# Patient Record
Sex: Male | Born: 1945
Health system: Southern US, Community
[De-identification: ages and names within clinical notes are randomized; demographics above are authoritative.]

## PROBLEM LIST (undated history)

## (undated) DIAGNOSIS — M1990 Unspecified osteoarthritis, unspecified site: Secondary | ICD-10-CM

## (undated) DIAGNOSIS — E785 Hyperlipidemia, unspecified: Secondary | ICD-10-CM

## (undated) DIAGNOSIS — C7800 Secondary malignant neoplasm of unspecified lung: Secondary | ICD-10-CM

## (undated) DIAGNOSIS — C8590 Non-Hodgkin lymphoma, unspecified, unspecified site: Principal | ICD-10-CM

## (undated) DIAGNOSIS — N400 Enlarged prostate without lower urinary tract symptoms: Secondary | ICD-10-CM

## (undated) DIAGNOSIS — H409 Unspecified glaucoma: Secondary | ICD-10-CM

## (undated) DIAGNOSIS — I309 Acute pericarditis, unspecified: Secondary | ICD-10-CM

## (undated) DIAGNOSIS — I471 Supraventricular tachycardia, unspecified: Secondary | ICD-10-CM

## (undated) DIAGNOSIS — K5792 Diverticulitis of intestine, part unspecified, without perforation or abscess without bleeding: Secondary | ICD-10-CM

## (undated) DIAGNOSIS — K219 Gastro-esophageal reflux disease without esophagitis: Secondary | ICD-10-CM

## (undated) DIAGNOSIS — C4492 Squamous cell carcinoma of skin, unspecified: Secondary | ICD-10-CM

## (undated) DIAGNOSIS — H269 Unspecified cataract: Secondary | ICD-10-CM

## (undated) DIAGNOSIS — C779 Secondary and unspecified malignant neoplasm of lymph node, unspecified: Secondary | ICD-10-CM

## (undated) DIAGNOSIS — J439 Emphysema, unspecified: Secondary | ICD-10-CM

## (undated) DIAGNOSIS — I499 Cardiac arrhythmia, unspecified: Secondary | ICD-10-CM

## (undated) DIAGNOSIS — C8510 Unspecified B-cell lymphoma, unspecified site: Secondary | ICD-10-CM

## (undated) DIAGNOSIS — M25559 Pain in unspecified hip: Secondary | ICD-10-CM

## (undated) DIAGNOSIS — I4891 Unspecified atrial fibrillation: Secondary | ICD-10-CM

## (undated) HISTORY — PX: CHOLECYSTECTOMY: SHX55

## (undated) HISTORY — DX: Squamous cell carcinoma of skin, unspecified: C44.92

## (undated) HISTORY — DX: Hyperlipidemia, unspecified: E78.5

## (undated) HISTORY — DX: Gastro-esophageal reflux disease without esophagitis: K21.9

## (undated) HISTORY — DX: Pain in unspecified hip: M25.559

## (undated) HISTORY — PX: EXPLORATORY LAPAROTOMY: SUR591

## (undated) HISTORY — DX: Emphysema, unspecified: J43.9

## (undated) HISTORY — PX: VEIN LIGATION AND STRIPPING: SHX2653

## (undated) HISTORY — PX: TONSILLECTOMY: SUR1361

## (undated) HISTORY — PX: LYMPH NODE BIOPSY: SHX201

## (undated) HISTORY — DX: Unspecified cataract: H26.9

## (undated) HISTORY — PX: LUNG LOBECTOMY: SHX167

## (undated) HISTORY — DX: Unspecified glaucoma: H40.9

## (undated) HISTORY — PX: PROSTATE SURGERY: SHX751

## (undated) HISTORY — DX: Unspecified B-cell lymphoma, unspecified site: C85.10

## (undated) HISTORY — PX: MENISCUS REPAIR: SHX5179

## (undated) HISTORY — DX: Benign prostatic hyperplasia without lower urinary tract symptoms: N40.0

## (undated) HISTORY — PX: REFRACTIVE SURGERY: SHX103

## (undated) HISTORY — DX: Diverticulitis of intestine, part unspecified, without perforation or abscess without bleeding: K57.92

## (undated) HISTORY — DX: Non-Hodgkin lymphoma, unspecified, unspecified site: C85.90

---

## 2003-11-22 ENCOUNTER — Ambulatory Visit (HOSPITAL_COMMUNITY): Admission: RE | Admit: 2003-11-22 | Discharge: 2003-11-22 | Payer: Self-pay | Admitting: Oncology

## 2003-11-22 IMAGING — PT NM PET TUM IMG SKULL BASE T - THIGH
1 of 3 series · 1 of 25 positions shown · non-contrast
Comparison: none

CLINICAL DATA: Lymphoma.  Please reevaluate.
FDG PET-CT TUMOR IMAGING (SKULL BASE TO THIGHS)
TECHNIQUE: 16 mCi F-18 FDG were administered via right antecubital fossa.  Full-ring PET imaging was performed from the skull base through the mid-thighs 55 minutes after injection.  CT data was obtained and used for attenuation correction and anatomic localization only.  (This was not acquired as a diagnostic CT examination.)

[Series 2: ct images · axial · 3.8mm · 0.98mm/px · 1 of 267 slices shown]
[im 267/267  brain]
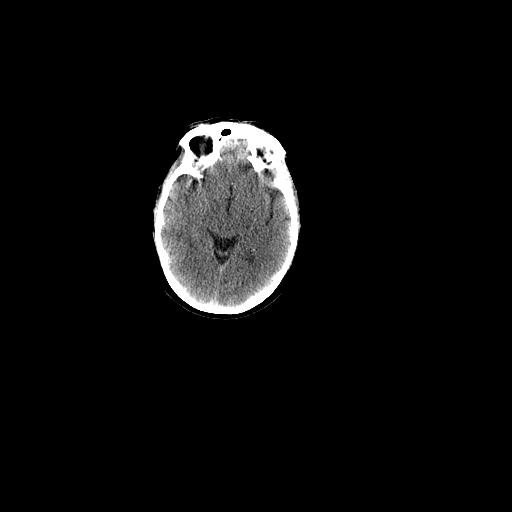

[1 of 25 positions shown; findings below may reference images not displayed]

FINDINGS: There is a focal area of increased FDG uptake seen associated with the left parotid gland within its medial posterior portion (axial image #20).  This is associated with a maximum SUV of 9.3 g/mL.  This may be secondary to an intraparotid lymph node with lymphomatous involvement or could be secondary to a primary parotid tumor.  Intraparotid lymph nodes are not uncommon, and given the patient?s history I would favor a lymphoma associated with an intraparotid lymph node.  There is increased FDG uptake within enlarged left axillary lymph nodes and also a focal area of increased FDG uptake within the posterolateral right chest (image #126).  This is in the area of the posterolateral right ninth rib and most likely represents  involvement of the rib (if the patient has no history of recent trauma in this region).  There is a hiatal hernia noted.   There is also increased FDG uptake within preaortic, retroperitoneal, bilateral common iliac, left external iliac, and left inguinal lymph nodes.  These are all consistent with lymphoma.  The largest lymph nodes are within the left inguinal region with a maximum SUV of 20 g/mL.  The retroperitoneal lymph nodes are associated with an SUV of approximately 7 g/mL.  The iliac chain lymph nodes are associated with an SUV of 5 g/mL.
IMPRESSION: 1.  Areas of increased FDG uptake associated with the left axillary, retroperitoneal, bilateral iliac chain, and left inguinal lymph nodes consistent with lymphoma.  
2.  Focal area of increased uptake associated with the left parotid gland which may be secondary to lymphoma focally involving an intraparotid lymph node or a primary parotid tumor.
3.  Focally increased FDG uptake associated with right posterolateral chest in the area of the right ninth rib.  If the patient has no history of trauma in this region, most likely this represents lymphomatous involvement of the rib.

## 2003-11-22 IMAGING — CT CT ABDOMEN W/ CM
1 of 4 series · 12 of 32 positions shown, 17 images · IV contrast (omnipaque)
Comparison: none

CLINICAL DATA: Lymphoma.
CT CHEST WITH IV CONTRAST:
Multidetector helical study performed during IV contrast enhancement with 150 cc of Omnipaque 300.  
There are multiple mildly enlarged left axillary lymph nodes present.  The largest lymph node measures 21 x 12 mm in size on image #15 and corresponds to the area of increased FDG uptake noted on the recent PET scan.  There is a slightly prominent aorticopulmonary window mediastinal lymph node which measures 15 x 10 mm in size on image #27.  This is not associated with abnormal FDG uptake on the recent PET scan.  There are no parenchymal pulmonary nodules or infiltrates, and there are no pleural effusions.  The abnormal FDG uptake associated with the posterolateral right chest is not associated with a CT scan abnormality in this region.  There is a moderate-sized hiatal hernia noted.  There is a 21 mm in size low density structure seen projecting to the right of the lower thoracic spine at the T10-11 level.  This measures 15 + / -8 Hounsfield units in density (water range).  This is unchanged from the previous CT performed at [HOSPITAL] [HOSPITAL] [DATE] and would be most consistent with an incidental lateral meningocele.  This is not associated with abnormal FDG uptake.

[Series 2: cap w/iv 5.0 b30f · axial · 0.74mm/px · z∈[+1271,+1876]mm · 12 of 139 slices shown, 17 images]
[im 9/139  soft-tissue]
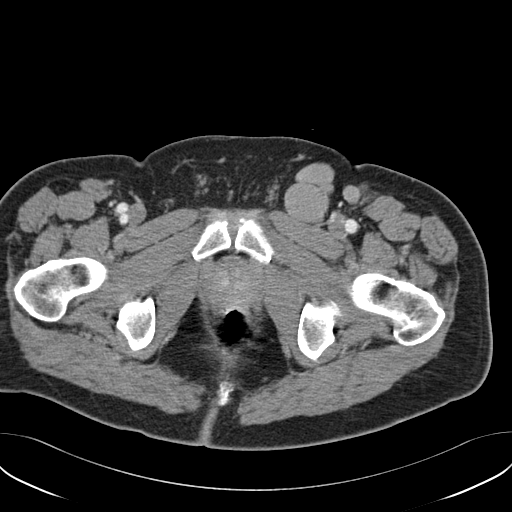
[im 9/139  bone]
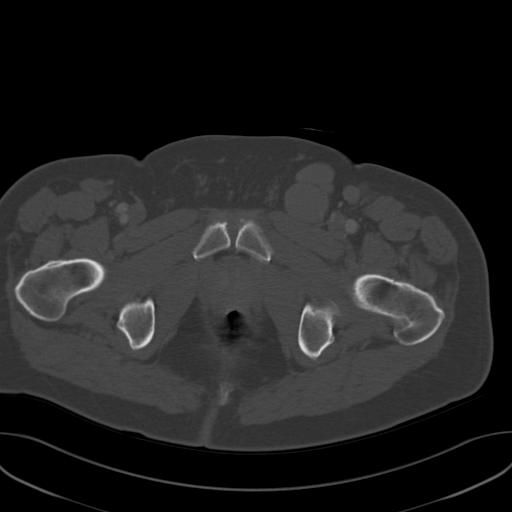
[im 26/139  soft-tissue]
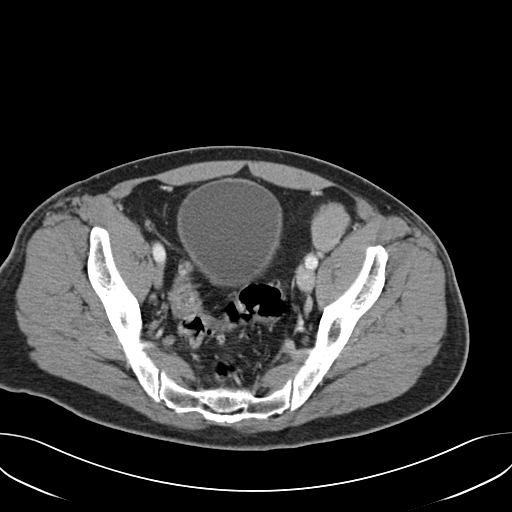
[im 35/139  soft-tissue]
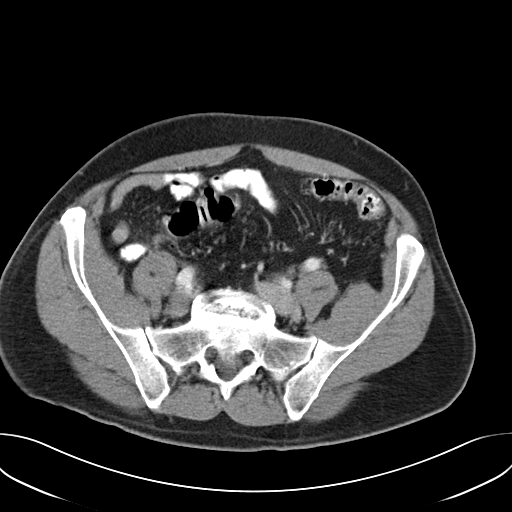
[im 44/139  soft-tissue]
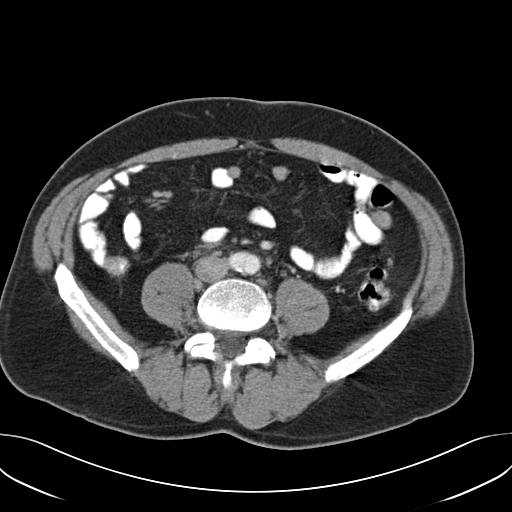
[im 61/139  soft-tissue]
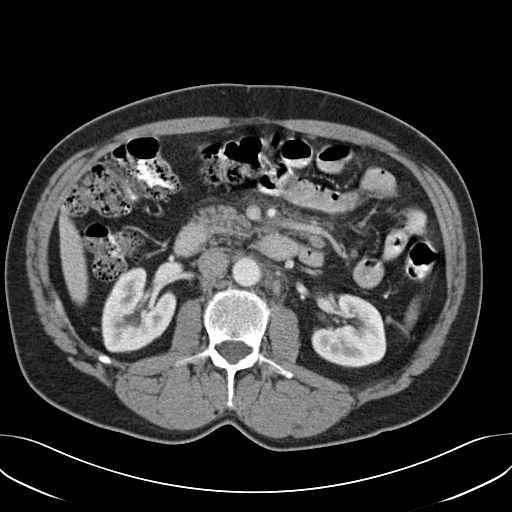
[im 70/139  soft-tissue]
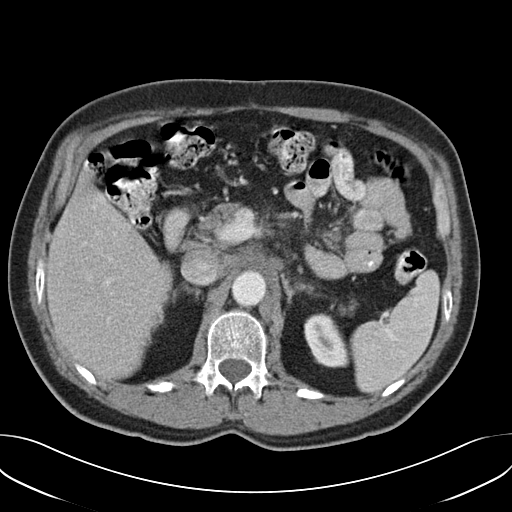
[im 78/139  soft-tissue]
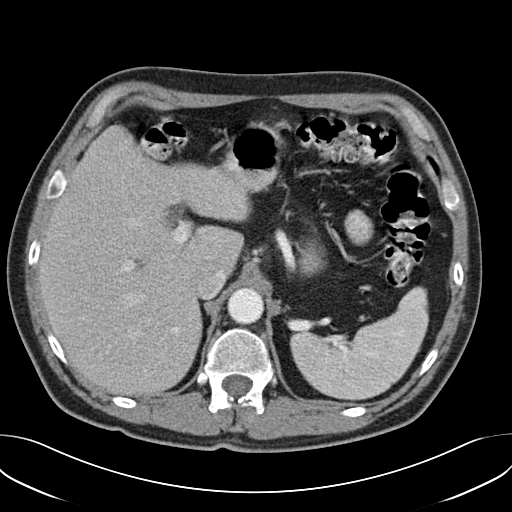
[im 95/139  soft-tissue]
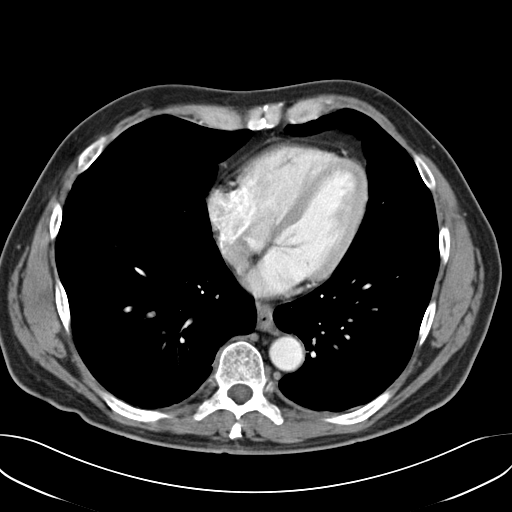
[im 104/139  soft-tissue]
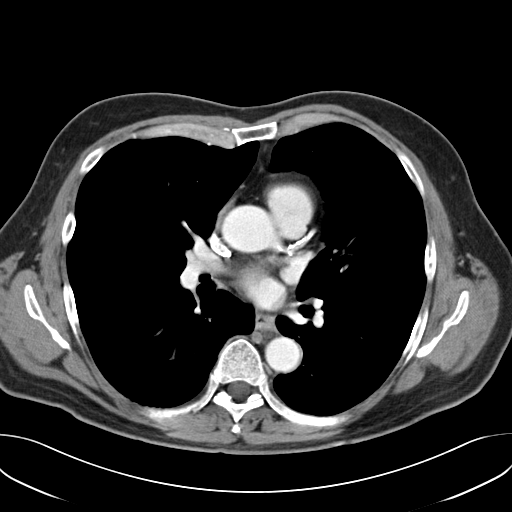
[im 104/139  lung]
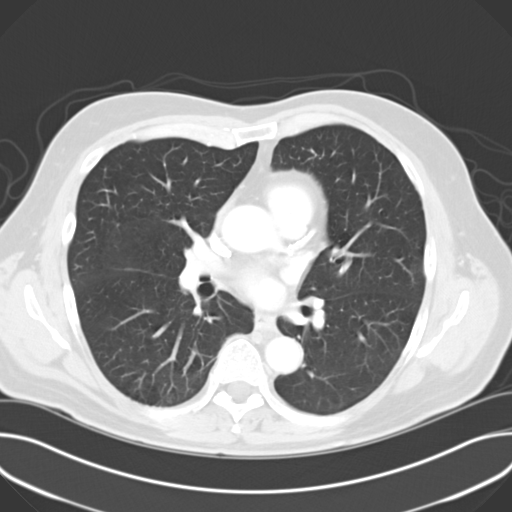
[im 104/139  bone]
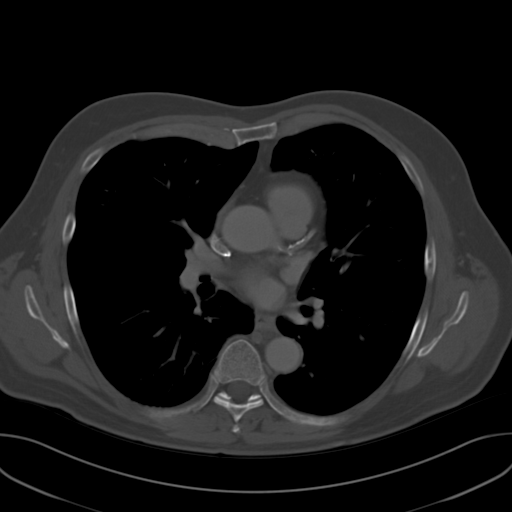
[im 113/139  soft-tissue]
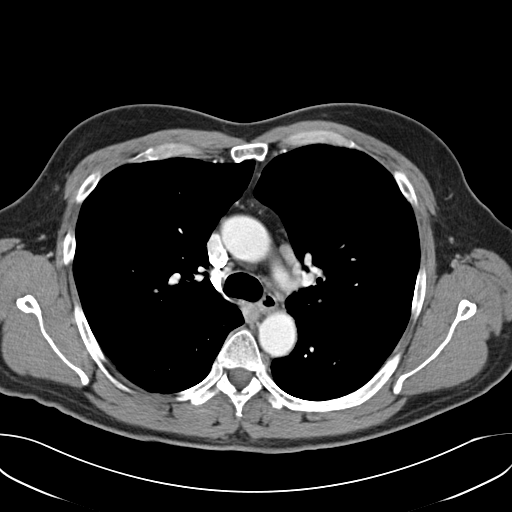
[im 113/139  lung]
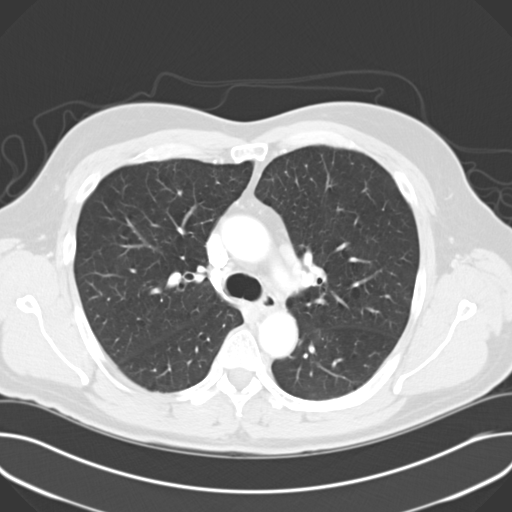
[im 121/139  lung]
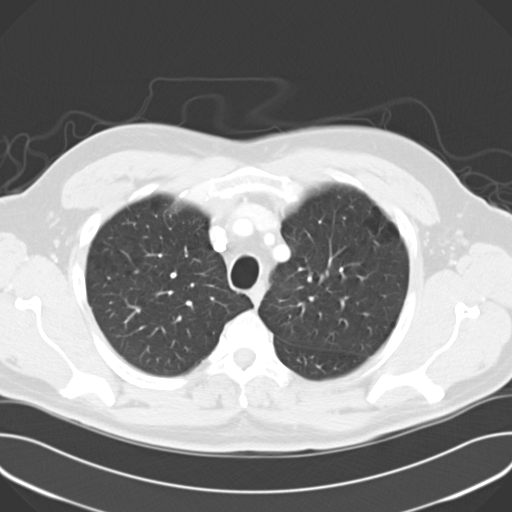
[im 130/139  soft-tissue]
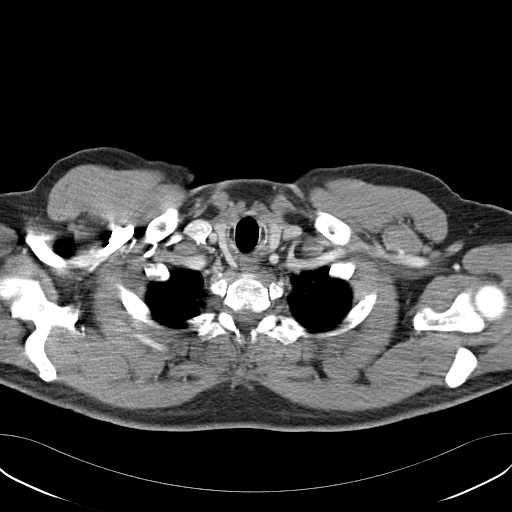
[im 130/139  lung]
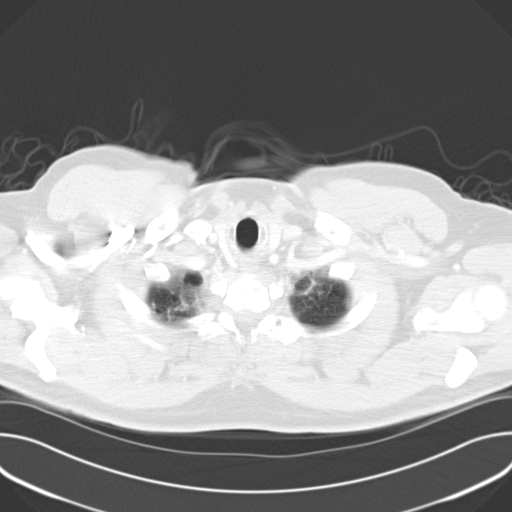

[12 of 32 positions shown; findings below may reference images not displayed]

IMPRESSION: 1.  Enlarged left axillary lymph nodes with FDG activity noted on the recent PET scan consistent with lymphoma.  Mildly prominent aorticopulmonary window mediastinal lymph node is not associated with abnormal FDG uptake.  
2.  Hiatal hernia. 
3.  21 mm in size water-density structure projecting to the right of the lower thoracic spine most consistent with an incidental lateral meningocele.  
CT ABDOMEN WITH IV CONTRAST:
There is a 2.6 x 3.5 cm in size soft tissue mass seen anterior to the aorta and located inferior to the celiac artery.  In correlating this with the PET scan, this area does not avidly take up the FDG, and this has a stable appearance when compared with the [DATE] CT from [HOSPITAL].  There is FDG activity present associated with a lymph node interposed between the inferior vena cava and portal vein (periportal lymph node).  This blends with the medial border of the inferior vena cava, but measures approximately 16 mm in AP dimension.  There are several small cysts seen associated with the left lobe of the liver, and the liver otherwise has a normal appearance.  The spleen, pancreas, kidneys, and adrenal glands have a normal appearance.  There are prominent retroperitoneal lymph nodes present with ill-definition of the retroperitoneal fat.  However, these areas are relatively stable in appearance when compared with the prior study from [HOSPITAL].
IMPRESSION: 1.  Soft tissue density interposed between the portal vein and inferior vena cava and slightly medial inferior vena cava consistent with recurrent adenopathy in this region (when correlated with the recent PET scan).  Soft tissue density seen anterior to the aorta and inferior to the celiac artery appears relatively stable when compared with the prior CT scan.  Also, the retroperitoneal adenopathy and ill-definition of the retroperitoneal fat is of similar appearance.  
2.  Hiatal hernia.
CT PELVIS WITH IV CONTRAST:
There are multiple markedly enlarged left inguinal lymph nodes.  These correspond to the areas of intense FDG uptake noted on the recent PET scan.  This is associated as well with left external iliac adenopathy, and there are mildly enlarged lymph nodes seen adjacent to the common iliac arteries bilaterally.  Two enlarged lymph nodes blend with one another within the left inguinal region anteriorly, and on image #126 this area of adenopathy measures 5 cm in transverse dimension and 3.1 cm in AP dimension.  This does represent a significant interval worsening when compared with the prior study from [HOSPITAL].  A 1.4 cm in diameter left common iliac chain lymph node is noted which is associated with increased FDG uptake on the PET scan.
IMPRESSION: Bilateral common iliac, left external iliac, and left inguinal adenopathy with progression when compared with the previous CT scan from [HOSPITAL].

## 2003-12-10 ENCOUNTER — Encounter (INDEPENDENT_AMBULATORY_CARE_PROVIDER_SITE_OTHER): Payer: Self-pay | Admitting: General Surgery

## 2003-12-10 ENCOUNTER — Ambulatory Visit (HOSPITAL_COMMUNITY): Admission: RE | Admit: 2003-12-10 | Discharge: 2003-12-10 | Payer: Self-pay | Admitting: General Surgery

## 2003-12-10 ENCOUNTER — Encounter (INDEPENDENT_AMBULATORY_CARE_PROVIDER_SITE_OTHER): Payer: Self-pay | Admitting: Specialist

## 2003-12-19 ENCOUNTER — Ambulatory Visit (HOSPITAL_COMMUNITY): Admission: RE | Admit: 2003-12-19 | Discharge: 2003-12-19 | Payer: Self-pay | Admitting: Oncology

## 2003-12-31 ENCOUNTER — Ambulatory Visit (HOSPITAL_COMMUNITY): Admission: RE | Admit: 2003-12-31 | Discharge: 2003-12-31 | Payer: Self-pay | Admitting: Oncology

## 2003-12-31 IMAGING — XA IR CV CATH FLUORO GUIDE
1 series · 2 of 2 positions shown · non-contrast
Comparison: none

CLINICAL DATA: 58 year-old male with lymphoma
ULTRASOUND AND FLUOROSCOPICALLY GUIDED RIGHT IJ SINGLE LUMEN PORT-A-CATHETER:
Radiologist:  HANNON, M.D.
Guidance:  ultrasound and fluoroscopic
Complications:  no immediate complications
Medications:  1g Ancef intravenous, Versed and fentanyl for IV sedation
Procedure/Findings:
Informed consent was obtained from the patient.  
Under sterile conditions and local anesthesia, the right internal jugular vein was visualized with ultrasound.  Micropuncture needle access was performed.  An 018 guidewire was advanced centrally.  A 4 French dilator was advanced.  This allowed up size of the guidewire to a Bentson guidewire advanced into the IVC utilizing a 6 French MPA  Catheter.  Through the catheter, measurements were obtained from the SVC/RA junction back to the venotomy site with the guidewire.  The catheter was flushed with saline.  The ipsilateral infraclavicular chest, a subcutaneous pocket was created over the right second anterior rib.  To perform this, a 2.5cm incision was performed with a #15 scalpel blade.  Blunt dissection was performed to create the subcutaneous pocket overlying the pectoralis major muscle.  This was performed under local anesthesia with 1% lidocaine with Epinephrine.  The pocket was sterilely flushed vigorously with normal saline.  There was adequate hemostasis.  The Port-A-Catheter was assembled and checked for leakage.  The catheter was pulled from the subcutaneous pocket back to the venotomy site and inserted into the SVC/RA junction through a Peel-Away sheath.  Position was confirmed fluoroscopically.  The catheter flushed and aspirated well and was finally flushed with 500 units of heparin.  The Port-A-Catheter pocket was closed in a two-layer fashion with 4-0 Vicryl sutures and superficial Dermabond was applied.  The venotomy site was closed in a similar fashion.  A dry sterile dressing was applied.

[Series 1000: run · 0.14mm/px · 2 of 2 slices shown]
[im 1/2]
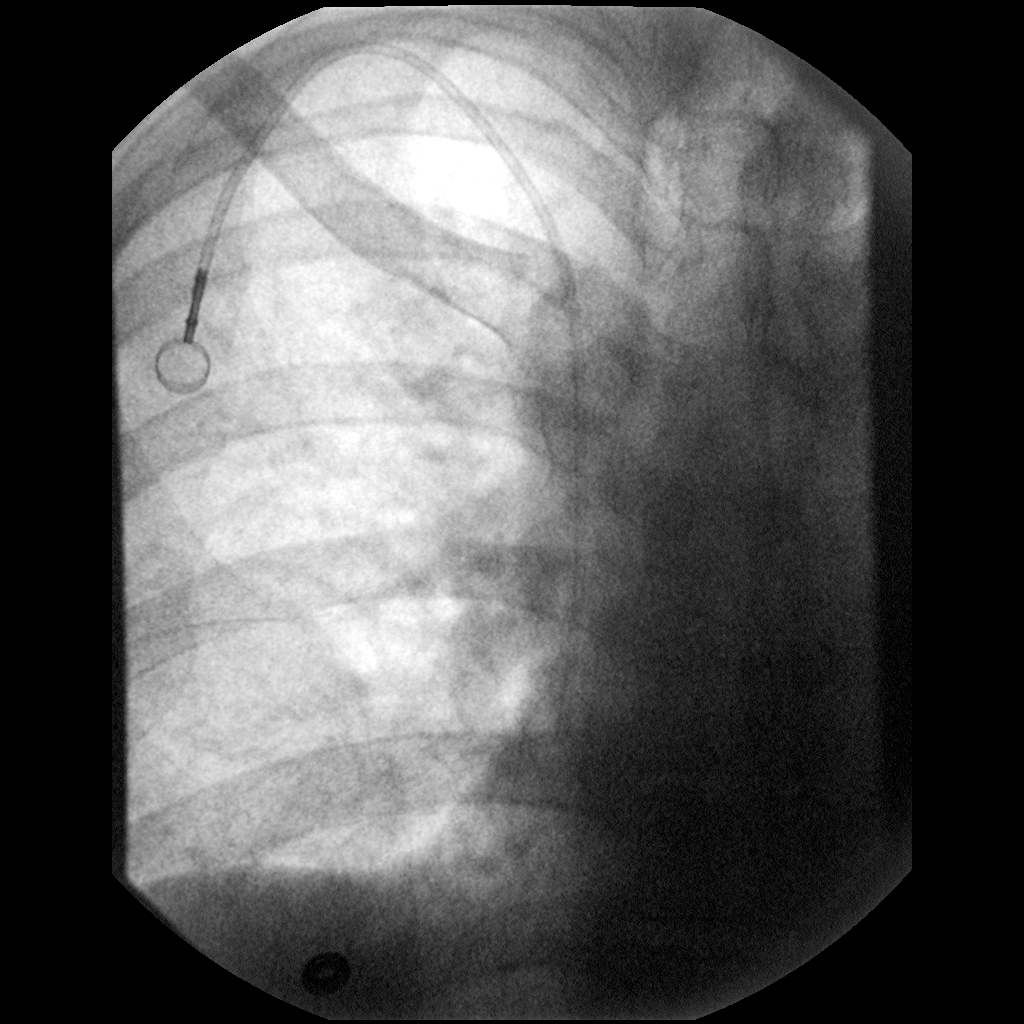
[im 2/2]
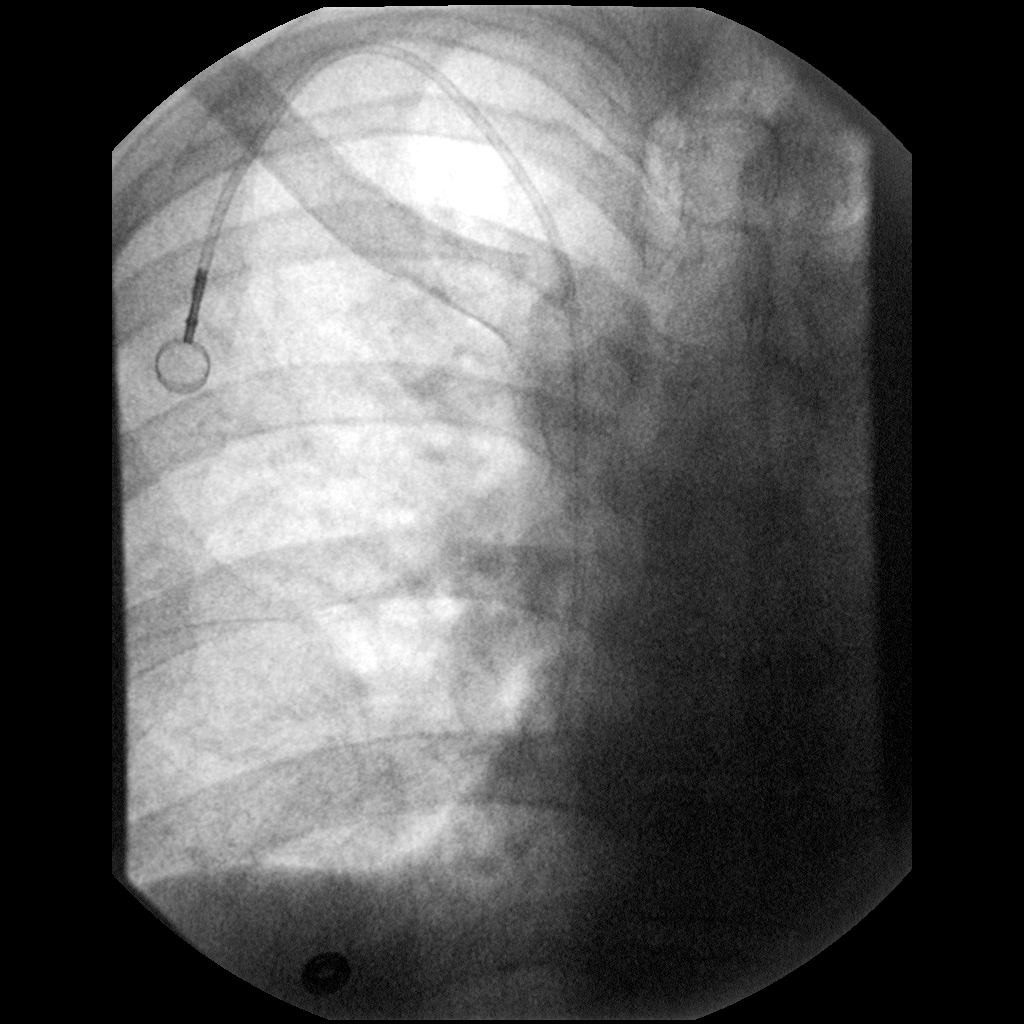

[2 of 2 positions shown; findings below may reference images not displayed]

IMPRESSION: 1  Ultrasound and fluoroscopically guided right IJ single lumen Port-A-Catheter with the tip in the SVC/RA junction ready for use.

## 2003-12-31 IMAGING — NM NM CARDIA MUGA REST
3 series · 18 of 18 positions shown · non-contrast
Comparison: None

CLINICAL DATA: Lymphoma

NM CARDIAC BLOOD POOL IMAGING (MUGA)
TECHNIQUE: Cardiac multi-gated acquisition was performed at rest following
intravenous injection of radiopharmaceutical. 
Radiopharmaceutical:  25.6 mCi [6N] in-vitro labeled red cells

[Series 1: mu muga · 4.34mm/px · 6 of 16 frames shown (1 of 3)]
[frame 2/16]
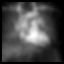
[frame 4/16]
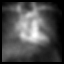
[frame 7/16]
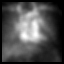
[frame 10/16]
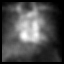
[frame 12/16]
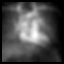
[frame 15/16]
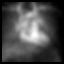

[Series 1: mu muga · 4.34mm/px · 6 of 16 frames shown (2 of 3)]
[frame 2/16]
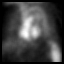
[frame 4/16]
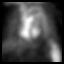
[frame 7/16]
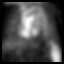
[frame 10/16]
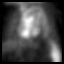
[frame 12/16]
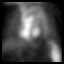
[frame 15/16]
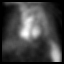

[Series 1: mu muga · 4.34mm/px · 6 of 16 frames shown (3 of 3)]
[frame 2/16]
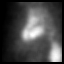
[frame 4/16]
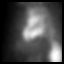
[frame 7/16]
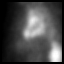
[frame 10/16]
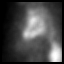
[frame 12/16]
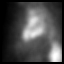
[frame 15/16]
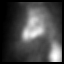

[18 of 18 positions shown; findings below may reference images not displayed]

FINDINGS: The left ventricular ejection fraction is estimated by one
technologist at 61% and a second technologist at 62%. Wall motion appears normal
on the cine loop. 

IMPRESSION

Left ventricular ejection fraction approximately 62%

## 2003-12-31 IMAGING — US IR CV CATH FLUORO GUIDE
1 series · 2 of 2 positions shown · non-contrast
Comparison: none

CLINICAL DATA: 58 year-old male with lymphoma
ULTRASOUND AND FLUOROSCOPICALLY GUIDED RIGHT IJ SINGLE LUMEN PORT-A-CATHETER:
Radiologist:  HANNON, M.D.
Guidance:  ultrasound and fluoroscopic
Complications:  no immediate complications
Medications:  1g Ancef intravenous, Versed and fentanyl for IV sedation
Procedure/Findings:
Informed consent was obtained from the patient.  
Under sterile conditions and local anesthesia, the right internal jugular vein was visualized with ultrasound.  Micropuncture needle access was performed.  An 018 guidewire was advanced centrally.  A 4 French dilator was advanced.  This allowed up size of the guidewire to a Bentson guidewire advanced into the IVC utilizing a 6 French MPA  Catheter.  Through the catheter, measurements were obtained from the SVC/RA junction back to the venotomy site with the guidewire.  The catheter was flushed with saline.  The ipsilateral infraclavicular chest, a subcutaneous pocket was created over the right second anterior rib.  To perform this, a 2.5cm incision was performed with a #15 scalpel blade.  Blunt dissection was performed to create the subcutaneous pocket overlying the pectoralis major muscle.  This was performed under local anesthesia with 1% lidocaine with Epinephrine.  The pocket was sterilely flushed vigorously with normal saline.  There was adequate hemostasis.  The Port-A-Catheter was assembled and checked for leakage.  The catheter was pulled from the subcutaneous pocket back to the venotomy site and inserted into the SVC/RA junction through a Peel-Away sheath.  Position was confirmed fluoroscopically.  The catheter flushed and aspirated well and was finally flushed with 500 units of heparin.  The Port-A-Catheter pocket was closed in a two-layer fashion with 4-0 Vicryl sutures and superficial Dermabond was applied.  The venotomy site was closed in a similar fashion.  A dry sterile dressing was applied.

[Series 1: sp us guide vasc access*right* · 2 of 2 slices shown]
[im 1/2]
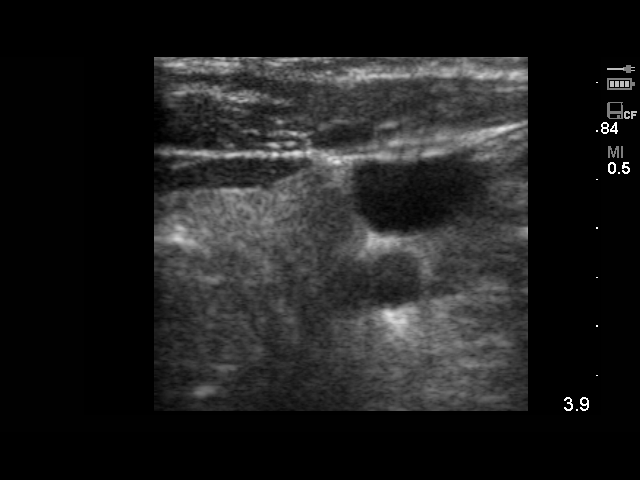
[im 2/2]
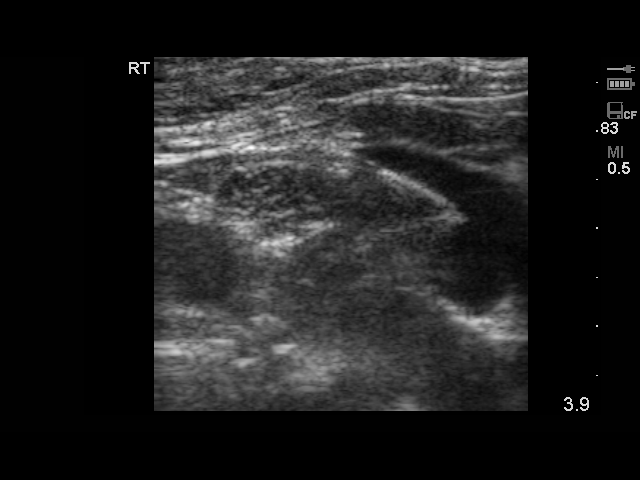

[2 of 2 positions shown; findings below may reference images not displayed]

IMPRESSION: 1  Ultrasound and fluoroscopically guided right IJ single lumen Port-A-Catheter with the tip in the SVC/RA junction ready for use.

## 2004-02-20 ENCOUNTER — Ambulatory Visit (HOSPITAL_COMMUNITY): Admission: RE | Admit: 2004-02-20 | Discharge: 2004-02-20 | Payer: Self-pay | Admitting: Oncology

## 2004-02-20 IMAGING — CT NM PET TUM IMG SKULL BASE T - THIGH
3 series · 25 of 25 positions shown · IV contrast ([ID])
Comparison: Previous PET scan from [DATE].
 FDG PET-CT TUMOR IMAGING (SKULL BASE TO THIGHS):

 Fasting Blood Glucose:  122.

CLINICAL DATA: Patient has a history of lymphoma.
TECHNIQUE: 16.0 mCi F-18 FDG were administered via the right antecubital fossa.  Full ring PET imaging was performed from the skull base through the mid-thighs 77 minutes after injection.  CT data was obtained and used for attenuation correction and anatomic localization only.  (This was not acquired as a diagnostic CT examination.)

[Series 1: pet ac · axial · 3.3mm · 4.69mm/px · z∈[-874,-4]mm · 9 of 267 slices shown]
[im 1/267]
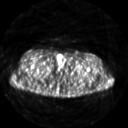
[im 34/267]
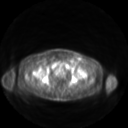
[im 67/267]
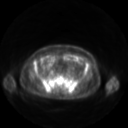
[im 100/267]
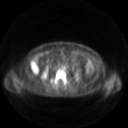
[im 134/267]
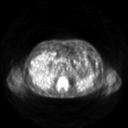
[im 167/267]
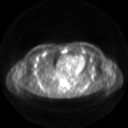
[im 200/267]
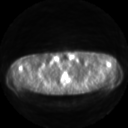
[im 233/267]
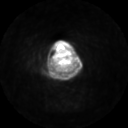
[im 267/267]
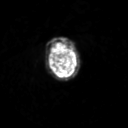

[Series 2: ct images · axial · 3.8mm · 0.98mm/px · z∈[-874,-4]mm · 8 of 267 slices shown]
[im 1/267]
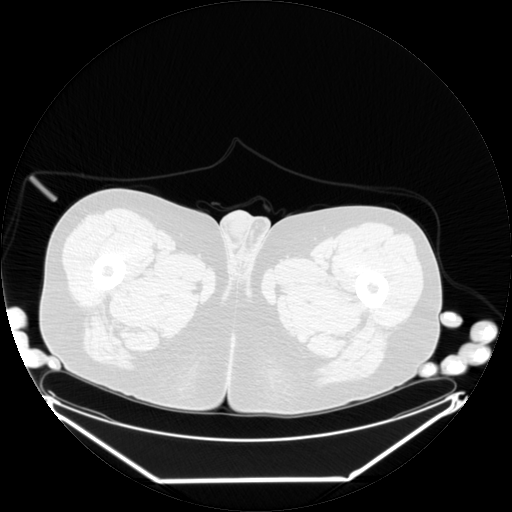
[im 39/267]
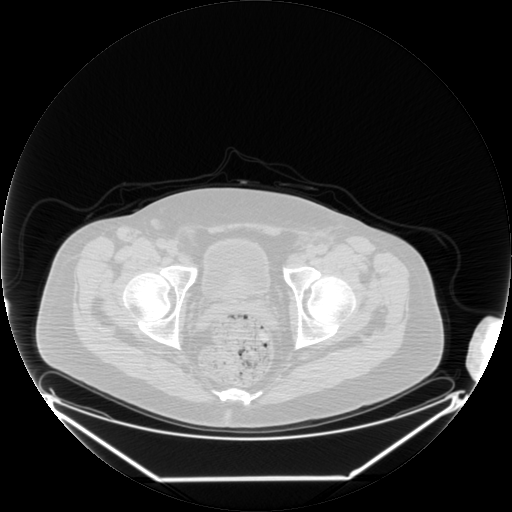
[im 77/267]
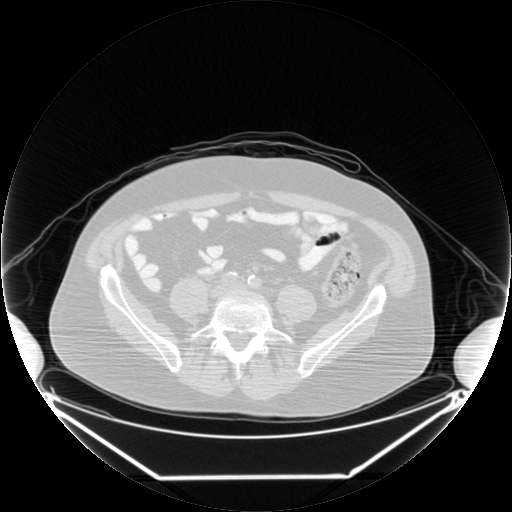
[im 115/267]
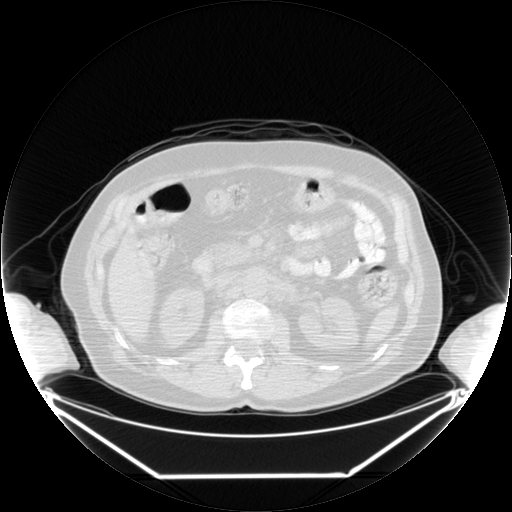
[im 153/267]
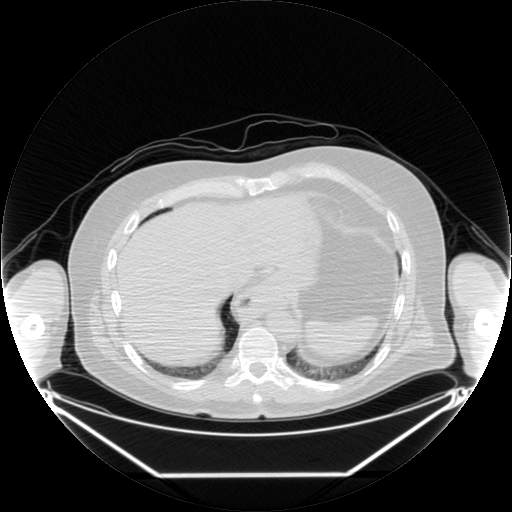
[im 191/267]
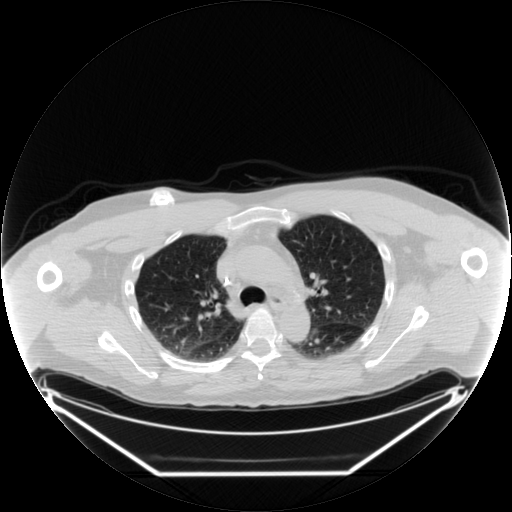
[im 229/267  full-range]
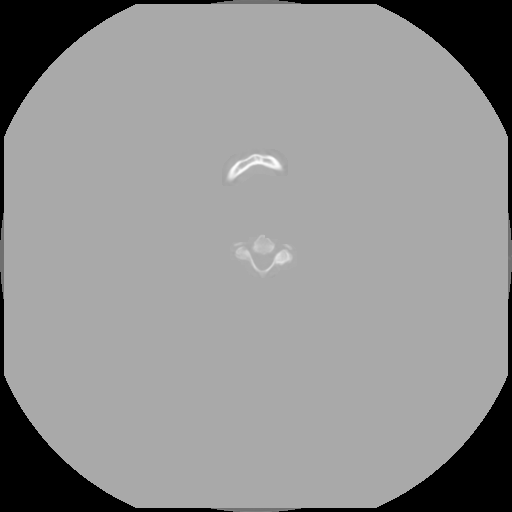
[im 267/267  brain]
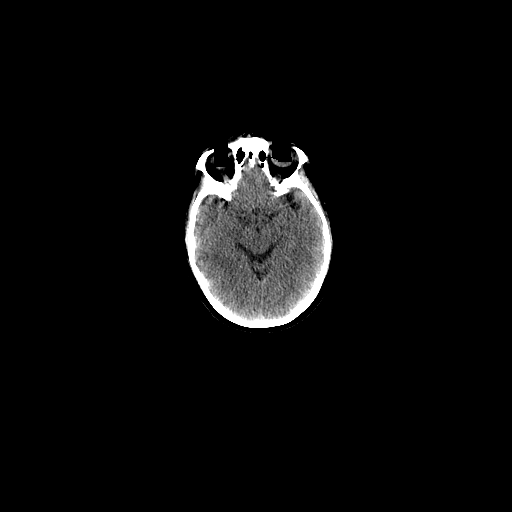

[Series 2: pet nac · axial · 3.3mm · 4.69mm/px · z∈[-874,-4]mm · 8 of 267 slices shown]
[im 1/267]
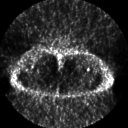
[im 39/267]
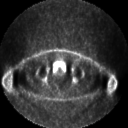
[im 77/267]
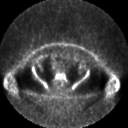
[im 115/267]
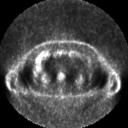
[im 153/267]
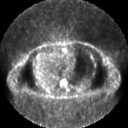
[im 191/267]
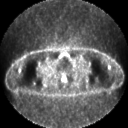
[im 229/267]
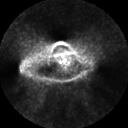
[im 267/267]
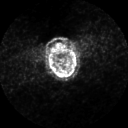

[25 of 25 positions shown; findings below may reference images not displayed]

FINDINGS: The previously questioned area of abnormality in the left parotid gland now reveals no definite abnormality.  There has been interval resolution of the previously noted abnormal activity in the left axilla.  No chest abnormality.  
 In the abdomen, there has also been resolution of the previously noted abnormal activity in the retroperitoneal area and periaortic region.  
 In the pelvis, there is minimal persistent increase in activity corresponding to the previous areas of marked abnormality in the left iliac chain.  SUV in this area now is 4.1.  There has also been marked improvement in the abnormality in the left inguinal area with mild increase in activity with an SUV of 3.5.  There has been interval decrease in size of the nodes.  

There is noted to be diffuse increase in activity throughout the bone marrow compatible with recent chemotherapy.
IMPRESSION: Marked interval improvement in the previously noted areas of abnormal nodal activity with resolution of the abnormality in the left axilla and upper abdomen.  Mild persistent activity remains in the left iliac chain nodes as well as the left inguinal nodes.  There is also noted to be interval resolution of the previously noted focal abnormality in the region of the right posterior rib.  
 Diffuse bone marrow activity compatible with recent chemotherapy.

## 2004-02-20 IMAGING — CT CT CHEST W/ CM
1 of 4 series · 14 of 32 positions shown, 19 images · IV contrast (omnipaque)
Comparison: [DATE].

CLINICAL DATA: Lymphoma--restaging.
TECHNIQUE: Routine multidetector helical imaging using oral and IV [1Z] cc Omnipaque 300.

[Series 2: cap w/iv 5.0 b30f · axial · 0.74mm/px · z∈[-661,-61]mm · 14 of 138 slices shown, 19 images]
[im 9/138  soft-tissue]
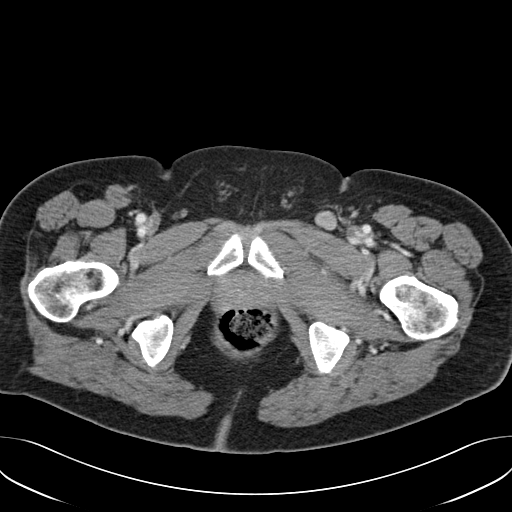
[im 9/138  bone]
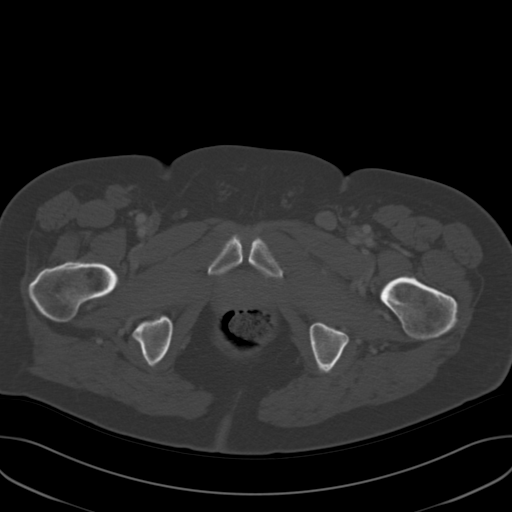
[im 18/138  soft-tissue]
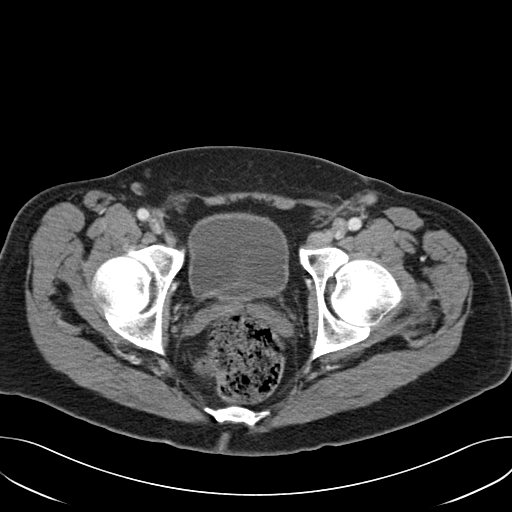
[im 26/138  soft-tissue]
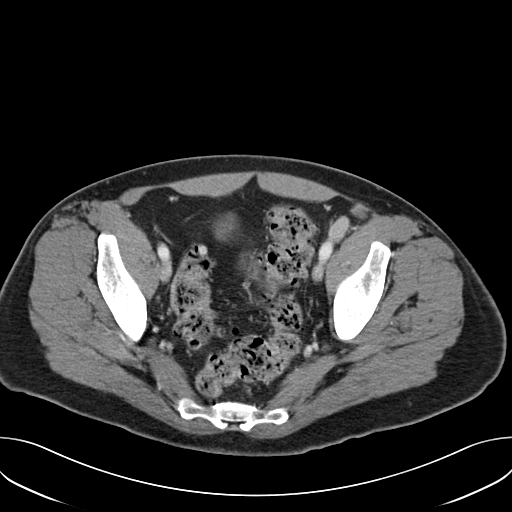
[im 43/138  soft-tissue]
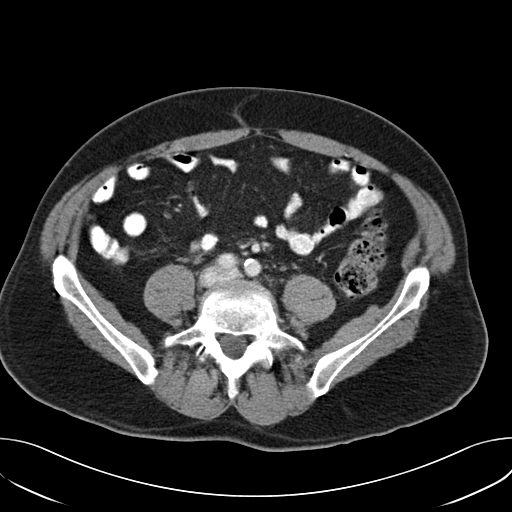
[im 52/138  soft-tissue]
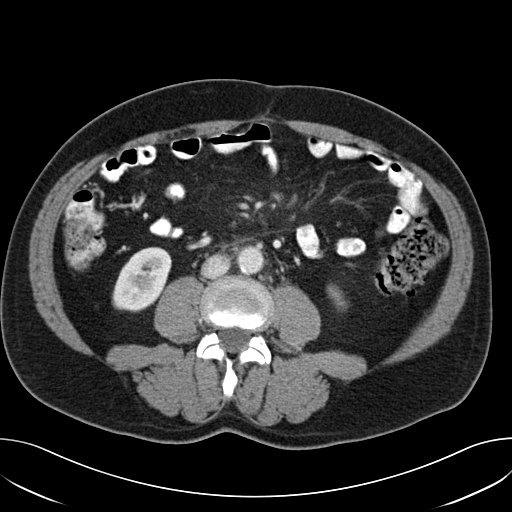
[im 60/138  soft-tissue]
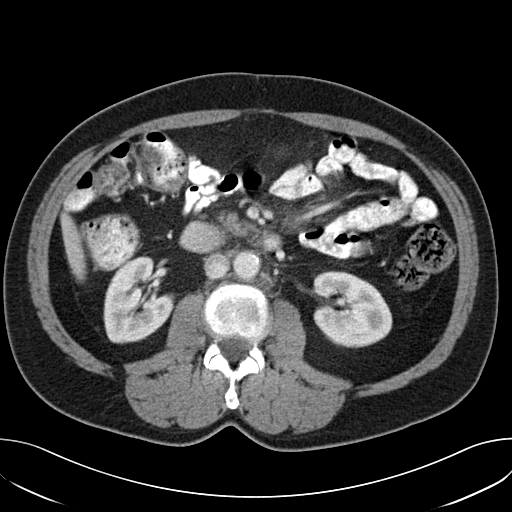
[im 69/138  soft-tissue]
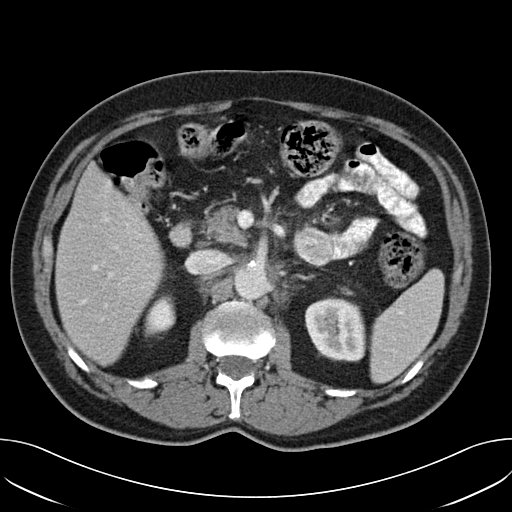
[im 78/138  soft-tissue]
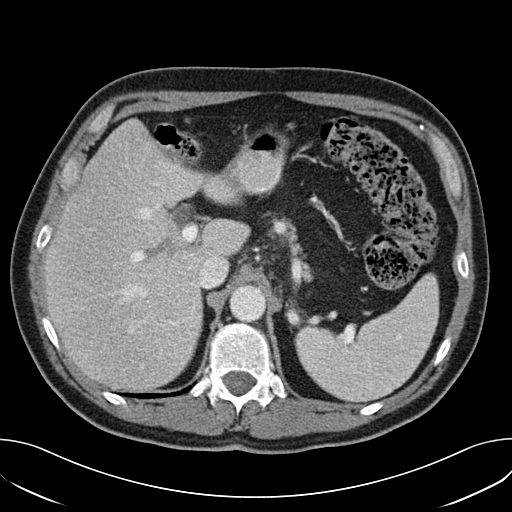
[im 86/138  soft-tissue]
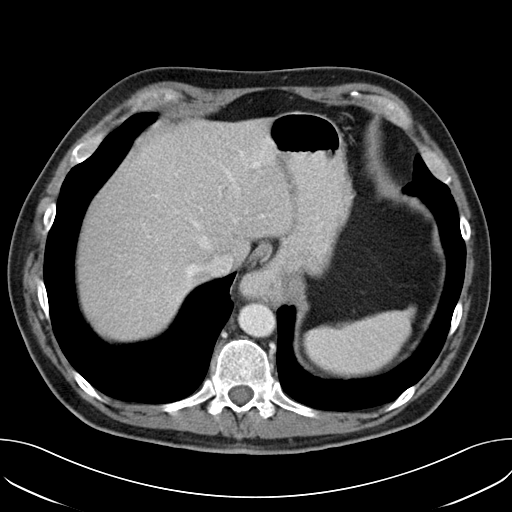
[im 86/138  bone]
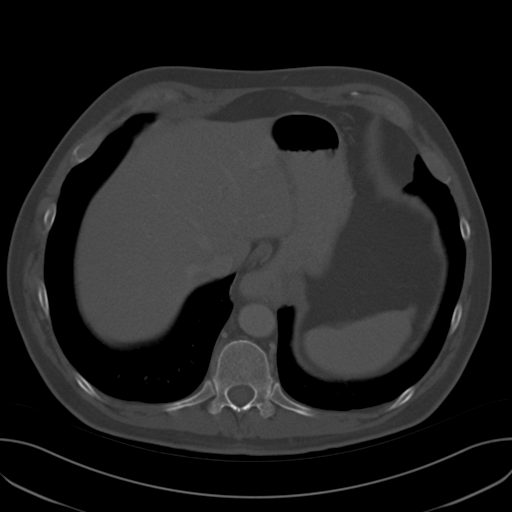
[im 95/138  soft-tissue]
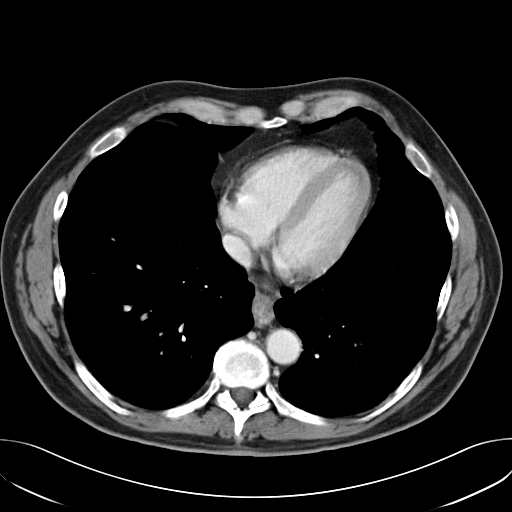
[im 103/138  lung]
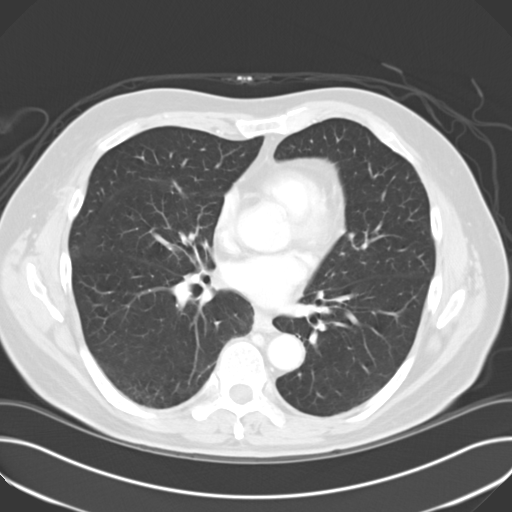
[im 112/138  soft-tissue]
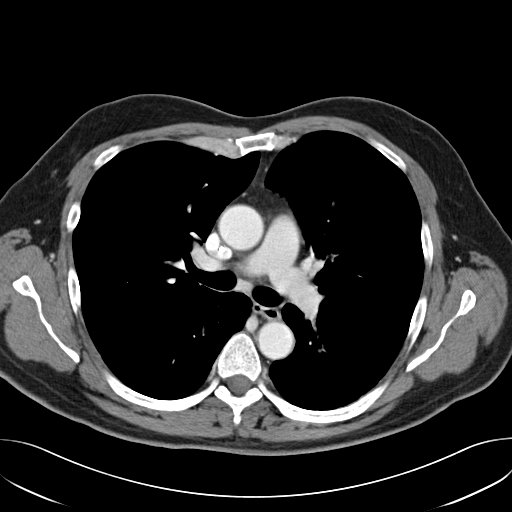
[im 112/138  lung]
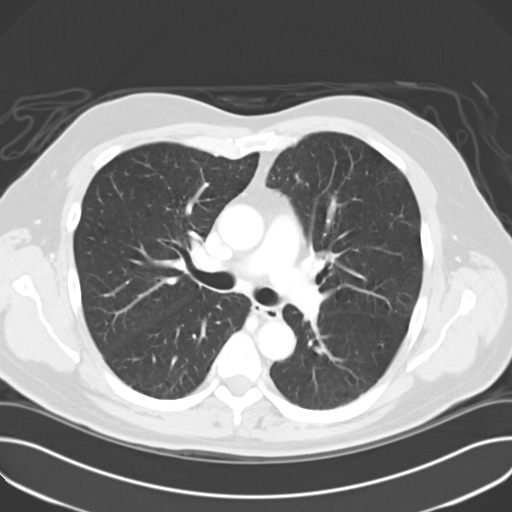
[im 120/138  soft-tissue]
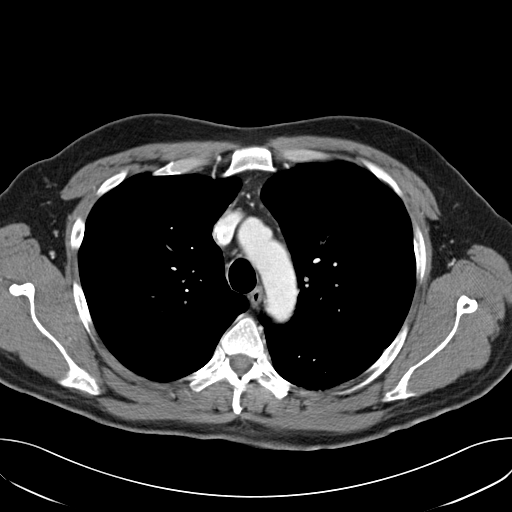
[im 120/138  lung]
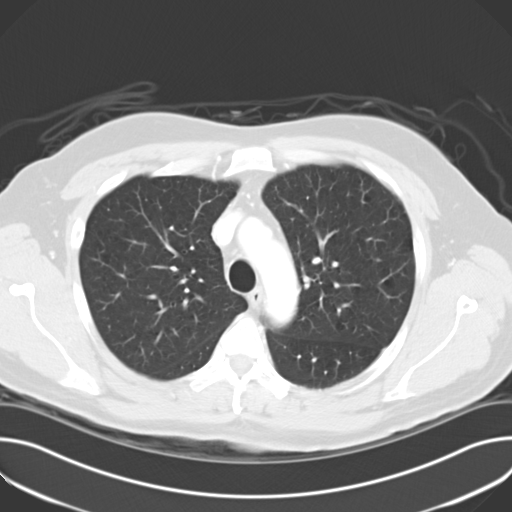
[im 129/138  soft-tissue]
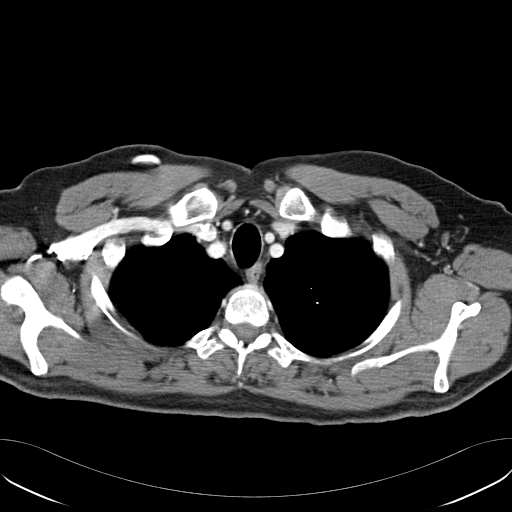
[im 129/138  lung]
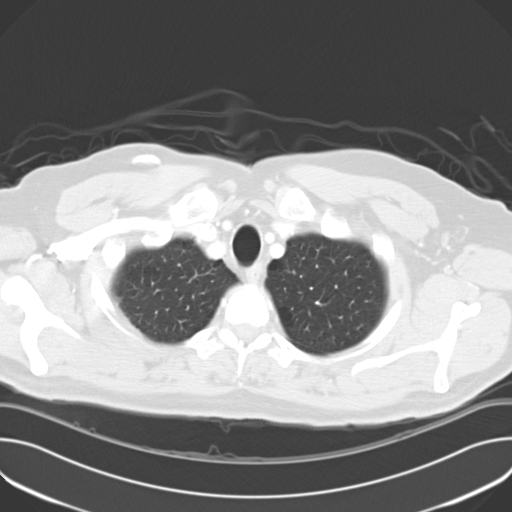

[14 of 32 positions shown; findings below may reference images not displayed]

CT CHEST WITH CONTRAST:
 No pulmonary nodules or active appearing infiltrates.  There are some changes of COPD with some blebs at the left posterior lung base.  Enlarged left axillary lymph nodes have resolved.  The mediastinal node in the AP window currently measures 14 x 6 mm.  It is slightly smaller.  No other mediastinal nodes.  No pleural or pericardial fluid.  The incidental meningocele in the right paraspinous region is unchanged.  Small hiatal hernia again noted.
IMPRESSION: 1.  Resolution of left axillary adenopathy.
 2.  AP window node smaller.
 3.  No new findings.
 CT ABDOMEN WITH CONTRAST:
 The periportal lymph node previously noted has resolved.  Adenopathy in the preaortic region, just inferior to the celiac trunk, is about the same to perhaps minimally smaller.  Apparently, this area was not FDG avid on a recent PET scan.  Liver and spleen unremarkable.  Adrenals normal.  No renal pathology of significance.
IMPRESSION: 1.  Periportal adenopathy, resolved.
 2.  Preaortic adenopathy inferior to the celiac axis, stable to slightly smaller--see report.
 3.  No new findings.
 CT PELVIS WITH CONTRAST:
 Significant interval decrease in left iliac and left inguinal adenopathy.  The largest left iliac node is seen on image #12, measuring 23 x 14 mm.  The largest inguinal node, seen on image #126, is 21 x 17 mm.  No new adenopathy.  No other findings of significance.
IMPRESSION: Marked interval improvement in left iliac and inguinal adenopathy.

## 2004-04-27 ENCOUNTER — Ambulatory Visit (HOSPITAL_COMMUNITY): Admission: RE | Admit: 2004-04-27 | Discharge: 2004-04-27 | Payer: Self-pay | Admitting: Oncology

## 2004-04-27 IMAGING — CT CT ABDOMEN W/ CM
1 of 3 series · 13 of 32 positions shown, 18 images · IV contrast (agent unspecified)
Comparison: [DATE].

CLINICAL DATA: Lymphoma. 
CT CHEST, ABDOMEN AND PELVIS WITH CONTRAST MATERIAL:
TECHNIQUE: Contiguous 5 mm axial images of the chest, abdomen and pelvis were obtained after oral and [02] of nonionic intravenous contrast.  
CT CHEST WITH CONTRAST:
Right-sided port-a-cath remains in place.  No lymphadenopathy.  Heart size within normal limits.  No pericardial or pleural effusion.  
Fluid density mass in the right neural foramen at T10-11 and in the bilateral foramina at T11-12 and left neural foramen at T12-L1 are stable.

[Series 2: cap_xxl 5.0 b40s st · axial · 0.88mm/px · z∈[-606,+4]mm · 13 of 138 slices shown, 18 images]
[im 8/138  soft-tissue]
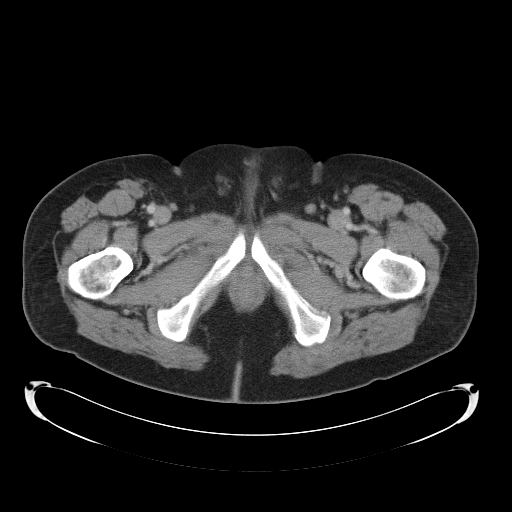
[im 8/138  bone]
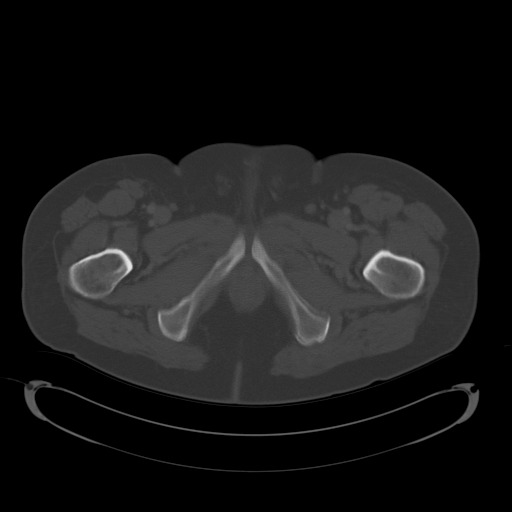
[im 22/138  soft-tissue]
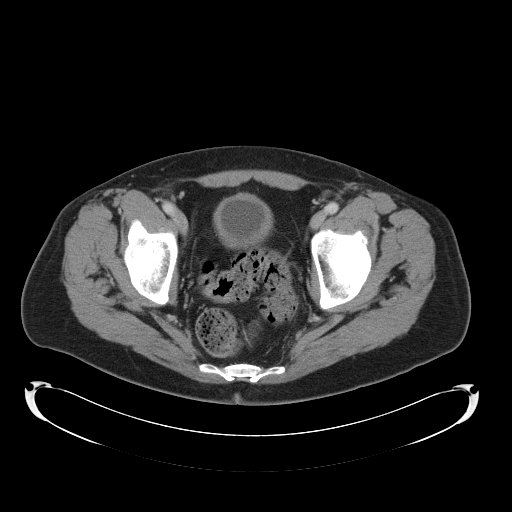
[im 29/138  soft-tissue]
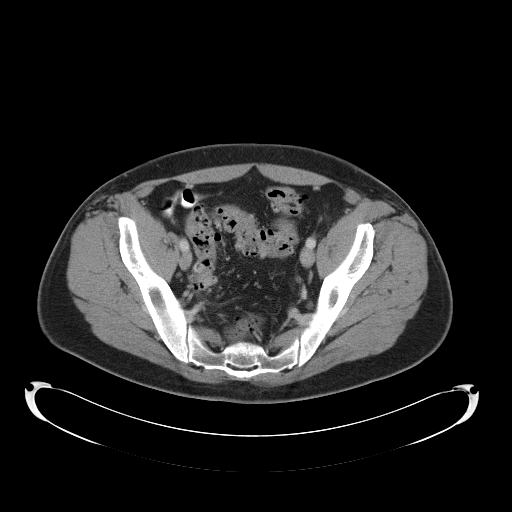
[im 44/138  soft-tissue]
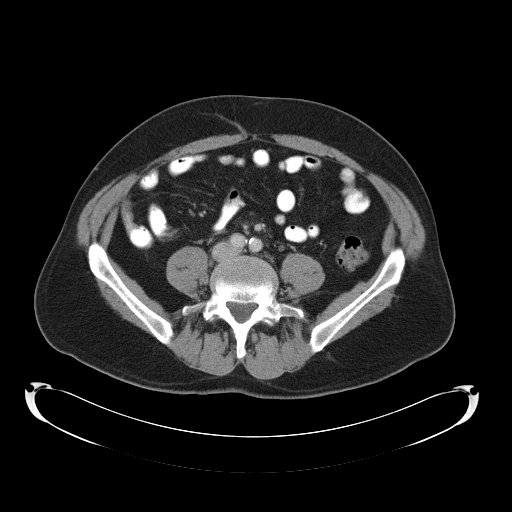
[im 51/138  soft-tissue]
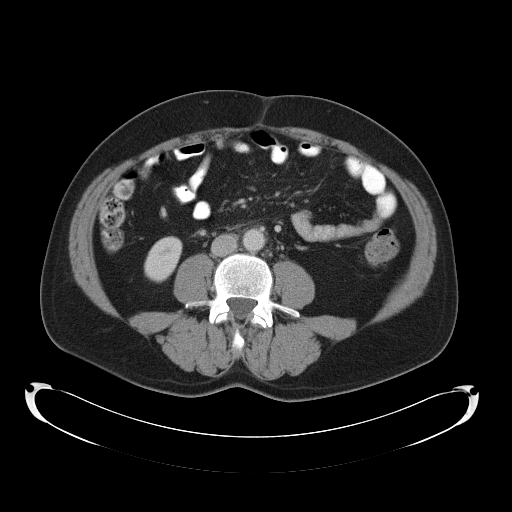
[im 65/138  soft-tissue]
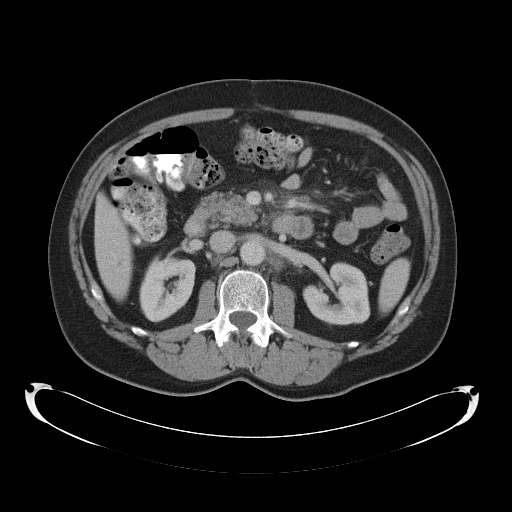
[im 73/138  soft-tissue]
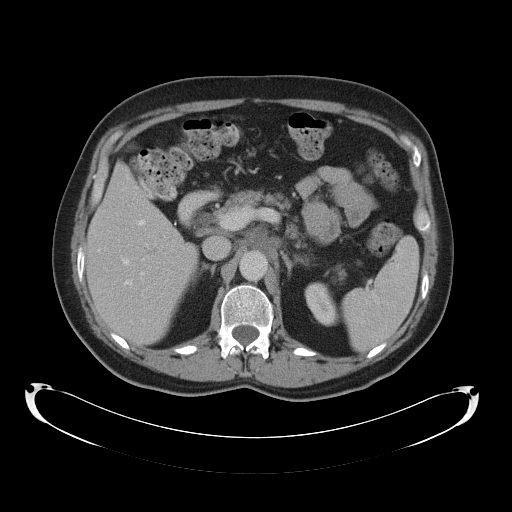
[im 87/138  soft-tissue]
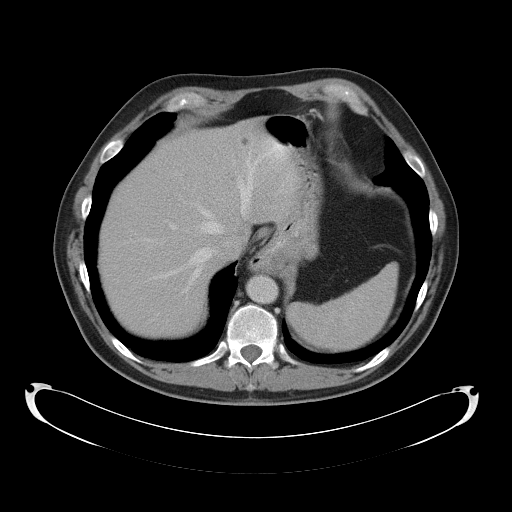
[im 94/138  soft-tissue]
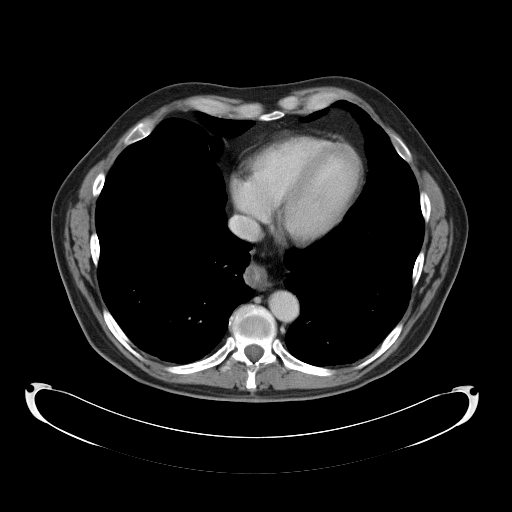
[im 94/138  bone]
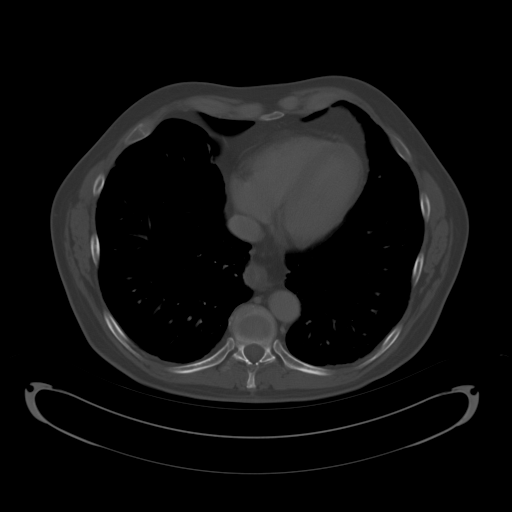
[im 109/138  soft-tissue]
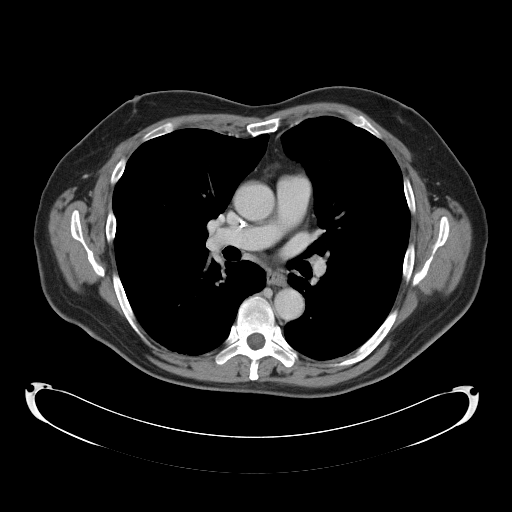
[im 109/138  lung]
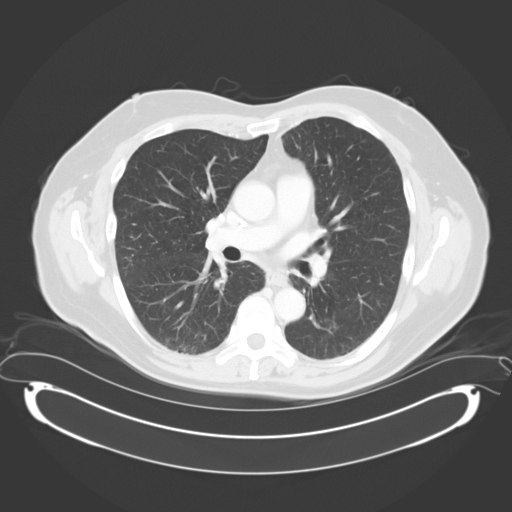
[im 116/138  soft-tissue]
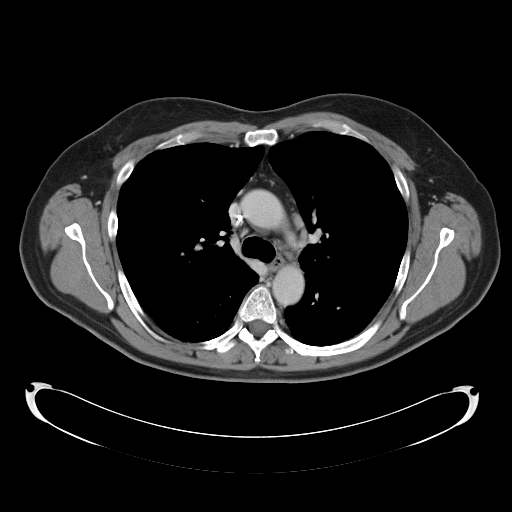
[im 116/138  lung]
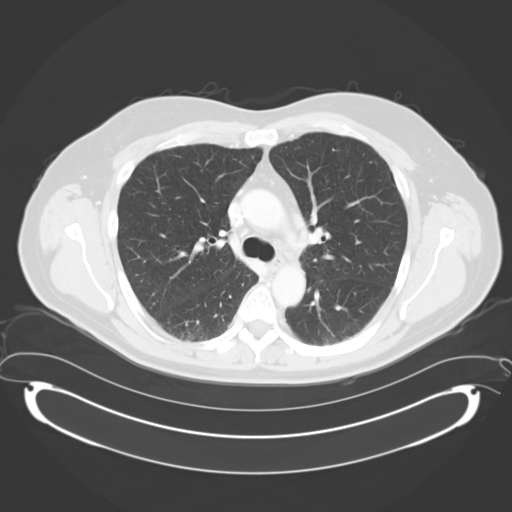
[im 123/138  lung]
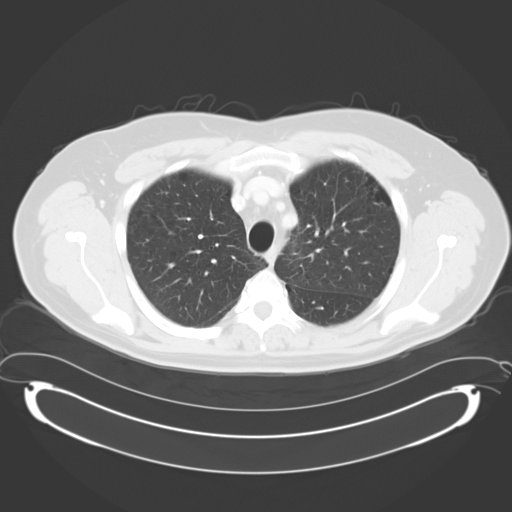
[im 130/138  soft-tissue]
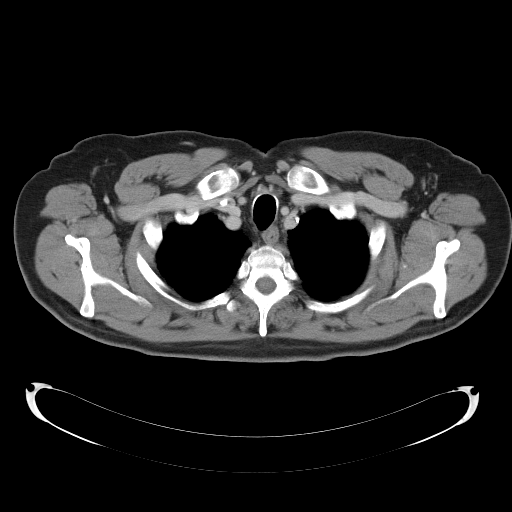
[im 130/138  lung]
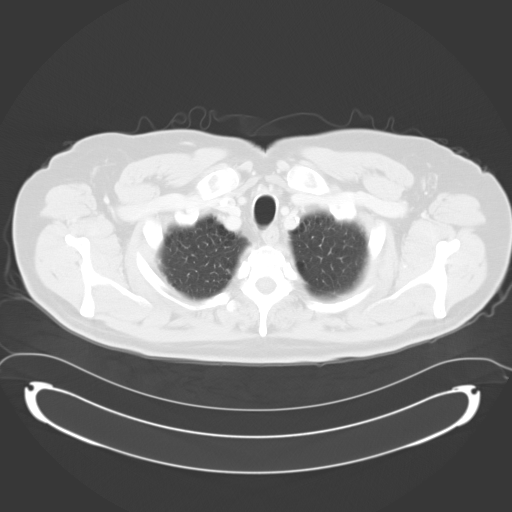

[13 of 32 positions shown; findings below may reference images not displayed]

IMPRESSION: Lung windows demonstrate biapical pleural parenchymal scarring.  Tiny nodular opacity in the medial left upper lobe (image 29) is stable.   This was also present on and unchanged from the CT of [DATE].
IMPRESSION: Stable exam.  No evidence for lymphadenopathy. 
CT ABDOMEN WITH CONTRAST:
Hiatal hernia again noted. Two tiny low-density lesions in the left liver are unchanged.  Mild intrahepatic biliary dilatation in this patient status-post cholecystectomy is unchanged.  The spleen, pancreas, adrenal glands, and kidneys are stable with a small low-density lesion in the left kidney, unchanged. 
The abnormal soft tissue attenuation in the retroperitoneal space, just anterior to the aorta between the celiac axis and SMA and also posterior to the left renal vein is stable.   This area shows no FDG avidity on the correlative PET CT scan from today.  Soft tissue attenuation in the root of the small bowel mesentery is also stable.
IMPRESSION: Stable exam.  There is abnormal but stable soft tissue attenuation in the retroperitoneal space which may be related to treated disease given the lack of FDG uptake.  Continued attention to this area on follow-up imaging is recommended. 
CT PELVIS WITH CONTRAST:
The prostate gland is enlarged.  The left inguinal lymph node measured previously at 2.1 x 1.7 cm measures 1.1 x 1.7 cm today.  Other associated left inguinal lymph nodes also measure smaller on the current exam.  No evidence for right axillary adenopathy. 
The prostate gland is enlarged.  Bladder wall thickening may be related to prostatomegaly.  Diverticular change characterizes the sigmoid colon without evidence for diverticulitis.
IMPRESSION: Interval decrease in size of the left inguinal lymph adenopathy.  No evidence for pelvic sidewall lymphadenopathy in this patient with a history of bilateral common and left external iliac adenopathy on earlier studies.

## 2004-04-27 IMAGING — PT NM PET TUM IMG SKULL BASE T - THIGH
5 series · 25 of 25 positions shown · non-contrast
Comparison: Previous study from [DATE] revealed mild increase in activity in the left inguinal area as the only region of abnormal FDG activity.

CLINICAL DATA: Pt has a hx of lymphoma.

 FDG PET-CT TUMOR IMAGING (SKULL BASE TO THIGHS)
 Fasting Blood Glucose:  115
TECHNIQUE: 15.4 mCi F-18 FDG were administered via left antecubital fossa.  Full ring PET imaging was performed from the skull base through the mid-thighs 75 minutes after injection.  CT data was obtained and used for attenuation correction and anatomic localization only.  (This was not acquired as a diagnostic CT examination.)

[Series 1: pet ac · axial · 3.3mm · 4.69mm/px · z∈[-880,-10]mm · 7 of 267 slices shown]
[im 1/267]
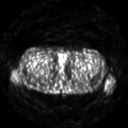
[im 45/267]
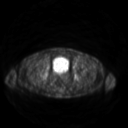
[im 89/267]
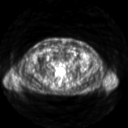
[im 134/267]
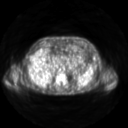
[im 178/267]
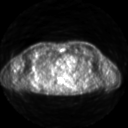
[im 222/267]
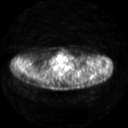
[im 267/267]
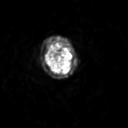

[Series 2: ct images · axial · 3.8mm · 0.98mm/px · z∈[-880,-10]mm · 8 of 267 slices shown]
[im 1/267]
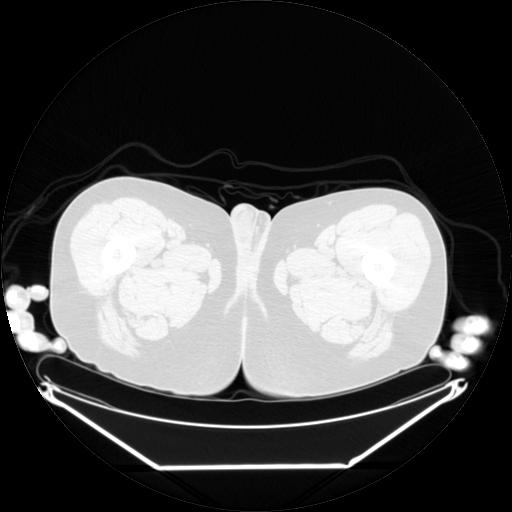
[im 39/267]
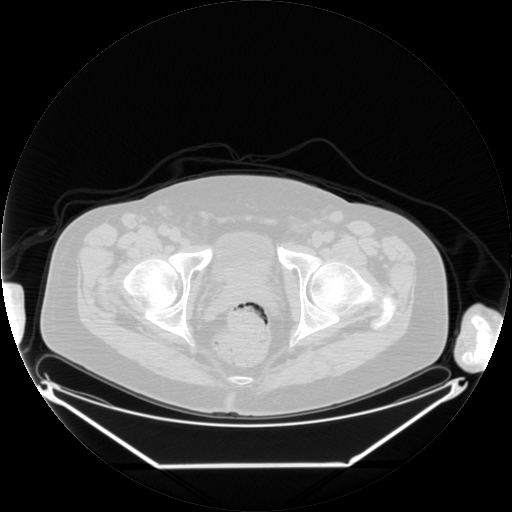
[im 77/267]
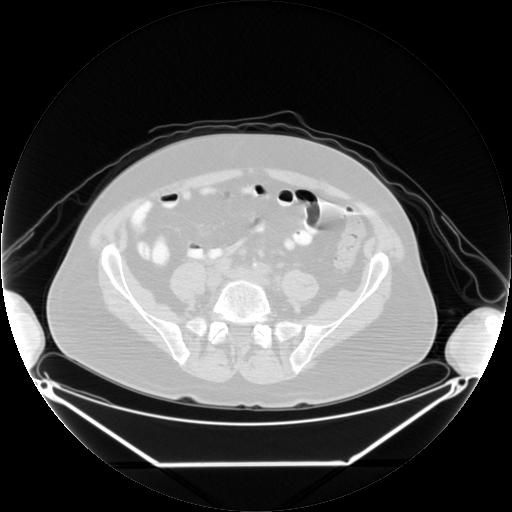
[im 115/267]
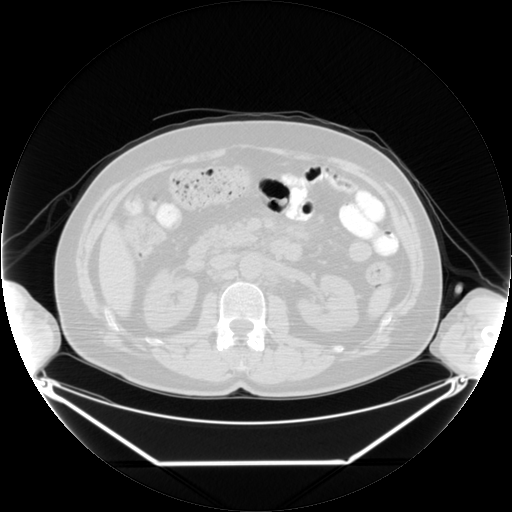
[im 153/267]
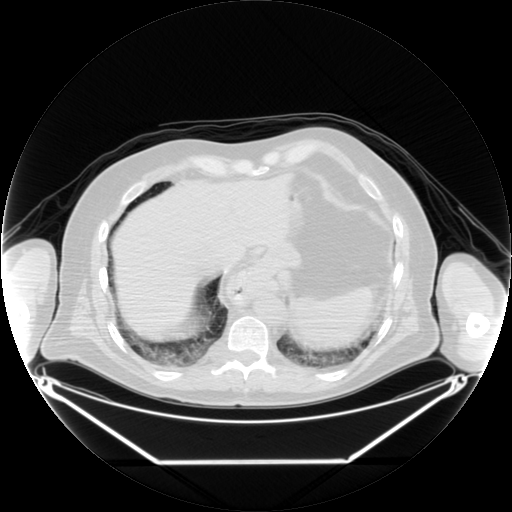
[im 191/267]
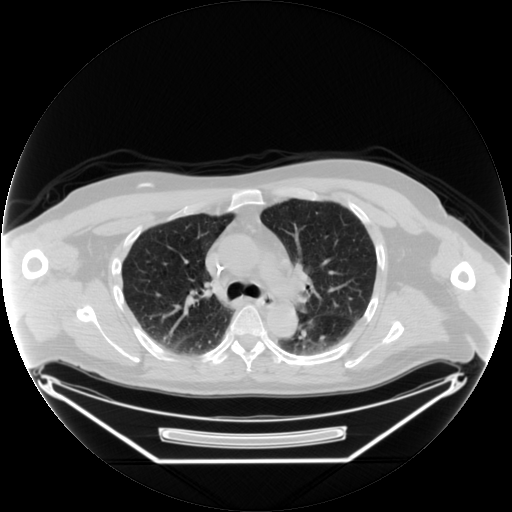
[im 229/267  full-range]
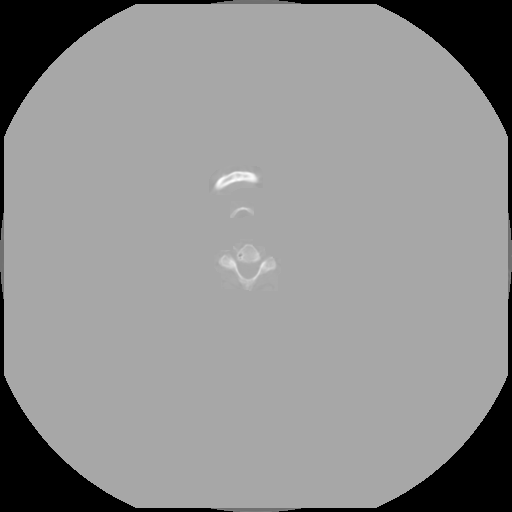
[im 267/267  brain]
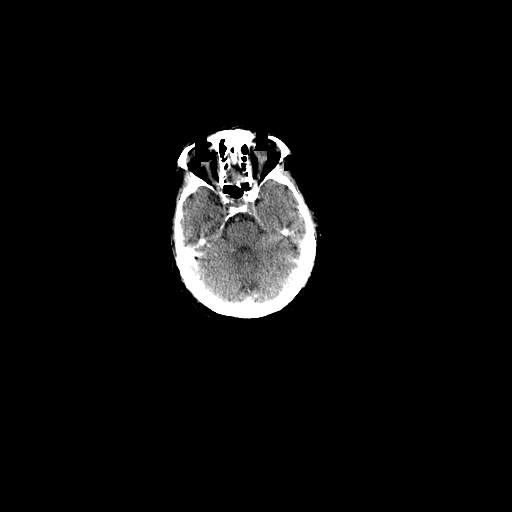

[Series 2: pet nac · axial · 3.3mm · 4.69mm/px · z∈[-880,-10]mm · 8 of 267 slices shown]
[im 1/267]
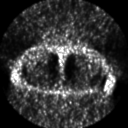
[im 39/267]
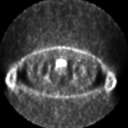
[im 77/267]
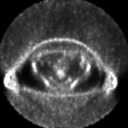
[im 115/267]
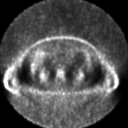
[im 153/267]
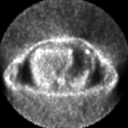
[im 191/267]
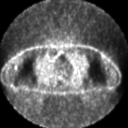
[im 229/267]
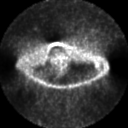
[im 267/267]
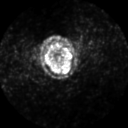

[Series 123: mip · coronal · 3.3mm · 4.69mm/px · 1 of 30 slices shown]
[im 1/30]
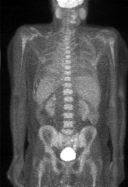

[Series 150: reformatted · axial · 3.3mm · 0.98mm/px · 1 of 1 slices shown]
[im 1/1]
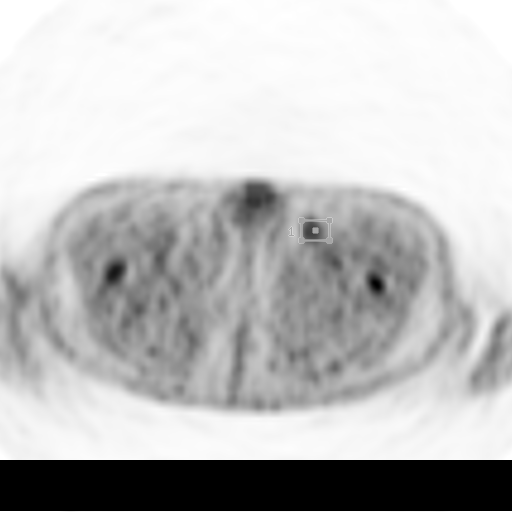

[25 of 25 positions shown; findings below may reference images not displayed]

FINDINGS: No abnormal activity in the neck or chest.  
 Also, no abnormal FDG activity in the upper abdomen.  In the pelvis, there are several focal areas of prominent activity thought to represent focal bowel activity.  No discrete masses are noted in the mesentery on the CT scan.  There is minimal increase in activity corresponding to nodes in the left inguinal area.  Nodes do remain on the CT scan; however, the SUV of one of the largest nodes is only 2.1.  
 As on the previous study, there is marked diffuse bone marrow activity compatible with bone marrow stimulating agents.
IMPRESSION: When compared to the previous study of [DATE], there has been improvement in the previous noted abnormal activity in the left inguinal area.  Minimal FDG activity remains in the left inguinal area.

## 2004-05-11 ENCOUNTER — Ambulatory Visit (HOSPITAL_COMMUNITY): Admission: RE | Admit: 2004-05-11 | Discharge: 2004-05-11 | Payer: Self-pay | Admitting: Oncology

## 2004-05-11 IMAGING — XA IR EVAL AND MANAGEMENT
1 series · 2 of 2 positions shown · non-contrast
Comparison: none

CLINICAL DATA: Lymphoma. Recent completion of chemotherapy.

[Series 1000: run · 0.22mm/px · 2 of 2 slices shown]
[im 1/2]
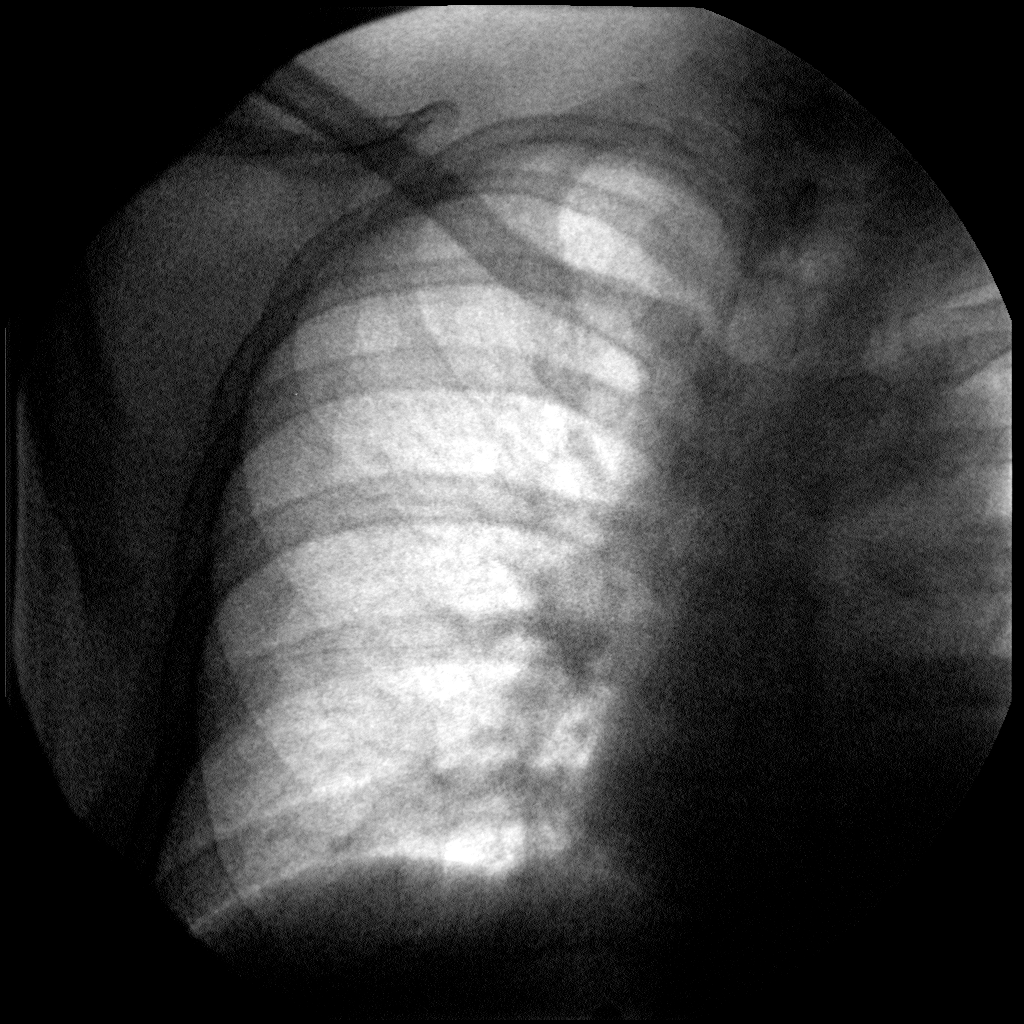
[im 2/2]
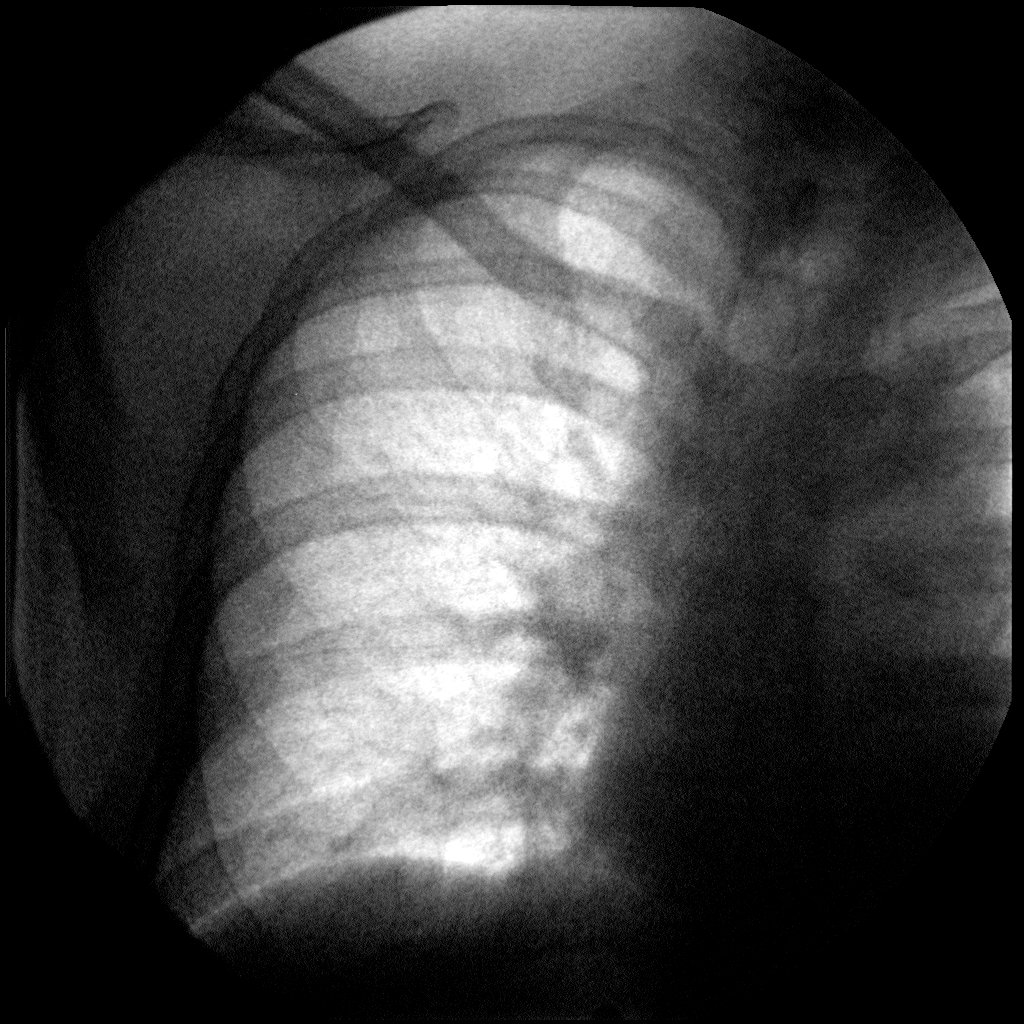

[2 of 2 positions shown; findings below may reference images not displayed]

PORT-A-CATH REMOVAL  [DATE]: 

Procedure:   1 gram of intravenous Ancef was administered prior to the
procedure. Using the usual strict sterile technique and 1% lidocaine as local
anesthetic, an incision was made in the right  upper chest wall at the site of
the previous incision. The Port-A-Cath was removed using a combination of sharp
and blunt dissection. Once removed, hemostasis was obtained at the puncture site
in the right side of the neck using manual compression. The port pocket was
flushed several times with sterile saline. The incision was closed using 3
interrupted 3-0 Monocryl sutures followed by a running 4-0 Vicryl subcuticular
stitch. Dermabond was applied followed by a sterile dressing. The patient
tolerated the procedure well.

Complications:  None.
IMPRESSION: Uncomplicated Port-A-Cath removal as described.

## 2004-07-27 ENCOUNTER — Ambulatory Visit (HOSPITAL_COMMUNITY): Admission: RE | Admit: 2004-07-27 | Discharge: 2004-07-27 | Payer: Self-pay | Admitting: Oncology

## 2004-07-27 IMAGING — CT CT ABDOMEN W/ CM
1 of 3 series · 13 of 32 positions shown, 18 images · IV contrast (omnipaque)
Comparison: [DATE].

CLINICAL DATA: Lymphoma.  
CHEST CT WITH CONTRAST:
TECHNIQUE: Multidetector CT imaging of the chest was performed following the standard protocol during the bolus administration of intravenous contrast.
Contrast:   125 cc Omnipaque 300.
TECHNIQUE: Multidetector CT imaging of the abdomen was performed following the standard protocol during bolus administration of intravenous contrast.
Tiny low attenuation lesions within the liver are unchanged and remain too small to characterize.  There is mild intrahepatic biliary ductal dilatation in this patient, who is status post cholecystectomy.
Spleen negative.
Adrenal glands negative.  
Subcentimeter low attenuation lesion arising from the posterior aspect of the left kidney (image #79) is too small to characterize but likely represents a simple cyst and remains stable.  
Pancreas is negative.  
Subcentimeter mesenteric lymphadenopathy and ?misty mesentery? is stable.  
Abnormal soft tissue attenuation in retroperitoneal space, anterior to the aorta and between the celiac axis and SMA is stable.  No free abdominal fluid.  
Visualized bowel loops are unremarkable.
TECHNIQUE: Multidetector CT imaging of the pelvis was performed following the standard protocol during bolus administration of intravenous contrast.
Stable prostate enlargement.  
No pathologically enlarged pelvic lymphadenopathy.  No free pelvic fluid.  The visualized pelvic bowel loops demonstrate sigmoid diverticulosis but no evidence for active inflammation.  
Left inguinal lymph node again identified and currently measured 15.9 x 11.5 mm, previously this measured 17.4 x 10.6 mm.

[Series 2: cap 5.0 b40f st · axial · 0.73mm/px · z∈[+558,+1158]mm · 13 of 138 slices shown, 18 images]
[im 9/138  soft-tissue]
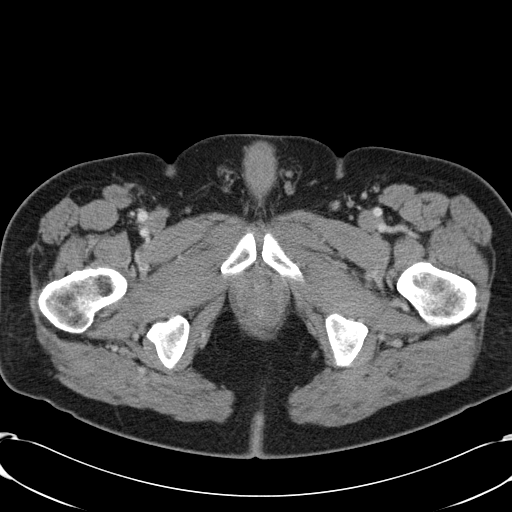
[im 9/138  bone]
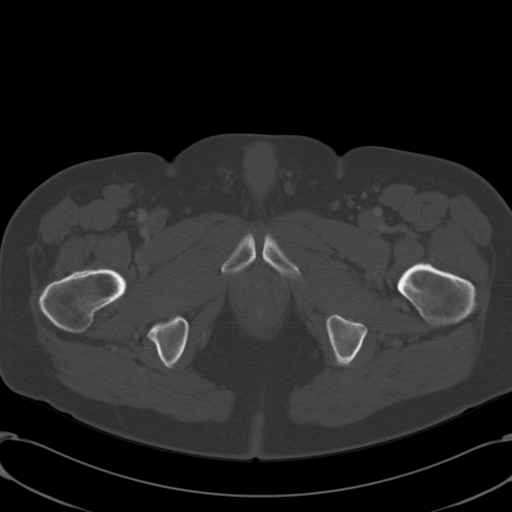
[im 25/138  soft-tissue]
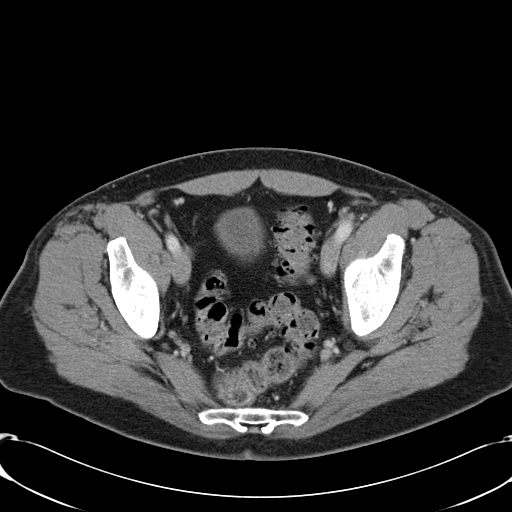
[im 33/138  soft-tissue]
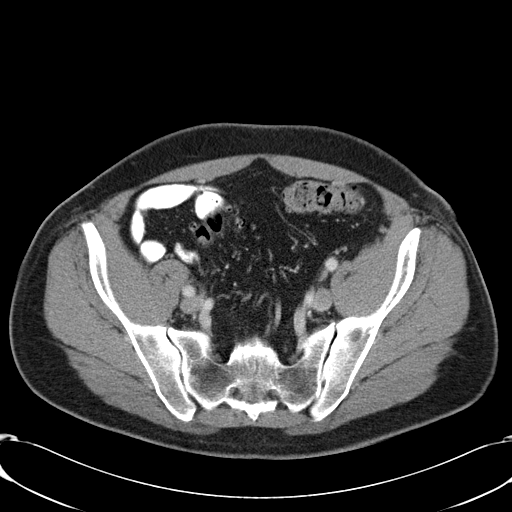
[im 41/138  soft-tissue]
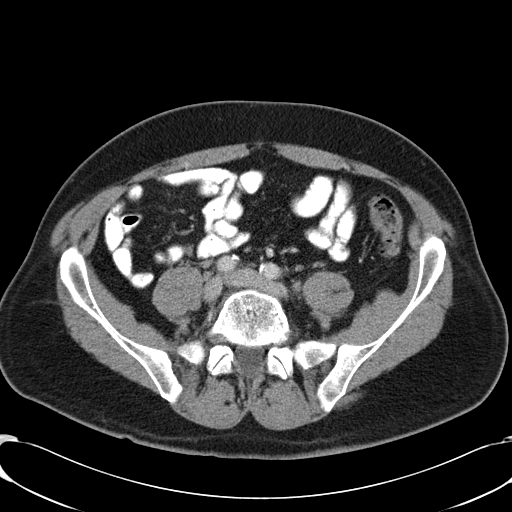
[im 57/138  soft-tissue]
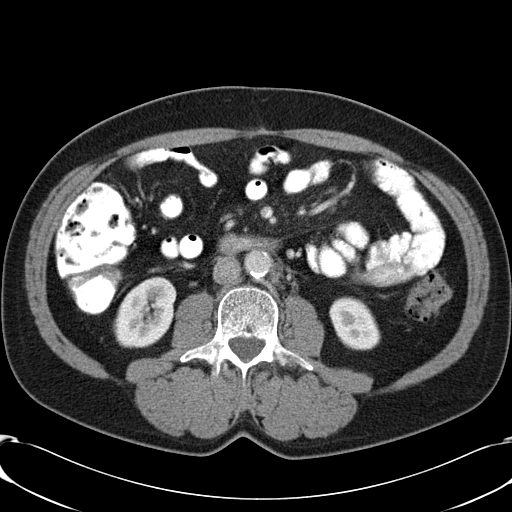
[im 65/138  soft-tissue]
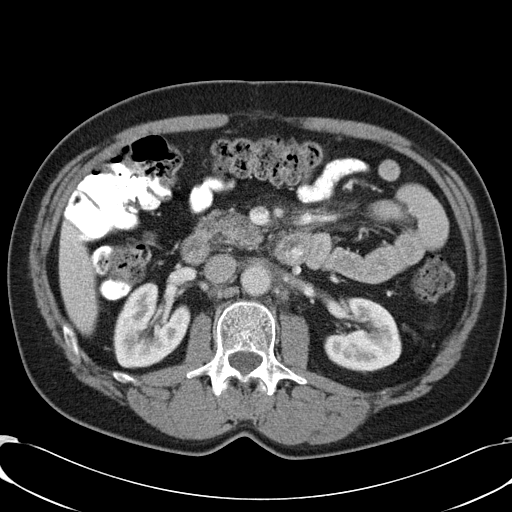
[im 73/138  soft-tissue]
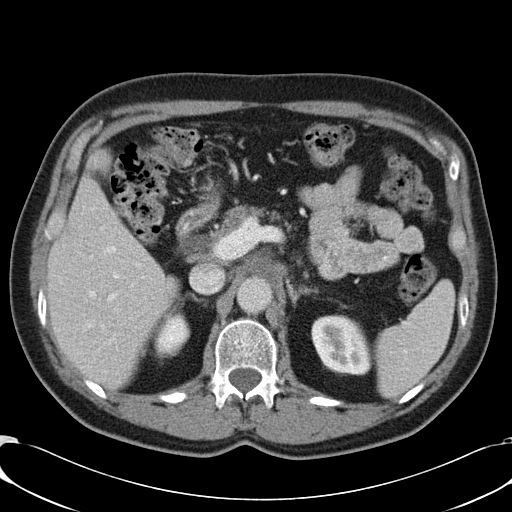
[im 89/138  soft-tissue]
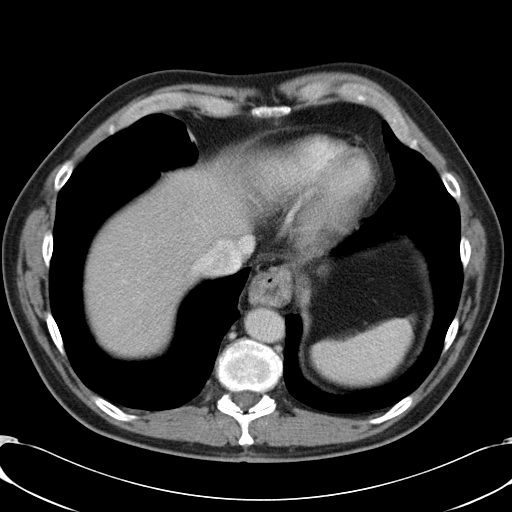
[im 97/138  soft-tissue]
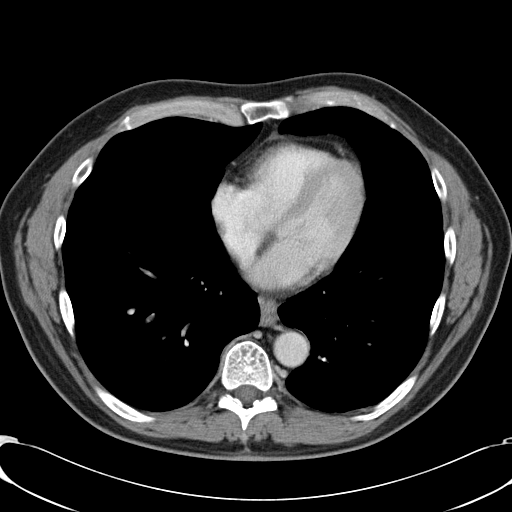
[im 97/138  bone]
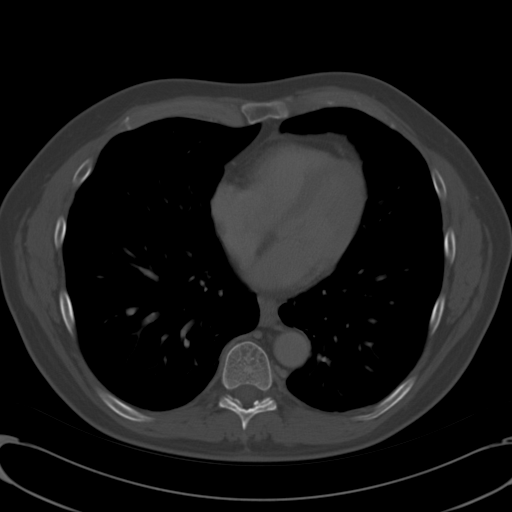
[im 105/138  soft-tissue]
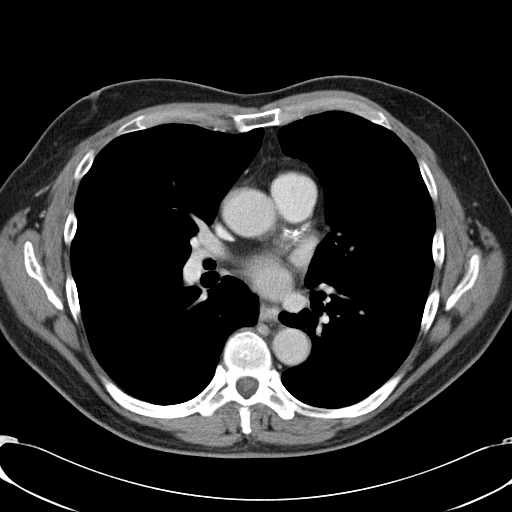
[im 105/138  lung]
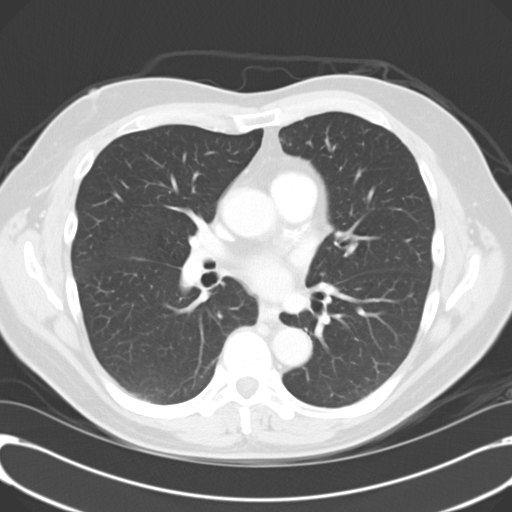
[im 113/138  lung]
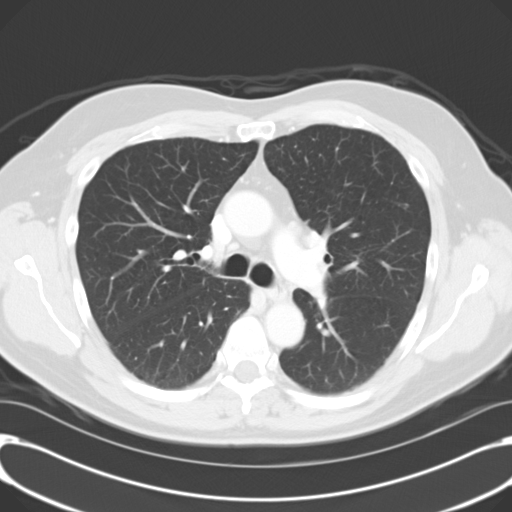
[im 121/138  soft-tissue]
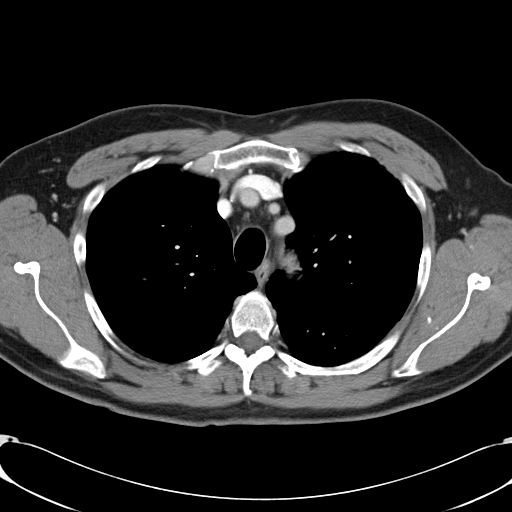
[im 121/138  lung]
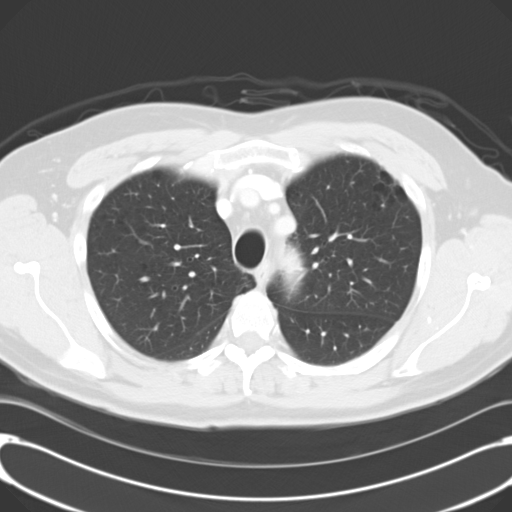
[im 129/138  soft-tissue]
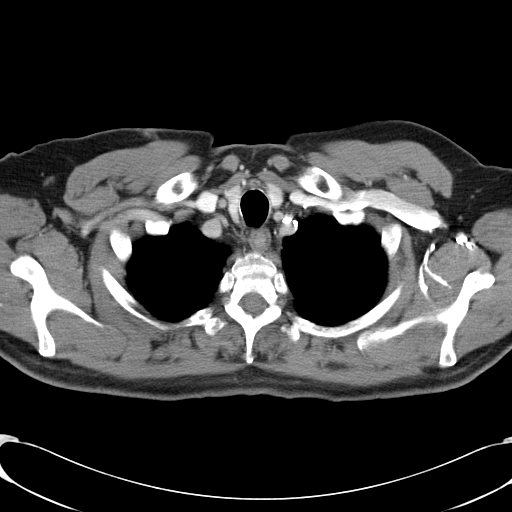
[im 129/138  lung]
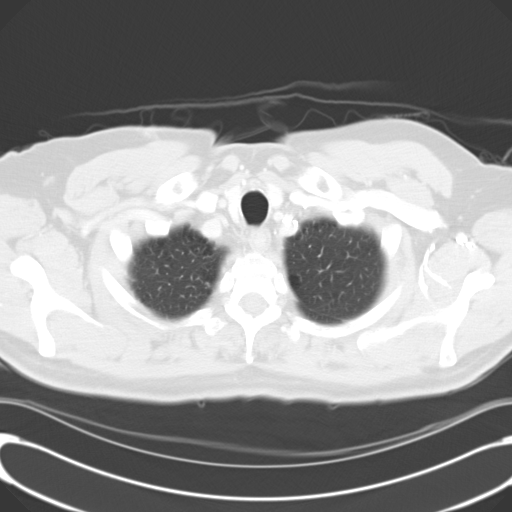

[13 of 32 positions shown; findings below may reference images not displayed]

No pathologically enlarged axillary lymphadenopathy.
Stable sub centimeter pre carinal and right paratracheal lymph nodes.  No hilar lymphadenopathy.  No pleural or pericardial effusion.  
Again demonstrated is fluid density mass in bilateral neural foramina at T10-11.  This is unchanged and may represent a neuro-enteric cyst.  At the T11-12 these are also unchanged.  Again, these may represent benign neuroenteric cysts.   
Tiny nodular opacity (image #31) within the left upper lobe is unchanged.
IMPRESSION: 1.  No definite evidence for tumor within the chest.  
2.  Stable tiny medial left upper lobe nodule.  
ABDOMEN CT WITH CONTRAST:
IMPRESSION: 1.  Stable exam.
2.  Normal but stable soft tissue attenuation in retroperitoneal space is unchanged.  Also unchanged is subcentimeter mesenteric lymphadenopathy.
PELVIS CT WITH CONTRAST:
IMPRESSION: Stable left inguinal lymph node.

## 2004-07-27 IMAGING — PT NM PET TUM IMG SKULL BASE T - THIGH
4 series · 25 of 25 positions shown · non-contrast
Comparison: [DATE].

CLINICAL DATA: Lymphoma.
FDG PET-CT TUMOR IMAGING (SKULL BASE TO MID THIGHS):
Fasting Blood Glucose:  81.
TECHNIQUE: 17.1 mCi F-18 FDG IV were administered via the right antecubital fossa site.  Delayed PET images were obtained from the skull base through the mid-thighs.  CT images were obtained for anatomic correlation and attenuation correction.
Injection time:  [DATE] p.m.
Scan time:  [DATE] p.m.

[Series 1: pet ac · axial · 3.3mm · 4.69mm/px · z∈[-871,-1]mm · 8 of 267 slices shown]
[im 1/267]
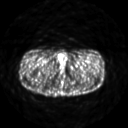
[im 39/267]
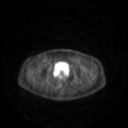
[im 77/267]
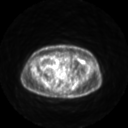
[im 115/267]
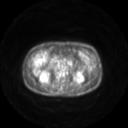
[im 153/267]
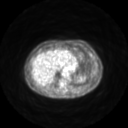
[im 191/267]
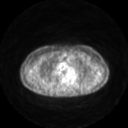
[im 229/267]
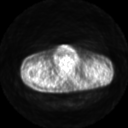
[im 267/267]
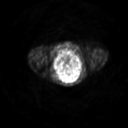

[Series 2: ct images · axial · 3.8mm · 0.98mm/px · z∈[-871,-2]mm · 8 of 267 slices shown]
[im 1/267]
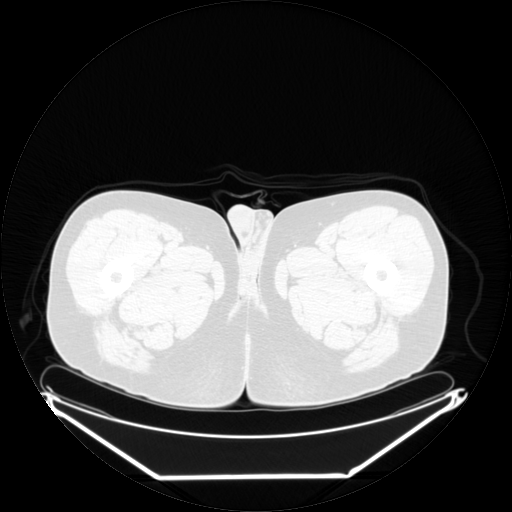
[im 39/267]
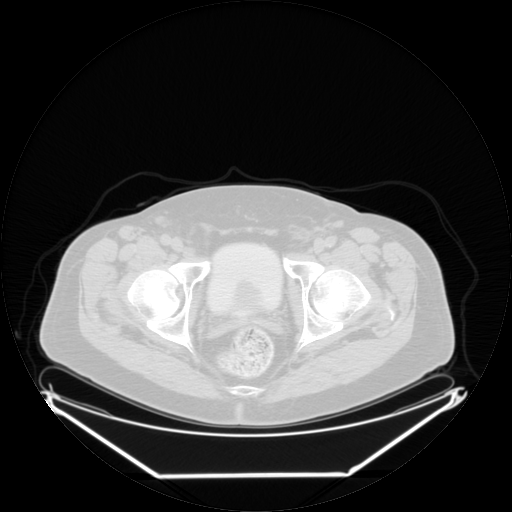
[im 77/267]
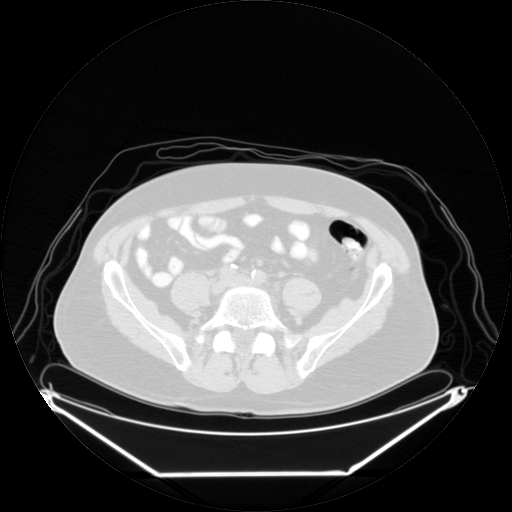
[im 115/267]
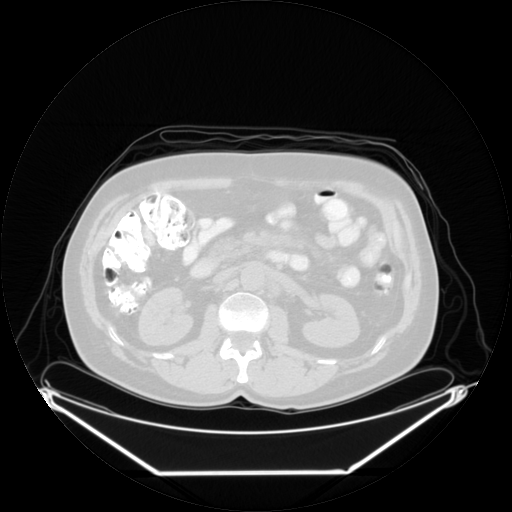
[im 153/267]
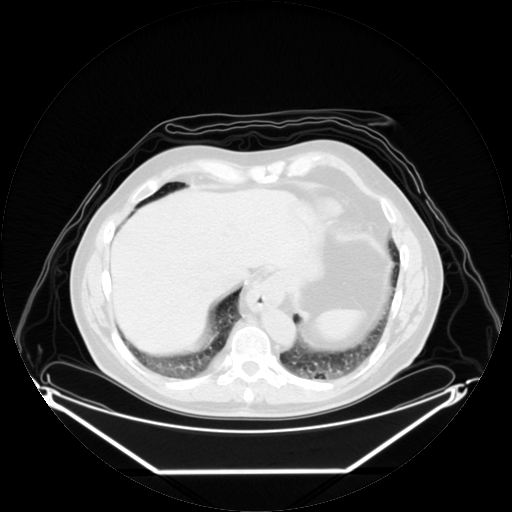
[im 191/267]
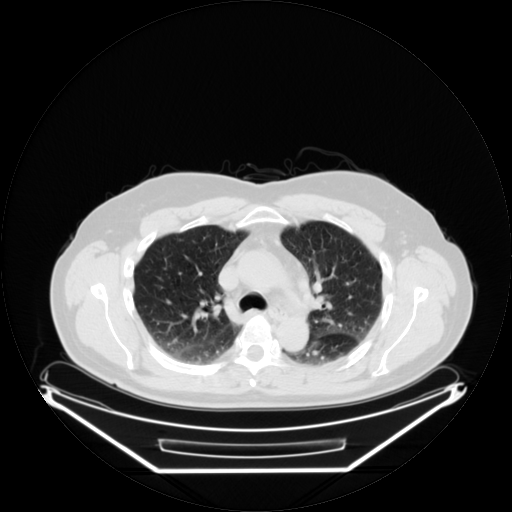
[im 229/267]
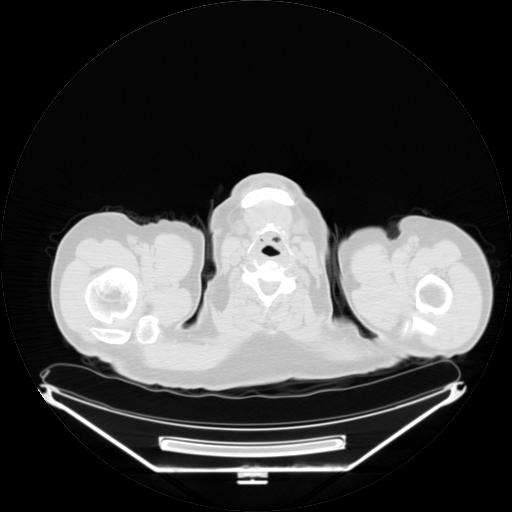
[im 267/267  brain]
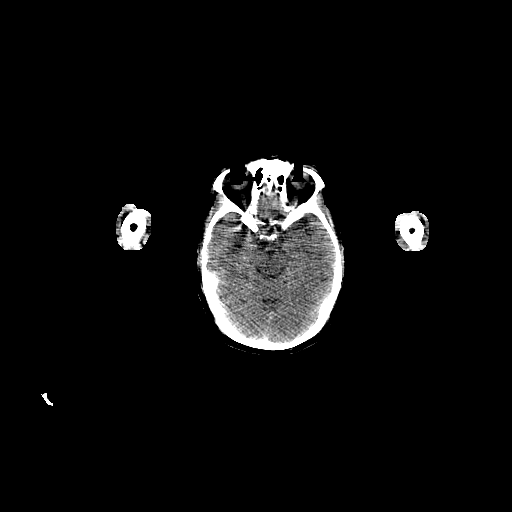

[Series 2: pet nac · axial · 3.3mm · 4.69mm/px · z∈[-871,-1]mm · 8 of 267 slices shown]
[im 1/267]
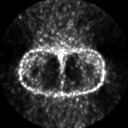
[im 39/267]
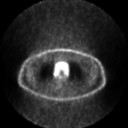
[im 77/267]
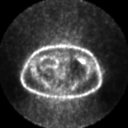
[im 115/267]
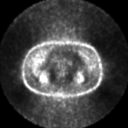
[im 153/267]
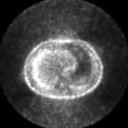
[im 191/267]
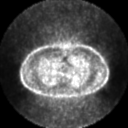
[im 229/267]
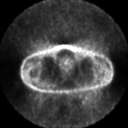
[im 267/267]
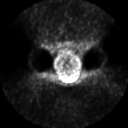

[Series 123: mip · coronal · 3.3mm · 4.69mm/px · 1 of 30 slices shown]
[im 1/30]
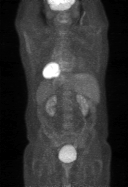

[25 of 25 positions shown; findings below may reference images not displayed]

FINDINGS: No hypermetabolic or pathologically-enlarged lymph nodes are seen within the soft tissues of the neck.  
No hypermetabolic or pathologically-enlarged axillary lymph nodes.  
Pre-vascular lymph nodes measure 12.7 x 9.8 mm (image #77), but does not demonstrate any significant increased FDG uptake.
Abnormal uptake within the distal esophagus may be related to esophagitis.  
No suspicious pulmonary nodules or masses are noted.  There is no pleural or pericardial effusion.  
Low attenuation lesion within the left lobe of the liver is too small to reliably characterize, but does not demonstrate any significant FDG uptake.  Additional low attenuation lesion within the liver (image #111) also is too small to characterize, but likely represents a simple cyst.  When compared with the prior exam, these are unchanged.  
Spleen negative. 
Kidneys negative.
Adrenal glands negative. 
Pancreas is negative. 
No hypermetabolic or pathologically enlarged retroperitoneal or mesenteric lymphadenopathy is noted. 
No pathologically-enlarged pelvic lymphadenopathy.  No free pelvic fluid.  Left inguinal lymph node measures 19.4 x 13.2 mm (image #234).  Mild low level FDG uptake within this lymph node is noted.  
Left inguinal lymph nodes are again noted, the largest of which is identified on image #256 and measures 13.1 x 18.8 mm compared with 18.3 x 14.6 mm previously.  The degree of FDG uptake within this lymph node is decreased in the interval.
Second enlarged lymph node is identified on image #235.  This measures 12.7 mm x 14.9 mm compared with 12.4 mm x 18.4 mm previously.  The degree of increased FDG uptake within this lymph node remains low level and is unchanged in the interval.
IMPRESSION: 1.  Persistent fat stranding within the mesentery without significant FDG uptake.  Continued interval surveillance of this area is recommended.  
2.  Mixed interval response to therapy within the left inguinal region.  Several of the enlarged left inguinal lymph nodes are unchanged in size and degree of mild low-level FDG uptake.  The largest lymph node, best seen on image #235, is slightly decreased in size as well as FDG uptake.

## 2004-10-24 ENCOUNTER — Observation Stay (HOSPITAL_COMMUNITY): Admission: EM | Admit: 2004-10-24 | Discharge: 2004-10-25 | Payer: Self-pay | Admitting: Emergency Medicine

## 2004-10-24 IMAGING — CT CT HEAD W/O CM
1 of 2 series · 13 of 30 positions shown, 17 images · IV contrast (agent unspecified)
Comparison: None.

CLINICAL DATA: Dizziness.  Headache.  Near syncope.
 CT HEAD CT WITHOUT CONTRAST:
TECHNIQUE: Contiguous axial images were obtained from the base of the skull through the vertex according to standard protocol without contrast.

[Series 2: brain · axial · 0.47mm/px · z∈[+146,+278]mm · 13 of 32 slices shown, 17 images]
[im 3/32  brain]
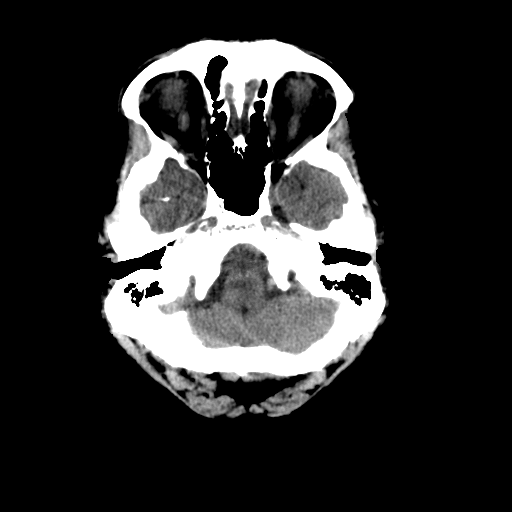
[im 3/32  bone]
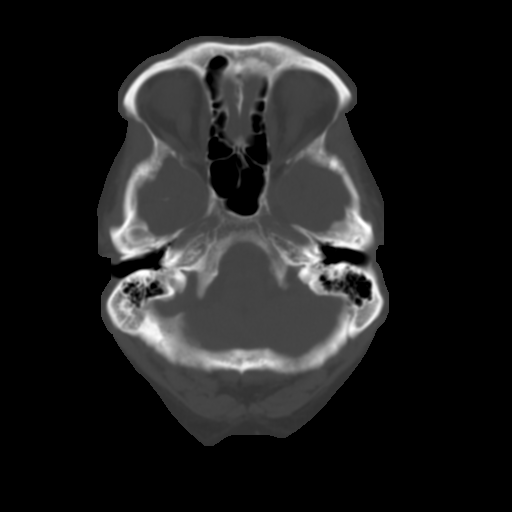
[im 5/32  brain]
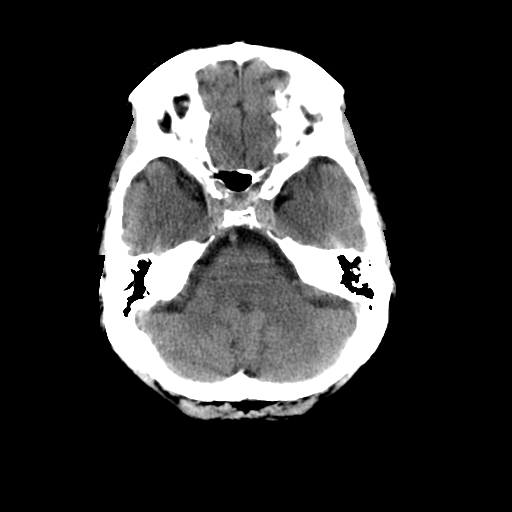
[im 7/32  brain]
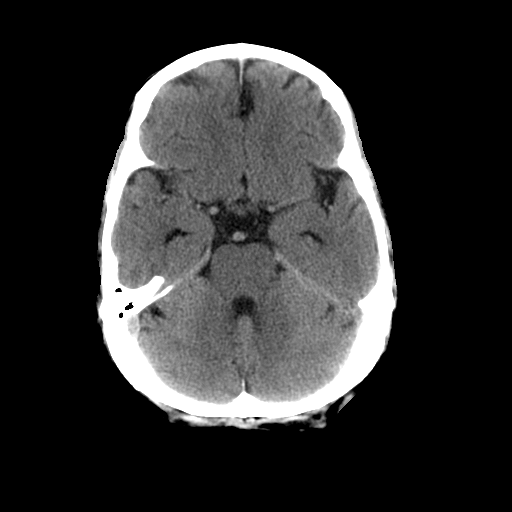
[im 9/32  brain]
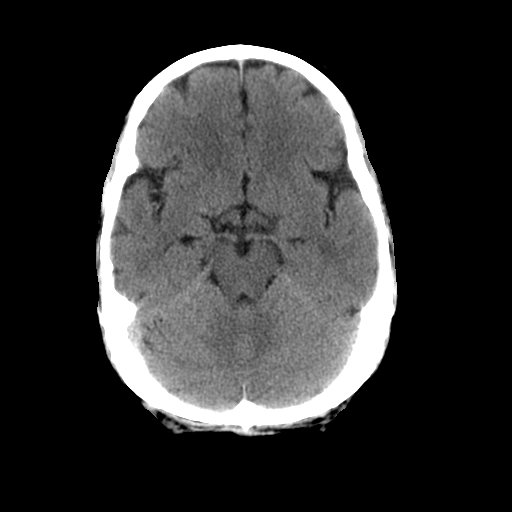
[im 12/32  brain]
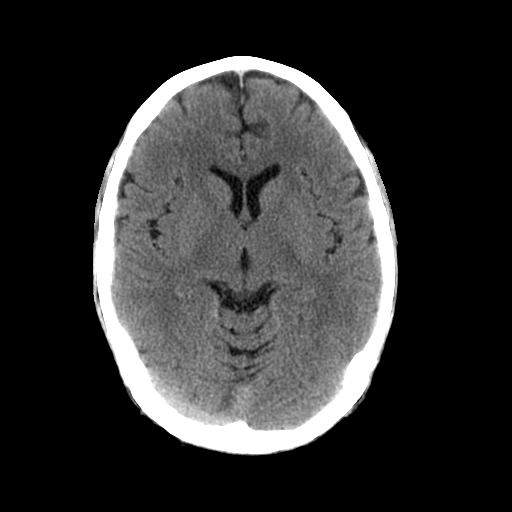
[im 12/32  bone]
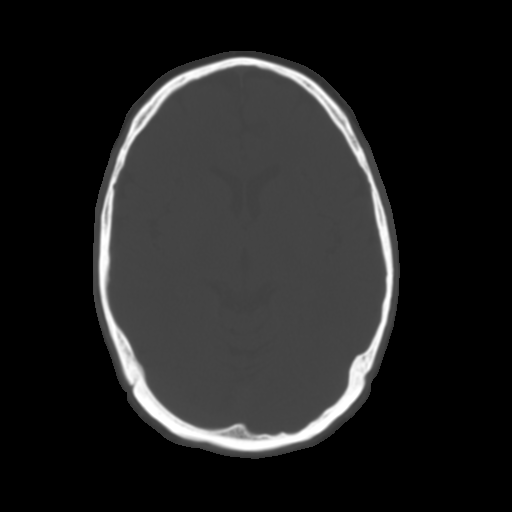
[im 14/32  brain]
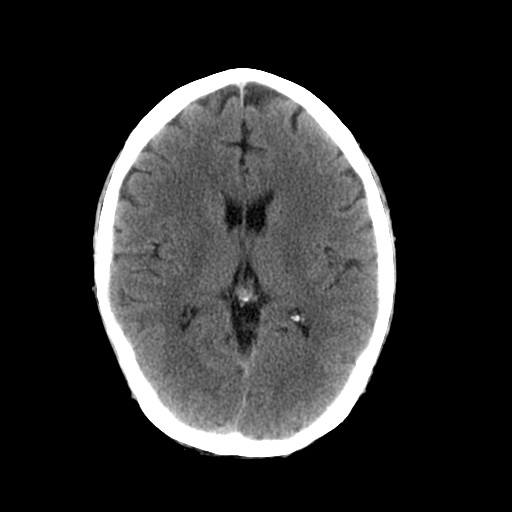
[im 16/32  brain]
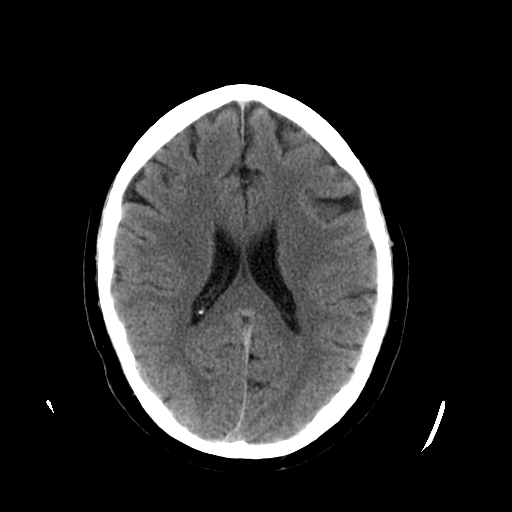
[im 18/32  brain]
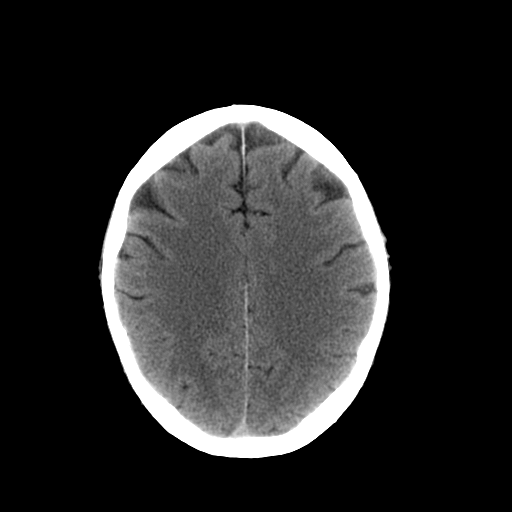
[im 20/32  brain]
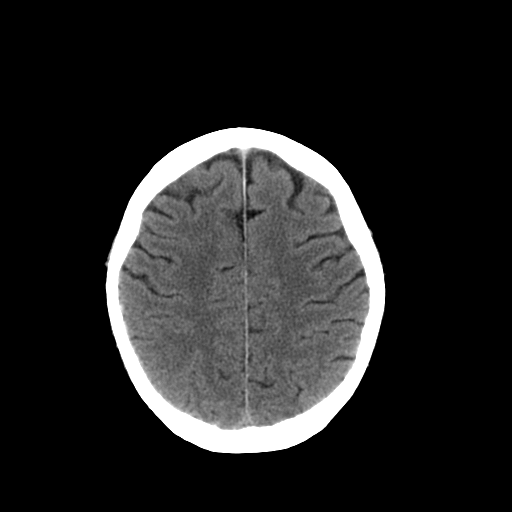
[im 20/32  bone]
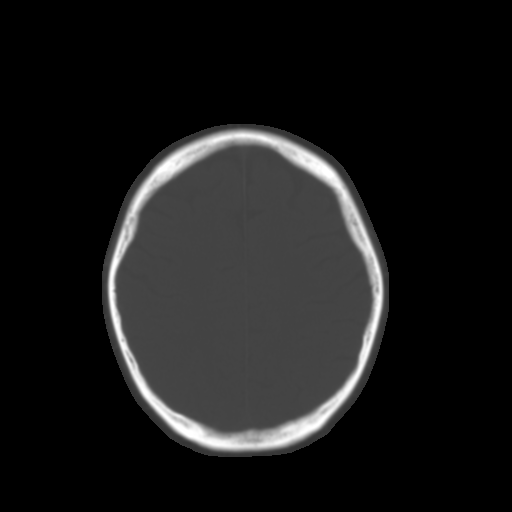
[im 23/32  brain]
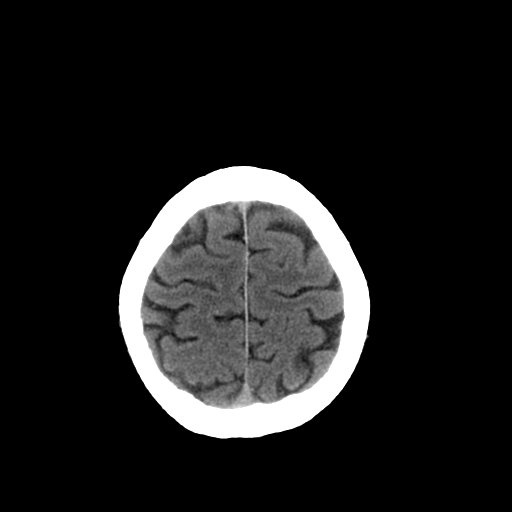
[im 25/32  brain]
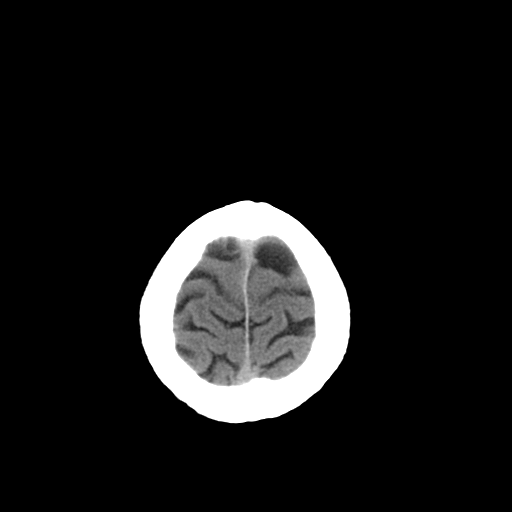
[im 27/32  brain]
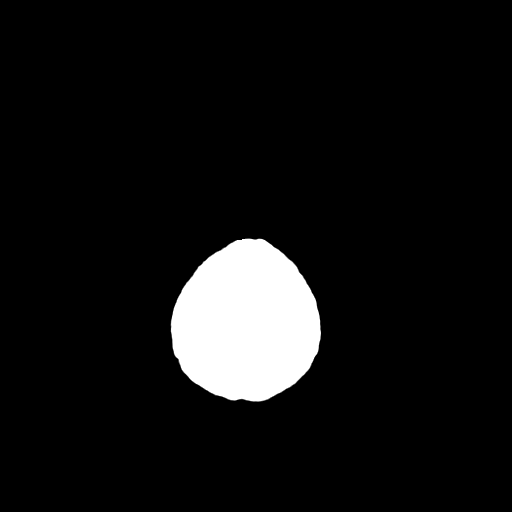
[im 29/32  brain]
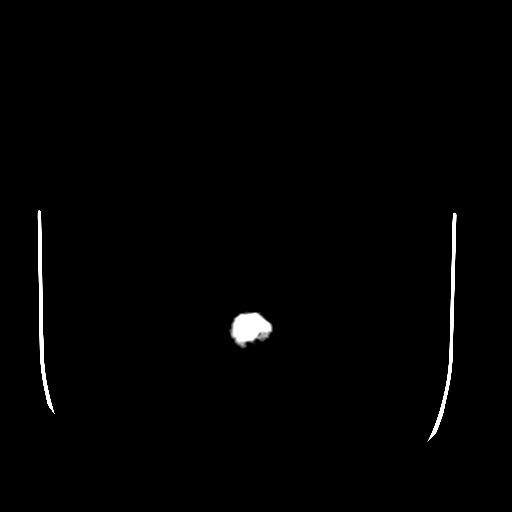
[im 29/32  bone]
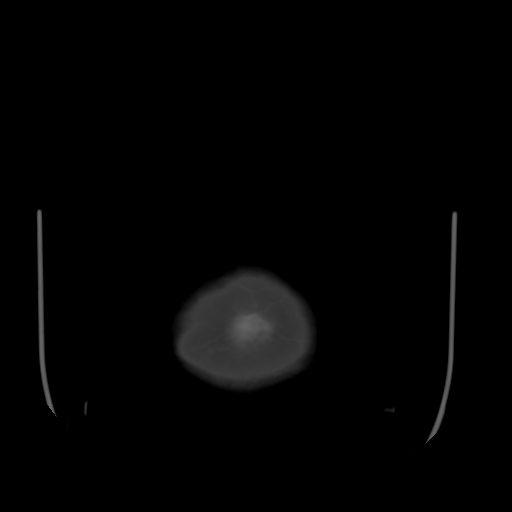

[13 of 30 positions shown; findings below may reference images not displayed]

FINDINGS: The ventricles are not enlarged.  There is no intracranial hemorrhage or mass and there is no infarct.
IMPRESSION: Negative.

## 2004-11-09 ENCOUNTER — Ambulatory Visit (HOSPITAL_COMMUNITY): Admission: RE | Admit: 2004-11-09 | Discharge: 2004-11-09 | Payer: Self-pay | Admitting: Oncology

## 2004-11-09 IMAGING — PT NM PET TUM IMG SKULL BASE T - THIGH
4 series · 25 of 25 positions shown · non-contrast
Comparison: PET CT [DATE].

CLINICAL DATA: Follicular lymphoma, restaging. 
FDG PET-CT TUMOR IMAGING (SKULL BASE TO THIGHS):
Fasting Blood Glucose:  91.
TECHNIQUE: 15.8 mCi F-18 FDG was injected intravenously via the right antecubital fossa. Full-ring PET imaging was performed from the skull base through the mid-thighs 58 minutes after injection.  CT data was obtained and used for attenuation correction and anatomic localization only.  (This was not acquired as a diagnostic CT examination).

[Series 1: pet ac · axial · 3.3mm · 4.69mm/px · z∈[-888,-18]mm · 8 of 267 slices shown]
[im 1/267]
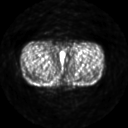
[im 39/267]
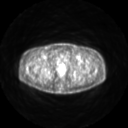
[im 77/267]
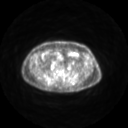
[im 115/267]
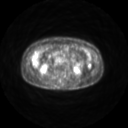
[im 153/267]
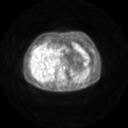
[im 191/267]
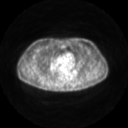
[im 229/267]
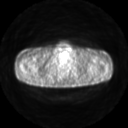
[im 267/267]
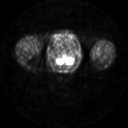

[Series 2: ct images · axial · 3.8mm · 0.98mm/px · z∈[-888,-18]mm · 8 of 260 slices shown]
[im 1/260]
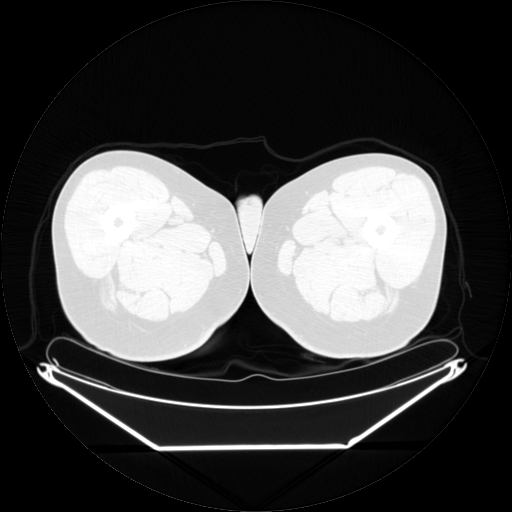
[im 38/260]
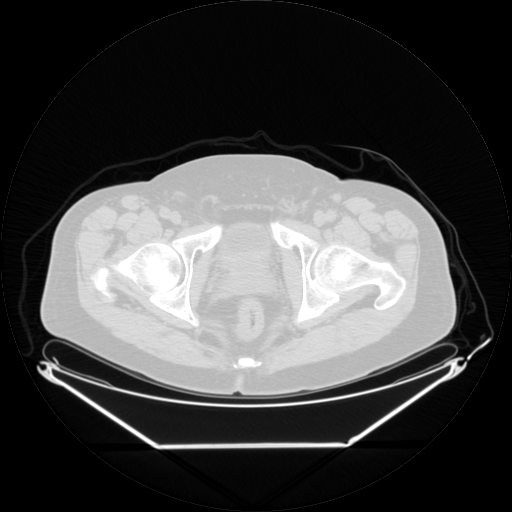
[im 75/260]
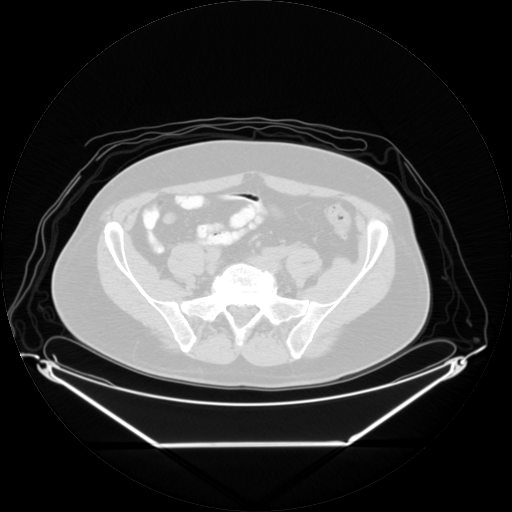
[im 112/260]
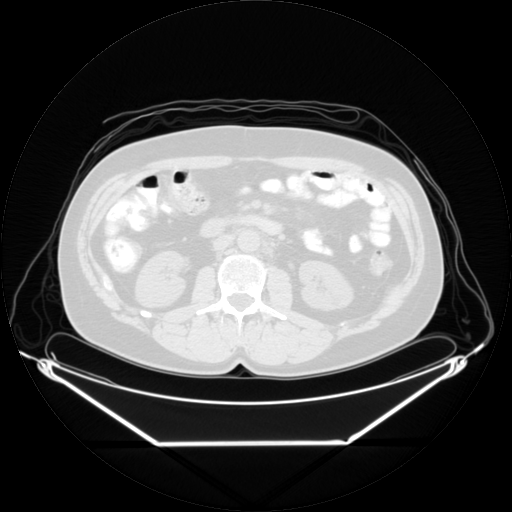
[im 149/260]
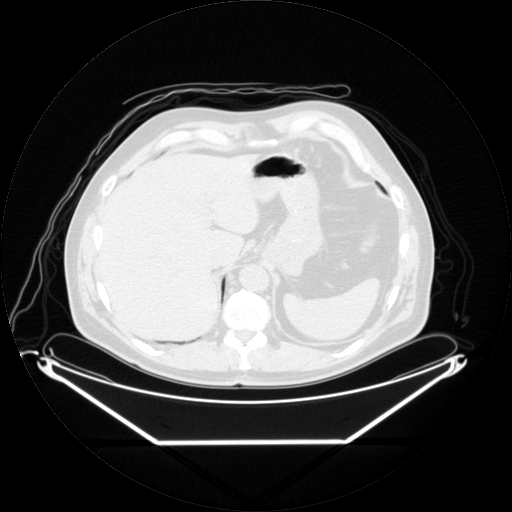
[im 186/260]
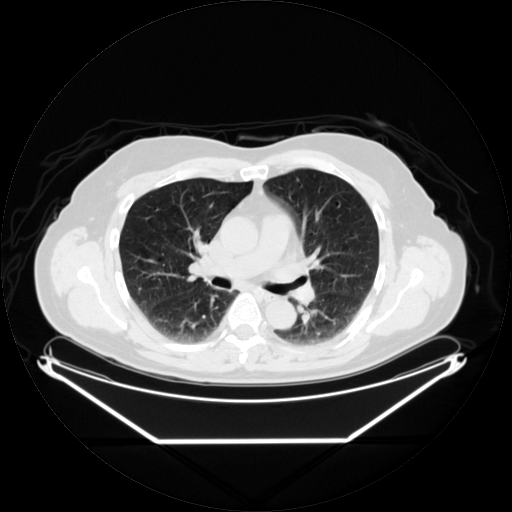
[im 223/260]
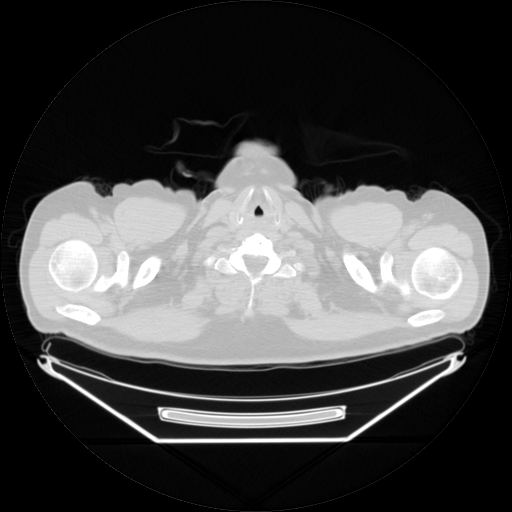
[im 260/260]
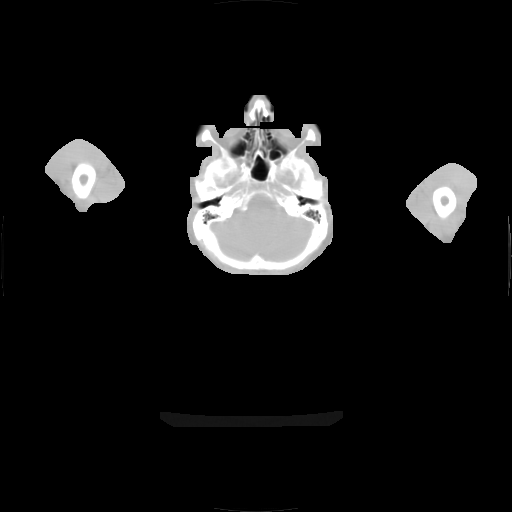

[Series 2: pet nac · axial · 3.3mm · 4.69mm/px · z∈[-888,-18]mm · 8 of 267 slices shown]
[im 1/267]
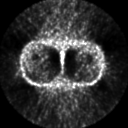
[im 39/267]
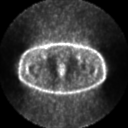
[im 77/267]
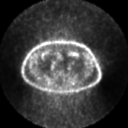
[im 115/267]
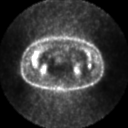
[im 153/267]
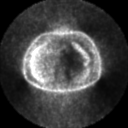
[im 191/267]
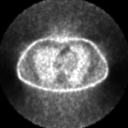
[im 229/267]
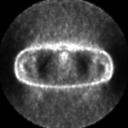
[im 267/267]
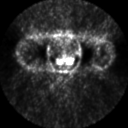

[Series 123: mip · coronal · 3.3mm · 4.69mm/px · 1 of 30 slices shown]
[im 1/30]
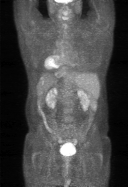

[25 of 25 positions shown; findings below may reference images not displayed]

FINDINGS: No abnormal area of increased FDG uptake is seen within the neck, chest, abdomen or pelvis.  Again noted is mesenteric stranding within the abdomen with small mesenteric and a few retroperitoneal lymph nodes, without corresponding increased FDG avidity.  Left inguinal greater than right inguinal lymph nodes are also stable.  There is normal physiologic uptake of FDG within the kidneys and bowel.
IMPRESSION: No significant FDG uptake, including the area of mesenteric fat stranding and bilateral left greater than right inguinal lymph nodes.  Findings are unchanged since the prior study. 
Please refer to diagnostic CT of the chest, abdomen and pelvis for detailed CT findings, dictated under separate report.

## 2004-11-09 IMAGING — CT CT PELVIS W/ CM
2 of 6 series · 16 of 46 positions shown, 18 images · IV contrast (omnipaque)
Comparison: CT chest, abdomen and pelvis of [DATE] and PET CT [DATE].

CLINICAL DATA: Follicular lymphoma, restaging. 
 CHEST CT WITH CONTRAST:
TECHNIQUE: Multidetector CT imaging of the chest was performed following the standard protocol during bolus administration of intravenous contrast.
 Contrast:  125 cc Omnipaque 300.   Oral contrast was also given.
TECHNIQUE: Multidetector CT imaging of the abdomen was performed following the standard protocol during bolus administration of intravenous contrast.
 Contrast:  125 cc Omnipaque 300.
TECHNIQUE: Multidetector CT imaging of the pelvis was performed following the standard protocol during bolus administration of intravenous contrast.

[Series 4: thin section cap b40f st · axial · 0.79mm/px · z∈[-648,-15]mm · 13 of 984 slices shown, 15 images]
[im 40/984  soft-tissue]
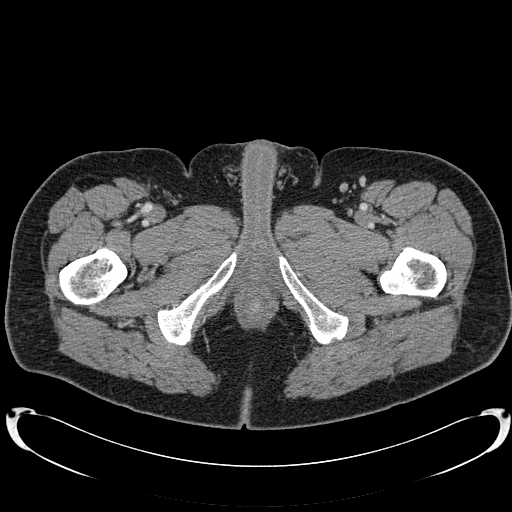
[im 40/984  bone]
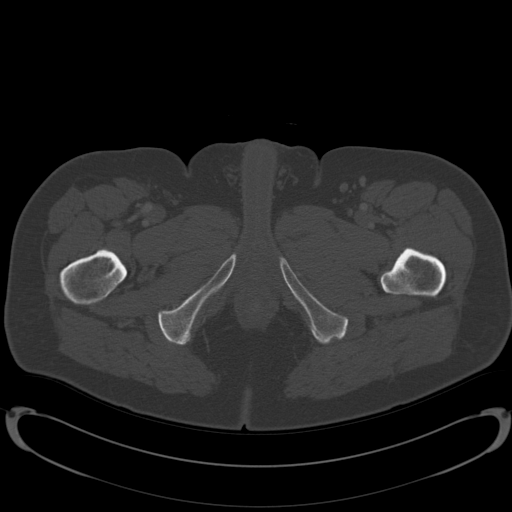
[im 118/984  soft-tissue]
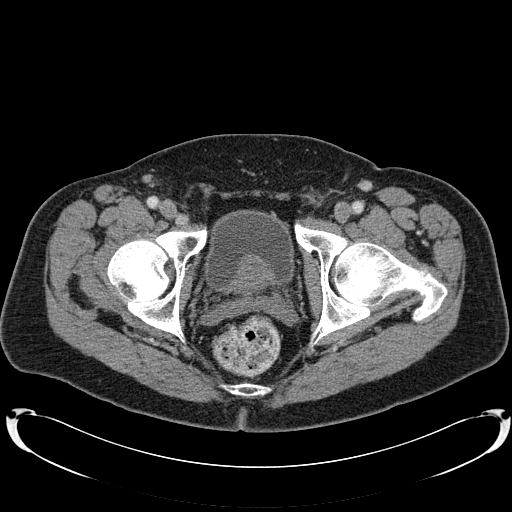
[im 197/984  soft-tissue]
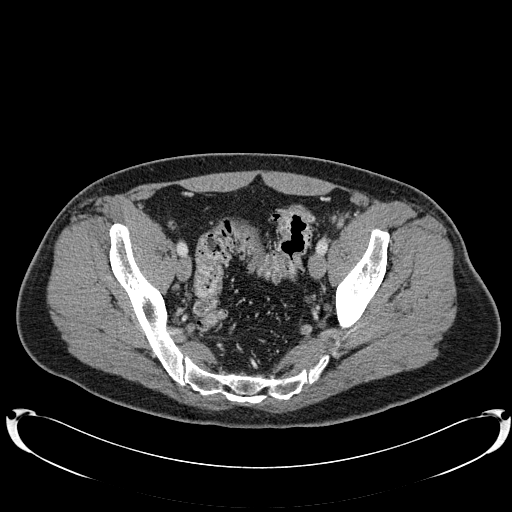
[im 276/984  soft-tissue]
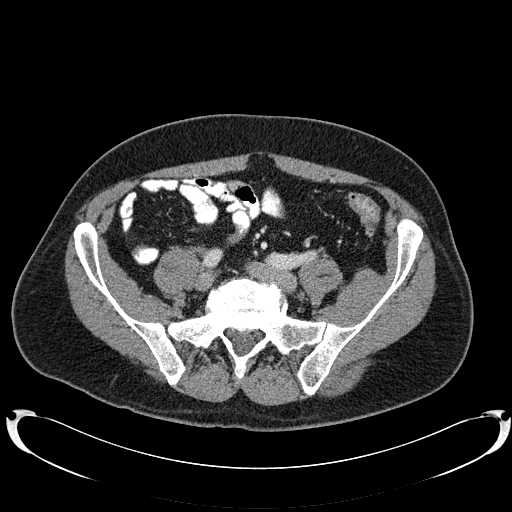
[im 354/984  soft-tissue]
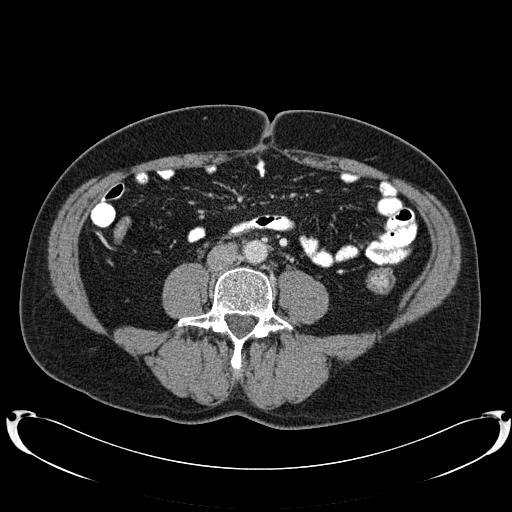
[im 433/984  soft-tissue]
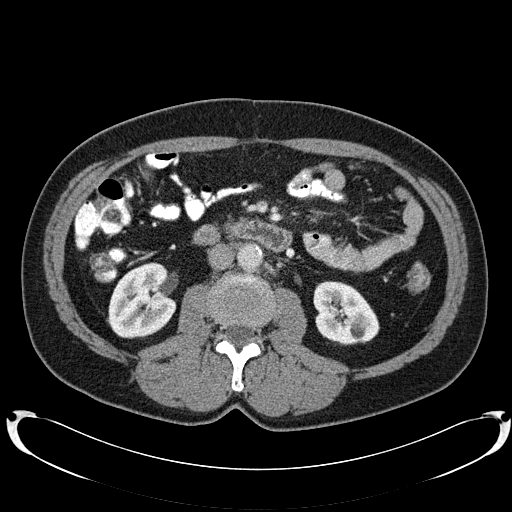
[im 512/984  soft-tissue]
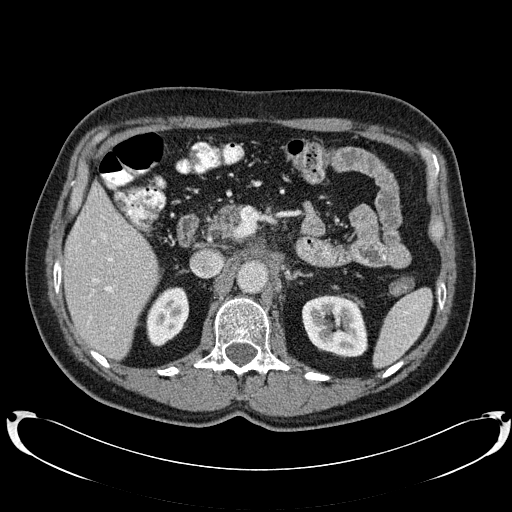
[im 551/984  soft-tissue]
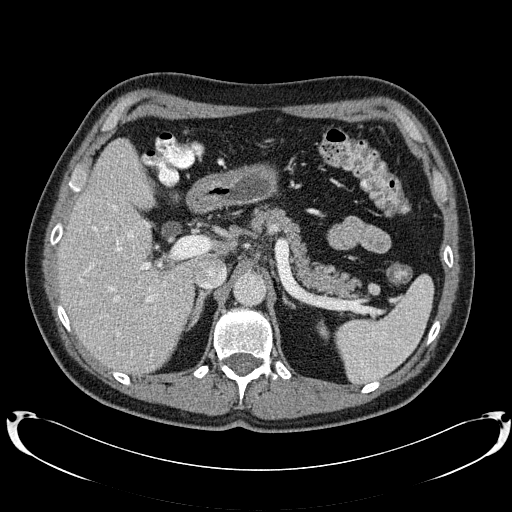
[im 630/984  soft-tissue]
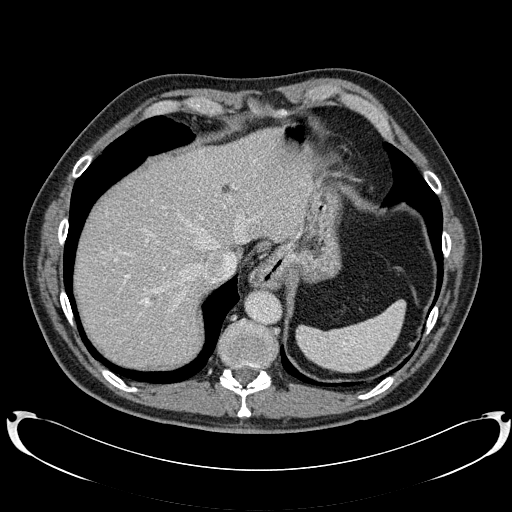
[im 630/984  bone]
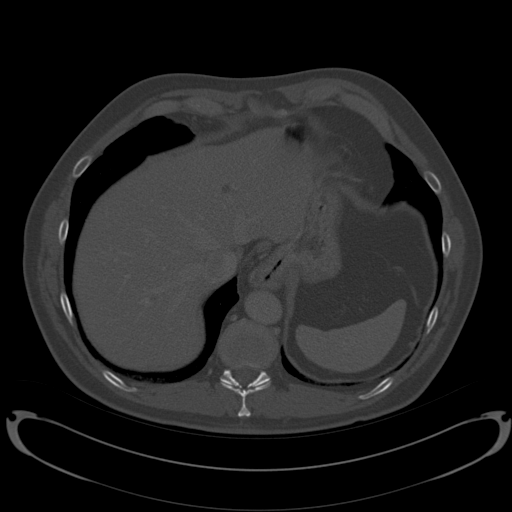
[im 708/984  soft-tissue]
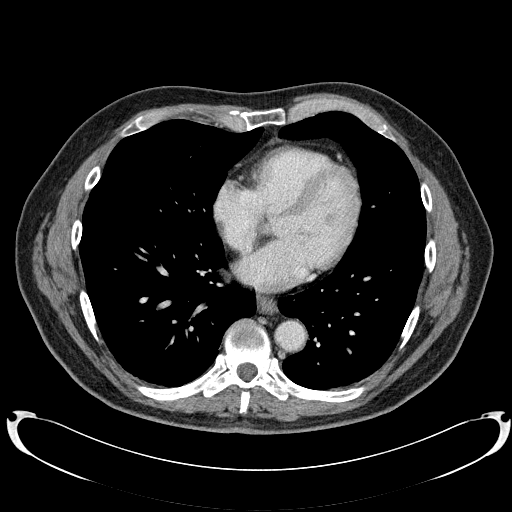
[im 787/984  soft-tissue]
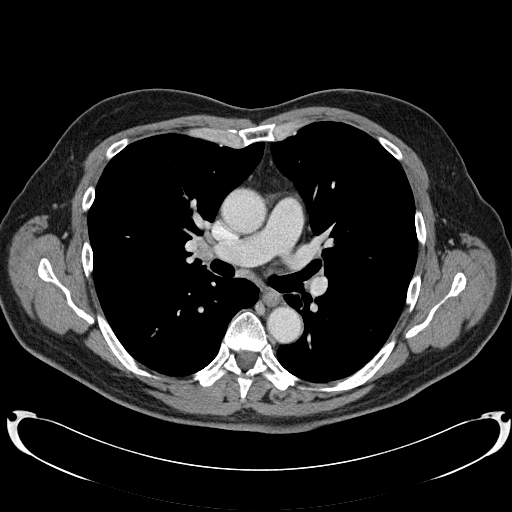
[im 866/984  soft-tissue]
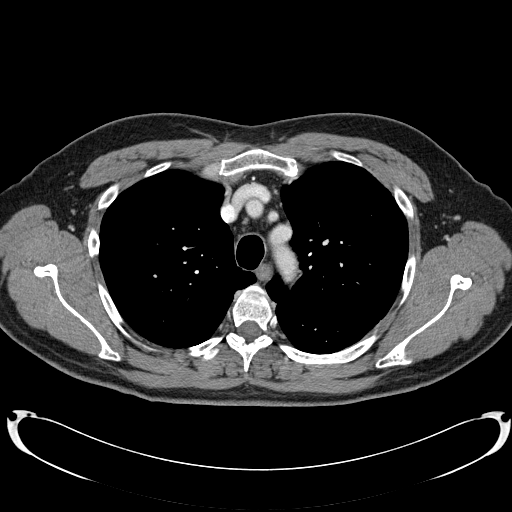
[im 944/984  soft-tissue]
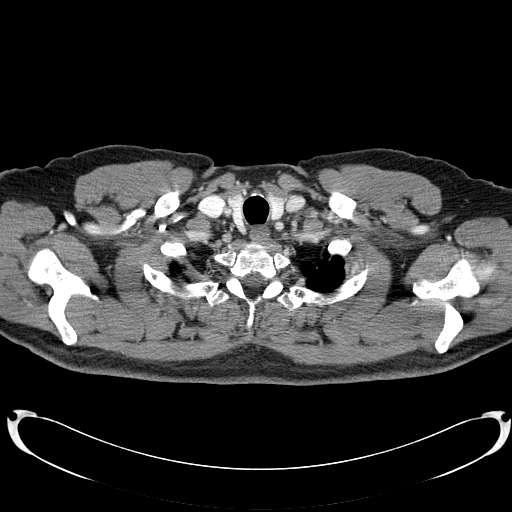

[Series 602: <mpr thick range> · coronal · 1.35mm/px · 3 of 39 slices shown]
[im 13/39  soft-tissue]
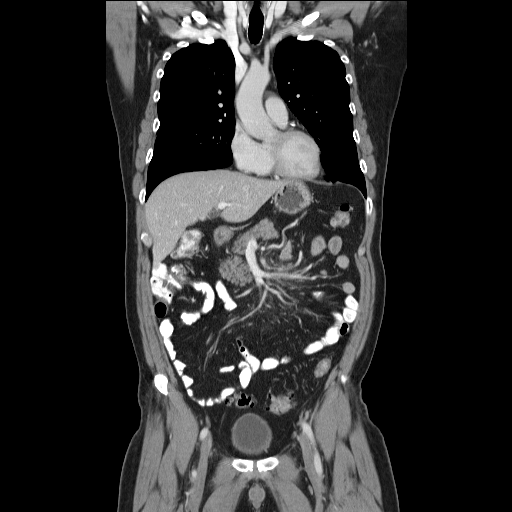
[im 17/39  soft-tissue]
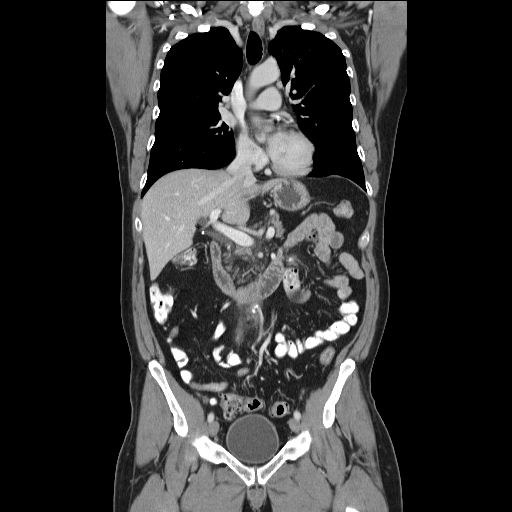
[im 22/39  soft-tissue]
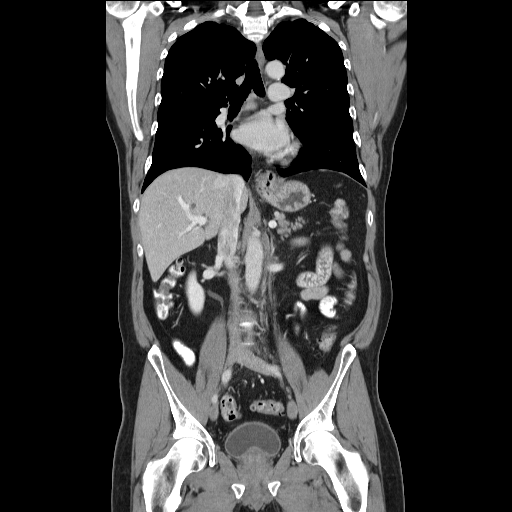

[16 of 46 positions shown; findings below may reference images not displayed]

FINDINGS: Cardiomediastinal silhouette is normal in size and configuration.  A few small mediastinal lymph nodes are stable, none of which meets CT criteria for pathologic enlargement.   A small hiatal hernia is again noted.  A few peripheral bullae are noted in the left upper lobe and left lower lobe, respectively.  No pulmonary nodule is seen.  No effusion.  No axillary lymphadenopathy is present.
IMPRESSION: No CT evidence for active disease of the chest.   
 ABDOMEN CT WITH CONTRAST:
FINDINGS: Again noted are 3 too small to characterize hypodensities in the liver, unchanged. The patient has had cholecystectomy, with mild persistent prominence of the common duct, as expected given postoperative change.  The spleen, pancreas, adrenal glands, and right kidney are unremarkable.  Again noted is a 1.2 cm left lower renal pole cyst.  There is stable mesenteric and retroperitoneal stranding and a few mesenteric lymph nodes are identified. Overall, these findings are unchanged.
IMPRESSION: 1.  Stable mesenteric stranding, small lymph nodes, with minimal retroperitoneal involvement, consistent with known lymphoma.  
 2.  No new lesion is identified. 
 PELVIS CT WITH CONTRAST:
FINDINGS: No dilated loop of bowel or bowel wall thickening is identified.  The prostate is persistently enlarged, measuring 5 cm in maximal transverse dimension, producing impression upon the base of the bladder.  Bilateral prominent inguinal lymph nodes, left greater than right, are again identified, unchanged.  Osseous structures are intact.
IMPRESSION: Stable appearance of bilateral inguinal lymph nodes without CT evidence for new pelvic disease.

## 2005-03-12 ENCOUNTER — Ambulatory Visit (HOSPITAL_COMMUNITY): Admission: RE | Admit: 2005-03-12 | Discharge: 2005-03-12 | Payer: Self-pay | Admitting: Oncology

## 2005-03-12 IMAGING — CT CT CHEST W/ CM
2 of 5 series · 15 of 36 positions shown, 18 images · IV contrast (omnipaque)
Comparison: [DATE].

CLINICAL DATA: Non-Hodgkin?s lymphoma.
CHEST CT WITH CONTRAST:
TECHNIQUE: Multidetector CT imaging of the chest was performed following the standard protocol during bolus administration of intravenous contrast.
Contrast:  125 cc Omnipaque 300
TECHNIQUE: Multidetector CT imaging of the abdomen was performed following the standard protocol during bolus administration of intravenous contrast.
TECHNIQUE: Multidetector CT imaging of the pelvis was performed following the standard protocol during bolus administration of intravenous contrast.

[Series 2: cap 5.0 b40s st · axial · 0.78mm/px · z∈[-644,-64]mm · 12 of 134 slices shown, 15 images]
[im 9/134  mediastinal]
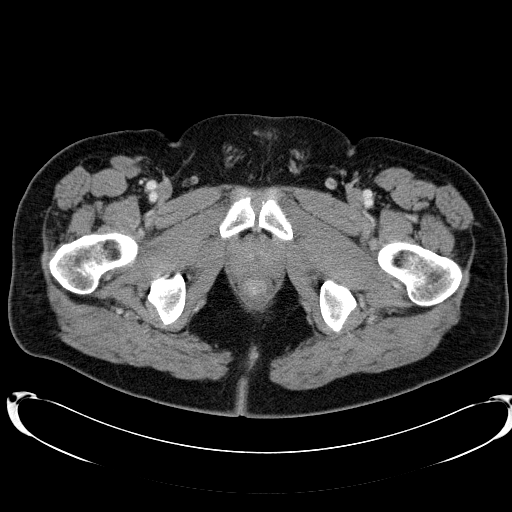
[im 9/134  lung]
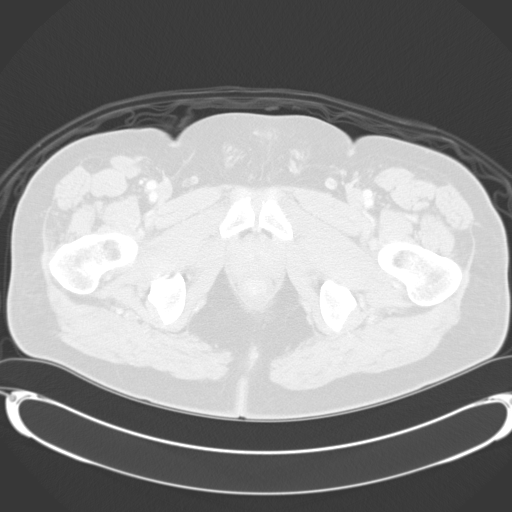
[im 17/134  lung]
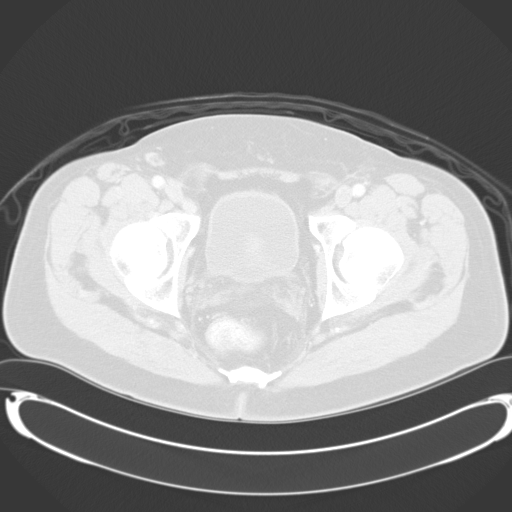
[im 34/134  lung]
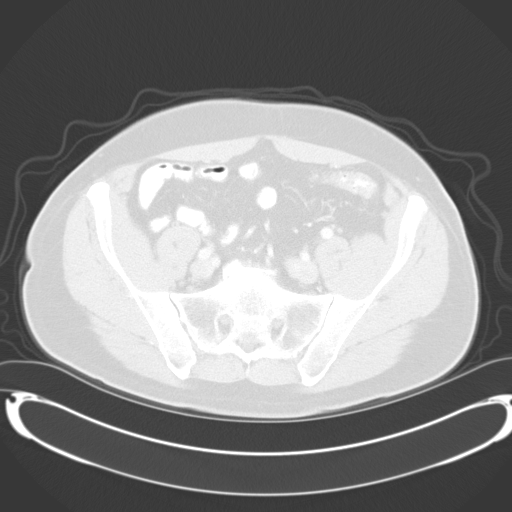
[im 42/134  lung]
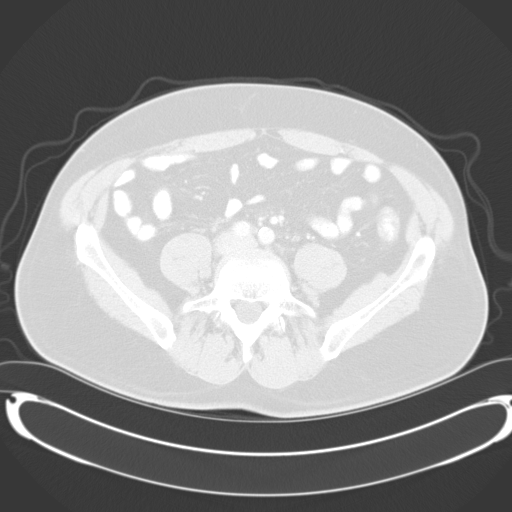
[im 50/134  mediastinal]
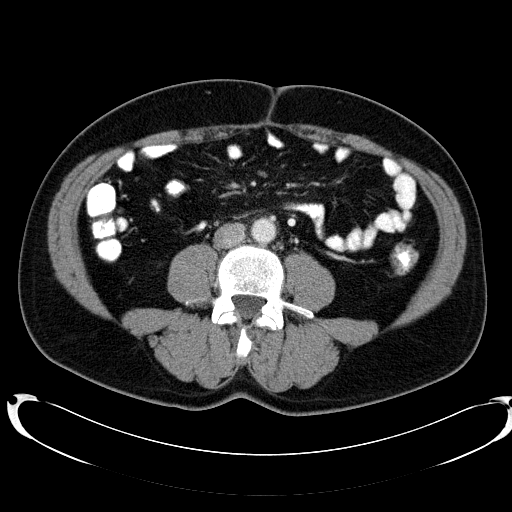
[im 50/134  lung]
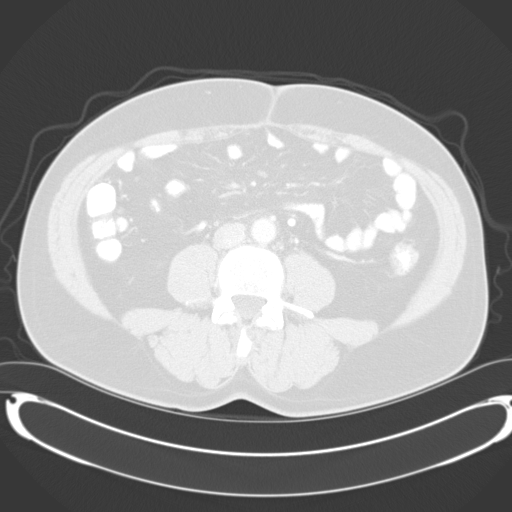
[im 59/134  lung]
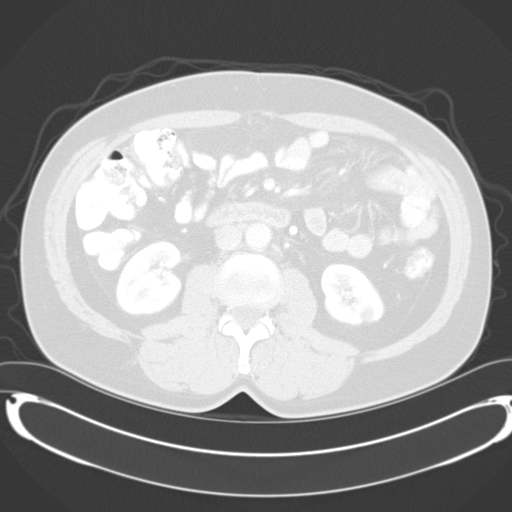
[im 75/134  lung]
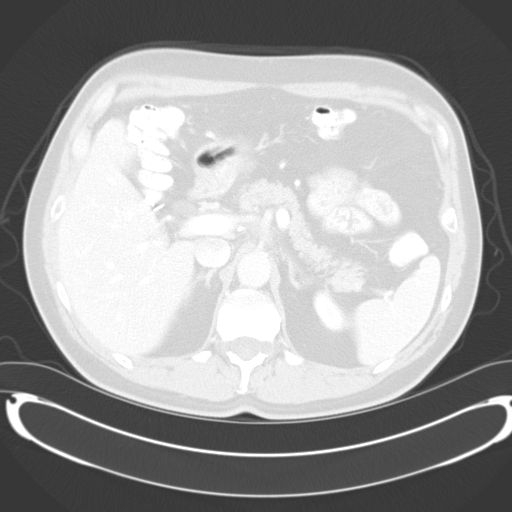
[im 84/134  lung]
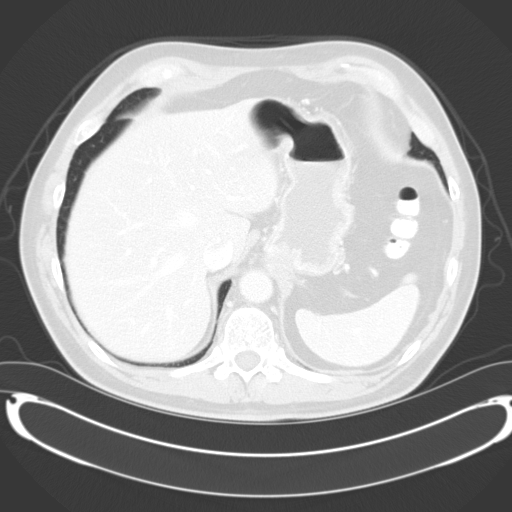
[im 92/134  mediastinal]
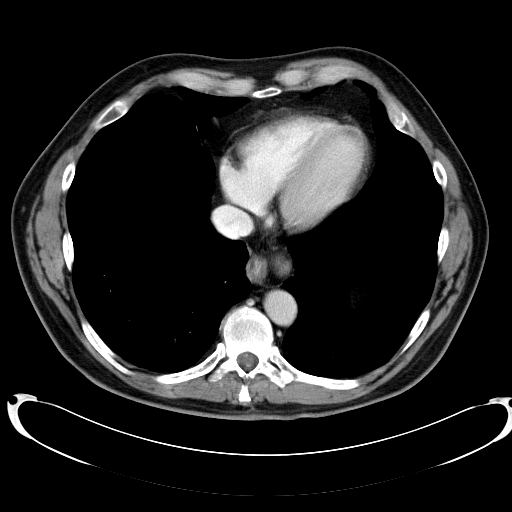
[im 92/134  lung]
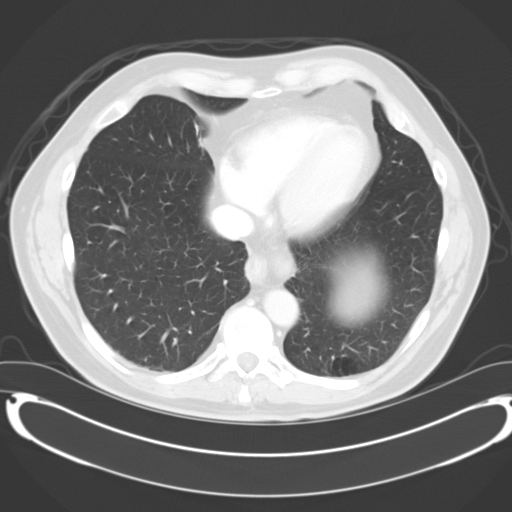
[im 100/134  lung]
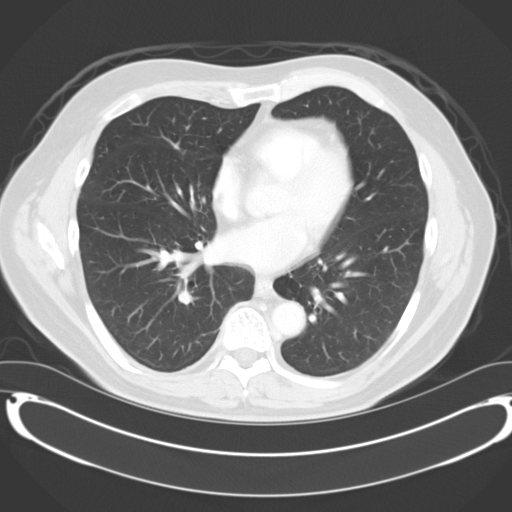
[im 117/134  lung]
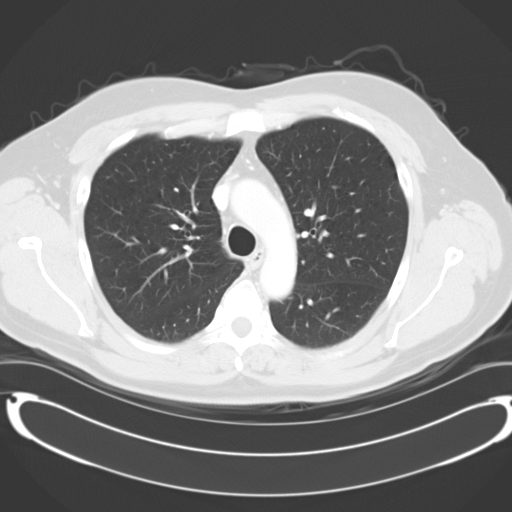
[im 125/134  lung]
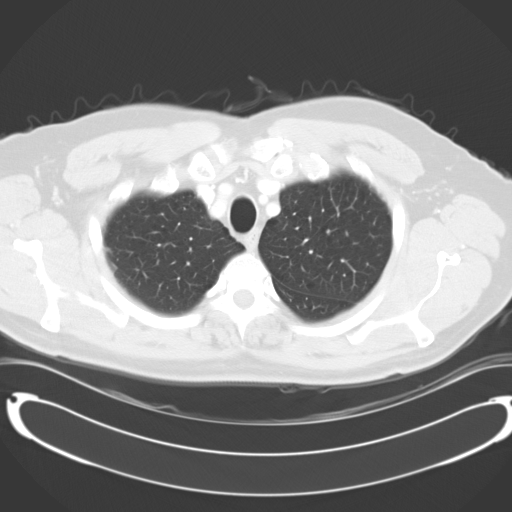

[Series 602: coronal · coronal · 1.34mm/px · 3 of 39 slices shown]
[im 8/39  lung]
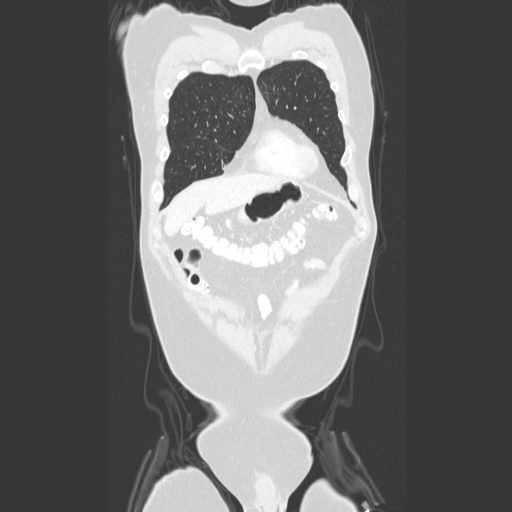
[im 16/39  lung]
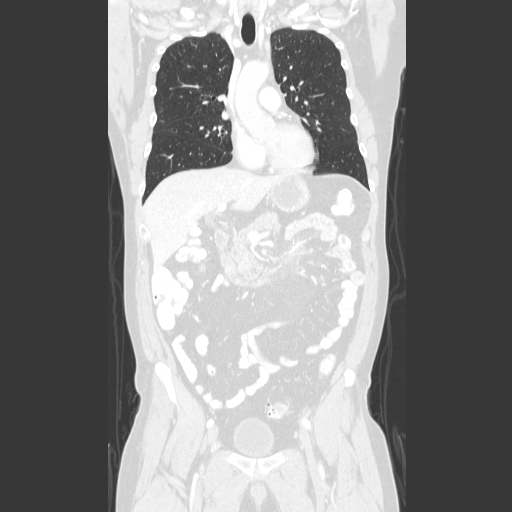
[im 23/39  lung]
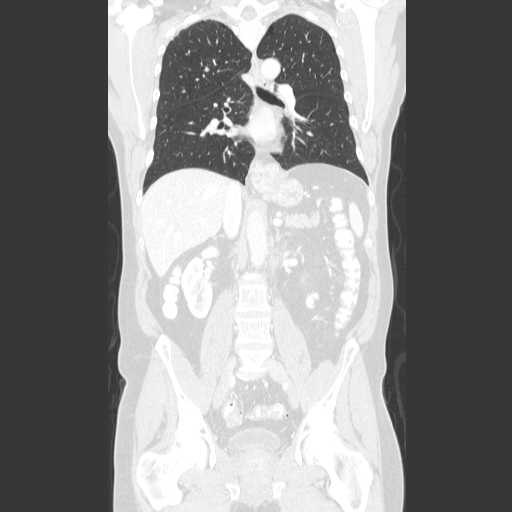

[15 of 36 positions shown; findings below may reference images not displayed]

FINDINGS: There is no evidence for axillary, mediastinal or hilar adenopathy.  Heart size is normal.  No pericardial or pleural effusion.
Lung windows are stable.  No new parenchymal nodules or masses.  There are two very tiny nodular densities in the left lung (images 26 and 38). The lingular nodule is smaller than on a CT chest from [DATE] and the left lower lobe tiny nodular density is unchanged.  
A small hiatal hernia is again noted.
IMPRESSION: Stable exam.  No evidence for adenopathy or abnormal soft tissue within the chest.
ABDOMEN CT WITH CONTRAST:
FINDINGS: Two tiny low density lesions in the left liver are unchanged since [DATE] exam and are compatible with tiny hepatic cysts.  Third similar lesion is seen in the inferior tip of the right liver.  There is trace intrahepatic biliary dilatation with prominence of the extrahepatic common duct but this is not uncommon in patients that are status post cholecystectomy.  The spleen, duodenum, pancreas, adrenal glands, common right kidney are unremarkable. A low density lesion in the lower pole of the left kidney is unchanged and remains compatible with a small cyst.  
As seen on the [DATE] study and [DATE] exam, there is abnormal soft tissue in the central retroperitoneum between the celiac axis and the SMA with ill-defined soft tissue nodules in the periaortic space and some stranding within the central small bowel mesentery.  The imaging features are stable and the PET CT of [DATE] demonstrated no abnormal FDG accumulation in these tissues.
IMPRESSION: Stable exam.  The abnormal soft tissue attenuation in the retroperitoneal space and central mesentery is unchanged and demonstrated no abnormal FDG uptake above background levels on the recent PET scan.
PELVIS CT WITH CONTRAST:
FINDINGS: No evidence for adenopathy.  Diverticular change characterizes the sigmoid colon without evidence for diverticulitis.  No intraperitoneal free fluid.  The terminal ileum is unremarkable.
The fluid density structures present within the neural foramen of the lower thoracic spine are unchanged since [DATE] and are consistent with small incidental lateral meningoceles.
IMPRESSION: 1.  Stable CT examination of the anatomic pelvis.  There is no evidence for adenopathy and there are no new findings.
2.  Sigmoid diverticulosis.

## 2005-07-26 ENCOUNTER — Ambulatory Visit (HOSPITAL_COMMUNITY): Admission: RE | Admit: 2005-07-26 | Discharge: 2005-07-26 | Payer: Self-pay | Admitting: Oncology

## 2005-07-26 IMAGING — CT CT CHEST W/ CM
1 of 3 series · 13 of 32 positions shown, 18 images · non-contrast
Comparison: [DATE]

CLINICAL DATA: Lymphoma
TECHNIQUE: Multidetector CT imaging of the chest, abdomen and pelvis was
performed following the standard protocol during bolus administration of
intravenous contrast.

[Series 2: cap 5.0 b30f · axial · 0.79mm/px · z∈[-640,-45]mm · 13 of 135 slices shown, 18 images]
[im 8/135  soft-tissue]
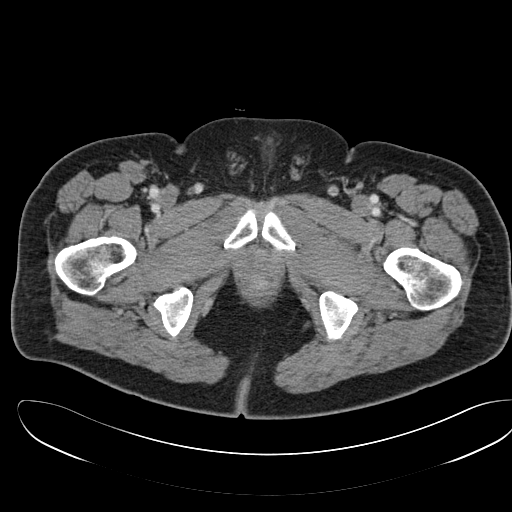
[im 8/135  bone]
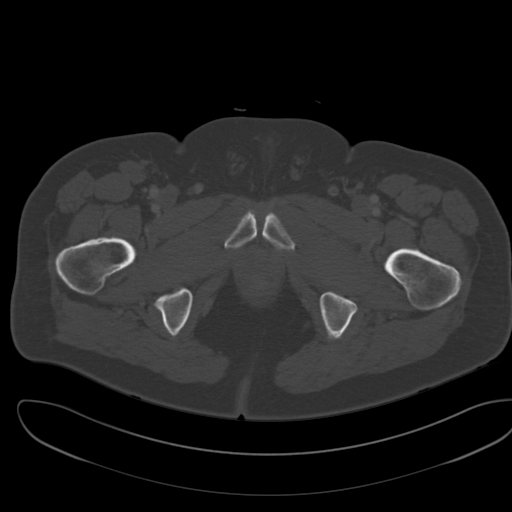
[im 24/135  soft-tissue]
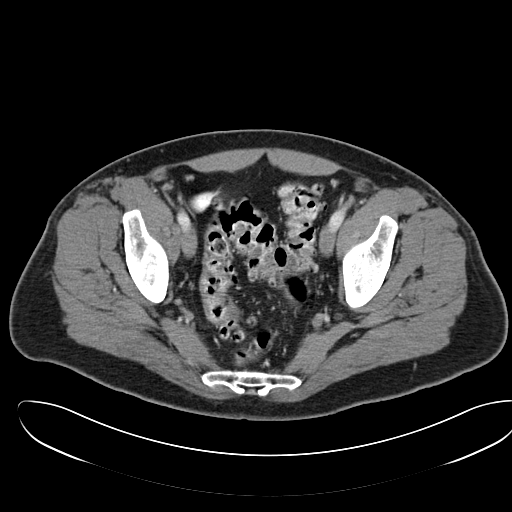
[im 32/135  soft-tissue]
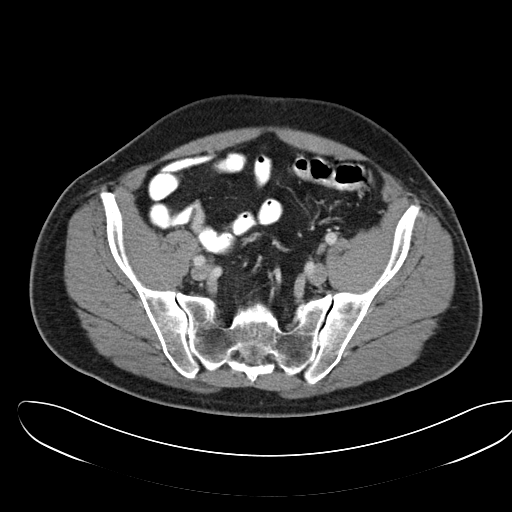
[im 40/135  soft-tissue]
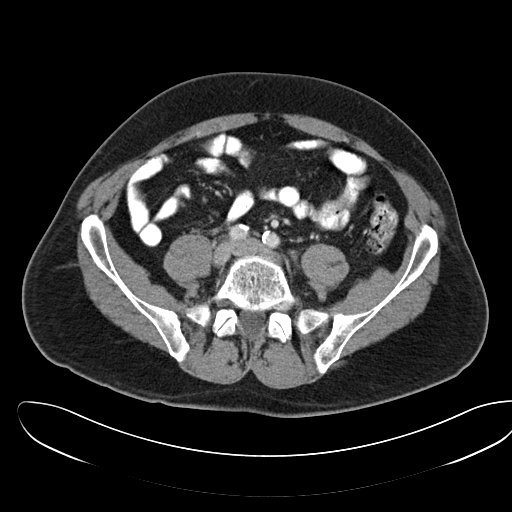
[im 56/135  soft-tissue]
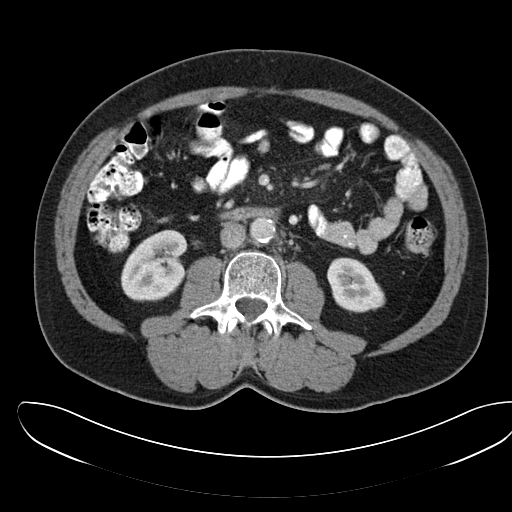
[im 64/135  soft-tissue]
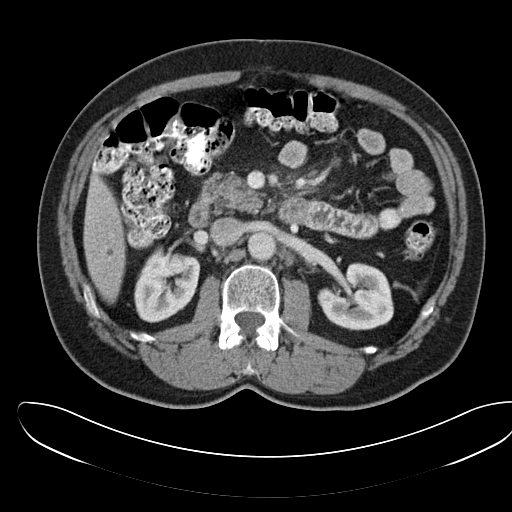
[im 71/135  soft-tissue]
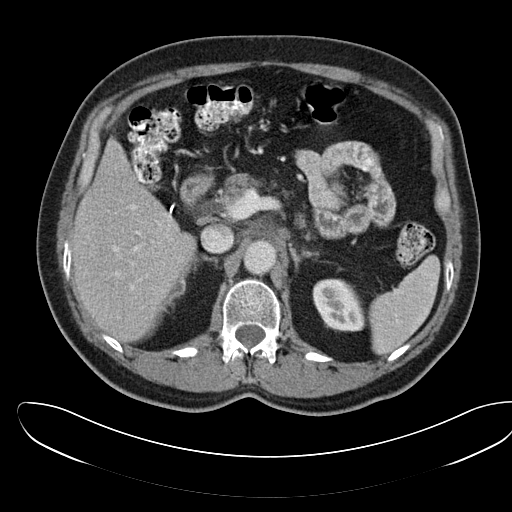
[im 87/135  soft-tissue]
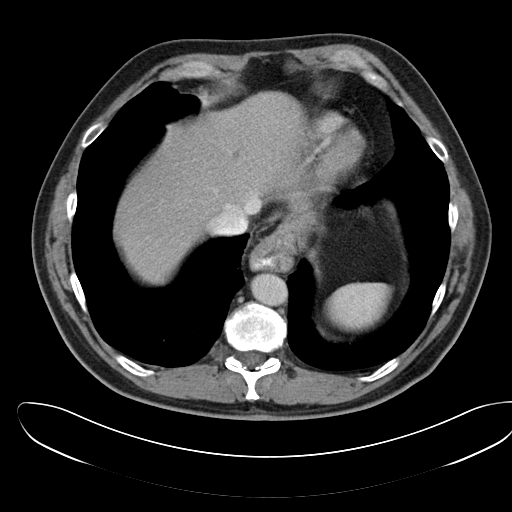
[im 95/135  soft-tissue]
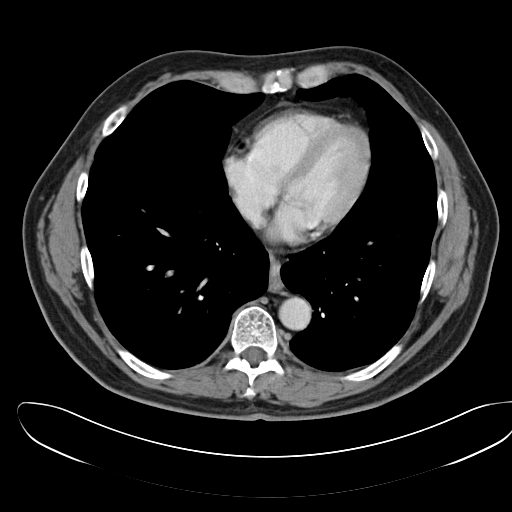
[im 95/135  bone]
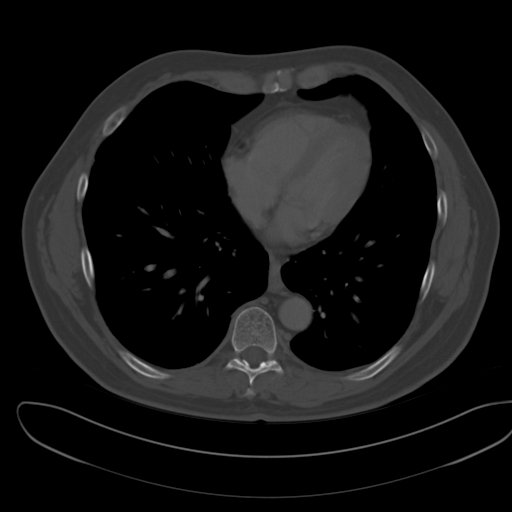
[im 103/135  soft-tissue]
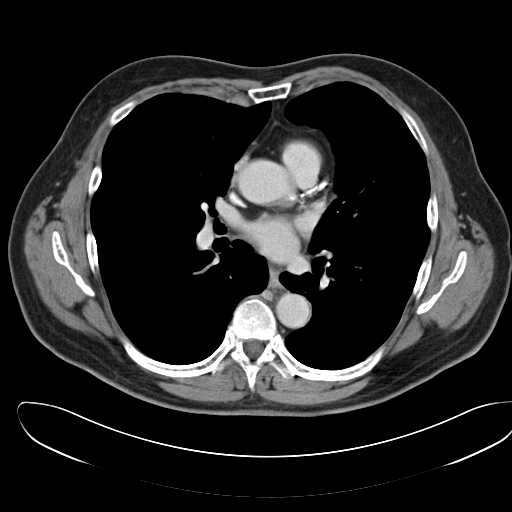
[im 103/135  lung]
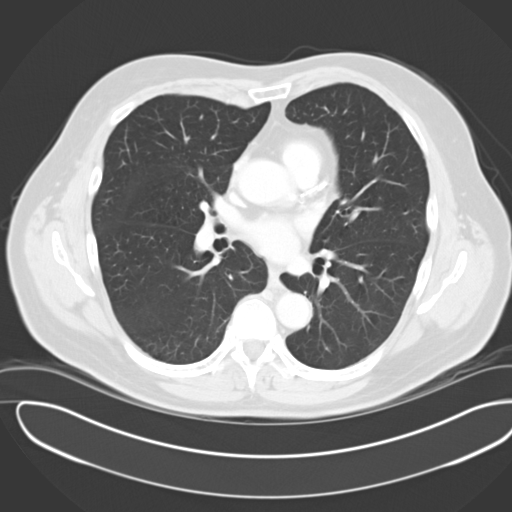
[im 111/135  lung]
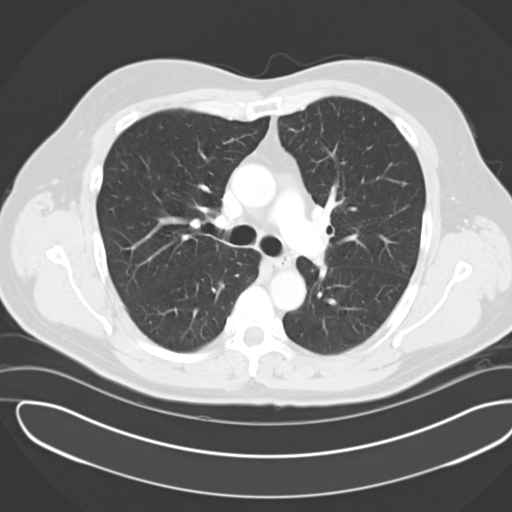
[im 119/135  soft-tissue]
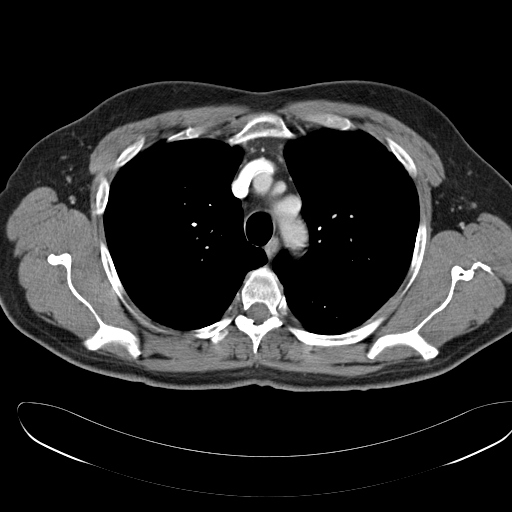
[im 119/135  lung]
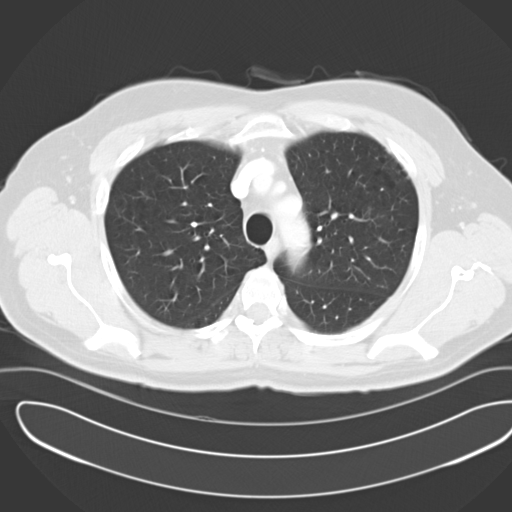
[im 127/135  soft-tissue]
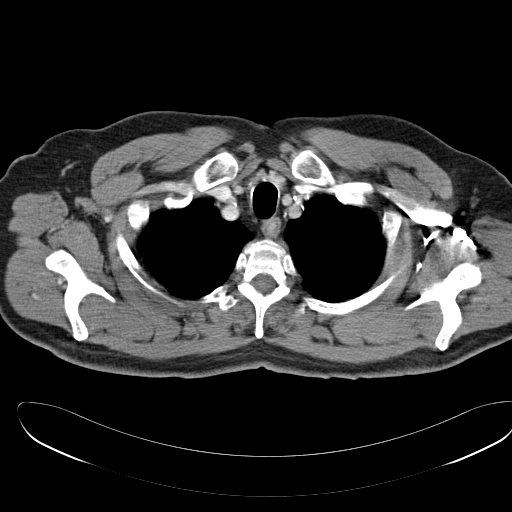
[im 127/135  lung]
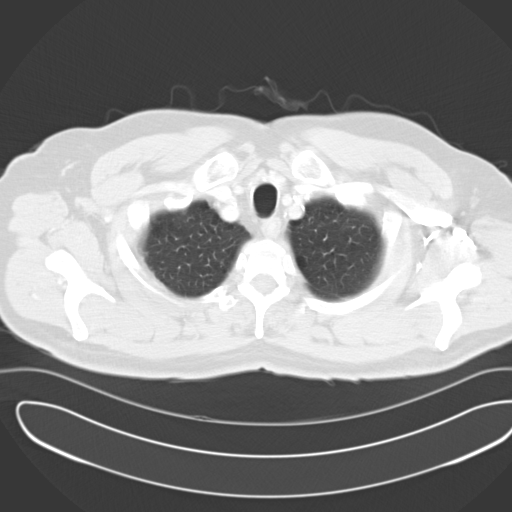

[13 of 32 positions shown; findings below may reference images not displayed]

Contrast:  125 cc Omnipaque 300

CHEST CT WITH CONTRAST:

No lymphadenopathy the chest. The heart size is normal. Coronary artery
calcification noted. No pericardial nor pleural effusion. A small hiatal hernia
persists.

Lung windows reveal no new parenchymal nodules or masses. The tiny left lung
nodules described previously are unchanged, seen anteriorly in the left upper
lobe on image 28 and laterally in the left lower lobe on image 43.
IMPRESSION: Stable exam. No lymphadenopathy. No new or acute findings.

Small hiatal hernia.

ABDOMEN CT WITH CONTRAST:

Three very tiny low density liver lesions are unchanged in the interval. Mild
biliary prominence is stable in this patient status post cholecystectomy. The
spleen, duodenum, pancreas, adrenal glands, and right kidney are unremarkable.
Tiny low density lesion in the left kidney is stable.

Retroperitoneal and mesenteric soft tissue stranding seen on the previous study
is slightly less conspicuous on today's exam. No discrete lymphadenopathy is
identified in the abdomen.

No interval change in the small meningoceles seen in the foramina of the lower
thoracic spine.
IMPRESSION: Stable exam. Stranding within the retroperitoneal and mesenteric spaces is
subjectively slightly decreased in the interval, but is not substantially
changed. No new or acute findings are identified.

PELVIS CT WITH CONTRAST:

A right common femoral lymph node has increased minimally in the interval,
measuring 9 mm in short axis on the previous exam and now measuring 11 mm. No
other adenopathy identified in the anatomic pelvis. Diverticular change noted in
the sigmoid colon without diverticulitis. Prostate gland is mildly enlarged. No
free intraperitoneal fluid.
IMPRESSION: A right common femoral lymph node has increased minimally in size since the
previous study. There is no other adenopathy the anatomic pelvis. While this may
be reactive, close attention is recommended to ensure no further progression.

## 2005-11-16 ENCOUNTER — Ambulatory Visit (HOSPITAL_COMMUNITY): Admission: RE | Admit: 2005-11-16 | Discharge: 2005-11-16 | Payer: Self-pay | Admitting: Oncology

## 2005-11-16 IMAGING — CT CT PELVIS W/ CM
2 of 5 series · 16 of 46 positions shown, 18 images · IV contrast (omnipaque)
Comparison: [DATE].
COMPARISON: [DATE].
COMPARISON: [DATE].

CLINICAL DATA: Followup non-Hodgkin?s lymphoma. 
 CHEST CT WITH CONTRAST:
TECHNIQUE: Multidetector CT imaging of the chest was performed following the standard protocol during bolus administration of intravenous contrast.
 Contrast:  125 cc Omnipaque 300
TECHNIQUE: Multidetector CT imaging of the abdomen was performed following the standard protocol during bolus administration of intravenous contrast.
TECHNIQUE: Multidetector CT imaging of the pelvis was performed following the standard protocol during bolus administration of intravenous contrast.

[Series 2: cap 5.0 b40f · axial · 0.70mm/px · z∈[-690,-86]mm · 13 of 137 slices shown, 15 images]
[im 8/137  soft-tissue]
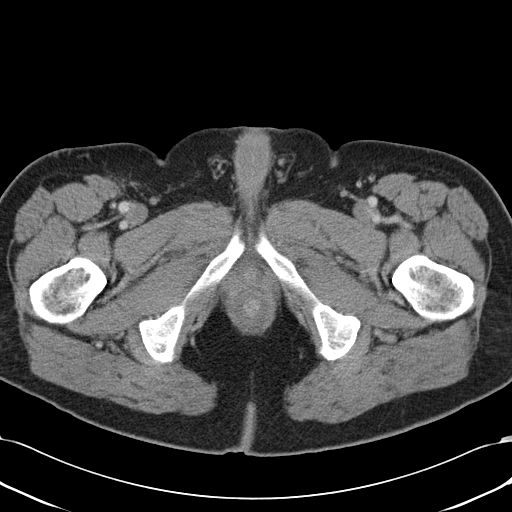
[im 8/137  bone]
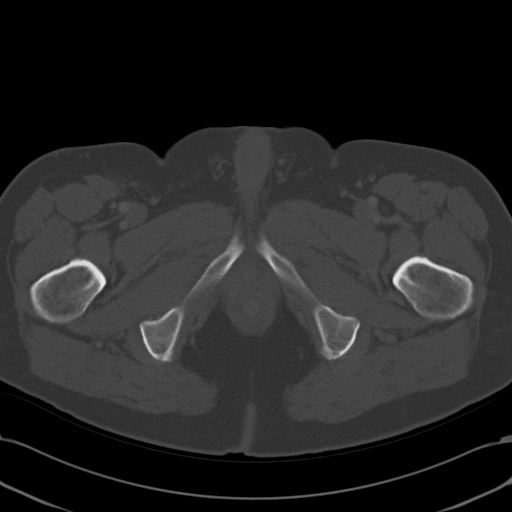
[im 22/137  soft-tissue]
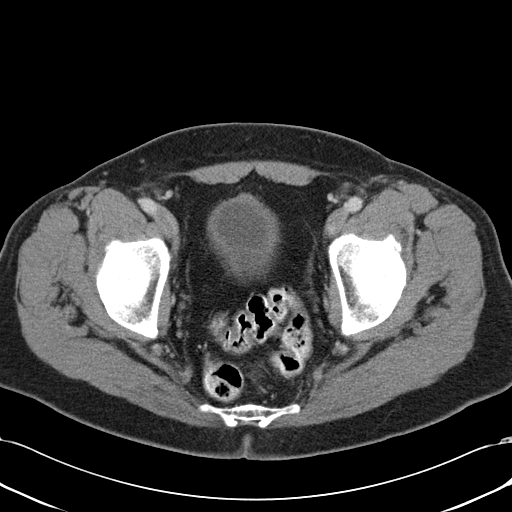
[im 29/137  soft-tissue]
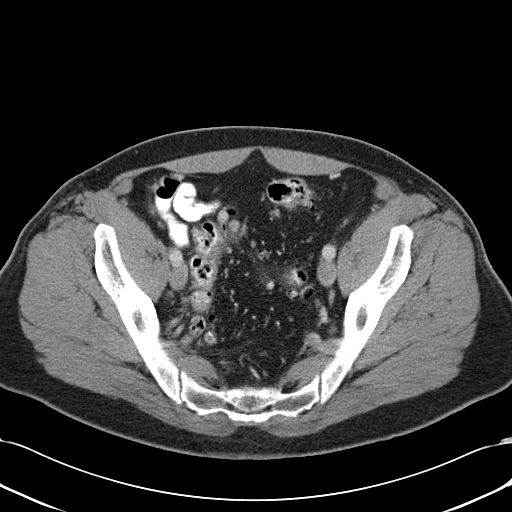
[im 36/137  soft-tissue]
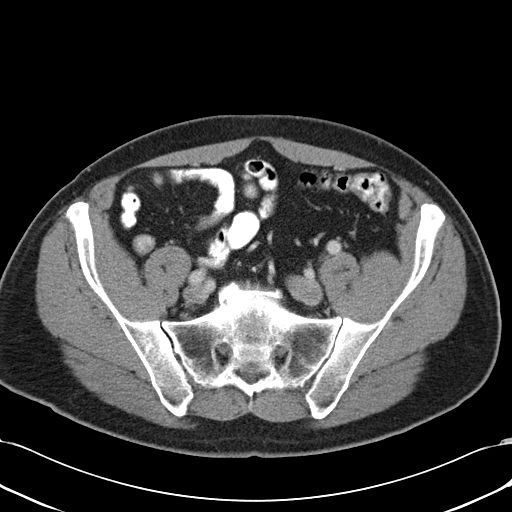
[im 51/137  soft-tissue]
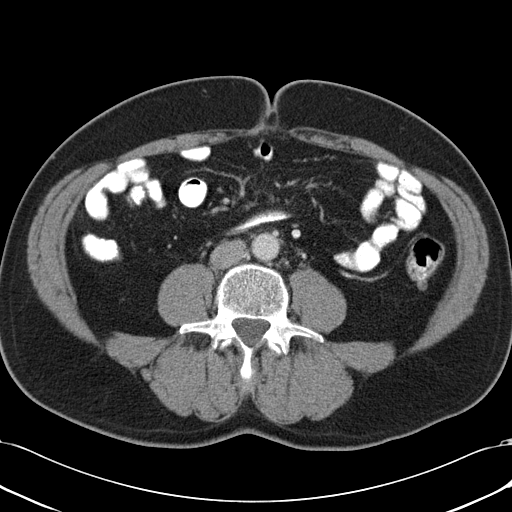
[im 58/137  soft-tissue]
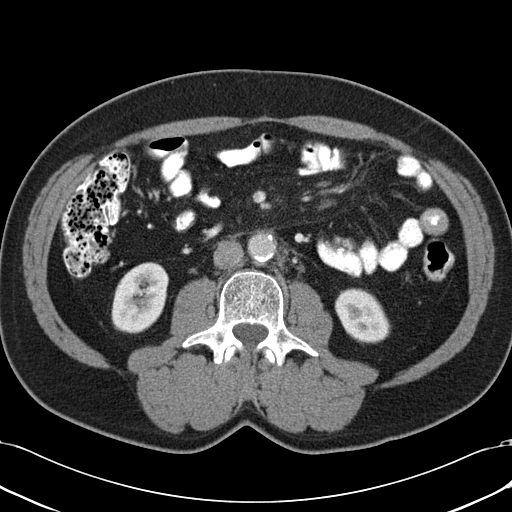
[im 72/137  soft-tissue]
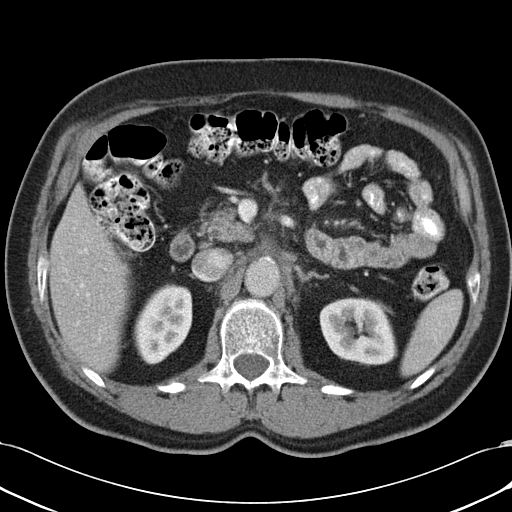
[im 79/137  soft-tissue]
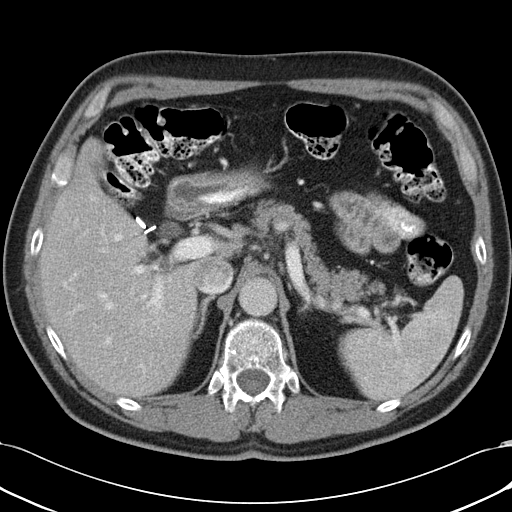
[im 86/137  soft-tissue]
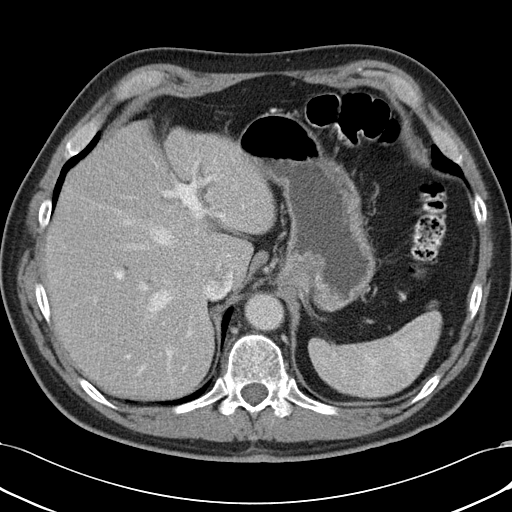
[im 86/137  bone]
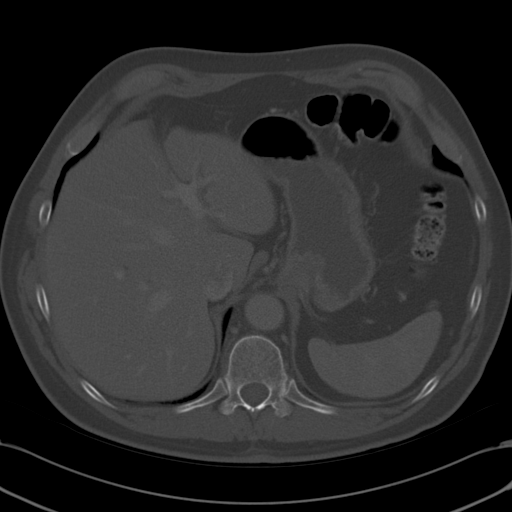
[im 101/137  soft-tissue]
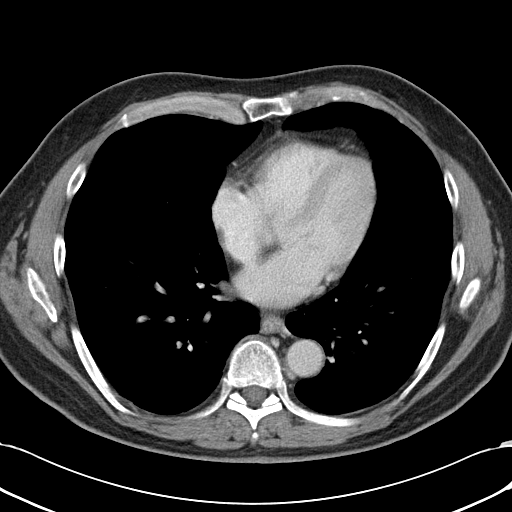
[im 108/137  soft-tissue]
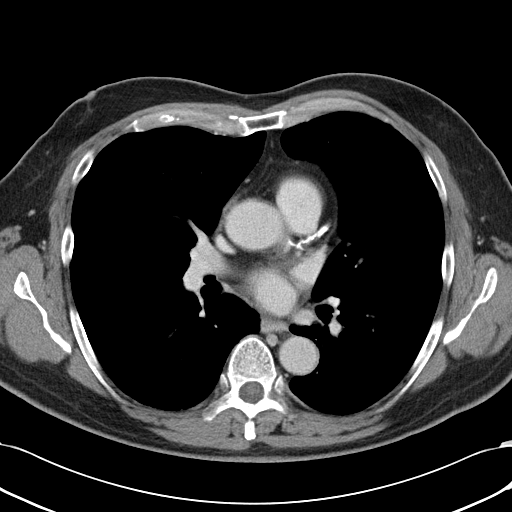
[im 115/137  soft-tissue]
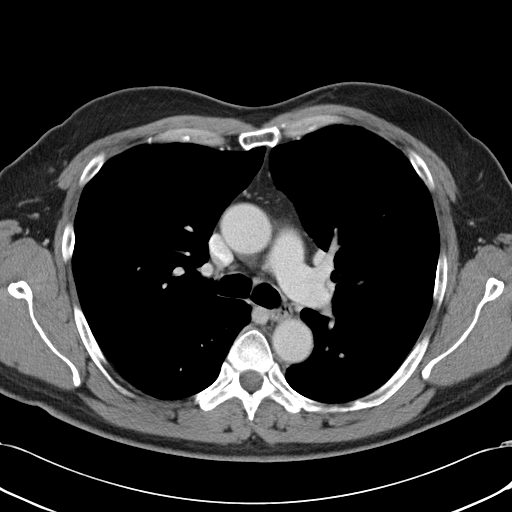
[im 129/137  soft-tissue]
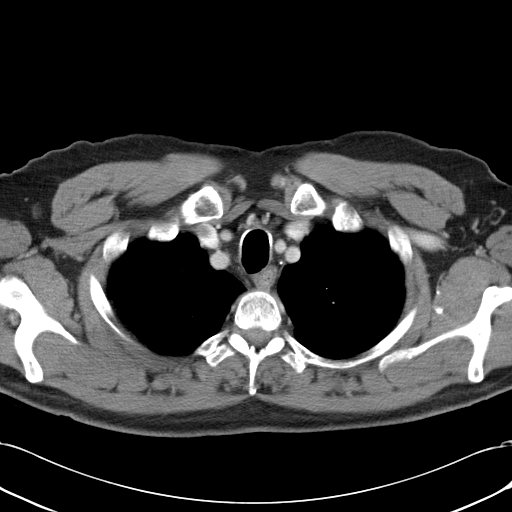

[Series 4: mpr coronal cap · coronal · 1.52mm/px · 3 of 81 slices shown]
[im 27/81  soft-tissue]
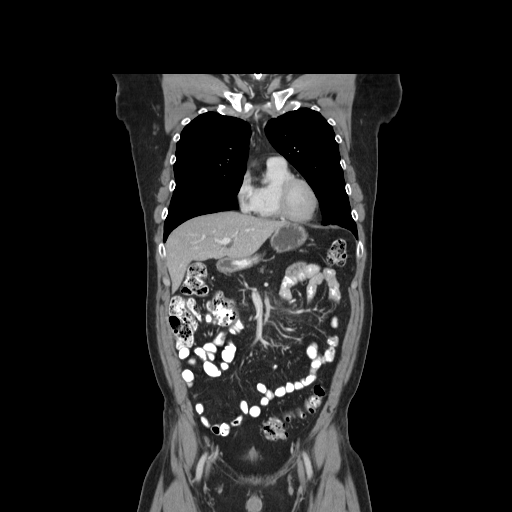
[im 36/81  soft-tissue]
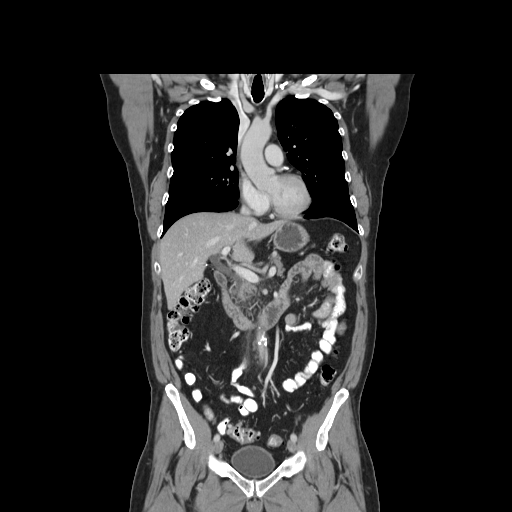
[im 45/81  soft-tissue]
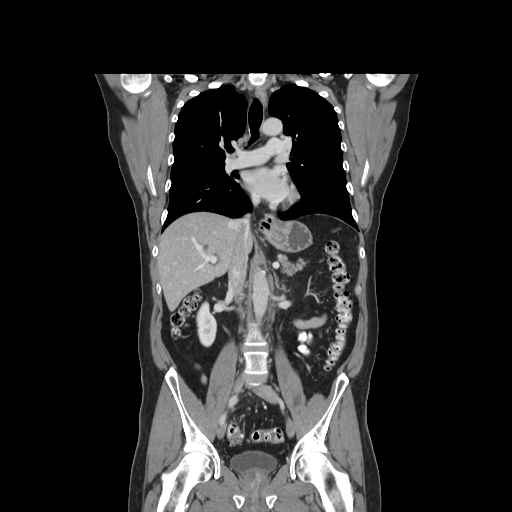

[16 of 46 positions shown; findings below may reference images not displayed]

FINDINGS: There is no mediastinal, hilar, supraclavicular, or axillary adenopathy.  There is a hiatal hernia present which is unchanged.  There are no pleural or pericardial effusions.  Coronary artery calcification is again noted.  The tiny left lung nodule located within the left upper lobe on image #26 and located within the left lower lobe on image #39 are unchanged.   There are no new nodules.
IMPRESSION: Stable appearance of the chest with stable tiny left lung nodules.  No evidence for recurrent lymphoma.  Stable hiatal hernia. 
 ABDOMEN CT WITH CONTRAST:
FINDINGS: The stranding seen within the retroperitoneal and mesenteric spaces appears unchanged.  No discrete measurable adenopathy is present.  There are three tiny low density liver lesions again noted which appear stable.  The spleen, pancreas, kidneys, and adrenal glands are stable as well with a small stable left renal cyst.  The lateral lower thoracic meningoceles are also stable.
IMPRESSION: Stable appearance of the abdomen with stranding within the retroperitoneal and mesenteric spaces.  No new findings.  
 PELVIS CT WITH CONTRAST:
FINDINGS: The right common femoral lymph node is stable in size and contour measuring 11 mm in short axis on image #120.  There is no iliac chain adenopathy.  There is no pelvic mass or free pelvic fluid.  Sigmoid colonic diverticulosis is noted.  No evidence for diverticulitis.
IMPRESSION: Stable right common femoral lymph node.  Remainder of the CT of the pelvis is unchanged as well.

## 2005-11-18 ENCOUNTER — Ambulatory Visit: Payer: Self-pay | Admitting: Oncology

## 2006-05-16 ENCOUNTER — Ambulatory Visit: Payer: Self-pay | Admitting: Oncology

## 2006-05-18 ENCOUNTER — Ambulatory Visit (HOSPITAL_COMMUNITY): Admission: RE | Admit: 2006-05-18 | Discharge: 2006-05-18 | Payer: Self-pay | Admitting: Oncology

## 2006-05-18 LAB — CBC WITH DIFFERENTIAL/PLATELET
Basophils Absolute: 0 10*3/uL (ref 0.0–0.1)
HCT: 47.9 % (ref 38.7–49.9)
HGB: 17 g/dL (ref 13.0–17.1)
MONO#: 0.5 10*3/uL (ref 0.1–0.9)
NEUT%: 63.4 % (ref 40.0–75.0)
WBC: 5.6 10*3/uL (ref 4.0–10.0)
lymph#: 1.4 10*3/uL (ref 0.9–3.3)

## 2006-05-18 LAB — COMPREHENSIVE METABOLIC PANEL
ALT: 26 U/L (ref 0–53)
AST: 26 U/L (ref 0–37)
Alkaline Phosphatase: 51 U/L (ref 39–117)
BUN: 16 mg/dL (ref 6–23)
Calcium: 9.4 mg/dL (ref 8.4–10.5)
Chloride: 106 mEq/L (ref 96–112)
Creatinine, Ser: 1.04 mg/dL (ref 0.40–1.50)
Potassium: 5 mEq/L (ref 3.5–5.3)

## 2006-05-18 IMAGING — CT CT CHEST W/ CM
2 of 5 series · 16 of 46 positions shown, 18 images · IV contrast (omnipaque)
Comparison: [DATE]

CHEST CT WITH CONTRAST

CLINICAL DATA: Non-Hodgkin's lymphoma
TECHNIQUE: Multidetector CT imaging of the chest, abdomen, and pelvis was
performed following the standard protocol during bolus administration of
intravenous contrast.

Contrast:  125 cc Omnipaque 300

[Series 2: cap 5.0 b40f · axial · 0.79mm/px · z∈[-668,-68]mm · 13 of 136 slices shown, 15 images]
[im 8/136  soft-tissue]
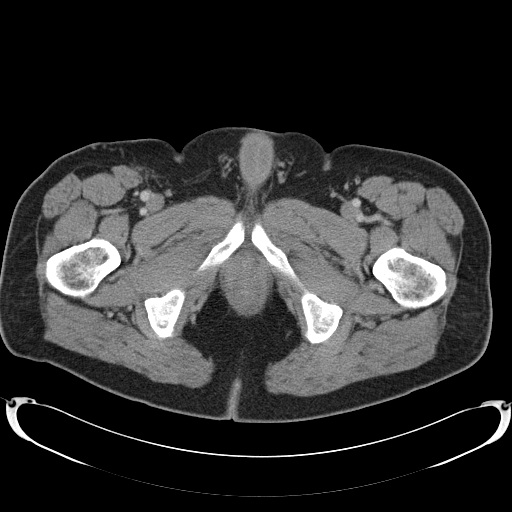
[im 8/136  bone]
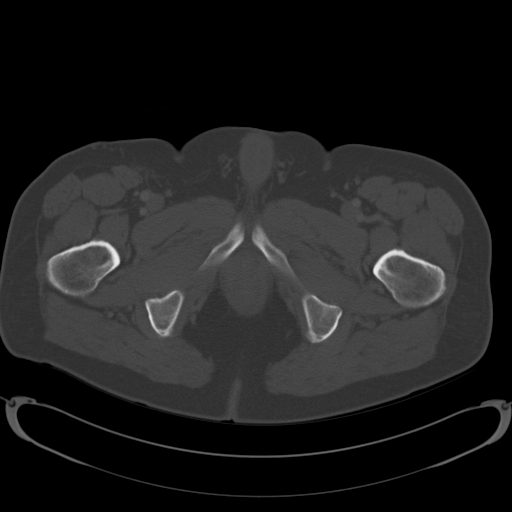
[im 16/136  soft-tissue]
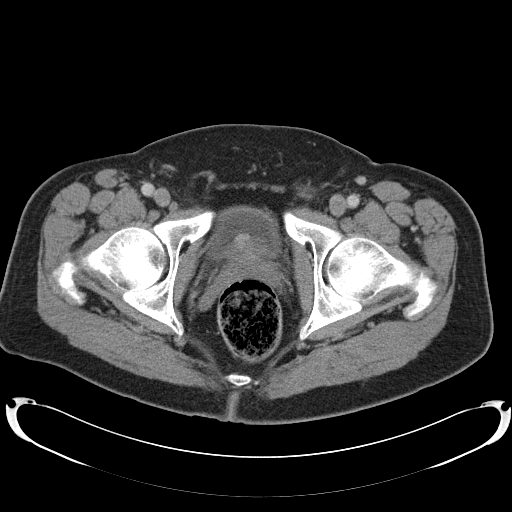
[im 31/136  soft-tissue]
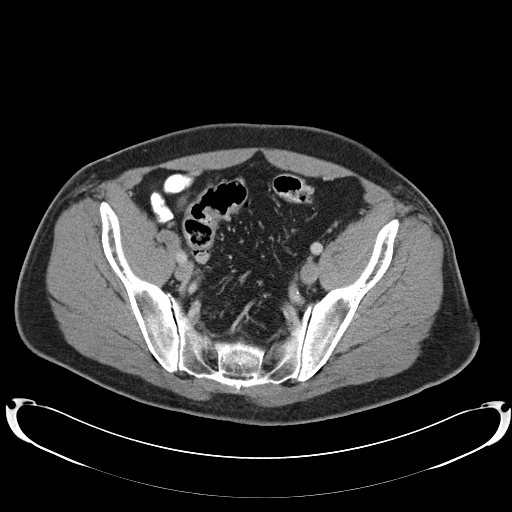
[im 38/136  soft-tissue]
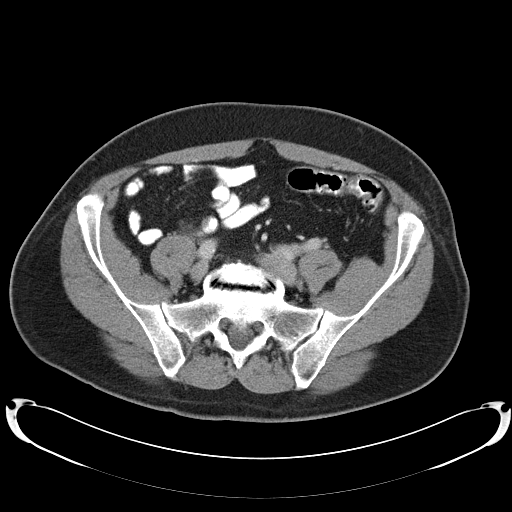
[im 46/136  soft-tissue]
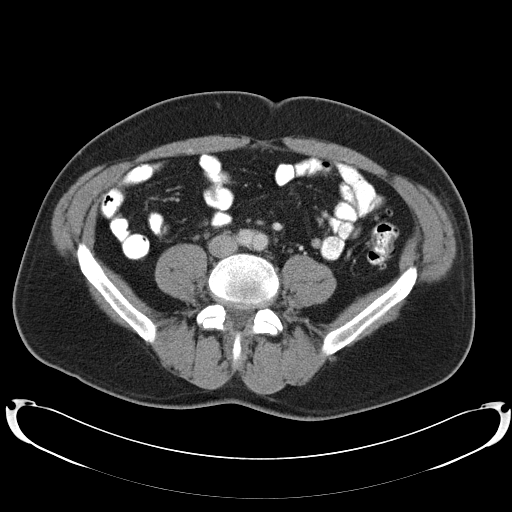
[im 61/136  soft-tissue]
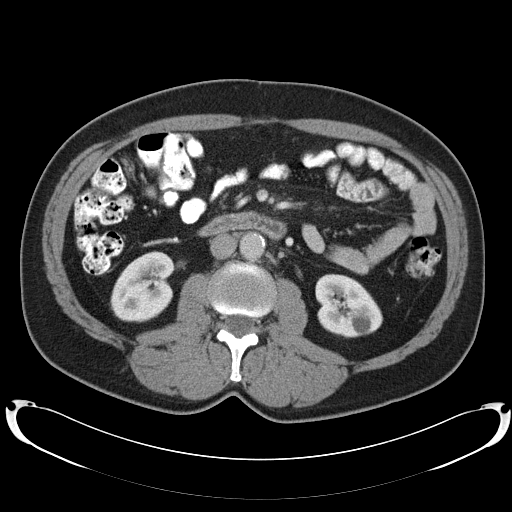
[im 68/136  soft-tissue]
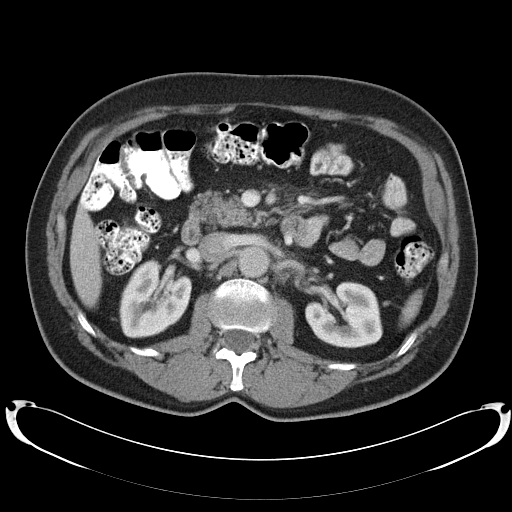
[im 76/136  soft-tissue]
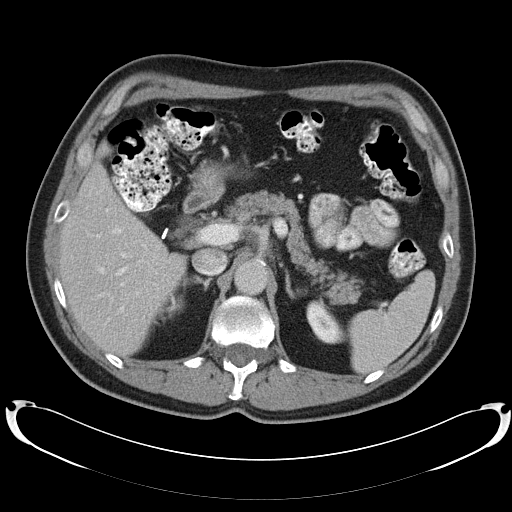
[im 91/136  soft-tissue]
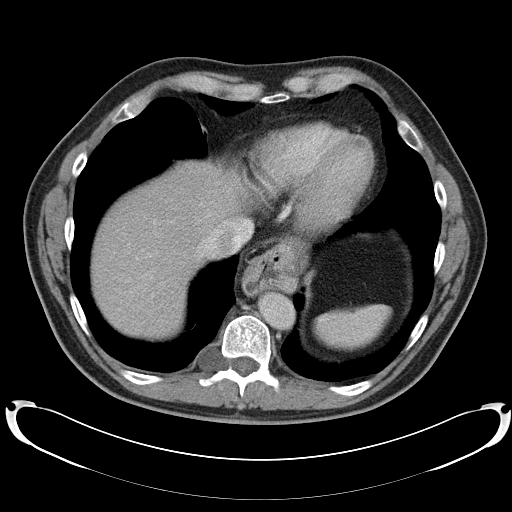
[im 91/136  bone]
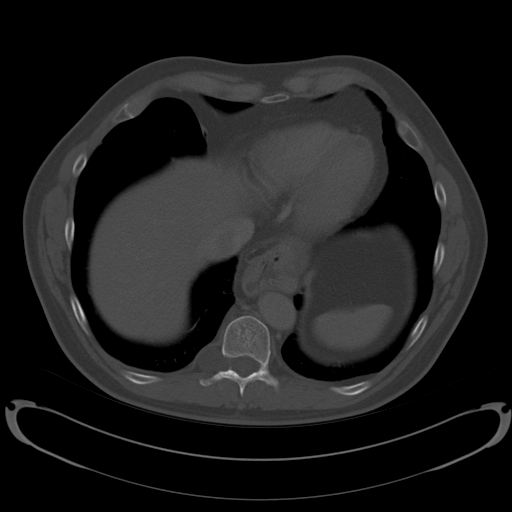
[im 98/136  soft-tissue]
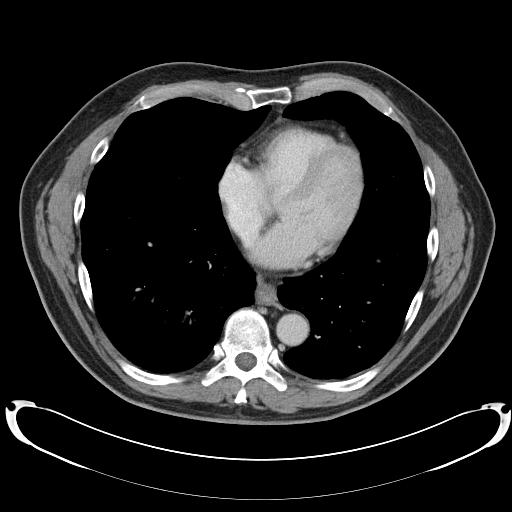
[im 106/136  soft-tissue]
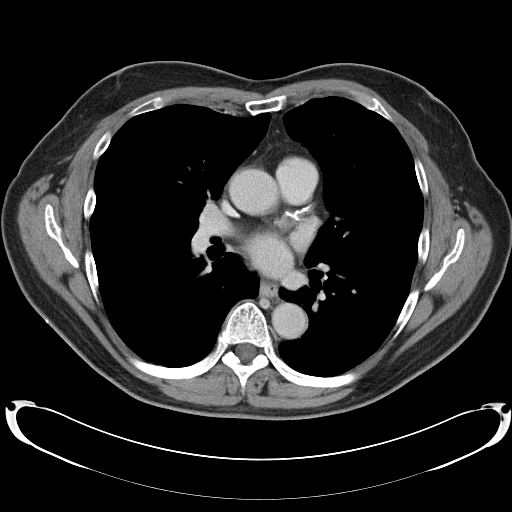
[im 121/136  soft-tissue]
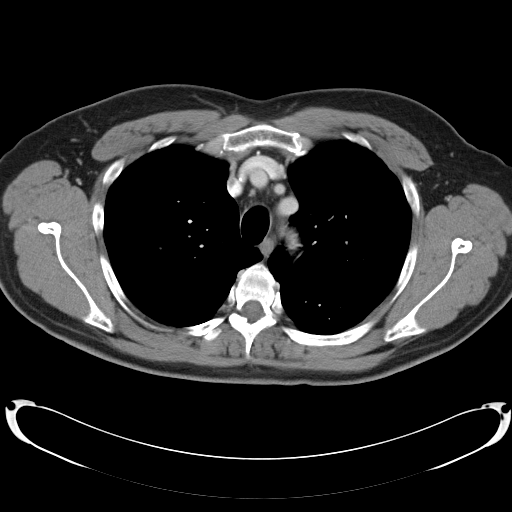
[im 128/136  soft-tissue]
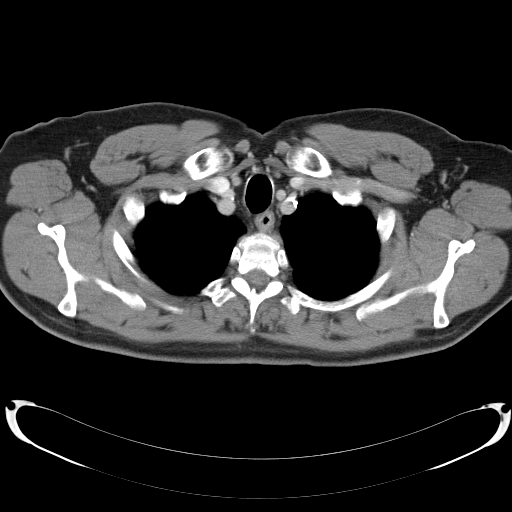

[Series 603: <mpr thick range> · coronal · 1.33mm/px · 3 of 91 slices shown]
[im 31/91  soft-tissue]
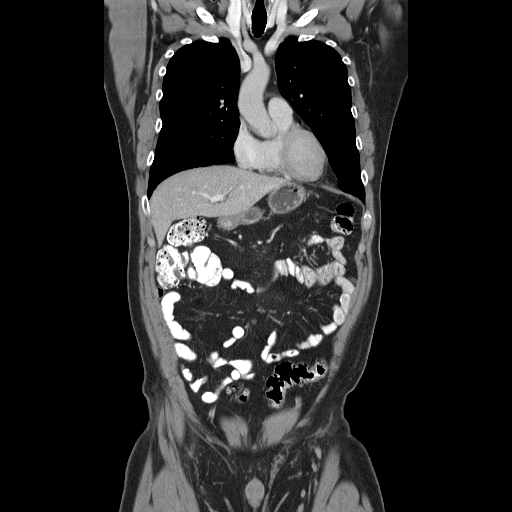
[im 41/91  soft-tissue]
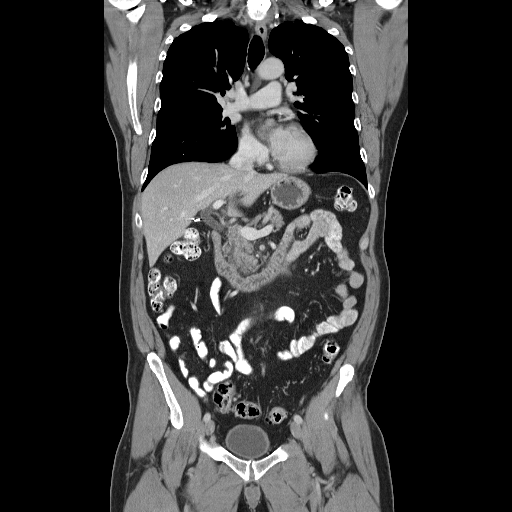
[im 51/91  soft-tissue]
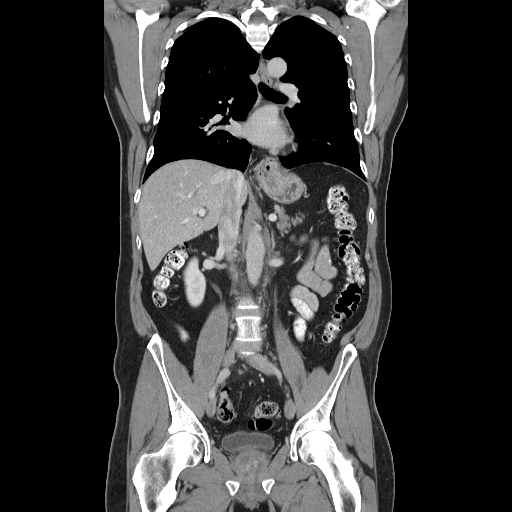

[16 of 46 positions shown; findings below may reference images not displayed]

FINDINGS: No mediastinal, or hilar adenopathy.

Negative for axillary, or supraclavicular adenopathy.

Negative for pericardial or pleural effusion.

Subpleural density in the right lower lobe measures 10.4 mm, new from prior
exam.

Stable nodule in the left upper lobe, image 29, measuring 0.4 cm.

Review of bone windows is unremarkable.

IMPRESSION

1. The only new finding is a of subpleural density within the right lower lobe
measuring 10 mm. The appearance is nonspecific but likely represents the sequela
of infection or inflammation. A pulmonary nodule is consider less favored.
Followup examination 3 to 6 months recommended to ensure resolution.

2. No evidence for pathologic lymphadenopathy in the chest.

ABDOMEN CT WITH CONTRAST
FINDINGS: 2 foci of low density within the left lobe of liver are again noted,
images 51 and 49. These are stable compared to prior exam and likely represent
simple cysts.

Patient is status post cholecystectomy. There is mild intra and extrahepatic
biliary ductal dilatation the appearance is similar to previous exam.

Spleen is negative.

Right kidney, negative. 

Left kidney, has a cyst arising from the lower pole, stable from prior exam. 

Both adrenal glands are normal. 

The pancreatic parenchyma is normal.

Retroperitoneal soft tissue which extends into the root of the mesentery is
again noted. This surrounds the celiac trunk. When compared to the prior exam
the appearance is unchanged. For comparison purposes periaortic lymph node
posterior to the left renal vein measures 0.9 x 1.1 cm, image 70. Previously
measured the same. 

No new or enlarging lymph nodes.

Hiatal hernia, stable.

Review of bone windows shows no lytic or sclerotic lesions.

Lower thoracic meningoceles are stable,  image 54.

IMPRESSION

1. Stable appearance of the upper abdomen. No change in  soft tissue stranding
within retroperitoneal and mesenteric spaces.
2. Hiatal hernia

PELVIS CT WITH CONTRAST
FINDINGS: Pelvic bowel loops are negative. Urinary bladder is negative.
Negative for pelvic lymphadenopathy.

Negative for free fluid or abnormal fluid collections.

Negative for pelvic mass.

Review of bone windows shows no lytic or sclerotic lesions.

IMPRESSION

1. No evidence for pathologic lymphadenopathy.

## 2006-09-30 ENCOUNTER — Emergency Department (HOSPITAL_COMMUNITY): Admission: EM | Admit: 2006-09-30 | Discharge: 2006-09-30 | Payer: Self-pay | Admitting: Family Medicine

## 2006-10-14 IMAGING — CR DG CHEST 1V PORT
1 series · 1 of 1 positions shown · non-contrast
Comparison: Pre-op chest x-ray [DATE].

CLINICAL DATA: Post VATS for lung lesion.  Central line placement. 
 PORTABLE CHEST - 1 VIEW - [DATE]

[AP]
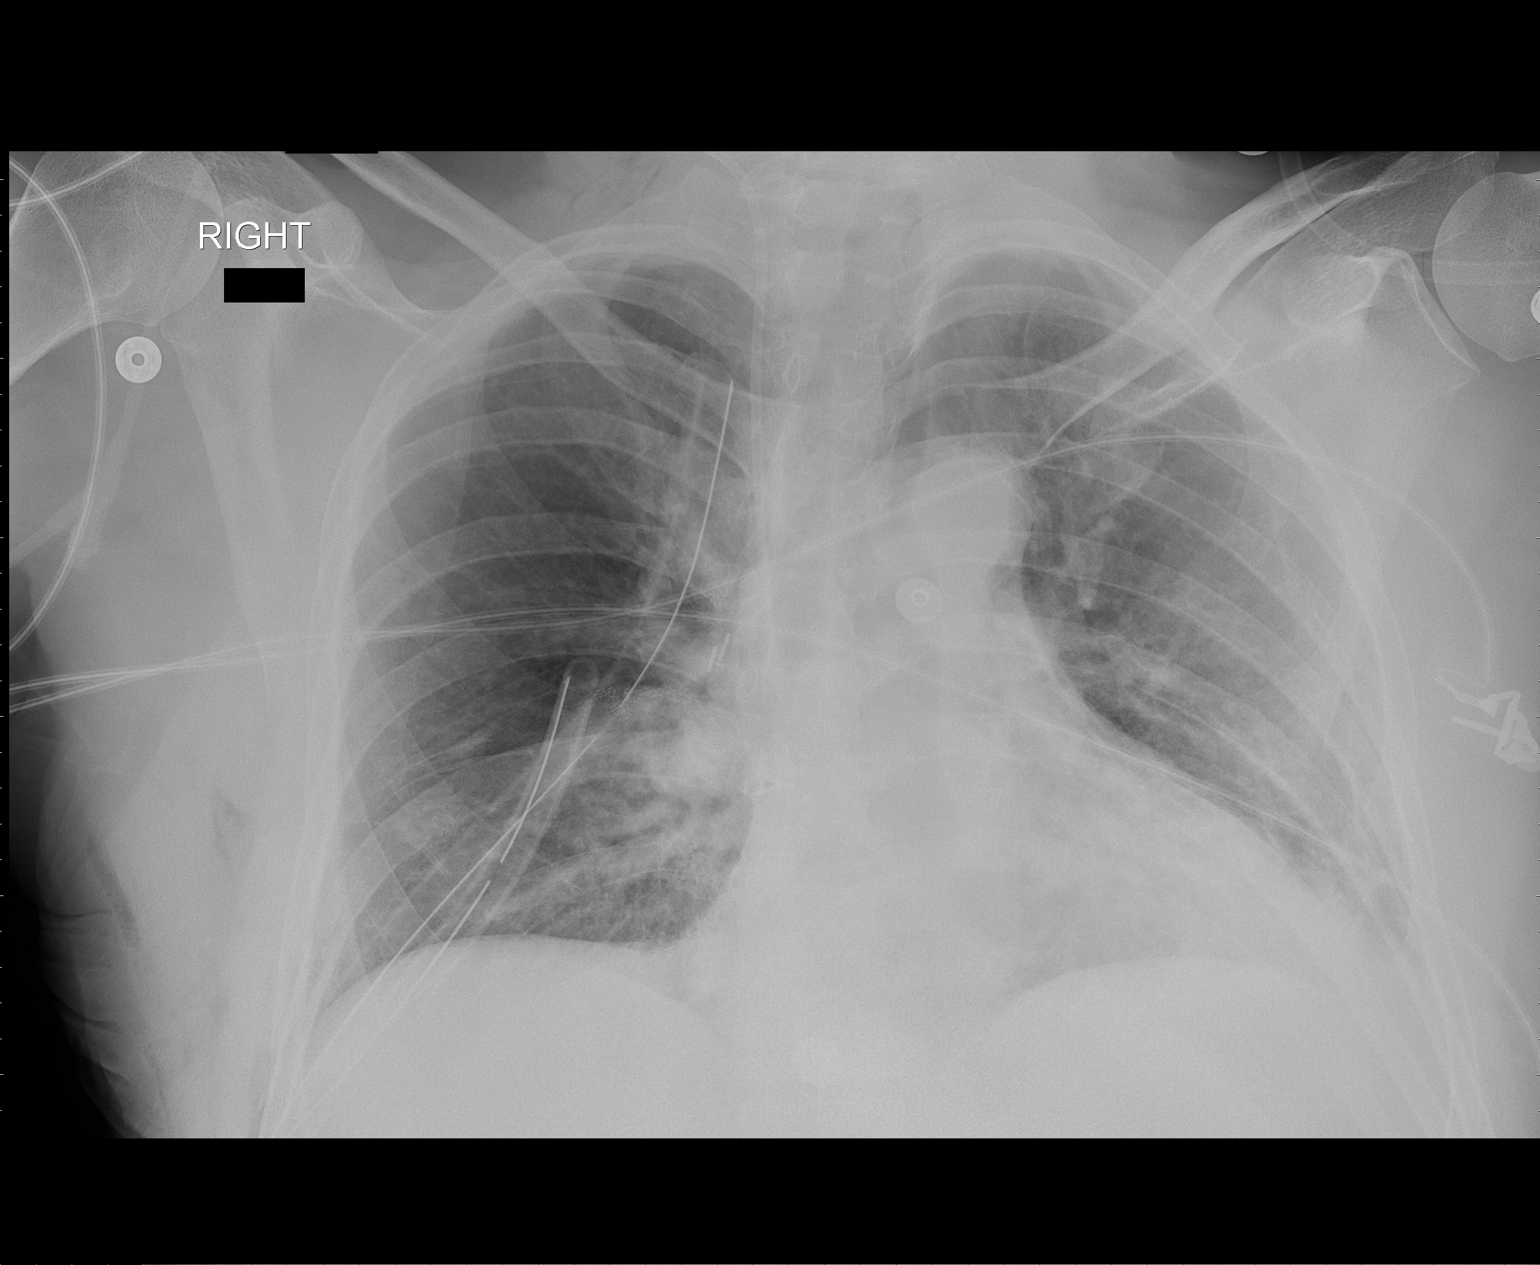

[1 of 1 positions shown; findings below may reference images not displayed]

FINDINGS: Status post right VATS/thoracotomy.  Two right chest tubes are in place with no pneumothorax.  There is a small amount of subcutaneous air.  
 A right jugular central line is in place with its tip in the SVC.  There is mild bibasilar volume loss.
IMPRESSION: Satisfactory postoperative chest x-ray following VATS ? see above.

## 2006-11-17 ENCOUNTER — Ambulatory Visit: Payer: Self-pay | Admitting: Oncology

## 2006-11-21 ENCOUNTER — Ambulatory Visit (HOSPITAL_COMMUNITY): Admission: RE | Admit: 2006-11-21 | Discharge: 2006-11-21 | Payer: Self-pay | Admitting: Oncology

## 2006-11-21 LAB — COMPREHENSIVE METABOLIC PANEL
Alkaline Phosphatase: 55 U/L (ref 39–117)
BUN: 15 mg/dL (ref 6–23)
CO2: 31 mEq/L (ref 19–32)
Creatinine, Ser: 1.05 mg/dL (ref 0.40–1.50)
Glucose, Bld: 112 mg/dL — ABNORMAL HIGH (ref 70–99)
Total Bilirubin: 0.9 mg/dL (ref 0.3–1.2)
Total Protein: 7 g/dL (ref 6.0–8.3)

## 2006-11-21 LAB — CBC WITH DIFFERENTIAL/PLATELET
Basophils Absolute: 0 10*3/uL (ref 0.0–0.1)
EOS%: 2 % (ref 0.0–7.0)
Eosinophils Absolute: 0.1 10*3/uL (ref 0.0–0.5)
HGB: 17.6 g/dL — ABNORMAL HIGH (ref 13.0–17.1)
LYMPH%: 23.9 % (ref 14.0–48.0)
MCH: 31.2 pg (ref 28.0–33.4)
MCV: 88.7 fL (ref 81.6–98.0)
MONO%: 7.9 % (ref 0.0–13.0)
NEUT#: 4.2 10*3/uL (ref 1.5–6.5)
NEUT%: 65.5 % (ref 40.0–75.0)
Platelets: 170 10*3/uL (ref 145–400)
RDW: 13 % (ref 11.2–14.6)

## 2006-11-21 LAB — LACTATE DEHYDROGENASE: LDH: 123 U/L (ref 94–250)

## 2006-11-21 IMAGING — CT CT PELVIS W/ CM
2 of 5 series · 16 of 46 positions shown, 18 images · IV contrast (omnipaque)
Comparison: none

CLINICAL DATA: Follow-up lymphoma.  
CHEST CT WITH CONTRAST:
TECHNIQUE: Multidetector CT imaging of the chest was performed following the standard protocol during bolus administration of intravenous contrast.
Contrast:  [OX] Omnipaque 300.
TECHNIQUE: Multidetector CT imaging of the abdomen was performed following the standard protocol during bolus administration of intravenous contrast.
TECHNIQUE: Multidetector CT imaging of the pelvis was performed following the standard protocol during bolus administration of intravenous contrast.

[Series 2: cap 5.0 b40f · axial · 0.82mm/px · z∈[-681,-66]mm · 13 of 139 slices shown, 15 images]
[im 8/139  soft-tissue]
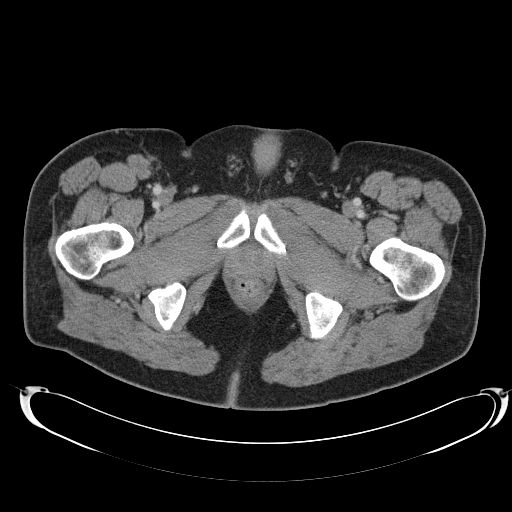
[im 8/139  bone]
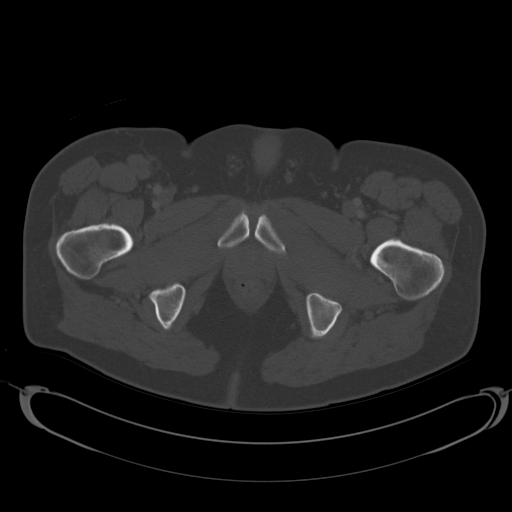
[im 22/139  soft-tissue]
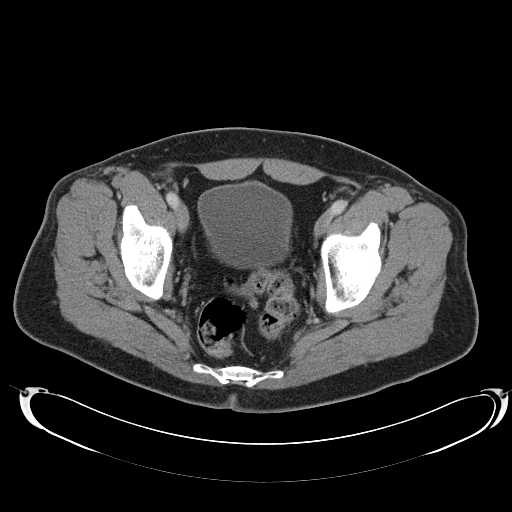
[im 30/139  soft-tissue]
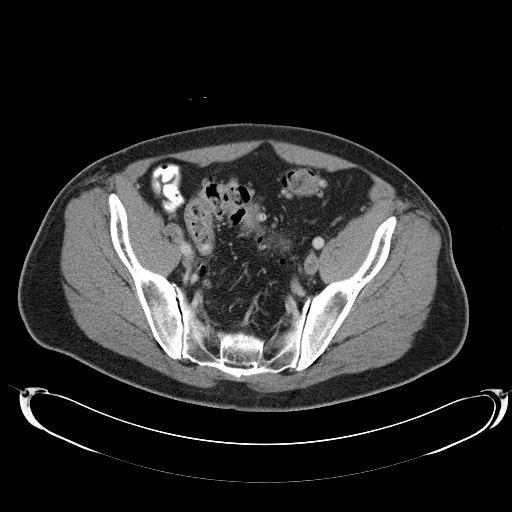
[im 37/139  soft-tissue]
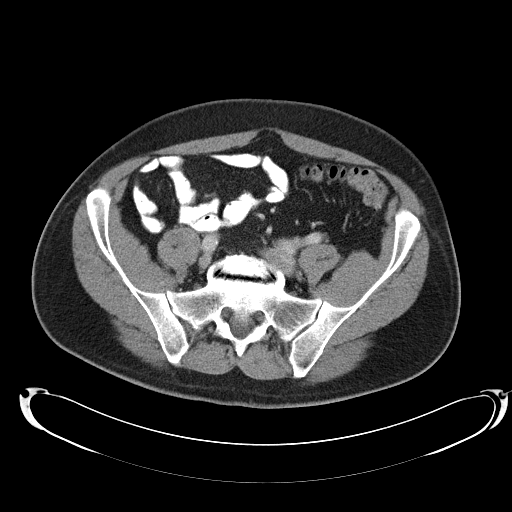
[im 51/139  soft-tissue]
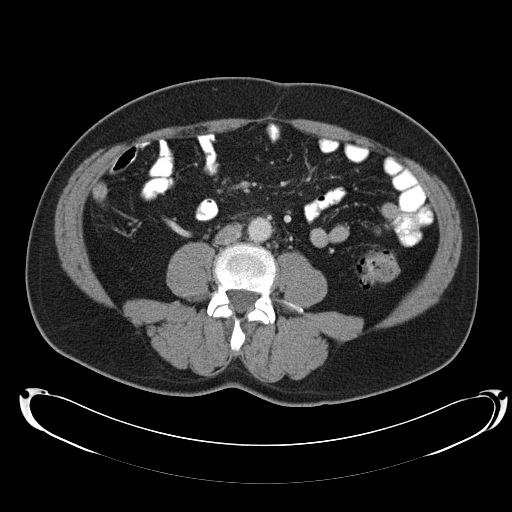
[im 59/139  soft-tissue]
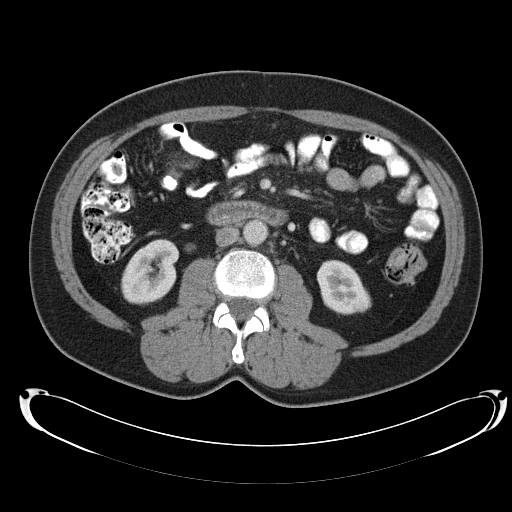
[im 73/139  soft-tissue]
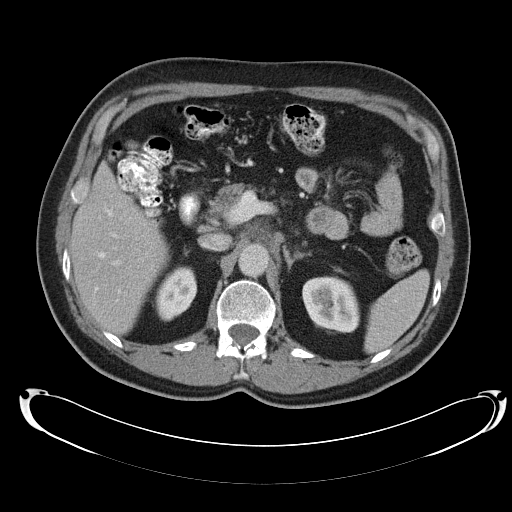
[im 80/139  soft-tissue]
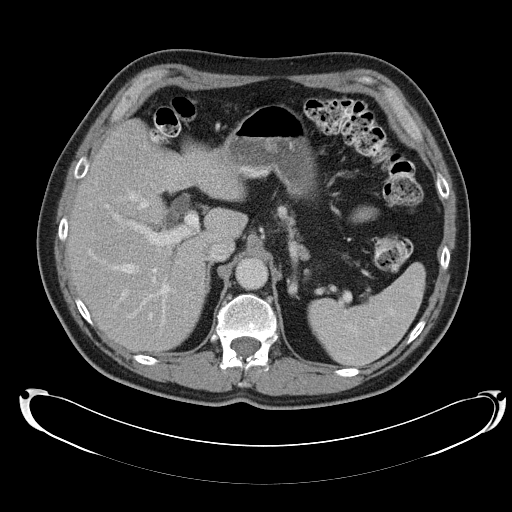
[im 88/139  soft-tissue]
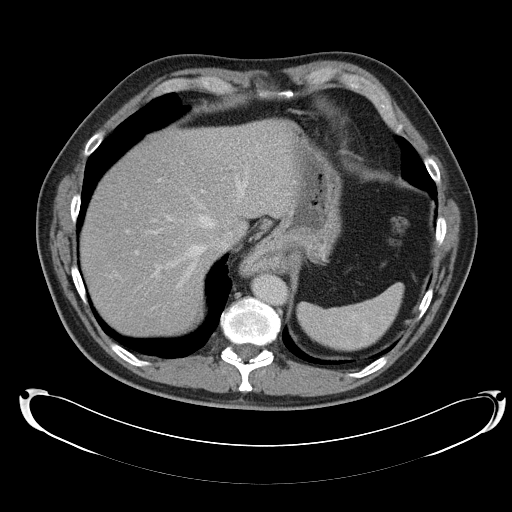
[im 88/139  bone]
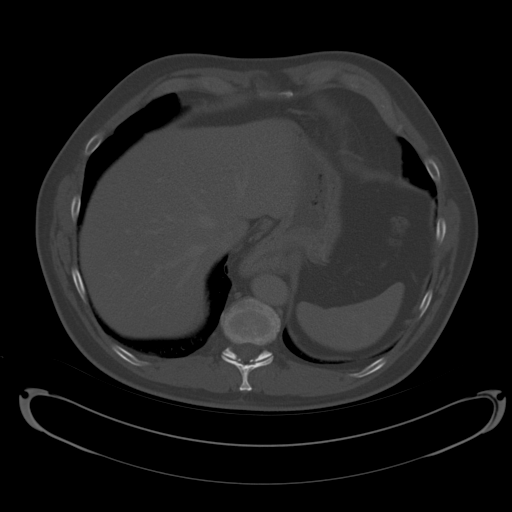
[im 102/139  soft-tissue]
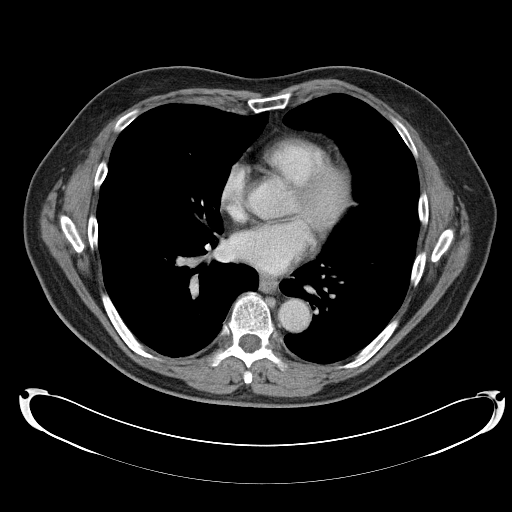
[im 109/139  soft-tissue]
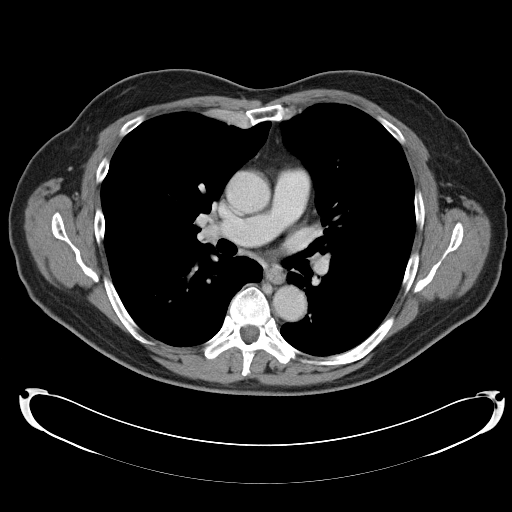
[im 117/139  soft-tissue]
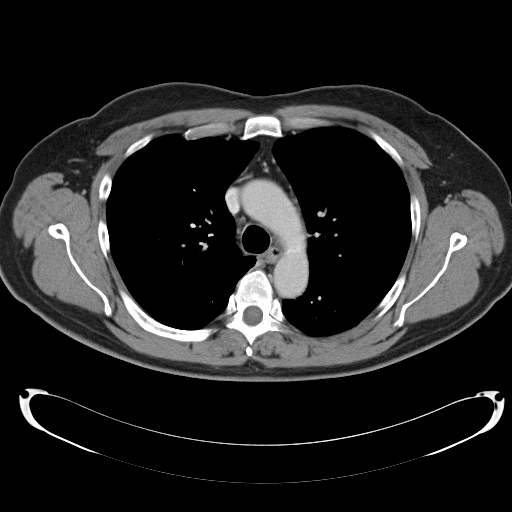
[im 131/139  soft-tissue]
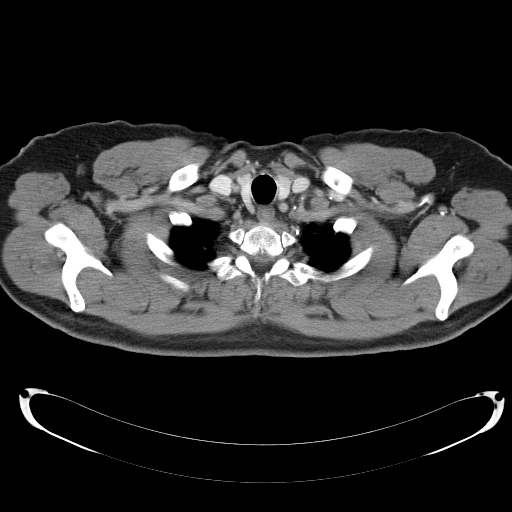

[Series 602: coronal images · coronal · 1.36mm/px · 3 of 84 slices shown]
[im 28/84  soft-tissue]
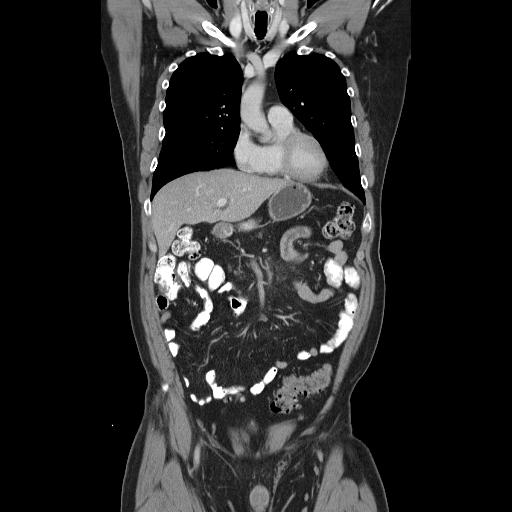
[im 37/84  soft-tissue]
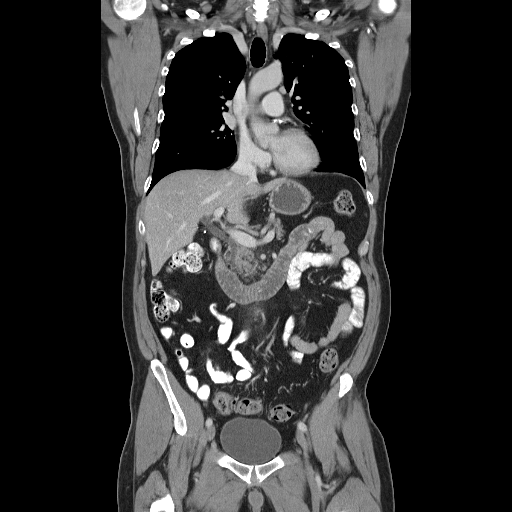
[im 47/84  soft-tissue]
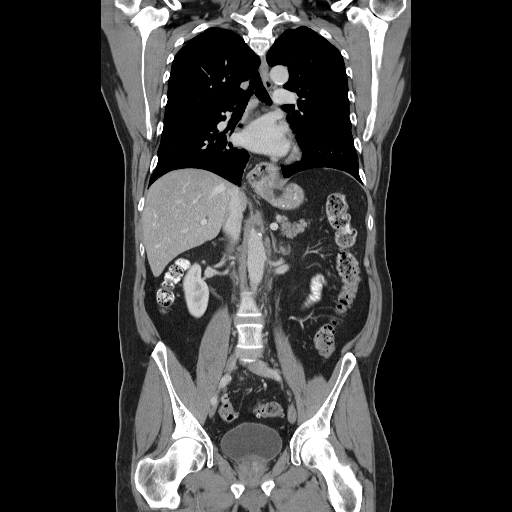

[16 of 46 positions shown; findings below may reference images not displayed]

FINDINGS: There are no enlarged axillary or supraclavicular lymph nodes.  
There is no enlarged mediastinal or hilar lymph nodes.  
No pericardial or pleural fluid.  There is a large hiatal hernia.  
There is a subpleural, nodular density within the superior segment of the right lower lobe which measures 11.5 x 15.6 mm, image 33.  On the previous exam, this measured 10.4 x 7.1 mm.  
The remaining portions of the lung parenchyma are clear.  
Review of the bone windows shows no suspicious lytic or sclerotic lesions.
IMPRESSION: 1.  The previously described pulmonary nodule in the superior segment of the right lower lobe has persisted and increased in size.  This is a worrisome feature and a PET CT is recommended to assess for hypermetabolism and to determine the likelihood of malignancy.  
2.  No evidence for pathologic lymphadenopathy.  
ABDOMEN CT WITH CONTRAST:
FINDINGS: The patient is status-post cholecystectomy. 
Low-density lesions within the liver parenchyma remain too small to characterize but are stable when compared with the prior exam.  
The spleen is normal.  
Both adrenal glands are normal.  
The right kidney is normal.  There is a cyst within the lower pole of the left kidney which is stable.  
The pancreas is normal.  
Hazy soft tissue strandiness surrounding the retroperitoneal structures is again noted.  When compared with the previous, the appearance is similar. There is a lymph node adjacent to the celiac trunk which measures 1.6 x 1.1 cm, image 68.  This is stable when compared with the prior exam.  No new, or enlarging retroperitoneal or small bowel mesenteric lymph nodes.  
Colonic diverticula without active inflammation.  The bowel loops are non-obstructed.  
There is no free fluid or abnormal fluid collections.  
Review of the bone windows is unremarkable with the exception of mild lumbar spondylosis.
IMPRESSION: 1.  Stable appearance of the upper abdomen.  No change in soft tissue stranding within the retroperitoneal and mesenteric space. 
2.  Low density lesions in liver and right kidney are stable.  
PELVIS CT WITH CONTRAST:
FINDINGS: No free fluid. 
The urinary bladder is negative. 
There is colonic diverticula without active inflammation.  
Negative for pathologic adenopathy.  
There is no pelvic mass.  
Review of the bone windows is unremarkable.
IMPRESSION: 1.  No acute pelvic CT findings. 
2.  No mass or adenopathy.

## 2006-12-01 ENCOUNTER — Ambulatory Visit (HOSPITAL_COMMUNITY): Admission: RE | Admit: 2006-12-01 | Discharge: 2006-12-01 | Payer: Self-pay | Admitting: Oncology

## 2006-12-01 IMAGING — PT NM PET TUM IMG SKULL BASE T - THIGH
6 series · 25 of 25 positions shown · non-contrast
Comparison: [DATE].

CLINICAL DATA: History of lymphoma . Enlarging right lower lobe lung mass.
TECHNIQUE: 16.4 mCi F-18 FDG was injected intravenously via the right
antecubital fossa .  Full-ring PET imaging was performed from the skull base
through the mid-thighs 59 minutes after injection.  CT data was obtained and
used for attenuation correction and anatomic localization only.  (This was not
acquired as a diagnostic CT examination.)

[Series 1: pet ac · axial · 3.3mm · 4.69mm/px · z∈[-870,+0]mm · 5 of 267 slices shown]
[im 1/267]
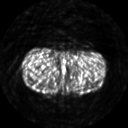
[im 67/267]
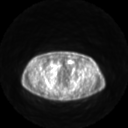
[im 134/267]
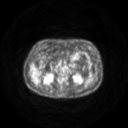
[im 200/267]
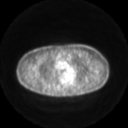
[im 267/267]
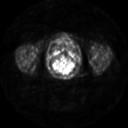

[Series 2: ct images · axial · 3.8mm · 0.98mm/px · z∈[-870,+0]mm · 5 of 259 slices shown]
[im 1/259]
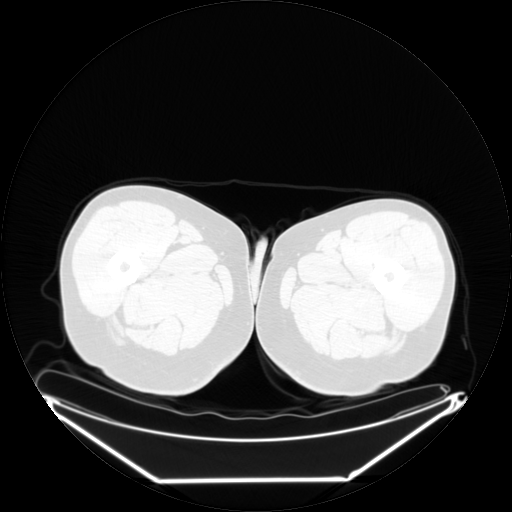
[im 65/259]
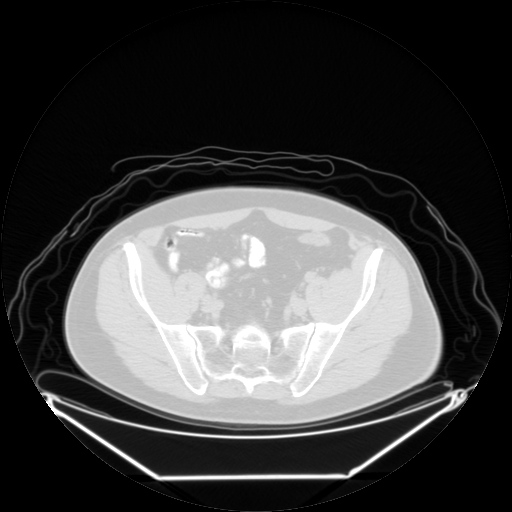
[im 130/259]
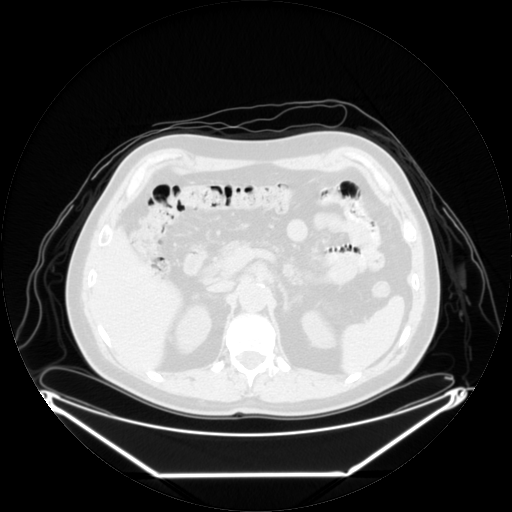
[im 194/259]
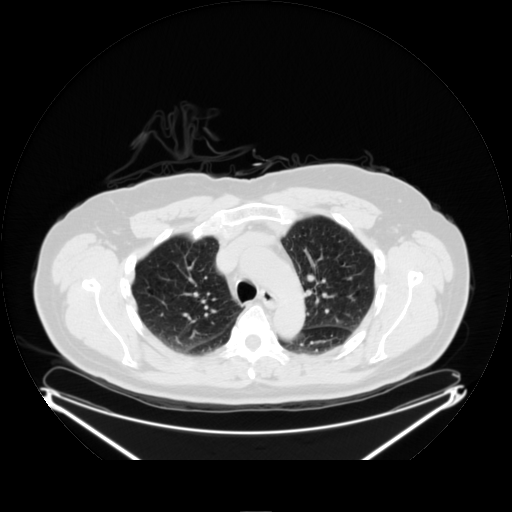
[im 259/259  brain]
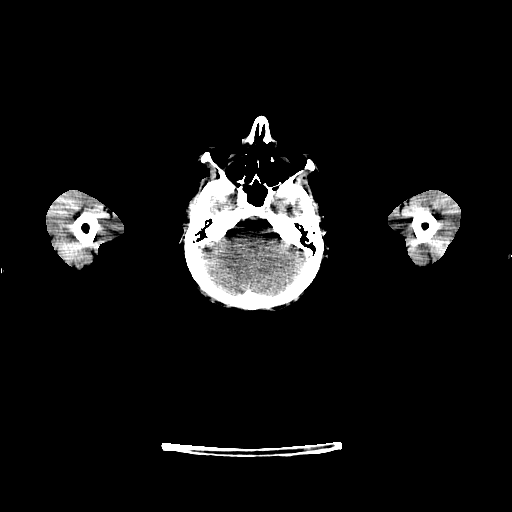

[Series 2: pet nac · axial · 3.3mm · 4.69mm/px · z∈[-870,+0]mm · 6 of 267 slices shown]
[im 1/267]
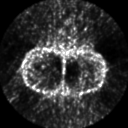
[im 54/267]
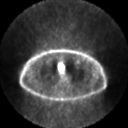
[im 107/267]
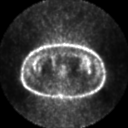
[im 160/267]
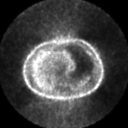
[im 213/267]
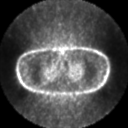
[im 267/267]
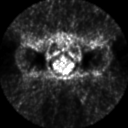

[Series 123: mip · coronal · 3.3mm · 4.69mm/px · 1 of 30 slices shown]
[im 1/30]
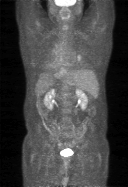

[Series 151: reformatted · axial · 3.3mm · 3.91mm/px · z∈[-870,+0]mm · 6 of 265 slices shown (1 of 2)]
[im 1/265]
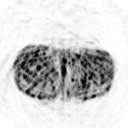
[im 53/265]
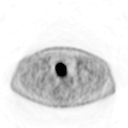
[im 106/265]
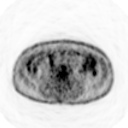
[im 159/265]
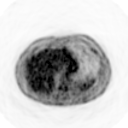
[im 212/265]
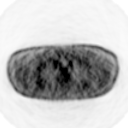
[im 265/265]
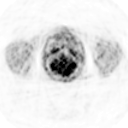

[Series 153: reformatted · coronal · 4.7mm · 6.98mm/px · 2 of 73 slices shown (2 of 2)]
[im 1/73]
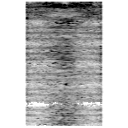
[im 73/73]
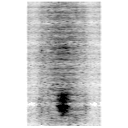

[25 of 25 positions shown; findings below may reference images not displayed]

CT scan of the chest from [DATE] has been compared
to [DATE] and [DATE].

FDG PET-CT TUMOR IMAGING (SKULL BASE TO THIGHS):
FINDINGS: The enlarging posterior subpleural right lower lobe pulmonary nodule
demonstrates hypermetabolic FDG uptake. The SUV max for this lesion is 2.7.
There is no evidence for hypermetabolic uptake in the right hilum or the
mediastinum.

No other areas of unexpected or abnormal FDG accumulation are identified in the
neck, abdomen, or pelvis. As described on the recent diagnostic CT scan, there
is persistent abnormal soft tissue attenuation in the retroperitoneal space,
between the celiac axis and superior mesenteric artery. As on the previous PET
CT, this shows only very low level FDG activity and is not hypermetabolic when
compared to background soft tissue structures.

Review of the CT data obtained for attenuation correction reveals no acute
findings in the chest, abdomen, or pelvis. No significant changes when comparing
to the diagnostic study performed 10 days ago. Please see the full report for
that previous exam for more detailed assessment.
IMPRESSION: Hypermetabolic activity in the right lower lobe subpleural pulmonary nodule has
an SUV max which is borderline between malignant and infectious/inflammatory
uptake. Given that this nodule was imperceptible a year ago and has progressed
on the 2 scans performed since that time, low grade or well-differentiated
neoplasm should be considered.

## 2006-12-06 ENCOUNTER — Ambulatory Visit: Payer: Self-pay | Admitting: Thoracic Surgery

## 2006-12-09 ENCOUNTER — Ambulatory Visit: Admission: RE | Admit: 2006-12-09 | Discharge: 2006-12-09 | Payer: Self-pay | Admitting: Thoracic Surgery

## 2006-12-19 ENCOUNTER — Ambulatory Visit: Payer: Self-pay | Admitting: Thoracic Surgery

## 2006-12-19 ENCOUNTER — Inpatient Hospital Stay (HOSPITAL_COMMUNITY): Admission: RE | Admit: 2006-12-19 | Discharge: 2006-12-22 | Payer: Self-pay | Admitting: Thoracic Surgery

## 2006-12-19 ENCOUNTER — Encounter: Payer: Self-pay | Admitting: Thoracic Surgery

## 2006-12-19 IMAGING — CR DG CHEST 2V
2 series · 2 of 2 positions shown · non-contrast
Comparison: No prior chest x-ray.  This exam is correlated with a CT scan of the chest dated [DATE].

CLINICAL DATA: Lung lesion.  Pre-op.
 CHEST - 2 VIEWS:

[view not recorded (1 of 2)]
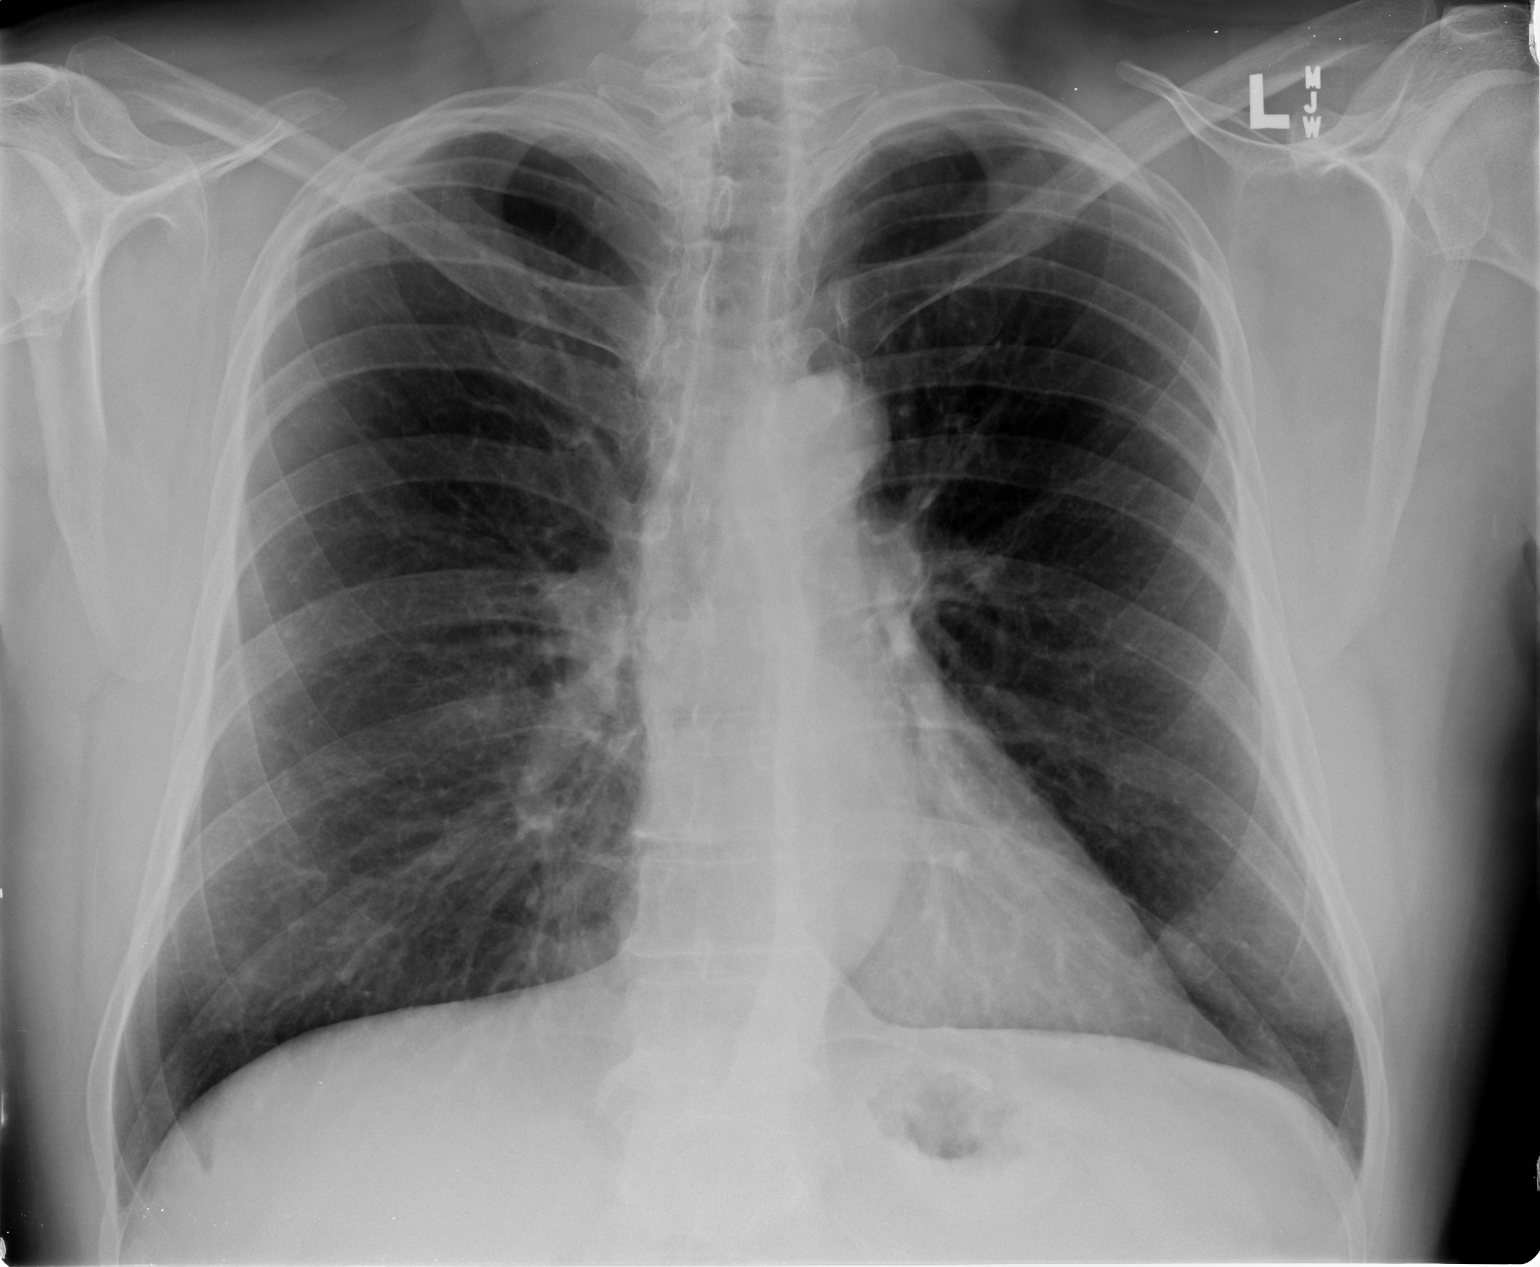

[view not recorded (2 of 2)]
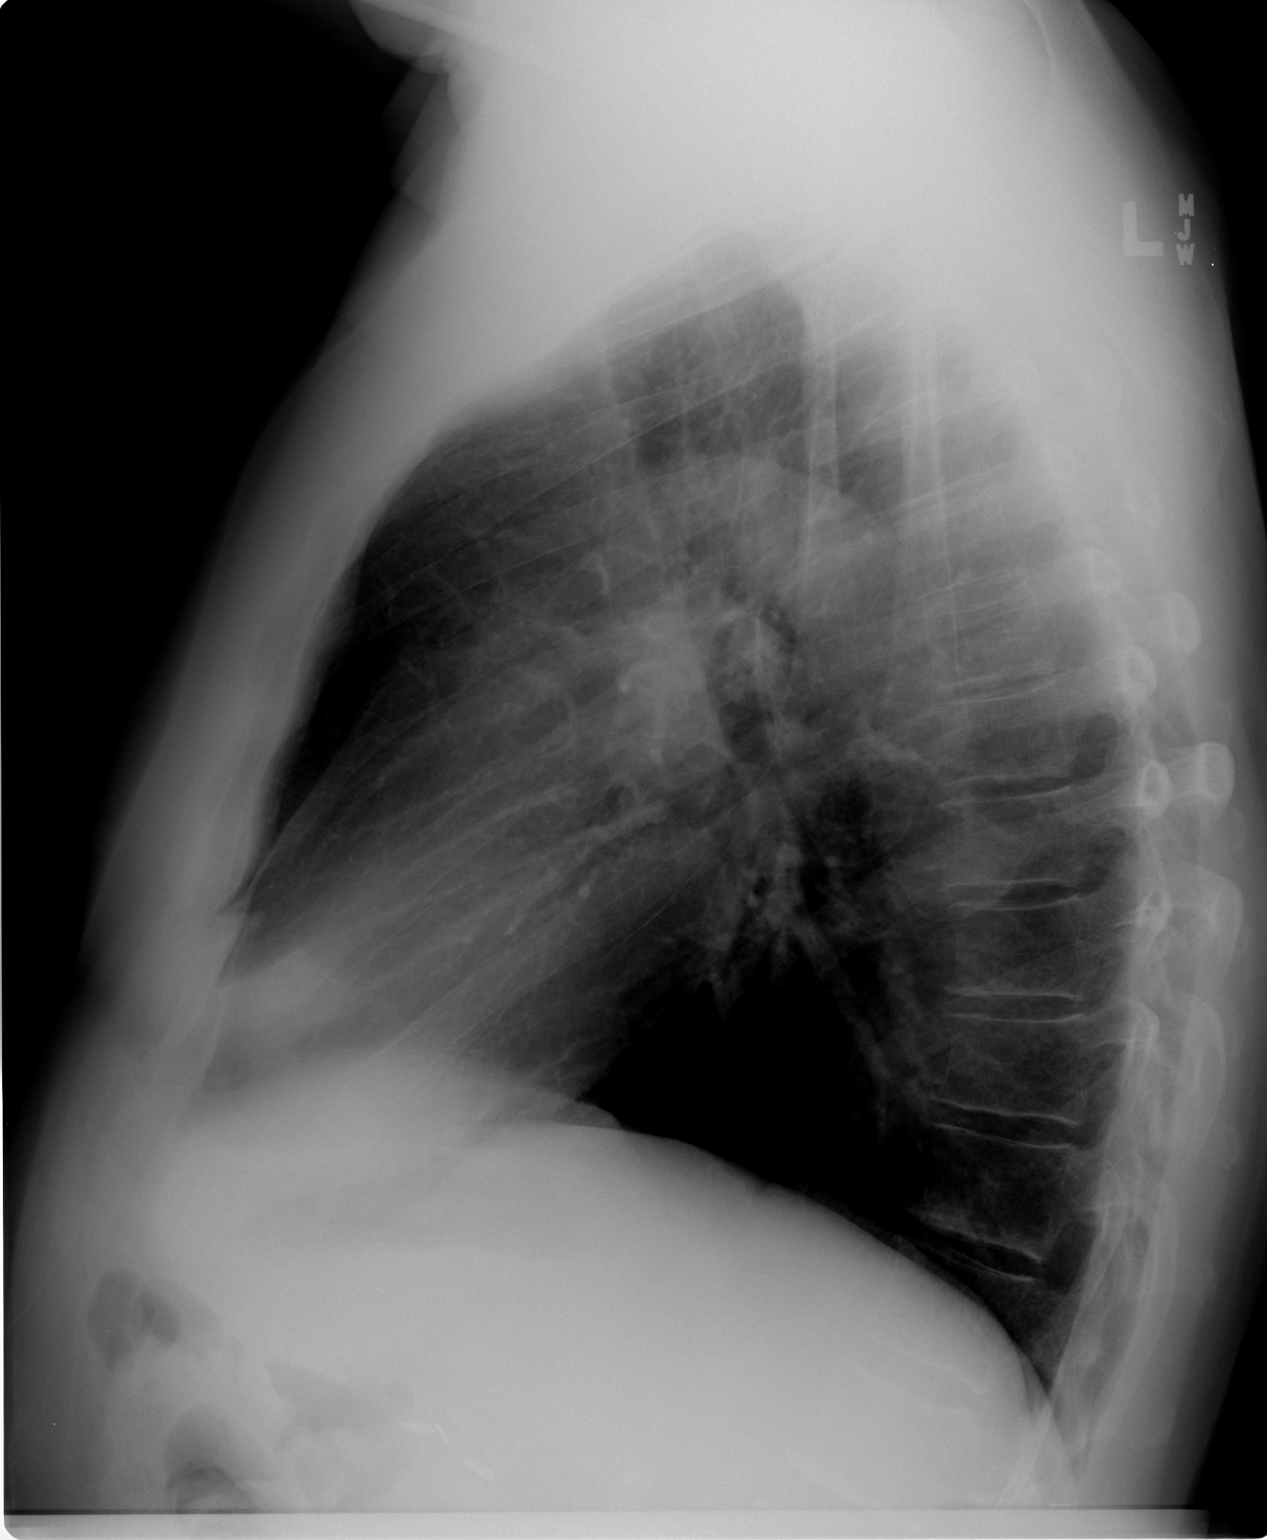

[2 of 2 positions shown; findings below may reference images not displayed]

FINDINGS: The heart is normal.  The aorta is mildly tortuous. The lungs are clear.  The right lower lobe nodule evident on CT is not visible with certainty on this chest x-ray.  Osseous structures are intact.
IMPRESSION: No active disease ? the known right lung lesion is not visible on this plain chest x-ray.

## 2006-12-20 IMAGING — CR DG CHEST 1V PORT
1 series · 1 of 1 positions shown · non-contrast
Comparison: One day prior

CLINICAL DATA: Status post VATS

CHEST - 1 VIEW

[view not recorded]
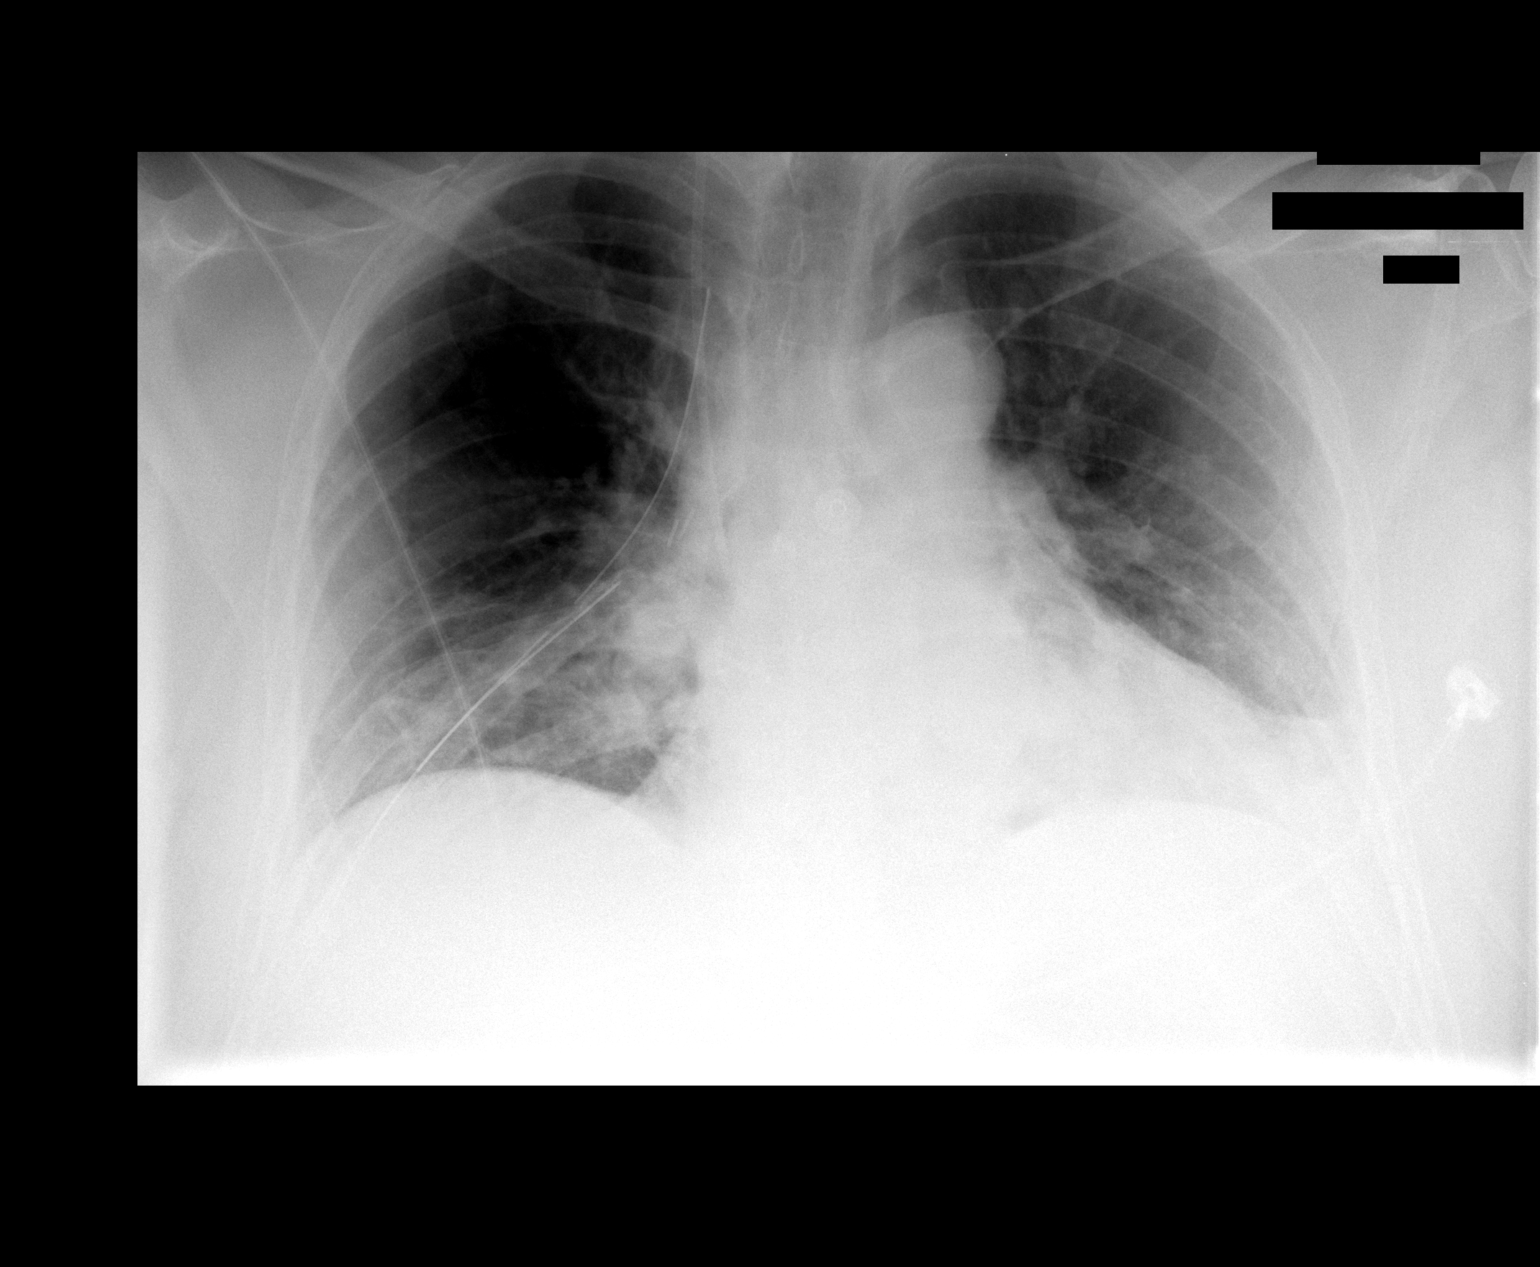

[1 of 1 positions shown; findings below may reference images not displayed]

FINDINGS: Two right-sided chest tubes are unchanged position. Right-sided IJ
line also unchanged. No pneumothorax. Normal heart size. Probable small left
pleural effusion. Decreased lung volumes. Increased bibasilar atelectasis.

IMPRESSION

1. Right-sided chest tubes remain in place without pneumothorax. 
2. decreased lung volumes and increased bibasilar atelectasis.

## 2006-12-21 IMAGING — CR DG CHEST 1V PORT
1 series · 1 of 1 positions shown · non-contrast
Comparison: [DATE] and earlier.

CLINICAL DATA: 61-year-old male with lung lesion; status-post VATS.  
 PORTABLE CHEST ? 1 VIEW ([DATE] HOURS):

[view not recorded]
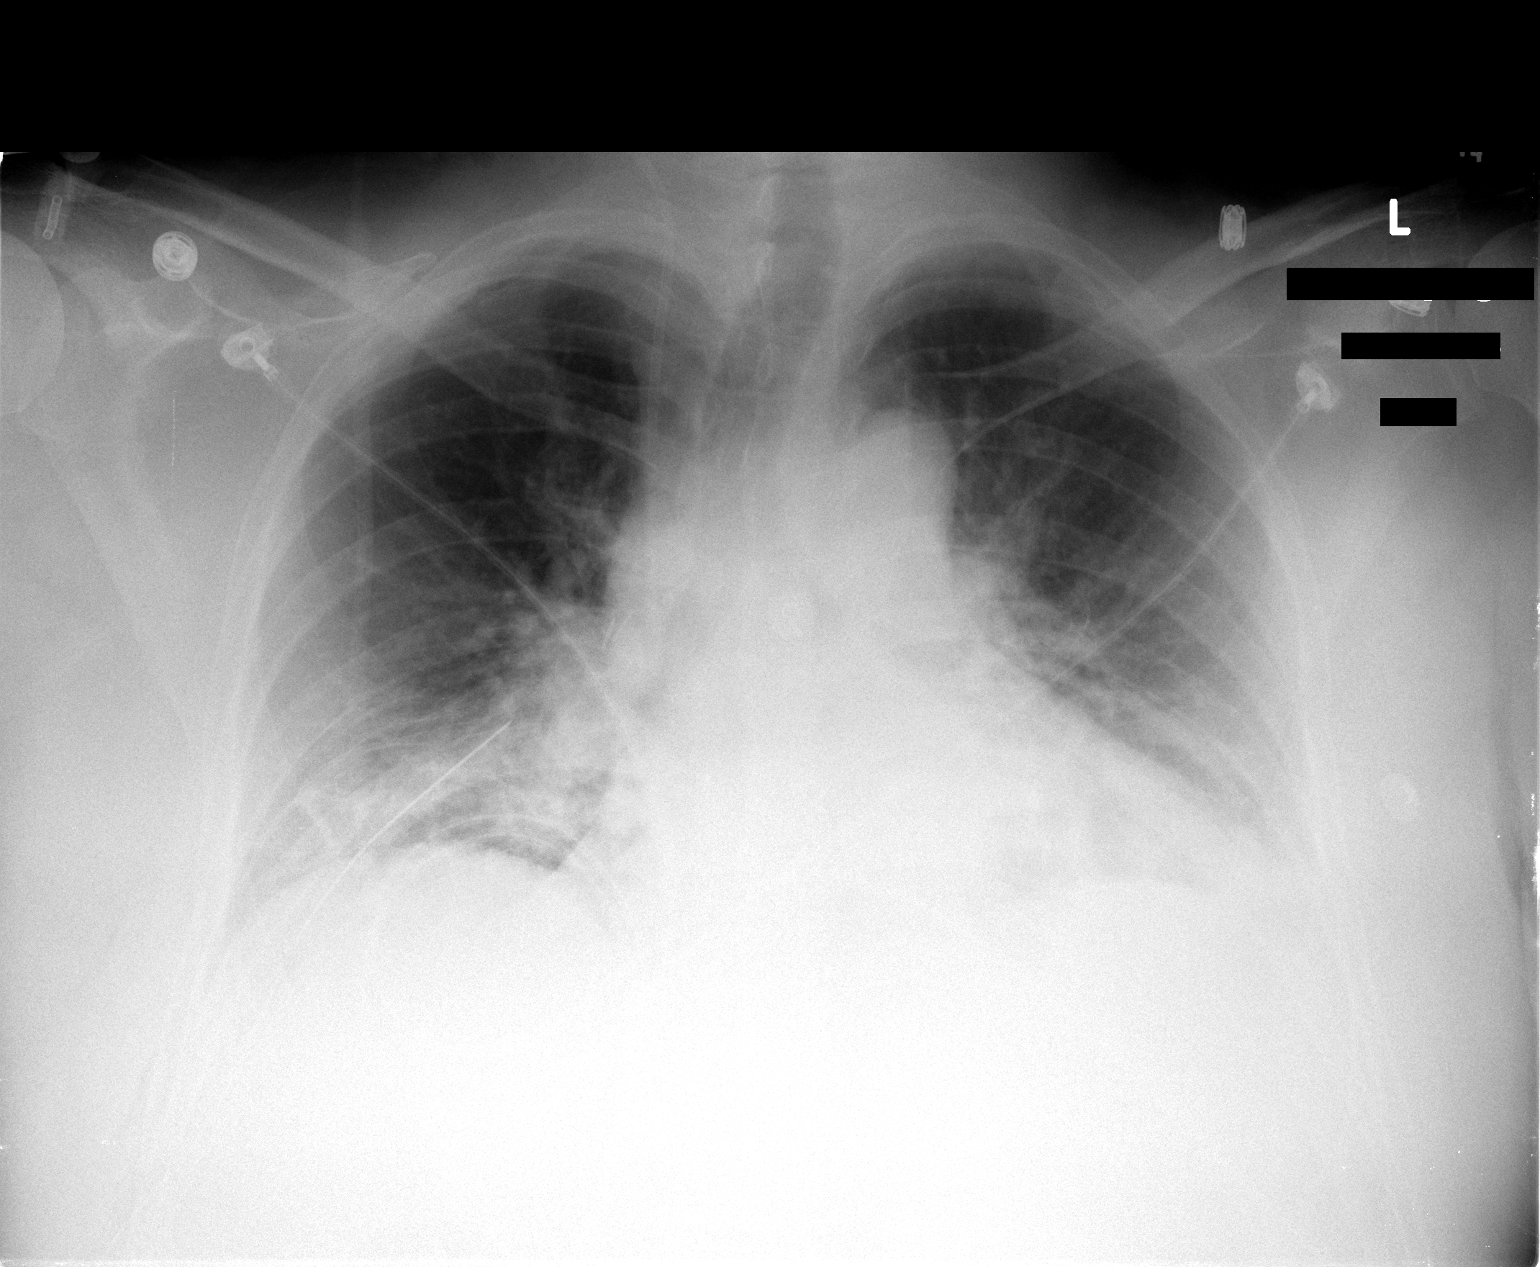

[1 of 1 positions shown; findings below may reference images not displayed]

FINDINGS: Stable right internal jugular central venous catheter.  One of the two right thoracotomy tubes has been removed.  A small right apical pneumothorax is suspected.  One right thoracostomy tube remains.  Surgical clips at the right hilum.  Very shallow lung inflation with ongoing basilar atelectasis.  No definite effusion or pulmonary edema.
IMPRESSION: 1.  One of the two right chest tubes removed, small apical pneumothorax suspected.  
 2.  Ongoing very shallow lung inflation with basilar atelectasis.

## 2006-12-21 IMAGING — CR DG CHEST 1V PORT
1 series · 1 of 1 positions shown · non-contrast
Comparison: Same day.

CLINICAL DATA: Lung lesion.  Chest tube removal.
 PORTABLE CHEST ? 1 VIEW ? [DATE]:

[view not recorded]
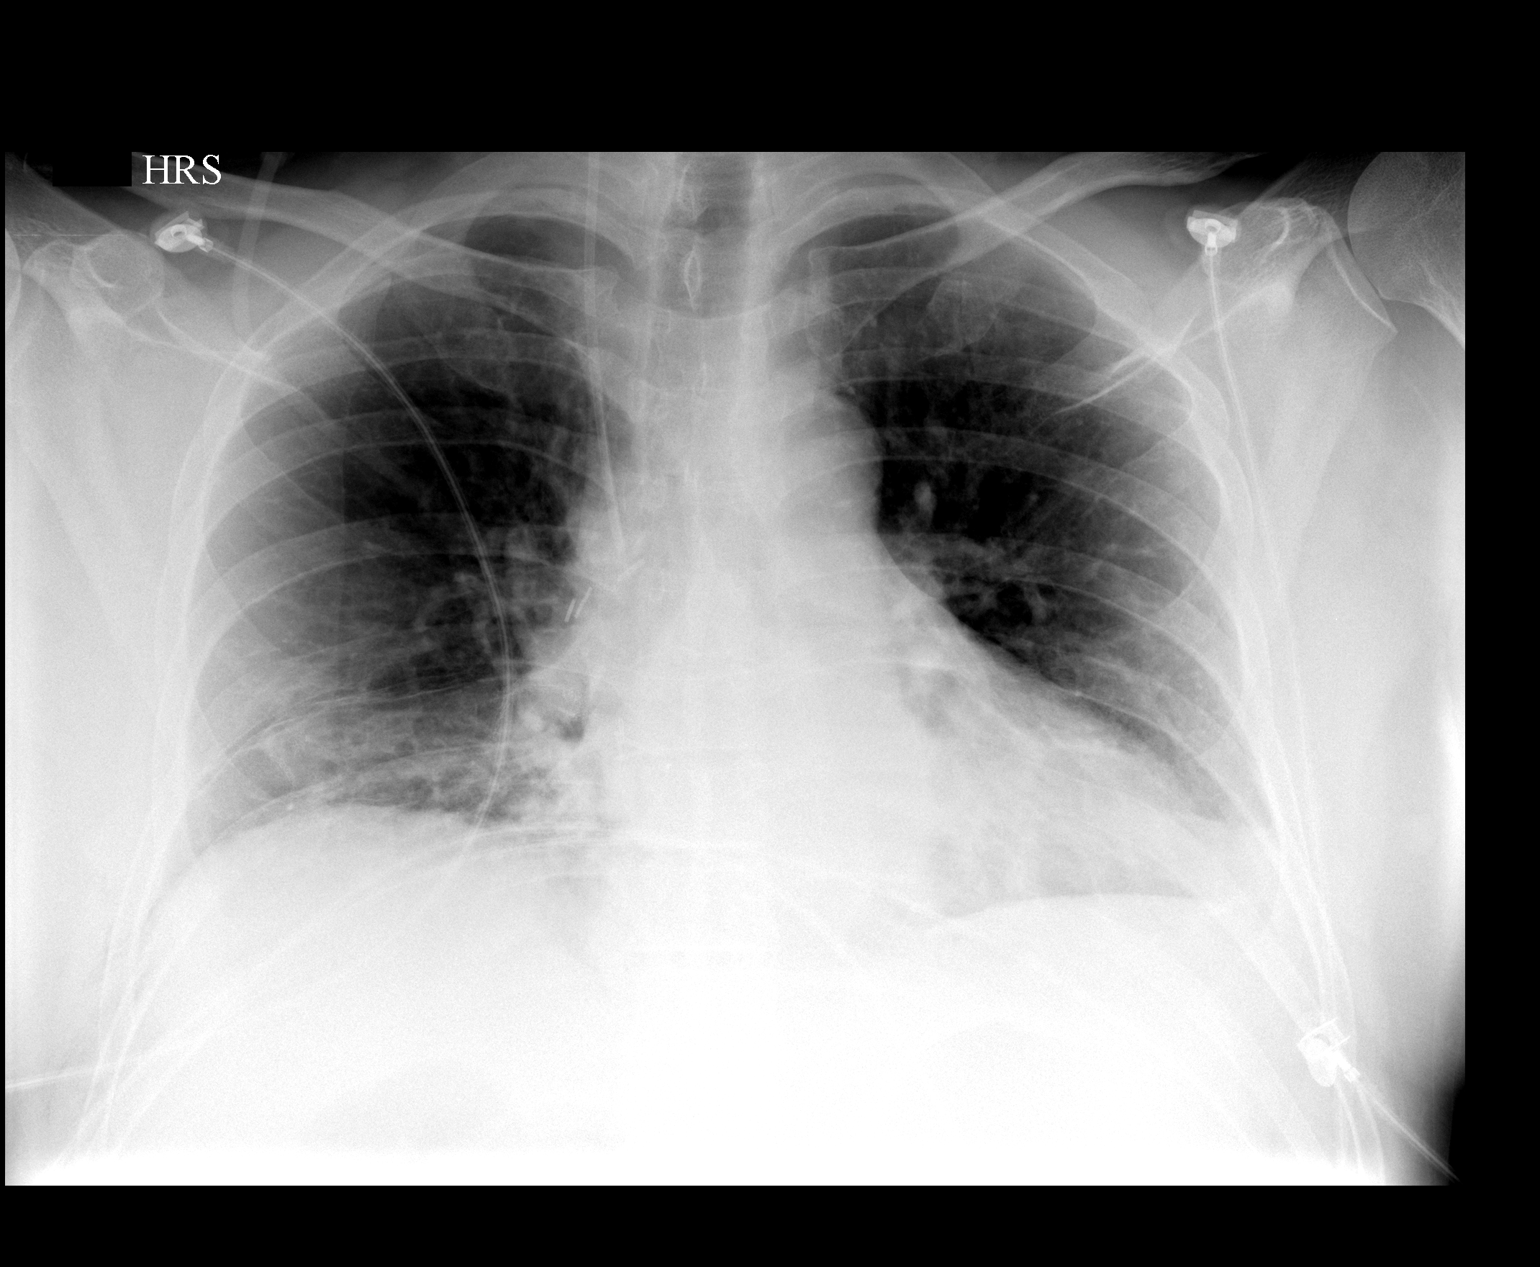

[1 of 1 positions shown; findings below may reference images not displayed]

FINDINGS: Right chest tube has been removed.  Small right apical pneumothorax, stable.  Heart size is stable.  Lungs are low in volume with improving bibasilar atelectasis. Question ? small left pleural effusion.  Postoperative changes in the right hemithorax.
IMPRESSION: 1. Tiny residual right apical pneumothorax after removal of right chest tube. Postoperative changes in the right hemithorax. 
 2. Improving bibasilar atelectasis.  
 3. Suspect small left pleural effusion.

## 2006-12-22 IMAGING — CR DG CHEST 2V
2 series · 2 of 2 positions shown · non-contrast
Comparison: Yesterday?s portable.

CLINICAL DATA: Post-surgery for lung lesion.
 CHEST - 2 VIEW:

[w chest pa]
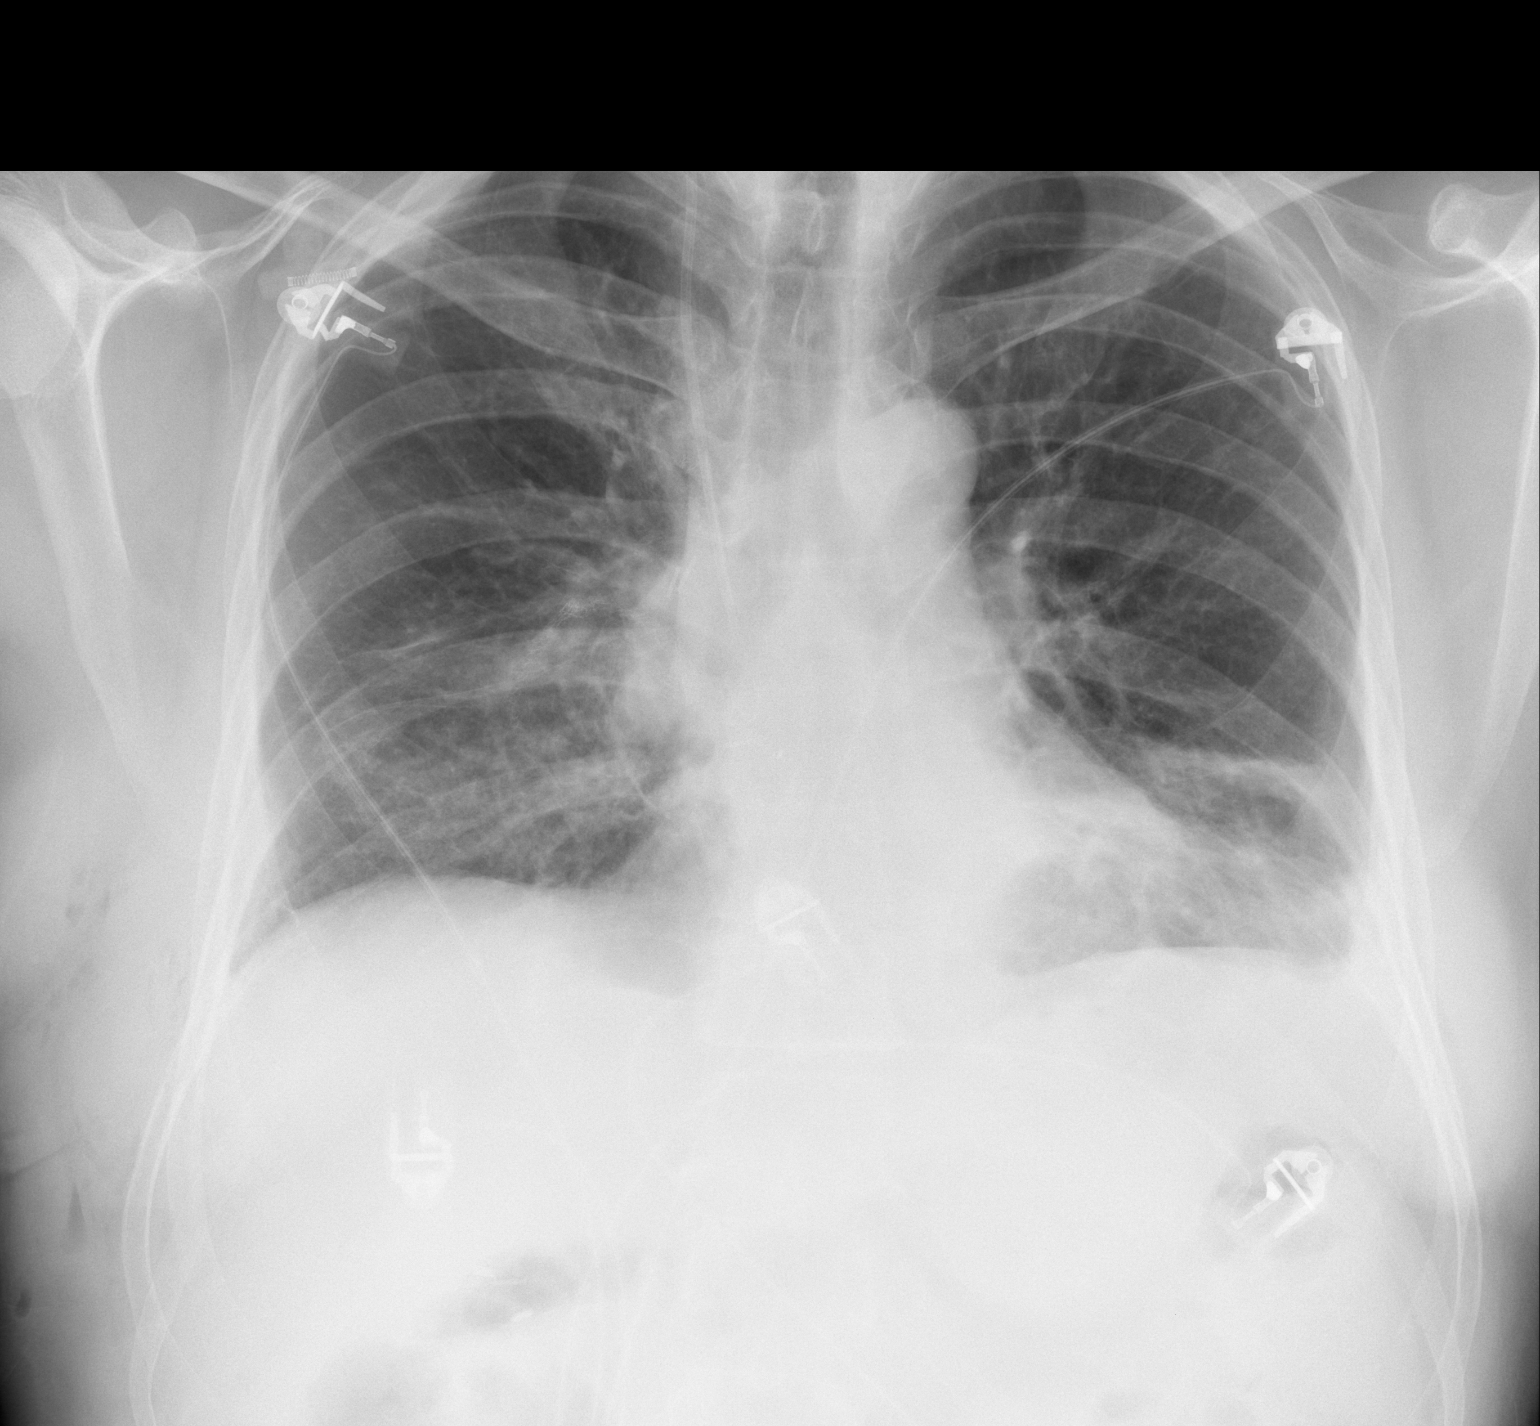

[w chest lat]
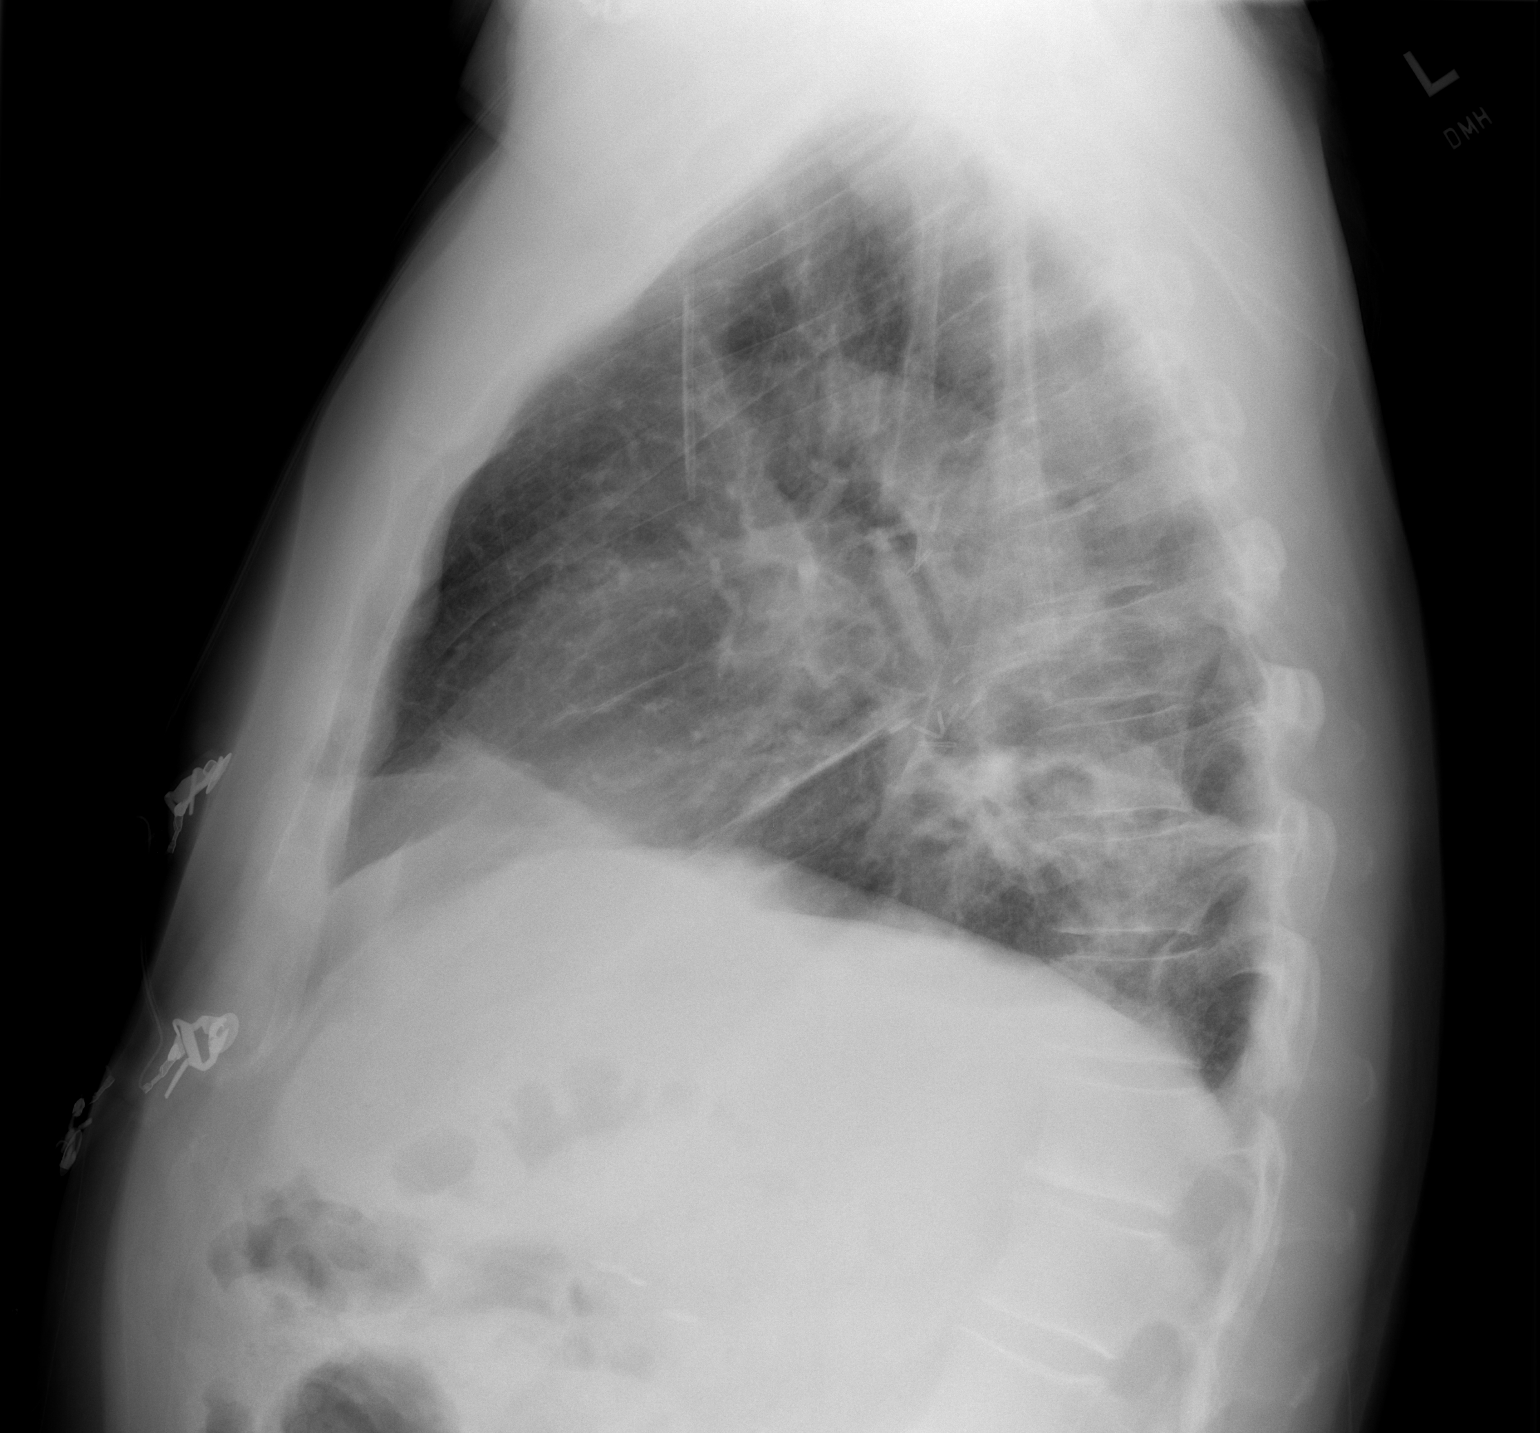

[2 of 2 positions shown; findings below may reference images not displayed]

FINDINGS: There is a small right apical pneumothorax which has not significantly changed.  Bilateral subsegmental atelectasis is again noted with perhaps some increase at the left base.  No significant pleural effusion.
IMPRESSION: 1.  Small right apical pneumothorax ? no change.
 2.  Bibasilar atelectasis with increase at the left base since yesterday?s portable.

## 2006-12-29 ENCOUNTER — Ambulatory Visit: Payer: Self-pay | Admitting: Thoracic Surgery

## 2006-12-29 ENCOUNTER — Encounter: Admission: RE | Admit: 2006-12-29 | Discharge: 2006-12-29 | Payer: Self-pay | Admitting: Thoracic Surgery

## 2006-12-29 IMAGING — CR DG CHEST 2V
2 series · 2 of 2 positions shown · non-contrast
Comparison: [DATE].

CLINICAL DATA: Lung lesion.  
 CHEST, TWO VIEWS:

[w chest pa]
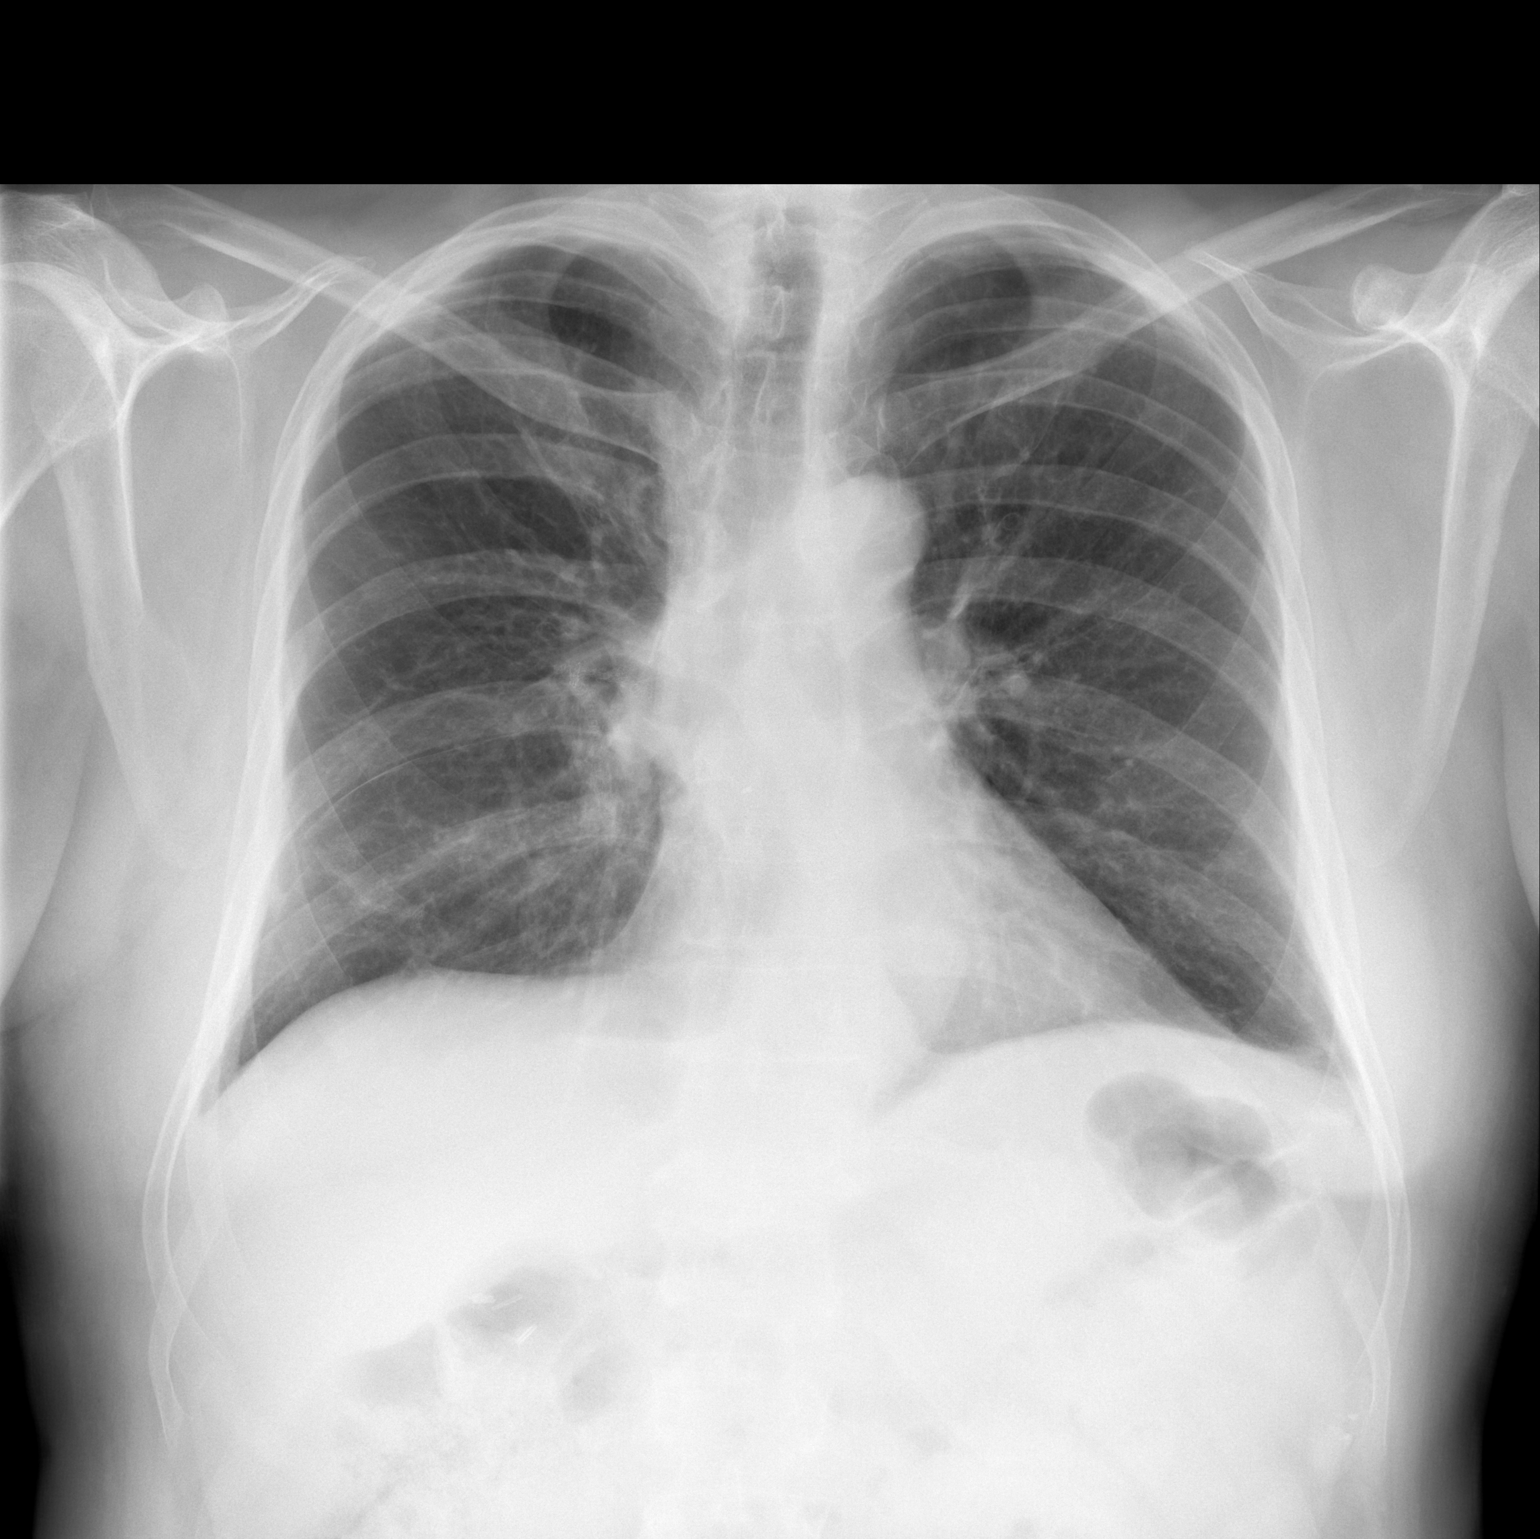

[w chest lat]
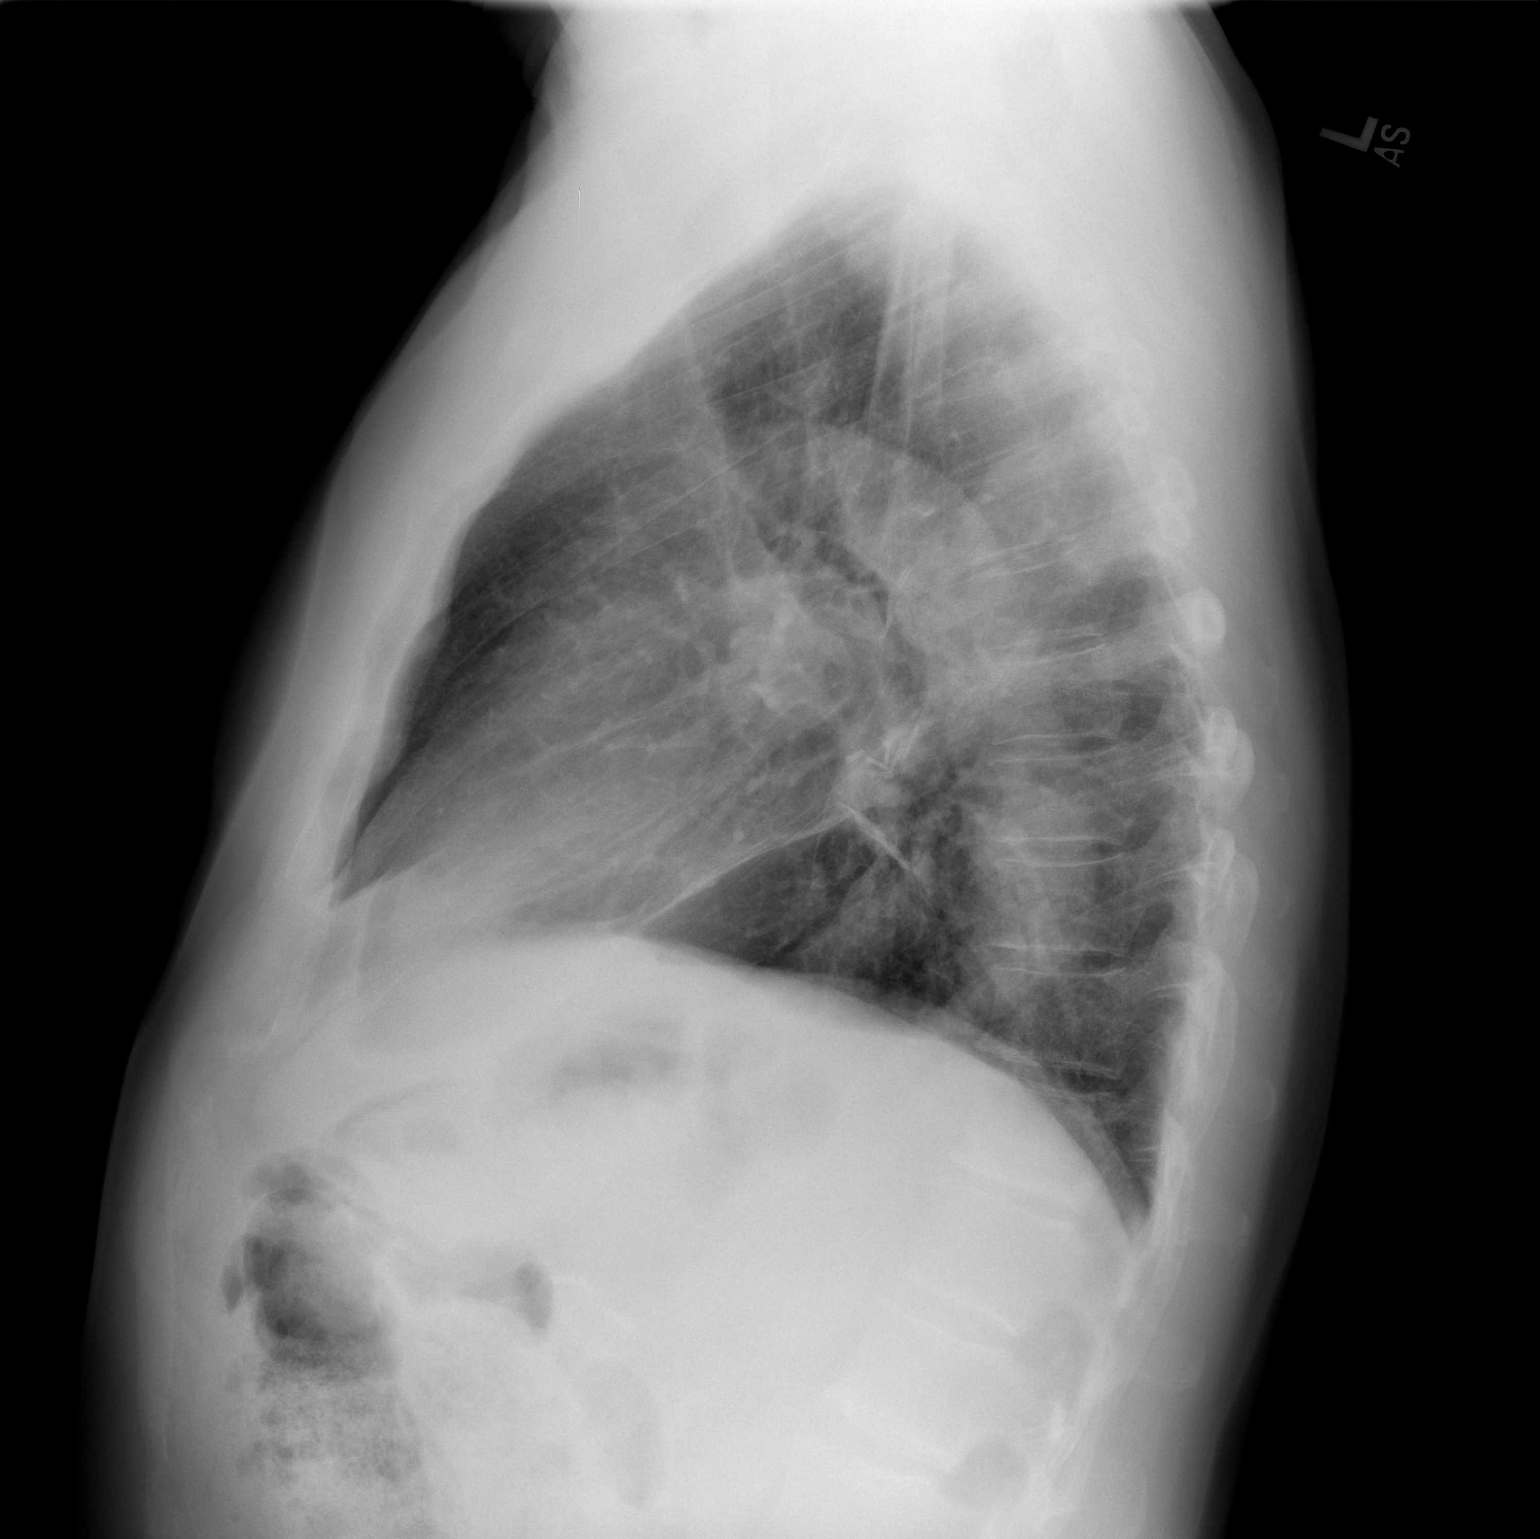

[2 of 2 positions shown; findings below may reference images not displayed]

FINDINGS: Basilar atelectasis has improved.  No pneumothoraces are seen.  The heart is upper normal in size.  No effusions are present.  Pulmonary vascularity is within normal limits.
IMPRESSION: No pneumothorax.  Improved basilar atelectasis.

## 2007-01-06 ENCOUNTER — Ambulatory Visit: Payer: Self-pay | Admitting: Oncology

## 2007-01-10 LAB — URIC ACID: Uric Acid, Serum: 5 mg/dL (ref 2.4–7.0)

## 2007-01-10 LAB — CBC WITH DIFFERENTIAL/PLATELET
BASO%: 1.2 % (ref 0.0–2.0)
EOS%: 4.1 % (ref 0.0–7.0)
MCH: 30.7 pg (ref 28.0–33.4)
MCHC: 35.4 g/dL (ref 32.0–35.9)
MCV: 86.7 fL (ref 81.6–98.0)
MONO%: 9.5 % (ref 0.0–13.0)
RDW: 10 % — ABNORMAL LOW (ref 11.2–14.6)
lymph#: 1.5 10*3/uL (ref 0.9–3.3)

## 2007-01-10 LAB — LACTATE DEHYDROGENASE: LDH: 176 U/L (ref 94–250)

## 2007-01-10 LAB — COMPREHENSIVE METABOLIC PANEL
Albumin: 4.1 g/dL (ref 3.5–5.2)
CO2: 25 mEq/L (ref 19–32)
Calcium: 9.1 mg/dL (ref 8.4–10.5)
Chloride: 105 mEq/L (ref 96–112)
Glucose, Bld: 90 mg/dL (ref 70–99)
Potassium: 4.5 mEq/L (ref 3.5–5.3)
Sodium: 140 mEq/L (ref 135–145)
Total Protein: 6.7 g/dL (ref 6.0–8.3)

## 2007-01-10 LAB — MORPHOLOGY: PLT EST: ADEQUATE

## 2007-01-19 ENCOUNTER — Encounter: Payer: Self-pay | Admitting: Oncology

## 2007-01-19 ENCOUNTER — Encounter: Admission: RE | Admit: 2007-01-19 | Discharge: 2007-01-19 | Payer: Self-pay | Admitting: Thoracic Surgery

## 2007-01-19 ENCOUNTER — Ambulatory Visit: Payer: Self-pay | Admitting: Thoracic Surgery

## 2007-01-19 ENCOUNTER — Ambulatory Visit: Payer: Self-pay | Admitting: Oncology

## 2007-01-19 ENCOUNTER — Ambulatory Visit (HOSPITAL_COMMUNITY): Admission: RE | Admit: 2007-01-19 | Discharge: 2007-01-19 | Payer: Self-pay | Admitting: Oncology

## 2007-01-19 IMAGING — CR DG CHEST 2V
2 series · 2 of 2 positions shown · non-contrast
Comparison: none

CLINICAL DATA: Right lung surgery for cancer, [DATE].
 CHEST - 2 VIEWS:

[w chest pa]
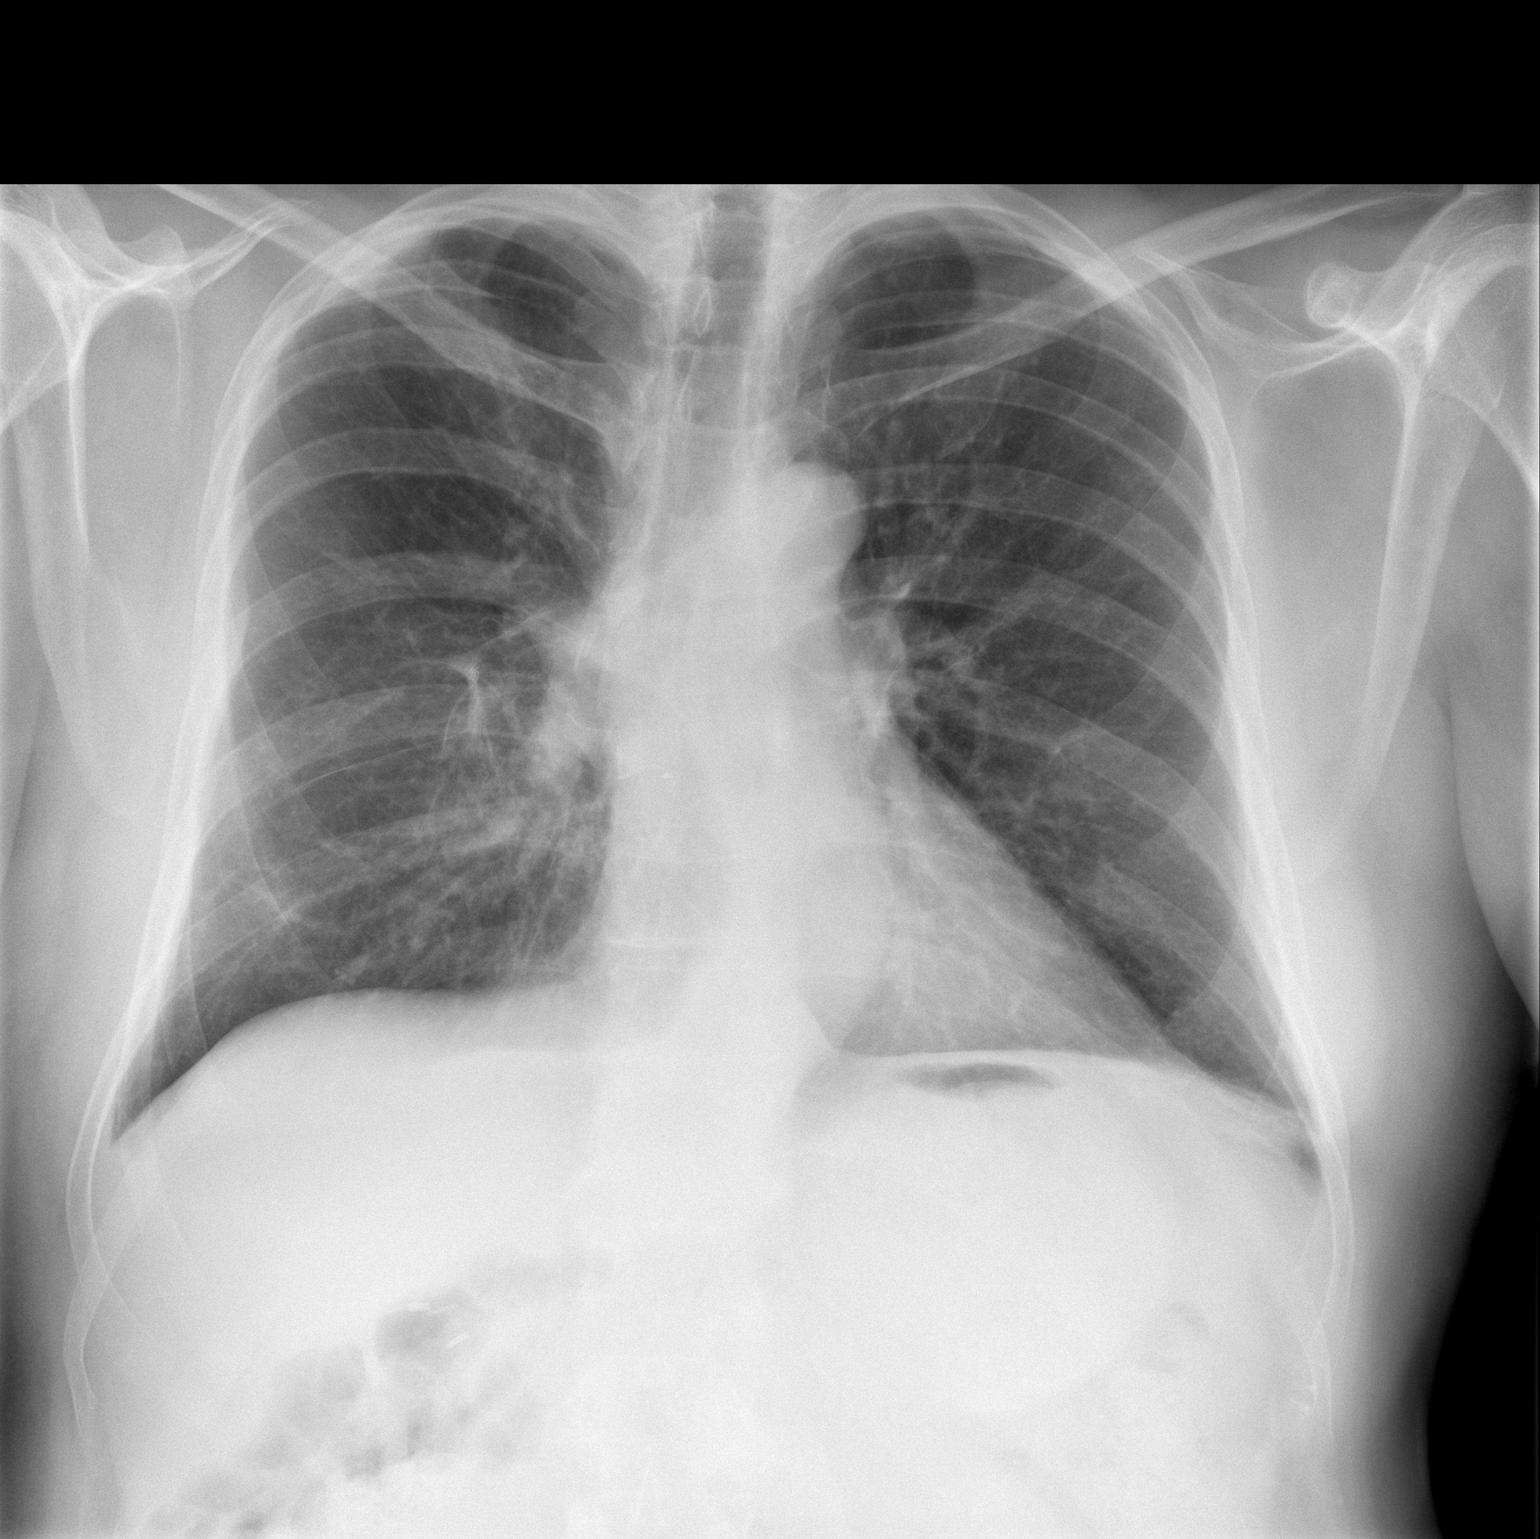

[w chest lat]
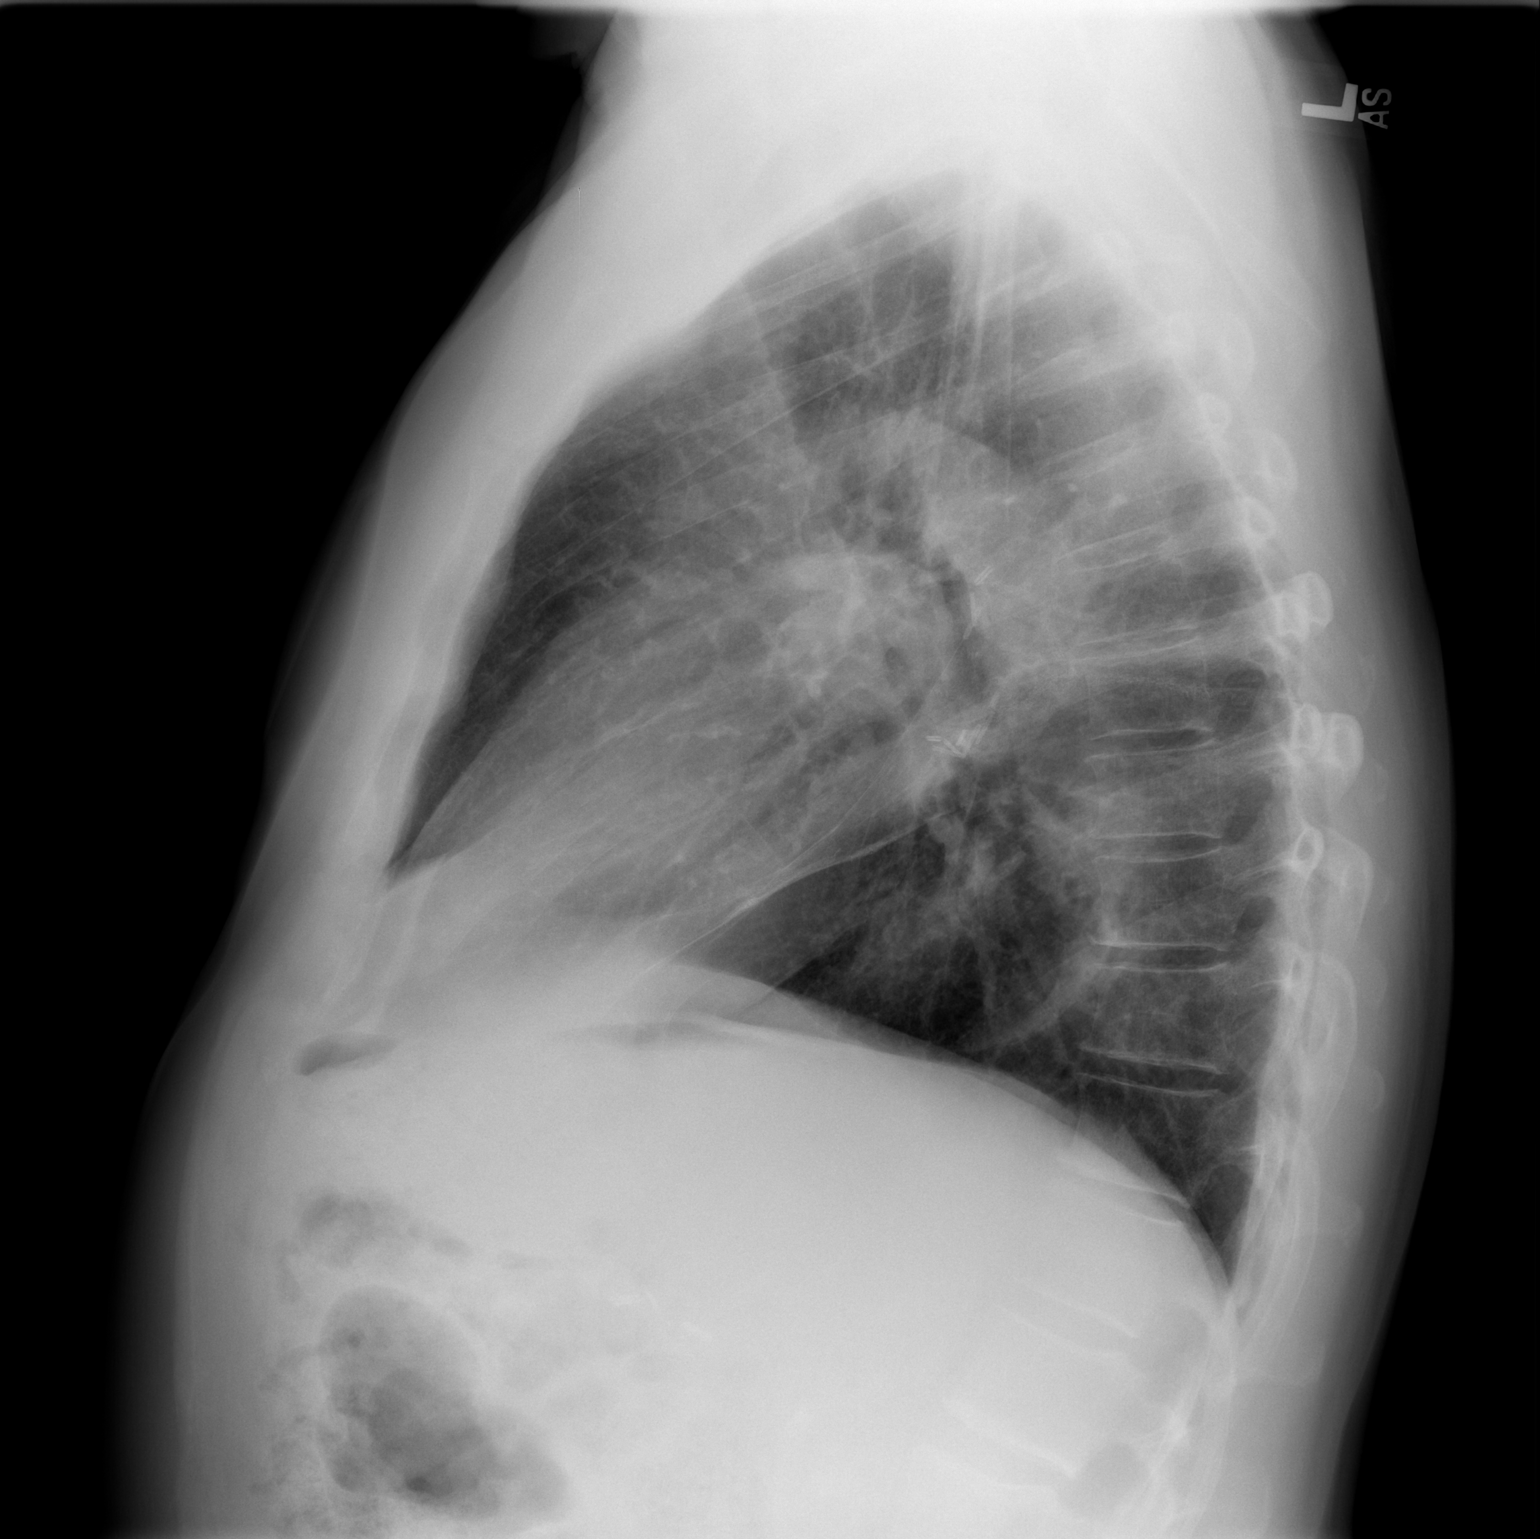

[2 of 2 positions shown; findings below may reference images not displayed]

FINDINGS: Right lower lobe and perihilar postoperative changes are stable.  Generalized prominence of bronchopulmonary markings unchanged with clear lungs otherwise.  Heart size is normal.  Mediastinum, hila, pleura, and osseous structures are stable and unremarkable.  Post cholecystectomy surgical clips incidentally noted.
IMPRESSION: 1.  Postoperative changes, right lower lobe.
 2.  Stable slight chronic bronchitis.
 3.  No active disease.

## 2007-02-17 ENCOUNTER — Encounter
Admission: RE | Admit: 2007-02-17 | Discharge: 2007-02-17 | Payer: Self-pay | Admitting: Thoracic Surgery (Cardiothoracic Vascular Surgery)

## 2007-02-17 IMAGING — CR DG CHEST 2V
2 series · 2 of 2 positions shown · non-contrast
Comparison: [DATE]

CLINICAL DATA: Lymphoma. Lung cancer.

[w chest pa]
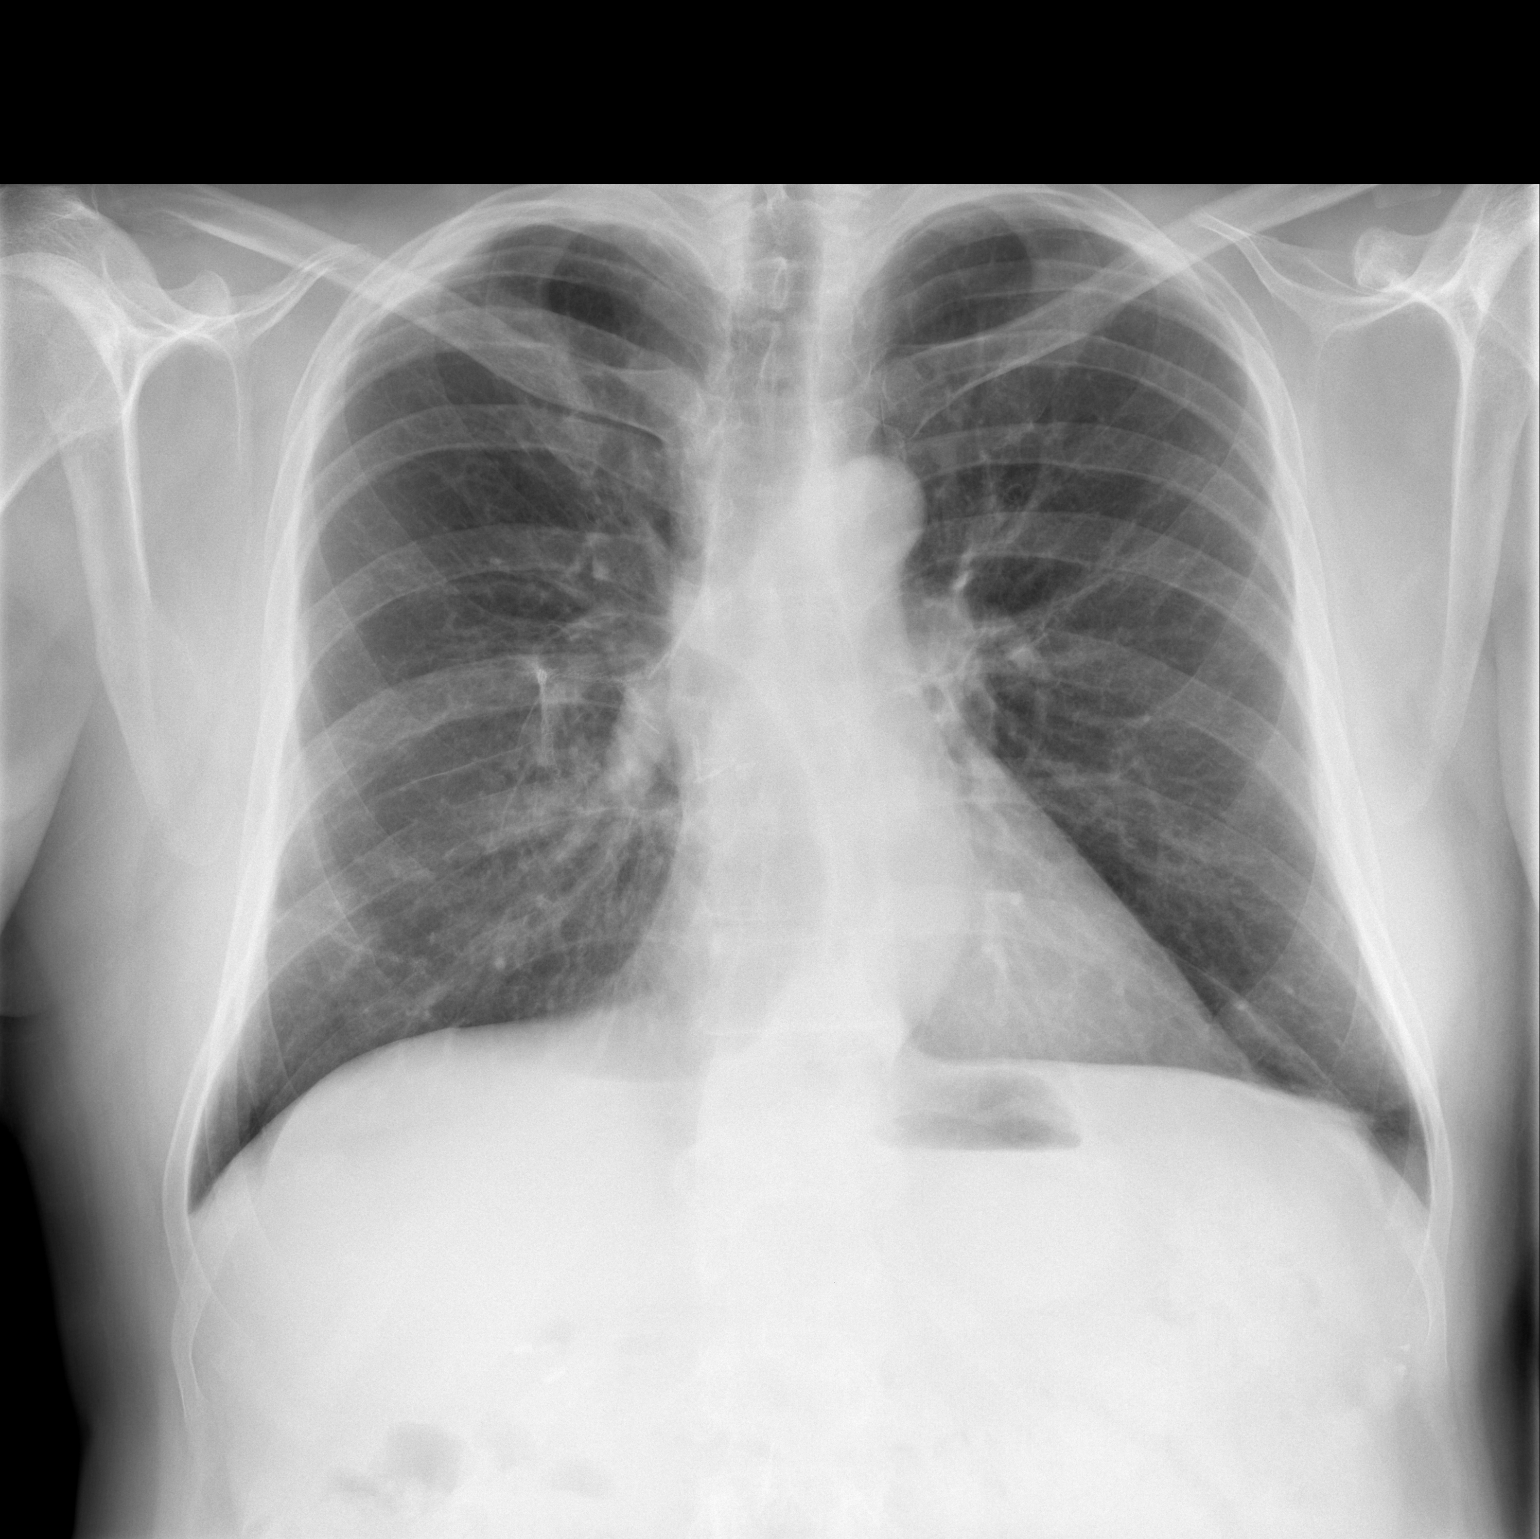

[w chest lat]
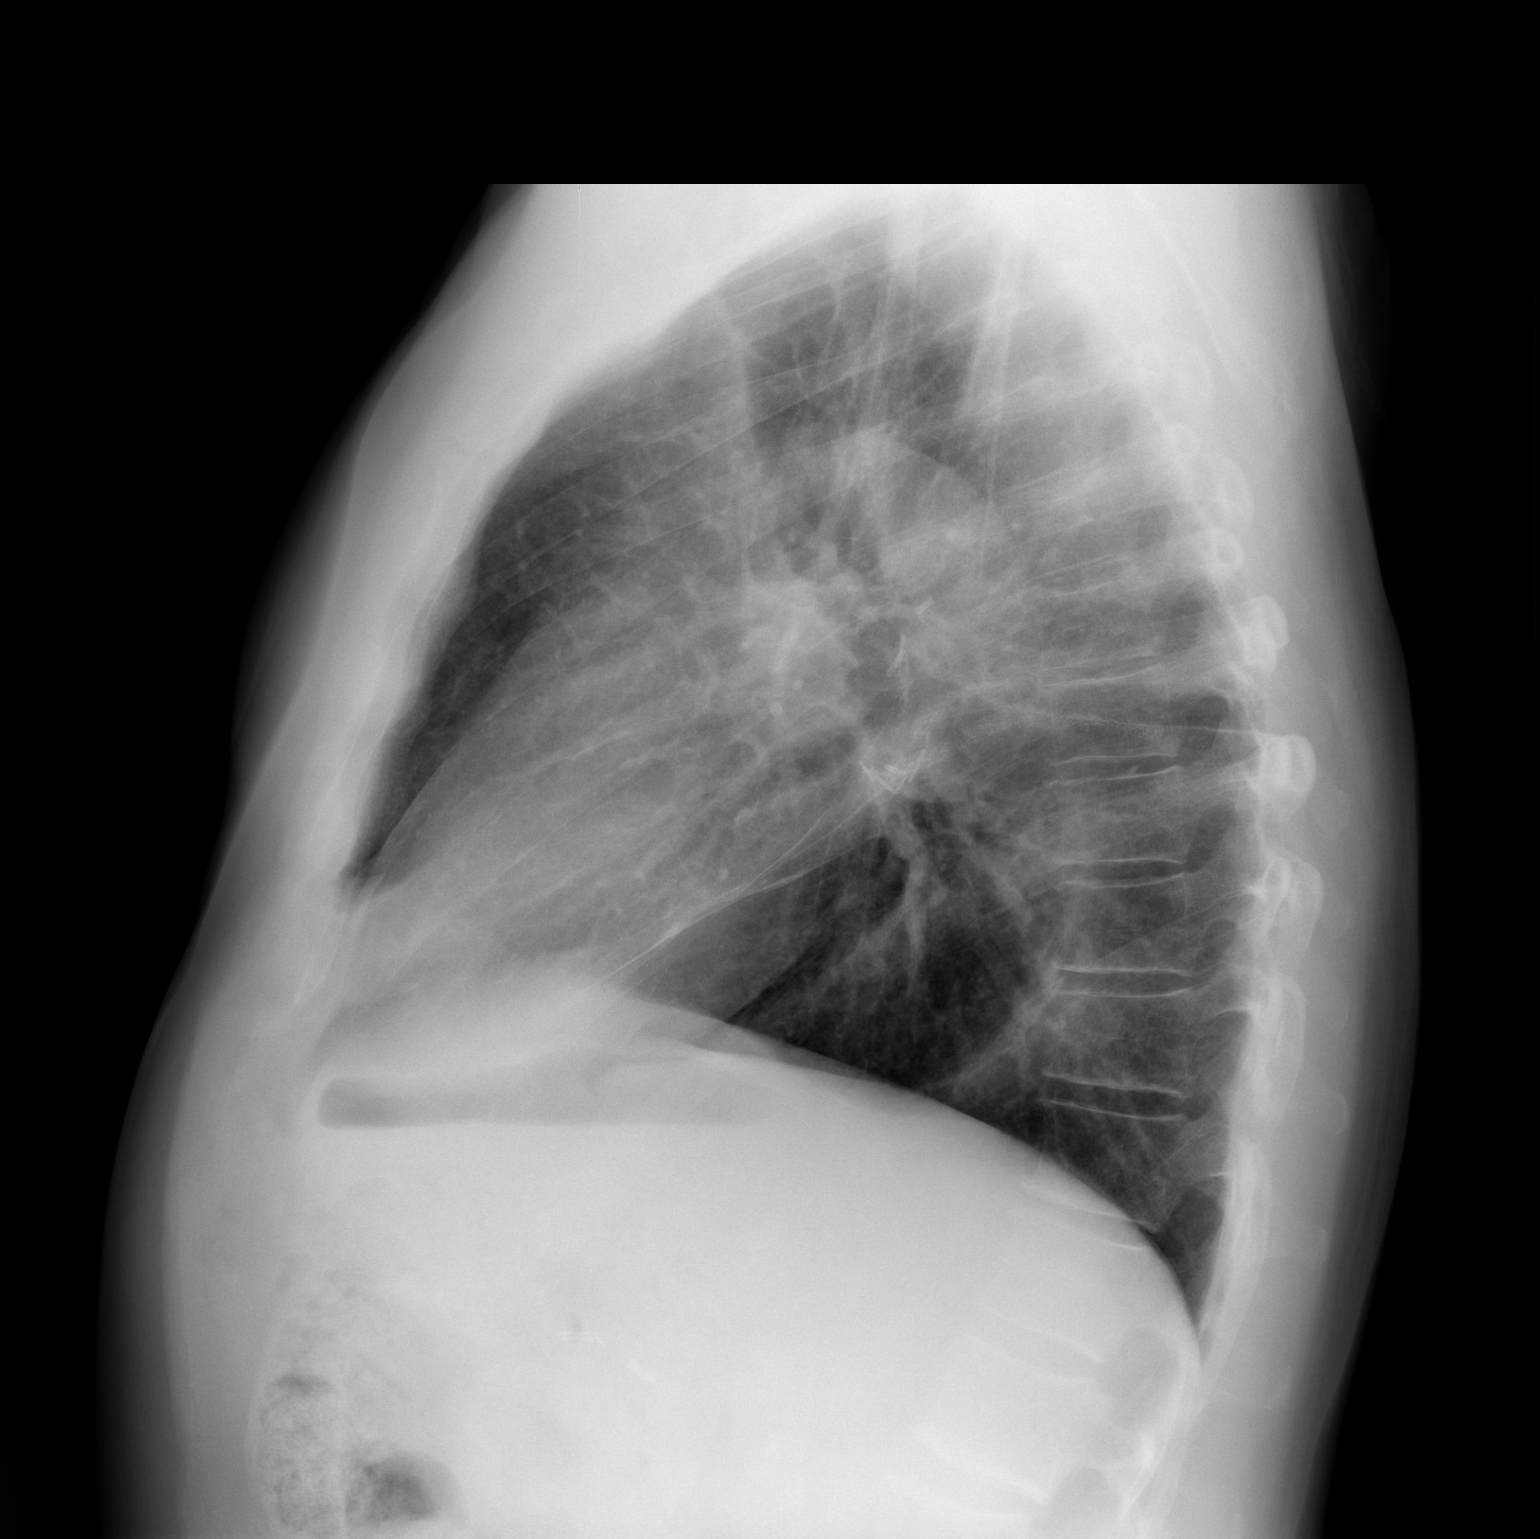

[2 of 2 positions shown; findings below may reference images not displayed]

CHEST - 2 VIEW:

The lungs are clear bilaterally. There is no focal airspace consolidation,
edema, or pleural effusion. Architectural distortion of lung parenchyma in the
inferior right hemithorax is unchanged and surgical clips are again noted in the
right axilla. The cardiopericardial silhouette is within normal limits for size.
Imaged bony structures of the thorax are intact.
IMPRESSION: Stable exam. No new or acute interval findings.

## 2007-02-20 ENCOUNTER — Observation Stay (HOSPITAL_COMMUNITY): Admission: AD | Admit: 2007-02-20 | Discharge: 2007-02-20 | Payer: Self-pay | Admitting: Oncology

## 2007-03-07 ENCOUNTER — Ambulatory Visit: Payer: Self-pay | Admitting: Thoracic Surgery

## 2007-04-04 ENCOUNTER — Ambulatory Visit: Payer: Self-pay | Admitting: Oncology

## 2007-04-05 ENCOUNTER — Ambulatory Visit: Payer: Self-pay | Admitting: Thoracic Surgery

## 2007-04-14 ENCOUNTER — Ambulatory Visit (HOSPITAL_COMMUNITY): Admission: RE | Admit: 2007-04-14 | Discharge: 2007-04-14 | Payer: Self-pay | Admitting: Oncology

## 2007-04-14 IMAGING — CT CT CHEST W/ CM
2 of 5 series · 15 of 46 positions shown, 17 images · IV contrast (agent unspecified)
Comparison: CT chest abdomen and pelvis [DATE] and [DATE].

CT CHEST

CLINICAL DATA: History of non-Hodgkins lymphoma originally
diagnosed in [TT], with chemotherapy completed in [DATE].
History of lung cancer originally diagnosed in [DATE] for
which the patient underwent partial right lower lobe resection.

CT CHEST, ABDOMEN AND PELVIS WITH CONTRAST [DATE] [DATE]:
TECHNIQUE: Multidetector CT imaging of the chest, abdomen and
pelvis was performed following the standard protocol during bolus
administration of intravenous contrast.
Contrast: 100 ml [TT] IV.  Oral contrast also
administered.

[Series 2: cap 5.0 b40f · axial · 0.82mm/px · z∈[-660,-40]mm · 12 of 140 slices shown, 14 images]
[im 8/140  soft-tissue]
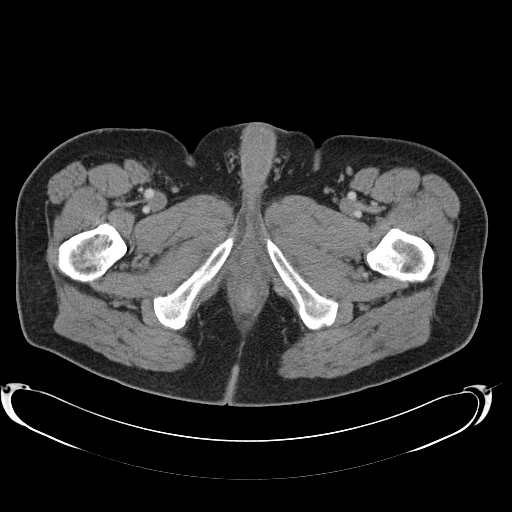
[im 8/140  bone]
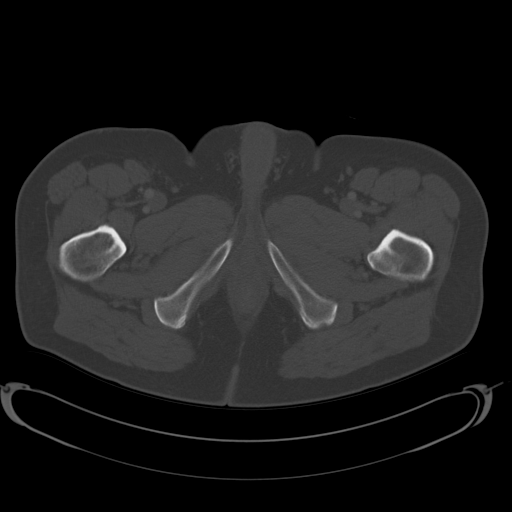
[im 24/140  soft-tissue]
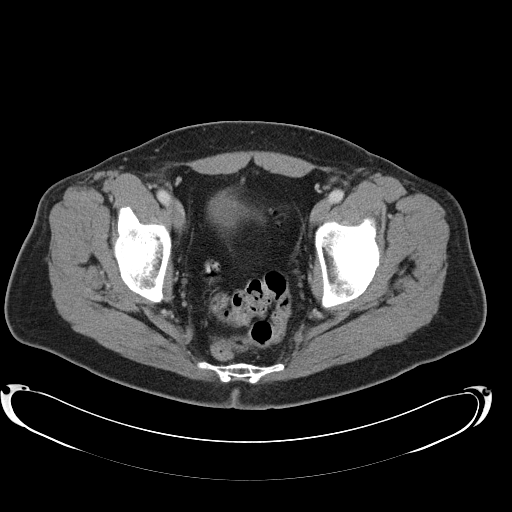
[im 31/140  soft-tissue]
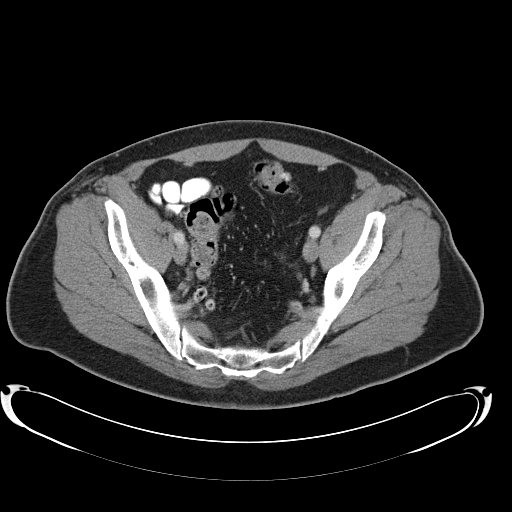
[im 39/140  soft-tissue]
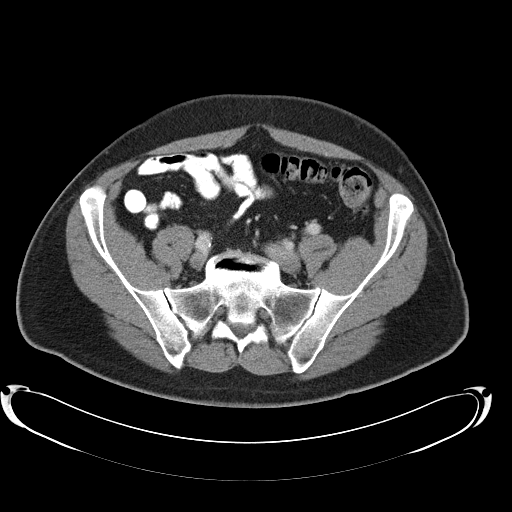
[im 55/140  soft-tissue]
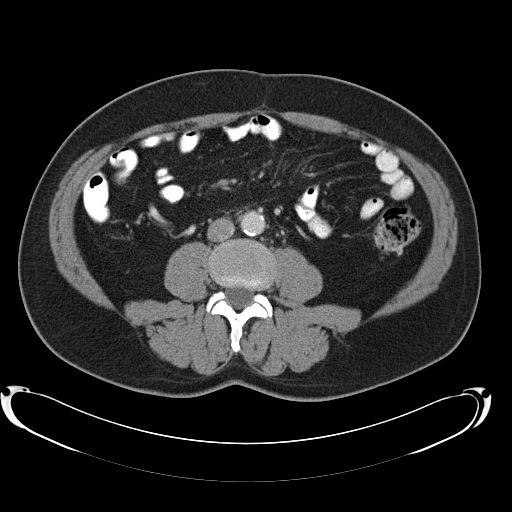
[im 62/140  soft-tissue]
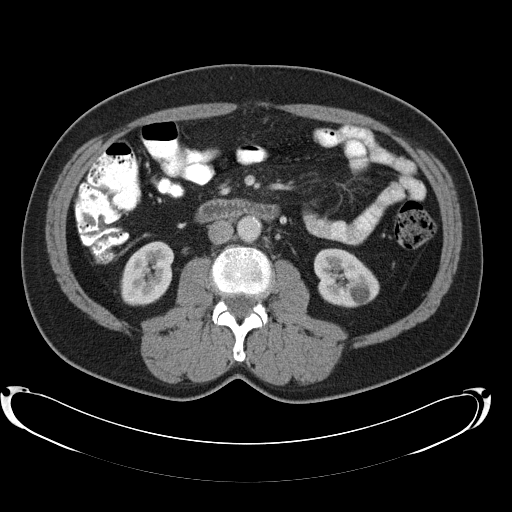
[im 78/140  soft-tissue]
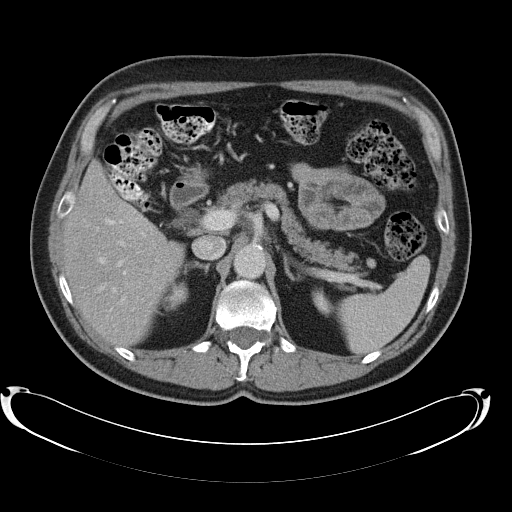
[im 85/140  soft-tissue]
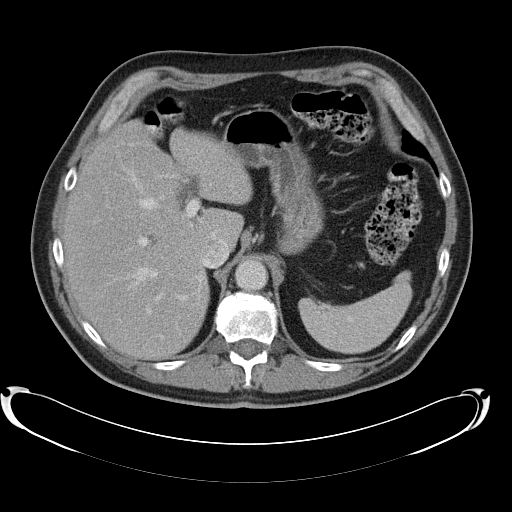
[im 101/140  soft-tissue]
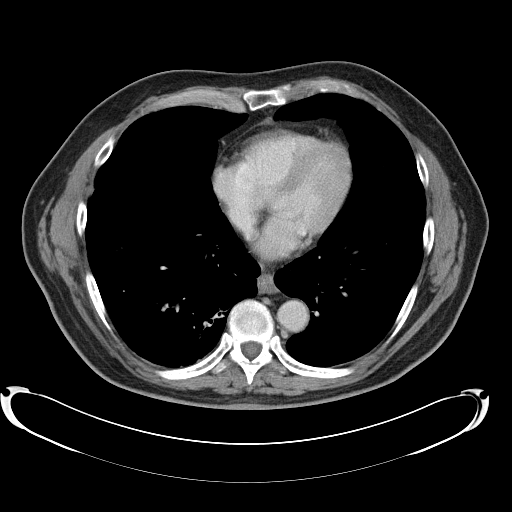
[im 101/140  bone]
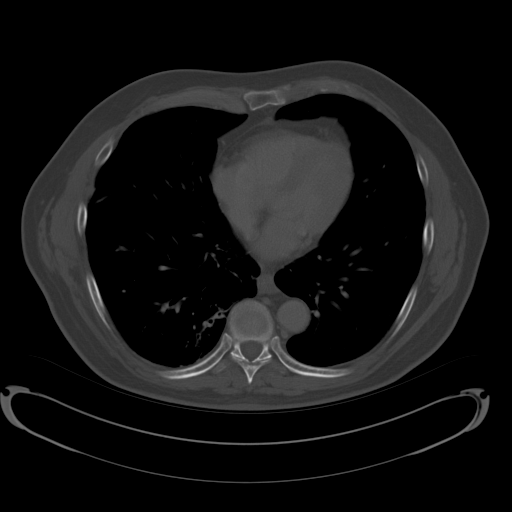
[im 109/140  soft-tissue]
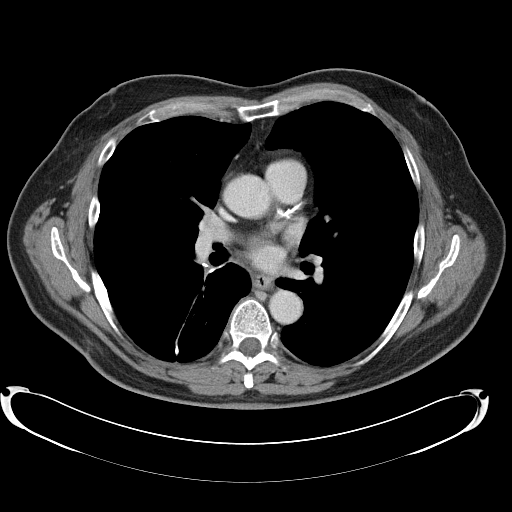
[im 116/140  soft-tissue]
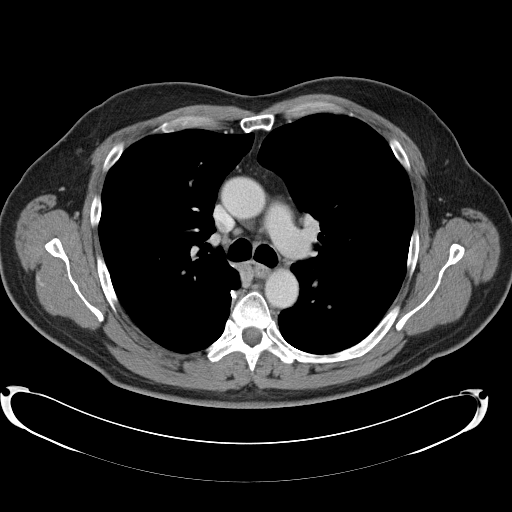
[im 132/140  soft-tissue]
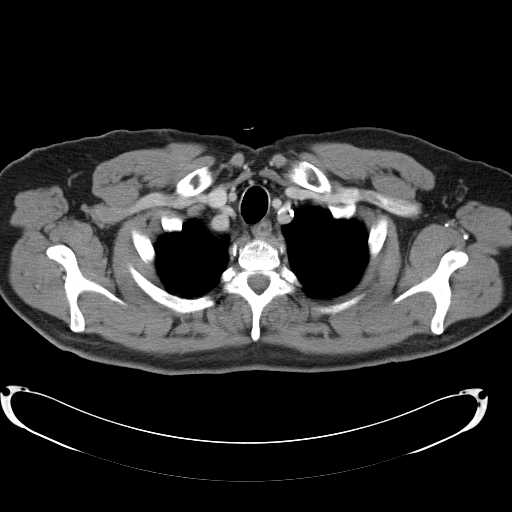

[Series 602: <mpr range> · coronal · 1.36mm/px · 3 of 59 slices shown]
[im 20/59  soft-tissue]
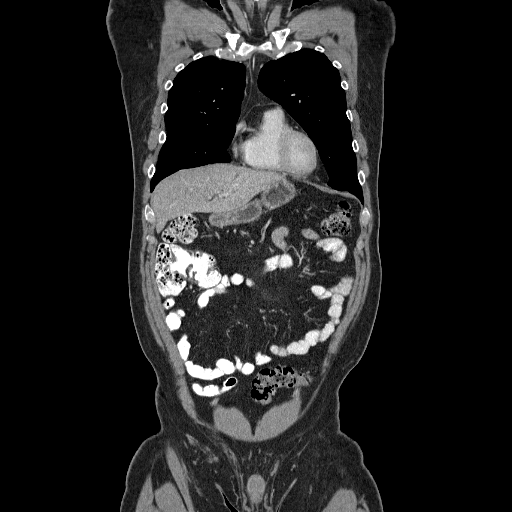
[im 26/59  soft-tissue]
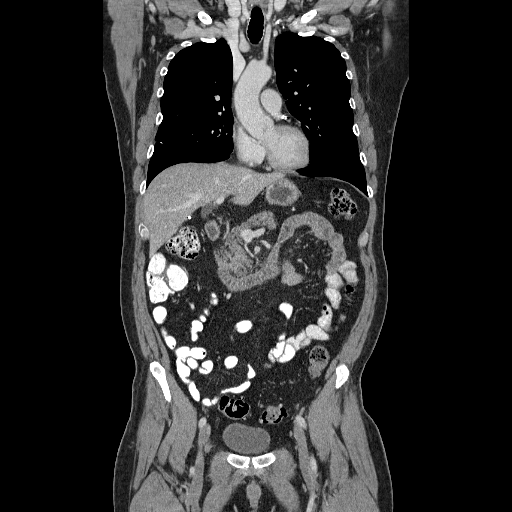
[im 33/59  soft-tissue]
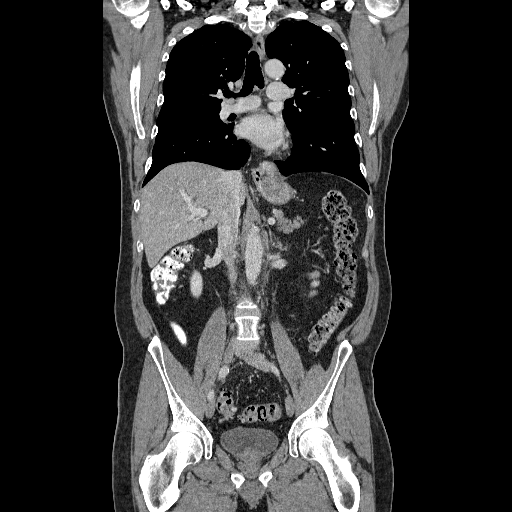

[15 of 46 positions shown; findings below may reference images not displayed]

FINDINGS: No significant mediastinal, hilar, or axillary
lymphadenopathy.  Normal heart size.  Moderate calcification
involving the LAD coronary artery.  No pericardial effusion.
Minimal thoracic aortic atherosclerosis.

Post-surgical scarring related to the right lower lobe partial
resection. No evidence of tumor recurrence at the surgical site.
No pulmonary parenchymal nodules or masses.  Emphysematous changes
in the upper lobes.  No localized airspace consolidation.  No
pleural effusions.

Stable moderately large hiatal hernia.  Bone window images
demonstrate mild mid and lower thoracic spondylosis but no evidence
of osseous metastatic disease.
IMPRESSION: 1.  No evidence of recurrent or metastatic lung cancer in the
chest.
2.  No evidence of lymphoma in the chest.
3.  Post-surgical right lower lobe scarring.  COPD/emphysema.  No
acute cardiopulmonary disease.
4.  Stable moderately large hiatal hernia.

CT ABDOMEN
FINDINGS: Stable small cysts in the left lobe and posterior
segment right lobe of the liver; no new or suspicious focal hepatic
abnormalities.  Surgically absent gallbladder.  Mild intra and
extrahepatic biliary ductal dilation, unchanged, related to the
previous cholecystectomy.  Spleen normal in appearance with focus
of accessory splenic tissue medial to the spleen at the hilum.
Normal pancreas and adrenal glands.  Normal right kidney.  Stable
approximate 2 cm simple cyst lower pole left kidney; no significant
abnormalities involving the left kidney.  Mild abdominal aortic
atherosclerosis without mass that aneurysm.  Streaky soft tissue in
the retroperitoneum consistent with treated lymphoma, unchanged.
No evidence of enlarging lymphadenopathy in the abdomen.  No
ascites.  Visualized small bowel unremarkable.  Large amount of
stool throughout the visualized colon with descending colon
diverticulosis.  Bone window images demonstrating severe
degenerative disc disease L5-S1 but no evidence of osseous
metastatic disease.
IMPRESSION: 1.  No evidence of metastatic disease in the abdomen.
2.  No evidence of recurrent lymphoma in the abdomen.  Streaky soft
tissue in the retroperitoneum consistent with treated lymphoma.
3.  No acute abnormalities in the abdomen.
4.  Large amount of stool throughout the colon.  Descending colon
diverticulosis.

CT PELVIS
FINDINGS: Iliofemoral atherosclerosis without aneurysm.  No
significant lymphadenopathy.  Large amount of stool throughout the
visualized colon.  Sigmoid diverticulosis without evidence of
diverticulitis.  Median lobe prostate gland enlargement, unchanged.
Urinary bladder decompressed and unremarkable.  No ascites.  Bone
window images unremarkable apart from degenerative changes in the
sacroiliac joints.
IMPRESSION: 1.  No evidence of metastatic disease in the pelvis.
2.  No evidence of recurrent lymphoma in the pelvis.
3.  No acute abnormalities in the pelvis.
4.  Large amount of stool throughout the visualized colon.  Sigmoid
colon diverticulosis.
5.  Median lobe prostate gland enlargement, stable.

## 2007-04-18 ENCOUNTER — Ambulatory Visit (HOSPITAL_COMMUNITY): Admission: RE | Admit: 2007-04-18 | Discharge: 2007-04-18 | Payer: Self-pay | Admitting: Oncology

## 2007-04-18 IMAGING — CR DG CHEST 2V
2 series · 2 of 2 positions shown · non-contrast
Comparison: [DATE]

CLINICAL DATA: Dyspnea/lymphoma in remission

CHEST - 2 VIEW

[w chest pa]
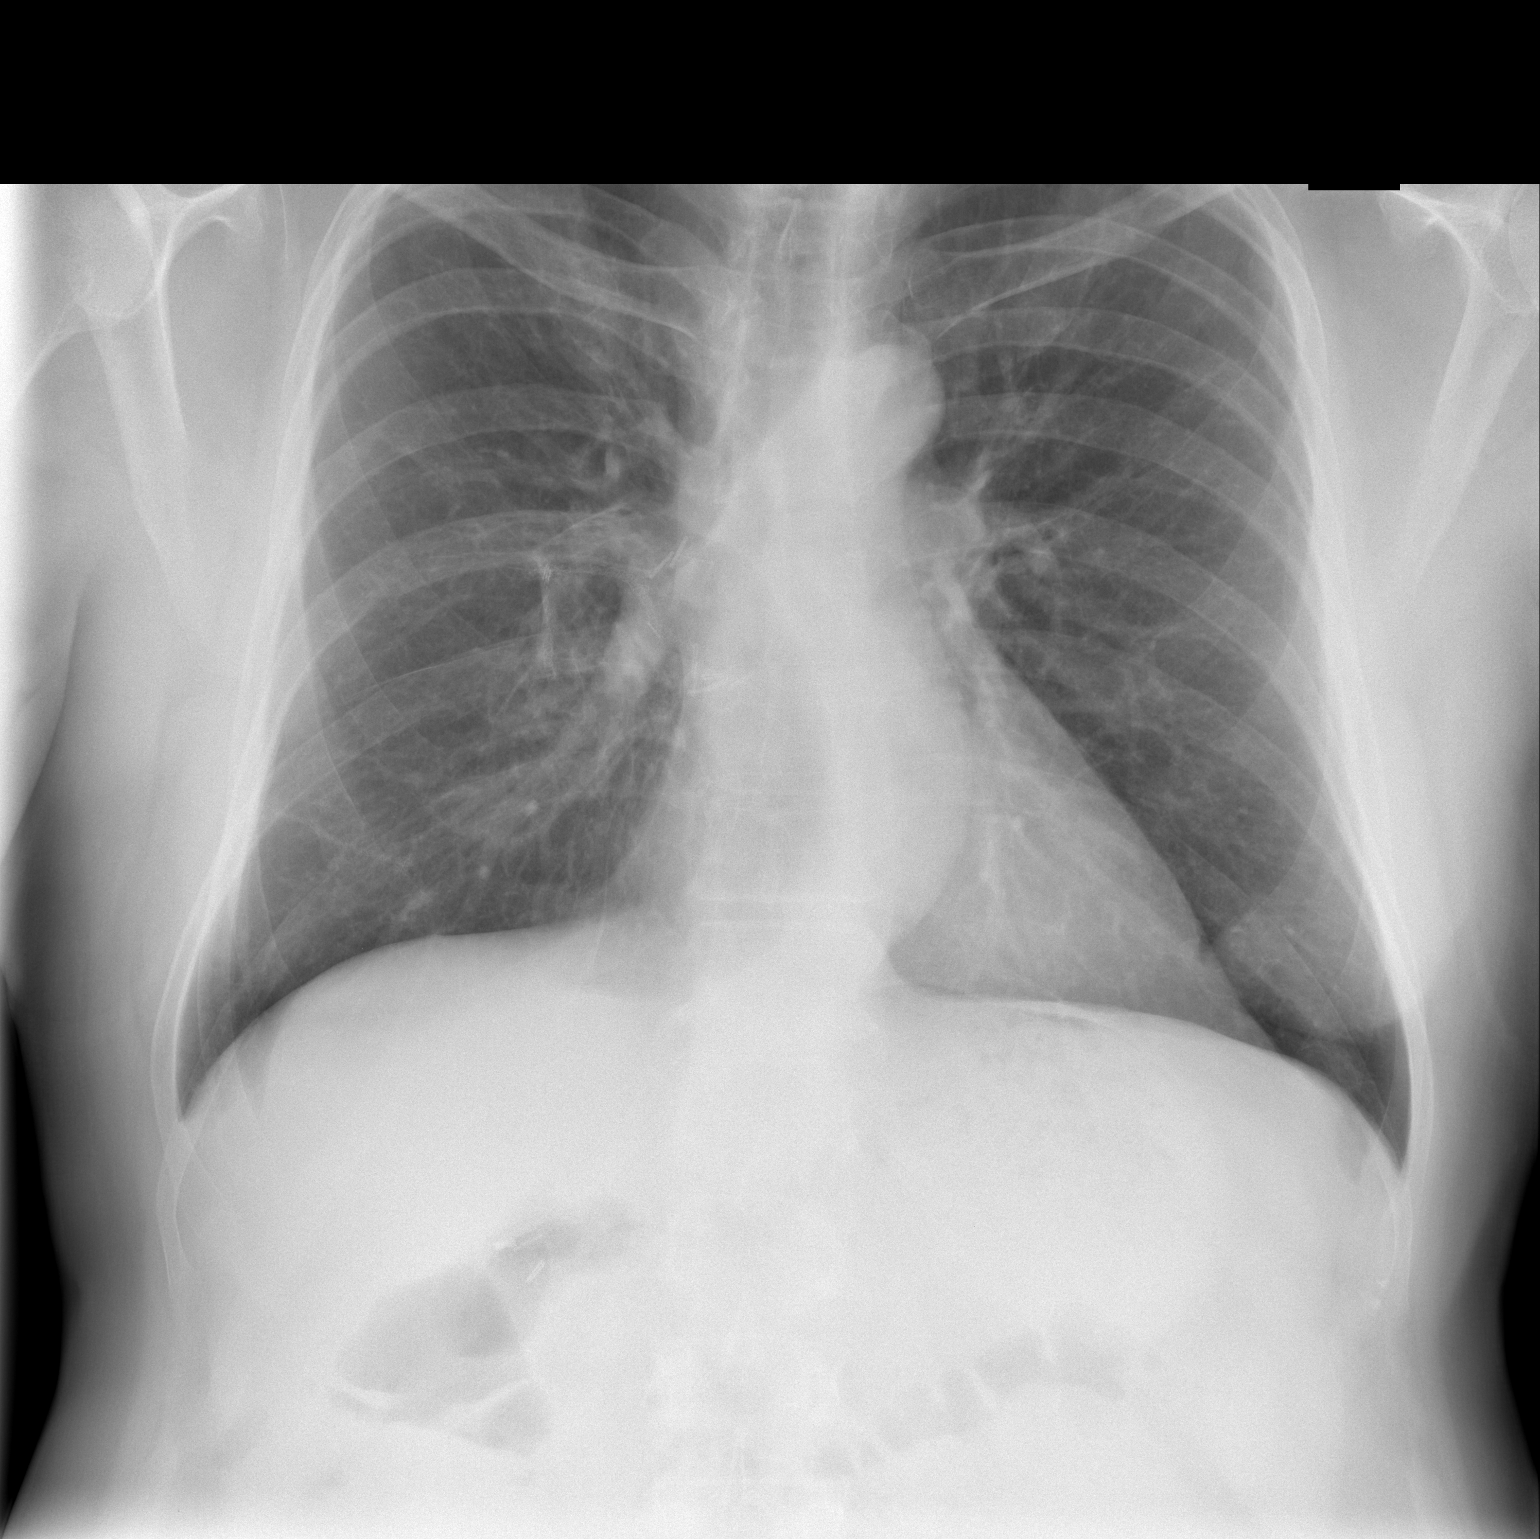

[w chest lat]
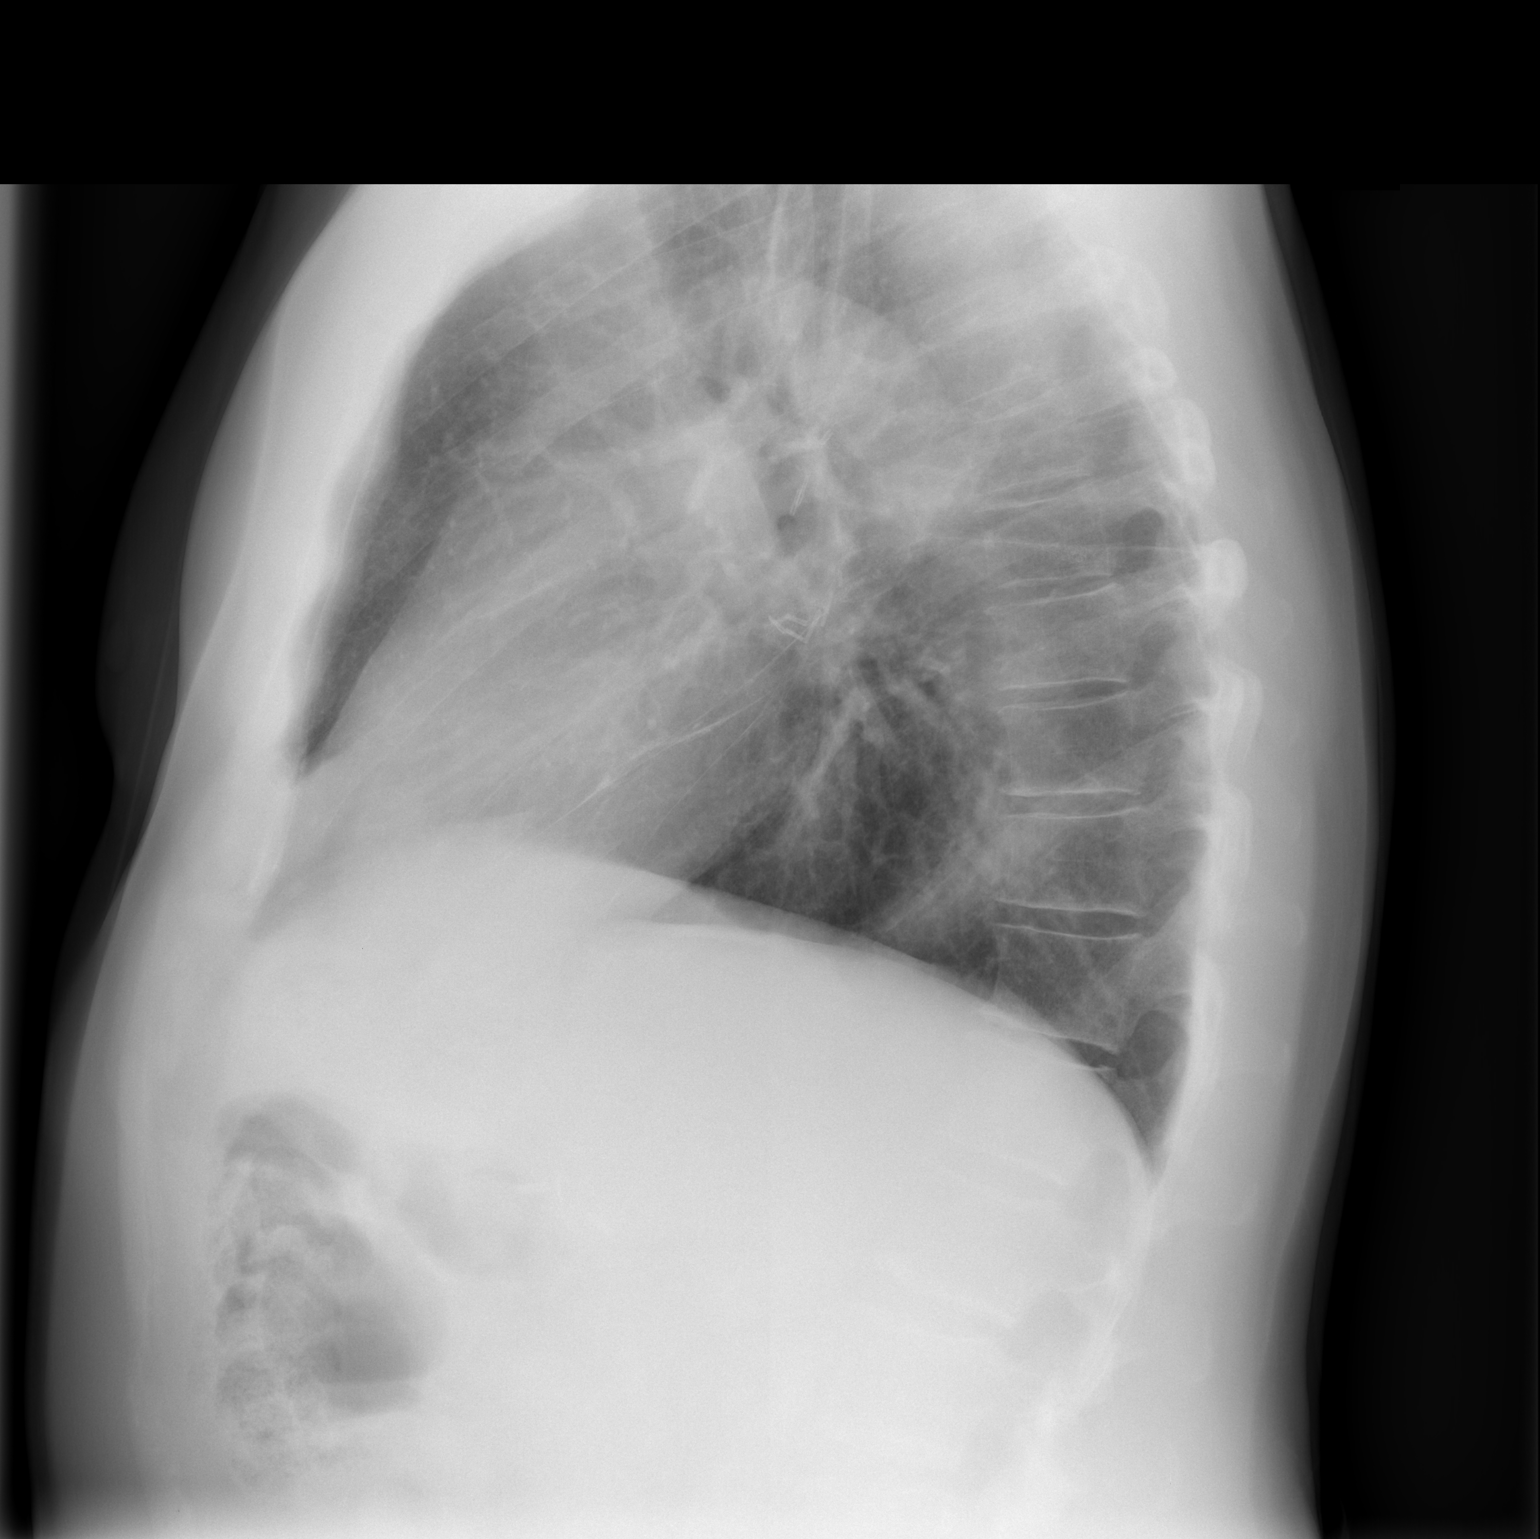

[2 of 2 positions shown; findings below may reference images not displayed]

FINDINGS: There are chronic and postoperative changes with prior
surgery in the right central lung.  There is some scarring in the
right lung apex. No acute process or interval change.  Osseous
structures intact.
IMPRESSION: Chronic and postoperative changes - no active process or interval
change.

## 2007-04-18 IMAGING — NM NM PULM PERFUSION & VENT (REBREATHING & WASHOUT)
2 series · 4 of 4 positions shown · non-contrast
Comparison: None

CLINICAL DATA: Dyspnea.

NUCLEAR MEDICINE VENTILATION AND PERFUSION SCAN
TECHNIQUE: Perfusion images were obtained in multiple projections
after intravenous injection of radiopharmaceutical. Sequential
dynamic ventilation images were obtained in standard planar
projections following inhalation of radiopharmaceutical.
Radiopharmaceutical: 11.3 mCi 133 xenon and 5.3 mCi technetium MAA

[Series 1: vq ventlung · 3.25mm/px · 2 of 2 frames shown (1 of 2)]
[frame 1/2  full-range]
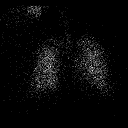
[frame 2/2]
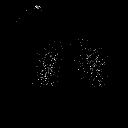

[Series 1: vq ventlung · 3.26mm/px · 2 of 2 frames shown (2 of 2)]
[frame 1/2]
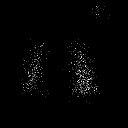
[frame 2/2]
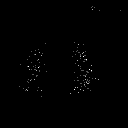

[4 of 4 positions shown; findings below may reference images not displayed]

FINDINGS: No segmental or subsegmental defects on the perfusion
study.  No ventilatory abnormality.
IMPRESSION: Essentially normal study - no evidence for pulmonary emboli.

## 2007-04-19 ENCOUNTER — Encounter: Admission: RE | Admit: 2007-04-19 | Discharge: 2007-04-19 | Payer: Self-pay | Admitting: Interventional Cardiology

## 2007-04-19 IMAGING — US US EXTREM LOW VENOUS BILAT
1 series · 14 of 24 positions shown · non-contrast
Comparison: None.

CLINICAL DATA: Bilateral leg pain

VENOUS DUPLEX ULTRASOUND OF BILATERAL LOWER EXTREMITIES
TECHNIQUE: Gray-scale sonography with graded compression, as well
as color Doppler and duplex ultrasound, were performed to evaluate
the deep venous system of both lower extremities from the level of
the common femoral vein through the popliteal and proximal calf
veins. Spectral Doppler was utilized to evaulate flow at rest and
with distal augmentation maneuvers.

[Series 1: us extrem low venous bilat · 14 of 53 slices shown]
[im 1/53]
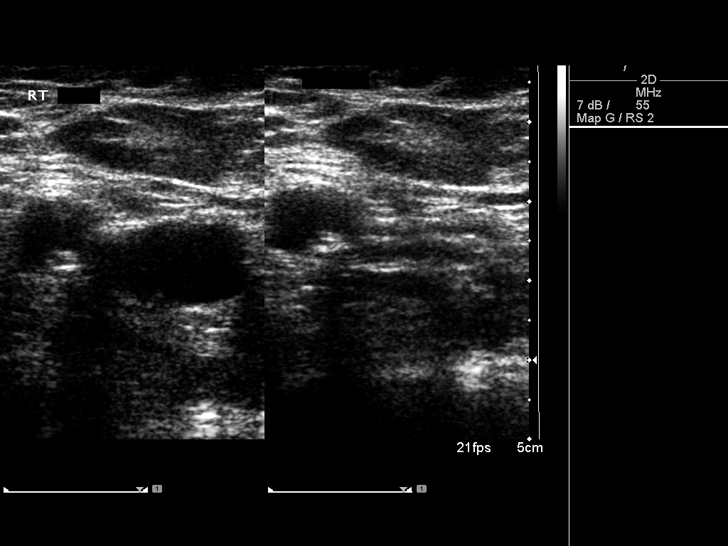
[im 5/53]
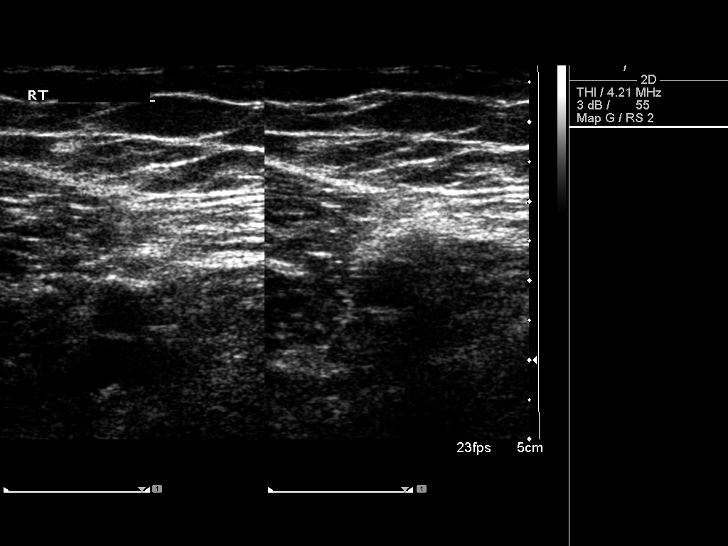
[im 10/53]
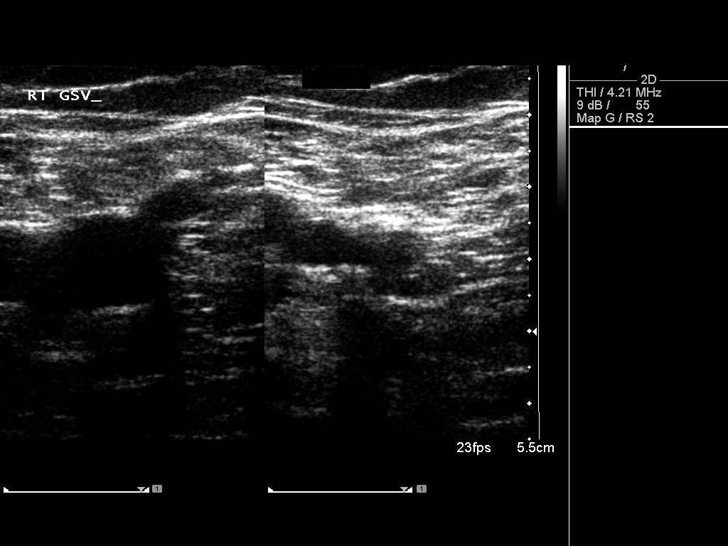
[im 14/53]
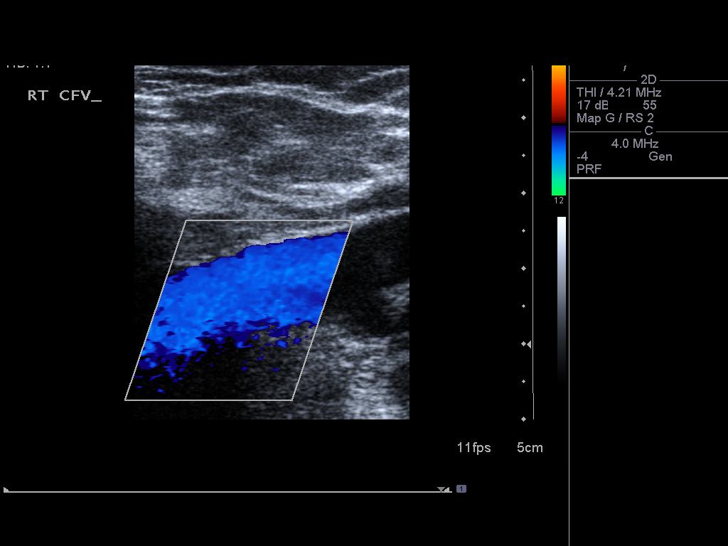
[im 16/53]
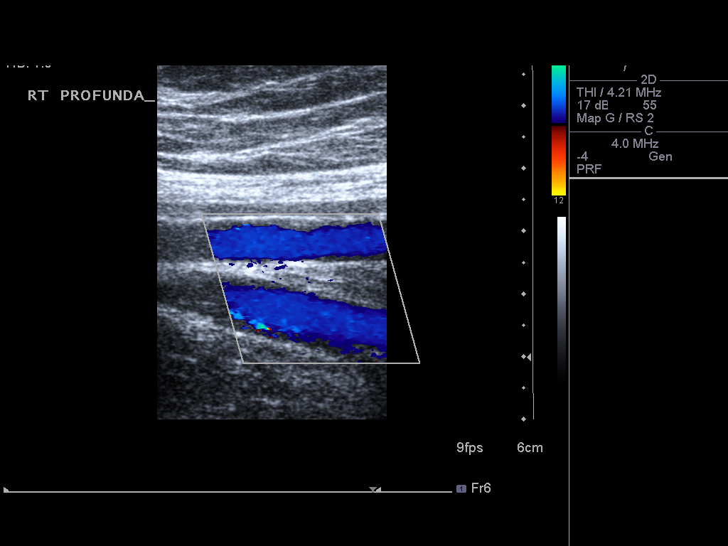
[im 21/53]
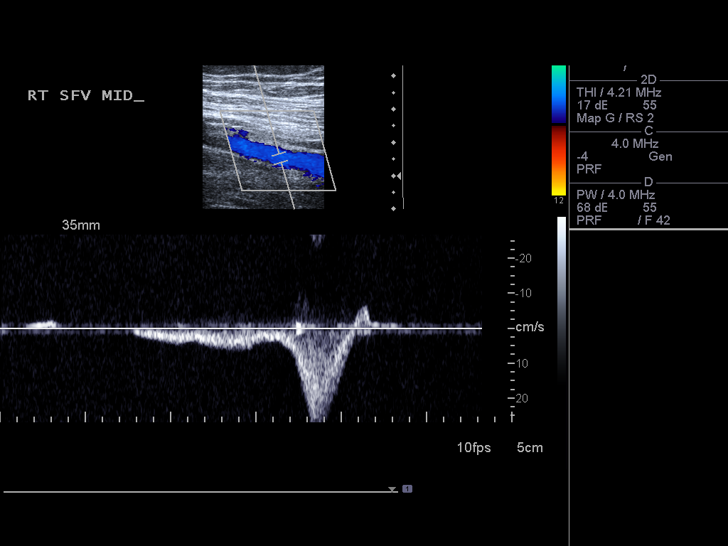
[im 25/53]
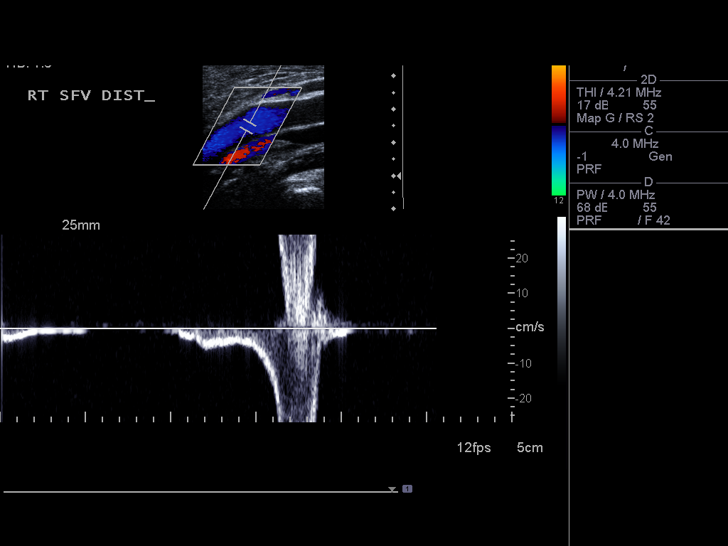
[im 28/53]
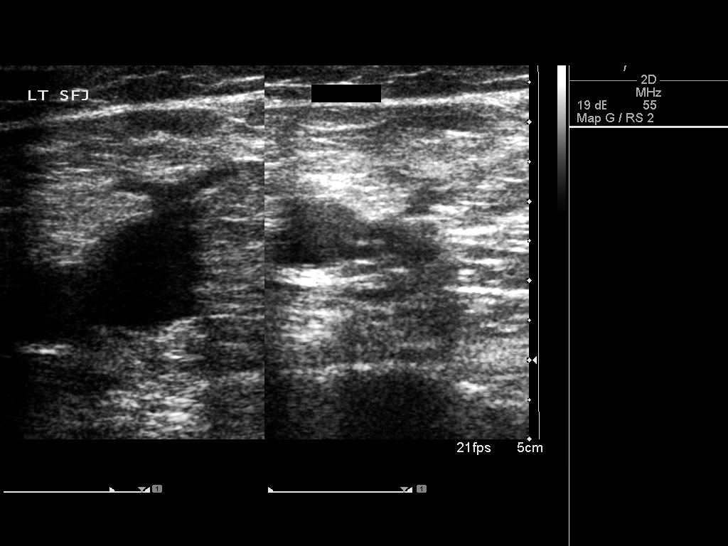
[im 32/53]
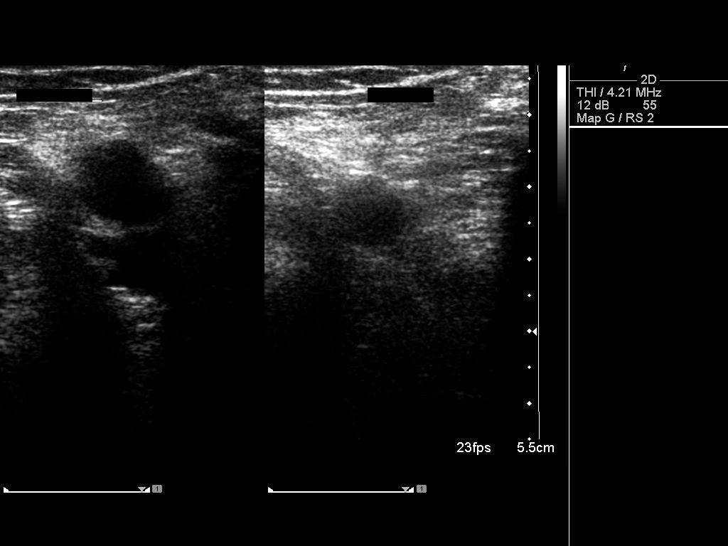
[im 37/53]
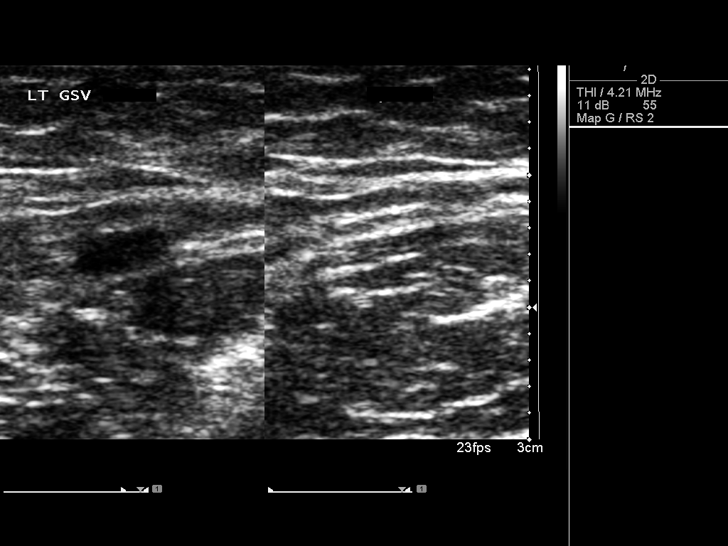
[im 41/53]
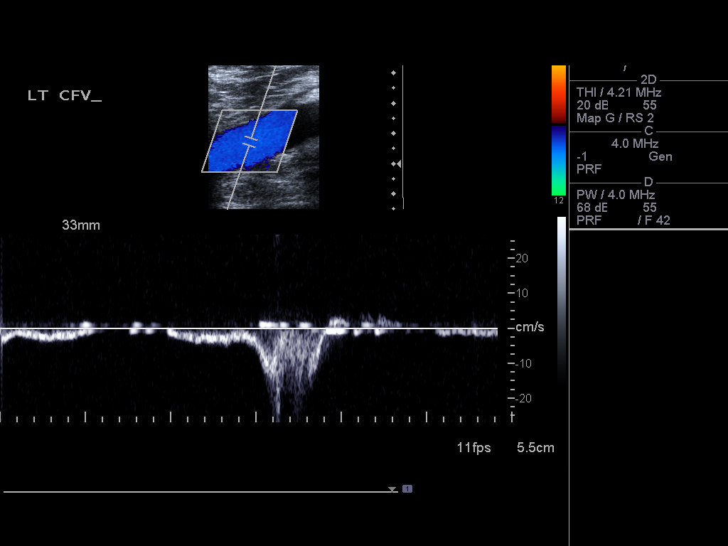
[im 43/53]
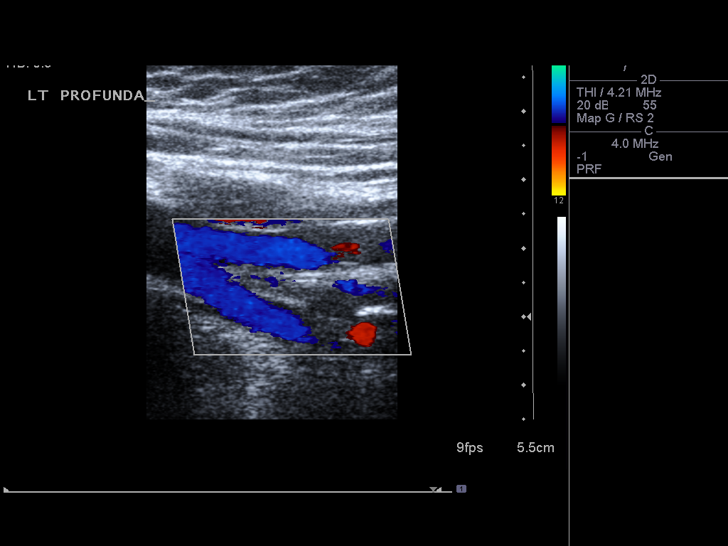
[im 48/53]
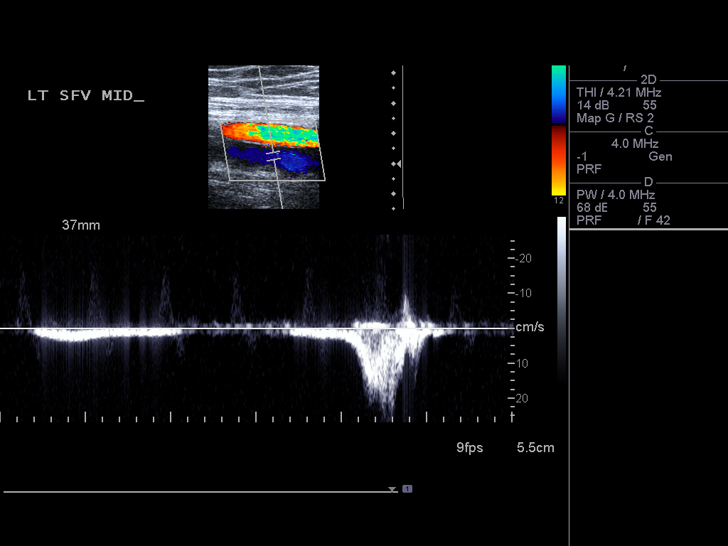
[im 53/53]
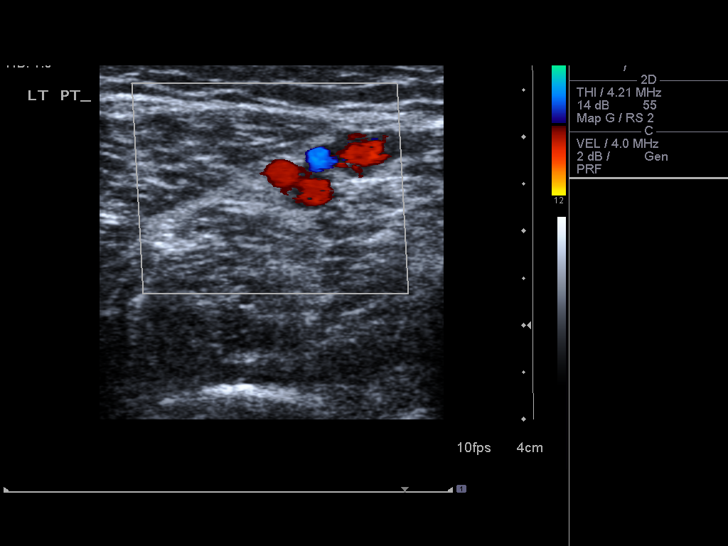

[14 of 24 positions shown; findings below may reference images not displayed]

FINDINGS: The common femoral veins, superficial femoral veins, deep
femoral veins, and popliteal veins show normal patent directional
flow, normal phasicity, normal augmentation, and normal compression
bilaterally.  The posterior tibial veins show normal compression
bilaterally.
IMPRESSION: No evidence for DVT in either lower extremity.

## 2007-08-02 ENCOUNTER — Ambulatory Visit: Payer: Self-pay | Admitting: Oncology

## 2007-08-07 LAB — CBC WITH DIFFERENTIAL/PLATELET
BASO%: 0.4 % (ref 0.0–2.0)
LYMPH%: 15.7 % (ref 14.0–48.0)
MCHC: 34.6 g/dL (ref 32.0–35.9)
MONO#: 0.5 10*3/uL (ref 0.1–0.9)
Platelets: 180 10*3/uL (ref 145–400)
RBC: 5.48 10*6/uL (ref 4.20–5.71)
RDW: 12.6 % (ref 11.2–14.6)
WBC: 7 10*3/uL (ref 4.0–10.0)
lymph#: 1.1 10*3/uL (ref 0.9–3.3)

## 2007-08-07 LAB — LACTATE DEHYDROGENASE: LDH: 116 U/L (ref 94–250)

## 2007-08-07 LAB — COMPREHENSIVE METABOLIC PANEL
ALT: 32 U/L (ref 0–53)
AST: 24 U/L (ref 0–37)
Albumin: 3.7 g/dL (ref 3.5–5.2)
Alkaline Phosphatase: 47 U/L (ref 39–117)
BUN: 15 mg/dL (ref 6–23)
CO2: 28 mEq/L (ref 19–32)
Calcium: 9.1 mg/dL (ref 8.4–10.5)
Chloride: 106 mEq/L (ref 96–112)
Creatinine, Ser: 1.07 mg/dL (ref 0.40–1.50)
Glucose, Bld: 111 mg/dL — ABNORMAL HIGH (ref 70–99)
Potassium: 4.4 mEq/L (ref 3.5–5.3)
Sodium: 140 mEq/L (ref 135–145)
Total Bilirubin: 1 mg/dL (ref 0.3–1.2)
Total Protein: 6.5 g/dL (ref 6.0–8.3)

## 2007-08-07 LAB — MORPHOLOGY
PLT EST: ADEQUATE
RBC Comments: NORMAL

## 2007-08-09 ENCOUNTER — Ambulatory Visit (HOSPITAL_COMMUNITY): Admission: RE | Admit: 2007-08-09 | Discharge: 2007-08-09 | Payer: Self-pay | Admitting: Oncology

## 2007-08-09 IMAGING — CT CT PELVIS W/ CM
2 of 5 series · 16 of 46 positions shown, 18 images · IV contrast (agent unspecified)
Comparison: [DATE]

CT CHEST

CLINICAL DATA: Non-Hodgkin's lymphoma.

CT CHEST, ABDOMEN AND PELVIS WITH CONTRAST
TECHNIQUE: Multidetector CT imaging of the chest, abdomen and
pelvis was performed following the standard protocol during bolus
administration of intravenous contrast.
Contrast: 100 ml [48]

[Series 2: cap 5.0 b40f · axial · 0.80mm/px · z∈[-654,-54]mm · 13 of 136 slices shown, 15 images]
[im 8/136  soft-tissue]
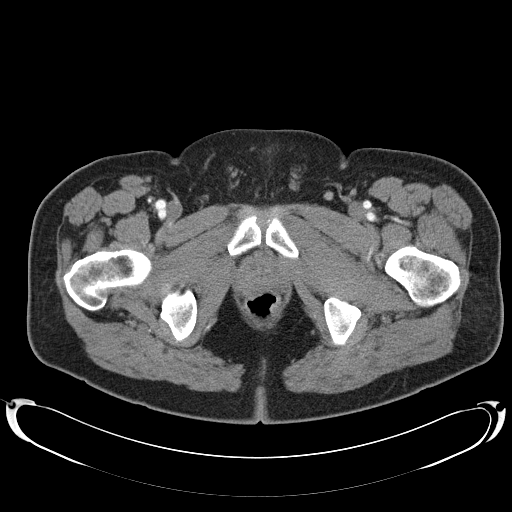
[im 8/136  bone]
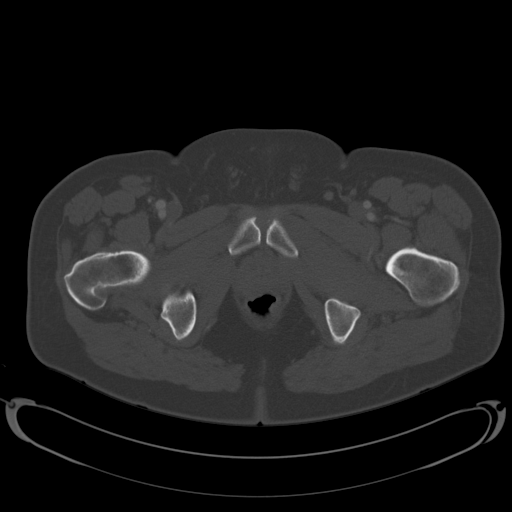
[im 16/136  soft-tissue]
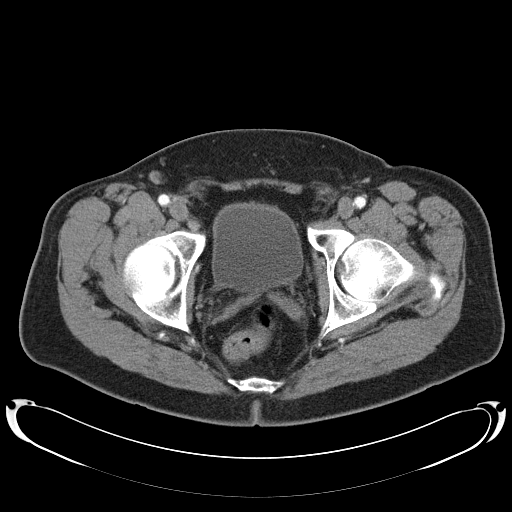
[im 31/136  soft-tissue]
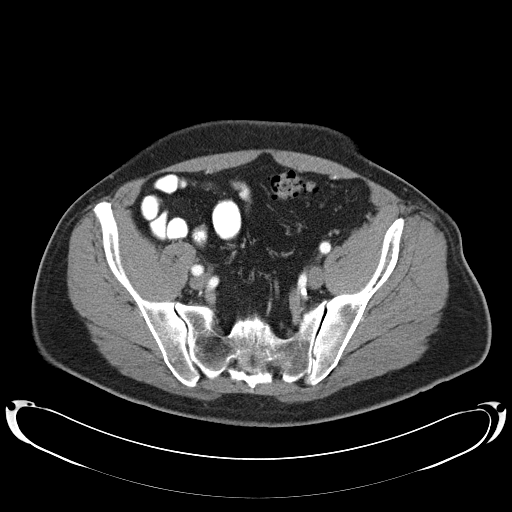
[im 38/136  soft-tissue]
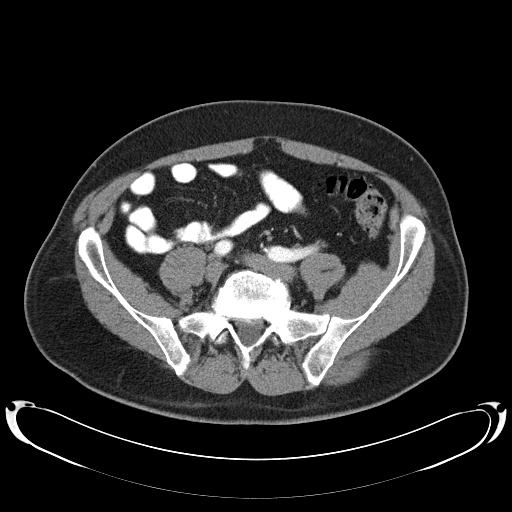
[im 46/136  soft-tissue]
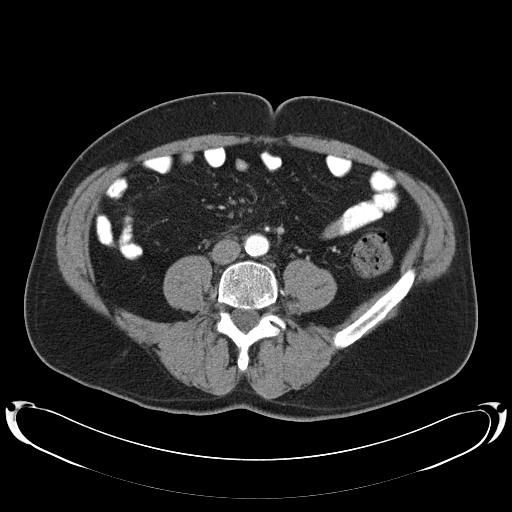
[im 61/136  soft-tissue]
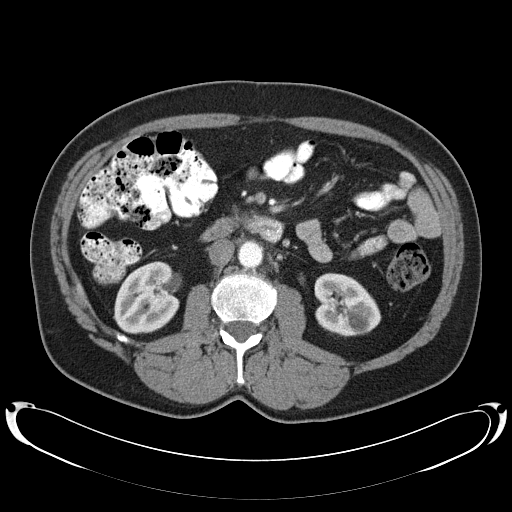
[im 68/136  soft-tissue]
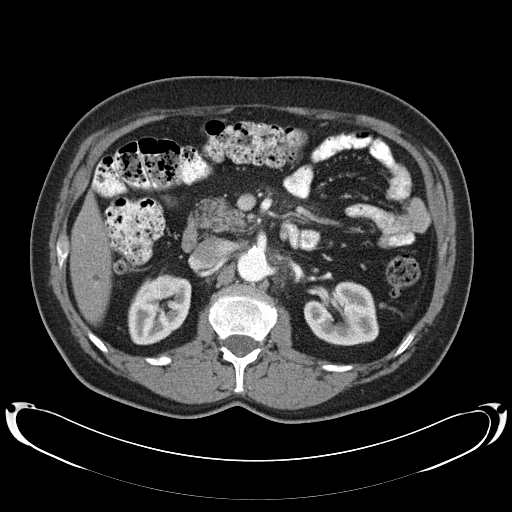
[im 76/136  soft-tissue]
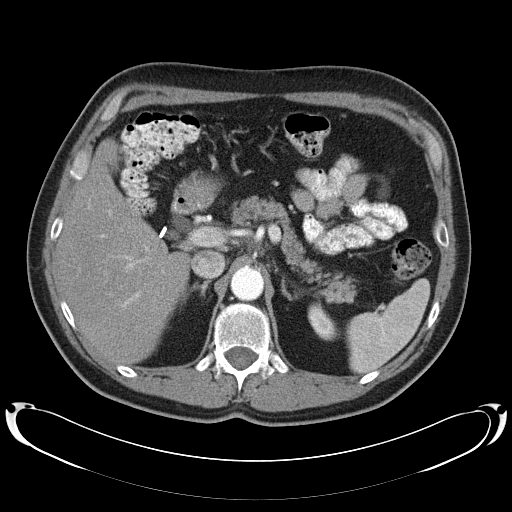
[im 91/136  soft-tissue]
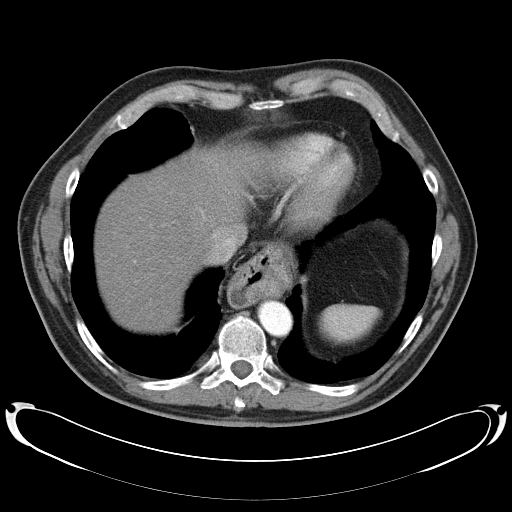
[im 91/136  bone]
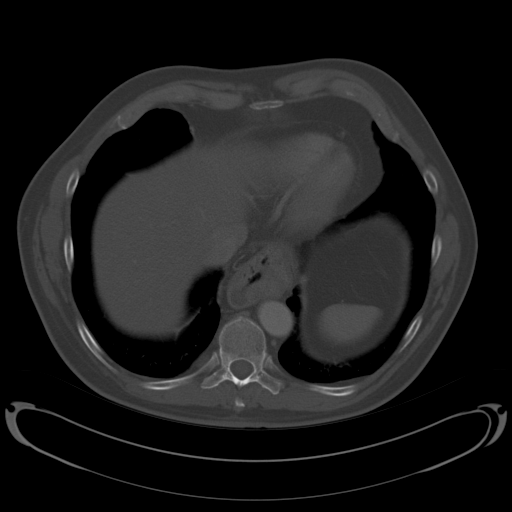
[im 98/136  soft-tissue]
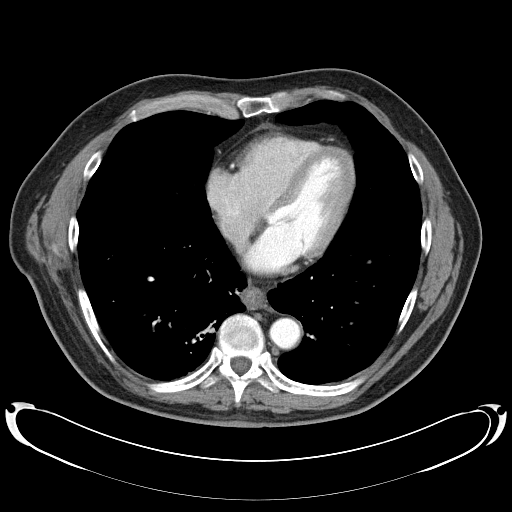
[im 106/136  soft-tissue]
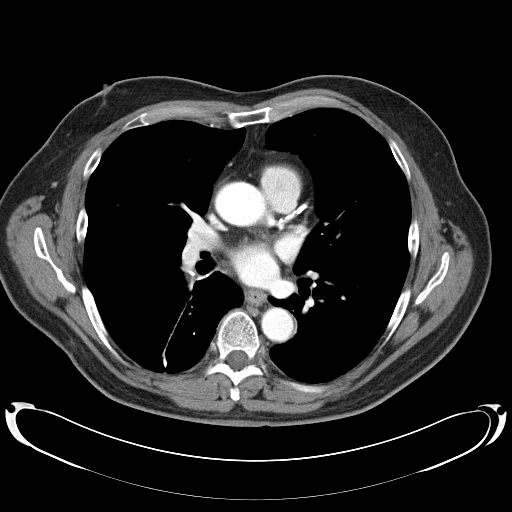
[im 121/136  soft-tissue]
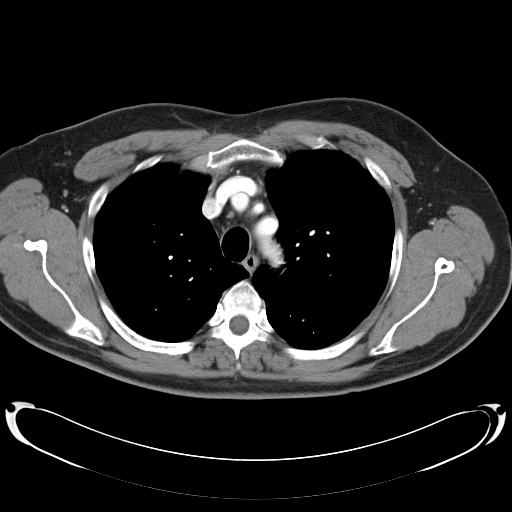
[im 128/136  soft-tissue]
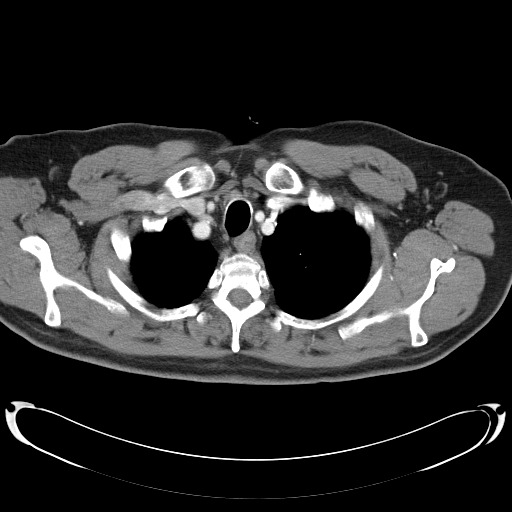

[Series 602: <mpr thick range> · coronal · 1.33mm/px · 3 of 91 slices shown]
[im 31/91  soft-tissue]
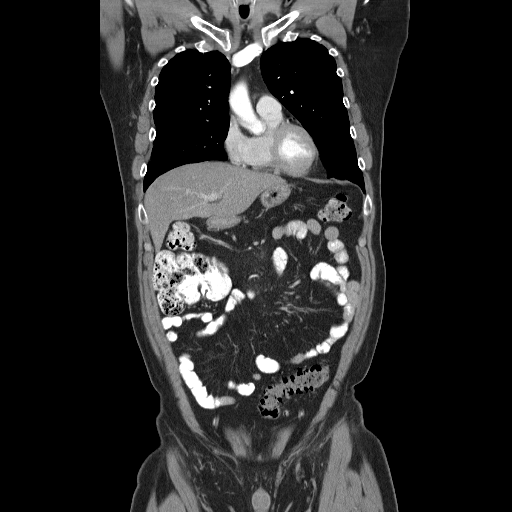
[im 41/91  soft-tissue]
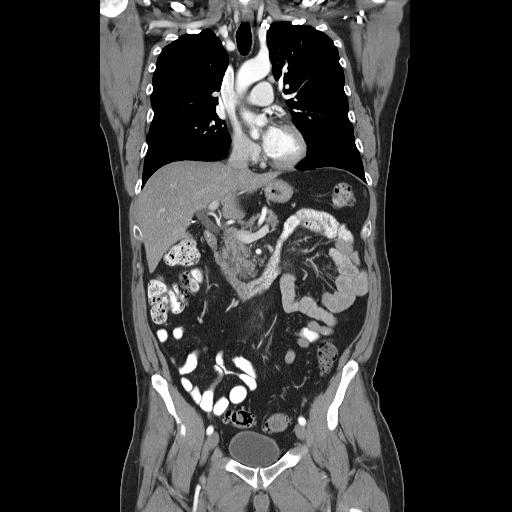
[im 51/91  soft-tissue]
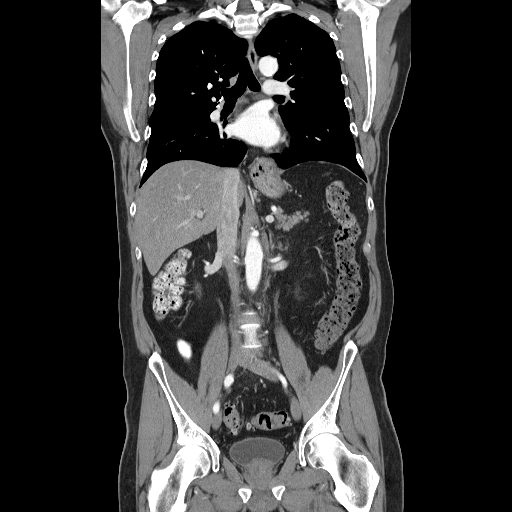

[16 of 46 positions shown; findings below may reference images not displayed]

FINDINGS: Small lymph nodes are seen in the mediastinum, as
before.  These are stable and do not meet CT criteria for
pathologic enlargement.  There is no axillary or hilar
lymphadenopathy.  Surgical suture is seen in the posterior right
hilum.  The heart size is normal.  Coronary artery calcification is
evident.  There is no pericardial or pleural effusion.  Small to
moderate hiatal hernia is evident.

Scarring in the posteromedial right lung is unchanged.  4 mm nodule
in the anterior left upper lobe (image 28) is stable.

Bone windows show no focal lytic or sclerotic osseous lesions.
IMPRESSION: Stable CT scan of the chest.  No evidence for new or progressive
disease.

CT ABDOMEN
FINDINGS: Scattered small hypodensities in the liver are stable
and probably represent tiny cysts.  Spleen is unremarkable.
Duodenum, pancreas, adrenal glands, and right kidney have normal
features.  Small cyst in the left kidney is unchanged.

There is no abdominal aortic aneurysm.  Abnormal soft tissue
attenuation is seen in the para-aortic space, between the celiac
axis in the superior mesenteric artery, extending into the root of
the small bowel mesentery.  No discrete lymph nodes are seen in
this region and these changes are completely stable since the prior
study.

Abdominal bowel loops are normal.  There is no intraperitoneal free
fluid.  A few scattered diverticuli are seen in the abdominal
segments of the colon.
IMPRESSION: Stable exam.  The hazy soft tissue attenuation in the
retroperitoneal space, extending to the root of the mesentery is
stable and remains compatible with post-treatment changes.

CT PELVIS
FINDINGS: There is no intraperitoneal free fluid.  No pelvic
sidewall lymphadenopathy.  Prostate gland is enlarged.  Bladder has
normal imaging features.  There is fairly extensive diverticulosis
in the sigmoid colon without diverticulitis.  Cecal tip is high in
the anterior right abdomen.  The terminal ileum is normal.  The
appendix is not visualized.

Bone windows in the abdomen and pelvis show no worrisome lytic or
sclerotic osseous lesions.
IMPRESSION: Stable CT scan of the pelvis.  There is no evidence for new or
recurrent disease.

## 2008-01-03 ENCOUNTER — Ambulatory Visit: Payer: Self-pay | Admitting: Oncology

## 2008-01-09 ENCOUNTER — Ambulatory Visit (HOSPITAL_COMMUNITY): Admission: RE | Admit: 2008-01-09 | Discharge: 2008-01-09 | Payer: Self-pay | Admitting: Oncology

## 2008-01-09 LAB — CBC WITH DIFFERENTIAL/PLATELET
Basophils Absolute: 0 10*3/uL (ref 0.0–0.1)
Eosinophils Absolute: 0.2 10*3/uL (ref 0.0–0.5)
HCT: 51.2 % — ABNORMAL HIGH (ref 38.7–49.9)
HGB: 17.9 g/dL — ABNORMAL HIGH (ref 13.0–17.1)
MCH: 31.3 pg (ref 28.0–33.4)
MONO#: 0.6 10*3/uL (ref 0.1–0.9)
NEUT#: 5 10*3/uL (ref 1.5–6.5)
NEUT%: 71.8 % (ref 40.0–75.0)
WBC: 6.9 10*3/uL (ref 4.0–10.0)
lymph#: 1.1 10*3/uL (ref 0.9–3.3)

## 2008-01-09 LAB — ERYTHROCYTE SEDIMENTATION RATE: Sed Rate: 1 mm/hr (ref 0–20)

## 2008-01-09 LAB — COMPREHENSIVE METABOLIC PANEL
AST: 28 U/L (ref 0–37)
Albumin: 4 g/dL (ref 3.5–5.2)
Alkaline Phosphatase: 56 U/L (ref 39–117)
Potassium: 3.7 mEq/L (ref 3.5–5.3)
Sodium: 139 mEq/L (ref 135–145)
Total Bilirubin: 1.1 mg/dL (ref 0.3–1.2)
Total Protein: 6.8 g/dL (ref 6.0–8.3)

## 2008-01-09 LAB — MORPHOLOGY: PLT EST: ADEQUATE

## 2008-01-09 IMAGING — CT CT CHEST W/ CM
2 of 5 series · 16 of 46 positions shown, 18 images · IV contrast (agent unspecified)
Comparison: [DATE]

CT CHEST

CLINICAL DATA: Lymphoma

CT CHEST, ABDOMEN AND PELVIS WITH CONTRAST
TECHNIQUE: Multidetector CT imaging of the chest, abdomen and
pelvis was performed following the standard protocol during bolus
administration of intravenous contrast.
Contrast: 100 ml of [6X]

[Series 2: cap 5.0 b40f · axial · 0.83mm/px · z∈[-666,-61]mm · 13 of 137 slices shown, 15 images]
[im 8/137  soft-tissue]
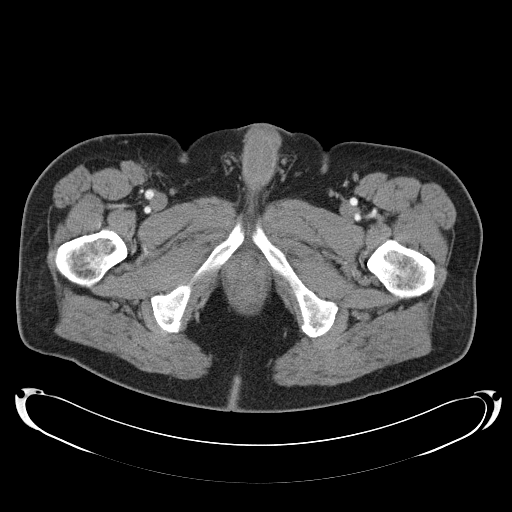
[im 8/137  bone]
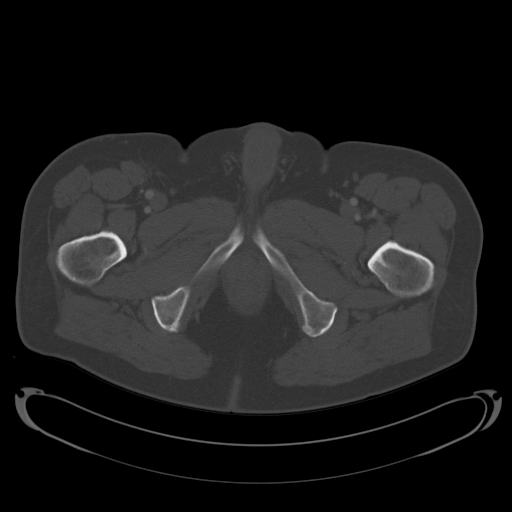
[im 16/137  soft-tissue]
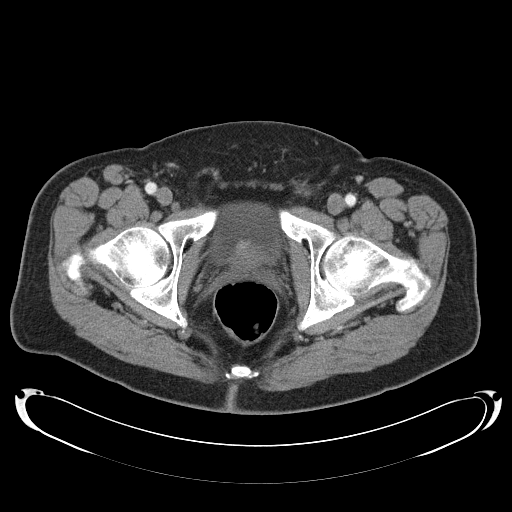
[im 31/137  soft-tissue]
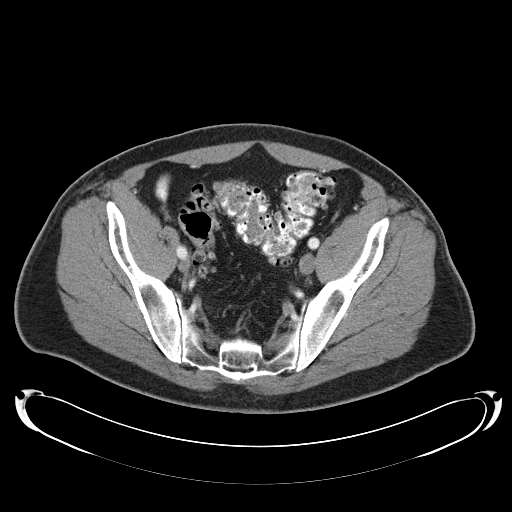
[im 38/137  soft-tissue]
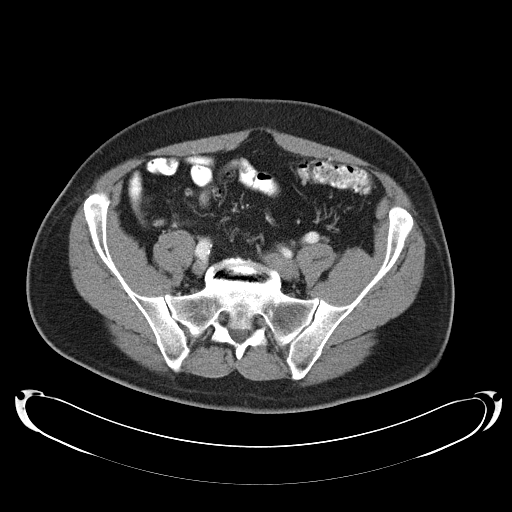
[im 46/137  soft-tissue]
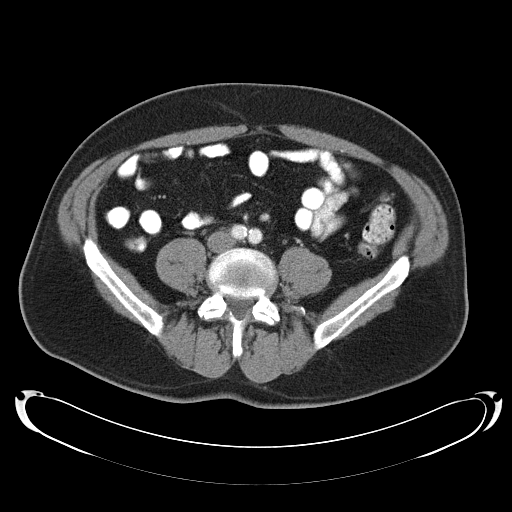
[im 61/137  soft-tissue]
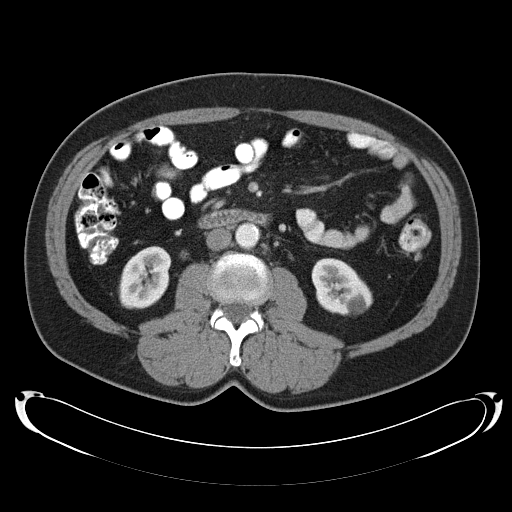
[im 69/137  soft-tissue]
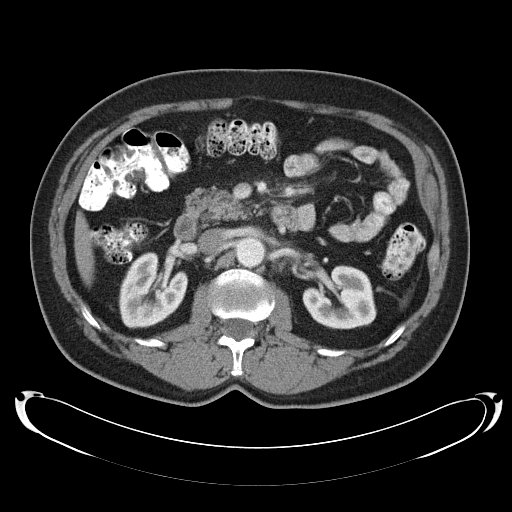
[im 76/137  soft-tissue]
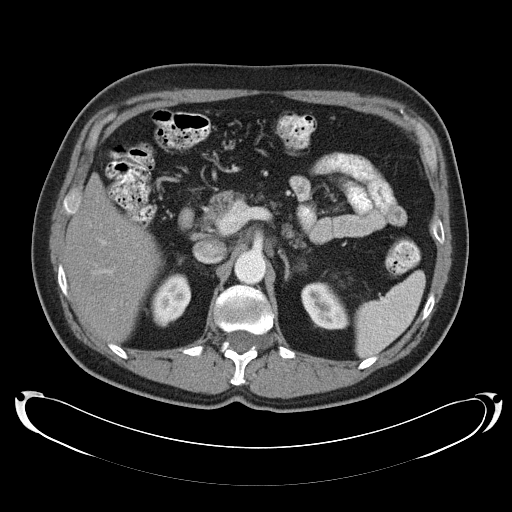
[im 91/137  soft-tissue]
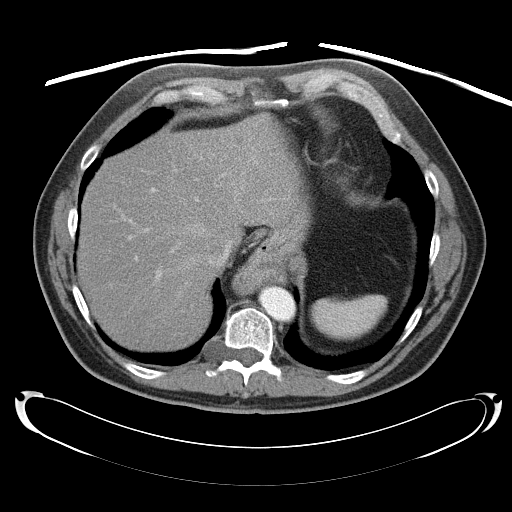
[im 91/137  bone]
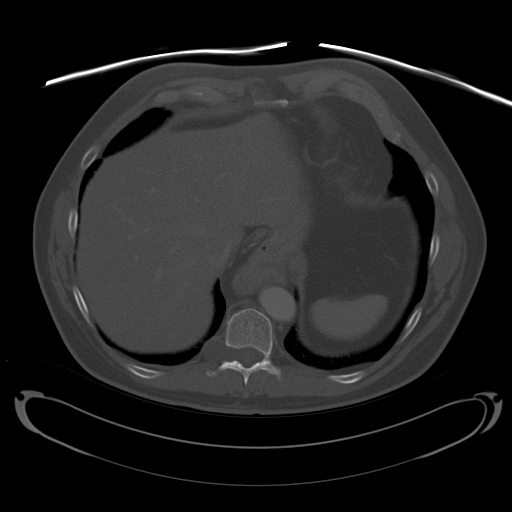
[im 99/137  soft-tissue]
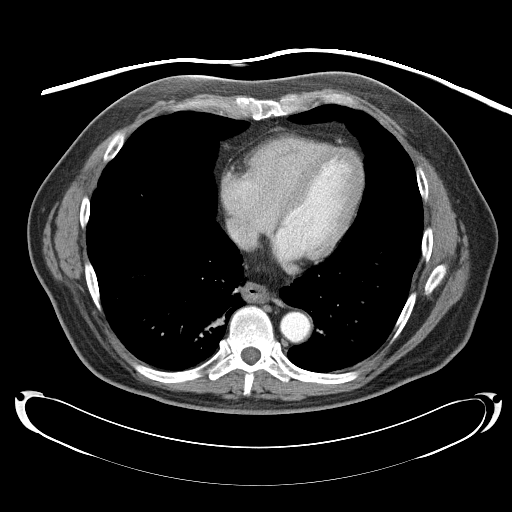
[im 106/137  soft-tissue]
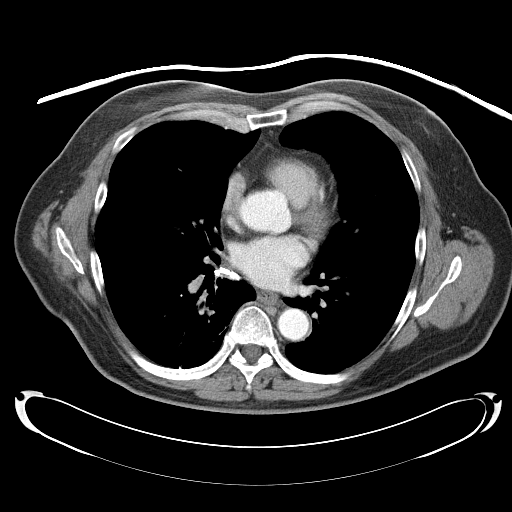
[im 121/137  soft-tissue]
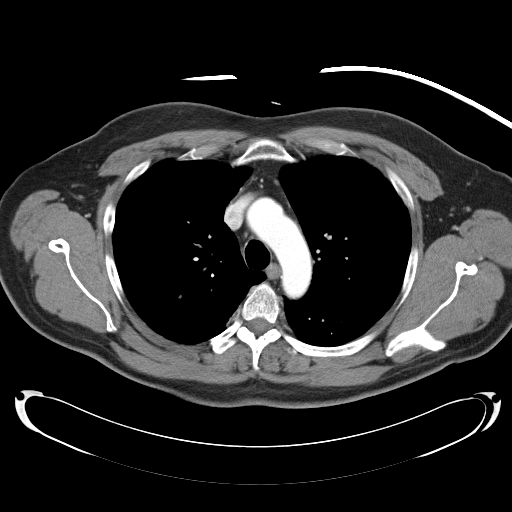
[im 129/137  soft-tissue]
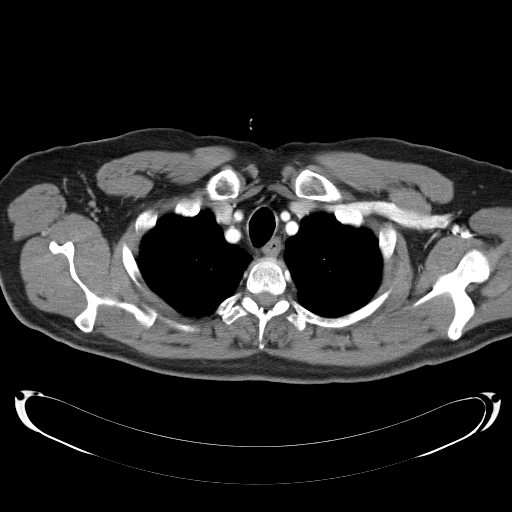

[Series 602: <mpr thick range> · coronal · 1.33mm/px · 3 of 89 slices shown]
[im 30/89  soft-tissue]
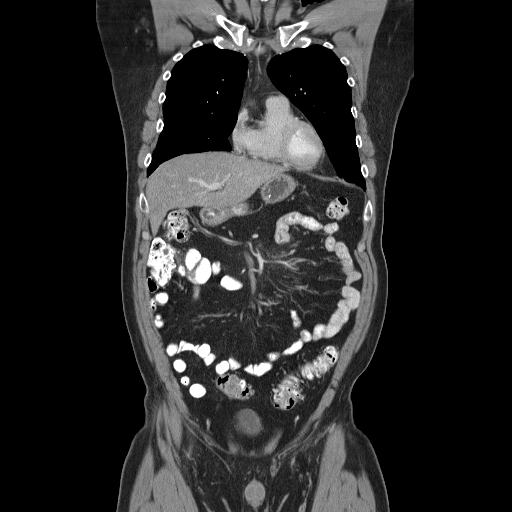
[im 40/89  soft-tissue]
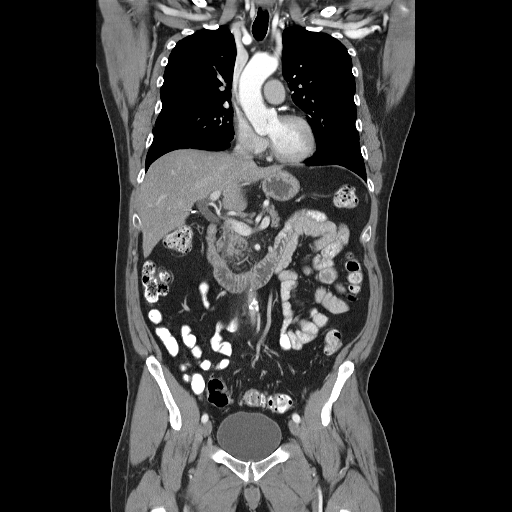
[im 49/89  soft-tissue]
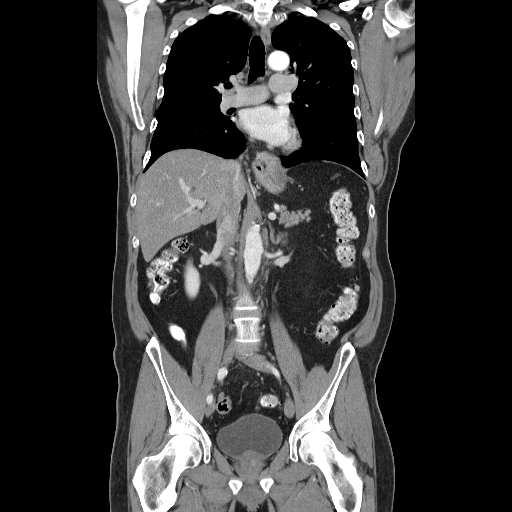

[16 of 46 positions shown; findings below may reference images not displayed]

FINDINGS: The chest wall is unremarkable and stable.  No
supraclavicular or axillary adenopathy.  The bony thorax is intact.
No spinal canal compromise.

The heart is normal in size.  No pericardial effusion.  Stable
dense coronary artery calcifications.  No change and mediastinal
and hilar lymph nodes.  Stable surgical changes from a partial
right lung resection.  The aorta is normal in caliber.  No
dissection.  The esophagus is grossly normal.  There is a moderate
to large hiatal hernia.

Examination of the lung parenchyma demonstrates stable changes of
COPD.  Right lower lobe scarring changes persist.  No worrisome
pulmonary nodules or masses.  No acute pulmonary findings.  No
pleural effusions.
IMPRESSION: 1.  No adenopathy in the chest.  Stable mediastinal and hilar lymph
nodes.
2.  Stable surgical changes in the right lung.
3.  Dense coronary artery calcifications.
4.  Moderate to large sized hiatal hernia, stable.
5.  Stable changes of COPD.

CT ABDOMEN
FINDINGS: There is diffuse fatty infiltration of the liver with
stable small hepatic cysts.  The the patient has had a
cholecystectomy.  Stable mild common bile duct dilatation.  The
spleen is normal in size.  The pancreas, adrenal glands and kidneys
are unremarkable and unchanged.  A left renal cyst is again
demonstrated.

The stomach, duodenum, small bowel and colon demonstrate no
significant findings.  No enlarged mesenteric or retroperitoneal
lymph nodes.  No change and hazy opacity in the region of the
celiac axis and retroperitoneal bright related to treated disease.

The aorta is normal in caliber.  No dissection.  Stable
atherosclerotic calcifications.

No significant bony findings.
IMPRESSION: 1.  No CT findings for recurrent lymphoma in the abdomen.  Stable
areas of treated disease.
2.  Diffuse fatty infiltration of the liver with small stable
hepatic cysts.

CT PELVIS
FINDINGS: The rectum, sigmoid colon and visualized small bowel
loops are unremarkable.  There is diffuse diverticulosis of the
sigmoid colon.

The bladder appears normal.  Mild median lobe hypertrophy of the
prostate gland is noted.  No pelvic masses, adenopathy or free
pelvic fluid collections.  The bony pelvis is intact.  No inguinal
adenopathy.
IMPRESSION: Unremarkable and stable CT appearance of the pelvis.  No masses or
adenopathy.

## 2008-02-14 ENCOUNTER — Ambulatory Visit: Payer: Self-pay | Admitting: Oncology

## 2008-06-06 ENCOUNTER — Ambulatory Visit: Payer: Self-pay | Admitting: Oncology

## 2008-06-11 ENCOUNTER — Ambulatory Visit (HOSPITAL_COMMUNITY): Admission: RE | Admit: 2008-06-11 | Discharge: 2008-06-11 | Payer: Self-pay | Admitting: Oncology

## 2008-06-11 LAB — CBC WITH DIFFERENTIAL/PLATELET
BASO%: 0.2 % (ref 0.0–2.0)
EOS%: 2.5 % (ref 0.0–7.0)
HGB: 17 g/dL (ref 13.0–17.1)
MCH: 30.9 pg (ref 27.2–33.4)
MCHC: 34.3 g/dL (ref 32.0–36.0)
MCV: 90.1 fL (ref 79.3–98.0)
MONO%: 8.2 % (ref 0.0–14.0)
RBC: 5.49 10*6/uL (ref 4.20–5.82)
RDW: 12.7 % (ref 11.0–14.6)
lymph#: 1.1 10*3/uL (ref 0.9–3.3)

## 2008-06-11 LAB — COMPREHENSIVE METABOLIC PANEL
AST: 23 U/L (ref 0–37)
Albumin: 3.9 g/dL (ref 3.5–5.2)
Alkaline Phosphatase: 61 U/L (ref 39–117)
BUN: 18 mg/dL (ref 6–23)
Creatinine, Ser: 1.07 mg/dL (ref 0.40–1.50)
Glucose, Bld: 114 mg/dL — ABNORMAL HIGH (ref 70–99)
Potassium: 4.3 mEq/L (ref 3.5–5.3)

## 2008-06-11 LAB — LACTATE DEHYDROGENASE: LDH: 120 U/L (ref 94–250)

## 2008-06-11 LAB — ERYTHROCYTE SEDIMENTATION RATE: Sed Rate: 0 mm/hr (ref 0–20)

## 2008-06-11 LAB — MORPHOLOGY

## 2008-06-11 IMAGING — CT CT CHEST W/ CM
2 of 5 series · 17 of 46 positions shown, 19 images · IV contrast (agent unspecified)
Comparison: [DATE].

CT CHEST

CLINICAL DATA: Non-Hodgkins lymphoma.

CT CHEST, ABDOMEN AND PELVIS WITH CONTRAST
TECHNIQUE: Multidetector CT imaging of the chest, abdomen and
pelvis was performed following the standard protocol during bolus
administration of intravenous contrast.
Contrast: 100 ml [64]

[Series 2: cap with st · axial · 0.81mm/px · z∈[-660,-60]mm · 14 of 136 slices shown, 16 images]
[im 8/136  soft-tissue]
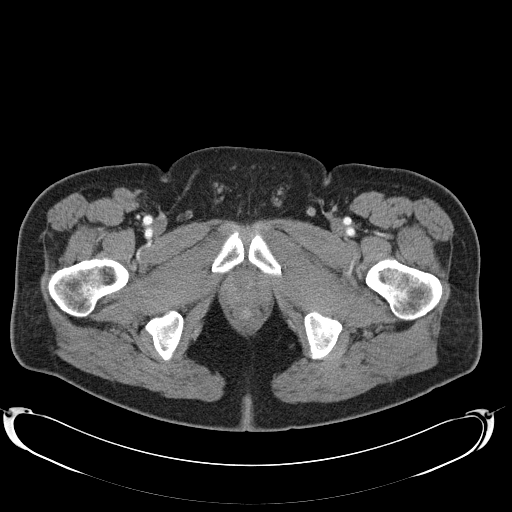
[im 8/136  bone]
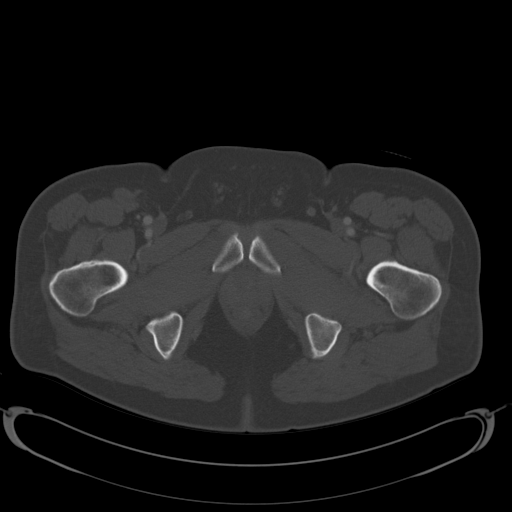
[im 16/136  soft-tissue]
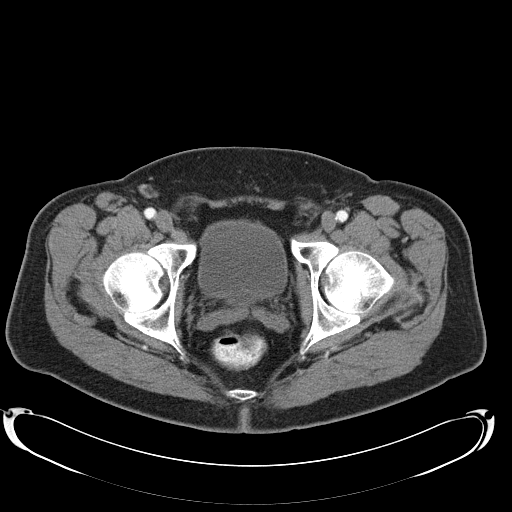
[im 31/136  soft-tissue]
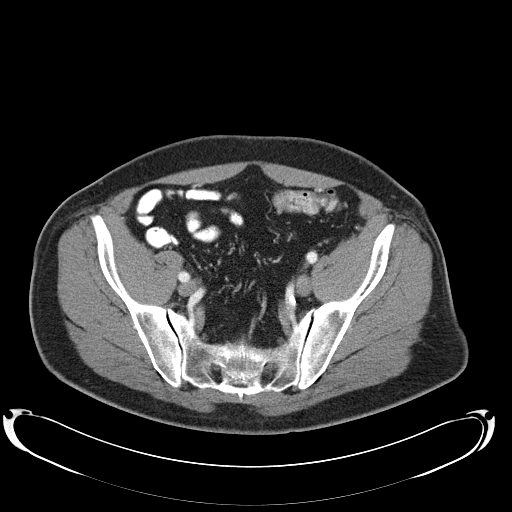
[im 38/136  soft-tissue]
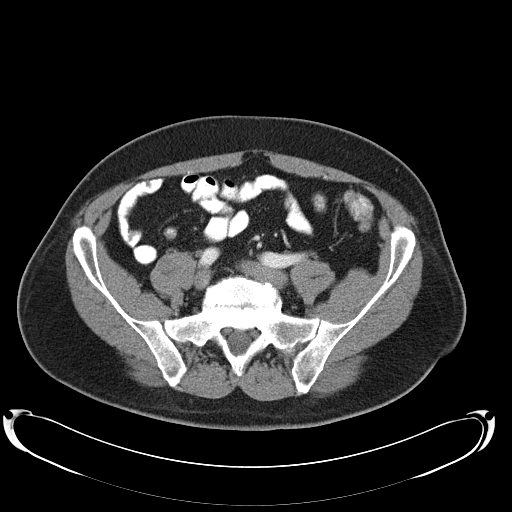
[im 46/136  soft-tissue]
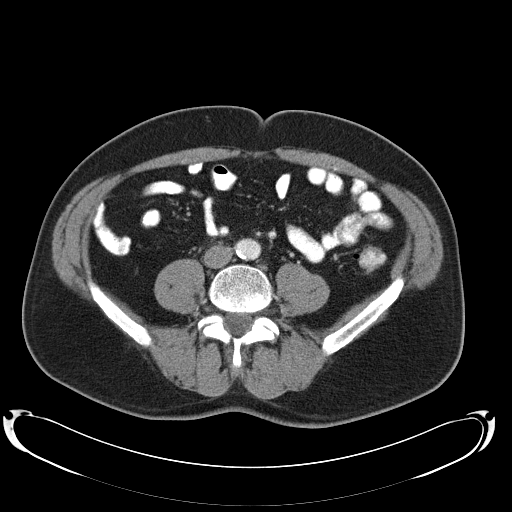
[im 53/136  soft-tissue]
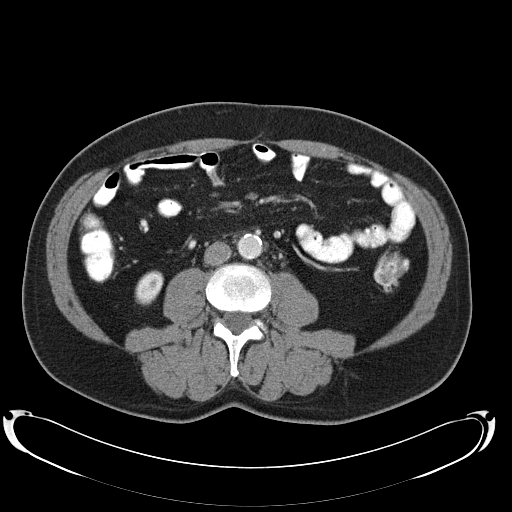
[im 61/136  soft-tissue]
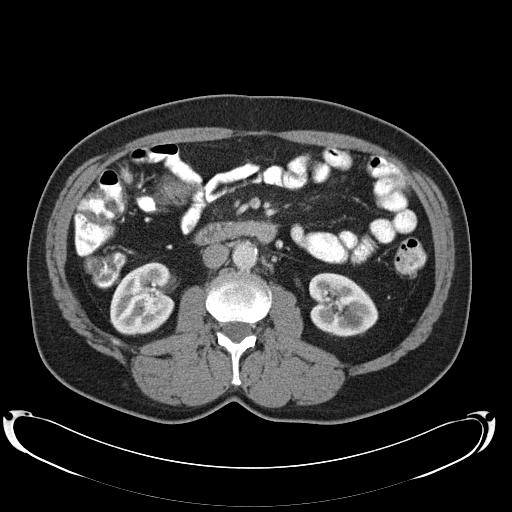
[im 76/136  soft-tissue]
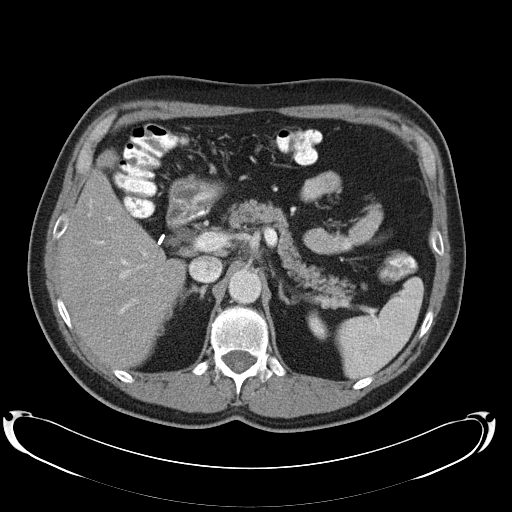
[im 83/136  soft-tissue]
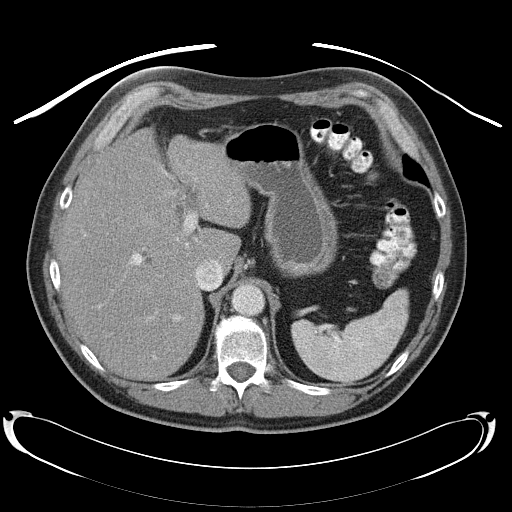
[im 83/136  bone]
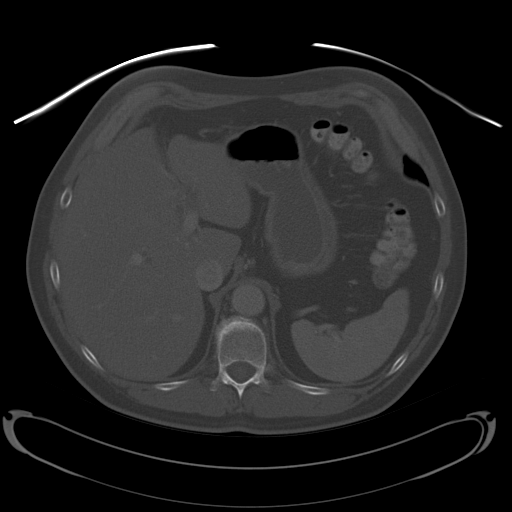
[im 91/136  soft-tissue]
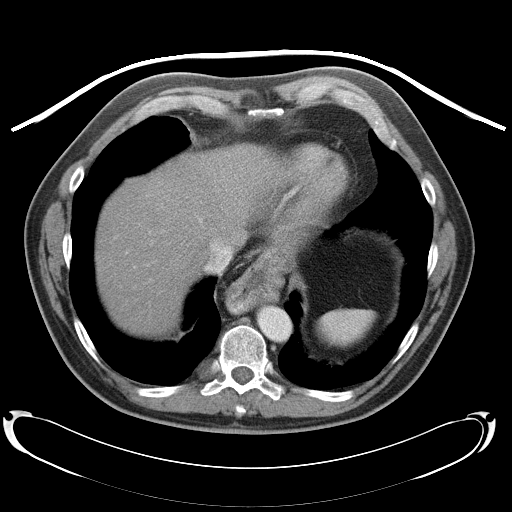
[im 98/136  soft-tissue]
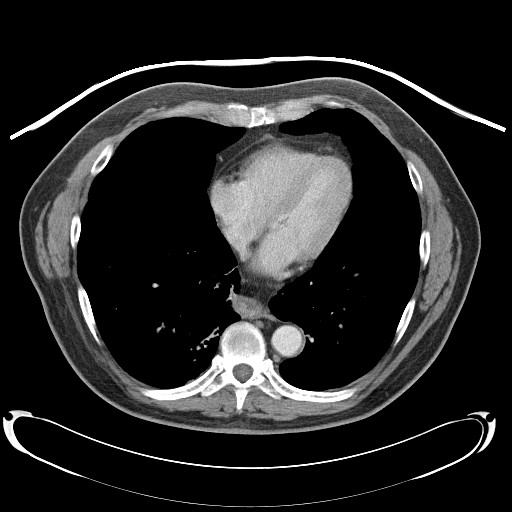
[im 106/136  soft-tissue]
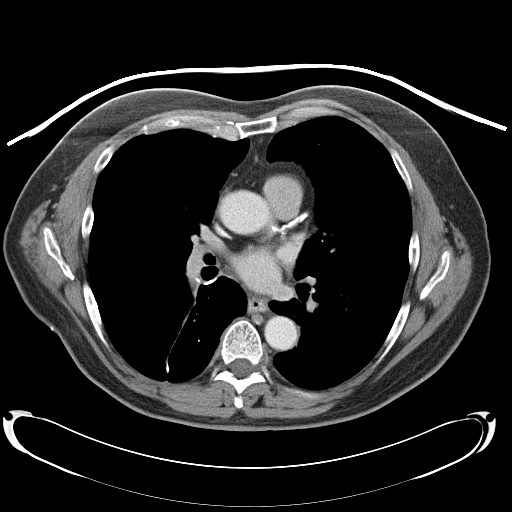
[im 121/136  soft-tissue]
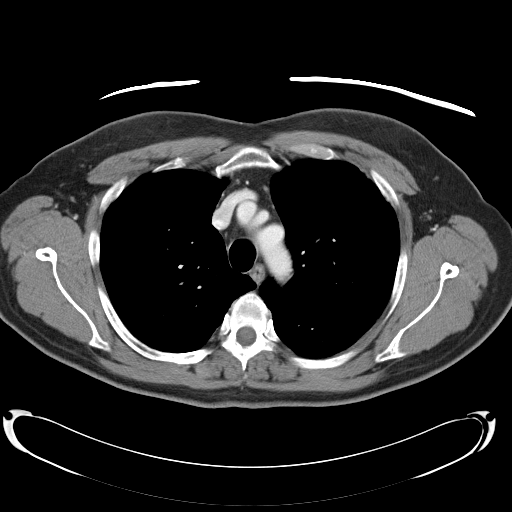
[im 128/136  soft-tissue]
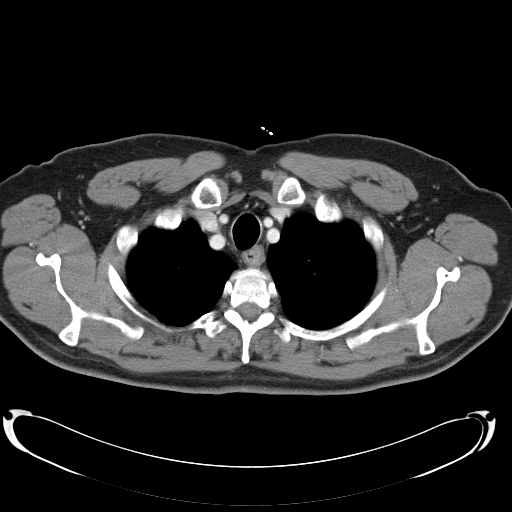

[Series 602: <mpr thick range> · coronal · 1.33mm/px · 3 of 99 slices shown]
[im 33/99  soft-tissue]
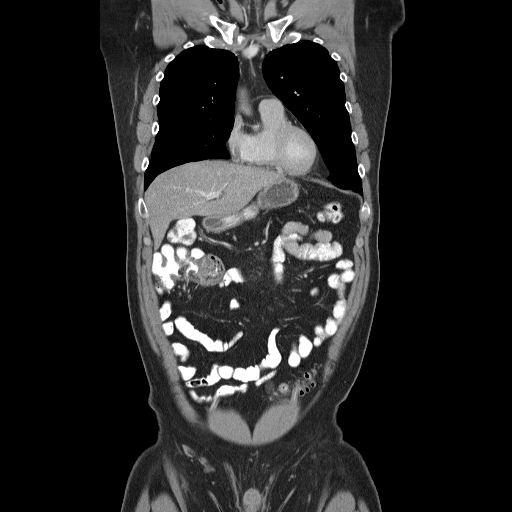
[im 44/99  soft-tissue]
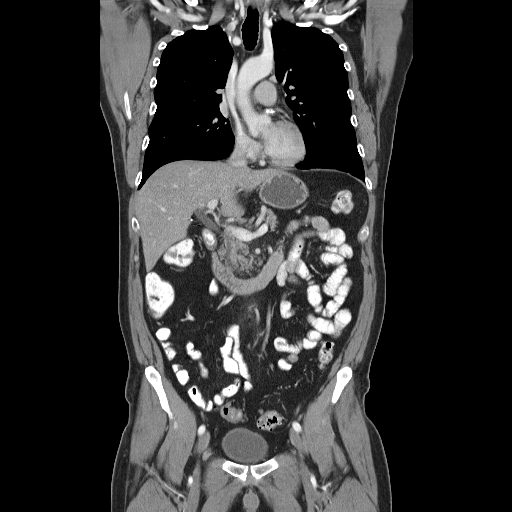
[im 55/99  soft-tissue]
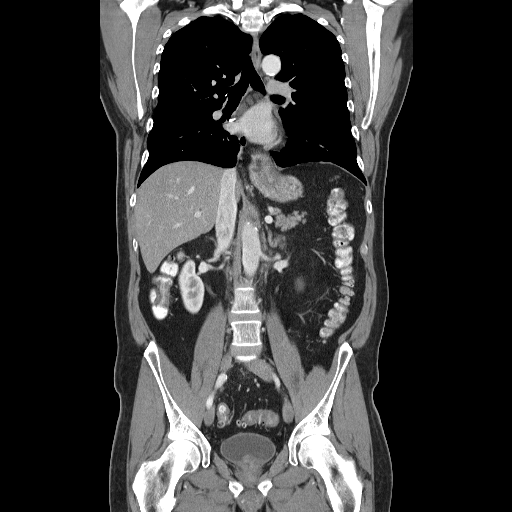

[17 of 46 positions shown; findings below may reference images not displayed]

FINDINGS: Mild bullous changes in both lungs, greater on the left.
No lung masses or enlarged lymph nodes.  Unremarkable bones.
Moderate-sized hiatal hernia.  Stable postsurgical changes on the
right.
IMPRESSION: 1.  No adenopathy.
2.  Stable changes of COPD.
3.  Moderate sized hiatal hernia.

CT ABDOMEN
FINDINGS: No significant change in the a left renal cyst and small
liver cysts.  Stable diffuse fatty infiltration of the liver.
Stable ill-defined density at the celiac axis, compatible with
treated adenopathy.  No discrete enlarged lymph nodes seen at this
time.  Unremarkable spleen, pancreas, adrenal glands and right
kidney.  Cholecystectomy clips.  No intestinal abnormalities.
Lumbar spine degenerative changes.
IMPRESSION: 1.  No adenopathy.
2.  Stable fatty infiltration of the liver.

CT PELVIS
FINDINGS: Multiple sigmoid colon diverticula are again
demonstrated.  Again demonstrated is enlargement of the prostate
gland with a prominent median lobe.  No masses or enlarged lymph
nodes.  Unremarkable bones.
IMPRESSION: 1.  No adenopathy.
2.  Stable extensive sigmoid diverticulosis.
3.  Stable prostatic hypertrophy.

## 2008-08-14 ENCOUNTER — Ambulatory Visit: Payer: Self-pay | Admitting: Oncology

## 2008-08-30 LAB — CBC WITH DIFFERENTIAL/PLATELET
BASO%: 0.3 % (ref 0.0–2.0)
Basophils Absolute: 0 10*3/uL (ref 0.0–0.1)
EOS%: 2.8 % (ref 0.0–7.0)
HCT: 47.7 % (ref 38.4–49.9)
HGB: 16.4 g/dL (ref 13.0–17.1)
MCH: 31 pg (ref 27.2–33.4)
MCHC: 34.4 g/dL (ref 32.0–36.0)
MCV: 90.2 fL (ref 79.3–98.0)
MONO%: 9.5 % (ref 0.0–14.0)
NEUT%: 70.1 % (ref 39.0–75.0)
lymph#: 1.1 10*3/uL (ref 0.9–3.3)

## 2008-10-11 ENCOUNTER — Ambulatory Visit: Payer: Self-pay | Admitting: Oncology

## 2008-10-15 ENCOUNTER — Ambulatory Visit (HOSPITAL_COMMUNITY): Admission: RE | Admit: 2008-10-15 | Discharge: 2008-10-15 | Payer: Self-pay | Admitting: Oncology

## 2008-10-15 LAB — CBC WITH DIFFERENTIAL/PLATELET
BASO%: 0.2 % (ref 0.0–2.0)
LYMPH%: 15.2 % (ref 14.0–49.0)
MCHC: 34.4 g/dL (ref 32.0–36.0)
MONO#: 0.6 10*3/uL (ref 0.1–0.9)
RBC: 5.34 10*6/uL (ref 4.20–5.82)
RDW: 12.4 % (ref 11.0–14.6)
WBC: 7.4 10*3/uL (ref 4.0–10.3)
lymph#: 1.1 10*3/uL (ref 0.9–3.3)

## 2008-10-15 LAB — MORPHOLOGY
PLT EST: ADEQUATE
RBC Comments: NORMAL

## 2008-10-15 LAB — COMPREHENSIVE METABOLIC PANEL
ALT: 28 U/L (ref 0–53)
AST: 25 U/L (ref 0–37)
Alkaline Phosphatase: 63 U/L (ref 39–117)
Creatinine, Ser: 1.05 mg/dL (ref 0.40–1.50)
Total Bilirubin: 1.1 mg/dL (ref 0.3–1.2)

## 2008-10-15 LAB — ERYTHROCYTE SEDIMENTATION RATE: Sed Rate: 1 mm/hr (ref 0–20)

## 2008-10-15 IMAGING — CT CT CHEST W/ CM
2 of 5 series · 16 of 46 positions shown, 18 images · IV contrast (agent unspecified)
Comparison: [DATE]

CT CHEST

CLINICAL DATA: Lymphoma

CT CHEST, ABDOMEN AND PELVIS WITH CONTRAST
TECHNIQUE: Multidetector CT imaging of the chest, abdomen and
pelvis was performed following the standard protocol during bolus
administration of intravenous contrast.
Contrast: 100 ml of omni 300

[Series 2: cap with st · axial · 0.87mm/px · z∈[-664,-54]mm · 13 of 138 slices shown, 15 images]
[im 8/138  soft-tissue]
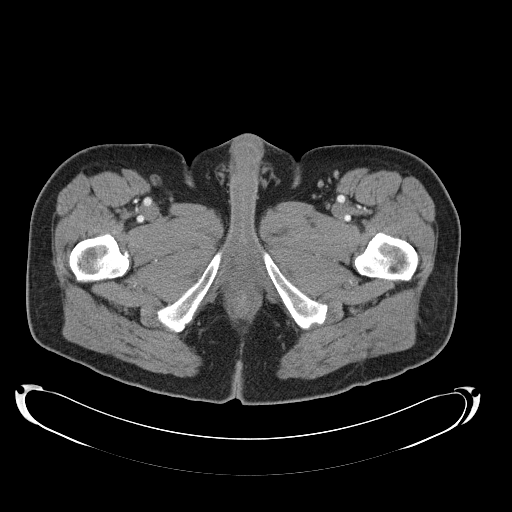
[im 8/138  bone]
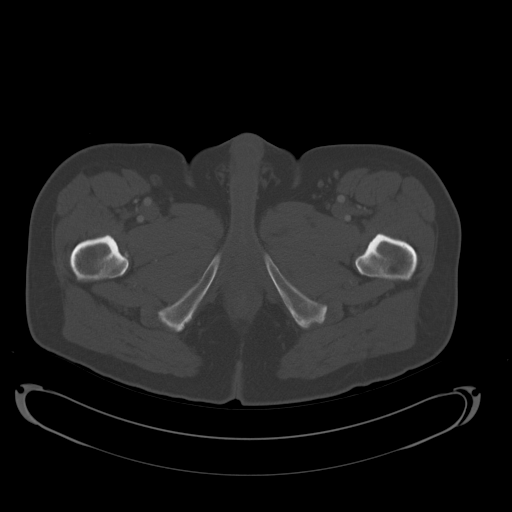
[im 16/138  soft-tissue]
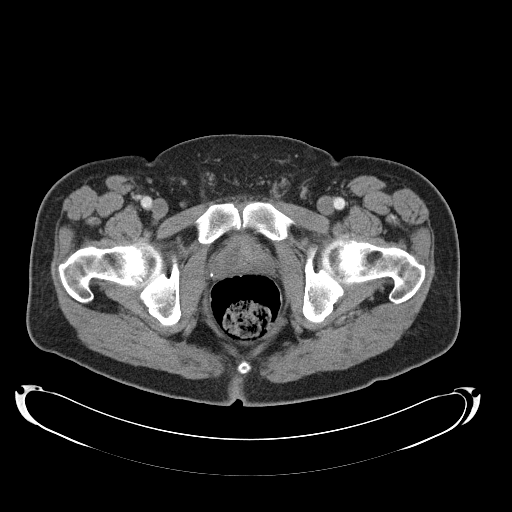
[im 31/138  soft-tissue]
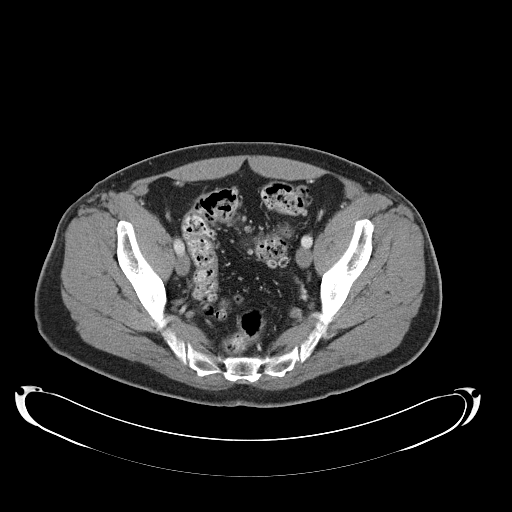
[im 39/138  soft-tissue]
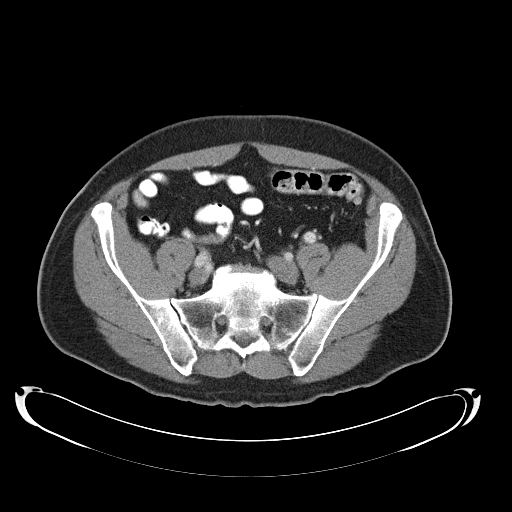
[im 46/138  soft-tissue]
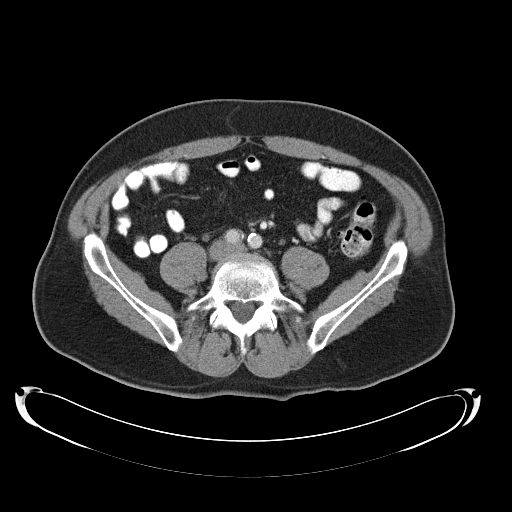
[im 61/138  soft-tissue]
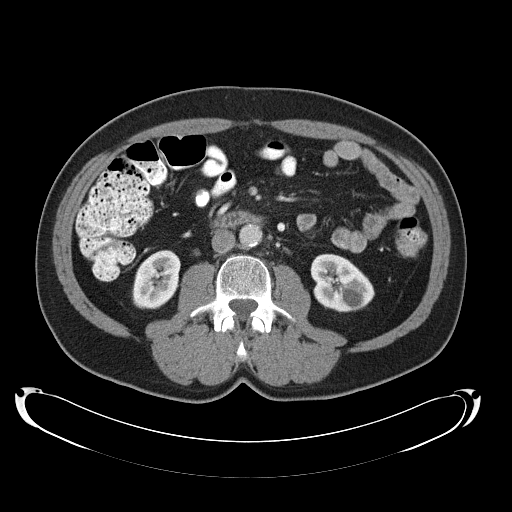
[im 69/138  soft-tissue]
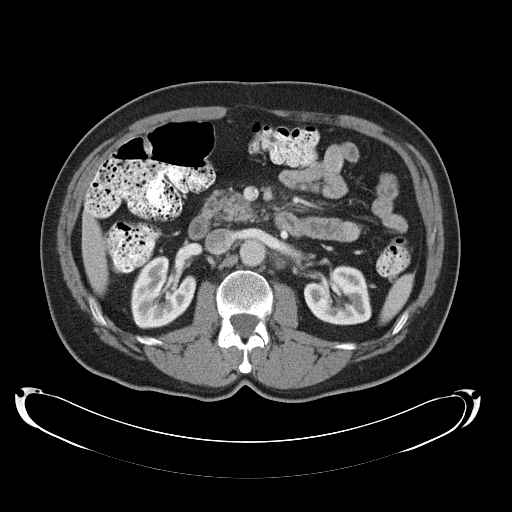
[im 77/138  soft-tissue]
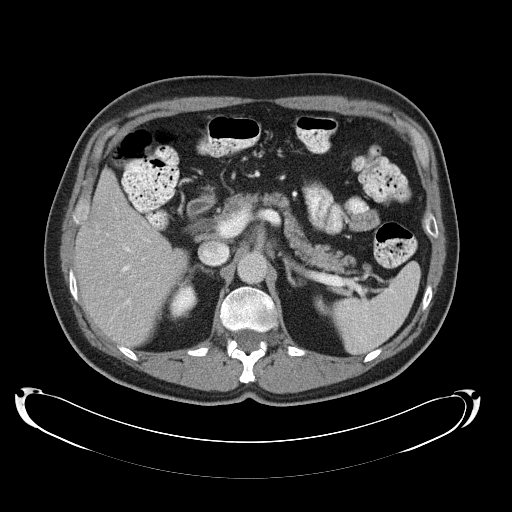
[im 92/138  soft-tissue]
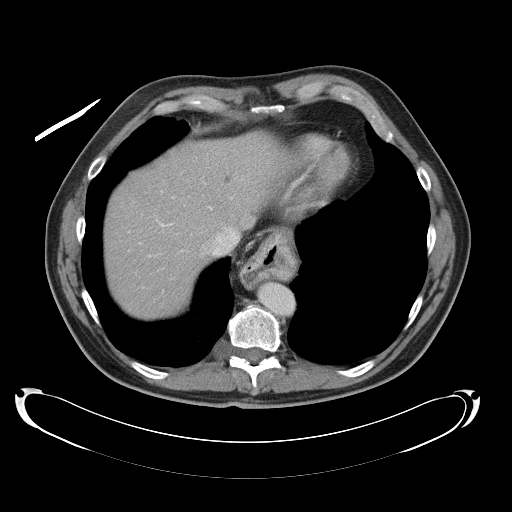
[im 92/138  bone]
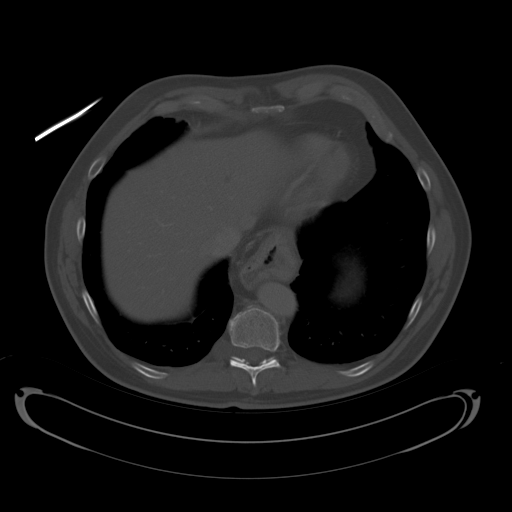
[im 99/138  soft-tissue]
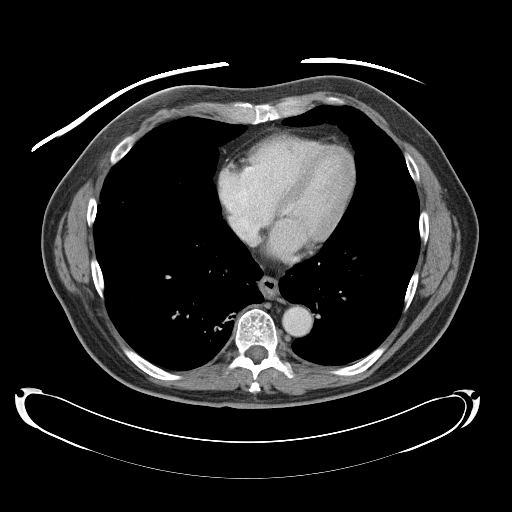
[im 107/138  soft-tissue]
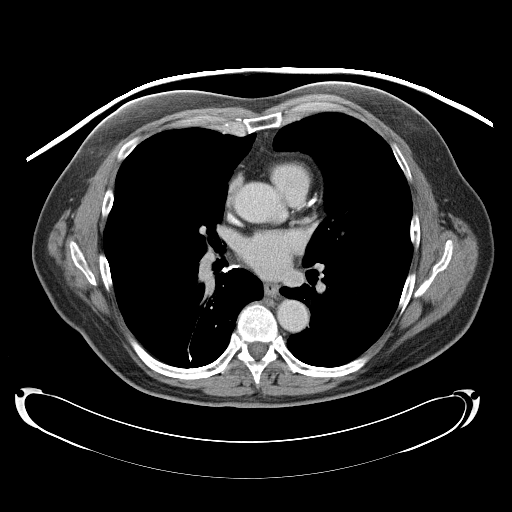
[im 122/138  soft-tissue]
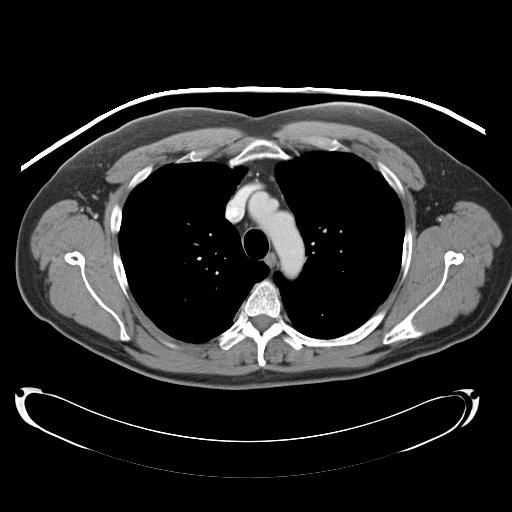
[im 130/138  soft-tissue]
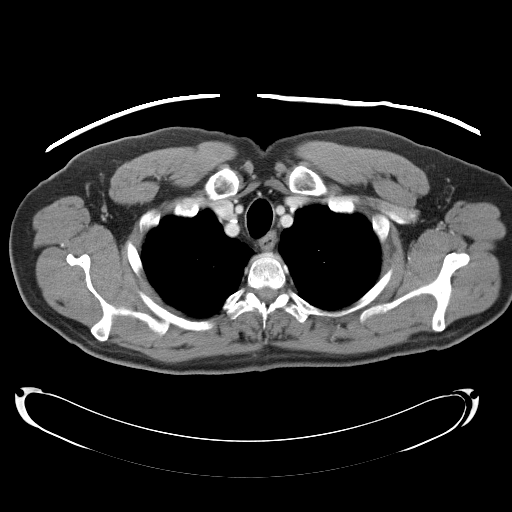

[Series 602: <mpr thick range> · coronal · 1.34mm/px · 3 of 98 slices shown]
[im 33/98  soft-tissue]
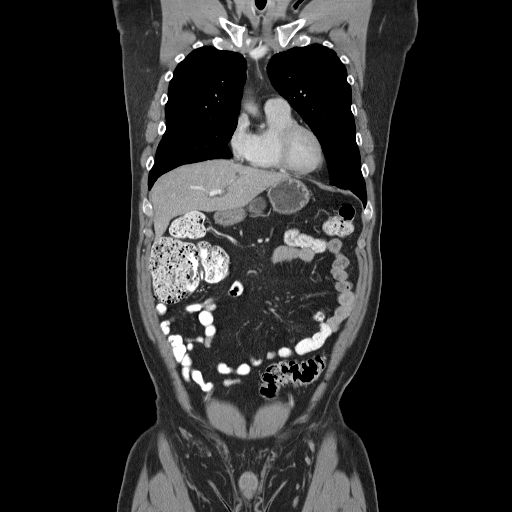
[im 44/98  soft-tissue]
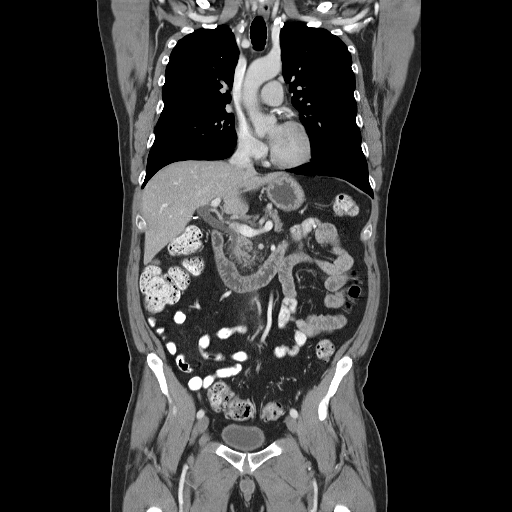
[im 54/98  soft-tissue]
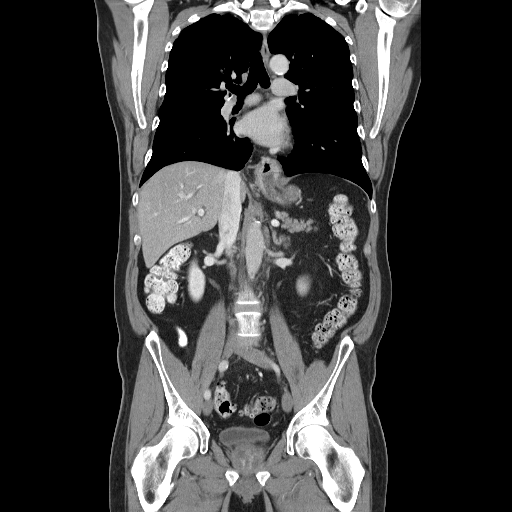

[16 of 46 positions shown; findings below may reference images not displayed]

FINDINGS: No enlarged axillary or supraclavicular lymph nodes.

There is no enlarged mediastinal or hilar lymph nodes.

No pericardial or pleural effusion.

There is a moderate-sized hiatal hernia.

There is a pulmonary nodule within the left base which measures 5
mm, image 43.  This is unchanged from previous exam.

No new or enlarging pulmonary nodules or masses.

Review of the visualized osseous structures is unremarkable.

There are no worrisome lytic or sclerotic lesions.
IMPRESSION: 1.  Stable CT of the chest.  Negative for mass or adenopathy.

CT ABDOMEN:
FINDINGS: The spleen appears normal.

The adrenal glands appear normal. The right kidney is normal.

There is a simple appearing cyst within the inferior pole of the
left kidney.

Pancreas is normal.

The patient is status post cholecystectomy.  The common bile duct
measures up to 12 mm.  This is not significantly changed from prior
exam.

There are two low density structures within the left hepatic lobe.
These are stable, likely representing simple cysts.  Small low
density structure within the inferior right hepatic lobe is also
stable likely representing a cyst.

Hazy soft tissue stranding within the retroperitoneum and root of
the small bowel mesentery is similar to prior study.  There are no
enlarged retroperitoneal or small bowel mesenteric lymph nodes.

Review of the visualized osseous structures is significant for
degenerative disc disease.

There is no lytic or sclerotic lesions identified.
IMPRESSION: 1.  Stable CT of the upper abdomen.  Negative for mass or
adenopathy.

CT PELVIS
FINDINGS: No enlarged pelvic or inguinal lymph nodes.

The urinary bladder appears normal.

The pelvic bowel loops are unremarkable.

Negative for free fluid or abnormal fluid collections.

There is no pelvic mass.
IMPRESSION: 1.  Negative for mass or adenopathy.

## 2008-10-18 LAB — ERYTHROPOIETIN: Erythropoietin: 10.7 m[IU]/mL (ref 2.6–34.0)

## 2009-04-10 ENCOUNTER — Ambulatory Visit: Payer: Self-pay | Admitting: Oncology

## 2009-04-14 ENCOUNTER — Ambulatory Visit (HOSPITAL_COMMUNITY): Admission: RE | Admit: 2009-04-14 | Discharge: 2009-04-14 | Payer: Self-pay | Admitting: Oncology

## 2009-04-14 LAB — COMPREHENSIVE METABOLIC PANEL
AST: 25 U/L (ref 0–37)
Albumin: 4 g/dL (ref 3.5–5.2)
BUN: 19 mg/dL (ref 6–23)
CO2: 28 mEq/L (ref 19–32)
Calcium: 9.1 mg/dL (ref 8.4–10.5)
Chloride: 106 mEq/L (ref 96–112)
Creatinine, Ser: 1.07 mg/dL (ref 0.40–1.50)
Glucose, Bld: 105 mg/dL — ABNORMAL HIGH (ref 70–99)
Potassium: 4.5 mEq/L (ref 3.5–5.3)

## 2009-04-14 LAB — CBC WITH DIFFERENTIAL/PLATELET
Basophils Absolute: 0 10*3/uL (ref 0.0–0.1)
EOS%: 1.9 % (ref 0.0–7.0)
Eosinophils Absolute: 0.1 10*3/uL (ref 0.0–0.5)
HGB: 17.2 g/dL — ABNORMAL HIGH (ref 13.0–17.1)
LYMPH%: 17 % (ref 14.0–49.0)
MCH: 30.4 pg (ref 27.2–33.4)
MCV: 90.8 fL (ref 79.3–98.0)
MONO%: 8 % (ref 0.0–14.0)
NEUT#: 5 10*3/uL (ref 1.5–6.5)
NEUT%: 72.7 % (ref 39.0–75.0)
Platelets: 172 10*3/uL (ref 140–400)
RDW: 12.2 % (ref 11.0–14.6)

## 2009-04-14 LAB — SEDIMENTATION RATE: Sed Rate: 2 mm/hr (ref 0–16)

## 2009-04-14 LAB — LACTATE DEHYDROGENASE: LDH: 134 U/L (ref 94–250)

## 2009-04-14 IMAGING — CT CT CHEST W/ CM
2 of 5 series · 16 of 46 positions shown, 18 images · IV contrast (omnipaque)
Comparison: CT [DATE].

CT CHEST

CLINICAL DATA: Lymphoma diagnosed in [XY] with lung metastasis
diagnosed in [XY].  Chemotherapy complete.

CT CHEST, ABDOMEN AND PELVIS WITH CONTRAST
TECHNIQUE: Multidetector CT imaging of the chest, abdomen and
pelvis was performed following the standard protocol during bolus
administration of intravenous contrast.
Contrast: 100 ml Omnipaque 300

[Series 2: cap with st · axial · 0.79mm/px · z∈[-657,-52]mm · 13 of 137 slices shown, 15 images]
[im 8/137  soft-tissue]
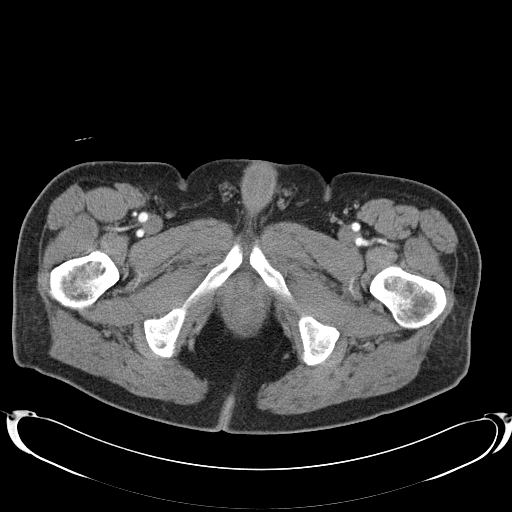
[im 8/137  bone]
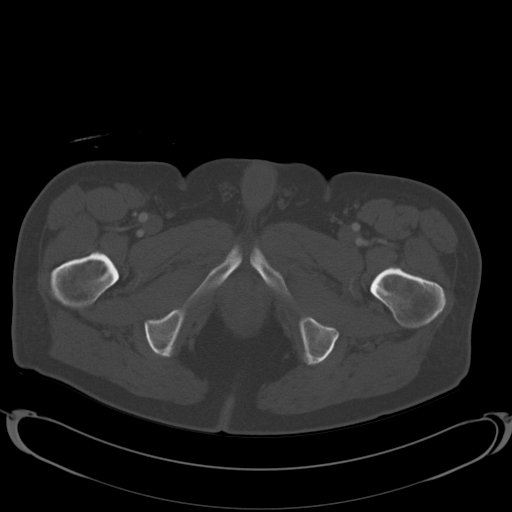
[im 16/137  soft-tissue]
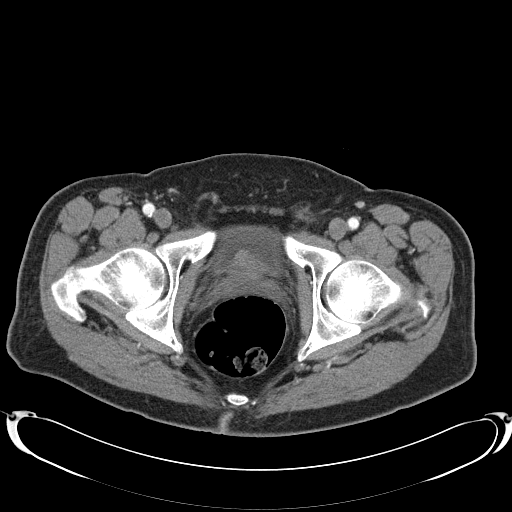
[im 31/137  soft-tissue]
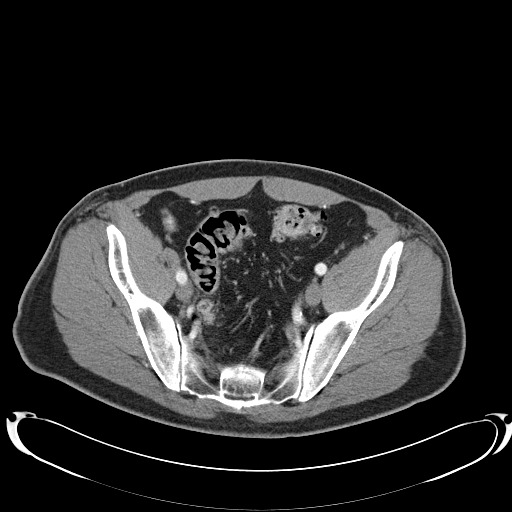
[im 38/137  soft-tissue]
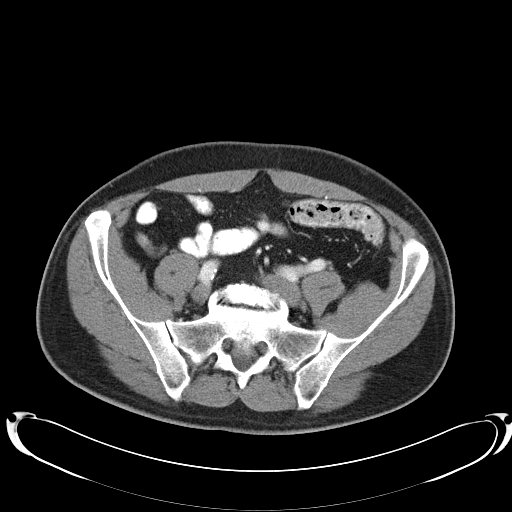
[im 46/137  soft-tissue]
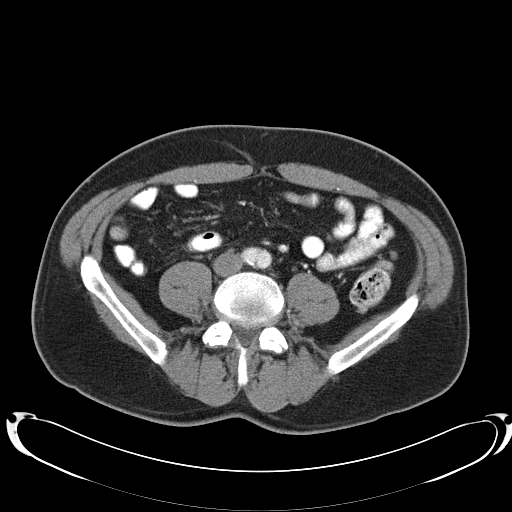
[im 61/137  soft-tissue]
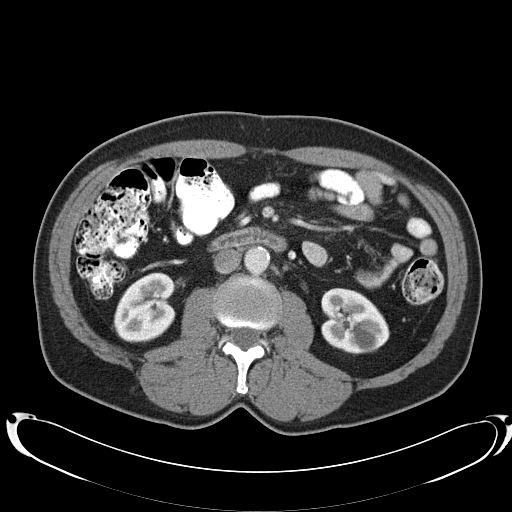
[im 69/137  soft-tissue]
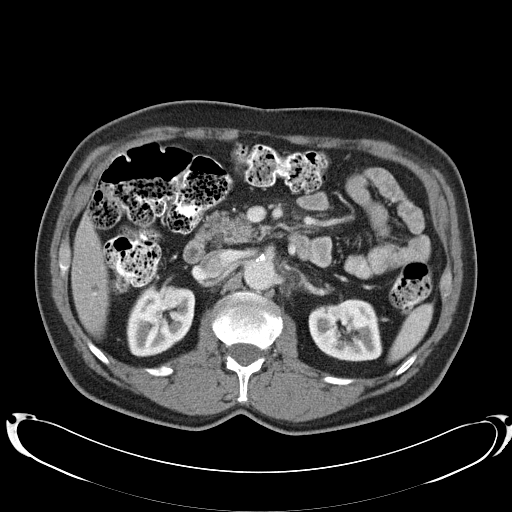
[im 76/137  soft-tissue]
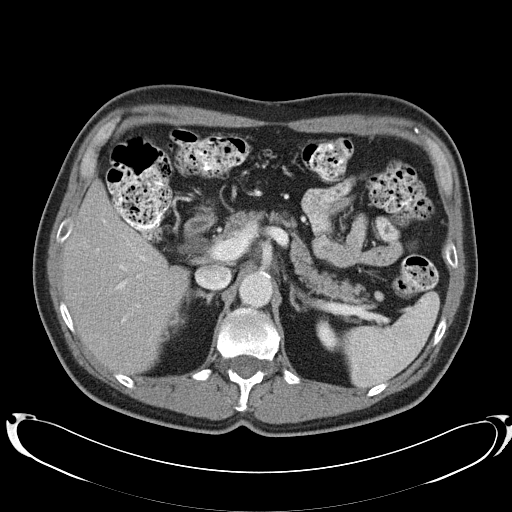
[im 91/137  soft-tissue]
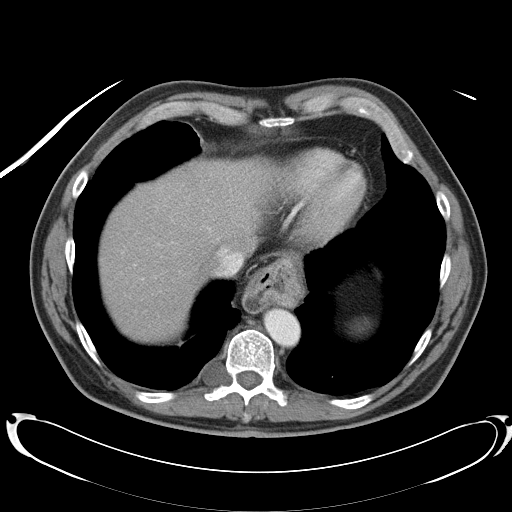
[im 91/137  bone]
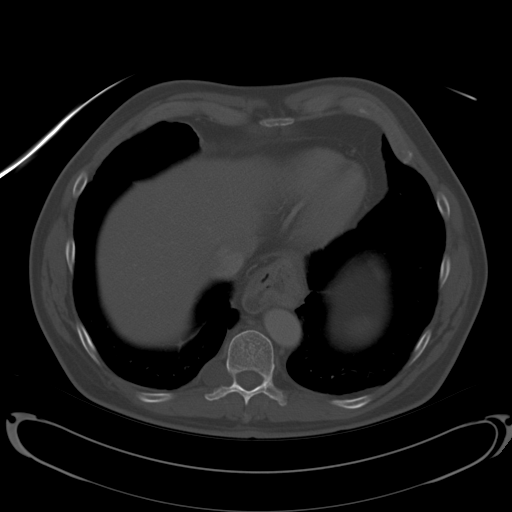
[im 99/137  soft-tissue]
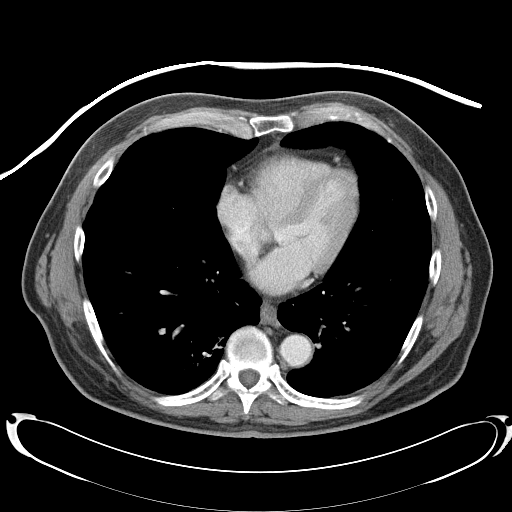
[im 106/137  soft-tissue]
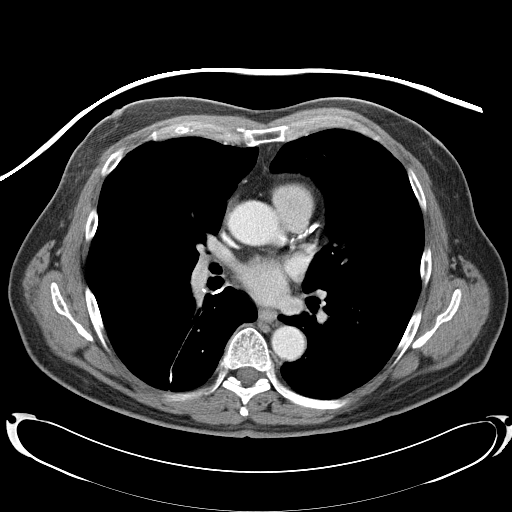
[im 121/137  soft-tissue]
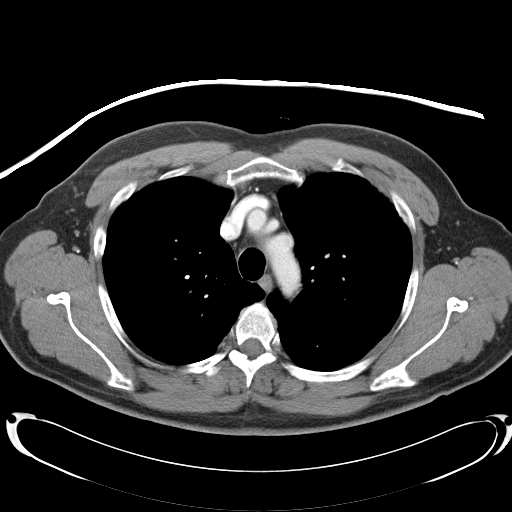
[im 129/137  soft-tissue]
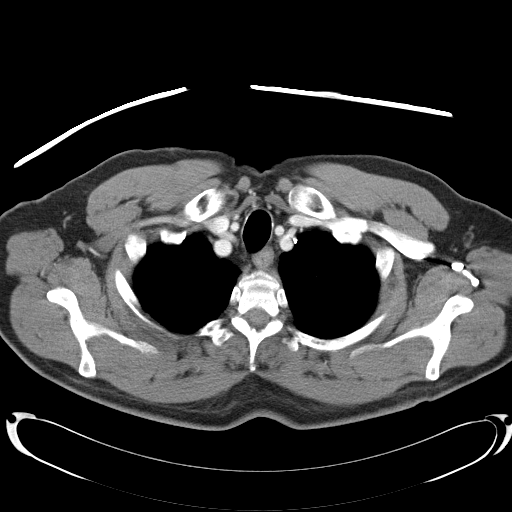

[Series 602: <mpr thick range> · coronal · 1.34mm/px · 3 of 91 slices shown]
[im 31/91  soft-tissue]
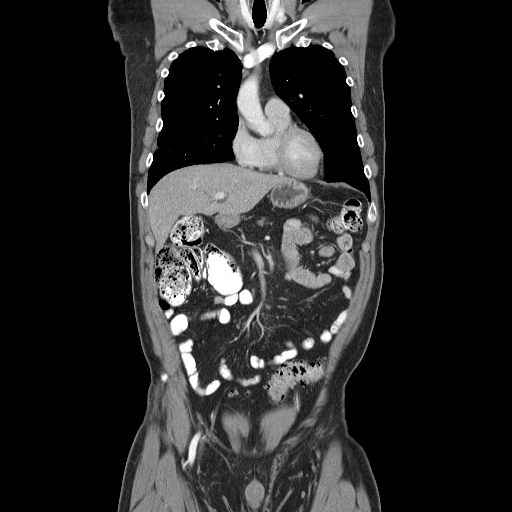
[im 41/91  soft-tissue]
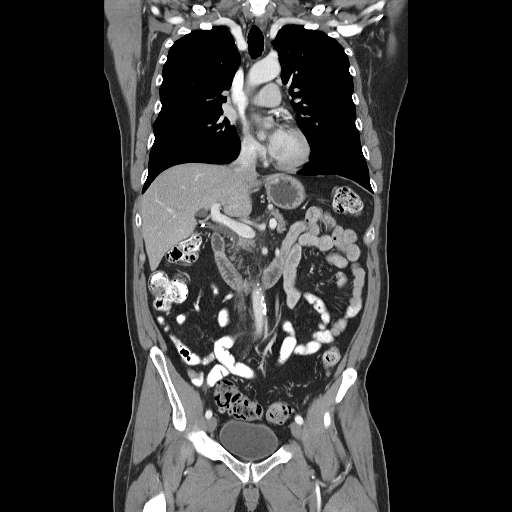
[im 51/91  soft-tissue]
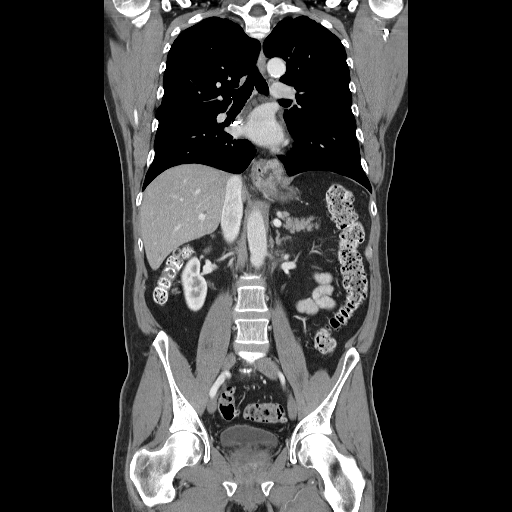

[16 of 46 positions shown; findings below may reference images not displayed]

FINDINGS: No evidence of axillary or supraclavicular
lymphadenopathy.  Small 7 mm short axis prevascular lymph node is
unchanged.  There is a 9 mm short axis right hilar lymph node which
is also unchanged.  No pericardial effusion.

There is postsurgical change in the right lower lobe without
evidence of nodularity effect.  No new suspicious pulmonary
nodules. Airways appear normal.
IMPRESSION: No evidence of lymphoma recurrence in the thorax.

CT ABDOMEN AND PELVIS
FINDINGS: Hypodense lesions in the left hepatic lobe are stable.
There is mild biliary ductal dilatation unchanged from prior and
likely related to prior cholecystectomy.  There is an additional
low density lesion in the inferior right hepatic lobe (image 69)
which is also unchanged.  The pancreas, spleen, date renal glands,
and kidneys are unchanged.  There is a simple cyst within the left
kidney.

There is a small hiatal hernia.  The stomach, duodenum, small
bowel, and cecum appear normal.  There are diverticula of the
descending and sigmoid colon.  Rectum appears normal.

Abdominal aorta normal caliber.  There is hazy  in the
retroperitoneum adjacent to the celiac axis which is unchanged from
prior may represent treated lymphoma.

The bladder is normal.  The bladder prostate are normal.  No
evidence of pelvic or inguinal lymphadenopathy. Review of  bone
windows demonstrates no aggressive osseous lesions.
IMPRESSION: 1.  No evidence of lymphoma recurrence within the abdomen pelvis.

2. Hazy retroperitoneum along the abdominal aorta is unchanged and
likely represents treated lymphoma.
3.  Stable hypodensities in the liver.
4.  Sigmoid diverticulosis without evidence of acute
diverticulitis.

## 2009-10-22 ENCOUNTER — Ambulatory Visit (HOSPITAL_COMMUNITY): Admission: RE | Admit: 2009-10-22 | Discharge: 2009-10-22 | Payer: Self-pay | Admitting: Oncology

## 2009-10-22 ENCOUNTER — Ambulatory Visit: Payer: Self-pay | Admitting: Oncology

## 2009-10-22 LAB — COMPREHENSIVE METABOLIC PANEL
ALT: 24 U/L (ref 0–53)
AST: 24 U/L (ref 0–37)
CO2: 30 mEq/L (ref 19–32)
Chloride: 104 mEq/L (ref 96–112)
Creatinine, Ser: 1.12 mg/dL (ref 0.40–1.50)
Sodium: 139 mEq/L (ref 135–145)
Total Bilirubin: 1.1 mg/dL (ref 0.3–1.2)
Total Protein: 6.4 g/dL (ref 6.0–8.3)

## 2009-10-22 LAB — CBC WITH DIFFERENTIAL/PLATELET
Basophils Absolute: 0 10*3/uL (ref 0.0–0.1)
HCT: 49.4 % (ref 38.4–49.9)
HGB: 17.2 g/dL — ABNORMAL HIGH (ref 13.0–17.1)
MONO#: 0.9 10*3/uL (ref 0.1–0.9)
NEUT#: 7.1 10*3/uL — ABNORMAL HIGH (ref 1.5–6.5)
NEUT%: 75.6 % — ABNORMAL HIGH (ref 39.0–75.0)
RDW: 12.6 % (ref 11.0–14.6)
WBC: 9.4 10*3/uL (ref 4.0–10.3)
lymph#: 1.3 10*3/uL (ref 0.9–3.3)

## 2009-10-22 LAB — MORPHOLOGY: PLT EST: ADEQUATE

## 2009-10-22 LAB — LACTATE DEHYDROGENASE: LDH: 121 U/L (ref 94–250)

## 2009-10-22 IMAGING — CT CT CHEST W/ CM
1 of 3 series · 14 of 32 positions shown, 18 images · IV contrast (agent unspecified)
Comparison: [DATE]

CT CHEST

CLINICAL DATA: Non-Hodgkins lymphoma with lung disease.  Recent
chemotherapy complete.

CT CHEST, ABDOMEN AND PELVIS WITH CONTRAST
TECHNIQUE: Contiguous axial images of the chest abdomen and pelvis
were obtained after IV contrast administration.
Contrast: 100  ml [DQ]

[Series 2: cap with st · axial · 0.90mm/px · z∈[-674,-69]mm · 14 of 139 slices shown, 18 images]
[im 9/139  mediastinal]
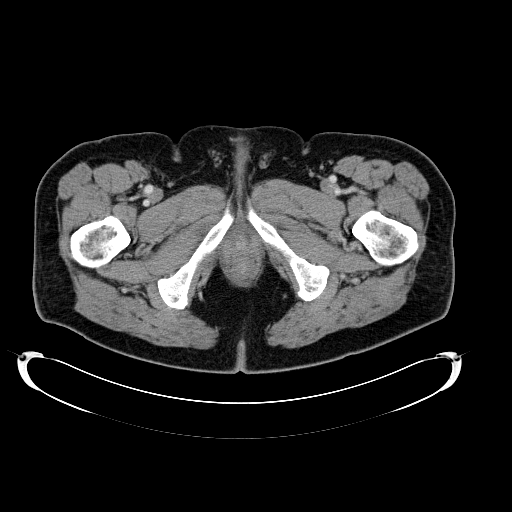
[im 9/139  lung]
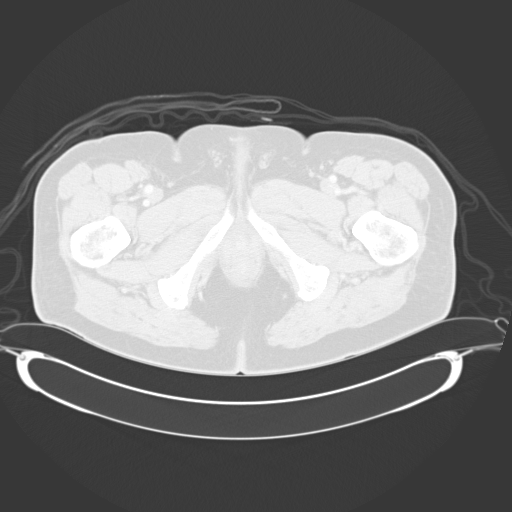
[im 18/139  lung]
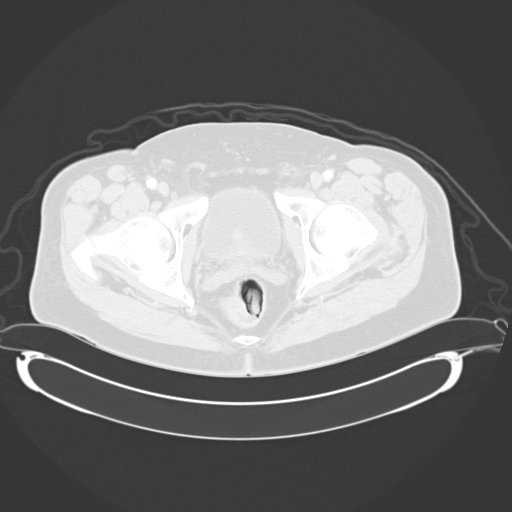
[im 35/139  lung]
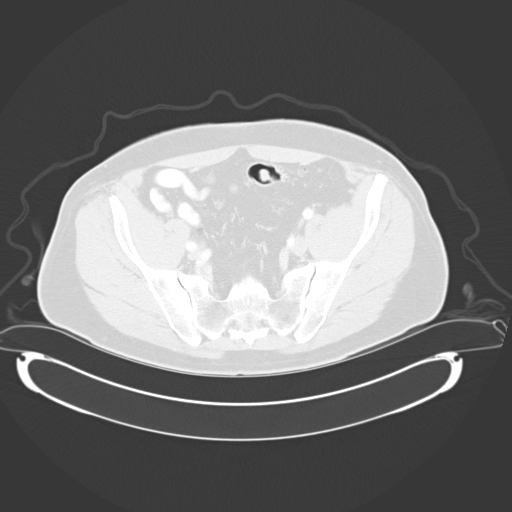
[im 44/139  lung]
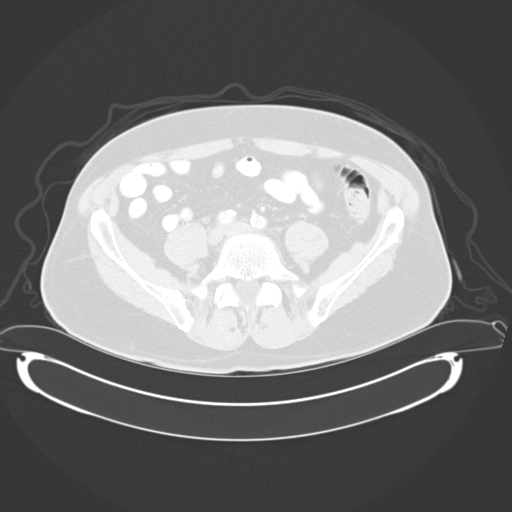
[im 47/139  mediastinal]
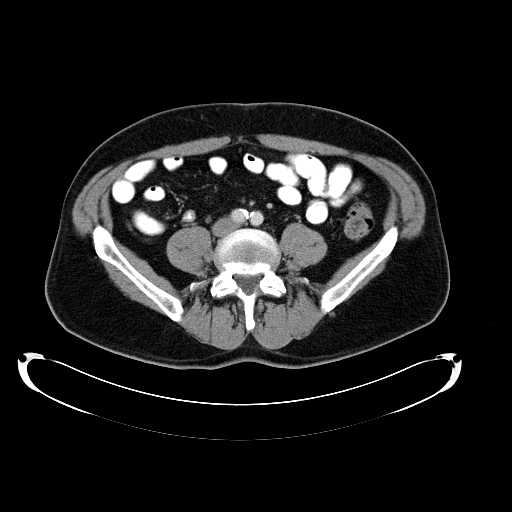
[im 47/139  lung]
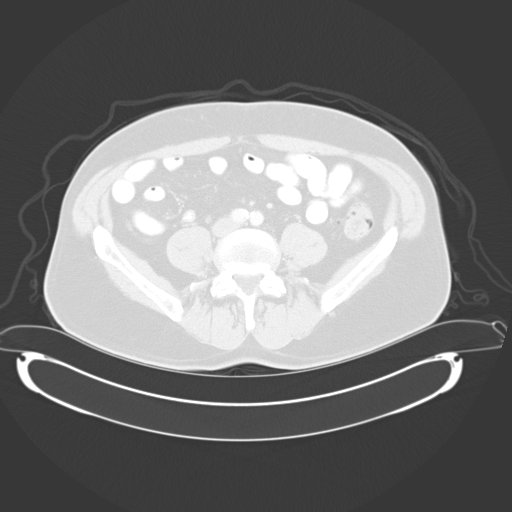
[im 61/139  lung]
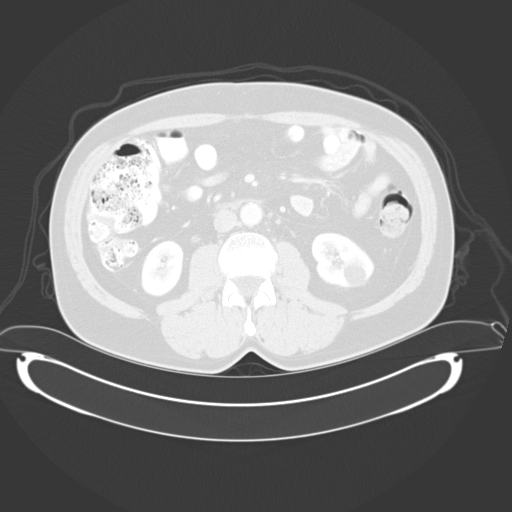
[im 68/139  lung]
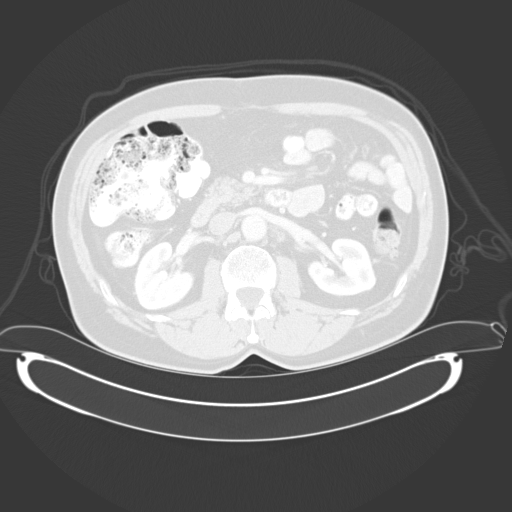
[im 70/139  lung]
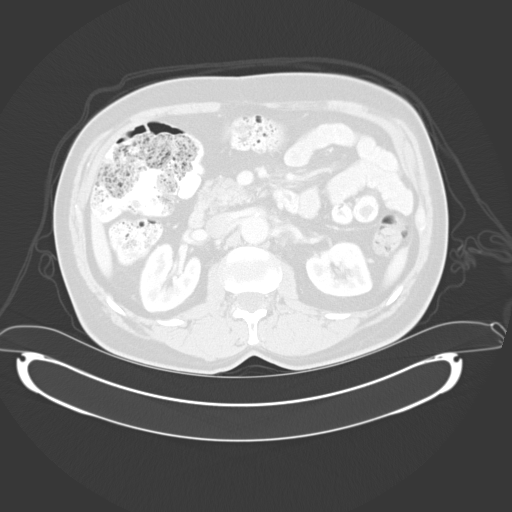
[im 78/139  mediastinal]
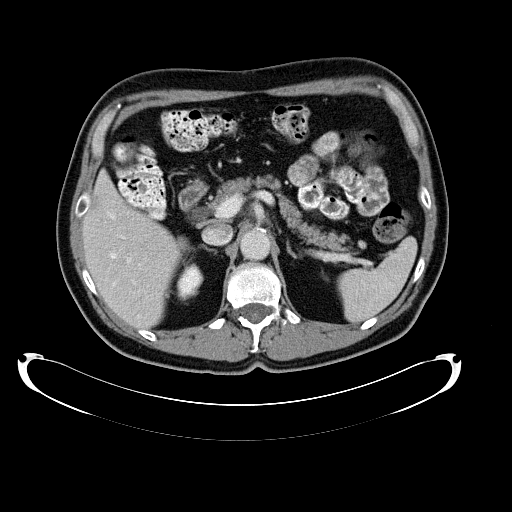
[im 78/139  lung]
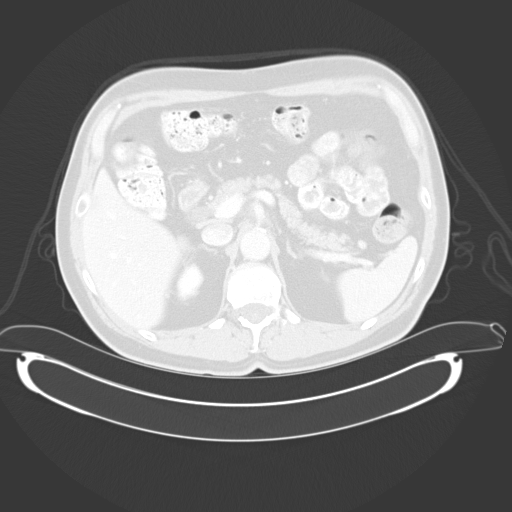
[im 93/139  lung]
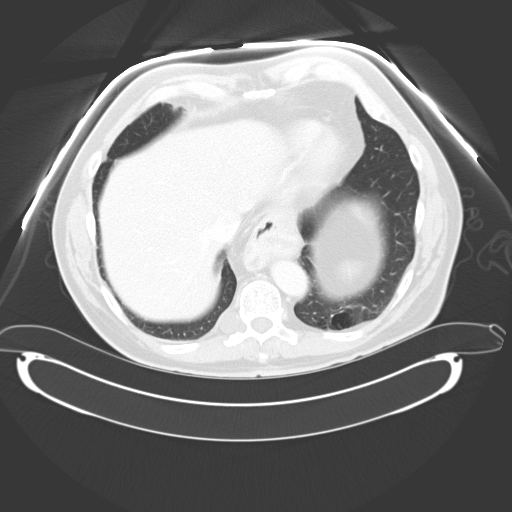
[im 95/139  lung]
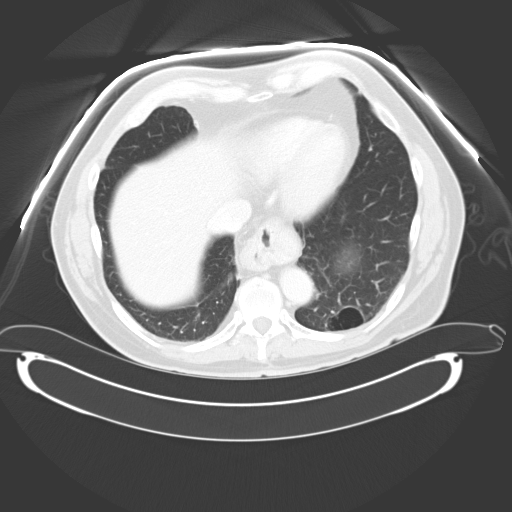
[im 104/139  lung]
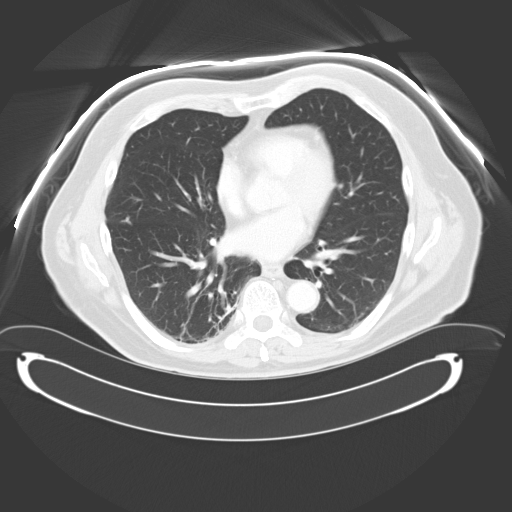
[im 121/139  mediastinal]
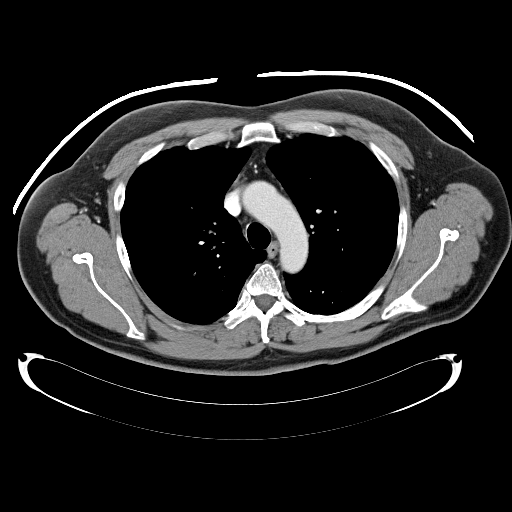
[im 121/139  lung]
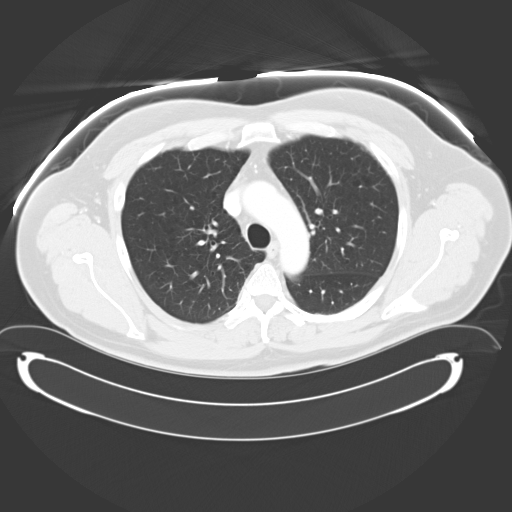
[im 130/139  lung]
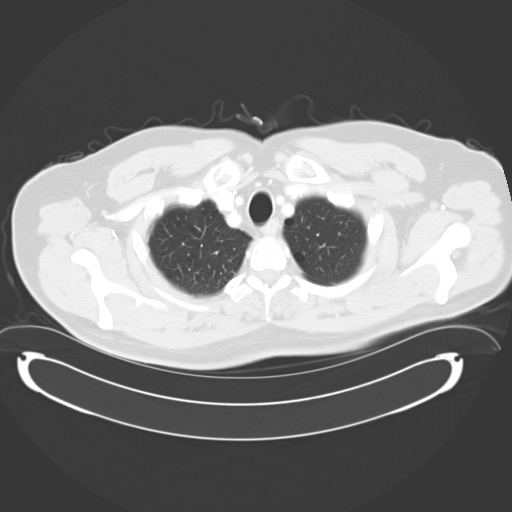

[14 of 32 positions shown; findings below may reference images not displayed]

FINDINGS: Lung windows demonstrate mild centrilobular emphysema.
Surgical sutures in the superior segment right lower lobe.

Soft tissue windows demonstrate normal heart size without
pericardial or pleural effusion.

Advanced coronary artery atherosclerosis, including within the only
the left main. No mediastinal or hilar adenopathy.  Moderate hiatal
hernia.  Stable small prevascular lymph nodes.  Numerous  foci of
bilateral lower thoracic nerve root cysts.  The largest is on the
right on image 48 series 2.  Present over numerous prior exams.
IMPRESSION: 1. No acute process or evidence of active lymphoma within the
chest.
2.  Stable appearance of numerous lower thoracic nerve root cysts.
Most likely related to perineural cysts/pseudomeningoceles.  Of
doubtful clinical significance.  Can be reevaluated on follow-up.
3.  Moderate hiatal hernia.
4.  Coronary artery atherosclerosis.

CT ABDOMEN AND PELVIS
FINDINGS: Stable hepatic cysts and too small to characterize
lesions.  Normal spleen, stomach, pancreas.  Cholecystectomy.
Stable borderline intrahepatic biliary ductal dilatation.  Normal
common duct.  Normal adrenal glands and right kidney.  Inter/lower
pole left renal cysts. No retroperitoneal or retrocrural
adenopathy.

Similar appearance of ill-defined edema within the retroperitoneum
including along the origin of the superior mesenteric artery and
left renal artery.

Sigmoid diverticulosis. Colonic stool burden suggests constipation.

Normal small bowel without abdominal ascites.

  No pelvic adenopathy.  Normal urinary bladder.  Mild
prostatomegaly. No significant free fluid.  Degenerative disc
disease at the lumbosacral junction.
IMPRESSION: 1.  No acute process or evidence of active lymphoma within the
abdomen or pelvis.
2.  Similar appearance of retroperitoneal edema, likely related to
treated lymphoma.
3.  Possible constipation.

## 2009-12-12 ENCOUNTER — Ambulatory Visit
Admission: RE | Admit: 2009-12-12 | Discharge: 2009-12-12 | Payer: Self-pay | Source: Home / Self Care | Admitting: Internal Medicine

## 2010-01-30 ENCOUNTER — Other Ambulatory Visit: Payer: Self-pay | Admitting: Oncology

## 2010-01-30 DIAGNOSIS — C8590 Non-Hodgkin lymphoma, unspecified, unspecified site: Secondary | ICD-10-CM

## 2010-02-01 ENCOUNTER — Encounter: Payer: Self-pay | Admitting: Oncology

## 2010-04-20 ENCOUNTER — Other Ambulatory Visit: Payer: Self-pay | Admitting: Oncology

## 2010-04-20 ENCOUNTER — Ambulatory Visit (HOSPITAL_COMMUNITY)
Admission: RE | Admit: 2010-04-20 | Discharge: 2010-04-20 | Disposition: A | Discharge status: Home / Self Care | Payer: Medicare Other | Source: Ambulatory Visit | Attending: Oncology | Admitting: Oncology

## 2010-04-20 ENCOUNTER — Encounter (HOSPITAL_BASED_OUTPATIENT_CLINIC_OR_DEPARTMENT_OTHER): Payer: Medicare Other | Admitting: Oncology

## 2010-04-20 ENCOUNTER — Other Ambulatory Visit (HOSPITAL_COMMUNITY): Payer: Self-pay

## 2010-04-20 DIAGNOSIS — C8590 Non-Hodgkin lymphoma, unspecified, unspecified site: Secondary | ICD-10-CM

## 2010-04-20 DIAGNOSIS — C8589 Other specified types of non-Hodgkin lymphoma, extranodal and solid organ sites: Secondary | ICD-10-CM | POA: Insufficient documentation

## 2010-04-20 DIAGNOSIS — Z09 Encounter for follow-up examination after completed treatment for conditions other than malignant neoplasm: Secondary | ICD-10-CM | POA: Insufficient documentation

## 2010-04-20 DIAGNOSIS — C8292 Follicular lymphoma, unspecified, intrathoracic lymph nodes: Secondary | ICD-10-CM

## 2010-04-20 DIAGNOSIS — C8299 Follicular lymphoma, unspecified, extranodal and solid organ sites: Secondary | ICD-10-CM

## 2010-04-20 LAB — CBC WITH DIFFERENTIAL/PLATELET
BASO%: 0.4 % (ref 0.0–2.0)
LYMPH%: 16.3 % (ref 14.0–49.0)
MCHC: 34.1 g/dL (ref 32.0–36.0)
MONO#: 0.6 10*3/uL (ref 0.1–0.9)
NEUT#: 5.4 10*3/uL (ref 1.5–6.5)
Platelets: 187 10*3/uL (ref 140–400)
RBC: 5.47 10*6/uL (ref 4.20–5.82)
RDW: 13.3 % (ref 11.0–14.6)
WBC: 7.3 10*3/uL (ref 4.0–10.3)

## 2010-04-20 LAB — CMP (CANCER CENTER ONLY)
AST: 26 U/L (ref 11–38)
Albumin: 3.5 g/dL (ref 3.3–5.5)
Alkaline Phosphatase: 59 U/L (ref 26–84)
BUN, Bld: 16 mg/dL (ref 7–22)
Calcium: 9.1 mg/dL (ref 8.0–10.3)
Creat: 1.2 mg/dl (ref 0.6–1.2)
Glucose, Bld: 79 mg/dL (ref 73–118)

## 2010-04-20 LAB — SEDIMENTATION RATE: Sed Rate: 1 mm/hr (ref 0–16)

## 2010-04-20 IMAGING — CT CT ABD-PELV W/ CM
2 of 5 series · 17 of 46 positions shown, 19 images · IV contrast (agent unspecified)
Comparison: [DATE]

CT CHEST

CLINICAL DATA: Non-Hodgkins lymphoma with pulmonary mets,
chemotherapy completed.

CT CHEST, ABDOMEN AND PELVIS WITH CONTRAST
TECHNIQUE: Multidetector CT imaging of the chest, abdomen and
pelvis was performed following the standard protocol during bolus
administration of intravenous contrast.
Contrast: 100 ml [ZK] IV

[Series 2: cap with st · axial · 0.84mm/px · z∈[-750,-140]mm · 14 of 138 slices shown, 16 images]
[im 8/138  soft-tissue]
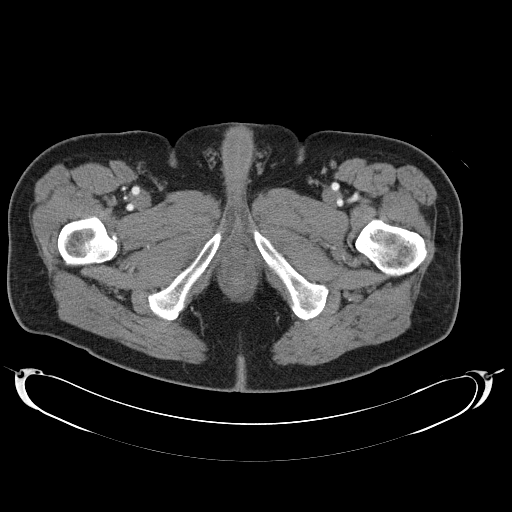
[im 8/138  bone]
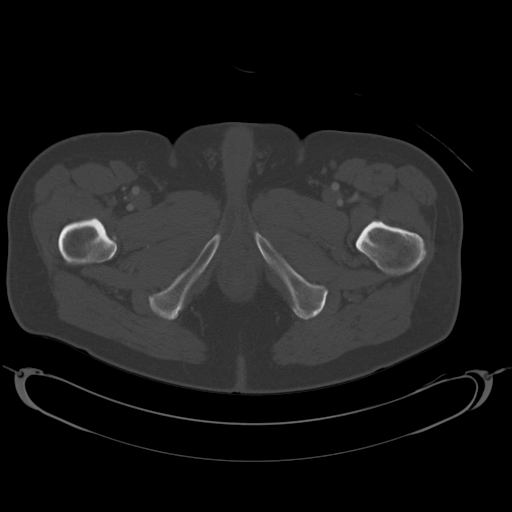
[im 15/138  soft-tissue]
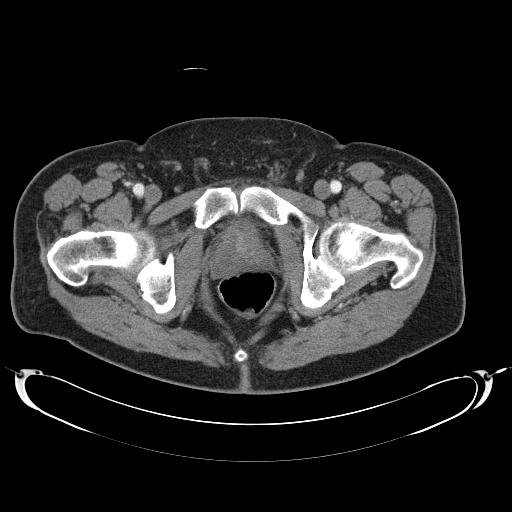
[im 29/138  soft-tissue]
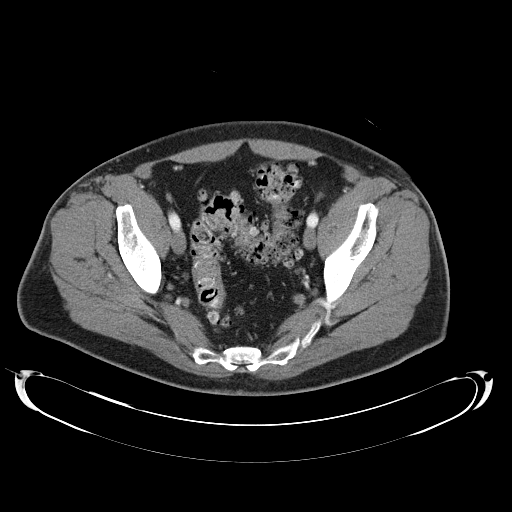
[im 37/138  soft-tissue]
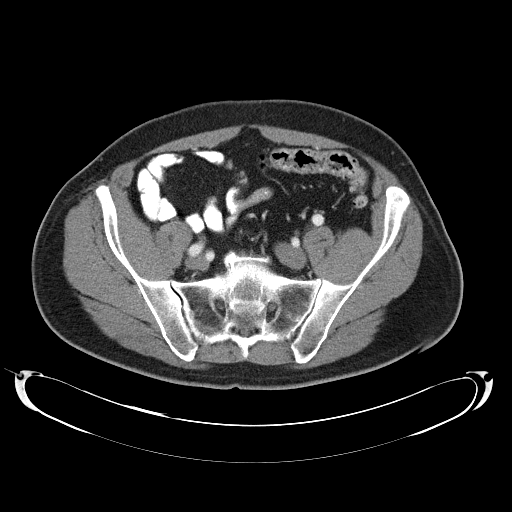
[im 44/138  soft-tissue]
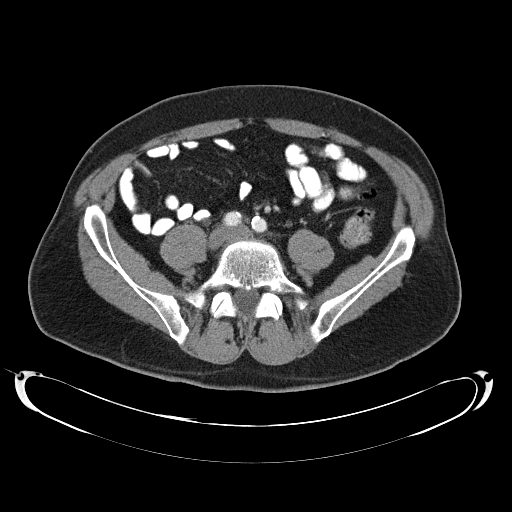
[im 58/138  soft-tissue]
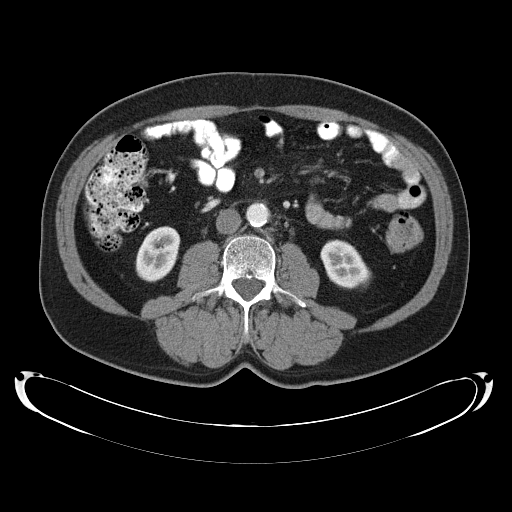
[im 65/138  soft-tissue]
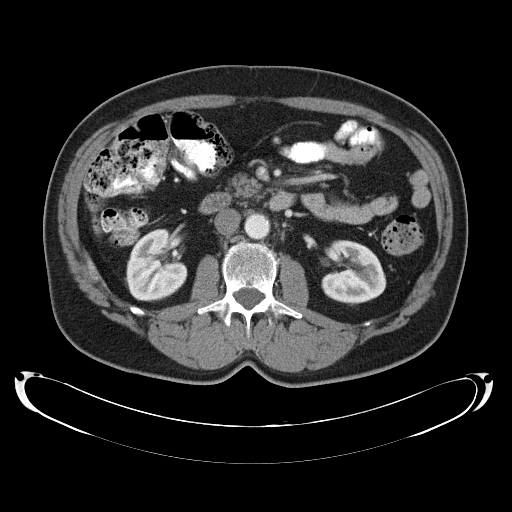
[im 73/138  soft-tissue]
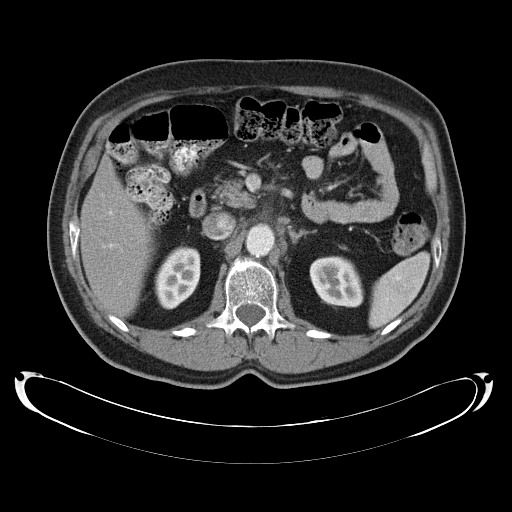
[im 80/138  soft-tissue]
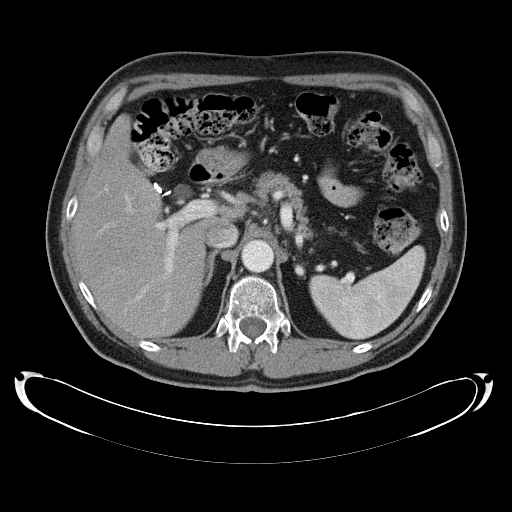
[im 80/138  bone]
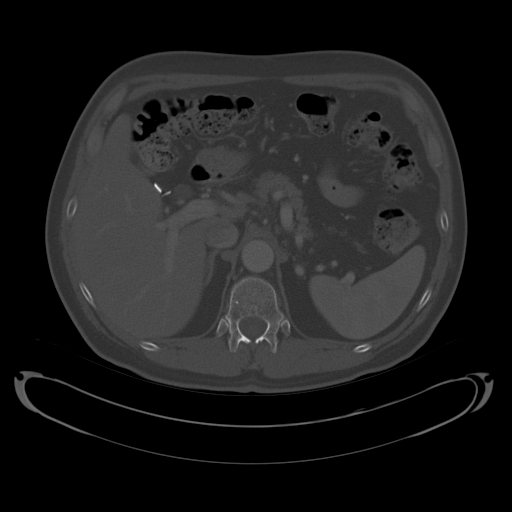
[im 94/138  soft-tissue]
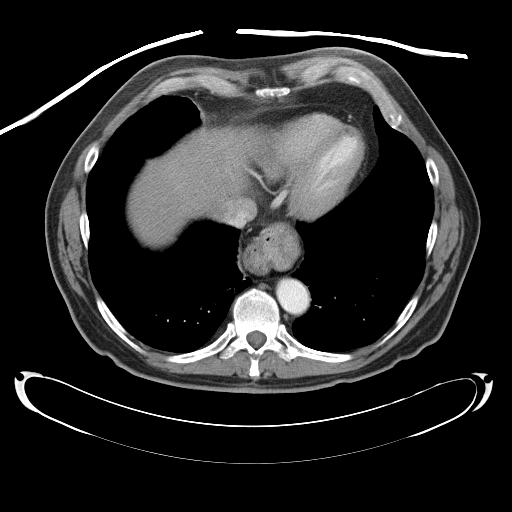
[im 101/138  soft-tissue]
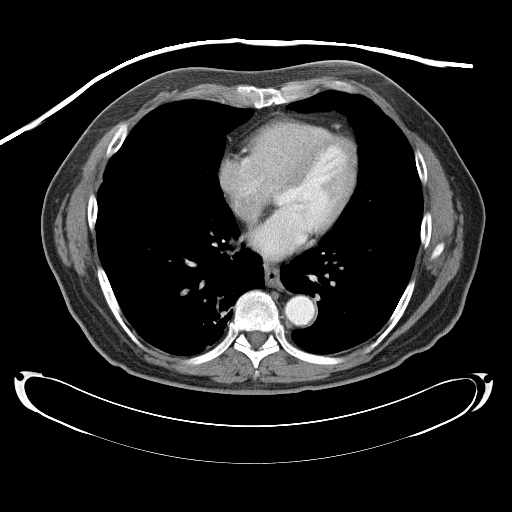
[im 109/138  soft-tissue]
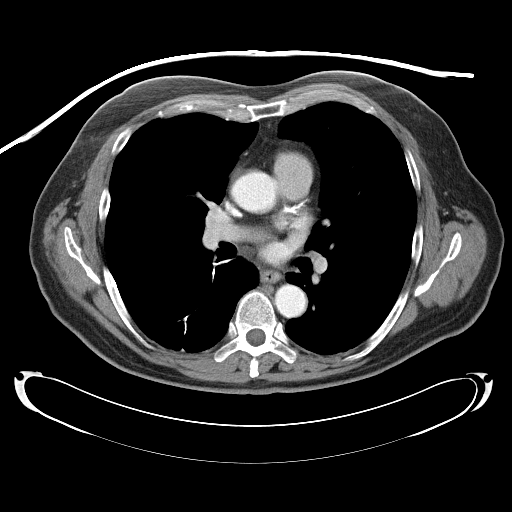
[im 123/138  soft-tissue]
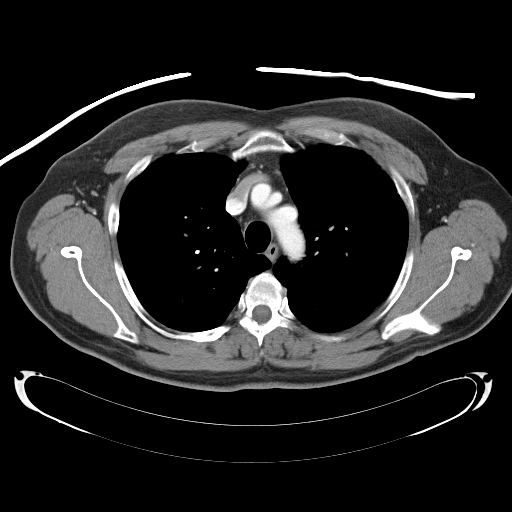
[im 130/138  soft-tissue]
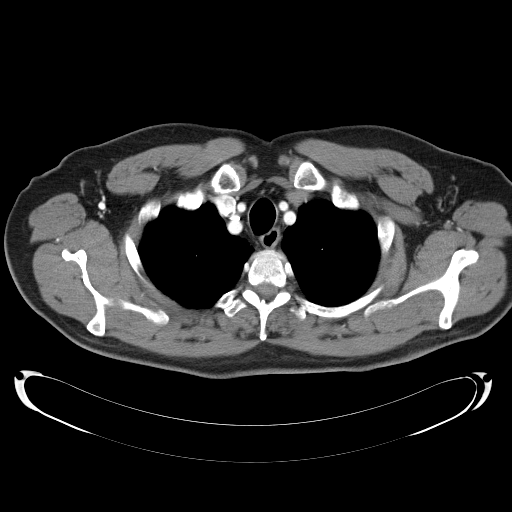

[Series 602: <mpr thick range> · coronal · 1.34mm/px · 3 of 85 slices shown]
[im 29/85  soft-tissue]
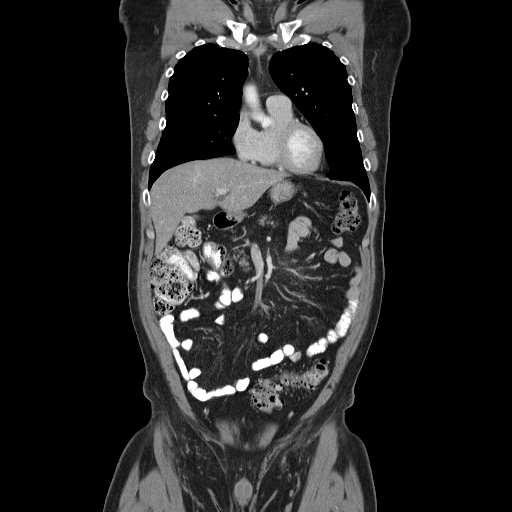
[im 38/85  soft-tissue]
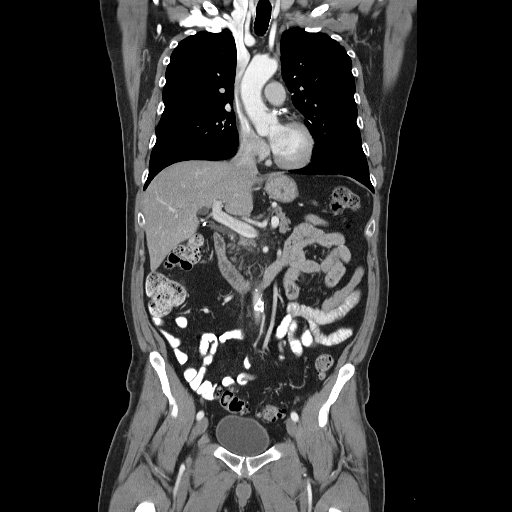
[im 47/85  soft-tissue]
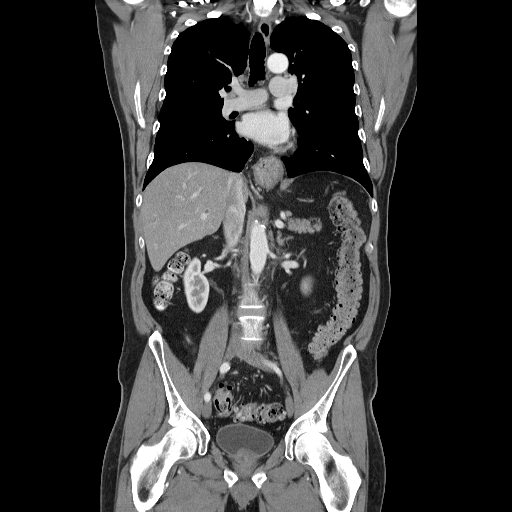

[17 of 46 positions shown; findings below may reference images not displayed]

FINDINGS: Visualized thyroid is unremarkable.

Mild scarring in the right lower lobe.  No suspicious pulmonary
nodules.

No mediastinal or hilar lymphadenopathy.

Heart is normal in size.  No pericardial effusion.  Coronary
atherosclerosis.

Stable thoracic perineural cysts/meningoceles.
IMPRESSION: No CT evidence of lymphomatous involvement in the chest.

CT ABDOMEN AND PELVIS
FINDINGS: Small hiatal hernia.

Left hepatic lobe cyst. Subcentimeter cystic lesion in the inferior
right hepatic lobe, too small to characterize, likely benign.

Spleen, pancreas, adrenal glands are within normal limits.  Status
post cholecystectomy.

Left renal cyst.  No hydronephrosis.

Again seen is mild abnormal soft tissue/mesenteric stranding
surrounding the origin of the celiac axis and SMA.

No evidence of bowel obstruction.  Colonic diverticulosis, without
associated inflammatory changes.

Bladder is within normal limits.

Prostate is mildly enlarged, measuring up to 5.2 cm in transverse
dimension.

Degenerative changes of the visualized thoracolumbar spine.
IMPRESSION: No CT evidence of active lymphomatous involvement in the
abdomen/pelvis.

Mild abnormal soft tissue/mesenteric stranding in the
retroperitoneum, unchanged, likely reflecting treated lymphoma.

Additional stable ancillary findings as above.

## 2010-04-20 MED ORDER — IOHEXOL 300 MG/ML  SOLN
100.0000 mL | Freq: Once | INTRAMUSCULAR | Status: AC | PRN
  Administered 2010-04-20: 100 mL via INTRAVENOUS

## 2010-04-28 ENCOUNTER — Encounter (HOSPITAL_BASED_OUTPATIENT_CLINIC_OR_DEPARTMENT_OTHER): Payer: Medicare Other | Admitting: Oncology

## 2010-04-28 ENCOUNTER — Other Ambulatory Visit: Payer: Self-pay | Admitting: Oncology

## 2010-04-28 DIAGNOSIS — C8292 Follicular lymphoma, unspecified, intrathoracic lymph nodes: Secondary | ICD-10-CM

## 2010-04-28 DIAGNOSIS — C8590 Non-Hodgkin lymphoma, unspecified, unspecified site: Secondary | ICD-10-CM

## 2010-05-26 NOTE — Assessment & Plan Note (Signed)
OFFICE VISIT   Anthony Kelly, HOUSHOLDER  DOB:  1945/01/23                                        March 07, 2007  CHART #:  [id number]   His chest x-ray done on February 6 looks fine with just evidence of  postoperative changes.  Blood pressure is 125/78, pulse 95, respirations  18.  Saturations were 95%.  Lungs were clear to auscultation and  percussion.  His incision was well healed.  He is still having a  moderate amount of pain around his incision sites, and anteriorly.  I  put him on Celebrex 100 mg twice a day.  He is to  return to work, but  it is just on light duty because of his pain.  I will see him back again  in four weeks with no chest x-ray just to check on how he is doing  regarding his pain.   Ines Bloomer, M.D.  Electronically Signed   DPB/MEDQ  D:  03/07/2007  T:  03/07/2007  Job:  [id number]

## 2010-05-26 NOTE — Op Note (Signed)
NAME:  Anthony Kelly, Anthony Kelly               ACCOUNT NO.:  [id number]   MEDICAL RECORD NO.:  [id number]          PATIENT TYPE:  INP   LOCATION:  2550                         FACILITY:  [facility]   PHYSICIAN:  Ines Bloomer, M.D. DATE OF BIRTH:  03-21-1945   DATE OF PROCEDURE:  DATE OF DISCHARGE:                               OPERATIVE REPORT   PREOPERATIVE DIAGNOSIS:  Status post treatment for non-Hodgkin's  lymphoma, right lower lobe mass, history of tobacco abuse.   POSTOPERATIVE DIAGNOSIS:  Lymphoma right lower lobe.   OPERATION:  Right video assisted thoracoscopic surgery, right lower lobe  superior segmentectomy.   SURGEON:  Dr. Patricia Nettle. Burney.   FIRST ASSISTANT:  Tina RNFA.   ANESTHESIA:  General anesthesia.   After the percutaneous insertion of all monitoring lines, the patient  underwent general anesthesia, was prepped and draped in usual sterile  manner was turned to the right lateral thoracotomy position.  A dual-  lumen tube was inserted right lung was deflated.  Two trocar sites were  in the anterior and posterior axillary line seventh intercostal space,  two trocar was were inserted.  A 0 degree scope was inserted.  The  patient had almost a semi complete fissure of his superior segment and  the lesion was seen in the inferior portion of the superior segment.  A  7-cm incision was made over the triangle of auscultation.  This  latissimus was partially divided, the serratus reflected anteriorly.  The fifth intercostal space was entered.  Small Tuffier was placed in  space.  The fissure was divided with electrocautery and as far as the  adhesions and then we dissected down the fissure to the pulmonary artery  and the superior pulmonary artery to this superior segment was dissected  out, stapled and divided with the auto suture 2 mm stapler.  Then we  were able to divide the superior portion of the fissure with the auto  suture 60 mm stapler and that exposed the right lower  bronchus to the  superior segment which was divided with the auto suture 30, 3.2 stapler.  Finally the pulmonary vein to the superior segment was dissected up  ligated proximally and distally, clipped and divided this.  Then we used  the Auto suture 60 stapler with three applications to resect the  superior segment.  One area was bleeding, was oversewn with 3-0 Vicryl.  CoSeal was applied to the staple line.  It was checked under water and  the bronchus was well closed.  Two chest tubes were brought in through  the trocar site.  A Marcaine block was done in the usual fashion.  Single On-Q was inserted in the usual fashion.  Two pericostals were  placed drilling through the sixth rib and passing around to the fifth  rib, #1 Vicryl in the muscle layer, 2-0 Vicryl subcutaneous tissue and  Dermabond for the skin.  The patient was returned to the recovery room  in stable condition.  The frozen section revealed lymphoma, MULT type  pattern.     Ines Bloomer, M.D.  Electronically Signed  DPB/MEDQ  D:  12/19/2006  T:  12/19/2006  Job:  [id number]

## 2010-05-26 NOTE — H&P (Signed)
Anthony Kelly, Anthony Kelly               ACCOUNT NO.:  [id number]   MEDICAL RECORD NO.:  [id number]          PATIENT TYPE:  OUT   LOCATION:  CARD                         FACILITY:  [facility]   PHYSICIAN:  Ines Bloomer, M.D. DATE OF BIRTH:  Aug 09, 1945   DATE OF ADMISSION:  12/09/2006  DATE OF DISCHARGE:  12/09/2006                              HISTORY & PHYSICAL   CHIEF COMPLAINT:  Left lower lobe mass.   HISTORY OF PRESENT ILLNESS:  This 65 year old patient quit smoking 2  years ago, but has long history of smoking.  He has been treated for non-  Hodgkin's lymphoma with reductive and CHOP therapy, with active  response.  Followup CT scan in April 2007 looked good, but a recent CT  scan showed a lesion in the superior segment of the left lower lobe.  PET scan was positive with an SUV of 2.7.  His pulmonary function tests  show an FVC of 3.62, and FEV1 of 2. He had no hemoptysis, fever, chills,  excessive sputum.  As this lesion was thought to be a cancer versus a  low-grade inflammatory process.  He was considered to be in complete  remission with his non-Hodgkin's lymphoma.   PAST MEDICAL HISTORY:  He has had hypertension.   MEDICATIONS:  Nabumetone 500 mg twice a day, aspirin 325 mg a day,  metoprolol 25 mg a day.   FAMILY HISTORY:  Positive for cardiac disease.   SOCIAL HISTORY:  He is married, with 2 children.  He works in  Production designer, theatre/television/film at [employer].  He quit smoking in 1984.  Does not drink  alcohol on a regular basis.   REVIEW OF SYSTEMS:  He has had some weight loss.  At 200 pounds, he is 6  foot 1.  CARDIAC:  No angina or atrial fibrillation.  PULMONARY:  No  hemoptysis, fever, or chills, excessive sputum, asthma.  GI:  No nausea,  vomiting, constipation, or GERD.  GU:  No dysuria, kidney disease, or  frequent urination.  VASCULAR:  No claudication, DVT, or TIAs.  NEUROLOGIC:  No headaches, blackouts, seizures, or dizziness.  MUSCULOSKELETAL:  No arthritis.  PSYCHIATRIC:  No  over depression or  nervousness.  EYES/ENT:  No changes in eyesight.  NEUROLOGIC:  No  problems with bleeding or clotting disorder.  No anemia.   PHYSICAL EXAMINATION:  GENERAL:  He is a well-developed Caucasian male  in no acute distress.  VITAL SIGNS:  Blood pressure 127/83, pulse 88, respirations 18.  Temperature 97%.  HEENT:  Head atraumatic.  Eyes:  Pupils are equal, round, reactive to  light and accommodation.  Extraocular movements are normal, sclerae  anicteric.  Ears:  Tympanic membranes are intact.  Nose:  There is no  septal deviation. Mouth:  Without lesions.  Uvula midline.  Moist  membranes.  NECK:  Supple, without thyromegaly.  No JVD, no supraclavicular or  axillary adenopathy.  CHEST:  Clear to auscultation and percussion.  HEART:  Regular sinus rhythm.  No murmurs.  ABDOMEN:  Soft.  No hepatosplenomegaly.  EXTREMITIES:  Pulses 2+.  There is no clubbing or edema.  NEUROLOGIC:  He is oriented x3.  Sensory and motor intact.  Cranial  nerves intact.   IMPRESSION:  1. Non-Hodgkin's lymphoma in remission, status post chemotherapy.  2. Left lower lobe superior segmental lesion.  3. Hypertension.   PLAN:  Left VATS lower lobe superior segmentectomy.      Ines Bloomer, M.D.  Electronically Signed     DPB/MEDQ  D:  12/15/2006  T:  12/16/2006  Job:  [id number]

## 2010-05-26 NOTE — Letter (Signed)
April 05, 2007   Genene Churn. Cyndie Chime, M.D.  [address] [facility]  [place], [place] [address]   Re:  BAYLEE, MCCORKEL                 DOB:  1945-10-18   Dear Dr. Rosanne Ashing,   I saw Mr. Caligiuri back today.  He is doing better as far as his pain for  a post thoracotomy.  His blood pressure is 130/80.  Pulse 78.  Respirations 18.  Sats were 98%.  I gave him a refill for Celebrex to  take p.r.n. and I will see him back again in 3 months with a chest x-ray  for a final check.   Sincerely,   Ines Bloomer, M.D.  Electronically Signed   DPB/MEDQ  D:  04/05/2007  T:  04/05/2007  Job:  [id number]
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
## 2010-05-26 NOTE — Assessment & Plan Note (Signed)
OFFICE VISIT   Anthony Kelly, Anthony Kelly  DOB:  [birth year]                                        January 19, 2007  CHART #:  [id number]   Anthony Kelly came today. His blood pressure is 113/75, pulse 85,  respirations 18, and saturations were 95%. He is complaining of some  reflux and regurgitation. He does have a large hiatal hernia. His  incisions were well healed. His lungs were clear to auscultation and  percussion. Heart was in regular sinus rhythm.   From a surgical standpoint, he is doing well. I did tell him that he  should check with Dr. Cyndie Chime about possible treatment of his  regurgitation. If he does not, to let us know and we will refer him to a  gastroenterologist.   Ines Bloomer, M.D.  Electronically Signed   DPB/MEDQ  D:  01/19/2007  T:  01/20/2007  Job:  [id number]

## 2010-05-26 NOTE — Discharge Summary (Signed)
Anthony Kelly, Anthony Kelly               ACCOUNT NO.:  [id number]   MEDICAL RECORD NO.:  [id number]          PATIENT TYPE:  OUT   LOCATION:  CARD                         FACILITY:  [facility]   PHYSICIAN:  Ines Bloomer, M.D. DATE OF BIRTH:  September 15, 1945   DATE OF ADMISSION:  12/09/2006  DATE OF DISCHARGE:  12/09/2006                               DISCHARGE SUMMARY   FINAL DIAGNOSIS:  Right lower lobe mass, lymphoma.  History of non-Hodgkin's lymphoma.   SECONDARY DIAGNOSIS:  Hypertension.   IN-HOSPITAL OPERATIONS AND PROCEDURES:  Right video-assisted  thoracoscopic surgery with right lower lobe superior segmentectomy.   HISTORY AND PHYSICAL AND HOSPITAL COURSE:  The patient is a 65 year old  male who quit smoking about 2 years ago.  He has been treated for non-  Hodgkin's lymphoma with reductive and CHOP therapy with active response.  Follow-up CT scan in April 2007 looked good.  Recent CT scan showed a  lesion in the superior segmental of the right lower lobe.  PET scan done  showed to be positive with an SUV uptake of 2.7.  Pulmonary function  tests showed an FVC of 3.60 with an FEV-1 of 2.  The patient denies  hemoptysis, fever, chills, excessive sputum.  The patient was seen and  evaluated by Dr. Edwyna Shell.  Dr. Edwyna Shell discussed with the patient  undergoing right thoracotomy with superior segmentectomy of the right  lower lobe lesion.  He discussed risks and benefits with the patient.  The patient acknowledged understanding and agreed to proceed.  Surgery  was scheduled for December 19, 2006.   For details of the patient's past medical history and physical exam  please see dictated H&P.   The patient was taken to the operating room December 19, 2006, where he  underwent right video-assisted thoracoscopic surgery with right  thoracotomy, right lower lobe superior segmentectomy.  The patient  tolerated this procedure was transferred to the intensive care unit in  stable condition.   Postoperatively the patient was extubated without  difficulty.  He was noted to be alert and oriented x4.  Neurologically  intact.  He was noted to be hemodynamically stable postoperatively.  The  patient's pathology report came back positive showing lymphoma.  The  patient's postoperative course was pretty much unremarkable.  Chest x-  rays obtained daily showed to be stable with a small right apical  pneumothorax.  The patient had no air leaks noted.  By postop day #2,  chest tube was discontinued.  Follow-up chest x-ray remained stable with  persistent right small apical pneumothorax.  During the patient's  postoperative course, vital signs were followed.  He remained afebrile.  All vital signs remained stable.  He was able to be weaned off oxygen  sating greater than 90% on room air.  Postoperatively the patient was  noted to be in normal sinus rhythm.  Pulmonary status was stable.  He  continued to use his incentive spirometer.  All incisions were clean,  dry and intact and healing well.  The patient was out of bed ambulating  well without difficulty.  He was tolerating diet  well.  No nausea,  vomiting noted.   LABORATORY DATA:  On postop day #2 showed a white count 10.8, hemoglobin  of 40, hematocrit 39.7, platelet count 149.  Sodium was 137, potassium  3.8, chloride of 100, bicarb of 31, BUN of 10, creatinine of 1.02 and  glucose of 138.  Chest x-ray December 11 showed persistent stable right  apical pneumothorax with mild bilateral atelectasis.   The patient was progressing well and felt to be stable and ready for  discharge home postop day #3, December 22, 2006.   FOLLOW-UP APPOINTMENTS:  Follow-up appointment will be arranged with Dr.  Edwyna Shell for one week.  Our office will contact the patient with this  information.  The patient will need to obtain PMI chest x-ray 30 minutes  prior to this appointment.   DISCHARGE INSTRUCTIONS:  1. Activity:  Patient instructed no driving  until released to do so,      no lifting over 10 pounds.  He is told to ambulate 3-4 times per      day, progress as tolerated and continue breathing exercises.  2. Incisional care.  The patient is told to shower washing his      incisions using soap and water.  He is to contact the office if he      develops any drainage or opening from any of his incision sites.  3. Diet.  The patient educated on diet to be low-fat, low-salt.   DISCHARGE MEDICATIONS:  1. Aspirin 325 mg daily.  2. Metoprolol 25 mg daily.  3. Levitra as needed.  4. Omega-3 2000 mg b.i.d..  5. Multivitamin daily.  6. Relafen 500 mg b.i.d..  7. Vitamin B12 1000 mL daily.  8. Percocet 5/325 1-2 tablets q. for 6 hours p.r.n. pain.      Theda Belfast, [place]      Ines Bloomer, M.D.  Electronically Signed    KMD/MEDQ  D:  12/22/2006  T:  12/22/2006  Job:  [id number]   cc:   Ines Bloomer, M.D.

## 2010-05-26 NOTE — Assessment & Plan Note (Signed)
OFFICE VISIT   ROMIE, TAY  DOB:  28-Sep-1945                                        December 29, 2006  CHART #:  [id number]   HISTORY OF PRESENT ILLNESS:  The patient is now approximately 1 week  status post his right video-assisted thoracoscopic surgery for right  lower lobe superior segmentectomy.  The pathology has revealed a  follicular lymphoma grade III of III at approximately 2.8 cm.  Lymphoma  did involve the visceral pleura at the parenchymal resection margin.  One level 12 L node with sinus histiocytosis and anthracotic pigments,  for full details please see the dictated pathology report.  He is seen  on today's date for surgical followup.  He reports that overall he is  having only mild difficulty with shortness of breath.  He is having  moderate pain but he appears to be tolerating that with, he states, only  Tylenol because the pain medication was difficult for him to tolerate.  He is slowly resuming his activities over time and he does have his  lifting restrictions.  A chest x-ray was done on today's date which  revealed improvement in the bibasilar atelectasis.  The patient is  scheduled to see Dr. Cyndie Chime, he believes, on January 9 for  discussion of the next plan of care.   PHYSICAL EXAMINATION:  Blood pressure is 118/80, heart rate 84,  respirations 16, oxygen saturation 97% on room air.  Pulmonary:  Lungs  are clear to auscultation.  Cardiac:  Regular rate and rhythm without  murmurs, gallops or rubs.  Abdominal:  Soft, nontender, benign.  Extremities:  No edema.  Incisions all healing well without evidence of  infection.   ASSESSMENT:  The patient is showing a steady improvement  postoperatively.  He was seen by Dr. Edwyna Shell on today's date, he plans to  see him in followup in 3 weeks with a chest x-ray at that  time.  In the meantime he has been told he can drive and lift up to 20  pounds weight.  His followup with Dr.  Cyndie Chime will be arranged.   Rowe Clack, P.A.-C.   Sherryll Burger  D:  12/29/2006  T:  12/30/2006  Job:  [id number]   cc:   Ines Bloomer, M.D.  Genene Churn. Cyndie Chime, M.D.

## 2010-05-26 NOTE — Letter (Signed)
December 06, 2006   Genene Churn. Cyndie Chime, M.D.  [address] [facility]  [place]  [place] [address]   Re:  LADON, VANDENBERGHE                 DOB:  Feb 07, 1945   Dear Rosanne Ashing:   I appreciate the opportunity of seeing Mr. Hart Rochester. This 65 year old  patient quit smoking 2 years ago. He has a long history of smoking. 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 He was followed with CT scan  and CT scan done in April of 2007 looked good but a followup CT scan of  this year showed a lesion in the superior segment of the left lower  lobe. PET scan was positive with an SUV of 2.7. There was no uptake  elsewhere and this was thought to either be a low grade cancer versus an  inflammatory process. The lesion was available a year ago. His screening  lung function test showed an FVC of 3.62 with an FEV1 of 0.95, which  does not really make sense, so we are going to repeat those scans, since  he quit smoking many yeas ago. He has had a recent episode of  bronchitis. No hemoptysis, fever, chills, or excessive sputum. He is  considered to be in complete remission from his high grade non-Hodgkin's  lymphoma.   PAST MEDICAL HISTORY:  He has been treated for hypertension.   MEDICATIONS:  He takes nabumetone 500 mg b.i.d., aspirin 325 mg daily,  Metoprolol 25 mg daily.   FAMILY HISTORY:  Positive for cardiac disease.   SOCIAL HISTORY:  He is married with 2 children. Works in Production designer, theatre/television/film at  [employer]. Quit smoking in 1994. Does not drink alcohol on a regular  basis.   REVIEW OF SYSTEMS:  VITAL SIGNS:  He has weight loss. His weight has  been stable. He is 200 pounds and 6 foot 1. CARDIAC:  No angina or  atrial fibrillation.  PULMONARY:  No hemoptysis, fever, or chills. GASTROINTESTINAL:  No  nausea, vomiting, constipation, diarrhea, or GERD. GENITOURINARY:  No  kidney disease, dysuria, or frequent urination. EXTREMITIES:  No  claudication, DVT or TIA's. NEUROLOGIC:  No headaches, blackouts,  seizures, or dizziness. MUSCULOSKELETAL:  No arthritis. No psychiatric  illnesses. HEENT:  No changes in eye sight or hearing. HEMATOLOGIC:  No  positive bleeding or clotting disorder.   PHYSICAL EXAMINATION:  GENERAL:  A well developed, Caucasian male in no  acute distress.  HEENT:  Unremarkable.  VITAL SIGNS:  Blood pressure 127/83, pulse 88, respiratory rate 18.  Saturations are 97%.  LUNGS:  Clear to auscultation and percussion.  HEART:  Regular sinus rhythm. No murmurs.  ABDOMEN:  Soft. There is no hepatosplenomegaly.  EXTREMITIES:  Pulses 2+. There is no clubbing or edema.  NEUROLOGIC:  Oriented x3. Sensory and motor intact. Cranial nerves  intact.   IMPRESSION:  I feel that he has a new lesion in the superior segment of  the right lower lobe. I feel his pulmonary function tests are adequate  for a right lower lobe segmentectomy and I have recommended that this be  done. He agrees for the surgery and we have tentatively scheduled it for  December 19, 2006. Will repeat his pulmonary function tests prior to  surgery, to be sure I have a full set including DL CL.   Ines Bloomer, M.D.  Electronically Signed   DPB/MEDQ  D:  12/06/2006  T:  12/06/2006  Job:  [id number]

## 2010-05-29 NOTE — Op Note (Signed)
NAME:  Anthony Kelly, Anthony Kelly               ACCOUNT NO.:  [id number]   MEDICAL RECORD NO.:  [id number]          PATIENT TYPE:  AMB   LOCATION:  DAY                          FACILITY:  [facility]   PHYSICIAN:  Gita Kudo, M.D. DATE OF BIRTH:  1945/06/06   DATE OF PROCEDURE:  12/10/2003  DATE OF DISCHARGE:                                 OPERATIVE REPORT   OPERATIVE PROCEDURE:  Excisional biopsy lymph nodes, (three), left groin.   SURGEON:  Dr. Maryagnes Amos.   ANESTHESIA:  General.   PREOPERATIVE DIAGNOSIS:  Abnormal left groin lymph nodes.   POSTOPERATIVE DIAGNOSIS:  Abnormal left groin lymph nodes, pending  pathology.   CLINICAL SUMMARY:  A 65 year old male with history of abnormal lymph nodes.  He had these noted on a study prior to a laparoscopic gallbladder in [place].  He then had a biopsy of his left groin nodes in [place], [place].  This was a few years ago and at that time, they were unable to give a  specific diagnosis.  Supposedly the tissue had been sent to several  different areas and the consultants could not agree that it was a lymphoma.  Because of the swelling in the left groin has increased and physical  examination shows abnormal-feeling node, he comes in for surgery.   OPERATIVE FINDINGS:  There were multiple semi matted lymph nodes in his left  groin.  I removed one real large one and two smaller ones.  There were  multiple others left behind that looked the same and therefore I felt I had  representative samples.  There was no injury to any major nerve or vessel.   OPERATIVE PROCEDURE:  Under satisfactory general anesthesia, the patient's  left groin was prepped and draped in a standard fashion.  An oblique  incision was made and carried down into the subcutaneous.  The lymph nodes  are actually in the superficial space and superficial to the fascia.  The  nodes were exposed and then carefully dissected using cautery for hemostasis  which was excellent.  After the  nodes were removed, one bleeding area was  oversewn with 3-0 Vicryl and the wound lavaged with saline.  Infiltrated  with 40 cc of 0.25% Marcaine with epinephrine, then  the wound was closed in layers with 3-0 Vicryl followed by Steri-Strips.  A  sterile absorbent compressive dressing was applied.  The lymph node was sent  to the laboratory in saline.  I talked to the pathologist and it will  undergo a lymphoma work up.  Copies will be sent to Dr. Riley Churches.      MRL/MEDQ  D:  12/10/2003  T:  12/10/2003  Job:  [id number]   cc:   Genene Churn. Cyndie Chime, M.D.  [address] [facility]  [place]  [place] [address]  Fax: [phone]

## 2010-05-29 NOTE — Consult Note (Signed)
NAMEGRAHM, ETSITTY NO.:  [id number]   MEDICAL RECORD NO.:  [id number]          PATIENT TYPE:  INP   LOCATION:  3728                         FACILITY:  [facility]   PHYSICIAN:  Corky Crafts, MDDATE OF BIRTH:  1945/09/28   DATE OF CONSULTATION:  DATE OF DISCHARGE:                                   CONSULTATION   REFERRING PHYSICIAN:  Doug Sou, M.D.   HISTORY OF PRESENT ILLNESS:  The patient is a 65 year old who had an episode  of dizziness earlier today.  He felt his heart racing and nearly passed out  but did not experiencing syncope.  He felt a slightly irregular heartbeat  last night.  However, he did not have any symptoms of dizziness during the  day today.  This episode lasted for approximately 30 minutes and the  dizziness waxed and waned.  He had to squat to the ground to experience any  relief from his dizziness.  By the time EMS came, his symptoms were still  there but slightly improving.  Currently, he feels well.  He does not have  any chest pain, shortness of breath nor did he during the episode earlier  today.   PAST MEDICAL HISTORY:  None.  Hodgkin's lymphoma in the past.  He has  prostate problems.   PAST SURGICAL HISTORY:  He has had abdominal exploratory surgery, hernia,  port-a-cath placement, vein stripping in his legs.   MEDICATIONS:  Lamisil, Relafen, Flomax and Proscar.   ALLERGIES:  No known drug allergies.   FAMILY HISTORY:  Heart disease does run in his family.   SOCIAL HISTORY:  The patient quit smoking 13 years ago.  He does not drink.  He does use any other drugs.  He takes only vitamins.  He had a stress test  approximately four years ago which did not show any evidence of ischemia.   REVIEW OF SYSTEMS:  Positive just for palpitations.  No chest pain.  No  shortness of breath.  No nausea or vomiting.  No focal weakness.  Just the  episode of generalized weakness that he reported.  All other systems  negative.   PHYSICAL EXAMINATION:  VITAL SIGNS:  Heart rate currently 86, blood pressure  122/78.  GENERAL:  The patient is awake and alert.  HEAD:  Normocephalic and atraumatic.  EENT:  Eyes with extraocular movements intact.  Sclera nonicteric.  Mouth:  Oropharynx clear.  CARDIOVASCULAR:  Regular rate and rhythm.  S1, S2, no murmurs, rubs, or  gallops.  LUNGS:  Clear to auscultation bilaterally.  ABDOMEN:  Soft, nontender, nondistended.  EXTREMITIES:  No clubbing, cyanosis, or edema, 2+ dorsalis pedis pulses  bilaterally.  NEUROLOGIC:  No focal weakness.  PSYCHIATRIC:  Normal mood and affect.  BACK:  Showed no point tenderness.   LABORATORY DATA:  Pending.  Rhythm strip, there appears to be sinus  tachycardia which gradually slows to normal sinus rhythm on rhythm strip.  However, on another rhythm strip, there are three beats of irregular  tachycardia seen which could represent atrial fibrillation.  However, these  could just be PACs which  are strung together as sinus rhythm is quickly  restored.   ASSESSMENT/PLAN:  1.  Cardiac tachycardia on strip is noted, most of which is sinus      tachycardia.  There is a three beat area of slightly irregular      tachycardia which could just be PACs strung together.  Will check      echocardiogram to look for evidence of structural heart disease.  Will      also check cardiac enzymes.  2.  The patient will be admitted to telemetry bed.  3.  There are other parts of a syncope workup which is being performed.  If      it is just sinus tachycardia which the patient had, I doubt he would be      presyncopal from this.  4.  I will follow the patient closely.           ______________________________  Corky Crafts, MD     JSV/MEDQ  D:  10/24/2004  T:  10/24/2004  Job:  [id number]

## 2010-05-29 NOTE — Discharge Summary (Signed)
NAMEKORBIN, MAPPS               ACCOUNT NO.:  [id number]   MEDICAL RECORD NO.:  [id number]          PATIENT TYPE:  INP   LOCATION:  3728                         FACILITY:  [facility]   PHYSICIAN:  Mobolaji B. Bakare, M.D.DATE OF BIRTH:  [birth year]   DATE OF ADMISSION:  10/24/2004  DATE OF DISCHARGE:  10/25/2004                                 DISCHARGE SUMMARY   PRIMARY CARE PHYSICIAN:  The patient is unassigned.   CARDIOLOGY CONSULTS:  Dr. Eldridge Dace.   FINAL DIAGNOSES:  1.  Presyncope.  2.  History of non-Hodgkin's lymphoma.  3.  Benign prostatic hypertrophy.   BRIEF HISTORY:  Mr. Bitting is a pleasant 65 year old Caucasian male with no  known cardiac disease.  He has a history of non-Hodgkin's lymphoma.  He  presented to the emergency department on the day of admission because he  developed light-headedness while he was fixing a furnace at work and these  were associated with diaphoresis and shortness of breath.  He felt like  passing out and there was also palpitation.  He did not have any chest pain,  no nausea or vomiting, tingling sensation.  He had EKG done in the emergency  department.  The rhythm strip showed a regular tachycardia, premature atrial  contractions.  He was evaluated by cardiologist, Dr. Eldridge Dace.  The patient  needed to be admitted for further evaluation.   HOSPITAL COURSE:  He was admitted onto telemetry.  There was no abnormal  cardiac arrhythmia noted while he was on telemetry.  He ruled out for  myocardial infarction with three negative troponins.  He did not have any  chest pain.  The patient was again evaluated by cardiology, and it was felt  that he could have outpatient 2-D echocardiogram and Holter monitoring.  He  was discharged to follow up with Dr. Eldridge Dace for 2-D echocardiogram and  Holter monitor.   DISCHARGE MEDICATIONS:  1.  Aspirin 325 mg once a day.  2.  Proscar 5 mg p.o. daily.  3.  Flomax 0.4 mg p.o. daily.  4.  Relafen 500 mg two  tablets p.o. b.i.d.  5.  Terbinafine 250 mg p.o. daily.   CONDITION ON DISCHARGE:  The patient was discharged home in a stable  condition.      Mobolaji B. Corky Downs, M.D.  Electronically Signed    MBB/MEDQ  D:  11/04/2004  T:  11/05/2004  Job:  [id number]

## 2010-05-29 NOTE — H&P (Signed)
Anthony Kelly, Anthony Kelly NO.:  [id number]   MEDICAL RECORD NO.:  [id number]          PATIENT TYPE:  INP   LOCATION:  3728                         FACILITY:  [facility]   PHYSICIAN:  Marcie Mowers, M.D.DATE OF BIRTH:  16-Nov-1945   DATE OF ADMISSION:  10/24/2004  DATE OF DISCHARGE:                                HISTORY & PHYSICAL   PRIMARY CARE PHYSICIAN:  In [place], so the patient is unassigned here.   REASON FOR ADMISSION:  A presyncopal episode.   HISTORY OF PRESENT ILLNESS:  This is a 65 year old Caucasian male with a  past medical history of non-Hodgkin's lymphoma diagnosed in November of  2005, who has been on chemotherapy, with the last chemotherapy cycle in  April 2006 by Dr. Cyndie Chime.  He was in his usual state of health until  this morning when he was at his work and was fixing a furnace when he felt  lightheaded and started perspiring heavily and felt short of breath and felt  like he was passing out.  He presented to the emergency department because  of this episode.   He denies having any chest pains.  No nausea or vomiting.  He did get  diaphoretic during the episode.  He also felt short or breath.  At present,  all the symptoms have resolved.  He does not have any headache or changes in  his vision.  He denies any orthopnea or PND.  No history of fevers.  No  history of upper respiratory infection recently.  No history of cough with  sputum production.  He does not have any abdominal complaints.  No dysuria.  He does have a history of some difficulty with urination secondary to his  BPH.  No change in his bowel habits.  He denies noticing any swelling in his  legs.   PAST MEDICAL HISTORY:  1.  Known history of lymphoma, diagnosed in November of 2005.  Last      chemotherapy in April of 2006 by Dr. Cyndie Chime.  2.  BPH.  3.  Onychomycosis of his toes.  4.  Low back pain.   PAST SURGICAL HISTORY:  Cholecystectomy in 2003.    MEDICATIONS:  1.  He takes Flomax 0.4 mg daily.  2.  Proscar 5 mg daily.  3.  Lamisil 25 mg tablet daily.  4.  __________ 500 mg four times a day for his back pain.  5.  Multivitamins.   ALLERGIES:  No known drug allergies.   SOCIAL HISTORY:  He was a former smoker.  He quite 12-13 years ago.  He  smoked approximately for 25 years.  No alcohol or illicit drug use.  He is  married and lives with his wife.  He works.   FAMILY HISTORY:  Significant for myocardial infarctions in of his family.  His father passed away in his 42s secondary to an MI; the first episode of  myocardial infarction in the early 73s.  He has 4 brothers with myocardial  infarctions in their 36s.  A sister with a history of a myocardial  infarction, as well.  REVIEW OF SYSTEMS:  As mentioned in the history of present illness.   PHYSICAL EXAMINATION:  GENERAL APPEARANCE:  This is a pleasant Caucasian  male who appears his age.  He does not appear to be in acute distress.  VITAL SIGNS:  Blood pressure is 125/76, pulse 95 per minute and is regular,  respirations 22 per minute, temperature 96.9 degrees.  He is saturating at  98% on room air.  HEENT:  Head is atraumatic, normocephalic.  Pupils equal, round and reactive  to light.  Extraocular muscles are intact.  Oral mucosa moist.  Pharynx  appears normal.  NECK:  Supple.  No jugular venous distention.  No carotid bruit.  No  thyromegaly.  No lymphadenopathy.  CARDIOVASCULAR:  S1 and S2 is normal.  Rate and rhythm are regular.  No  murmur is appreciated.  PULMONARY:  Clear to auscultation.  Air entry seems equal bilaterally.  CHEST:  There is no palpation tenderness.  ABDOMEN:  Soft, nontender, nondistended.  Normally active bowel sounds in  all 4 quadrants.  EXTREMITIES:  The lower extremities are without any clubbing, cyanosis, or  edema.  NEUROLOGIC:  Cranial nerves III-XII are intact.  Motor strength is 5/5 in  all extremities.  DTRs are 2+ all over.  His  gait is normal.  His cerebellar  exam is normal.  There is no sensory deficit.  Plantars are downgoing  bilaterally.  Overall, the neurological exam appears to be within normal  limits.   LABORATORY DATA:  White count is 9.3.  Differential is not available.  Hemoglobin 15.7, hematocrit 44.9, platelets 152,000.  Sodium is 139,  potassium 4.1, chloride 108, BUN 18, and creatinine of 0.9.  The first set  of cardiac enzymes is negative with troponin I of less than 0.05.  CK-MB is  less than 1, myoglobin is 111.  CT scan of the head is pending.  A 12-lead  ECG shows a normal sinus rhythm, and heart rate of 91 per minute.  There are  2 strips from the emergency medical services that show at one point an  irregular heart beat where the P waves are not very visible.  On the other  strip, there is tachycardia, but the rhythm appears to be regular.   ASSESSMENT AND PLAN:  1.  This is a 65 year old otherwise healthy Caucasian male with a      presyncopal episode.  Not clear exactly what the rhythm is on the rhythm      strips provided by the emergency medical services.  This does not appear      to be atrial fibrillation.  The 12-lead EKG done in the emergency      department showed normal sinus rhythm.  At this time, I will admit the      patient to telemetry, obtain a CT scan of the head.  Will order a 2-D      echo, as well.  For completeness, I will also cycle the cardiac enzymes      and obtain a repeat 12-lead EKG in the morning.  Carotid Dopplers can be      obtain if deemed necessary.  A cardiology consult was already obtained      by the emergency department physician.  2.  Benign prostatic hypertrophy.  I will resume his Proscar and Flomax.  3.  A history of non-Hodgkin's lymphoma, currently in remission.  4.  Prophylaxis.  I will start him on Lovenox and Protonix.  ______________________________  Marcie Mowers, M.D.    PMJ/MEDQ  D:  10/24/2004  T:  10/25/2004   Job:  [id number]

## 2010-09-30 LAB — DIFFERENTIAL
Eosinophils Absolute: 0.4
Lymphocytes Relative: 15
Lymphs Abs: 1.2
Monocytes Relative: 9
Neutrophils Relative %: 71

## 2010-09-30 LAB — CHROMOSOME ANALYSIS, BONE MARROW

## 2010-09-30 LAB — TISSUE HYBRIDIZATION (BONE MARROW)-NCBH

## 2010-09-30 LAB — CBC
Hemoglobin: 17
MCHC: 34.7
RBC: 5.52
WBC: 7.7

## 2010-10-02 LAB — COMPREHENSIVE METABOLIC PANEL
AST: 24
CO2: 29
Calcium: 9.1
Creatinine, Ser: 0.99
GFR calc Af Amer: >60
GFR calc non Af Amer: >60
Glucose, Bld: 103 — ABNORMAL HIGH
Sodium: 139
Total Protein: 6

## 2010-10-02 LAB — CBC
MCHC: 34.7
MCV: 86.6
RBC: 4.99
RDW: 12.4

## 2010-10-02 LAB — DIFFERENTIAL
Basophils Absolute: 0
Basophils Relative: 1
Eosinophils Absolute: 0.2
Monocytes Relative: 10
Neutro Abs: 4.9
Neutrophils Relative %: 71

## 2010-10-02 LAB — LACTATE DEHYDROGENASE: LDH: 142

## 2010-10-19 LAB — COMPREHENSIVE METABOLIC PANEL
ALT: 31
ALT: 35
AST: 29
AST: 33
Albumin: 4
Alkaline Phosphatase: 51
CO2: 31
Chloride: 100
GFR calc Af Amer: >60
GFR calc Af Amer: >60
GFR calc non Af Amer: >60
Potassium: 4.5
Sodium: 136
Sodium: 137
Total Bilirubin: 1
Total Protein: 6.3

## 2010-10-19 LAB — TYPE AND SCREEN: Antibody Screen: NEGATIVE

## 2010-10-19 LAB — BLOOD GAS, ARTERIAL
O2 Content: 4
Patient temperature: 98.6
pCO2 arterial: 37.8
pCO2 arterial: 41.6
pH, Arterial: 7.392
pH, Arterial: 7.445

## 2010-10-19 LAB — CBC
HCT: 39.8
Platelets: 167
Platelets: 181
RBC: 4.48
RDW: 12.8
WBC: 10.8 — ABNORMAL HIGH
WBC: 11.4 — ABNORMAL HIGH
WBC: 7.5

## 2010-10-19 LAB — BASIC METABOLIC PANEL
BUN: 14
Creatinine, Ser: 1.04
GFR calc non Af Amer: >60
Potassium: 3.4 — ABNORMAL LOW

## 2010-10-19 LAB — ABO/RH: ABO/RH(D): B POS

## 2010-10-19 LAB — PROTIME-INR: INR: 1

## 2010-10-20 ENCOUNTER — Other Ambulatory Visit: Payer: Self-pay | Admitting: Oncology

## 2010-10-20 ENCOUNTER — Ambulatory Visit (HOSPITAL_COMMUNITY)
Admission: RE | Admit: 2010-10-20 | Discharge: 2010-10-20 | Disposition: A | Discharge status: Home / Self Care | Payer: Medicare Other | Source: Ambulatory Visit | Attending: Oncology | Admitting: Oncology

## 2010-10-20 ENCOUNTER — Encounter: Payer: Medicare Other | Admitting: Oncology

## 2010-10-20 DIAGNOSIS — K5730 Diverticulosis of large intestine without perforation or abscess without bleeding: Secondary | ICD-10-CM | POA: Insufficient documentation

## 2010-10-20 DIAGNOSIS — C8589 Other specified types of non-Hodgkin lymphoma, extranodal and solid organ sites: Secondary | ICD-10-CM | POA: Insufficient documentation

## 2010-10-20 DIAGNOSIS — K7689 Other specified diseases of liver: Secondary | ICD-10-CM | POA: Insufficient documentation

## 2010-10-20 DIAGNOSIS — C8291 Follicular lymphoma, unspecified, lymph nodes of head, face, and neck: Secondary | ICD-10-CM

## 2010-10-20 DIAGNOSIS — N281 Cyst of kidney, acquired: Secondary | ICD-10-CM | POA: Insufficient documentation

## 2010-10-20 DIAGNOSIS — K449 Diaphragmatic hernia without obstruction or gangrene: Secondary | ICD-10-CM | POA: Insufficient documentation

## 2010-10-20 DIAGNOSIS — C8590 Non-Hodgkin lymphoma, unspecified, unspecified site: Secondary | ICD-10-CM

## 2010-10-20 LAB — CMP (CANCER CENTER ONLY)
ALT(SGPT): 35 U/L (ref 10–47)
AST: 30 U/L (ref 11–38)
Alkaline Phosphatase: 57 U/L (ref 26–84)
Calcium: 9 mg/dL (ref 8.0–10.3)
Chloride: 102 mEq/L (ref 98–108)
Creat: 1 mg/dl (ref 0.6–1.2)
Total Bilirubin: 0.8 mg/dl (ref 0.20–1.60)

## 2010-10-20 LAB — CBC WITH DIFFERENTIAL/PLATELET
BASO%: 0.4 % (ref 0.0–2.0)
EOS%: 2.1 % (ref 0.0–7.0)
HCT: 50.6 % — ABNORMAL HIGH (ref 38.4–49.9)
MCH: 30.5 pg (ref 27.2–33.4)
MCHC: 34.3 g/dL (ref 32.0–36.0)
MONO%: 10 % (ref 0.0–14.0)
NEUT%: 67.7 % (ref 39.0–75.0)
RDW: 13 % (ref 11.0–14.6)
lymph#: 1.5 10*3/uL (ref 0.9–3.3)

## 2010-10-20 IMAGING — CT CT CHEST W/ CM
2 of 4 series · 16 of 46 positions shown, 18 images · IV contrast (APPLIED)
Comparison: CT scan [DATE].

CT CHEST

CLINICAL DATA: Lymphoma.

CT CHEST, ABDOMEN AND PELVIS WITH CONTRAST
TECHNIQUE: Multidetector CT imaging of the chest, abdomen and
pelvis was performed following the standard protocol during bolus
administration of intravenous contrast.
Contrast: 100mL OMNIPAQUE IOHEXOL 300 MG/ML IV SOLN

[Series 2: cap with st · axial · 0.73mm/px · z∈[-721,-91]mm · 13 of 138 slices shown, 15 images]
[im 6/138  soft-tissue]
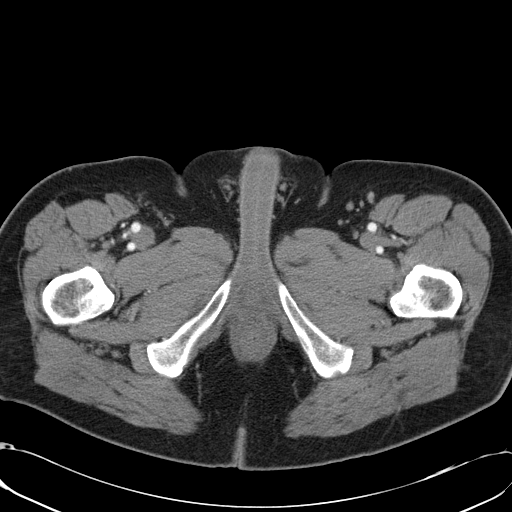
[im 6/138  bone]
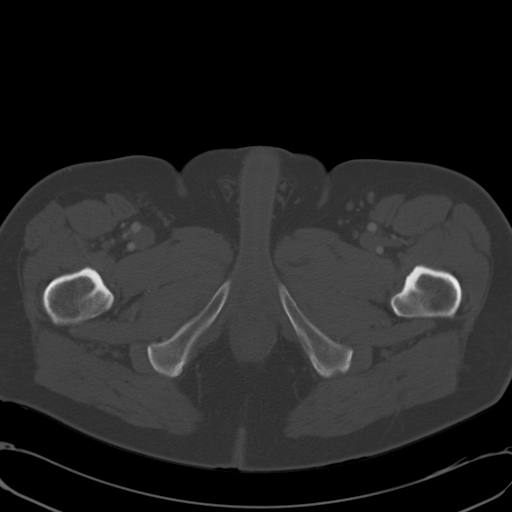
[im 17/138  soft-tissue]
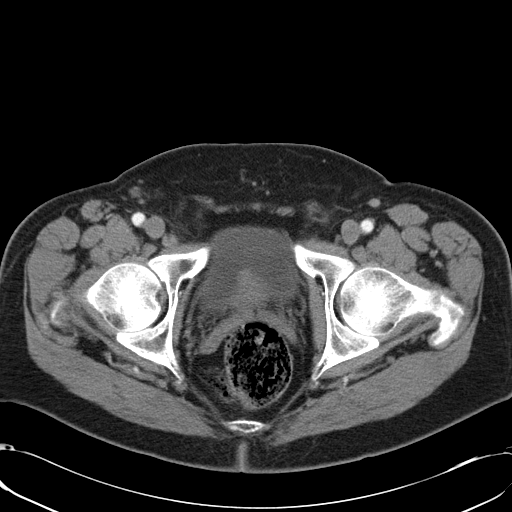
[im 28/138  soft-tissue]
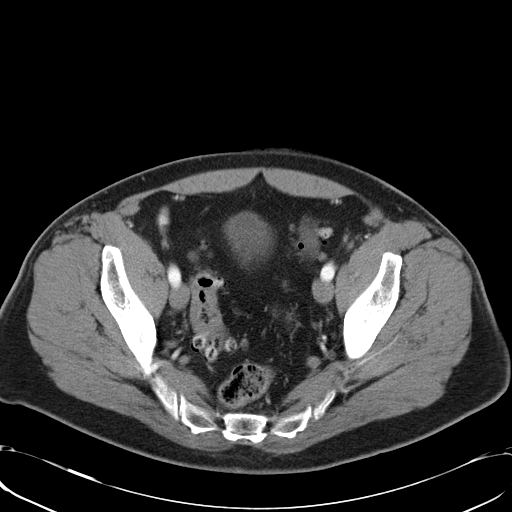
[im 39/138  soft-tissue]
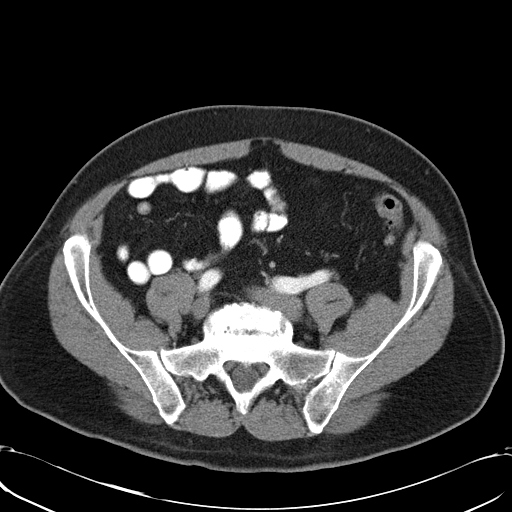
[im 50/138  soft-tissue]
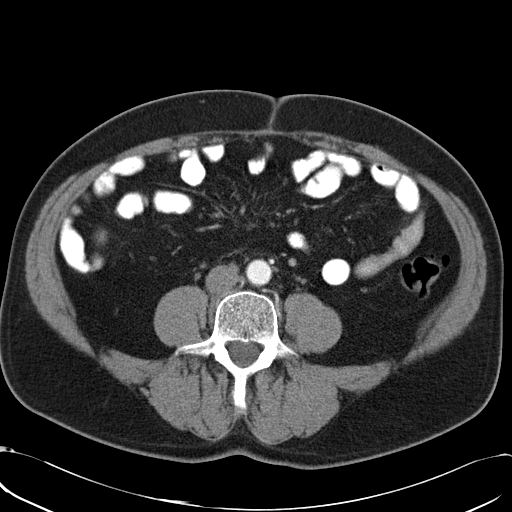
[im 61/138  soft-tissue]
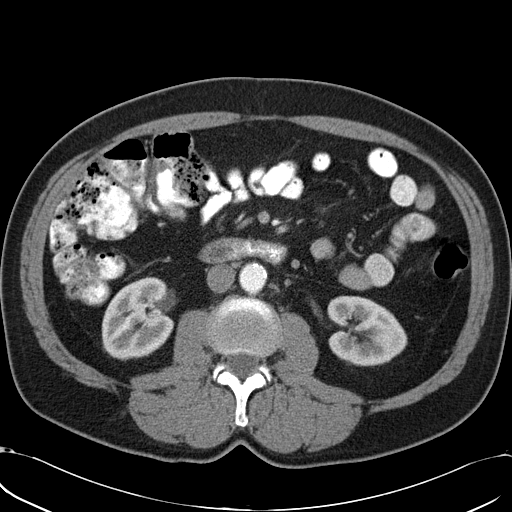
[im 72/138  soft-tissue]
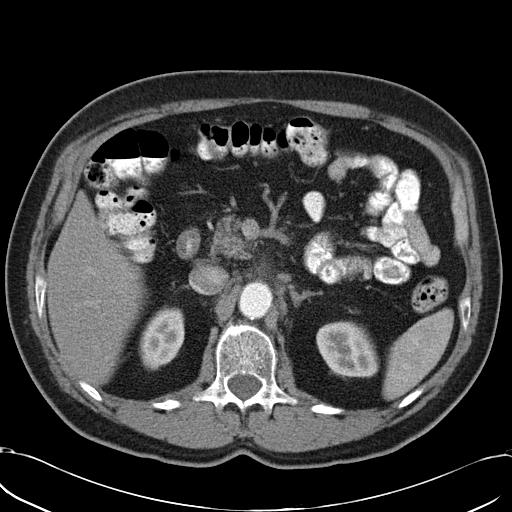
[im 77/138  soft-tissue]
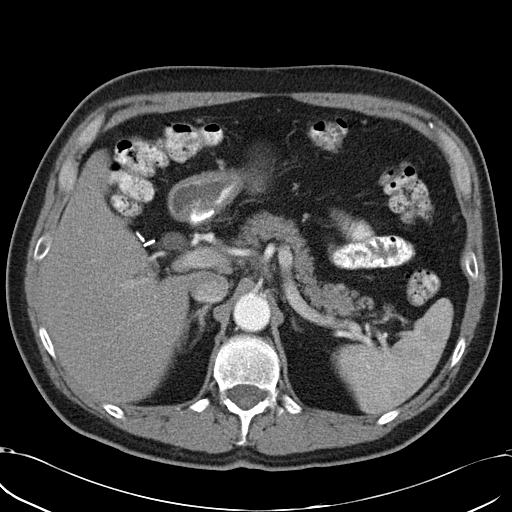
[im 88/138  soft-tissue]
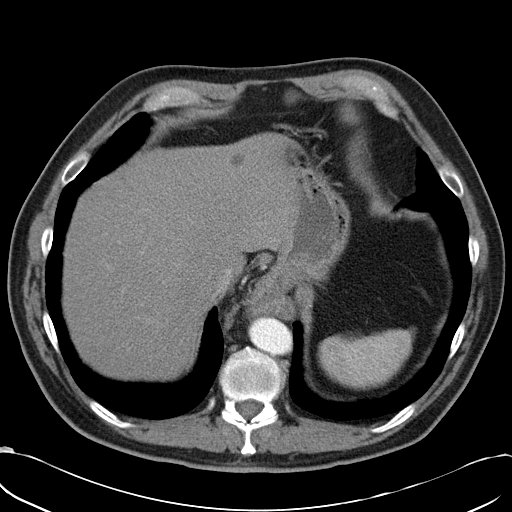
[im 88/138  bone]
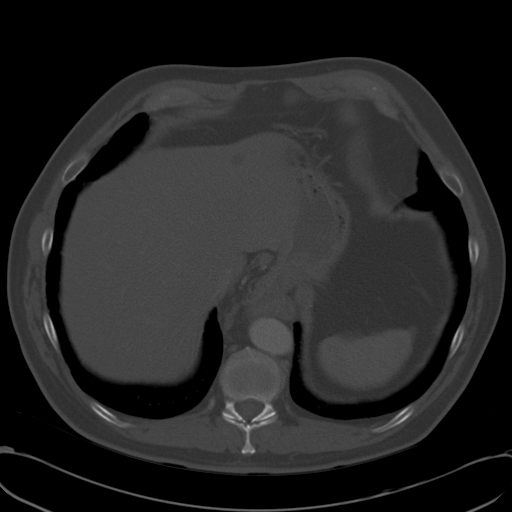
[im 99/138  soft-tissue]
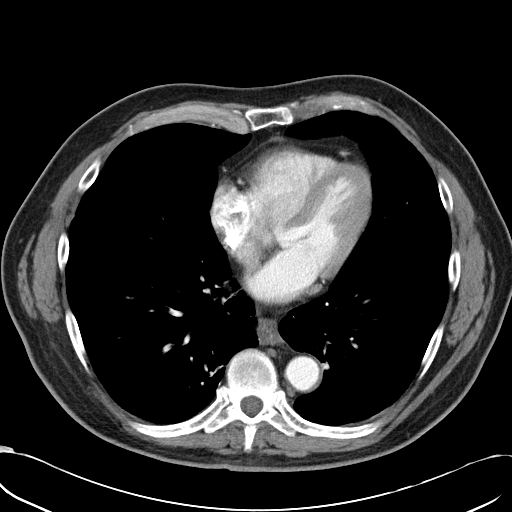
[im 110/138  soft-tissue]
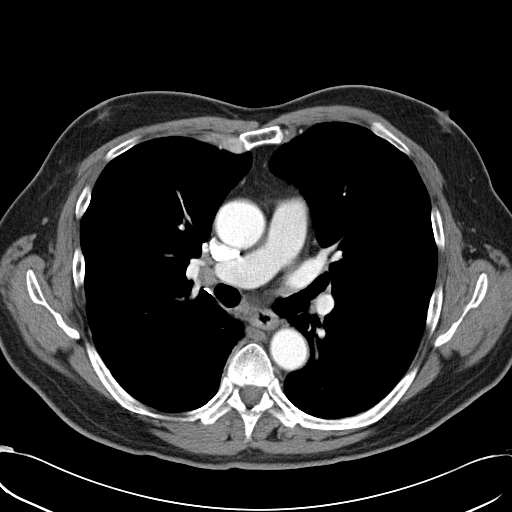
[im 121/138  soft-tissue]
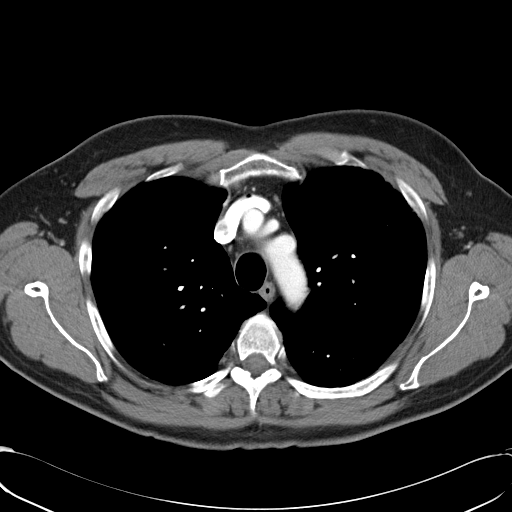
[im 132/138  soft-tissue]
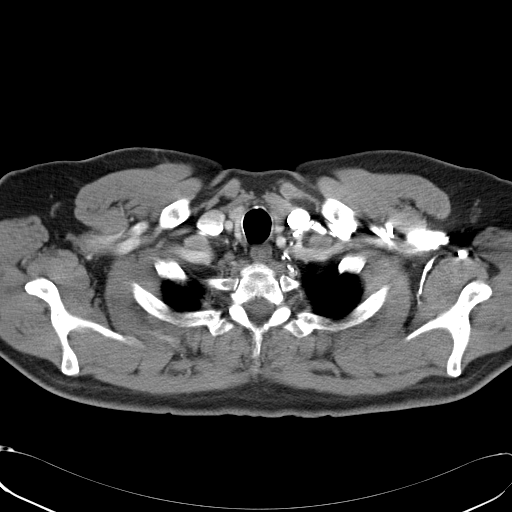

[Series 602: <mpr thick range> · coronal · 1.34mm/px · 3 of 104 slices shown]
[im 35/104  soft-tissue]
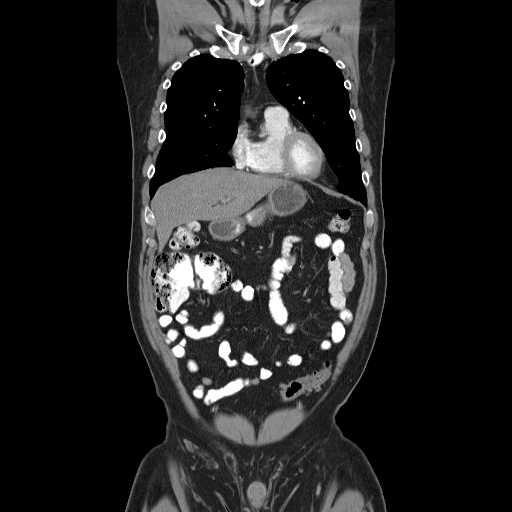
[im 46/104  soft-tissue]
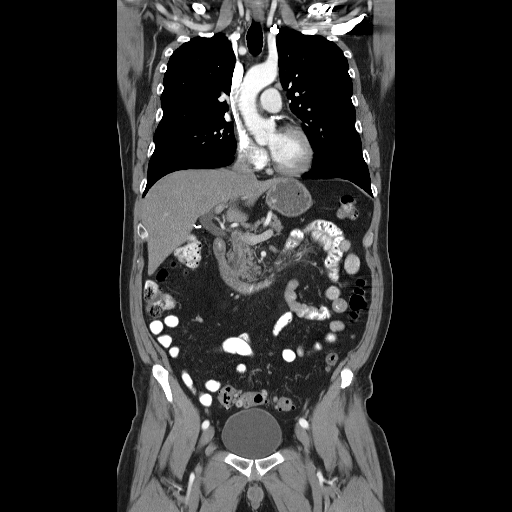
[im 58/104  soft-tissue]
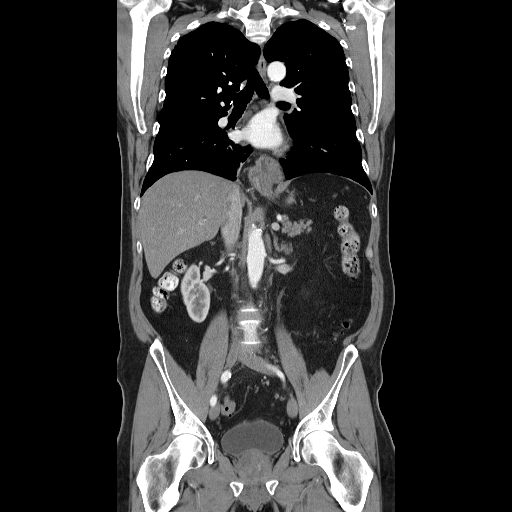

[16 of 46 positions shown; findings below may reference images not displayed]

FINDINGS: The chest wall is unremarkable and stable.  No
supraclavicular or axillary lymphadenopathy.  The bony thorax is
intact.  No destructive bony lesions or spinal canal compromise.
There are dilated nerve root sheaths on the right at T10-11,
bilaterally at T11-12 and on the left at T12-L1.

The heart is normal in size.  No pericardial effusion.  There are
stable small mediastinal and hilar lymph nodes but no adenopathy.
The esophagus is grossly normal.  The aorta is normal in caliber.
No dissection.  Stable coronary artery calcifications.  There is a
large hiatal hernia which is stable.

Examination of the lung parenchyma demonstrates mild stable
emphysematous changes.  No acute pulmonary process.  No pulmonary
nodules or masses.  Stable surgical changes involving the right
lower lobe.
IMPRESSION: 1.  No CT findings for lymphomatous involvement of the chest.
2.  Stable scattered mediastinal and hilar lymph nodes.
3.  Stable large hiatal hernia.
4.  Stable dilated thoracic nerve root sheaths.

CT ABDOMEN AND PELVIS
FINDINGS: There are small stable liver lesions consistent with
benign cysts.  No worrisome hepatic abnormalities.  Mild central
intrahepatic ductal dilatation likely due to prior cholecystectomy.
This is stable.  Mild stable common bile duct dilatation.  The
pancreas is normal and stable.  The spleen is normal in size.  No
focal lesions.  The adrenal glands and kidneys are unremarkable and
stable.  A simple appearing left lower pole renal cyst is again
noted.

The stomach, duodenum, small bowel and colon demonstrate no
significant abnormalities.  Stable sigmoid diverticulosis without
findings for acute diverticulitis.

The aorta demonstrates stable atherosclerotic changes.  The major
branch vessels are patent.  Stable hazy appearance of the fat
around the celiac axis likely due to treated lymphoma.  No enlarged
mesenteric or retroperitoneal lymph nodes to suggest recurrent
disease.

The bladder, prostate gland and seminal vesicles are unremarkable
and stable.  No pelvic lymphadenopathy.  No inguinal
lymphadenopathy.  No inguinal hernia.

The bony structures are unremarkable and stable.
IMPRESSION: No CT findings to suggest recurrent lymphoma.

## 2010-10-20 MED ORDER — IOHEXOL 300 MG/ML  SOLN
100.0000 mL | Freq: Once | INTRAMUSCULAR | Status: AC | PRN
  Administered 2010-10-20: 100 mL via INTRAVENOUS

## 2010-10-27 ENCOUNTER — Encounter (HOSPITAL_BASED_OUTPATIENT_CLINIC_OR_DEPARTMENT_OTHER): Payer: Medicare Other | Admitting: Oncology

## 2010-10-27 ENCOUNTER — Other Ambulatory Visit: Payer: Self-pay | Admitting: Oncology

## 2010-10-27 DIAGNOSIS — R0989 Other specified symptoms and signs involving the circulatory and respiratory systems: Secondary | ICD-10-CM

## 2010-10-27 DIAGNOSIS — C8590 Non-Hodgkin lymphoma, unspecified, unspecified site: Secondary | ICD-10-CM

## 2010-10-27 DIAGNOSIS — Z87898 Personal history of other specified conditions: Secondary | ICD-10-CM

## 2010-10-27 DIAGNOSIS — D45 Polycythemia vera: Secondary | ICD-10-CM

## 2011-02-12 DIAGNOSIS — M7710 Lateral epicondylitis, unspecified elbow: Secondary | ICD-10-CM | POA: Diagnosis not present

## 2011-02-16 ENCOUNTER — Telehealth: Payer: Self-pay | Admitting: Oncology

## 2011-02-16 DIAGNOSIS — M25529 Pain in unspecified elbow: Secondary | ICD-10-CM | POA: Diagnosis not present

## 2011-02-16 DIAGNOSIS — M7710 Lateral epicondylitis, unspecified elbow: Secondary | ICD-10-CM | POA: Diagnosis not present

## 2011-02-16 NOTE — Telephone Encounter (Signed)
Talked to pt, gave him appt date, lab and CT on 04/20/11 and Md visit on 4/12, NPO 4 hrs prior to CT

## 2011-02-26 ENCOUNTER — Telehealth: Payer: Self-pay | Admitting: *Deleted

## 2011-02-26 NOTE — Telephone Encounter (Signed)
Notified pt that Dr. Cyndie Chime suggested that we wait & see if this goes away in the next 2 wks & if not to let us know & we will get him in.  Pt. Satisfied with this.

## 2011-02-26 NOTE — Telephone Encounter (Signed)
Received vm call from wife stating that they have found a knot on the side of his neck & wondered if he needed to come in.  He has a scan scheduled in April. Returned call @ 11:30am & wife reports that knot is on left side of neck,@ dime size, movable & no pain.  He had some nausea @ 2 days ago, sore throat @ 1wks ago that went away next day, & had sweating & feeling hot @ 1 mo ago.  She reports no fever.  Will discuss with Dr. Cyndie Chime.

## 2011-03-11 ENCOUNTER — Telehealth: Payer: Self-pay | Admitting: *Deleted

## 2011-03-11 NOTE — Telephone Encounter (Signed)
Pt's wife called  & left vm stating that pt still has a knot on the left side of his neck but pt doesn't think it has changed much & she just wanted to check & see if he needed to be seen. Returned call to Alice & she states pt has had a sore throat off & on.  He gargles with salt water & it seems to go away.  She reports he ran fever 3 nights ago @ 100 degrees & had a sore stomach with nausea but no vomiting for a couple of days this week & diarrhea x 2 yest & stomach still sore today.  Will report to Dr.Granfortuna.

## 2011-03-14 ENCOUNTER — Encounter: Payer: Self-pay | Admitting: Oncology

## 2011-03-14 ENCOUNTER — Other Ambulatory Visit: Payer: Self-pay | Admitting: Oncology

## 2011-03-14 DIAGNOSIS — C8590 Non-Hodgkin lymphoma, unspecified, unspecified site: Secondary | ICD-10-CM

## 2011-03-14 HISTORY — DX: Non-Hodgkin lymphoma, unspecified, unspecified site: C85.90

## 2011-03-15 ENCOUNTER — Other Ambulatory Visit: Payer: Self-pay

## 2011-03-16 ENCOUNTER — Other Ambulatory Visit: Payer: Self-pay | Admitting: Nurse Practitioner

## 2011-03-16 ENCOUNTER — Telehealth: Payer: Self-pay | Admitting: Oncology

## 2011-03-16 ENCOUNTER — Other Ambulatory Visit: Payer: Self-pay | Admitting: Oncology

## 2011-03-16 ENCOUNTER — Other Ambulatory Visit: Payer: Medicare Other | Admitting: Lab

## 2011-03-16 ENCOUNTER — Ambulatory Visit (HOSPITAL_BASED_OUTPATIENT_CLINIC_OR_DEPARTMENT_OTHER): Payer: Medicare Other | Admitting: Nurse Practitioner

## 2011-03-16 VITALS — BP 121/80 | HR 65 | Temp 98.3°F | Ht 72.0 in | Wt 206.3 lb

## 2011-03-16 DIAGNOSIS — C8590 Non-Hodgkin lymphoma, unspecified, unspecified site: Secondary | ICD-10-CM

## 2011-03-16 DIAGNOSIS — C8589 Other specified types of non-Hodgkin lymphoma, extranodal and solid organ sites: Secondary | ICD-10-CM

## 2011-03-16 DIAGNOSIS — C7800 Secondary malignant neoplasm of unspecified lung: Secondary | ICD-10-CM | POA: Diagnosis not present

## 2011-03-16 DIAGNOSIS — J438 Other emphysema: Secondary | ICD-10-CM | POA: Diagnosis not present

## 2011-03-16 LAB — COMPREHENSIVE METABOLIC PANEL
Albumin: 3.9 g/dL (ref 3.5–5.2)
CO2: 29 mEq/L (ref 19–32)
Calcium: 9.5 mg/dL (ref 8.4–10.5)
Chloride: 104 mEq/L (ref 96–112)
Glucose, Bld: 63 mg/dL — ABNORMAL LOW (ref 70–99)
Potassium: 5 mEq/L (ref 3.5–5.3)
Sodium: 139 mEq/L (ref 135–145)
Total Bilirubin: 0.4 mg/dL (ref 0.3–1.2)
Total Protein: 6.7 g/dL (ref 6.0–8.3)

## 2011-03-16 LAB — CBC WITH DIFFERENTIAL/PLATELET
Basophils Absolute: 0 10*3/uL (ref 0.0–0.1)
EOS%: 1.8 % (ref 0.0–7.0)
HCT: 50.7 % — ABNORMAL HIGH (ref 38.4–49.9)
HGB: 17.2 g/dL — ABNORMAL HIGH (ref 13.0–17.1)
MCH: 30.1 pg (ref 27.2–33.4)
MONO#: 0.7 10*3/uL (ref 0.1–0.9)
NEUT#: 4.2 10*3/uL (ref 1.5–6.5)
NEUT%: 60.1 % (ref 39.0–75.0)
RDW: 12.6 % (ref 11.0–14.6)
WBC: 7 10*3/uL (ref 4.0–10.3)
lymph#: 1.9 10*3/uL (ref 0.9–3.3)

## 2011-03-16 LAB — LACTATE DEHYDROGENASE: LDH: 159 U/L (ref 94–250)

## 2011-03-16 NOTE — Telephone Encounter (Signed)
called pt and informed him that we cancelled his appt for 04/09 to keep 04/12

## 2011-03-16 NOTE — Telephone Encounter (Signed)
Ct neck added and appt moved up to 3/13  aom

## 2011-03-16 NOTE — Progress Notes (Signed)
OFFICE PROGRESS NOTE  Interval history:  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 He presented at that time with inguinal adenopathy. There was progressive enlargement of lymph nodes with repeat biopsy November 2005 showing conversion to a high-grade lymphoma. He was treated with CHOP/Rituxan chemotherapy with complete remission. He developed a pulmonary parenchymal lesion in November 2008. This was treated with surgical resection on 12/19/2006. The histology was low grade. He was treated with adjuvant Rituxan for 2 years between December 2008 and August 2010. Most recent restaging CT scans of the chest, abdomen and pelvis 10/20/2010 showed no evidence of recurrent lymphoma.  Approximately one month ago [name] noted a "painful knot" at the left neck. The knot is unchanged in size. He has had a sore throat intermittently over the past month. The sore throat improves with salt water rinses. He denies nasal congestion or sinus drainage. No sweats. He has had a single fever in the past month measured at "a little over 100". He has a good appetite. He denies weight loss. No shortness of breath or cough. No bowel or bladder problems.   Objective: Blood pressure 121/80, pulse 65, temperature 98.3 F (36.8 C), temperature source Oral, height 6' (1.829 m), weight 206 lb 4.8 oz (93.577 kg).  Oropharynx is without thrush or ulceration. Posterior pharynx is without erythema or exudate. Approximate 1-2 cm rounded submandibular node. No other palpable cervical, supraclavicular, axillary or inguinal lymph nodes. Lungs are clear. No wheezes or rales. Regular cardiac rhythm. Abdomen is soft and nontender. No organomegaly. Extremities are without edema. Calves are soft and nontender.  Lab Results: Lab Results  Component Value Date   WBC 7.0 03/16/2011   HGB 17.2* 03/16/2011   HCT 50.7* 03/16/2011   MCV 88.9 03/16/2011   PLT 216 03/16/2011    Chemistry:    Chemistry      Component Value Date/Time   NA 142 10/20/2010 1000   NA 139 10/22/2009 0852   K 4.2 10/20/2010 1000   K 4.3 10/22/2009 0852   CL 102 10/20/2010 1000   CL 104 10/22/2009 0852   CO2 26 10/20/2010 1000   CO2 30 10/22/2009 0852   BUN 11 10/20/2010 1000   BUN 18 10/22/2009 0852   CREATININE 1.0 10/20/2010 1000   CREATININE 1.12 10/22/2009 0852      Component Value Date/Time   CALCIUM 9.0 10/20/2010 1000   CALCIUM 9.1 10/22/2009 0852   ALKPHOS 57 10/20/2010 1000   ALKPHOS 45 10/22/2009 0852   AST 30 10/20/2010 1000   AST 24 10/22/2009 0852   ALT 24 10/22/2009 0852   BILITOT 0.80 10/20/2010 1000   BILITOT 1.1 10/22/2009 0852       Studies/Results: No results found.  Medications: I have reviewed the patient's current medications.  Assessment/Plan:  1. Initial low-grade B-cell non-Hodgkin's lymphoma presenting with inguinal adenopathy in June 2004.  Progressive enlargement of lymph nodes with repeat biopsy November 2005 showing conversion to a high grade lymphoma.  He was treated with CHOP/Rituxan chemotherapy with complete remission.  He developed a pulmonary parenchymal lesion in November 2008 and underwent surgical resection on 12/19/2006.  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 He completed Rituxan for 2 years between 12/2006 and 08/2008.  CT scans October 2012 showed no evidence of lymphoma.  2. Mildly enlarged left submandibular node. 3. Chronic polycythemia with normal erythropoietin level and low ESR. Nonsmoker. His brother was treated  for polycythemia and required phlebotomies. As the hemoglobin and hematocrit have been stable over time Dr. Cyndie Chime has not recommended phlebotomy.  Disposition-[name] has a mildly enlarged left submandibular lymph node. We will add a neck CT to upcoming CT scans. He will keep his scheduled followup appointment with Dr. Cyndie Chime on 04/23/2011.  Plan reviewed with Dr.  Cyndie Chime.  Lonna Cobb ANP/GNP-BC

## 2011-03-19 DIAGNOSIS — E782 Mixed hyperlipidemia: Secondary | ICD-10-CM | POA: Diagnosis not present

## 2011-03-19 DIAGNOSIS — I499 Cardiac arrhythmia, unspecified: Secondary | ICD-10-CM | POA: Diagnosis not present

## 2011-03-24 ENCOUNTER — Other Ambulatory Visit (HOSPITAL_COMMUNITY): Payer: Medicare Other

## 2011-03-24 ENCOUNTER — Ambulatory Visit (HOSPITAL_COMMUNITY)
Admission: RE | Admit: 2011-03-24 | Discharge: 2011-03-24 | Disposition: A | Discharge status: Home / Self Care | Payer: Medicare Other | Source: Ambulatory Visit | Attending: Oncology | Admitting: Oncology

## 2011-03-24 DIAGNOSIS — Z85118 Personal history of other malignant neoplasm of bronchus and lung: Secondary | ICD-10-CM | POA: Diagnosis not present

## 2011-03-24 DIAGNOSIS — K7689 Other specified diseases of liver: Secondary | ICD-10-CM | POA: Diagnosis not present

## 2011-03-24 DIAGNOSIS — C8589 Other specified types of non-Hodgkin lymphoma, extranodal and solid organ sites: Secondary | ICD-10-CM | POA: Diagnosis not present

## 2011-03-24 DIAGNOSIS — C7800 Secondary malignant neoplasm of unspecified lung: Secondary | ICD-10-CM | POA: Diagnosis not present

## 2011-03-24 DIAGNOSIS — K449 Diaphragmatic hernia without obstruction or gangrene: Secondary | ICD-10-CM | POA: Diagnosis not present

## 2011-03-24 DIAGNOSIS — R599 Enlarged lymph nodes, unspecified: Secondary | ICD-10-CM | POA: Insufficient documentation

## 2011-03-24 DIAGNOSIS — I700 Atherosclerosis of aorta: Secondary | ICD-10-CM | POA: Diagnosis not present

## 2011-03-24 DIAGNOSIS — Z9079 Acquired absence of other genital organ(s): Secondary | ICD-10-CM | POA: Insufficient documentation

## 2011-03-24 DIAGNOSIS — Z87898 Personal history of other specified conditions: Secondary | ICD-10-CM | POA: Diagnosis not present

## 2011-03-24 DIAGNOSIS — M47812 Spondylosis without myelopathy or radiculopathy, cervical region: Secondary | ICD-10-CM | POA: Insufficient documentation

## 2011-03-24 DIAGNOSIS — J438 Other emphysema: Secondary | ICD-10-CM | POA: Diagnosis not present

## 2011-03-24 DIAGNOSIS — K5730 Diverticulosis of large intestine without perforation or abscess without bleeding: Secondary | ICD-10-CM | POA: Insufficient documentation

## 2011-03-24 DIAGNOSIS — Q619 Cystic kidney disease, unspecified: Secondary | ICD-10-CM | POA: Diagnosis not present

## 2011-03-24 DIAGNOSIS — N400 Enlarged prostate without lower urinary tract symptoms: Secondary | ICD-10-CM | POA: Insufficient documentation

## 2011-03-24 DIAGNOSIS — C8590 Non-Hodgkin lymphoma, unspecified, unspecified site: Secondary | ICD-10-CM

## 2011-03-24 DIAGNOSIS — Z902 Acquired absence of lung [part of]: Secondary | ICD-10-CM | POA: Diagnosis not present

## 2011-03-24 IMAGING — CT CT NECK W/ CM
3 of 9 series · 10 of 33 positions shown, 11 images · IV contrast ([ID] OMNI 300)
Comparison: Whole body PET scan [DATE].

CLINICAL DATA: Enlarged left submandibular node.  History of
lymphoma.

CT NECK WITH CONTRAST
TECHNIQUE: Multidetector CT imaging of the neck was performed with
intravenous contrast.
Contrast: 125mL OMNIPAQUE IOHEXOL 300 MG/ML IJ SOLN

[Series 2: cap st · axial · 0.78mm/px · z∈[-687,-347]mm · 3 of 138 slices shown, 4 images]
[im 35/138  soft-tissue]
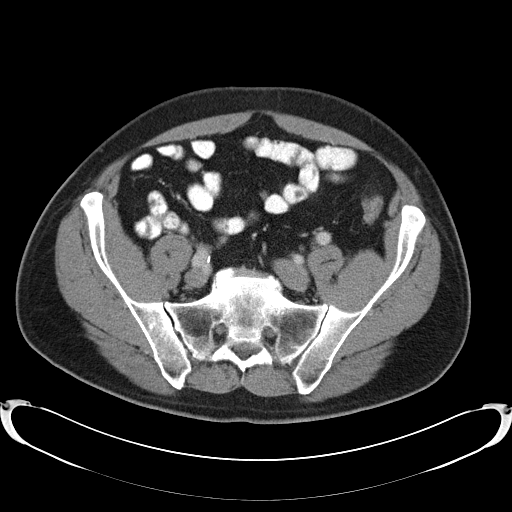
[im 35/138  bone]
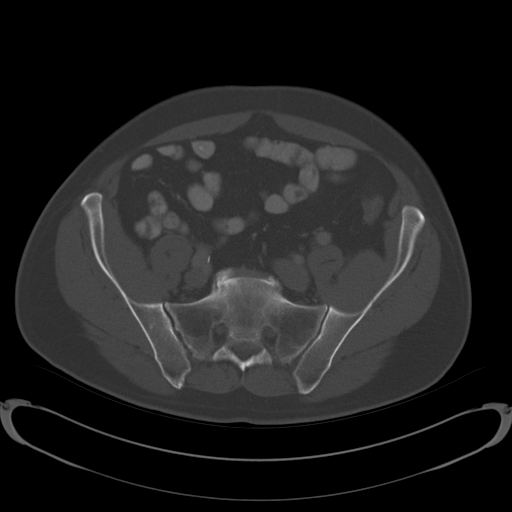
[im 69/138  bone]
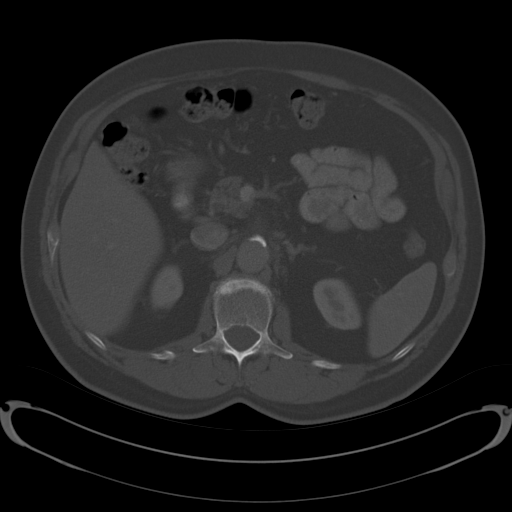
[im 103/138  bone]
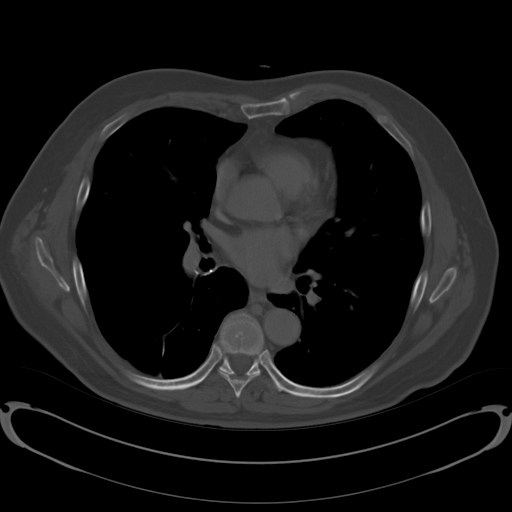

[Series 602: <mpr thick range> · coronal · 1.35mm/px · 2 of 105 slices shown]
[im 35/105  bone]
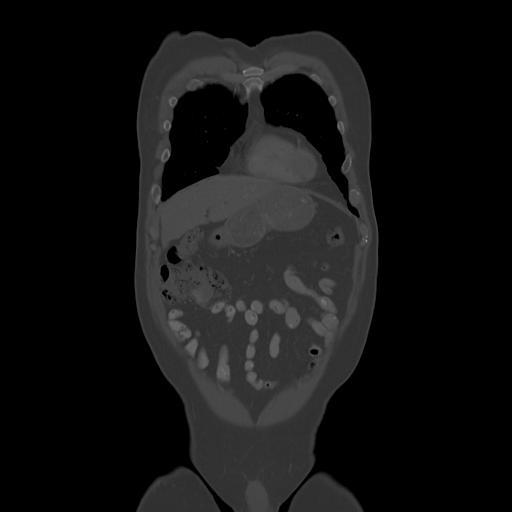
[im 70/105  bone]
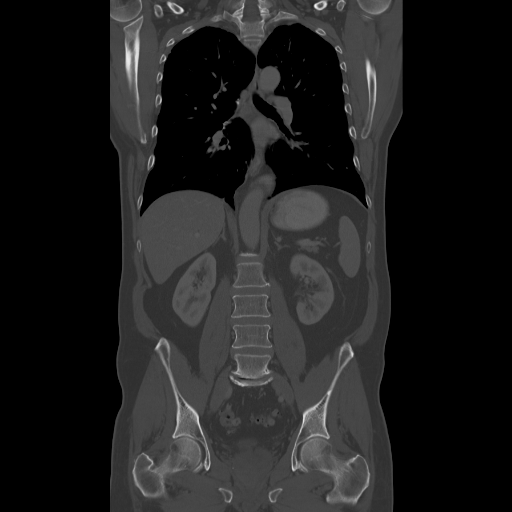

[Series 603: <mpr thick range(1)> · sagittal · 1.35mm/px · 5 of 125 slices shown]
[im 32/125  bone]
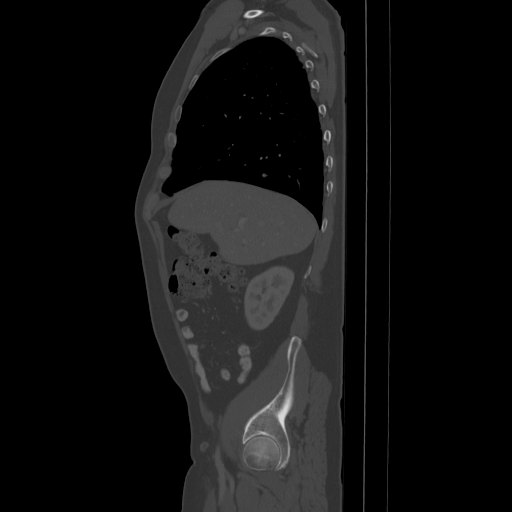
[im 47/125  bone]
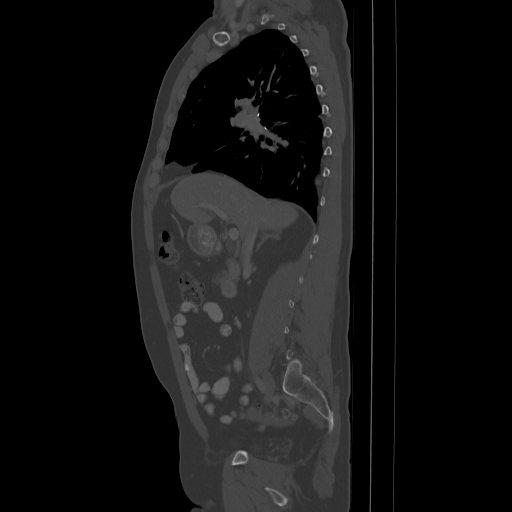
[im 63/125  bone]
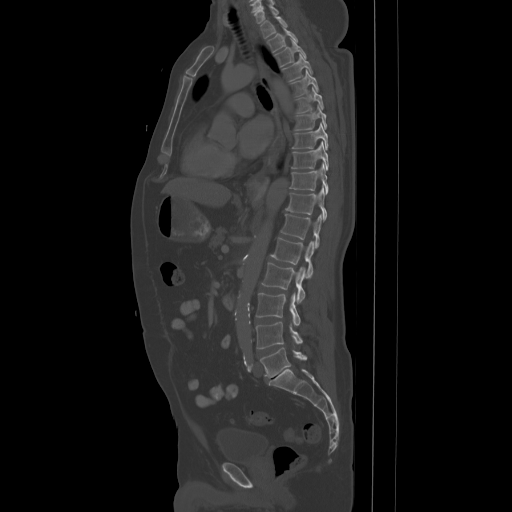
[im 78/125  bone]
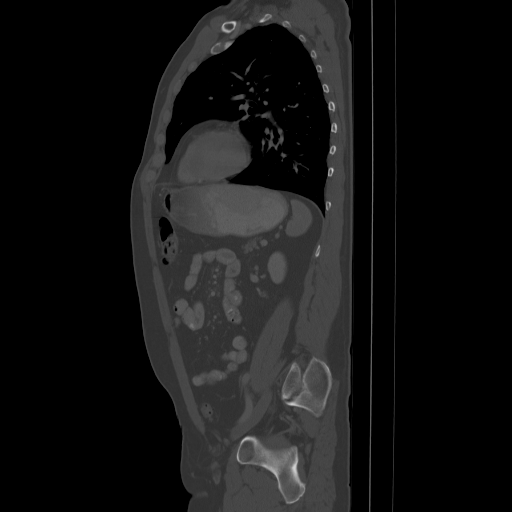
[im 94/125  bone]
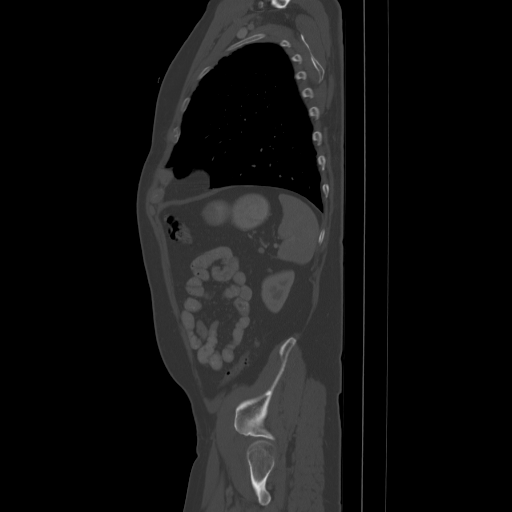

[10 of 33 positions shown; findings below may reference images not displayed]

FINDINGS: A 12 x 8 mm hyperdense mass lesion appears to be within
the left submandibular gland.  This is subjacent to the area
marked.  The submandibular gland is otherwise mildly heterogeneous,
similar to the right.  The parotid glands are within normal limits
bilaterally. There may have been a tiny hyperdensity within the
left submandibular gland on the prior PET scan.  Slight asymmetric
FDG uptake could have otherwise been within normal limits.

Small rounded submental lymph nodes measures 7 and 6 mm
respectively.  No significant adenopathy is present in the neck.

No focal mucosal or submucosal lesions are present.  The vocal
cords are midline and symmetric.  Limited imaging of the brain is
unremarkable.

The bone windows demonstrate moderate spondylosis in the lower
cervical spine, particularly at C5-6 and C6-7.
IMPRESSION: 1.  12 mm hyperdense lesion appears to be within the left
submandibular gland.  This could be an intraparotid lymph node in
the associated with the patient's history of lymphoma.  A primary
salivary gland tumor is also considered.  There appears to be
relatively slow growth, suggesting a benign lesion.  Imaging is
nonspecific for evaluation of malignancy and salivary gland tumor.
ENT consultation may be helpful.
2.  No other significant pathologic lymph nodes in the neck.  Small
rounded submental lymph nodes appear stable.
3.  Moderate spondylosis of the lower cervical spine.

## 2011-03-24 MED ORDER — IOHEXOL 300 MG/ML  SOLN
125.0000 mL | Freq: Once | INTRAMUSCULAR | Status: AC | PRN
  Administered 2011-03-24: 125 mL via INTRAVENOUS

## 2011-03-26 ENCOUNTER — Telehealth: Payer: Self-pay | Admitting: *Deleted

## 2011-03-26 NOTE — Telephone Encounter (Signed)
Pt. notified of CT neck, CAP & reported no active lymphoma per Dr. Cyndie Chime.  They had questions & were concerned what the area in his neck is & was told to watch & if he sees significant change in growth or other symptoms to call us.

## 2011-03-26 NOTE — Telephone Encounter (Signed)
Message copied by Sabino Snipes on Fri Mar 26, 2011  5:11 PM ------      Message from: Levert Feinstein      Created: Wed Mar 24, 2011  7:28 PM       Call pt CT chest/abdomen  No active lymphoma      Misty Stanley - I don't see result for CT neck  JG 03/24/11

## 2011-04-03 ENCOUNTER — Telehealth: Payer: Self-pay | Admitting: Oncology

## 2011-04-03 ENCOUNTER — Other Ambulatory Visit: Payer: Self-pay | Admitting: Oncology

## 2011-04-03 DIAGNOSIS — K118 Other diseases of salivary glands: Secondary | ICD-10-CM

## 2011-04-03 DIAGNOSIS — C8590 Non-Hodgkin lymphoma, unspecified, unspecified site: Secondary | ICD-10-CM

## 2011-04-03 NOTE — Telephone Encounter (Signed)
See. encounter node already dictated in the chart

## 2011-04-05 ENCOUNTER — Telehealth: Payer: Self-pay | Admitting: Oncology

## 2011-04-05 NOTE — Telephone Encounter (Signed)
S/w the pt's wife regarding the appt with dr shoemaker at the [facility] ear nose[facility]thro[facility] for 04/19/2011@3 :30pm

## 2011-04-12 HISTORY — PX: SUBMANDIBULAR GLAND EXCISION: SHX2456

## 2011-04-19 DIAGNOSIS — C8589 Other specified types of non-Hodgkin lymphoma, extranodal and solid organ sites: Secondary | ICD-10-CM | POA: Diagnosis not present

## 2011-04-19 DIAGNOSIS — D37039 Neoplasm of uncertain behavior of the major salivary glands, unspecified: Secondary | ICD-10-CM | POA: Diagnosis not present

## 2011-04-19 DIAGNOSIS — R220 Localized swelling, mass and lump, head: Secondary | ICD-10-CM | POA: Diagnosis not present

## 2011-04-19 DIAGNOSIS — R221 Localized swelling, mass and lump, neck: Secondary | ICD-10-CM | POA: Diagnosis not present

## 2011-04-20 ENCOUNTER — Other Ambulatory Visit: Payer: Self-pay | Admitting: *Deleted

## 2011-04-20 ENCOUNTER — Other Ambulatory Visit: Payer: Medicare Other | Admitting: Lab

## 2011-04-20 ENCOUNTER — Telehealth: Payer: Self-pay | Admitting: *Deleted

## 2011-04-20 ENCOUNTER — Other Ambulatory Visit (HOSPITAL_COMMUNITY): Payer: Medicare Other

## 2011-04-20 NOTE — Telephone Encounter (Signed)
Received call from pt's wife, Kendal Hymen stating that Anthony Kelly went to the ENT yest & he wants to set him up for surg to remove salivary gland.  She reports that her husband has an appt this fri @ 11:30am & wants to know if he needs to keep this.  She reports that there is nothing new going on with him.   Note to Dr. Cyndie Chime.

## 2011-04-20 NOTE — Telephone Encounter (Signed)
Notified pt's wife that we will cancel 04/23/11 appt & r/s 1week after surgery when we have results.  She reports that surgery is not scheduled yet but will call back when it is.

## 2011-04-21 ENCOUNTER — Telehealth: Payer: Self-pay | Admitting: Oncology

## 2011-04-21 NOTE — Telephone Encounter (Signed)
CANCELLED PER  appt on 04/12 04/09 ORDER. PT WILL RTN CALL TO SCHED

## 2011-04-22 ENCOUNTER — Encounter (HOSPITAL_COMMUNITY): Payer: Self-pay | Admitting: Pharmacy Technician

## 2011-04-22 ENCOUNTER — Telehealth: Payer: Self-pay | Admitting: *Deleted

## 2011-04-22 NOTE — Telephone Encounter (Signed)
Received vm call from wife, Kendal Hymen stating that post op appt with Dr Annalee Genta is 05/10/11 @ 1:10 pm & surgery is 04/30/11.  She would like to r/s appt with Dr. Cyndie Chime for the wk of 4/29 or the following week & would like to be called if we get the result.  Note to Dr Cyndie Chime.

## 2011-04-23 ENCOUNTER — Ambulatory Visit: Payer: Medicare Other | Admitting: Oncology

## 2011-04-23 NOTE — Pre-Procedure Instructions (Signed)
20 Anthony Kelly  04/23/2011   Your procedure is scheduled on:    Friday 04/30/11  Report to [facility] at 850 AM.  Call this number if you have problems the morning of surgery: [phone]   Remember:   Do not eat food:After Midnight.  May have clear liquids: up to 4 Hours before arrival.  Clear liquids include soda, tea, black coffee, apple or grape juice, broth.  Take these medicines the morning of surgery with A SIP OF WATER: Metoprolol   STOP Aspirin, Fish oil and relafen today  Do not wear jewelry, make-up or nail polish.  Do not wear lotions, powders, or perfumes. You may wear deodorant.  Do not shave 48 hours prior to surgery.  Do not bring valuables to the hospital.  Contacts, dentures or bridgework may not be worn into surgery.  Leave suitcase in the car. After surgery it may be brought to your room.  For patients admitted to the hospital, checkout time is 11:00 AM the day of discharge.   Patients discharged the day of surgery will not be allowed to drive home.  Name and phone number of your driver: Fleming Prill  Special Instructions: CHG Shower Use Special Wash: 1/2 bottle night before surgery and 1/2 bottle morning of surgery.   Please read over the following fact sheets that you were given: Pain Booklet, MRSA Information and Surgical Site Infection Prevention

## 2011-04-26 ENCOUNTER — Encounter (HOSPITAL_COMMUNITY): Payer: Self-pay

## 2011-04-26 ENCOUNTER — Encounter (HOSPITAL_COMMUNITY)
Admission: RE | Admit: 2011-04-26 | Discharge: 2011-04-26 | Disposition: A | Discharge status: Home / Self Care | Payer: Medicare Other | Source: Ambulatory Visit | Attending: Otolaryngology | Admitting: Otolaryngology

## 2011-04-26 DIAGNOSIS — I498 Other specified cardiac arrhythmias: Secondary | ICD-10-CM | POA: Diagnosis not present

## 2011-04-26 DIAGNOSIS — Z01812 Encounter for preprocedural laboratory examination: Secondary | ICD-10-CM | POA: Diagnosis not present

## 2011-04-26 DIAGNOSIS — Z01818 Encounter for other preprocedural examination: Secondary | ICD-10-CM | POA: Diagnosis not present

## 2011-04-26 DIAGNOSIS — C8581 Other specified types of non-Hodgkin lymphoma, lymph nodes of head, face, and neck: Secondary | ICD-10-CM | POA: Diagnosis not present

## 2011-04-26 DIAGNOSIS — K449 Diaphragmatic hernia without obstruction or gangrene: Secondary | ICD-10-CM | POA: Diagnosis not present

## 2011-04-26 DIAGNOSIS — Z01811 Encounter for preprocedural respiratory examination: Secondary | ICD-10-CM | POA: Diagnosis not present

## 2011-04-26 HISTORY — DX: Cardiac arrhythmia, unspecified: I49.9

## 2011-04-26 LAB — BASIC METABOLIC PANEL
BUN: 15 mg/dL (ref 6–23)
CO2: 26 mEq/L (ref 19–32)
Calcium: 9.3 mg/dL (ref 8.4–10.5)
Creatinine, Ser: 1 mg/dL (ref 0.50–1.35)
GFR calc Af Amer: 89 mL/min — ABNORMAL LOW (ref >90)

## 2011-04-26 LAB — CBC
MCHC: 34.9 g/dL (ref 30.0–36.0)
MCV: 87.3 fL (ref 78.0–100.0)
Platelets: 170 10*3/uL (ref 150–400)
RDW: 13.2 % (ref 11.5–15.5)
WBC: 7.1 10*3/uL (ref 4.0–10.5)

## 2011-04-26 LAB — SURGICAL PCR SCREEN
MRSA, PCR: NEGATIVE
Staphylococcus aureus: NEGATIVE

## 2011-04-26 IMAGING — CR DG CHEST 2V
2 series · 2 of 2 positions shown · non-contrast
Comparison: CT of the chest [DATE]

CLINICAL DATA: Preop

CHEST - 2 VIEW

[view not recorded (1 of 2)]
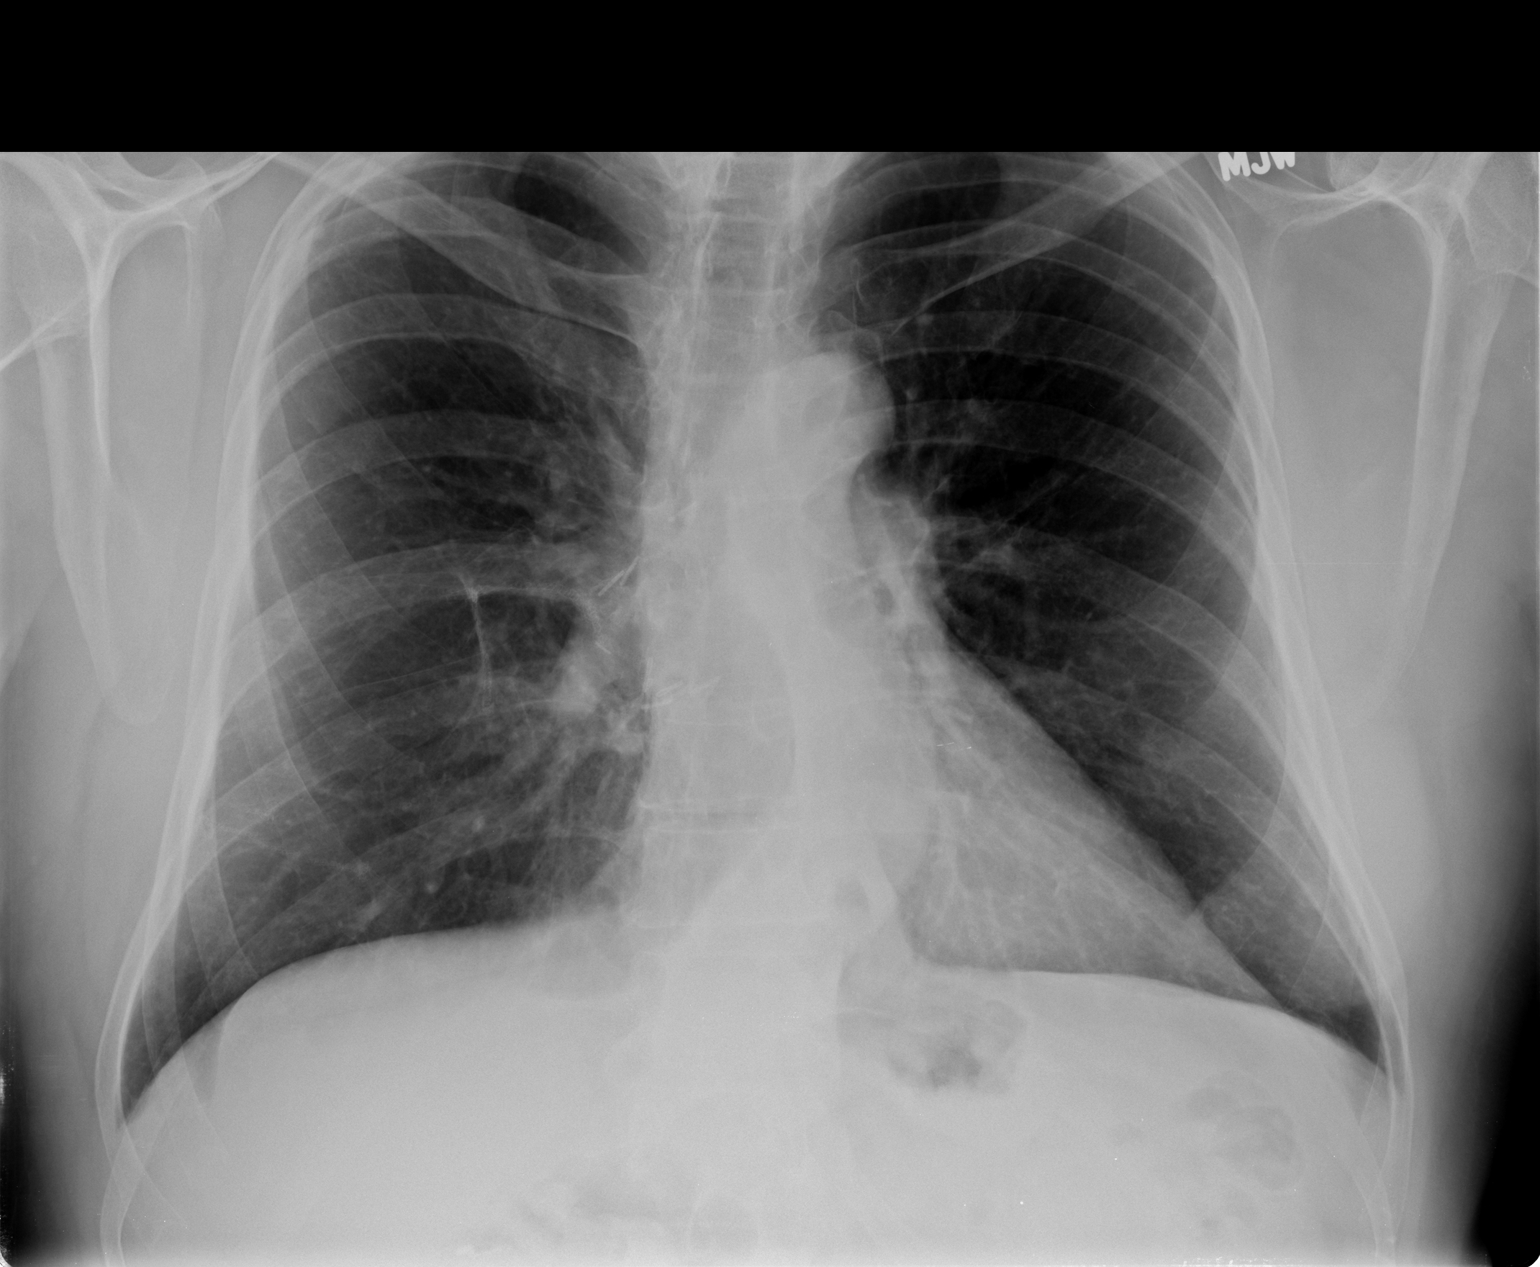

[view not recorded (2 of 2)]
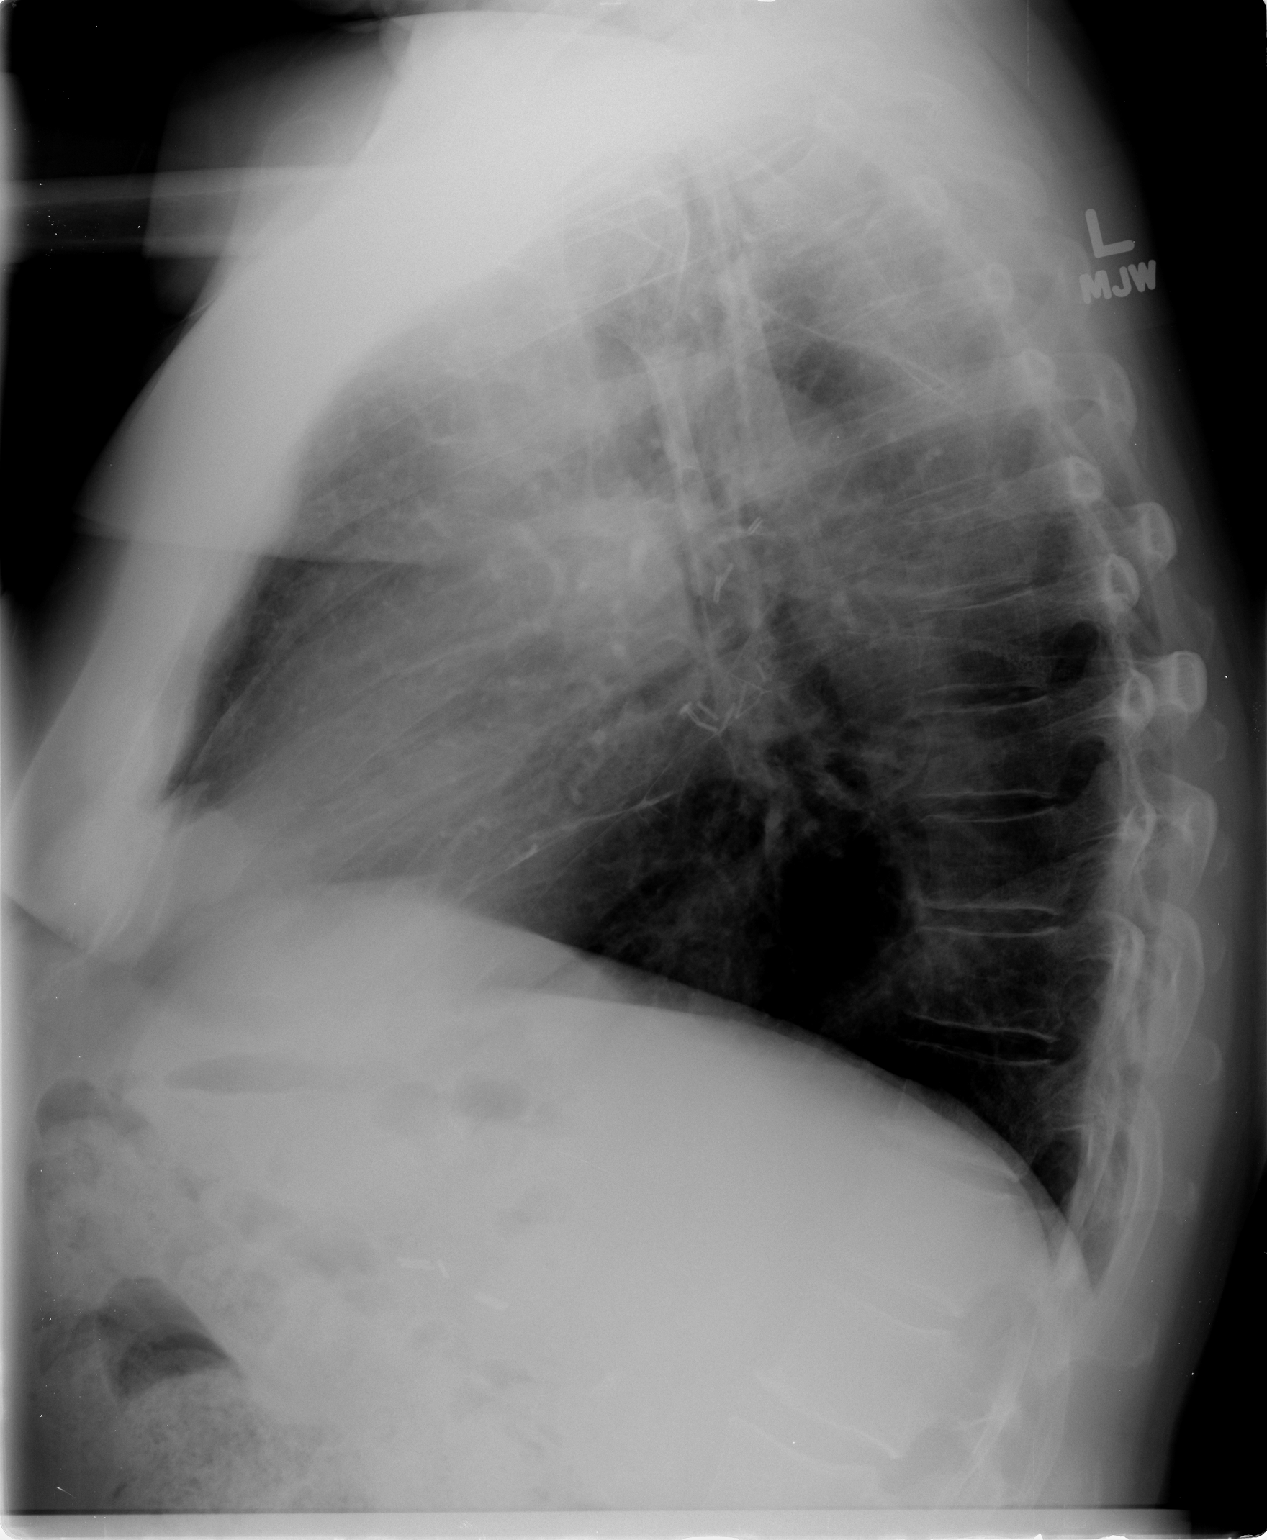

[2 of 2 positions shown; findings below may reference images not displayed]

FINDINGS: Cardiomediastinal silhouette is stable.  Postsurgical
changes are noted in the right lower lobe medially.  Tortuous
thoracic aorta.  Small hiatal hernia.  Mild degenerative changes
thoracic spine.  No acute infiltrate or pulmonary edema.
IMPRESSION: No active disease.  Small hiatal hernia.  Postsurgical changes
right lower lobe.

## 2011-04-27 ENCOUNTER — Telehealth: Payer: Self-pay | Admitting: Oncology

## 2011-04-27 NOTE — Telephone Encounter (Signed)
Pt's wife called left message. I called pt's wife and she informed me that pt will see Dr. Annalee Genta on 4/29 and pt needs appt with Dr. Reece Agar after that, emailed Dr. Reece Agar and told pt that once I hear from MD  I will call back.

## 2011-04-28 ENCOUNTER — Other Ambulatory Visit: Payer: Self-pay | Admitting: Oncology

## 2011-04-28 ENCOUNTER — Other Ambulatory Visit: Payer: Self-pay | Admitting: Otolaryngology

## 2011-04-28 ENCOUNTER — Telehealth: Payer: Self-pay | Admitting: *Deleted

## 2011-04-28 NOTE — Progress Notes (Signed)
Called Dr Clovis Pu office for orders.  Spoke with Boley who stated she will enter the orders.

## 2011-04-28 NOTE — Telephone Encounter (Signed)
Pt's wife notified that f/u visit made with Dr Cyndie Chime 05/11/11 @ 1030 per Dr. Cyndie Chime & results will be discussed at that time.

## 2011-04-29 MED ORDER — DEXAMETHASONE SODIUM PHOSPHATE 10 MG/ML IJ SOLN
10.0000 mg | Freq: Once | INTRAMUSCULAR | Status: AC
  Administered 2011-04-30: 10 mg via INTRAVENOUS
  Filled 2011-04-29: qty 1

## 2011-04-29 MED ORDER — CEFAZOLIN SODIUM-DEXTROSE 2-3 GM-% IV SOLR
2.0000 g | INTRAVENOUS | Status: AC
  Administered 2011-04-30: 2 g via INTRAVENOUS
  Filled 2011-04-29: qty 50

## 2011-04-29 MED ORDER — DEXAMETHASONE SODIUM PHOSPHATE 10 MG/ML IJ SOLN: 10.0000 mg | Freq: Once | INTRAMUSCULAR | Status: DC

## 2011-04-30 ENCOUNTER — Encounter (HOSPITAL_COMMUNITY): Payer: Self-pay | Admitting: Anesthesiology

## 2011-04-30 ENCOUNTER — Ambulatory Visit (HOSPITAL_COMMUNITY)
Admission: RE | Admit: 2011-04-30 | Discharge: 2011-05-01 | Disposition: A | Discharge status: Home / Self Care | Payer: Medicare Other | Source: Ambulatory Visit | Attending: Otolaryngology | Admitting: Otolaryngology

## 2011-04-30 ENCOUNTER — Encounter (HOSPITAL_COMMUNITY)
Admission: RE | Disposition: A | Discharge status: Home / Self Care | Payer: Self-pay | Source: Ambulatory Visit | Attending: Otolaryngology

## 2011-04-30 ENCOUNTER — Encounter (HOSPITAL_COMMUNITY): Payer: Self-pay | Admitting: Otolaryngology

## 2011-04-30 ENCOUNTER — Ambulatory Visit (HOSPITAL_COMMUNITY): Payer: Medicare Other | Admitting: Anesthesiology

## 2011-04-30 DIAGNOSIS — Z01818 Encounter for other preprocedural examination: Secondary | ICD-10-CM | POA: Insufficient documentation

## 2011-04-30 DIAGNOSIS — C8581 Other specified types of non-Hodgkin lymphoma, lymph nodes of head, face, and neck: Secondary | ICD-10-CM | POA: Insufficient documentation

## 2011-04-30 DIAGNOSIS — Z01812 Encounter for preprocedural laboratory examination: Secondary | ICD-10-CM | POA: Diagnosis not present

## 2011-04-30 DIAGNOSIS — I498 Other specified cardiac arrhythmias: Secondary | ICD-10-CM | POA: Diagnosis not present

## 2011-04-30 DIAGNOSIS — C8589 Other specified types of non-Hodgkin lymphoma, extranodal and solid organ sites: Secondary | ICD-10-CM | POA: Diagnosis not present

## 2011-04-30 DIAGNOSIS — K118 Other diseases of salivary glands: Secondary | ICD-10-CM

## 2011-04-30 DIAGNOSIS — D37039 Neoplasm of uncertain behavior of the major salivary glands, unspecified: Secondary | ICD-10-CM | POA: Diagnosis not present

## 2011-04-30 HISTORY — DX: Unspecified osteoarthritis, unspecified site: M19.90

## 2011-04-30 HISTORY — PX: SUBMANDIBULAR GLAND EXCISION: SHX2456

## 2011-04-30 SURGERY — EXCISION, SUBMANDIBULAR GLAND
Anesthesia: General | Site: Neck | Laterality: Left | Wound class: Clean

## 2011-04-30 MED ORDER — LORAZEPAM 2 MG/ML IJ SOLN: 1.0000 mg | Freq: Once | INTRAMUSCULAR | Status: DC | PRN

## 2011-04-30 MED ORDER — ARTIFICIAL TEARS OP OINT
TOPICAL_OINTMENT | OPHTHALMIC | Status: DC | PRN
  Administered 2011-04-30: 1 via OPHTHALMIC

## 2011-04-30 MED ORDER — SODIUM CHLORIDE 0.9 % IR SOLN
Status: DC | PRN
  Administered 2011-04-30: 1000 mL

## 2011-04-30 MED ORDER — ONDANSETRON HCL 4 MG PO TABS: 4.0000 mg | ORAL_TABLET | ORAL | Status: DC | PRN

## 2011-04-30 MED ORDER — ONDANSETRON HCL 4 MG/2ML IJ SOLN
INTRAMUSCULAR | Status: DC | PRN
  Administered 2011-04-30: 4 mg via INTRAVENOUS

## 2011-04-30 MED ORDER — METOPROLOL SUCCINATE ER 25 MG PO TB24
25.0000 mg | ORAL_TABLET | Freq: Every day | ORAL | Status: DC
  Filled 2011-04-30: qty 1

## 2011-04-30 MED ORDER — FENTANYL CITRATE 0.05 MG/ML IJ SOLN
INTRAMUSCULAR | Status: DC | PRN
  Administered 2011-04-30: 25 ug via INTRAVENOUS
  Administered 2011-04-30 (×3): 50 ug via INTRAVENOUS

## 2011-04-30 MED ORDER — ONDANSETRON HCL 4 MG/2ML IJ SOLN: 4.0000 mg | INTRAMUSCULAR | Status: DC | PRN

## 2011-04-30 MED ORDER — ZOLPIDEM TARTRATE 5 MG PO TABS: 5.0000 mg | ORAL_TABLET | Freq: Every evening | ORAL | Status: DC | PRN

## 2011-04-30 MED ORDER — LACTATED RINGERS IV SOLN
INTRAVENOUS | Status: DC | PRN
  Administered 2011-04-30 (×2): via INTRAVENOUS

## 2011-04-30 MED ORDER — MORPHINE SULFATE 4 MG/ML IJ SOLN: 2.0000 mg | INTRAMUSCULAR | Status: DC | PRN

## 2011-04-30 MED ORDER — FENTANYL CITRATE 0.05 MG/ML IJ SOLN: 50.0000 ug | INTRAMUSCULAR | Status: DC | PRN

## 2011-04-30 MED ORDER — HYDROCODONE-ACETAMINOPHEN 5-325 MG PO TABS: 1.0000 | ORAL_TABLET | ORAL | Status: DC | PRN

## 2011-04-30 MED ORDER — EPHEDRINE SULFATE 50 MG/ML IJ SOLN
INTRAMUSCULAR | Status: DC | PRN
  Administered 2011-04-30 (×2): 10 mg via INTRAVENOUS

## 2011-04-30 MED ORDER — LIDOCAINE HCL 1 % IJ SOLN
INTRAMUSCULAR | Status: DC | PRN
  Administered 2011-04-30: 100 mg via INTRADERMAL

## 2011-04-30 MED ORDER — MIDAZOLAM HCL 2 MG/2ML IJ SOLN: 1.0000 mg | INTRAMUSCULAR | Status: DC | PRN

## 2011-04-30 MED ORDER — PROPOFOL 10 MG/ML IV EMUL
INTRAVENOUS | Status: DC | PRN
  Administered 2011-04-30: 160 mg via INTRAVENOUS
  Administered 2011-04-30: 40 mg via INTRAVENOUS

## 2011-04-30 MED ORDER — CEFAZOLIN SODIUM 1-5 GM-% IV SOLN
1.0000 g | Freq: Three times a day (TID) | INTRAVENOUS | Status: DC
  Administered 2011-04-30 – 2011-05-01 (×3): 1 g via INTRAVENOUS
  Filled 2011-04-30 (×6): qty 50

## 2011-04-30 MED ORDER — MIDAZOLAM HCL 5 MG/5ML IJ SOLN
INTRAMUSCULAR | Status: DC | PRN
  Administered 2011-04-30: 2 mg via INTRAVENOUS

## 2011-04-30 MED ORDER — LACTATED RINGERS IV SOLN
INTRAVENOUS | Status: DC
  Administered 2011-04-30: 09:00:00 via INTRAVENOUS

## 2011-04-30 MED ORDER — LIDOCAINE-EPINEPHRINE 1 %-1:100000 IJ SOLN
INTRAMUSCULAR | Status: DC | PRN
  Administered 2011-04-30: 2 mL

## 2011-04-30 MED ORDER — ACETAMINOPHEN 160 MG/5ML PO SOLN: 650.0000 mg | ORAL | Status: DC | PRN

## 2011-04-30 MED ORDER — ACETAMINOPHEN 650 MG RE SUPP: 650.0000 mg | RECTAL | Status: DC | PRN

## 2011-04-30 MED ORDER — LIDOCAINE HCL 4 % MT SOLN
OROMUCOSAL | Status: DC | PRN
  Administered 2011-04-30: 4 mL via TOPICAL

## 2011-04-30 MED ORDER — HYDROMORPHONE HCL PF 1 MG/ML IJ SOLN
0.2500 mg | INTRAMUSCULAR | Status: DC | PRN
  Administered 2011-04-30: 0.5 mg via INTRAVENOUS
  Administered 2011-04-30: 0.25 mg via INTRAVENOUS

## 2011-04-30 MED ORDER — DEXTROSE IN LACTATED RINGERS 5 % IV SOLN
INTRAVENOUS | Status: DC
  Administered 2011-04-30 – 2011-05-01 (×2): via INTRAVENOUS

## 2011-04-30 MED ORDER — SUCCINYLCHOLINE CHLORIDE 20 MG/ML IJ SOLN
INTRAMUSCULAR | Status: DC | PRN
  Administered 2011-04-30: 100 mg via INTRAVENOUS

## 2011-04-30 SURGICAL SUPPLY — 52 items
ADH SKN CLS APL DERMABOND .7 (GAUZE/BANDAGES/DRESSINGS) ×1
ATTRACTOMAT 16X20 MAGNETIC DRP (DRAPES) IMPLANT
BLADE SURG 10 STRL SS (BLADE) ×2 IMPLANT
CANISTER SUCTION 2500CC (MISCELLANEOUS) ×2 IMPLANT
CHERRY SPONGEY 1/2 (GAUZE/BANDAGES/DRESSINGS) ×1 IMPLANT
CLEANER TIP ELECTROSURG 2X2 (MISCELLANEOUS) ×2 IMPLANT
CLOTH BEACON ORANGE TIMEOUT ST (SAFETY) ×2 IMPLANT
CONT SPEC 4OZ CLIKSEAL STRL BL (MISCELLANEOUS) ×2 IMPLANT
CORDS BIPOLAR (ELECTRODE) ×2 IMPLANT
CRADLE DONUT ADULT HEAD (MISCELLANEOUS) ×2 IMPLANT
DECANTER SPIKE VIAL GLASS SM (MISCELLANEOUS) ×2 IMPLANT
DERMABOND ADVANCED (GAUZE/BANDAGES/DRESSINGS) ×1
DERMABOND ADVANCED .7 DNX12 (GAUZE/BANDAGES/DRESSINGS) IMPLANT
DRAIN CHANNEL 7F FF FLAT (WOUND CARE) IMPLANT
DRAIN JACKSON RD 7FR 3/32 (WOUND CARE) ×1 IMPLANT
DRAIN PENROSE 1/4X12 LTX STRL (WOUND CARE) IMPLANT
DRAPE SURG 17X23 STRL (DRAPES) ×2 IMPLANT
ELECT COATED BLADE 2.86 ST (ELECTRODE) ×2 IMPLANT
ELECT PAIRED SUBDERMAL (MISCELLANEOUS)
ELECT REM PT RETURN 9FT ADLT (ELECTROSURGICAL) ×2
ELECTRODE PAIRED SUBDERMAL (MISCELLANEOUS) IMPLANT
ELECTRODE REM PT RTRN 9FT ADLT (ELECTROSURGICAL) ×1 IMPLANT
EVACUATOR SILICONE 100CC (DRAIN) ×1 IMPLANT
GAUZE SPONGE 4X4 16PLY XRAY LF (GAUZE/BANDAGES/DRESSINGS) ×1 IMPLANT
GLOVE BIOGEL M 7.0 STRL (GLOVE) ×2 IMPLANT
GLOVE ECLIPSE 7.5 STRL STRAW (GLOVE) ×1 IMPLANT
GLOVE SS BIOGEL STRL SZ 6.5 (GLOVE) IMPLANT
GLOVE SUPERSENSE BIOGEL SZ 6.5 (GLOVE) ×1
GLOVE SURG SS PI 6.5 STRL IVOR (GLOVE) ×1 IMPLANT
GOWN STRL NON-REIN LRG LVL3 (GOWN DISPOSABLE) ×6 IMPLANT
KIT BASIN OR (CUSTOM PROCEDURE TRAY) ×2 IMPLANT
KIT ROOM TURNOVER OR (KITS) ×2 IMPLANT
LOCATOR NERVE 3 VOLT (DISPOSABLE) IMPLANT
NS IRRIG 1000ML POUR BTL (IV SOLUTION) ×2 IMPLANT
PAD ARMBOARD 7.5X6 YLW CONV (MISCELLANEOUS) ×4 IMPLANT
PENCIL BUTTON HOLSTER BLD 10FT (ELECTRODE) ×2 IMPLANT
PROBE NERVBE PRASS .33 (MISCELLANEOUS) IMPLANT
SPECIMEN JAR SMALL (MISCELLANEOUS) IMPLANT
STAPLER VISISTAT 35W (STAPLE) ×2 IMPLANT
SUT ETHILON 3 0 PS 1 (SUTURE) ×1 IMPLANT
SUT ETHILON 5 0 P 3 18 (SUTURE)
SUT NYLON ETHILON 5-0 P-3 1X18 (SUTURE) IMPLANT
SUT SILK 2 0 REEL (SUTURE) IMPLANT
SUT SILK 3 0 REEL (SUTURE) ×3 IMPLANT
SUT VIC AB 3-0 FS2 27 (SUTURE) ×2 IMPLANT
SUT VICRYL 4-0 PS2 18IN ABS (SUTURE) ×1 IMPLANT
TRAY ENT MC OR (CUSTOM PROCEDURE TRAY) ×2 IMPLANT
TUBE ENDOTRAC EMG 7X10.2 (MISCELLANEOUS) IMPLANT
TUBE ENDOTRAC EMG 8X11.3 (MISCELLANEOUS) IMPLANT
TUBE ENDOTRACH  EMG 6MMTUBE EN (MISCELLANEOUS)
TUBE ENDOTRACH EMG 6MMTUBE EN (MISCELLANEOUS) IMPLANT
WATER STERILE IRR 1000ML POUR (IV SOLUTION) IMPLANT

## 2011-04-30 NOTE — H&P (Signed)
Anthony Kelly is an 66 y.o. male.   Chief Complaint: Left submandibular mass HPI: Hx of high grade lymphoma involving Abd and pelvis, treated over the last 10 yrs. Pt stable. Noted several month hx of asymptomatic swelling of the Lt SM gland. CT shows mass within the gland.  Past Medical History  Diagnosis Date  . 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 Dysrhythmia     SVT sees Dr. Eldridge Dace had recent EKG at office    Past Surgical History  Procedure Date  . Lung lobectomy     right side  . Cholecystectomy   . Exploratory laparotomy   . Lymph node biopsy     in groin with removal  . Meniscus repair     right knee  . Vein ligation and stripping     right leg  . Tonsillectomy     as a child    Family History  Problem Relation Age of Onset  . Anesthesia problems Neg Hx    Social History:  reports that he quit smoking about 21 years ago. He does not have any smokeless tobacco history on file. He reports that he does not drink alcohol or use illicit drugs.  Allergies: No Known Allergies  Medications Prior to Admission  Medication Dose Route Frequency Provider Last Rate Last Dose  . ceFAZolin (ANCEF) IVPB 2 g/50 mL premix  2 g Intravenous 60 min Pre-Op Osborn Coho, MD      . dexamethasone (DECADRON) injection 10 mg  10 mg Intravenous Once Osborn Coho, MD      . DISCONTD: dexamethasone (DECADRON) injection 10 mg  10 mg Intravenous Once Osborn Coho, MD       Medications Prior to Admission  Medication Sig Dispense Refill  . aspirin 325 MG EC tablet Take 325 mg by mouth daily.      . fish oil-omega-3 fatty acids 1000 MG capsule Take 1,400 mg by mouth 2 (two) times daily.       . metoprolol succinate (TOPROL-XL) 25 MG 24 hr tablet Take 25 mg by mouth daily.      . Multiple Vitamin (MULTIVITAMIN) tablet Take 1 tablet by mouth daily.        No results found for this or any previous visit (from the past 48 hour(s)). No results found.  Review of Systems    Constitutional: Negative.   HENT: Negative.   Respiratory: Negative.   Cardiovascular: Negative.   Musculoskeletal: Negative.   Neurological: Negative.     There were no vitals taken for this visit. Physical Exam  Constitutional: He is oriented to person, place, and time. He appears well-developed and well-nourished.  HENT:  Head: Normocephalic and atraumatic.  Nose: Nose normal.  Mouth/Throat: Oropharynx is clear and moist.  Neck: Normal range of motion. Neck supple.       Firm mass Left submand gland  Cardiovascular: Normal rate and regular rhythm.   Respiratory: Effort normal and breath sounds normal.  GI: Soft. Bowel sounds are normal.  Musculoskeletal: Normal range of motion.  Neurological: He is alert and oriented to person, place, and time.     Assessment/Plan Adm for Exc of left SM gland with OWER obs.  Waneta Fitting 04/30/2011, 7:43 AM

## 2011-04-30 NOTE — Preoperative (Signed)
Beta Blockers   Reason not to administer Beta Blockers:Not Applicable 

## 2011-04-30 NOTE — Op Note (Signed)
Anthony Kelly, Anthony Kelly NO.:  [id number]  MEDICAL RECORD NO.:  [id number]  LOCATION:  5152                         FACILITY:  [facility]  PHYSICIAN:  Kinnie Scales. Annalee Genta, M.D.DATE OF BIRTH:  1945-12-08  DATE OF PROCEDURE:  04/30/2011 DATE OF DISCHARGE:                              OPERATIVE REPORT   PREOPERATIVE DIAGNOSES: 1. Left submandibular gland mass. 2. History of high-grade lymphoma.  POSTOPERATIVE DIAGNOSES: 1. Left submandibular gland mass. 2. History of high-grade lymphoma.  INDICATION FOR SURGERY: 1. Left submandibular gland mass. 2. History of high-grade lymphoma.  SURGICAL PROCEDURE:  Excision of left submandibular gland.  ANESTHESIA:  General endotracheal.  SURGEON:  Kinnie Scales. Annalee Genta, M.D.  ASSISTANTEnrigue Catena H. Pollyann Kennedy, MD.  COMPLICATIONS:  No complications.  BLOOD LOSS:  Minimal.  The patient transferred from the operating room to the recovery room in stable condition.  BRIEF HISTORY:  The patient is a 66 year old white male, who was referred to our office by Dr. Cephas Darby for evaluation of a left submandibular mass.  The patient has a history of high-grade lymphoma, has received curative therapy.  Follow up examination for an asymptomatic swelling in the left submandibular space showed a finding on CT scan consistent with a mass within the left submandibular gland. No other adenopathy was noted in the neck and no other significant findings on scanning.  The patient was asymptomatic and had noted intermittent swelling in the left submandibular space.  No associated symptoms of infection or fever, nontender, normal facial nerve function. Given his history, examination, and physical findings, I recommended that we undertake excision of the left submandibular gland for diagnostic biopsy to rule out possible recurrent lymphoma versus primary tumor of the submandibular gland.  The risks and benefits of the procedure were discussed in  detail with the patient and his wife, they understood and concurred with our plan for surgery which is scheduled under general anesthesia at [facility] with intended overnight observation.  PROCEDURE:  The patient was brought to the operating room on April 30, 2011, placed in supine position on the operating table.  General endotracheal anesthesia was established without difficulty.  The patient adequately anesthetized, he was positioned on the operating table, prepped and draped in a sterile fashion.  He was injected with 2 mL of 1% lidocaine, 1:100,000 solution of epinephrine, injected in the anterior neck, skin in the proposed incision site.  After allowing adequate time for vasoconstriction and hemostasis, the surgical procedure was begun.  With the patient prepped position and draped, procedure was begun by creating a 4 cm horizontally oriented incision in a preexisting skin crease in the left submandibular space, approximately 2 cm below the angle of the mandible.  This carried through the skin and subcutaneous tissue.  Subcutaneous fat was separated with Bovie electrocautery.  The platysma muscle was identified and divided and subplatysmal flaps were elevated superiorly and inferiorly.  The marginal mandibular branch of the left facial nerve was identified and preserved throughout its course.  Dissection was then carried out along the capsule of the submandibular gland.  There was a normal-appearing aspect of the gland and then a firm area superiorly, no associated lymphadenopathy  or mass noted in the dissection.  The gland was carefully dissected from the surrounding tissues.  The common facial vein was divided and suture ligated.  The vascular contribution of the gland also divided and individually suture ligated.  The lingual nerve was identified and its contribution of the gland was transected, the nerve itself was preserved throughout its course.  The submandibular  duct was identified and suture ligated.  The hypoglossal nerve was identified along the floor of the submandibular space, and this was preserved throughout.  The gland was then dissected and removed and sent to pathology for gross and microscopic evaluation.  The incision was irrigated with sterile saline. There was no significant bleeding.  The wound was then closed in multiple layers after placing a 7-French round drain at the depth of the wound.  Closure began with 3-0 Vicryl suture to reapproximate the platysma muscle.  Deep subcutaneous and superficial subcutaneous closure with 4-0 Vicryl suture in an interrupted fashion.  Final skin edge was closed with Dermabond surgical glue.  The patient was then awakened from his anesthetic, he was extubated, and was transferred from the operating room to the recovery room in stable condition.  There were no complications, and blood loss was minimal.          ______________________________ Kinnie Scales. Annalee Genta, M.D.     DLS/MEDQ  D:  96/04/5407  T:  04/30/2011  Job:  [id number]

## 2011-04-30 NOTE — Progress Notes (Signed)
   ENT Progress Note: s/p Procedure(s): EXCISION SUBMANDIBULAR GLAND   Subjective: Pt stable, mild discomfort  Objective: Vital signs in last 24 hours: Temp:  [97.1 F (36.2 C)-97.9 F (36.6 C)] 97.5 F (36.4 C) (04/19 1340) Pulse Rate:  [70-93] 87  (04/19 1340) Resp:  [12-21] 16  (04/19 1340) BP: (129-145)/(76-94) 145/88 mmHg (04/19 1340) SpO2:  [93 %-99 %] 95 % (04/19 1340) Weight change:  Last BM Date: 04/30/11  Intake/Output from previous day:   Intake/Output this shift: Total I/O In: 1100 [I.V.:1100] Out: 25 [Blood:25]  Labs:  Studies/Results: No results found.   PHYSICAL EXAM: Incision intact, No swelling Nl facial n.   Assessment/Plan: O/N obs, monitor JP    Anthony Kelly 04/30/2011, 5:16 PM

## 2011-04-30 NOTE — Anesthesia Postprocedure Evaluation (Signed)
  Anesthesia Post-op Note  Patient: Anthony Kelly  Procedure(s) Performed: Procedure(s) (LRB): EXCISION SUBMANDIBULAR GLAND (Left)  Patient Location: PACU  Anesthesia Type: General  Level of Consciousness: awake  Airway and Oxygen Therapy: Patient Spontanous Breathing  Post-op Pain: mild  Post-op Assessment: Post-op Vital signs reviewed, Patient's Cardiovascular Status Stable, Respiratory Function Stable, Patent Airway, No signs of Nausea or vomiting and Pain level controlled  Post-op Vital Signs: stable  Complications: No apparent anesthesia complications

## 2011-04-30 NOTE — Transfer of Care (Signed)
Immediate Anesthesia Transfer of Care Note  Patient: Anthony Kelly  Procedure(s) Performed: Procedure(s) (LRB): EXCISION SUBMANDIBULAR GLAND (Left)  Patient Location: PACU  Anesthesia Type: General  Level of Consciousness: awake, alert , oriented and patient cooperative  Airway & Oxygen Therapy: Patient Spontanous Breathing and Patient connected to nasal cannula oxygen  Post-op Assessment: Report given to PACU RN and Post -op Vital signs reviewed and stable  Post vital signs: Reviewed and stable  Complications: No apparent anesthesia complications

## 2011-04-30 NOTE — Brief Op Note (Signed)
04/30/2011  11:16 AM  PATIENT:  Anthony Kelly  66 y.o. male  PRE-OPERATIVE DIAGNOSIS:  SUBMANDIBULAR GLAND LEFT SIDE  POST-OPERATIVE DIAGNOSIS:  SUBMANDIBULAR GLAND LEFT SIDE  PROCEDURE:  Procedure(s) (LRB): EXCISION SUBMANDIBULAR GLAND (Left)  SURGEON:  Surgeon(s) and Role:    * Serena Colonel, MD - Assisting    * Osborn Coho, MD - Primary  PHYSICIAN ASSISTANT:   ASSISTANTS: Allegra Grana   ANESTHESIA:   general  EBL:  Total I/O In: 1000 [I.V.:1000] Out: - Min  BLOOD ADMINISTERED:none  DRAINS: (7 FR) Jackson-Pratt drain(s) with closed bulb suction in the incision   LOCAL MEDICATIONS USED:  LIDOCAINE  and Amount: 2 ml  SPECIMEN:  Source of Specimen:  Left submandibular gland  DISPOSITION OF SPECIMEN:  PATHOLOGY  COUNTS:  YES  TOURNIQUET:  * No tourniquets in log *  DICTATION: .Other Dictation: Dictation Number [id number]  PLAN OF CARE: Admit for overnight observation  PATIENT DISPOSITION:  PACU - hemodynamically stable.   Delay start of Pharmacological VTE agent (>24hrs) due to surgical blood loss or risk of bleeding: yes

## 2011-04-30 NOTE — Anesthesia Preprocedure Evaluation (Addendum)
Anesthesia Evaluation  Patient identified by MRN, date of birth, ID band Patient awake    Reviewed: Allergy & Precautions, H&P , NPO status , Patient's Chart, lab work & pertinent test results, reviewed documented beta blocker date and time   History of Anesthesia Complications Negative for: history of anesthetic complications  Airway Mallampati: I TM Distance: >3 FB Neck ROM: Full    Dental  (+) Edentulous Upper, Partial Lower and Dental Advisory Given   Pulmonary former smoker lobectomy   + decreased breath sounds      Cardiovascular Exercise Tolerance: Good + dysrhythmias Supra Ventricular Tachycardia     Neuro/Psych negative neurological ROS  negative psych ROS   GI/Hepatic negative GI ROS, Neg liver ROS,   Endo/Other    Renal/GU negative Renal ROS     Musculoskeletal  (+) Arthritis -, Osteoarthritis,    Abdominal (+) + obese,   Peds negative pediatric ROS (+)  Hematology negative hematology ROS (+) lymphoma   Anesthesia Other Findings   Reproductive/Obstetrics                          Anesthesia Physical Anesthesia Plan  ASA: III  Anesthesia Plan: General   Post-op Pain Management:    Induction: Intravenous  Airway Management Planned: Oral ETT  Additional Equipment:   Intra-op Plan:   Post-operative Plan: Extubation in OR  Informed Consent: I have reviewed the patients History and Physical, chart, labs and discussed the procedure including the risks, benefits and alternatives for the proposed anesthesia with the patient or authorized representative who has indicated his/her understanding and acceptance.     Plan Discussed with: CRNA and Surgeon  Anesthesia Plan Comments:         Anesthesia Quick Evaluation

## 2011-04-30 NOTE — Anesthesia Procedure Notes (Signed)
Procedure Name: Intubation Date/Time: 04/30/2011 9:36 AM Performed by: Leona Singleton A Pre-anesthesia Checklist: Patient identified Patient Re-evaluated:Patient Re-evaluated prior to inductionOxygen Delivery Method: Circle system utilized Preoxygenation: Pre-oxygenation with 100% oxygen Intubation Type: IV induction Ventilation: Mask ventilation without difficulty Laryngoscope Size: Miller and 2 Grade View: Grade I Tube type: Oral Rae Tube size: 7.5 mm Number of attempts: 1 Airway Equipment and Method: Stylet and LTA kit utilized Placement Confirmation: ETT inserted through vocal cords under direct vision,  positive ETCO2 and breath sounds checked- equal and bilateral Secured at: 22 cm Tube secured with: Tape Dental Injury: Teeth and Oropharynx as per pre-operative assessment

## 2011-05-01 MED ORDER — ALUM & MAG HYDROXIDE-SIMETH 200-200-20 MG/5ML PO SUSP
30.0000 mL | Freq: Four times a day (QID) | ORAL | Status: DC | PRN
  Administered 2011-05-01: 30 mL via ORAL

## 2011-05-01 MED ORDER — HYDROCODONE-ACETAMINOPHEN 5-325 MG PO TABS: 1.0000 | ORAL_TABLET | ORAL | Status: DC | PRN

## 2011-05-01 NOTE — Progress Notes (Signed)
Pt complaining of acid reflux/heartburn.  Notified Dr. Annalee Genta who ordered maalox prn.  Will continue to monitor.

## 2011-05-01 NOTE — Progress Notes (Signed)
Patient was given prescription, and dc instructions. Follow up appointment is already made. Home care instructions including medications, s/s of infections, and incisional care were gone over. Patient verbalized understanding of all instructions.

## 2011-05-01 NOTE — Progress Notes (Signed)
   ENT Progress Note: POD #1  s/p Procedure(s): EXCISION SUBMANDIBULAR GLAND   Subjective: Pt stable, min discomfort  Objective: Vital signs in last 24 hours: Temp:  [97.1 F (36.2 C)-98.3 F (36.8 C)] 98.2 F (36.8 C) (04/20 0605) Pulse Rate:  [72-93] 72  (04/20 0605) Resp:  [12-21] 16  (04/20 0605) BP: (116-145)/(63-94) 127/77 mmHg (04/20 0605) SpO2:  [91 %-98 %] 91 % (04/20 0605) Weight change:  Last BM Date: 04/30/11  Intake/Output from previous day: 04/19 0701 - 04/20 0700 In: 2329.2 [I.V.:2329.2] Out: 3005 [Urine:2950; Drains:30; Blood:25] Intake/Output this shift: Total I/O In: 360 [P.O.:360] Out: -   Labs:  Studies/Results: No results found.   PHYSICAL EXAM: Inc intact, no swelling or erythema JP out min, removed   Assessment/Plan: D/C to home    Anthony Kelly 05/01/2011, 9:58 AM

## 2011-05-01 NOTE — Discharge Summary (Signed)
Physician Discharge Summary  Patient ID: Anthony Kelly MRN: [id number] DOB/AGE: 1945/12/10 66 y.o.  Admit date: 04/30/2011 Discharge date: 05/01/2011  Admission Diagnoses:  Principal Problem:  *Submandibular gland mass   Discharge Diagnoses:  Same  Surgeries: Procedure(s): EXCISION SUBMANDIBULAR GLAND on 04/30/2011   Consultants: none  Discharged Condition: Improved  Hospital Course: Anthony Kelly is an 66 y.o. male who was admitted 04/30/2011 with a diagnosis of Submandibular gland mass and went to the operating room on 04/30/2011 and underwent the above named procedures.   Patient stable post op, drain removed, d/c to home on 4/20  Recent vital signs:  Filed Vitals:   05/01/11 0605  BP: 127/77  Pulse: 72  Temp: 98.2 F (36.8 C)  Resp: 16    Recent laboratory studies:  Results for orders placed during the hospital encounter of 04/26/11  SURGICAL PCR SCREEN      Component Value Range   MRSA, PCR NEGATIVE  NEGATIVE    Staphylococcus aureus NEGATIVE  NEGATIVE   BASIC METABOLIC PANEL      Component Value Range   Sodium 141  135 - 145 (mEq/L)   Potassium 4.7  3.5 - 5.1 (mEq/L)   Chloride 106  96 - 112 (mEq/L)   CO2 26  19 - 32 (mEq/L)   Glucose, Bld 76  70 - 99 (mg/dL)   BUN 15  6 - 23 (mg/dL)   Creatinine, Ser 4.09  0.50 - 1.35 (mg/dL)   Calcium 9.3  8.4 - 81.1 (mg/dL)   GFR calc non Af Amer 76 (*) >90 (mL/min)   GFR calc Af Amer 89 (*) >90 (mL/min)  CBC      Component Value Range   WBC 7.1  4.0 - 10.5 (K/uL)   RBC 5.61  4.22 - 5.81 (MIL/uL)   Hemoglobin 17.1 (*) 13.0 - 17.0 (g/dL)   HCT 91.4  78.2 - 95.6 (%)   MCV 87.3  78.0 - 100.0 (fL)   MCH 30.5  26.0 - 34.0 (pg)   MCHC 34.9  30.0 - 36.0 (g/dL)   RDW 21.3  08.6 - 57.8 (%)   Platelets 170  150 - 400 (K/uL)    Discharge Medications:   Medication List  As of 05/01/2011  9:59 AM   STOP taking these medications         aspirin 325 MG EC tablet         TAKE these medications         ACIDOPHILUS/BIFIDUS  PO   Take 1 tablet by mouth daily.      fish oil-omega-3 fatty acids 1000 MG capsule   Take 1,400 mg by mouth 2 (two) times daily.      HYDROcodone-acetaminophen 5-325 MG per tablet   Commonly known as: NORCO   Take 1-2 tablets by mouth every 4 (four) hours as needed for pain.      metoprolol succinate 25 MG 24 hr tablet   Commonly known as: TOPROL-XL   Take 25 mg by mouth daily.      multivitamin tablet   Take 1 tablet by mouth daily.      nabumetone 500 MG tablet   Commonly known as: RELAFEN   Take 500 mg by mouth 2 (two) times daily as needed. For pain            Diagnostic Studies: Dg Chest 2 View  04/26/2011  *RADIOLOGY REPORT*  Clinical Data: Preop  CHEST - 2 VIEW  Comparison: CT of the chest 10/20/2010  Findings:  Cardiomediastinal silhouette is stable.  Postsurgical changes are noted in the right lower lobe medially.  Tortuous thoracic aorta.  Small hiatal hernia.  Mild degenerative changes thoracic spine.  No acute infiltrate or pulmonary edema.  IMPRESSION: No active disease.  Small hiatal hernia.  Postsurgical changes right lower lobe.  Original Report Authenticated By: Natasha Mead, M.D.    Disposition:   Discharge Orders    Future Appointments: Provider: Department: Dept Phone: Center:   05/11/2011 10:30 AM Levert Feinstein, MD [facility] [phone] None     Future Orders Please Complete By Expires   Diet - low sodium heart healthy      Increase activity slowly      Discharge instructions      Comments:   1. Limited activity 2. Liquid and soft diet 3. May bathe and shower 4. Elevate Head of Bed 5. No ointment to incision      Follow-up Information    Follow up with Tylik Treese, MD in 10 days.   Contact information:   [address], [address] [place] [address] [phone]           Signed: Osborn Coho 05/01/2011, 9:59 AM

## 2011-05-03 ENCOUNTER — Encounter (HOSPITAL_COMMUNITY): Payer: Self-pay | Admitting: Otolaryngology

## 2011-05-03 MED FILL — Hydromorphone HCl Inj 1 MG/ML: INTRAMUSCULAR | Qty: 1 | Status: AC

## 2011-05-11 ENCOUNTER — Telehealth: Payer: Self-pay | Admitting: Oncology

## 2011-05-11 ENCOUNTER — Ambulatory Visit (HOSPITAL_BASED_OUTPATIENT_CLINIC_OR_DEPARTMENT_OTHER): Payer: Medicare Other | Admitting: Oncology

## 2011-05-11 ENCOUNTER — Encounter: Payer: Self-pay | Admitting: Oncology

## 2011-05-11 VITALS — BP 117/76 | HR 64 | Temp 97.4°F | Ht 72.0 in | Wt 205.8 lb

## 2011-05-11 DIAGNOSIS — C8590 Non-Hodgkin lymphoma, unspecified, unspecified site: Secondary | ICD-10-CM

## 2011-05-11 DIAGNOSIS — C8280 Other types of follicular lymphoma, unspecified site: Secondary | ICD-10-CM | POA: Insufficient documentation

## 2011-05-11 DIAGNOSIS — Z8572 Personal history of non-Hodgkin lymphomas: Secondary | ICD-10-CM | POA: Insufficient documentation

## 2011-05-11 DIAGNOSIS — C8581 Other specified types of non-Hodgkin lymphoma, lymph nodes of head, face, and neck: Secondary | ICD-10-CM

## 2011-05-11 DIAGNOSIS — C8510 Unspecified B-cell lymphoma, unspecified site: Secondary | ICD-10-CM

## 2011-05-11 DIAGNOSIS — D45 Polycythemia vera: Secondary | ICD-10-CM

## 2011-05-11 DIAGNOSIS — K118 Other diseases of salivary glands: Secondary | ICD-10-CM

## 2011-05-11 HISTORY — DX: Unspecified B-cell lymphoma, unspecified site: C85.10

## 2011-05-11 NOTE — Progress Notes (Signed)
Hematology and Oncology Follow Up Visit  Anthony Kelly [id number] [birth year] 66 y.o. 05/11/2011 8:26 PM   Principle Diagnosis: Encounter Diagnoses  Name Primary?  . 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 Low grade B-cell lymphoma   . Submandibular gland mass      Interim History:  Short interim followup visit for this pleasant 66 year old man to review results of recent incisional biopsy of the left submandibular gland. He has a long-standing history of lymphoma with both nodal and extranodal disease. He recently developed a painless swelling in the area of the left submandibular gland. CT scans of the neck, chest, abdomen, and pelvis done on 03/24/2011 which I personally reviewed with the radiologist, showed a single area of abnormality with a 1.2 x 0.8 cm mass lesion that appeared to be within the left submandibular gland. Parotid glands appeared normal. No other malignant adenopathy. He was referred to Dr. Osborn Coho in her nose and throat surgeon. He underwent excision of the left submandibular gland on April 19. Pathology showed a low-grade B-cell non-Hodgkin's lymphoma follicular grade 1-2 CD20 and CD79a positive. 2 small adjacent lymph nodes submitted as well which showed focal atypical lymphoid proliferation.  Medications: reviewed  Allergies: No Known Allergies  Review of Systems: Constitutional:  No constitutional symptoms  Respiratory: No cough or dyspnea Cardiovascular:  No chest pain or palpitations Gastrointestinal: No abdominal pain or change in bowel habit Genito-Urinary: No urinary tract symptoms Musculoskeletal: No muscle or bone pain other than that related to arthritis Neurologic: No headache or change in vision Skin: No rash or ecchymosis Remaining ROS negative.  Physical Exam: Blood pressure 117/76, pulse 64, temperature 97.4 F (36.3 C), temperature source Oral, height 6' (1.829 m), weight 205 lb 12.8 oz (93.35 kg). Wt Readings from Last 3 Encounters:    05/11/11 205 lb 12.8 oz (93.35 kg)  05/01/11 208 lb 15.9 oz (94.8 kg)  05/01/11 208 lb 15.9 oz (94.8 kg)     General appearance: Well-nourished Caucasian man HENNT: Well-healed scar left submandibular area Lymph nodes: No cervical supraclavicular axillary or inguinal adenopathy Breasts: Lungs: Clear to auscultation resonant to percussion Heart: Regular rhythm Abdomen: Soft nontender no mass no organomegaly Extremities: No edema no calf tenderness Vascular: No cyanosis  Neurologic: Mental status intact, cranial nerves intact, motor strength 5 over 5, reflexes absent symmetric at the knees, 1+ symmetric at the biceps Skin: No rash or ecchymosis  Lab Results: Lab Results  Component Value Date   WBC 7.1 04/26/2011   HGB 17.1* 04/26/2011   HCT 49.0 04/26/2011   MCV 87.3 04/26/2011   PLT 170 04/26/2011     Chemistry      Component Value Date/Time   NA 141 04/26/2011 0941   NA 142 10/20/2010 1000   K 4.7 04/26/2011 0941   K 4.2 10/20/2010 1000   CL 106 04/26/2011 0941   CL 102 10/20/2010 1000   CO2 26 04/26/2011 0941   CO2 26 10/20/2010 1000   BUN 15 04/26/2011 0941   BUN 11 10/20/2010 1000   CREATININE 1.00 04/26/2011 0941   CREATININE 1.0 10/20/2010 1000      Component Value Date/Time   CALCIUM 9.3 04/26/2011 0941   CALCIUM 9.0 10/20/2010 1000   ALKPHOS 59 03/16/2011 1522   ALKPHOS 57 10/20/2010 1000   AST 23 03/16/2011 1522   AST 30 10/20/2010 1000   ALT 31 03/16/2011 1522   BILITOT 0.4 03/16/2011 1522   BILITOT 0.80 10/20/2010 1000      1. Impression:  Third of progression of non-Hodgkin's lymphoma with an unusual small malignant nodule within the left submandibular gland treated with total excision of the gland. 2. Initial low-grade B-cell non-Hodgkin's lymphoma presenting with inguinal adenopathy in June 2004.  Progressive enlargement of lymph nodes with repeat biopsy November 2005 showing conversion to   high grade.  Treated with CHOP/Rituxan chemotherapy with complete remission.   Development of a pulmonary parenchymal lesion in November 2008.  Treated with surgical resection on 12/19/2006.  Histology low grade.  Treated with adjuvant Rituxan x2 years between 12/2006 and 08/2008.          Given current minimal involvement, complete surgical excision, and negative presurgery CT scans, I feel observation alone is the most appropriate strategy at this time. He will continue to be monitored clinically and radiographically. I will repeat scans again at the end of July which will be a 4 month interval compared with his most recent studies. I again reviewed with him and his wife that this was actually a good outcome. We did not find a second primary malignancy that would require more aggressive treatment. The fact that this was low-grade and not high-grade histology is also favorable. He understands that we will likely see the lymphoma recur again in the future but I encouraged him that we have many new modalities that we can use to treat him with  if necessary. If he develops measurable disease and the clinical trial is still open, I would encourage him to participate in the Rituxan versus Arzerra trial. Suzan Nailer is a new anti-CD20 antibody that appears to have increased activity over Rituxan. 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   2. .Chronic polycythemia with normal erythropoietin level and low ESR.  Nonsmoker.        CC:. Dr. Meredith Mody; Dr. Osborn Coho   Levert Feinstein, MD 4/30/20138:26 PM

## 2011-05-11 NOTE — Telephone Encounter (Signed)
appts made and printed for pt aom °

## 2011-07-20 ENCOUNTER — Ambulatory Visit (HOSPITAL_COMMUNITY)
Admission: RE | Admit: 2011-07-20 | Discharge: 2011-07-20 | Disposition: A | Discharge status: Home / Self Care | Payer: Medicare Other | Source: Ambulatory Visit | Attending: Oncology | Admitting: Oncology

## 2011-07-20 ENCOUNTER — Encounter (HOSPITAL_COMMUNITY): Payer: Self-pay

## 2011-07-20 ENCOUNTER — Other Ambulatory Visit (HOSPITAL_BASED_OUTPATIENT_CLINIC_OR_DEPARTMENT_OTHER): Payer: Medicare Other | Admitting: Lab

## 2011-07-20 DIAGNOSIS — C8581 Other specified types of non-Hodgkin lymphoma, lymph nodes of head, face, and neck: Secondary | ICD-10-CM

## 2011-07-20 DIAGNOSIS — Q619 Cystic kidney disease, unspecified: Secondary | ICD-10-CM | POA: Insufficient documentation

## 2011-07-20 DIAGNOSIS — M47812 Spondylosis without myelopathy or radiculopathy, cervical region: Secondary | ICD-10-CM | POA: Insufficient documentation

## 2011-07-20 DIAGNOSIS — K118 Other diseases of salivary glands: Secondary | ICD-10-CM

## 2011-07-20 DIAGNOSIS — Z9089 Acquired absence of other organs: Secondary | ICD-10-CM | POA: Insufficient documentation

## 2011-07-20 DIAGNOSIS — J438 Other emphysema: Secondary | ICD-10-CM | POA: Insufficient documentation

## 2011-07-20 DIAGNOSIS — C8589 Other specified types of non-Hodgkin lymphoma, extranodal and solid organ sites: Secondary | ICD-10-CM | POA: Insufficient documentation

## 2011-07-20 DIAGNOSIS — R918 Other nonspecific abnormal finding of lung field: Secondary | ICD-10-CM | POA: Diagnosis not present

## 2011-07-20 DIAGNOSIS — R599 Enlarged lymph nodes, unspecified: Secondary | ICD-10-CM | POA: Insufficient documentation

## 2011-07-20 DIAGNOSIS — K7689 Other specified diseases of liver: Secondary | ICD-10-CM | POA: Insufficient documentation

## 2011-07-20 DIAGNOSIS — C8510 Unspecified B-cell lymphoma, unspecified site: Secondary | ICD-10-CM

## 2011-07-20 DIAGNOSIS — C8590 Non-Hodgkin lymphoma, unspecified, unspecified site: Secondary | ICD-10-CM

## 2011-07-20 HISTORY — DX: Secondary and unspecified malignant neoplasm of lymph node, unspecified: C77.9

## 2011-07-20 HISTORY — DX: Secondary malignant neoplasm of unspecified lung: C78.00

## 2011-07-20 LAB — CBC WITH DIFFERENTIAL/PLATELET
BASO%: 0.8 % (ref 0.0–2.0)
EOS%: 2.3 % (ref 0.0–7.0)
MCH: 30.4 pg (ref 27.2–33.4)
MCHC: 34 g/dL (ref 32.0–36.0)
MONO#: 0.6 10*3/uL (ref 0.1–0.9)
NEUT%: 67.4 % (ref 39.0–75.0)
RBC: 5.64 10*6/uL (ref 4.20–5.82)
RDW: 13.1 % (ref 11.0–14.6)
WBC: 6.4 10*3/uL (ref 4.0–10.3)
lymph#: 1.3 10*3/uL (ref 0.9–3.3)

## 2011-07-20 LAB — LACTATE DEHYDROGENASE: LDH: 129 U/L (ref 94–250)

## 2011-07-20 LAB — CMP (CANCER CENTER ONLY)
ALT(SGPT): 29 U/L (ref 10–47)
CO2: 29 mEq/L (ref 18–33)
Calcium: 8.7 mg/dL (ref 8.0–10.3)
Chloride: 100 mEq/L (ref 98–108)
Creat: 1.2 mg/dl (ref 0.6–1.2)
Total Protein: 7.2 g/dL (ref 6.4–8.1)

## 2011-07-20 IMAGING — CT CT ABD-PELV W/ CM
3 of 9 series · 13 of 46 positions shown, 17 images · IV contrast (OMNIPAQUE)
Comparison: Prior CTs [DATE] and [DATE].

CT NECK

CLINICAL DATA: Non-Hodgkins lymphoma in relapse. Chemotherapy
completed 5 months ago.

CT NECK, CHEST, ABDOMEN AND PELVIS WITH CONTRAST
TECHNIQUE: Multidetector CT imaging of the neck, chest, abdomen
and pelvis was performed using the standard protocol following the
bolus administration of intravenous contrast.
Contrast: 100mL OMNIPAQUE IOHEXOL 300 MG/ML  SOLN

[Series 2: cap st · axial · 0.76mm/px · z∈[-324,+236]mm · 11 of 136 slices shown, 14 images]
[im 12/136  soft-tissue]
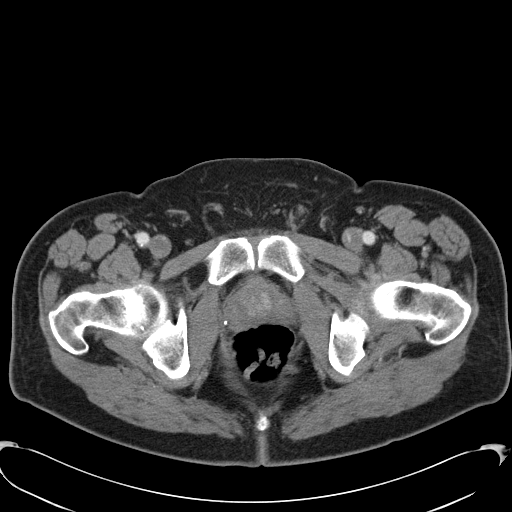
[im 12/136  bone]
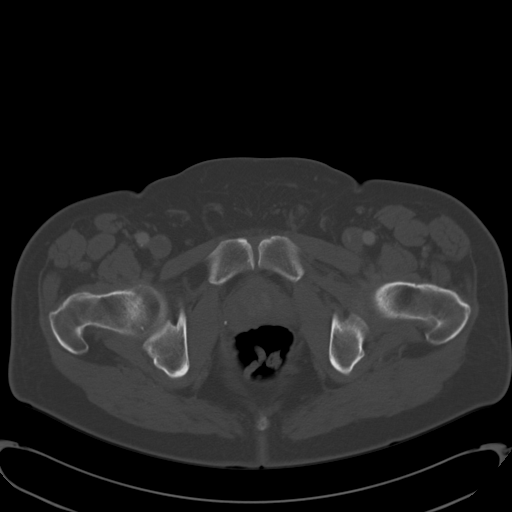
[im 23/136  bone]
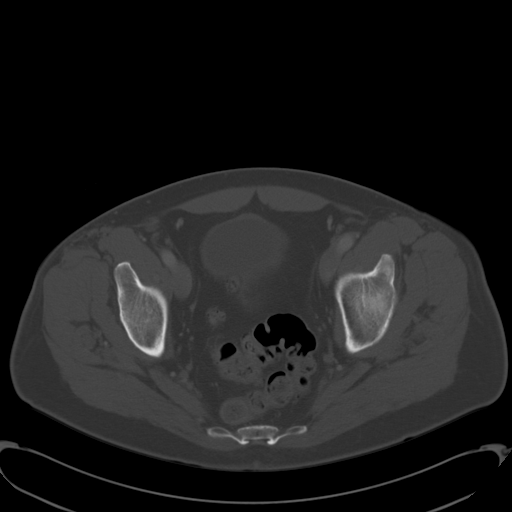
[im 34/136  bone]
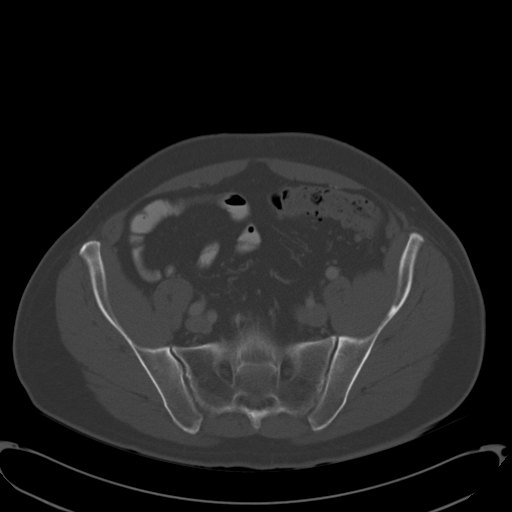
[im 46/136  bone]
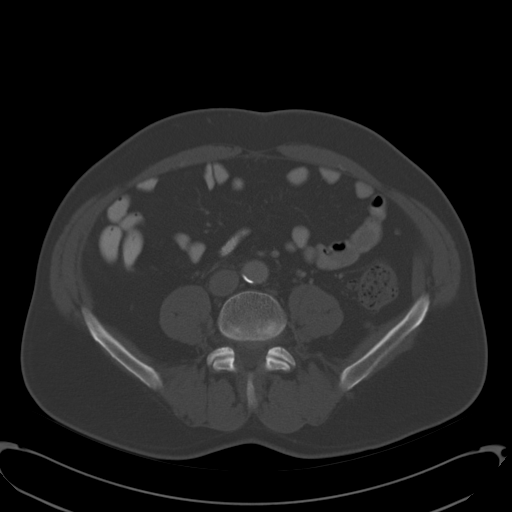
[im 57/136  soft-tissue]
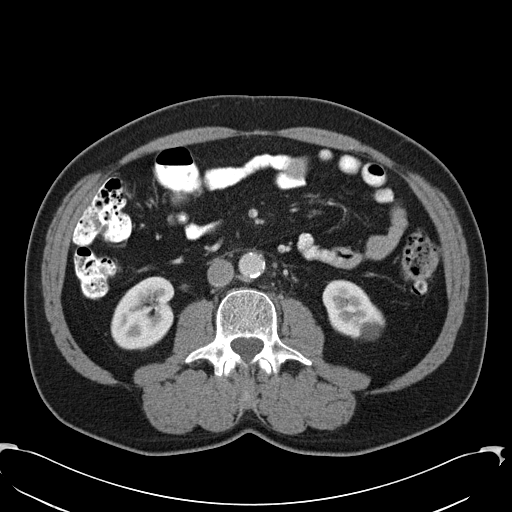
[im 57/136  bone]
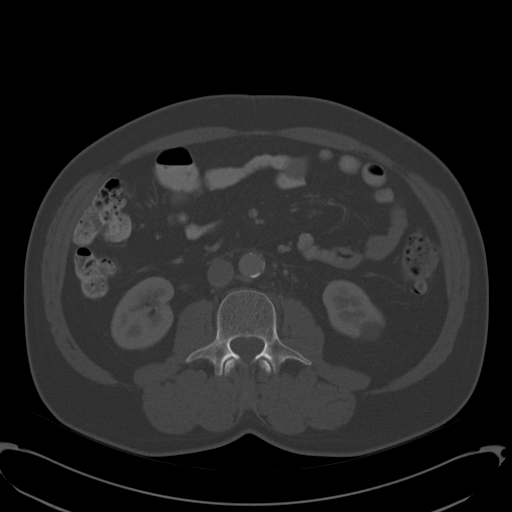
[im 68/136  bone]
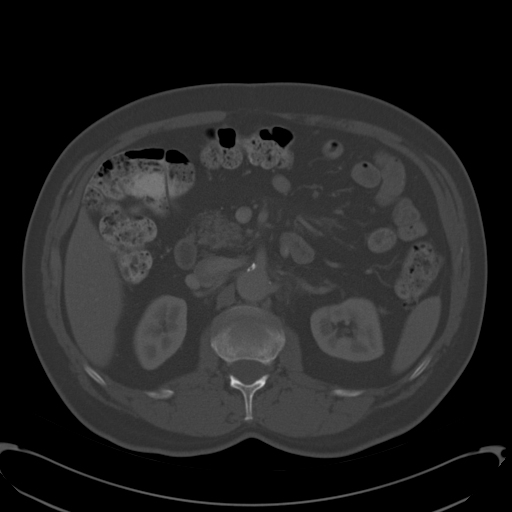
[im 79/136  bone]
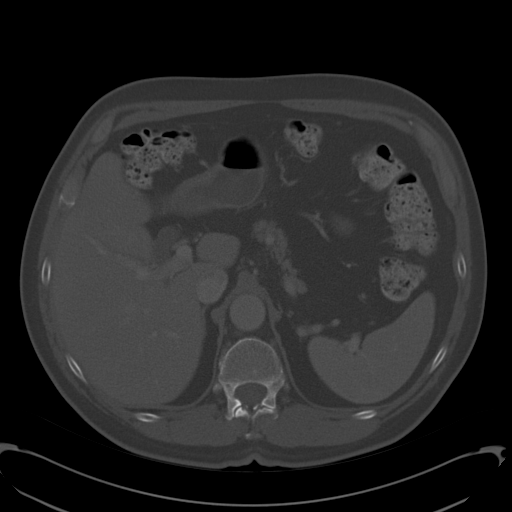
[im 91/136  bone]
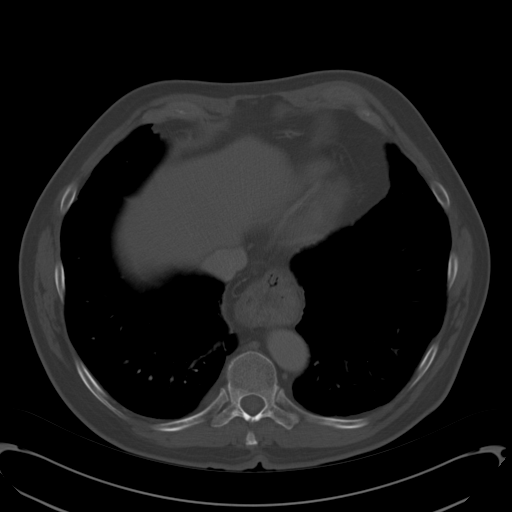
[im 102/136  soft-tissue]
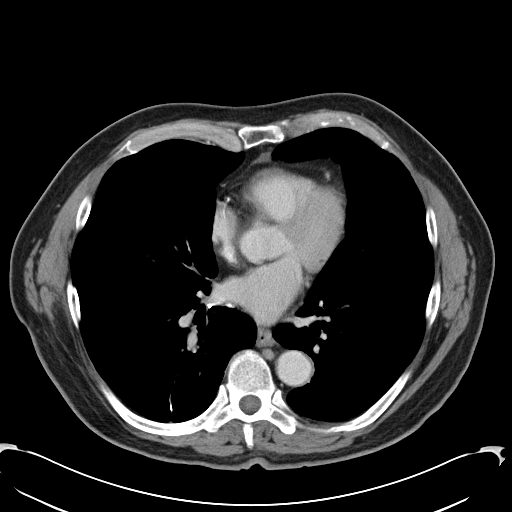
[im 102/136  bone]
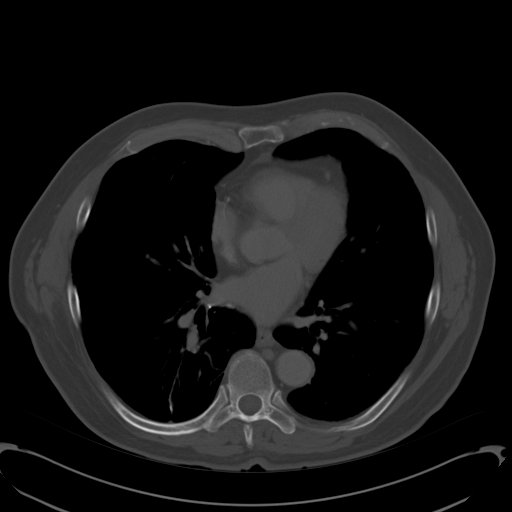
[im 113/136  bone]
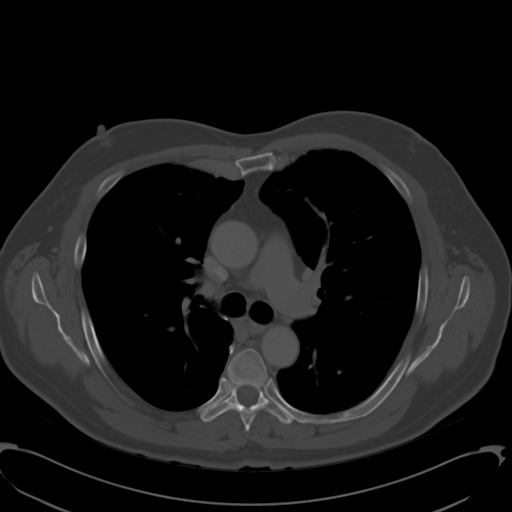
[im 124/136  bone]
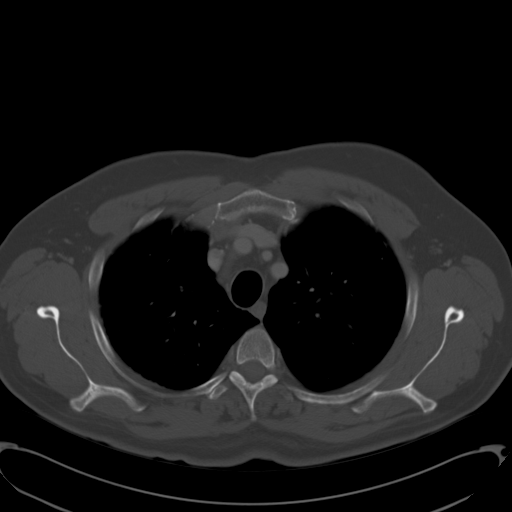

[Series 604: <mpr thick range(2)> · coronal · 0.50mm/px · 1 of 60 slices shown]
[im 30/60  bone]
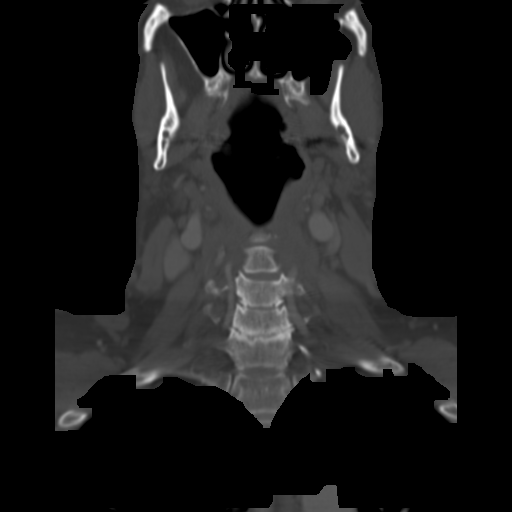

[Series 606: <mpr thick range(4)> · sagittal · 1.32mm/px · 1 of 123 slices shown, 2 images]
[im 62/123  soft-tissue]
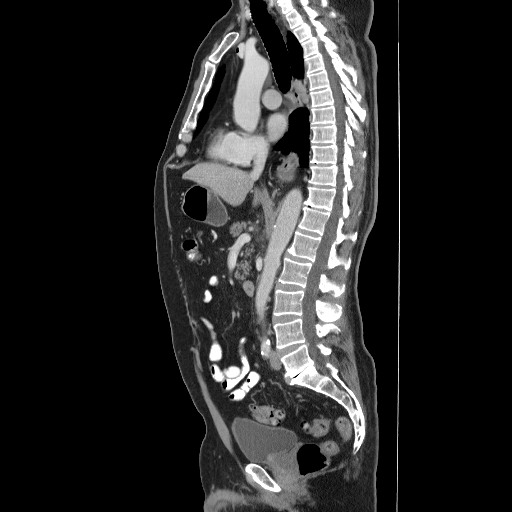
[im 62/123  bone]
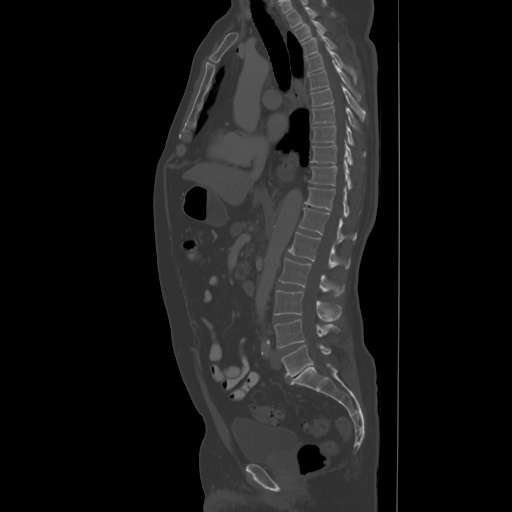

[13 of 46 positions shown; findings below may reference images not displayed]

FINDINGS: There are postsurgical changes related to interval
excision of the left submandibular gland.  No residual or recurrent
mass is identified in the left submandibular space. The right
submandibular and both parotid glands appear normal.

There are no enlarged cervical lymph nodes. Small submental lymph
nodes are unchanged.  No lesions of the pharyngeal mucosal space
are identified.  The visualized paranasal sinuses and orbital
contents appear normal.  The thyroid gland appears normal. There is
stable cervical spondylosis.
IMPRESSION: 1.  Interval excision of the left parotid gland.
2.  No residual or recurrent submandibular mass identified.
3.  Stable submental lymph nodes.  No pathologically enlarged
cervical lymph nodes.

CT CHEST
FINDINGS: Small mediastinal lymph nodes are unchanged.  There are
no pathologically enlarged mediastinal, hilar or axillary lymph
nodes.  Coronary artery calcifications and a moderate sized hiatal
hernia are again noted.

There are stable postsurgical changes status post right lower lobe
wedge resection.  The lungs appear stable with mild underlying
centrilobular emphysema and scattered scarring.  Small subpleural
nodules in the left upper lobe (image 25) and in the left lower
lobe (image 43) are unchanged.  There is no pleural or pericardial
effusion.

There are no suspicious osseous findings.  Again demonstrated are
dilated nerve root sheaths on the right at T10-T11, bilaterally at
T11-T12 and on the left at T12-L1, unchanged.
IMPRESSION: Stable postoperative chest CT.  No evidence of recurrent lymphoma.

CT ABDOMEN AND PELVIS
FINDINGS: There is stable mild biliary dilatation status post
cholecystectomy.  A cyst in the left hepatic lobe is stable.  The
spleen, pancreas and adrenal glands appear normal.

There is a stable 2.0 cm cyst lower pole of the left kidney.  The
right kidney appears normal.  There is no hydronephrosis.

There is stable soft tissue stranding in the base of the mesentery
around the celiac axis, attributed to treated lymphoma.  No
enlarged mesenteric or retroperitoneal lymph nodes are identified.
There is no pelvic adenopathy.  However, there has been definite
enlargement of a left inguinal node, measuring 12 mm short axis on
image 131.

Scattered vascular calcifications and diverticular changes of the
sigmoid colon are stable.  There is stable enlargement of the
prostate gland which protrudes into the bladder base.  There are
stable degenerative changes within the lower lumbar spine.
IMPRESSION: 1.  Nonspecific enlargement of a single left inguinal lymph node.
This could reflect a reactive node, especially if there has been
trauma or inflammation in the left lower extremity.  Recurrent
lymphoma cannot be excluded.
2.  No other abdominal pelvic lymphadenopathy.  Chronic soft tissue
stranding in the base of the mesentery is stable.
3.  Stable mild biliary dilatation status post cholecystectomy.

## 2011-07-20 MED ORDER — IOHEXOL 300 MG/ML  SOLN
100.0000 mL | Freq: Once | INTRAMUSCULAR | Status: AC | PRN
  Administered 2011-07-20: 100 mL via INTRAVENOUS

## 2011-07-21 ENCOUNTER — Telehealth: Payer: Self-pay | Admitting: *Deleted

## 2011-07-21 NOTE — Telephone Encounter (Signed)
Message copied by Sabino Snipes on Wed Jul 21, 2011 11:03 AM ------      Message from: Levert Feinstein      Created: Tue Jul 20, 2011  8:56 PM       Call pt CT negative

## 2011-07-21 NOTE — Telephone Encounter (Signed)
Pt notified of CT results per Dr Granfortuna.  

## 2011-07-27 ENCOUNTER — Telehealth: Payer: Self-pay | Admitting: Oncology

## 2011-07-27 ENCOUNTER — Ambulatory Visit (HOSPITAL_BASED_OUTPATIENT_CLINIC_OR_DEPARTMENT_OTHER): Payer: Medicare Other | Admitting: Oncology

## 2011-07-27 VITALS — BP 107/71 | HR 76 | Temp 97.0°F | Ht 72.0 in | Wt 202.7 lb

## 2011-07-27 DIAGNOSIS — D45 Polycythemia vera: Secondary | ICD-10-CM | POA: Diagnosis not present

## 2011-07-27 DIAGNOSIS — C8590 Non-Hodgkin lymphoma, unspecified, unspecified site: Secondary | ICD-10-CM

## 2011-07-27 DIAGNOSIS — C8510 Unspecified B-cell lymphoma, unspecified site: Secondary | ICD-10-CM

## 2011-07-27 DIAGNOSIS — K118 Other diseases of salivary glands: Secondary | ICD-10-CM

## 2011-07-27 DIAGNOSIS — C8581 Other specified types of non-Hodgkin lymphoma, lymph nodes of head, face, and neck: Secondary | ICD-10-CM | POA: Diagnosis not present

## 2011-07-27 NOTE — Telephone Encounter (Signed)
appts made and printed for pt aom °

## 2011-07-28 NOTE — Progress Notes (Signed)
Hematology and Oncology Follow Up Visit  Anthony Kelly [id number] 24-Nov-1945 66 y.o. 07/28/2011 10:46 AM   Principle Diagnosis: Encounter Diagnoses  Name Primary?  . Malignant lymphoma, high grade Yes  . Low grade B-cell lymphoma   . Submandibular gland mass      Interim History:  Short interim followup visit for this pleasant 66 year old man who initially presented with left inguinal lymphadenopathy in June 2004 with biopsy showing low grade non-Hodgkin's lymphoma. He was treated with observation alone until November 2005 when the lymph node mass began to grow and repeat biopsy showed conversion to high-grade lymphoma. He achieved a complete remission with [facility] Rituxan. He developed a single right pulmonary lesion in November 2008 found on a routine followup scan. This was surgically resected 12/19/2006 and showed low-grade non-Hodgkin's lymphoma. He was treated with Rituxan intermittently for 2 years between December 2008 in August 2010. At time of a routine followup visit in March of this year he pointed out an enlarged left submandibular gland which she had noted for about one month. CT scans of the neck chest abdomen and pelvis showed a isolated lesion within the left submandibular gland. He was referred to your nose and throat surgery and underwent excision of the gland on 04/30/2011 by Dr. Osborn Coho. Findings were consistent with an isolated focus of low-grade non-Hodgkin's lymphoma. Since the remainder of his restaging evaluation was unremarkable and the only site of obvious disease was surgically removed, I did not feel that we needed to put him back on any specific treatment.  He is doing well at this time. He has noted no new areas of lymph gland enlargement. He has now retired. He has lost some weight but he attributes this to doing a lot of work around his house. Appetite is good. No interim fever or infection.  CT scans of the neck chest abdomen and pelvis done in anticipation  of today's visit on 07/20/2011 which I personally reviewed shows no evidence for new or progressive disease.  Medications: reviewed  Allergies: No Known Allergies  Review of Systems: Constitutional:   See above Respiratory: No cough or dyspnea Cardiovascular: No chest pain or palpitations   Gastrointestinal: No change in bowel habit Genito-Urinary: No urinary tract symptoms Musculoskeletal: No muscle or bone pain Neurologic: No headache or change in vision Skin: No rash or ecchymosis Remaining ROS negative.  Physical Exam: Blood pressure 107/71, pulse 76, temperature 97 F (36.1 C), temperature source Oral, height 6' (1.829 m), weight 202 lb 11.2 oz (91.944 kg). Wt Readings from Last 3 Encounters:  07/27/11 202 lb 11.2 oz (91.944 kg)  05/11/11 205 lb 12.8 oz (93.35 kg)  05/01/11 208 lb 15.9 oz (94.8 kg)     General appearance: Well-nourished Caucasian man HENNT: Pharynx no erythema or exudate. No mass. Healed scar in left submandibular region. Lymph nodes: No cervical, supraclavicular, axillary, or inguinal lymphadenopathy Breasts: Lungs: Clear to auscultation resonant to percussion Heart: Regular rhythm no murmur or gallop Abdomen: Soft nontender no mass no organomegaly Extremities: No edema no calf tenderness Vascular: No cyanosis Neurologic: Cranial nerves grossly normal, motor strength 5 over 5, reflexes 2+ symmetric. Skin: No rash or ecchymosis  Lab Results: Lab Results  Component Value Date   WBC 6.4 07/20/2011   HGB 17.2* 07/20/2011   HCT 50.5* 07/20/2011   MCV 89.5 07/20/2011   PLT 183 07/20/2011     Chemistry      Component Value Date/Time   NA 139 07/20/2011 0800   NA 141  04/26/2011 0941   K 4.4 07/20/2011 0800   K 4.7 04/26/2011 0941   CL 100 07/20/2011 0800   CL 106 04/26/2011 0941   CO2 29 07/20/2011 0800   CO2 26 04/26/2011 0941   BUN 14 07/20/2011 0800   BUN 15 04/26/2011 0941   CREATININE 1.2 07/20/2011 0800   CREATININE 1.00 04/26/2011 0941      Component Value  Date/Time   CALCIUM 8.7 07/20/2011 0800   CALCIUM 9.3 04/26/2011 0941   ALKPHOS 55 07/20/2011 0800   ALKPHOS 59 03/16/2011 1522   AST 25 07/20/2011 0800   AST 23 03/16/2011 1522   ALT 31 03/16/2011 1522   BILITOT 1.00 07/20/2011 0800   BILITOT 0.4 03/16/2011 1522       Radiological Studies: Ct Soft Tissue Neck W Contrast  07/20/2011  *RADIOLOGY REPORT*  Clinical Data:  Non-Hodgkins lymphoma in relapse. Chemotherapy completed 5 months ago.  CT NECK, CHEST, ABDOMEN AND PELVIS WITH CONTRAST  Technique:  Multidetector CT imaging of the neck, chest, abdomen and pelvis was performed using the standard protocol following the bolus administration of intravenous contrast.  Contrast: OMNIPAQUE IOHEXOL 300 MG/ML  SOLN  Comparison:  Prior CTs 10/20/2010 and 03/24/2011.  CT NECK  Findings:  There are postsurgical changes related to interval excision of the left submandibular gland.  No residual or recurrent mass is identified in the left submandibular space. The right submandibular and both parotid glands appear normal.  There are no enlarged cervical lymph nodes. Small submental lymph nodes are unchanged.  No lesions of the pharyngeal mucosal space are identified.  The visualized paranasal sinuses and orbital contents appear normal.  The thyroid gland appears normal. There is stable cervical spondylosis.  IMPRESSION:  1.  Interval excision of the left parotid gland. 2.  No residual or recurrent submandibular mass identified. 3.  Stable submental lymph nodes.  No pathologically enlarged cervical lymph nodes.  CT CHEST  Findings: Small mediastinal lymph nodes are unchanged.  There are no pathologically enlarged mediastinal, hilar or axillary lymph nodes.  Coronary artery calcifications and a moderate sized hiatal hernia are again noted.  There are stable postsurgical changes status post right lower lobe wedge resection.  The lungs appear stable with mild underlying centrilobular emphysema and scattered scarring.  Small  subpleural nodules in the left upper lobe (image 25) and in the left lower lobe (image 43) are unchanged.  There is no pleural or pericardial effusion.  There are no suspicious osseous findings.  Again demonstrated are dilated nerve root sheaths on the right at T10-T11, bilaterally at T11-T12 and on the left at T12-L1, unchanged.  IMPRESSION: Stable postoperative chest CT.  No evidence of recurrent lymphoma.  CT ABDOMEN AND PELVIS  Findings: There is stable mild biliary dilatation status post cholecystectomy.  A cyst in the left hepatic lobe is stable.  The spleen, pancreas and adrenal glands appear normal.  There is a stable 2.0 cm cyst lower pole of the left kidney.  The right kidney appears normal.  There is no hydronephrosis.  There is stable soft tissue stranding in the base of the mesentery around the celiac axis, attributed to treated lymphoma.  No enlarged mesenteric or retroperitoneal lymph nodes are identified. There is no pelvic adenopathy.  However, there has been definite enlargement of a left inguinal node, measuring 12 mm short axis on image 131.  Scattered vascular calcifications and diverticular changes of the sigmoid colon are stable.  There is stable enlargement of the prostate  gland which protrudes into the bladder base.  There are stable degenerative changes within the lower lumbar spine.  IMPRESSION:  1.  Nonspecific enlargement of a single left inguinal lymph node. This could reflect a reactive node, especially if there has been trauma or inflammation in the left lower extremity.  Recurrent lymphoma cannot be excluded. 2.  No other abdominal pelvic lymphadenopathy.  Chronic soft tissue stranding in the base of the mesentery is stable. 3.  Stable mild biliary dilatation status post cholecystectomy.  Original Report Authenticated By: Gerrianne Scale, M.D.   Ct Chest W Contrast  07/20/2011  *RADIOLOGY REPORT*  Clinical Data:  Non-Hodgkins lymphoma in relapse. Chemotherapy completed 5 months  ago.  CT NECK, CHEST, ABDOMEN AND PELVIS WITH CONTRAST  Technique:  Multidetector CT imaging of the neck, chest, abdomen and pelvis was performed using the standard protocol following the bolus administration of intravenous contrast.  Contrast: OMNIPAQUE IOHEXOL 300 MG/ML  SOLN  Comparison:  Prior CTs 10/20/2010 and 03/24/2011.  CT NECK  Findings:  There are postsurgical changes related to interval excision of the left submandibular gland.  No residual or recurrent mass is identified in the left submandibular space. The right submandibular and both parotid glands appear normal.  There are no enlarged cervical lymph nodes. Small submental lymph nodes are unchanged.  No lesions of the pharyngeal mucosal space are identified.  The visualized paranasal sinuses and orbital contents appear normal.  The thyroid gland appears normal. There is stable cervical spondylosis.  IMPRESSION:  1.  Interval excision of the left parotid gland. 2.  No residual or recurrent submandibular mass identified. 3.  Stable submental lymph nodes.  No pathologically enlarged cervical lymph nodes.  CT CHEST  Findings: Small mediastinal lymph nodes are unchanged.  There are no pathologically enlarged mediastinal, hilar or axillary lymph nodes.  Coronary artery calcifications and a moderate sized hiatal hernia are again noted.  There are stable postsurgical changes status post right lower lobe wedge resection.  The lungs appear stable with mild underlying centrilobular emphysema and scattered scarring.  Small subpleural nodules in the left upper lobe (image 25) and in the left lower lobe (image 43) are unchanged.  There is no pleural or pericardial effusion.  There are no suspicious osseous findings.  Again demonstrated are dilated nerve root sheaths on the right at T10-T11, bilaterally at T11-T12 and on the left at T12-L1, unchanged.  IMPRESSION: Stable postoperative chest CT.  No evidence of recurrent lymphoma.  CT ABDOMEN AND PELVIS   Findings: There is stable mild biliary dilatation status post cholecystectomy.  A cyst in the left hepatic lobe is stable.  The spleen, pancreas and adrenal glands appear normal.  There is a stable 2.0 cm cyst lower pole of the left kidney.  The right kidney appears normal.  There is no hydronephrosis.  There is stable soft tissue stranding in the base of the mesentery around the celiac axis, attributed to treated lymphoma.  No enlarged mesenteric or retroperitoneal lymph nodes are identified. There is no pelvic adenopathy.  However, there has been definite enlargement of a left inguinal node, measuring 12 mm short axis on image 131.  Scattered vascular calcifications and diverticular changes of the sigmoid colon are stable.  There is stable enlargement of the prostate gland which protrudes into the bladder base.  There are stable degenerative changes within the lower lumbar spine.  IMPRESSION:  1.  Nonspecific enlargement of a single left inguinal lymph node. This could reflect a reactive  node, especially if there has been trauma or inflammation in the left lower extremity.  Recurrent lymphoma cannot be excluded. 2.  No other abdominal pelvic lymphadenopathy.  Chronic soft tissue stranding in the base of the mesentery is stable. 3.  Stable mild biliary dilatation status post cholecystectomy.  Original Report Authenticated By: Gerrianne Scale, M.D.   Ct Abdomen Pelvis W Contrast  07/20/2011  *RADIOLOGY REPORT*  Clinical Data:  Non-Hodgkins lymphoma in relapse. Chemotherapy completed 5 months ago.  CT NECK, CHEST, ABDOMEN AND PELVIS WITH CONTRAST  Technique:  Multidetector CT imaging of the neck, chest, abdomen and pelvis was performed using the standard protocol following the bolus administration of intravenous contrast.  Contrast: OMNIPAQUE IOHEXOL 300 MG/ML  SOLN  Comparison:  Prior CTs 10/20/2010 and 03/24/2011.  CT NECK  Findings:  There are postsurgical changes related to interval excision of the left  submandibular gland.  No residual or recurrent mass is identified in the left submandibular space. The right submandibular and both parotid glands appear normal.  There are no enlarged cervical lymph nodes. Small submental lymph nodes are unchanged.  No lesions of the pharyngeal mucosal space are identified.  The visualized paranasal sinuses and orbital contents appear normal.  The thyroid gland appears normal. There is stable cervical spondylosis.  IMPRESSION:  1.  Interval excision of the left parotid gland. 2.  No residual or recurrent submandibular mass identified. 3.  Stable submental lymph nodes.  No pathologically enlarged cervical lymph nodes.  CT CHEST  Findings: Small mediastinal lymph nodes are unchanged.  There are no pathologically enlarged mediastinal, hilar or axillary lymph nodes.  Coronary artery calcifications and a moderate sized hiatal hernia are again noted.  There are stable postsurgical changes status post right lower lobe wedge resection.  The lungs appear stable with mild underlying centrilobular emphysema and scattered scarring.  Small subpleural nodules in the left upper lobe (image 25) and in the left lower lobe (image 43) are unchanged.  There is no pleural or pericardial effusion.  There are no suspicious osseous findings.  Again demonstrated are dilated nerve root sheaths on the right at T10-T11, bilaterally at T11-T12 and on the left at T12-L1, unchanged.  IMPRESSION: Stable postoperative chest CT.  No evidence of recurrent lymphoma.  CT ABDOMEN AND PELVIS  Findings: There is stable mild biliary dilatation status post cholecystectomy.  A cyst in the left hepatic lobe is stable.  The spleen, pancreas and adrenal glands appear normal.  There is a stable 2.0 cm cyst lower pole of the left kidney.  The right kidney appears normal.  There is no hydronephrosis.  There is stable soft tissue stranding in the base of the mesentery around the celiac axis, attributed to treated lymphoma.  No  enlarged mesenteric or retroperitoneal lymph nodes are identified. There is no pelvic adenopathy.  However, there has been definite enlargement of a left inguinal node, measuring 12 mm short axis on image 131.  Scattered vascular calcifications and diverticular changes of the sigmoid colon are stable.  There is stable enlargement of the prostate gland which protrudes into the bladder base.  There are stable degenerative changes within the lower lumbar spine.  IMPRESSION:  1.  Nonspecific enlargement of a single left inguinal lymph node. This could reflect a reactive node, especially if there has been trauma or inflammation in the left lower extremity.  Recurrent lymphoma cannot be excluded. 2.  No other abdominal pelvic lymphadenopathy.  Chronic soft tissue stranding in the base of  the mesentery is stable. 3.  Stable mild biliary dilatation status post cholecystectomy.  Original Report Authenticated By: Gerrianne Scale, M.D.    Impression and Plan: #1 recent second extranodal recurrence of low-grade B-cell non-Hodgkin's lymphoma in the left submandibular gland with previous involvement of an isolated right pulmonary nodule. Both treated with surgery. Adjuvant Rituxan following resection of the lung lesion. Observation alone at present. I reviewed with him and his wife that we do have other treatment options available at signs of any further progression. I will continue to get surveillance CT scans along with  physical exams. Despite the bumps along the way, he is now clinically and radiographically back in remission out over 9 years from initial diagnosis in June 2004.  #2. Chronic polycythemia in a nonsmoker with normal erythropoietin levels. Plan: Observation alone. A trend for progressive rise in his hemoglobin or hematocrit.   CC:. Dr. Osborn Coho; Dr. Meredith Mody Fax [phone]   Levert Feinstein, MD 7/17/201310:46 AM

## 2011-10-18 DIAGNOSIS — H52229 Regular astigmatism, unspecified eye: Secondary | ICD-10-CM | POA: Diagnosis not present

## 2011-10-18 DIAGNOSIS — H5210 Myopia, unspecified eye: Secondary | ICD-10-CM | POA: Diagnosis not present

## 2011-10-18 DIAGNOSIS — H2510 Age-related nuclear cataract, unspecified eye: Secondary | ICD-10-CM | POA: Diagnosis not present

## 2011-10-18 DIAGNOSIS — H35369 Drusen (degenerative) of macula, unspecified eye: Secondary | ICD-10-CM | POA: Diagnosis not present

## 2011-10-18 DIAGNOSIS — H524 Presbyopia: Secondary | ICD-10-CM | POA: Diagnosis not present

## 2011-10-21 DIAGNOSIS — Z23 Encounter for immunization: Secondary | ICD-10-CM | POA: Diagnosis not present

## 2011-10-21 DIAGNOSIS — Z0000 Encounter for general adult medical examination without abnormal findings: Secondary | ICD-10-CM | POA: Diagnosis not present

## 2011-11-22 ENCOUNTER — Other Ambulatory Visit (HOSPITAL_BASED_OUTPATIENT_CLINIC_OR_DEPARTMENT_OTHER): Payer: Medicare Other

## 2011-11-22 ENCOUNTER — Ambulatory Visit (HOSPITAL_COMMUNITY)
Admission: RE | Admit: 2011-11-22 | Discharge: 2011-11-22 | Disposition: A | Discharge status: Home / Self Care | Payer: Medicare Other | Source: Ambulatory Visit | Attending: Oncology | Admitting: Oncology

## 2011-11-22 ENCOUNTER — Other Ambulatory Visit: Payer: Self-pay | Admitting: Oncology

## 2011-11-22 ENCOUNTER — Ambulatory Visit (HOSPITAL_COMMUNITY): Admission: RE | Admit: 2011-11-22 | Payer: Medicare Other | Source: Ambulatory Visit

## 2011-11-22 DIAGNOSIS — K118 Other diseases of salivary glands: Secondary | ICD-10-CM | POA: Diagnosis not present

## 2011-11-22 DIAGNOSIS — C8510 Unspecified B-cell lymphoma, unspecified site: Secondary | ICD-10-CM

## 2011-11-22 DIAGNOSIS — C7800 Secondary malignant neoplasm of unspecified lung: Secondary | ICD-10-CM | POA: Diagnosis not present

## 2011-11-22 DIAGNOSIS — K449 Diaphragmatic hernia without obstruction or gangrene: Secondary | ICD-10-CM | POA: Diagnosis not present

## 2011-11-22 DIAGNOSIS — C8590 Non-Hodgkin lymphoma, unspecified, unspecified site: Secondary | ICD-10-CM

## 2011-11-22 DIAGNOSIS — Z9221 Personal history of antineoplastic chemotherapy: Secondary | ICD-10-CM | POA: Insufficient documentation

## 2011-11-22 DIAGNOSIS — C8589 Other specified types of non-Hodgkin lymphoma, extranodal and solid organ sites: Secondary | ICD-10-CM | POA: Diagnosis not present

## 2011-11-22 LAB — COMPREHENSIVE METABOLIC PANEL (CC13)
ALT: 29 U/L (ref 0–55)
AST: 21 U/L (ref 5–34)
Albumin: 4.1 g/dL (ref 3.5–5.0)
Calcium: 9.8 mg/dL (ref 8.4–10.4)
Chloride: 105 mEq/L (ref 98–107)
Potassium: 4.5 mEq/L (ref 3.5–5.1)
Sodium: 139 mEq/L (ref 136–145)

## 2011-11-22 LAB — CBC WITH DIFFERENTIAL/PLATELET
BASO%: 0.6 % (ref 0.0–2.0)
HCT: 49.9 % (ref 38.4–49.9)
MCHC: 34.7 g/dL (ref 32.0–36.0)
MONO#: 0.8 10*3/uL (ref 0.1–0.9)
RBC: 5.72 10*6/uL (ref 4.20–5.82)
WBC: 8 10*3/uL (ref 4.0–10.3)
lymph#: 1.8 10*3/uL (ref 0.9–3.3)

## 2011-11-22 LAB — SEDIMENTATION RATE: Sed Rate: 1 mm/hr (ref 0–16)

## 2011-11-22 IMAGING — CT CT ABD-PELV W/ CM
4 of 9 series · 10 of 33 positions shown, 11 images · IV contrast (OMNIPAQUE)
Comparison: CT [DATE]

CT NECK

CLINICAL DATA: Lymphoma diagnosed [6R] with lung metastasis
diagnosed in [6R].  Left submandibular node enlarged enlarged [6R].
Chemotherapy complete.  Some right growing pain.

CT NECK, CHEST, ABDOMEN AND PELVIS WITH CONTRAST
TECHNIQUE: Multidetector CT imaging of the neck, chest, abdomen
and pelvis was performed using the standard protocol following the
bolus administration of intravenous contrast.
Contrast: 100mL OMNIPAQUE IOHEXOL 300 MG/ML  SOLN

[Series 2: cap st · axial · 0.82mm/px · z∈[-594,-374]mm · 2 of 132 slices shown, 3 images]
[im 44/132  soft-tissue]
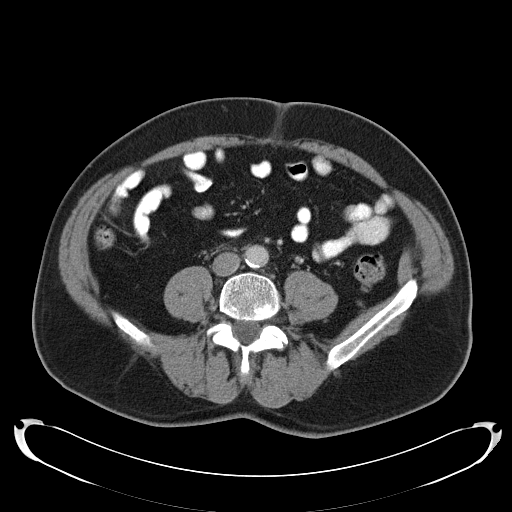
[im 44/132  bone]
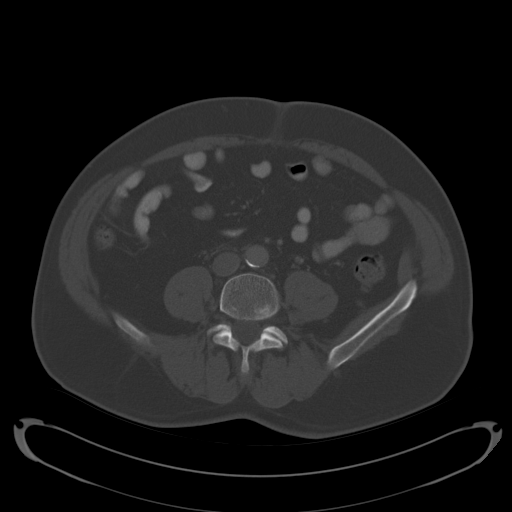
[im 88/132  bone]
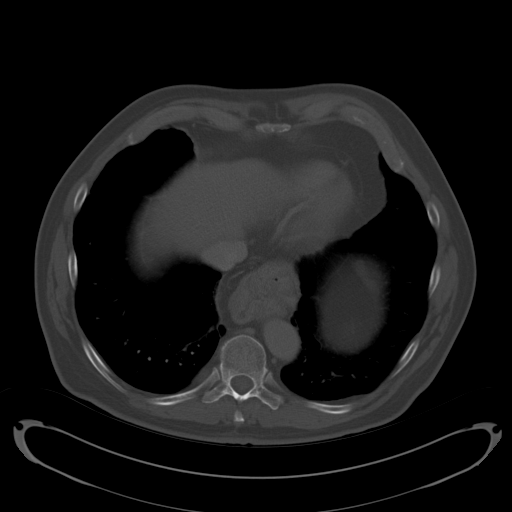

[Series 603: <mpr thick range(1)> · sagittal · 1.29mm/px · 5 of 127 slices shown]
[im 22/127  bone]
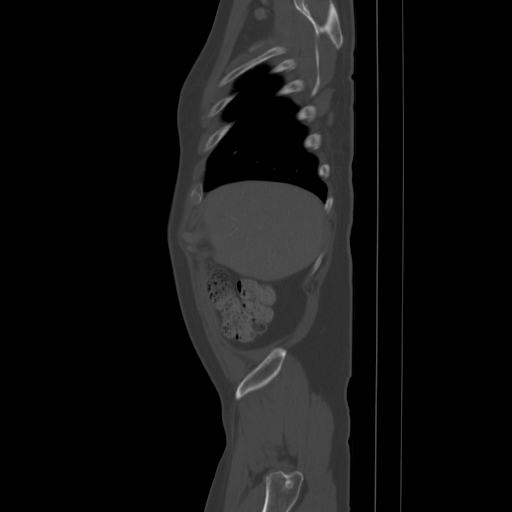
[im 43/127  bone]
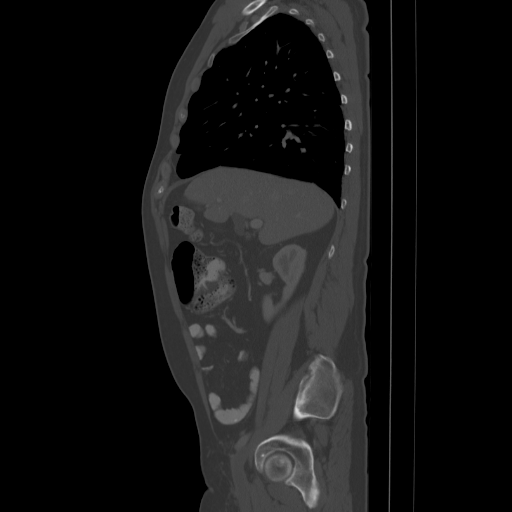
[im 64/127  bone]
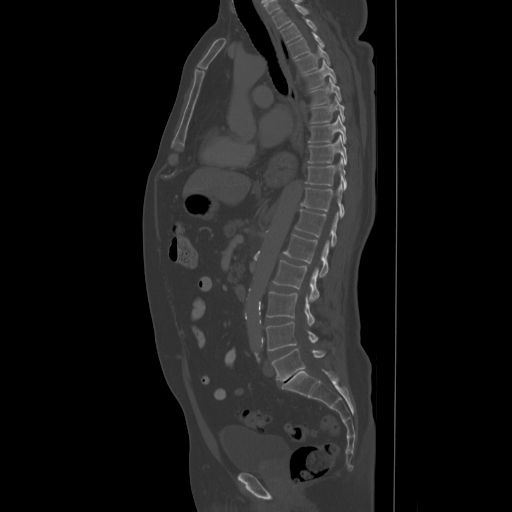
[im 85/127  bone]
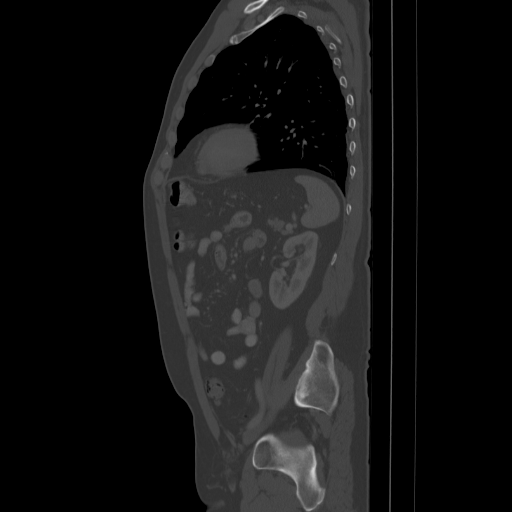
[im 106/127  bone]
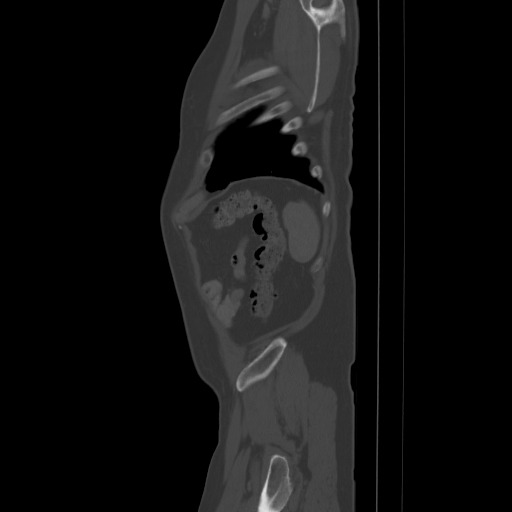

[Series 604: <mpr thick range(2)> · axial · 0.53mm/px · z∈[-171,-75]mm · 2 of 148 slices shown]
[im 50/148  bone]
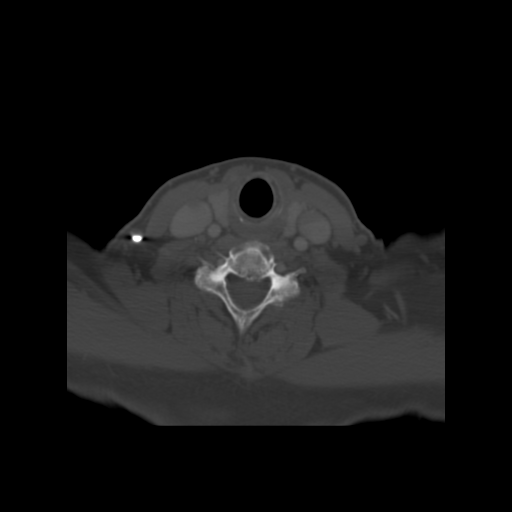
[im 99/148  bone]
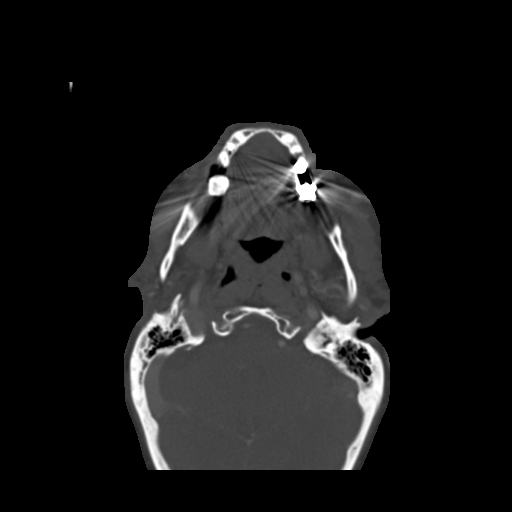

[Series 605: <mpr thick range(3)> · coronal · 0.53mm/px · 1 of 114 slices shown]
[im 57/114  bone]
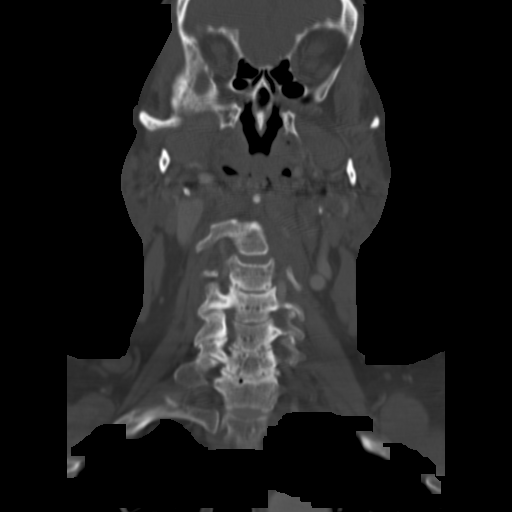

[10 of 33 positions shown; findings below may reference images not displayed]

FINDINGS: There is no pathologic enlarged level cervical lymph
nodes.  No enlarged submandibular lymph nodes.  The left
submandibular gland is removed.  The pharyngeal mucosa is symmetric
without evidence of mass.

Limited view of the orbits and inferior brain are normal.  No
adenopathy in the posterior triangles.

Review of the bone windows demonstrates no aggressive osseous
lesions.
IMPRESSION: No evidence of lymphoma recurrence within the neck.

CT CHEST
FINDINGS: No axillary or supraclavicular lymphadenopathy.  No
mediastinal or hilar lymphadenopathy.  No pericardial fluid.
Coronary calcifications are present.  Moderate size hiatal hernia.
The review of the lung windows demonstrates postsurgical change in
the right lower lobe without evidence of nodularity or mass.  There
is no new or suspicious pulmonary nodules.

Rounded paraspinal fluid density lesions at the level of the lower
thoracic vertebral bodies consistent with dilated nerve sheath
roots.
IMPRESSION: 1.  No evidence of lymphoma recurrence within the thorax.

CT ABDOMEN AND PELVIS
FINDINGS: Low density lesion left hepatic lobe most consistent with
benign cyst.   Post cholecystectomy.  Mild intrahepatic duct
dilatation in the left hepatic lobe.  The common bile duct is
mildly dilated similar to prior.  This likely relates to prior
cholecystectomy.  The pancreas and spleen are normal.  Adrenal
glands and kidneys are normal.  The stomach, small bowel, and colon
normal.  There are multiple diverticula the sigmoid colon.

Abdominal aorta is normal caliber.  There is a haziness to the
mesentery adjacent to the SMA and celiac trunk.  This likely
relates to prior site of lymphoma and subsequent therapy.  No
retroperitoneal periportal adenopathy.  No mesenteric adenopathy.
Small retrocrural lymph node measuring 6 mm compares to 5 mm on
prior.

In the pelvis, the prostate gland bladder normal.  No pelvic
lymphadenopathy. Review of  bone windows demonstrates no aggressive
osseous lesions.
IMPRESSION: 1..  No evidence of lymphoma recurrence of the abdomen or  pelvis.
2.  Mild haziness within  root the mesentery likely related to
prior therapy.
3.  Small retrocrural lymph node is slightly more prominent.
Recommend attention on follow-up.
4.  Moderate hiatal hernia.

## 2011-11-22 MED ORDER — IOHEXOL 300 MG/ML  SOLN
100.0000 mL | Freq: Once | INTRAMUSCULAR | Status: AC | PRN
  Administered 2011-11-22: 100 mL via INTRAVENOUS

## 2011-11-23 ENCOUNTER — Telehealth: Payer: Self-pay | Admitting: Medical Oncology

## 2011-11-23 NOTE — Telephone Encounter (Signed)
Message copied by Charma Igo on Tue Nov 23, 2011  4:59 PM ------      Message from: [place], [place] E      Created: Tue Nov 23, 2011  4:53 PM                   ----- Message -----         From: Alphonzo Cruise, RN         Sent: 11/23/2011   2:54 PM           To: Amy Lillia Mountain, RN                        ----- Message -----         From: Levert Feinstein, MD         Sent: 11/23/2011   7:14 AM           To: Orbie Hurst, RN, Sabino Snipes, RN, #            Call pt CT negative for recurrence of lymphoma

## 2011-11-23 NOTE — Telephone Encounter (Signed)
Pt.notified

## 2011-11-29 ENCOUNTER — Telehealth: Payer: Self-pay | Admitting: Oncology

## 2011-11-29 ENCOUNTER — Ambulatory Visit (HOSPITAL_BASED_OUTPATIENT_CLINIC_OR_DEPARTMENT_OTHER): Payer: Medicare Other | Admitting: Nurse Practitioner

## 2011-11-29 VITALS — BP 119/82 | HR 87 | Temp 97.2°F | Resp 18 | Ht 72.0 in | Wt 206.3 lb

## 2011-11-29 DIAGNOSIS — D45 Polycythemia vera: Secondary | ICD-10-CM

## 2011-11-29 DIAGNOSIS — C8581 Other specified types of non-Hodgkin lymphoma, lymph nodes of head, face, and neck: Secondary | ICD-10-CM | POA: Diagnosis not present

## 2011-11-29 DIAGNOSIS — C8590 Non-Hodgkin lymphoma, unspecified, unspecified site: Secondary | ICD-10-CM

## 2011-11-29 NOTE — Telephone Encounter (Signed)
[facility] pt appt schedule for march 2014. Pt aware [facility] will contact him re scans to be done approx [phone].

## 2011-11-29 NOTE — Progress Notes (Signed)
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 He presented at that time with inguinal adenopathy. There was progressive enlargement of lymph nodes with repeat biopsy November 2005 showing conversion to a high-grade lymphoma. He was treated with CHOP/Rituxan chemotherapy with complete remission. He developed a pulmonary parenchymal lesion in November 2008. This was treated with surgical resection on 12/19/2006. The histology was low grade. He was treated with adjuvant Rituxan for 2 years between December 2008 and August 2010. In March of this year he was seen for evaluation of an enlarged left submandibular gland. CT scans of the neck, chest, abdomen and pelvis showed an isolated lesion within the left submandibular gland. He underwent excision of the gland on 04/30/2011 with pathology consistent with an isolated focus of low-grade NHL. No specific treatment was recommended since the remainder of the restaging evaluation was unremarkable and the only site of obvious disease was surgically removed.  He feels well. No interim illness/infection. No enlarged lymph nodes. No fever or sweats. Good appetite. Weight stable. No shortness of breath or cough. No headaches or vision change. About one month ago he "strained" a muscle at the upper right thigh. The pain is improving.    Objective: Blood pressure 119/82, pulse 87, temperature 97.2 F (36.2 C), temperature source Oral, resp. rate 18, height 6' (1.829 m), weight 206 lb 4.8 oz (93.577 kg).  Oropharynx is without thrush or ulceration. No palpable cervical, supraclavicular, axillary or inguinal lymph nodes. Healed scar left submandibular region. Lungs are clear. No wheezes or rales. Regular cardiac rhythm. Abdomen is soft and nontender. No organomegaly. Extremities are without edema. Calves are soft and nontender. Motor strength 5/5. Knee DTR's 2+,  symmetric.   Lab Results: Lab Results  Component Value Date   WBC 8.0 11/22/2011   HGB 17.3* 11/22/2011   HCT 49.9 11/22/2011   MCV 87.2 11/22/2011   PLT 204 11/22/2011    Chemistry:    Chemistry      Component Value Date/Time   NA 139 11/22/2011 0940   NA 139 07/20/2011 0800   NA 141 04/26/2011 0941   K 4.5 11/22/2011 0940   K 4.4 07/20/2011 0800   K 4.7 04/26/2011 0941   CL 105 11/22/2011 0940   CL 100 07/20/2011 0800   CL 106 04/26/2011 0941   CO2 28 11/22/2011 0940   CO2 29 07/20/2011 0800   CO2 26 04/26/2011 0941   BUN 21.0 11/22/2011 0940   BUN 14 07/20/2011 0800   BUN 15 04/26/2011 0941   CREATININE 1.1 11/22/2011 0940   CREATININE 1.2 07/20/2011 0800   CREATININE 1.00 04/26/2011 0941      Component Value Date/Time   CALCIUM 9.8 11/22/2011 0940   CALCIUM 8.7 07/20/2011 0800   CALCIUM 9.3 04/26/2011 0941   ALKPHOS 63 11/22/2011 0940   ALKPHOS 55 07/20/2011 0800   ALKPHOS 59 03/16/2011 1522   AST 21 11/22/2011 0940   AST 25 07/20/2011 0800   AST 23 03/16/2011 1522   ALT 29 11/22/2011 0940   ALT 31 03/16/2011 1522   BILITOT 0.69 11/22/2011 0940   BILITOT 1.00 07/20/2011 0800   BILITOT 0.4 03/16/2011 1522       Studies/Results: Ct Soft Tissue Neck W Contrast  11/22/2011  *RADIOLOGY REPORT*  Clinical Data:  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  Left submandibular node enlarged enlarged 2013. Chemotherapy complete.  Some right  growing pain.  CT NECK, CHEST, ABDOMEN AND PELVIS WITH CONTRAST  Technique:  Multidetector CT imaging of the neck, chest, abdomen and pelvis was performed using the standard protocol following the bolus administration of intravenous contrast.  Contrast: OMNIPAQUE IOHEXOL 300 MG/ML  SOLN  Comparison:  CT 07/20/2011  CT NECK  Findings:  There is no pathologic enlarged level cervical lymph nodes.  No enlarged submandibular lymph nodes.  The left submandibular gland is removed.  The pharyngeal mucosa is symmetric without evidence of mass.   Limited view of the orbits and inferior brain are normal.  No adenopathy in the posterior triangles.  Review of the bone windows demonstrates no aggressive osseous lesions.  IMPRESSION: No evidence of lymphoma recurrence within the neck.  CT CHEST  Findings: No axillary or supraclavicular lymphadenopathy.  No mediastinal or hilar lymphadenopathy.  No pericardial fluid. Coronary calcifications are present.  Moderate size hiatal hernia. The review of the lung windows demonstrates postsurgical change in the right lower lobe without evidence of nodularity or mass.  There is no new or suspicious pulmonary nodules.  Rounded paraspinal fluid density lesions at the level of the lower thoracic vertebral bodies consistent with dilated nerve sheath roots.  IMPRESSION: 1.  No evidence of lymphoma recurrence within the thorax.  CT ABDOMEN AND PELVIS  Findings: Low density lesion left hepatic lobe most consistent with benign cyst.   Post cholecystectomy.  Mild intrahepatic duct dilatation in the left hepatic lobe.  The common bile duct is mildly dilated similar to prior.  This likely relates to prior cholecystectomy.  The pancreas and spleen are normal.  Adrenal glands and kidneys are normal.  The stomach, small bowel, and colon normal.  There are multiple diverticula the sigmoid colon.  Abdominal aorta is normal caliber.  There is a haziness to the mesentery adjacent to the SMA and celiac trunk.  This likely relates to prior site of lymphoma and subsequent therapy.  No retroperitoneal periportal adenopathy.  No mesenteric adenopathy. Small retrocrural lymph node measuring 6 mm compares to 5 mm on prior.  In the pelvis, the prostate gland bladder normal.  No pelvic lymphadenopathy. Review of  bone windows demonstrates no aggressive osseous lesions.  IMPRESSION:  1.  No evidence of lymphoma recurrence of the abdomen or  pelvis. 2.  Mild haziness within  root the mesentery likely related to prior therapy. 3.  Small retrocrural  lymph node is slightly more prominent. Recommend attention on follow-up. 4.  Moderate hiatal hernia.   Original Report Authenticated By: Genevive Bi, M.D.    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  Left submandibular node enlarged enlarged 2013. Chemotherapy complete.  Some right growing pain.  CT NECK, CHEST, ABDOMEN AND PELVIS WITH CONTRAST  Technique:  Multidetector CT imaging of the neck, chest, abdomen and pelvis was performed using the standard protocol following the bolus administration of intravenous contrast.  Contrast: OMNIPAQUE IOHEXOL 300 MG/ML  SOLN  Comparison:  CT 07/20/2011  CT NECK  Findings:  There is no pathologic enlarged level cervical lymph nodes.  No enlarged submandibular lymph nodes.  The left submandibular gland is removed.  The pharyngeal mucosa is symmetric without evidence of mass.  Limited view of the orbits and inferior brain are normal.  No adenopathy in the posterior triangles.  Review of the bone windows demonstrates no aggressive osseous lesions.  IMPRESSION: No evidence of lymphoma recurrence within  the neck.  CT CHEST  Findings: No axillary or supraclavicular lymphadenopathy.  No mediastinal or hilar lymphadenopathy.  No pericardial fluid. Coronary calcifications are present.  Moderate size hiatal hernia. The review of the lung windows demonstrates postsurgical change in the right lower lobe without evidence of nodularity or mass.  There is no new or suspicious pulmonary nodules.  Rounded paraspinal fluid density lesions at the level of the lower thoracic vertebral bodies consistent with dilated nerve sheath roots.  IMPRESSION: 1.  No evidence of lymphoma recurrence within the thorax.  CT ABDOMEN AND PELVIS  Findings: Low density lesion left hepatic lobe most consistent with benign cyst.   Post cholecystectomy.  Mild intrahepatic duct dilatation in the left hepatic lobe.   The common bile duct is mildly dilated similar to prior.  This likely relates to prior cholecystectomy.  The pancreas and spleen are normal.  Adrenal glands and kidneys are normal.  The stomach, small bowel, and colon normal.  There are multiple diverticula the sigmoid colon.  Abdominal aorta is normal caliber.  There is a haziness to the mesentery adjacent to the SMA and celiac trunk.  This likely relates to prior site of lymphoma and subsequent therapy.  No retroperitoneal periportal adenopathy.  No mesenteric adenopathy. Small retrocrural lymph node measuring 6 mm compares to 5 mm on prior.  In the pelvis, the prostate gland bladder normal.  No pelvic lymphadenopathy. Review of  bone windows demonstrates no aggressive osseous lesions.  IMPRESSION:  1.  No evidence of lymphoma recurrence of the abdomen or  pelvis. 2.  Mild haziness within  root the mesentery likely related to prior therapy. 3.  Small retrocrural lymph node is slightly more prominent. Recommend attention on follow-up. 4.  Moderate hiatal hernia.   Original Report Authenticated By: Genevive Bi, M.D.    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  Left submandibular node enlarged enlarged 2013. Chemotherapy complete.  Some right growing pain.  CT NECK, CHEST, ABDOMEN AND PELVIS WITH CONTRAST  Technique:  Multidetector CT imaging of the neck, chest, abdomen and pelvis was performed using the standard protocol following the bolus administration of intravenous contrast.  Contrast: OMNIPAQUE IOHEXOL 300 MG/ML  SOLN  Comparison:  CT 07/20/2011  CT NECK  Findings:  There is no pathologic enlarged level cervical lymph nodes.  No enlarged submandibular lymph nodes.  The left submandibular gland is removed.  The pharyngeal mucosa is symmetric without evidence of mass.  Limited view of the orbits and inferior brain are normal.  No adenopathy in the  posterior triangles.  Review of the bone windows demonstrates no aggressive osseous lesions.  IMPRESSION: No evidence of lymphoma recurrence within the neck.  CT CHEST  Findings: No axillary or supraclavicular lymphadenopathy.  No mediastinal or hilar lymphadenopathy.  No pericardial fluid. Coronary calcifications are present.  Moderate size hiatal hernia. The review of the lung windows demonstrates postsurgical change in the right lower lobe without evidence of nodularity or mass.  There is no new or suspicious pulmonary nodules.  Rounded paraspinal fluid density lesions at the level of the lower thoracic vertebral bodies consistent with dilated nerve sheath roots.  IMPRESSION: 1.  No evidence of lymphoma recurrence within the thorax.  CT ABDOMEN AND PELVIS  Findings: Low density lesion left hepatic lobe most consistent with benign cyst.   Post cholecystectomy.  Mild intrahepatic duct dilatation in the left hepatic lobe.  The common bile duct is mildly dilated similar to prior.  This likely relates to prior cholecystectomy.  The pancreas and spleen are normal.  Adrenal glands and kidneys are normal.  The stomach, small bowel, and colon normal.  There are multiple diverticula the sigmoid colon.  Abdominal aorta is normal caliber.  There is a haziness to the mesentery adjacent to the SMA and celiac trunk.  This likely relates to prior site of lymphoma and subsequent therapy.  No retroperitoneal periportal adenopathy.  No mesenteric adenopathy. Small retrocrural lymph node measuring 6 mm compares to 5 mm on prior.  In the pelvis, the prostate gland bladder normal.  No pelvic lymphadenopathy. Review of  bone windows demonstrates no aggressive osseous lesions.  IMPRESSION:  1.  No evidence of lymphoma recurrence of the abdomen or  pelvis. 2.  Mild haziness within  root the mesentery likely related to prior therapy. 3.  Small retrocrural lymph node is slightly more prominent. Recommend attention on follow-up. 4.   Moderate hiatal hernia.   Original Report Authenticated By: Genevive Bi, M.D.     Medications: I have reviewed the patient's current medications.  Assessment/Plan:  1. Initial low-grade B-cell non-Hodgkin's lymphoma presenting with inguinal adenopathy in June 2004. Progressive enlargement of lymph nodes with repeat biopsy November 2005 showing conversion to a high grade lymphoma. He was treated with CHOP/Rituxan chemotherapy with complete remission. He developed a pulmonary parenchymal lesion in November 2008 and underwent surgical resection on 12/19/2006. 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 He completed Rituxan for 2 years between 12/2006 and 08/2008.  Recurrence in left submandibular lymph node March 2013 status post surgical excision. Restaging CT scans 11/23/2011 negative for recurrence of lymphoma. 2. Chronic polycythemia with normal erythropoietin level and low ESR. Nonsmoker. Stable.   Disposition-Anthony Kelly appears stable. We will continue to follow on an observation approach with clinic visits and routine CT scans. He will return for a follow-up visit in 4 months with CT scans several days prior. He will contact the office in the interim with any problems.  Lonna Cobb ANP/GNP-BC

## 2011-12-14 DIAGNOSIS — M7710 Lateral epicondylitis, unspecified elbow: Secondary | ICD-10-CM | POA: Diagnosis not present

## 2012-01-11 DIAGNOSIS — M7710 Lateral epicondylitis, unspecified elbow: Secondary | ICD-10-CM | POA: Diagnosis not present

## 2012-01-11 DIAGNOSIS — M25539 Pain in unspecified wrist: Secondary | ICD-10-CM | POA: Diagnosis not present

## 2012-02-26 ENCOUNTER — Other Ambulatory Visit: Payer: Self-pay

## 2012-03-24 ENCOUNTER — Ambulatory Visit (HOSPITAL_COMMUNITY)
Admission: RE | Admit: 2012-03-24 | Discharge: 2012-03-24 | Disposition: A | Discharge status: Home / Self Care | Payer: Medicare Other | Source: Ambulatory Visit | Attending: Nurse Practitioner | Admitting: Nurse Practitioner

## 2012-03-24 ENCOUNTER — Encounter (HOSPITAL_COMMUNITY): Payer: Self-pay

## 2012-03-24 ENCOUNTER — Other Ambulatory Visit (HOSPITAL_BASED_OUTPATIENT_CLINIC_OR_DEPARTMENT_OTHER): Payer: Medicare Other | Admitting: Lab

## 2012-03-24 DIAGNOSIS — N281 Cyst of kidney, acquired: Secondary | ICD-10-CM | POA: Diagnosis not present

## 2012-03-24 DIAGNOSIS — J438 Other emphysema: Secondary | ICD-10-CM | POA: Insufficient documentation

## 2012-03-24 DIAGNOSIS — C8590 Non-Hodgkin lymphoma, unspecified, unspecified site: Secondary | ICD-10-CM

## 2012-03-24 DIAGNOSIS — N400 Enlarged prostate without lower urinary tract symptoms: Secondary | ICD-10-CM | POA: Diagnosis not present

## 2012-03-24 DIAGNOSIS — K449 Diaphragmatic hernia without obstruction or gangrene: Secondary | ICD-10-CM | POA: Diagnosis not present

## 2012-03-24 DIAGNOSIS — M5030 Other cervical disc degeneration, unspecified cervical region: Secondary | ICD-10-CM | POA: Diagnosis not present

## 2012-03-24 DIAGNOSIS — C8581 Other specified types of non-Hodgkin lymphoma, lymph nodes of head, face, and neck: Secondary | ICD-10-CM | POA: Diagnosis not present

## 2012-03-24 DIAGNOSIS — C8589 Other specified types of non-Hodgkin lymphoma, extranodal and solid organ sites: Secondary | ICD-10-CM | POA: Insufficient documentation

## 2012-03-24 DIAGNOSIS — C8583 Other specified types of non-Hodgkin lymphoma, intra-abdominal lymph nodes: Secondary | ICD-10-CM | POA: Diagnosis not present

## 2012-03-24 DIAGNOSIS — Z9089 Acquired absence of other organs: Secondary | ICD-10-CM | POA: Insufficient documentation

## 2012-03-24 DIAGNOSIS — M47812 Spondylosis without myelopathy or radiculopathy, cervical region: Secondary | ICD-10-CM | POA: Diagnosis not present

## 2012-03-24 DIAGNOSIS — C8582 Other specified types of non-Hodgkin lymphoma, intrathoracic lymph nodes: Secondary | ICD-10-CM | POA: Diagnosis not present

## 2012-03-24 LAB — COMPREHENSIVE METABOLIC PANEL (CC13)
AST: 20 U/L (ref 5–34)
Albumin: 3.8 g/dL (ref 3.5–5.0)
Alkaline Phosphatase: 63 U/L (ref 40–150)
BUN: 16.7 mg/dL (ref 7.0–26.0)
Potassium: 4.3 mEq/L (ref 3.5–5.1)
Total Bilirubin: 0.66 mg/dL (ref 0.20–1.20)

## 2012-03-24 LAB — CBC WITH DIFFERENTIAL/PLATELET
Basophils Absolute: 0.1 10*3/uL (ref 0.0–0.1)
EOS%: 1.4 % (ref 0.0–7.0)
HGB: 15.9 g/dL (ref 13.0–17.1)
MCH: 29.5 pg (ref 27.2–33.4)
MCV: 88.2 fL (ref 79.3–98.0)
MONO%: 10.1 % (ref 0.0–14.0)
RBC: 5.4 10*6/uL (ref 4.20–5.82)
RDW: 12.8 % (ref 11.0–14.6)

## 2012-03-24 IMAGING — CT CT ABD-PELV W/ CM
4 of 9 series · 10 of 33 positions shown, 11 images · IV contrast (OMNIPAQUE)
Comparison: [DATE]

CT CHEST

CLINICAL DATA: Restaging lymphoma

CT CHEST, ABDOMEN AND PELVIS WITH CONTRAST
TECHNIQUE: Multidetector CT imaging of the chest, abdomen and
pelvis was performed following the standard protocol during bolus
administration of intravenous contrast.
Contrast: 50mL OMNIPAQUE IOHEXOL 300 MG/ML  SOLN, 125mL OMNIPAQUE
IOHEXOL 300 MG/ML  SOLN

[Series 2: cap st · axial · 0.87mm/px · z∈[-604,-384]mm · 2 of 133 slices shown, 3 images]
[im 45/133  soft-tissue]
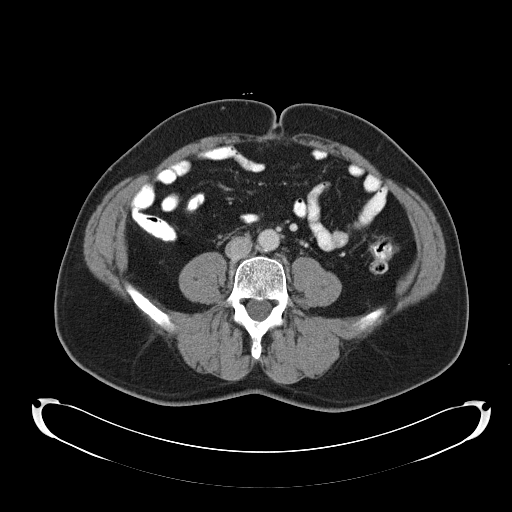
[im 45/133  bone]
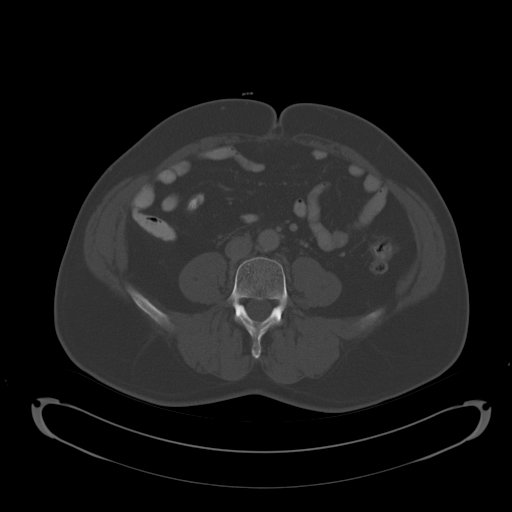
[im 89/133  bone]
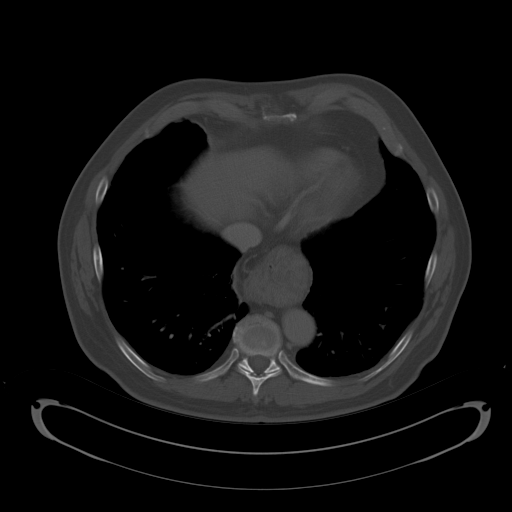

[Series 603: <mpr thick range(1)> · sagittal · 1.29mm/px · 5 of 122 slices shown]
[im 21/122  bone]
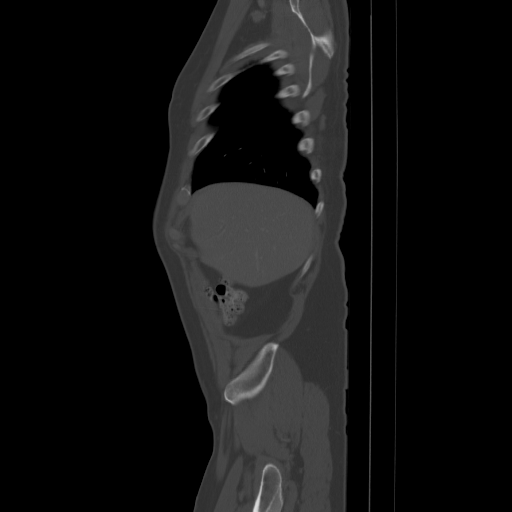
[im 41/122  bone]
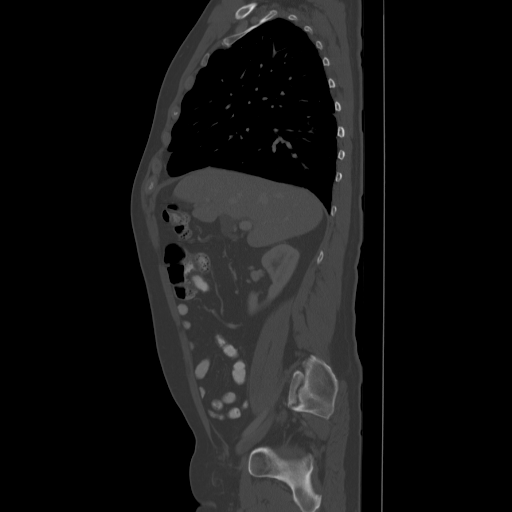
[im 61/122  bone]
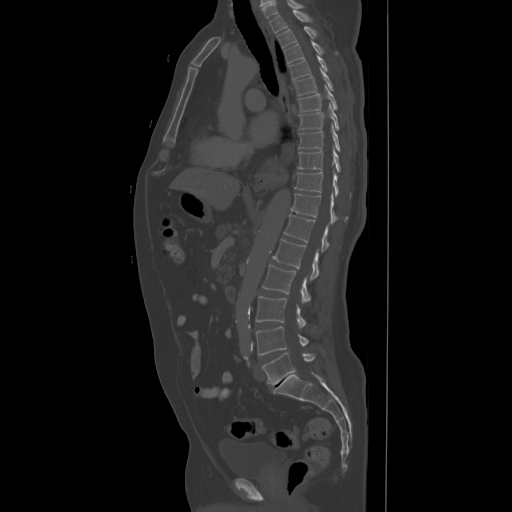
[im 81/122  bone]
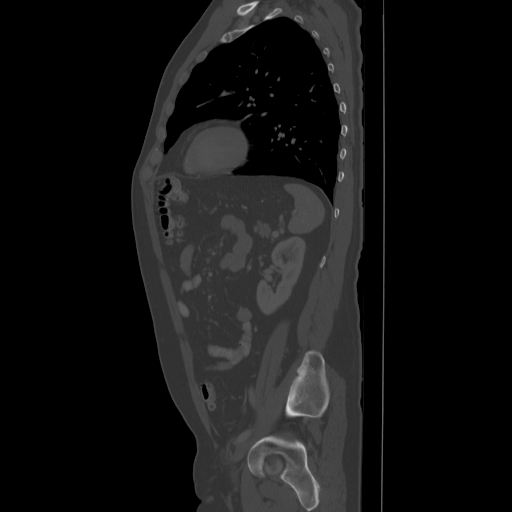
[im 101/122  bone]
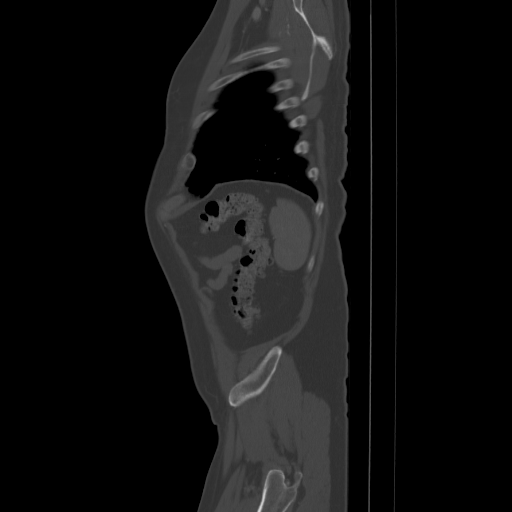

[Series 604: <mpr thick range(2)> · coronal · 0.46mm/px · 1 of 94 slices shown]
[im 47/94  bone]
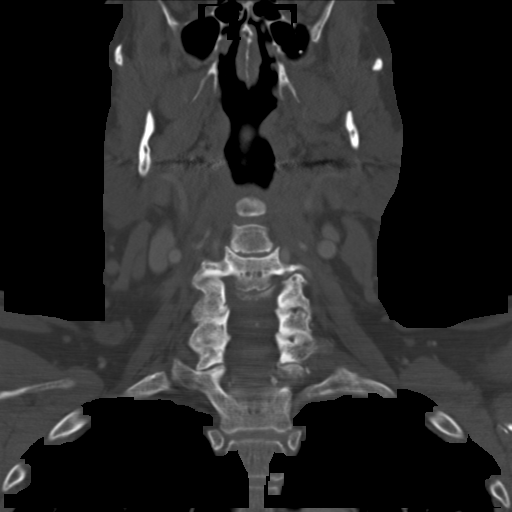

[Series 605: <mpr thick range(3)> · axial · 0.46mm/px · z∈[-182,-96]mm · 2 of 129 slices shown]
[im 43/129  bone]
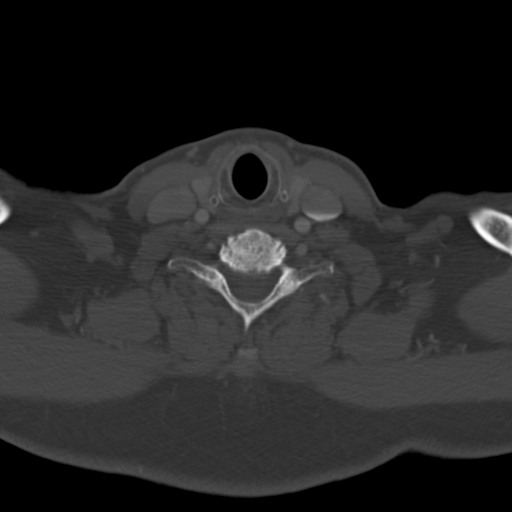
[im 86/129  bone]
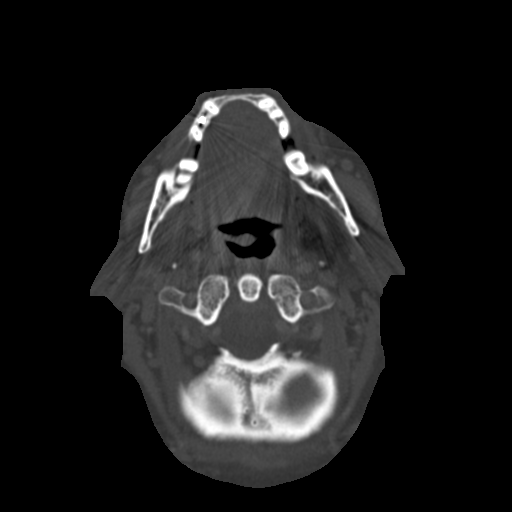

[10 of 33 positions shown; findings below may reference images not displayed]

FINDINGS: Lungs/pleura: No pleural effusion identified.  Mild
changes of emphysema.  Postop changes are identified within the
posterior right lower lobe.  No airspace consolidation.  No
suspicious nodule or mass.

Heart/Mediastinum: The heart size is normal.  No pericardial
effusion.  No enlarged mediastinal or hilar adenopathy.

Bones/Musculoskeletal:  Review of the visualized osseous structures
is unremarkable.  No worrisome lytic or sclerotic bone lesions.
IMPRESSION: 1.  Stable CT of the chest.
2.  No mass or adenopathy.

CT ABDOMEN AND PELVIS
FINDINGS: Low attenuation structure within the left hepatic lobe
is unchanged measuring 9 mm.  There is a new small low attenuation
structure within the right hepatic lobe measuring 5 mm.  This is
too small to characterize.  Previous cholecystectomy.  Chronic
increased caliber of the common bile duct is unchanged from
previous exam measuring up to 1.4 cm.  The pancreas is
unremarkable.  The spleen is normal.

Normal appearance of the adrenal glands.  The right kidney is
normal.  There is a cyst within the inferior pole of the left
kidney which measures 1.5 cm.  Urinary bladder is unremarkable.
There is prostate gland enlargement.

Calcified atherosclerotic disease affects the abdominal aorta.
There is no aneurysm.  Soft tissue haziness is within the upper
abdomen is again noted surrounding the celiac and base of the
superior mesenteric artery.  This is unchanged when compared with
previous exam.

The small retrocrural lymph node is stable measuring 6 mm, image
59/series 2.  Just below the aortic bifurcation there is a right
common iliac lymph node measuring 8 mm, image 98/series 2.  This is
new from previous exam.  The right external iliac lymph node is new
from prior exam and measures 9 mm, image 109/series 2.  More
distally there is an external iliac node measuring 9 mm, image
113/series 2.  Previously this measured 3 mm.

Moderate size hiatal hernia.  The stomach and small bowel loops are
otherwise unremarkable.  Normal appearance of the proximal colon.
There is multiple distal colonic diverticula without acute
inflammation.

Review of the visualized osseous structures is significant for
lumbar spondylosis.
IMPRESSION: 1.  Previously referenced sub centimeter retrocrural lymph node is
stable measuring 6 mm.
2.  There are sub centimeter right common and right external iliac
lymph nodes which have increased in size from previous exam.
Although these remain "nonpathologically" enlarged the change in
size warrants close interval follow-up.
3.  Prior cholecystectomy with chronic increased caliber of the
CBD.  No obstructing stone or mass noted.

## 2012-03-24 MED ORDER — IOHEXOL 300 MG/ML  SOLN
125.0000 mL | Freq: Once | INTRAMUSCULAR | Status: AC | PRN
  Administered 2012-03-24: 125 mL via INTRAVENOUS

## 2012-03-24 MED ORDER — IOHEXOL 300 MG/ML  SOLN
50.0000 mL | Freq: Once | INTRAMUSCULAR | Status: AC | PRN
  Administered 2012-03-24: 50 mL via ORAL

## 2012-03-27 ENCOUNTER — Telehealth: Payer: Self-pay | Admitting: Oncology

## 2012-03-27 ENCOUNTER — Ambulatory Visit (HOSPITAL_BASED_OUTPATIENT_CLINIC_OR_DEPARTMENT_OTHER): Payer: Medicare Other | Admitting: Oncology

## 2012-03-27 VITALS — BP 126/87 | HR 80 | Temp 97.5°F | Resp 18 | Ht 72.0 in | Wt 204.5 lb

## 2012-03-27 DIAGNOSIS — C8590 Non-Hodgkin lymphoma, unspecified, unspecified site: Secondary | ICD-10-CM

## 2012-03-27 DIAGNOSIS — C8510 Unspecified B-cell lymphoma, unspecified site: Secondary | ICD-10-CM

## 2012-03-27 DIAGNOSIS — C8589 Other specified types of non-Hodgkin lymphoma, extranodal and solid organ sites: Secondary | ICD-10-CM | POA: Diagnosis not present

## 2012-03-27 DIAGNOSIS — D45 Polycythemia vera: Secondary | ICD-10-CM | POA: Diagnosis not present

## 2012-03-27 DIAGNOSIS — E782 Mixed hyperlipidemia: Secondary | ICD-10-CM | POA: Diagnosis not present

## 2012-03-27 DIAGNOSIS — I471 Supraventricular tachycardia: Secondary | ICD-10-CM | POA: Diagnosis not present

## 2012-03-27 DIAGNOSIS — Z79899 Other long term (current) drug therapy: Secondary | ICD-10-CM | POA: Diagnosis not present

## 2012-03-27 NOTE — Patient Instructions (Signed)
We will repeat lab and scans on July 10 Visit with Dr Reece Agar 7/14

## 2012-03-27 NOTE — Progress Notes (Signed)
Hematology and Oncology Follow Up Visit  Anthony Kelly [id number] 30-Jun-1945 67 y.o. 03/27/2012 1:49 PM   Principle Diagnosis: Encounter Diagnoses  Name Primary?  . Low grade B-cell lymphoma Yes  . Malignant lymphoma, high grade      Interim History:   Followup visit for this pleasant 67 year old man who initially presented with left inguinal lymphadenopathy in June 2004 with biopsy showing low grade non-Hodgkin's lymphoma. He was treated with observation alone until November 2005 when the lymph node mass began to grow and repeat biopsy showed conversion to high-grade lymphoma. He achieved a complete remission with CHOP Rituxan. He developed a single right pulmonary lesion in November 2008 found on a routine followup scan. This was surgically resected 12/19/2006 and showed low-grade non-Hodgkin's lymphoma. He was treated with Rituxan intermittently for 2 years between December 2008 in August 2010. At time of a routine followup visit in March of 2013 he pointed out an enlarged left submandibular gland which he had noted for about one month. CT scans of the neck chest abdomen and pelvis showed an isolated lesion within the left submandibular gland. He was referred to ear nose and throat surgery and underwent excision of the gland on 04/30/2011 by Dr. Osborn Coho. Findings were consistent with an isolated focus of low-grade non-Hodgkin's lymphoma. Since the remainder of his restaging evaluation was unremarkable and the only site of obvious disease was surgically removed, I did not feel that we needed to put him back on any specific treatment.  Clinically he is doing well and has had no interim medical problems. He has not noted any new areas of lymphadenopathy.  Scans done in anticipation of today's visit March 14. I reviewed these studies. Radiologist mentions a few small subcentimeter lymph nodes that have grown in the interval but still remain nonpathologic in size. I am not impressed with any  significant change in these nodes and we'll continue to monitor the patient closely.    Medications: reviewed  Allergies: No Known Allergies  Review of Systems: Constitutional:   No constitutional symptoms Respiratory: No cough or dyspnea Cardiovascular:  No chest pain or palpitations Gastrointestinal: No change in bowel habit Genito-Urinary: No urinary tract symptoms Musculoskeletal: No muscle bone or joint pain Neurologic: No headache or change in vision Skin: No rash Remaining ROS negative.  Physical Exam: Blood pressure 126/87, pulse 80, temperature 97.5 F (36.4 C), temperature source Oral, resp. rate 18, height 6' (1.829 m), weight 204 lb 8 oz (92.761 kg). Wt Readings from Last 3 Encounters:  03/27/12 204 lb 8 oz (92.761 kg)  11/29/11 206 lb 4.8 oz (93.577 kg)  07/27/11 202 lb 11.2 oz (91.944 kg)     General appearance: Well-nourished Caucasian man HENNT: Pharynx no erythema or exudate Lymph nodes: No cervical, supraclavicular, axillary, or inguinal lymphadenopathy Breasts: Lungs: Clear to auscultation resonant to percussion Heart: Regular rhythm no murmur Abdomen: Soft, nontender, no mass, no organomegaly Extremities: No edema, no calf tenderness Vascular: No cyanosis Neurologic: No focal deficit Skin: No rash or ecchymosis  Lab Results: Lab Results  Component Value Date   WBC 8.7 03/24/2012   HGB 15.9 03/24/2012   HCT 47.6 03/24/2012   MCV 88.2 03/24/2012   PLT 203 03/24/2012     Chemistry      Component Value Date/Time   NA 141 03/24/2012 1024   NA 139 07/20/2011 0800   NA 141 04/26/2011 0941   K 4.3 03/24/2012 1024   K 4.4 07/20/2011 0800   K 4.7 04/26/2011 0941  CL 104 03/24/2012 1024   CL 100 07/20/2011 0800   CL 106 04/26/2011 0941   CO2 26 03/24/2012 1024   CO2 29 07/20/2011 0800   CO2 26 04/26/2011 0941   BUN 16.7 03/24/2012 1024   BUN 14 07/20/2011 0800   BUN 15 04/26/2011 0941   CREATININE 1.1 03/24/2012 1024   CREATININE 1.2 07/20/2011 0800   CREATININE  1.00 04/26/2011 0941      Component Value Date/Time   CALCIUM 9.6 03/24/2012 1024   CALCIUM 8.7 07/20/2011 0800   CALCIUM 9.3 04/26/2011 0941   ALKPHOS 63 03/24/2012 1024   ALKPHOS 55 07/20/2011 0800   ALKPHOS 59 03/16/2011 1522   AST 20 03/24/2012 1024   AST 25 07/20/2011 0800   AST 23 03/16/2011 1522   ALT 25 03/24/2012 1024   ALT 31 03/16/2011 1522   BILITOT 0.66 03/24/2012 1024   BILITOT 1.00 07/20/2011 0800   BILITOT 0.4 03/16/2011 1522       Radiological Studies: Ct Soft Tissue Neck W Contrast  03/24/2012  *RADIOLOGY REPORT*  Clinical Data: Non-Hodgkins lymphoma.  CT NECK WITH CONTRAST  Technique:  Multidetector CT imaging of the neck was performed with intravenous contrast.  Contrast: 50mL OMNIPAQUE IOHEXOL 300 MG/ML  SOLN, OMNIPAQUE IOHEXOL 300 MG/ML  SOLN  Comparison: CT neck 11/22/2011  Findings: Limited imaging of the brain is unremarkable.  The patient is status post resection of the left submandibular gland. The parotid glands and right submandibular gland are within normal limits.  No significant cervical adenopathy is present to suggest residual or recurrent lymphoma.  Emphysematous changes are again noted at the lung apices.  The multilevel endplate degenerative changes are again noted in the cervical spine.  General retrolisthesis at C3-4 is stable.  IMPRESSION: The 1.  No evidence for recurrent to lymphoma in the neck. 2.  Status post resection of the left submandibular gland. 3.  Stable cervical spondylosis.   Original Report Authenticated By: Marin Roberts, M.D.    Ct Chest W Contrast  03/24/2012  *RADIOLOGY REPORT*  Clinical Data:  Restaging lymphoma  CT CHEST, ABDOMEN AND PELVIS WITH CONTRAST  Technique:  Multidetector CT imaging of the chest, abdomen and pelvis was performed following the standard protocol during bolus administration of intravenous contrast.  Contrast: 50mL OMNIPAQUE IOHEXOL 300 MG/ML  SOLN, OMNIPAQUE IOHEXOL 300 MG/ML  SOLN  Comparison:  11/22/2011  CT  CHEST  Findings:  Lungs/pleura: No pleural effusion identified.  Mild changes of emphysema.  Postop changes are identified within the posterior right lower lobe.  No airspace consolidation.  No suspicious nodule or mass.  Heart/Mediastinum: The heart size is normal.  No pericardial effusion.  No enlarged mediastinal or hilar adenopathy.  Bones/Musculoskeletal:  Review of the visualized osseous structures is unremarkable.  No worrisome lytic or sclerotic bone lesions.  IMPRESSION:  1.  Stable CT of the chest. 2.  No mass or adenopathy.  CT ABDOMEN AND PELVIS  Findings:  Low attenuation structure within the left hepatic lobe is unchanged measuring 9 mm.  There is a new small low attenuation structure within the right hepatic lobe measuring 5 mm.  This is too small to characterize.  Previous cholecystectomy.  Chronic increased caliber of the common bile duct is unchanged from previous exam measuring up to 1.4 cm.  The pancreas is unremarkable.  The spleen is normal.  Normal appearance of the adrenal glands.  The right kidney is normal.  There is a cyst within the inferior pole  of the left kidney which measures 1.5 cm.  Urinary bladder is unremarkable. There is prostate gland enlargement.  Calcified atherosclerotic disease affects the abdominal aorta. There is no aneurysm.  Soft tissue haziness is within the upper abdomen is again noted surrounding the celiac and base of the superior mesenteric artery.  This is unchanged when compared with previous exam.  The small retrocrural lymph node is stable measuring 6 mm, image 59/series 2.  Just below the aortic bifurcation there is a right common iliac lymph node measuring 8 mm, image 98/series 2.  This is new from previous exam.  The right external iliac lymph node is new from prior exam and measures 9 mm, image 109/series 2.  More distally there is an external iliac node measuring 9 mm, image 113/series 2.  Previously this measured 3 mm.  Moderate size hiatal hernia.  The  stomach and small bowel loops are otherwise unremarkable.  Normal appearance of the proximal colon. There is multiple distal colonic diverticula without acute inflammation.  Review of the visualized osseous structures is significant for lumbar spondylosis.  IMPRESSION:  1.  Previously referenced sub centimeter retrocrural lymph node is stable measuring 6 mm. 2.  There are sub centimeter right common and right external iliac lymph nodes which have increased in size from previous exam. Although these remain "nonpathologically" enlarged the change in size warrants close interval follow-up. 3.  Prior cholecystectomy with chronic increased caliber of the CBD.  No obstructing stone or mass noted.   Original Report Authenticated By: Signa Kell, M.D.    Ct Abdomen Pelvis W Contrast  03/24/2012  *RADIOLOGY REPORT*  Clinical Data:  Restaging lymphoma  CT CHEST, ABDOMEN AND PELVIS WITH CONTRAST  Technique:  Multidetector CT imaging of the chest, abdomen and pelvis was performed following the standard protocol during bolus administration of intravenous contrast.  Contrast: 50mL OMNIPAQUE IOHEXOL 300 MG/ML  SOLN, OMNIPAQUE IOHEXOL 300 MG/ML  SOLN  Comparison:  11/22/2011  CT CHEST  Findings:  Lungs/pleura: No pleural effusion identified.  Mild changes of emphysema.  Postop changes are identified within the posterior right lower lobe.  No airspace consolidation.  No suspicious nodule or mass.  Heart/Mediastinum: The heart size is normal.  No pericardial effusion.  No enlarged mediastinal or hilar adenopathy.  Bones/Musculoskeletal:  Review of the visualized osseous structures is unremarkable.  No worrisome lytic or sclerotic bone lesions.  IMPRESSION:  1.  Stable CT of the chest. 2.  No mass or adenopathy.  CT ABDOMEN AND PELVIS  Findings:  Low attenuation structure within the left hepatic lobe is unchanged measuring 9 mm.  There is a new small low attenuation structure within the right hepatic lobe measuring 5 mm.   This is too small to characterize.  Previous cholecystectomy.  Chronic increased caliber of the common bile duct is unchanged from previous exam measuring up to 1.4 cm.  The pancreas is unremarkable.  The spleen is normal.  Normal appearance of the adrenal glands.  The right kidney is normal.  There is a cyst within the inferior pole of the left kidney which measures 1.5 cm.  Urinary bladder is unremarkable. There is prostate gland enlargement.  Calcified atherosclerotic disease affects the abdominal aorta. There is no aneurysm.  Soft tissue haziness is within the upper abdomen is again noted surrounding the celiac and base of the superior mesenteric artery.  This is unchanged when compared with previous exam.  The small retrocrural lymph node is stable measuring 6 mm, image 59/series  2.  Just below the aortic bifurcation there is a right common iliac lymph node measuring 8 mm, image 98/series 2.  This is new from previous exam.  The right external iliac lymph node is new from prior exam and measures 9 mm, image 109/series 2.  More distally there is an external iliac node measuring 9 mm, image 113/series 2.  Previously this measured 3 mm.  Moderate size hiatal hernia.  The stomach and small bowel loops are otherwise unremarkable.  Normal appearance of the proximal colon. There is multiple distal colonic diverticula without acute inflammation.  Review of the visualized osseous structures is significant for lumbar spondylosis.  IMPRESSION:  1.  Previously referenced sub centimeter retrocrural lymph node is stable measuring 6 mm. 2.  There are sub centimeter right common and right external iliac lymph nodes which have increased in size from previous exam. Although these remain "nonpathologically" enlarged the change in size warrants close interval follow-up. 3.  Prior cholecystectomy with chronic increased caliber of the CBD.  No obstructing stone or mass noted.   Original Report Authenticated By: Signa Kell, M.D.      Impression and Plan: #1. Mixed  low-grade and high-grade non-Hodgkin's lymphoma with histology on a left submandibular gland excised April, 2013 with low-grade histology, single right pulmonary nodule resected in November 2008 with low-grade histology. There are now some minor changes in subcentimeter iliac and pelvic lymph nodes which are not of  clinical concern at the moment.  Plan: I will get a short interval followup study in 4 months. Patient will call in the interim if any new signs or symptoms develop.  #2. Chronic stable polycythemia with normal erythropoietin levels. Current hemoglobin is back in the normal range at 15.9 g.  CC:.    Levert Feinstein, MD 3/17/20141:49 PM

## 2012-03-28 ENCOUNTER — Encounter: Payer: Self-pay | Admitting: Physician Assistant

## 2012-04-03 ENCOUNTER — Encounter: Payer: Self-pay | Admitting: *Deleted

## 2012-04-03 ENCOUNTER — Telehealth: Payer: Self-pay | Admitting: Physician Assistant

## 2012-04-03 DIAGNOSIS — H81399 Other peripheral vertigo, unspecified ear: Secondary | ICD-10-CM | POA: Diagnosis not present

## 2012-04-03 NOTE — Telephone Encounter (Signed)
Anthony Kelly, her husband would like to be seen by ACM today,  If not ACM he will see someone else. Please call

## 2012-04-03 NOTE — Telephone Encounter (Signed)
APPT MADE

## 2012-04-03 NOTE — Progress Notes (Signed)
Patient ID: Anthony Kelly, male   DOB: October 25, 1945, 67 y.o.   MRN: [id number] DUE TO WAIT TIME AT OUR OFFICE PATIENT DECIDED NOT TO COME TODAY  AND MAY GO TO URGENT CARE TODAY.

## 2012-04-05 ENCOUNTER — Telehealth: Payer: Self-pay | Admitting: Physician Assistant

## 2012-04-05 NOTE — Telephone Encounter (Signed)
Wants something for allergies that is not an antihistamine. [facility] [place]

## 2012-04-06 ENCOUNTER — Other Ambulatory Visit: Payer: Self-pay | Admitting: Physician Assistant

## 2012-04-06 DIAGNOSIS — J309 Allergic rhinitis, unspecified: Secondary | ICD-10-CM

## 2012-04-06 MED ORDER — CETIRIZINE HCL 10 MG PO TABS: 10.0000 mg | ORAL_TABLET | Freq: Every day | ORAL | Status: DC

## 2012-04-06 NOTE — Telephone Encounter (Signed)
Patient c/o nasal pain, frontal head pain, and a runny nose.  He is currently using a saline nasal spray and wanted to know if there is anything else he can use or take.  He has an irregular heart beat and enlarged prostate so antihistamines and decongestants are not an option.  Kendal Hymen doesn't want to give him anything that may cause side effects.  Discussed that a prescription nasal spray may be an option but there are possible side effects to every medication.  Suggested a saline nasal rinse with distilled water for saline solution and to call back if symptoms worsen or persist.  She stated understanding and agreed to plan.----[facility], RN

## 2012-04-10 ENCOUNTER — Telehealth: Payer: Self-pay | Admitting: *Deleted

## 2012-04-10 NOTE — Telephone Encounter (Signed)
Received vm from pt's wife, Kendal Hymen stating that they had seen scans that showed that pt had calcifications of coronary artery & abd aorta & reports that he has a family hx but PCP doesn't seem interested in persuing any of this & would like to know from Dr. Cyndie Chime how bad this is.  Message left for Dr Cyndie Chime.  Call back # is [phone].

## 2012-04-12 ENCOUNTER — Telehealth: Payer: Self-pay | Admitting: *Deleted

## 2012-04-12 NOTE — Telephone Encounter (Signed)
Late Entry:  Message left on pt's vm that if pt is not having any symptoms there is no immediate concern but it would be worthwhile for him to see a cardiologist & get a stress test done per Dr Cyndie Chime.  She called back this am & left message that she received message & pt is jogging some & thinks he is getting a stress test this way.

## 2012-05-29 ENCOUNTER — Encounter: Payer: Self-pay | Admitting: Internal Medicine

## 2012-05-29 ENCOUNTER — Ambulatory Visit (INDEPENDENT_AMBULATORY_CARE_PROVIDER_SITE_OTHER): Payer: Medicare Other | Admitting: Internal Medicine

## 2012-05-29 ENCOUNTER — Encounter: Payer: Self-pay | Admitting: *Deleted

## 2012-05-29 VITALS — BP 118/68 | HR 78 | Resp 16 | Ht 72.0 in | Wt 199.2 lb

## 2012-05-29 DIAGNOSIS — I2510 Atherosclerotic heart disease of native coronary artery without angina pectoris: Secondary | ICD-10-CM

## 2012-05-29 NOTE — Patient Instructions (Addendum)
Your physician recommends that you have the following fasting labs:  LIPOMED.  We will call you with the results.

## 2012-05-29 NOTE — Progress Notes (Signed)
HPI Patient is a 67 yo who presents on self referral for cardiac care. The patient was previously followed by [facility]  At [facility]. He as a history of SVT  Had episodes of tachycardia with dizziness (no syncope) several years ago.  Captured on event monitor.  Placed on Toprol XL  25 mg  Has not had any further spells Also has a history of nonHodgkin's lymphoma  Rx with CHOP/Rituxin in 2008/10.  Followed by Dr. Cyndie Chime. A CT was done for follow up  This showed coronary calcifications. The patient has a strong fhx of CAD, even when family members have good chol values.  Patient is active  Jogs a few times per week.  Denies SOB  No CP  No dizziness.  No palpitations. No Known Allergies  Current Outpatient Prescriptions  Medication Sig Dispense Refill  . aspirin 325 MG tablet Take 325 mg by mouth daily.      . Lactobacillus (ACIDOPHILUS/BIFIDUS PO) Take 1 tablet by mouth daily.      . metoprolol succinate (TOPROL-XL) 25 MG 24 hr tablet Take 25 mg by mouth daily.      . Multiple Vitamin (MULTIVITAMIN) tablet Take 1 tablet by mouth daily.      . nabumetone (RELAFEN) 500 MG tablet Take 500 mg by mouth 2 (two) times daily as needed for pain.       No current facility-administered medications for this visit.    Past Medical History  Diagnosis Date  . Dysrhythmia     SVT sees Dr. Eldridge Dace had recent EKG at office  . Arthritis   . 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 Metastasis to lymph nodes dx'd 03/2011    lt submandibular ln    Past Surgical History  Procedure Laterality Date  . Lung lobectomy      right side  . Cholecystectomy    . Exploratory laparotomy    . Lymph node biopsy       in groin with removal  . Meniscus repair      right knee  . Vein ligation and stripping      right leg  . Tonsillectomy      as a child  . Submandibular gland excision  04/2011  . Submandibular gland excision  04/30/2011    Procedure: EXCISION SUBMANDIBULAR GLAND;  Surgeon: Osborn Coho, MD;  Location: [facility] OR;  Service: ENT;  Laterality: Left;  WITH DIAGNOSTIC BIOPSY    Family History  Problem Relation Age of Onset  . Anesthesia problems Neg Hx     History   Social History  . Marital Status: Married    Spouse Name: N/A    Number of Children: N/A  . Years of Education: N/A   Occupational History  . Not on file.   Social History Main Topics  . Smoking status: Former Smoker -- 1.50 packs/day for 25 years    Quit date: 01/11/1990  . Smokeless tobacco: Never Used  . Alcohol Use: No  . Drug Use: No  . Sexually Active: Not Currently   Other Topics Concern  . Not on file   Social History Narrative  . No narrative on file    Review of  Systems:  All systems reviewed.  They are negative to the above problem except as previously stated.  Vital Signs: BP 118/68  Pulse 78  Resp 16  Ht 6' (1.829 m)  Wt 199 lb 4 oz (90.379 kg)  BMI 27.02 kg/m2  Physical Exam Patient in NAD HEENT:  Normocephalic, atraumatic. EOMI, PERRLA.  Neck: JVP is normal.  No bruits.  Lungs: clear to auscultation. No rales no wheezes.  Heart: Regular rate and rhythm. Normal S1, S2. No S3.   No significant murmurs. PMI not displaced.  Abdomen:  Supple, nontender. Normal bowel sounds. No masses. No hepatomegaly.  Extremities:   Good distal pulses throughout. No lower extremity edema.  Musculoskeletal :moving all extremities.  Neuro:   alert and oriented x3.  CN II-XII grossly intact.  EKG  SR 78  Nonspecific ST T wave changes.  Assessment and Plan:  1.  Coronary calcification.  Marker of CAD/plaquing.  I would keep on asa  He can take 81 mg but he is already on 325, wants to stay.   I would  recomm checking a Lipomed panel to look at particle number, proteins  Try to explain calcification in setting of "good" numbers. (LDL was 76, HDL 40, tri 187, Total 141) Will review and contact patient regarding further Rx.  2. SVT  No recurrence  Tolerating low dose toprol  Will not change.  Recom he stay active as he is doing.

## 2012-05-30 ENCOUNTER — Other Ambulatory Visit: Payer: Self-pay | Admitting: Internal Medicine

## 2012-05-30 ENCOUNTER — Encounter: Payer: Self-pay | Admitting: Internal Medicine

## 2012-05-30 DIAGNOSIS — I2510 Atherosclerotic heart disease of native coronary artery without angina pectoris: Secondary | ICD-10-CM | POA: Diagnosis not present

## 2012-06-02 LAB — NMR LIPOPROFILE WITH LIPIDS
HDL Particle Number: 30.8 umol/L (ref >=30.5)
HDL-C: 36 mg/dL — ABNORMAL LOW (ref >=40)
Large HDL-P: 3.3 umol/L — ABNORMAL LOW (ref >=4.8)
Triglycerides: 153 mg/dL — ABNORMAL HIGH (ref <150)

## 2012-06-07 ENCOUNTER — Telehealth: Payer: Self-pay | Admitting: *Deleted

## 2012-06-07 NOTE — Telephone Encounter (Signed)
PT CALLING TO GO OVER LAB RESULTS HE GOT IN MAIL. PT ALSO WOULD LIKE TO KNOW IF HE NEEDS TO START ON LOW DOSE STATIN. STATES THAT DR ROSS TALKED ABOUT IT AT LAST VISIT.

## 2012-06-07 NOTE — Telephone Encounter (Signed)
Please advise 

## 2012-06-07 NOTE — Telephone Encounter (Signed)
Anthony Kelly Please review.  Patient with atherosclerotic plaquing of abdominal aorta on CT Recommendations?

## 2012-06-08 NOTE — Telephone Encounter (Signed)
Delay in review because labs not forwarded. Reviewed with S Putt. Would recomm checking ApoB 100 and Lp(a). Would start on lipitor 10 mg. F/U lipomed in 8 wks. Check Copay for lipitor and crestor.

## 2012-06-08 NOTE — Telephone Encounter (Signed)
Reviewed Lipoprofile.  LDL-P and LDL-C good.  ? If apoB or Lp(a) may be elevated- to explain plaque buildup.  Not part of the lipoprofile so unsure.  Pt's 10-year risk - 14.4%.  Per current guidelines he would meet treatment threshold (>7.5% for primary prevention) Would treat with statin.   Would suggest Lipitor or Crestor, depending on insurance coverage.

## 2012-07-12 ENCOUNTER — Telehealth: Payer: Self-pay | Admitting: Internal Medicine

## 2012-07-12 NOTE — Telephone Encounter (Signed)
PATIENT NEEDS METOPROLOL SENT TO [facility] / [facility]

## 2012-07-12 NOTE — Telephone Encounter (Signed)
ALSO WOULD LIKE A RETURN PHONE CALL REGARDING BLOOD WORK RESULTS / [facility]

## 2012-07-20 ENCOUNTER — Other Ambulatory Visit (HOSPITAL_BASED_OUTPATIENT_CLINIC_OR_DEPARTMENT_OTHER): Payer: Medicare Other | Admitting: Lab

## 2012-07-20 ENCOUNTER — Ambulatory Visit (HOSPITAL_COMMUNITY)
Admission: RE | Admit: 2012-07-20 | Discharge: 2012-07-20 | Disposition: A | Discharge status: Home / Self Care | Payer: Medicare Other | Source: Ambulatory Visit | Attending: Oncology | Admitting: Oncology

## 2012-07-20 ENCOUNTER — Other Ambulatory Visit: Payer: Self-pay | Admitting: *Deleted

## 2012-07-20 DIAGNOSIS — K838 Other specified diseases of biliary tract: Secondary | ICD-10-CM | POA: Diagnosis not present

## 2012-07-20 DIAGNOSIS — I2510 Atherosclerotic heart disease of native coronary artery without angina pectoris: Secondary | ICD-10-CM | POA: Insufficient documentation

## 2012-07-20 DIAGNOSIS — C7800 Secondary malignant neoplasm of unspecified lung: Secondary | ICD-10-CM | POA: Diagnosis not present

## 2012-07-20 DIAGNOSIS — K5730 Diverticulosis of large intestine without perforation or abscess without bleeding: Secondary | ICD-10-CM | POA: Insufficient documentation

## 2012-07-20 DIAGNOSIS — K449 Diaphragmatic hernia without obstruction or gangrene: Secondary | ICD-10-CM | POA: Diagnosis not present

## 2012-07-20 DIAGNOSIS — Z9089 Acquired absence of other organs: Secondary | ICD-10-CM | POA: Diagnosis not present

## 2012-07-20 DIAGNOSIS — M5137 Other intervertebral disc degeneration, lumbosacral region: Secondary | ICD-10-CM | POA: Diagnosis not present

## 2012-07-20 DIAGNOSIS — K118 Other diseases of salivary glands: Secondary | ICD-10-CM

## 2012-07-20 DIAGNOSIS — N281 Cyst of kidney, acquired: Secondary | ICD-10-CM | POA: Diagnosis not present

## 2012-07-20 DIAGNOSIS — Z9221 Personal history of antineoplastic chemotherapy: Secondary | ICD-10-CM | POA: Insufficient documentation

## 2012-07-20 DIAGNOSIS — J438 Other emphysema: Secondary | ICD-10-CM | POA: Insufficient documentation

## 2012-07-20 DIAGNOSIS — R599 Enlarged lymph nodes, unspecified: Secondary | ICD-10-CM | POA: Insufficient documentation

## 2012-07-20 DIAGNOSIS — C8590 Non-Hodgkin lymphoma, unspecified, unspecified site: Secondary | ICD-10-CM

## 2012-07-20 DIAGNOSIS — C8589 Other specified types of non-Hodgkin lymphoma, extranodal and solid organ sites: Secondary | ICD-10-CM | POA: Diagnosis not present

## 2012-07-20 DIAGNOSIS — C8510 Unspecified B-cell lymphoma, unspecified site: Secondary | ICD-10-CM

## 2012-07-20 DIAGNOSIS — M51379 Other intervertebral disc degeneration, lumbosacral region without mention of lumbar back pain or lower extremity pain: Secondary | ICD-10-CM | POA: Insufficient documentation

## 2012-07-20 LAB — COMPREHENSIVE METABOLIC PANEL (CC13)
AST: 22 U/L (ref 5–34)
Albumin: 3.7 g/dL (ref 3.5–5.0)
BUN: 20.6 mg/dL (ref 7.0–26.0)
Calcium: 9.4 mg/dL (ref 8.4–10.4)
Chloride: 104 mEq/L (ref 98–109)
Glucose: 76 mg/dl (ref 70–140)
Potassium: 4.5 mEq/L (ref 3.5–5.1)

## 2012-07-20 LAB — CBC WITH DIFFERENTIAL/PLATELET
Eosinophils Absolute: 0.2 10*3/uL (ref 0.0–0.5)
HCT: 47.8 % (ref 38.4–49.9)
HGB: 16.5 g/dL (ref 13.0–17.1)
LYMPH%: 18.7 % (ref 14.0–49.0)
MONO#: 0.7 10*3/uL (ref 0.1–0.9)
NEUT#: 5.1 10*3/uL (ref 1.5–6.5)
NEUT%: 68.8 % (ref 39.0–75.0)
Platelets: 202 10*3/uL (ref 140–400)
WBC: 7.4 10*3/uL (ref 4.0–10.3)

## 2012-07-20 LAB — SEDIMENTATION RATE: Sed Rate: 1 mm/hr (ref 0–16)

## 2012-07-20 IMAGING — CT CT CHEST W/ CM
3 of 9 series · 16 of 46 positions shown, 18 images · IV contrast (OMNIPAQUE)
Comparison: [DATE]

CT CHEST

CLINICAL DATA: Non-Hodgkins lymphoma with lung metastasis and left
submandibular node [DATE].  Chemotherapy complete [DATE].  Right groin
pain.  Right lower lobectomy.

CT CHEST, ABDOMEN AND PELVIS WITH CONTRAST
TECHNIQUE: Contiguous axial images of the chest abdomen and pelvis
were obtained after IV contrast administration.
Contrast: 125 ml [CC]

[Series 2: cap st · axial · 0.86mm/px · z∈[-802,-277]mm · 8 of 137 slices shown, 10 images]
[im 16/137  soft-tissue]
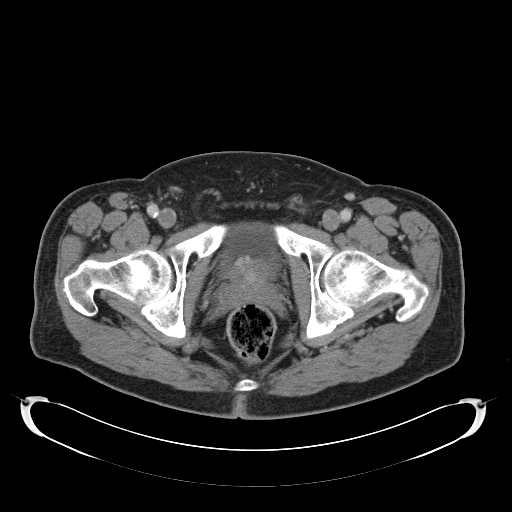
[im 16/137  lung]
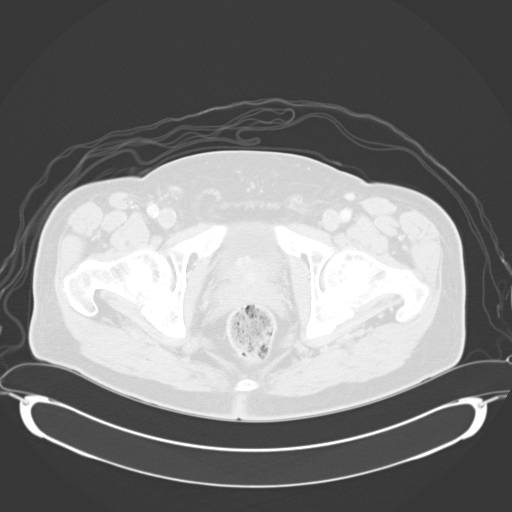
[im 31/137  lung]
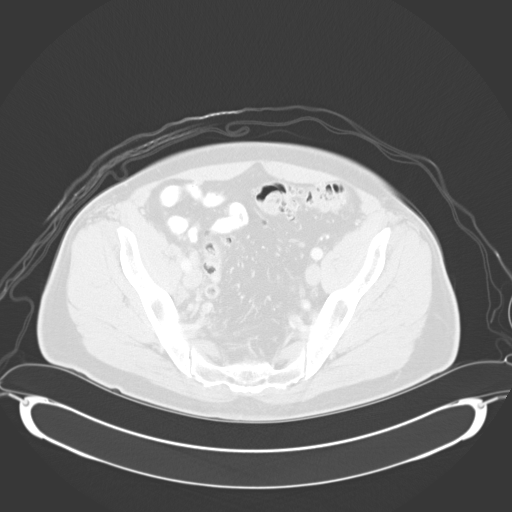
[im 46/137  lung]
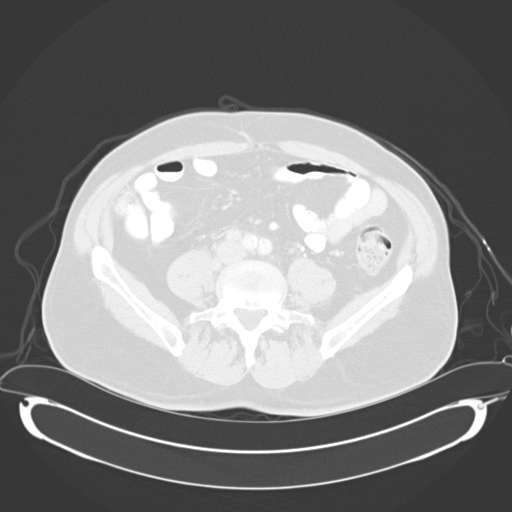
[im 61/137  lung]
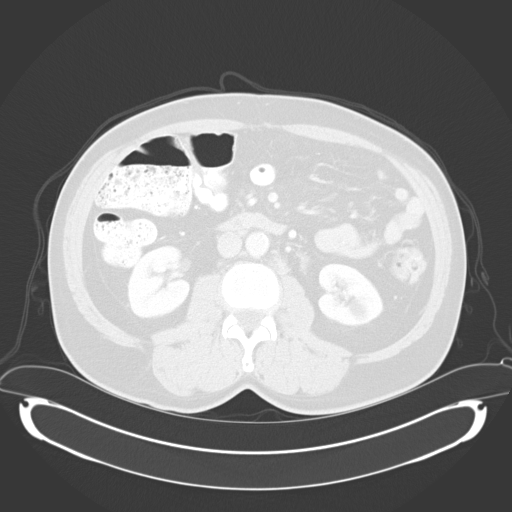
[im 76/137  soft-tissue]
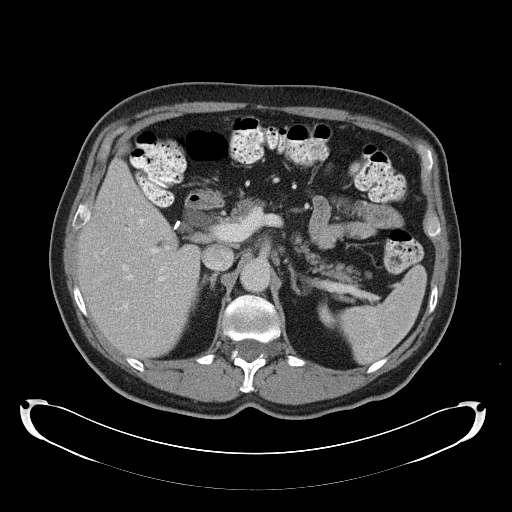
[im 76/137  lung]
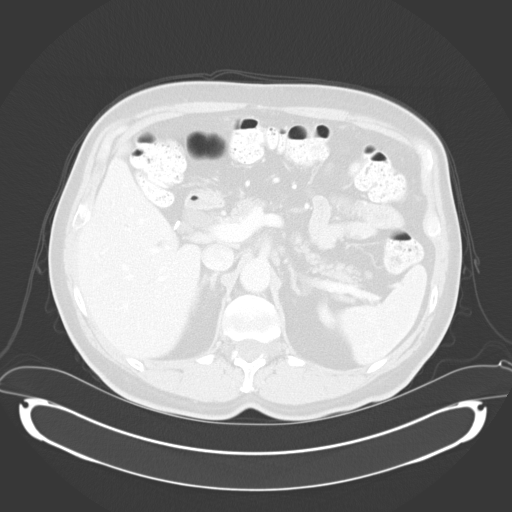
[im 91/137  lung]
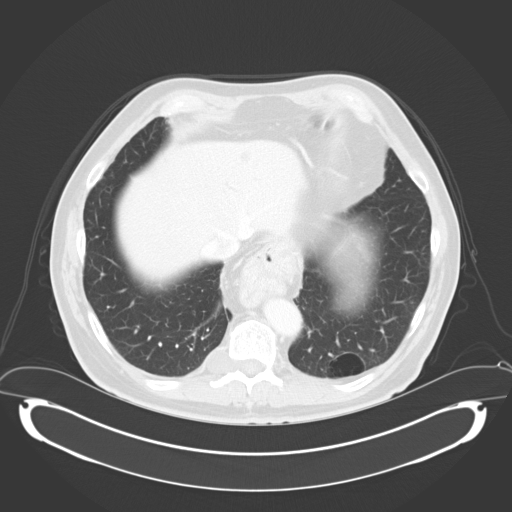
[im 106/137  lung]
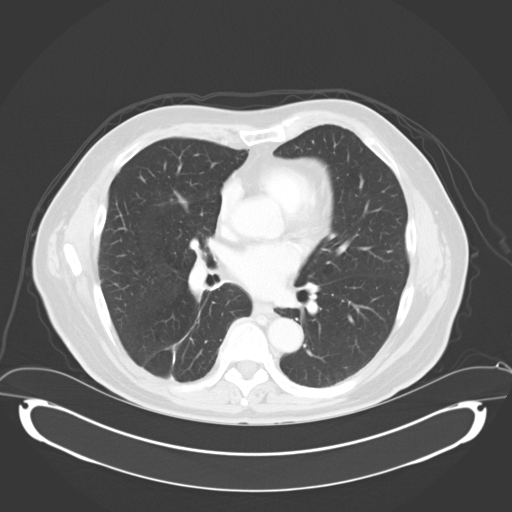
[im 121/137  lung]
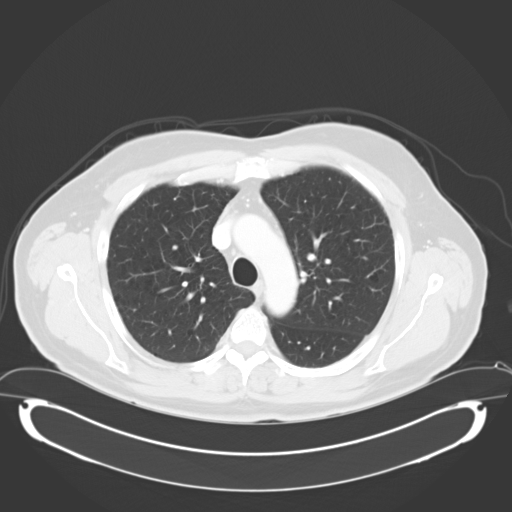

[Series 3: neck · axial · 0.46mm/px · z∈[-235,-59]mm · 6 of 124 slices shown]
[im 18/124  lung]
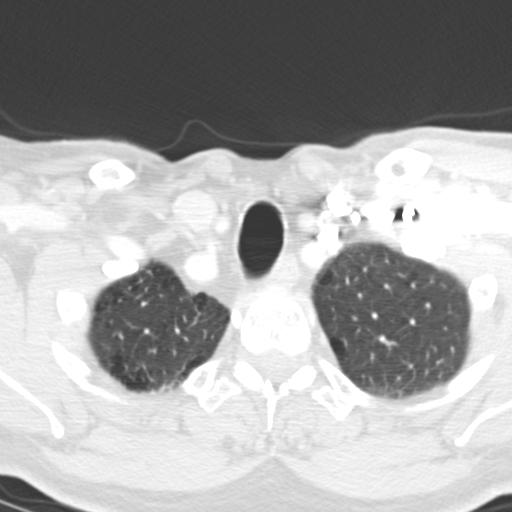
[im 36/124  lung]
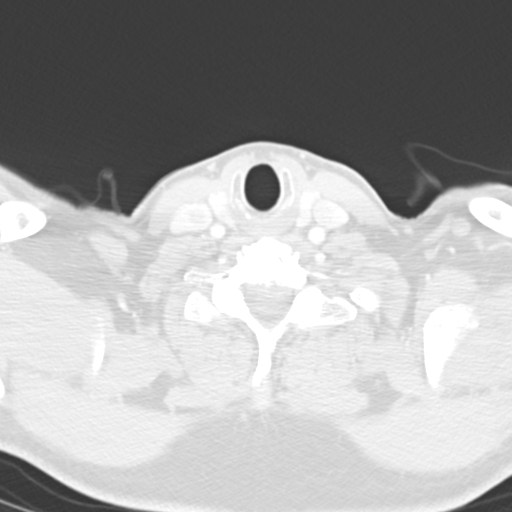
[im 53/124  lung]
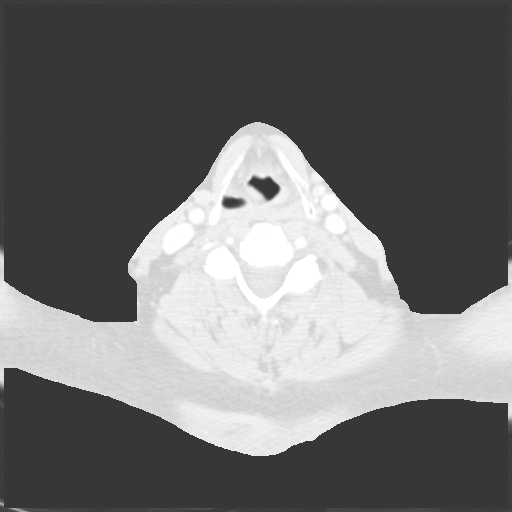
[im 71/124  lung]
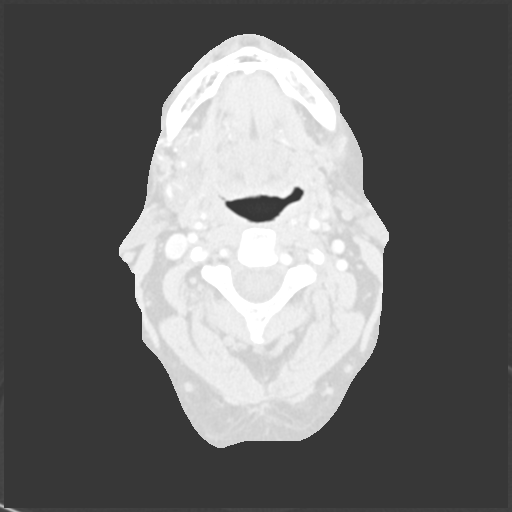
[im 88/124  lung]
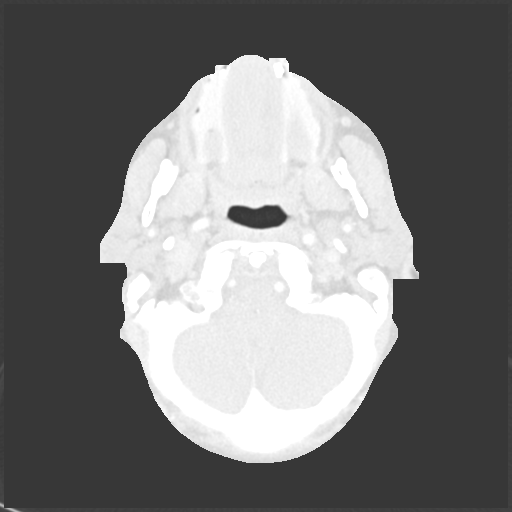
[im 106/124  lung]
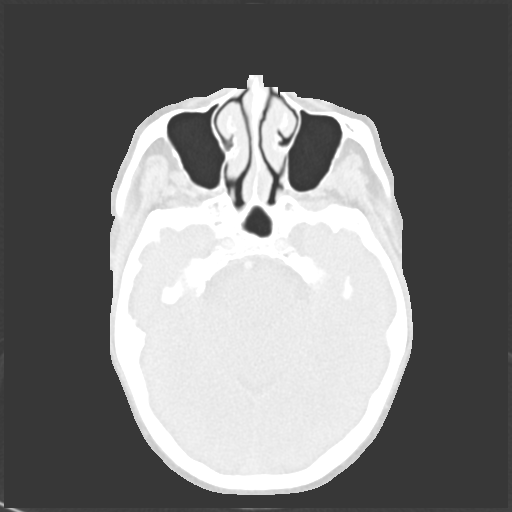

[Series 6: lung windows · axial · 0.86mm/px · z∈[-397,-297]mm · 2 of 61 slices shown]
[im 21/61  lung]
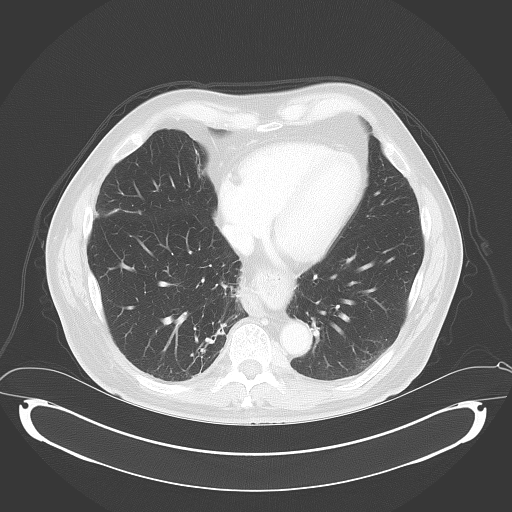
[im 41/61  lung]
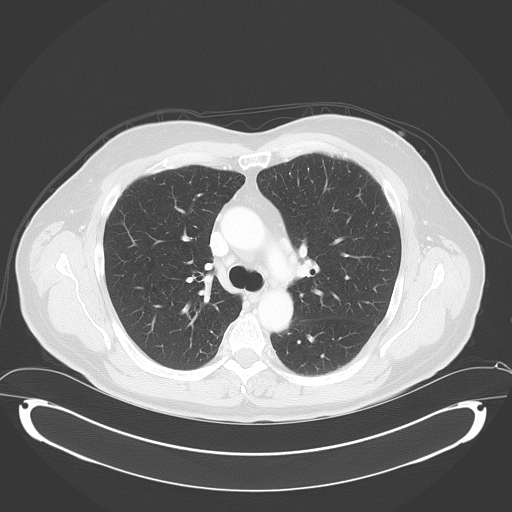

[16 of 46 positions shown; findings below may reference images not displayed]

FINDINGS: Lung windows demonstrate surgical changes about the right
lower lobe.  Mild centrilobular and paraseptal emphysema.

No suspicious pulmonary nodule or mass.

Soft tissue windows demonstrate no supraclavicular adenopathy.
No axillary adenopathy.  Tortuous descending thoracic aorta.
Normal heart size, without pericardial effusion.   Multivessel
coronary artery atherosclerosis.  No central pulmonary embolism, on
this non-dedicated study. Paravertebral soft tissue thickening is
identified bilaterally.  Slightly greater on the left, measuring
maximally 1.3 cm on image 36.  This appears new since [DATE].
The right-sided paravertebral thickening on image 32/series 2 is
similar back to [DATE].

No middle mediastinal adenopathy.  A right hilar lymph node is
unchanged measures 1.3 cm image 27/series 2.  Upper normal.

A moderate hiatal hernia.
Prevascular nodes measure up to 6 mm, similar to 7 mm on the prior.
A 9 mm retrocrural node on image 55/series 2 is increased from 6 mm
on the prior.

Incidental note is made of areas of right-sided nerve root ectasia,
including on image 47/series 2.  These are unchanged.
IMPRESSION: 1.  Enlargement of a retrocrural node.  Given the appearance of the
abdomen, suspicious for lymphomatous involvement.
2.  Developing left paravertebral soft tissue thickening.  This is
also suspicious for lymphomatous involvement, given the appearance
of the abdomen.  PET may be informative for complete staging.

CT ABDOMEN AND PELVIS
FINDINGS: Similar 9 mm left hepatic lobe cyst.  Cholecystectomy
with mild intrahepatic biliary ductal dilatation, unchanged.
Normal spleen, distal stomach, pancreas, adrenal glands.  Common
duct dilatation which is not changed at 1.4 cm.  No cause
identified.

Normal adrenal glands and right kidney.  Interpolar left renal
cyst.

Developing retroperitoneal adenopathy.  Index preaortic node
measures 1.1 cm on image 89 and is new.  Similar haziness in the
region of the celiac origin which may be treatment related,
including on image 61/series 2.

Extensive colonic diverticulosis.  Normal small bowel without
abdominal ascites.

A right common iliac node which measures 1.6 cm on image 97,
increased from 8 mm on the prior.
Right external iliac node measures 1.1 cm on image 108 compared
with 9 mm on the prior.

Normal urinary bladder.  Mild prostatomegaly. No significant free
fluid.

No acute osseous abnormality.  Advanced degenerative disc disease
at the lumbosacral junction.
IMPRESSION: 1. Developing abdominal pelvic adenopathy, consistent with
recurrent lymphoma.
2.  Similar biliary ductal dilatation after cholecystectomy,
without cause identified.

## 2012-07-20 MED ORDER — IOHEXOL 300 MG/ML  SOLN
100.0000 mL | Freq: Once | INTRAMUSCULAR | Status: AC | PRN
  Administered 2012-07-20: 125 mL via INTRAVENOUS

## 2012-07-24 ENCOUNTER — Telehealth: Payer: Self-pay | Admitting: Oncology

## 2012-07-24 ENCOUNTER — Ambulatory Visit (HOSPITAL_BASED_OUTPATIENT_CLINIC_OR_DEPARTMENT_OTHER): Payer: Medicare Other | Admitting: Oncology

## 2012-07-24 ENCOUNTER — Encounter: Payer: Self-pay | Admitting: Internal Medicine

## 2012-07-24 VITALS — BP 120/74 | HR 68 | Temp 97.7°F | Resp 20 | Ht 72.0 in | Wt 196.5 lb

## 2012-07-24 DIAGNOSIS — C8510 Unspecified B-cell lymphoma, unspecified site: Secondary | ICD-10-CM

## 2012-07-24 DIAGNOSIS — E782 Mixed hyperlipidemia: Secondary | ICD-10-CM

## 2012-07-24 DIAGNOSIS — C8589 Other specified types of non-Hodgkin lymphoma, extranodal and solid organ sites: Secondary | ICD-10-CM

## 2012-07-24 NOTE — Telephone Encounter (Signed)
gv and printed appt sched and avs for pt....gv pt barium   °

## 2012-07-25 ENCOUNTER — Other Ambulatory Visit: Payer: Self-pay | Admitting: *Deleted

## 2012-07-25 MED ORDER — ATORVASTATIN CALCIUM 10 MG PO TABS: 10.0000 mg | ORAL_TABLET | Freq: Every day | ORAL | Status: DC

## 2012-07-25 MED ORDER — METOPROLOL SUCCINATE ER 25 MG PO TB24: 25.0000 mg | ORAL_TABLET | Freq: Every day | ORAL | Status: DC

## 2012-07-25 NOTE — Telephone Encounter (Signed)
Noted pt call forwarded between DR Tenny Craw and Weston Brass per pt with lipid clinic, however no notation notes pt notation sent back to nurse to advise pt nor notation noted that pt was contacted by any other staff member to advise results/instructions, this nurse sent RX metoprolol in for pt to [facility] as advised as a rush order and sent pt concerns to DR Tenny Craw to advise pt has not started medication as advised per not made aware of results via telephone note on 06-07-12, wanted to have DR Tenny Craw clarify if any further instructions need to be addressed per late response, will contact pt with Dr Tenny Craw instructions once received

## 2012-07-27 NOTE — Telephone Encounter (Signed)
Spoke to pt wife Kendal Hymen to advise the labs to have drawn in 8 weeks and that we will mail him a letter to advise when to have this done and that Dr Tenny Craw notes he will not need to have an apt to do this, pt wife understood, orders for labs and reminder placed in chart for pt,

## 2012-07-30 NOTE — Progress Notes (Signed)
Hematology and Oncology Follow Up Visit  Anthony Kelly [id number] 1945-09-28 67 y.o. 07/30/2012 10:43 AM   Principle Diagnosis: Encounter Diagnosis  Name Primary?  . Low grade B-cell lymphoma Yes     Interim History:   Followup visit for this  67 year old man who initially presented with left inguinal lymphadenopathy in June 2004 with biopsy showing low grade non-Hodgkin's lymphoma. He was treated with observation alone until November 2005 when the lymph node mass began to grow and repeat biopsy showed conversion to high-grade lymphoma. He achieved a complete remission with CHOP Rituxan. He developed a single right pulmonary lesion in November 2008 found on a routine followup scan. This was surgically resected 12/19/2006 and showed low-grade non-Hodgkin's lymphoma. He was treated with Rituxan intermittently for 2 years between December 2008 in August 2010. At time of a routine followup visit in March of 2013 he pointed out an enlarged left submandibular gland which he had noted for about one month. CT scans of the neck chest abdomen and pelvis showed an isolated lesion within the left submandibular gland. He was referred to ear nose and throat surgery and underwent excision of the gland on 04/30/2011 by Dr. Osborn Coho. Findings were consistent with an isolated focus of low-grade non-Hodgkin's lymphoma. Since the remainder of his restaging evaluation was unremarkable and the only site of obvious disease was surgically removed, I did not feel that we needed to put him back on any specific treatment.  Clinically he is during well. He has had no interim medical problems. He had a previous history of isolated SVT. He had a recent visit with his cardiologist. No medication changes were made. Cardiogram done 05/29/2012 showed some nonspecific changes. He continues on low-dose aspirin and low-dose Toprol-XL 25 mg daily.     Medications: reviewed  Allergies: No Known Allergies  Review of  Systems: Constitutional:   No constitutional symptoms Respiratory: No cough or dyspnea Cardiovascular:  No chest pain or palpitations Gastrointestinal: No change in bowel habit Genito-Urinary: No urinary tract symptoms Musculoskeletal: No muscle bone or joint pain Neurologic: No headache or change in vision Skin: No rash or ecchymosis Remaining ROS negative.  Physical Exam: Blood pressure 120/74, pulse 68, temperature 97.7 F (36.5 C), temperature source Oral, resp. rate 20, height 6' (1.829 m), weight 196 lb 8 oz (89.132 kg). Wt Readings from Last 3 Encounters:  07/24/12 196 lb 8 oz (89.132 kg)  05/29/12 199 lb 4 oz (90.379 kg)  03/27/12 204 lb 8 oz (92.761 kg)     General appearance: Well-nourished Caucasian man HENNT: Pharynx no erythema or exudate Lymph nodes: No cervical, supraclavicular, axillary, or inguinal adenopathy. No recurrent parotid area adenopathy Breasts: Lungs: Clear to auscultation resonant to percussion Heart: Regular rhythm no murmur Abdomen: Soft, nontender, no mass, no organomegaly Extremities: No edema, no calf tenderness Musculoskeletal: No joint deformities GU: Vascular: No carotid bruits, no cyanosis Neurologic: Motor strength 5 over 5, reflexes 1+ symmetric Skin: No rash or ecchymosis  Lab Results: Lab Results  Component Value Date   WBC 7.4 07/20/2012   HGB 16.5 07/20/2012   HCT 47.8 07/20/2012   MCV 87.2 07/20/2012   PLT 202 07/20/2012     Chemistry      Component Value Date/Time   NA 140 07/20/2012 0831   NA 139 07/20/2011 0800   NA 141 04/26/2011 0941   K 4.5 07/20/2012 0831   K 4.4 07/20/2011 0800   K 4.7 04/26/2011 0941   CL 104 03/24/2012 1024   CL  100 07/20/2011 0800   CL 106 04/26/2011 0941   CO2 27 07/20/2012 0831   CO2 29 07/20/2011 0800   CO2 26 04/26/2011 0941   BUN 20.6 07/20/2012 0831   BUN 14 07/20/2011 0800   BUN 15 04/26/2011 0941   CREATININE 1.0 07/20/2012 0831   CREATININE 1.2 07/20/2011 0800   CREATININE 1.00 04/26/2011 0941       Component Value Date/Time   CALCIUM 9.4 07/20/2012 0831   CALCIUM 8.7 07/20/2011 0800   CALCIUM 9.3 04/26/2011 0941   ALKPHOS 75 07/20/2012 0831   ALKPHOS 55 07/20/2011 0800   ALKPHOS 59 03/16/2011 1522   AST 22 07/20/2012 0831   AST 25 07/20/2011 0800   AST 23 03/16/2011 1522   ALT 23 07/20/2012 0831   ALT 31 03/16/2011 1522   BILITOT 0.48 07/20/2012 0831   BILITOT 1.00 07/20/2011 0800   BILITOT 0.4 03/16/2011 1522       Radiological Studies: Ct Soft Tissue Neck W Contrast  07/20/2012   *RADIOLOGY REPORT*  Clinical Data: Metastatic non-Hodgkins lymphoma.  CT NECK WITH CONTRAST  Technique:  Multidetector CT imaging of the neck was performed with intravenous contrast.  Contrast: OMNIPAQUE IOHEXOL 300 MG/ML  SOLN  Comparison: CT neck with contrast 03/24/2012.  Findings: The patient is status post left submandibular gland resection and likely a selective left neck dissection.  The right submandibular gland and bilateral parotid glands are within normal limits.  Submental lymph nodes are stable and appear benign.  No significant cervical adenopathy is present to suggest metastatic disease of the neck.  No focal mucosal or submucosal lesions are present.  The vocal cords are midline and symmetric.  Limited imaging of the brain is unremarkable.  Degenerative changes in the cervical spine are stable.  IMPRESSION:  1.  No significant cervical lymph nodes to suggest residual or recurrent lymphoma. 2.  Stable benign appearing submental lymph nodes. 3.  Stable postoperative changes of the left neck.   Original Report Authenticated By: Marin Roberts, M.D.   Ct Chest W Contrast  07/20/2012   *RADIOLOGY REPORT*  Clinical Data:  Non-Hodgkins lymphoma with lung metastasis and left submandibular node 08/13.  Chemotherapy complete 8/10.  Right groin pain.  Right lower lobectomy.  CT CHEST, ABDOMEN AND PELVIS WITH CONTRAST  Technique: Contiguous axial images of the chest abdomen and pelvis were obtained after IV  contrast administration.  Contrast: 125 ml Omnipaque-300  Comparison: 03/24/2012  CT CHEST  Findings: Lung windows demonstrate surgical changes about the right lower lobe.  Mild centrilobular and paraseptal emphysema.  No suspicious pulmonary nodule or mass.  Soft tissue windows demonstrate no supraclavicular adenopathy. No axillary adenopathy.  Tortuous descending thoracic aorta. Normal heart size, without pericardial effusion.   Multivessel coronary artery atherosclerosis.  No central pulmonary embolism, on this non-dedicated study. Paravertebral soft tissue thickening is identified bilaterally.  Slightly greater on the left, measuring maximally 1.3 cm on image 36.  This appears new since 07/20/2011. The right-sided paravertebral thickening on image 32/series 2 is similar back to 07/20/2011.  No middle mediastinal adenopathy.  A right hilar lymph node is unchanged measures 1.3 cm image 27/series 2.  Upper normal.  A moderate hiatal hernia. Prevascular nodes measure up to 6 mm, similar to 7 mm on the prior. A 9 mm retrocrural node on image 55/series 2 is increased from 6 mm on the prior.  Incidental note is made of areas of right-sided nerve root ectasia, including on image 47/series 2.  These  are unchanged.  IMPRESSION:  1.  Enlargement of a retrocrural node.  Given the appearance of the abdomen, suspicious for lymphomatous involvement. 2.  Developing left paravertebral soft tissue thickening.  This is also suspicious for lymphomatous involvement, given the appearance of the abdomen.  PET may be informative for complete staging.  CT ABDOMEN AND PELVIS  Findings:  Similar 9 mm left hepatic lobe cyst.  Cholecystectomy with mild intrahepatic biliary ductal dilatation, unchanged. Normal spleen, distal stomach, pancreas, adrenal glands.  Common duct dilatation which is not changed at 1.4 cm.  No cause identified.  Normal adrenal glands and right kidney.  Interpolar left renal cyst.  Developing retroperitoneal  adenopathy.  Index preaortic node measures 1.1 cm on image 89 and is new.  Similar haziness in the region of the celiac origin which may be treatment related, including on image 61/series 2.  Extensive colonic diverticulosis.  Normal small bowel without abdominal ascites.  A right common iliac node which measures 1.6 cm on image 97, increased from 8 mm on the prior. Right external iliac node measures 1.1 cm on image 108 compared with 9 mm on the prior.  Normal urinary bladder.  Mild prostatomegaly. No significant free fluid.  No acute osseous abnormality.  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  IMPRESSION:  1. Developing abdominal pelvic adenopathy, consistent with recurrent lymphoma. 2.  Similar biliary ductal dilatation after cholecystectomy, without cause identified.   Original Report Authenticated By: Jeronimo Greaves, M.D.   Ct Abdomen Pelvis W Contrast  07/20/2012   *RADIOLOGY REPORT*  Clinical Data:  Non-Hodgkins lymphoma with lung metastasis and left submandibular node 08/13.  Chemotherapy complete 8/10.  Right groin pain.  Right lower lobectomy.  CT CHEST, ABDOMEN AND PELVIS WITH CONTRAST  Technique: Contiguous axial images of the chest abdomen and pelvis were obtained after IV contrast administration.  Contrast: 125 ml Omnipaque-300  Comparison: 03/24/2012  CT CHEST  Findings: Lung windows demonstrate surgical changes about the right lower lobe.  Mild centrilobular and paraseptal emphysema.  No suspicious pulmonary nodule or mass.  Soft tissue windows demonstrate no supraclavicular adenopathy. No axillary adenopathy.  Tortuous descending thoracic aorta. Normal heart size, without pericardial effusion.   Multivessel coronary artery atherosclerosis.  No central pulmonary embolism, on this non-dedicated study. Paravertebral soft tissue thickening is identified bilaterally.  Slightly greater on the left, measuring maximally 1.3 cm on image 36.  This appears new since 07/20/2011. The  right-sided paravertebral thickening on image 32/series 2 is similar back to 07/20/2011.  No middle mediastinal adenopathy.  A right hilar lymph node is unchanged measures 1.3 cm image 27/series 2.  Upper normal.  A moderate hiatal hernia. Prevascular nodes measure up to 6 mm, similar to 7 mm on the prior. A 9 mm retrocrural node on image 55/series 2 is increased from 6 mm on the prior.  Incidental note is made of areas of right-sided nerve root ectasia, including on image 47/series 2.  These are unchanged.  IMPRESSION:  1.  Enlargement of a retrocrural node.  Given the appearance of the abdomen, suspicious for lymphomatous involvement. 2.  Developing left paravertebral soft tissue thickening.  This is also suspicious for lymphomatous involvement, given the appearance of the abdomen.  PET may be informative for complete staging.  CT ABDOMEN AND PELVIS  Findings:  Similar 9 mm left hepatic lobe cyst.  Cholecystectomy with mild intrahepatic biliary ductal dilatation, unchanged. Normal spleen, distal stomach, pancreas, adrenal glands.  Common duct dilatation which is not changed  at 1.4 cm.  No cause identified.  Normal adrenal glands and right kidney.  Interpolar left renal cyst.  Developing retroperitoneal adenopathy.  Index preaortic node measures 1.1 cm on image 89 and is new.  Similar haziness in the region of the celiac origin which may be treatment related, including on image 61/series 2.  Extensive colonic diverticulosis.  Normal small bowel without abdominal ascites.  A right common iliac node which measures 1.6 cm on image 97, increased from 8 mm on the prior. Right external iliac node measures 1.1 cm on image 108 compared with 9 mm on the prior.  Normal urinary bladder.  Mild prostatomegaly. No significant free fluid.  No acute osseous abnormality.  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  IMPRESSION:  1. Developing abdominal pelvic adenopathy, consistent with recurrent lymphoma. 2.   Similar biliary ductal dilatation after cholecystectomy, without cause identified.   Original Report Authenticated By: Jeronimo Greaves, M.D.    Impression: Low-grade B cell non-Hodgkin's lymphoma with early conversion to high-grade eradicated with CHOP Rituxan with subsequent recurrence of the low-grade component first in his right lung treated with surgical resection followed by adjuvant Rituxan and subsequently in an  isolated left submandibular lymph gland treated with surgical excision alone.  Radiologist reading progressive disease on current CAT scans done 07/20/2012. I reviewed these images in detail with the patient and his wife. I am not convinced that there are any significant changes that warrant immediate treatment. Largest lymph node noted on the entire scan is a right common iliac node measuring 1.6 cm which I can't even see.  Plan: Continue observation alone. I will get a short interval followup study in 3 months. If in fact he is progressing again, he may qualify for current clinical trial looking at Rituxan versus Ofatumumab in patients who are previously had Rituxan as part of their treatment regimen.      CC:.    Levert Feinstein, MD 7/20/201410:43 AM

## 2012-08-08 NOTE — Telephone Encounter (Signed)
The reminder was placed in the chart to have the pt have labs drawn around 09-27-12 which was 8 weeks from telephone notation in email

## 2012-08-10 ENCOUNTER — Encounter: Payer: Self-pay | Admitting: Internal Medicine

## 2012-08-16 ENCOUNTER — Other Ambulatory Visit: Payer: Self-pay

## 2012-08-17 ENCOUNTER — Encounter: Payer: Self-pay | Admitting: Internal Medicine

## 2012-08-17 ENCOUNTER — Telehealth: Payer: Self-pay | Admitting: *Deleted

## 2012-08-17 DIAGNOSIS — R002 Palpitations: Secondary | ICD-10-CM

## 2012-08-17 DIAGNOSIS — I499 Cardiac arrhythmia, unspecified: Secondary | ICD-10-CM

## 2012-08-17 NOTE — Telephone Encounter (Signed)
(  Patient Advice Request )- When you call me, could you please call my home phone: [phone]. Thank you so much.

## 2012-08-17 NOTE — Telephone Encounter (Signed)
Patient advice request:  "I'm having some problems with my heart skipping beats almost on a daily basis. It beats regular 4-13 times and then starts skipping. Should I be concerned?"  (Anthony Kelly Patient) - Spoke with patient, he states this has been occuring for the past week. States that every 4th/5th beat is skipping. He has no other symptoms. He is currently taking Toprol 25mg  daily and exercising routine has not changed.

## 2012-08-22 ENCOUNTER — Encounter: Payer: Self-pay | Admitting: Internal Medicine

## 2012-08-23 NOTE — Telephone Encounter (Signed)
Called pt, wife answered the phone while he was busy. Advised Pt's wife that we set up an appointment for the 48 hr holter monitor. The wife states "we think we know what was causing it, he started taking garlic supplement and when he stopped it everything was fine"  Please advise if you still would like the Holter. The appointment is for August 25 at 12:45 at [facility].

## 2012-08-23 NOTE — Telephone Encounter (Signed)
Spoke with [place] in [place] office and monitor can be placed at hospital in [place].  Will forward to her to contact pt

## 2012-08-23 NOTE — Telephone Encounter (Signed)
Will call pt with holter appointment, once set up. No follow up nor recall noted for this pt to follow up in [place].

## 2012-08-23 NOTE — Telephone Encounter (Signed)
Canceled appointment. Advised pt to continue on toprol 25 and to call if reoccurrence. Pt understood.

## 2012-08-23 NOTE — Telephone Encounter (Signed)
No  If palpitations gone then I would not get monitor.  Call if recurs. Keep on toprol 25

## 2012-08-23 NOTE — Telephone Encounter (Signed)
I am not too concerned about isolated skips if he is not feeling dizzy, SOB Would set up for 48 hour holter monitor to capture/quantitate  Keep on Toprol 25 for now.  Patient follows up in [place] office

## 2012-08-23 NOTE — Telephone Encounter (Signed)
Placed orderm for holter in chart. Message sent to [facility] to schedule pt for holter.

## 2012-09-04 ENCOUNTER — Encounter (HOSPITAL_COMMUNITY): Payer: Medicare Other

## 2012-09-14 ENCOUNTER — Telehealth: Payer: Self-pay

## 2012-09-14 NOTE — Telephone Encounter (Signed)
Additionally asked about labs, advised fasting labs on 09/27/2012 per chart note.

## 2012-09-14 NOTE — Telephone Encounter (Signed)
Patient was seen by Dr. Tenny Craw and was recommended to have a heart monitor.  Patient was feeling better and did not have monitor but was told to call if having additional issues.  Patient has reported that his heart is skipping beats and has been racing, not all the time and both with and without activity for a couple of weeks.  Patient would like to see about getting heart monitor placed now.  Please call.

## 2012-09-14 NOTE — Telephone Encounter (Signed)
Spoke to pt daughter Kendal Hymen, she states that the pt is feeling his heart skip and race again. And wanted to see about getting a heart monitor placed. Ask Kendal Hymen if the pt feels all right, she states he feels fine. This nurese advised Kendal Hymen that the MD would be in our office tomorrow and we will get back in touch with her then. Also advised her about fasting labs on  09/27/12. Please advise

## 2012-09-15 NOTE — Telephone Encounter (Signed)
Please advise 

## 2012-09-16 ENCOUNTER — Other Ambulatory Visit: Payer: Self-pay | Admitting: Physician Assistant

## 2012-09-18 ENCOUNTER — Other Ambulatory Visit: Payer: Self-pay | Admitting: *Deleted

## 2012-09-18 ENCOUNTER — Encounter: Payer: Self-pay | Admitting: *Deleted

## 2012-09-18 DIAGNOSIS — K118 Other diseases of salivary glands: Secondary | ICD-10-CM

## 2012-09-18 DIAGNOSIS — C8510 Unspecified B-cell lymphoma, unspecified site: Secondary | ICD-10-CM

## 2012-09-18 DIAGNOSIS — R002 Palpitations: Secondary | ICD-10-CM

## 2012-09-18 DIAGNOSIS — E782 Mixed hyperlipidemia: Secondary | ICD-10-CM

## 2012-09-18 DIAGNOSIS — C8590 Non-Hodgkin lymphoma, unspecified, unspecified site: Secondary | ICD-10-CM

## 2012-09-18 NOTE — Telephone Encounter (Signed)
Spoke to patient concerning lab/test results/instructions from provider. Patient understood.   Set up appointment for Event monitor for Sept 15.

## 2012-09-18 NOTE — Telephone Encounter (Signed)
Set up for event monitor 

## 2012-09-19 NOTE — Telephone Encounter (Signed)
Last seen 10/21/11   ACM

## 2012-09-23 ENCOUNTER — Other Ambulatory Visit: Payer: Self-pay | Admitting: Family Medicine

## 2012-09-25 ENCOUNTER — Encounter (HOSPITAL_COMMUNITY): Payer: Medicare Other

## 2012-09-25 ENCOUNTER — Ambulatory Visit (HOSPITAL_COMMUNITY)
Admission: RE | Admit: 2012-09-25 | Discharge: 2012-09-25 | Disposition: A | Discharge status: Home / Self Care | Payer: Medicare Other | Source: Ambulatory Visit | Attending: Internal Medicine | Admitting: Internal Medicine

## 2012-09-25 DIAGNOSIS — R002 Palpitations: Secondary | ICD-10-CM | POA: Diagnosis not present

## 2012-09-25 NOTE — Progress Notes (Signed)
Holter Monitor in progress 48 hours.

## 2012-09-26 ENCOUNTER — Encounter (HOSPITAL_COMMUNITY): Payer: Medicare Other

## 2012-09-27 ENCOUNTER — Other Ambulatory Visit: Payer: Self-pay | Admitting: Internal Medicine

## 2012-09-27 DIAGNOSIS — E782 Mixed hyperlipidemia: Secondary | ICD-10-CM | POA: Diagnosis not present

## 2012-09-27 LAB — LIPID PANEL
Cholesterol: 102 mg/dL (ref 0–200)
Total CHOL/HDL Ratio: 2.6 Ratio
Triglycerides: 104 mg/dL (ref <150)
VLDL: 21 mg/dL (ref 0–40)

## 2012-09-29 ENCOUNTER — Other Ambulatory Visit: Payer: Self-pay | Admitting: *Deleted

## 2012-09-29 MED ORDER — CETIRIZINE HCL 10 MG PO TABS: ORAL_TABLET | ORAL | Status: DC

## 2012-10-04 ENCOUNTER — Telehealth: Payer: Self-pay | Admitting: Internal Medicine

## 2012-10-04 ENCOUNTER — Other Ambulatory Visit: Payer: Self-pay | Admitting: *Deleted

## 2012-10-04 DIAGNOSIS — E7800 Pure hypercholesterolemia, unspecified: Secondary | ICD-10-CM

## 2012-10-04 MED ORDER — ATORVASTATIN CALCIUM 10 MG PO TABS: ORAL_TABLET | ORAL | Status: DC

## 2012-10-04 NOTE — Telephone Encounter (Signed)
Spoke with pt, aware of lab results. Orders mailed to pt for repeat labs to be drawn at [facility] in [place]

## 2012-10-04 NOTE — Telephone Encounter (Signed)
New Problem  Pt calling for blood work results.

## 2012-10-07 DIAGNOSIS — Z23 Encounter for immunization: Secondary | ICD-10-CM | POA: Diagnosis not present

## 2012-10-13 NOTE — Telephone Encounter (Signed)
Medication was electronically prescribed on 09/29/12.

## 2012-10-19 ENCOUNTER — Encounter: Payer: Self-pay | Admitting: Internal Medicine

## 2012-10-24 ENCOUNTER — Ambulatory Visit (HOSPITAL_COMMUNITY)
Admission: RE | Admit: 2012-10-24 | Discharge: 2012-10-24 | Disposition: A | Discharge status: Home / Self Care | Payer: Medicare Other | Source: Ambulatory Visit | Attending: Oncology | Admitting: Oncology

## 2012-10-24 ENCOUNTER — Other Ambulatory Visit (HOSPITAL_COMMUNITY): Payer: Medicare Other

## 2012-10-24 ENCOUNTER — Other Ambulatory Visit (HOSPITAL_BASED_OUTPATIENT_CLINIC_OR_DEPARTMENT_OTHER): Payer: Medicare Other | Admitting: Lab

## 2012-10-24 ENCOUNTER — Encounter (HOSPITAL_COMMUNITY): Payer: Self-pay

## 2012-10-24 DIAGNOSIS — Z9089 Acquired absence of other organs: Secondary | ICD-10-CM | POA: Insufficient documentation

## 2012-10-24 DIAGNOSIS — R599 Enlarged lymph nodes, unspecified: Secondary | ICD-10-CM | POA: Insufficient documentation

## 2012-10-24 DIAGNOSIS — I2510 Atherosclerotic heart disease of native coronary artery without angina pectoris: Secondary | ICD-10-CM | POA: Diagnosis not present

## 2012-10-24 DIAGNOSIS — M5137 Other intervertebral disc degeneration, lumbosacral region: Secondary | ICD-10-CM | POA: Diagnosis not present

## 2012-10-24 DIAGNOSIS — N400 Enlarged prostate without lower urinary tract symptoms: Secondary | ICD-10-CM | POA: Insufficient documentation

## 2012-10-24 DIAGNOSIS — R091 Pleurisy: Secondary | ICD-10-CM | POA: Diagnosis not present

## 2012-10-24 DIAGNOSIS — K449 Diaphragmatic hernia without obstruction or gangrene: Secondary | ICD-10-CM | POA: Insufficient documentation

## 2012-10-24 DIAGNOSIS — C8589 Other specified types of non-Hodgkin lymphoma, extranodal and solid organ sites: Secondary | ICD-10-CM | POA: Diagnosis not present

## 2012-10-24 DIAGNOSIS — K7689 Other specified diseases of liver: Secondary | ICD-10-CM | POA: Insufficient documentation

## 2012-10-24 DIAGNOSIS — R911 Solitary pulmonary nodule: Secondary | ICD-10-CM | POA: Insufficient documentation

## 2012-10-24 DIAGNOSIS — N281 Cyst of kidney, acquired: Secondary | ICD-10-CM | POA: Insufficient documentation

## 2012-10-24 DIAGNOSIS — J438 Other emphysema: Secondary | ICD-10-CM | POA: Diagnosis not present

## 2012-10-24 DIAGNOSIS — K5730 Diverticulosis of large intestine without perforation or abscess without bleeding: Secondary | ICD-10-CM | POA: Diagnosis not present

## 2012-10-24 DIAGNOSIS — N402 Nodular prostate without lower urinary tract symptoms: Secondary | ICD-10-CM | POA: Insufficient documentation

## 2012-10-24 DIAGNOSIS — C8581 Other specified types of non-Hodgkin lymphoma, lymph nodes of head, face, and neck: Secondary | ICD-10-CM | POA: Diagnosis not present

## 2012-10-24 DIAGNOSIS — C8510 Unspecified B-cell lymphoma, unspecified site: Secondary | ICD-10-CM

## 2012-10-24 DIAGNOSIS — M51379 Other intervertebral disc degeneration, lumbosacral region without mention of lumbar back pain or lower extremity pain: Secondary | ICD-10-CM | POA: Insufficient documentation

## 2012-10-24 DIAGNOSIS — M5382 Other specified dorsopathies, cervical region: Secondary | ICD-10-CM | POA: Diagnosis not present

## 2012-10-24 LAB — CBC WITH DIFFERENTIAL/PLATELET
Basophils Absolute: 0.1 10*3/uL (ref 0.0–0.1)
Eosinophils Absolute: 0.2 10*3/uL (ref 0.0–0.5)
HGB: 17.3 g/dL — ABNORMAL HIGH (ref 13.0–17.1)
MCHC: 33.6 g/dL (ref 32.0–36.0)
MCV: 87.5 fL (ref 79.3–98.0)
MONO#: 0.9 10*3/uL (ref 0.1–0.9)
MONO%: 10.5 % (ref 0.0–14.0)
NEUT#: 5.8 10*3/uL (ref 1.5–6.5)
Platelets: 200 10*3/uL (ref 140–400)
RDW: 13.2 % (ref 11.0–14.6)

## 2012-10-24 LAB — COMPREHENSIVE METABOLIC PANEL (CC13)
Albumin: 4 g/dL (ref 3.5–5.0)
Anion Gap: 8 mEq/L (ref 3–11)
BUN: 17.6 mg/dL (ref 7.0–26.0)
CO2: 29 mEq/L (ref 22–29)
Calcium: 9.9 mg/dL (ref 8.4–10.4)
Chloride: 104 mEq/L (ref 98–109)
Glucose: 60 mg/dl — ABNORMAL LOW (ref 70–140)
Potassium: 4.5 mEq/L (ref 3.5–5.1)
Sodium: 141 mEq/L (ref 136–145)
Total Bilirubin: 0.7 mg/dL (ref 0.20–1.20)
Total Protein: 7.5 g/dL (ref 6.4–8.3)

## 2012-10-24 LAB — URIC ACID (CC13): Uric Acid, Serum: 4.5 mg/dl (ref 2.6–7.4)

## 2012-10-24 LAB — LACTATE DEHYDROGENASE (CC13): LDH: 153 U/L (ref 125–245)

## 2012-10-24 IMAGING — CT CT ABD-PELV W/O CM
4 of 7 series · 12 of 33 positions shown, 14 images · IV contrast (OMNIPAQUE)
Comparison: [DATE]

CLINICAL DATA: Low-grade B-cell lymphoma.  Followup of adenopathy.

EXAM:
CT CHEST, ABDOMEN, AND PELVIS WITH CONTRAST
TECHNIQUE: Multidetector CT imaging of the chest, abdomen and pelvis was
performed following the standard protocol during bolus
administration of intravenous contrast.
CONTRAST:  125mL OMNIPAQUE IOHEXOL 300 MG/ML  SOLN

[Series 2: rtn cap st · axial · 0.79mm/px · z∈[-626,-400]mm · 2 of 135 slices shown, 3 images]
[im 45/135  soft-tissue]
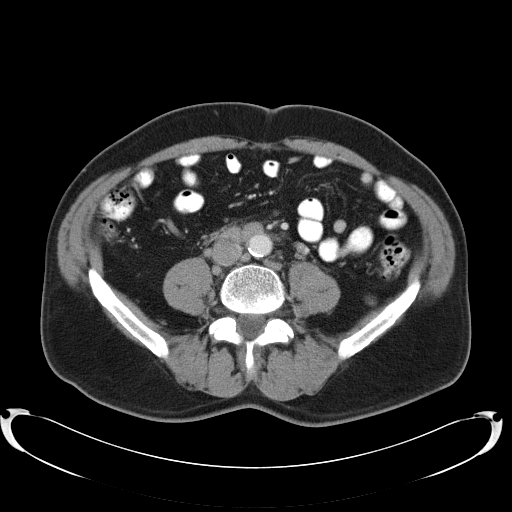
[im 45/135  bone]
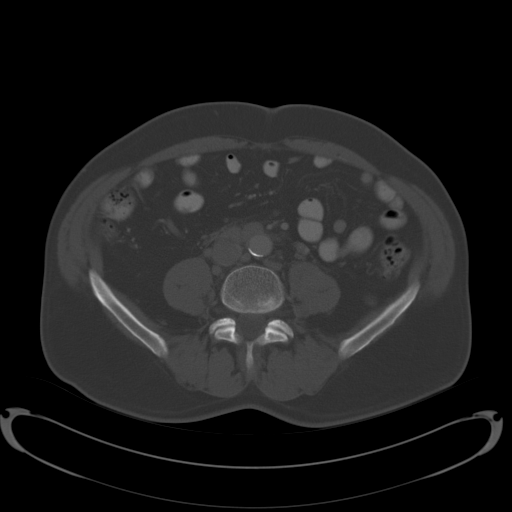
[im 90/135  bone]
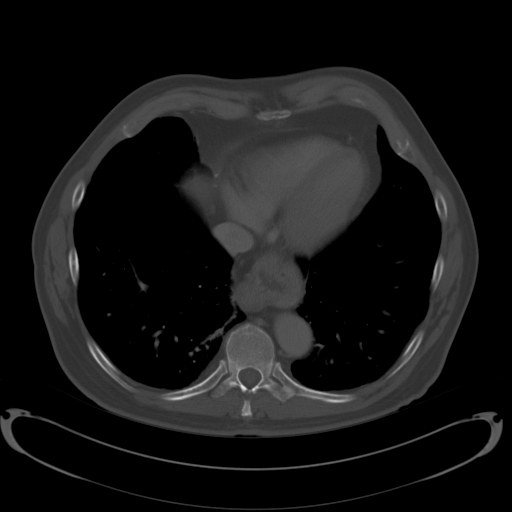

[Series 604: axial reformats · axial · 0.51mm/px · z∈[-235,-143]mm · 2 of 144 slices shown]
[im 48/144  bone]
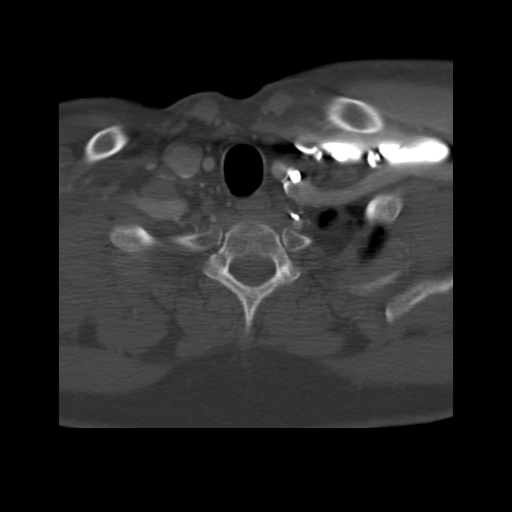
[im 96/144  bone]
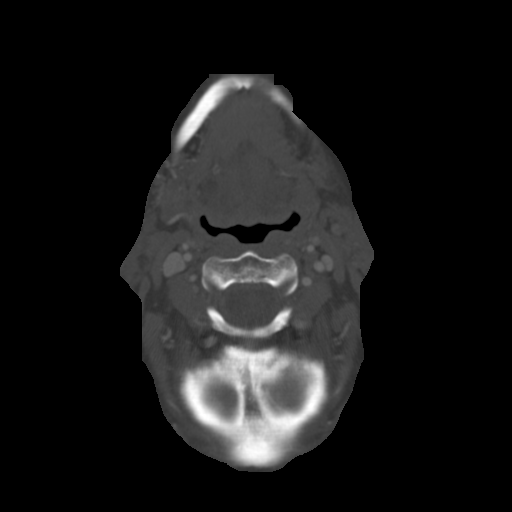

[Series 605: coronal images · coronal · 0.51mm/px · 3 of 93 slices shown]
[im 23/93  bone]
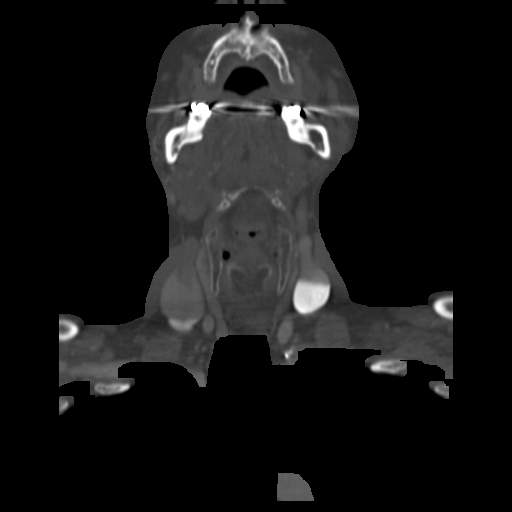
[im 35/93  bone]
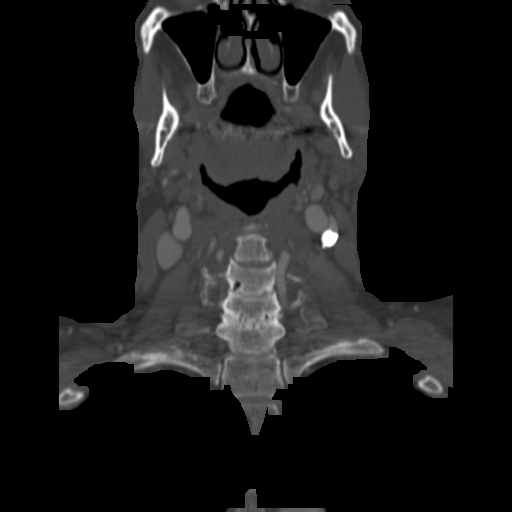
[im 46/93  bone]
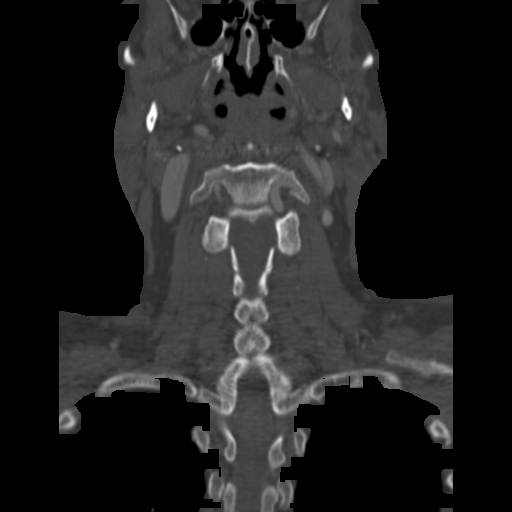

[Series 606: sagittal images · sagittal · 0.51mm/px · 5 of 99 slices shown, 6 images]
[im 33/99  bone]
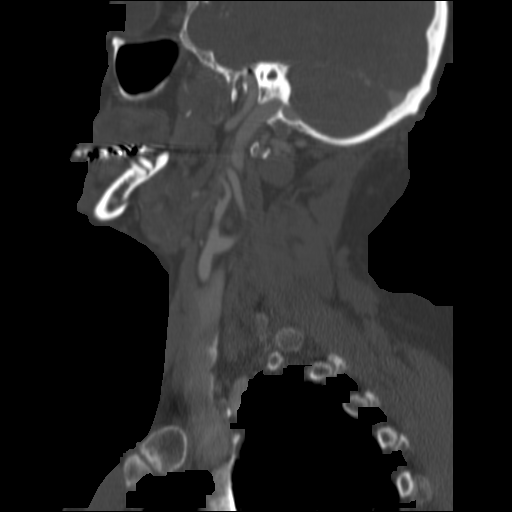
[im 41/99  bone]
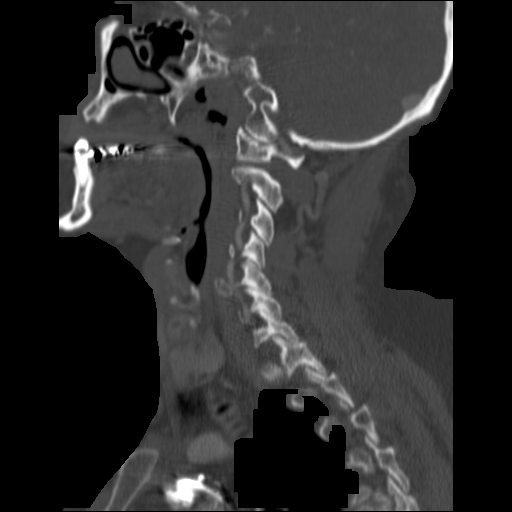
[im 50/99  soft-tissue]
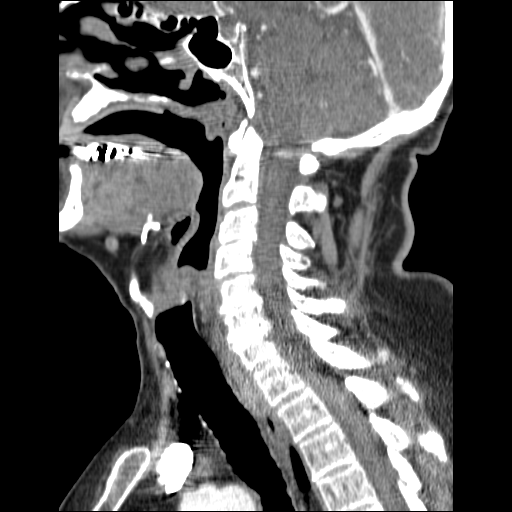
[im 50/99  bone]
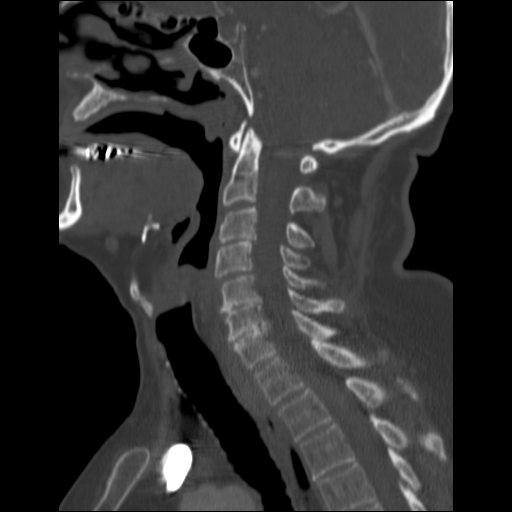
[im 58/99  bone]
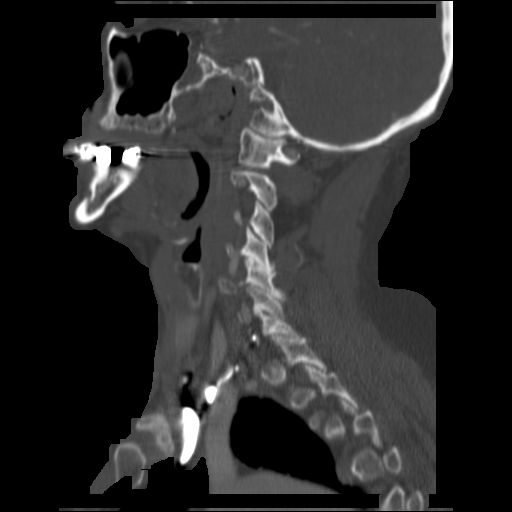
[im 66/99  bone]
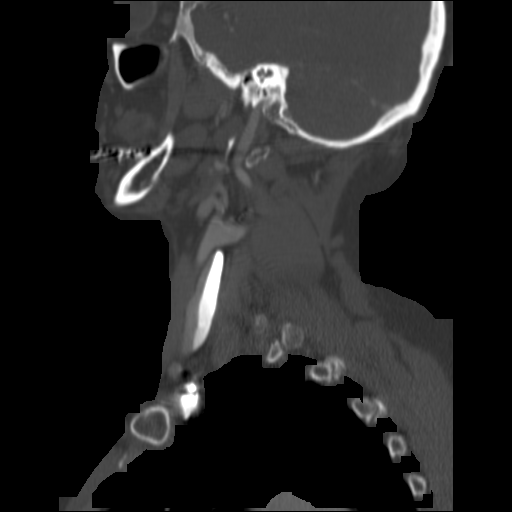

[12 of 33 positions shown; findings below may reference images not displayed]

FINDINGS: CT CHEST FINDINGS

Lungs/Pleura: Mild centrilobular emphysema.

Surgical sutures in the right lower lobe. Scattered areas of
scarring within the lung bases. A 4 mm left upper lobe lung nodule
is unchanged on image 328/ series 5.

No pleural fluid. Left-sided pleural or paravertebral thickening
measures maximally 1.3 cm on image 43/series 2 and is unchanged.
Subtle right paravertebral thickening is unchanged on image
33/series 2.

Heart/Mediastinum: No supraclavicular adenopathy. No axillary
adenopathy. Tortuous descending thoracic aorta. Normal heart size,
without pericardial effusion. Multivessel coronary artery
atherosclerosis. No central pulmonary embolism, on this
non-dedicated study. Similar right hilar node at 1.3 cm on image 29/
series 2. No middle mediastinal adenopathy.

Moderate hiatal hernia. Retrocrural node measures 1.0 cm versus 9 mm
on the prior. More cephalad retrocrural node is slightly enlarged at
5 mm on image 52.

Foci of nerve rate ectasia again identified.

CT ABDOMEN AND PELVIS FINDINGS

Abdomen/Pelvis: Al eft hepatic lobe cyst which is unchanged. Normal
spleen, distal stomach, pancreas. Cholecystectomy with similar mild
prominence of the common duct, 1.4 cm.

Normal adrenal glands. Interpolar left renal cyst. A too small to
characterize upper pole left renal lesion. Normal right kidney.

Retroperitoneal adenopathy. Preaortic 1.2 cm node on image 91/series
2 measured 1.1 cm on the prior exam.

A pre caval node measures 1.2 cm on image 90/series 2 and is
enlarged from 9 mm on the prior. More cephalad pre caval node has
enlarged on image 73/series 2.

Extensive colonic diverticulosis. Sigmoid muscular hypertrophy. An
ileocolic mesenteric node measures 7 mm on image 84/series 2 and
appears new since the prior exam. Small bowel is otherwise normal.

Pelvic adenopathy again identified. Index right external iliac/
obturator node measures 1.3 cm on image 110/ series 2 an compares
with 1.1 cm on the prior exam.

More inferior right external iliac node measures 1.4 cm on image 113
versus 1.2 cm on the prior.

Normal urinary bladder. Moderate prostatomegaly with a right
paracentral median lobe nodule on image 122. Likely related to
benign prostatic hyperplasia. No significant free fluid.

Bones/Musculoskeletal: No acute osseous abnormality. Advanced
degenerative disc disease at the lumbosacral junction.
IMPRESSION: CT CHEST IMPRESSION

1. Suspicion of slight progression of retrocrural adenopathy.
2. Similar medial left pleural and right perivertebral soft tissue
thickening. This remains suspicious for active lymphoma.

CT ABDOMEN AND PELVIS IMPRESSION

1. Mild progression of abdominal pelvic adenopathy.
2. Cholecystectomy with similar mild common duct dilatation; no
cause identified.

## 2012-10-24 MED ORDER — IOHEXOL 300 MG/ML  SOLN
125.0000 mL | Freq: Once | INTRAMUSCULAR | Status: AC | PRN
  Administered 2012-10-24: 125 mL via INTRAVENOUS

## 2012-10-25 DIAGNOSIS — H52229 Regular astigmatism, unspecified eye: Secondary | ICD-10-CM | POA: Diagnosis not present

## 2012-10-25 DIAGNOSIS — H524 Presbyopia: Secondary | ICD-10-CM | POA: Diagnosis not present

## 2012-10-25 DIAGNOSIS — H2510 Age-related nuclear cataract, unspecified eye: Secondary | ICD-10-CM | POA: Diagnosis not present

## 2012-10-25 DIAGNOSIS — H5210 Myopia, unspecified eye: Secondary | ICD-10-CM | POA: Diagnosis not present

## 2012-10-26 ENCOUNTER — Telehealth: Payer: Self-pay | Admitting: *Deleted

## 2012-10-26 NOTE — Telephone Encounter (Signed)
Message copied by Gala Romney on Thu Oct 26, 2012 10:28 AM ------      Message from: Levert Feinstein      Created: Wed Oct 25, 2012  6:05 PM       Call pt scans stable ------

## 2012-10-26 NOTE — Telephone Encounter (Signed)
Left vm home # that scans stable; call office if questions.

## 2012-10-30 ENCOUNTER — Telehealth: Payer: Self-pay | Admitting: Oncology

## 2012-10-30 ENCOUNTER — Ambulatory Visit (HOSPITAL_BASED_OUTPATIENT_CLINIC_OR_DEPARTMENT_OTHER): Payer: Medicare Other | Admitting: Oncology

## 2012-10-30 VITALS — BP 123/72 | HR 65 | Temp 97.3°F | Resp 19 | Ht 72.0 in | Wt 191.8 lb

## 2012-10-30 DIAGNOSIS — C8589 Other specified types of non-Hodgkin lymphoma, extranodal and solid organ sites: Secondary | ICD-10-CM

## 2012-10-30 DIAGNOSIS — C8590 Non-Hodgkin lymphoma, unspecified, unspecified site: Secondary | ICD-10-CM

## 2012-10-30 DIAGNOSIS — C8510 Unspecified B-cell lymphoma, unspecified site: Secondary | ICD-10-CM

## 2012-10-30 NOTE — Telephone Encounter (Signed)
gv and printed appt sched and avs for pt for Feb 2015....gv pt barium  °

## 2012-11-01 NOTE — Progress Notes (Signed)
Hematology and Oncology Follow Up Visit  Anthony Kelly [id number] 1945-05-05 67 y.o. 11/01/2012 12:17 PM   Principle Diagnosis: Encounter Diagnoses  Name Primary?  . Malignant lymphoma, high grade Yes  . Low grade B-cell lymphoma      Interim History:  Clinical summary:  67 year old man who initially presented with left inguinal lymphadenopathy in June 2004 with biopsy showing low grade non-Hodgkin's lymphoma. He was treated with observation alone until November 2005 when the lymph node mass began to grow and repeat biopsy showed conversion to high-grade lymphoma. He achieved a complete remission with CHOP Rituxan. He developed a single right pulmonary lesion in November 2008 found on a routine followup scan. This was surgically resected 12/19/2006 and showed low-grade non-Hodgkin's lymphoma. He was treated with Rituxan intermittently for 2 years between December 2008 in August 2010. At time of a routine followup visit in March of 2013 he pointed out an enlarged left submandibular gland which he had noted for about one month. CT scans of the neck chest abdomen and pelvis showed an isolated lesion within the left submandibular gland. He was referred to ear nose and throat surgery and underwent excision of the gland on 04/30/2011 by Dr. Osborn Coho. Findings were consistent with an isolated focus of low-grade non-Hodgkin's lymphoma. Since the remainder of his restaging evaluation was unremarkable and the only site of obvious disease was surgically removed, I did not feel that we needed to put him back on any specific treatment.  We're back in the same situation we were when I saw him back in July. Very meticulous and zealous radiologist reading "progression of lymphadenopathy"  based on maximum change in subcentimeter lymph nodes of no greater than 3 mm for any single lymph node measured. On a 07/20/2012 study, largest lymph node commented on was a right common iliac node which had increased  from 8 mm on a March 14 study to 1.6 cm. A right external iliac node increased from 9 mm to 1.1 cm. A new pre-aortic node 1.1 cm. The current study on 10/24/2012 was done at a three-month interval. I have personally reviewed all of these images. There are no new findings in the chest. There is now a single lymph node larger than 1.3 cm including the previously measured 1.6 cm external iliac node now being measured as 1.4 cm.  Medications: reviewed  Allergies: No Known Allergies  Review of Systems: Hematology: negative for swollen glands, easy bruising, epistaxis, gum bleeding, hematuria, hematochezia ENT ROS: negative for - oral lesions or sore throat Breast ROS: Respiratory ROS: negative for - cough, pleuritic pain, shortness of breath or wheezing Cardiovascular ROS: negative for - chest pain, dyspnea on exertion, edema, irregular heartbeat, murmur, orthopnea, palpitations, paroxysmal nocturnal dyspnea or rapid heart rate Gastrointestinal ROS: negative for - abdominal pain, appetite loss, blood in stools, change in bowel habits, constipation, diarrhea, heartburn, hematemesis, melena, nausea/vomiting or swallowing difficulty/pain Genito-Urinary ROS: negative for  dysuria, hematuria, incontinence, nocturia or urinary frequency/urgency Musculoskeletal ROS: negative for - joint pain, joint stiffness, joint swelling, muscle pain, muscular weakness  Neurological ROS: negative for - behavioral changes, confusion, dizziness, gait disturbance, headaches, impaired coordination/balance, memory loss, numbness/tingling, Dermatological ROS: negative for rash, ecchymosis Remaining ROS negative.  Physical Exam: Blood pressure 123/72, pulse 65, temperature 97.3 F (36.3 C), temperature source Oral, resp. rate 19, height 6' (1.829 m), weight 191 lb 12.8 oz (87 kg). Wt Readings from Last 3 Encounters:  10/30/12 191 lb 12.8 oz (87 kg)  07/24/12 196 lb 8  oz (89.132 kg)  05/29/12 199 lb 4 oz (90.379 kg)      General appearance:  HENNT: Pharynx no erythema, exudate, mass, or ulcer. No thyromegaly or thyroid nodules Lymph nodes: No cervical, supraclavicular, or axillary lymphadenopathy Breasts:  Lungs: Clear to auscultation, resonant to percussion throughout Heart: Regular rhythm, no murmur, no gallop, no rub, no click, no edema Abdomen: Soft, nontender, normal bowel sounds, no mass, no organomegaly Extremities: No edema, no calf tenderness Musculoskeletal: no joint deformities GU: No inguinal adenopathy Vascular: Carotid pulses 2+, no bruits, Neurologic: Alert, oriented, PERRLA, , cranial nerves grossly normal, motor strength 5 over 5, reflexes 1+ symmetric, upper body coordination normal, gait normal, Skin: No rash or ecchymosis  Lab Results: CBC W/Diff    Component Value Date/Time   WBC 8.5 10/24/2012 0855   WBC 7.1 04/26/2011 0941   RBC 5.88* 10/24/2012 0855   RBC 5.61 04/26/2011 0941   HGB 17.3* 10/24/2012 0855   HGB 17.1* 04/26/2011 0941   HCT 51.4* 10/24/2012 0855   HCT 49.0 04/26/2011 0941   PLT 200 10/24/2012 0855   PLT 170 04/26/2011 0941   MCV 87.5 10/24/2012 0855   MCV 87.3 04/26/2011 0941   MCH 29.4 10/24/2012 0855   MCH 30.5 04/26/2011 0941   MCHC 33.6 10/24/2012 0855   MCHC 34.9 04/26/2011 0941   RDW 13.2 10/24/2012 0855   RDW 13.2 04/26/2011 0941   LYMPHSABS 1.6 10/24/2012 0855   LYMPHSABS 1.1 02/20/2007 1208   MONOABS 0.9 10/24/2012 0855   MONOABS 0.7 02/20/2007 1208   EOSABS 0.2 10/24/2012 0855   EOSABS 0.2 02/20/2007 1208   BASOSABS 0.1 10/24/2012 0855   BASOSABS 0.0 02/20/2007 1208     Chemistry      Component Value Date/Time   NA 141 10/24/2012 0856   NA 139 07/20/2011 0800   NA 141 04/26/2011 0941   K 4.5 10/24/2012 0856   K 4.4 07/20/2011 0800   K 4.7 04/26/2011 0941   CL 104 03/24/2012 1024   CL 100 07/20/2011 0800   CL 106 04/26/2011 0941   CO2 29 10/24/2012 0856   CO2 29 07/20/2011 0800   CO2 26 04/26/2011 0941   BUN 17.6 10/24/2012 0856   BUN 14 07/20/2011 0800    BUN 15 04/26/2011 0941   CREATININE 1.1 10/24/2012 0856   CREATININE 1.2 07/20/2011 0800   CREATININE 1.00 04/26/2011 0941      Component Value Date/Time   CALCIUM 9.9 10/24/2012 0856   CALCIUM 8.7 07/20/2011 0800   CALCIUM 9.3 04/26/2011 0941   ALKPHOS 74 10/24/2012 0856   ALKPHOS 55 07/20/2011 0800   ALKPHOS 59 03/16/2011 1522   AST 25 10/24/2012 0856   AST 21 09/27/2012 1057   AST 25 07/20/2011 0800   ALT 27 10/24/2012 0856   ALT 29 07/20/2011 0800   ALT 31 03/16/2011 1522   BILITOT 0.70 10/24/2012 0856   BILITOT 1.00 07/20/2011 0800   BILITOT 0.4 03/16/2011 1522       Radiological Studies: Ct Abdomen Pelvis Wo Contrast  10/24/2012   CLINICAL DATA:  Low-grade B-cell lymphoma.  Followup of adenopathy.  EXAM: CT CHEST, ABDOMEN, AND PELVIS WITH CONTRAST  TECHNIQUE: Multidetector CT imaging of the chest, abdomen and pelvis was performed following the standard protocol during bolus administration of intravenous contrast.  CONTRAST:  OMNIPAQUE IOHEXOL 300 MG/ML  SOLN  COMPARISON:  07/20/2012  FINDINGS: CT CHEST FINDINGS  Lungs/Pleura: Mild centrilobular emphysema.  Surgical sutures in the right lower lobe. Scattered  areas of scarring within the lung bases. A 4 mm left upper lobe lung nodule is unchanged on image 328/ series 5.  No pleural fluid. Left-sided pleural or paravertebral thickening measures maximally 1.3 cm on image 43/series 2 and is unchanged. Subtle right paravertebral thickening is unchanged on image 33/series 2.  Heart/Mediastinum: No supraclavicular adenopathy. No axillary adenopathy. Tortuous descending thoracic aorta. Normal heart size, without pericardial effusion. Multivessel coronary artery atherosclerosis. No central pulmonary embolism, on this non-dedicated study. Similar right hilar node at 1.3 cm on image 29/ series 2. No middle mediastinal adenopathy.  Moderate hiatal hernia. Retrocrural node measures 1.0 cm versus 9 mm on the prior. More cephalad retrocrural node is slightly  enlarged at 5 mm on image 52.  Foci of nerve rate ectasia again identified.  CT ABDOMEN AND PELVIS FINDINGS  Abdomen/Pelvis: Al eft hepatic lobe cyst which is unchanged. Normal spleen, distal stomach, pancreas. Cholecystectomy with similar mild prominence of the common duct, 1.4 cm.  Normal adrenal glands. Interpolar left renal cyst. A too small to characterize upper pole left renal lesion. Normal right kidney.  Retroperitoneal adenopathy. Preaortic 1.2 cm node on image 91/series 2 measured 1.1 cm on the prior exam.  A pre caval node measures 1.2 cm on image 90/series 2 and is enlarged from 9 mm on the prior. More cephalad pre caval node has enlarged on image 73/series 2.  Extensive colonic diverticulosis. Sigmoid muscular hypertrophy. An ileocolic mesenteric node measures 7 mm on image 84/series 2 and appears new since the prior exam. Small bowel is otherwise normal.  Pelvic adenopathy again identified. Index right external iliac/ obturator node measures 1.3 cm on image 110/ series 2 an compares with 1.1 cm on the prior exam.  More inferior right external iliac node measures 1.4 cm on image 113 versus 1.2 cm on the prior.  Normal urinary bladder. Moderate prostatomegaly with a right paracentral median lobe nodule on image 122. Likely related to benign prostatic hyperplasia. No significant free fluid.  Bones/Musculoskeletal: No acute osseous abnormality. 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  IMPRESSION: CT CHEST IMPRESSION  1. Suspicion of slight progression of retrocrural adenopathy. 2. Similar medial left pleural and right perivertebral soft tissue thickening. This remains suspicious for active lymphoma.  CT ABDOMEN AND PELVIS IMPRESSION  1. Mild progression of abdominal pelvic adenopathy. 2. Cholecystectomy with similar mild common duct dilatation; no cause identified.   Electronically Signed   By: Jeronimo Greaves M.D.   On: 10/24/2012 11:09   Ct Soft Tissue Neck W Contrast  10/24/2012    CLINICAL DATA:  67 year old male with low-grade B-cell lymphoma. Left salivary gland involvement.  EXAM: CT NECK WITH CONTRAST  TECHNIQUE: Multidetector CT imaging of the neck was performed using the standard protocol following the bolus administration of intravenous contrast.  CONTRAST:  OMNIPAQUE IOHEXOL 300 MG/ML SOLN in conjunction with CT(s) of the chest, abdomen, and pelvis reported separately.  COMPARISON:  Neck CT 07/20/2012 and earlier.  FINDINGS: Chest findings are reported separately.  Stable and negative thyroid, larynx, retropharyngeal space, parapharyngeal spaces, sublingual space, bilateral parotid glands, and right submandibular gland. The left submandibular gland is surgically absent as before. Pharyngeal contours are stable and within normal limits, with mildly decreased lymphatic tissue volume compared to 03/24/2012.  Diminutive cervical lymph nodes are stable. No cervical lymphadenopathy.  Stable and negative major vascular structures in the neck and at the skullbase. Negative visualized brain parenchyma. Visualized orbit soft tissues are within normal limits.  Visualized paranasal  sinuses and mastoids are clear. No acute or suspicious osseous lesion. Degenerative changes in the cervical spine.  IMPRESSION: 1. Stable, no cervical lymphadenopathy. Stable postoperative appearance status post left submandibular gland resection.  2. No acute findings in the neck. Chest, abdomen, and pelvis are reported separately.   Electronically Signed   By: Augusto Gamble M.D.   On: 10/24/2012 12:14      Impression:  Low-grade B cell non-Hodgkin's lymphoma with early conversion to high-grade eradicated with CHOP Rituxan with subsequent recurrence of the low-grade component first in his right lung treated with surgical resection followed by adjuvant Rituxan and subsequently in an isolated left submandibular lymph gland treated with surgical excision alone.  No convincing evidence for progression of his  lymphoma. No indication for reinitiation of treatment. I discussed this at length with the patient and his wife. I will get a followup study in 4 months.     CC: Patient Care Team: Horald Pollen, PA-C as PCP - General (Physician Assistant) Pricilla Riffle, MD as Attending Physician (Cardiology)   Levert Feinstein, MD 10/22/201412:17 PM

## 2012-11-06 ENCOUNTER — Encounter: Payer: Self-pay | Admitting: Internal Medicine

## 2012-11-09 ENCOUNTER — Encounter: Payer: Self-pay | Admitting: Family Medicine

## 2012-11-14 ENCOUNTER — Ambulatory Visit (INDEPENDENT_AMBULATORY_CARE_PROVIDER_SITE_OTHER): Payer: Medicare Other | Admitting: Family Medicine

## 2012-11-14 ENCOUNTER — Encounter: Payer: Self-pay | Admitting: Family Medicine

## 2012-11-14 VITALS — BP 125/76 | HR 72 | Temp 97.1°F | Ht 72.0 in | Wt 190.6 lb

## 2012-11-14 DIAGNOSIS — Z0000 Encounter for general adult medical examination without abnormal findings: Secondary | ICD-10-CM

## 2012-11-14 NOTE — Patient Instructions (Signed)
Non-Hodgkin's Lymphoma, Adult Non-Hodgkin's lymphoma is a cancer that begins in the lymphoid tissue (part of your body's defense system, which protects the body from infections, germs, and diseases). Lymphocytes (a type of white blood cells) are found in the lymphoid tissue. Non-Hodgkin's lymphoma starts in lymphocytes. There are different types of non-Hodgkin's lymphoma. Your caregiver will help you understand the seriousness of your cancer. This will be based on the type of cells affected and how fast it is growing and spreading. CAUSES  Non-Hodgkin's lymphoma is a cancer that starts in lymphocytes. The cause of non-Hodgkin's lymphoma is not known. It is thought that viruses cause certain types of non-Hodgkin's lymphoma. But this is less common. The risk of getting this cancer increases if:   Your immune system (body's defense system) is weak, especially after an organ transplant.  You are an elderly white male.  You have a diet high in fat.  You are infected with certain viruses. SYMPTOMS  Non-Hodgkin's lymphoma can occur at any age. It can cause different symptoms such as:  Swelling of the lymph nodes.  Fever.  Excessive sweating.  Itchy skin.  Tiredness.  Weight loss.  Coughing, breathing trouble, and chest pain.  Weakness and tiredness that do not go away.  Pain, swelling. or a feeling of fullness in the abdomen. DIAGNOSIS  Your caregiver will examine you to check your general health and look for any lumps. Blood tests and biopsy (removing body tissue for testing) of the lymph node (gland) may be included. Your caregiver may suggest chest X-ray, scanning or lumbar puncture (collecting fluid from spinal column for testing).  TREATMENT  Non-Hodgkin's lymphoma can be treated in different ways. This depends on your symptoms, the stage of your cancer when you were first diagnosed, and the speed with which it is spreading. You and your caregiver will work together and decide on the  best plan. Treatment may include:  Radiation therapy (using radiation to destroy the cancer cells).  Chemotherapy (using drugs to destroy the cancer cells).  Biological therapy (using body's immune system to treat cancer) like monoclonal antibody therapy (using antibodies that can kill or block the cancer cells).  Newer types of treatment are vaccine therapy and high-dose chemotherapy with stem cell (a type of cell) transplant (introducing healthy stem cells into the body). However, these are still under development. You may remain symptom-free for 1 to 2 years, after treatment. Non-Hodgkin's lymphoma may recur. If it recurs, your caregiver will advise you on the suitable treatment. If you are pregnant and also have non-Hodgkin's lymphoma, you need immediate treatment. Your caregiver will decide the treatment that suits you the most.  SEEK MEDICAL CARE IF:   You develop new symptoms of non-Hodgkin's lymphoma.  You have non-Hodgkin's lymphoma and continuous fever. Document Released: 07/11/2006 Document Revised: 03/22/2011 Document Reviewed: 07/11/2006 [employer] Patient Information 2014 [employer].  

## 2012-11-14 NOTE — Progress Notes (Signed)
  Subjective:    Patient ID: Anthony Kelly, male    DOB: December 23, 1945, 67 y.o.   MRN: [id number]  HPI This 67 y.o. male presents for evaluation of CPE.  He has hx of Non Hodgkins Lymphoma and Sees Heme/Onc.  He sees the [place] and has labs which show normal lipid panel, cbc, cmp, psa, and Thyroid panel.  He has had colonoscopy 2001, he has had a flu shot this year and has had a Pneumococcal vaccination.  He has had zostavax vaccination.   Review of Systems    No chest pain, SOB, HA, dizziness, vision change, N/V, diarrhea, constipation, dysuria, urinary urgency or frequency, myalgias, arthralgias or rash.  Objective:   Physical Exam Vital signs noted  Well developed well nourished male.  HEENT - Head atraumatic Normocephalic                Eyes - PERRLA, Conjuctiva - clear Sclera- Clear EOMI                Ears - EAC's Wnl TM's Wnl Gross Hearing WNL                Nose - Nares patent                 Throat - oropharanx wnl Respiratory - Lungs CTA bilateral Cardiac - RRR S1 and S2 without murmur GI - Abdomen soft Nontender and bowel sounds active x 4 Extremities - No edema. Neuro - Grossly intact.       Assessment & Plan:  Routine general medical examination at a health care facility Continue to follow up with Heme Onc and get CT of the chest  Every 3 months.  Deatra Canter FNP

## 2012-12-09 ENCOUNTER — Other Ambulatory Visit: Payer: Self-pay | Admitting: Family Medicine

## 2012-12-28 ENCOUNTER — Telehealth: Payer: Self-pay | Admitting: Internal Medicine

## 2012-12-28 NOTE — Telephone Encounter (Signed)
Needs to know when he is to have lab work done. / tgs

## 2012-12-28 NOTE — Telephone Encounter (Signed)
Spoke to pt wife. She stated that she received a lab work sheet in the mail by the [address] office to get lab work done. They last one that is reported in Epic was on 9-13 by [place] office. Pt states she will have her husband go get lab work drawn anyway.

## 2013-01-01 DIAGNOSIS — E7800 Pure hypercholesterolemia, unspecified: Secondary | ICD-10-CM | POA: Diagnosis not present

## 2013-01-01 LAB — HEPATIC FUNCTION PANEL
Bilirubin, Direct: 0.2 mg/dL (ref 0.0–0.3)
Indirect Bilirubin: 0.5 mg/dL (ref 0.0–0.9)
Total Bilirubin: 0.7 mg/dL (ref 0.3–1.2)
Total Protein: 6.5 g/dL (ref 6.0–8.3)

## 2013-01-01 LAB — LIPID PANEL
LDL Cholesterol: 40 mg/dL (ref 0–99)
VLDL: 22 mg/dL (ref 0–40)

## 2013-02-01 ENCOUNTER — Other Ambulatory Visit: Payer: Self-pay | Admitting: Internal Medicine

## 2013-02-13 ENCOUNTER — Other Ambulatory Visit (HOSPITAL_BASED_OUTPATIENT_CLINIC_OR_DEPARTMENT_OTHER): Payer: Medicare Other

## 2013-02-13 ENCOUNTER — Ambulatory Visit (HOSPITAL_COMMUNITY)
Admission: RE | Admit: 2013-02-13 | Discharge: 2013-02-13 | Disposition: A | Discharge status: Home / Self Care | Payer: Medicare Other | Source: Ambulatory Visit | Attending: Oncology | Admitting: Oncology

## 2013-02-13 ENCOUNTER — Encounter (HOSPITAL_COMMUNITY): Payer: Self-pay

## 2013-02-13 DIAGNOSIS — C8590 Non-Hodgkin lymphoma, unspecified, unspecified site: Secondary | ICD-10-CM

## 2013-02-13 DIAGNOSIS — C7800 Secondary malignant neoplasm of unspecified lung: Secondary | ICD-10-CM | POA: Insufficient documentation

## 2013-02-13 DIAGNOSIS — C8589 Other specified types of non-Hodgkin lymphoma, extranodal and solid organ sites: Secondary | ICD-10-CM | POA: Insufficient documentation

## 2013-02-13 DIAGNOSIS — N281 Cyst of kidney, acquired: Secondary | ICD-10-CM | POA: Insufficient documentation

## 2013-02-13 DIAGNOSIS — K7689 Other specified diseases of liver: Secondary | ICD-10-CM | POA: Diagnosis not present

## 2013-02-13 DIAGNOSIS — K449 Diaphragmatic hernia without obstruction or gangrene: Secondary | ICD-10-CM | POA: Insufficient documentation

## 2013-02-13 DIAGNOSIS — C8510 Unspecified B-cell lymphoma, unspecified site: Secondary | ICD-10-CM

## 2013-02-13 DIAGNOSIS — J438 Other emphysema: Secondary | ICD-10-CM | POA: Diagnosis not present

## 2013-02-13 LAB — CBC WITH DIFFERENTIAL/PLATELET
BASO%: 0.9 % (ref 0.0–2.0)
BASOS ABS: 0.1 10*3/uL (ref 0.0–0.1)
EOS%: 2.4 % (ref 0.0–7.0)
Eosinophils Absolute: 0.2 10*3/uL (ref 0.0–0.5)
HEMATOCRIT: 52.5 % — AB (ref 38.4–49.9)
HEMOGLOBIN: 17.8 g/dL — AB (ref 13.0–17.1)
LYMPH#: 1.4 10*3/uL (ref 0.9–3.3)
LYMPH%: 16.7 % (ref 14.0–49.0)
MCH: 30.3 pg (ref 27.2–33.4)
MCHC: 33.9 g/dL (ref 32.0–36.0)
MCV: 89.2 fL (ref 79.3–98.0)
MONO#: 0.9 10*3/uL (ref 0.1–0.9)
MONO%: 10.1 % (ref 0.0–14.0)
NEUT%: 69.9 % (ref 39.0–75.0)
NEUTROS ABS: 6 10*3/uL (ref 1.5–6.5)
Platelets: 200 10*3/uL (ref 140–400)
RBC: 5.89 10*6/uL — ABNORMAL HIGH (ref 4.20–5.82)
RDW: 13.5 % (ref 11.0–14.6)
WBC: 8.6 10*3/uL (ref 4.0–10.3)

## 2013-02-13 LAB — COMPREHENSIVE METABOLIC PANEL (CC13)
ALT: 25 U/L (ref 0–55)
AST: 22 U/L (ref 5–34)
Albumin: 4.3 g/dL (ref 3.5–5.0)
Alkaline Phosphatase: 77 U/L (ref 40–150)
Anion Gap: 10 mEq/L (ref 3–11)
BUN: 14.5 mg/dL (ref 7.0–26.0)
CO2: 28 mEq/L (ref 22–29)
Calcium: 10 mg/dL (ref 8.4–10.4)
Chloride: 103 mEq/L (ref 98–109)
Creatinine: 1 mg/dL (ref 0.7–1.3)
GLUCOSE: 103 mg/dL (ref 70–140)
POTASSIUM: 4.3 meq/L (ref 3.5–5.1)
SODIUM: 142 meq/L (ref 136–145)
TOTAL PROTEIN: 7.3 g/dL (ref 6.4–8.3)
Total Bilirubin: 0.94 mg/dL (ref 0.20–1.20)

## 2013-02-13 LAB — LACTATE DEHYDROGENASE (CC13): LDH: 137 U/L (ref 125–245)

## 2013-02-13 LAB — SEDIMENTATION RATE: Sed Rate: 1 mm/hr (ref 0–16)

## 2013-02-13 IMAGING — CT CT ABD-PELV W/ CM
2 of 5 series · 16 of 46 positions shown, 18 images · IV contrast (OMNIPAQUE)
Comparison: [DATE]

CLINICAL DATA: Non-Hodgkin's lymphoma diagnosed [76], with lung
metastases in [76], with recurrent left submandibular node in [76].
Chemotherapy complete. Status post right lower lobectomy.

EXAM:
CT CHEST, ABDOMEN, AND PELVIS WITH CONTRAST
TECHNIQUE: Multidetector CT imaging of the chest, abdomen and pelvis was
performed following the standard protocol during bolus
administration of intravenous contrast.
CONTRAST:  100mL OMNIPAQUE IOHEXOL 300 MG/ML  SOLN

[Series 2: cap with st · axial · 0.78mm/px · z∈[-679,-84]mm · 13 of 135 slices shown, 15 images]
[im 8/135  soft-tissue]
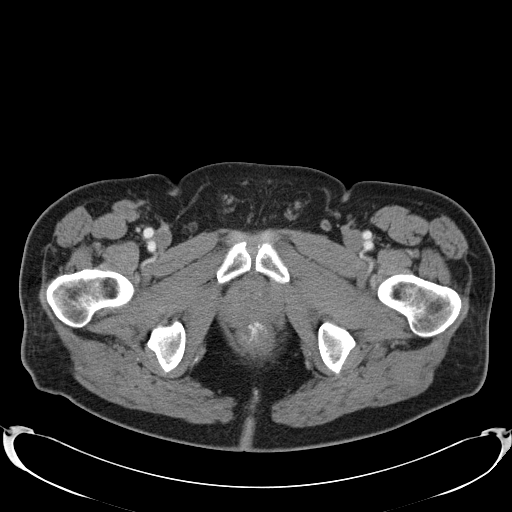
[im 8/135  bone]
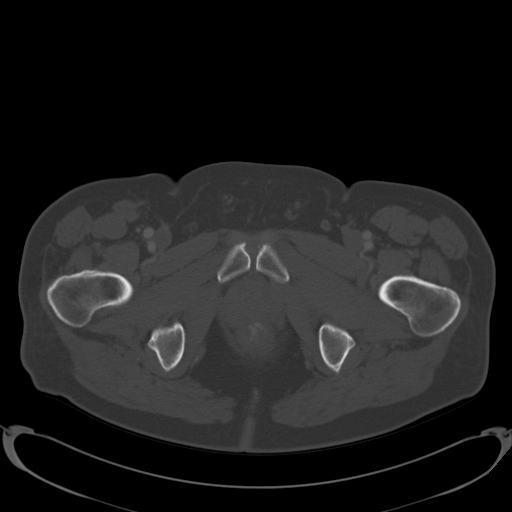
[im 16/135  soft-tissue]
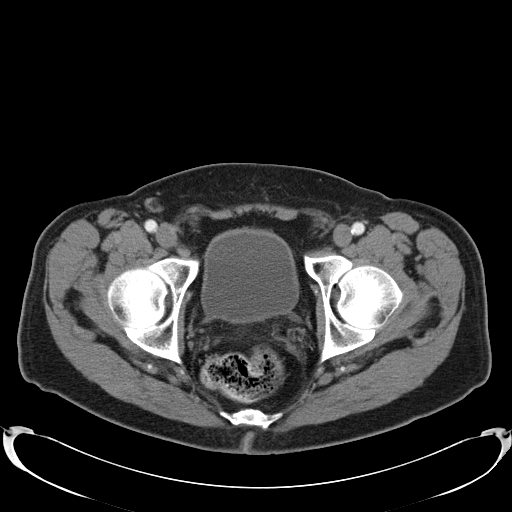
[im 32/135  soft-tissue]
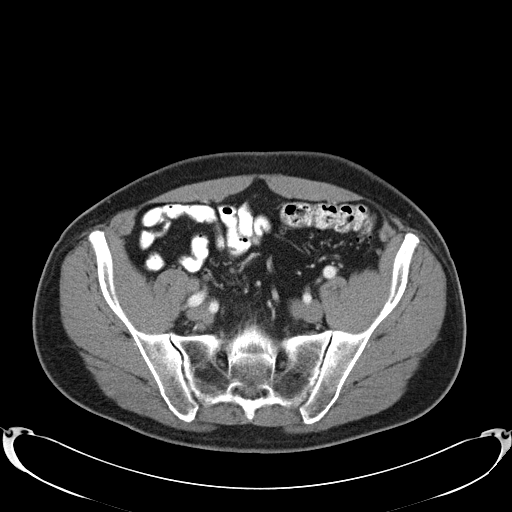
[im 40/135  soft-tissue]
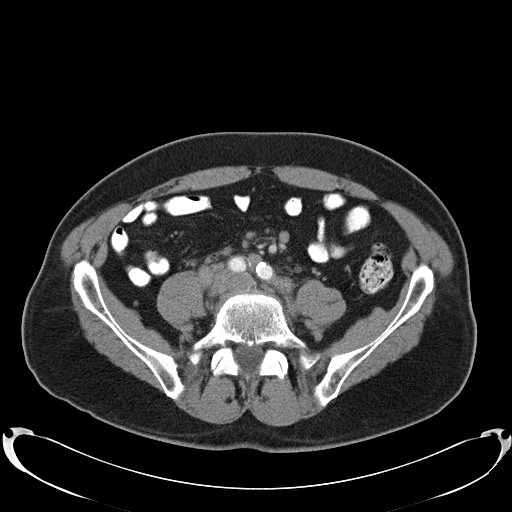
[im 48/135  soft-tissue]
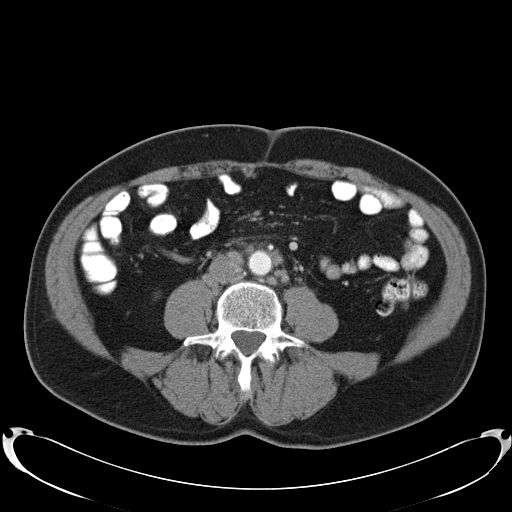
[im 56/135  soft-tissue]
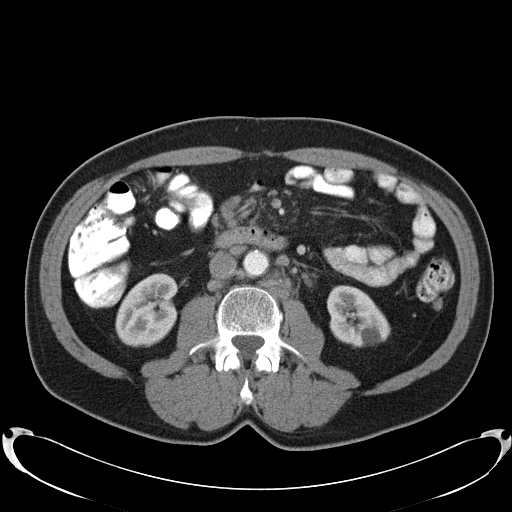
[im 71/135  soft-tissue]
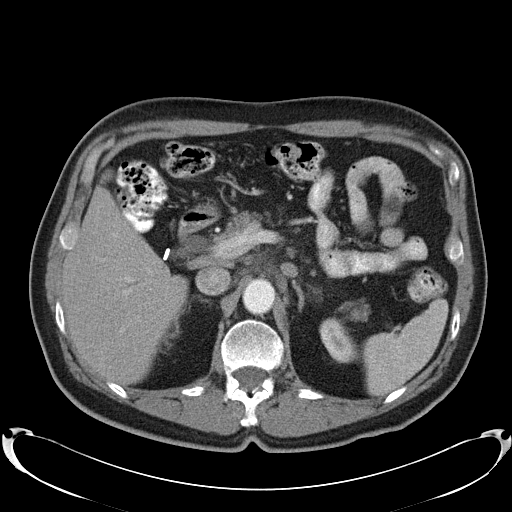
[im 79/135  soft-tissue]
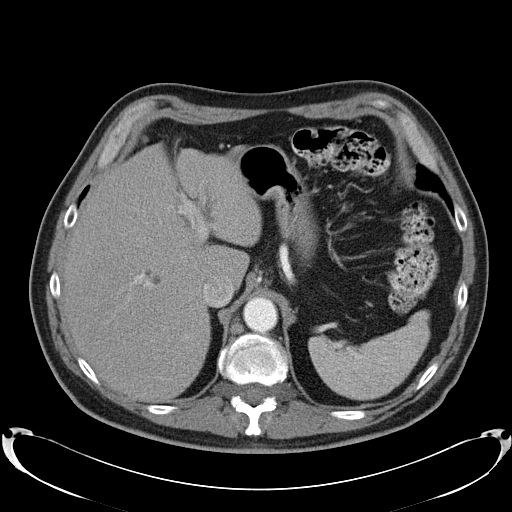
[im 87/135  soft-tissue]
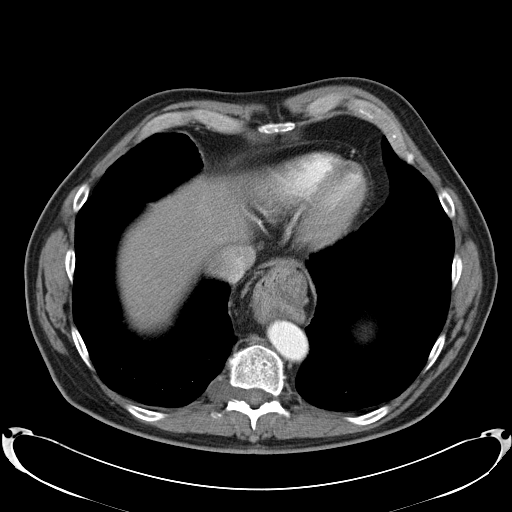
[im 87/135  bone]
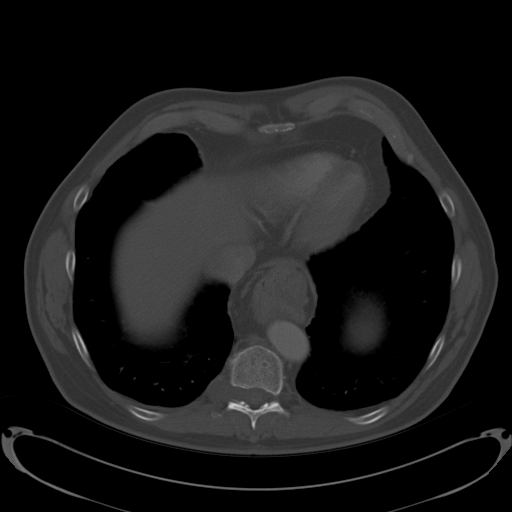
[im 95/135  soft-tissue]
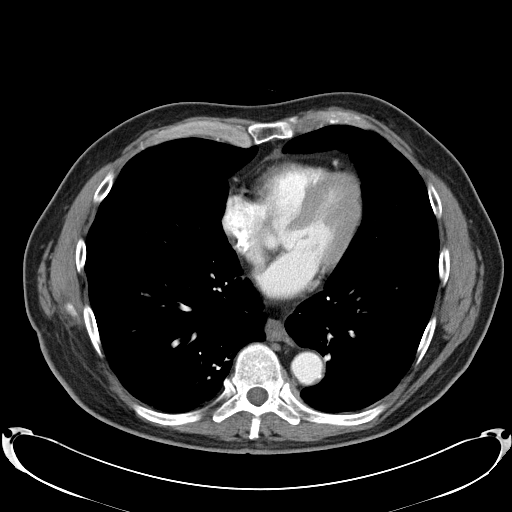
[im 103/135  soft-tissue]
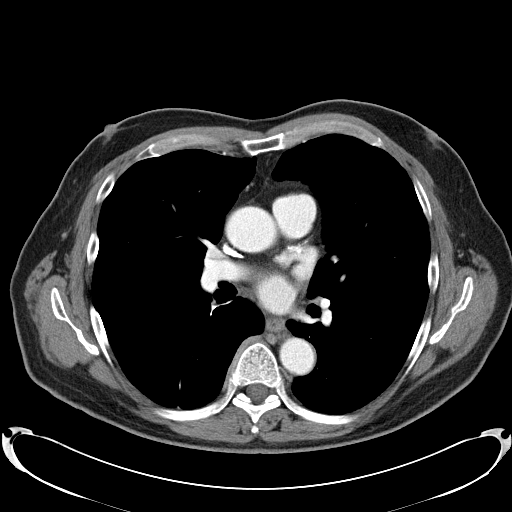
[im 119/135  soft-tissue]
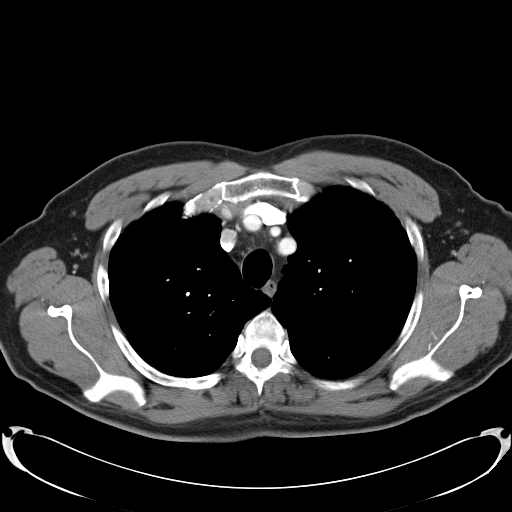
[im 127/135  soft-tissue]
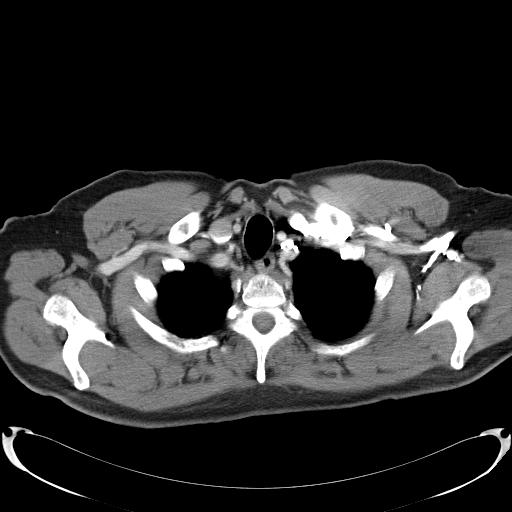

[Series 602: <mpr thick range> · coronal · 1.32mm/px · 3 of 98 slices shown]
[im 33/98  soft-tissue]
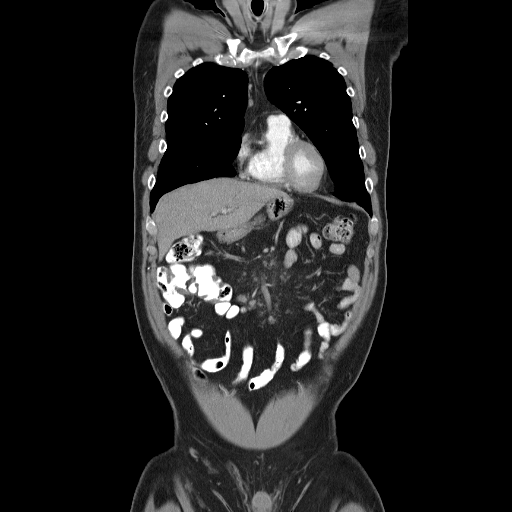
[im 44/98  soft-tissue]
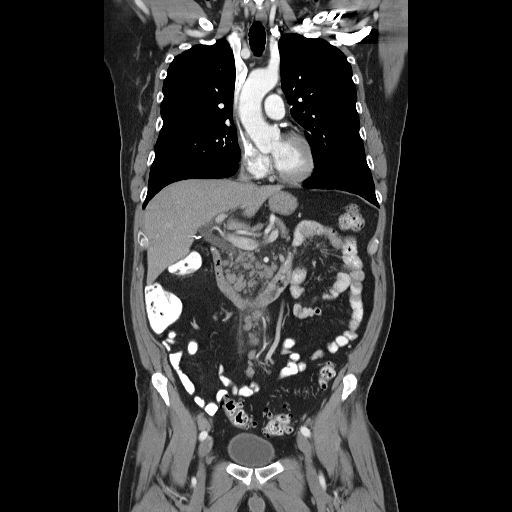
[im 54/98  soft-tissue]
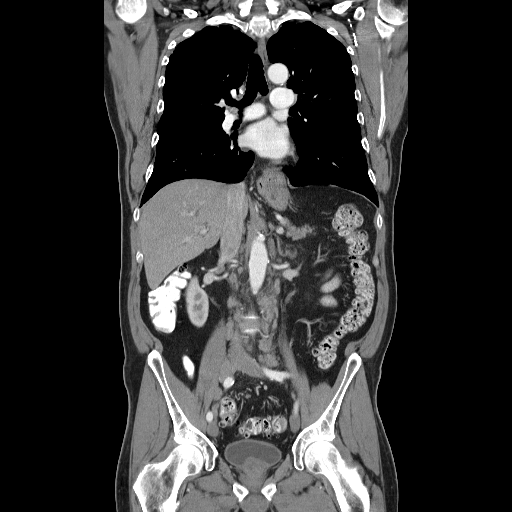

[16 of 46 positions shown; findings below may reference images not displayed]

FINDINGS: CT CHEST FINDINGS

Status post right lower lobe wedge resection. No suspicious
pulmonary nodules. Mild paraseptal emphysematous changes. Concern
effusion

Visualized thyroid is unremarkable.

The heart is normal in size. No pericardial effusion. Coronary
atherosclerosis. Mild atherosclerotic calcifications of the aortic
arch.

Small mediastinal lymph nodes measuring up to 7 mm, within normal
limits. 11 mm short axis right hilar node (series 2/image 30),
unchanged. No suspicious axillary lymphadenopathy. 8 mm retrocrural
node (series 2/ image 57), previously 10 mm.

Moderate hiatal hernia.

Again noted is subtle right prevertebral soft tissue thickening at
T7-8, measuring approximately 6 mm (series 2/ image 32), previously
5 mm. Stable left pleural soft tissue thickening measuring up to 13
mm (series 2/ image 43) at T9-10.

Multiple lateral meningoceles.

Degenerative changes of the thoracic spine.

CT ABDOMEN AND PELVIS FINDINGS

11 mm left hepatic lobe cyst (series 2/ image 53). Liver is
otherwise within normal limits.

Spleen, pancreas, and adrenal glands are within normal limits.

Status post cholecystectomy.  Stable common duct, measuring 11 mm.

Two left renal cysts measuring up to 14 mm in the posterior left
lower pole (series 2/ image 80). Right kidney is within normal
limits. No hydronephrosis.

No evidence of bowel obstruction. Colonic diverticulosis, without
associated inflammatory changes.

Atherosclerotic calcifications of the abdominal aorta and branch
vessels.

Retroperitoneal and pelvic lymphadenopathy, mildly progressed,
including:

--10 mm short axis aortocaval node (series 2/image 78), previously 6
mm

--13 mm short axis left para-aortic node (series 2/image 82),
unchanged

--11 mm short axis preaortic node (series 2/ image 91), unchanged

--19 mm short axis right right obturator node (series 2/image 110),
previously 13 mm

--17 mm short axis right external iliac node (series 2/image 112),
previously 14 mm

Prostatomegaly with enlargement of the central gland which indents
the base of the bladder.

Bladder is within normal limits.

Small fat containing right inguinal hernia.

Degenerative changes of the lumbar spine, most prominent at L5-S1.
IMPRESSION: Mild progression of retroperitoneal/pelvic lymphadenopathy, as
described above.

Mildly prominent right hilar and retrocrural nodes, grossly
unchanged.

Abnormal right prevertebral and left pleural soft tissue along the
lower thoracic spine, as described above, stable versus minimally
increased.

## 2013-02-13 MED ORDER — IOHEXOL 300 MG/ML  SOLN
100.0000 mL | Freq: Once | INTRAMUSCULAR | Status: AC | PRN
  Administered 2013-02-13: 100 mL via INTRAVENOUS

## 2013-02-16 ENCOUNTER — Telehealth: Payer: Self-pay | Admitting: Oncology

## 2013-02-16 ENCOUNTER — Ambulatory Visit (HOSPITAL_BASED_OUTPATIENT_CLINIC_OR_DEPARTMENT_OTHER): Payer: Medicare Other | Admitting: Oncology

## 2013-02-16 VITALS — BP 121/78 | HR 113 | Temp 97.9°F | Resp 18 | Ht 72.0 in | Wt 191.7 lb

## 2013-02-16 DIAGNOSIS — R1031 Right lower quadrant pain: Secondary | ICD-10-CM | POA: Diagnosis not present

## 2013-02-16 DIAGNOSIS — N259 Disorder resulting from impaired renal tubular function, unspecified: Secondary | ICD-10-CM | POA: Diagnosis not present

## 2013-02-16 DIAGNOSIS — C8590 Non-Hodgkin lymphoma, unspecified, unspecified site: Secondary | ICD-10-CM

## 2013-02-16 DIAGNOSIS — C8581 Other specified types of non-Hodgkin lymphoma, lymph nodes of head, face, and neck: Secondary | ICD-10-CM | POA: Diagnosis not present

## 2013-02-16 DIAGNOSIS — C8510 Unspecified B-cell lymphoma, unspecified site: Secondary | ICD-10-CM

## 2013-02-16 NOTE — Telephone Encounter (Signed)
Gave pt appt for lab and MD for June 2015, gave pt oral contrast

## 2013-02-16 NOTE — Progress Notes (Signed)
Hematology and Oncology Follow Up Visit  Anthony Kelly [id number] [birth year] 68 y.o. 02/16/2013 11:08 AM   Principle Diagnosis: Encounter Diagnoses  Name Primary?  . Low grade B-cell lymphoma Yes  . Malignant lymphoma, high grade      Interim History:   Visit for this pleasant 68 year old man who initially presented with left inguinal lymphadenopathy in June 2004 with biopsy showing low grade non-Hodgkin's lymphoma. He was treated with observation alone until November 2005 when the lymph node mass began to grow and repeat biopsy showed conversion to high-grade lymphoma. He achieved a complete remission with [facility] Rituxan. He developed a single right pulmonary lesion in November 2008 found on a routine followup scan. This was surgically resected 12/19/2006 and showed low-grade non-Hodgkin's lymphoma. He was treated with Rituxan intermittently for 2 years between December 2008 in August 2010. At time of a routine followup visit in March of 2013 he pointed out an enlarged left submandibular gland which he had noted for about one month. CT scans of the neck chest abdomen and pelvis showed an isolated lesion within the left submandibular gland. He was referred to ear nose and throat surgery and underwent excision of the gland on 04/30/2011 by Dr. Jerrell Belfast. Findings were consistent with an isolated focus of low-grade non-Hodgkin's lymphoma. Since the remainder of his restaging evaluation was unremarkable and the only site of obvious disease was surgically removed, I did not feel that we needed to put him back on any specific treatment. He has been followed with clinical exam and every 3 -four-month CT scans. He has a number of small mesenteric and retroperitoneal lymph nodes which have had minor fluctuations in size but overall have remained stable in an otherwise asymptomatic patient and I have not felt that he needed to be back on therapy at this time. This is true for the current scan done in  anticipation of today's visit on 02/13/2013 which I personally reviewed with the patient and his wife. Measurements on very small lymph nodes are subjective. I do not see any significant difference in any lymph node compared with scans going back as far as July 2014.  He had acute onset of vomiting and diarrhea last night which has already resolved. Nobody else at home is sick. He denies any constitutional symptoms. He remains active with a performance status 0.     Medications: reviewed  Allergies: No Known Allergies  Review of Systems: Hematology:  No bleeding ENT ROS: No sore throat Breast ROS:  Respiratory ROS: No cough or dyspnea Cardiovascular ROS:  No chest pain or palpitations Gastrointestinal ROS: See above    Genito-Urinary ROS: Not question Musculoskeletal ROS: No muscle bone or joint pain Neurological ROS: No headache or change in vision Dermatological ROS: No rash or ecchymosis Remaining ROS negative:   Physical Exam: Blood pressure 121/78, pulse 113, temperature 97.9 F (36.6 C), temperature source Oral, resp. rate 18, height 6' (1.829 m), weight 191 lb 11.2 oz (86.955 kg), SpO2 97.00%. Wt Readings from Last 3 Encounters:  02/16/13 191 lb 11.2 oz (86.955 kg)  11/14/12 190 lb 9.6 oz (86.456 kg)  10/30/12 191 lb 12.8 oz (87 kg)     General appearance: Well-nourished Caucasian man HENNT: Pharynx no erythema, exudate, mass, or ulcer. No thyromegaly or thyroid nodules Lymph nodes: No cervical, supraclavicular, or axillary lymphadenopathy Breasts:  Lungs: Clear to auscultation, resonant to percussion throughout Heart: Regular rhythm, no murmur, no gallop, no rub, no click, no edema Abdomen: Soft, nontender, normal bowel sounds,  no mass, no organomegaly Extremities: No edema, no calf tenderness Musculoskeletal: no joint deformities GU:  Vascular: Carotid pulses 2+, no bruits,  Neurologic: Alert, oriented, PERRLA,  cranial nerves grossly normal, motor strength 5  over 5, reflexes 1+ symmetric, upper body coordination normal, gait normal, Skin: No rash or ecchymosis  Lab Results: CBC W/Diff    Component Value Date/Time   WBC 8.6 02/13/2013 0749   WBC 7.1 04/26/2011 0941   RBC 5.89* 02/13/2013 0749   RBC 5.61 04/26/2011 0941   HGB 17.8* 02/13/2013 0749   HGB 17.1* 04/26/2011 0941   HCT 52.5* 02/13/2013 0749   HCT 49.0 04/26/2011 0941   PLT 200 02/13/2013 0749   PLT 170 04/26/2011 0941   MCV 89.2 02/13/2013 0749   MCV 87.3 04/26/2011 0941   MCH 30.3 02/13/2013 0749   MCH 30.5 04/26/2011 0941   MCHC 33.9 02/13/2013 0749   MCHC 34.9 04/26/2011 0941   RDW 13.5 02/13/2013 0749   RDW 13.2 04/26/2011 0941   LYMPHSABS 1.4 02/13/2013 0749   LYMPHSABS 1.1 02/20/2007 1208   MONOABS 0.9 02/13/2013 0749   MONOABS 0.7 02/20/2007 1208   EOSABS 0.2 02/13/2013 0749   EOSABS 0.2 02/20/2007 1208   BASOSABS 0.1 02/13/2013 0749   BASOSABS 0.0 02/20/2007 1208     Chemistry      Component Value Date/Time   NA 142 02/13/2013 0749   NA 139 07/20/2011 0800   NA 141 04/26/2011 0941   K 4.3 02/13/2013 0749   K 4.4 07/20/2011 0800   K 4.7 04/26/2011 0941   CL 104 03/24/2012 1024   CL 100 07/20/2011 0800   CL 106 04/26/2011 0941   CO2 28 02/13/2013 0749   CO2 29 07/20/2011 0800   CO2 26 04/26/2011 0941   BUN 14.5 02/13/2013 0749   BUN 14 07/20/2011 0800   BUN 15 04/26/2011 0941   CREATININE 1.0 02/13/2013 0749   CREATININE 1.2 07/20/2011 0800   CREATININE 1.00 04/26/2011 0941      Component Value Date/Time   CALCIUM 10.0 02/13/2013 0749   CALCIUM 8.7 07/20/2011 0800   CALCIUM 9.3 04/26/2011 0941   ALKPHOS 77 02/13/2013 0749   ALKPHOS 59 01/01/2013 0931   ALKPHOS 55 07/20/2011 0800   AST 22 02/13/2013 0749   AST 20 01/01/2013 0931   AST 25 07/20/2011 0800   ALT 25 02/13/2013 0749   ALT 21 01/01/2013 0931   ALT 29 07/20/2011 0800   BILITOT 0.94 02/13/2013 0749   BILITOT 0.7 01/01/2013 0931   BILITOT 1.00 07/20/2011 0800    Serum LDH: 137 ESR 1 mm  Radiological Studies: Ct Chest W Contrast  02/13/2013   CLINICAL DATA:   Non-Hodgkin's lymphoma diagnosed 2002, with lung metastases in 2009, with recurrent left submandibular node in 2013. Chemotherapy complete. Status post right lower lobectomy.  EXAM: CT CHEST, ABDOMEN, AND PELVIS WITH CONTRAST  TECHNIQUE: Multidetector CT imaging of the chest, abdomen and pelvis was performed following the standard protocol during bolus administration of intravenous contrast.  CONTRAST:  171m OMNIPAQUE IOHEXOL 300 MG/ML  SOLN  COMPARISON:  10/24/2012  FINDINGS: CT CHEST FINDINGS  Status post right lower lobe wedge resection. No suspicious pulmonary nodules. Mild paraseptal emphysematous changes. Concern effusion  Visualized thyroid is unremarkable.  The heart is normal in size. No pericardial effusion. Coronary atherosclerosis. Mild atherosclerotic calcifications of the aortic arch.  Small mediastinal lymph nodes measuring up to 7 mm, within normal limits. 11 mm short axis right hilar node (series 2/image  30), unchanged. No suspicious axillary lymphadenopathy. 8 mm retrocrural node (series 2/ image 57), previously 10 mm.  Moderate hiatal hernia.  Again noted is subtle right prevertebral soft tissue thickening at T7-8, measuring approximately 6 mm (series 2/ image 32), previously 5 mm. Stable left pleural soft tissue thickening measuring up to 13 mm (series 2/ image 43) at T9-10.  Multiple lateral meningoceles.  Degenerative changes of the thoracic spine.  CT ABDOMEN AND PELVIS FINDINGS  11 mm left hepatic lobe cyst (series 2/ image 53). Liver is otherwise within normal limits.  Spleen, pancreas, and adrenal glands are within normal limits.  Status post cholecystectomy.  Stable common duct, measuring 11 mm.  Two left renal cysts measuring up to 14 mm in the posterior left lower pole (series 2/ image 80). Right kidney is within normal limits. No hydronephrosis.  No evidence of bowel obstruction. Colonic diverticulosis, without associated inflammatory changes.  Atherosclerotic calcifications of the  abdominal aorta and branch vessels.  Retroperitoneal and pelvic lymphadenopathy, mildly progressed, including:  --10 mm short axis aortocaval node (series 2/image 78), previously 6 mm  --13 mm short axis left para-aortic node (series 2/image 82), unchanged  --11 mm short axis preaortic node (series 2/ image 91), unchanged  --19 mm short axis right right obturator node (series 2/image 110), previously 13 mm  --17 mm short axis right external iliac node (series 2/image 112), previously 14 mm  Prostatomegaly with enlargement of the central gland which indents the base of the bladder.  Bladder is within normal limits.  Small fat containing right inguinal hernia.  Degenerative changes of the lumbar spine, most prominent at L5-S1.  IMPRESSION: Mild progression of retroperitoneal/pelvic lymphadenopathy, as described above.  Mildly prominent right hilar and retrocrural nodes, grossly unchanged.  Abnormal right prevertebral and left pleural soft tissue along the lower thoracic spine, as described above, stable versus minimally increased.   Electronically Signed   By: Julian Hy M.D.   On: 02/13/2013 09:47     Impression:  #1. Discordant low-grade lymphoma converting to high-grade lymphoma with initial complete remission of the high-grade component and regrowth of the low-grade component in both nodal and extranodal (pulmonary) sites treated as outlined above. Minimal, nonbulky, retroperitoneal adenopathy overall stable on multiple scans. I see no indication to put him back on treatment at this time. We did talk about treatment options for the future when needed. I would consider single agent Ofatumumab or Zydelig.  I will transition his care to Dr. Alvy Bimler at time of his next visit. I have scheduled  lab and CT scans in 4 months.  CC: Patient Care Team: Chipper Herb, MD as PCP - General (Family Medicine) Fay Records, MD as Attending Physician (Cardiology)   Annia Belt, MD 2/6/201511:08  AM

## 2013-03-05 ENCOUNTER — Encounter: Payer: Self-pay | Admitting: Oncology

## 2013-03-06 NOTE — Telephone Encounter (Signed)
Called patient.  Spoke with wife who reports he is feeling better now.  "Left side is sore today but he is not doubled over.  Awakened yesterday morning in pain.  I have given him lots of water to drink and milk of magnesium for his bowels.  Had a BM earlier today that was hard to get out but they're moving.  He's had three flare ups, three times over the past year.  He has a disease I read in his CT Abdomen/Pelvis from three weeks ago.  It reads diverticulitis."  Asked who is treating him for this.  Has a new PCP, Dr. Stevan Born.  Forwarded CT to PCP and will notify Dr. Beryle Beams of this call.  Denies any need for intervention or help from this office at this time.  Last seen 02-16-2013 with next scheduled f/u 06-14-2013 for first visit by Dr. Alvy Bimler.

## 2013-03-07 ENCOUNTER — Encounter: Payer: Self-pay | Admitting: Family Medicine

## 2013-03-07 ENCOUNTER — Ambulatory Visit (INDEPENDENT_AMBULATORY_CARE_PROVIDER_SITE_OTHER): Payer: Medicare Other | Admitting: Family Medicine

## 2013-03-07 VITALS — BP 118/76 | HR 72 | Temp 97.1°F | Ht 72.0 in | Wt 193.6 lb

## 2013-03-07 DIAGNOSIS — K5732 Diverticulitis of large intestine without perforation or abscess without bleeding: Secondary | ICD-10-CM

## 2013-03-07 MED ORDER — CIPROFLOXACIN HCL 500 MG PO TABS: 500.0000 mg | ORAL_TABLET | Freq: Two times a day (BID) | ORAL | Status: DC

## 2013-03-07 MED ORDER — METRONIDAZOLE 500 MG PO TABS: 500.0000 mg | ORAL_TABLET | Freq: Three times a day (TID) | ORAL | Status: DC

## 2013-03-07 NOTE — Progress Notes (Signed)
   Subjective:    Patient ID: Anthony Kelly, male    DOB: 09/04/1945, 68 y.o.   MRN: [id number]  HPI This 68 y.o. male presents for evaluation of abdominal pain in left lower abdomen.  He has Hx of diverticulosis.   Review of Systems C/o left lower abdominal pain No chest pain, SOB, HA, dizziness, vision change, N/V, diarrhea, constipation, dysuria, urinary urgency or frequency, myalgias, arthralgias or rash.     Objective:   Physical Exam  Vital signs noted  Well developed well nourished male.  HEENT - Head atraumatic Normocephalic                Eyes - PERRLA, Conjuctiva - clear Sclera- Clear EOMI                Ears - EAC's Wnl TM's Wnl Gross Hearing WNL                Nose - Nares patent                 Throat - oropharanx wnl Respiratory - Lungs CTA bilateral Cardiac - RRR S1 and S2 without murmur GI - Abdomen tender in left lower quadrant and bowel sounds active x 4      Assessment & Plan:  Diverticulitis of colon (without mention of hemorrhage) Flagyl 500mg  one po tid x 10 days Cipro 500mg  one po bid x 10 days  Explained if having severe pain, fever, and sx's not improving follow up or go To ED.  Lysbeth Penner FNP

## 2013-03-26 ENCOUNTER — Ambulatory Visit: Payer: Medicare Other | Admitting: Interventional Cardiology

## 2013-05-06 ENCOUNTER — Other Ambulatory Visit: Payer: Self-pay | Admitting: Family Medicine

## 2013-05-08 ENCOUNTER — Emergency Department (HOSPITAL_COMMUNITY): Payer: Medicare Other

## 2013-05-08 ENCOUNTER — Encounter (HOSPITAL_COMMUNITY): Payer: Self-pay | Admitting: Emergency Medicine

## 2013-05-08 ENCOUNTER — Inpatient Hospital Stay (HOSPITAL_COMMUNITY)
Admission: EM | Admit: 2013-05-08 | Discharge: 2013-05-11 | DRG: 315 | Disposition: A | Discharge status: Home / Self Care | Payer: Medicare Other | Attending: Cardiology | Admitting: Cardiology

## 2013-05-08 DIAGNOSIS — Z7982 Long term (current) use of aspirin: Secondary | ICD-10-CM

## 2013-05-08 DIAGNOSIS — Z8249 Family history of ischemic heart disease and other diseases of the circulatory system: Secondary | ICD-10-CM | POA: Diagnosis not present

## 2013-05-08 DIAGNOSIS — J9819 Other pulmonary collapse: Secondary | ICD-10-CM | POA: Diagnosis present

## 2013-05-08 DIAGNOSIS — C7800 Secondary malignant neoplasm of unspecified lung: Secondary | ICD-10-CM | POA: Diagnosis present

## 2013-05-08 DIAGNOSIS — C8589 Other specified types of non-Hodgkin lymphoma, extranodal and solid organ sites: Secondary | ICD-10-CM | POA: Diagnosis present

## 2013-05-08 DIAGNOSIS — Z8679 Personal history of other diseases of the circulatory system: Secondary | ICD-10-CM

## 2013-05-08 DIAGNOSIS — R079 Chest pain, unspecified: Secondary | ICD-10-CM | POA: Diagnosis not present

## 2013-05-08 DIAGNOSIS — Z87891 Personal history of nicotine dependence: Secondary | ICD-10-CM | POA: Diagnosis not present

## 2013-05-08 DIAGNOSIS — I4891 Unspecified atrial fibrillation: Secondary | ICD-10-CM | POA: Diagnosis not present

## 2013-05-08 DIAGNOSIS — R072 Precordial pain: Secondary | ICD-10-CM | POA: Diagnosis not present

## 2013-05-08 DIAGNOSIS — I2510 Atherosclerotic heart disease of native coronary artery without angina pectoris: Secondary | ICD-10-CM | POA: Diagnosis present

## 2013-05-08 DIAGNOSIS — I319 Disease of pericardium, unspecified: Secondary | ICD-10-CM | POA: Diagnosis not present

## 2013-05-08 DIAGNOSIS — I309 Acute pericarditis, unspecified: Principal | ICD-10-CM | POA: Diagnosis present

## 2013-05-08 DIAGNOSIS — Z87898 Personal history of other specified conditions: Secondary | ICD-10-CM | POA: Diagnosis not present

## 2013-05-08 DIAGNOSIS — M129 Arthropathy, unspecified: Secondary | ICD-10-CM | POA: Diagnosis present

## 2013-05-08 DIAGNOSIS — I480 Paroxysmal atrial fibrillation: Secondary | ICD-10-CM | POA: Diagnosis not present

## 2013-05-08 HISTORY — DX: Acute pericarditis, unspecified: I30.9

## 2013-05-08 HISTORY — DX: Supraventricular tachycardia: I47.1

## 2013-05-08 HISTORY — DX: Unspecified atrial fibrillation: I48.91

## 2013-05-08 HISTORY — DX: Supraventricular tachycardia, unspecified: I47.10

## 2013-05-08 LAB — CBC
HCT: 45.2 % (ref 39.0–52.0)
HEMOGLOBIN: 15.7 g/dL (ref 13.0–17.0)
MCH: 30.2 pg (ref 26.0–34.0)
MCHC: 34.7 g/dL (ref 30.0–36.0)
MCV: 86.9 fL (ref 78.0–100.0)
Platelets: 156 10*3/uL (ref 150–400)
RBC: 5.2 MIL/uL (ref 4.22–5.81)
RDW: 13 % (ref 11.5–15.5)
WBC: 10.9 10*3/uL — AB (ref 4.0–10.5)

## 2013-05-08 LAB — COMPREHENSIVE METABOLIC PANEL
ALT: 24 U/L (ref 0–53)
AST: 24 U/L (ref 0–37)
Albumin: 3.9 g/dL (ref 3.5–5.2)
Alkaline Phosphatase: 67 U/L (ref 39–117)
BILIRUBIN TOTAL: 0.7 mg/dL (ref 0.3–1.2)
BUN: 16 mg/dL (ref 6–23)
CO2: 26 meq/L (ref 19–32)
CREATININE: 0.98 mg/dL (ref 0.50–1.35)
Calcium: 9.2 mg/dL (ref 8.4–10.5)
Chloride: 99 mEq/L (ref 96–112)
GFR calc Af Amer: >90 mL/min (ref >90)
GFR, EST NON AFRICAN AMERICAN: 83 mL/min — AB (ref >90)
GLUCOSE: 134 mg/dL — AB (ref 70–99)
Potassium: 4 mEq/L (ref 3.7–5.3)
Sodium: 137 mEq/L (ref 137–147)
Total Protein: 6.8 g/dL (ref 6.0–8.3)

## 2013-05-08 LAB — PROTIME-INR
INR: 1.14 (ref 0.00–1.49)
Prothrombin Time: 14.4 seconds (ref 11.6–15.2)

## 2013-05-08 LAB — TROPONIN I: Troponin I: <0.3 ng/mL (ref <0.30)

## 2013-05-08 LAB — APTT: aPTT: 30 seconds (ref 24–37)

## 2013-05-08 LAB — D-DIMER, QUANTITATIVE: D-Dimer, Quant: <0.27 ug/mL-FEU (ref 0.00–0.48)

## 2013-05-08 IMAGING — CR DG CHEST 2V
2 series · 2 of 2 positions shown · non-contrast
Comparison: Chest radiograph performed [DATE], and CT of the
chest performed [DATE]

CLINICAL DATA: Severe chest pain. History of non-Hodgkin's lymphoma
with lung metastases.

EXAM:
CHEST  2 VIEW

[view not recorded (1 of 2)]
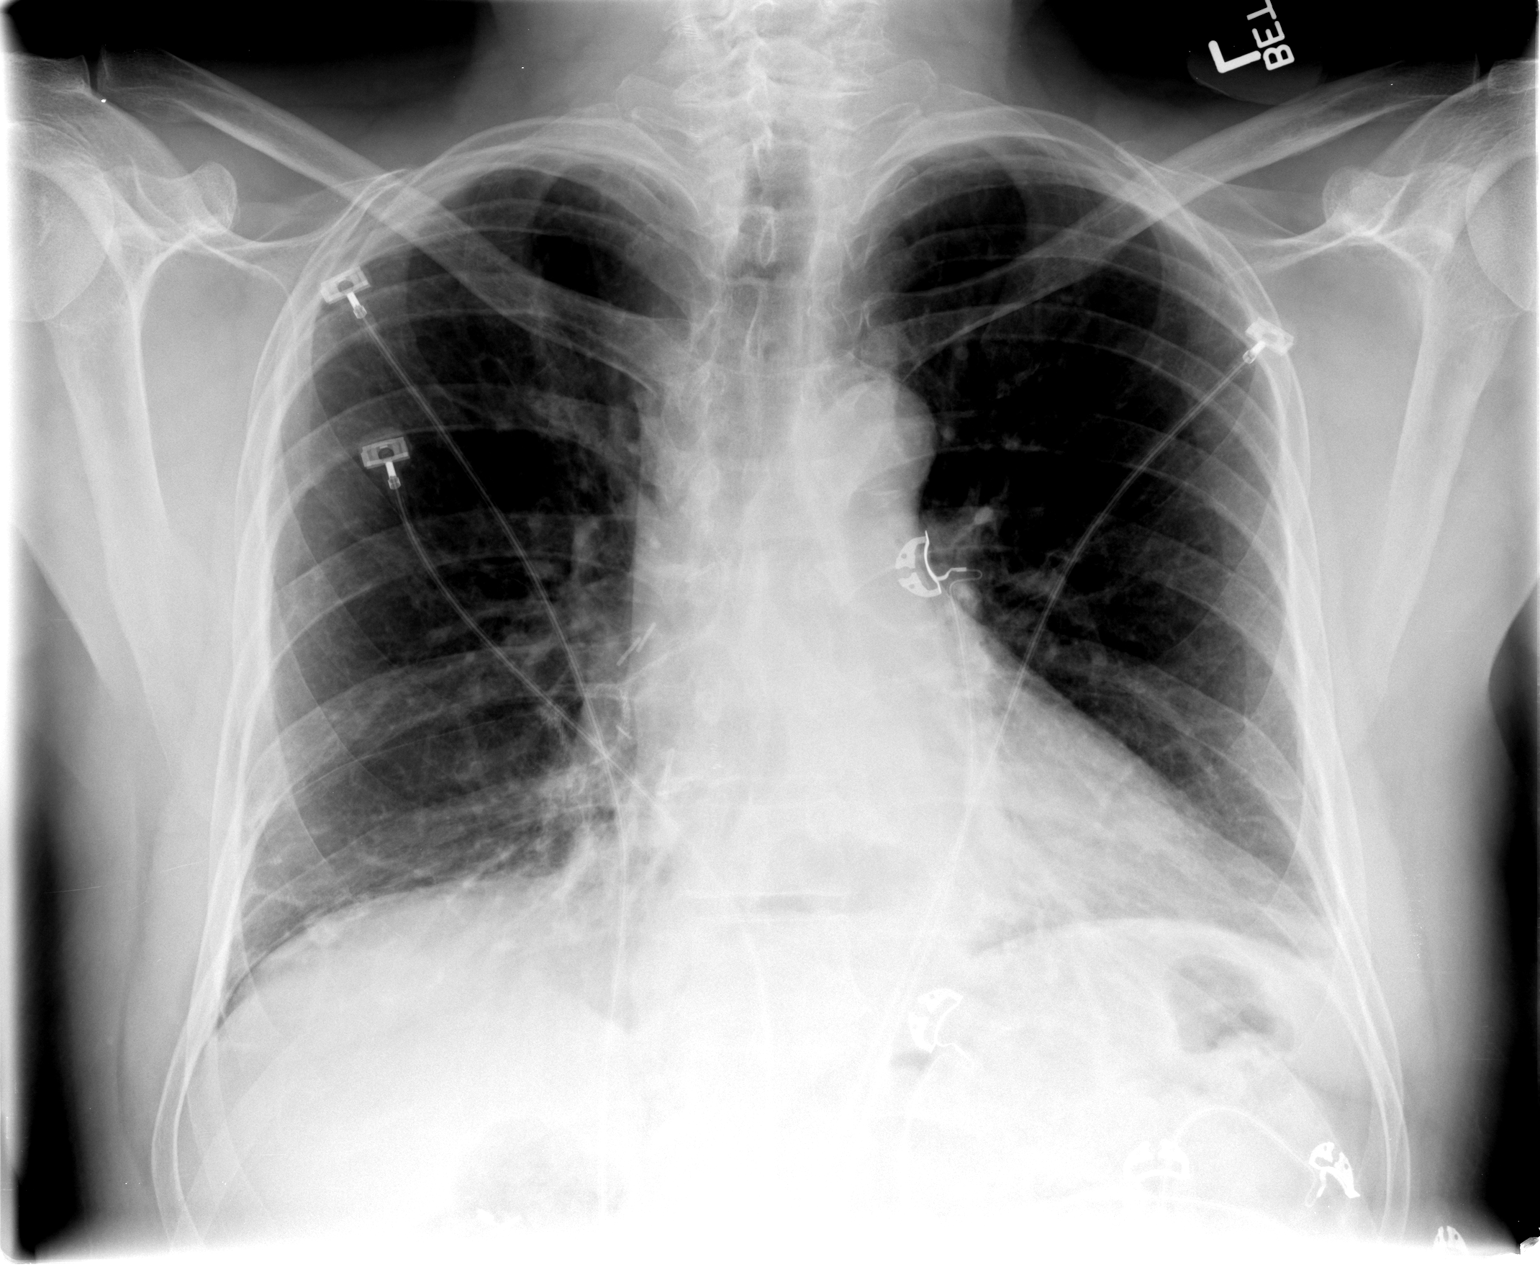

[view not recorded (2 of 2)]
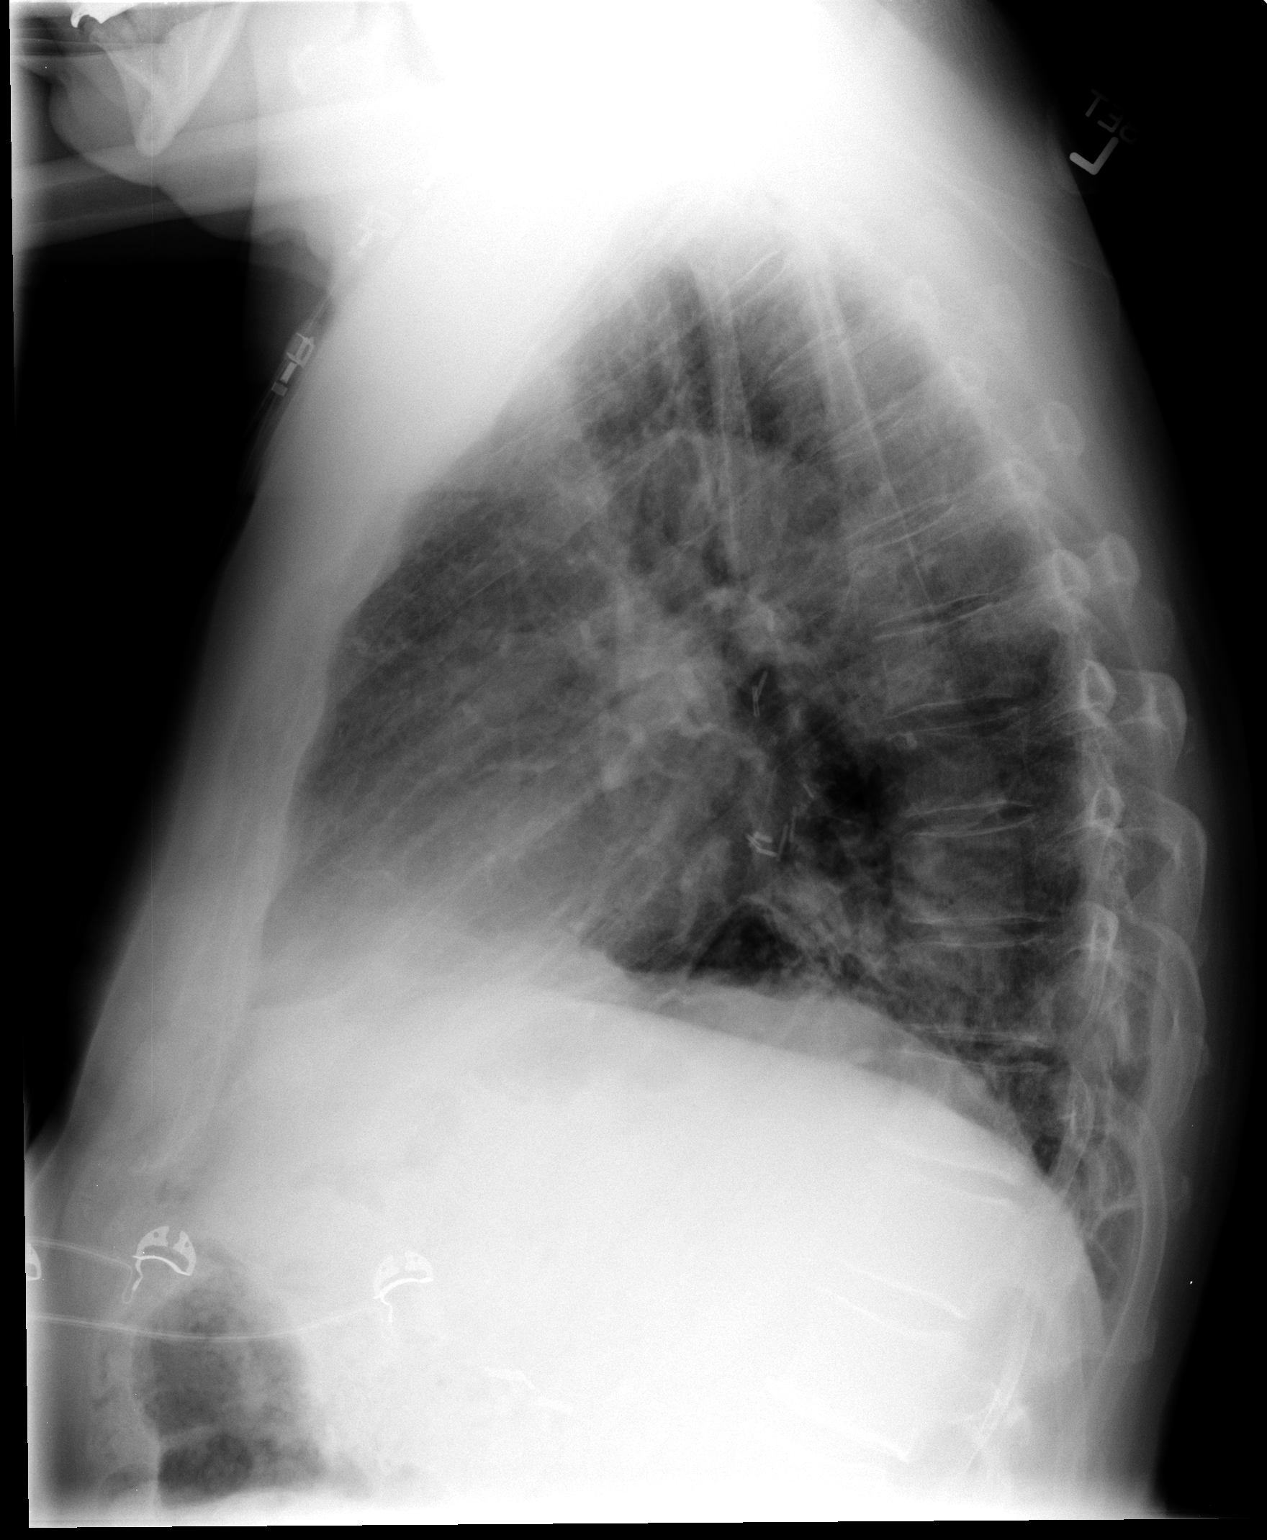

[2 of 2 positions shown; findings below may reference images not displayed]

FINDINGS: The lungs are well-aerated. Mild bibasilar airspace opacities likely
reflect atelectasis. There is no evidence of pleural effusion or
pneumothorax.

The heart is normal in size; the mediastinal contour is within
normal limits. No acute osseous abnormalities are seen. Clips are
noted within the right upper quadrant, reflecting prior
cholecystectomy.
IMPRESSION: Mild bibasilar airspace opacities likely reflect atelectasis. Lungs
otherwise grossly clear.

## 2013-05-08 MED ORDER — MORPHINE SULFATE 4 MG/ML IJ SOLN
4.0000 mg | Freq: Once | INTRAMUSCULAR | Status: AC
  Administered 2013-05-08: 4 mg via INTRAVENOUS
  Filled 2013-05-08: qty 1

## 2013-05-08 MED ORDER — ONDANSETRON HCL 4 MG/2ML IJ SOLN
INTRAMUSCULAR | Status: AC
  Filled 2013-05-08: qty 2

## 2013-05-08 MED ORDER — ASPIRIN 81 MG PO CHEW
324.0000 mg | CHEWABLE_TABLET | Freq: Once | ORAL | Status: AC
  Administered 2013-05-08: 324 mg via ORAL
  Filled 2013-05-08: qty 4

## 2013-05-08 MED ORDER — SODIUM CHLORIDE 0.9 % IV SOLN
1000.0000 mL | INTRAVENOUS | Status: DC
Start: 2013-05-08 — End: 2013-05-11
  Administered 2013-05-08: 1000 mL via INTRAVENOUS

## 2013-05-08 MED ORDER — ONDANSETRON HCL 4 MG/2ML IJ SOLN
4.0000 mg | Freq: Once | INTRAMUSCULAR | Status: AC
  Administered 2013-05-08: via INTRAVENOUS

## 2013-05-08 NOTE — ED Provider Notes (Signed)
CSN: [id number]     Arrival date & time 05/08/13  2155 History  This chart was scribed for Kathalene Frames, MD by Rolanda Lundborg, ED Scribe. This patient was seen in room APA10/APA10 and the patient's care was started at 10:11 PM.    Chief Complaint  Patient presents with  . Chest Pain   The history is provided by the patient. No language interpreter was used.   HPI Comments: Bodin Gorka is a 68 y.o. male who presents to the Emergency Department complaining of constant sharp CP onset this morning with associated palpitations. Breathing makes the pain worse. He denies prior h/o CP. He had a right lobectomy and cancer is still active. No lesions in the chest. He denies injuries or falls. He did some yard work yesterday. He denies nausea, vomiting, cough, SOB, diarrhea.   Past Medical History  Diagnosis Date  . Dysrhythmia     SVT sees Dr. Irish Lack had recent EKG at office  . Arthritis   . 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 Metastasis to lymph nodes dx'd 03/2011    lt submandibular ln   Past Surgical History  Procedure Laterality Date  . Lung lobectomy      right side  . Cholecystectomy    . Lymph node biopsy      in groin with removal  . Meniscus repair      right knee  . Vein ligation and stripping      right leg  . Tonsillectomy      as a child  . Submandibular gland excision  04/2011  . Submandibular gland excision  04/30/2011    Procedure: EXCISION SUBMANDIBULAR GLAND;  Surgeon: Jerrell Belfast, MD;  Location: [facility];  Service: ENT;  Laterality: Left;  WITH DIAGNOSTIC BIOPSY  . Exploratory laparotomy     Family History  Problem Relation Age of Onset  . Anesthesia problems Neg Hx   .  Heart disease Father    History  Substance Use Topics  . Smoking status: Former Smoker -- 1.50 packs/day for 25 years    Quit date: 01/11/1990  . Smokeless tobacco: Never Used  . Alcohol Use: No    Review of Systems  Respiratory: Negative for cough and shortness of breath.   Cardiovascular: Positive for chest pain and palpitations.  Gastrointestinal: Negative for nausea, vomiting and diarrhea.   A complete 10 system review of systems was obtained and all systems are negative except as noted in the HPI and PMH.     Allergies  Review of patient's allergies indicates no known allergies.  Home Medications   Prior to Admission medications   Medication Sig Start Date End Date Taking? Authorizing Provider  aspirin 81 MG tablet Take 81 mg by mouth daily.    Historical Provider, MD  atorvastatin (LIPITOR) 10 MG tablet 5 mg. Take one half tablet once daily 10/04/12   Fay Records, MD  ciprofloxacin (CIPRO) 500 MG tablet Take 1 tablet (500 mg total) by mouth 2 (two) times daily. 03/07/13   Lysbeth Penner, FNP  metoprolol succinate (TOPROL-XL) 25 MG 24 hr tablet TAKE 1 TABLET DAILY 02/01/13   Fay Records, MD  metroNIDAZOLE (FLAGYL)  500 MG tablet Take 1 tablet (500 mg total) by mouth 3 (three) times daily. 03/07/13   Lysbeth Penner, FNP  Multiple Vitamin (MULTIVITAMIN) tablet Take 1 tablet by mouth daily.    Historical Provider, MD  ZYRTEC ALLERGY 10 MG tablet TAKE 1 TABLET DAILY    Lysbeth Penner, FNP   BP 114/66  Pulse 80  Temp(Src) 98.4 F (36.9 C) (Oral)  Resp 20  Ht 6' (1.829 m)  Wt 180 lb (81.647 kg)  BMI 24.41 kg/m2  SpO2 94% Physical Exam  Nursing note and vitals reviewed. Constitutional: He appears well-developed and well-nourished. No distress.  HENT:  Head: Normocephalic and atraumatic.  Right Ear: External ear normal.  Left Ear: External ear normal.  Eyes: Conjunctivae are normal. Right eye exhibits no discharge. Left eye exhibits no discharge. No scleral icterus.   Neck: Neck supple. No tracheal deviation present.  Cardiovascular: Normal rate, regular rhythm and intact distal pulses.   Pulmonary/Chest: Effort normal and breath sounds normal. No stridor. No respiratory distress. He has no wheezes. He has no rales. He exhibits tenderness.  Abdominal: Soft. Bowel sounds are normal. He exhibits no distension. There is no tenderness. There is no rebound and no guarding.  Musculoskeletal: He exhibits no edema and no tenderness.  Neurological: He is alert. He has normal strength. No cranial nerve deficit (no facial droop, extraocular movements intact, no slurred speech) or sensory deficit. He exhibits normal muscle tone. He displays no seizure activity. Coordination normal.  Skin: Skin is warm and dry. No rash noted.  Psychiatric: He has a normal mood and affect.    ED Course  Procedures (including critical care time) Medications  0.9 %  sodium chloride infusion (1,000 mLs Intravenous New Bag/Given 05/08/13 2252)  ondansetron (ZOFRAN) injection 4 mg (not administered)  ondansetron (ZOFRAN) 4 MG/2ML injection (not administered)  morphine 4 MG/ML injection 4 mg (not administered)  aspirin chewable tablet 324 mg (324 mg Oral Given 05/08/13 2251)  morphine 4 MG/ML injection 4 mg (4 mg Intravenous Given 05/08/13 2258)    DIAGNOSTIC STUDIES: Oxygen Saturation is 94% on [place], adequate by my interpretation.    COORDINATION OF CARE: 10:23 PM- Discussed treatment plan with pt. Pt agrees to plan.  2348  Discussed initial labs with patient.  Troponin normal.  Pt is now having recurrent severe pain.  Will check EKG.  Pain seems to come in waves, intense.  Will CT chest to evaluate for aneurysm, dissection.  6767  Prelim CT scan without aneurysm or dissection.  Reviewed EKG with Dr Claiborne Billings.  Agrees not a code stemi.    Labs Review Labs Reviewed  CBC - Abnormal; Notable for the following:    WBC 10.9 (*)    All other components within normal limits  COMPREHENSIVE  METABOLIC PANEL - Abnormal; Notable for the following:    Glucose, Bld 134 (*)    GFR calc non Af Amer 83 (*)    All other components within normal limits  APTT  PROTIME-INR  TROPONIN I  D-DIMER, QUANTITATIVE  TROPONIN I    Imaging Review Dg Chest 2 View  05/08/2013   CLINICAL DATA:  Severe chest pain. History of non-Hodgkin's lymphoma with lung metastases.  EXAM: CHEST  2 VIEW  COMPARISON:  Chest radiograph performed 04/26/2011, and CT of the chest performed 02/13/2013  FINDINGS: The lungs are well-aerated. Mild bibasilar airspace opacities likely reflect atelectasis. There is no evidence of pleural effusion or pneumothorax.  The heart is normal in size;  the mediastinal contour is within normal limits. No acute osseous abnormalities are seen. Clips are noted within the right upper quadrant, reflecting prior cholecystectomy.  IMPRESSION: Mild bibasilar airspace opacities likely reflect atelectasis. Lungs otherwise grossly clear.   Electronically Signed   By: Garald Balding M.D.   On: 05/08/2013 23:56     EKG Interpretation   Date/Time:  Tuesday May 08 2013 22:16:55 EDT Ventricular Rate:  80 PR Interval:  148 QRS Duration: 84 QT Interval:  368 QTC Calculation: 424 R Axis:   53 Text Interpretation:  Sinus rhythm Borderline ST elevation, lateral leads  Otherwise no significant change Confirmed by Nathaneil Feagans  MD-J, Reya Aurich ([id number]) on  05/08/2013 10:33:02 PM      EKG Interpretation  Date/Time:  Tuesday May 08 2013 23:47:05 EDT Ventricular Rate:  63 PR Interval:  154 QRS Duration: 83 QT Interval:  395 QTC Calculation: 404 R Axis:   66 Text Interpretation:  Sinus rhythm Borderline ST elevation, inferior leads t wave amplitude increased since prior EKG Confirmed by Synthia Fairbank  MD-J, Earline Stiner ([id number]) on 05/09/2013 12:02:10 AM        MDM   Final diagnoses:  None   Pt with severe sharp chest pain. Increasing in intensity.  EKG showing some changes which are concerning but not definitive for  stemi.  Will proceed with chest ct to evaluate for dissection, aneurysm.   Repeat troponin,.  Blood pressure per report dropped in the 70s.  Pt is not diaphoretic or lightheaded at the bedside.  In severe pain.  Will repeat the blood pressure, bilateral upper extremities..  Repeat BP improved.  Will transfer to [facility], cardiology for further evaluation.   I personally performed the services described in this documentation, which was scribed in my presence.  The recorded information has been reviewed and is accurate.   Kathalene Frames, MD 05/09/13 [phone]

## 2013-05-08 NOTE — ED Notes (Signed)
Pt became diaphoretic, nausea, sob, complaining that chest pain is worse. MD aware, came to bedside, orders given

## 2013-05-08 NOTE — ED Notes (Signed)
Chest pain, all day no n/v, skin warm and dry

## 2013-05-09 ENCOUNTER — Emergency Department (HOSPITAL_COMMUNITY): Payer: Medicare Other

## 2013-05-09 DIAGNOSIS — I319 Disease of pericardium, unspecified: Secondary | ICD-10-CM | POA: Diagnosis not present

## 2013-05-09 DIAGNOSIS — I309 Acute pericarditis, unspecified: Secondary | ICD-10-CM | POA: Diagnosis not present

## 2013-05-09 DIAGNOSIS — R079 Chest pain, unspecified: Secondary | ICD-10-CM | POA: Diagnosis not present

## 2013-05-09 DIAGNOSIS — C8589 Other specified types of non-Hodgkin lymphoma, extranodal and solid organ sites: Secondary | ICD-10-CM | POA: Diagnosis not present

## 2013-05-09 DIAGNOSIS — J9819 Other pulmonary collapse: Secondary | ICD-10-CM | POA: Diagnosis not present

## 2013-05-09 DIAGNOSIS — R072 Precordial pain: Secondary | ICD-10-CM

## 2013-05-09 DIAGNOSIS — C7800 Secondary malignant neoplasm of unspecified lung: Secondary | ICD-10-CM | POA: Diagnosis not present

## 2013-05-09 LAB — CBC
HCT: 43.2 % (ref 39.0–52.0)
Hemoglobin: 14.6 g/dL (ref 13.0–17.0)
MCH: 30 pg (ref 26.0–34.0)
MCHC: 33.8 g/dL (ref 30.0–36.0)
MCV: 88.7 fL (ref 78.0–100.0)
PLATELETS: 140 10*3/uL — AB (ref 150–400)
RBC: 4.87 MIL/uL (ref 4.22–5.81)
RDW: 13.4 % (ref 11.5–15.5)
WBC: 9.2 10*3/uL (ref 4.0–10.5)

## 2013-05-09 LAB — TROPONIN I: Troponin I: <0.3 ng/mL (ref <0.30)

## 2013-05-09 LAB — BASIC METABOLIC PANEL
BUN: 14 mg/dL (ref 6–23)
CALCIUM: 8.7 mg/dL (ref 8.4–10.5)
CO2: 24 mEq/L (ref 19–32)
Chloride: 104 mEq/L (ref 96–112)
Creatinine, Ser: 0.95 mg/dL (ref 0.50–1.35)
GFR calc Af Amer: >90 mL/min (ref >90)
GFR, EST NON AFRICAN AMERICAN: 84 mL/min — AB (ref >90)
Glucose, Bld: 127 mg/dL — ABNORMAL HIGH (ref 70–99)
POTASSIUM: 4.4 meq/L (ref 3.7–5.3)
Sodium: 141 mEq/L (ref 137–147)

## 2013-05-09 LAB — HEPARIN LEVEL (UNFRACTIONATED)
Heparin Unfractionated: 0.15 IU/mL — ABNORMAL LOW (ref 0.30–0.70)
Heparin Unfractionated: <0.1 IU/mL — ABNORMAL LOW (ref 0.30–0.70)

## 2013-05-09 LAB — C-REACTIVE PROTEIN: CRP: 6.4 mg/dL — ABNORMAL HIGH (ref <0.60)

## 2013-05-09 LAB — SEDIMENTATION RATE: Sed Rate: 2 mm/hr (ref 0–16)

## 2013-05-09 IMAGING — CT CT ANGIO CHEST
2 of 6 series · 17 of 46 positions shown · IV contrast (Omnipaque 300)
Comparison: CT of the chest performed [DATE]

CLINICAL DATA: Chest pain. Diaphoresis and nausea. History of
B-cell lymphoma, with metastases to the lung. Assess for dissection.

EXAM:
CT ANGIOGRAPHY CHEST WITH CONTRAST
TECHNIQUE: Multidetector CT imaging of the chest was performed using the
standard protocol during bolus administration of intravenous
contrast. Multiplanar CT image reconstructions and MIPs were
obtained to evaluate the vascular anatomy.
CONTRAST:  100mL OMNIPAQUE IOHEXOL 350 MG/ML SOLN

[Series 5: dissection 3.0 b40f · axial · 0.82mm/px · z∈[-348,-57]mm · 15 of 109 slices shown]
[im 6/109  lung]
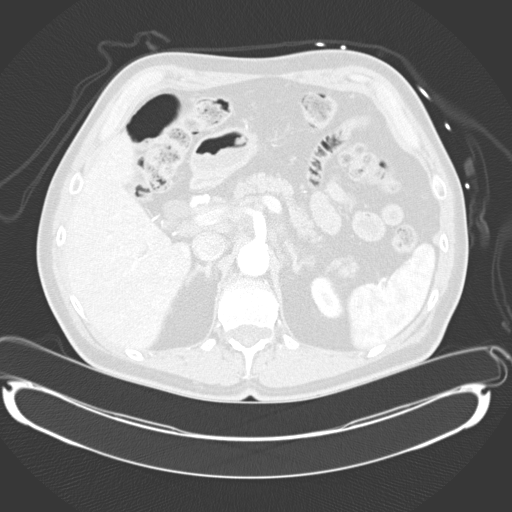
[im 12/109  soft-tissue]
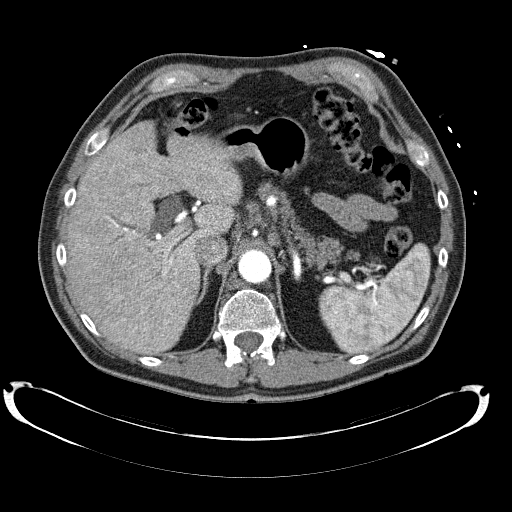
[im 23/109  lung]
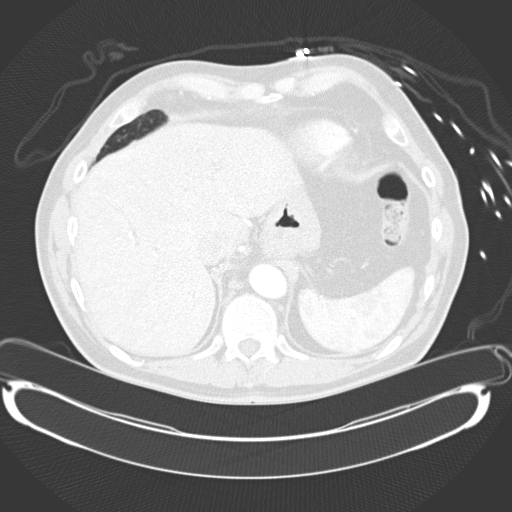
[im 29/109  soft-tissue]
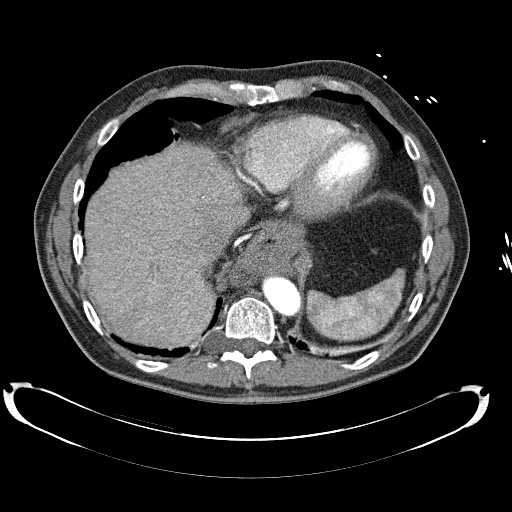
[im 35/109  lung]
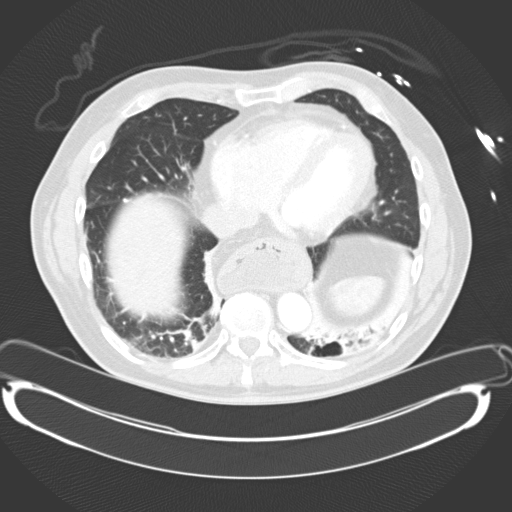
[im 40/109  soft-tissue]
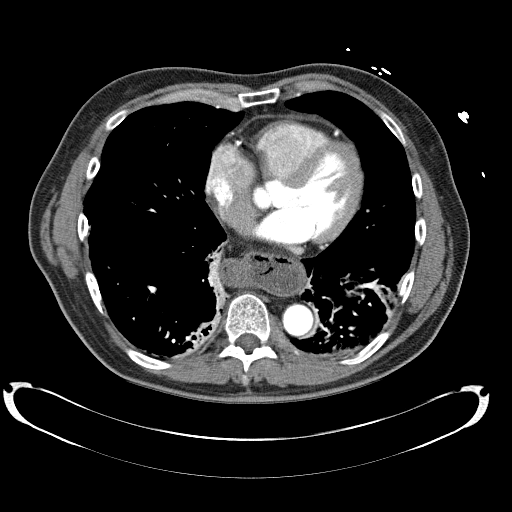
[im 46/109  lung]
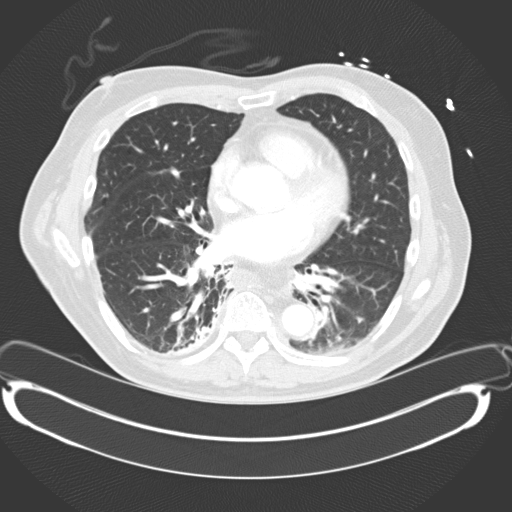
[im 57/109  soft-tissue]
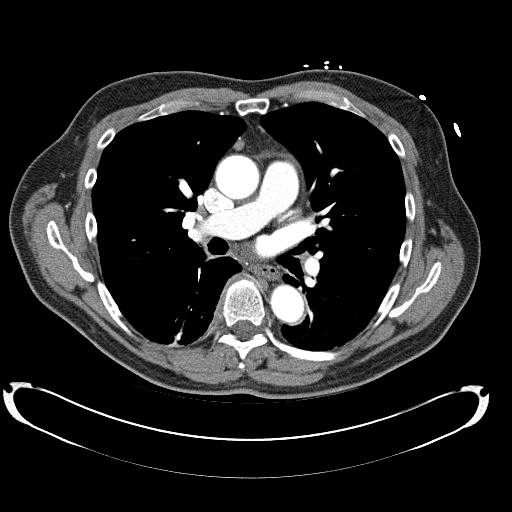
[im 63/109  lung]
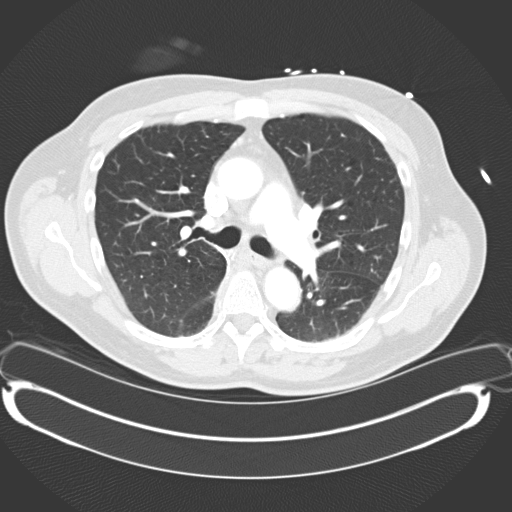
[im 69/109  soft-tissue]
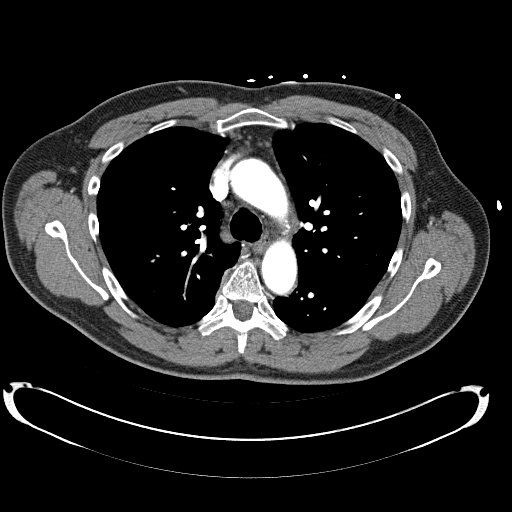
[im 74/109  lung]
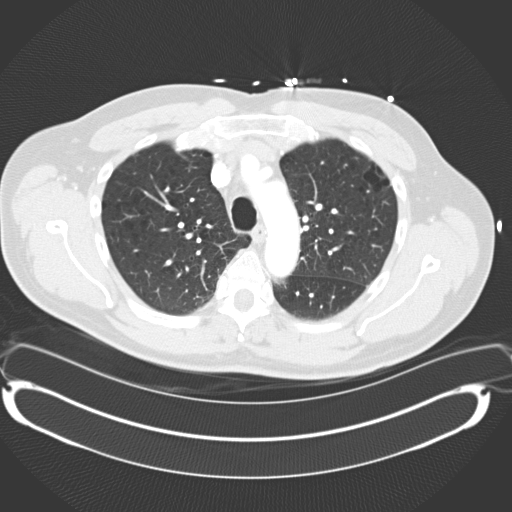
[im 80/109  soft-tissue]
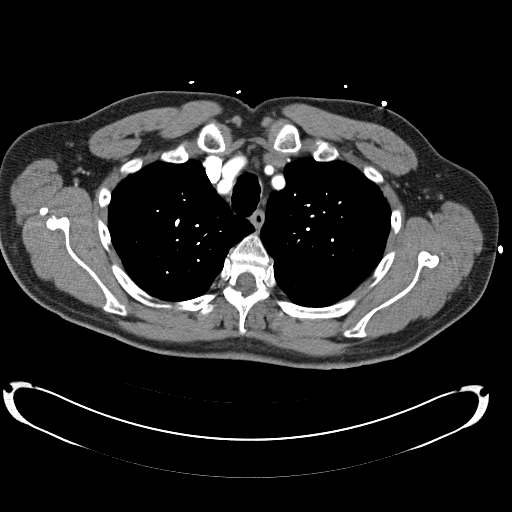
[im 91/109  lung]
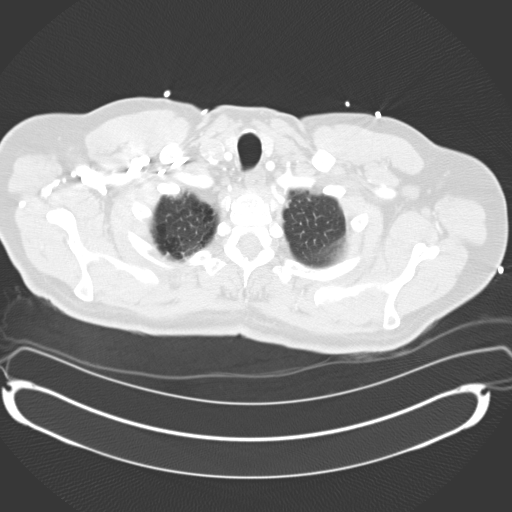
[im 97/109  soft-tissue]
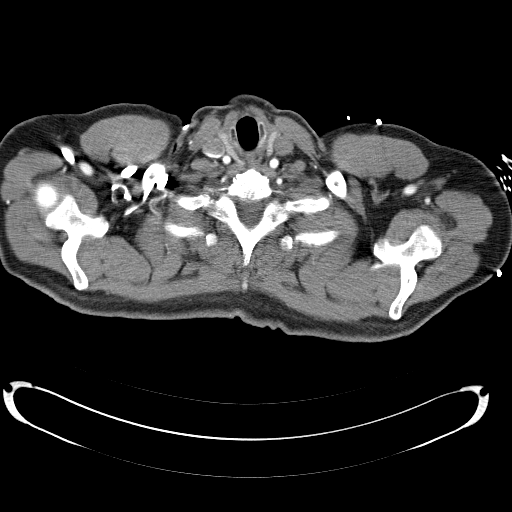
[im 103/109  lung]
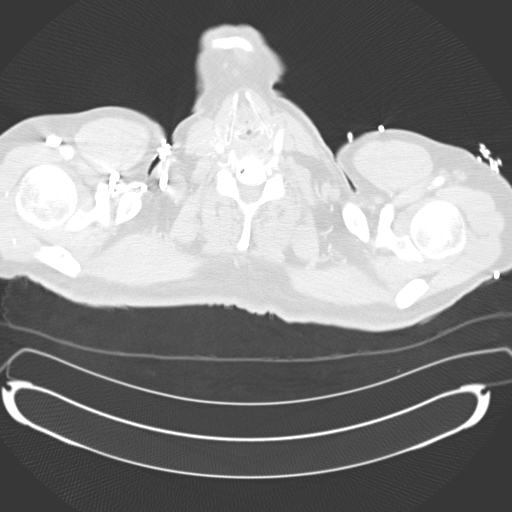

[Series 7: mpr cor post contrast · coronal · 0.62mm/px · 2 of 99 slices shown]
[im 33/99  soft-tissue]
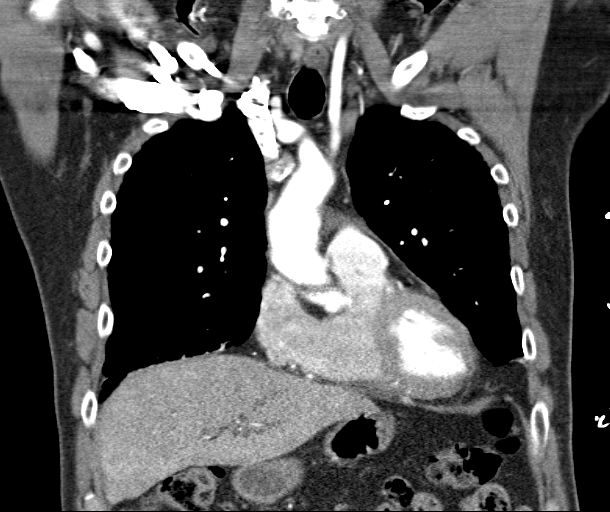
[im 66/99  soft-tissue]
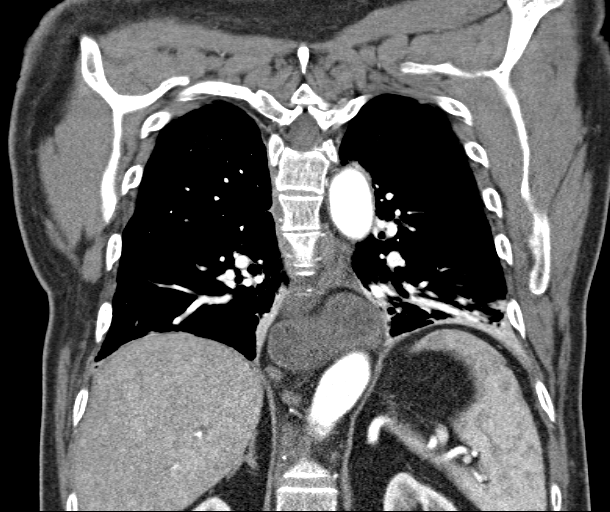

[17 of 46 positions shown; findings below may reference images not displayed]

FINDINGS: There is no evidence of aortic dissection. No aneurysmal dilatation
is seen. Minimal calcific atherosclerotic disease is seen, without
significant mural thrombus or luminal narrowing.

There is no evidence of pulmonary embolus.

Mild emphysematous change is noted at the lung apices. Bibasilar
airspace opacification likely reflects atelectasis. Postoperative
change is noted at the right hilum. There is no evidence of pleural
effusion or pneumothorax. No masses are identified; no abnormal
focal contrast enhancement is seen.

Scattered coronary artery calcifications are seen. A few mildly
prominent nodes are seen, measuring 1.2 cm in the subcarinal region
and 1.1 cm at the right hilum. These are of uncertain significance,
but could reflect the patient's history of lymphoma. No pericardial
effusion is identified. The great vessels are unremarkable in
appearance. No axillary lymphadenopathy is seen. The thyroid gland
is unremarkable in appearance.

There is diffuse prominence of the intrahepatic biliary ducts and
common hepatic duct, likely reflecting prior cholecystectomy. A
cm hypodensity within the left hepatic lobe is nonspecific but may
reflect a small cyst. The visualized portions of the spleen are
unremarkable. Note is made of a moderate hiatal hernia. Mild
apparent wall thickening along the body of the stomach is thought to
reflect intraluminal contents.

There is diffuse retroperitoneal haziness, thought to reflect the
patient's history of lymphoma. A 1.4 cm node is seen adjacent to the
left adrenal gland. Scattered smaller peripancreatic nodes are seen.

No acute osseous abnormalities are seen. Degenerative change is
noted at the lower cervical spine.

Review of the MIP images confirms the above findings.
IMPRESSION: 1. No evidence of aortic dissection. No aneurysmal dilatation seen.
Minimal calcific atherosclerotic disease noted.
2. No evidence of pulmonary embolus.
3. Mild emphysematous change at the lung apices. Bibasilar airspace
opacification likely reflects atelectasis.
4. Scattered coronary artery calcifications seen.
5. Mildly prominent subcarinal and right hilar nodes seen, measuring
up to 1.2 cm. These are of uncertain significance, but could reflect
the patient's history of lymphoma.
6. Diffuse retroperitoneal haziness at the upper abdomen, thought to
reflect the patient's history of lymphoma. 1.4 cm node seen adjacent
to the left adrenal gland. Scattered smaller peripancreatic nodes
seen.
7. Moderate hiatal hernia noted. Mild apparent wall thickening along
the body of the stomach is thought to reflect intraluminal contents.
8. Possible small hepatic cyst noted.

## 2013-05-09 MED ORDER — ACETAMINOPHEN 500 MG PO TABS: 1000.0000 mg | ORAL_TABLET | Freq: Once | ORAL | Status: AC | PRN

## 2013-05-09 MED ORDER — IBUPROFEN 800 MG PO TABS
800.0000 mg | ORAL_TABLET | Freq: Three times a day (TID) | ORAL | Status: DC
  Administered 2013-05-09 – 2013-05-10 (×6): 800 mg via ORAL
  Filled 2013-05-09 (×11): qty 1

## 2013-05-09 MED ORDER — MORPHINE SULFATE 2 MG/ML IJ SOLN
INTRAMUSCULAR | Status: AC
  Filled 2013-05-09: qty 1

## 2013-05-09 MED ORDER — MORPHINE SULFATE 2 MG/ML IJ SOLN
2.0000 mg | Freq: Once | INTRAMUSCULAR | Status: AC
  Administered 2013-05-09: 2 mg via INTRAVENOUS

## 2013-05-09 MED ORDER — DEXTROSE 5 % IV SOLN
5.0000 mg/h | INTRAVENOUS | Status: DC
  Administered 2013-05-09 – 2013-05-10 (×2): 5 mg/h via INTRAVENOUS
  Filled 2013-05-09 (×2): qty 100

## 2013-05-09 MED ORDER — MORPHINE SULFATE 4 MG/ML IJ SOLN
4.0000 mg | Freq: Once | INTRAMUSCULAR | Status: AC
  Administered 2013-05-09: 4 mg via INTRAVENOUS

## 2013-05-09 MED ORDER — HEPARIN (PORCINE) IN NACL 100-0.45 UNIT/ML-% IJ SOLN
12.0000 [IU]/kg/h | Freq: Once | INTRAMUSCULAR | Status: AC
  Administered 2013-05-09: 12 [IU]/kg/h via INTRAVENOUS
  Filled 2013-05-09: qty 250

## 2013-05-09 MED ORDER — COLCHICINE 0.6 MG PO TABS
0.6000 mg | ORAL_TABLET | Freq: Two times a day (BID) | ORAL | Status: DC
  Administered 2013-05-09 – 2013-05-10 (×4): 0.6 mg via ORAL
  Filled 2013-05-09 (×7): qty 1

## 2013-05-09 MED ORDER — IOHEXOL 350 MG/ML SOLN
100.0000 mL | Freq: Once | INTRAVENOUS | Status: AC | PRN
  Administered 2013-05-09: 100 mL via INTRAVENOUS

## 2013-05-09 MED ORDER — ASPIRIN EC 81 MG PO TBEC
81.0000 mg | DELAYED_RELEASE_TABLET | Freq: Every morning | ORAL | Status: DC
  Administered 2013-05-09 – 2013-05-11 (×3): 81 mg via ORAL
  Filled 2013-05-09 (×3): qty 1

## 2013-05-09 MED ORDER — OFF THE BEAT BOOK
Freq: Once | Status: AC
  Administered 2013-05-09: 1
  Filled 2013-05-09: qty 1

## 2013-05-09 MED ORDER — MORPHINE SULFATE 4 MG/ML IJ SOLN
INTRAMUSCULAR | Status: AC
  Filled 2013-05-09: qty 1

## 2013-05-09 MED ORDER — ENOXAPARIN SODIUM 40 MG/0.4ML ~~LOC~~ SOLN: 40.0000 mg | SUBCUTANEOUS | Status: DC

## 2013-05-09 MED ORDER — HEPARIN SODIUM (PORCINE) 5000 UNIT/ML IJ SOLN
4000.0000 [IU] | Freq: Once | INTRAMUSCULAR | Status: AC
  Administered 2013-05-09: 4000 [IU] via INTRAVENOUS

## 2013-05-09 MED ORDER — DILTIAZEM LOAD VIA INFUSION
15.0000 mg | Freq: Once | INTRAVENOUS | Status: AC
  Administered 2013-05-09: 15 mg via INTRAVENOUS
  Filled 2013-05-09: qty 15

## 2013-05-09 MED ORDER — ATORVASTATIN CALCIUM 10 MG PO TABS
5.0000 mg | ORAL_TABLET | Freq: Every day | ORAL | Status: DC
  Administered 2013-05-09 – 2013-05-10 (×2): 5 mg via ORAL
  Filled 2013-05-09 (×4): qty 0.5

## 2013-05-09 MED ORDER — METOPROLOL SUCCINATE ER 25 MG PO TB24
25.0000 mg | ORAL_TABLET | Freq: Every morning | ORAL | Status: DC
  Administered 2013-05-09: 25 mg via ORAL
  Filled 2013-05-09 (×2): qty 1

## 2013-05-09 MED ORDER — HEPARIN (PORCINE) IN NACL 100-0.45 UNIT/ML-% IJ SOLN
1250.0000 [IU]/h | INTRAMUSCULAR | Status: DC
  Filled 2013-05-09: qty 250

## 2013-05-09 NOTE — Progress Notes (Signed)
Patient was setting in bed just finish eating crackers and was trying to go back to sleep and sharpe stabbing pain came on fast. 10/10 Patient holding chest and moaning. Skin warm and dry. ekg done sr 69 on monitor bp 130/70 resp 24. R.n. Into see patient Dr. Norva Pavlov paged and aware see orders for one time dose morphine 2 mg i.v. given

## 2013-05-09 NOTE — H&P (Signed)
History and Physical  Patient ID: Anthony Kelly MRN: [id number], DOB: 01-05-1946 Date of Encounter: 05/09/2013, 3:03 AM Primary Physician: Lysbeth Penner, FNP Primary Cardiologist: Dorris Carnes   Chief Complaint: precordial chest pain  Reason for Admission: chest pain   HPI: 68 yr old male with hx of CAD ( plaque on Cardiac CT ) , SVT , here for chest pain    Pt states that he started experiencing sudden onset of sharp chest pain earlier this morning that was substernal , severe and excruciating in nature. This was worse with inspiration and improved with leaning forward. Pt denies any prior such episodes.  Reports no new medications. Still getting therapy for his lymphoma , no hx of chest radiation though. Pt denies any  SOB , orthopnea, PND , LE edema , Syncope ,claudcation , focal weakness, or bleeding diathesis .      Past Medical History  Diagnosis Date  . Dysrhythmia     SVT sees Dr. Irish Lack had recent EKG at office  . Arthritis   . 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 Metastasis to lymph nodes dx'd 03/2011    lt submandibular ln     Most Recent Cardiac Studies:    Surgical History:  Past Surgical History  Procedure Laterality Date  . Lung lobectomy      right side  . Cholecystectomy    . Lymph node biopsy      in groin with removal  . Meniscus repair      right knee  . Vein ligation and stripping      right leg  . Tonsillectomy      as a child  . Submandibular gland excision  04/2011  . Submandibular gland excision  04/30/2011    Procedure: EXCISION SUBMANDIBULAR GLAND;  Surgeon: Jerrell Belfast, MD;  Location: [facility];  Service: ENT;  Laterality: Left;  WITH  DIAGNOSTIC BIOPSY  . Exploratory laparotomy       Home Meds: Prior to Admission medications   Medication Sig Start Date End Date Taking? Authorizing Provider  acetaminophen (TYLENOL) 500 MG tablet Take 1,000 mg by mouth once as needed for mild pain or moderate pain.   Yes Historical Provider, MD  aspirin EC 81 MG tablet Take 81 mg by mouth every morning.   Yes Historical Provider, MD  atorvastatin (LIPITOR) 10 MG tablet Take 5 mg by mouth at bedtime.  10/04/12  Yes Fay Records, MD  metoprolol succinate (TOPROL-XL) 25 MG 24 hr tablet Take 25 mg by mouth every morning.   Yes Historical Provider, MD  Multiple Vitamin (MULTIVITAMIN) tablet Take 1 tablet by mouth every morning.    Yes Historical Provider, MD    Allergies: No Known Allergies  History   Social History  . Marital Status: Married    Spouse Name: N/A    Number of Children: N/A  . Years of Education: N/A   Occupational History  . Not on file.   Social History Main Topics  . Smoking status: Former Smoker -- 1.50 packs/day for 25 years    Quit date: 01/11/1990  . Smokeless tobacco: Never Used  . Alcohol Use: No  . Drug Use: No  .  Sexual Activity: Not Currently   Other Topics Concern  . Not on file   Social History Narrative  . No narrative on file     Family History  Problem Relation Age of Onset  . Anesthesia problems Neg Hx   . Heart disease Father     Review of Systems: General: negative for chills, fever, night sweats or weight changes.  Cardiovascular: see HPI  Dermatological: negative for rash Respiratory: negative for cough or wheezing Urologic: negative for hematuria Abdominal: negative for nausea, vomiting, diarrhea, bright red blood per rectum, melena, or hematemesis Neurologic: negative for visual changes, syncope, or dizziness All other systems reviewed and are otherwise negative except as noted above.  Labs:   Lab Results  Component Value Date   WBC 10.9* 05/08/2013   HGB 15.7 05/08/2013     HCT 45.2 05/08/2013   MCV 86.9 05/08/2013   PLT 156 05/08/2013    Recent Labs Lab 05/08/13 2230  NA 137  K 4.0  CL 99  CO2 26  BUN 16  CREATININE 0.98  CALCIUM 9.2  PROT 6.8  BILITOT 0.7  ALKPHOS 67  ALT 24  AST 24  GLUCOSE 134*    Recent Labs  05/08/13 2230 05/09/13 0002  TROPONINI <0.30 <0.30   Lab Results  Component Value Date   CHOL 109 01/01/2013   HDL 47 01/01/2013   LDLCALC 40 01/01/2013   TRIG 110 01/01/2013   Lab Results  Component Value Date   DDIMER <0.27 05/08/2013    Radiology/Studies:  Dg Chest 2 View  05/08/2013  IMPRESSION: Mild bibasilar airspace opacities likely reflect atelectasis. Lungs otherwise grossly clear.   Electronically Signed   By: Garald Balding M.D.   On: 05/08/2013 23:56   Ct Angio Chest W/cm &/or Wo Cm  05/09/2013   IMPRESSION: 1. No evidence of aortic dissection. No aneurysmal dilatation seen. Minimal calcific atherosclerotic disease noted. 2. No evidence of pulmonary embolus. 3. Mild emphysematous change at the lung apices. Bibasilar airspace opacification likely reflects atelectasis. 4. Scattered coronary artery calcifications seen. 5. Mildly prominent subcarinal and right hilar nodes seen, measuring up to 1.2 cm. These are of uncertain significance, but could reflect the patient's history of lymphoma. 6. Diffuse retroperitoneal haziness at the upper abdomen, thought to reflect the patient's history of lymphoma. 1.4 cm node seen adjacent to the left adrenal gland. Scattered smaller peripancreatic nodes seen. 7. Moderate hiatal hernia noted. Mild apparent wall thickening along the body of the stomach is thought to reflect intraluminal contents. 8. Possible small hepatic cyst noted.   Electronically Signed   By: Garald Balding M.D.   On: 05/09/2013 00:55     [id number] NSR, with diffuse j pint elevation suggestive of acute pericarditis   Physical Exam: Blood pressure 106/74, pulse 66, temperature 98.4 F (36.9 C), temperature  source Oral, resp. rate 16, height 6' (1.829 m), weight 81.647 kg (180 lb), SpO2 91.00%. General: Well developed, well nourished, in no acute distress. Head: Normocephalic, atraumatic, sclera non-icteric, no xanthomas, nares are without discharge.  Neck: Negative for carotid bruits. JVD not elevated. Lungs: Clear bilaterally to auscultation without wheezes, rales, or rhonchi. Breathing is unlabored. Heart: RRR with S1 S2. No murmurs, rubs, or gallops appreciated. Abdomen: Soft, non-tender, non-distended with normoactive bowel sounds. No hepatomegaly. No rebound/guarding. No obvious abdominal masses. Msk:  Strength and tone appear normal for age. Extremities: No clubbing or cyanosis. No edema.  Distal pedal pulses are 2+ and equal bilaterally. Neuro: Alert and oriented X  3. No focal deficit. No facial asymmetry. Moves all extremities spontaneously. Psych:  Responds to questions appropriately with a normal affect.    ASSESSMENT AND PLAN:  Acute pericarditis  Hx of SVT   Plan  -Start ibuprofen 800mg  TID with colchicine 0.6mg  BID  -Monitor on tele - Obtain echo in am, evaluate for effusion and LV function   Signed, Grafton Folk M.D  05/09/2013, 3:03 AM

## 2013-05-09 NOTE — Progress Notes (Signed)
UR Completed.  Anthony Kelly Anthony Kelly [phone] 05/09/2013  

## 2013-05-09 NOTE — Progress Notes (Signed)
Called by RN - pt had converted to  Atrial fib with RVR.  HR up to 140, pt without chest pain or SOB.  He is not aware of rapid HR.  He has had skipped beats in the past and chart reveals SVT but no atrial fib.  He is on IV heparin which I will continue.  He had no effusion on echo.  I am beginning IV dilt with bolus and drip.  Pt may need ischemic workup CT of chest with scattered coronary artery calcifications noted .  MD to see in AM for further recommendations.  Pt understands need for cardizem drip.  I talked to both pt and his wife.

## 2013-05-09 NOTE — Progress Notes (Addendum)
Patient seen and examined and agree with Consult note by Dr. Delane Ginger  Admitted with pleuritic CP with diffuse ST elevation c/w acute pericarditis.  Cardiac enzymes are normal.  Chest CT with no PE.  He does have coronary artery calcifications on chest CT and new inferior Q waves in III and aVF.  Will get an echo to assess for LVF, pericardia effusion and RWMA's.  Continue current treatment with Ibuprofen.  Check sed rate and CRP.  He was placed on Heparin which I will stop since his most likely diagnosis is pericarditis.

## 2013-05-09 NOTE — Progress Notes (Addendum)
ANTICOAGULATION CONSULT NOTE - Initial Consult  Pharmacy Consult for Heparin  Indication: chest pain/ACS  No Known Allergies  Patient Measurements: Height: 6' (182.9 cm) Weight: 180 lb (81.647 kg) IBW/kg (Calculated) : 77.6  Vital Signs: Temp: 98.2 F (36.8 C) (04/29 0304) Temp src: Oral (04/29 0304) BP: 124/82 mmHg (04/29 0304) Pulse Rate: 72 (04/29 0304)  Labs:  Recent Labs  05/08/13 2230 05/09/13 0002  HGB 15.7  --   HCT 45.2  --   PLT 156  --   APTT 30  --   LABPROT 14.4  --   INR 1.14  --   CREATININE 0.98  --   TROPONINI <0.30 <0.30    Estimated Creatinine Clearance: 79.2 ml/min (by C-G formula based on Cr of 0.98).   Medical History: Past Medical History  Diagnosis Date  . Dysrhythmia     SVT sees Dr. Irish Lack had recent EKG at office  . Arthritis   . 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 Metastasis to lymph nodes dx'd 03/2011    lt submandibular ln    Assessment: 68 y/o M tx from [facility] for CP with heparin running at 1000 units/hr. Labs as above.   Goal of Therapy:  Heparin level 0.3-0.7 units/ml Monitor platelets by anticoagulation protocol: Yes   Plan:  -Continue heparin at 1000 units/hr -Check HL at 0800 -Daily CBC/HL -Monitor for bleeding  Rei Contee 05/09/2013,4:40 AM  Addendum 7:43 AM First HL 0.15 -Increase heparin drip to 1250 units/hr -1500 HL JL[place]ford, PharmD

## 2013-05-09 NOTE — Progress Notes (Signed)
Pain down to 2/10

## 2013-05-09 NOTE — Progress Notes (Signed)
  Echocardiogram 2D Echocardiogram has been performed.  Anthony Kelly 05/09/2013, 10:14 AM

## 2013-05-10 DIAGNOSIS — I309 Acute pericarditis, unspecified: Secondary | ICD-10-CM | POA: Diagnosis not present

## 2013-05-10 DIAGNOSIS — I2510 Atherosclerotic heart disease of native coronary artery without angina pectoris: Secondary | ICD-10-CM | POA: Diagnosis not present

## 2013-05-10 DIAGNOSIS — Z8249 Family history of ischemic heart disease and other diseases of the circulatory system: Secondary | ICD-10-CM

## 2013-05-10 DIAGNOSIS — Z8679 Personal history of other diseases of the circulatory system: Secondary | ICD-10-CM

## 2013-05-10 DIAGNOSIS — I4891 Unspecified atrial fibrillation: Secondary | ICD-10-CM

## 2013-05-10 DIAGNOSIS — I480 Paroxysmal atrial fibrillation: Secondary | ICD-10-CM | POA: Diagnosis not present

## 2013-05-10 DIAGNOSIS — R079 Chest pain, unspecified: Secondary | ICD-10-CM | POA: Diagnosis not present

## 2013-05-10 LAB — BASIC METABOLIC PANEL
BUN: 14 mg/dL (ref 6–23)
CALCIUM: 8.6 mg/dL (ref 8.4–10.5)
CO2: 18 meq/L — AB (ref 19–32)
Chloride: 105 mEq/L (ref 96–112)
Creatinine, Ser: 1.02 mg/dL (ref 0.50–1.35)
GFR calc Af Amer: 85 mL/min — ABNORMAL LOW (ref >90)
GFR, EST NON AFRICAN AMERICAN: 73 mL/min — AB (ref >90)
GLUCOSE: 103 mg/dL — AB (ref 70–99)
Potassium: 5 mEq/L (ref 3.7–5.3)
Sodium: 139 mEq/L (ref 137–147)

## 2013-05-10 LAB — HEPARIN LEVEL (UNFRACTIONATED): Heparin Unfractionated: <0.1 IU/mL — ABNORMAL LOW (ref 0.30–0.70)

## 2013-05-10 LAB — CBC
HCT: 44.1 % (ref 39.0–52.0)
HEMOGLOBIN: 15 g/dL (ref 13.0–17.0)
MCH: 30.4 pg (ref 26.0–34.0)
MCHC: 34 g/dL (ref 30.0–36.0)
MCV: 89.5 fL (ref 78.0–100.0)
Platelets: 135 10*3/uL — ABNORMAL LOW (ref 150–400)
RBC: 4.93 MIL/uL (ref 4.22–5.81)
RDW: 13.4 % (ref 11.5–15.5)
WBC: 9.2 10*3/uL (ref 4.0–10.5)

## 2013-05-10 LAB — MAGNESIUM: Magnesium: 2.1 mg/dL (ref 1.5–2.5)

## 2013-05-10 LAB — TSH: TSH: 0.957 u[IU]/mL (ref 0.350–4.500)

## 2013-05-10 LAB — T4, FREE: Free T4: 1.17 ng/dL (ref 0.80–1.80)

## 2013-05-10 MED ORDER — ALUM & MAG HYDROXIDE-SIMETH 200-200-20 MG/5ML PO SUSP
30.0000 mL | ORAL | Status: DC | PRN
Start: 2013-05-10 — End: 2013-05-11
  Administered 2013-05-10 (×2): 30 mL via ORAL
  Filled 2013-05-10 (×2): qty 30

## 2013-05-10 MED ORDER — METOPROLOL SUCCINATE ER 25 MG PO TB24
25.0000 mg | ORAL_TABLET | Freq: Every morning | ORAL | Status: DC
Start: 2013-05-10 — End: 2013-05-11
  Administered 2013-05-10 – 2013-05-11 (×2): 25 mg via ORAL
  Filled 2013-05-10 (×2): qty 1

## 2013-05-10 MED ORDER — PANTOPRAZOLE SODIUM 40 MG PO TBEC
40.0000 mg | DELAYED_RELEASE_TABLET | Freq: Every day | ORAL | Status: DC
  Administered 2013-05-11: 40 mg via ORAL
  Filled 2013-05-10: qty 1

## 2013-05-10 MED ORDER — METOPROLOL SUCCINATE ER 50 MG PO TB24
50.0000 mg | ORAL_TABLET | Freq: Every morning | ORAL | Status: DC
  Filled 2013-05-10: qty 1

## 2013-05-10 MED ORDER — DILTIAZEM HCL ER COATED BEADS 120 MG PO CP24
120.0000 mg | ORAL_CAPSULE | Freq: Every day | ORAL | Status: DC
  Administered 2013-05-10 – 2013-05-11 (×2): 120 mg via ORAL
  Filled 2013-05-10 (×2): qty 1

## 2013-05-10 NOTE — Progress Notes (Signed)
    Subjective:  Chest pain has resolved  Objective:  Vital Signs in the last 24 hours: Temp:  [97.7 F (36.5 C)-98.3 F (36.8 C)] 97.7 F (36.5 C) (04/30 0449) Pulse Rate:  [60-89] 82 (04/30 0449) Resp:  [18-20] 18 (04/30 0449) BP: (100-111)/(56-70) 101/56 mmHg (04/30 0449) SpO2:  [92 %-96 %] 96 % (04/30 0449)  Intake/Output from previous day:  Intake/Output Summary (Last 24 hours) at 05/10/13 0826 Last data filed at 05/10/13 0646  Gross per 24 hour  Intake    410 ml  Output    900 ml  Net   -490 ml    Physical Exam: General appearance: alert, cooperative and no distress Lungs: clear to auscultation bilaterally Heart: regular rate and rhythm and no rub   Rate: 82  Rhythm: normal sinus rhythm and runs of PAF with RVR  Lab Results:  Recent Labs  05/09/13 0713 05/10/13 0449  WBC 9.2 9.2  HGB 14.6 15.0  PLT 140* 135*    Recent Labs  05/09/13 0550 05/10/13 0449  NA 141 139  K 4.4 5.0  CL 104 105  CO2 24 18*  GLUCOSE 127* 103*  BUN 14 14  CREATININE 0.95 1.02    Recent Labs  05/08/13 2230 05/09/13 0002  TROPONINI <0.30 <0.30    Recent Labs  05/08/13 2230  INR 1.14    Imaging: Imaging results have been reviewed CTA of chest shows scatter coronary Ca++  Cardiac Studies: Echo 05/09/13- NL LVF, no WMA, no pericardial effusion, NL LA size Hx of remote Nuclear stress test  Assessment/Plan:   Principal Problem:   Chest pain Active Problems:   Acute pericarditis- ST elavation on EKG   PAF (paroxysmal atrial fibrillation)   Malignant lymphoma, high grade   History of PSVT (paroxysmal supraventricular tachycardia)   Family history of coronary artery disease    PLAN:  He is on IV Diltiazem started last pm and he is now in NSR. Sed Rate was 2, Troponin negative X 2.  He is on Advil 800 mg TID for pericarditis. Add PPI, change O2 to prn, increase Beta blocker and change IV Diltiazem to po later today (B/P borderline low). CHA2DS2-VASc is ?-1  (with coronary Ca++ on CT scan)-    Kerin Ransom PA-C Beeper [phone] 05/10/2013, 8:26 AM

## 2013-05-10 NOTE — Care Management Note (Unsigned)
    Page 1 of 1   05/10/2013     2:58:30 PM CARE MANAGEMENT NOTE 05/10/2013  Patient:  Anthony Kelly, Anthony Kelly   Account Number:  [id number]  Date Initiated:  05/10/2013  Documentation initiated by:  Denisia Harpole  Subjective/Objective Assessment:   Pt adm on 05/08/13 with atrial fib.  PTA, pt resides at home with spouse.     Action/Plan:   Will follow for dc needs as pt progresses.   Anticipated DC Date:  05/12/2013   Anticipated DC Plan:  [place]  CM consult      Choice offered to / List presented to:             Status of service:  In process, will continue to follow Medicare Important Message given?   (If response is "NO", the following Medicare IM given date fields will be blank) Date Medicare IM given:   Date Additional Medicare IM given:    Discharge Disposition:    Per UR Regulation:  Reviewed for med. necessity/level of care/duration of stay  If discussed at [place] of Stay Meetings, dates discussed:    Comments:

## 2013-05-10 NOTE — Progress Notes (Addendum)
Patient seen and examined.  Agree with  Note as outlined by Kerin Ransom, PA-C.  He had PAF with RVR last night which converted to NSR on IV Cardizem.  Will change to Cardizem CD 120mg  daily today.  Will ambulate in hall today to make sure no further arrhythmias or CP.  His CP has resolved on Ibuprofen.  He had coronary artery calcifications on chest CT so will get Lexiscan myoview to rule out ischemia.  Probable d/c home tomorrow after stress test.  Will need 30 day heart monitor to assess for silent PAF.  His CHADs2 Vasc score is 1.  Since he is back in NSR will not anticoagulate.  His heparin gtt was stopped yesterday due to pericarditis. Will keep patient on Metoprolol at 25mg  daily for now and add Cardizem PO since BP soft.

## 2013-05-11 ENCOUNTER — Encounter (HOSPITAL_COMMUNITY): Payer: Self-pay | Admitting: Physician Assistant

## 2013-05-11 ENCOUNTER — Inpatient Hospital Stay (HOSPITAL_COMMUNITY): Payer: Medicare Other

## 2013-05-11 DIAGNOSIS — R079 Chest pain, unspecified: Secondary | ICD-10-CM

## 2013-05-11 DIAGNOSIS — I4891 Unspecified atrial fibrillation: Secondary | ICD-10-CM | POA: Diagnosis not present

## 2013-05-11 DIAGNOSIS — I309 Acute pericarditis, unspecified: Secondary | ICD-10-CM | POA: Diagnosis not present

## 2013-05-11 DIAGNOSIS — I2510 Atherosclerotic heart disease of native coronary artery without angina pectoris: Secondary | ICD-10-CM | POA: Diagnosis not present

## 2013-05-11 LAB — CBC
HCT: 43.6 % (ref 39.0–52.0)
Hemoglobin: 15 g/dL (ref 13.0–17.0)
MCH: 30.4 pg (ref 26.0–34.0)
MCHC: 34.4 g/dL (ref 30.0–36.0)
MCV: 88.3 fL (ref 78.0–100.0)
Platelets: 162 10*3/uL (ref 150–400)
RBC: 4.94 MIL/uL (ref 4.22–5.81)
RDW: 13.4 % (ref 11.5–15.5)
WBC: 9.8 10*3/uL (ref 4.0–10.5)

## 2013-05-11 MED ORDER — REGADENOSON 0.4 MG/5ML IV SOLN
0.4000 mg | Freq: Once | INTRAVENOUS | Status: AC
  Administered 2013-05-11: 0.4 mg via INTRAVENOUS
  Filled 2013-05-11: qty 5

## 2013-05-11 MED ORDER — COLCHICINE 0.6 MG PO TABS: 0.6000 mg | ORAL_TABLET | Freq: Two times a day (BID) | ORAL | Status: DC

## 2013-05-11 MED ORDER — TECHNETIUM TC 99M SESTAMIBI GENERIC - CARDIOLITE
10.0000 | Freq: Once | INTRAVENOUS | Status: AC | PRN
  Administered 2013-05-11: 10 via INTRAVENOUS

## 2013-05-11 MED ORDER — DILTIAZEM HCL ER COATED BEADS 120 MG PO CP24: 120.0000 mg | ORAL_CAPSULE | Freq: Every day | ORAL | Status: DC

## 2013-05-11 MED ORDER — ACETAMINOPHEN 500 MG PO TABS: 500.0000 mg | ORAL_TABLET | Freq: Four times a day (QID) | ORAL | Status: DC | PRN

## 2013-05-11 MED ORDER — IBUPROFEN 800 MG PO TABS: 800.0000 mg | ORAL_TABLET | Freq: Three times a day (TID) | ORAL | Status: DC

## 2013-05-11 MED ORDER — OMEPRAZOLE 20 MG PO CPDR: 20.0000 mg | DELAYED_RELEASE_CAPSULE | Freq: Every day | ORAL | Status: DC

## 2013-05-11 MED ORDER — TECHNETIUM TC 99M TETROFOSMIN IV KIT
30.0000 | PACK | Freq: Once | INTRAVENOUS | Status: AC | PRN
  Administered 2013-05-11: 30 via INTRAVENOUS

## 2013-05-11 MED ORDER — REGADENOSON 0.4 MG/5ML IV SOLN
INTRAVENOUS | Status: AC
  Filled 2013-05-11: qty 5

## 2013-05-11 NOTE — Progress Notes (Signed)
LEXISCAN myoview completed without complications -freq PACs, no chest pain, nuc results to follow.

## 2013-05-11 NOTE — Discharge Summary (Signed)
Discharge Summary   Patient ID: Anthony Kelly MRN: [id number], DOB/AGE: 12-Jul-1945 68 y.o. Admit date: 05/08/2013 D/C date:     05/11/2013  Primary Care Provider: Lysbeth Penner, [place] Primary Cardiologist: [facility] Oncologist - Alvy Bimler (previously [place])  Primary Discharge Diagnoses:  1. Acute pericarditis 2. Transient paroxysmal atrial fibrillation in setting of acute pericarditis, spontaneously converted 3. H/o coronary artery calcifications by CT - neg nuc this admission  Secondary Discharge Diagnoses:  1. 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 History of PSVT 3. Arthritis  Hospital Course: Anthony Kelly is a 68 y/o M with history of PSVT, coronary artery calcification on prior cardiac CT who presented to [facility]08[facility]2013 with sudden onset of CP. This began the morning of admission that was substernal, severe and excruciating in nature. This was worse with inspiration and improved with leaning forward. He denied any prior episodes or new medications. He is still getting therapy for lymphoma but no history of chest radiation. He denied any SOB, orthopnea, PND, LE edema, syncope, claudication, focal weakness, or bleeding diathesis. EKG showed NSR with diffuse J point elevation suggestive of acute pericarditis. He was started on ibuprofen and colchicine.  He was also placed on IV heparin while MI was ruled out - troponins remained negative. CT angio did not show any evidence of PE (see below for ancillary findings that may be reflective of patient's lymphoma). 2D Echo showed EF 55-60%, normal WM, normal diastolic function. CRP was elevated but ESR was negative. Thyroid function was normal. During this admission he had an episode of atrial fibrillation which was treated with IV diltiazem then changed to oral diltiazem after spontaneous conversion to NSR. CHADSVASC score is felt to be 1 for age at this time. He was not placed n  anticoagulation at this time. Ideally we would like to send out on 30 day monitor to assess for silent PAF, but he has a severe allergy to EKG pads (even the sensitive skin ones) and he said he has never been able to wear a monitor for more than a week. Dr. Radford Pax will defer the final decision to Dr. Harrington Challenger as an [place]. For completeness the patient underwent Lexiscan cardiolite stress testing which was normal - allowing for attenuation artifact, there are nor perfusion defects and there was no evidence of ischemia, EF 71%. He feels well. Dr. Radford Pax has seen and examined the patient today and feels he is stable for discharge. The plan is to continue ibuprofen until cleared to discontinue this at followup appointment if asymptomatic at that time, and to continue colchicine for 3 months. We have also added PPI while taking ibuprofen.  We have informed pt that we will be sending copy of CT to his oncologist in case further f/u is warranted.  Addendum: Dr. Alvy Bimler sent me this message in Epic: "I have reviewed this I do not feel strongly he needs to be seen sooner that his scheduled appt next month Thank you for the update"  Discharge Vitals: Blood pressure 138/69, pulse 100, temperature 98.2 F (36.8 C), temperature source Oral, resp. rate 16, height 6' (1.829 m), weight 180 lb (81.647 kg), SpO2 97.00%.  Labs: Lab Results  Component Value Date   WBC 9.8 05/11/2013   HGB 15.0 05/11/2013   HCT 43.6 05/11/2013   MCV 88.3 05/11/2013   PLT 162 05/11/2013    Recent Labs Lab 05/08/13 2230  05/10/13 0449  NA 137  < > 139  K 4.0  < > 5.0  CL 99  < > 105  CO2 26  < > 18*  BUN 16  < > 14  CREATININE 0.98  < > 1.02  CALCIUM 9.2  < > 8.6  PROT 6.8  --   --   BILITOT 0.7  --   --   ALKPHOS 67  --   --   ALT 24  --   --   AST 24  --   --   GLUCOSE 134*  < > 103*  < > = values in this interval not displayed.  Recent Labs  05/08/13 2230 05/09/13 0002  TROPONINI <0.30 <0.30   Lab Results  Component Value  Date   CHOL 109 01/01/2013   HDL 47 01/01/2013   LDLCALC 40 01/01/2013   TRIG 110 01/01/2013   Lab Results  Component Value Date   DDIMER <0.27 05/08/2013    Diagnostic Studies/Procedures   Dg Chest 2 View  05/08/2013   CLINICAL DATA:  Severe chest pain. History of non-Hodgkin's lymphoma with lung metastases.  EXAM: CHEST  2 VIEW  COMPARISON:  Chest radiograph performed 04/26/2011, and CT of the chest performed 02/13/2013  FINDINGS: The lungs are well-aerated. Mild bibasilar airspace opacities likely reflect atelectasis. There is no evidence of pleural effusion or pneumothorax.  The heart is normal in size; the mediastinal contour is within normal limits. No acute osseous abnormalities are seen. Clips are noted within the right upper quadrant, reflecting prior cholecystectomy.  IMPRESSION: Mild bibasilar airspace opacities likely reflect atelectasis. Lungs otherwise grossly clear.   Electronically Signed   By: Garald Balding M.D.   On: 05/08/2013 23:56   Ct Angio Chest W/cm &/or Wo Cm  05/09/2013   CLINICAL DATA:  Chest pain. Diaphoresis and nausea. 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 Assess for dissection.  EXAM: CT ANGIOGRAPHY CHEST WITH CONTRAST  TECHNIQUE: Multidetector CT imaging of the chest was performed using the standard protocol during bolus administration of intravenous contrast. Multiplanar CT image reconstructions and MIPs were obtained to evaluate the vascular anatomy.  CONTRAST:  114m OMNIPAQUE IOHEXOL 350 MG/ML SOLN  COMPARISON:  CT of the chest performed 02/13/2013  FINDINGS: There is no evidence of aortic dissection. No aneurysmal dilatation is seen. Minimal calcific atherosclerotic disease is seen, without significant mural thrombus or luminal narrowing.  There is no evidence of pulmonary embolus.  Mild emphysematous change is noted at the lung apices. Bibasilar airspace opacification likely reflects atelectasis. Postoperative change is noted at the right  hilum. There is no evidence of pleural effusion or pneumothorax. No masses are identified; no abnormal focal contrast enhancement is seen.  Scattered coronary artery calcifications are seen. A few mildly prominent nodes are seen, measuring 1.2 cm in the subcarinal region and 1.1 cm at the right hilum. These are of uncertain significance, but could reflect the patient's history of lymphoma. No pericardial effusion is identified. The great vessels are unremarkable in appearance. No axillary lymphadenopathy is seen. The thyroid gland is unremarkable in appearance.  There is diffuse prominence of the intrahepatic biliary ducts and common hepatic duct, likely reflecting prior cholecystectomy. A 1.1 cm hypodensity within the left hepatic lobe is nonspecific but may reflect a small cyst. The visualized portions of the spleen are unremarkable. Note is made of a moderate hiatal hernia. Mild apparent wall thickening along the body of the stomach is thought to reflect intraluminal contents.  There is diffuse retroperitoneal haziness, thought to reflect the  patient's history of lymphoma. A 1.4 cm node is seen adjacent to the left adrenal gland. Scattered smaller peripancreatic nodes are seen.  No acute osseous abnormalities are seen. Degenerative change is noted at the lower cervical spine.  Review of the MIP images confirms the above findings.  IMPRESSION: 1. No evidence of aortic dissection. No aneurysmal dilatation seen. Minimal calcific atherosclerotic disease noted. 2. No evidence of pulmonary embolus. 3. Mild emphysematous change at the lung apices. Bibasilar airspace opacification likely reflects atelectasis. 4. Scattered coronary artery calcifications seen. 5. Mildly prominent subcarinal and right hilar nodes seen, measuring up to 1.2 cm. These are of uncertain significance, but could reflect the patient's history of lymphoma. 6. Diffuse retroperitoneal haziness at the upper abdomen, thought to reflect the patient's  history of lymphoma. 1.4 cm node seen adjacent to the left adrenal gland. Scattered smaller peripancreatic nodes seen. 7. Moderate hiatal hernia noted. Mild apparent wall thickening along the body of the stomach is thought to reflect intraluminal contents. 8. Possible small hepatic cyst noted.   Electronically Signed   By: Garald Balding M.D.   On: 05/09/2013 00:55   Nm Myocar Multi W/spect W/wall Motion / Ef  05/11/2013   CLINICAL DATA:  Chest pain  EXAM: MYOCARDIAL IMAGING WITH SPECT (REST AND PHARMACOLOGIC-STRESS)  GATED LEFT VENTRICULAR WALL MOTION STUDY  LEFT VENTRICULAR EJECTION FRACTION  TECHNIQUE: Standard myocardial SPECT imaging was performed after resting intravenous injection of 10 mCi [id number]. Subsequently, intravenous infusion of Lexiscan was performed under the supervision of the Cardiology staff. At peak effect of the drug, 30 mCi [id number] was injected intravenously and standard myocardial SPECT imaging was performed. Quantitative gated imaging was also performed to evaluate left ventricular wall motion, and estimate left ventricular ejection fraction.  COMPARISON:  None.  FINDINGS: SPECT:  Inferior wall attenuation artifact.  No perfusion defects.  Wall motion:  Normal.  Ejection fraction: 71%. End-diastolic volume 71 cc. End systolic volume 21 cc.  IMPRESSION: Allowing for attenuation artifact, there are nor perfusion defects and there is no evidence of ischemia.   Electronically Signed   By: ArMaryclare Bean.D.   On: 05/11/2013 13:40   2D echo 05/09/13 Left ventricle: The cavity size was normal. Systolic function was normal. The estimated ejection fraction was in the range of 55% to 60%. Wall motion was normal; there were no regional wall motion abnormalities. Left ventricular diastolic function parameters were normal.   Discharge Medications   Current Discharge Medication List    START taking these medications   Details  colchicine 0.6 MG tablet Take 1 tablet (0.6 mg  total) by mouth 2 (two) times daily. For 3 months. Qty: 60 tablet, Refills: 2    diltiazem (CARDIZEM CD) 120 MG 24 hr capsule Take 1 capsule (120 mg total) by mouth daily. Qty: 30 capsule, Refills: 6    ibuprofen (ADVIL,MOTRIN) 800 MG tablet Take 1 tablet (800 mg total) by mouth 3 (three) times daily. Take with food. Qty: 35 tablet, Refills: 1    omeprazole (PRILOSEC) 20 MG capsule Take 1 capsule (20 mg total) by mouth daily. While on ibuprofen. Qty: 30 capsule, Refills: 0      CONTINUE these medications which have CHANGED   Details  acetaminophen (TYLENOL) 500 MG tablet Take 1-2 tablets (500-1,000 mg total) by mouth every 6 (six) hours as needed for mild pain or moderate pain.      CONTINUE these medications which have NOT CHANGED   Details  aspirin EC 81 MG  tablet Take 81 mg by mouth every morning.    atorvastatin (LIPITOR) 10 MG tablet Take 5 mg by mouth at bedtime.    Associated Diagnoses: Malignant lymphoma, high grade; Low grade B-cell lymphoma    metoprolol succinate (TOPROL-XL) 25 MG 24 hr tablet Take 25 mg by mouth every morning.    Multiple Vitamin (MULTIVITAMIN) tablet Take 1 tablet by mouth every morning.         Disposition   The patient will be discharged in stable condition to home. Discharge Orders   Future Appointments Provider Department Dept Phone   05/21/2013 2:10 PM Lendon Colonel, NP [facility] [place] [phone]   06/11/2013 8:00 AM [facility] [phone]   06/11/2013 8:30 AM [facility] [phone]   Please pick up your oral contrast prep at your scheduled location at least 1 day prior to your appointment. CT Pelvis to eval bone or for kidney stones: No oral contrast prep needed. Please arrive 15 min prior to your scheduled exam time.   06/14/2013 9:15 AM [name], MD [facility] [phone]   06/25/2013 8:30 AM Fay Records, MD [facility] [phone]   Future Orders Complete By Expires   Diet - low sodium heart healthy  As directed    Increase activity slowly  As directed    Scheduling Instructions:   The plan is for you to continue ibuprofen until told to discontinue by your provider at your followup appointment. Colchicine should be continued for 3 months to prevent your pericarditis from coming back.     Follow-up Information   Follow up with Jory Sims, NP. (05/21/13 at 2:10pm - [facility] office)    Specialty:  Nurse Practitioner   Contact information:   [address] [place] [address] [phone]       Follow up with [facility], NI, MD. (We will send a copy of your CT to your oncologist and primary care doctor. We have asked them to contact you if any further followup is needed.)    Specialty:  Hematology and Oncology   Contact information:   [place] [address] [phone]         Duration of Discharge Encounter: Greater than 30 minutes including physician and PA time.  Signed, Nedra Hai Malakye Nolden PA-C 05/11/2013, 3:15 PM

## 2013-05-11 NOTE — Progress Notes (Addendum)
SUBJECTIVE:  No further CP  OBJECTIVE:   Vitals:   Filed Vitals:   05/10/13 0449 05/10/13 1500 05/10/13 2114 05/11/13 0500  BP: 101/56 104/66 141/83 118/68  Pulse: 82 74 75 85  Temp: 97.7 F (36.5 C) 98.4 F (36.9 C) 99 F (37.2 C) 98.2 F (36.8 C)  TempSrc: Oral Oral Oral Oral  Resp: 18 18 18 18   Height:      Weight:      SpO2: 96% 95% 95% 97%   I&O's:   Intake/Output Summary (Last 24 hours) at 05/11/13 0749 Last data filed at 05/10/13 1500  Gross per 24 hour  Intake    600 ml  Output      0 ml  Net    600 ml   TELEMETRY: Reviewed telemetry pt in :NSR     PHYSICAL EXAM General: Well developed, well nourished, in no acute distress Head: Eyes PERRLA, No xanthomas.   Normal cephalic and atramatic  Lungs:   Clear bilaterally to auscultation and percussion. Heart:   HRRR S1 S2 Pulses are 2+ & equal. Abdomen: Bowel sounds are positive, abdomen soft and non-tender without masses Extremities:   No clubbing, cyanosis or edema.  DP +1 Neuro: Alert and oriented X 3. Psych:  Good affect, responds appropriately   LABS: Basic Metabolic Panel:  Recent Labs  05/09/13 0550 05/10/13 0449  NA 141 139  K 4.4 5.0  CL 104 105  CO2 24 18*  GLUCOSE 127* 103*  BUN 14 14  CREATININE 0.95 1.02  CALCIUM 8.7 8.6  MG  --  2.1   Liver Function Tests:  Recent Labs  05/08/13 2230  AST 24  ALT 24  ALKPHOS 67  BILITOT 0.7  PROT 6.8  ALBUMIN 3.9   No results found for this basename: LIPASE, AMYLASE,  in the last 72 hours CBC:  Recent Labs  05/10/13 0449 05/11/13 0415  WBC 9.2 9.8  HGB 15.0 15.0  HCT 44.1 43.6  MCV 89.5 88.3  PLT 135* 162   Cardiac Enzymes:  Recent Labs  05/08/13 2230 05/09/13 0002  TROPONINI <0.30 <0.30   BNP: No components found with this basename: POCBNP,  D-Dimer:  Recent Labs  05/08/13 2230  DDIMER <0.27   Hemoglobin A1C: No results found for this basename: HGBA1C,  in the last 72 hours Fasting Lipid Panel: No results  found for this basename: CHOL, HDL, LDLCALC, TRIG, CHOLHDL, LDLDIRECT,  in the last 72 hours Thyroid Function Tests:  Recent Labs  05/10/13 0449  TSH 0.957   Anemia Panel: No results found for this basename: VITAMINB12, FOLATE, FERRITIN, TIBC, IRON, RETICCTPCT,  in the last 72 hours Coag Panel:   Lab Results  Component Value Date   INR 1.14 05/08/2013   INR 1.0 12/15/2006    RADIOLOGY: Dg Chest 2 View  05/08/2013   CLINICAL DATA:  Severe chest pain. History of non-Hodgkin's lymphoma with lung metastases.  EXAM: CHEST  2 VIEW  COMPARISON:  Chest radiograph performed 04/26/2011, and CT of the chest performed 02/13/2013  FINDINGS: The lungs are well-aerated. Mild bibasilar airspace opacities likely reflect atelectasis. There is no evidence of pleural effusion or pneumothorax.  The heart is normal in size; the mediastinal contour is within normal limits. No acute osseous abnormalities are seen. Clips are noted within the right upper quadrant, reflecting prior cholecystectomy.  IMPRESSION: Mild bibasilar airspace opacities likely reflect atelectasis. Lungs otherwise grossly clear.   Electronically Signed   By: Francoise Schaumann.D.  On: 05/08/2013 23:56   Ct Angio Chest W/cm &/or Wo Cm  05/09/2013   CLINICAL DATA:  Chest pain. Diaphoresis and nausea. 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 Assess for dissection.  EXAM: CT ANGIOGRAPHY CHEST WITH CONTRAST  TECHNIQUE: Multidetector CT imaging of the chest was performed using the standard protocol during bolus administration of intravenous contrast. Multiplanar CT image reconstructions and MIPs were obtained to evaluate the vascular anatomy.  CONTRAST:  154mL OMNIPAQUE IOHEXOL 350 MG/ML SOLN  COMPARISON:  CT of the chest performed 02/13/2013  FINDINGS: There is no evidence of aortic dissection. No aneurysmal dilatation is seen. Minimal calcific atherosclerotic disease is seen, without significant mural thrombus or luminal narrowing.  There  is no evidence of pulmonary embolus.  Mild emphysematous change is noted at the lung apices. Bibasilar airspace opacification likely reflects atelectasis. Postoperative change is noted at the right hilum. There is no evidence of pleural effusion or pneumothorax. No masses are identified; no abnormal focal contrast enhancement is seen.  Scattered coronary artery calcifications are seen. A few mildly prominent nodes are seen, measuring 1.2 cm in the subcarinal region and 1.1 cm at the right hilum. These are of uncertain significance, but could reflect the patient's history of lymphoma. No pericardial effusion is identified. The great vessels are unremarkable in appearance. No axillary lymphadenopathy is seen. The thyroid gland is unremarkable in appearance.  There is diffuse prominence of the intrahepatic biliary ducts and common hepatic duct, likely reflecting prior cholecystectomy. A 1.1 cm hypodensity within the left hepatic lobe is nonspecific but may reflect a small cyst. The visualized portions of the spleen are unremarkable. Note is made of a moderate hiatal hernia. Mild apparent wall thickening along the body of the stomach is thought to reflect intraluminal contents.  There is diffuse retroperitoneal haziness, thought to reflect the patient's history of lymphoma. A 1.4 cm node is seen adjacent to the left adrenal gland. Scattered smaller peripancreatic nodes are seen.  No acute osseous abnormalities are seen. Degenerative change is noted at the lower cervical spine.  Review of the MIP images confirms the above findings.  IMPRESSION: 1. No evidence of aortic dissection. No aneurysmal dilatation seen. Minimal calcific atherosclerotic disease noted. 2. No evidence of pulmonary embolus. 3. Mild emphysematous change at the lung apices. Bibasilar airspace opacification likely reflects atelectasis. 4. Scattered coronary artery calcifications seen. 5. Mildly prominent subcarinal and right hilar nodes seen, measuring  up to 1.2 cm. These are of uncertain significance, but could reflect the patient's history of lymphoma. 6. Diffuse retroperitoneal haziness at the upper abdomen, thought to reflect the patient's history of lymphoma. 1.4 cm node seen adjacent to the left adrenal gland. Scattered smaller peripancreatic nodes seen. 7. Moderate hiatal hernia noted. Mild apparent wall thickening along the body of the stomach is thought to reflect intraluminal contents. 8. Possible small hepatic cyst noted.   Electronically Signed   By: Garald Balding M.D.   On: 05/09/2013 00:55    Assessment/Plan:  Principal Problem:  Chest pain - normal cardiac enzymes- secondary to pericarditis and resolved on Ibuprofen/Colchicine.  Plan Lexiscan myoview this am and if no ischemia then d/c home. Active Problems:  Acute pericarditis PAF (paroxysmal atrial fibrillation) - one episode in hospital in setting of acute pericarditis with no reoccurrence Malignant lymphoma, high grade  History of PSVT (paroxysmal supraventricular tachycardia)  Family history of coronary artery disease with coronary artery calcifications on chest CT  Would like to send out on a 30 day  monitor to assess for silent PAF but he has a severe allergy to EKG pads (even the sensitive skin ones) and says he has never been able to wear a monitor for more than a week.  Will leave final decision to Dr. Harrington Challenger as an outpt.   Sueanne Margarita, MD  05/11/2013  7:49 AM

## 2013-05-14 ENCOUNTER — Other Ambulatory Visit: Payer: Self-pay | Admitting: Hematology and Oncology

## 2013-05-14 ENCOUNTER — Other Ambulatory Visit: Payer: Self-pay

## 2013-05-14 ENCOUNTER — Telehealth: Payer: Self-pay | Admitting: *Deleted

## 2013-05-14 DIAGNOSIS — C8590 Non-Hodgkin lymphoma, unspecified, unspecified site: Secondary | ICD-10-CM

## 2013-05-14 MED ORDER — COLCHICINE 0.6 MG PO TABS: 0.6000 mg | ORAL_TABLET | Freq: Two times a day (BID) | ORAL | Status: DC

## 2013-05-14 MED ORDER — DILTIAZEM HCL ER COATED BEADS 120 MG PO CP24: 120.0000 mg | ORAL_CAPSULE | Freq: Every day | ORAL | Status: DC

## 2013-05-14 NOTE — Telephone Encounter (Signed)
Informed wife that Dr. Alvy Bimler not too concerned about the scan but she did cancel upcoming CT scans and ordered PET scan instead.  Instructed wife to expect call from Radiology scheduler about PET and to keep appt w/ Dr. Alvy Bimler on 6/04 as scheduled. She verbalized understanding.

## 2013-05-14 NOTE — Telephone Encounter (Signed)
Please let him know I reviewed the scans myself and I am not too concerned I recommend not to change his appointment sooner

## 2013-05-14 NOTE — Discharge Summary (Signed)
Agree with discharge summary as outlined by Melina Copa, PA-C

## 2013-05-14 NOTE — Telephone Encounter (Signed)
Pt has CT CAP scheduled for 6/01 by Dr. Beryle Beams.  Does pt need to keep this appt?

## 2013-05-14 NOTE — Telephone Encounter (Signed)
Former pt of Dr. Beryle Beams. Wife states pt recently hospitalized for Pericarditis.  Had some Scans done in hospital which she thinks showed progression of lymphoma.  Pt scheduled to see Dr. Alvy Bimler on June 4th and she asks if pt can or should be seen any sooner?

## 2013-05-14 NOTE — Telephone Encounter (Signed)
I placed order for PET and cancel CT

## 2013-05-16 ENCOUNTER — Telehealth: Payer: Self-pay | Admitting: Adult Health

## 2013-05-16 NOTE — Telephone Encounter (Signed)
Sent paper about medication unavailable to Dr Harrington Challenger to [address] office for review.

## 2013-05-16 NOTE — Telephone Encounter (Signed)
Please see refill bin / tgs  °

## 2013-05-17 ENCOUNTER — Telehealth: Payer: Self-pay | Admitting: *Deleted

## 2013-05-17 NOTE — Telephone Encounter (Signed)
Fax from [employer] regarding colcrys tab 0.6 mg and colchicine 0.6 mg---"THESE MEDICATION ARE UNAVAILABLE, WOULD YOU LIKE TO SUBSTITUTE A DIFFERENT MEDICATION"

## 2013-05-18 NOTE — Telephone Encounter (Signed)
Per dr Harrington Challenger, the pt needs this for pericarditis. There is no substitute

## 2013-05-18 NOTE — Telephone Encounter (Signed)
Called and spoke with [facility], they have shipped the medicine out today.

## 2013-05-21 ENCOUNTER — Ambulatory Visit (INDEPENDENT_AMBULATORY_CARE_PROVIDER_SITE_OTHER): Payer: Medicare Other | Admitting: Adult Health

## 2013-05-21 ENCOUNTER — Encounter: Payer: Self-pay | Admitting: Adult Health

## 2013-05-21 VITALS — BP 122/74 | HR 66 | Ht 72.0 in

## 2013-05-21 DIAGNOSIS — I309 Acute pericarditis, unspecified: Secondary | ICD-10-CM

## 2013-05-21 DIAGNOSIS — I4891 Unspecified atrial fibrillation: Secondary | ICD-10-CM | POA: Diagnosis not present

## 2013-05-21 DIAGNOSIS — I480 Paroxysmal atrial fibrillation: Secondary | ICD-10-CM

## 2013-05-21 DIAGNOSIS — I2510 Atherosclerotic heart disease of native coronary artery without angina pectoris: Secondary | ICD-10-CM

## 2013-05-21 NOTE — Progress Notes (Signed)
HPI: Anthony Kelly is a 68 year old patient of Dr. Harrington Challenger we are following for ongoing assessment and management history of PSVT, CAD with prior cardiac CT revealing calcification. He presented to [facility] in April 2015 with sudden onset of chest pain. He was diagnosed with acute pericarditis, started on NSAIDs, and also placed on IV heparin wall MI was ruled out. He was ruled out for ACS. CT scan was negative for PE. Also during admission he had an episode of paroxysmal atrial fibrillation, was treated with diltiazem and then changed to oral diltiazem after spontaneous conversion to normal sinus rhythm. He was not placed on anticoagulation as his CHADS VASC score was 1.      The patient had a Lexiscan Cardiolite stress test also during hospitalization for evaluation of ischemia, which is found to be negative. The patient was sent home on anti-inflammatory therapy.   He comes today feeling much better. Pain in his chest as, swollen indurated, he is breathing better, has become more active. He continues on colchicine 0.6 mg tablet twice a day and will stay on this for a total of 3 months.   No Known Allergies  Current Outpatient Prescriptions  Medication Sig Dispense Refill  . aspirin EC 81 MG tablet Take 81 mg by mouth every morning.      Marland Kitchen atorvastatin (LIPITOR) 10 MG tablet Take 5 mg by mouth at bedtime.       . colchicine 0.6 MG tablet Take 1 tablet (0.6 mg total) by mouth 2 (two) times daily. For 3 months.  180 tablet  1  . diltiazem (CARDIZEM CD) 120 MG 24 hr capsule Take 1 capsule (120 mg total) by mouth daily.  90 capsule  1  . ibuprofen (ADVIL,MOTRIN) 800 MG tablet Take 1 tablet (800 mg total) by mouth 3 (three) times daily. Take with food.  35 tablet  1  . metoprolol succinate (TOPROL-XL) 25 MG 24 hr tablet Take 25 mg by mouth every morning.      . Multiple Vitamin (MULTIVITAMIN) tablet Take 1 tablet by mouth every morning.       Marland Kitchen omeprazole (PRILOSEC) 20 MG capsule Take 1  capsule (20 mg total) by mouth daily. While on ibuprofen.  30 capsule  0   No current facility-administered medications for this visit.    Past Medical History  Diagnosis Date  . PSVT (paroxysmal supraventricular tachycardia)   . Arthritis   . 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 Metastasis to lymph nodes dx'd 03/2011    lt submandibular ln  . Atrial fibrillation     a. Isolated episode in the setting of acute pericarditis 04/2013. Was not placed on anticoag.  Marland Kitchen Acute pericarditis     a. 04/2013 -adm with CP, elevated CRP. H/o coronary artery calcification on prior CT but nuc was negative, EF 71%.    Past Surgical History  Procedure Laterality Date  . Lung lobectomy      right side  . Cholecystectomy    . Lymph node biopsy      in groin with removal  . Meniscus repair      right knee  . Vein ligation and stripping  right leg  . Tonsillectomy      as a child  . Submandibular gland excision  04/2011  . Submandibular gland excision  04/30/2011    Procedure: EXCISION SUBMANDIBULAR GLAND;  Surgeon: Jerrell Belfast, MD;  Location: [place];  Service: ENT;  Laterality: Left;  WITH DIAGNOSTIC BIOPSY  . Exploratory laparotomy      WUJ:WJXBJY of systems complete and found to be negative unless listed above  PHYSICAL EXAM BP 122/74  Pulse 66  Ht 6' (1.829 m) General: Well developed, well nourished, in no acute distress Head: Eyes PERRLA, No xanthomas.   Normal cephalic and atramatic  Lungs: Clear bilaterally to auscultation and percussion. Heart: HRRR S1 S2, without MRG.  Pulses are 2+ & equal.            No carotid bruit. No JVD.  No abdominal bruits. No femoral bruits. Abdomen: Bowel  sounds are positive, abdomen soft and non-tender without masses or                  Hernia's noted. Msk:  Back normal, normal gait. Normal strength and tone for age. Extremities: No clubbing, cyanosis or edema.  DP +1 some bruising noted on his left forearm at the brachial area from frequent phlebotomy. Neuro: Alert and oriented X 3. Psych:  Good affect, responds appropriately   EKG: Normal sinus rhythm, with normalized T waves.  ASSESSMENT AND PLAN

## 2013-05-21 NOTE — Assessment & Plan Note (Signed)
Remains in normal sinus rhythm. He has not been placed on anticoagulation therapy, in the setting of pericarditis. He will followup with Dr. Pennelope Bracken in the [place] office.

## 2013-05-21 NOTE — Assessment & Plan Note (Signed)
Lexiscan Myoview was negative for ischemia during hospitalization in April of 2015. Continue risk modification

## 2013-05-21 NOTE — Progress Notes (Deleted)
Name: Anthony Kelly    DOB: July 16, 1945  Age: 68 y.o.  MR#: [id number]       PCP:  Lysbeth Penner, FNP      Insurance: Payor: MEDICARE / Plan: MEDICARE PART A AND B / Product Type: *No Product type* /   CC:    Chief Complaint  Patient presents with  . Atrial Fibrillation  . Coronary Artery Disease    VS Filed Vitals:   05/21/13 1420  BP: 122/74  Pulse: 66  Height: 6' (1.829 m)    Weights Current Weight  05/08/13 180 lb (81.647 kg)  03/07/13 193 lb 9.6 oz (87.816 kg)  02/16/13 191 lb 11.2 oz (86.955 kg)    Blood Pressure  BP Readings from Last 3 Encounters:  05/21/13 122/74  05/11/13 116/70  03/07/13 118/76     Admit date:  (Not on file) Last encounter with RMR:  05/16/2013   Allergy Review of patient's allergies indicates no known allergies.  Current Outpatient Prescriptions  Medication Sig Dispense Refill  . aspirin EC 81 MG tablet Take 81 mg by mouth every morning.      Marland Kitchen atorvastatin (LIPITOR) 10 MG tablet Take 5 mg by mouth at bedtime.       . colchicine 0.6 MG tablet Take 1 tablet (0.6 mg total) by mouth 2 (two) times daily. For 3 months.  180 tablet  1  . diltiazem (CARDIZEM CD) 120 MG 24 hr capsule Take 1 capsule (120 mg total) by mouth daily.  90 capsule  1  . ibuprofen (ADVIL,MOTRIN) 800 MG tablet Take 1 tablet (800 mg total) by mouth 3 (three) times daily. Take with food.  35 tablet  1  . metoprolol succinate (TOPROL-XL) 25 MG 24 hr tablet Take 25 mg by mouth every morning.      . Multiple Vitamin (MULTIVITAMIN) tablet Take 1 tablet by mouth every morning.       Marland Kitchen omeprazole (PRILOSEC) 20 MG capsule Take 1 capsule (20 mg total) by mouth daily. While on ibuprofen.  30 capsule  0   No current facility-administered medications for this visit.    Discontinued Meds:    Medications Discontinued During This Encounter  Medication Reason  . acetaminophen (TYLENOL) 500 MG tablet Error    Patient Active Problem List   Diagnosis Date Noted  . PAF (paroxysmal  atrial fibrillation) 05/10/2013  . History of PSVT (paroxysmal supraventricular tachycardia) 05/10/2013  . Family history of coronary artery disease 05/10/2013  . Coronary artery calcification seen on CAT scan 05/10/2013  . Chest pain 05/09/2013  . Acute pericarditis- ST elavation on EKG 05/09/2013  . 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    LABS    Component Value Date/Time   NA 139 05/10/2013 0449   NA 141 05/09/2013 0550   NA 137 05/08/2013 2230   NA 142 02/13/2013 0749   NA 141 10/24/2012 0856   NA 140 07/20/2012 0831   NA 139 07/20/2011 0800   NA 142 10/20/2010 1000   NA 142 04/20/2010 0918   K 5.0 05/10/2013 0449   K 4.4 05/09/2013 0550   K 4.0 05/08/2013 2230   K 4.3 02/13/2013 0749   K 4.5 10/24/2012 0856   K 4.5 07/20/2012 0831   K 4.4 07/20/2011 0800   K 4.2 10/20/2010 1000   K 4.7 04/20/2010 0918   CL 105 05/10/2013 0449   CL  104 05/09/2013 0550   CL 99 05/08/2013 2230   CL 104 03/24/2012 1024   CL 105 11/22/2011 0940   CL 100 07/20/2011 0800   CL 102 10/20/2010 1000   CL 105 04/20/2010 0918   CO2 18* 05/10/2013 0449   CO2 24 05/09/2013 0550   CO2 26 05/08/2013 2230   CO2 28 02/13/2013 0749   CO2 29 10/24/2012 0856   CO2 27 07/20/2012 0831   CO2 29 07/20/2011 0800   CO2 26 10/20/2010 1000   CO2 28 04/20/2010 0918   GLUCOSE 103* 05/10/2013 0449   GLUCOSE 127* 05/09/2013 0550   GLUCOSE 134* 05/08/2013 2230   GLUCOSE 103 02/13/2013 0749   GLUCOSE 60* 10/24/2012 0856   GLUCOSE 76 07/20/2012 0831   GLUCOSE 80 03/24/2012 1024   GLUCOSE 60* 11/22/2011 0940   GLUCOSE 100 07/20/2011 0800   GLUCOSE 61* 10/20/2010 1000   GLUCOSE 79 04/20/2010 0918   BUN 14 05/10/2013 0449   BUN 14 05/09/2013 0550   BUN 16 05/08/2013 2230   BUN 14.5 02/13/2013 0749   BUN 17.6 10/24/2012 0856   BUN 20.6 07/20/2012 0831   BUN 14 07/20/2011 0800   BUN 11 10/20/2010 1000   BUN 16 04/20/2010 0918   CREATININE 1.02 05/10/2013 0449    CREATININE 0.95 05/09/2013 0550   CREATININE 0.98 05/08/2013 2230   CREATININE 1.0 02/13/2013 0749   CREATININE 1.1 10/24/2012 0856   CREATININE 1.0 07/20/2012 0831   CREATININE 1.2 07/20/2011 0800   CREATININE 1.0 10/20/2010 1000   CREATININE 1.2 04/20/2010 0918   CALCIUM 8.6 05/10/2013 0449   CALCIUM 8.7 05/09/2013 0550   CALCIUM 9.2 05/08/2013 2230   CALCIUM 10.0 02/13/2013 0749   CALCIUM 9.9 10/24/2012 0856   CALCIUM 9.4 07/20/2012 0831   CALCIUM 8.7 07/20/2011 0800   CALCIUM 9.0 10/20/2010 1000   CALCIUM 9.1 04/20/2010 0918   GFRNONAA 73* 05/10/2013 0449   GFRNONAA 84* 05/09/2013 0550   GFRNONAA 83* 05/08/2013 2230   GFRAA 85* 05/10/2013 0449   GFRAA >90 05/09/2013 0550   GFRAA >90 05/08/2013 2230   CMP     Component Value Date/Time   NA 139 05/10/2013 0449   NA 142 02/13/2013 0749   NA 139 07/20/2011 0800   K 5.0 05/10/2013 0449   K 4.3 02/13/2013 0749   K 4.4 07/20/2011 0800   CL 105 05/10/2013 0449   CL 104 03/24/2012 1024   CL 100 07/20/2011 0800   CO2 18* 05/10/2013 0449   CO2 28 02/13/2013 0749   CO2 29 07/20/2011 0800   GLUCOSE 103* 05/10/2013 0449   GLUCOSE 103 02/13/2013 0749   GLUCOSE 80 03/24/2012 1024   GLUCOSE 100 07/20/2011 0800   BUN 14 05/10/2013 0449   BUN 14.5 02/13/2013 0749   BUN 14 07/20/2011 0800   CREATININE 1.02 05/10/2013 0449   CREATININE 1.0 02/13/2013 0749   CREATININE 1.2 07/20/2011 0800   CALCIUM 8.6 05/10/2013 0449   CALCIUM 10.0 02/13/2013 0749   CALCIUM 8.7 07/20/2011 0800   PROT 6.8 05/08/2013 2230   PROT 7.3 02/13/2013 0749   PROT 7.2 07/20/2011 0800   ALBUMIN 3.9 05/08/2013 2230   ALBUMIN 4.3 02/13/2013 0749   AST 24 05/08/2013 2230   AST 22 02/13/2013 0749   AST 25 07/20/2011 0800   ALT 24 05/08/2013 2230   ALT 25 02/13/2013 0749   ALT 29 07/20/2011 0800   ALKPHOS 67 05/08/2013 2230   ALKPHOS 77 02/13/2013 0749   ALKPHOS 55  07/20/2011 0800   BILITOT 0.7 05/08/2013 2230   BILITOT 0.94 02/13/2013 0749   BILITOT 1.00 07/20/2011 0800   GFRNONAA 73* 05/10/2013 0449   GFRAA 85* 05/10/2013 0449        Component Value Date/Time   WBC 9.8 05/11/2013 0415   WBC 9.2 05/10/2013 0449   WBC 9.2 05/09/2013 0713   WBC 8.6 02/13/2013 0749   WBC 8.5 10/24/2012 0855   WBC 7.4 07/20/2012 0830   HGB 15.0 05/11/2013 0415   HGB 15.0 05/10/2013 0449   HGB 14.6 05/09/2013 0713   HGB 17.8* 02/13/2013 0749   HGB 17.3* 10/24/2012 0855   HGB 16.5 07/20/2012 0830   HCT 43.6 05/11/2013 0415   HCT 44.1 05/10/2013 0449   HCT 43.2 05/09/2013 0713   HCT 52.5* 02/13/2013 0749   HCT 51.4* 10/24/2012 0855   HCT 47.8 07/20/2012 0830   MCV 88.3 05/11/2013 0415   MCV 89.5 05/10/2013 0449   MCV 88.7 05/09/2013 0713   MCV 89.2 02/13/2013 0749   MCV 87.5 10/24/2012 0855   MCV 87.2 07/20/2012 0830    Lipid Panel     Component Value Date/Time   CHOL 109 01/01/2013 0927   TRIG 110 01/01/2013 0927   TRIG 153* 05/30/2012 0909   HDL 47 01/01/2013 0927   CHOLHDL 2.3 01/01/2013 0927   VLDL 22 01/01/2013 0927   LDLCALC 40 01/01/2013 0927   LDLCALC 77 05/30/2012 0909    ABG    Component Value Date/Time   PHART 7.392 12/20/2006 0500   PCO2ART 41.6 12/20/2006 0500   PO2ART 80.2 12/20/2006 0500   HCO3 24.7* 12/20/2006 0500   TCO2 26.0 12/20/2006 0500   O2SAT 96.8 12/20/2006 0500     Lab Results  Component Value Date   TSH 0.957 05/10/2013   BNP (last 3 results) No results found for this basename: PROBNP,  in the last 8760 hours Cardiac Panel (last 3 results) No results found for this basename: CKTOTAL, CKMB, TROPONINI, RELINDX,  in the last 72 hours  Iron/TIBC/Ferritin No results found for this basename: iron, tibc, ferritin     EKG Orders placed during the hospital encounter of 05/08/13  . ED EKG  . EKG 12-LEAD  . EKG 12-LEAD  . ED EKG  . ED EKG  . ED EKG  . EKG 12-LEAD  . EKG 12-LEAD  . ED EKG  . ED EKG  . EKG 12-LEAD  . EKG 12-LEAD  . EXERCISE TOLERANCE TEST  . EXERCISE TOLERANCE TEST     Prior Assessment and Plan Problem List as of 05/21/2013     Cardiovascular and Mediastinum   Acute pericarditis- ST elavation  on EKG   PAF (paroxysmal atrial fibrillation)   Coronary artery calcification seen on CAT scan     Other   Family history of coronary artery disease   Malignant lymphoma, high grade   Submandibular gland mass   Low grade B-cell lymphoma   Chest pain   History of PSVT (paroxysmal supraventricular tachycardia)       Imaging: Dg Chest 2 View  05/08/2013   CLINICAL DATA:  Severe chest pain. History of non-Hodgkin's lymphoma with lung metastases.  EXAM: CHEST  2 VIEW  COMPARISON:  Chest radiograph performed 04/26/2011, and CT of the chest performed 02/13/2013  FINDINGS: The lungs are well-aerated. Mild bibasilar airspace opacities likely reflect atelectasis. There is no evidence of pleural effusion or pneumothorax.  The heart is normal in size; the mediastinal contour is within normal limits. No  acute osseous abnormalities are seen. Clips are noted within the right upper quadrant, reflecting prior cholecystectomy.  IMPRESSION: Mild bibasilar airspace opacities likely reflect atelectasis. Lungs otherwise grossly clear.   Electronically Signed   By: Garald Balding M.D.   On: 05/08/2013 23:56   Ct Angio Chest W/cm &/or Wo Cm  05/09/2013   CLINICAL DATA:  Chest pain. Diaphoresis and nausea. 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 Assess for dissection.  EXAM: CT ANGIOGRAPHY CHEST WITH CONTRAST  TECHNIQUE: Multidetector CT imaging of the chest was performed using the standard protocol during bolus administration of intravenous contrast. Multiplanar CT image reconstructions and MIPs were obtained to evaluate the vascular anatomy.  CONTRAST:  153mL OMNIPAQUE IOHEXOL 350 MG/ML SOLN  COMPARISON:  CT of the chest performed 02/13/2013  FINDINGS: There is no evidence of aortic dissection. No aneurysmal dilatation is seen. Minimal calcific atherosclerotic disease is seen, without significant mural thrombus or luminal narrowing.  There is no evidence of pulmonary embolus.  Mild emphysematous change  is noted at the lung apices. Bibasilar airspace opacification likely reflects atelectasis. Postoperative change is noted at the right hilum. There is no evidence of pleural effusion or pneumothorax. No masses are identified; no abnormal focal contrast enhancement is seen.  Scattered coronary artery calcifications are seen. A few mildly prominent nodes are seen, measuring 1.2 cm in the subcarinal region and 1.1 cm at the right hilum. These are of uncertain significance, but could reflect the patient's history of lymphoma. No pericardial effusion is identified. The great vessels are unremarkable in appearance. No axillary lymphadenopathy is seen. The thyroid gland is unremarkable in appearance.  There is diffuse prominence of the intrahepatic biliary ducts and common hepatic duct, likely reflecting prior cholecystectomy. A 1.1 cm hypodensity within the left hepatic lobe is nonspecific but may reflect a small cyst. The visualized portions of the spleen are unremarkable. Note is made of a moderate hiatal hernia. Mild apparent wall thickening along the body of the stomach is thought to reflect intraluminal contents.  There is diffuse retroperitoneal haziness, thought to reflect the patient's history of lymphoma. A 1.4 cm node is seen adjacent to the left adrenal gland. Scattered smaller peripancreatic nodes are seen.  No acute osseous abnormalities are seen. Degenerative change is noted at the lower cervical spine.  Review of the MIP images confirms the above findings.  IMPRESSION: 1. No evidence of aortic dissection. No aneurysmal dilatation seen. Minimal calcific atherosclerotic disease noted. 2. No evidence of pulmonary embolus. 3. Mild emphysematous change at the lung apices. Bibasilar airspace opacification likely reflects atelectasis. 4. Scattered coronary artery calcifications seen. 5. Mildly prominent subcarinal and right hilar nodes seen, measuring up to 1.2 cm. These are of uncertain significance, but could  reflect the patient's history of lymphoma. 6. Diffuse retroperitoneal haziness at the upper abdomen, thought to reflect the patient's history of lymphoma. 1.4 cm node seen adjacent to the left adrenal gland. Scattered smaller peripancreatic nodes seen. 7. Moderate hiatal hernia noted. Mild apparent wall thickening along the body of the stomach is thought to reflect intraluminal contents. 8. Possible small hepatic cyst noted.   Electronically Signed   By: Garald Balding M.D.   On: 05/09/2013 00:55   Nm Myocar Multi W/spect W/wall Motion / Ef  05/11/2013   CLINICAL DATA:  Chest pain  EXAM: MYOCARDIAL IMAGING WITH SPECT (REST AND PHARMACOLOGIC-STRESS)  GATED LEFT VENTRICULAR WALL MOTION STUDY  LEFT VENTRICULAR EJECTION FRACTION  TECHNIQUE: Standard myocardial SPECT imaging was performed after  resting intravenous injection of 10 mCi Tc-53m sestamibi. Subsequently, intravenous infusion of Lexiscan was performed under the supervision of the Cardiology staff. At peak effect of the drug, 30 mCi Tc-43m sestamibi was injected intravenously and standard myocardial SPECT imaging was performed. Quantitative gated imaging was also performed to evaluate left ventricular wall motion, and estimate left ventricular ejection fraction.  COMPARISON:  None.  FINDINGS: SPECT:  Inferior wall attenuation artifact.  No perfusion defects.  Wall motion:  Normal.  Ejection fraction: 71%. End-diastolic volume 71 cc. End systolic volume 21 cc.  IMPRESSION: Allowing for attenuation artifact, there are nor perfusion defects and there is no evidence of ischemia.   Electronically Signed   By: Maryclare Bean M.D.   On: 05/11/2013 13:40

## 2013-05-21 NOTE — Assessment & Plan Note (Signed)
Repeat EKG demonstrated normal sinus rhythm with normalization of T waves. The patient is continuing on colchicine and will stay on this regimen for an additional 2 to half months. The patient will have a followup echocardiogram completed in a month for reevaluation. He wishes to be est. with Dr. Pennelope Bracken, in the [place] office, and status where he and his wife live. Followup appointment will be made at that clinic.

## 2013-05-21 NOTE — Patient Instructions (Signed)
Your physician recommends that you schedule a follow-up appointment in: 2 months with Dr Bronson Ing  Your physician has requested that you have an echocardiogram. Echocardiography is a painless test that uses sound waves to create images of your heart. It provides your doctor with information about the size and shape of your heart and how well your heart's chambers and valves are working. This procedure takes approximately one hour. There are no restrictions for this procedure. 1 month

## 2013-06-05 ENCOUNTER — Telehealth: Payer: Self-pay | Admitting: *Deleted

## 2013-06-05 ENCOUNTER — Telehealth: Payer: Self-pay | Admitting: Hematology and Oncology

## 2013-06-05 NOTE — Telephone Encounter (Signed)
, °

## 2013-06-05 NOTE — Telephone Encounter (Signed)
Wife request lab same day as PET scan so pt does not need to come back on a separate day for lab.  [place] out of town.  POF sent to move lab to 5/28 in am before the PET.  Instructed wife to come at 8:15 am for lab on 5/28.  She verbalized understanding.

## 2013-06-07 ENCOUNTER — Ambulatory Visit (HOSPITAL_COMMUNITY)
Admission: RE | Admit: 2013-06-07 | Discharge: 2013-06-07 | Disposition: A | Discharge status: Home / Self Care | Payer: Medicare Other | Source: Ambulatory Visit | Attending: Hematology and Oncology | Admitting: Hematology and Oncology

## 2013-06-07 ENCOUNTER — Encounter (HOSPITAL_COMMUNITY): Payer: Self-pay

## 2013-06-07 ENCOUNTER — Other Ambulatory Visit (HOSPITAL_BASED_OUTPATIENT_CLINIC_OR_DEPARTMENT_OTHER): Payer: Medicare Other

## 2013-06-07 DIAGNOSIS — I2510 Atherosclerotic heart disease of native coronary artery without angina pectoris: Secondary | ICD-10-CM | POA: Insufficient documentation

## 2013-06-07 DIAGNOSIS — N400 Enlarged prostate without lower urinary tract symptoms: Secondary | ICD-10-CM | POA: Insufficient documentation

## 2013-06-07 DIAGNOSIS — J438 Other emphysema: Secondary | ICD-10-CM | POA: Diagnosis not present

## 2013-06-07 DIAGNOSIS — K449 Diaphragmatic hernia without obstruction or gangrene: Secondary | ICD-10-CM | POA: Insufficient documentation

## 2013-06-07 DIAGNOSIS — C8589 Other specified types of non-Hodgkin lymphoma, extranodal and solid organ sites: Secondary | ICD-10-CM | POA: Diagnosis not present

## 2013-06-07 DIAGNOSIS — C8590 Non-Hodgkin lymphoma, unspecified, unspecified site: Secondary | ICD-10-CM

## 2013-06-07 DIAGNOSIS — K5730 Diverticulosis of large intestine without perforation or abscess without bleeding: Secondary | ICD-10-CM | POA: Diagnosis not present

## 2013-06-07 DIAGNOSIS — R599 Enlarged lymph nodes, unspecified: Secondary | ICD-10-CM | POA: Insufficient documentation

## 2013-06-07 DIAGNOSIS — C8190 Hodgkin lymphoma, unspecified, unspecified site: Secondary | ICD-10-CM | POA: Diagnosis not present

## 2013-06-07 LAB — CBC WITH DIFFERENTIAL/PLATELET
BASO%: 1.1 % (ref 0.0–2.0)
Basophils Absolute: 0.1 10*3/uL (ref 0.0–0.1)
EOS%: 4 % (ref 0.0–7.0)
Eosinophils Absolute: 0.3 10*3/uL (ref 0.0–0.5)
HCT: 47.7 % (ref 38.4–49.9)
HEMOGLOBIN: 15.8 g/dL (ref 13.0–17.1)
LYMPH#: 1.6 10*3/uL (ref 0.9–3.3)
LYMPH%: 20.9 % (ref 14.0–49.0)
MCH: 29.5 pg (ref 27.2–33.4)
MCHC: 33.2 g/dL (ref 32.0–36.0)
MCV: 88.9 fL (ref 79.3–98.0)
MONO#: 0.7 10*3/uL (ref 0.1–0.9)
MONO%: 9.7 % (ref 0.0–14.0)
NEUT%: 64.3 % (ref 39.0–75.0)
NEUTROS ABS: 4.9 10*3/uL (ref 1.5–6.5)
Platelets: 140 10*3/uL (ref 140–400)
RBC: 5.37 10*6/uL (ref 4.20–5.82)
RDW: 13.2 % (ref 11.0–14.6)
WBC: 7.5 10*3/uL (ref 4.0–10.3)

## 2013-06-07 LAB — COMPREHENSIVE METABOLIC PANEL (CC13)
ALBUMIN: 4 g/dL (ref 3.5–5.0)
ALT: 24 U/L (ref 0–55)
ANION GAP: 10 meq/L (ref 3–11)
AST: 20 U/L (ref 5–34)
Alkaline Phosphatase: 74 U/L (ref 40–150)
BUN: 18 mg/dL (ref 7.0–26.0)
CALCIUM: 8.9 mg/dL (ref 8.4–10.4)
CHLORIDE: 107 meq/L (ref 98–109)
CO2: 24 meq/L (ref 22–29)
CREATININE: 1 mg/dL (ref 0.7–1.3)
Glucose: 95 mg/dl (ref 70–140)
POTASSIUM: 4.4 meq/L (ref 3.5–5.1)
Sodium: 141 mEq/L (ref 136–145)
Total Bilirubin: 0.64 mg/dL (ref 0.20–1.20)
Total Protein: 6.6 g/dL (ref 6.4–8.3)

## 2013-06-07 LAB — LACTATE DEHYDROGENASE (CC13): LDH: 140 U/L (ref 125–245)

## 2013-06-07 LAB — GLUCOSE, CAPILLARY: Glucose-Capillary: 96 mg/dL (ref 70–99)

## 2013-06-07 IMAGING — CT NM PET TUM IMG RESTAG (PS) SKULL BASE T - THIGH
1 of 7 series · 1 of 25 positions shown · non-contrast
Comparison: Multiple exams, including [DATE]

CLINICAL DATA: Subsequent treatment strategy for non-Hodgkin's
lymphoma.

EXAM:
NUCLEAR MEDICINE PET SKULL BASE TO THIGH
TECHNIQUE: 10.9 mCi F-18 FDG was injected intravenously. Full-ring PET imaging
was performed from the skull base to thigh after the radiotracer. CT
data was obtained and used for attenuation correction and anatomic
localization.
FASTING BLOOD GLUCOSE:  Value: 96 mg/dl

[Series 4: ct sk_thigh 5.0 b31f · axial · 5.0mm · 0.98mm/px · 1 of 229 slices shown]
[im 229/229  brain]
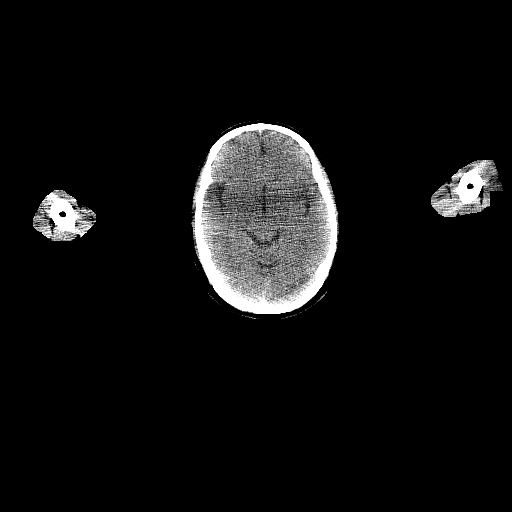

[1 of 25 positions shown; findings below may reference images not displayed]

FINDINGS: NECK

No hypermetabolic lymph nodes in the neck.

CHEST

Several small AP window lymph nodes measure up to 8 mm in short axis
and have a maximum standard uptake value of 3.0. Indistinct
paraspinal soft tissue density is slightly more prominent on the
right side and is hypermetabolic, with maximum standard uptake value
proximally 5.8. This right paraspinal soft tissue density on image
77 of series 4 measures up to 5 mm in thickness. There is a smaller
amount of left paraspinal hypermetabolic soft tissue. The previously
hypermetabolic right lower lobe nodule has been wedged and is no
longer present.

Moderate-sized hiatal hernia.  Coronary atherosclerosis.  Emphysema.

ABDOMEN/PELVIS

Considerable pathologic retroperitoneal adenopathy with numerous
conglomerate nodes. Confluent left periaortic nodes at the level of
the lower poles of the kidneys with maximum standard uptake value of
8.8. Largest individual node in this vicinity measures approximately
1.5 cm in short axis.

Hypermetabolic indistinctly marginated mesenteric lymph nodes are
present. Just below the pancreatic head, a mesenteric node with
short axis diameter of 1.1 cm has a maximum standard uptake value of
6.8.

Hypermetabolic porta hepatis adenopathy. The lymph node along the
hiatal hernia at the hiatus measures 1.3 cm in short axis with
maximum standard uptake value of 5.1. Additional hypermetabolic
retrocrural adenopathy observed.

Graph in the lower thoracic spine, there are multilevel fluid
density lesions which are not hypermetabolic in the neural foramina
which probably mildly expand the foramina. These are stable.

Bilateral common iliac and right greater than left external iliac
hypermetabolic adenopathy

A right external iliac node measuring 2.6 cm in short axis on image
176 of series 4 has a maximum standard uptake value of 10.7.

Sigmoid diverticulosis. Left hepatic lobe fluid density lesion
compatible with cyst. Moderately prominent prostate gland, slightly
asymmetric, 5.8 x 4.3 cm.

SKELETON

Several bony foci of hypermetabolic activity are present including
the right second rib lesion with maximum standard uptake value of
2.9, a right sixth rib lesion with maximum standard uptake value of
5.4, faintly hypermetabolic activity in the left tenth rib, and
additional scattered rib hypermetabolic activity.
IMPRESSION: 1. Extensive new hypermetabolic adenopathy in the abdomen and pelvis
compatible with lymphoma.
2. Small but hypermetabolic mediastinal lymph nodes are also
involved.
3. Paraspinal hypermetabolic soft tissue density may reflect tumor
or extramedullary hematopoiesis.
4. Low grade hyper may metabolism in multiple ribs without
underlying rib fractures, suspicious for rib involvement with
lymphoma.
5. Ancillary findings include hiatal hernia, atherosclerosis,
emphysema, sigmoid diverticulosis, enlarged prostate gland, and
perineural cysts in the lower thoracic spine.

## 2013-06-07 MED ORDER — FLUDEOXYGLUCOSE F - 18 (FDG) INJECTION
10.9000 | Freq: Once | INTRAVENOUS | Status: AC | PRN
  Administered 2013-06-07: 10.9 via INTRAVENOUS

## 2013-06-09 DIAGNOSIS — J019 Acute sinusitis, unspecified: Secondary | ICD-10-CM | POA: Diagnosis not present

## 2013-06-09 DIAGNOSIS — J209 Acute bronchitis, unspecified: Secondary | ICD-10-CM | POA: Diagnosis not present

## 2013-06-11 ENCOUNTER — Ambulatory Visit (HOSPITAL_COMMUNITY): Payer: Medicare Other

## 2013-06-11 ENCOUNTER — Other Ambulatory Visit: Payer: Medicare Other

## 2013-06-14 ENCOUNTER — Encounter: Payer: Self-pay | Admitting: Hematology and Oncology

## 2013-06-14 ENCOUNTER — Telehealth: Payer: Self-pay | Admitting: Hematology and Oncology

## 2013-06-14 ENCOUNTER — Ambulatory Visit (HOSPITAL_BASED_OUTPATIENT_CLINIC_OR_DEPARTMENT_OTHER): Payer: Medicare Other | Admitting: Hematology and Oncology

## 2013-06-14 VITALS — BP 129/76 | HR 70 | Temp 98.0°F | Resp 18 | Ht 72.0 in | Wt 193.9 lb

## 2013-06-14 DIAGNOSIS — I309 Acute pericarditis, unspecified: Secondary | ICD-10-CM

## 2013-06-14 DIAGNOSIS — J329 Chronic sinusitis, unspecified: Secondary | ICD-10-CM | POA: Diagnosis not present

## 2013-06-14 DIAGNOSIS — C8589 Other specified types of non-Hodgkin lymphoma, extranodal and solid organ sites: Secondary | ICD-10-CM

## 2013-06-14 DIAGNOSIS — C8510 Unspecified B-cell lymphoma, unspecified site: Secondary | ICD-10-CM

## 2013-06-14 NOTE — Assessment & Plan Note (Signed)
This is worrisome for possible lymphoma involvement. He is not symptomatic and is improving on treatment. I would defer to his cardiologist for echocardiogram and management for now.

## 2013-06-14 NOTE — Patient Instructions (Signed)
Ofatumumab injection What is this medicine? OFATUMUMAB (O fa TOOM ue mab) is a monoclonal antibody. It is used to treat chronic lymphocytic leukemia. In cancer cells, this drug targets a specific protein on cancer cells and stops the cancer cells from growing. This medicine may be used for other purposes; ask your health care provider or pharmacist if you have questions. COMMON BRAND NAME(S): Arzerra What should I tell my health care provider before I take this medicine? They need to know if you have any of these conditions: -hepatitis -low blood counts, like low white cell, platelet, or red cell counts -lung or breathing disease -stomach problems -an unusual or allergic reaction to ofatumumab, other medicines, foods, dyes, or preservatives -pregnant or trying to get pregnant -breast-feeding How should I use this medicine? This medicine is for infusion into a vein. It is given by a health care professional in a hospital or clinic setting. Talk to your pediatrician regarding the use of this medicine in children. Special care may be needed. Overdosage: If you think you've taken too much of this medicine contact a poison control center or emergency room at once. Overdosage: If you think you have taken too much of this medicine contact a poison control center or emergency room at once. NOTE: This medicine is only for you. Do not share this medicine with others. What if I miss a dose? Keep appointments for follow-up doses as directed. It is important not to miss your dose. Call your doctor or health care professional if you are unable to keep an appointment. What may interact with this medicine? -live virus vaccines This list may not describe all possible interactions. Give your health care provider a list of all the medicines, herbs, non-prescription drugs, or dietary supplements you use. Also tell them if you smoke, drink alcohol, or use illegal drugs. Some items may interact with your  medicine. What should I watch for while using this medicine? Report any side effects that you notice during your treatment right away, such as changes in your breathing, fever, chills, dizziness or lightheadedness. These effects are more common with the first dose. Visit your prescriber or health care professional for checks on your progress. You will need to have regular blood work. Report any other side effects. The side effects of this medicine can continue after you finish your treatment. Continue your course of treatment even though you feel ill unless your doctor tells you to stop. Call your doctor or health care professional for advice if you get a fever, chills or sore throat, or other symptoms of a cold or flu. Do not treat yourself. This drug decreases your body's ability to fight infections. Try to avoid being around people who are sick. This medicine may increase your risk to bruise or bleed. Call your doctor or health care professional if you notice any unusual bleeding. Be careful brushing and flossing your teeth or using a toothpick because you may get an infection or bleed more easily. If you have any dental work done, tell your dentist you are receiving this medicine. Avoid taking products that contain aspirin, acetaminophen, ibuprofen, naproxen, or ketoprofen unless instructed by your doctor. These medicines may hide a fever. Do not become pregnant while taking this medicine. Women should inform their doctor if they wish to become pregnant or think they might be pregnant. There is a potential for serious side effects to an unborn child. Talk to your health care professional or pharmacist for more information. Do not breast-feed an  infant while taking this medicine. What side effects may I notice from receiving this medicine? Side effects that you should report to your doctor or health care professional as soon as possible: -allergic reactions like skin rash, itching or hives, swelling of  the face, lips, or tongue -breathing problems -changes in vision -confusion, trouble speaking or understanding -cough -fainting spells -fever or chills -general ill feeling or flu-like symptoms -lightheadedness -loss of appetite, nausea -low blood counts - this medicine may decrease the number of white blood cells, red blood cells and platelets. You may be at increased risk for infections and bleeding. -right upper belly pain -signs of decreased platelets or bleeding - bruising, pinpoint red spots on the skin, black, tarry stools, blood in the urine -sore throat -stomach pain -trouble walking, dizziness, loss of balance or coordination -unusual bleeding or bruising -unusually weak or tired -yellowing of the eyes or skin Side effects that usually do not require medical attention (Report these to your doctor or health care professional if they continue or are bothersome.): -difficulty sleeping -headache -swelling of the legs or ankles This list may not describe all possible side effects. Call your doctor for medical advice about side effects. You may report side effects to FDA at 1-800-FDA-1088. Where should I keep my medicine? This drug is only given in a hospital or clinic and will not be stored at home. NOTE: This sheet is a summary. It may not cover all possible information. If you have questions about this medicine, talk to your doctor, pharmacist, or health care provider.  2014, [employer] Standard. (2011-09-27 17:21:12)

## 2013-06-14 NOTE — Assessment & Plan Note (Signed)
He is currently receiving antibiotic therapy. Recheck immunoglobulin levels with his next visit. If the patient is found to have severe hypogammaglobulinemia, that would be another indication to consider treatment for lymphoma sooner than later.

## 2013-06-14 NOTE — Progress Notes (Signed)
[name] progress notes  Patient Care Team: Lysbeth Penner, FNP as PCP - General (Family Medicine) Fay Records, MD as Attending Physician (Cardiology)  CHIEF COMPLAINTS/PURPOSE OF VISIT:  Recurrent B-cell non-Hodgkin lymphoma  HISTORY OF PRESENTING ILLNESS:  Anthony Kelly 68 y.o. male was transferred to my care after his prior physician has left.  I reviewed the patient's records extensive and collaborated the history with the patient. Summary of his history is as follows: He was initially presented with left inguinal lymphadenopathy in June 2004 with biopsy showing low grade non-Hodgkin's lymphoma. He was treated with observation alone until November 2005 when the lymph node mass began to grow and repeat biopsy showed conversion to high-grade lymphoma. He achieved a complete remission with CHOP Rituxan. He developed a single right pulmonary lesion in November 2008 found on a routine followup scan. This was surgically resected 12/19/2006 and showed low-grade non-Hodgkin's lymphoma. He was treated with Rituxan intermittently for 2 years between December 2008 in August 2010. At time of a routine followup visit in March of 2013 he pointed out an enlarged left submandibular gland which he had noted for about one month. CT scans of the neck chest abdomen and pelvis showed an isolated lesion within the left submandibular gland. He was referred to ear nose and throat surgery and underwent excision of the gland on 04/30/2011 by Dr. Jerrell Belfast. Findings were consistent with an isolated focus of low-grade non-Hodgkin's lymphoma. Since the remainder of his restaging evaluation was unremarkable and the only site of obvious disease was surgically removed, he was not put back on any specific treatment. He has been followed with clinical exam and every 3 -four-month CT scans. He has a number of small mesenteric and retroperitoneal lymph nodes which have had minor fluctuations in  size but overall have remained stable in an otherwise asymptomatic patient  He denies any constitutional symptoms. He remains active with a performance status 0. He had recent pericarditis which is improving on treatment. He is currently on antibiotics for sinus infection. He denies any recent fever, chills, night sweats or abnormal weight loss     MEDICAL HISTORY:  Past Medical History  Diagnosis Date  . PSVT (paroxysmal supraventricular tachycardia)   . Arthritis   . 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 Metastasis to lymph nodes dx'd 03/2011    lt submandibular ln  . Atrial fibrillation     a. Isolated episode in the setting of acute pericarditis 04/2013. Was not placed on anticoag.  Marland Kitchen Acute pericarditis     a. 04/2013 -adm with CP, elevated CRP. H/o coronary artery calcification on prior CT but nuc was negative, EF 71%.    SURGICAL HISTORY: Past Surgical History  Procedure Laterality Date  . Lung lobectomy      right side  . Cholecystectomy    . Lymph node biopsy      in groin with removal  . Meniscus repair      right knee  . Vein ligation and stripping      right leg  . Tonsillectomy      as a child  . Submandibular gland excision  04/2011  . Submandibular gland  excision  04/30/2011    Procedure: EXCISION SUBMANDIBULAR GLAND;  Surgeon: Jerrell Belfast, MD;  Location: [facility];  Service: ENT;  Laterality: Left;  WITH DIAGNOSTIC BIOPSY  . Exploratory laparotomy      SOCIAL HISTORY: History   Social History  . Marital Status: Married    Spouse Name: N/A    Number of Children: N/A  . Years of Education: N/A   Occupational History  . Not on file.   Social History Main Topics   . Smoking status: Former Smoker -- 1.50 packs/day for 25 years    Quit date: 01/11/1990  . Smokeless tobacco: Never Used  . Alcohol Use: No  . Drug Use: No  . Sexual Activity: Not Currently   Other Topics Concern  . Not on file   Social History Narrative  . No narrative on file    FAMILY HISTORY: Family History  Problem Relation Age of Onset  . Anesthesia problems Neg Hx   . Heart disease Father   . Cancer Sister     breast ca  . Cancer Brother     prostate ca    ALLERGIES:  has No Known Allergies.  MEDICATIONS:  Current Outpatient Prescriptions  Medication Sig Dispense Refill  . amoxicillin (AMOXIL) 875 MG tablet Take 875 mg by mouth daily.      Marland Kitchen aspirin EC 81 MG tablet Take 81 mg by mouth every morning.      Marland Kitchen atorvastatin (LIPITOR) 10 MG tablet Take 5 mg by mouth at bedtime.       . colchicine 0.6 MG tablet Take 1 tablet (0.6 mg total) by mouth 2 (two) times daily. For 3 months.  180 tablet  1  . diltiazem (CARDIZEM CD) 120 MG 24 hr capsule Take 1 capsule (120 mg total) by mouth daily.  90 capsule  1  . ibuprofen (ADVIL,MOTRIN) 800 MG tablet Take 1 tablet (800 mg total) by mouth 3 (three) times daily. Take with food.  35 tablet  1  . Lactobacillus (ACIDOPHILUS PO) Take by mouth daily.      . metoprolol succinate (TOPROL-XL) 25 MG 24 hr tablet Take 25 mg by mouth every morning.      . Multiple Vitamin (MULTIVITAMIN) tablet Take 1 tablet by mouth every morning.       Marland Kitchen omeprazole (PRILOSEC) 20 MG capsule Take 1 capsule (20 mg total) by mouth daily. While on ibuprofen.  30 capsule  0   No current facility-administered medications for this visit.    REVIEW OF SYSTEMS:   Constitutional: Denies fevers, chills or abnormal night sweats Eyes: Denies blurriness of vision, double vision or watery eyes Ears, nose, mouth, throat, and face: Denies mucositis or sore throat Respiratory: Denies cough, dyspnea or wheezes Gastrointestinal:  Denies nausea, heartburn or change in  bowel habits Skin: Denies abnormal skin rashes Lymphatics: Denies new lymphadenopathy or easy bruising Neurological:Denies numbness, tingling or new weaknesses Behavioral/Psych: Mood is stable, no new changes  All other systems were reviewed with the patient and are negative.  PHYSICAL EXAMINATION: ECOG PERFORMANCE STATUS: 1 - Symptomatic but completely ambulatory  Filed Vitals:   06/14/13 0834  BP: 129/76  Pulse: 70  Temp: 98 F (36.7 C)  Resp: 18   Filed Weights   06/14/13 0834  Weight: 193 lb 14.4 oz (87.952 kg)    GENERAL:alert, no distress and comfortable SKIN: skin color, texture, turgor are normal, no rashes or significant lesions EYES: normal, conjunctiva are pink and non-injected, sclera clear OROPHARYNX:no exudate,  normal lips, buccal mucosa, and tongue  NECK: supple, thyroid normal size, non-tender, without nodularity LYMPH:  no palpable lymphadenopathy in the cervical, axillary or inguinal LUNGS: clear to auscultation and percussion with normal breathing effort HEART: regular rate & rhythm and no murmurs without lower extremity edema ABDOMEN:abdomen soft, non-tender and normal bowel sounds Musculoskeletal:no cyanosis of digits and no clubbing  PSYCH: alert & oriented x 3 with fluent speech NEURO: no focal motor/sensory deficits  LABORATORY DATA:  I have reviewed the data as listed Lab Results  Component Value Date   WBC 7.5 06/07/2013   HGB 15.8 06/07/2013   HCT 47.7 06/07/2013   MCV 88.9 06/07/2013   PLT 140 06/07/2013    Recent Labs  01/01/13 0931 02/13/13 0749  05/08/13 2230 05/09/13 0550 05/10/13 0449 06/07/13 0824  NA  --  142  < > 137 141 139 141  K  --  4.3  < > 4.0 4.4 5.0 4.4  CL  --   --   --  99 104 105  --   CO2  --  28  < > 26 24 18* 24  GLUCOSE  --  103  < > 134* 127* 103* 95  BUN  --  14.5  < > 16 14 14  18.0  CREATININE  --  1.0  < > 0.98 0.95 1.02 1.0  CALCIUM  --  10.0  < > 9.2 8.7 8.6 8.9  GFRNONAA  --   --   --  83* 84* 73*  --    GFRAA  --   --   --  >90 >90 85*  --   PROT 6.5 7.3  --  6.8  --   --  6.6  ALBUMIN 4.3 4.3  --  3.9  --   --  4.0  AST 20 22  --  24  --   --  20  ALT 21 25  --  24  --   --  24  ALKPHOS 59 77  --  67  --   --  74  BILITOT 0.7 0.94  --  0.7  --   --  0.64  BILIDIR 0.2  --   --   --   --   --   --   IBILI 0.5  --   --   --   --   --   --   < > = values in this interval not displayed.  RADIOGRAPHIC STUDIES: I reviewed the imaging study with the patient and his wife, comparing with the old Imaging study. I have personally reviewed the radiological images as listed and agreed with the findings in the report. Nm Pet Image Restag (ps) Skull Base To Thigh  06/07/2013   CLINICAL DATA:  Subsequent treatment strategy for non-Hodgkin's lymphoma.  EXAM: NUCLEAR MEDICINE PET SKULL BASE TO THIGH  TECHNIQUE: 10.9 mCi F-18 FDG was injected intravenously. Full-ring PET imaging was performed from the skull base to thigh after the radiotracer. CT data was obtained and used for attenuation correction and anatomic localization.  FASTING BLOOD GLUCOSE:  Value: 96 mg/dl  COMPARISON:  Multiple exams, including 12/01/2006  FINDINGS: NECK  No hypermetabolic lymph nodes in the neck.  CHEST  Several small AP window lymph nodes measure up to 8 mm in short axis and have a maximum standard uptake value of 3.0. Indistinct paraspinal soft tissue density is slightly more prominent on the right side and is hypermetabolic, with maximum standard uptake value proximally  5.8. This right paraspinal soft tissue density on image 77 of series 4 measures up to 5 mm in thickness. There is a smaller amount of left paraspinal hypermetabolic soft tissue. The previously hypermetabolic right lower lobe nodule has been wedged and is no longer present.  Moderate-sized hiatal hernia.  Coronary atherosclerosis.  Emphysema.  ABDOMEN/PELVIS  Considerable pathologic retroperitoneal adenopathy with numerous conglomerate nodes. Confluent left periaortic  nodes at the level of the lower poles of the kidneys with maximum standard uptake value of 8.8. Largest individual node in this vicinity measures approximately 1.5 cm in short axis.  Hypermetabolic indistinctly marginated mesenteric lymph nodes are present. Just below the pancreatic head, a mesenteric node with short axis diameter of 1.1 cm has a maximum standard uptake value of 6.8.  Hypermetabolic porta hepatis adenopathy. The lymph node along the hiatal hernia at the hiatus measures 1.3 cm in short axis with maximum standard uptake value of 5.1. Additional hypermetabolic retrocrural adenopathy observed.  Graph in the lower thoracic spine, there are multilevel fluid density lesions which are not hypermetabolic in the neural foramina which probably mildly expand the foramina. These are stable.  Bilateral common iliac and right greater than left external iliac hypermetabolic adenopathy  A right external iliac node measuring 2.6 cm in short axis on image 176 of series 4 has a maximum standard uptake value of 10.7.  Sigmoid diverticulosis. Left hepatic lobe fluid density lesion compatible with cyst. Moderately prominent prostate gland, slightly asymmetric, 5.8 x 4.3 cm.  SKELETON  Several bony foci of hypermetabolic activity are present including the right second rib lesion with maximum standard uptake value of 2.9, a right sixth rib lesion with maximum standard uptake value of 5.4, faintly hypermetabolic activity in the left tenth rib, and additional scattered rib hypermetabolic activity.  IMPRESSION: 1. Extensive new hypermetabolic adenopathy in the abdomen and pelvis compatible with lymphoma. 2. Small but hypermetabolic mediastinal lymph nodes are also involved. 3. Paraspinal hypermetabolic soft tissue density may reflect tumor or extramedullary hematopoiesis. 4. Low grade hyper may metabolism in multiple ribs without underlying rib fractures, suspicious for rib involvement with lymphoma. 5. Ancillary findings  include hiatal hernia, atherosclerosis, emphysema, sigmoid diverticulosis, enlarged prostate gland, and perineural cysts in the lower thoracic spine.   Electronically Signed   By: Sherryl Barters M.D.   On: 06/07/2013 12:35   ASSESSMENT & PLAN:  Acute pericarditis- ST elavation on EKG This is worrisome for possible lymphoma involvement. He is not symptomatic and is improving on treatment. I would defer to his cardiologist for echocardiogram and management for now.  Low grade B-cell lymphoma Repeat imaging study unfortunately showed diffuse disease, likely due to recurrence of his low grade lymphoma. Overall, he is not symptomatic. There are no B. symptoms. His blood work is completely normal. I recommend consideration for second opinion. Otherwise, I recommend return followup in 3 months with history, physical examination and blood work.  Sinus infection He is currently receiving antibiotic therapy. Recheck immunoglobulin levels with his next visit. If the patient is found to have severe hypogammaglobulinemia, that would be another indication to consider treatment for lymphoma sooner than later.    Orders Placed This Encounter  Procedures  . CBC with Differential    Standing Status: Future     Number of Occurrences:      Standing Expiration Date: 06/14/2014  . Comprehensive metabolic panel    Standing Status: Future     Number of Occurrences:      Standing Expiration Date: 06/14/2014  .  Lactate dehydrogenase    Standing Status: Future     Number of Occurrences:      Standing Expiration Date: 06/14/2014  . Hepatitis B core antibody, IgM    Standing Status: Future     Number of Occurrences:      Standing Expiration Date: 06/14/2014  . Hepatitis B surface antibody    Standing Status: Future     Number of Occurrences:      Standing Expiration Date: 06/14/2014  . Hepatitis B surface antigen    Standing Status: Future     Number of Occurrences:      Standing Expiration Date: 06/14/2014   . IgG, IgA, IgM    Standing Status: Future     Number of Occurrences:      Standing Expiration Date: 06/14/2014    All questions were answered. The patient knows to call the clinic with any problems, questions or concerns. I spent 40 minutes counseling the patient face to face. The total time spent in the appointment was 55 minutes and more than 50% was on counseling.     [name], MD 06/14/2013 10:12 PM

## 2013-06-14 NOTE — Telephone Encounter (Signed)
gve the pt his sept 2015 appt calendar

## 2013-06-14 NOTE — Assessment & Plan Note (Signed)
Repeat imaging study unfortunately showed diffuse disease, likely due to recurrence of his low grade lymphoma. Overall, he is not symptomatic. There are no B. symptoms. His blood work is completely normal. I recommend consideration for second opinion. Otherwise, I recommend return followup in 3 months with history, physical examination and blood work.

## 2013-06-18 ENCOUNTER — Ambulatory Visit (HOSPITAL_COMMUNITY)
Admission: RE | Admit: 2013-06-18 | Discharge: 2013-06-18 | Disposition: A | Discharge status: Home / Self Care | Payer: Medicare Other | Source: Ambulatory Visit | Attending: Adult Health | Admitting: Adult Health

## 2013-06-18 DIAGNOSIS — I480 Paroxysmal atrial fibrillation: Secondary | ICD-10-CM

## 2013-06-18 DIAGNOSIS — I4891 Unspecified atrial fibrillation: Secondary | ICD-10-CM | POA: Insufficient documentation

## 2013-06-18 DIAGNOSIS — C8589 Other specified types of non-Hodgkin lymphoma, extranodal and solid organ sites: Secondary | ICD-10-CM | POA: Insufficient documentation

## 2013-06-18 DIAGNOSIS — I059 Rheumatic mitral valve disease, unspecified: Secondary | ICD-10-CM | POA: Insufficient documentation

## 2013-06-18 DIAGNOSIS — I309 Acute pericarditis, unspecified: Secondary | ICD-10-CM | POA: Diagnosis not present

## 2013-06-18 DIAGNOSIS — I2510 Atherosclerotic heart disease of native coronary artery without angina pectoris: Secondary | ICD-10-CM | POA: Diagnosis not present

## 2013-06-18 DIAGNOSIS — I471 Supraventricular tachycardia, unspecified: Secondary | ICD-10-CM | POA: Insufficient documentation

## 2013-06-18 DIAGNOSIS — I517 Cardiomegaly: Secondary | ICD-10-CM

## 2013-06-18 NOTE — Progress Notes (Signed)
  Echocardiogram 2D Echocardiogram has been performed.  Anthony Kelly 06/18/2013, 9:11 AM

## 2013-06-20 ENCOUNTER — Telehealth: Payer: Self-pay | Admitting: *Deleted

## 2013-06-20 ENCOUNTER — Other Ambulatory Visit: Payer: Self-pay | Admitting: Hematology and Oncology

## 2013-06-20 DIAGNOSIS — C8510 Unspecified B-cell lymphoma, unspecified site: Secondary | ICD-10-CM

## 2013-06-20 DIAGNOSIS — C8299 Follicular lymphoma, unspecified, extranodal and solid organ sites: Secondary | ICD-10-CM

## 2013-06-20 NOTE — Telephone Encounter (Signed)
Wife states pt decided not to have another second opinion.  Since he has seen Dr. Beryle Beams and Dr. Alvy Bimler he thinks he has had enough opinions to proceed w/ treatment.

## 2013-06-20 NOTE — Telephone Encounter (Signed)
pls schedule BM either on 6/11, 6/15 or 6/18 sedated or unsedated per pt preference I will place order for rtn visit once I know when the BM will be and discuss plus consent chemo, likely Ritux with benda.

## 2013-06-20 NOTE — Telephone Encounter (Signed)
Orders placed.

## 2013-06-20 NOTE — Telephone Encounter (Signed)
Wife states pt prefers sedated biopsy.  BMBx scheduled for 6/18 at 8 am at [facility] Stay Dept.. Instructed wife to arrive at 7 am,  Pt NPO after midnight and needs driver home.  She verbalized understanding and reports his Port a cath scheduled for 6/15.   Informed her scheduler will call w/ office visit w/ Dr. Alvy Bimler.

## 2013-06-20 NOTE — Telephone Encounter (Signed)
Wife states pt is ready to start treatment.  She says she thinks he needs a Bone Marrow Biopsy and Port a cath placed?

## 2013-06-20 NOTE — Telephone Encounter (Signed)
Has he been to [facility]?

## 2013-06-21 ENCOUNTER — Encounter (HOSPITAL_COMMUNITY): Payer: Self-pay | Admitting: Pharmacy Technician

## 2013-06-22 ENCOUNTER — Other Ambulatory Visit: Payer: Self-pay | Admitting: Radiology

## 2013-06-23 ENCOUNTER — Telehealth: Payer: Self-pay | Admitting: Hematology and Oncology

## 2013-06-25 ENCOUNTER — Encounter (HOSPITAL_COMMUNITY): Payer: Self-pay

## 2013-06-25 ENCOUNTER — Ambulatory Visit (HOSPITAL_COMMUNITY)
Admission: RE | Admit: 2013-06-25 | Discharge: 2013-06-25 | Disposition: A | Discharge status: Home / Self Care | Payer: Medicare Other | Source: Ambulatory Visit | Attending: Hematology and Oncology | Admitting: Hematology and Oncology

## 2013-06-25 ENCOUNTER — Other Ambulatory Visit: Payer: Self-pay | Admitting: Hematology and Oncology

## 2013-06-25 ENCOUNTER — Ambulatory Visit: Payer: Medicare Other | Admitting: Internal Medicine

## 2013-06-25 DIAGNOSIS — I471 Supraventricular tachycardia, unspecified: Secondary | ICD-10-CM | POA: Diagnosis not present

## 2013-06-25 DIAGNOSIS — C8510 Unspecified B-cell lymphoma, unspecified site: Secondary | ICD-10-CM

## 2013-06-25 DIAGNOSIS — Z803 Family history of malignant neoplasm of breast: Secondary | ICD-10-CM | POA: Insufficient documentation

## 2013-06-25 DIAGNOSIS — Z9089 Acquired absence of other organs: Secondary | ICD-10-CM | POA: Insufficient documentation

## 2013-06-25 DIAGNOSIS — T1500XA Foreign body in cornea, unspecified eye, initial encounter: Secondary | ICD-10-CM | POA: Diagnosis not present

## 2013-06-25 DIAGNOSIS — Z7982 Long term (current) use of aspirin: Secondary | ICD-10-CM | POA: Diagnosis not present

## 2013-06-25 DIAGNOSIS — Z902 Acquired absence of lung [part of]: Secondary | ICD-10-CM | POA: Diagnosis not present

## 2013-06-25 DIAGNOSIS — Z79899 Other long term (current) drug therapy: Secondary | ICD-10-CM | POA: Insufficient documentation

## 2013-06-25 DIAGNOSIS — Z8042 Family history of malignant neoplasm of prostate: Secondary | ICD-10-CM | POA: Insufficient documentation

## 2013-06-25 DIAGNOSIS — S058X9A Other injuries of unspecified eye and orbit, initial encounter: Secondary | ICD-10-CM | POA: Diagnosis not present

## 2013-06-25 DIAGNOSIS — C8589 Other specified types of non-Hodgkin lymphoma, extranodal and solid organ sites: Secondary | ICD-10-CM | POA: Diagnosis present

## 2013-06-25 LAB — PROTIME-INR
INR: 1.06 (ref 0.00–1.49)
PROTHROMBIN TIME: 13.6 s (ref 11.6–15.2)

## 2013-06-25 LAB — CBC
HCT: 46.8 % (ref 39.0–52.0)
Hemoglobin: 15.9 g/dL (ref 13.0–17.0)
MCH: 29.3 pg (ref 26.0–34.0)
MCHC: 34 g/dL (ref 30.0–36.0)
MCV: 86.3 fL (ref 78.0–100.0)
Platelets: 139 10*3/uL — ABNORMAL LOW (ref 150–400)
RBC: 5.42 MIL/uL (ref 4.22–5.81)
RDW: 12.9 % (ref 11.5–15.5)
WBC: 7.2 10*3/uL (ref 4.0–10.5)

## 2013-06-25 LAB — APTT: APTT: 29 s (ref 24–37)

## 2013-06-25 IMAGING — US IR FLUORO GUIDE CV LINE*R*
2 series · 3 of 3 positions shown · non-contrast
Comparison: none

CLINICAL DATA: 67-year-old male with B-cell lymphoma in need of
durable central venous access for chemotherapy.
TECHNIQUE: The right neck and chest was prepped with chlorhexidine, and draped
in the usual sterile fashion using maximum barrier technique (cap
and mask, sterile gown, sterile gloves, large sterile sheet, hand
hygiene and cutaneous antiseptic). Antibiotic prophylaxis was
provided with 2g Ancef administered IV one hour prior to skin
incision. Local anesthesia was attained by infiltration with 1%
lidocaine with epinephrine.

[Series 1: ir fluoro guide cv line*right* · 2 of 2 slices shown]
[im 1/2]
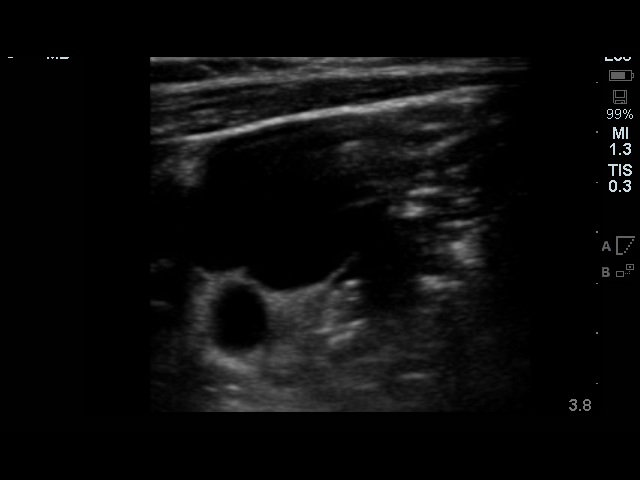
[im 2/2]
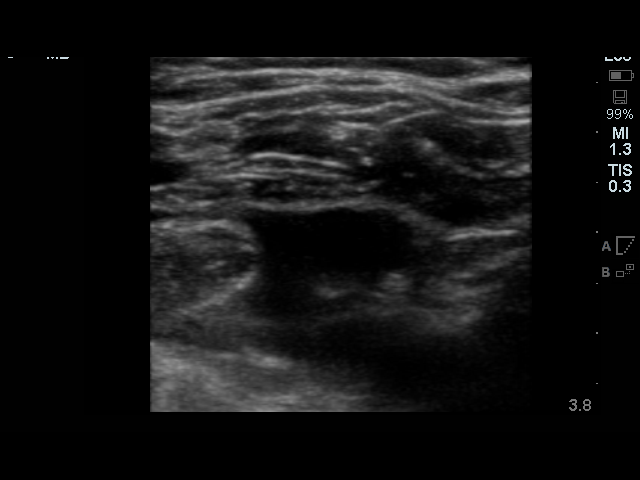

[Series 1: care single · 1 of 1 slices shown]
[im 1/1]
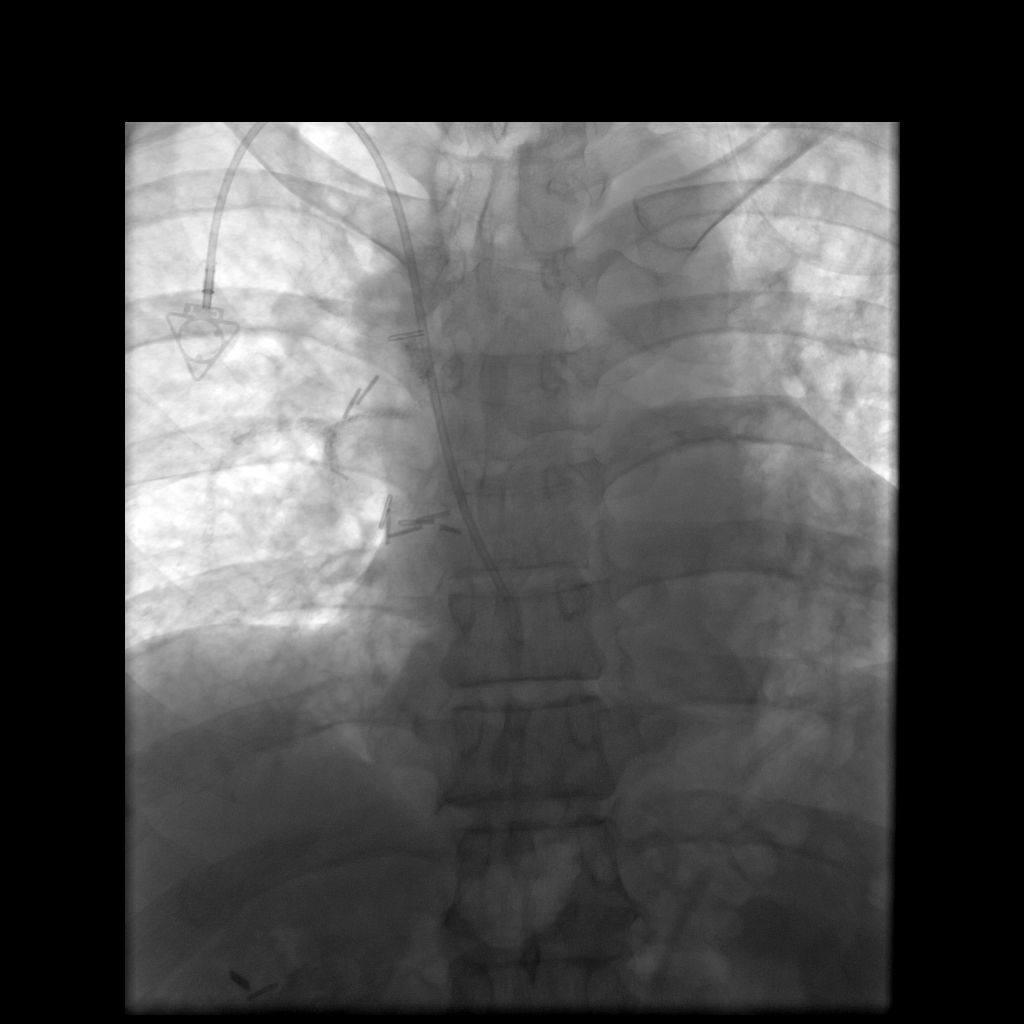

[3 of 3 positions shown; findings below may reference images not displayed]

EXAM:
IR RIGHT FLOURO GUIDE CV LINE; IR ULTRASOUND GUIDANCE VASC ACCESS
RIGHT

Date: [DATE]

ANESTHESIA/SEDATION:
Moderate (conscious) sedation was administered during this
procedure. A total of Three mg Versed and 100 mg Fentanyl were
administered intravenously. The patient's vital signs were monitored
continuously by radiology nursing throughout the course of the
procedure.

Total sedation time: 30 minutes

FLUOROSCOPY TIME:  6 seconds
Ultrasound demonstrated patency of the right internal jugular vein,
and this was documented with an image. Under real-time ultrasound
guidance, this vein was accessed with a 21 gauge micropuncture
needle and image documentation was performed. A small dermatotomy
was made at the access site with an 11 scalpel. A 0.018" wire was
advanced into the SVC and the access needle exchanged for a 4F
micropuncture vascular sheath. The 0.018" wire was then removed and
a 0.035" wire advanced into the IVC.





The pocket was then closed in two layers using first subdermal
inverted interrupted absorbable sutures followed by a running
subcuticular suture. The epidermis was then sealed with Dermabond.
The dermatotomy at the venous access site was also closed with a
single inverted subdermal suture and the epidermis sealed with
Dermabond.

COMPLICATIONS:
None.  The patient tolerated the procedure well.
IMPRESSION: Successful placement of a right IJ approach Power Port with
ultrasound and fluoroscopic guidance. The catheter is ready for use.

## 2013-06-25 MED ORDER — MIDAZOLAM HCL 2 MG/2ML IJ SOLN
INTRAMUSCULAR | Status: AC
  Filled 2013-06-25: qty 4

## 2013-06-25 MED ORDER — FENTANYL CITRATE 0.05 MG/ML IJ SOLN
INTRAMUSCULAR | Status: AC | PRN
  Administered 2013-06-25: 100 ug via INTRAVENOUS

## 2013-06-25 MED ORDER — LIDOCAINE HCL 1 % IJ SOLN
INTRAMUSCULAR | Status: AC
  Filled 2013-06-25: qty 20

## 2013-06-25 MED ORDER — HEPARIN SOD (PORK) LOCK FLUSH 100 UNIT/ML IV SOLN
INTRAVENOUS | Status: AC
  Filled 2013-06-25: qty 5

## 2013-06-25 MED ORDER — HEPARIN SOD (PORK) LOCK FLUSH 100 UNIT/ML IV SOLN
500.0000 [IU] | Freq: Once | INTRAVENOUS | Status: AC
  Administered 2013-06-25: 500 [IU] via INTRAVENOUS

## 2013-06-25 MED ORDER — SODIUM CHLORIDE 0.9 % IV SOLN
INTRAVENOUS | Status: DC
  Administered 2013-06-25: 09:00:00 via INTRAVENOUS

## 2013-06-25 MED ORDER — CEFAZOLIN SODIUM-DEXTROSE 2-3 GM-% IV SOLR
2.0000 g | INTRAVENOUS | Status: AC
  Administered 2013-06-25: 2 g via INTRAVENOUS
  Filled 2013-06-25 (×2): qty 50

## 2013-06-25 MED ORDER — LIDOCAINE-EPINEPHRINE (PF) 2 %-1:200000 IJ SOLN
INTRAMUSCULAR | Status: AC
  Filled 2013-06-25: qty 20

## 2013-06-25 MED ORDER — FENTANYL CITRATE 0.05 MG/ML IJ SOLN
INTRAMUSCULAR | Status: AC
  Filled 2013-06-25: qty 4

## 2013-06-25 MED ORDER — MIDAZOLAM HCL 2 MG/2ML IJ SOLN
INTRAMUSCULAR | Status: AC | PRN
  Administered 2013-06-25: 1 mg via INTRAVENOUS
  Administered 2013-06-25: 2 mg via INTRAVENOUS

## 2013-06-25 NOTE — H&P (Signed)
Anthony Kelly is an 68 y.o. male.   Chief Complaint: "I'm having a port a cath put in": HPI: Patient with history of low grade B cell lymphoma presents today for port a cath placement for chemotherapy.  Past Medical History  Diagnosis Date  . PSVT (paroxysmal supraventricular tachycardia)   . Arthritis   . 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 Metastasis to lymph nodes dx'd 03/2011    lt submandibular ln  . Atrial fibrillation     a. Isolated episode in the setting of acute pericarditis 04/2013. Was not placed on anticoag.  Marland Kitchen Acute pericarditis     a. 04/2013 -adm with CP, elevated CRP. H/o coronary artery calcification on prior CT but nuc was negative, EF 71%.    Past Surgical History  Procedure Laterality Date  . Lung lobectomy      right side  . Cholecystectomy    . Lymph node biopsy      in groin with removal  . Meniscus repair      right knee  . Vein ligation and stripping      right leg  . Tonsillectomy      as a child  . Submandibular gland excision  04/2011  . Submandibular gland excision  04/30/2011    Procedure: EXCISION SUBMANDIBULAR GLAND;  Surgeon: Jerrell Belfast, MD;  Location: [place];  Service: ENT;  Laterality: Left;  WITH DIAGNOSTIC BIOPSY  . Exploratory laparotomy      Family History  Problem Relation Age of Onset  . Anesthesia problems Neg Hx   . Heart disease Father   . Cancer Sister     breast ca  . Cancer Brother     prostate ca   Social History:  reports that he quit smoking about 23 years ago. He has never used smokeless tobacco. He reports that he does not drink alcohol or use illicit drugs.  Allergies: No Known Allergies  Current outpatient  prescriptions:aspirin EC 81 MG tablet, Take 81 mg by mouth every morning., Disp: , Rfl: ;  atorvastatin (LIPITOR) 10 MG tablet, Take 5 mg by mouth at bedtime. , Disp: , Rfl: ;  colchicine 0.6 MG tablet, Take 1 tablet (0.6 mg total) by mouth 2 (two) times daily. For 3 months., Disp: 180 tablet, Rfl: 1;  diltiazem (CARDIZEM CD) 120 MG 24 hr capsule, Take 120 mg by mouth every morning., Disp: , Rfl:  fluticasone (FLONASE) 50 MCG/ACT nasal spray, Place 1 spray into both nostrils daily., Disp: , Rfl: ;  Menthol-Methyl Salicylate (MUSCLE RUB) 10-15 % CREA, Apply 1 application topically as needed for muscle pain., Disp: , Rfl: ;  metoprolol succinate (TOPROL-XL) 25 MG 24 hr tablet, Take 25 mg by mouth every morning., Disp: , Rfl: ;  Multiple Vitamin (MULTIVITAMIN WITH MINERALS) TABS tablet, Take 1 tablet by mouth daily., Disp: , Rfl:  Current facility-administered medications:0.9 %  sodium chloride infusion, , Intravenous, Continuous, D Kevin Wateen Varon, PA-C, Last Rate: 50 mL/hr at 06/25/13 0853;  ceFAZolin (ANCEF) IVPB 2 g/50 mL premix, 2 g, Intravenous, On Call, D Kevin Orlando Devereux, PA-C  No results found for this or any previous visit (from the past 48 hour(s)). No results found.  Review  of Systems  Constitutional: Negative for fever and chills.  Eyes: Positive for pain.       Recent foreign body into rt eye with increased tearing/pain  Respiratory: Negative for cough and shortness of breath.   Cardiovascular: Negative for chest pain.  Gastrointestinal: Negative for nausea, vomiting and abdominal pain.  Genitourinary: Negative for dysuria and hematuria.  Musculoskeletal: Negative for back pain.  Neurological: Negative for headaches.  Endo/Heme/Allergies: Does not bruise/bleed easily.    Blood pressure 125/76, pulse 58, temperature 97.5 F (36.4 C), temperature source Oral, resp. rate 18, SpO2 100.00%. Physical Exam  Constitutional: He is oriented to person, place, and time. He appears well-developed and  well-nourished.  Eyes:  Rt eye with increased tearing/conjuctival erythema/?foreign body medial portion  Cardiovascular: Normal rate and regular rhythm.   Respiratory: Effort normal and breath sounds normal.  GI: Soft. Bowel sounds are normal. There is no tenderness.  Musculoskeletal: Normal range of motion. He exhibits no edema.  Neurological: He is alert and oriented to person, place, and time.     Assessment/Plan Patient with history of low grade B cell lymphoma presents today for port a cath placement for chemotherapy. Details/risks of procedure d/w pt/wife with their understanding and consent. Pt is scheduled to see optometrist this afternoon regarding rt eye redness/pain/?foreign body.  Anthony Kelly,D KEVIN 06/25/2013, 9:06 AM

## 2013-06-25 NOTE — H&P (Signed)
Pt also has right corneal irritation with small foreign body in central cornea.  Pt to report to optometrist later today.   Signed,  Criselda Peaches, MD [facility]

## 2013-06-25 NOTE — Discharge Instructions (Signed)

## 2013-06-25 NOTE — Procedures (Signed)
Interventional Radiology Procedure Note  Procedure: Placement of a right IJ approach single lumen PowerPort.  Tip is positioned at the superior cavoatrial junction and catheter is ready for immediate use.  Complications: No immediate Recommendations:  - Ok to shower tomorrow - Do not submerge for 7 days - Routine line care   Signed,  Criselda Peaches, MD [facility]

## 2013-06-27 ENCOUNTER — Telehealth: Payer: Self-pay | Admitting: *Deleted

## 2013-06-27 DIAGNOSIS — S058X9A Other injuries of unspecified eye and orbit, initial encounter: Secondary | ICD-10-CM | POA: Diagnosis not present

## 2013-06-27 NOTE — Telephone Encounter (Signed)
Wife called for directions to Short Stay dept at [facility] for pt's BMBx tomorrow.  Directions given and she verbalized understanding.

## 2013-06-28 ENCOUNTER — Encounter (HOSPITAL_COMMUNITY): Payer: Self-pay

## 2013-06-28 ENCOUNTER — Other Ambulatory Visit: Payer: Self-pay | Admitting: Hematology and Oncology

## 2013-06-28 ENCOUNTER — Ambulatory Visit (HOSPITAL_COMMUNITY)
Admission: RE | Admit: 2013-06-28 | Discharge: 2013-06-28 | Disposition: A | Discharge status: Home / Self Care | Payer: Medicare Other | Source: Ambulatory Visit | Attending: Hematology and Oncology | Admitting: Hematology and Oncology

## 2013-06-28 VITALS — BP 104/62 | HR 75 | Temp 98.2°F | Resp 16 | Ht 72.0 in | Wt 193.9 lb

## 2013-06-28 DIAGNOSIS — D696 Thrombocytopenia, unspecified: Secondary | ICD-10-CM | POA: Insufficient documentation

## 2013-06-28 DIAGNOSIS — C8510 Unspecified B-cell lymphoma, unspecified site: Secondary | ICD-10-CM

## 2013-06-28 DIAGNOSIS — D704 Cyclic neutropenia: Secondary | ICD-10-CM | POA: Diagnosis not present

## 2013-06-28 DIAGNOSIS — C8299 Follicular lymphoma, unspecified, extranodal and solid organ sites: Secondary | ICD-10-CM

## 2013-06-28 DIAGNOSIS — C8589 Other specified types of non-Hodgkin lymphoma, extranodal and solid organ sites: Secondary | ICD-10-CM | POA: Diagnosis not present

## 2013-06-28 DIAGNOSIS — D649 Anemia, unspecified: Secondary | ICD-10-CM | POA: Diagnosis not present

## 2013-06-28 LAB — COMPREHENSIVE METABOLIC PANEL
ALT: 26 U/L (ref 0–53)
AST: 23 U/L (ref 0–37)
Albumin: 3.8 g/dL (ref 3.5–5.2)
Alkaline Phosphatase: 64 U/L (ref 39–117)
BUN: 19 mg/dL (ref 6–23)
CALCIUM: 9 mg/dL (ref 8.4–10.5)
CO2: 24 meq/L (ref 19–32)
Chloride: 103 mEq/L (ref 96–112)
Creatinine, Ser: 1.06 mg/dL (ref 0.50–1.35)
GFR, EST AFRICAN AMERICAN: 81 mL/min — AB (ref >90)
GFR, EST NON AFRICAN AMERICAN: 70 mL/min — AB (ref >90)
GLUCOSE: 111 mg/dL — AB (ref 70–99)
POTASSIUM: 3.9 meq/L (ref 3.7–5.3)
SODIUM: 140 meq/L (ref 137–147)
Total Bilirubin: 0.6 mg/dL (ref 0.3–1.2)
Total Protein: 6.5 g/dL (ref 6.0–8.3)

## 2013-06-28 LAB — CBC
HCT: 46.3 % (ref 39.0–52.0)
HEMOGLOBIN: 15.9 g/dL (ref 13.0–17.0)
MCH: 29.6 pg (ref 26.0–34.0)
MCHC: 34.3 g/dL (ref 30.0–36.0)
MCV: 86.2 fL (ref 78.0–100.0)
Platelets: 146 10*3/uL — ABNORMAL LOW (ref 150–400)
RBC: 5.37 MIL/uL (ref 4.22–5.81)
RDW: 12.9 % (ref 11.5–15.5)
WBC: 9.7 10*3/uL (ref 4.0–10.5)

## 2013-06-28 LAB — BONE MARROW EXAM

## 2013-06-28 LAB — HEPATITIS B CORE ANTIBODY, IGM: Hep B C IgM: NONREACTIVE

## 2013-06-28 LAB — HEPATITIS B SURFACE ANTIGEN: HEP B S AG: NEGATIVE

## 2013-06-28 LAB — HEPATITIS B SURFACE ANTIBODY,QUALITATIVE: Hep B S Ab: NEGATIVE

## 2013-06-28 LAB — LACTATE DEHYDROGENASE: LDH: 138 U/L (ref 94–250)

## 2013-06-28 LAB — URIC ACID: Uric Acid, Serum: 4.5 mg/dL (ref 4.0–7.8)

## 2013-06-28 MED ORDER — MORPHINE SULFATE 10 MG/ML IJ SOLN
10.0000 mg | Freq: Once | INTRAMUSCULAR | Status: DC
  Filled 2013-06-28: qty 1

## 2013-06-28 MED ORDER — ACYCLOVIR 400 MG PO TABS: 400.0000 mg | ORAL_TABLET | Freq: Every day | ORAL | Status: DC

## 2013-06-28 MED ORDER — MORPHINE SULFATE 10 MG/ML IJ SOLN
INTRAMUSCULAR | Status: AC | PRN
  Administered 2013-06-28: 2 mg via INTRAVENOUS
  Administered 2013-06-28: 4 mg via INTRAVENOUS

## 2013-06-28 MED ORDER — HEPARIN SOD (PORK) LOCK FLUSH 100 UNIT/ML IV SOLN
500.0000 [IU] | INTRAVENOUS | Status: AC | PRN
  Administered 2013-06-28: 500 [IU]
  Filled 2013-06-28: qty 5

## 2013-06-28 MED ORDER — ONDANSETRON 8 MG/NS 50 ML IVPB
8.0000 mg | Freq: Once | INTRAVENOUS | Status: AC
  Administered 2013-06-28: 8 mg via INTRAVENOUS
  Filled 2013-06-28 (×2): qty 8

## 2013-06-28 MED ORDER — ALLOPURINOL 300 MG PO TABS: 300.0000 mg | ORAL_TABLET | Freq: Every day | ORAL | Status: DC

## 2013-06-28 MED ORDER — ONDANSETRON HCL 8 MG PO TABS: 8.0000 mg | ORAL_TABLET | Freq: Three times a day (TID) | ORAL | Status: DC | PRN

## 2013-06-28 MED ORDER — PROMETHAZINE HCL 25 MG PO TABS: 25.0000 mg | ORAL_TABLET | Freq: Four times a day (QID) | ORAL | Status: DC | PRN

## 2013-06-28 MED ORDER — SODIUM CHLORIDE 0.9 % IV SOLN
INTRAVENOUS | Status: DC
  Administered 2013-06-28: 10 mL/h via INTRAVENOUS

## 2013-06-28 MED ORDER — MIDAZOLAM HCL 10 MG/2ML IJ SOLN
10.0000 mg | Freq: Once | INTRAMUSCULAR | Status: DC
  Filled 2013-06-28: qty 2

## 2013-06-28 MED ORDER — MIDAZOLAM HCL 2 MG/2ML IJ SOLN
INTRAMUSCULAR | Status: AC | PRN
  Administered 2013-06-28: 6 mg via INTRAVENOUS

## 2013-06-28 MED ORDER — LIDOCAINE-PRILOCAINE 2.5-2.5 % EX CREA: 1.0000 "application " | TOPICAL_CREAM | CUTANEOUS | Status: DC | PRN

## 2013-06-28 NOTE — Discharge Instructions (Signed)

## 2013-06-28 NOTE — Sedation Documentation (Signed)
Patient is resting comfortably. 

## 2013-06-28 NOTE — Procedures (Signed)
Brief examination was performed. ENT: adequate airway clearance Heart: regular rate and rhythm.No Murmurs Lungs: clear to auscultation, no wheezes, normal respiratory effort  American Society of Anesthesiologists ASA scale 1  Mallampati Score of 1  Bone Marrow Biopsy and Aspiration Procedure Note   Informed consent was obtained and potential risks including bleeding, infection and pain were reviewed with the patient. I verified that the patient has been fasting since midnight.  The patient's name, date of birth, identification, consent and allergies were verified prior to the start of procedure and time out was performed.  A total of 6 mg of IV Versed and 6 mg of IV morphine were given.  The right posterior iliac crest was chosen as the site of biopsy.  The skin was prepped with Betadine solution.   8 cc of 1% lidocaine was used to provide local anaesthesia.   10 cc of bone marrow aspirate was obtained followed by 1 inch biopsy.   The procedure was tolerated well and there were no complications.  The patient was stable at the end of the procedure.  Specimens sent for flow cytometry, cytogenetics and additional studies.

## 2013-07-02 DIAGNOSIS — S058X9A Other injuries of unspecified eye and orbit, initial encounter: Secondary | ICD-10-CM | POA: Diagnosis not present

## 2013-07-03 ENCOUNTER — Telehealth: Payer: Self-pay | Admitting: Hematology and Oncology

## 2013-07-03 ENCOUNTER — Ambulatory Visit (HOSPITAL_BASED_OUTPATIENT_CLINIC_OR_DEPARTMENT_OTHER): Payer: Medicare Other | Admitting: Hematology and Oncology

## 2013-07-03 ENCOUNTER — Encounter: Payer: Self-pay | Admitting: Hematology and Oncology

## 2013-07-03 VITALS — BP 124/72 | HR 73 | Temp 96.5°F | Resp 18 | Ht 72.0 in | Wt 196.4 lb

## 2013-07-03 DIAGNOSIS — I309 Acute pericarditis, unspecified: Secondary | ICD-10-CM

## 2013-07-03 DIAGNOSIS — C8510 Unspecified B-cell lymphoma, unspecified site: Secondary | ICD-10-CM

## 2013-07-03 DIAGNOSIS — C8589 Other specified types of non-Hodgkin lymphoma, extranodal and solid organ sites: Secondary | ICD-10-CM

## 2013-07-03 MED ORDER — ACYCLOVIR 400 MG PO TABS: 400.0000 mg | ORAL_TABLET | Freq: Every day | ORAL | Status: DC

## 2013-07-03 NOTE — Assessment & Plan Note (Signed)
He is not symptomatic. I have touched bases with his cardiologist who felt it is okay to reduce colchicine to once a day.

## 2013-07-03 NOTE — Telephone Encounter (Signed)
gv and pritned appt sched and avs for pt for June and July....sed added tx.

## 2013-07-03 NOTE — Progress Notes (Signed)
[facility] OFFICE PROGRESS NOTE  Patient Care Team: Lysbeth Penner, FNP as PCP - General (Family Medicine) Fay Records, MD as Attending Physician (Cardiology)  SUMMARY OF ONCOLOGIC HISTORY: He was initially presented with left inguinal lymphadenopathy in June 2004 with biopsy showing low grade non-Hodgkin's lymphoma,  Follicular subtype.Marland Kitchen He was treated with observation alone until November 2005 when the lymph node mass began to grow and repeat biopsy showed conversion to high-grade lymphoma. He achieved a complete remission with CHOP Rituxan. He developed a single right pulmonary lesion in November 2008 found on a routine followup scan. This was surgically resected 12/19/2006 and showed low-grade non-Hodgkin's lymphoma. He was treated with Rituxan intermittently for 2 years between December 2008 in August 2010. At time of a routine followup visit in March of 2013 he pointed out an enlarged left submandibular gland which he had noted for about one month. CT scans of the neck chest abdomen and pelvis showed an isolated lesion within the left submandibular gland. He was referred to ear nose and throat surgery and underwent excision of the gland on 04/30/2011 by Dr. Jerrell Belfast. Findings were consistent with an isolated focus of low-grade non-Hodgkin's lymphoma. Since the remainder of his restaging evaluation was unremarkable and the only site of obvious disease was surgically removed, he was not put back on any specific treatment. He has been followed with clinical exam and every 3 -four-month CT scans. He has a number of small mesenteric and retroperitoneal lymph nodes which have had minor fluctuations in size but overall have remained stable in an otherwise asymptomatic patient On 06/07/2013, repeat PET/CT scan showed diffuse disease. The patient also sustained pericarditis recently which I suspect is related to disease. INTERVAL HISTORY: Please see below for problem oriented  charting. He is not symptomatic with no chest pain.   REVIEW OF SYSTEMS:   Constitutional: Denies fevers, chills or abnormal weight loss Eyes: Denies blurriness of vision Ears, nose, mouth, throat, and face: Denies mucositis or sore throat Respiratory: Denies cough, dyspnea or wheezes Cardiovascular: Denies palpitation, chest discomfort or lower extremity swelling Gastrointestinal:  Denies nausea, heartburn or change in bowel habits Skin: Denies abnormal skin rashes Lymphatics: Denies new lymphadenopathy or easy bruising Neurological:Denies numbness, tingling or new weaknesses Behavioral/Psych: Mood is stable, no new changes  All other systems were reviewed with the patient and are negative.  I have reviewed the past medical history, past surgical history, social history and family history with the patient and they are unchanged from previous note.  ALLERGIES:  has No Known Allergies.  MEDICATIONS:  Current Outpatient Prescriptions  Medication Sig Dispense Refill  . acyclovir (ZOVIRAX) 400 MG tablet Take 1 tablet (400 mg total) by mouth daily.  90 tablet  1  . allopurinol (ZYLOPRIM) 300 MG tablet Take 1 tablet (300 mg total) by mouth daily.  30 tablet  0  . aspirin EC 81 MG tablet Take 81 mg by mouth every morning.      Marland Kitchen atorvastatin (LIPITOR) 10 MG tablet Take 5 mg by mouth at bedtime.       . colchicine 0.6 MG tablet Take 1 tablet (0.6 mg total) by mouth 2 (two) times daily. For 3 months.  180 tablet  1  . diltiazem (CARDIZEM CD) 120 MG 24 hr capsule Take 120 mg by mouth every morning.      . lidocaine-prilocaine (EMLA) cream Apply 1 application topically as needed.  30 g  0  . Menthol-Methyl Salicylate (MUSCLE RUB) 10-15 %  CREA Apply 1 application topically as needed for muscle pain.      . metoprolol succinate (TOPROL-XL) 25 MG 24 hr tablet Take 25 mg by mouth every morning.      . Multiple Vitamin (MULTIVITAMIN WITH MINERALS) TABS tablet Take 1 tablet by mouth daily.      Marland Kitchen  trimethoprim-polymyxin b (POLYTRIM) ophthalmic solution 4 (four) times daily.      . fluticasone (FLONASE) 50 MCG/ACT nasal spray Place 1 spray into both nostrils daily.      . ondansetron (ZOFRAN) 8 MG tablet Take 1 tablet (8 mg total) by mouth every 8 (eight) hours as needed for nausea.  30 tablet  3  . promethazine (PHENERGAN) 25 MG tablet Take 1 tablet (25 mg total) by mouth every 6 (six) hours as needed for nausea.  30 tablet  3   No current facility-administered medications for this visit.    PHYSICAL EXAMINATION: ECOG PERFORMANCE STATUS: 0 - Asymptomatic  Filed Vitals:   07/03/13 1329  BP: 124/72  Pulse: 73  Temp: 96.5 F (35.8 C)  Resp: 18   Filed Weights   07/03/13 1329  Weight: 196 lb 6.4 oz (89.086 kg)    GENERAL:alert, no distress and comfortable SKIN: skin color, texture, turgor are normal, no rashes or significant lesions EYES: normal, Conjunctiva are pink and non-injected, sclera clear OROPHARYNX:no exudate, no erythema and lips, buccal mucosa, and tongue normal  Musculoskeletal:no cyanosis of digits and no clubbing  NEURO: alert & oriented x 3 with fluent speech, no focal motor/sensory deficits  LABORATORY DATA:  I have reviewed the data as listed    Component Value Date/Time   NA 140 06/28/2013 0705   NA 141 06/07/2013 0824   NA 139 07/20/2011 0800   K 3.9 06/28/2013 0705   K 4.4 06/07/2013 0824   K 4.4 07/20/2011 0800   CL 103 06/28/2013 0705   CL 104 03/24/2012 1024   CL 100 07/20/2011 0800   CO2 24 06/28/2013 0705   CO2 24 06/07/2013 0824   CO2 29 07/20/2011 0800   GLUCOSE 111* 06/28/2013 0705   GLUCOSE 95 06/07/2013 0824   GLUCOSE 80 03/24/2012 1024   GLUCOSE 100 07/20/2011 0800   BUN 19 06/28/2013 0705   BUN 18.0 06/07/2013 0824   BUN 14 07/20/2011 0800   CREATININE 1.06 06/28/2013 0705   CREATININE 1.0 06/07/2013 0824   CREATININE 1.2 07/20/2011 0800   CALCIUM 9.0 06/28/2013 0705   CALCIUM 8.9 06/07/2013 0824   CALCIUM 8.7 07/20/2011 0800   PROT 6.5 06/28/2013 0705    PROT 6.6 06/07/2013 0824   PROT 7.2 07/20/2011 0800   ALBUMIN 3.8 06/28/2013 0705   ALBUMIN 4.0 06/07/2013 0824   AST 23 06/28/2013 0705   AST 20 06/07/2013 0824   AST 25 07/20/2011 0800   ALT 26 06/28/2013 0705   ALT 24 06/07/2013 0824   ALT 29 07/20/2011 0800   ALKPHOS 64 06/28/2013 0705   ALKPHOS 74 06/07/2013 0824   ALKPHOS 55 07/20/2011 0800   BILITOT 0.6 06/28/2013 0705   BILITOT 0.64 06/07/2013 0824   BILITOT 1.00 07/20/2011 0800   GFRNONAA 70* 06/28/2013 0705   GFRAA 81* 06/28/2013 0705    No results found for this basename: SPEP,  UPEP,   kappa and lambda light chains    Lab Results  Component Value Date   WBC 9.7 06/28/2013   NEUTROABS 4.9 06/07/2013   HGB 15.9 06/28/2013   HCT 46.3 06/28/2013   MCV 86.2 06/28/2013  PLT 146* 06/28/2013      Chemistry      Component Value Date/Time   NA 140 06/28/2013 0705   NA 141 06/07/2013 0824   NA 139 07/20/2011 0800   K 3.9 06/28/2013 0705   K 4.4 06/07/2013 0824   K 4.4 07/20/2011 0800   CL 103 06/28/2013 0705   CL 104 03/24/2012 1024   CL 100 07/20/2011 0800   CO2 24 06/28/2013 0705   CO2 24 06/07/2013 0824   CO2 29 07/20/2011 0800   BUN 19 06/28/2013 0705   BUN 18.0 06/07/2013 0824   BUN 14 07/20/2011 0800   CREATININE 1.06 06/28/2013 0705   CREATININE 1.0 06/07/2013 0824   CREATININE 1.2 07/20/2011 0800      Component Value Date/Time   CALCIUM 9.0 06/28/2013 0705   CALCIUM 8.9 06/07/2013 0824   CALCIUM 8.7 07/20/2011 0800   ALKPHOS 64 06/28/2013 0705   ALKPHOS 74 06/07/2013 0824   ALKPHOS 55 07/20/2011 0800   AST 23 06/28/2013 0705   AST 20 06/07/2013 0824   AST 25 07/20/2011 0800   ALT 26 06/28/2013 0705   ALT 24 06/07/2013 0824   ALT 29 07/20/2011 0800   BILITOT 0.6 06/28/2013 0705   BILITOT 0.64 06/07/2013 0824   BILITOT 1.00 07/20/2011 0800      ASSESSMENT & PLAN:  Low grade B-cell lymphoma Bone marrow biopsy show possible lymphoma involvement although this was not clear to me. Overall, the patient had diffuse disease and I suspect the pericarditis was  related to disease. 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 I felt the patient will benefit from bendamustine with rituximab. We discussed the role of chemotherapy. The intent is for palliative.  We discussed some of the risks, benefits and side-effects of Rituximab with Bendamustine  Some of the short term side-effects included, though not limited to, risk of fatigue, weight loss, tumor lysis syndrome, risk of allergic reactions, pancytopenia, life-threatening infections, need for transfusions of blood products, nausea, vomiting, change in bowel habits, hair loss, risk of congestive heart failure, admission to hospital for various reasons, and risks of death.   Long term side-effects are also discussed including permanent damage to nerve function, chronic fatigue, and rare secondary malignancy including bone marrow disorders.   The patient is aware that the response rates discussed earlier is not guaranteed.    After a long discussion, patient made an informed decision to proceed with the prescribed plan of care and went ahead to sign the consent form today.   Patient education material was dispensed    Acute pericarditis- ST elavation on EKG He is not symptomatic. I have touched bases with his cardiologist who felt it is okay to reduce colchicine to once a day.    Orders Placed This Encounter  Procedures  . Comprehensive metabolic panel    Standing Status: Standing     Number of Occurrences: 33     Standing Expiration Date: 07/04/2014  . CBC with Differential    Standing Status: Standing     Number of Occurrences: 33     Standing Expiration Date: 07/04/2014   All questions were answered. The patient knows to call the clinic with any problems, questions or concerns. No barriers to learning was detected.    [facility], [place], MD 07/03/2013 2:48 PM

## 2013-07-03 NOTE — Assessment & Plan Note (Signed)
Bone marrow biopsy show possible lymphoma involvement although this was not clear to me. Overall, the patient had diffuse disease and I suspect the pericarditis was related to disease. 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 I felt the patient will benefit from bendamustine with rituximab. We discussed the role of chemotherapy. The intent is for palliative.  We discussed some of the risks, benefits and side-effects of Rituximab with Bendamustine  Some of the short term side-effects included, though not limited to, risk of fatigue, weight loss, tumor lysis syndrome, risk of allergic reactions, pancytopenia, life-threatening infections, need for transfusions of blood products, nausea, vomiting, change in bowel habits, hair loss, risk of congestive heart failure, admission to hospital for various reasons, and risks of death.   Long term side-effects are also discussed including permanent damage to nerve function, chronic fatigue, and rare secondary malignancy including bone marrow disorders.   The patient is aware that the response rates discussed earlier is not guaranteed.    After a long discussion, patient made an informed decision to proceed with the prescribed plan of care and went ahead to sign the consent form today.   Patient education material was dispensed

## 2013-07-04 ENCOUNTER — Ambulatory Visit (HOSPITAL_BASED_OUTPATIENT_CLINIC_OR_DEPARTMENT_OTHER): Payer: Medicare Other

## 2013-07-04 VITALS — BP 97/65 | HR 74 | Temp 98.3°F | Resp 20

## 2013-07-04 DIAGNOSIS — C8589 Other specified types of non-Hodgkin lymphoma, extranodal and solid organ sites: Secondary | ICD-10-CM | POA: Diagnosis not present

## 2013-07-04 DIAGNOSIS — Z5111 Encounter for antineoplastic chemotherapy: Secondary | ICD-10-CM | POA: Diagnosis not present

## 2013-07-04 DIAGNOSIS — C8510 Unspecified B-cell lymphoma, unspecified site: Secondary | ICD-10-CM

## 2013-07-04 MED ORDER — SODIUM CHLORIDE 0.9 % IJ SOLN
10.0000 mL | INTRAMUSCULAR | Status: DC | PRN
  Administered 2013-07-04: 10 mL
  Filled 2013-07-04: qty 10

## 2013-07-04 MED ORDER — DEXAMETHASONE SODIUM PHOSPHATE 10 MG/ML IJ SOLN
INTRAMUSCULAR | Status: AC
  Filled 2013-07-04: qty 1

## 2013-07-04 MED ORDER — DIPHENHYDRAMINE HCL 25 MG PO CAPS
ORAL_CAPSULE | ORAL | Status: AC
  Filled 2013-07-04: qty 2

## 2013-07-04 MED ORDER — METHYLPREDNISOLONE SODIUM SUCC 125 MG IJ SOLR
125.0000 mg | Freq: Once | INTRAMUSCULAR | Status: AC | PRN
  Administered 2013-07-04: 125 mg via INTRAVENOUS

## 2013-07-04 MED ORDER — DIPHENHYDRAMINE HCL 25 MG PO CAPS
50.0000 mg | ORAL_CAPSULE | Freq: Once | ORAL | Status: AC
  Administered 2013-07-04: 50 mg via ORAL

## 2013-07-04 MED ORDER — ONDANSETRON 8 MG/NS 50 ML IVPB
INTRAVENOUS | Status: AC
  Filled 2013-07-04: qty 8

## 2013-07-04 MED ORDER — HEPARIN SOD (PORK) LOCK FLUSH 100 UNIT/ML IV SOLN
500.0000 [IU] | Freq: Once | INTRAVENOUS | Status: AC | PRN
  Administered 2013-07-04: 500 [IU]
  Filled 2013-07-04: qty 5

## 2013-07-04 MED ORDER — SODIUM CHLORIDE 0.9 % IV SOLN
Freq: Once | INTRAVENOUS | Status: AC
  Administered 2013-07-04: 13:00:00 via INTRAVENOUS

## 2013-07-04 MED ORDER — ONDANSETRON 8 MG/50ML IVPB (CHCC)
8.0000 mg | Freq: Once | INTRAVENOUS | Status: AC
  Administered 2013-07-04: 8 mg via INTRAVENOUS

## 2013-07-04 MED ORDER — ACETAMINOPHEN 325 MG PO TABS
650.0000 mg | ORAL_TABLET | Freq: Once | ORAL | Status: AC
  Administered 2013-07-04: 650 mg via ORAL

## 2013-07-04 MED ORDER — FAMOTIDINE IN NACL 20-0.9 MG/50ML-% IV SOLN
20.0000 mg | Freq: Once | INTRAVENOUS | Status: AC | PRN
  Administered 2013-07-04: 20 mg via INTRAVENOUS

## 2013-07-04 MED ORDER — DIPHENHYDRAMINE HCL 50 MG/ML IJ SOLN
25.0000 mg | Freq: Once | INTRAMUSCULAR | Status: AC | PRN
  Administered 2013-07-04: 25 mg via INTRAVENOUS

## 2013-07-04 MED ORDER — BENDAMUSTINE HCL CHEMO INJECTION 180 MG/2ML
90.0000 mg/m2 | Freq: Once | INTRAVENOUS | Status: AC
  Administered 2013-07-04: 189 mg via INTRAVENOUS
  Filled 2013-07-04: qty 2.1

## 2013-07-04 MED ORDER — ACETAMINOPHEN 325 MG PO TABS
ORAL_TABLET | ORAL | Status: AC
  Filled 2013-07-04: qty 2

## 2013-07-04 MED ORDER — SODIUM CHLORIDE 0.9 % IV SOLN
375.0000 mg/m2 | Freq: Once | INTRAVENOUS | Status: AC
  Administered 2013-07-04: 800 mg via INTRAVENOUS
  Filled 2013-07-04: qty 80

## 2013-07-04 MED ORDER — DEXAMETHASONE SODIUM PHOSPHATE 10 MG/ML IJ SOLN
10.0000 mg | Freq: Once | INTRAMUSCULAR | Status: AC
  Administered 2013-07-04: 10 mg via INTRAVENOUS

## 2013-07-04 MED ORDER — SODIUM CHLORIDE 0.9 % IV SOLN
Freq: Once | INTRAVENOUS | Status: AC
  Administered 2013-07-04: 10:00:00 via INTRAVENOUS

## 2013-07-04 NOTE — Patient Instructions (Signed)
[place] Discharge Instructions for Patients Receiving Chemotherapy  Today you received the following chemotherapy agents :  Rituxan,  Bendamustine.  To help prevent nausea and vomiting after your treatment, we encourage you to take your nausea medication as instructed by your physician.  Take Zofran 8mg  by mouth every 8 hours as needed for nausea.  Can also take Phenergan 25mg  by mouth every 6 hours as needed for nausea.  DO NOT Drive after taking Phenergan as it can cause drowsiness.   DO NOT Eat  Greasy  Nor  Spicy  Foods.  DO Drink lots of fluids as tolerated.   If you develop nausea and vomiting that is not controlled by your nausea medication, call the clinic.   BELOW ARE SYMPTOMS THAT SHOULD BE REPORTED IMMEDIATELY:  *FEVER GREATER THAN 100.5 F  *CHILLS WITH OR WITHOUT FEVER  NAUSEA AND VOMITING THAT IS NOT CONTROLLED WITH YOUR NAUSEA MEDICATION  *UNUSUAL SHORTNESS OF BREATH  *UNUSUAL BRUISING OR BLEEDING  TENDERNESS IN MOUTH AND THROAT WITH OR WITHOUT PRESENCE OF ULCERS  *URINARY PROBLEMS  *BOWEL PROBLEMS  UNUSUAL RASH Items with * indicate a potential emergency and should be followed up as soon as possible.  Feel free to call the clinic you have any questions or concerns. The clinic phone number is [phone].

## 2013-07-04 NOTE — Progress Notes (Signed)
Per Dr. Alvy Bimler,  Continue Rituxan infusion until 4pm today ( as long as pt is stable ), then stop.  Give Bendamustine day 1 as planned. Pt to return early on 6/25 to finish rest of Rituxan infusion and day 2 of Bendamustine.  Explanations given to pt and wife.  Both voiced understanding.  Vergia Alcon, pharmacist also notified of md's instructions.   Pt here for first time Rituxan, Bendamustine chemo.  Pt tolerated Rituxan infusion without difficulty.  Pt ambulated to bathroom stable by self.  Rituxan rate was at  150mg  - 57ml x 32 ml. 1145 -  Upon returning from bathroom, pt complained of nausea, and " feeling funny ".  Rituxan stopped immediately.  Pt vomited all food particles, c/o lower abdominal discomfort.   C/O slight breathing difficulty.  Normal saline infusing at 150cc/hr.  VSs taken and documented.  O2 applied.   Hypersensitivity reaction protocol in place.  See [facility] for emergency meds given.  Dr. Alvy Bimler notified.  MD arrived to assess pt at 12pm. Wife at chair side during entire event and aware of whole situation. 1330 -  Rituxan rechallenged at rate 100 mg - 42cc/hr x  21cc  As per md's instructions.  Pt continued to be monitored very closely. Was informed by Vergia Alcon, pharmacist who consulted with Dr. Alvy Bimler :  Pt does not need Rituxan on 07/05/13, just Bendamustine as planned.  Wife informed of same info.

## 2013-07-05 ENCOUNTER — Ambulatory Visit (HOSPITAL_BASED_OUTPATIENT_CLINIC_OR_DEPARTMENT_OTHER): Payer: Medicare Other

## 2013-07-05 VITALS — BP 129/79 | HR 92 | Temp 97.8°F | Resp 18

## 2013-07-05 DIAGNOSIS — C8589 Other specified types of non-Hodgkin lymphoma, extranodal and solid organ sites: Secondary | ICD-10-CM

## 2013-07-05 DIAGNOSIS — Z5111 Encounter for antineoplastic chemotherapy: Secondary | ICD-10-CM

## 2013-07-05 DIAGNOSIS — C8510 Unspecified B-cell lymphoma, unspecified site: Secondary | ICD-10-CM

## 2013-07-05 MED ORDER — DEXAMETHASONE SODIUM PHOSPHATE 10 MG/ML IJ SOLN
10.0000 mg | Freq: Once | INTRAMUSCULAR | Status: AC
  Administered 2013-07-05: 10 mg via INTRAVENOUS

## 2013-07-05 MED ORDER — HEPARIN SOD (PORK) LOCK FLUSH 100 UNIT/ML IV SOLN
500.0000 [IU] | Freq: Once | INTRAVENOUS | Status: AC | PRN
  Administered 2013-07-05: 500 [IU]
  Filled 2013-07-05: qty 5

## 2013-07-05 MED ORDER — ONDANSETRON 8 MG/NS 50 ML IVPB
INTRAVENOUS | Status: AC
  Filled 2013-07-05: qty 8

## 2013-07-05 MED ORDER — ONDANSETRON 8 MG/50ML IVPB (CHCC)
8.0000 mg | Freq: Once | INTRAVENOUS | Status: AC
  Administered 2013-07-05: 8 mg via INTRAVENOUS

## 2013-07-05 MED ORDER — DEXAMETHASONE SODIUM PHOSPHATE 10 MG/ML IJ SOLN
INTRAMUSCULAR | Status: AC
  Filled 2013-07-05: qty 1

## 2013-07-05 MED ORDER — SODIUM CHLORIDE 0.9 % IJ SOLN
10.0000 mL | INTRAMUSCULAR | Status: DC | PRN
  Administered 2013-07-05: 10 mL
  Filled 2013-07-05: qty 10

## 2013-07-05 MED ORDER — SODIUM CHLORIDE 0.9 % IV SOLN
90.0000 mg/m2 | Freq: Once | INTRAVENOUS | Status: AC
  Administered 2013-07-05: 189 mg via INTRAVENOUS
  Filled 2013-07-05: qty 2.1

## 2013-07-05 MED ORDER — SODIUM CHLORIDE 0.9 % IV SOLN
Freq: Once | INTRAVENOUS | Status: AC
  Administered 2013-07-05: 09:00:00 via INTRAVENOUS

## 2013-07-05 NOTE — Patient Instructions (Signed)
[place] Discharge Instructions for Patients Receiving Chemotherapy  Today you received the following chemotherapy agents: Treanda.  To help prevent nausea and vomiting after your treatment, we encourage you to take your nausea medication: Zofran. Take one twice daily for 3 days. Begin the morning of 6/26. Take Phenergan every 6 hours as needed for nausea.    If you develop nausea and vomiting that is not controlled by your nausea medication, call the clinic.   BELOW ARE SYMPTOMS THAT SHOULD BE REPORTED IMMEDIATELY:  *FEVER GREATER THAN 100.5 F  *CHILLS WITH OR WITHOUT FEVER  NAUSEA AND VOMITING THAT IS NOT CONTROLLED WITH YOUR NAUSEA MEDICATION  *UNUSUAL SHORTNESS OF BREATH  *UNUSUAL BRUISING OR BLEEDING  TENDERNESS IN MOUTH AND THROAT WITH OR WITHOUT PRESENCE OF ULCERS  *URINARY PROBLEMS  *BOWEL PROBLEMS  UNUSUAL RASH Items with * indicate a potential emergency and should be followed up as soon as possible.  Feel free to call the clinic should you have any questions or concerns. The clinic phone number is [phone].

## 2013-07-06 ENCOUNTER — Telehealth: Payer: Self-pay | Admitting: *Deleted

## 2013-07-06 ENCOUNTER — Ambulatory Visit (HOSPITAL_BASED_OUTPATIENT_CLINIC_OR_DEPARTMENT_OTHER): Payer: Medicare Other

## 2013-07-06 VITALS — BP 102/59 | HR 62 | Temp 97.9°F

## 2013-07-06 DIAGNOSIS — Z5189 Encounter for other specified aftercare: Secondary | ICD-10-CM | POA: Diagnosis not present

## 2013-07-06 DIAGNOSIS — C8510 Unspecified B-cell lymphoma, unspecified site: Secondary | ICD-10-CM

## 2013-07-06 DIAGNOSIS — C8589 Other specified types of non-Hodgkin lymphoma, extranodal and solid organ sites: Secondary | ICD-10-CM | POA: Diagnosis not present

## 2013-07-06 DIAGNOSIS — S058X9A Other injuries of unspecified eye and orbit, initial encounter: Secondary | ICD-10-CM | POA: Diagnosis not present

## 2013-07-06 MED ORDER — PEGFILGRASTIM INJECTION 6 MG/0.6ML
6.0000 mg | Freq: Once | SUBCUTANEOUS | Status: AC
  Administered 2013-07-06: 6 mg via SUBCUTANEOUS
  Filled 2013-07-06: qty 0.6

## 2013-07-06 NOTE — Telephone Encounter (Signed)
Anthony Kelly here for Neulasta injection following 1st rit/tre chemo treatment.  States that he is doing well now.  Had a reaction on his first day but has been fine since then.  No nausea, vomiting or diarrhea at this time.  Is drinking plenty of fluids and eating well.  All questions answered.  Knows to call if he has any problems or concerns.

## 2013-07-09 ENCOUNTER — Telehealth: Payer: Self-pay | Admitting: *Deleted

## 2013-07-09 NOTE — Telephone Encounter (Signed)
Wife reports pt feels constipated for last 2 days w/ hard stools.  He is taking 3 stool softeners at night. He has also take Metamucil once over the weekend.  Instructed to try Miralax once daily and increase Stool softener to two in the morning and two in evening.  Call back if not effective.  Wife verbalized understanding.

## 2013-07-10 DIAGNOSIS — S058X9A Other injuries of unspecified eye and orbit, initial encounter: Secondary | ICD-10-CM | POA: Diagnosis not present

## 2013-07-10 LAB — TISSUE HYBRIDIZATION (BONE MARROW)-NCBH

## 2013-07-10 LAB — CHROMOSOME ANALYSIS, BONE MARROW

## 2013-07-16 DIAGNOSIS — S058X9A Other injuries of unspecified eye and orbit, initial encounter: Secondary | ICD-10-CM | POA: Diagnosis not present

## 2013-07-18 ENCOUNTER — Other Ambulatory Visit: Payer: Self-pay | Admitting: Family Medicine

## 2013-07-21 DIAGNOSIS — H18239 Secondary corneal edema, unspecified eye: Secondary | ICD-10-CM | POA: Diagnosis not present

## 2013-07-21 DIAGNOSIS — S058X9A Other injuries of unspecified eye and orbit, initial encounter: Secondary | ICD-10-CM | POA: Diagnosis not present

## 2013-07-25 ENCOUNTER — Ambulatory Visit (INDEPENDENT_AMBULATORY_CARE_PROVIDER_SITE_OTHER): Payer: Medicare Other | Admitting: Cardiovascular Disease

## 2013-07-25 ENCOUNTER — Encounter: Payer: Self-pay | Admitting: Cardiovascular Disease

## 2013-07-25 VITALS — BP 105/75 | HR 72 | Ht 72.0 in | Wt 192.0 lb

## 2013-07-25 DIAGNOSIS — I4891 Unspecified atrial fibrillation: Secondary | ICD-10-CM

## 2013-07-25 DIAGNOSIS — I2510 Atherosclerotic heart disease of native coronary artery without angina pectoris: Secondary | ICD-10-CM | POA: Diagnosis not present

## 2013-07-25 DIAGNOSIS — R002 Palpitations: Secondary | ICD-10-CM | POA: Diagnosis not present

## 2013-07-25 DIAGNOSIS — I519 Heart disease, unspecified: Secondary | ICD-10-CM

## 2013-07-25 DIAGNOSIS — I5189 Other ill-defined heart diseases: Secondary | ICD-10-CM

## 2013-07-25 DIAGNOSIS — Z87898 Personal history of other specified conditions: Secondary | ICD-10-CM

## 2013-07-25 DIAGNOSIS — I319 Disease of pericardium, unspecified: Secondary | ICD-10-CM | POA: Diagnosis not present

## 2013-07-25 DIAGNOSIS — I480 Paroxysmal atrial fibrillation: Secondary | ICD-10-CM

## 2013-07-25 DIAGNOSIS — Z9289 Personal history of other medical treatment: Secondary | ICD-10-CM

## 2013-07-25 NOTE — Progress Notes (Signed)
Patient ID: Anthony Kelly, male   DOB: [birth year], 68 y.o.   MRN: [id number]      SUBJECTIVE: The patient is a 68 year old male who was hospitalized in late April of this year for acute pericarditis. He has a history of PSVT and coronary artery calcifications seen on a prior cardiac CT. He underwent a nuclear stress test during this hospitalization which was found to be normal. He had a transient episode of atrial fibrillation which spontaneously converted to sinus rhythm. As his CHADSVASC score was 1, he was not deemed appropriate for anticoagulation. CT angiography of the chest did not demonstrate any evidence of PE. Echocardiogram at that time demonstrated normal left ventricular systolic function, EF 51-02%, and normal wall motion and diastolic function. CRP was elevated but ESR was negative. Thyroid function was normal. There was some thought to placing him on a 30 day monitor to assess for silent paroxysmal atrial fibrillation, but he has a severe allergy to EKG pads including the sensitive types. 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 He was discharged on colchicine to be taken twice daily for 3 months as well as long-acting diltiazem.  A repeat echocardiogram on 06/18/2013 demonstrated normal left ventricular systolic function and regional wall motion with grade 2 diastolic dysfunction and mild biatrial enlargement. There was no pericardial effusion.  This past Wednesday evening, he experienced fluttering in his chest and skipped beats and his heart rate monitor at home showed 108 beats per minute. He denies chest pain, orthopnea, paroxysmal nocturnal dyspnea, dizziness, leg swelling and syncope. He took an extra dose of Toprol-XL and his symptoms resolved.  No Known Allergies  Current Outpatient Prescriptions  Medication Sig Dispense Refill  . acyclovir (ZOVIRAX) 400 MG tablet Take 1 tablet (400 mg total) by mouth daily.  90  tablet  1  . allopurinol (ZYLOPRIM) 300 MG tablet Take 1 tablet (300 mg total) by mouth daily.  30 tablet  0  . aspirin EC 81 MG tablet Take 81 mg by mouth every morning.      Marland Kitchen atorvastatin (LIPITOR) 10 MG tablet Take 5 mg by mouth at bedtime.       . colchicine 0.6 MG tablet Take 1 tablet (0.6 mg total) by mouth 2 (two) times daily. For 3 months.  180 tablet  1  . diltiazem (CARDIZEM CD) 120 MG 24 hr capsule Take 120 mg by mouth every morning.      . fluticasone (FLONASE) 50 MCG/ACT nasal spray Place 1 spray into both nostrils daily.      Marland Kitchen lidocaine-prilocaine (EMLA) cream Apply 1 application topically as needed.  30 g  0  . Menthol-Methyl Salicylate (MUSCLE RUB) 10-15 % CREA Apply 1 application topically as needed for muscle pain.      . metoprolol succinate (TOPROL-XL) 25 MG 24 hr tablet Take 25 mg by mouth every morning.      . Multiple Vitamin (MULTIVITAMIN WITH MINERALS) TABS tablet Take 1 tablet by mouth daily.      . ondansetron (ZOFRAN) 8 MG tablet Take 1 tablet (8 mg total) by mouth every 8 (eight) hours as needed for nausea.  30 tablet  3  . promethazine (PHENERGAN) 25 MG tablet Take 1 tablet (25 mg total) by mouth every 6 (six) hours as needed for nausea.  30 tablet  3  . trimethoprim-polymyxin b (POLYTRIM) ophthalmic solution 4 (four) times daily.      Marland Kitchen ZYRTEC ALLERGY 10 MG tablet TAKE 1  TABLET DAILY  90 tablet  0   No current facility-administered medications for this visit.    Past Medical History  Diagnosis Date  . PSVT (paroxysmal supraventricular tachycardia)   . Arthritis   . 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 Metastasis to lymph nodes dx'd 03/2011    lt submandibular ln  .  Atrial fibrillation     a. Isolated episode in the setting of acute pericarditis 04/2013. Was not placed on anticoag.  Marland Kitchen Acute pericarditis     a. 04/2013 -adm with CP, elevated CRP. H/o coronary artery calcification on prior CT but nuc was negative, EF 71%.    Past Surgical History  Procedure Laterality Date  . Lung lobectomy      right side  . Cholecystectomy    . Lymph node biopsy      in groin with removal  . Meniscus repair      right knee  . Vein ligation and stripping      right leg  . Tonsillectomy      as a child  . Submandibular gland excision  04/2011  . Submandibular gland excision  04/30/2011    Procedure: EXCISION SUBMANDIBULAR GLAND;  Surgeon: Jerrell Belfast, MD;  Location: [place];  Service: ENT;  Laterality: Left;  WITH DIAGNOSTIC BIOPSY  . Exploratory laparotomy      History   Social History  . Marital Status: Married    Spouse Name: N/A    Number of Children: N/A  . Years of Education: N/A   Occupational History  . Not on file.   Social History Main Topics  . Smoking status: Former Smoker -- 1.50 packs/day for 25 years    Quit date: 01/11/1990  . Smokeless tobacco: Never Used  . Alcohol Use: No  . Drug Use: No  . Sexual Activity: Not Currently   Other Topics Concern  . Not on file   Social History Narrative  . No narrative on file     Filed Vitals:   07/25/13 0814  Height: 6' (1.829 m)  Weight: 192 lb (87.091 kg)   BP 105/75  Pulse 72   PHYSICAL EXAM General: NAD Neck: No JVD, no thyromegaly. Lungs: Clear to auscultation bilaterally with normal respiratory effort. CV: Nondisplaced PMI.  Regular rate and rhythm, normal S1/S2, no S3/S4, no murmur. No pretibial or periankle edema.  No carotid bruit.  Normal pedal pulses.  Abdomen: Soft, nontender, no hepatosplenomegaly, no distention.  Neurologic: Alert and oriented x 3.  Psych: Normal affect. Extremities: No clubbing or cyanosis.   ECG: reviewed and available in electronic  records.      ASSESSMENT AND PLAN: 1. Pericarditis: Continue colchicine as directed. 2. PSVT/PAF: Has notable paroxysms, but unable to wear a monitor. Continue long-acting diltiazem and long-acting metoprolol. Given his low normal BP, I am not inclined to increase the dose of either. 3. Coronary artery calcifications on CT: Normal nuclear MPI study, thus no further testing is indicated at this time. 4. Grade II diastolic dysfunction: No evidence of heart failure.   Dispo: f/u 3 months.  Kate Sable, M.D., F.A.C.C.

## 2013-07-25 NOTE — Patient Instructions (Signed)
Continue all current medications. Follow up in  3 months 

## 2013-07-27 DIAGNOSIS — H179 Unspecified corneal scar and opacity: Secondary | ICD-10-CM | POA: Diagnosis not present

## 2013-08-01 ENCOUNTER — Ambulatory Visit (HOSPITAL_BASED_OUTPATIENT_CLINIC_OR_DEPARTMENT_OTHER): Payer: Medicare Other | Admitting: Hematology and Oncology

## 2013-08-01 ENCOUNTER — Ambulatory Visit: Payer: Medicare Other

## 2013-08-01 ENCOUNTER — Encounter: Payer: Self-pay | Admitting: Hematology and Oncology

## 2013-08-01 ENCOUNTER — Other Ambulatory Visit (HOSPITAL_BASED_OUTPATIENT_CLINIC_OR_DEPARTMENT_OTHER): Payer: Medicare Other

## 2013-08-01 ENCOUNTER — Telehealth: Payer: Self-pay | Admitting: Hematology and Oncology

## 2013-08-01 ENCOUNTER — Ambulatory Visit (HOSPITAL_BASED_OUTPATIENT_CLINIC_OR_DEPARTMENT_OTHER): Payer: Medicare Other

## 2013-08-01 VITALS — BP 125/74 | HR 68 | Temp 97.6°F | Resp 19 | Ht 72.0 in | Wt 194.3 lb

## 2013-08-01 VITALS — BP 97/58 | HR 63 | Temp 97.5°F | Resp 18

## 2013-08-01 DIAGNOSIS — M25559 Pain in unspecified hip: Secondary | ICD-10-CM | POA: Insufficient documentation

## 2013-08-01 DIAGNOSIS — C8589 Other specified types of non-Hodgkin lymphoma, extranodal and solid organ sites: Secondary | ICD-10-CM

## 2013-08-01 DIAGNOSIS — C8510 Unspecified B-cell lymphoma, unspecified site: Secondary | ICD-10-CM

## 2013-08-01 DIAGNOSIS — Z5111 Encounter for antineoplastic chemotherapy: Secondary | ICD-10-CM | POA: Diagnosis not present

## 2013-08-01 DIAGNOSIS — Z452 Encounter for adjustment and management of vascular access device: Secondary | ICD-10-CM | POA: Diagnosis not present

## 2013-08-01 DIAGNOSIS — I309 Acute pericarditis, unspecified: Secondary | ICD-10-CM | POA: Diagnosis not present

## 2013-08-01 DIAGNOSIS — Z95828 Presence of other vascular implants and grafts: Secondary | ICD-10-CM

## 2013-08-01 HISTORY — DX: Pain in unspecified hip: M25.559

## 2013-08-01 LAB — CBC WITH DIFFERENTIAL/PLATELET
BASO%: 1 % (ref 0.0–2.0)
Basophils Absolute: 0.1 10*3/uL (ref 0.0–0.1)
EOS%: 3.6 % (ref 0.0–7.0)
Eosinophils Absolute: 0.3 10*3/uL (ref 0.0–0.5)
HCT: 47.5 % (ref 38.4–49.9)
HGB: 15.6 g/dL (ref 13.0–17.1)
LYMPH%: 10.6 % — AB (ref 14.0–49.0)
MCH: 29.3 pg (ref 27.2–33.4)
MCHC: 32.9 g/dL (ref 32.0–36.0)
MCV: 89 fL (ref 79.3–98.0)
MONO#: 0.7 10*3/uL (ref 0.1–0.9)
MONO%: 9.6 % (ref 0.0–14.0)
NEUT#: 5.8 10*3/uL (ref 1.5–6.5)
NEUT%: 75.2 % — ABNORMAL HIGH (ref 39.0–75.0)
PLATELETS: 161 10*3/uL (ref 140–400)
RBC: 5.33 10*6/uL (ref 4.20–5.82)
RDW: 14.4 % (ref 11.0–14.6)
WBC: 7.8 10*3/uL (ref 4.0–10.3)
lymph#: 0.8 10*3/uL — ABNORMAL LOW (ref 0.9–3.3)

## 2013-08-01 LAB — COMPREHENSIVE METABOLIC PANEL (CC13)
ALBUMIN: 4 g/dL (ref 3.5–5.0)
ALK PHOS: 67 U/L (ref 40–150)
ALT: 26 U/L (ref 0–55)
ANION GAP: 11 meq/L (ref 3–11)
AST: 21 U/L (ref 5–34)
BUN: 13.4 mg/dL (ref 7.0–26.0)
CHLORIDE: 107 meq/L (ref 98–109)
CO2: 24 meq/L (ref 22–29)
Calcium: 9.5 mg/dL (ref 8.4–10.4)
Creatinine: 1 mg/dL (ref 0.7–1.3)
Glucose: 102 mg/dl (ref 70–140)
POTASSIUM: 4.5 meq/L (ref 3.5–5.1)
SODIUM: 142 meq/L (ref 136–145)
TOTAL PROTEIN: 6.7 g/dL (ref 6.4–8.3)
Total Bilirubin: 0.72 mg/dL (ref 0.20–1.20)

## 2013-08-01 MED ORDER — ACETAMINOPHEN 325 MG PO TABS
650.0000 mg | ORAL_TABLET | Freq: Once | ORAL | Status: AC
  Administered 2013-08-01: 650 mg via ORAL

## 2013-08-01 MED ORDER — ALTEPLASE 2 MG IJ SOLR
2.0000 mg | Freq: Once | INTRAMUSCULAR | Status: AC | PRN
  Administered 2013-08-01: 2 mg
  Filled 2013-08-01: qty 2

## 2013-08-01 MED ORDER — HEPARIN SOD (PORK) LOCK FLUSH 100 UNIT/ML IV SOLN
500.0000 [IU] | Freq: Once | INTRAVENOUS | Status: AC | PRN
Start: 2013-08-01 — End: 2013-08-01
  Administered 2013-08-01: 500 [IU]
  Filled 2013-08-01: qty 5

## 2013-08-01 MED ORDER — SODIUM CHLORIDE 0.9 % IJ SOLN
10.0000 mL | INTRAMUSCULAR | Status: DC | PRN
  Administered 2013-08-01: 10 mL via INTRAVENOUS
  Filled 2013-08-01: qty 10

## 2013-08-01 MED ORDER — ONDANSETRON 8 MG/50ML IVPB (CHCC)
8.0000 mg | Freq: Once | INTRAVENOUS | Status: AC
  Administered 2013-08-01: 8 mg via INTRAVENOUS

## 2013-08-01 MED ORDER — DEXAMETHASONE SODIUM PHOSPHATE 10 MG/ML IJ SOLN
INTRAMUSCULAR | Status: AC
Start: 2013-08-01 — End: 2013-08-01
  Filled 2013-08-01: qty 1

## 2013-08-01 MED ORDER — SODIUM CHLORIDE 0.9 % IV SOLN
90.0000 mg/m2 | Freq: Once | INTRAVENOUS | Status: AC
  Administered 2013-08-01: 189 mg via INTRAVENOUS
  Filled 2013-08-01: qty 2.1

## 2013-08-01 MED ORDER — SODIUM CHLORIDE 0.9 % IV SOLN
375.0000 mg/m2 | Freq: Once | INTRAVENOUS | Status: AC
  Administered 2013-08-01: 800 mg via INTRAVENOUS
  Filled 2013-08-01: qty 80

## 2013-08-01 MED ORDER — SODIUM CHLORIDE 0.9 % IJ SOLN
10.0000 mL | INTRAMUSCULAR | Status: DC | PRN
  Administered 2013-08-01: 10 mL
  Filled 2013-08-01: qty 10

## 2013-08-01 MED ORDER — ACETAMINOPHEN 325 MG PO TABS
ORAL_TABLET | ORAL | Status: AC
  Filled 2013-08-01: qty 2

## 2013-08-01 MED ORDER — ONDANSETRON 8 MG/NS 50 ML IVPB
INTRAVENOUS | Status: AC
Start: 2013-08-01 — End: 2013-08-01
  Filled 2013-08-01: qty 8

## 2013-08-01 MED ORDER — DEXAMETHASONE SODIUM PHOSPHATE 10 MG/ML IJ SOLN
10.0000 mg | Freq: Once | INTRAMUSCULAR | Status: AC
  Administered 2013-08-01: 10 mg via INTRAVENOUS

## 2013-08-01 MED ORDER — DIPHENHYDRAMINE HCL 25 MG PO CAPS
50.0000 mg | ORAL_CAPSULE | Freq: Once | ORAL | Status: AC
  Administered 2013-08-01: 50 mg via ORAL

## 2013-08-01 MED ORDER — IBUPROFEN 800 MG PO TABS: 800.0000 mg | ORAL_TABLET | Freq: Three times a day (TID) | ORAL | Status: DC | PRN

## 2013-08-01 MED ORDER — SODIUM CHLORIDE 0.9 % IV SOLN
Freq: Once | INTRAVENOUS | Status: AC
  Administered 2013-08-01: 11:00:00 via INTRAVENOUS

## 2013-08-01 MED ORDER — DIPHENHYDRAMINE HCL 25 MG PO CAPS
ORAL_CAPSULE | ORAL | Status: AC
  Filled 2013-08-01: qty 2

## 2013-08-01 NOTE — Progress Notes (Signed)
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   12/12/2003 - 06/01/2004 Chemotherapy He was treated with R. CHOP chemotherapy which show complete remission. The number of cycles of R. CHOP chemotherapy was unknown.   12/19/2006 Surgery Lung resection show follicular lymphoma.   01/02/2007 - 09/01/2008 Chemotherapy The patient was treated with single agent rituximab alone.   01/19/2007 Bone Marrow Biopsy Bone marrow biopsy was negative.   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   05/11/2011 Initial Diagnosis Low grade B-cell lymphoma   05/08/2013 - 05/11/2013 Hospital Admission The patient was admitted to the hospital for management of pericarditis. CT scan showed extensive lymphadenopathy.   06/07/2013 Imaging PET/CT scan showed extensive lymphadenopathy   06/25/2013 Procedure He has placement of Infuse-a-Port.   06/28/2013 Bone Marrow Biopsy Bone marrow biopsy is positive for lymphoma involvement.   07/04/2013 -  Chemotherapy He is treated with cycle 1 of bendamustine with rituximab.    INTERVAL HISTORY: Please see below for problem oriented charting. He is seen prior to cycle 2 of treatment. With cycle 1, he had mild infusion reaction with rituximab, resolved with conservative management. He also complained of some mild bone pain after Neulasta injection, resolved with ibuprofen. He denies any chest pain, shortness of breath or recurrence of pericarditis.  REVIEW OF SYSTEMS:   Constitutional: Denies fevers, chills or abnormal weight loss Eyes: Denies blurriness of vision Ears, nose, mouth, throat, and face: Denies mucositis or sore throat Respiratory: Denies cough, dyspnea or  wheezes Cardiovascular: Denies palpitation, chest discomfort or lower extremity swelling Gastrointestinal:  Denies nausea, heartburn or change in bowel habits Skin: Denies abnormal skin rashes Lymphatics: Denies new lymphadenopathy or easy bruising Neurological:Denies numbness, tingling or new weaknesses Behavioral/Psych: Mood is stable, no new changes  All other systems were reviewed with the patient and are negative.  I have reviewed the past medical history, past surgical history, social history and family history with the patient and they are unchanged from previous note.  ALLERGIES:  has No Known Allergies.  MEDICATIONS:  Current Outpatient Prescriptions  Medication Sig Dispense Refill  . acyclovir (ZOVIRAX) 400 MG tablet Take 1 tablet (400 mg total) by mouth daily.  90 tablet  1  . aspirin EC 81 MG tablet Take 81 mg by mouth every morning.      Marland Kitchen atorvastatin (LIPITOR) 10 MG tablet Take 5 mg by mouth at bedtime.       . colchicine 0.6 MG tablet Take 0.6 mg by mouth daily.      Marland Kitchen diltiazem (CARDIZEM CD) 120 MG 24 hr capsule Take 120 mg by mouth every morning.      Marland Kitchen ibuprofen (ADVIL,MOTRIN) 800 MG tablet Take 800 mg by mouth every 8 (eight) hours as needed.      . lidocaine-prilocaine (EMLA) cream Apply 1 application topically as needed.  30 g  0  . Menthol-Methyl Salicylate (MUSCLE RUB) 10-15 % CREA Apply 1 application topically as needed for muscle pain.      . metoprolol succinate (TOPROL-XL) 25 MG 24 hr tablet Take 25 mg by mouth every morning.      . Multiple Vitamin (MULTIVITAMIN WITH MINERALS) TABS tablet Take 1 tablet by mouth daily.      . ondansetron (  ZOFRAN) 8 MG tablet Take 1 tablet (8 mg total) by mouth every 8 (eight) hours as needed for nausea.  30 tablet  3  . promethazine (PHENERGAN) 25 MG tablet Take 1 tablet (25 mg total) by mouth every 6 (six) hours as needed for nausea.  30 tablet  3  . Tetrahydrozoline HCl (EYE DROPS OP) Apply to eye. Zylet 4 times daily      .  Tetrahydrozoline HCl (EYE DROPS OP) Apply to eye. Lubricant 4 times a day      . ibuprofen (ADVIL,MOTRIN) 800 MG tablet Take 1 tablet (800 mg total) by mouth every 8 (eight) hours as needed.  30 tablet  0   No current facility-administered medications for this visit.   Facility-Administered Medications Ordered in Other Visits  Medication Dose Route Frequency Provider Last Rate Last Dose  . bendamustine (TREANDA) 189 mg in sodium chloride 0.9 % 500 mL chemo infusion  90 mg/m2 (Treatment Plan Actual) Intravenous Once [name], MD      . dexamethasone (DECADRON) injection 10 mg  10 mg Intravenous Once [name], MD      . heparin lock flush 100 unit/mL  500 Units Intracatheter Once PRN [name], MD      . ondansetron (ZOFRAN) IVPB 8 mg  8 mg Intravenous Once [name], MD      . sodium chloride 0.9 % injection 10 mL  10 mL Intracatheter PRN [name], MD        PHYSICAL EXAMINATION: ECOG PERFORMANCE STATUS: 0 - Asymptomatic  Filed Vitals:   08/01/13 0940  BP: 125/74  Pulse: 68  Temp: 97.6 F (36.4 C)  Resp: 19   Filed Weights   08/01/13 0940  Weight: 194 lb 4.8 oz (88.134 kg)    GENERAL:alert, no distress and comfortable SKIN: skin color, texture, turgor are normal, no rashes or significant lesions EYES: normal, Conjunctiva are pink and non-injected, sclera clear OROPHARYNX:no exudate, no erythema and lips, buccal mucosa, and tongue normal  NECK: supple, thyroid normal size, non-tender, without nodularity LYMPH:  no palpable lymphadenopathy in the cervical, axillary or inguinal LUNGS: clear to auscultation and percussion with normal breathing effort HEART: regular rate & rhythm and no murmurs and no lower extremity edema ABDOMEN:abdomen soft, non-tender and normal bowel sounds Musculoskeletal:no cyanosis of digits and no clubbing  NEURO: alert & oriented x 3 with fluent speech, no focal motor/sensory deficits  LABORATORY DATA:  I have reviewed the data as listed     Component Value Date/Time   NA 142 08/01/2013 0849   NA 140 06/28/2013 0705   NA 139 07/20/2011 0800   K 4.5 08/01/2013 0849   K 3.9 06/28/2013 0705   K 4.4 07/20/2011 0800   CL 103 06/28/2013 0705   CL 104 03/24/2012 1024   CL 100 07/20/2011 0800   CO2 24 08/01/2013 0849   CO2 24 06/28/2013 0705   CO2 29 07/20/2011 0800   GLUCOSE 102 08/01/2013 0849   GLUCOSE 111* 06/28/2013 0705   GLUCOSE 80 03/24/2012 1024   GLUCOSE 100 07/20/2011 0800   BUN 13.4 08/01/2013 0849   BUN 19 06/28/2013 0705   BUN 14 07/20/2011 0800   CREATININE 1.0 08/01/2013 0849   CREATININE 1.06 06/28/2013 0705   CREATININE 1.2 07/20/2011 0800   CALCIUM 9.5 08/01/2013 0849   CALCIUM 9.0 06/28/2013 0705   CALCIUM 8.7 07/20/2011 0800   PROT 6.7 08/01/2013 0849   PROT 6.5 06/28/2013 0705   PROT 7.2 07/20/2011 0800  ALBUMIN 4.0 08/01/2013 0849   ALBUMIN 3.8 06/28/2013 0705   AST 21 08/01/2013 0849   AST 23 06/28/2013 0705   AST 25 07/20/2011 0800   ALT 26 08/01/2013 0849   ALT 26 06/28/2013 0705   ALT 29 07/20/2011 0800   ALKPHOS 67 08/01/2013 0849   ALKPHOS 64 06/28/2013 0705   ALKPHOS 55 07/20/2011 0800   BILITOT 0.72 08/01/2013 0849   BILITOT 0.6 06/28/2013 0705   BILITOT 1.00 07/20/2011 0800   GFRNONAA 70* 06/28/2013 0705   GFRAA 81* 06/28/2013 0705    No results found for this basename: SPEP, UPEP,  kappa and lambda light chains    Lab Results  Component Value Date   WBC 7.8 08/01/2013   NEUTROABS 5.8 08/01/2013   HGB 15.6 08/01/2013   HCT 47.5 08/01/2013   MCV 89.0 08/01/2013   PLT 161 08/01/2013      Chemistry      Component Value Date/Time   NA 142 08/01/2013 0849   NA 140 06/28/2013 0705   NA 139 07/20/2011 0800   K 4.5 08/01/2013 0849   K 3.9 06/28/2013 0705   K 4.4 07/20/2011 0800   CL 103 06/28/2013 0705   CL 104 03/24/2012 1024   CL 100 07/20/2011 0800   CO2 24 08/01/2013 0849   CO2 24 06/28/2013 0705   CO2 29 07/20/2011 0800   BUN 13.4 08/01/2013 0849   BUN 19 06/28/2013 0705   BUN 14 07/20/2011 0800   CREATININE 1.0 08/01/2013 0849    CREATININE 1.06 06/28/2013 0705   CREATININE 1.2 07/20/2011 0800      Component Value Date/Time   CALCIUM 9.5 08/01/2013 0849   CALCIUM 9.0 06/28/2013 0705   CALCIUM 8.7 07/20/2011 0800   ALKPHOS 67 08/01/2013 0849   ALKPHOS 64 06/28/2013 0705   ALKPHOS 55 07/20/2011 0800   AST 21 08/01/2013 0849   AST 23 06/28/2013 0705   AST 25 07/20/2011 0800   ALT 26 08/01/2013 0849   ALT 26 06/28/2013 0705   ALT 29 07/20/2011 0800   BILITOT 0.72 08/01/2013 0849   BILITOT 0.6 06/28/2013 0705   BILITOT 1.00 07/20/2011 0800      ASSESSMENT & PLAN:  Low grade B-cell lymphoma He tolerated cycle 1 of treatment well apart from mild infusion reaction with rituximab. We'll proceed with cycle 2 of treatment without dosage adjustment.  Pain in joint, pelvic region and thigh This is related to Neulasta injection. I will prescribe ibuprofen as needed and recommend he takes ibuprofen with food to reduce risk of gastritis.  Acute pericarditis- ST elavation on EKG This has resolved. He will continue to taper off medication with colchicine under the guidance of his cardiologist.   No orders of the defined types were placed in this encounter.   All questions were answered. The patient knows to call the clinic with any problems, questions or concerns. No barriers to learning was detected. I spent 25 minutes counseling the patient face to face. The total time spent in the appointment was 30 minutes and more than 50% was on counseling and review of test results     [facility], [place], MD 08/01/2013 1:50 PM

## 2013-08-01 NOTE — Patient Instructions (Signed)
[place] [place] Discharge Instructions for Patients Receiving Chemotherapy  Today you received the following chemotherapy agents Rituxan/Treanda.  To help prevent nausea and vomiting after your treatment, we encourage you to take your nausea medication as prescribed.   If you develop nausea and vomiting that is not controlled by your nausea medication, call the clinic.   BELOW ARE SYMPTOMS THAT SHOULD BE REPORTED IMMEDIATELY:  *FEVER GREATER THAN 100.5 F  *CHILLS WITH OR WITHOUT FEVER  NAUSEA AND VOMITING THAT IS NOT CONTROLLED WITH YOUR NAUSEA MEDICATION  *UNUSUAL SHORTNESS OF BREATH  *UNUSUAL BRUISING OR BLEEDING  TENDERNESS IN MOUTH AND THROAT WITH OR WITHOUT PRESENCE OF ULCERS  *URINARY PROBLEMS  *BOWEL PROBLEMS  UNUSUAL RASH Items with * indicate a potential emergency and should be followed up as soon as possible.  Feel free to call the clinic you have any questions or concerns. The clinic phone number is [phone].

## 2013-08-01 NOTE — Telephone Encounter (Signed)
Pt confirmed labs/ov per 07/22 POF, sent msg to [facility] to add chemo and gave pt AVS....KJ

## 2013-08-01 NOTE — Assessment & Plan Note (Signed)
This has resolved. He will continue to taper off medication with colchicine under the guidance of his cardiologist.

## 2013-08-01 NOTE — Assessment & Plan Note (Signed)
He tolerated cycle 1 of treatment well apart from mild infusion reaction with rituximab. We'll proceed with cycle 2 of treatment without dosage adjustment.

## 2013-08-01 NOTE — Assessment & Plan Note (Signed)
This is related to Neulasta injection. I will prescribe ibuprofen as needed and recommend he takes ibuprofen with food to reduce risk of gastritis.

## 2013-08-02 ENCOUNTER — Ambulatory Visit (HOSPITAL_BASED_OUTPATIENT_CLINIC_OR_DEPARTMENT_OTHER): Payer: Medicare Other

## 2013-08-02 VITALS — BP 114/70 | HR 95 | Temp 98.0°F | Resp 18

## 2013-08-02 DIAGNOSIS — C8589 Other specified types of non-Hodgkin lymphoma, extranodal and solid organ sites: Secondary | ICD-10-CM

## 2013-08-02 DIAGNOSIS — Z5111 Encounter for antineoplastic chemotherapy: Secondary | ICD-10-CM | POA: Diagnosis not present

## 2013-08-02 DIAGNOSIS — C8510 Unspecified B-cell lymphoma, unspecified site: Secondary | ICD-10-CM

## 2013-08-02 MED ORDER — DEXAMETHASONE SODIUM PHOSPHATE 10 MG/ML IJ SOLN
INTRAMUSCULAR | Status: AC
  Filled 2013-08-02: qty 1

## 2013-08-02 MED ORDER — DEXAMETHASONE SODIUM PHOSPHATE 10 MG/ML IJ SOLN
10.0000 mg | Freq: Once | INTRAMUSCULAR | Status: AC
  Administered 2013-08-02: 10 mg via INTRAVENOUS

## 2013-08-02 MED ORDER — SODIUM CHLORIDE 0.9 % IV SOLN
90.0000 mg/m2 | Freq: Once | INTRAVENOUS | Status: AC
  Administered 2013-08-02: 189 mg via INTRAVENOUS
  Filled 2013-08-02: qty 2.1

## 2013-08-02 MED ORDER — HEPARIN SOD (PORK) LOCK FLUSH 100 UNIT/ML IV SOLN
500.0000 [IU] | Freq: Once | INTRAVENOUS | Status: AC | PRN
Start: 2013-08-02 — End: 2013-08-02
  Administered 2013-08-02: 500 [IU]
  Filled 2013-08-02: qty 5

## 2013-08-02 MED ORDER — SODIUM CHLORIDE 0.9 % IJ SOLN
10.0000 mL | INTRAMUSCULAR | Status: DC | PRN
  Administered 2013-08-02: 10 mL
  Filled 2013-08-02: qty 10

## 2013-08-02 MED ORDER — ONDANSETRON 8 MG/NS 50 ML IVPB
INTRAVENOUS | Status: AC
  Filled 2013-08-02: qty 8

## 2013-08-02 MED ORDER — ONDANSETRON 8 MG/50ML IVPB (CHCC)
8.0000 mg | Freq: Once | INTRAVENOUS | Status: AC
  Administered 2013-08-02: 8 mg via INTRAVENOUS

## 2013-08-02 MED ORDER — SODIUM CHLORIDE 0.9 % IV SOLN
Freq: Once | INTRAVENOUS | Status: AC
  Administered 2013-08-02: 09:00:00 via INTRAVENOUS

## 2013-08-02 NOTE — Patient Instructions (Signed)
[facility] Discharge Instructions for Patients Receiving Chemotherapy  Today you received the following chemotherapy agents Treanda.  To help prevent nausea and vomiting after your treatment, we encourage you to take your nausea medication.   If you develop nausea and vomiting that is not controlled by your nausea medication, call the clinic.   BELOW ARE SYMPTOMS THAT SHOULD BE REPORTED IMMEDIATELY:  *FEVER GREATER THAN 100.5 F  *CHILLS WITH OR WITHOUT FEVER  NAUSEA AND VOMITING THAT IS NOT CONTROLLED WITH YOUR NAUSEA MEDICATION  *UNUSUAL SHORTNESS OF BREATH  *UNUSUAL BRUISING OR BLEEDING  TENDERNESS IN MOUTH AND THROAT WITH OR WITHOUT PRESENCE OF ULCERS  *URINARY PROBLEMS  *BOWEL PROBLEMS  UNUSUAL RASH Items with * indicate a potential emergency and should be followed up as soon as possible.  Feel free to call the clinic you have any questions or concerns. The clinic phone number is [phone].

## 2013-08-03 ENCOUNTER — Ambulatory Visit (HOSPITAL_BASED_OUTPATIENT_CLINIC_OR_DEPARTMENT_OTHER): Payer: Medicare Other

## 2013-08-03 VITALS — BP 113/66 | HR 65

## 2013-08-03 DIAGNOSIS — Z5189 Encounter for other specified aftercare: Secondary | ICD-10-CM | POA: Diagnosis not present

## 2013-08-03 DIAGNOSIS — C8589 Other specified types of non-Hodgkin lymphoma, extranodal and solid organ sites: Secondary | ICD-10-CM | POA: Diagnosis not present

## 2013-08-03 DIAGNOSIS — C8510 Unspecified B-cell lymphoma, unspecified site: Secondary | ICD-10-CM

## 2013-08-03 MED ORDER — PEGFILGRASTIM INJECTION 6 MG/0.6ML
6.0000 mg | Freq: Once | SUBCUTANEOUS | Status: AC
  Administered 2013-08-03: 6 mg via SUBCUTANEOUS
  Filled 2013-08-03: qty 0.6

## 2013-08-03 NOTE — Patient Instructions (Signed)
Pegfilgrastim injection What is this medicine? PEGFILGRASTIM (peg fil GRA stim) is a long-acting granulocyte colony-stimulating factor that stimulates the growth of neutrophils, a type of white blood cell important in the body's fight against infection. It is used to reduce the incidence of fever and infection in patients with certain types of cancer who are receiving chemotherapy that affects the bone marrow. This medicine may be used for other purposes; ask your health care provider or pharmacist if you have questions. COMMON BRAND NAME(S): Neulasta What should I tell my health care provider before I take this medicine? They need to know if you have any of these conditions: -latex allergy -ongoing radiation therapy -sickle cell disease -skin reactions to acrylic adhesives (On-Body Injector only) -an unusual or allergic reaction to pegfilgrastim, filgrastim, other medicines, foods, dyes, or preservatives -pregnant or trying to get pregnant -breast-feeding How should I use this medicine? This medicine is for injection under the skin. If you get this medicine at home, you will be taught how to prepare and give the pre-filled syringe or how to use the On-body Injector. Refer to the patient Instructions for Use for detailed instructions. Use exactly as directed. Take your medicine at regular intervals. Do not take your medicine more often than directed. It is important that you put your used needles and syringes in a special sharps container. Do not put them in a trash can. If you do not have a sharps container, call your pharmacist or healthcare provider to get one. Talk to your pediatrician regarding the use of this medicine in children. Special care may be needed. Overdosage: If you think you have taken too much of this medicine contact a poison control center or emergency room at once. NOTE: This medicine is only for you. Do not share this medicine with others. What if I miss a dose? It is  important not to miss your dose. Call your doctor or health care professional if you miss your dose. If you miss a dose due to an On-body Injector failure or leakage, a new dose should be administered as soon as possible using a single prefilled syringe for manual use. What may interact with this medicine? Interactions have not been studied. Give your health care provider a list of all the medicines, herbs, non-prescription drugs, or dietary supplements you use. Also tell them if you smoke, drink alcohol, or use illegal drugs. Some items may interact with your medicine. This list may not describe all possible interactions. Give your health care provider a list of all the medicines, herbs, non-prescription drugs, or dietary supplements you use. Also tell them if you smoke, drink alcohol, or use illegal drugs. Some items may interact with your medicine. What should I watch for while using this medicine? You may need blood work done while you are taking this medicine. If you are going to need a MRI, CT scan, or other procedure, tell your doctor that you are using this medicine (On-Body Injector only). What side effects may I notice from receiving this medicine? Side effects that you should report to your doctor or health care professional as soon as possible: -allergic reactions like skin rash, itching or hives, swelling of the face, lips, or tongue -dizziness -fever -pain, redness, or irritation at site where injected -pinpoint red spots on the skin -shortness of breath or breathing problems -stomach or side pain, or pain at the shoulder -swelling -tiredness -trouble passing urine Side effects that usually do not require medical attention (report to your doctor   or health care professional if they continue or are bothersome): -bone pain -muscle pain This list may not describe all possible side effects. Call your doctor for medical advice about side effects. You may report side effects to FDA at  1-800-FDA-1088. Where should I keep my medicine? Keep out of the reach of children. Store pre-filled syringes in a refrigerator between 2 and 8 degrees C (36 and 46 degrees F). Do not freeze. Keep in carton to protect from light. Throw away this medicine if it is left out of the refrigerator for more than 48 hours. Throw away any unused medicine after the expiration date. NOTE: This sheet is a summary. It may not cover all possible information. If you have questions about this medicine, talk to your doctor, pharmacist, or health care provider.  2015, [employer]. (2013-03-29 16:14:05)  

## 2013-08-08 ENCOUNTER — Telehealth: Payer: Self-pay | Admitting: *Deleted

## 2013-08-08 NOTE — Telephone Encounter (Signed)
Per [facility] staff message scheduled appts. Advised scheduler 

## 2013-08-08 NOTE — Telephone Encounter (Signed)
Wife called about pt's chemo schedule for August.  It has not been scheduled yet.  Sent message to scheduler and notified wife that Dr. Alvy Bimler did order chemo to be scheduled again on 8/19 and Scheduler probably has not had a chance to schedule it yet.  Informed wife I sent them another message today.  She verbalized understanding.

## 2013-08-14 ENCOUNTER — Telehealth: Payer: Self-pay | Admitting: *Deleted

## 2013-08-14 ENCOUNTER — Encounter: Payer: Medicare Other | Admitting: Adult Health

## 2013-08-14 NOTE — Telephone Encounter (Signed)
Does he need lab work checked here or at Cardiologist? His next appt is 8/19.

## 2013-08-14 NOTE — Progress Notes (Signed)
    NO Show

## 2013-08-14 NOTE — Telephone Encounter (Signed)
S/w wife.  She states pt did not end up seeing Cardiologist or NP because pt's BP and Heart rate "straightened out."  States BP up to 116/75 and HR 75.  Wife states same thing happen about 9 to 10 days after his last chemo and now same thing happened this time about the same number of days later.  Encouraged wife to discuss w/ Dr. Alvy Bimler on next appt 8/19.  Call 911 if pt ever develops sob or chest pain w/ the elevated HR and continue to f/u w/ Cardiologist. She verbalized understanding.

## 2013-08-14 NOTE — Telephone Encounter (Signed)
Not the neulasta but chemo can cause BP problem due to changes in electrolytes, blood counts, etc

## 2013-08-14 NOTE — Telephone Encounter (Signed)
Wife reports pt having irregular Heart rate and low blood pressure. He has appt to see Nurse Practitioner at Cardiologist office this afternoon.  She asks if this could be treatment related from either the chemo or neulasta?  Last tx on 7/22, 7/23 with Neulasta on 7/24.

## 2013-08-14 NOTE — Telephone Encounter (Signed)
Not really, his last blood work was [employer]

## 2013-08-15 ENCOUNTER — Encounter: Payer: Self-pay | Admitting: *Deleted

## 2013-08-16 ENCOUNTER — Ambulatory Visit (HOSPITAL_BASED_OUTPATIENT_CLINIC_OR_DEPARTMENT_OTHER): Payer: Medicare Other | Admitting: Hematology and Oncology

## 2013-08-16 ENCOUNTER — Other Ambulatory Visit (HOSPITAL_BASED_OUTPATIENT_CLINIC_OR_DEPARTMENT_OTHER): Payer: Medicare Other

## 2013-08-16 ENCOUNTER — Ambulatory Visit (HOSPITAL_BASED_OUTPATIENT_CLINIC_OR_DEPARTMENT_OTHER): Payer: Medicare Other

## 2013-08-16 ENCOUNTER — Encounter: Payer: Self-pay | Admitting: Hematology and Oncology

## 2013-08-16 ENCOUNTER — Telehealth: Payer: Self-pay | Admitting: *Deleted

## 2013-08-16 VITALS — BP 128/80 | HR 83 | Temp 97.8°F | Resp 19 | Ht 72.0 in | Wt 190.5 lb

## 2013-08-16 DIAGNOSIS — K5732 Diverticulitis of large intestine without perforation or abscess without bleeding: Secondary | ICD-10-CM | POA: Diagnosis not present

## 2013-08-16 DIAGNOSIS — C8589 Other specified types of non-Hodgkin lymphoma, extranodal and solid organ sites: Secondary | ICD-10-CM

## 2013-08-16 DIAGNOSIS — C8510 Unspecified B-cell lymphoma, unspecified site: Secondary | ICD-10-CM

## 2013-08-16 DIAGNOSIS — Z95828 Presence of other vascular implants and grafts: Secondary | ICD-10-CM

## 2013-08-16 DIAGNOSIS — K5792 Diverticulitis of intestine, part unspecified, without perforation or abscess without bleeding: Secondary | ICD-10-CM

## 2013-08-16 HISTORY — DX: Diverticulitis of intestine, part unspecified, without perforation or abscess without bleeding: K57.92

## 2013-08-16 LAB — CBC WITH DIFFERENTIAL/PLATELET
BASO%: 0.8 % (ref 0.0–2.0)
BASOS ABS: 0.1 10*3/uL (ref 0.0–0.1)
EOS%: 3.7 % (ref 0.0–7.0)
Eosinophils Absolute: 0.3 10*3/uL (ref 0.0–0.5)
HCT: 42.8 % (ref 38.4–49.9)
HEMOGLOBIN: 14.7 g/dL (ref 13.0–17.1)
LYMPH%: 5.6 % — ABNORMAL LOW (ref 14.0–49.0)
MCH: 30 pg (ref 27.2–33.4)
MCHC: 34.3 g/dL (ref 32.0–36.0)
MCV: 87.3 fL (ref 79.3–98.0)
MONO#: 0.8 10*3/uL (ref 0.1–0.9)
MONO%: 9.6 % (ref 0.0–14.0)
NEUT#: 6.3 10*3/uL (ref 1.5–6.5)
NEUT%: 80.3 % — AB (ref 39.0–75.0)
Platelets: 163 10*3/uL (ref 140–400)
RBC: 4.9 10*6/uL (ref 4.20–5.82)
RDW: 14.5 % (ref 11.0–14.6)
WBC: 7.9 10*3/uL (ref 4.0–10.3)
lymph#: 0.4 10*3/uL — ABNORMAL LOW (ref 0.9–3.3)

## 2013-08-16 LAB — COMPREHENSIVE METABOLIC PANEL (CC13)
ALT: 23 U/L (ref 0–55)
AST: 16 U/L (ref 5–34)
Albumin: 3.5 g/dL (ref 3.5–5.0)
Alkaline Phosphatase: 77 U/L (ref 40–150)
Anion Gap: 9 mEq/L (ref 3–11)
BILIRUBIN TOTAL: 1.07 mg/dL (ref 0.20–1.20)
BUN: 15.7 mg/dL (ref 7.0–26.0)
CHLORIDE: 106 meq/L (ref 98–109)
CO2: 26 mEq/L (ref 22–29)
CREATININE: 1 mg/dL (ref 0.7–1.3)
Calcium: 9.3 mg/dL (ref 8.4–10.4)
Glucose: 160 mg/dl — ABNORMAL HIGH (ref 70–140)
Potassium: 4.2 mEq/L (ref 3.5–5.1)
Sodium: 141 mEq/L (ref 136–145)
Total Protein: 6.4 g/dL (ref 6.4–8.3)

## 2013-08-16 MED ORDER — SODIUM CHLORIDE 0.9 % IJ SOLN
10.0000 mL | INTRAMUSCULAR | Status: DC | PRN
  Administered 2013-08-16: 10 mL via INTRAVENOUS
  Filled 2013-08-16: qty 10

## 2013-08-16 MED ORDER — METRONIDAZOLE 500 MG PO TABS: 500.0000 mg | ORAL_TABLET | Freq: Three times a day (TID) | ORAL | Status: DC

## 2013-08-16 MED ORDER — CIPROFLOXACIN HCL 500 MG PO TABS: 500.0000 mg | ORAL_TABLET | Freq: Two times a day (BID) | ORAL | Status: DC

## 2013-08-16 MED ORDER — HEPARIN SOD (PORK) LOCK FLUSH 100 UNIT/ML IV SOLN
500.0000 [IU] | Freq: Once | INTRAVENOUS | Status: AC
  Administered 2013-08-16: 500 [IU] via INTRAVENOUS
  Filled 2013-08-16: qty 5

## 2013-08-16 NOTE — Telephone Encounter (Signed)
Wife states she can bring pt at 10:45 am for labs and requests labs drawn from [facility].  Order sent to scheduler and notified [facility] in Scheduling.

## 2013-08-16 NOTE — Telephone Encounter (Signed)
Can he come at 1115 am? I need labs

## 2013-08-16 NOTE — Progress Notes (Signed)
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   12/12/2003 - 06/01/2004 Chemotherapy He was treated with R. CHOP chemotherapy which show complete remission. The number of cycles of R. CHOP chemotherapy was unknown.   12/19/2006 Surgery Lung resection show follicular lymphoma.   01/02/2007 - 09/01/2008 Chemotherapy The patient was treated with single agent rituximab alone.   01/19/2007 Bone Marrow Biopsy Bone marrow biopsy was negative.   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   05/11/2011 Initial Diagnosis Low grade B-cell lymphoma   05/08/2013 - 05/11/2013 Hospital Admission The patient was admitted to the hospital for management of pericarditis. CT scan showed extensive lymphadenopathy.   06/07/2013 Imaging PET/CT scan showed extensive lymphadenopathy   06/25/2013 Procedure He has placement of Infuse-a-Port.   06/28/2013 Bone Marrow Biopsy Bone marrow biopsy is positive for lymphoma involvement.   07/04/2013 -  Chemotherapy He is treated with cycle 1 of bendamustine with rituximab.    INTERVAL HISTORY: Please see below for problem oriented charting. He is seen today for urgent evaluation of severe left abdominal pain. The patient completed his most recent cycle 2 of treatment well. Approximately 3-4 days ago, he started to have lower abdominal cramping with loose bowel movements. He denies passage of blood in her stools. Recently, his pain has moved to the left upper lobe quadrant/flank area which is described as sharp pain. He has lost appetite and weight. He denies nausea, vomiting, fevers or chills.  REVIEW OF SYSTEMS:   Eyes: Denies blurriness of vision Ears, nose, mouth, throat, and face:  Denies mucositis or sore throat Respiratory: Denies cough, dyspnea or wheezes Cardiovascular: Denies palpitation, chest discomfort or lower extremity swelling Skin: Denies abnormal skin rashes Lymphatics: Denies new lymphadenopathy or easy bruising Neurological:Denies numbness, tingling or new weaknesses Behavioral/Psych: Mood is stable, no new changes  All other systems were reviewed with the patient and are negative.  I have reviewed the past medical history, past surgical history, social history and family history with the patient and they are unchanged from previous note.  ALLERGIES:  has No Known Allergies.  MEDICATIONS:  Current Outpatient Prescriptions  Medication Sig Dispense Refill  . acyclovir (ZOVIRAX) 400 MG tablet Take 1 tablet (400 mg total) by mouth daily.  90 tablet  1  . aspirin EC 81 MG tablet Take 81 mg by mouth every morning.      Marland Kitchen atorvastatin (LIPITOR) 10 MG tablet Take 5 mg by mouth at bedtime.       . ciprofloxacin (CIPRO) 500 MG tablet Take 1 tablet (500 mg total) by mouth 2 (two) times daily.  14 tablet  0  . colchicine 0.6 MG tablet Take 0.6 mg by mouth daily.      Marland Kitchen diltiazem (CARDIZEM CD) 120 MG 24 hr capsule Take 120 mg by mouth every morning.      Marland Kitchen ibuprofen (ADVIL,MOTRIN) 800 MG tablet Take 800 mg by mouth every 8 (eight) hours as needed.      Marland Kitchen ibuprofen (ADVIL,MOTRIN) 800 MG tablet Take 1 tablet (800 mg total) by mouth every 8 (eight) hours as needed.  30 tablet  0  . lidocaine-prilocaine (EMLA) cream Apply 1 application topically as needed.  30 g  0  . Menthol-Methyl Salicylate (MUSCLE  RUB) 10-15 % CREA Apply 1 application topically as needed for muscle pain.      . metoprolol succinate (TOPROL-XL) 25 MG 24 hr tablet Take 25 mg by mouth every morning.      . metroNIDAZOLE (FLAGYL) 500 MG tablet Take 1 tablet (500 mg total) by mouth 3 (three) times daily.  21 tablet  0  . Multiple Vitamin (MULTIVITAMIN WITH MINERALS) TABS tablet Take 1 tablet by mouth  daily.      . ondansetron (ZOFRAN) 8 MG tablet Take 1 tablet (8 mg total) by mouth every 8 (eight) hours as needed for nausea.  30 tablet  3  . promethazine (PHENERGAN) 25 MG tablet Take 1 tablet (25 mg total) by mouth every 6 (six) hours as needed for nausea.  30 tablet  3  . Tetrahydrozoline HCl (EYE DROPS OP) Apply to eye. Zylet 4 times daily      . Tetrahydrozoline HCl (EYE DROPS OP) Apply to eye. Lubricant 4 times a day       No current facility-administered medications for this visit.    PHYSICAL EXAMINATION: ECOG PERFORMANCE STATUS: 1 - Symptomatic but completely ambulatory  Filed Vitals:   08/16/13 1103  BP: 128/80  Pulse: 83  Temp: 97.8 F (36.6 C)  Resp: 19   Filed Weights   08/16/13 1103  Weight: 190 lb 8 oz (86.41 kg)    GENERAL:alert, no distress and comfortable SKIN: skin color, texture, turgor are normal, no rashes or significant lesions EYES: normal, Conjunctiva are pink and non-injected, sclera clear OROPHARYNX:no exudate, no erythema and lips, buccal mucosa, and tongue normal  NECK: supple, thyroid normal size, non-tender, without nodularity LYMPH:  no palpable lymphadenopathy in the cervical, axillary or inguinal LUNGS: clear to auscultation and percussion with normal breathing effort HEART: regular rate & rhythm and no murmurs and no lower extremity edema ABDOMEN:abdomen soft, mildly tender on palpation over the left flank area. Mild guarding is present. He has no rebound tenderness. Musculoskeletal:no cyanosis of digits and no clubbing  NEURO: alert & oriented x 3 with fluent speech, no focal motor/sensory deficits  LABORATORY DATA:  I have reviewed the data as listed    Component Value Date/Time   NA 141 08/16/2013 1030   NA 140 06/28/2013 0705   NA 139 07/20/2011 0800   K 4.2 08/16/2013 1030   K 3.9 06/28/2013 0705   K 4.4 07/20/2011 0800   CL 103 06/28/2013 0705   CL 104 03/24/2012 1024   CL 100 07/20/2011 0800   CO2 26 08/16/2013 1030   CO2 24 06/28/2013  0705   CO2 29 07/20/2011 0800   GLUCOSE 160* 08/16/2013 1030   GLUCOSE 111* 06/28/2013 0705   GLUCOSE 80 03/24/2012 1024   GLUCOSE 100 07/20/2011 0800   BUN 15.7 08/16/2013 1030   BUN 19 06/28/2013 0705   BUN 14 07/20/2011 0800   CREATININE 1.0 08/16/2013 1030   CREATININE 1.06 06/28/2013 0705   CREATININE 1.2 07/20/2011 0800   CALCIUM 9.3 08/16/2013 1030   CALCIUM 9.0 06/28/2013 0705   CALCIUM 8.7 07/20/2011 0800   PROT 6.4 08/16/2013 1030   PROT 6.5 06/28/2013 0705   PROT 7.2 07/20/2011 0800   ALBUMIN 3.5 08/16/2013 1030   ALBUMIN 3.8 06/28/2013 0705   AST 16 08/16/2013 1030   AST 23 06/28/2013 0705   AST 25 07/20/2011 0800   ALT 23 08/16/2013 1030   ALT 26 06/28/2013 0705   ALT 29 07/20/2011 0800   ALKPHOS 77 08/16/2013 1030  ALKPHOS 64 06/28/2013 0705   ALKPHOS 55 07/20/2011 0800   BILITOT 1.07 08/16/2013 1030   BILITOT 0.6 06/28/2013 0705   BILITOT 1.00 07/20/2011 0800   GFRNONAA 70* 06/28/2013 0705   GFRAA 81* 06/28/2013 0705    No results found for this basename: SPEP, UPEP,  kappa and lambda light chains    Lab Results  Component Value Date   WBC 7.9 08/16/2013   NEUTROABS 6.3 08/16/2013   HGB 14.7 08/16/2013   HCT 42.8 08/16/2013   MCV 87.3 08/16/2013   PLT 163 08/16/2013      Chemistry      Component Value Date/Time   NA 141 08/16/2013 1030   NA 140 06/28/2013 0705   NA 139 07/20/2011 0800   K 4.2 08/16/2013 1030   K 3.9 06/28/2013 0705   K 4.4 07/20/2011 0800   CL 103 06/28/2013 0705   CL 104 03/24/2012 1024   CL 100 07/20/2011 0800   CO2 26 08/16/2013 1030   CO2 24 06/28/2013 0705   CO2 29 07/20/2011 0800   BUN 15.7 08/16/2013 1030   BUN 19 06/28/2013 0705   BUN 14 07/20/2011 0800   CREATININE 1.0 08/16/2013 1030   CREATININE 1.06 06/28/2013 0705   CREATININE 1.2 07/20/2011 0800      Component Value Date/Time   CALCIUM 9.3 08/16/2013 1030   CALCIUM 9.0 06/28/2013 0705   CALCIUM 8.7 07/20/2011 0800   ALKPHOS 77 08/16/2013 1030   ALKPHOS 64 06/28/2013 0705   ALKPHOS 55 07/20/2011 0800   AST 16 08/16/2013 1030   AST 23 06/28/2013  0705   AST 25 07/20/2011 0800   ALT 23 08/16/2013 1030   ALT 26 06/28/2013 0705   ALT 29 07/20/2011 0800   BILITOT 1.07 08/16/2013 1030   BILITOT 0.6 06/28/2013 0705   BILITOT 1.00 07/20/2011 0800      ASSESSMENT & PLAN:  Diverticulitis Clinically, he does not appear septic. I recommend combination antibiotic therapy with ciprofloxacin and Flagyl.  Low grade B-cell lymphoma He tolerated last cycle of chemotherapy well. Continue supportive care.   No orders of the defined types were placed in this encounter.   All questions were answered. The patient knows to call the clinic with any problems, questions or concerns. No barriers to learning was detected. I spent 25 minutes counseling the patient face to face. The total time spent in the appointment was 30 minutes and more than 50% was on counseling and review of test results     [facility], [place], MD 08/16/2013 7:34 PM

## 2013-08-16 NOTE — Assessment & Plan Note (Signed)
He tolerated last cycle of chemotherapy well. Continue supportive care.

## 2013-08-16 NOTE — Assessment & Plan Note (Signed)
Clinically, he does not appear septic. I recommend combination antibiotic therapy with ciprofloxacin and Flagyl.

## 2013-08-16 NOTE — Telephone Encounter (Signed)
Wife reports pt started having lower abd pain yesterday afternoon.  It has now moved to his left side and feels like when he had diverticulitis in the past.  Denies any fevers, constipation, diarrhea or n/v.Anthony Kelly

## 2013-08-17 DIAGNOSIS — H179 Unspecified corneal scar and opacity: Secondary | ICD-10-CM | POA: Diagnosis not present

## 2013-08-17 DIAGNOSIS — H5231 Anisometropia: Secondary | ICD-10-CM | POA: Diagnosis not present

## 2013-08-17 DIAGNOSIS — H52229 Regular astigmatism, unspecified eye: Secondary | ICD-10-CM | POA: Diagnosis not present

## 2013-08-29 ENCOUNTER — Encounter: Payer: Self-pay | Admitting: Hematology and Oncology

## 2013-08-29 ENCOUNTER — Ambulatory Visit (HOSPITAL_BASED_OUTPATIENT_CLINIC_OR_DEPARTMENT_OTHER): Payer: Medicare Other

## 2013-08-29 ENCOUNTER — Other Ambulatory Visit (HOSPITAL_BASED_OUTPATIENT_CLINIC_OR_DEPARTMENT_OTHER): Payer: Medicare Other

## 2013-08-29 ENCOUNTER — Ambulatory Visit (HOSPITAL_BASED_OUTPATIENT_CLINIC_OR_DEPARTMENT_OTHER): Payer: Medicare Other | Admitting: Hematology and Oncology

## 2013-08-29 ENCOUNTER — Ambulatory Visit: Payer: Medicare Other

## 2013-08-29 ENCOUNTER — Telehealth: Payer: Self-pay | Admitting: Hematology and Oncology

## 2013-08-29 VITALS — BP 114/74 | HR 70 | Temp 97.8°F | Resp 16

## 2013-08-29 VITALS — BP 105/72 | HR 80 | Temp 97.9°F | Resp 18 | Ht 72.0 in | Wt 193.3 lb

## 2013-08-29 DIAGNOSIS — C8510 Unspecified B-cell lymphoma, unspecified site: Secondary | ICD-10-CM

## 2013-08-29 DIAGNOSIS — Z95828 Presence of other vascular implants and grafts: Secondary | ICD-10-CM

## 2013-08-29 DIAGNOSIS — C8589 Other specified types of non-Hodgkin lymphoma, extranodal and solid organ sites: Secondary | ICD-10-CM | POA: Diagnosis not present

## 2013-08-29 DIAGNOSIS — I480 Paroxysmal atrial fibrillation: Secondary | ICD-10-CM

## 2013-08-29 DIAGNOSIS — I4891 Unspecified atrial fibrillation: Secondary | ICD-10-CM

## 2013-08-29 DIAGNOSIS — K5732 Diverticulitis of large intestine without perforation or abscess without bleeding: Secondary | ICD-10-CM

## 2013-08-29 DIAGNOSIS — Z5111 Encounter for antineoplastic chemotherapy: Secondary | ICD-10-CM

## 2013-08-29 LAB — CBC WITH DIFFERENTIAL/PLATELET
BASO%: 1.1 % (ref 0.0–2.0)
BASOS ABS: 0.1 10*3/uL (ref 0.0–0.1)
EOS%: 6.7 % (ref 0.0–7.0)
Eosinophils Absolute: 0.6 10*3/uL — ABNORMAL HIGH (ref 0.0–0.5)
HCT: 42.7 % (ref 38.4–49.9)
HEMOGLOBIN: 14.1 g/dL (ref 13.0–17.1)
LYMPH%: 4.6 % — ABNORMAL LOW (ref 14.0–49.0)
MCH: 29.1 pg (ref 27.2–33.4)
MCHC: 33 g/dL (ref 32.0–36.0)
MCV: 88.1 fL (ref 79.3–98.0)
MONO#: 1.1 10*3/uL — ABNORMAL HIGH (ref 0.1–0.9)
MONO%: 12.4 % (ref 0.0–14.0)
NEUT#: 6.8 10*3/uL — ABNORMAL HIGH (ref 1.5–6.5)
NEUT%: 75.2 % — ABNORMAL HIGH (ref 39.0–75.0)
Platelets: 241 10*3/uL (ref 140–400)
RBC: 4.84 10*6/uL (ref 4.20–5.82)
RDW: 14.6 % (ref 11.0–14.6)
WBC: 9.1 10*3/uL (ref 4.0–10.3)
lymph#: 0.4 10*3/uL — ABNORMAL LOW (ref 0.9–3.3)

## 2013-08-29 LAB — COMPREHENSIVE METABOLIC PANEL (CC13)
ALT: 26 U/L (ref 0–55)
ANION GAP: 10 meq/L (ref 3–11)
AST: 22 U/L (ref 5–34)
Albumin: 3.3 g/dL — ABNORMAL LOW (ref 3.5–5.0)
Alkaline Phosphatase: 60 U/L (ref 40–150)
BUN: 14.1 mg/dL (ref 7.0–26.0)
CHLORIDE: 106 meq/L (ref 98–109)
CO2: 23 meq/L (ref 22–29)
Calcium: 9.4 mg/dL (ref 8.4–10.4)
Creatinine: 0.9 mg/dL (ref 0.7–1.3)
Glucose: 105 mg/dl (ref 70–140)
Potassium: 4.1 mEq/L (ref 3.5–5.1)
Sodium: 140 mEq/L (ref 136–145)
Total Bilirubin: 0.39 mg/dL (ref 0.20–1.20)
Total Protein: 6.3 g/dL — ABNORMAL LOW (ref 6.4–8.3)

## 2013-08-29 MED ORDER — DEXAMETHASONE SODIUM PHOSPHATE 10 MG/ML IJ SOLN
10.0000 mg | Freq: Once | INTRAMUSCULAR | Status: AC
  Administered 2013-08-29: 10 mg via INTRAVENOUS

## 2013-08-29 MED ORDER — ACETAMINOPHEN 325 MG PO TABS
ORAL_TABLET | ORAL | Status: AC
  Filled 2013-08-29: qty 2

## 2013-08-29 MED ORDER — RITUXIMAB CHEMO INJECTION 10 MG/ML
375.0000 mg/m2 | Freq: Once | INTRAVENOUS | Status: AC
  Administered 2013-08-29: 800 mg via INTRAVENOUS
  Filled 2013-08-29: qty 80

## 2013-08-29 MED ORDER — ONDANSETRON 8 MG/NS 50 ML IVPB
INTRAVENOUS | Status: AC
  Filled 2013-08-29: qty 8

## 2013-08-29 MED ORDER — ACETAMINOPHEN 325 MG PO TABS
650.0000 mg | ORAL_TABLET | Freq: Once | ORAL | Status: AC
  Administered 2013-08-29: 650 mg via ORAL

## 2013-08-29 MED ORDER — SODIUM CHLORIDE 0.9 % IV SOLN
Freq: Once | INTRAVENOUS | Status: AC
  Administered 2013-08-29: 10:00:00 via INTRAVENOUS

## 2013-08-29 MED ORDER — ONDANSETRON 8 MG/50ML IVPB (CHCC)
8.0000 mg | Freq: Once | INTRAVENOUS | Status: AC
  Administered 2013-08-29: 8 mg via INTRAVENOUS

## 2013-08-29 MED ORDER — DEXAMETHASONE SODIUM PHOSPHATE 10 MG/ML IJ SOLN
INTRAMUSCULAR | Status: AC
  Filled 2013-08-29: qty 1

## 2013-08-29 MED ORDER — DIPHENHYDRAMINE HCL 25 MG PO CAPS
ORAL_CAPSULE | ORAL | Status: AC
  Filled 2013-08-29: qty 2

## 2013-08-29 MED ORDER — SODIUM CHLORIDE 0.9 % IJ SOLN
10.0000 mL | INTRAMUSCULAR | Status: DC | PRN
  Administered 2013-08-29: 10 mL
  Filled 2013-08-29: qty 10

## 2013-08-29 MED ORDER — SODIUM CHLORIDE 0.9 % IV SOLN
90.0000 mg/m2 | Freq: Once | INTRAVENOUS | Status: AC
  Administered 2013-08-29: 189 mg via INTRAVENOUS
  Filled 2013-08-29: qty 2.1

## 2013-08-29 MED ORDER — HEPARIN SOD (PORK) LOCK FLUSH 100 UNIT/ML IV SOLN
500.0000 [IU] | Freq: Once | INTRAVENOUS | Status: AC | PRN
  Administered 2013-08-29: 500 [IU]
  Filled 2013-08-29: qty 5

## 2013-08-29 MED ORDER — SODIUM CHLORIDE 0.9 % IJ SOLN
10.0000 mL | INTRAMUSCULAR | Status: DC | PRN
  Administered 2013-08-29: 10 mL via INTRAVENOUS
  Filled 2013-08-29: qty 10

## 2013-08-29 MED ORDER — DIPHENHYDRAMINE HCL 25 MG PO CAPS
50.0000 mg | ORAL_CAPSULE | Freq: Once | ORAL | Status: AC
  Administered 2013-08-29: 50 mg via ORAL

## 2013-08-29 NOTE — Patient Instructions (Signed)
[facility] Discharge Instructions for Patients Receiving Chemotherapy  Today you received the following chemotherapy agents rituxan/treanda.    To help prevent nausea and vomiting after your treatment, we encourage you to take your nausea medication as directed.    If you develop nausea and vomiting that is not controlled by your nausea medication, call the clinic.   BELOW ARE SYMPTOMS THAT SHOULD BE REPORTED IMMEDIATELY:  *FEVER GREATER THAN 100.5 F  *CHILLS WITH OR WITHOUT FEVER  NAUSEA AND VOMITING THAT IS NOT CONTROLLED WITH YOUR NAUSEA MEDICATION  *UNUSUAL SHORTNESS OF BREATH  *UNUSUAL BRUISING OR BLEEDING  TENDERNESS IN MOUTH AND THROAT WITH OR WITHOUT PRESENCE OF ULCERS  *URINARY PROBLEMS  *BOWEL PROBLEMS  UNUSUAL RASH Items with * indicate a potential emergency and should be followed up as soon as possible.  Feel free to call the clinic you have any questions or concerns. The clinic phone number is [phone].

## 2013-08-29 NOTE — Assessment & Plan Note (Signed)
He tolerated last cycle of chemotherapy well. I will order a PET CT scan before cycle 4 of treatment to assess response to treatment.

## 2013-08-29 NOTE — Patient Instructions (Signed)

## 2013-08-29 NOTE — Assessment & Plan Note (Signed)
This has resolved.

## 2013-08-29 NOTE — Progress Notes (Signed)
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   12/12/2003 - 06/01/2004 Chemotherapy He was treated with R. CHOP chemotherapy which show complete remission. The number of cycles of R. CHOP chemotherapy was unknown.   12/19/2006 Surgery Lung resection show follicular lymphoma.   01/02/2007 - 09/01/2008 Chemotherapy The patient was treated with single agent rituximab alone.   01/19/2007 Bone Marrow Biopsy Bone marrow biopsy was negative.   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   05/11/2011 Initial Diagnosis Low grade B-cell lymphoma   05/08/2013 - 05/11/2013 Hospital Admission The patient was admitted to the hospital for management of pericarditis. CT scan showed extensive lymphadenopathy.   06/07/2013 Imaging PET/CT scan showed extensive lymphadenopathy   06/25/2013 Procedure He has placement of Infuse-a-Port.   06/28/2013 Bone Marrow Biopsy Bone marrow biopsy is positive for lymphoma involvement.   07/04/2013 -  Chemotherapy He is treated with cycle 1 of bendamustine with rituximab.    INTERVAL HISTORY: Please see below for problem oriented charting. Using prior to cycle 3 of therapy. Diverticulitis has resolved. He denies bone pain from Neulasta injection. He have mild tachycardia after chemotherapy which resolves subsequently.  REVIEW OF SYSTEMS:   Constitutional: Denies fevers, chills or abnormal weight loss Eyes: Denies blurriness of vision Ears, nose, mouth, throat, and face: Denies mucositis or sore throat Respiratory: Denies cough, dyspnea or wheezes Cardiovascular: Denies palpitation, chest discomfort or lower extremity swelling Gastrointestinal:  Denies nausea, heartburn or change in  bowel habits Skin: Denies abnormal skin rashes Lymphatics: Denies new lymphadenopathy or easy bruising Neurological:Denies numbness, tingling or new weaknesses Behavioral/Psych: Mood is stable, no new changes  All other systems were reviewed with the patient and are negative.  I have reviewed the past medical history, past surgical history, social history and family history with the patient and they are unchanged from previous note.  ALLERGIES:  has No Known Allergies.  MEDICATIONS:  Current Outpatient Prescriptions  Medication Sig Dispense Refill  . aspirin EC 81 MG tablet Take 81 mg by mouth every morning.      Marland Kitchen atorvastatin (LIPITOR) 10 MG tablet Take 5 mg by mouth at bedtime.       . carboxymethylcellulose (REFRESH PLUS) 0.5 % SOLN 1 drop 3 (three) times daily as needed.      . diltiazem (CARDIZEM CD) 120 MG 24 hr capsule Take 120 mg by mouth every morning.      Marland Kitchen ibuprofen (ADVIL,MOTRIN) 800 MG tablet Take 1 tablet (800 mg total) by mouth every 8 (eight) hours as needed.  30 tablet  0  . lidocaine-prilocaine (EMLA) cream Apply 1 application topically as needed.  30 g  0  . loratadine (CLARITIN) 10 MG tablet Take 10 mg by mouth daily.      . Menthol-Methyl Salicylate (MUSCLE RUB) 10-15 % CREA Apply 1 application topically as needed for muscle pain.      . metoprolol succinate (TOPROL-XL) 25 MG 24 hr tablet Take 25 mg by mouth every morning.      . Multiple Vitamin (MULTIVITAMIN WITH MINERALS) TABS tablet Take 1 tablet by mouth daily.      . ondansetron (ZOFRAN) 8 MG tablet Take 1 tablet (8 mg total) by mouth every 8 (eight) hours as needed for  nausea.  30 tablet  3  . Polyethyl Glycol-Propyl Glycol (SYSTANE) 0.4-0.3 % GEL Apply to eye.      . promethazine (PHENERGAN) 25 MG tablet Take 1 tablet (25 mg total) by mouth every 6 (six) hours as needed for nausea.  30 tablet  3  . psyllium (METAMUCIL) 58.6 % packet Take 1 packet by mouth daily.      . Tetrahydrozoline HCl (EYE DROPS OP)  Apply to eye. Zylet 4 times daily       No current facility-administered medications for this visit.    PHYSICAL EXAMINATION: ECOG PERFORMANCE STATUS: 0 - Asymptomatic  Filed Vitals:   08/29/13 0830  BP: 105/72  Pulse: 80  Temp: 97.9 F (36.6 C)  Resp: 18   Filed Weights   08/29/13 0830  Weight: 193 lb 4.8 oz (87.68 kg)    GENERAL:alert, no distress and comfortable SKIN: skin color, texture, turgor are normal, no rashes or significant lesions EYES: normal, Conjunctiva are pink and non-injected, sclera clear OROPHARYNX:no exudate, no erythema and lips, buccal mucosa, and tongue normal  NECK: supple, thyroid normal size, non-tender, without nodularity LYMPH:  no palpable lymphadenopathy in the cervical, axillary or inguinal LUNGS: clear to auscultation and percussion with normal breathing effort HEART: regular rate & rhythm and no murmurs and no lower extremity edema ABDOMEN:abdomen soft, non-tender and normal bowel sounds Musculoskeletal:no cyanosis of digits and no clubbing  NEURO: alert & oriented x 3 with fluent speech, no focal motor/sensory deficits  LABORATORY DATA:  I have reviewed the data as listed    Component Value Date/Time   NA 140 08/29/2013 0800   NA 140 06/28/2013 0705   NA 139 07/20/2011 0800   K 4.1 08/29/2013 0800   K 3.9 06/28/2013 0705   K 4.4 07/20/2011 0800   CL 103 06/28/2013 0705   CL 104 03/24/2012 1024   CL 100 07/20/2011 0800   CO2 23 08/29/2013 0800   CO2 24 06/28/2013 0705   CO2 29 07/20/2011 0800   GLUCOSE 105 08/29/2013 0800   GLUCOSE 111* 06/28/2013 0705   GLUCOSE 80 03/24/2012 1024   GLUCOSE 100 07/20/2011 0800   BUN 14.1 08/29/2013 0800   BUN 19 06/28/2013 0705   BUN 14 07/20/2011 0800   CREATININE 0.9 08/29/2013 0800   CREATININE 1.06 06/28/2013 0705   CREATININE 1.2 07/20/2011 0800   CALCIUM 9.4 08/29/2013 0800   CALCIUM 9.0 06/28/2013 0705   CALCIUM 8.7 07/20/2011 0800   PROT 6.3* 08/29/2013 0800   PROT 6.5 06/28/2013 0705   PROT 7.2 07/20/2011 0800    ALBUMIN 3.3* 08/29/2013 0800   ALBUMIN 3.8 06/28/2013 0705   AST 22 08/29/2013 0800   AST 23 06/28/2013 0705   AST 25 07/20/2011 0800   ALT 26 08/29/2013 0800   ALT 26 06/28/2013 0705   ALT 29 07/20/2011 0800   ALKPHOS 60 08/29/2013 0800   ALKPHOS 64 06/28/2013 0705   ALKPHOS 55 07/20/2011 0800   BILITOT 0.39 08/29/2013 0800   BILITOT 0.6 06/28/2013 0705   BILITOT 1.00 07/20/2011 0800   GFRNONAA 70* 06/28/2013 0705   GFRAA 81* 06/28/2013 0705    No results found for this basename: SPEP, UPEP,  kappa and lambda light chains    Lab Results  Component Value Date   WBC 9.1 08/29/2013   NEUTROABS 6.8* 08/29/2013   HGB 14.1 08/29/2013   HCT 42.7 08/29/2013   MCV 88.1 08/29/2013   PLT 241 08/29/2013      Chemistry  Component Value Date/Time   NA 140 08/29/2013 0800   NA 140 06/28/2013 0705   NA 139 07/20/2011 0800   K 4.1 08/29/2013 0800   K 3.9 06/28/2013 0705   K 4.4 07/20/2011 0800   CL 103 06/28/2013 0705   CL 104 03/24/2012 1024   CL 100 07/20/2011 0800   CO2 23 08/29/2013 0800   CO2 24 06/28/2013 0705   CO2 29 07/20/2011 0800   BUN 14.1 08/29/2013 0800   BUN 19 06/28/2013 0705   BUN 14 07/20/2011 0800   CREATININE 0.9 08/29/2013 0800   CREATININE 1.06 06/28/2013 0705   CREATININE 1.2 07/20/2011 0800      Component Value Date/Time   CALCIUM 9.4 08/29/2013 0800   CALCIUM 9.0 06/28/2013 0705   CALCIUM 8.7 07/20/2011 0800   ALKPHOS 60 08/29/2013 0800   ALKPHOS 64 06/28/2013 0705   ALKPHOS 55 07/20/2011 0800   AST 22 08/29/2013 0800   AST 23 06/28/2013 0705   AST 25 07/20/2011 0800   ALT 26 08/29/2013 0800   ALT 26 06/28/2013 0705   ALT 29 07/20/2011 0800   BILITOT 0.39 08/29/2013 0800   BILITOT 0.6 06/28/2013 0705   BILITOT 1.00 07/20/2011 0800     ASSESSMENT & PLAN:  Low grade B-cell lymphoma He tolerated last cycle of chemotherapy well. I will order a PET CT scan before cycle 4 of treatment to assess response to treatment.    PAF (paroxysmal atrial fibrillation) This is well controlled with current beta  blocker regimen  Diverticulitis This has resolved.   Orders Placed This Encounter  Procedures  . NM PET Image Restag (PS) Skull Base To Thigh    Standing Status: Future     Number of Occurrences:      Standing Expiration Date: 10/29/2014    Order Specific Question:  Reason for Exam (SYMPTOM  OR DIAGNOSIS REQUIRED)    Answer:  follicular lymphoma, assess response to Rx    Order Specific Question:  Preferred imaging location?    Answer:  [facility]   All questions were answered. The patient knows to call the clinic with any problems, questions or concerns. No barriers to learning was detected. I spent 25 minutes counseling the patient face to face. The total time spent in the appointment was 30 minutes and more than 50% was on counseling and review of test results     [facility], [place], MD 08/29/2013 8:56 AM

## 2013-08-29 NOTE — Assessment & Plan Note (Signed)
This is well controlled with current beta blocker regimen

## 2013-08-29 NOTE — Telephone Encounter (Signed)
gv and printed appt scheda dn avs for pt for Aug and SEpt....sed added tx.

## 2013-08-30 ENCOUNTER — Ambulatory Visit (HOSPITAL_BASED_OUTPATIENT_CLINIC_OR_DEPARTMENT_OTHER): Payer: Medicare Other

## 2013-08-30 VITALS — BP 127/74 | HR 73 | Temp 97.6°F

## 2013-08-30 DIAGNOSIS — C8589 Other specified types of non-Hodgkin lymphoma, extranodal and solid organ sites: Secondary | ICD-10-CM | POA: Diagnosis not present

## 2013-08-30 DIAGNOSIS — Z5111 Encounter for antineoplastic chemotherapy: Secondary | ICD-10-CM | POA: Diagnosis not present

## 2013-08-30 DIAGNOSIS — C8510 Unspecified B-cell lymphoma, unspecified site: Secondary | ICD-10-CM

## 2013-08-30 MED ORDER — ONDANSETRON 8 MG/NS 50 ML IVPB
INTRAVENOUS | Status: AC
  Filled 2013-08-30: qty 8

## 2013-08-30 MED ORDER — DEXAMETHASONE SODIUM PHOSPHATE 10 MG/ML IJ SOLN
10.0000 mg | Freq: Once | INTRAMUSCULAR | Status: AC
  Administered 2013-08-30: 10 mg via INTRAVENOUS

## 2013-08-30 MED ORDER — HEPARIN SOD (PORK) LOCK FLUSH 100 UNIT/ML IV SOLN
500.0000 [IU] | Freq: Once | INTRAVENOUS | Status: AC | PRN
  Administered 2013-08-30: 500 [IU]
  Filled 2013-08-30: qty 5

## 2013-08-30 MED ORDER — SODIUM CHLORIDE 0.9 % IV SOLN
90.0000 mg/m2 | Freq: Once | INTRAVENOUS | Status: AC
  Administered 2013-08-30: 189 mg via INTRAVENOUS
  Filled 2013-08-30: qty 2.1

## 2013-08-30 MED ORDER — SODIUM CHLORIDE 0.9 % IV SOLN
Freq: Once | INTRAVENOUS | Status: AC
  Administered 2013-08-30: 09:00:00 via INTRAVENOUS

## 2013-08-30 MED ORDER — ONDANSETRON 8 MG/50ML IVPB (CHCC)
8.0000 mg | Freq: Once | INTRAVENOUS | Status: AC
  Administered 2013-08-30: 8 mg via INTRAVENOUS

## 2013-08-30 MED ORDER — DEXAMETHASONE SODIUM PHOSPHATE 10 MG/ML IJ SOLN
INTRAMUSCULAR | Status: AC
  Filled 2013-08-30: qty 1

## 2013-08-30 MED ORDER — SODIUM CHLORIDE 0.9 % IJ SOLN
10.0000 mL | INTRAMUSCULAR | Status: DC | PRN
Start: 2013-08-30 — End: 2013-08-30
  Administered 2013-08-30: 10 mL
  Filled 2013-08-30: qty 10

## 2013-08-30 NOTE — Patient Instructions (Signed)
[facility] Discharge Instructions for Patients Receiving Chemotherapy  Today you received the following chemotherapy agents: Treanda  To help prevent nausea and vomiting after your treatment, we encourage you to take your nausea medication: promethazine 25mg  every 6 hours as needed.   If you develop nausea and vomiting that is not controlled by your nausea medication, call the clinic.   BELOW ARE SYMPTOMS THAT SHOULD BE REPORTED IMMEDIATELY:  *FEVER GREATER THAN 100.5 F  *CHILLS WITH OR WITHOUT FEVER  NAUSEA AND VOMITING THAT IS NOT CONTROLLED WITH YOUR NAUSEA MEDICATION  *UNUSUAL SHORTNESS OF BREATH  *UNUSUAL BRUISING OR BLEEDING  TENDERNESS IN MOUTH AND THROAT WITH OR WITHOUT PRESENCE OF ULCERS  *URINARY PROBLEMS  *BOWEL PROBLEMS  UNUSUAL RASH Items with * indicate a potential emergency and should be followed up as soon as possible.  Feel free to call the clinic you have any questions or concerns. The clinic phone number is [phone].

## 2013-08-31 ENCOUNTER — Ambulatory Visit (HOSPITAL_BASED_OUTPATIENT_CLINIC_OR_DEPARTMENT_OTHER): Payer: Medicare Other

## 2013-08-31 VITALS — BP 114/72 | HR 66

## 2013-08-31 DIAGNOSIS — C8589 Other specified types of non-Hodgkin lymphoma, extranodal and solid organ sites: Secondary | ICD-10-CM | POA: Diagnosis not present

## 2013-08-31 DIAGNOSIS — C8510 Unspecified B-cell lymphoma, unspecified site: Secondary | ICD-10-CM

## 2013-08-31 DIAGNOSIS — Z5189 Encounter for other specified aftercare: Secondary | ICD-10-CM

## 2013-08-31 MED ORDER — PEGFILGRASTIM INJECTION 6 MG/0.6ML
6.0000 mg | Freq: Once | SUBCUTANEOUS | Status: AC
  Administered 2013-08-31: 6 mg via SUBCUTANEOUS
  Filled 2013-08-31: qty 0.6

## 2013-09-18 ENCOUNTER — Ambulatory Visit: Payer: Medicare Other | Admitting: Hematology and Oncology

## 2013-09-18 ENCOUNTER — Other Ambulatory Visit: Payer: Medicare Other

## 2013-09-24 ENCOUNTER — Other Ambulatory Visit (HOSPITAL_BASED_OUTPATIENT_CLINIC_OR_DEPARTMENT_OTHER): Payer: Medicare Other

## 2013-09-24 ENCOUNTER — Ambulatory Visit (HOSPITAL_BASED_OUTPATIENT_CLINIC_OR_DEPARTMENT_OTHER): Payer: Medicare Other

## 2013-09-24 ENCOUNTER — Ambulatory Visit (HOSPITAL_COMMUNITY)
Admission: RE | Admit: 2013-09-24 | Discharge: 2013-09-24 | Disposition: A | Discharge status: Home / Self Care | Payer: Medicare Other | Source: Ambulatory Visit | Attending: Hematology and Oncology | Admitting: Hematology and Oncology

## 2013-09-24 ENCOUNTER — Telehealth: Payer: Self-pay | Admitting: *Deleted

## 2013-09-24 VITALS — BP 123/77 | HR 76 | Temp 98.0°F

## 2013-09-24 DIAGNOSIS — C8589 Other specified types of non-Hodgkin lymphoma, extranodal and solid organ sites: Secondary | ICD-10-CM

## 2013-09-24 DIAGNOSIS — Z95828 Presence of other vascular implants and grafts: Secondary | ICD-10-CM

## 2013-09-24 DIAGNOSIS — C8510 Unspecified B-cell lymphoma, unspecified site: Secondary | ICD-10-CM

## 2013-09-24 LAB — COMPREHENSIVE METABOLIC PANEL (CC13)
ALT: 26 U/L (ref 0–55)
ANION GAP: 10 meq/L (ref 3–11)
AST: 24 U/L (ref 5–34)
Albumin: 3.6 g/dL (ref 3.5–5.0)
Alkaline Phosphatase: 71 U/L (ref 40–150)
BILIRUBIN TOTAL: 0.63 mg/dL (ref 0.20–1.20)
BUN: 13.7 mg/dL (ref 7.0–26.0)
CALCIUM: 8.9 mg/dL (ref 8.4–10.4)
CHLORIDE: 108 meq/L (ref 98–109)
CO2: 23 meq/L (ref 22–29)
Creatinine: 0.9 mg/dL (ref 0.7–1.3)
Glucose: 111 mg/dl (ref 70–140)
Potassium: 3.9 mEq/L (ref 3.5–5.1)
SODIUM: 141 meq/L (ref 136–145)
Total Protein: 6.3 g/dL — ABNORMAL LOW (ref 6.4–8.3)

## 2013-09-24 LAB — CBC WITH DIFFERENTIAL/PLATELET
BASO%: 1.1 % (ref 0.0–2.0)
Basophils Absolute: 0.1 10*3/uL (ref 0.0–0.1)
EOS%: 7.3 % — AB (ref 0.0–7.0)
Eosinophils Absolute: 0.7 10*3/uL — ABNORMAL HIGH (ref 0.0–0.5)
HCT: 42.9 % (ref 38.4–49.9)
HGB: 14.4 g/dL (ref 13.0–17.1)
LYMPH#: 2 10*3/uL (ref 0.9–3.3)
LYMPH%: 22 % (ref 14.0–49.0)
MCH: 29.7 pg (ref 27.2–33.4)
MCHC: 33.5 g/dL (ref 32.0–36.0)
MCV: 88.6 fL (ref 79.3–98.0)
MONO#: 1 10*3/uL — ABNORMAL HIGH (ref 0.1–0.9)
MONO%: 10.7 % (ref 0.0–14.0)
NEUT#: 5.3 10*3/uL (ref 1.5–6.5)
NEUT%: 58.9 % (ref 39.0–75.0)
Platelets: 183 10*3/uL (ref 140–400)
RBC: 4.85 10*6/uL (ref 4.20–5.82)
RDW: 15.1 % — ABNORMAL HIGH (ref 11.0–14.6)
WBC: 9 10*3/uL (ref 4.0–10.3)

## 2013-09-24 LAB — GLUCOSE, CAPILLARY: GLUCOSE-CAPILLARY: 102 mg/dL — AB (ref 70–99)

## 2013-09-24 IMAGING — CT NM PET TUM IMG RESTAG (PS) SKULL BASE T - THIGH
1 of 8 series · 1 of 25 positions shown · non-contrast
Comparison: [DATE]

CLINICAL DATA: Subsequent treatment strategy for lymphoma.

EXAM:
NUCLEAR MEDICINE PET SKULL BASE TO THIGH
TECHNIQUE: 8.8 mCi F-18 FDG was injected intravenously. Full-ring PET imaging
was performed from the skull base to thigh after the radiotracer. CT
data was obtained and used for attenuation correction and anatomic
localization.
FASTING BLOOD GLUCOSE:  Value: 102 mg/dl

[Series 4: ct sk_thigh 5.0 b31f · axial · 5.0mm · 0.93mm/px · 1 of 220 slices shown]
[im 220/220  brain]
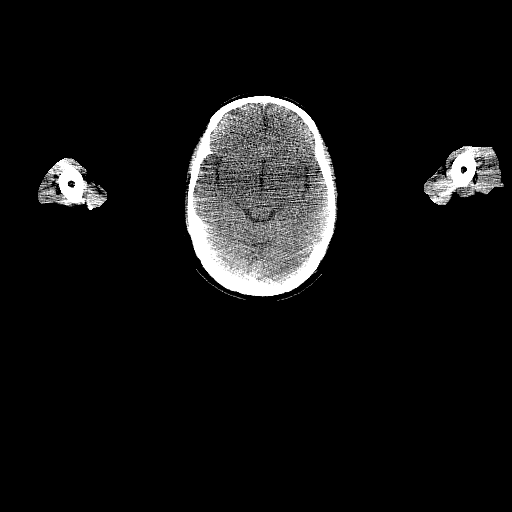

[1 of 25 positions shown; findings below may reference images not displayed]

FINDINGS: NECK

No hypermetabolic lymph nodes in the neck.

CHEST

Small AP window lymph node has an SUV max equal to 2.9. This is
compared with 3.0 previously. Resolution of previous index right
paraspinal hypermetabolic soft tissue. Moderate size hiatal hernia
is again noted.

Changes of mild paraseptal emphysema noted. No hyper metabolic
pulmonary nodules identified.

ABDOMEN/PELVIS:

No abnormal uptake identified within the liver, spleen, pancreas or
adrenal glands. There has been interval resolution of index left
periaortic lymph nodes at the level of the inferior pole of kidneys.
Index mesenteric adenopathy below the level of the pancreatic head
measures 4 mm in short axis and has an SUV max equal to 1.7.
Previously 1.1 cm and SUV max of 6.8. The index lymph node adjacent
to the hiatal hernia at the hiatus measures 6 mm short axis and has
an SUV max equal to 1.3. Previously this measured 1.3 cm and had an
SUV max equal to 5.1. The index right external iliac lymph node
measures 6 mm and has an SUV max equal 0.95. Previously this
measured 2.6 cm and had an SUV max of 10.7.

SKELETON

Index increased uptake within the right second rib has resolved in
the interval. Right sixth rib lesion stress set hyper metabolism
associated with right 6 rib lesion has also resolved in the
interval. On today's study there is uniform distribution of the
radiopharmaceutical throughout the axial and appendicular skeleton.
IMPRESSION: 1. Interval response to therapy.
2. There has been resolution of hypermetabolism associated with
previous index lesions within the chest abdomen and pelvis.
3. No new or progressive disease identified.
4. A uniform distribution of the radiopharmaceutical is now
identified throughout the axial and appendicular skeleton which may
recent plaque hyper stimulated bone marrow. This may diminished
sensitivity for detecting bone marrow involvement by lymphoma.
However, no residual areas of asymmetric increased uptake identified
to suggest bone involvement by lymphoma.

## 2013-09-24 MED ORDER — FLUDEOXYGLUCOSE F - 18 (FDG) INJECTION
8.8000 | Freq: Once | INTRAVENOUS | Status: AC | PRN
  Administered 2013-09-24: 8.8 via INTRAVENOUS

## 2013-09-24 MED ORDER — HEPARIN SOD (PORK) LOCK FLUSH 100 UNIT/ML IV SOLN
500.0000 [IU] | Freq: Once | INTRAVENOUS | Status: AC
  Administered 2013-09-24: 500 [IU] via INTRAVENOUS
  Filled 2013-09-24: qty 5

## 2013-09-24 MED ORDER — SODIUM CHLORIDE 0.9 % IJ SOLN
10.0000 mL | INTRAMUSCULAR | Status: DC | PRN
  Administered 2013-09-24: 10 mL via INTRAVENOUS
  Filled 2013-09-24: qty 10

## 2013-09-24 NOTE — Telephone Encounter (Signed)
No I did not.  

## 2013-09-24 NOTE — Patient Instructions (Signed)

## 2013-09-24 NOTE — Telephone Encounter (Signed)
Informed wife Dr. Homero Fellers did not try to call.  She verbalized understanding and states she did have VM from [place] about pt's schedule.  She confirmed appt.

## 2013-09-24 NOTE — Telephone Encounter (Signed)
Wife states she was returning a call to our office.  She missed a call and was wondering if Dr. Alvy Bimler had tried to call her?

## 2013-09-26 ENCOUNTER — Encounter: Payer: Self-pay | Admitting: Hematology and Oncology

## 2013-09-26 ENCOUNTER — Ambulatory Visit (HOSPITAL_BASED_OUTPATIENT_CLINIC_OR_DEPARTMENT_OTHER): Payer: Medicare Other | Admitting: Hematology and Oncology

## 2013-09-26 ENCOUNTER — Ambulatory Visit (HOSPITAL_BASED_OUTPATIENT_CLINIC_OR_DEPARTMENT_OTHER): Payer: Medicare Other

## 2013-09-26 ENCOUNTER — Telehealth: Payer: Self-pay | Admitting: Hematology and Oncology

## 2013-09-26 VITALS — BP 108/67 | HR 73 | Temp 97.5°F | Resp 16

## 2013-09-26 VITALS — BP 112/74 | HR 85 | Temp 97.7°F | Resp 18 | Ht 72.0 in | Wt 193.5 lb

## 2013-09-26 DIAGNOSIS — Z23 Encounter for immunization: Secondary | ICD-10-CM

## 2013-09-26 DIAGNOSIS — C8510 Unspecified B-cell lymphoma, unspecified site: Secondary | ICD-10-CM

## 2013-09-26 DIAGNOSIS — Z5112 Encounter for antineoplastic immunotherapy: Secondary | ICD-10-CM

## 2013-09-26 DIAGNOSIS — C8589 Other specified types of non-Hodgkin lymphoma, extranodal and solid organ sites: Secondary | ICD-10-CM

## 2013-09-26 DIAGNOSIS — Z5111 Encounter for antineoplastic chemotherapy: Secondary | ICD-10-CM | POA: Diagnosis not present

## 2013-09-26 MED ORDER — ACETAMINOPHEN 325 MG PO TABS
650.0000 mg | ORAL_TABLET | Freq: Once | ORAL | Status: AC
  Administered 2013-09-26: 650 mg via ORAL

## 2013-09-26 MED ORDER — DEXAMETHASONE SODIUM PHOSPHATE 10 MG/ML IJ SOLN
10.0000 mg | Freq: Once | INTRAMUSCULAR | Status: AC
  Administered 2013-09-26: 10 mg via INTRAVENOUS

## 2013-09-26 MED ORDER — SODIUM CHLORIDE 0.9 % IJ SOLN
10.0000 mL | INTRAMUSCULAR | Status: DC | PRN
  Administered 2013-09-26: 10 mL
  Filled 2013-09-26: qty 10

## 2013-09-26 MED ORDER — ONDANSETRON 8 MG/NS 50 ML IVPB
INTRAVENOUS | Status: AC
  Filled 2013-09-26: qty 8

## 2013-09-26 MED ORDER — HEPARIN SOD (PORK) LOCK FLUSH 100 UNIT/ML IV SOLN
500.0000 [IU] | Freq: Once | INTRAVENOUS | Status: AC | PRN
  Administered 2013-09-26: 500 [IU]
  Filled 2013-09-26: qty 5

## 2013-09-26 MED ORDER — SODIUM CHLORIDE 0.9 % IV SOLN
Freq: Once | INTRAVENOUS | Status: AC
  Administered 2013-09-26: 10:00:00 via INTRAVENOUS

## 2013-09-26 MED ORDER — SODIUM CHLORIDE 0.9 % IV SOLN
375.0000 mg/m2 | Freq: Once | INTRAVENOUS | Status: AC
  Administered 2013-09-26: 800 mg via INTRAVENOUS
  Filled 2013-09-26: qty 80

## 2013-09-26 MED ORDER — DIPHENHYDRAMINE HCL 25 MG PO CAPS
ORAL_CAPSULE | ORAL | Status: AC
  Filled 2013-09-26: qty 2

## 2013-09-26 MED ORDER — IBUPROFEN 800 MG PO TABS: 800.0000 mg | ORAL_TABLET | Freq: Three times a day (TID) | ORAL | Status: DC | PRN

## 2013-09-26 MED ORDER — DEXAMETHASONE SODIUM PHOSPHATE 10 MG/ML IJ SOLN
INTRAMUSCULAR | Status: AC
  Filled 2013-09-26: qty 1

## 2013-09-26 MED ORDER — ACETAMINOPHEN 325 MG PO TABS
ORAL_TABLET | ORAL | Status: AC
  Filled 2013-09-26: qty 2

## 2013-09-26 MED ORDER — ONDANSETRON 8 MG/50ML IVPB (CHCC)
8.0000 mg | Freq: Once | INTRAVENOUS | Status: AC
  Administered 2013-09-26: 8 mg via INTRAVENOUS

## 2013-09-26 MED ORDER — SODIUM CHLORIDE 0.9 % IV SOLN
90.0000 mg/m2 | Freq: Once | INTRAVENOUS | Status: AC
  Administered 2013-09-26: 189 mg via INTRAVENOUS
  Filled 2013-09-26: qty 2.1

## 2013-09-26 MED ORDER — DIPHENHYDRAMINE HCL 25 MG PO CAPS
50.0000 mg | ORAL_CAPSULE | Freq: Once | ORAL | Status: AC
  Administered 2013-09-26: 50 mg via ORAL

## 2013-09-26 NOTE — Progress Notes (Signed)
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   12/12/2003 - 06/01/2004 Chemotherapy He was treated with R. CHOP chemotherapy which show complete remission. The number of cycles of R. CHOP chemotherapy was unknown.   12/19/2006 Surgery Lung resection show follicular lymphoma.   01/02/2007 - 09/01/2008 Chemotherapy The patient was treated with single agent rituximab alone.   01/19/2007 Bone Marrow Biopsy Bone marrow biopsy was negative.   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   05/11/2011 Initial Diagnosis Low grade B-cell lymphoma   05/08/2013 - 05/11/2013 Hospital Admission The patient was admitted to the hospital for management of pericarditis. CT scan showed extensive lymphadenopathy.   06/07/2013 Imaging PET/CT scan showed extensive lymphadenopathy   06/25/2013 Procedure He has placement of Infuse-a-Port.   06/28/2013 Bone Marrow Biopsy Bone marrow biopsy is positive for lymphoma involvement.   07/04/2013 -  Chemotherapy He is treated with cycle 1 of bendamustine with rituximab.   09/24/2013 Imaging Repeat PET scan show complete remission.    INTERVAL HISTORY: Please see below for problem oriented charting. He is seen prior to cycle 4 of chemotherapy. Apart from fatigue and mild bone pain, he denies any side effects from prior treatment.  REVIEW OF SYSTEMS:   Constitutional: Denies fevers, chills or abnormal weight loss Eyes: Denies blurriness of vision Ears, nose, mouth, throat,  and face: Denies mucositis or sore throat Respiratory: Denies cough, dyspnea or wheezes Cardiovascular: Denies palpitation, chest discomfort or lower extremity swelling Gastrointestinal:  Denies nausea, heartburn or change in bowel habits Skin: Denies abnormal skin rashes Lymphatics: Denies new lymphadenopathy or easy bruising Neurological:Denies numbness, tingling or new weaknesses Behavioral/Psych: Mood is stable, no new changes  All other systems were reviewed with the patient and are negative.  I have reviewed the past medical history, past surgical history, social history and family history with the patient and they are unchanged from previous note.  ALLERGIES:  has No Known Allergies.  MEDICATIONS:  Current Outpatient Prescriptions  Medication Sig Dispense Refill  . aspirin EC 81 MG tablet Take 81 mg by mouth every morning.      Marland Kitchen atorvastatin (LIPITOR) 10 MG tablet Take 5 mg by mouth at bedtime.       . carboxymethylcellulose (REFRESH PLUS) 0.5 % SOLN 1 drop 3 (three) times daily as needed.      . diltiazem (CARDIZEM CD) 120 MG 24 hr capsule Take 120 mg by mouth every morning.      Marland Kitchen ibuprofen (ADVIL,MOTRIN) 800 MG tablet Take 1 tablet (800 mg total) by mouth every 8 (eight) hours as needed.  30 tablet  0  . lidocaine-prilocaine (EMLA) cream Apply 1 application topically as needed.  30 g  0  . loratadine (CLARITIN) 10 MG tablet Take 10 mg by mouth daily.      . Menthol-Methyl Salicylate (MUSCLE RUB) 10-15 % CREA Apply 1 application topically as needed for muscle pain.      . metoprolol succinate (TOPROL-XL) 25 MG 24  hr tablet Take 25 mg by mouth every morning.      . Multiple Vitamin (MULTIVITAMIN WITH MINERALS) TABS tablet Take 1 tablet by mouth daily.      . ondansetron (ZOFRAN) 8 MG tablet Take 1 tablet (8 mg total) by mouth every 8 (eight) hours as needed for nausea.  30 tablet  3  . Polyethyl Glycol-Propyl Glycol (SYSTANE) 0.4-0.3 % GEL Apply to eye.      . promethazine  (PHENERGAN) 25 MG tablet Take 1 tablet (25 mg total) by mouth every 6 (six) hours as needed for nausea.  30 tablet  3  . psyllium (METAMUCIL) 58.6 % packet Take 1 packet by mouth daily.       No current facility-administered medications for this visit.    PHYSICAL EXAMINATION: ECOG PERFORMANCE STATUS: 0 - Asymptomatic  Filed Vitals:   09/26/13 0837  BP: 112/74  Pulse: 85  Temp: 97.7 F (36.5 C)  Resp: 18   Filed Weights   09/26/13 0837  Weight: 193 lb 8 oz (87.771 kg)    GENERAL:alert, no distress and comfortable SKIN: skin color, texture, turgor are normal, no rashes or significant lesions EYES: normal, Conjunctiva are pink and non-injected, sclera clear OROPHARYNX:no exudate, no erythema and lips, buccal mucosa, and tongue normal  NECK: supple, thyroid normal size, non-tender, without nodularity LYMPH:  no palpable lymphadenopathy in the cervical, axillary or inguinal LUNGS: clear to auscultation and percussion with normal breathing effort HEART: regular rate & rhythm and no murmurs and no lower extremity edema ABDOMEN:abdomen soft, non-tender and normal bowel sounds Musculoskeletal:no cyanosis of digits and no clubbing  NEURO: alert & oriented x 3 with fluent speech, no focal motor/sensory deficits  LABORATORY DATA:  I have reviewed the data as listed    Component Value Date/Time   NA 141 09/24/2013 0814   NA 140 06/28/2013 0705   NA 139 07/20/2011 0800   K 3.9 09/24/2013 0814   K 3.9 06/28/2013 0705   K 4.4 07/20/2011 0800   CL 103 06/28/2013 0705   CL 104 03/24/2012 1024   CL 100 07/20/2011 0800   CO2 23 09/24/2013 0814   CO2 24 06/28/2013 0705   CO2 29 07/20/2011 0800   GLUCOSE 111 09/24/2013 0814   GLUCOSE 111* 06/28/2013 0705   GLUCOSE 80 03/24/2012 1024   GLUCOSE 100 07/20/2011 0800   BUN 13.7 09/24/2013 0814   BUN 19 06/28/2013 0705   BUN 14 07/20/2011 0800   CREATININE 0.9 09/24/2013 0814   CREATININE 1.06 06/28/2013 0705   CREATININE 1.2 07/20/2011 0800   CALCIUM 8.9  09/24/2013 0814   CALCIUM 9.0 06/28/2013 0705   CALCIUM 8.7 07/20/2011 0800   PROT 6.3* 09/24/2013 0814   PROT 6.5 06/28/2013 0705   PROT 7.2 07/20/2011 0800   ALBUMIN 3.6 09/24/2013 0814   ALBUMIN 3.8 06/28/2013 0705   AST 24 09/24/2013 0814   AST 23 06/28/2013 0705   AST 25 07/20/2011 0800   ALT 26 09/24/2013 0814   ALT 26 06/28/2013 0705   ALT 29 07/20/2011 0800   ALKPHOS 71 09/24/2013 0814   ALKPHOS 64 06/28/2013 0705   ALKPHOS 55 07/20/2011 0800   BILITOT 0.63 09/24/2013 0814   BILITOT 0.6 06/28/2013 0705   BILITOT 1.00 07/20/2011 0800   GFRNONAA 70* 06/28/2013 0705   GFRAA 81* 06/28/2013 0705    No results found for this basename: SPEP, UPEP,  kappa and lambda light chains    Lab Results  Component Value Date  WBC 9.0 09/24/2013   NEUTROABS 5.3 09/24/2013   HGB 14.4 09/24/2013   HCT 42.9 09/24/2013   MCV 88.6 09/24/2013   PLT 183 09/24/2013      Chemistry      Component Value Date/Time   NA 141 09/24/2013 0814   NA 140 06/28/2013 0705   NA 139 07/20/2011 0800   K 3.9 09/24/2013 0814   K 3.9 06/28/2013 0705   K 4.4 07/20/2011 0800   CL 103 06/28/2013 0705   CL 104 03/24/2012 1024   CL 100 07/20/2011 0800   CO2 23 09/24/2013 0814   CO2 24 06/28/2013 0705   CO2 29 07/20/2011 0800   BUN 13.7 09/24/2013 0814   BUN 19 06/28/2013 0705   BUN 14 07/20/2011 0800   CREATININE 0.9 09/24/2013 0814   CREATININE 1.06 06/28/2013 0705   CREATININE 1.2 07/20/2011 0800      Component Value Date/Time   CALCIUM 8.9 09/24/2013 0814   CALCIUM 9.0 06/28/2013 0705   CALCIUM 8.7 07/20/2011 0800   ALKPHOS 71 09/24/2013 0814   ALKPHOS 64 06/28/2013 0705   ALKPHOS 55 07/20/2011 0800   AST 24 09/24/2013 0814   AST 23 06/28/2013 0705   AST 25 07/20/2011 0800   ALT 26 09/24/2013 0814   ALT 26 06/28/2013 0705   ALT 29 07/20/2011 0800   BILITOT 0.63 09/24/2013 0814   BILITOT 0.6 06/28/2013 0705   BILITOT 1.00 07/20/2011 0800       RADIOGRAPHIC STUDIES: I have personally reviewed the radiological images as listed and agreed with the findings  in the report. Nm Pet Image Restag (ps) Skull Base To Thigh  09/24/2013   CLINICAL DATA:  Subsequent treatment strategy for lymphoma.  EXAM: NUCLEAR MEDICINE PET SKULL BASE TO THIGH  TECHNIQUE: 8.8 mCi F-18 FDG was injected intravenously. Full-ring PET imaging was performed from the skull base to thigh after the radiotracer. CT data was obtained and used for attenuation correction and anatomic localization.  FASTING BLOOD GLUCOSE:  Value: 102 mg/dl  COMPARISON:  06/07/2013  FINDINGS: NECK  No hypermetabolic lymph nodes in the neck.  CHEST  Small AP window lymph node has an SUV max equal to 2.9. This is compared with 3.0 previously. Resolution of previous index right paraspinal hypermetabolic soft tissue. Moderate size hiatal hernia is again noted.  Changes of mild paraseptal emphysema noted. No hyper metabolic pulmonary nodules identified.  ABDOMEN/PELVIS:  No abnormal uptake identified within the liver, spleen, pancreas or adrenal glands. There has been interval resolution of index left periaortic lymph nodes at the level of the inferior pole of kidneys. Index mesenteric adenopathy below the level of the pancreatic head measures 4 mm in short axis and has an SUV max equal to 1.7. Previously 1.1 cm and SUV max of 6.8. The index lymph node adjacent to the hiatal hernia at the hiatus measures 6 mm short axis and has an SUV max equal to 1.3. Previously this measured 1.3 cm and had an SUV max equal to 5.1. The index right external iliac lymph node measures 6 mm and has an SUV max equal 0.95. Previously this measured 2.6 cm and had an SUV max of 10.7.  SKELETON  Index increased uptake within the right second rib has resolved in the interval. Right sixth rib lesion stress set hyper metabolism associated with right 6 rib lesion has also resolved in the interval. On today's study there is uniform distribution of the radiopharmaceutical throughout the axial and appendicular skeleton.  IMPRESSION:  1. Interval response to  therapy. 2. There has been resolution of hypermetabolism associated with previous index lesions within the chest abdomen and pelvis. 3. No new or progressive disease identified. 4. A uniform distribution of the radiopharmaceutical is now identified throughout the axial and appendicular skeleton which may recent plaque hyper stimulated bone marrow. This may diminished sensitivity for detecting bone marrow involvement by lymphoma. However, no residual areas of asymmetric increased uptake identified to suggest bone involvement by lymphoma.   Electronically Signed   By: Kerby Moors M.D.   On: 09/24/2013 11:22     ASSESSMENT & PLAN:  Low grade B-cell lymphoma The patient has complete response to treatment. Plan would be to give him total of 6 cycles of chemotherapy. We will proceed with treatment today without dose adjustment.   No orders of the defined types were placed in this encounter.   All questions were answered. The patient knows to call the clinic with any problems, questions or concerns. No barriers to learning was detected. I spent 25 minutes counseling the patient face to face. The total time spent in the appointment was 30 minutes and more than 50% was on counseling and review of test results     [facility], [place], MD 09/26/2013 9:16 AM

## 2013-09-26 NOTE — Assessment & Plan Note (Signed)
The patient has complete response to treatment. Plan would be to give him total of 6 cycles of chemotherapy. We will proceed with treatment today without dose adjustment.

## 2013-09-26 NOTE — Patient Instructions (Signed)
[facility] Discharge Instructions for Patients Receiving Chemotherapy   Today you received the following chemotherapy agents :  Rituxan,  Treanda.  To help prevent nausea and vomiting after your treatment, we encourage you to take your nausea medication as prescribed by your physician.   If you develop nausea and vomiting that is not controlled by your nausea medication, call the clinic.   BELOW ARE SYMPTOMS THAT SHOULD BE REPORTED IMMEDIATELY:  *FEVER GREATER THAN 100.5 F  *CHILLS WITH OR WITHOUT FEVER  NAUSEA AND VOMITING THAT IS NOT CONTROLLED WITH YOUR NAUSEA MEDICATION  *UNUSUAL SHORTNESS OF BREATH  *UNUSUAL BRUISING OR BLEEDING  TENDERNESS IN MOUTH AND THROAT WITH OR WITHOUT PRESENCE OF ULCERS  *URINARY PROBLEMS  *BOWEL PROBLEMS  UNUSUAL RASH Items with * indicate a potential emergency and should be followed up as soon as possible.  Feel free to call the clinic you have any questions or concerns. The clinic phone number is [phone].

## 2013-09-26 NOTE — Telephone Encounter (Signed)
gv adn printed appt sched and avs for pt for Sept adn OCT....sed added tx.

## 2013-09-27 ENCOUNTER — Ambulatory Visit (HOSPITAL_BASED_OUTPATIENT_CLINIC_OR_DEPARTMENT_OTHER): Payer: Medicare Other

## 2013-09-27 VITALS — BP 130/69 | HR 91 | Temp 97.8°F | Resp 20

## 2013-09-27 DIAGNOSIS — C8510 Unspecified B-cell lymphoma, unspecified site: Secondary | ICD-10-CM

## 2013-09-27 DIAGNOSIS — Z23 Encounter for immunization: Secondary | ICD-10-CM | POA: Diagnosis not present

## 2013-09-27 DIAGNOSIS — Z5111 Encounter for antineoplastic chemotherapy: Secondary | ICD-10-CM

## 2013-09-27 DIAGNOSIS — C8589 Other specified types of non-Hodgkin lymphoma, extranodal and solid organ sites: Secondary | ICD-10-CM

## 2013-09-27 MED ORDER — ONDANSETRON 8 MG/50ML IVPB (CHCC)
8.0000 mg | Freq: Once | INTRAVENOUS | Status: AC
  Administered 2013-09-27: 8 mg via INTRAVENOUS

## 2013-09-27 MED ORDER — DEXAMETHASONE SODIUM PHOSPHATE 10 MG/ML IJ SOLN
INTRAMUSCULAR | Status: AC
  Filled 2013-09-27: qty 1

## 2013-09-27 MED ORDER — SODIUM CHLORIDE 0.9 % IV SOLN
90.0000 mg/m2 | Freq: Once | INTRAVENOUS | Status: AC
  Administered 2013-09-27: 189 mg via INTRAVENOUS
  Filled 2013-09-27: qty 2.1

## 2013-09-27 MED ORDER — SODIUM CHLORIDE 0.9 % IV SOLN
Freq: Once | INTRAVENOUS | Status: AC
  Administered 2013-09-27: 08:00:00 via INTRAVENOUS

## 2013-09-27 MED ORDER — SODIUM CHLORIDE 0.9 % IJ SOLN
10.0000 mL | INTRAMUSCULAR | Status: DC | PRN
  Administered 2013-09-27: 10 mL
  Filled 2013-09-27: qty 10

## 2013-09-27 MED ORDER — HEPARIN SOD (PORK) LOCK FLUSH 100 UNIT/ML IV SOLN
500.0000 [IU] | Freq: Once | INTRAVENOUS | Status: AC | PRN
  Administered 2013-09-27: 500 [IU]
  Filled 2013-09-27: qty 5

## 2013-09-27 MED ORDER — DEXAMETHASONE SODIUM PHOSPHATE 10 MG/ML IJ SOLN
10.0000 mg | Freq: Once | INTRAMUSCULAR | Status: AC
  Administered 2013-09-27: 10 mg via INTRAVENOUS

## 2013-09-27 MED ORDER — ONDANSETRON 8 MG/NS 50 ML IVPB
INTRAVENOUS | Status: AC
  Filled 2013-09-27: qty 8

## 2013-09-27 NOTE — Patient Instructions (Signed)
Bendamustine Injection What is this medicine? BENDAMUSTINE (BEN da MUS teen) is a chemotherapy drug. It is used to treat chronic lymphocytic leukemia and non-Hodgkin lymphoma. This medicine may be used for other purposes; ask your health care provider or pharmacist if you have questions. COMMON BRAND NAME(S): Treanda What should I tell my health care provider before I take this medicine? They need to know if you have any of these conditions: -kidney disease -liver disease -an unusual or allergic reaction to bendamustine, mannitol, other medicines, foods, dyes, or preservatives -pregnant or trying to get pregnant -breast-feeding How should I use this medicine? This medicine is for infusion into a vein. It is given by a health care professional in a hospital or clinic setting. Talk to your pediatrician regarding the use of this medicine in children. Special care may be needed. Overdosage: If you think you have taken too much of this medicine contact a poison control center or emergency room at once. NOTE: This medicine is only for you. Do not share this medicine with others. What if I miss a dose? It is important not to miss your dose. Call your doctor or health care professional if you are unable to keep an appointment. What may interact with this medicine? Do not take this medicine with any of the following medications: -clozapine This medicine may also interact with the following medications: -atazanavir -cimetidine -ciprofloxacin -enoxacin -fluvoxamine -medicines for seizures like carbamazepine and phenobarbital -mexiletine -rifampin -tacrine -thiabendazole -zileuton This list may not describe all possible interactions. Give your health care provider a list of all the medicines, herbs, non-prescription drugs, or dietary supplements you use. Also tell them if you smoke, drink alcohol, or use illegal drugs. Some items may interact with your medicine. What should I watch for while  using this medicine? Your condition will be monitored carefully while you are receiving this medicine. This drug may make you feel generally unwell. This is not uncommon, as chemotherapy can affect healthy cells as well as cancer cells. Report any side effects. Continue your course of treatment even though you feel ill unless your doctor tells you to stop. Call your doctor or health care professional for advice if you get a fever, chills or sore throat, or other symptoms of a cold or flu. Do not treat yourself. This drug decreases your body's ability to fight infections. Try to avoid being around people who are sick. This medicine may increase your risk to bruise or bleed. Call your doctor or health care professional if you notice any unusual bleeding. Be careful brushing and flossing your teeth or using a toothpick because you may get an infection or bleed more easily. If you have any dental work done, tell your dentist you are receiving this medicine. Avoid taking products that contain aspirin, acetaminophen, ibuprofen, naproxen, or ketoprofen unless instructed by your doctor. These medicines may hide a fever. Do not become pregnant while taking this medicine. Women should inform their doctor if they wish to become pregnant or think they might be pregnant. There is a potential for serious side effects to an unborn child. Men should inform their doctors if they wish to father a child. This medicine may lower sperm counts. Talk to your health care professional or pharmacist for more information. Do not breast-feed an infant while taking this medicine. What side effects may I notice from receiving this medicine? Side effects that you should report to your doctor or health care professional as soon as possible: -allergic reactions like skin rash,  itching or hives, swelling of the face, lips, or tongue -low blood counts - this medicine may decrease the number of white blood cells, red blood cells and  platelets. You may be at increased risk for infections and bleeding. -signs of infection - fever or chills, cough, sore throat, pain or difficulty passing urine -signs of decreased platelets or bleeding - bruising, pinpoint red spots on the skin, black, tarry stools, blood in the urine -signs of decreased red blood cells - unusually weak or tired, fainting spells, lightheadedness -trouble passing urine or change in the amount of urine Side effects that usually do not require medical attention (report to your doctor or health care professional if they continue or are bothersome): -diarrhea This list may not describe all possible side effects. Call your doctor for medical advice about side effects. You may report side effects to FDA at 1-800-FDA-1088. Where should I keep my medicine? This drug is given in a hospital or clinic and will not be stored at home. NOTE: This sheet is a summary. It may not cover all possible information. If you have questions about this medicine, talk to your doctor, pharmacist, or health care provider.  2015, Elsevier/Gold Standard. (2011-03-26 14:15:47)

## 2013-09-28 ENCOUNTER — Ambulatory Visit (HOSPITAL_BASED_OUTPATIENT_CLINIC_OR_DEPARTMENT_OTHER): Payer: Medicare Other

## 2013-09-28 VITALS — BP 110/62 | HR 66 | Temp 97.5°F

## 2013-09-28 DIAGNOSIS — C8510 Unspecified B-cell lymphoma, unspecified site: Secondary | ICD-10-CM

## 2013-09-28 DIAGNOSIS — C8589 Other specified types of non-Hodgkin lymphoma, extranodal and solid organ sites: Secondary | ICD-10-CM | POA: Diagnosis not present

## 2013-09-28 DIAGNOSIS — Z5189 Encounter for other specified aftercare: Secondary | ICD-10-CM | POA: Diagnosis not present

## 2013-09-28 MED ORDER — PEGFILGRASTIM INJECTION 6 MG/0.6ML
6.0000 mg | Freq: Once | SUBCUTANEOUS | Status: AC
  Administered 2013-09-28: 6 mg via SUBCUTANEOUS
  Filled 2013-09-28: qty 0.6

## 2013-09-29 ENCOUNTER — Other Ambulatory Visit: Payer: Self-pay | Admitting: Internal Medicine

## 2013-09-30 ENCOUNTER — Other Ambulatory Visit: Payer: Self-pay | Admitting: Family Medicine

## 2013-10-03 NOTE — Telephone Encounter (Signed)
Last seen 03/07/13  B [place]  This med not on EPIC list  Listed is Psychiatric nurse

## 2013-10-04 ENCOUNTER — Other Ambulatory Visit: Payer: Self-pay | Admitting: Internal Medicine

## 2013-10-04 NOTE — Telephone Encounter (Signed)
Left message to confirm colcrys. Pt was to be taking this med for only 3 months.

## 2013-10-08 ENCOUNTER — Other Ambulatory Visit: Payer: Self-pay | Admitting: *Deleted

## 2013-10-08 MED ORDER — DILTIAZEM HCL ER COATED BEADS 120 MG PO CP24: 120.0000 mg | ORAL_CAPSULE | Freq: Every morning | ORAL | Status: DC

## 2013-10-11 ENCOUNTER — Encounter: Payer: Self-pay | Admitting: Cardiovascular Disease

## 2013-10-11 ENCOUNTER — Ambulatory Visit (INDEPENDENT_AMBULATORY_CARE_PROVIDER_SITE_OTHER): Payer: Medicare Other | Admitting: Cardiovascular Disease

## 2013-10-11 VITALS — BP 104/71 | HR 75 | Ht 72.0 in | Wt 191.5 lb

## 2013-10-11 DIAGNOSIS — I319 Disease of pericardium, unspecified: Secondary | ICD-10-CM | POA: Diagnosis not present

## 2013-10-11 DIAGNOSIS — I480 Paroxysmal atrial fibrillation: Secondary | ICD-10-CM | POA: Diagnosis not present

## 2013-10-11 DIAGNOSIS — I2510 Atherosclerotic heart disease of native coronary artery without angina pectoris: Secondary | ICD-10-CM

## 2013-10-11 DIAGNOSIS — R002 Palpitations: Secondary | ICD-10-CM

## 2013-10-11 DIAGNOSIS — I5189 Other ill-defined heart diseases: Secondary | ICD-10-CM

## 2013-10-11 DIAGNOSIS — I519 Heart disease, unspecified: Secondary | ICD-10-CM

## 2013-10-11 MED ORDER — DILTIAZEM HCL ER COATED BEADS 120 MG PO CP24: 120.0000 mg | ORAL_CAPSULE | Freq: Every morning | ORAL | Status: DC

## 2013-10-11 NOTE — Progress Notes (Signed)
Patient ID: Anthony Kelly, male   DOB: 23-Oct-1945, 68 y.o.   MRN: [id number]      SUBJECTIVE: The patient returns for followup and is feeling well overall. He has seldom experiences palpitations, and they primarily occur either after chemotherapy or when he is lying in the left lateral position. He denies chest pain and shortness of breath. He experience some right leg swelling after chemotherapy which lasted 2 days and then resolved.   Review of Systems: As per "subjective", otherwise negative.  No Known Allergies  Current Outpatient Prescriptions  Medication Sig Dispense Refill  . aspirin EC 81 MG tablet Take 81 mg by mouth every morning.      Marland Kitchen atorvastatin (LIPITOR) 10 MG tablet Take 5 mg by mouth daily. Takes half tablet daily      . carboxymethylcellulose (REFRESH PLUS) 0.5 % SOLN 1 drop 3 (three) times daily as needed.      . diltiazem (CARDIZEM CD) 120 MG 24 hr capsule Take 1 capsule (120 mg total) by mouth every morning.  90 capsule  3  . ibuprofen (ADVIL,MOTRIN) 800 MG tablet Take 1 tablet (800 mg total) by mouth every 8 (eight) hours as needed.  30 tablet  0  . lidocaine-prilocaine (EMLA) cream Apply 1 application topically as needed.  30 g  0  . loratadine (CLARITIN) 10 MG tablet Take 10 mg by mouth daily.      . Menthol-Methyl Salicylate (MUSCLE RUB) 10-15 % CREA Apply 1 application topically as needed for muscle pain.      . metoprolol succinate (TOPROL-XL) 25 MG 24 hr tablet Take 25 mg by mouth every morning.      . Multiple Vitamin (MULTIVITAMIN WITH MINERALS) TABS tablet Take 1 tablet by mouth daily.      . ondansetron (ZOFRAN) 8 MG tablet Take 1 tablet (8 mg total) by mouth every 8 (eight) hours as needed for nausea.  30 tablet  3  . Polyethyl Glycol-Propyl Glycol (SYSTANE) 0.4-0.3 % GEL Apply to eye.      . promethazine (PHENERGAN) 25 MG tablet Take 1 tablet (25 mg total) by mouth every 6 (six) hours as needed for nausea.  30 tablet  3  . psyllium (METAMUCIL) 58.6 %  packet Take 1 packet by mouth daily.       No current facility-administered medications for this visit.    Past Medical History  Diagnosis Date  . PSVT (paroxysmal supraventricular tachycardia)   . Arthritis   . 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 Metastasis to lymph nodes dx'd 03/2011    lt submandibular ln  . Atrial fibrillation     a. Isolated episode in the setting of acute pericarditis 04/2013. Was not placed on anticoag.  Marland Kitchen Acute pericarditis     a. 04/2013 -adm with CP, elevated CRP. H/o coronary artery calcification on prior CT but nuc was negative, EF 71%.  . Pain in joint, pelvic region and thigh 08/01/2013  . Diverticulitis 08/16/2013    Past Surgical History  Procedure Laterality Date  . Lung lobectomy      right side  . Cholecystectomy    . Lymph node biopsy      in groin with  removal  . Meniscus repair      right knee  . Vein ligation and stripping      right leg  . Tonsillectomy      as a child  . Submandibular gland excision  04/2011  . Submandibular gland excision  04/30/2011    Procedure: EXCISION SUBMANDIBULAR GLAND;  Surgeon: Jerrell Belfast, MD;  Location: [facility];  Service: ENT;  Laterality: Left;  WITH DIAGNOSTIC BIOPSY  . Exploratory laparotomy      History   Social History  . Marital Status: Married    Spouse Name: N/A    Number of Children: N/A  . Years of Education: N/A   Occupational History  . Not on file.   Social History Main Topics  . Smoking status: Former Smoker -- 1.50 packs/day for 25 years    Quit date: 01/11/1990  . Smokeless tobacco: Never Used  . Alcohol Use: No  . Drug Use: No  . Sexual Activity: Not Currently   Other Topics Concern    . Not on file   Social History Narrative  . No narrative on file     Filed Vitals:   10/11/13 0819  BP: 104/71  Pulse: 75  Height: 6' (1.829 m)  Weight: 191 lb 8 oz (86.864 kg)  SpO2: 96%    PHYSICAL EXAM General: NAD HEENT: Normal. Neck: No JVD, no thyromegaly. Lungs: Clear to auscultation bilaterally with normal respiratory effort. CV: Nondisplaced PMI.  Regular rate and rhythm, normal S1/S2, no S3/S4, no murmur. No pretibial or periankle edema.  No carotid bruit.  Normal pedal pulses.  Abdomen: Soft, nontender, no hepatosplenomegaly, no distention.  Neurologic: Alert and oriented x 3.  Psych: Normal affect. Skin: Normal. Musculoskeletal: Normal range of motion, no gross deformities. Extremities: No clubbing or cyanosis.   ECG: Most recent ECG reviewed.      ASSESSMENT AND PLAN: 1. Pericarditis: Has completed colchicine with no recurrence of symptoms. 2. PSVT/PAF: Has notable paroxysms but markedly diminished in frequency. Unable to wear a monitor. Continue long-acting diltiazem and long-acting metoprolol.   3. Coronary artery calcifications on CT: Normal nuclear MPI study, thus no further testing is indicated at this time.  4. Grade II diastolic dysfunction: No evidence of heart failure.   Dispo: f/u 6 months.   Kate Sable, M.D., F.A.C.C.

## 2013-10-11 NOTE — Patient Instructions (Signed)
Continue all current medications. Your physician wants you to follow up in: 6 months.  You will receive a reminder letter in the mail one-two months in advance.  If you don't receive a letter, please call our office to schedule the follow up appointment   

## 2013-10-24 ENCOUNTER — Telehealth: Payer: Self-pay | Admitting: Hematology and Oncology

## 2013-10-24 ENCOUNTER — Encounter: Payer: Self-pay | Admitting: Hematology and Oncology

## 2013-10-24 ENCOUNTER — Ambulatory Visit: Payer: Medicare Other

## 2013-10-24 ENCOUNTER — Ambulatory Visit (HOSPITAL_BASED_OUTPATIENT_CLINIC_OR_DEPARTMENT_OTHER): Payer: Medicare Other

## 2013-10-24 ENCOUNTER — Other Ambulatory Visit (HOSPITAL_BASED_OUTPATIENT_CLINIC_OR_DEPARTMENT_OTHER): Payer: Medicare Other

## 2013-10-24 ENCOUNTER — Ambulatory Visit (HOSPITAL_BASED_OUTPATIENT_CLINIC_OR_DEPARTMENT_OTHER): Payer: Medicare Other | Admitting: Hematology and Oncology

## 2013-10-24 VITALS — BP 115/70 | HR 63 | Temp 97.7°F | Resp 18

## 2013-10-24 VITALS — BP 113/74 | HR 78 | Temp 97.8°F | Resp 18 | Ht 72.0 in | Wt 194.4 lb

## 2013-10-24 DIAGNOSIS — Z5111 Encounter for antineoplastic chemotherapy: Secondary | ICD-10-CM

## 2013-10-24 DIAGNOSIS — Z5112 Encounter for antineoplastic immunotherapy: Secondary | ICD-10-CM | POA: Diagnosis not present

## 2013-10-24 DIAGNOSIS — K5909 Other constipation: Secondary | ICD-10-CM

## 2013-10-24 DIAGNOSIS — Z95828 Presence of other vascular implants and grafts: Secondary | ICD-10-CM

## 2013-10-24 DIAGNOSIS — K219 Gastro-esophageal reflux disease without esophagitis: Secondary | ICD-10-CM | POA: Insufficient documentation

## 2013-10-24 DIAGNOSIS — C8510 Unspecified B-cell lymphoma, unspecified site: Secondary | ICD-10-CM

## 2013-10-24 DIAGNOSIS — M25559 Pain in unspecified hip: Secondary | ICD-10-CM

## 2013-10-24 DIAGNOSIS — K5900 Constipation, unspecified: Secondary | ICD-10-CM | POA: Insufficient documentation

## 2013-10-24 DIAGNOSIS — R12 Heartburn: Secondary | ICD-10-CM | POA: Diagnosis not present

## 2013-10-24 LAB — COMPREHENSIVE METABOLIC PANEL (CC13)
ALBUMIN: 3.6 g/dL (ref 3.5–5.0)
ALT: 23 U/L (ref 0–55)
ANION GAP: 8 meq/L (ref 3–11)
AST: 21 U/L (ref 5–34)
Alkaline Phosphatase: 60 U/L (ref 40–150)
BUN: 12.2 mg/dL (ref 7.0–26.0)
CALCIUM: 9.3 mg/dL (ref 8.4–10.4)
CHLORIDE: 109 meq/L (ref 98–109)
CO2: 24 meq/L (ref 22–29)
Creatinine: 1 mg/dL (ref 0.7–1.3)
Glucose: 102 mg/dl (ref 70–140)
POTASSIUM: 4.1 meq/L (ref 3.5–5.1)
SODIUM: 141 meq/L (ref 136–145)
TOTAL PROTEIN: 6.1 g/dL — AB (ref 6.4–8.3)
Total Bilirubin: 0.68 mg/dL (ref 0.20–1.20)

## 2013-10-24 LAB — CBC WITH DIFFERENTIAL/PLATELET
BASO%: 0.3 % (ref 0.0–2.0)
Basophils Absolute: 0 10*3/uL (ref 0.0–0.1)
EOS%: 6.1 % (ref 0.0–7.0)
Eosinophils Absolute: 0.4 10*3/uL (ref 0.0–0.5)
HCT: 39.5 % (ref 38.4–49.9)
HGB: 13.7 g/dL (ref 13.0–17.1)
LYMPH#: 1 10*3/uL (ref 0.9–3.3)
LYMPH%: 16.2 % (ref 14.0–49.0)
MCH: 30.6 pg (ref 27.2–33.4)
MCHC: 34.7 g/dL (ref 32.0–36.0)
MCV: 88.2 fL (ref 79.3–98.0)
MONO#: 0.8 10*3/uL (ref 0.1–0.9)
MONO%: 13 % (ref 0.0–14.0)
NEUT#: 3.8 10*3/uL (ref 1.5–6.5)
NEUT%: 64.4 % (ref 39.0–75.0)
Platelets: 141 10*3/uL (ref 140–400)
RBC: 4.48 10*6/uL (ref 4.20–5.82)
RDW: 13.7 % (ref 11.0–14.6)
WBC: 5.9 10*3/uL (ref 4.0–10.3)
nRBC: 0 % (ref 0–0)

## 2013-10-24 MED ORDER — DEXAMETHASONE SODIUM PHOSPHATE 10 MG/ML IJ SOLN
INTRAMUSCULAR | Status: AC
  Filled 2013-10-24: qty 1

## 2013-10-24 MED ORDER — DIPHENHYDRAMINE HCL 25 MG PO CAPS
50.0000 mg | ORAL_CAPSULE | Freq: Once | ORAL | Status: AC
  Administered 2013-10-24: 50 mg via ORAL

## 2013-10-24 MED ORDER — DEXAMETHASONE SODIUM PHOSPHATE 10 MG/ML IJ SOLN
10.0000 mg | Freq: Once | INTRAMUSCULAR | Status: AC
  Administered 2013-10-24: 10 mg via INTRAVENOUS

## 2013-10-24 MED ORDER — SODIUM CHLORIDE 0.9 % IV SOLN
375.0000 mg/m2 | Freq: Once | INTRAVENOUS | Status: AC
  Administered 2013-10-24: 800 mg via INTRAVENOUS
  Filled 2013-10-24: qty 80

## 2013-10-24 MED ORDER — ONDANSETRON 8 MG/NS 50 ML IVPB
INTRAVENOUS | Status: AC
  Filled 2013-10-24: qty 8

## 2013-10-24 MED ORDER — HEPARIN SOD (PORK) LOCK FLUSH 100 UNIT/ML IV SOLN
500.0000 [IU] | Freq: Once | INTRAVENOUS | Status: AC | PRN
  Administered 2013-10-24: 500 [IU]
  Filled 2013-10-24: qty 5

## 2013-10-24 MED ORDER — SODIUM CHLORIDE 0.9 % IJ SOLN
10.0000 mL | INTRAMUSCULAR | Status: DC | PRN
  Administered 2013-10-24 (×2): 10 mL via INTRAVENOUS
  Filled 2013-10-24: qty 10

## 2013-10-24 MED ORDER — ACETAMINOPHEN 325 MG PO TABS
650.0000 mg | ORAL_TABLET | Freq: Once | ORAL | Status: AC
  Administered 2013-10-24: 650 mg via ORAL

## 2013-10-24 MED ORDER — DIPHENHYDRAMINE HCL 25 MG PO CAPS
ORAL_CAPSULE | ORAL | Status: AC
  Filled 2013-10-24: qty 2

## 2013-10-24 MED ORDER — SODIUM CHLORIDE 0.9 % IV SOLN
90.0000 mg/m2 | Freq: Once | INTRAVENOUS | Status: AC
  Administered 2013-10-24: 189 mg via INTRAVENOUS
  Filled 2013-10-24: qty 2.1

## 2013-10-24 MED ORDER — SODIUM CHLORIDE 0.9 % IJ SOLN
10.0000 mL | INTRAMUSCULAR | Status: DC | PRN
  Filled 2013-10-24: qty 10

## 2013-10-24 MED ORDER — SODIUM CHLORIDE 0.9 % IV SOLN
Freq: Once | INTRAVENOUS | Status: AC
  Administered 2013-10-24: 10:00:00 via INTRAVENOUS

## 2013-10-24 MED ORDER — ACETAMINOPHEN 325 MG PO TABS
ORAL_TABLET | ORAL | Status: AC
  Filled 2013-10-24: qty 2

## 2013-10-24 MED ORDER — ONDANSETRON 8 MG/50ML IVPB (CHCC)
8.0000 mg | Freq: Once | INTRAVENOUS | Status: AC
  Administered 2013-10-24: 8 mg via INTRAVENOUS

## 2013-10-24 NOTE — Progress Notes (Signed)
Doc flowsheet and MAR was done in infusion charting at [facility].

## 2013-10-24 NOTE — Patient Instructions (Signed)

## 2013-10-24 NOTE — Assessment & Plan Note (Signed)
I recommend over-the-counter laxatives as needed.

## 2013-10-24 NOTE — Assessment & Plan Note (Signed)
He was taking ibuprofen regularly after Neulasta injection and that has caused significant leg edema. We would discontinue Neulasta from now on.

## 2013-10-24 NOTE — Patient Instructions (Signed)
[facility] Discharge Instructions for Patients Receiving Chemotherapy  Today you received the following chemotherapy agents Rituxan and Treanda. To help prevent nausea and vomiting after your treatment, we encourage you to take your nausea medication as prescribed.   If you develop nausea and vomiting that is not controlled by your nausea medication, call the clinic.   BELOW ARE SYMPTOMS THAT SHOULD BE REPORTED IMMEDIATELY:  *FEVER GREATER THAN 100.5 F  *CHILLS WITH OR WITHOUT FEVER  NAUSEA AND VOMITING THAT IS NOT CONTROLLED WITH YOUR NAUSEA MEDICATION  *UNUSUAL SHORTNESS OF BREATH  *UNUSUAL BRUISING OR BLEEDING  TENDERNESS IN MOUTH AND THROAT WITH OR WITHOUT PRESENCE OF ULCERS  *URINARY PROBLEMS  *BOWEL PROBLEMS  UNUSUAL RASH Items with * indicate a potential emergency and should be followed up as soon as possible.  Feel free to call the clinic you have any questions or concerns. The clinic phone number is [phone].

## 2013-10-24 NOTE — Assessment & Plan Note (Signed)
I recommend Zantac daily and tums as needed for heartburn.

## 2013-10-24 NOTE — Assessment & Plan Note (Signed)
Overall, he tolerated chemotherapy well apart from minor, expected side effects. Most of the side effects attributed to the use of Neulasta. He denies prior history of infection and neutropenic fever while on this therapy. We discussed the risks, benefits, side effects of Neulasta and agreed to abandon further future injections.

## 2013-10-24 NOTE — Progress Notes (Signed)
. 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   12/12/2003 - 06/01/2004 Chemotherapy He was treated with R. CHOP chemotherapy which show complete remission. The number of cycles of R. CHOP chemotherapy was unknown.   12/19/2006 Surgery Lung resection show follicular lymphoma.   01/02/2007 - 09/01/2008 Chemotherapy The patient was treated with single agent rituximab alone.   01/19/2007 Bone Marrow Biopsy Bone marrow biopsy was negative.   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   05/08/2013 - 05/11/2013 Hospital Admission The patient was admitted to the hospital for management of pericarditis. CT scan showed extensive lymphadenopathy.   06/07/2013 Imaging PET/CT scan showed extensive lymphadenopathy   06/25/2013 Procedure He has placement of Infuse-a-Port.   06/28/2013 Bone Marrow Biopsy Bone marrow biopsy is positive for lymphoma involvement.   07/04/2013 -  Chemotherapy He is treated with cycle 1 of bendamustine with rituximab.   09/24/2013 Imaging Repeat PET scan show complete remission.     INTERVAL HISTORY: Please see below for problem oriented charting. He is prior to cycle 5 of chemotherapy. He complained of bone pain, leg swelling, heartburn and constipation within the first few days after chemotherapy. Denies recent infection.  REVIEW OF SYSTEMS:   Constitutional: Denies fevers, chills or abnormal weight loss Eyes: Denies blurriness of vision Ears, nose, mouth, throat, and  face: Denies mucositis or sore throat Respiratory: Denies cough, dyspnea or wheezes Cardiovascular: Denies palpitation, chest discomfort  Skin: Denies abnormal skin rashes Lymphatics: Denies new lymphadenopathy or easy bruising Neurological:Denies numbness, tingling or new weaknesses Behavioral/Psych: Mood is stable, no new changes  All other systems were reviewed with the patient and are negative.  I have reviewed the past medical history, past surgical history, social history and family history with the patient and they are unchanged from previous note.  ALLERGIES:  has No Known Allergies.  MEDICATIONS:  Current Outpatient Prescriptions  Medication Sig Dispense Refill  . aspirin EC 81 MG tablet Take 81 mg by mouth every morning.      Marland Kitchen atorvastatin (LIPITOR) 10 MG tablet Take 5 mg by mouth daily. Takes half tablet daily      . carboxymethylcellulose (REFRESH PLUS) 0.5 % SOLN 1 drop 3 (three) times daily as needed.      . diltiazem (CARDIZEM CD) 120 MG 24 hr capsule Take 1 capsule (120 mg total) by mouth every morning.  90 capsule  3  . ibuprofen (ADVIL,MOTRIN) 800 MG tablet Take 1 tablet (800 mg total) by mouth every 8 (eight) hours as needed.  30 tablet  0  . lidocaine-prilocaine (EMLA) cream Apply 1 application topically as needed.  30 g  0  . loratadine (CLARITIN) 10 MG tablet Take 10 mg by mouth daily.      . Menthol-Methyl Salicylate (MUSCLE RUB) 10-15 % CREA Apply 1 application topically as needed for muscle pain.      . metoprolol succinate (TOPROL-XL) 25 MG 24 hr tablet Take 25 mg by mouth every morning.      Marland Kitchen  Multiple Vitamin (MULTIVITAMIN WITH MINERALS) TABS tablet Take 1 tablet by mouth daily.      . ondansetron (ZOFRAN) 8 MG tablet Take 1 tablet (8 mg total) by mouth every 8 (eight) hours as needed for nausea.  30 tablet  3  . Polyethyl Glycol-Propyl Glycol (SYSTANE) 0.4-0.3 % GEL Apply to eye.      . promethazine (PHENERGAN) 25 MG tablet Take 1 tablet (25 mg total) by  mouth every 6 (six) hours as needed for nausea.  30 tablet  3  . psyllium (METAMUCIL) 58.6 % packet Take 1 packet by mouth daily.       No current facility-administered medications for this visit.   Facility-Administered Medications Ordered in Other Visits  Medication Dose Route Frequency Provider Last Rate Last Dose  . sodium chloride 0.9 % injection 10 mL  10 mL Intravenous PRN [name], MD   10 mL at 10/24/13 0839    PHYSICAL EXAMINATION: ECOG PERFORMANCE STATUS: 0 - Asymptomatic  Filed Vitals:   10/24/13 0849  BP: 113/74  Pulse: 78  Temp: 97.8 F (36.6 C)  Resp: 18   Filed Weights   10/24/13 0849  Weight: 194 lb 6.4 oz (88.179 kg)    GENERAL:alert, no distress and comfortable SKIN: skin color, texture, turgor are normal, no rashes or significant lesions EYES: normal, Conjunctiva are pink and non-injected, sclera clear OROPHARYNX:no exudate, no erythema and lips, buccal mucosa, and tongue normal  NECK: supple, thyroid normal size, non-tender, without nodularity LYMPH:  no palpable lymphadenopathy in the cervical, axillary or inguinal LUNGS: clear to auscultation and percussion with normal breathing effort HEART: regular rate & rhythm and no murmurs and no lower extremity edema ABDOMEN:abdomen soft, non-tender and normal bowel sounds Musculoskeletal:no cyanosis of digits and no clubbing  NEURO: alert & oriented x 3 with fluent speech, no focal motor/sensory deficits  LABORATORY DATA:  I have reviewed the data as listed    Component Value Date/Time   NA 141 10/24/2013 0803   NA 140 06/28/2013 0705   NA 139 07/20/2011 0800   K 4.1 10/24/2013 0803   K 3.9 06/28/2013 0705   K 4.4 07/20/2011 0800   CL 103 06/28/2013 0705   CL 104 03/24/2012 1024   CL 100 07/20/2011 0800   CO2 24 10/24/2013 0803   CO2 24 06/28/2013 0705   CO2 29 07/20/2011 0800   GLUCOSE 102 10/24/2013 0803   GLUCOSE 111* 06/28/2013 0705   GLUCOSE 80 03/24/2012 1024   GLUCOSE 100 07/20/2011 0800   BUN 12.2  10/24/2013 0803   BUN 19 06/28/2013 0705   BUN 14 07/20/2011 0800   CREATININE 1.0 10/24/2013 0803   CREATININE 1.06 06/28/2013 0705   CREATININE 1.2 07/20/2011 0800   CALCIUM 9.3 10/24/2013 0803   CALCIUM 9.0 06/28/2013 0705   CALCIUM 8.7 07/20/2011 0800   PROT 6.1* 10/24/2013 0803   PROT 6.5 06/28/2013 0705   PROT 7.2 07/20/2011 0800   ALBUMIN 3.6 10/24/2013 0803   ALBUMIN 3.8 06/28/2013 0705   AST 21 10/24/2013 0803   AST 23 06/28/2013 0705   AST 25 07/20/2011 0800   ALT 23 10/24/2013 0803   ALT 26 06/28/2013 0705   ALT 29 07/20/2011 0800   ALKPHOS 60 10/24/2013 0803   ALKPHOS 64 06/28/2013 0705   ALKPHOS 55 07/20/2011 0800   BILITOT 0.68 10/24/2013 0803   BILITOT 0.6 06/28/2013 0705   BILITOT 1.00 07/20/2011 0800   GFRNONAA 70* 06/28/2013 0705   GFRAA 81* 06/28/2013 0705  No results found for this basename: SPEP, UPEP,  kappa and lambda light chains    Lab Results  Component Value Date   WBC 5.9 10/24/2013   NEUTROABS 3.8 10/24/2013   HGB 13.7 10/24/2013   HCT 39.5 10/24/2013   MCV 88.2 10/24/2013   PLT 141 10/24/2013      Chemistry      Component Value Date/Time   NA 141 10/24/2013 0803   NA 140 06/28/2013 0705   NA 139 07/20/2011 0800   K 4.1 10/24/2013 0803   K 3.9 06/28/2013 0705   K 4.4 07/20/2011 0800   CL 103 06/28/2013 0705   CL 104 03/24/2012 1024   CL 100 07/20/2011 0800   CO2 24 10/24/2013 0803   CO2 24 06/28/2013 0705   CO2 29 07/20/2011 0800   BUN 12.2 10/24/2013 0803   BUN 19 06/28/2013 0705   BUN 14 07/20/2011 0800   CREATININE 1.0 10/24/2013 0803   CREATININE 1.06 06/28/2013 0705   CREATININE 1.2 07/20/2011 0800      Component Value Date/Time   CALCIUM 9.3 10/24/2013 0803   CALCIUM 9.0 06/28/2013 0705   CALCIUM 8.7 07/20/2011 0800   ALKPHOS 60 10/24/2013 0803   ALKPHOS 64 06/28/2013 0705   ALKPHOS 55 07/20/2011 0800   AST 21 10/24/2013 0803   AST 23 06/28/2013 0705   AST 25 07/20/2011 0800   ALT 23 10/24/2013 0803   ALT 26 06/28/2013 0705   ALT 29 07/20/2011 0800   BILITOT  0.68 10/24/2013 0803   BILITOT 0.6 06/28/2013 0705   BILITOT 1.00 07/20/2011 0800       RADIOGRAPHIC STUDIES: I reviewed the most recent PET/CT scan with him and his wife. I have personally reviewed the radiological images as listed and agreed with the findings in the report.  ASSESSMENT & PLAN:  Low grade B-cell lymphoma Overall, he tolerated chemotherapy well apart from minor, expected side effects. Most of the side effects attributed to the use of Neulasta. He denies prior history of infection and neutropenic fever while on this therapy. We discussed the risks, benefits, side effects of Neulasta and agreed to abandon further future injections.  Pain in joint, pelvic region and thigh He was taking ibuprofen regularly after Neulasta injection and that has caused significant leg edema. We would discontinue Neulasta from now on.  Heartburn I recommend Zantac daily and tums as needed for heartburn.  Constipation I recommend over-the-counter laxatives as needed.   No orders of the defined types were placed in this encounter.   All questions were answered. The patient knows to call the clinic with any problems, questions or concerns. No barriers to learning was detected. I spent 30 minutes counseling the patient face to face. The total time spent in the appointment was 40 minutes and more than 50% was on counseling and review of test results     [facility], Mirra Basilio, MD 10/24/2013 9:22 AM

## 2013-10-24 NOTE — Telephone Encounter (Signed)
gv adn printed appt sched adn avs for pt for OCT and NOV...sed added tx.

## 2013-10-25 ENCOUNTER — Ambulatory Visit (HOSPITAL_BASED_OUTPATIENT_CLINIC_OR_DEPARTMENT_OTHER): Payer: Medicare Other

## 2013-10-25 VITALS — BP 109/69 | HR 93 | Temp 98.5°F

## 2013-10-25 DIAGNOSIS — Z5111 Encounter for antineoplastic chemotherapy: Secondary | ICD-10-CM

## 2013-10-25 DIAGNOSIS — C8510 Unspecified B-cell lymphoma, unspecified site: Secondary | ICD-10-CM

## 2013-10-25 MED ORDER — DEXAMETHASONE SODIUM PHOSPHATE 10 MG/ML IJ SOLN
INTRAMUSCULAR | Status: AC
  Filled 2013-10-25: qty 1

## 2013-10-25 MED ORDER — SODIUM CHLORIDE 0.9 % IV SOLN
90.0000 mg/m2 | Freq: Once | INTRAVENOUS | Status: AC
  Administered 2013-10-25: 189 mg via INTRAVENOUS
  Filled 2013-10-25: qty 2.1

## 2013-10-25 MED ORDER — ONDANSETRON 8 MG/50ML IVPB (CHCC)
8.0000 mg | Freq: Once | INTRAVENOUS | Status: AC
  Administered 2013-10-25: 8 mg via INTRAVENOUS

## 2013-10-25 MED ORDER — DEXAMETHASONE SODIUM PHOSPHATE 10 MG/ML IJ SOLN
10.0000 mg | Freq: Once | INTRAMUSCULAR | Status: AC
  Administered 2013-10-25: 10 mg via INTRAVENOUS

## 2013-10-25 MED ORDER — HEPARIN SOD (PORK) LOCK FLUSH 100 UNIT/ML IV SOLN
500.0000 [IU] | Freq: Once | INTRAVENOUS | Status: AC | PRN
  Administered 2013-10-25: 500 [IU]
  Filled 2013-10-25: qty 5

## 2013-10-25 MED ORDER — ONDANSETRON 8 MG/NS 50 ML IVPB
INTRAVENOUS | Status: AC
  Filled 2013-10-25: qty 8

## 2013-10-25 MED ORDER — SODIUM CHLORIDE 0.9 % IV SOLN
Freq: Once | INTRAVENOUS | Status: AC
  Administered 2013-10-25: 08:00:00 via INTRAVENOUS

## 2013-10-25 MED ORDER — SODIUM CHLORIDE 0.9 % IJ SOLN
10.0000 mL | INTRAMUSCULAR | Status: DC | PRN
  Administered 2013-10-25: 10 mL
  Filled 2013-10-25: qty 10

## 2013-10-25 NOTE — Patient Instructions (Signed)
[facility] Discharge Instructions for Patients Receiving Chemotherapy  Today you received the following chemotherapy agents: Treanda To help prevent nausea and vomiting after your treatment, we encourage you to take your nausea medication: Zofran 8 mg every 8 hours as needed; Promethazine 25mg  every 6 hours as needed.   If you develop nausea and vomiting that is not controlled by your nausea medication, call the clinic.   BELOW ARE SYMPTOMS THAT SHOULD BE REPORTED IMMEDIATELY:  *FEVER GREATER THAN 100.5 F  *CHILLS WITH OR WITHOUT FEVER  NAUSEA AND VOMITING THAT IS NOT CONTROLLED WITH YOUR NAUSEA MEDICATION  *UNUSUAL SHORTNESS OF BREATH  *UNUSUAL BRUISING OR BLEEDING  TENDERNESS IN MOUTH AND THROAT WITH OR WITHOUT PRESENCE OF ULCERS  *URINARY PROBLEMS  *BOWEL PROBLEMS  UNUSUAL RASH Items with * indicate a potential emergency and should be followed up as soon as possible.  Feel free to call the clinic you have any questions or concerns. The clinic phone number is [phone].

## 2013-10-26 ENCOUNTER — Ambulatory Visit: Payer: Medicare Other

## 2013-11-05 ENCOUNTER — Telehealth: Payer: Self-pay | Admitting: *Deleted

## 2013-11-05 ENCOUNTER — Other Ambulatory Visit: Payer: Self-pay | Admitting: Hematology and Oncology

## 2013-11-05 ENCOUNTER — Encounter: Payer: Self-pay | Admitting: Hematology and Oncology

## 2013-11-05 DIAGNOSIS — C8510 Unspecified B-cell lymphoma, unspecified site: Secondary | ICD-10-CM

## 2013-11-05 MED ORDER — OXYCODONE HCL 5 MG PO TABS: 5.0000 mg | ORAL_TABLET | ORAL | Status: DC | PRN

## 2013-11-05 NOTE — Telephone Encounter (Signed)
Spoke with wife [name].  Informed [name] re:  Per Dr. Alvy Bimler : 1.   D/C  Motrin  (  Per [name],  Pt has not been taking this med in a while ). 2.   Prilosec  40mg   Daily  [name] stated she has this med at home -  No need for nurse to call in to pharmacy ).  [name] will give to pt. 3.   Prescription for  Oxycodone 5mg  is ready for [name] to pick up.   [name] stated she will pick up prescription on Tues  11/06/13.  ) Norvel Richards to call office early am on Wed. 11/07/13  If symptoms not resolved with meds  So Dr. Alvy Bimler can work pt in for office visit.   [name] voiced understanding.

## 2013-11-05 NOTE — Telephone Encounter (Signed)
Reviewed wife [name] email message from [place].  Spoke with [name], and was informed that pt has had increased upper abdominal pain - cramping since last Friday night.  [name] gave pt Naproxen 500mg  with very little relief.  [name] stated pt has been drinking fluids as tolerted but not as much as pt should; pt's appetite is fine.  Pt has been taking mild laxative with good results;  Bladder functions fine.  [name] stated Dr. Alvy Bimler was aware of abdominal cramping before, but it is worse now ( pain was in lower abdominal before, but now in upper abdominal area ).  Pt was nauseated this am, took Zofran with relief. Asked [name] if she thinks pt needed IVF;  [name] stated "  I don't think so ".   [name] would like to request some pain meds for pt to help with abdominal cramping;  Wife also would like for pt to receive Neulasta post chemo with next cycle. Message to Dr. Alvy Bimler for review. [facility]  Phone    [phone].

## 2013-11-20 DIAGNOSIS — H04123 Dry eye syndrome of bilateral lacrimal glands: Secondary | ICD-10-CM | POA: Diagnosis not present

## 2013-11-21 ENCOUNTER — Ambulatory Visit: Payer: Medicare Other

## 2013-11-21 ENCOUNTER — Telehealth: Payer: Self-pay | Admitting: Hematology and Oncology

## 2013-11-21 ENCOUNTER — Ambulatory Visit (HOSPITAL_BASED_OUTPATIENT_CLINIC_OR_DEPARTMENT_OTHER): Payer: Medicare Other | Admitting: Hematology and Oncology

## 2013-11-21 ENCOUNTER — Ambulatory Visit (HOSPITAL_BASED_OUTPATIENT_CLINIC_OR_DEPARTMENT_OTHER): Payer: Medicare Other

## 2013-11-21 ENCOUNTER — Encounter: Payer: Self-pay | Admitting: Hematology and Oncology

## 2013-11-21 ENCOUNTER — Other Ambulatory Visit (HOSPITAL_BASED_OUTPATIENT_CLINIC_OR_DEPARTMENT_OTHER): Payer: Medicare Other

## 2013-11-21 VITALS — BP 113/69 | HR 57 | Temp 98.4°F | Resp 18 | Ht 72.0 in | Wt 194.4 lb

## 2013-11-21 DIAGNOSIS — K5909 Other constipation: Secondary | ICD-10-CM | POA: Diagnosis not present

## 2013-11-21 DIAGNOSIS — C8510 Unspecified B-cell lymphoma, unspecified site: Secondary | ICD-10-CM

## 2013-11-21 DIAGNOSIS — Z5111 Encounter for antineoplastic chemotherapy: Secondary | ICD-10-CM

## 2013-11-21 DIAGNOSIS — R12 Heartburn: Secondary | ICD-10-CM | POA: Diagnosis not present

## 2013-11-21 DIAGNOSIS — R110 Nausea: Secondary | ICD-10-CM | POA: Diagnosis not present

## 2013-11-21 DIAGNOSIS — Z5112 Encounter for antineoplastic immunotherapy: Secondary | ICD-10-CM | POA: Diagnosis not present

## 2013-11-21 DIAGNOSIS — Z95828 Presence of other vascular implants and grafts: Secondary | ICD-10-CM

## 2013-11-21 LAB — CBC WITH DIFFERENTIAL/PLATELET
BASO%: 0.6 % (ref 0.0–2.0)
Basophils Absolute: 0 10*3/uL (ref 0.0–0.1)
EOS%: 5.2 % (ref 0.0–7.0)
Eosinophils Absolute: 0.4 10*3/uL (ref 0.0–0.5)
HCT: 41.1 % (ref 38.4–49.9)
HGB: 14 g/dL (ref 13.0–17.1)
LYMPH#: 1.4 10*3/uL (ref 0.9–3.3)
LYMPH%: 20 % (ref 14.0–49.0)
MCH: 30.1 pg (ref 27.2–33.4)
MCHC: 34 g/dL (ref 32.0–36.0)
MCV: 88.4 fL (ref 79.3–98.0)
MONO#: 0.9 10*3/uL (ref 0.1–0.9)
MONO%: 13.1 % (ref 0.0–14.0)
NEUT#: 4.3 10*3/uL (ref 1.5–6.5)
NEUT%: 61.1 % (ref 39.0–75.0)
Platelets: 145 10*3/uL (ref 140–400)
RBC: 4.65 10*6/uL (ref 4.20–5.82)
RDW: 13.1 % (ref 11.0–14.6)
WBC: 7 10*3/uL (ref 4.0–10.3)

## 2013-11-21 LAB — COMPREHENSIVE METABOLIC PANEL (CC13)
ALT: 21 U/L (ref 0–55)
ANION GAP: 7 meq/L (ref 3–11)
AST: 19 U/L (ref 5–34)
Albumin: 3.6 g/dL (ref 3.5–5.0)
Alkaline Phosphatase: 71 U/L (ref 40–150)
BILIRUBIN TOTAL: 0.51 mg/dL (ref 0.20–1.20)
BUN: 14.6 mg/dL (ref 7.0–26.0)
CALCIUM: 9.1 mg/dL (ref 8.4–10.4)
CO2: 25 meq/L (ref 22–29)
CREATININE: 1 mg/dL (ref 0.7–1.3)
Chloride: 108 mEq/L (ref 98–109)
GLUCOSE: 125 mg/dL (ref 70–140)
Potassium: 4.1 mEq/L (ref 3.5–5.1)
SODIUM: 139 meq/L (ref 136–145)
TOTAL PROTEIN: 6.2 g/dL — AB (ref 6.4–8.3)

## 2013-11-21 MED ORDER — SODIUM CHLORIDE 0.9 % IV SOLN
90.0000 mg/m2 | Freq: Once | INTRAVENOUS | Status: AC
  Administered 2013-11-21: 189 mg via INTRAVENOUS
  Filled 2013-11-21: qty 2.1

## 2013-11-21 MED ORDER — SODIUM CHLORIDE 0.9 % IJ SOLN
10.0000 mL | INTRAMUSCULAR | Status: DC | PRN
  Administered 2013-11-21: 10 mL
  Filled 2013-11-21: qty 10

## 2013-11-21 MED ORDER — ONDANSETRON 8 MG/NS 50 ML IVPB
INTRAVENOUS | Status: AC
  Filled 2013-11-21: qty 8

## 2013-11-21 MED ORDER — DEXAMETHASONE SODIUM PHOSPHATE 10 MG/ML IJ SOLN
INTRAMUSCULAR | Status: AC
  Filled 2013-11-21: qty 1

## 2013-11-21 MED ORDER — DIPHENHYDRAMINE HCL 25 MG PO CAPS
ORAL_CAPSULE | ORAL | Status: AC
  Filled 2013-11-21: qty 2

## 2013-11-21 MED ORDER — SODIUM CHLORIDE 0.9 % IV SOLN
Freq: Once | INTRAVENOUS | Status: AC
  Administered 2013-11-21: 10:00:00 via INTRAVENOUS

## 2013-11-21 MED ORDER — DIPHENHYDRAMINE HCL 25 MG PO CAPS
50.0000 mg | ORAL_CAPSULE | Freq: Once | ORAL | Status: AC
  Administered 2013-11-21: 50 mg via ORAL

## 2013-11-21 MED ORDER — HEPARIN SOD (PORK) LOCK FLUSH 100 UNIT/ML IV SOLN
500.0000 [IU] | Freq: Once | INTRAVENOUS | Status: AC | PRN
  Administered 2013-11-21: 500 [IU]
  Filled 2013-11-21: qty 5

## 2013-11-21 MED ORDER — ACETAMINOPHEN 325 MG PO TABS
650.0000 mg | ORAL_TABLET | Freq: Once | ORAL | Status: AC
  Administered 2013-11-21: 650 mg via ORAL

## 2013-11-21 MED ORDER — RITUXIMAB CHEMO INJECTION 500 MG/50ML
375.0000 mg/m2 | Freq: Once | INTRAVENOUS | Status: AC
  Administered 2013-11-21: 800 mg via INTRAVENOUS
  Filled 2013-11-21: qty 80

## 2013-11-21 MED ORDER — SODIUM CHLORIDE 0.9 % IJ SOLN
10.0000 mL | INTRAMUSCULAR | Status: DC | PRN
  Administered 2013-11-21: 10 mL via INTRAVENOUS
  Filled 2013-11-21: qty 10

## 2013-11-21 MED ORDER — ACETAMINOPHEN 325 MG PO TABS
ORAL_TABLET | ORAL | Status: AC
  Filled 2013-11-21: qty 2

## 2013-11-21 MED ORDER — ONDANSETRON 8 MG/50ML IVPB (CHCC)
8.0000 mg | Freq: Once | INTRAVENOUS | Status: AC
  Administered 2013-11-21: 8 mg via INTRAVENOUS

## 2013-11-21 MED ORDER — DEXAMETHASONE SODIUM PHOSPHATE 10 MG/ML IJ SOLN
10.0000 mg | Freq: Once | INTRAMUSCULAR | Status: AC
  Administered 2013-11-21: 10 mg via INTRAVENOUS

## 2013-11-21 NOTE — Assessment & Plan Note (Signed)
Symptoms is due to side effects of chemotherapy. It is mild. I recommend he use Phenergan as needed

## 2013-11-21 NOTE — Assessment & Plan Note (Signed)
Overall, he tolerated chemotherapy well apart from minor, expected side effects. We will complete his last cycle of treatment today and restage him with another PET CT scan next month. If the PET scan came back negative, we will get the port removed.

## 2013-11-21 NOTE — Assessment & Plan Note (Signed)
I recommend over-the-counter laxatives as needed.

## 2013-11-21 NOTE — Patient Instructions (Signed)
[facility] Discharge Instructions for Patients Receiving Chemotherapy  Today you received the following chemotherapy agents Rituximab.   To help prevent nausea and vomiting after your treatment, we encourage you to take your nausea medication as directed.    If you develop nausea and vomiting that is not controlled by your nausea medication, call the clinic.   BELOW ARE SYMPTOMS THAT SHOULD BE REPORTED IMMEDIATELY:  *FEVER GREATER THAN 100.5 F  *CHILLS WITH OR WITHOUT FEVER  NAUSEA AND VOMITING THAT IS NOT CONTROLLED WITH YOUR NAUSEA MEDICATION  *UNUSUAL SHORTNESS OF BREATH  *UNUSUAL BRUISING OR BLEEDING  TENDERNESS IN MOUTH AND THROAT WITH OR WITHOUT PRESENCE OF ULCERS  *URINARY PROBLEMS  *BOWEL PROBLEMS  UNUSUAL RASH Items with * indicate a potential emergency and should be followed up as soon as possible.  Feel free to call the clinic you have any questions or concerns. The clinic phone number is [phone].

## 2013-11-21 NOTE — Progress Notes (Signed)
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   12/12/2003 - 06/01/2004 Chemotherapy He was treated with R. CHOP chemotherapy which show complete remission. The number of cycles of R. CHOP chemotherapy was unknown.   12/19/2006 Surgery Lung resection show follicular lymphoma.   01/02/2007 - 09/01/2008 Chemotherapy The patient was treated with single agent rituximab alone.   01/19/2007 Bone Marrow Biopsy Bone marrow biopsy was negative.   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   05/08/2013 - 05/11/2013 Hospital Admission The patient was admitted to the hospital for management of pericarditis. CT scan showed extensive lymphadenopathy.   06/07/2013 Imaging PET/CT scan showed extensive lymphadenopathy   06/25/2013 Procedure He has placement of Infuse-a-Port.   06/28/2013 Bone Marrow Biopsy Bone marrow biopsy is positive for lymphoma involvement.   07/04/2013 -  Chemotherapy He is treated with cycle 1 of bendamustine with rituximab.   09/24/2013 Imaging Repeat PET scan show complete remission.    INTERVAL HISTORY: Please see below for problem oriented charting. He is seen prior to cycle 6 of treatment. He has some mild nausea and constipation. Complained of fatigue.  REVIEW OF SYSTEMS:   Constitutional: Denies fevers, chills or abnormal weight loss Eyes: Denies blurriness of vision Ears, nose, mouth, throat, and face: Denies mucositis or sore throat Respiratory: Denies cough, dyspnea or  wheezes Cardiovascular: Denies palpitation, chest discomfort or lower extremity swelling Skin: Denies abnormal skin rashes Lymphatics: Denies new lymphadenopathy or easy bruising Neurological:Denies numbness, tingling or new weaknesses Behavioral/Psych: Mood is stable, no new changes  All other systems were reviewed with the patient and are negative.  I have reviewed the past medical history, past surgical history, social history and family history with the patient and they are unchanged from previous note.  ALLERGIES:  has No Known Allergies.  MEDICATIONS:  Current Outpatient Prescriptions  Medication Sig Dispense Refill  . aspirin EC 81 MG tablet Take 81 mg by mouth every morning.    Marland Kitchen atorvastatin (LIPITOR) 10 MG tablet Take 5 mg by mouth daily. Takes half tablet daily    . cycloSPORINE (RESTASIS) 0.05 % ophthalmic emulsion Place 1 drop into both eyes 2 (two) times daily.    Marland Kitchen diltiazem (CARDIZEM CD) 120 MG 24 hr capsule Take 1 capsule (120 mg total) by mouth every morning. 90 capsule 3  . docusate sodium (COLACE) 100 MG capsule Take 100 mg by mouth 2 (two) times daily.    Marland Kitchen lidocaine-prilocaine (EMLA) cream Apply 1 application topically as needed. 30 g 0  . loratadine (CLARITIN) 10 MG tablet Take 10 mg by mouth daily.    . Menthol-Methyl Salicylate (MUSCLE RUB) 10-15 % CREA Apply 1 application topically as needed for muscle pain.    . metoprolol succinate (TOPROL-XL) 25 MG 24 hr tablet Take 25 mg by mouth every morning.    . Multiple Vitamin (MULTIVITAMIN WITH MINERALS) TABS tablet Take 1 tablet by mouth daily.    Marland Kitchen omeprazole (PRILOSEC) 40 MG capsule Take 40  mg by mouth daily.    . ondansetron (ZOFRAN) 8 MG tablet Take 1 tablet (8 mg total) by mouth every 8 (eight) hours as needed for nausea. 30 tablet 3  . oxyCODONE (OXY IR/ROXICODONE) 5 MG immediate release tablet Take 1 tablet (5 mg total) by mouth every 4 (four) hours as needed for severe pain. 30 tablet 0  . promethazine  (PHENERGAN) 25 MG tablet Take 1 tablet (25 mg total) by mouth every 6 (six) hours as needed for nausea. 30 tablet 3   No current facility-administered medications for this visit.    PHYSICAL EXAMINATION: ECOG PERFORMANCE STATUS: 1 - Symptomatic but completely ambulatory  Filed Vitals:   11/21/13 0920  BP: 113/69  Pulse: 57  Temp: 98.4 F (36.9 C)  Resp: 18   Filed Weights   11/21/13 0920  Weight: 194 lb 6.4 oz (88.179 kg)    GENERAL:alert, no distress and comfortable SKIN: skin color, texture, turgor are normal, no rashes or significant lesions EYES: normal, Conjunctiva are pink and non-injected, sclera clear OROPHARYNX:no exudate, no erythema and lips, buccal mucosa, and tongue normal  NECK: supple, thyroid normal size, non-tender, without nodularity LYMPH:  no palpable lymphadenopathy in the cervical, axillary or inguinal LUNGS: clear to auscultation and percussion with normal breathing effort HEART: regular rate & rhythm and no murmurs and no lower extremity edema ABDOMEN:abdomen soft, non-tender and normal bowel sounds Musculoskeletal:no cyanosis of digits and no clubbing  NEURO: alert & oriented x 3 with fluent speech, no focal motor/sensory deficits  LABORATORY DATA:  I have reviewed the data as listed    Component Value Date/Time   NA 141 10/24/2013 0803   NA 140 06/28/2013 0705   NA 139 07/20/2011 0800   K 4.1 10/24/2013 0803   K 3.9 06/28/2013 0705   K 4.4 07/20/2011 0800   CL 103 06/28/2013 0705   CL 104 03/24/2012 1024   CL 100 07/20/2011 0800   CO2 24 10/24/2013 0803   CO2 24 06/28/2013 0705   CO2 29 07/20/2011 0800   GLUCOSE 102 10/24/2013 0803   GLUCOSE 111* 06/28/2013 0705   GLUCOSE 80 03/24/2012 1024   GLUCOSE 100 07/20/2011 0800   BUN 12.2 10/24/2013 0803   BUN 19 06/28/2013 0705   BUN 14 07/20/2011 0800   CREATININE 1.0 10/24/2013 0803   CREATININE 1.06 06/28/2013 0705   CREATININE 1.2 07/20/2011 0800   CALCIUM 9.3 10/24/2013 0803    CALCIUM 9.0 06/28/2013 0705   CALCIUM 8.7 07/20/2011 0800   PROT 6.1* 10/24/2013 0803   PROT 6.5 06/28/2013 0705   PROT 7.2 07/20/2011 0800   ALBUMIN 3.6 10/24/2013 0803   ALBUMIN 3.8 06/28/2013 0705   AST 21 10/24/2013 0803   AST 23 06/28/2013 0705   AST 25 07/20/2011 0800   ALT 23 10/24/2013 0803   ALT 26 06/28/2013 0705   ALT 29 07/20/2011 0800   ALKPHOS 60 10/24/2013 0803   ALKPHOS 64 06/28/2013 0705   ALKPHOS 55 07/20/2011 0800   BILITOT 0.68 10/24/2013 0803   BILITOT 0.6 06/28/2013 0705   BILITOT 1.00 07/20/2011 0800   GFRNONAA 70* 06/28/2013 0705   GFRAA 81* 06/28/2013 0705    No results found for: SPEP, UPEP  Lab Results  Component Value Date   WBC 7.0 11/21/2013   NEUTROABS 4.3 11/21/2013   HGB 14.0 11/21/2013   HCT 41.1 11/21/2013   MCV 88.4 11/21/2013   PLT 145 11/21/2013      Chemistry  Component Value Date/Time   NA 141 10/24/2013 0803   NA 140 06/28/2013 0705   NA 139 07/20/2011 0800   K 4.1 10/24/2013 0803   K 3.9 06/28/2013 0705   K 4.4 07/20/2011 0800   CL 103 06/28/2013 0705   CL 104 03/24/2012 1024   CL 100 07/20/2011 0800   CO2 24 10/24/2013 0803   CO2 24 06/28/2013 0705   CO2 29 07/20/2011 0800   BUN 12.2 10/24/2013 0803   BUN 19 06/28/2013 0705   BUN 14 07/20/2011 0800   CREATININE 1.0 10/24/2013 0803   CREATININE 1.06 06/28/2013 0705   CREATININE 1.2 07/20/2011 0800      Component Value Date/Time   CALCIUM 9.3 10/24/2013 0803   CALCIUM 9.0 06/28/2013 0705   CALCIUM 8.7 07/20/2011 0800   ALKPHOS 60 10/24/2013 0803   ALKPHOS 64 06/28/2013 0705   ALKPHOS 55 07/20/2011 0800   AST 21 10/24/2013 0803   AST 23 06/28/2013 0705   AST 25 07/20/2011 0800   ALT 23 10/24/2013 0803   ALT 26 06/28/2013 0705   ALT 29 07/20/2011 0800   BILITOT 0.68 10/24/2013 0803   BILITOT 0.6 06/28/2013 0705   BILITOT 1.00 07/20/2011 0800     ASSESSMENT & PLAN:  Low grade B-cell lymphoma Overall, he tolerated chemotherapy well apart from minor,  expected side effects. We will complete his last cycle of treatment today and restage him with another PET CT scan next month. If the PET scan came back negative, we will get the port removed.   Heartburn I recommend Zantac daily and tums as needed for heartburn.  Constipation I recommend over-the-counter laxatives as needed.     Nausea without vomiting Symptoms is due to side effects of chemotherapy. It is mild. I recommend he use Phenergan as needed   Orders Placed This Encounter  Procedures  . NM PET Image Restag (PS) Skull Base To Thigh    Standing Status: Future     Number of Occurrences:      Standing Expiration Date: 01/21/2015    Order Specific Question:  Reason for Exam (SYMPTOM  OR DIAGNOSIS REQUIRED)    Answer:  staging lymphoma assess response to Rx    Order Specific Question:  Preferred imaging location?    Answer:  [facility] [place]   All questions were answered. The patient knows to call the clinic with any problems, questions or concerns. No barriers to learning was detected. I spent 30 minutes counseling the patient face to face. The total time spent in the appointment was 40 minutes and more than 50% was on counseling and review of test results     [facility], [place], MD 11/21/2013 9:36 AM

## 2013-11-21 NOTE — Assessment & Plan Note (Signed)
I recommend Zantac daily and tums as needed for heartburn.

## 2013-11-21 NOTE — Telephone Encounter (Signed)
Pt confirmed labs/ov per 11/11 POF, gave pt AVS..... KJ

## 2013-11-21 NOTE — Patient Instructions (Signed)

## 2013-11-22 ENCOUNTER — Ambulatory Visit (HOSPITAL_BASED_OUTPATIENT_CLINIC_OR_DEPARTMENT_OTHER): Payer: Medicare Other

## 2013-11-22 DIAGNOSIS — C8510 Unspecified B-cell lymphoma, unspecified site: Secondary | ICD-10-CM | POA: Diagnosis not present

## 2013-11-22 DIAGNOSIS — Z5111 Encounter for antineoplastic chemotherapy: Secondary | ICD-10-CM | POA: Diagnosis not present

## 2013-11-22 MED ORDER — ONDANSETRON 8 MG/NS 50 ML IVPB
INTRAVENOUS | Status: AC
  Filled 2013-11-22: qty 8

## 2013-11-22 MED ORDER — HEPARIN SOD (PORK) LOCK FLUSH 100 UNIT/ML IV SOLN
500.0000 [IU] | Freq: Once | INTRAVENOUS | Status: AC | PRN
  Administered 2013-11-22: 500 [IU]
  Filled 2013-11-22: qty 5

## 2013-11-22 MED ORDER — SODIUM CHLORIDE 0.9 % IV SOLN
Freq: Once | INTRAVENOUS | Status: AC
  Administered 2013-11-22: 09:00:00 via INTRAVENOUS

## 2013-11-22 MED ORDER — SODIUM CHLORIDE 0.9 % IJ SOLN
10.0000 mL | INTRAMUSCULAR | Status: DC | PRN
  Administered 2013-11-22: 10 mL
  Filled 2013-11-22: qty 10

## 2013-11-22 MED ORDER — ONDANSETRON 8 MG/50ML IVPB (CHCC)
8.0000 mg | Freq: Once | INTRAVENOUS | Status: AC
  Administered 2013-11-22: 8 mg via INTRAVENOUS

## 2013-11-22 MED ORDER — DEXAMETHASONE SODIUM PHOSPHATE 10 MG/ML IJ SOLN
10.0000 mg | Freq: Once | INTRAMUSCULAR | Status: AC
  Administered 2013-11-22: 10 mg via INTRAVENOUS

## 2013-11-22 MED ORDER — DEXAMETHASONE SODIUM PHOSPHATE 10 MG/ML IJ SOLN
INTRAMUSCULAR | Status: AC
  Filled 2013-11-22: qty 1

## 2013-11-22 MED ORDER — SODIUM CHLORIDE 0.9 % IV SOLN
90.0000 mg/m2 | Freq: Once | INTRAVENOUS | Status: AC
  Administered 2013-11-22: 189 mg via INTRAVENOUS
  Filled 2013-11-22: qty 2.1

## 2013-11-22 NOTE — Progress Notes (Signed)
Anthony Kelly was discharged at 51, ambulatory in no distress with spouse.

## 2013-11-22 NOTE — Patient Instructions (Signed)
[facility] Discharge Instructions for Patients Receiving Chemotherapy  Today you received the following chemotherapy agents Treanda.  To help prevent nausea and vomiting after your treatment, we encourage you to take your nausea medication Phenergan as ordered.   If you develop nausea and vomiting that is not controlled by your nausea medication, call the clinic.   BELOW ARE SYMPTOMS THAT SHOULD BE REPORTED IMMEDIATELY:  *FEVER GREATER THAN 100.5 F  *CHILLS WITH OR WITHOUT FEVER  NAUSEA AND VOMITING THAT IS NOT CONTROLLED WITH YOUR NAUSEA MEDICATION  *UNUSUAL SHORTNESS OF BREATH  *UNUSUAL BRUISING OR BLEEDING  TENDERNESS IN MOUTH AND THROAT WITH OR WITHOUT PRESENCE OF ULCERS  *URINARY PROBLEMS  *BOWEL PROBLEMS  UNUSUAL RASH Items with * indicate a potential emergency and should be followed up as soon as possible.  Feel free to call the clinic you have any questions or concerns. The clinic phone number is [phone].

## 2013-11-22 NOTE — Progress Notes (Signed)
Patient reports he received flu vaccine on 11-11-2013.

## 2013-11-26 ENCOUNTER — Emergency Department (HOSPITAL_COMMUNITY): Payer: Medicare Other

## 2013-11-26 ENCOUNTER — Encounter (HOSPITAL_COMMUNITY): Payer: Self-pay | Admitting: Radiology

## 2013-11-26 ENCOUNTER — Other Ambulatory Visit: Payer: Self-pay | Admitting: Oncology

## 2013-11-26 ENCOUNTER — Telehealth: Payer: Self-pay | Admitting: *Deleted

## 2013-11-26 ENCOUNTER — Inpatient Hospital Stay (HOSPITAL_COMMUNITY)
Admission: EM | Admit: 2013-11-26 | Discharge: 2013-11-27 | DRG: 392 | Disposition: A | Discharge status: Home / Self Care | Payer: Medicare Other | Attending: Internal Medicine | Admitting: Internal Medicine

## 2013-11-26 DIAGNOSIS — D696 Thrombocytopenia, unspecified: Secondary | ICD-10-CM | POA: Diagnosis present

## 2013-11-26 DIAGNOSIS — M1990 Unspecified osteoarthritis, unspecified site: Secondary | ICD-10-CM | POA: Diagnosis present

## 2013-11-26 DIAGNOSIS — C779 Secondary and unspecified malignant neoplasm of lymph node, unspecified: Secondary | ICD-10-CM | POA: Diagnosis present

## 2013-11-26 DIAGNOSIS — Z8572 Personal history of non-Hodgkin lymphomas: Secondary | ICD-10-CM | POA: Diagnosis present

## 2013-11-26 DIAGNOSIS — K5900 Constipation, unspecified: Secondary | ICD-10-CM | POA: Diagnosis present

## 2013-11-26 DIAGNOSIS — R109 Unspecified abdominal pain: Secondary | ICD-10-CM

## 2013-11-26 DIAGNOSIS — Z7982 Long term (current) use of aspirin: Secondary | ICD-10-CM

## 2013-11-26 DIAGNOSIS — K529 Noninfective gastroenteritis and colitis, unspecified: Principal | ICD-10-CM | POA: Diagnosis present

## 2013-11-26 DIAGNOSIS — R112 Nausea with vomiting, unspecified: Secondary | ICD-10-CM | POA: Diagnosis not present

## 2013-11-26 DIAGNOSIS — C8590 Non-Hodgkin lymphoma, unspecified, unspecified site: Secondary | ICD-10-CM | POA: Diagnosis not present

## 2013-11-26 DIAGNOSIS — R1032 Left lower quadrant pain: Secondary | ICD-10-CM | POA: Diagnosis not present

## 2013-11-26 DIAGNOSIS — C7800 Secondary malignant neoplasm of unspecified lung: Secondary | ICD-10-CM | POA: Diagnosis present

## 2013-11-26 DIAGNOSIS — I4891 Unspecified atrial fibrillation: Secondary | ICD-10-CM | POA: Diagnosis present

## 2013-11-26 DIAGNOSIS — K5792 Diverticulitis of intestine, part unspecified, without perforation or abscess without bleeding: Secondary | ICD-10-CM | POA: Diagnosis present

## 2013-11-26 DIAGNOSIS — Z79899 Other long term (current) drug therapy: Secondary | ICD-10-CM

## 2013-11-26 DIAGNOSIS — C8510 Unspecified B-cell lymphoma, unspecified site: Secondary | ICD-10-CM | POA: Diagnosis present

## 2013-11-26 DIAGNOSIS — R1110 Vomiting, unspecified: Secondary | ICD-10-CM

## 2013-11-26 DIAGNOSIS — C3490 Malignant neoplasm of unspecified part of unspecified bronchus or lung: Secondary | ICD-10-CM | POA: Diagnosis not present

## 2013-11-26 DIAGNOSIS — E785 Hyperlipidemia, unspecified: Secondary | ICD-10-CM | POA: Diagnosis present

## 2013-11-26 DIAGNOSIS — R1030 Lower abdominal pain, unspecified: Secondary | ICD-10-CM | POA: Diagnosis not present

## 2013-11-26 DIAGNOSIS — C8280 Other types of follicular lymphoma, unspecified site: Secondary | ICD-10-CM | POA: Diagnosis present

## 2013-11-26 DIAGNOSIS — Z87891 Personal history of nicotine dependence: Secondary | ICD-10-CM

## 2013-11-26 LAB — CBC WITH DIFFERENTIAL/PLATELET
Basophils Absolute: 0 10*3/uL (ref 0.0–0.1)
Basophils Relative: 0 % (ref 0–1)
EOS PCT: 1 % (ref 0–5)
Eosinophils Absolute: 0.1 10*3/uL (ref 0.0–0.7)
HEMATOCRIT: 42.8 % (ref 39.0–52.0)
HEMOGLOBIN: 15.1 g/dL (ref 13.0–17.0)
LYMPHS ABS: 0.7 10*3/uL (ref 0.7–4.0)
Lymphocytes Relative: 9 % — ABNORMAL LOW (ref 12–46)
MCH: 30.3 pg (ref 26.0–34.0)
MCHC: 35.3 g/dL (ref 30.0–36.0)
MCV: 85.9 fL (ref 78.0–100.0)
MONOS PCT: 11 % (ref 3–12)
Monocytes Absolute: 1 10*3/uL (ref 0.1–1.0)
NEUTROS ABS: 6.6 10*3/uL (ref 1.7–7.7)
Neutrophils Relative %: 79 % — ABNORMAL HIGH (ref 43–77)
Platelets: 156 10*3/uL (ref 150–400)
RBC: 4.98 MIL/uL (ref 4.22–5.81)
RDW: 12.8 % (ref 11.5–15.5)
WBC: 8.4 10*3/uL (ref 4.0–10.5)

## 2013-11-26 LAB — COMPREHENSIVE METABOLIC PANEL
ALT: 21 U/L (ref 0–53)
AST: 17 U/L (ref 0–37)
Albumin: 3.3 g/dL — ABNORMAL LOW (ref 3.5–5.2)
Alkaline Phosphatase: 67 U/L (ref 39–117)
Anion gap: 15 (ref 5–15)
BILIRUBIN TOTAL: 0.9 mg/dL (ref 0.3–1.2)
BUN: 17 mg/dL (ref 6–23)
CALCIUM: 9.1 mg/dL (ref 8.4–10.5)
CO2: 24 mEq/L (ref 19–32)
CREATININE: 0.97 mg/dL (ref 0.50–1.35)
Chloride: 100 mEq/L (ref 96–112)
GFR, EST NON AFRICAN AMERICAN: 83 mL/min — AB (ref >90)
GLUCOSE: 154 mg/dL — AB (ref 70–99)
Potassium: 3.5 mEq/L — ABNORMAL LOW (ref 3.7–5.3)
Sodium: 139 mEq/L (ref 137–147)
Total Protein: 6.6 g/dL (ref 6.0–8.3)

## 2013-11-26 LAB — I-STAT TROPONIN, ED: TROPONIN I, POC: 0.02 ng/mL (ref 0.00–0.08)

## 2013-11-26 IMAGING — CT CT ABD-PELV W/ CM
1 of 3 series · 13 of 32 positions shown, 18 images · IV contrast (OMNIPAQUE 300)
Comparison: PET-CT [DATE]

CLINICAL DATA: Lung cancer and lymphoma, ongoing chemotherapy, no
bowel movement for 6 days, abdominal pain, vomiting, cramping,
history diverticulitis, atrial fibrillation

EXAM:
CT ABDOMEN AND PELVIS WITH CONTRAST
TECHNIQUE: Multidetector CT imaging of the abdomen and pelvis was performed
using the standard protocol following bolus administration of
intravenous contrast. Sagittal and coronal MPR images reconstructed
from axial data set.
CONTRAST:  Dilute oral contrast.  100 cc Omnipaque 300 IV

[Series 2: abd/pel with · axial · 0.92mm/px · z∈[-470,-30]mm · 13 of 100 slices shown, 18 images]
[im 6/100  soft-tissue]
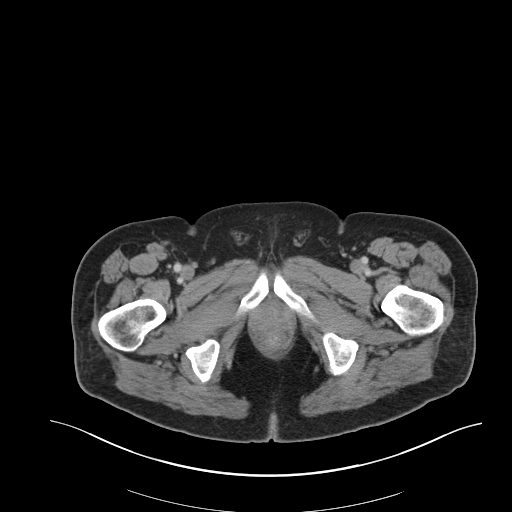
[im 6/100  bone]
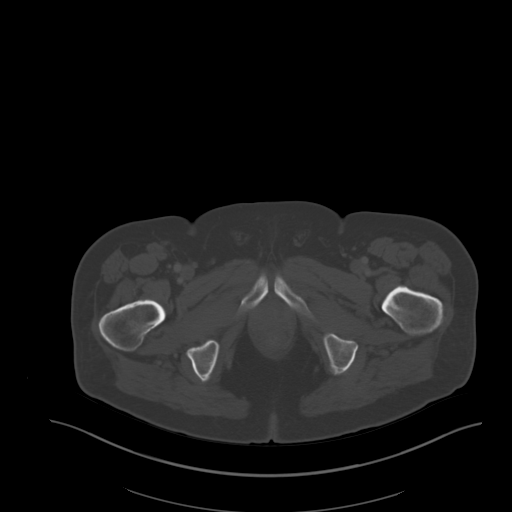
[im 17/100  soft-tissue]
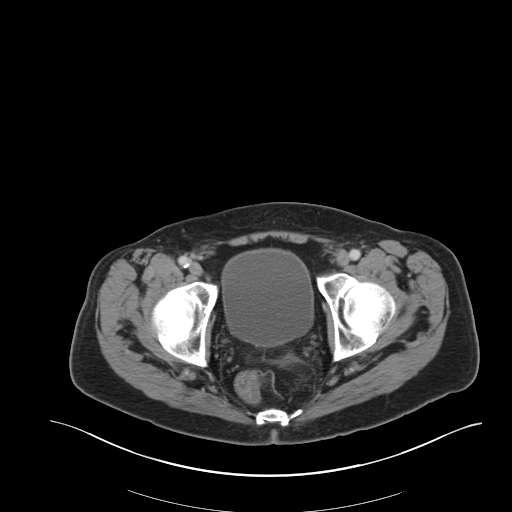
[im 23/100  soft-tissue]
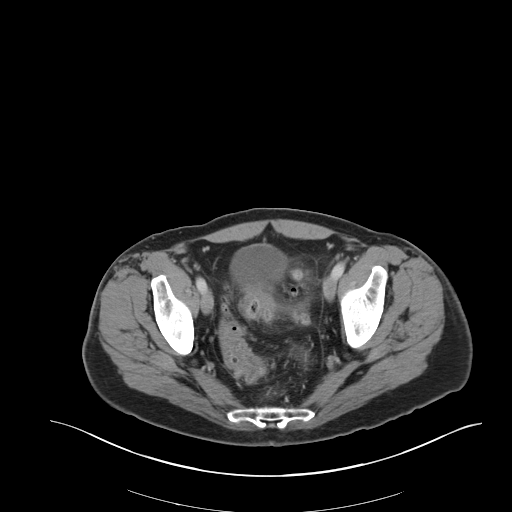
[im 28/100  soft-tissue]
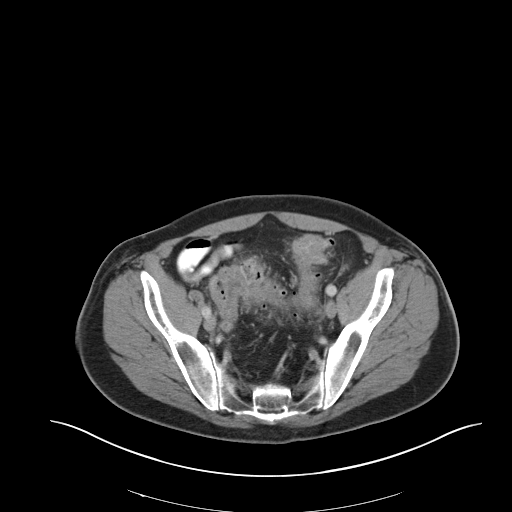
[im 39/100  soft-tissue]
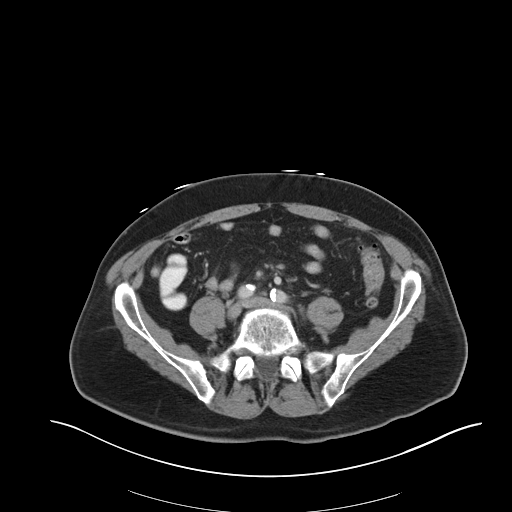
[im 45/100  soft-tissue]
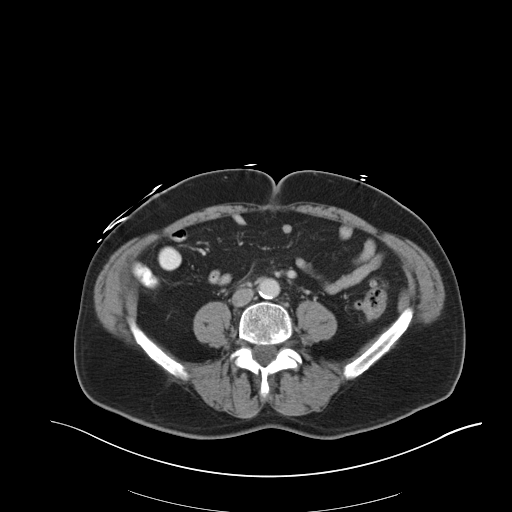
[im 56/100  soft-tissue]
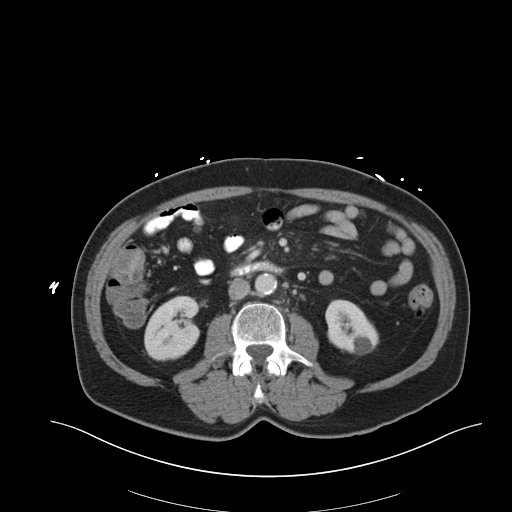
[im 61/100  soft-tissue]
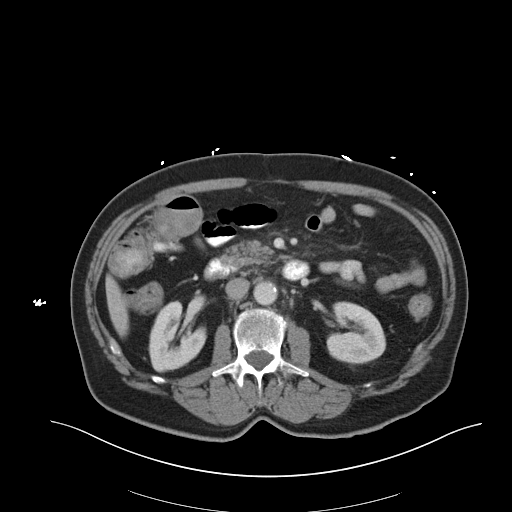
[im 72/100  soft-tissue]
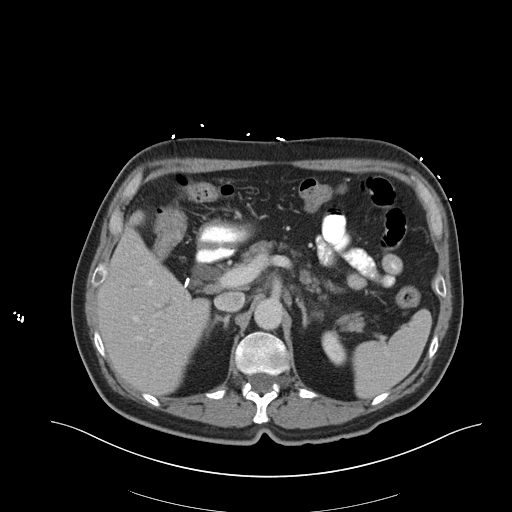
[im 72/100  bone]
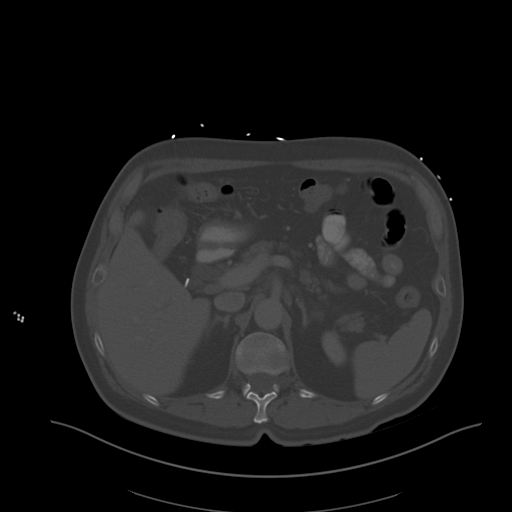
[im 78/100  soft-tissue]
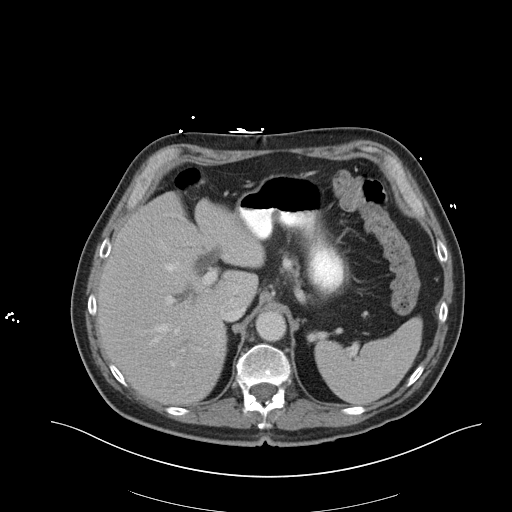
[im 78/100  lung]
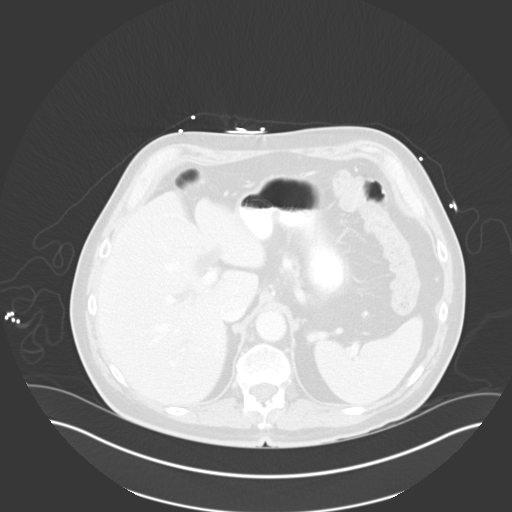
[im 83/100  soft-tissue]
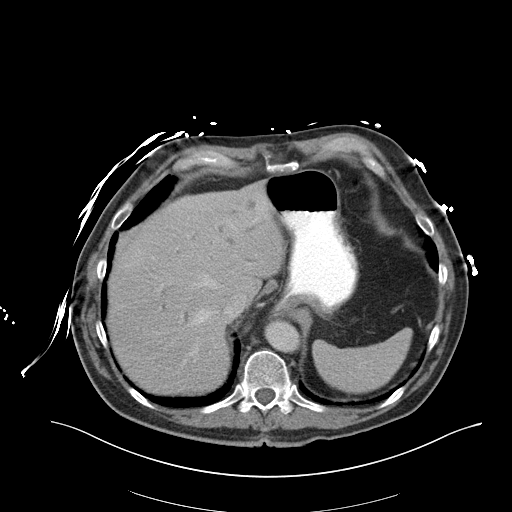
[im 83/100  lung]
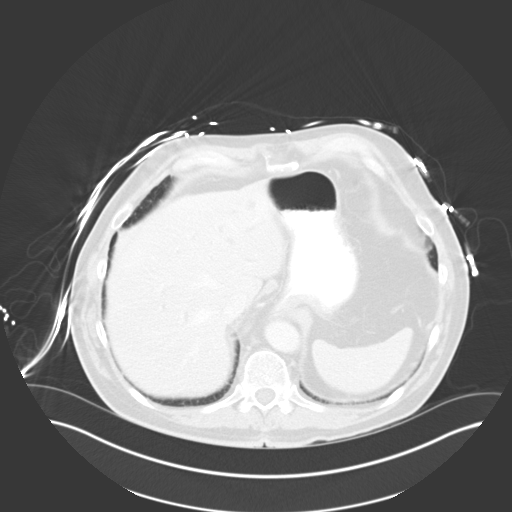
[im 89/100  lung]
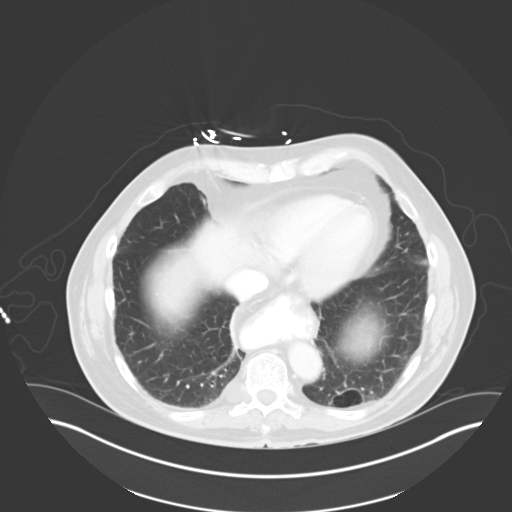
[im 94/100  soft-tissue]
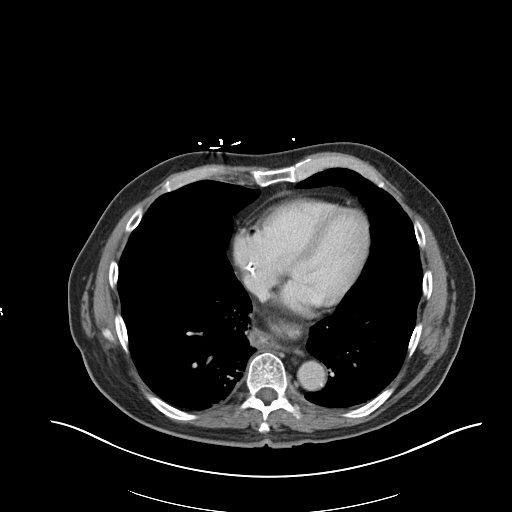
[im 94/100  lung]
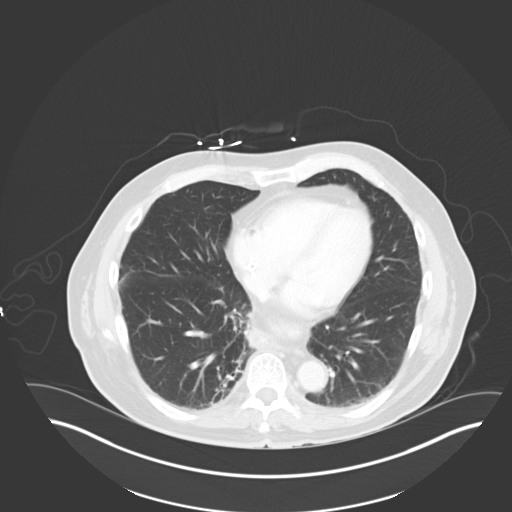

[13 of 32 positions shown; findings below may reference images not displayed]

FINDINGS: Minimal atelectasis at LEFT lower lobe.

Intrahepatic biliary dilatation post cholecystectomy with 11 mm CBD
not significantly changed.

11 x 10 mm low to intermediate attenuation lesion lateral segment
LEFT lobe stable since [NQ].

Small LEFT renal cysts stable.

Liver, spleen, pancreas, kidneys, and adrenal glands otherwise
normal.

Infiltration of fat planes at the region of the celiac axis and SMA
origin unchanged.

Moderate-sized hiatal hernia.

Appendix not visualized.

Diverticulosis of descending and sigmoid colon with diffuse wall
thickening and mild pericolic inflammatory changes of the sigmoid
colon.

Hyperemia of sigmoid mesocolon.

Additionally, wall of remaining sigmoid colon and rectum appears
thickened.

It is uncertain whether the changes represent sigmoid diverticulitis
or diffuse colitis of the rectosigmoid colon in a patient with
coexistent chronic diverticular disease.

No evidence of perforation or abscess.

Stomach, small bowel loops and proximal colon otherwise
unremarkable.

Scattered atherosclerotic calcifications.

Unremarkable bladder with mild prostatic enlargement.

No mass, adenopathy, free fluid, or free air.

Degenerative disc disease changes L5-S1 with retrolisthesis.
IMPRESSION: Diverticulosis of the descending and sigmoid colon.

Wall thickening of the sigmoid colon and rectum, with minimal
pericolic infiltrative changes; findings could be due to sigmoid
diverticulitis or diffuse colitis of the rectosigmoid colon in
patient with chronic diverticular disease.

Involvement of the distal sigmoid colon and rectum favors distal
colitis, which could be due to infection or inflammatory bowel
disease, less likely ischemia by distribution.

No evidence of perforation, obstruction or abscess.

## 2013-11-26 IMAGING — CR DG CHEST 2V
3 series · 3 of 3 positions shown · non-contrast
Comparison: [DATE]

CLINICAL DATA: Nausea and vomiting for 2 days

EXAM:
CHEST  2 VIEW

[w chest pa]
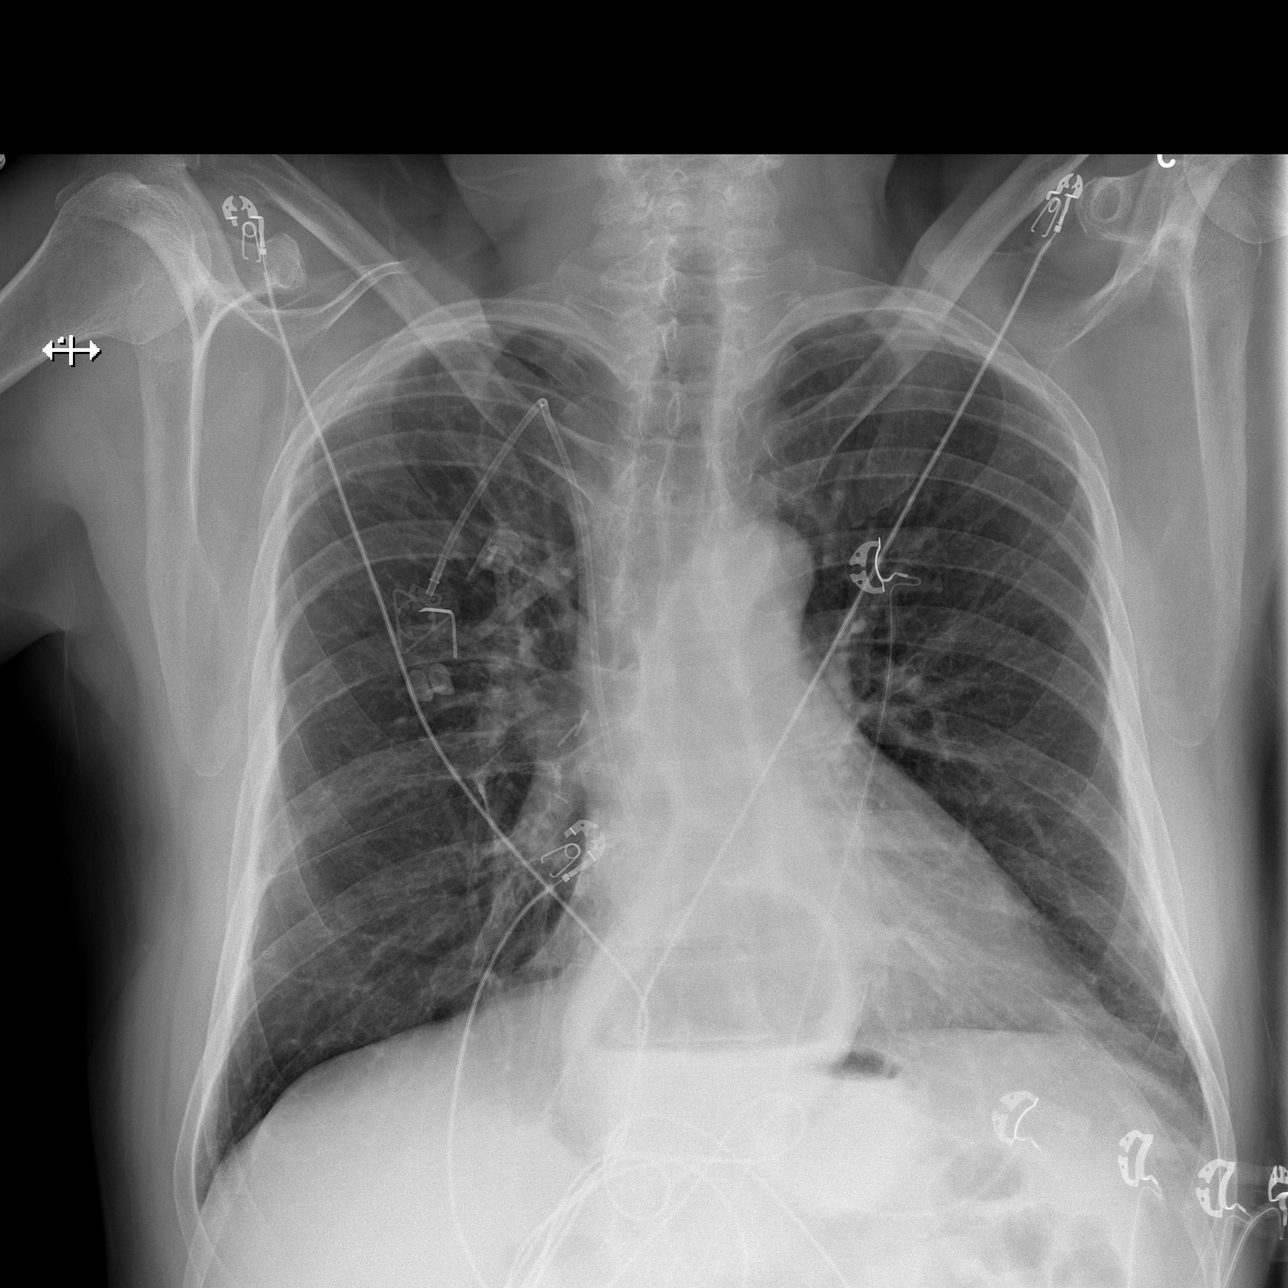

[w chest lat (1 of 2)]
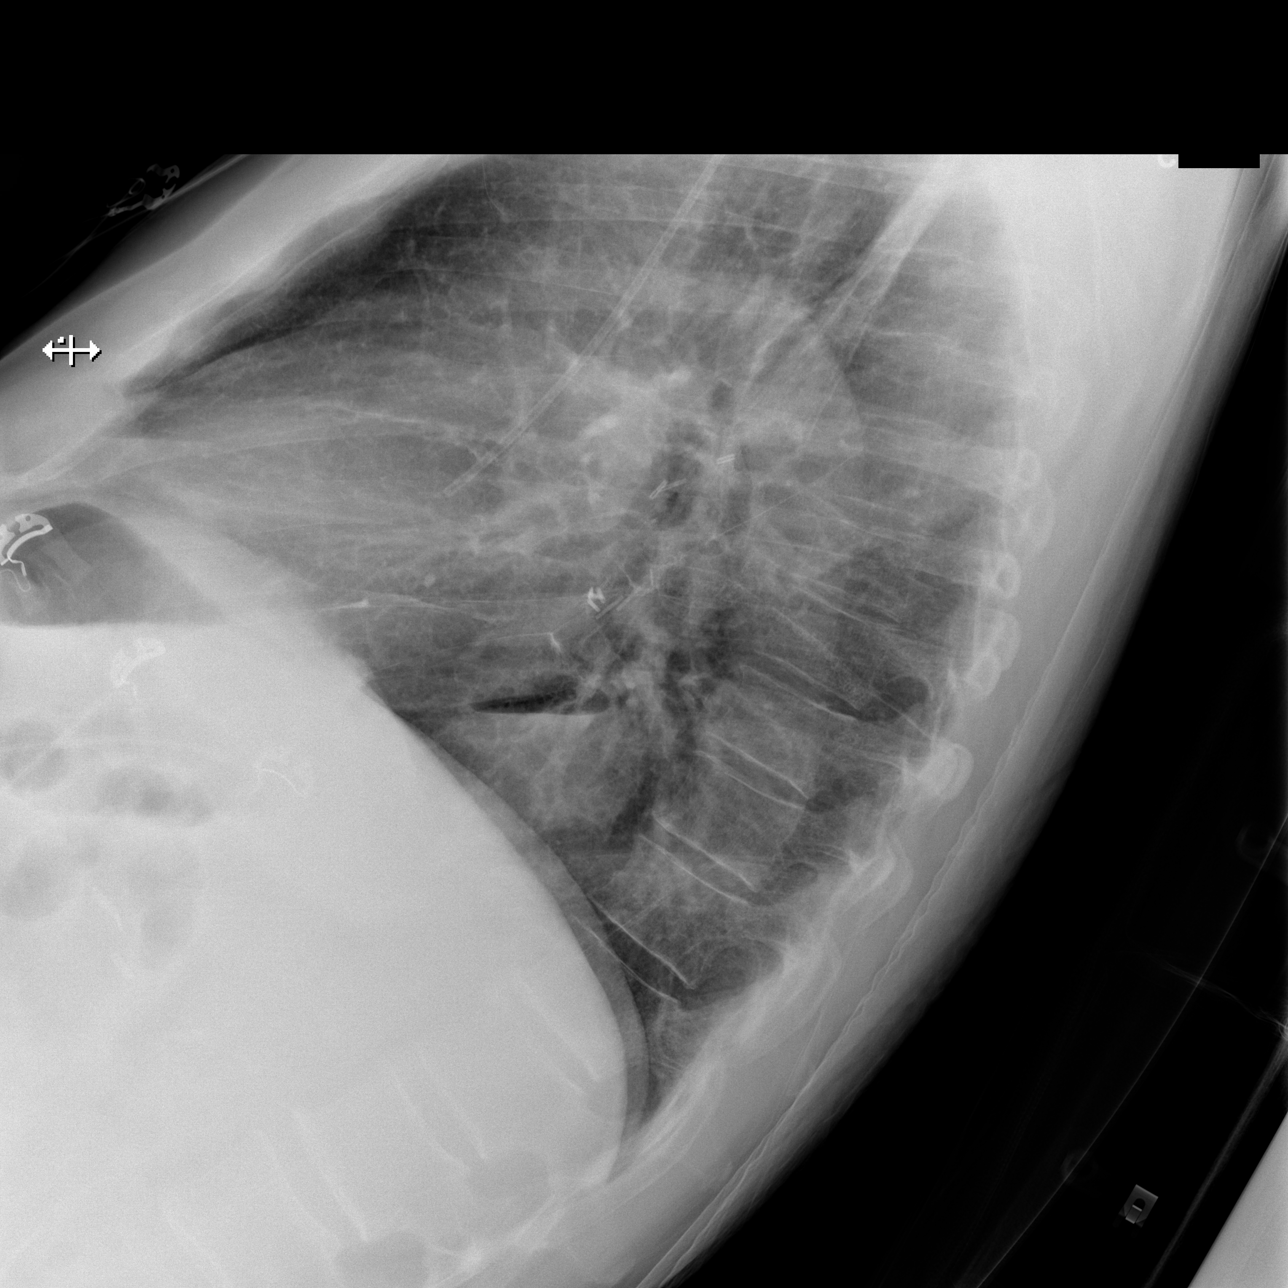

[w chest lat (2 of 2)]
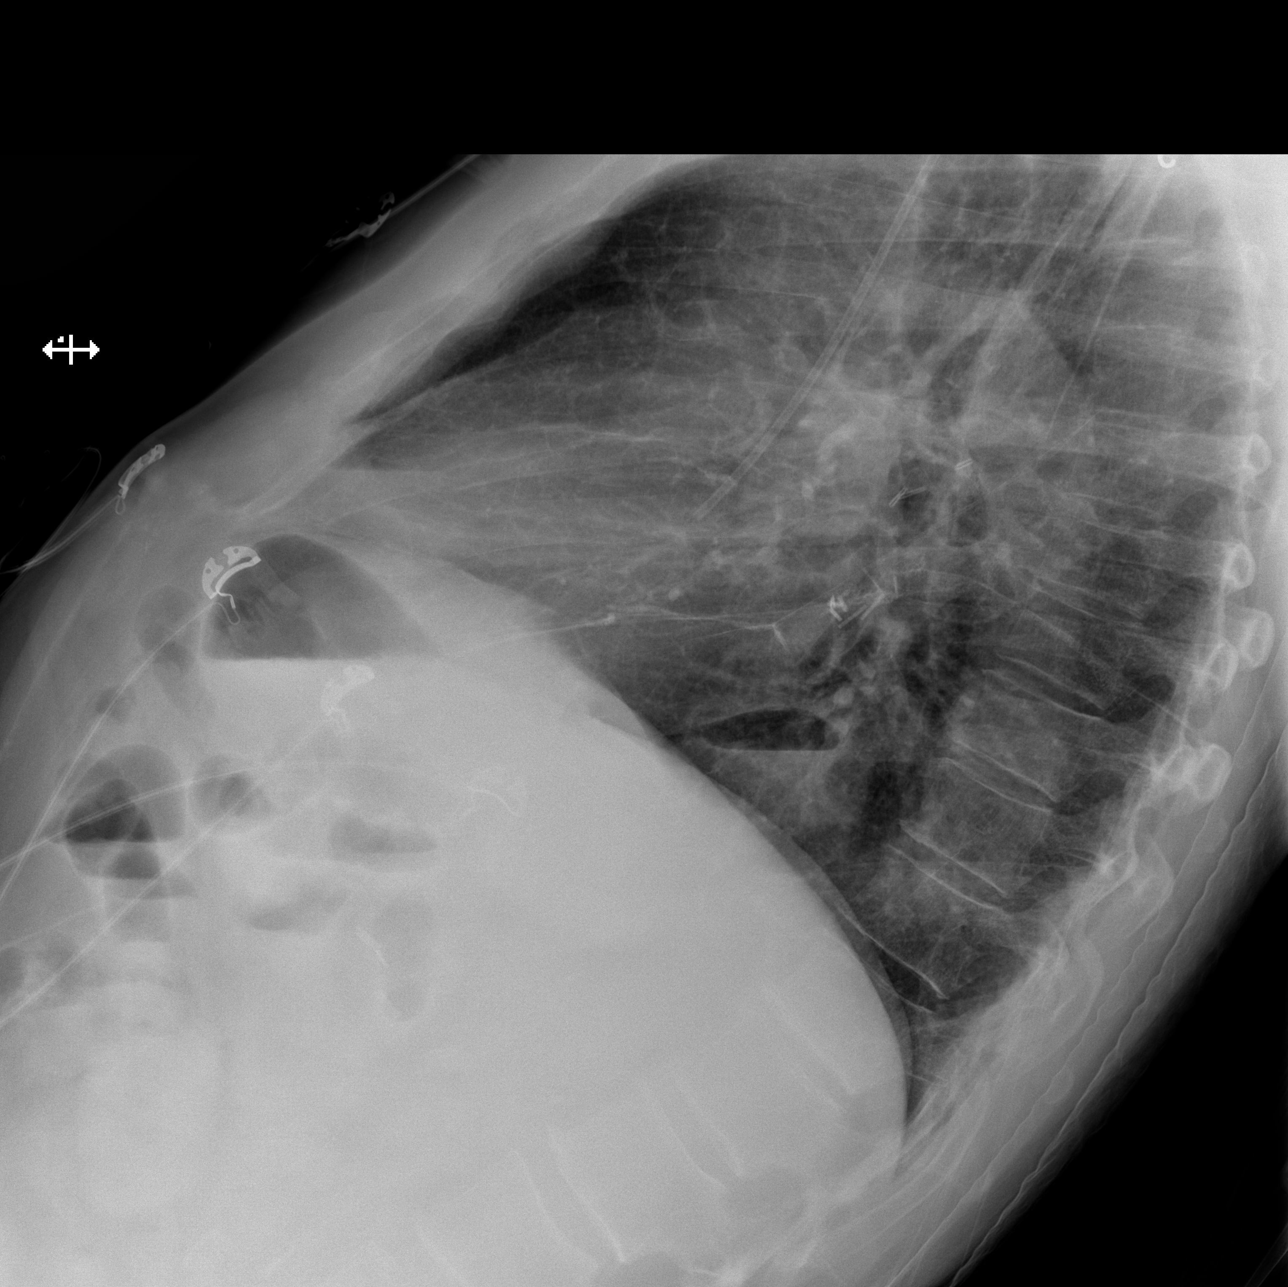

[3 of 3 positions shown; findings below may reference images not displayed]

FINDINGS: Cardiac shadow is within normal limits. A hiatal hernia is again
noted. A right chest wall port is seen with catheter tip at the
cavoatrial junction. Postoperative changes are noted on the right.
The lungs are well-aerated without focal infiltrate or sizable
effusion.
IMPRESSION: No acute abnormality noted.

## 2013-11-26 MED ORDER — HYDROMORPHONE HCL 1 MG/ML IJ SOLN
1.0000 mg | INTRAMUSCULAR | Status: DC | PRN
  Administered 2013-11-26: 1 mg via INTRAVENOUS
  Filled 2013-11-26: qty 1

## 2013-11-26 MED ORDER — IOHEXOL 300 MG/ML  SOLN: 50.0000 mL | Freq: Once | INTRAMUSCULAR | Status: AC | PRN

## 2013-11-26 MED ORDER — IOHEXOL 300 MG/ML  SOLN: 100.0000 mL | Freq: Once | INTRAMUSCULAR | Status: AC | PRN

## 2013-11-26 MED ORDER — SODIUM CHLORIDE 0.9 % IV BOLUS (SEPSIS)
1000.0000 mL | Freq: Once | INTRAVENOUS | Status: AC
  Administered 2013-11-26: 1000 mL via INTRAVENOUS

## 2013-11-26 NOTE — ED Provider Notes (Signed)
CSN: [id number]     Arrival date & time 11/26/13  1922 History   First MD Initiated Contact with Patient 11/26/13 1957     Chief Complaint  Patient presents with  . Abdominal Pain  . Nausea  . Emesis     (Consider location/radiation/quality/duration/timing/severity/associated sxs/prior Treatment) Patient is a 68 y.o. male presenting with abdominal pain.  Abdominal Pain Pain location:  Generalized Pain quality comment:  Soreness Pain radiates to:  Does not radiate Pain severity:  Severe Onset quality:  Gradual Duration:  2 days Timing:  Constant Progression:  Worsening Chronicity:  Recurrent Context comment:  On chemotherapy Relieved by:  Nothing Worsened by:  Palpation, movement and eating Associated symptoms: chills, constipation, nausea and vomiting   Associated symptoms: no anorexia, no diarrhea, no fever and no shortness of breath     Past Medical History  Diagnosis Date  . PSVT (paroxysmal supraventricular tachycardia)   . Arthritis   . 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 Metastasis to lymph nodes dx'd 03/2011    lt submandibular ln  . Atrial fibrillation     a. Isolated episode in the setting of acute pericarditis 04/2013. Was not placed on anticoag.  Marland Kitchen Acute pericarditis     a. 04/2013 -adm with CP, elevated CRP. H/o coronary artery calcification on prior CT but nuc was negative, EF 71%.  . Pain in joint, pelvic region and thigh 08/01/2013  . Diverticulitis 08/16/2013   Past Surgical History  Procedure Laterality Date  . Lung lobectomy      right side  . Cholecystectomy    . Lymph node biopsy      in groin with removal  . Meniscus repair      right knee  . Vein  ligation and stripping      right leg  . Tonsillectomy      as a child  . Submandibular gland excision  04/2011  . Submandibular gland excision  04/30/2011    Procedure: EXCISION SUBMANDIBULAR GLAND;  Surgeon: Jerrell Belfast, MD;  Location: [facility];  Service: ENT;  Laterality: Left;  WITH DIAGNOSTIC BIOPSY  . Exploratory laparotomy     Family History  Problem Relation Age of Onset  . Anesthesia problems Neg Hx   . Heart disease Father   . Cancer Sister     breast ca  . Cancer Brother     prostate ca   History  Substance Use Topics  . Smoking status: Former Smoker -- 1.50 packs/day for 25 years    Quit date: 01/11/1990  . Smokeless tobacco: Never Used  . Alcohol Use: No    Review of Systems  Constitutional: Positive for chills. Negative for fever.  Respiratory: Negative for shortness of breath.   Gastrointestinal: Positive for nausea, vomiting, abdominal pain and constipation. Negative for diarrhea and anorexia.  All other systems reviewed and are negative.     Allergies  Review of patient's allergies indicates no known allergies.  Home Medications   Prior to Admission medications   Medication Sig Start Date End Date Taking? Authorizing Provider  aspirin EC 81 MG tablet Take 81 mg by mouth every morning.   Yes Historical Provider, MD  atorvastatin (LIPITOR)  10 MG tablet Take 5 mg by mouth every evening.    Yes Historical Provider, MD  cycloSPORINE (RESTASIS) 0.05 % ophthalmic emulsion Place 1 drop into both eyes 2 (two) times daily.   Yes Historical Provider, MD  diltiazem (CARDIZEM CD) 120 MG 24 hr capsule Take 1 capsule (120 mg total) by mouth every morning. 10/11/13  Yes [name], MD  docusate sodium (COLACE) 100 MG capsule Take 100 mg by mouth 3 (three) times daily as needed for mild constipation.    Yes Historical Provider, MD  lidocaine-prilocaine (EMLA) cream Apply 1 application topically as needed. 06/28/13  Yes [name], MD  metoprolol succinate  (TOPROL-XL) 25 MG 24 hr tablet Take 25 mg by mouth every morning.   Yes Historical Provider, MD  Multiple Vitamin (MULTIVITAMIN WITH MINERALS) TABS tablet Take 1 tablet by mouth daily.   Yes Historical Provider, MD  omeprazole (PRILOSEC) 40 MG capsule Take 40 mg by mouth daily.   Yes Historical Provider, MD  promethazine (PHENERGAN) 25 MG tablet Take 1 tablet (25 mg total) by mouth every 6 (six) hours as needed for nausea. Patient taking differently: Take 6.25-25 mg by mouth every 6 (six) hours as needed for nausea.  06/28/13  Yes [name], MD  ciprofloxacin (CIPRO) 500 MG tablet Take 1 tablet (500 mg total) by mouth 2 (two) times daily. 11/27/13   Theodis Blaze, MD  metroNIDAZOLE (FLAGYL) 500 MG tablet Take 1 tablet (500 mg total) by mouth 3 (three) times daily. 11/27/13   Theodis Blaze, MD  ondansetron (ZOFRAN) 8 MG tablet Take 1 tablet (8 mg total) by mouth every 8 (eight) hours as needed for nausea. 06/28/13   [name], MD  oxyCODONE (OXY IR/ROXICODONE) 5 MG immediate release tablet Take 1 tablet (5 mg total) by mouth every 4 (four) hours as needed for severe pain. 11/27/13   Theodis Blaze, MD   BP 106/69 mmHg  Pulse 77  Temp(Src) 97.4 F (36.3 C) (Oral)  Resp 16  SpO2 97% Physical Exam  Constitutional: He is oriented to person, place, and time. He appears well-developed and well-nourished.  HENT:  Head: Normocephalic and atraumatic.  Eyes: Conjunctivae and EOM are normal.  Neck: Normal range of motion. Neck supple.  Cardiovascular: Normal rate, regular rhythm and normal heart sounds.   Pulmonary/Chest: Effort normal and breath sounds normal. No respiratory distress.  Abdominal: He exhibits no distension. There is generalized tenderness. There is no rebound and no guarding.  Musculoskeletal: Normal range of motion.  Neurological: He is alert and oriented to person, place, and time.  Skin: Skin is warm and dry.  Vitals reviewed.   ED Course  Procedures (including critical care  time) Labs Review Labs Reviewed  CBC WITH DIFFERENTIAL - Abnormal; Notable for the following:    Neutrophils Relative % 79 (*)    Lymphocytes Relative 9 (*)    All other components within normal limits  COMPREHENSIVE METABOLIC PANEL - Abnormal; Notable for the following:    Potassium 3.5 (*)    Glucose, Bld 154 (*)    Albumin 3.3 (*)    GFR calc non Af Amer 83 (*)    All other components within normal limits  URINALYSIS, ROUTINE W REFLEX MICROSCOPIC - Abnormal; Notable for the following:    Specific Gravity, Urine >1.046 (*)    All other components within normal limits  BASIC METABOLIC PANEL - Abnormal; Notable for the following:    Potassium 3.6 (*)    Glucose,  Bld 100 (*)    Calcium 8.1 (*)    GFR calc non Af Amer 85 (*)    All other components within normal limits  CBC - Abnormal; Notable for the following:    HCT 37.1 (*)    Platelets 127 (*)    All other components within normal limits  LACTIC ACID, PLASMA  CBC WITH DIFFERENTIAL  CBC  I-STAT TROPOININ, ED    Imaging Review Dg Chest 2 View  11/26/2013   CLINICAL DATA:  Nausea and vomiting for 2 days  EXAM: CHEST  2 VIEW  COMPARISON:  09/24/2013  FINDINGS: Cardiac shadow is within normal limits. A hiatal hernia is again noted. A right chest wall port is seen with catheter tip at the cavoatrial junction. Postoperative changes are noted on the right. The lungs are well-aerated without focal infiltrate or sizable effusion.  IMPRESSION: No acute abnormality noted.   Electronically Signed   By: Inez Catalina M.D.   On: 11/26/2013 22:21   Ct Abdomen Pelvis W Contrast  11/26/2013   CLINICAL DATA:  Lung cancer and lymphoma, ongoing chemotherapy, no bowel movement for 6 days, abdominal pain, vomiting, cramping, history diverticulitis, atrial fibrillation  EXAM: CT ABDOMEN AND PELVIS WITH CONTRAST  TECHNIQUE: Multidetector CT imaging of the abdomen and pelvis was performed using the standard protocol following bolus administration of  intravenous contrast. Sagittal and coronal MPR images reconstructed from axial data set.  CONTRAST:  Dilute oral contrast.  100 cc Omnipaque 300 IV  COMPARISON:  PET-CT 09/24/2013  FINDINGS: Minimal atelectasis at LEFT lower lobe.  Intrahepatic biliary dilatation post cholecystectomy with 11 mm CBD not significantly changed.  11 x 10 mm low to intermediate attenuation lesion lateral segment LEFT lobe stable since 2013.  Small LEFT renal cysts stable.  Liver, spleen, pancreas, kidneys, and adrenal glands otherwise normal.  Infiltration of fat planes at the region of the celiac axis and SMA origin unchanged.  Moderate-sized hiatal hernia.  Appendix not visualized.  Diverticulosis of descending and sigmoid colon with diffuse wall thickening and mild pericolic inflammatory changes of the sigmoid colon.  Hyperemia of sigmoid mesocolon.  Additionally, wall of remaining sigmoid colon and rectum appears thickened.  It is uncertain whether the changes represent sigmoid diverticulitis or diffuse colitis of the rectosigmoid colon in a patient with coexistent chronic diverticular disease.  No evidence of perforation or abscess.  Stomach, small bowel loops and proximal colon otherwise unremarkable.  Scattered atherosclerotic calcifications.  Unremarkable bladder with mild prostatic enlargement.  No mass, adenopathy, free fluid, or free air.  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  Wall thickening of the sigmoid colon and rectum, with minimal pericolic infiltrative changes; findings could be due to sigmoid diverticulitis or diffuse colitis of the rectosigmoid colon in patient with chronic diverticular disease.  Involvement of the distal sigmoid colon and rectum favors distal colitis, which could be due to infection or inflammatory bowel disease, less likely ischemia by distribution.  No evidence of perforation, obstruction or abscess.    Electronically Signed   By: Lavonia Dana M.D.   On: 11/26/2013 22:47     EKG Interpretation   Date/Time:  Monday November 26 2013 19:30:20 EST Ventricular Rate:  133 PR Interval:    QRS Duration: 74 QT Interval:  298 QTC Calculation: 443 R Axis:   62 Text Interpretation:  Atrial fibrillation with rapid ventricular response  Nonspecific repol abnormality, diffuse leads Confirmed by Debby Freiberg  (  [id number]) on 11/26/2013 9:23:52 PM      MDM   Final diagnoses:  Abdominal pain  Vomiting    68 y.o. male with pertinent PMH of B Cell nonhodgkins lymphoma presents with abd pain, n/v, constipation, dyspnea x 1 day.  No fevers at home.  Physical exam and vitals as above.  Labs and imaging as above.  CT scan with diffuse colitis.  Consulted hospitalist for admission.    1. Abdominal pain   2. Vomiting         Debby Freiberg, MD 11/27/13 [phone]

## 2013-11-26 NOTE — ED Notes (Signed)
Patient presents via POV for abdominal pain x2 days, N/V x1 day. Patient c/o lower mid abdominal pain 5/10, described as sore, tender with palpation to same, non radiating. Patient has not had a "normal" BM x6 days. Patient has had minimal relief with enemas and stool softeners. Patient last chemo treatment was 11/22/13. A&O x4.

## 2013-11-26 NOTE — Telephone Encounter (Signed)
Wife states patient has not had BM since Tueday. 11/10. Severe stomach pain and has been vomiting. Dr Alvy Bimler recommends using Fleets enema and drink clear liquids. Instructed wife to call me after lunch with an update.

## 2013-11-27 ENCOUNTER — Other Ambulatory Visit: Payer: Self-pay | Admitting: Hematology and Oncology

## 2013-11-27 ENCOUNTER — Other Ambulatory Visit: Payer: Self-pay | Admitting: Family Medicine

## 2013-11-27 ENCOUNTER — Telehealth: Payer: Self-pay | Admitting: *Deleted

## 2013-11-27 ENCOUNTER — Encounter (HOSPITAL_COMMUNITY): Payer: Self-pay | Admitting: *Deleted

## 2013-11-27 DIAGNOSIS — C8510 Unspecified B-cell lymphoma, unspecified site: Secondary | ICD-10-CM

## 2013-11-27 DIAGNOSIS — I4891 Unspecified atrial fibrillation: Secondary | ICD-10-CM

## 2013-11-27 DIAGNOSIS — K529 Noninfective gastroenteritis and colitis, unspecified: Principal | ICD-10-CM

## 2013-11-27 DIAGNOSIS — E785 Hyperlipidemia, unspecified: Secondary | ICD-10-CM | POA: Diagnosis present

## 2013-11-27 DIAGNOSIS — D696 Thrombocytopenia, unspecified: Secondary | ICD-10-CM

## 2013-11-27 LAB — URINALYSIS, ROUTINE W REFLEX MICROSCOPIC
BILIRUBIN URINE: NEGATIVE
Glucose, UA: NEGATIVE mg/dL
Hgb urine dipstick: NEGATIVE
KETONES UR: NEGATIVE mg/dL
Leukocytes, UA: NEGATIVE
Nitrite: NEGATIVE
PH: 6 (ref 5.0–8.0)
Protein, ur: NEGATIVE mg/dL
Specific Gravity, Urine: >1.046 — ABNORMAL HIGH (ref 1.005–1.030)
UROBILINOGEN UA: 0.2 mg/dL (ref 0.0–1.0)

## 2013-11-27 LAB — CBC
HEMATOCRIT: 37.1 % — AB (ref 39.0–52.0)
HEMOGLOBIN: 13 g/dL (ref 13.0–17.0)
MCH: 30.4 pg (ref 26.0–34.0)
MCHC: 35 g/dL (ref 30.0–36.0)
MCV: 86.9 fL (ref 78.0–100.0)
Platelets: 127 10*3/uL — ABNORMAL LOW (ref 150–400)
RBC: 4.27 MIL/uL (ref 4.22–5.81)
RDW: 12.9 % (ref 11.5–15.5)
WBC: 5.2 10*3/uL (ref 4.0–10.5)

## 2013-11-27 LAB — BASIC METABOLIC PANEL
Anion gap: 12 (ref 5–15)
BUN: 16 mg/dL (ref 6–23)
CHLORIDE: 102 meq/L (ref 96–112)
CO2: 23 meq/L (ref 19–32)
CREATININE: 0.91 mg/dL (ref 0.50–1.35)
Calcium: 8.1 mg/dL — ABNORMAL LOW (ref 8.4–10.5)
GFR calc Af Amer: >90 mL/min (ref >90)
GFR calc non Af Amer: 85 mL/min — ABNORMAL LOW (ref >90)
Glucose, Bld: 100 mg/dL — ABNORMAL HIGH (ref 70–99)
Potassium: 3.6 mEq/L — ABNORMAL LOW (ref 3.7–5.3)
Sodium: 137 mEq/L (ref 137–147)

## 2013-11-27 LAB — LACTIC ACID, PLASMA: LACTIC ACID, VENOUS: 0.7 mmol/L (ref 0.5–2.2)

## 2013-11-27 MED ORDER — SODIUM CHLORIDE 0.9 % IJ SOLN: 10.0000 mL | Freq: Two times a day (BID) | INTRAMUSCULAR | Status: DC

## 2013-11-27 MED ORDER — DILTIAZEM HCL ER COATED BEADS 120 MG PO CP24
120.0000 mg | ORAL_CAPSULE | Freq: Every morning | ORAL | Status: DC
  Administered 2013-11-27: 120 mg via ORAL
  Filled 2013-11-27: qty 1

## 2013-11-27 MED ORDER — ONDANSETRON HCL 4 MG PO TABS: 4.0000 mg | ORAL_TABLET | Freq: Four times a day (QID) | ORAL | Status: DC | PRN

## 2013-11-27 MED ORDER — CIPROFLOXACIN IN D5W 400 MG/200ML IV SOLN
400.0000 mg | Freq: Two times a day (BID) | INTRAVENOUS | Status: DC
  Administered 2013-11-27: 400 mg via INTRAVENOUS
  Filled 2013-11-27 (×2): qty 200

## 2013-11-27 MED ORDER — ATORVASTATIN CALCIUM 10 MG PO TABS
5.0000 mg | ORAL_TABLET | Freq: Every evening | ORAL | Status: DC
  Filled 2013-11-27: qty 0.5

## 2013-11-27 MED ORDER — METRONIDAZOLE 500 MG PO TABS: 500.0000 mg | ORAL_TABLET | Freq: Three times a day (TID) | ORAL | Status: DC

## 2013-11-27 MED ORDER — ASPIRIN EC 81 MG PO TBEC
81.0000 mg | DELAYED_RELEASE_TABLET | Freq: Every morning | ORAL | Status: DC
  Administered 2013-11-27: 81 mg via ORAL
  Filled 2013-11-27: qty 1

## 2013-11-27 MED ORDER — ENOXAPARIN SODIUM 40 MG/0.4ML ~~LOC~~ SOLN
40.0000 mg | SUBCUTANEOUS | Status: DC
  Administered 2013-11-27: 40 mg via SUBCUTANEOUS
  Filled 2013-11-27: qty 0.4

## 2013-11-27 MED ORDER — SODIUM CHLORIDE 0.9 % IJ SOLN
10.0000 mL | INTRAMUSCULAR | Status: DC | PRN
  Administered 2013-11-27: 10 mL

## 2013-11-27 MED ORDER — ONDANSETRON HCL 4 MG PO TABS: 8.0000 mg | ORAL_TABLET | Freq: Three times a day (TID) | ORAL | Status: DC | PRN

## 2013-11-27 MED ORDER — SODIUM CHLORIDE 0.9 % IV SOLN
INTRAVENOUS | Status: DC
  Administered 2013-11-27: 02:00:00 via INTRAVENOUS

## 2013-11-27 MED ORDER — PROMETHAZINE HCL 25 MG PO TABS: 25.0000 mg | ORAL_TABLET | Freq: Four times a day (QID) | ORAL | Status: DC | PRN

## 2013-11-27 MED ORDER — PANTOPRAZOLE SODIUM 40 MG PO TBEC
40.0000 mg | DELAYED_RELEASE_TABLET | Freq: Every day | ORAL | Status: DC
  Administered 2013-11-27: 40 mg via ORAL
  Filled 2013-11-27: qty 1

## 2013-11-27 MED ORDER — ACETAMINOPHEN 650 MG RE SUPP: 650.0000 mg | Freq: Four times a day (QID) | RECTAL | Status: DC | PRN

## 2013-11-27 MED ORDER — OXYCODONE HCL 5 MG PO TABS
5.0000 mg | ORAL_TABLET | ORAL | Status: DC | PRN
Start: 2013-11-27 — End: 2013-11-27

## 2013-11-27 MED ORDER — HEPARIN SOD (PORK) LOCK FLUSH 100 UNIT/ML IV SOLN
500.0000 [IU] | INTRAVENOUS | Status: DC | PRN
  Administered 2013-11-27: 500 [IU]
  Filled 2013-11-27: qty 5

## 2013-11-27 MED ORDER — CIPROFLOXACIN HCL 500 MG PO TABS: 500.0000 mg | ORAL_TABLET | Freq: Two times a day (BID) | ORAL | Status: DC

## 2013-11-27 MED ORDER — METOPROLOL SUCCINATE ER 25 MG PO TB24
25.0000 mg | ORAL_TABLET | Freq: Every morning | ORAL | Status: DC
  Administered 2013-11-27: 25 mg via ORAL
  Filled 2013-11-27: qty 1

## 2013-11-27 MED ORDER — ONDANSETRON HCL 4 MG/2ML IJ SOLN: 4.0000 mg | Freq: Four times a day (QID) | INTRAMUSCULAR | Status: DC | PRN

## 2013-11-27 MED ORDER — ACETAMINOPHEN 325 MG PO TABS
650.0000 mg | ORAL_TABLET | Freq: Four times a day (QID) | ORAL | Status: DC | PRN
Start: 2013-11-27 — End: 2013-11-27

## 2013-11-27 MED ORDER — OXYCODONE HCL 5 MG PO TABS: 5.0000 mg | ORAL_TABLET | ORAL | Status: DC | PRN

## 2013-11-27 MED ORDER — HEPARIN SOD (PORK) LOCK FLUSH 100 UNIT/ML IV SOLN
500.0000 [IU] | INTRAVENOUS | Status: DC
  Filled 2013-11-27: qty 5

## 2013-11-27 MED ORDER — METRONIDAZOLE IN NACL 5-0.79 MG/ML-% IV SOLN
500.0000 mg | Freq: Three times a day (TID) | INTRAVENOUS | Status: DC
  Administered 2013-11-27 (×2): 500 mg via INTRAVENOUS
  Filled 2013-11-27 (×3): qty 100

## 2013-11-27 MED ORDER — HYDROMORPHONE HCL 1 MG/ML IJ SOLN: 1.0000 mg | INTRAMUSCULAR | Status: DC | PRN

## 2013-11-27 MED ORDER — CYCLOSPORINE 0.05 % OP EMUL
1.0000 [drp] | Freq: Two times a day (BID) | OPHTHALMIC | Status: DC
  Administered 2013-11-27: 1 [drp] via OPHTHALMIC
  Filled 2013-11-27 (×3): qty 1

## 2013-11-27 MED ORDER — METRONIDAZOLE 500 MG PO TABS
500.0000 mg | ORAL_TABLET | Freq: Three times a day (TID) | ORAL | Status: DC
Start: 2013-11-27 — End: 2013-12-18

## 2013-11-27 NOTE — H&P (Signed)
[facility] History and Physical  Shaquan Missey RFX:[id number] DOB: 1945-01-18 DOA: 11/26/2013  Referring 52 physician. PCP: Lysbeth Penner, FNP  Chief Complaint: Abdominal pain.  HPI: Anthony Kelly is a 68 y.o. male with a history of low-grade B-cell lymphoma on chemotherapy last chemotherapy was last week, atrial fibrillation, history of pericarditis, hyperlipidemia presented to the ER because of left lower quadrant abdominal pain with nausea and vomiting. Patient has been having constipation since last Tuesday one week ago and had chemotherapy on Wednesday and Thursday following which patient has been having increasing abdominal pain which patient thought it would get better but started developing nausea vomiting. Patient has some enema at home and had 2 bowel movements but since left lower quadrant pain was persistent patient came to the ER. CT abdomen and pelvis shows colitis/diverticulitis involving the rectosigmoid area and patient has been admitted for further management.When patient initially came patient was found to be in A. Fib with RVR which resolved by itself.   Review of Systems: As presented in the history of presenting illness, rest negative.  Past Medical History  Diagnosis Date  . PSVT (paroxysmal supraventricular tachycardia)   . Arthritis   . 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 Metastasis to lymph nodes dx'd 03/2011    lt submandibular ln  . Atrial fibrillation     a. Isolated episode in the setting of acute pericarditis 04/2013. Was not placed on anticoag.  Marland Kitchen Acute pericarditis     a. 04/2013 -adm with CP, elevated CRP. H/o coronary artery calcification on  prior CT but nuc was negative, EF 71%.  . Pain in joint, pelvic region and thigh 08/01/2013  . Diverticulitis 08/16/2013   Past Surgical History  Procedure Laterality Date  . Lung lobectomy      right side  . Cholecystectomy    . Lymph node biopsy      in groin with removal  . Meniscus repair      right knee  . Vein ligation and stripping      right leg  . Tonsillectomy      as a child  . Submandibular gland excision  04/2011  . Submandibular gland excision  04/30/2011    Procedure: EXCISION SUBMANDIBULAR GLAND;  Surgeon: Jerrell Belfast, MD;  Location: [facility];  Service: ENT;  Laterality: Left;  WITH DIAGNOSTIC BIOPSY  . Exploratory laparotomy     Social History:  reports that he quit smoking about 23 years ago. He has never used smokeless tobacco. He reports that he does not drink alcohol or use illicit drugs. Where does patient live home. Can patient participate in ADLs? Yes.  No Known Allergies  Family History:  Family History  Problem Relation Age of Onset  . Anesthesia problems Neg Hx   . Heart disease Father   . Cancer Sister     breast ca  . Cancer Brother     prostate ca      Prior to Admission medications   Medication Sig Start Date End Date Taking? Authorizing Provider  aspirin EC 81 MG tablet Take 81 mg by mouth every morning.   Yes Historical Provider, MD  atorvastatin (LIPITOR) 10 MG tablet Take 5  mg by mouth every evening.    Yes Historical Provider, MD  cycloSPORINE (RESTASIS) 0.05 % ophthalmic emulsion Place 1 drop into both eyes 2 (two) times daily.   Yes Historical Provider, MD  diltiazem (CARDIZEM CD) 120 MG 24 hr capsule Take 1 capsule (120 mg total) by mouth every morning. 10/11/13  Yes [name], MD  docusate sodium (COLACE) 100 MG capsule Take 100 mg by mouth 3 (three) times daily as needed for mild constipation.    Yes Historical Provider, MD  lidocaine-prilocaine (EMLA) cream Apply 1 application topically as needed. 06/28/13  Yes [name], MD   metoprolol succinate (TOPROL-XL) 25 MG 24 hr tablet Take 25 mg by mouth every morning.   Yes Historical Provider, MD  Multiple Vitamin (MULTIVITAMIN WITH MINERALS) TABS tablet Take 1 tablet by mouth daily.   Yes Historical Provider, MD  omeprazole (PRILOSEC) 40 MG capsule Take 40 mg by mouth daily.   Yes Historical Provider, MD  promethazine (PHENERGAN) 25 MG tablet Take 1 tablet (25 mg total) by mouth every 6 (six) hours as needed for nausea. Patient taking differently: Take 6.25-25 mg by mouth every 6 (six) hours as needed for nausea.  06/28/13  Yes [name], MD  ondansetron (ZOFRAN) 8 MG tablet Take 1 tablet (8 mg total) by mouth every 8 (eight) hours as needed for nausea. 06/28/13   [name], MD  oxyCODONE (OXY IR/ROXICODONE) 5 MG immediate release tablet Take 1 tablet (5 mg total) by mouth every 4 (four) hours as needed for severe pain. Patient taking differently: Take 5 mg by mouth 4 (four) times daily as needed for severe pain.  11/05/13   [name], MD    Physical Exam: Filed Vitals:   11/26/13 2311 11/26/13 2359 11/27/13 0000 11/27/13 0034  BP: 108/79 91/59 91/59  100/75  Pulse: 79 75 75 74  Temp:    97.8 F (36.6 C)  TempSrc:      Resp: 14 16 16 16   SpO2: 94% 95% 93% 99%     General:  Well-developed and nourished.  Eyes: anicteric no pallor.  ENT: no discharge from the ears eyes nose and mouth.  Neck: no mass felt.  Cardiovascular: S1-S2 heard.  Respiratory: no rhonchi or crepitations.  Abdomen: soft nontender bowel sounds present.  Skin: no rash.  Musculoskeletal: no edema.  Psychiatric: appears normal.  Neurologic: alert awake oriented to time place and person. Moves all extremities.  Labs on Admission:  Basic Metabolic Panel:  Recent Labs Lab 11/21/13 0838 11/26/13 2026  NA 139 139  K 4.1 3.5*  CL  --  100  CO2 25 24  GLUCOSE 125 154*  BUN 14.6 17  CREATININE 1.0 0.97  CALCIUM 9.1 9.1   Liver Function Tests:  Recent Labs Lab  11/21/13 0838 11/26/13 2026  AST 19 17  ALT 21 21  ALKPHOS 71 67  BILITOT 0.51 0.9  PROT 6.2* 6.6  ALBUMIN 3.6 3.3*   No results for input(s): LIPASE, AMYLASE in the last 168 hours. No results for input(s): AMMONIA in the last 168 hours. CBC:  Recent Labs Lab 11/21/13 0839 11/26/13 2026  WBC 7.0 8.4  NEUTROABS 4.3 6.6  HGB 14.0 15.1  HCT 41.1 42.8  MCV 88.4 85.9  PLT 145 156   Cardiac Enzymes: No results for input(s): CKTOTAL, CKMB, CKMBINDEX, TROPONINI in the last 168 hours.  BNP (last 3 results) No results for input(s): PROBNP in the last 8760 hours. CBG: No results for input(s): GLUCAP in  the last 168 hours.  Radiological Exams on Admission: Dg Chest 2 View  11/26/2013   CLINICAL DATA:  Nausea and vomiting for 2 days  EXAM: CHEST  2 VIEW  COMPARISON:  09/24/2013  FINDINGS: Cardiac shadow is within normal limits. A hiatal hernia is again noted. A right chest wall port is seen with catheter tip at the cavoatrial junction. Postoperative changes are noted on the right. The lungs are well-aerated without focal infiltrate or sizable effusion.  IMPRESSION: No acute abnormality noted.   Electronically Signed   By: Inez Catalina M.D.   On: 11/26/2013 22:21   Ct Abdomen Pelvis W Contrast  11/26/2013   CLINICAL DATA:  Lung cancer and lymphoma, ongoing chemotherapy, no bowel movement for 6 days, abdominal pain, vomiting, cramping, history diverticulitis, atrial fibrillation  EXAM: CT ABDOMEN AND PELVIS WITH CONTRAST  TECHNIQUE: Multidetector CT imaging of the abdomen and pelvis was performed using the standard protocol following bolus administration of intravenous contrast. Sagittal and coronal MPR images reconstructed from axial data set.  CONTRAST:  Dilute oral contrast.  100 cc Omnipaque 300 IV  COMPARISON:  PET-CT 09/24/2013  FINDINGS: Minimal atelectasis at LEFT lower lobe.  Intrahepatic biliary dilatation post cholecystectomy with 11 mm CBD not significantly changed.  11 x 10 mm  low to intermediate attenuation lesion lateral segment LEFT lobe stable since 2013.  Small LEFT renal cysts stable.  Liver, spleen, pancreas, kidneys, and adrenal glands otherwise normal.  Infiltration of fat planes at the region of the celiac axis and SMA origin unchanged.  Moderate-sized hiatal hernia.  Appendix not visualized.  Diverticulosis of descending and sigmoid colon with diffuse wall thickening and mild pericolic inflammatory changes of the sigmoid colon.  Hyperemia of sigmoid mesocolon.  Additionally, wall of remaining sigmoid colon and rectum appears thickened.  It is uncertain whether the changes represent sigmoid diverticulitis or diffuse colitis of the rectosigmoid colon in a patient with coexistent chronic diverticular disease.  No evidence of perforation or abscess.  Stomach, small bowel loops and proximal colon otherwise unremarkable.  Scattered atherosclerotic calcifications.  Unremarkable bladder with mild prostatic enlargement.  No mass, adenopathy, free fluid, or free air.  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  Wall thickening of the sigmoid colon and rectum, with minimal pericolic infiltrative changes; findings could be due to sigmoid diverticulitis or diffuse colitis of the rectosigmoid colon in patient with chronic diverticular disease.  Involvement of the distal sigmoid colon and rectum favors distal colitis, which could be due to infection or inflammatory bowel disease, less likely ischemia by distribution.  No evidence of perforation, obstruction or abscess.   Electronically Signed   By: Lavonia Dana M.D.   On: 11/26/2013 22:47    EKG: A. Fib with RVR.  Assessment/Plan Principal Problem:   Colitis Active Problems:   Low grade B-cell lymphoma   Atrial fibrillation with RVR   Hyperlipidemia   1. Colitis/diverticulitis involving the rectosigmoid area - could be infectious or ischemic or  related to chemotherapy. Check lactic acid levels. Patient has been placed on clear liquids and Cipro and Flagyl. Further recommendation based on potatoes and labs ordered. Check stool studies. 2. A. Fib with RVR - presently rate has improved. Continue diltiazem. Patient is on aspirin. 3. Hyperlipidemia on statins. 4. Low-grade B-cell lymphoma - has received recently chemotherapy last week. Further recommendations per oncologist.    Code Status: full code.  Family Communication: patient's wife at the bedside.  Disposition Plan: [id number].    Andres Vest N. [facility] Pager [phone].  If 7PM-7AM, please contact night-coverage [email or web address] Password [email or web address] 11/27/2013, 1:21 AM

## 2013-11-27 NOTE — Progress Notes (Signed)
Anthony Kelly   DOB:06-15-1945   LN#:[id number]   DEY#:[id number]  Patient Care Team: Lysbeth Penner, FNP as PCP - General (Family Medicine) Fay Records, MD as Attending Physician (Cardiology) I have seen the patient, examined him and edited the notes as follows  Subjective: Anthony Kelly is a 68 year old man with a history of low grade B-cell lymphoma as detailed below,s/p cycle 6 of 6 on 11/11 and 11/12 with Rituxan and Treanda, admitted on 11/16 with left lower quadrant abdominal pain, nausea and vomiting. He also had some episodes of non bloody diarrhea, last event today. this is following an episode of constipation, with no bowel movement from 11/10-16 for which he used laxatives. Appetite was normal upon admission. He denies any shortness of breath or chest pain. He did have an episode of Atrial Fibrillation, now rate controlled. He denies any joint pain. He denies any bleeding issues such as hemoptysis, hematemesis or hematuria.  CT of the abdomen and pelvis revealed colitis versus diverticulitis involving the rectosigmoid area.Workup by admitting team is in progress. He is currently on Cipro and Flagyl, IV fluids. Stool tests and cultures are pending. We have been kindly informed of the patient's admission.    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   12/12/2003 - 06/01/2004 Chemotherapy He was treated with R. CHOP chemotherapy which show complete remission. The number of cycles of R. CHOP chemotherapy was unknown.   12/19/2006 Surgery Lung resection show follicular lymphoma.   01/02/2007 - 09/01/2008 Chemotherapy The patient was treated with single agent rituximab  alone.   01/19/2007 Bone Marrow Biopsy Bone marrow biopsy was negative.   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   05/08/2013 - 05/11/2013 Hospital Admission The patient was admitted to the hospital for management of pericarditis. CT scan showed extensive lymphadenopathy.   06/07/2013 Imaging PET/CT scan showed extensive lymphadenopathy   06/25/2013 Procedure He has placement of Infuse-a-Port.   06/28/2013 Bone Marrow Biopsy Bone marrow biopsy is positive for lymphoma involvement.   07/04/2013 -  Chemotherapy He is treated with cycle 1 of bendamustine with rituximab.   09/24/2013 Imaging Repeat PET scan show complete remission    Scheduled Meds: . aspirin EC  81 mg Oral q morning - 10a  . atorvastatin  5 mg Oral QPM  . ciprofloxacin  400 mg Intravenous Q12H  . cycloSPORINE  1 drop Both Eyes BID  . diltiazem  120 mg Oral q morning - 10a  . enoxaparin (LOVENOX) injection  40 mg Subcutaneous Q24H  . metoprolol succinate  25 mg Oral q morning - 10a  . metronidazole  500 mg Intravenous Q8H  . pantoprazole  40 mg Oral Daily   Continuous Infusions: . sodium chloride 75 mL/hr at 11/27/13 0201   PRN Meds:acetaminophen **OR** acetaminophen, HYDROmorphone (DILAUDID) injection, ondansetron **OR** ondansetron (ZOFRAN) IV, oxyCODONE, promethazine   Objective:  Filed Vitals:   11/27/13 0627  BP: 97/59  Pulse: 72  Temp: 97.4 F (36.3 C)  Resp: 16      Intake/Output Summary (Last 24 hours) at 11/27/13 0848 Last data filed at 11/27/13 0600  Gross per 24 hour  Intake 598.75 ml  Output  500 ml  Net  98.75 ml    ECOG PERFORMANCE STATUS:2  GENERAL:alert, no distress and comfortable SKIN: skin color, texture, turgor are normal, no rashes or significant lesions EYES: normal, conjunctiva are pink and non-injected, sclera clear OROPHARYNX: no exudate, no erythema and lips, buccal mucosa, and tongue normal  NECK: supple, thyroid  normal size, non-tender, without nodularity LYMPH:  no palpable lymphadenopathy in the cervical, axillary or inguinal LUNGS: clear to auscultation and percussion with normal breathing effort HEART: regular rate & rhythm and no murmurs and no lower extremity edema ABDOMEN:abdomen soft, tender to palpation on the left lower quadrant, and active bowel sounds Musculoskeletal:no cyanosis of digits and no clubbing  PSYCH: alert & oriented x 3 with fluent speech NEURO: no focal motor/sensory deficits    CBG (last 3)  No results for input(s): GLUCAP in the last 72 hours.   Labs:   Recent Labs Lab 11/21/13 0839 11/26/13 2026 11/27/13 0250  WBC 7.0 8.4 5.2  HGB 14.0 15.1 13.0  HCT 41.1 42.8 37.1*  PLT 145 156 127*  MCV 88.4 85.9 86.9  MCH 30.1 30.3 30.4  MCHC 34.0 35.3 35.0  RDW 13.1 12.8 12.9  LYMPHSABS 1.4 0.7  --   MONOABS 0.9 1.0  --   EOSABS 0.4 0.1  --   BASOSABS 0.0 0.0  --      Chemistries:    Recent Labs Lab 11/21/13 0838 11/26/13 2026 11/27/13 0250  NA 139 139 137  K 4.1 3.5* 3.6*  CL  --  100 102  CO2 _0 GLUCOSE 125 154* 100*  BUN 14._1 CREATININE 1.0 0.97 0.91  CALCIUM 9.1 9.1 8.1*  AST 19 17  --   ALT 21 21  --   ALKPHOS 71 67  --   BILITOT 0.51 0.9  --     GFR Estimated Creatinine Clearance: 85.3 mL/min (by C-G formula based on Cr of 0.91).  Liver Function Tests:  Recent Labs Lab 11/21/13 0838 11/26/13 2026  AST 19 17  ALT 21 21  ALKPHOS 71 67  BILITOT 0.51 0.9  PROT 6.2* 6.6  ALBUMIN 3.6 3.3*   No results for input(s): LIPASE, AMYLASE in the last 168 hours. No results for input(s): AMMONIA in the last 168 hours.  Urine Studies     Component Value Date/Time   COLORURINE YELLOW 11/26/2013 2334   APPEARANCEUR CLEAR 11/26/2013 2334   LABSPEC >1.046* 11/26/2013 2334   PHURINE 6.0 11/26/2013 [place] 11/26/2013 2334   HGBUR NEGATIVE 11/26/2013 2334   BILIRUBINUR NEGATIVE 11/26/2013 [place] 11/26/2013 2334   PROTEINUR NEGATIVE 11/26/2013 2334   UROBILINOGEN 0.2 11/26/2013 2334   NITRITE NEGATIVE 11/26/2013 2334   LEUKOCYTESUR NEGATIVE 11/26/2013 2334      Imaging Studies: I reviewed the imaging studies Dg Chest 2 View  11/26/2013   CLINICAL DATA:  Nausea and vomiting for 2 days  EXAM: CHEST  2 VIEW  COMPARISON:  09/24/2013  FINDINGS: Cardiac shadow is within normal limits. A hiatal hernia is again noted. A right chest wall port is seen with catheter tip at the cavoatrial junction. Postoperative changes are noted on the right. The lungs are well-aerated without focal infiltrate or sizable effusion.  IMPRESSION: No acute abnormality noted.   Electronically Signed   By: Inez Catalina M.D.   On: 11/26/2013 22:21   Ct Abdomen Pelvis W Contrast  11/26/2013   CLINICAL DATA:  Lung cancer and  lymphoma, ongoing chemotherapy, no bowel movement for 6 days, abdominal pain, vomiting, cramping, history diverticulitis, atrial fibrillation  EXAM: CT ABDOMEN AND PELVIS WITH CONTRAST  TECHNIQUE: Multidetector CT imaging of the abdomen and pelvis was performed using the standard protocol following bolus administration of intravenous contrast. Sagittal and coronal MPR images reconstructed from axial data set.  CONTRAST:  Dilute oral contrast.  100 cc Omnipaque 300 IV  COMPARISON:  PET-CT 09/24/2013  FINDINGS: Minimal atelectasis at LEFT lower lobe.  Intrahepatic biliary dilatation post cholecystectomy with 11 mm CBD not significantly changed.  11 x 10 mm low to intermediate attenuation lesion lateral segment LEFT lobe stable since 2013.  Small LEFT renal cysts stable.  Liver, spleen, pancreas, kidneys, and adrenal glands otherwise normal.  Infiltration of fat planes at the region of the celiac axis and SMA origin unchanged.  Moderate-sized hiatal hernia.  Appendix not visualized.  Diverticulosis of descending and sigmoid colon with diffuse wall thickening and mild pericolic inflammatory changes of  the sigmoid colon.  Hyperemia of sigmoid mesocolon.  Additionally, wall of remaining sigmoid colon and rectum appears thickened.  It is uncertain whether the changes represent sigmoid diverticulitis or diffuse colitis of the rectosigmoid colon in a patient with coexistent chronic diverticular disease.  No evidence of perforation or abscess.  Stomach, small bowel loops and proximal colon otherwise unremarkable.  Scattered atherosclerotic calcifications.  Unremarkable bladder with mild prostatic enlargement.  No mass, adenopathy, free fluid, or free air.  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  Wall thickening of the sigmoid colon and rectum, with minimal pericolic infiltrative changes; findings could be due to sigmoid diverticulitis or diffuse colitis of the rectosigmoid colon in patient with chronic diverticular disease.  Involvement of the distal sigmoid colon and rectum favors distal colitis, which could be due to infection or inflammatory bowel disease, less likely ischemia by distribution.  No evidence of perforation, obstruction or abscess.   Electronically Signed   By: Lavonia Dana M.D.   On: 11/26/2013 22:47    Assessment/Plan: 68 y.o.  Low grade B-cell lymphoma Overall, he tolerated chemotherapy well apart from minor, expected side effects. Last cycle given on 11/11 (6/6),with Treanda and Rituxan To restage him with another PET CT scan next month. If the PET scan came back negative, we will get the port removed.  Nausea and vomiting Diarrhea Left lower quadrant abdominal pain CT of the abdomen and pelvis on 11/16 is consistent with possible sigmoid diverticulitis versus distal colitis No evidence of perforation, obstruction or abscess He was placed on IV fluids, IV antibiotics and IV Flagyl and antiemetics Cultures pending.  Thrombocytopenia Due to recent chemotherapy, and dilution No intervention  indicated at this time Will continue to monitor.   Atrial Fibrillation with rapid ventricular rate On diltiazem and aspirin per admitting team, improved  DVT prophylaxis On Lovenox Other medical issues as per admitting team   Full Code  Discharge planning Patient wants to go home. I recommend continue antibiotics and I will schedule daily IV fluids as an outpatient. He is comfortable with the plan.  **Disclaimer: This note was dictated with voice recognition software. Similar sounding words can inadvertently be transcribed and this note may contain transcription errors which may not have been corrected upon publication of note.Sharene Butters E, PA-C 11/27/2013  8:48 AM Ivy Meriwether, MD 11/27/2013

## 2013-11-27 NOTE — Progress Notes (Signed)
ANTIBIOTIC CONSULT NOTE - INITIAL  Pharmacy Consult for Cipro Indication: Intra-abdominal infection  No Known Allergies  Patient Measurements: Height = 88.2 kg   Vital Signs: Temp: 97.8 F (36.6 C) (11/17 0034) Temp Source: Oral (11/16 1929) BP: 100/75 mmHg (11/17 0034) Pulse Rate: 74 (11/17 0034) Intake/Output from previous day: 11/16 0701 - 11/17 0700 In: -  Out: 500 [Urine:500] Intake/Output from this shift: Total I/O In: -  Out: 500 [Urine:500]  Labs:  Recent Labs  11/26/13 2026  WBC 8.4  HGB 15.1  PLT 156  CREATININE 0.97   Estimated Creatinine Clearance: 80 mL/min (by C-G formula based on Cr of 0.97). No results for input(s): VANCOTROUGH, VANCOPEAK, VANCORANDOM, GENTTROUGH, GENTPEAK, GENTRANDOM, TOBRATROUGH, TOBRAPEAK, TOBRARND, AMIKACINPEAK, AMIKACINTROU, AMIKACIN in the last 72 hours.   Microbiology: No results found for this or any previous visit (from the past 720 hour(s)).  Medical History: Past Medical History  Diagnosis Date  . PSVT (paroxysmal supraventricular tachycardia)   . Arthritis   . 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 Metastasis to lymph nodes dx'd 03/2011    lt submandibular ln  . Atrial fibrillation     a. Isolated episode in the setting of acute pericarditis 04/2013. Was not placed on anticoag.  Marland Kitchen Acute pericarditis     a. 04/2013 -adm with CP, elevated CRP. H/o coronary artery calcification on prior CT but nuc was negative, EF 71%.  . Pain in joint, pelvic region and thigh 08/01/2013  . Diverticulitis 08/16/2013    Medications:  Scheduled:  . aspirin EC  81 mg Oral q morning - 10a  . atorvastatin  5 mg Oral QPM  . ciprofloxacin  400 mg  Intravenous Q12H  . cycloSPORINE  1 drop Both Eyes BID  . diltiazem  120 mg Oral q morning - 10a  . enoxaparin (LOVENOX) injection  40 mg Subcutaneous Q24H  . metoprolol succinate  25 mg Oral q morning - 10a  . metronidazole  500 mg Intravenous Q8H  . pantoprazole  40 mg Oral Daily   Infusions:  . sodium chloride     Assessment:  68 yr male with lymphoma undergoing chemotherapy  Compaint of nausea/voming/abdominal pain  CT shows diverticulosis - favoring distal colitis which could be infectious  MD has started Flagyl 500mg  IV q8h and requested pharmacy dose Cipro  Goal of Therapy:  Eradication of infection  Plan:  Cipro 400mg  IV q12h  Esmeralda Malay, Toribio Harbour, PharmD 11/27/2013,1:28 AM

## 2013-11-27 NOTE — Discharge Instructions (Signed)

## 2013-11-27 NOTE — Telephone Encounter (Signed)
Per staff message I have scheduled appts. Patient awares. JMW

## 2013-11-27 NOTE — Discharge Summary (Signed)
Physician Discharge Summary  Anthony Kelly PJK:[id number] DOB: [birth year] DOA: 11/26/2013  PCP: Lysbeth Penner, FNP  Admit date: 11/26/2013 Discharge date: 11/27/2013  Recommendations for Outpatient Follow-up:  1. Pt will need to follow up with PCP in 2-3 weeks post discharge 2. Please obtain BMP to evaluate electrolytes and kidney function, potassium level  3. Please also check CBC to evaluate Hg and Hct levels 4. Please note that pt was advised to continue taking Cipro and Flagyl upon discharge for 13 ore days upon discharge   Discharge Diagnoses:  Principal Problem:   Colitis Active Problems:   Low grade B-cell lymphoma   Atrial fibrillation with RVR   Hyperlipidemia   Discharge Condition: Stable  Diet recommendation: Heart healthy diet discussed in details   History of present illness:  68 y.o. male with a history of low-grade B-cell lymphoma on chemotherapy last chemotherapy was last week, atrial fibrillation, history of pericarditis, hyperlipidemia presented to the ER because of left lower quadrant abdominal pain with nausea and vomiting. Patient has been having constipation since last Tuesday one week ago and had chemotherapy on Wednesday and Thursday following which patient has been having increasing abdominal pain which patient thought it would get better but started developing nausea vomiting. Patient has some enema at home and had 2 bowel movements but since left lower quadrant pain was persistent patient came to the ER. CT abdomen and pelvis shows colitis/diverticulitis involving the rectosigmoid area and patient has been admitted for further management.When patient initially came patient was found to be in A. Fib with RVR which resolved by itself.   Hospital Course:  Principal Problem:   Colitis, acute  - pt started on Cipro and Flagyl - reports doing well this AM and wants to go home - tolerating current diet well and had one BM Active Problems:   Low grade  B-cell lymphoma - will see Dr. Alvy Bimler in an outpatient setting  - appreciate her input    Atrial fibrillation with RVR - rate controlled and no events on telemetry  - continue metoprolol and Cardizem as per home medical regimen    Hyperlipidemia - stable, continue statin    Procedures/Studies: Dg Chest 2 View  11/26/2013  No acute abnormality noted.     Ct Abdomen Pelvis W Contrast  11/26/2013   Diverticulosis of the descending and sigmoid colon.  Wall thickening of the sigmoid colon and rectum, with minimal pericolic infiltrative changes; findings could be due to sigmoid diverticulitis or diffuse colitis of the rectosigmoid colon in patient with chronic diverticular disease.  Involvement of the distal sigmoid colon and rectum favors distal colitis, which could be due to infection or inflammatory bowel disease, less likely ischemia by distribution.  No evidence of perforation, obstruction or abscess.    Consultations:  Oncology   Antibiotics:  Cipro and Flagyl upon discharge for 13 more days post discharge to complete therapy for colitis   Discharge Exam: Filed Vitals:   11/27/13 0944  BP: 106/69  Pulse:   Temp:   Resp:    Filed Vitals:   11/27/13 0034 11/27/13 0627 11/27/13 0943 11/27/13 0944  BP: 100/75 97/59 106/69 106/69  Pulse: 74 72 77   Temp: 97.8 F (36.6 C) 97.4 F (36.3 C)    TempSrc:  Oral    Resp: 16 16    SpO2: 99% 97%      General: Pt is alert, follows commands appropriately, not in acute distress Cardiovascular: Irregular rate and rhythm, no rubs, no gallops  Respiratory: Clear to auscultation bilaterally, no wheezing, no crackles, no rhonchi Abdominal: Soft, non tender, non distended, bowel sounds +, no guarding Extremities: no edema, no cyanosis, pulses palpable bilaterally DP and PT Neuro: Grossly nonfocal  Discharge Instructions  Discharge Instructions    Diet - low sodium heart healthy    Complete by:  As directed      Increase activity  slowly    Complete by:  As directed             Medication List    TAKE these medications        aspirin EC 81 MG tablet  Take 81 mg by mouth every morning.     atorvastatin 10 MG tablet  Commonly known as:  LIPITOR  Take 5 mg by mouth every evening.     ciprofloxacin 500 MG tablet  Commonly known as:  CIPRO  Take 1 tablet (500 mg total) by mouth 2 (two) times daily.     cycloSPORINE 0.05 % ophthalmic emulsion  Commonly known as:  RESTASIS  Place 1 drop into both eyes 2 (two) times daily.     diltiazem 120 MG 24 hr capsule  Commonly known as:  CARDIZEM CD  Take 1 capsule (120 mg total) by mouth every morning.     docusate sodium 100 MG capsule  Commonly known as:  COLACE  Take 100 mg by mouth 3 (three) times daily as needed for mild constipation.     lidocaine-prilocaine cream  Commonly known as:  EMLA  Apply 1 application topically as needed.     metoprolol succinate 25 MG 24 hr tablet  Commonly known as:  TOPROL-XL  Take 25 mg by mouth every morning.     metroNIDAZOLE 500 MG tablet  Commonly known as:  FLAGYL  Take 1 tablet (500 mg total) by mouth 3 (three) times daily.     multivitamin with minerals Tabs tablet  Take 1 tablet by mouth daily.     omeprazole 40 MG capsule  Commonly known as:  PRILOSEC  Take 40 mg by mouth daily.     ondansetron 8 MG tablet  Commonly known as:  ZOFRAN  Take 1 tablet (8 mg total) by mouth every 8 (eight) hours as needed for nausea.     oxyCODONE 5 MG immediate release tablet  Commonly known as:  Oxy IR/ROXICODONE  Take 1 tablet (5 mg total) by mouth every 4 (four) hours as needed for severe pain.     promethazine 25 MG tablet  Commonly known as:  PHENERGAN  Take 1 tablet (25 mg total) by mouth every 6 (six) hours as needed for nausea.           Follow-up Information    Follow up with Lysbeth Penner, FNP.   Specialty:  Family Medicine   Contact information:   [place] [address] [phone]        Follow up with Faye Ramsay, MD.   Specialty:  Internal Medicine   Why:  As needed, If symptoms worsen   Contact information:   [address] [place] [address] [phone]        The results of significant diagnostics from this hospitalization (including imaging, microbiology, ancillary and laboratory) are listed below for reference.     Microbiology: No results found for this or any previous visit (from the past 240 hour(s)).   Labs: Basic Metabolic Panel:  Recent Labs Lab 11/21/13 [id number] 11/26/13 2026 11/27/13 0250  NA 139 139 137  K 4.1 3.5* 3.6*  CL  --  100 102  CO2 25 24 23   GLUCOSE 125 154* 100*  BUN 14.6 17 16   CREATININE 1.0 0.97 0.91  CALCIUM 9.1 9.1 8.1*   Liver Function Tests:  Recent Labs Lab 11/21/13 0838 11/26/13 2026  AST 19 17  ALT 21 21  ALKPHOS 71 67  BILITOT 0.51 0.9  PROT 6.2* 6.6  ALBUMIN 3.6 3.3*   CBC:  Recent Labs Lab 11/21/13 0839 11/26/13 2026 11/27/13 0250  WBC 7.0 8.4 5.2  NEUTROABS 4.3 6.6  --   HGB 14.0 15.1 13.0  HCT 41.1 42.8 37.1*  MCV 88.4 85.9 86.9  PLT 145 156 127*    SIGNED: Time coordinating discharge: Over 30 minutes  Faye Ramsay, MD  [facility] 11/27/2013, 2:23 PM Pager [phone]  If 7PM-7AM, please contact night-coverage [email or web address] Password [email or web address]

## 2013-11-28 ENCOUNTER — Encounter: Payer: Self-pay | Admitting: *Deleted

## 2013-11-28 ENCOUNTER — Other Ambulatory Visit: Payer: Self-pay | Admitting: Family Medicine

## 2013-11-28 ENCOUNTER — Ambulatory Visit (HOSPITAL_BASED_OUTPATIENT_CLINIC_OR_DEPARTMENT_OTHER): Payer: Medicare Other

## 2013-11-28 DIAGNOSIS — C8510 Unspecified B-cell lymphoma, unspecified site: Secondary | ICD-10-CM | POA: Diagnosis not present

## 2013-11-28 DIAGNOSIS — K529 Noninfective gastroenteritis and colitis, unspecified: Secondary | ICD-10-CM

## 2013-11-28 MED ORDER — SODIUM CHLORIDE 0.9 % IV SOLN
Freq: Once | INTRAVENOUS | Status: AC
  Administered 2013-11-28: 16:00:00 via INTRAVENOUS

## 2013-11-28 MED ORDER — HEPARIN SOD (PORK) LOCK FLUSH 100 UNIT/ML IV SOLN
500.0000 [IU] | Freq: Once | INTRAVENOUS | Status: AC | PRN
  Administered 2013-11-28: 500 [IU]
  Filled 2013-11-28: qty 5

## 2013-11-28 MED ORDER — SODIUM CHLORIDE 0.9 % IJ SOLN
10.0000 mL | INTRAMUSCULAR | Status: DC | PRN
  Administered 2013-11-28: 10 mL
  Filled 2013-11-28: qty 10

## 2013-11-28 MED ORDER — ONDANSETRON 8 MG/NS 50 ML IVPB
INTRAVENOUS | Status: AC
  Filled 2013-11-28: qty 8

## 2013-11-28 MED ORDER — ONDANSETRON 8 MG/50ML IVPB (CHCC)
8.0000 mg | Freq: Once | INTRAVENOUS | Status: AC
  Administered 2013-11-28: 8 mg via INTRAVENOUS

## 2013-11-28 NOTE — Patient Instructions (Signed)
Dehydration, Adult Dehydration is when you lose more fluids from the body than you take in. Vital organs like the kidneys, brain, and heart cannot function without a proper amount of fluids and salt. Any loss of fluids from the body can cause dehydration.  CAUSES   Vomiting.  Diarrhea.  Excessive sweating.  Excessive urine output.  Fever. SYMPTOMS  Mild dehydration  Thirst.  Dry lips.  Slightly dry mouth. Moderate dehydration  Very dry mouth.  Sunken eyes.  Skin does not bounce back quickly when lightly pinched and released.  Dark urine and decreased urine production.  Decreased tear production.  Headache. Severe dehydration  Very dry mouth.  Extreme thirst.  Rapid, weak pulse (more than 100 beats per minute at rest).  Cold hands and feet.  Not able to sweat in spite of heat and temperature.  Rapid breathing.  Blue lips.  Confusion and lethargy.  Difficulty being awakened.  Minimal urine production.  No tears. DIAGNOSIS  Your caregiver will diagnose dehydration based on your symptoms and your exam. Blood and urine tests will help confirm the diagnosis. The diagnostic evaluation should also identify the cause of dehydration. TREATMENT  Treatment of mild or moderate dehydration can often be done at home by increasing the amount of fluids that you drink. It is best to drink small amounts of fluid more often. Drinking too much at one time can make vomiting worse. Refer to the home care instructions below. Severe dehydration needs to be treated at the hospital where you will probably be given intravenous (IV) fluids that contain water and electrolytes. HOME CARE INSTRUCTIONS   Ask your caregiver about specific rehydration instructions.  Drink enough fluids to keep your urine clear or pale yellow.  Drink small amounts frequently if you have nausea and vomiting.  Eat as you normally do.  Avoid:  Foods or drinks high in sugar.  Carbonated  drinks.  Juice.  Extremely hot or cold fluids.  Drinks with caffeine.  Fatty, greasy foods.  Alcohol.  Tobacco.  Overeating.  Gelatin desserts.  Wash your hands well to avoid spreading bacteria and viruses.  Only take over-the-counter or prescription medicines for pain, discomfort, or fever as directed by your caregiver.  Ask your caregiver if you should continue all prescribed and over-the-counter medicines.  Keep all follow-up appointments with your caregiver. SEEK MEDICAL CARE IF:  You have abdominal pain and it increases or stays in one area (localizes).  You have a rash, stiff neck, or severe headache.  You are irritable, sleepy, or difficult to awaken.  You are weak, dizzy, or extremely thirsty. SEEK IMMEDIATE MEDICAL CARE IF:   You are unable to keep fluids down or you get worse despite treatment.  You have frequent episodes of vomiting or diarrhea.  You have blood or green matter (bile) in your vomit.  You have blood in your stool or your stool looks black and tarry.  You have not urinated in 6 to 8 hours, or you have only urinated a small amount of very dark urine.  You have a fever.  You faint. MAKE SURE YOU:   Understand these instructions.  Will watch your condition.  Will get help right away if you are not doing well or get worse. Document Released: 12/28/2004 Document Revised: 03/22/2011 Document Reviewed: 08/17/2010 [employer] Patient Information 2015 [employer]. This information is not intended to replace advice given to you by your health care provider. Make sure you discuss any questions you have with your health care   provider.  

## 2013-11-29 ENCOUNTER — Other Ambulatory Visit: Payer: Self-pay | Admitting: *Deleted

## 2013-11-29 ENCOUNTER — Ambulatory Visit (HOSPITAL_BASED_OUTPATIENT_CLINIC_OR_DEPARTMENT_OTHER): Payer: Medicare Other

## 2013-11-29 DIAGNOSIS — C8510 Unspecified B-cell lymphoma, unspecified site: Secondary | ICD-10-CM

## 2013-11-29 DIAGNOSIS — K529 Noninfective gastroenteritis and colitis, unspecified: Secondary | ICD-10-CM

## 2013-11-29 MED ORDER — HEPARIN SOD (PORK) LOCK FLUSH 100 UNIT/ML IV SOLN
500.0000 [IU] | Freq: Once | INTRAVENOUS | Status: AC | PRN
  Administered 2013-11-29: 500 [IU]
  Filled 2013-11-29: qty 5

## 2013-11-29 MED ORDER — SODIUM CHLORIDE 0.9 % IV SOLN
Freq: Once | INTRAVENOUS | Status: AC
  Administered 2013-11-29: 15:00:00 via INTRAVENOUS

## 2013-11-29 MED ORDER — SODIUM CHLORIDE 0.9 % IJ SOLN
10.0000 mL | INTRAMUSCULAR | Status: DC | PRN
  Administered 2013-11-29: 10 mL
  Filled 2013-11-29: qty 10

## 2013-11-29 NOTE — Patient Instructions (Addendum)
Dehydration, Adult Dehydration is when you lose more fluids from the body than you take in. Vital organs like the kidneys, brain, and heart cannot function without a proper amount of fluids and salt. Any loss of fluids from the body can cause dehydration.  CAUSES   Vomiting.  Diarrhea.  Excessive sweating.  Excessive urine output.  Fever. SYMPTOMS  Mild dehydration  Thirst.  Dry lips.  Slightly dry mouth. Moderate dehydration  Very dry mouth.  Sunken eyes.  Skin does not bounce back quickly when lightly pinched and released.  Dark urine and decreased urine production.  Dorene Sorrow

## 2013-11-30 ENCOUNTER — Ambulatory Visit (HOSPITAL_BASED_OUTPATIENT_CLINIC_OR_DEPARTMENT_OTHER): Payer: Medicare Other

## 2013-11-30 DIAGNOSIS — C8510 Unspecified B-cell lymphoma, unspecified site: Secondary | ICD-10-CM | POA: Diagnosis not present

## 2013-11-30 DIAGNOSIS — K529 Noninfective gastroenteritis and colitis, unspecified: Secondary | ICD-10-CM

## 2013-11-30 MED ORDER — SODIUM CHLORIDE 0.9 % IV SOLN
Freq: Once | INTRAVENOUS | Status: AC
  Administered 2013-11-30: 14:00:00 via INTRAVENOUS

## 2013-11-30 MED ORDER — ONDANSETRON 8 MG/50ML IVPB (CHCC)
8.0000 mg | Freq: Once | INTRAVENOUS | Status: AC
  Administered 2013-11-30: 8 mg via INTRAVENOUS

## 2013-11-30 MED ORDER — ONDANSETRON 8 MG/NS 50 ML IVPB
INTRAVENOUS | Status: AC
  Filled 2013-11-30: qty 8

## 2013-11-30 MED ORDER — HEPARIN SOD (PORK) LOCK FLUSH 100 UNIT/ML IV SOLN
500.0000 [IU] | Freq: Once | INTRAVENOUS | Status: AC | PRN
Start: 2013-11-30 — End: 2013-11-30
  Administered 2013-11-30: 500 [IU]
  Filled 2013-11-30: qty 5

## 2013-11-30 MED ORDER — SODIUM CHLORIDE 0.9 % IJ SOLN
10.0000 mL | INTRAMUSCULAR | Status: DC | PRN
  Administered 2013-11-30: 10 mL
  Filled 2013-11-30: qty 10

## 2013-11-30 NOTE — Patient Instructions (Signed)
Dehydration, Adult Dehydration is when you lose more fluids from the body than you take in. Vital organs like the kidneys, brain, and heart cannot function without a proper amount of fluids and salt. Any loss of fluids from the body can cause dehydration.  CAUSES   Vomiting.  Diarrhea.  Excessive sweating.  Excessive urine output.  Fever. SYMPTOMS  Mild dehydration  Thirst.  Dry lips.  Slightly dry mouth. Moderate dehydration  Very dry mouth.  Sunken eyes.  Skin does not bounce back quickly when lightly pinched and released.  Dark urine and decreased urine production.  Decreased tear production.  Headache. Severe dehydration  Very dry mouth.  Extreme thirst.  Rapid, weak pulse (more than 100 beats per minute at rest).  Cold hands and feet.  Not able to sweat in spite of heat and temperature.  Rapid breathing.  Blue lips.  Confusion and lethargy.  Difficulty being awakened.  Minimal urine production.  No tears. DIAGNOSIS  Your caregiver will diagnose dehydration based on your symptoms and your exam. Blood and urine tests will help confirm the diagnosis. The diagnostic evaluation should also identify the cause of dehydration. TREATMENT  Treatment of mild or moderate dehydration can often be done at home by increasing the amount of fluids that you drink. It is best to drink small amounts of fluid more often. Drinking too much at one time can make vomiting worse. Refer to the home care instructions below. Severe dehydration needs to be treated at the hospital where you will probably be given intravenous (IV) fluids that contain water and electrolytes. HOME CARE INSTRUCTIONS   Ask your caregiver about specific rehydration instructions.  Drink enough fluids to keep your urine clear or pale yellow.  Drink small amounts frequently if you have nausea and vomiting.  Eat as you normally do.  Avoid:  Foods or drinks high in sugar.  Carbonated  drinks.  Juice.  Extremely hot or cold fluids.  Drinks with caffeine.  Fatty, greasy foods.  Alcohol.  Tobacco.  Overeating.  Gelatin desserts.  Wash your hands well to avoid spreading bacteria and viruses.  Only take over-the-counter or prescription medicines for pain, discomfort, or fever as directed by your caregiver.  Ask your caregiver if you should continue all prescribed and over-the-counter medicines.  Keep all follow-up appointments with your caregiver. SEEK MEDICAL CARE IF:  You have abdominal pain and it increases or stays in one area (localizes).  You have a rash, stiff neck, or severe headache.  You are irritable, sleepy, or difficult to awaken.  You are weak, dizzy, or extremely thirsty. SEEK IMMEDIATE MEDICAL CARE IF:   You are unable to keep fluids down or you get worse despite treatment.  You have frequent episodes of vomiting or diarrhea.  You have blood or green matter (bile) in your vomit.  You have blood in your stool or your stool looks black and tarry.  You have not urinated in 6 to 8 hours, or you have only urinated a small amount of very dark urine.  You have a fever.  You faint. MAKE SURE YOU:   Understand these instructions.  Will watch your condition.  Will get help right away if you are not doing well or get worse. Document Released: 12/28/2004 Document Revised: 03/22/2011 Document Reviewed: 08/17/2010 [employer] Patient Information 2015 [employer]. This information is not intended to replace advice given to you by your health care provider. Make sure you discuss any questions you have with your health care   provider.  

## 2013-12-04 ENCOUNTER — Telehealth: Payer: Self-pay | Admitting: *Deleted

## 2013-12-04 NOTE — Telephone Encounter (Signed)
Pt wants to know if he can resume a regular diet?  He has not had any abd pain and is having regular BMs.  Mostly eating bland diet at this point.  Ok to advance diet to as tolerated and have a "normal Thanksgiving?"

## 2013-12-04 NOTE — Telephone Encounter (Signed)
Yes please

## 2013-12-04 NOTE — Telephone Encounter (Signed)
Informed pt he may eat regular diet.  He verbalized understanding.

## 2013-12-07 ENCOUNTER — Emergency Department (HOSPITAL_COMMUNITY): Payer: Medicare Other

## 2013-12-07 ENCOUNTER — Emergency Department (HOSPITAL_COMMUNITY)
Admission: EM | Admit: 2013-12-07 | Discharge: 2013-12-07 | Disposition: A | Discharge status: Home / Self Care | Payer: Medicare Other | Attending: Emergency Medicine | Admitting: Emergency Medicine

## 2013-12-07 ENCOUNTER — Encounter (HOSPITAL_COMMUNITY): Payer: Self-pay | Admitting: Emergency Medicine

## 2013-12-07 ENCOUNTER — Telehealth: Payer: Self-pay | Admitting: *Deleted

## 2013-12-07 DIAGNOSIS — Z9049 Acquired absence of other specified parts of digestive tract: Secondary | ICD-10-CM | POA: Diagnosis not present

## 2013-12-07 DIAGNOSIS — Z8572 Personal history of non-Hodgkin lymphomas: Secondary | ICD-10-CM | POA: Insufficient documentation

## 2013-12-07 DIAGNOSIS — I4891 Unspecified atrial fibrillation: Secondary | ICD-10-CM | POA: Insufficient documentation

## 2013-12-07 DIAGNOSIS — Z85118 Personal history of other malignant neoplasm of bronchus and lung: Secondary | ICD-10-CM | POA: Diagnosis not present

## 2013-12-07 DIAGNOSIS — R1032 Left lower quadrant pain: Secondary | ICD-10-CM | POA: Diagnosis not present

## 2013-12-07 DIAGNOSIS — R1031 Right lower quadrant pain: Secondary | ICD-10-CM

## 2013-12-07 DIAGNOSIS — Z8579 Personal history of other malignant neoplasms of lymphoid, hematopoietic and related tissues: Secondary | ICD-10-CM | POA: Insufficient documentation

## 2013-12-07 DIAGNOSIS — Z792 Long term (current) use of antibiotics: Secondary | ICD-10-CM | POA: Insufficient documentation

## 2013-12-07 DIAGNOSIS — Z7982 Long term (current) use of aspirin: Secondary | ICD-10-CM | POA: Insufficient documentation

## 2013-12-07 DIAGNOSIS — Z79899 Other long term (current) drug therapy: Secondary | ICD-10-CM | POA: Diagnosis not present

## 2013-12-07 DIAGNOSIS — I471 Supraventricular tachycardia: Secondary | ICD-10-CM | POA: Insufficient documentation

## 2013-12-07 DIAGNOSIS — K529 Noninfective gastroenteritis and colitis, unspecified: Secondary | ICD-10-CM | POA: Diagnosis not present

## 2013-12-07 DIAGNOSIS — K449 Diaphragmatic hernia without obstruction or gangrene: Secondary | ICD-10-CM | POA: Diagnosis not present

## 2013-12-07 DIAGNOSIS — Z87891 Personal history of nicotine dependence: Secondary | ICD-10-CM | POA: Insufficient documentation

## 2013-12-07 DIAGNOSIS — M1990 Unspecified osteoarthritis, unspecified site: Secondary | ICD-10-CM | POA: Insufficient documentation

## 2013-12-07 DIAGNOSIS — R109 Unspecified abdominal pain: Secondary | ICD-10-CM | POA: Diagnosis not present

## 2013-12-07 DIAGNOSIS — R1030 Lower abdominal pain, unspecified: Secondary | ICD-10-CM | POA: Diagnosis present

## 2013-12-07 LAB — COMPREHENSIVE METABOLIC PANEL
ALBUMIN: 3.6 g/dL (ref 3.5–5.2)
ALT: 37 U/L (ref 0–53)
ANION GAP: 13 (ref 5–15)
AST: 31 U/L (ref 0–37)
Alkaline Phosphatase: 59 U/L (ref 39–117)
BUN: 10 mg/dL (ref 6–23)
CALCIUM: 9.3 mg/dL (ref 8.4–10.5)
CO2: 26 mEq/L (ref 19–32)
CREATININE: 1.04 mg/dL (ref 0.50–1.35)
Chloride: 105 mEq/L (ref 96–112)
GFR calc Af Amer: 83 mL/min — ABNORMAL LOW (ref >90)
GFR calc non Af Amer: 72 mL/min — ABNORMAL LOW (ref >90)
Glucose, Bld: 108 mg/dL — ABNORMAL HIGH (ref 70–99)
Potassium: 5.2 mEq/L (ref 3.7–5.3)
Sodium: 144 mEq/L (ref 137–147)
TOTAL PROTEIN: 6.6 g/dL (ref 6.0–8.3)
Total Bilirubin: 0.4 mg/dL (ref 0.3–1.2)

## 2013-12-07 LAB — URINALYSIS, ROUTINE W REFLEX MICROSCOPIC
Bilirubin Urine: NEGATIVE
Glucose, UA: NEGATIVE mg/dL
Hgb urine dipstick: NEGATIVE
Ketones, ur: NEGATIVE mg/dL
Leukocytes, UA: NEGATIVE
NITRITE: NEGATIVE
Protein, ur: NEGATIVE mg/dL
SPECIFIC GRAVITY, URINE: 1.009 (ref 1.005–1.030)
UROBILINOGEN UA: 0.2 mg/dL (ref 0.0–1.0)
pH: 6 (ref 5.0–8.0)

## 2013-12-07 LAB — CBC WITH DIFFERENTIAL/PLATELET
BASOS PCT: 1 % (ref 0–1)
Basophils Absolute: 0 10*3/uL (ref 0.0–0.1)
EOS PCT: 3 % (ref 0–5)
Eosinophils Absolute: 0.2 10*3/uL (ref 0.0–0.7)
HEMATOCRIT: 45 % (ref 39.0–52.0)
HEMOGLOBIN: 15.5 g/dL (ref 13.0–17.0)
Lymphocytes Relative: 20 % (ref 12–46)
Lymphs Abs: 1.2 10*3/uL (ref 0.7–4.0)
MCH: 30.6 pg (ref 26.0–34.0)
MCHC: 34.4 g/dL (ref 30.0–36.0)
MCV: 88.8 fL (ref 78.0–100.0)
MONO ABS: 1.3 10*3/uL — AB (ref 0.1–1.0)
MONOS PCT: 21 % — AB (ref 3–12)
Neutro Abs: 3.4 10*3/uL (ref 1.7–7.7)
Neutrophils Relative %: 55 % (ref 43–77)
Platelets: 168 10*3/uL (ref 150–400)
RBC: 5.07 MIL/uL (ref 4.22–5.81)
RDW: 13.5 % (ref 11.5–15.5)
WBC: 6.1 10*3/uL (ref 4.0–10.5)

## 2013-12-07 LAB — LIPASE, BLOOD: LIPASE: 31 U/L (ref 11–59)

## 2013-12-07 IMAGING — CR DG ABDOMEN ACUTE W/ 1V CHEST
3 series · 3 of 3 positions shown · non-contrast
Comparison: Chest radiograph [DATE].

CLINICAL DATA: Abdominal pain.

EXAM:
ACUTE ABDOMEN SERIES (ABDOMEN 2 VIEW & CHEST 1 VIEW)

[w chest pa]
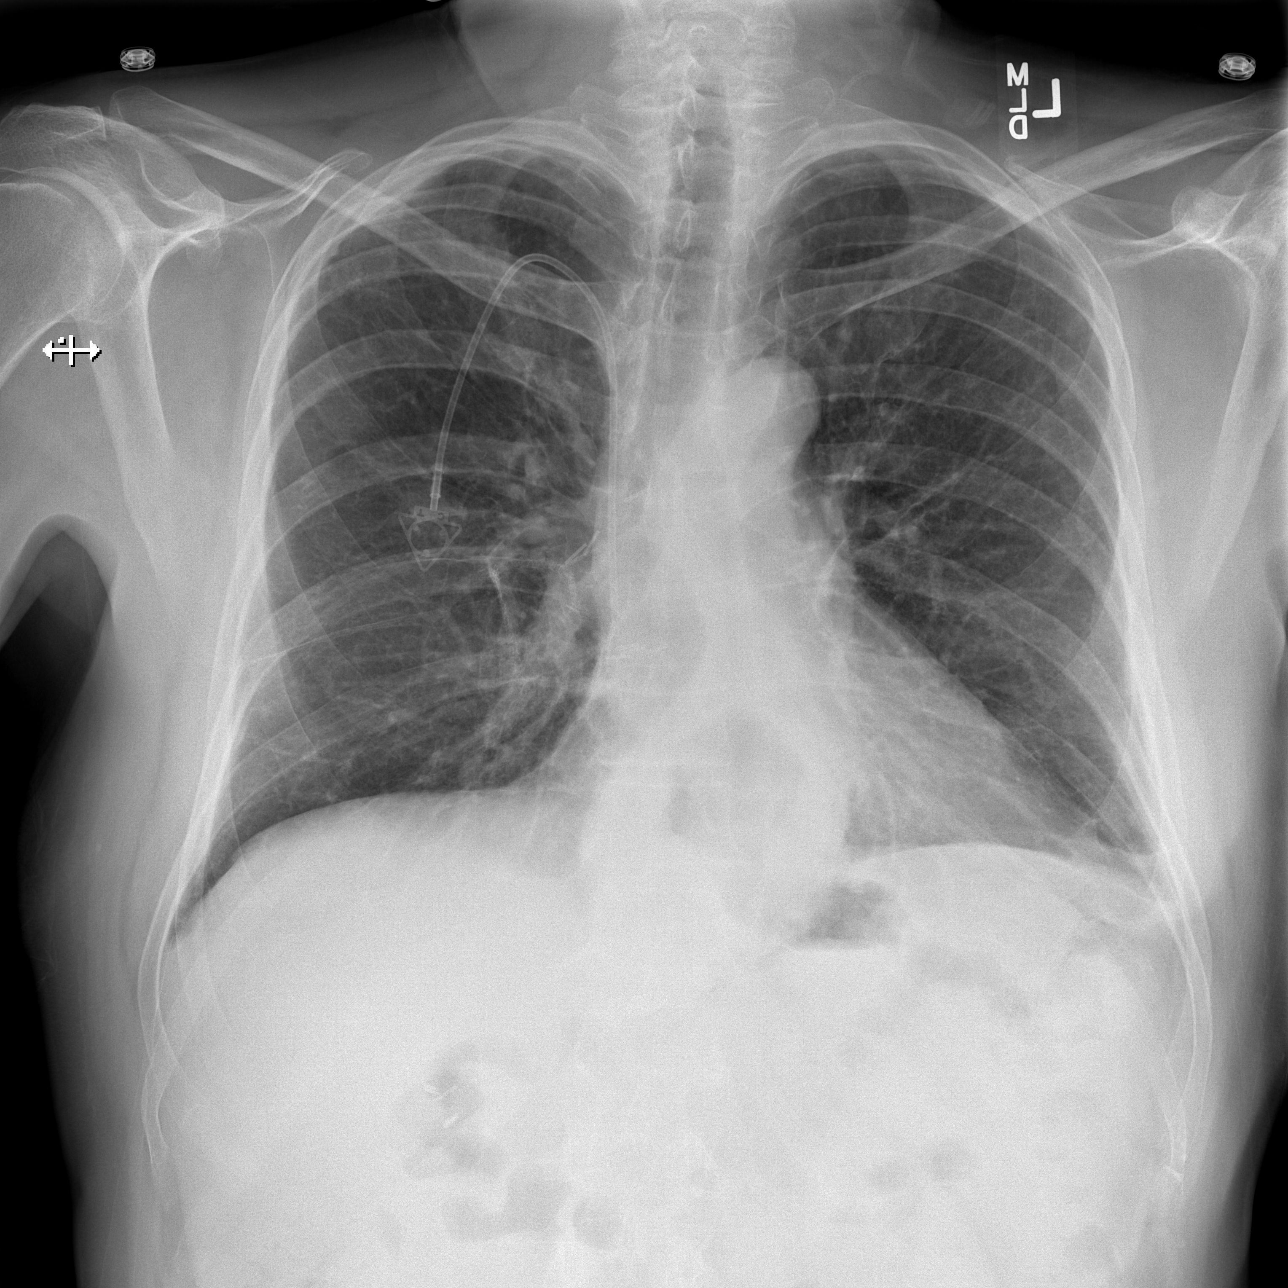

[w abdomen upright]
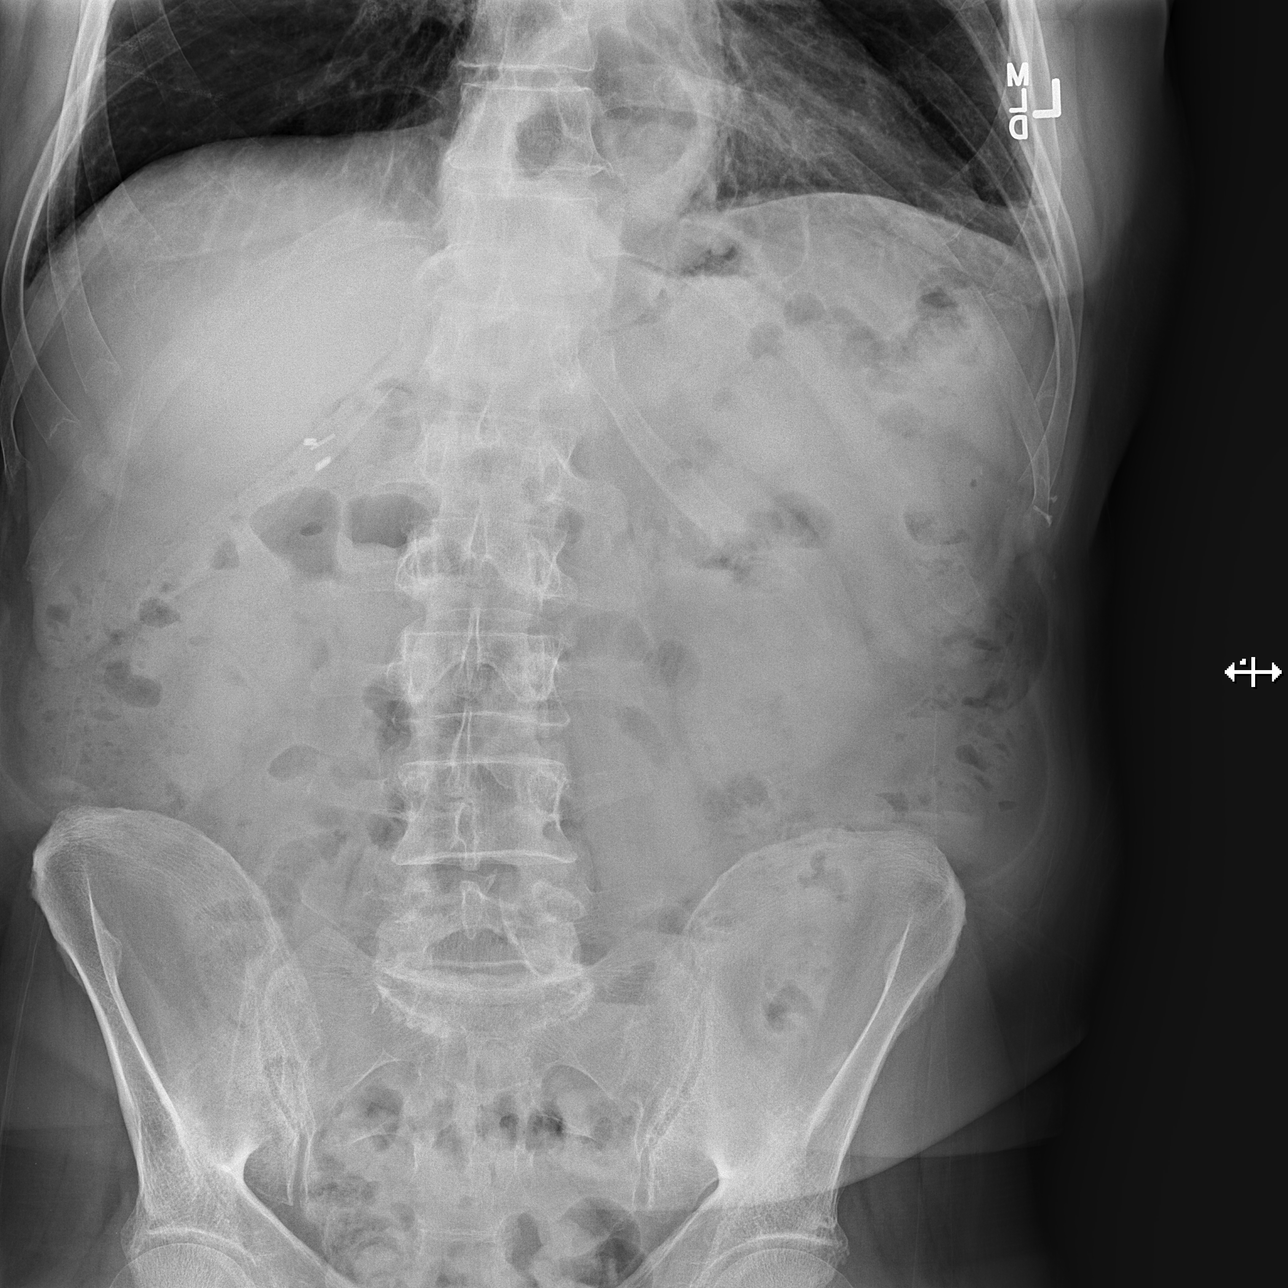

[t abdomen supine]
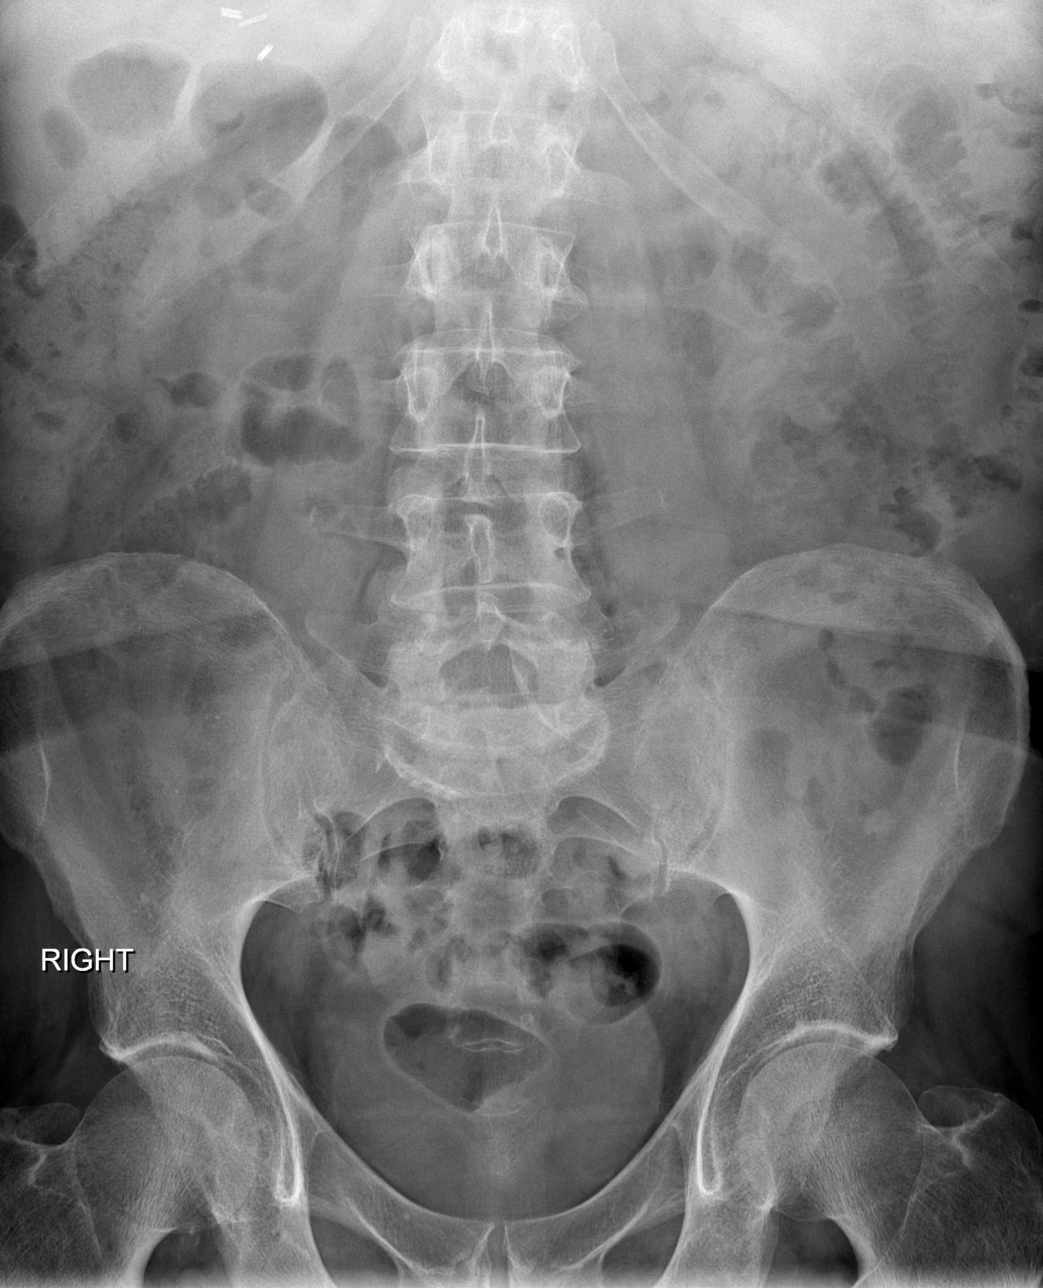

[3 of 3 positions shown; findings below may reference images not displayed]

FINDINGS: There is no evidence of dilated bowel loops or free intraperitoneal
air. No radiopaque calculi or other significant radiographic
abnormality is seen. Heart size and mediastinal contours are within
normal limits. Right internal jugular Port-A-Cath is unchanged with
distal tip overlying expected position of the SVC. Moderate hiatal
hernia is noted. Status post cholecystectomy. Both lungs are clear.
IMPRESSION: No evidence of bowel obstruction or ileus. No acute cardiopulmonary
abnormality seen.

## 2013-12-07 MED ORDER — HYOSCYAMINE SULFATE 0.125 MG PO TABS
0.1250 mg | ORAL_TABLET | Freq: Once | ORAL | Status: AC
  Administered 2013-12-07: 0.125 mg via ORAL
  Filled 2013-12-07 (×2): qty 1

## 2013-12-07 MED ORDER — CIPROFLOXACIN HCL 500 MG PO TABS: 500.0000 mg | ORAL_TABLET | Freq: Two times a day (BID) | ORAL | Status: DC

## 2013-12-07 MED ORDER — HYOSCYAMINE SULFATE 0.125 MG SL SUBL: 0.1250 mg | SUBLINGUAL_TABLET | Freq: Four times a day (QID) | SUBLINGUAL | Status: DC | PRN

## 2013-12-07 MED ORDER — METRONIDAZOLE 500 MG PO TABS
500.0000 mg | ORAL_TABLET | Freq: Three times a day (TID) | ORAL | Status: DC
Start: 2013-12-07 — End: 2013-12-18

## 2013-12-07 NOTE — ED Notes (Addendum)
Pt reports lower abd cramping that started last night. Recently discharged from hospital for colitis/diverticulitis. No n/v, had had 2x episodes of diarrhea. 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

## 2013-12-07 NOTE — ED Provider Notes (Signed)
CSN: [id number]     Arrival date & time 12/07/13  1405 History   First MD Initiated Contact with Patient 12/07/13 1552     Chief Complaint  Patient presents with  . Abdominal Pain    Patient is a 68 y.o. male presenting with abdominal pain. The history is provided by the patient.  Abdominal Pain Ppatient presents for evaluation of lower abdominal pain. He developed cramping lower abdominal pain that started last night worsening this morning. His symptoms are now oozing. Symptoms are non-radiating and intermittent. He had 2 loose bowel movements today. He denies fevers, vomiting, dysuria, additional symptoms. He was recently admitted and evaluated a week ago for similar but much less worse symptoms. At that time he had a CT scan of his abdomen that demonstrated colitis and he was treated for diverticulitis with Cipro and Flagyl. He currently has lymphoma and is undergoing chemotherapy but finished his treatment one week ago. He has a indwelling port.  Past Medical History  Diagnosis Date  . PSVT (paroxysmal supraventricular tachycardia)   . Arthritis   . 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 Metastasis to lymph nodes dx'd 03/2011    lt submandibular ln  . Atrial fibrillation     a. Isolated episode in the setting of acute pericarditis 04/2013. Was not placed on anticoag.  Marland Kitchen Acute pericarditis     a. 04/2013 -adm with CP, elevated CRP. H/o coronary artery calcification on prior CT but nuc was negative, EF 71%.  . Pain in joint, pelvic region and thigh 08/01/2013  . Diverticulitis 08/16/2013   Past Surgical History  Procedure Laterality Date  . Lung lobectomy      right side  . Cholecystectomy    .  Lymph node biopsy      in groin with removal  . Meniscus repair      right knee  . Vein ligation and stripping      right leg  . Tonsillectomy      as a child  . Submandibular gland excision  04/2011  . Submandibular gland excision  04/30/2011    Procedure: EXCISION SUBMANDIBULAR GLAND;  Surgeon: Jerrell Belfast, MD;  Location: [facility];  Service: ENT;  Laterality: Left;  WITH DIAGNOSTIC BIOPSY  . Exploratory laparotomy     Family History  Problem Relation Age of Onset  . Anesthesia problems Neg Hx   . Heart disease Father   . Cancer Sister     breast ca  . Cancer Brother     prostate ca   History  Substance Use Topics  . Smoking status: Former Smoker -- 1.50 packs/day for 25 years    Quit date: 01/11/1990  . Smokeless tobacco: Never Used  . Alcohol Use: No    Review of Systems  Gastrointestinal: Positive for abdominal pain.  All other systems reviewed and are negative.     Allergies  Review of patient's allergies indicates no known allergies.  Home Medications   Prior to Admission medications   Medication Sig Start Date End Date Taking? Authorizing Provider  aspirin EC 81 MG tablet Take 81 mg by mouth every morning.    Historical Provider, MD  atorvastatin (LIPITOR) 10 MG  tablet Take 5 mg by mouth every evening.     Historical Provider, MD  ciprofloxacin (CIPRO) 500 MG tablet Take 1 tablet (500 mg total) by mouth 2 (two) times daily. 11/27/13   Theodis Blaze, MD  cycloSPORINE (RESTASIS) 0.05 % ophthalmic emulsion Place 1 drop into both eyes 2 (two) times daily.    Historical Provider, MD  diltiazem (CARDIZEM CD) 120 MG 24 hr capsule Take 1 capsule (120 mg total) by mouth every morning. 10/11/13   [name], MD  docusate sodium (COLACE) 100 MG capsule Take 100 mg by mouth 3 (three) times daily as needed for mild constipation.     Historical Provider, MD  lidocaine-prilocaine (EMLA) cream Apply 1 application topically as needed. 06/28/13   [name], MD   metoprolol succinate (TOPROL-XL) 25 MG 24 hr tablet Take 25 mg by mouth every morning.    Historical Provider, MD  metroNIDAZOLE (FLAGYL) 500 MG tablet Take 1 tablet (500 mg total) by mouth 3 (three) times daily. 11/27/13   Theodis Blaze, MD  Multiple Vitamin (MULTIVITAMIN WITH MINERALS) TABS tablet Take 1 tablet by mouth daily.    Historical Provider, MD  omeprazole (PRILOSEC) 40 MG capsule Take 40 mg by mouth daily.    Historical Provider, MD  ondansetron (ZOFRAN) 8 MG tablet Take 1 tablet (8 mg total) by mouth every 8 (eight) hours as needed for nausea. 06/28/13   [name], MD  oxyCODONE (OXY IR/ROXICODONE) 5 MG immediate release tablet Take 1 tablet (5 mg total) by mouth every 4 (four) hours as needed for severe pain. 11/27/13   Theodis Blaze, MD  promethazine (PHENERGAN) 25 MG tablet Take 1 tablet (25 mg total) by mouth every 6 (six) hours as needed for nausea. Patient taking differently: Take 6.25-25 mg by mouth every 6 (six) hours as needed for nausea.  06/28/13   Ni Gorsuch, MD   BP 99/63 mmHg  Pulse 104  Temp(Src) 98 F (36.7 C) (Oral)  Resp 16  SpO2 94% Physical Exam  Constitutional: He is oriented to person, place, and time. He appears well-developed and well-nourished.  HENT:  Head: Normocephalic and atraumatic.  Cardiovascular: Normal rate.   No murmur heard. Pulmonary/Chest: Effort normal. No respiratory distress.  Abdominal:  Soft, moderate lower abdominal tenderness without guarding or rebound tenderness  Musculoskeletal: He exhibits no edema or tenderness.  Neurological: He is alert and oriented to person, place, and time.  Skin: Skin is warm and dry.  Psychiatric: He has a normal mood and affect. His behavior is normal.  Nursing note and vitals reviewed.   ED Course  Procedures (including critical care time) Labs Review Labs Reviewed  CBC WITH DIFFERENTIAL - Abnormal; Notable for the following:    Monocytes Relative 21 (*)    Monocytes Absolute 1.3 (*)    All  other components within normal limits  COMPREHENSIVE METABOLIC PANEL - Abnormal; Notable for the following:    Glucose, Bld 108 (*)    GFR calc non Af Amer 72 (*)    GFR calc Af Amer 83 (*)    All other components within normal limits  LIPASE, BLOOD  URINALYSIS, ROUTINE W REFLEX MICROSCOPIC    Imaging Review Dg Abd Acute W/chest  12/07/2013   CLINICAL DATA:  Abdominal pain.  EXAM: ACUTE ABDOMEN SERIES (ABDOMEN 2 VIEW & CHEST 1 VIEW)  COMPARISON:  Chest radiograph November 26, 2013.  FINDINGS: There is no evidence of dilated bowel loops or free intraperitoneal air. No radiopaque  calculi or other significant radiographic abnormality is seen. Heart size and mediastinal contours are within normal limits. Right internal jugular Port-A-Cath is unchanged with distal tip overlying expected position of the SVC. Moderate hiatal hernia is noted. Status post cholecystectomy. Both lungs are clear.  IMPRESSION: No evidence of bowel obstruction or ileus. No acute cardiopulmonary abnormality seen.   Electronically Signed   By: Sabino Dick M.D.   On: 12/07/2013 17:52     EKG Interpretation None      MDM   Final diagnoses:  Acute bilateral lower abdominal pain  Colitis, acute    Mr. Medlock is here for evaluation of abdominal pain, he has a history of lightest/diverticulitis that was diagnosed 2 weeks ago and he is finishing up his course of antibiotics. He is nontoxic on exam with no peritoneal findings. Clinical picture is not consistent with diverticular abscess, acute ischemic colitis, small bowel obstruction. Plan to continue treatment with diverticulitis with plan for GI follow-up as an outpatient for her to further evaluate his colitis. Discussed home care as well as return precautions. Patient prescribed Levsin, Cipro, Flagyl.    Quintella Reichert, MD 12/08/13 [phone]

## 2013-12-07 NOTE — Telephone Encounter (Signed)
Wife reports pt woke up with some abd pain/cramping this morning.  Pain is intermittent 8/10 when it hits and relieved when he can get up and walk around.  He has had several bouts of diarrhea.  Has been having good soft BMs this past week and diarrhea today.  Denies any fevers, no n/v this morning.  Ate some scrambled eggs and waffle for breakfast,  Ate Thanksgiving dinner last night.  Did have a little nausea after dinner last night but none this morning.   He is taking Cipro and Flagyl since hospitalization last week.  Today is last day of these meds.   Informed Dr. Alen Blew of above and pt's recent hospitalization for diverticulitis vs. Colitis.  Dr. Alen Blew instructs for pt to go to ED.  Instructed wife to take pt to ED to make sure this is not a recurrence of diverticulitis or colitis.  She verbalized understanding.  I called to ED Charge RN, Delsa Sale and notified of pt coming.

## 2013-12-07 NOTE — Discharge Instructions (Signed)

## 2013-12-09 ENCOUNTER — Encounter: Payer: Self-pay | Admitting: Cardiovascular Disease

## 2013-12-10 ENCOUNTER — Telehealth: Payer: Self-pay | Admitting: *Deleted

## 2013-12-10 ENCOUNTER — Other Ambulatory Visit: Payer: Self-pay | Admitting: Oncology

## 2013-12-10 NOTE — Telephone Encounter (Signed)
Can you check to see if he is better today? Thanks

## 2013-12-10 NOTE — Telephone Encounter (Signed)
Wife states pt is feeling better.  He is taking levsin for his abd cramping and it is helping.  He has good appetite but trying to stick to bland diet for now.  He has had one episode of diarrhea today.  He is drinking plenty of fluids.   Encouraged wife to make appt for pt to see GI MD.  She says she will call for appt.Marland Kitchen

## 2013-12-11 ENCOUNTER — Encounter: Payer: Self-pay | Admitting: Family Medicine

## 2013-12-12 ENCOUNTER — Other Ambulatory Visit: Payer: Self-pay | Admitting: Hematology and Oncology

## 2013-12-12 ENCOUNTER — Telehealth: Payer: Self-pay | Admitting: *Deleted

## 2013-12-12 ENCOUNTER — Encounter: Payer: Self-pay | Admitting: *Deleted

## 2013-12-12 DIAGNOSIS — K5712 Diverticulitis of small intestine without perforation or abscess without bleeding: Secondary | ICD-10-CM

## 2013-12-12 NOTE — Telephone Encounter (Signed)
Pt seen in past by GI at [facility], but he wants to switch to [facility].  Wife called [facility], but they will not accept pt w/o a referral.  Can Dr. Alvy Bimler make the Referral to [facility]?

## 2013-12-12 NOTE — Telephone Encounter (Signed)
Order placed and POF sent

## 2013-12-15 ENCOUNTER — Other Ambulatory Visit: Payer: Self-pay | Admitting: Family Medicine

## 2013-12-18 ENCOUNTER — Ambulatory Visit (INDEPENDENT_AMBULATORY_CARE_PROVIDER_SITE_OTHER): Payer: Medicare Other | Admitting: Nurse Practitioner

## 2013-12-18 ENCOUNTER — Encounter: Payer: Self-pay | Admitting: Nurse Practitioner

## 2013-12-18 VITALS — BP 106/78 | HR 80 | Ht 72.0 in | Wt 189.8 lb

## 2013-12-18 DIAGNOSIS — K5733 Diverticulitis of large intestine without perforation or abscess with bleeding: Secondary | ICD-10-CM | POA: Diagnosis not present

## 2013-12-18 DIAGNOSIS — R933 Abnormal findings on diagnostic imaging of other parts of digestive tract: Secondary | ICD-10-CM | POA: Diagnosis not present

## 2013-12-18 DIAGNOSIS — I2510 Atherosclerotic heart disease of native coronary artery without angina pectoris: Secondary | ICD-10-CM | POA: Diagnosis not present

## 2013-12-18 DIAGNOSIS — K5732 Diverticulitis of large intestine without perforation or abscess without bleeding: Secondary | ICD-10-CM | POA: Diagnosis not present

## 2013-12-18 MED ORDER — POLYETHYLENE GLYCOL 3350 17 GM/SCOOP PO POWD: ORAL | Status: DC

## 2013-12-18 MED ORDER — POLYETHYLENE GLYCOL 3350 17 GM/SCOOP PO POWD: 1.0000 | Freq: Every day | ORAL | Status: DC

## 2013-12-18 NOTE — Progress Notes (Signed)
HPI :  Patient is a 68 year old male, new to this practice, referred by oncology for diverticulitis. Patient has a history of follicular lymphoma, status post chemotherapy. He is due for a PET scan later this month.  While on chemotherapy patient had problems with constipation. At some point in time oncology started him on omeprazole for upper abdominal pain which has since resolved . Patient completed chemotherapy mid November. A week or so later he presented to the emergency department for evaluation of lower abdominal pain, nausea and vomiting. CT scan revealed diverticulosis of the descending and sigmoid colon with diffuse wall thickening and mild pericolonic inflammatory changes about the sigmoid. Additionally there was there was thickening of the rectum. Intrahepatic biliary duct dilation postcholecystectomy. CBD was 11 mm, not significantly changed. Patient was treated for diverticulitis with Cipro and Flagyl. On the tenth day of treatment he went back to ED for lower abdominal pain. Antibiotics were extended , he completed them last Sunday. was evaluated in the emergency department for persistent symptoms. CBC was normal . Antibiotics were extended by another week which he completed last Sunday. Pain has totally resolved. Bowel movement have normalized off chemotherapy. No other gastrointestinal complaints.   Past Medical History  Diagnosis Date  . PSVT (paroxysmal supraventricular tachycardia)   . Arthritis   . 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 Metastasis to lymph nodes dx'd 03/2011    lt submandibular ln  . Atrial fibrillation     a. Isolated episode in the setting of acute pericarditis  04/2013. Was not placed on anticoag.  Marland Kitchen Acute pericarditis     a. 04/2013 -adm with CP, elevated CRP. H/o coronary artery calcification on prior CT but nuc was negative, EF 71%.  . Pain in joint, pelvic region and thigh 08/01/2013  . Diverticulitis 08/16/2013    Family History  Problem Relation Age of Onset  . Anesthesia problems Neg Hx   . Heart disease Father   . Cancer Sister     breast ca  . Cancer Brother     prostate ca   History  Substance Use Topics  . Smoking status: Former Smoker -- 1.50 packs/day for 25 years    Quit date: 01/11/1990  . Smokeless tobacco: Never Used  . Alcohol Use: No   Current Outpatient Prescriptions  Medication Sig Dispense Refill  . aspirin EC 81 MG tablet Take 81 mg by mouth every morning.    Marland Kitchen atorvastatin (LIPITOR) 10 MG tablet Take 5 mg by mouth every evening.     . cycloSPORINE (RESTASIS) 0.05 % ophthalmic emulsion Place 1 drop into both eyes 2 (two) times daily.    Marland Kitchen diltiazem (CARDIZEM CD) 120 MG 24 hr capsule Take 1 capsule (120 mg total) by mouth every morning. (Patient taking differently: Take 120 mg by mouth daily. In the evening) 90 capsule 3  . metoprolol succinate (TOPROL-XL) 25 MG 24 hr tablet Take 25 mg by mouth every morning.    . Multiple Vitamin (MULTIVITAMIN WITH MINERALS) TABS tablet Take 1 tablet by mouth daily.    Marland Kitchen omeprazole (PRILOSEC) 40 MG capsule Take 40 mg by mouth daily.    . ondansetron (ZOFRAN) 8 MG tablet  Take 1 tablet (8 mg total) by mouth every 8 (eight) hours as needed for nausea. 30 tablet 3  . Probiotic Product (PROBIOTIC DAILY PO) Take 2 capsules by mouth daily.    . promethazine (PHENERGAN) 25 MG tablet Take 1 tablet (25 mg total) by mouth every 6 (six) hours as needed for nausea. (Patient taking differently: Take 6.25-25 mg by mouth every 6 (six) hours as needed for nausea. ) 30 tablet 3   No current facility-administered medications for this visit.   No Known Allergies   Review of Systems: Positive for back  pain, heart rhythm changes and shortness of breath . All other systems reviewed and negative except where noted in HPI.    Dg Chest 2 View  11/26/2013   CLINICAL DATA:  Nausea and vomiting for 2 days  EXAM: CHEST  2 VIEW  COMPARISON:  09/24/2013  FINDINGS: Cardiac shadow is within normal limits. A hiatal hernia is again noted. A right chest wall port is seen with catheter tip at the cavoatrial junction. Postoperative changes are noted on the right. The lungs are well-aerated without focal infiltrate or sizable effusion.  IMPRESSION: No acute abnormality noted.   Electronically Signed   By: Inez Catalina M.D.   On: 11/26/2013 22:21   Ct Abdomen Pelvis W Contrast  11/26/2013   CLINICAL DATA:  Lung cancer and lymphoma, ongoing chemotherapy, no bowel movement for 6 days, abdominal pain, vomiting, cramping, history diverticulitis, atrial fibrillation  EXAM: CT ABDOMEN AND PELVIS WITH CONTRAST  TECHNIQUE: Multidetector CT imaging of the abdomen and pelvis was performed using the standard protocol following bolus administration of intravenous contrast. Sagittal and coronal MPR images reconstructed from axial data set.  CONTRAST:  Dilute oral contrast.  100 cc Omnipaque 300 IV  COMPARISON:  PET-CT 09/24/2013  FINDINGS: Minimal atelectasis at LEFT lower lobe.  Intrahepatic biliary dilatation post cholecystectomy with 11 mm CBD not significantly changed.  11 x 10 mm low to intermediate attenuation lesion lateral segment LEFT lobe stable since 2013.  Small LEFT renal cysts stable.  Liver, spleen, pancreas, kidneys, and adrenal glands otherwise normal.  Infiltration of fat planes at the region of the celiac axis and SMA origin unchanged.  Moderate-sized hiatal hernia.  Appendix not visualized.  Diverticulosis of descending and sigmoid colon with diffuse wall thickening and mild pericolic inflammatory changes of the sigmoid colon.  Hyperemia of sigmoid mesocolon.  Additionally, wall of remaining sigmoid colon and rectum  appears thickened.  It is uncertain whether the changes represent sigmoid diverticulitis or diffuse colitis of the rectosigmoid colon in a patient with coexistent chronic diverticular disease.  No evidence of perforation or abscess.  Stomach, small bowel loops and proximal colon otherwise unremarkable.  Scattered atherosclerotic calcifications.  Unremarkable bladder with mild prostatic enlargement.  No mass, adenopathy, free fluid, or free air.  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  Wall thickening of the sigmoid colon and rectum, with minimal pericolic infiltrative changes; findings could be due to sigmoid diverticulitis or diffuse colitis of the rectosigmoid colon in patient with chronic diverticular disease.  Involvement of the distal sigmoid colon and rectum favors distal colitis, which could be due to infection or inflammatory bowel disease, less likely ischemia by distribution.  No evidence of perforation, obstruction or abscess.   Electronically Signed   By: Lavonia Dana M.D.   On: 11/26/2013 22:47   Dg Abd Acute W/chest  12/07/2013   CLINICAL DATA:  Abdominal pain.  EXAM: ACUTE ABDOMEN SERIES (ABDOMEN 2 VIEW & CHEST 1 VIEW)  COMPARISON:  Chest radiograph November 26, 2013.  FINDINGS: There is no evidence of dilated bowel loops or free intraperitoneal air. No radiopaque calculi or other significant radiographic abnormality is seen. Heart size and mediastinal contours are within normal limits. Right internal jugular Port-A-Cath is unchanged with distal tip overlying expected position of the SVC. Moderate hiatal hernia is noted. Status post cholecystectomy. Both lungs are clear.  IMPRESSION: No evidence of bowel obstruction or ileus. No acute cardiopulmonary abnormality seen.   Electronically Signed   By: Sabino Dick M.D.   On: 12/07/2013 17:52    Physical Exam: BP 106/78 mmHg  Pulse 80  Ht 6' (1.829 m)  Wt 189  lb 12.8 oz (86.093 kg)  BMI 25.74 kg/m2 Constitutional: Pleasant,well-developed, white male in no acute distress. HEENT: Normocephalic and atraumatic. Conjunctivae are normal. No scleral icterus. Neck supple.  Cardiovascular: Normal rate, regular rhythm.  Pulmonary/chest: Effort normal and breath sounds normal. No wheezing, rales or rhonchi. Abdominal: Soft, nondistended, nontender. Bowel sounds active throughout. There are no masses palpable. No hepatomegaly. Extremities: no edema Lymphadenopathy: No cervical adenopathy noted. Neurological: Alert and oriented to person place and time. Skin: Skin is warm and dry. No rashes noted. Psychiatric: Normal mood and affect. Behavior is normal.   ASSESSMENT AND PLAN:  65, 68 year old male with lymphoma, status post chemotherapy. Last chemotherapy mid November. Due for PET scan later this month.  2. Recent diverticulitis involving the sigmoid. Pain has totally resolved following prolonged course of Cipro and Flagyl. There was some rectal thickening on CT scan as well. Patient reports a normal colonoscopy in ED in [place] 2--3 years ago. We will obtain colonoscopy report. Depending on findings patient may not need repeat colonoscopy. We will call him when records from [facility] arrive.

## 2013-12-18 NOTE — Patient Instructions (Signed)
Please notify us with the Dr's name or practice name of the office you had the last colonoscopy done at. Call here and ask for Devany Aja at [phone]  We sent a prescription to [facility] for the generic miralax for the colonoscopy prep. You have been scheduled for a colonoscopy. Please follow written instructions given to you at your visit today.  Please pick up your prep kit at the pharmacy within the next 1-3 days. [facility]. If you use inhalers (even only as needed), please bring them with you on the day of your procedure. Your physician has requested that you go to www.startemmi.com and enter the access code given to you at your visit today. This web site gives a general overview about your procedure. However, you should still follow specific instructions given to you by our office regarding your preparation for the procedure.

## 2013-12-19 ENCOUNTER — Ambulatory Visit: Payer: Medicare Other | Admitting: Family Medicine

## 2013-12-20 NOTE — Progress Notes (Addendum)
Agree with obtaining prior colonoscopy report. Seems unlikely that he would need a repeat CT without sxs. PET scan could be useful also.  [name], MD, [facility]   Addendum by Tye Savoy, NP 01/21/14 Received colonoscopy report from [facility] performed 11/25/10 by Dr. Doristine Mango extent of exam was to the cecum, good prep. Findings included left-sided moderate diverticulosis. No abnormalities of the rectum on rectal flexion. Repeat colonoscopy recommended 10 years.   Will forward to patient's primary GI, Dr. Carlean Purl.   Reviewed Would not pursue other w/u Did have thickened rectosigmoid on CTY abd/pelvis when constipated and on chemoTx Subsequent PET scan ok  [name], MD, [facility]

## 2013-12-21 ENCOUNTER — Other Ambulatory Visit: Payer: Self-pay | Admitting: Internal Medicine

## 2013-12-24 ENCOUNTER — Telehealth: Payer: Self-pay | Admitting: *Deleted

## 2013-12-24 NOTE — Telephone Encounter (Signed)
Marin Comment is requesting Colonoscopy report from patients doctor in [place], it looks like you where working on this based on AVS from office visit.

## 2013-12-26 ENCOUNTER — Encounter (HOSPITAL_COMMUNITY)
Admission: RE | Admit: 2013-12-26 | Discharge: 2013-12-26 | Disposition: A | Discharge status: Home / Self Care | Payer: Medicare Other | Source: Ambulatory Visit | Attending: Hematology and Oncology | Admitting: Hematology and Oncology

## 2013-12-26 ENCOUNTER — Ambulatory Visit: Payer: Medicare Other

## 2013-12-26 ENCOUNTER — Other Ambulatory Visit (HOSPITAL_BASED_OUTPATIENT_CLINIC_OR_DEPARTMENT_OTHER): Payer: Medicare Other

## 2013-12-26 DIAGNOSIS — C8510 Unspecified B-cell lymphoma, unspecified site: Secondary | ICD-10-CM

## 2013-12-26 DIAGNOSIS — C8590 Non-Hodgkin lymphoma, unspecified, unspecified site: Secondary | ICD-10-CM | POA: Diagnosis not present

## 2013-12-26 DIAGNOSIS — Z95828 Presence of other vascular implants and grafts: Secondary | ICD-10-CM

## 2013-12-26 LAB — CBC WITH DIFFERENTIAL/PLATELET
BASO%: 0.6 % (ref 0.0–2.0)
BASOS ABS: 0 10*3/uL (ref 0.0–0.1)
EOS%: 4.7 % (ref 0.0–7.0)
Eosinophils Absolute: 0.2 10*3/uL (ref 0.0–0.5)
HEMATOCRIT: 40.8 % (ref 38.4–49.9)
HGB: 14.2 g/dL (ref 13.0–17.1)
LYMPH#: 1.5 10*3/uL (ref 0.9–3.3)
LYMPH%: 32.8 % (ref 14.0–49.0)
MCH: 30.3 pg (ref 27.2–33.4)
MCHC: 34.8 g/dL (ref 32.0–36.0)
MCV: 87 fL (ref 79.3–98.0)
MONO#: 0.7 10*3/uL (ref 0.1–0.9)
MONO%: 15.4 % — ABNORMAL HIGH (ref 0.0–14.0)
NEUT#: 2.2 10*3/uL (ref 1.5–6.5)
NEUT%: 46.5 % (ref 39.0–75.0)
Platelets: 130 10*3/uL — ABNORMAL LOW (ref 140–400)
RBC: 4.69 10*6/uL (ref 4.20–5.82)
RDW: 13.3 % (ref 11.0–14.6)
WBC: 4.7 10*3/uL (ref 4.0–10.3)

## 2013-12-26 LAB — COMPREHENSIVE METABOLIC PANEL (CC13)
ALK PHOS: 55 U/L (ref 40–150)
ALT: 28 U/L (ref 0–55)
AST: 28 U/L (ref 5–34)
Albumin: 3.8 g/dL (ref 3.5–5.0)
Anion Gap: 10 mEq/L (ref 3–11)
BILIRUBIN TOTAL: 0.69 mg/dL (ref 0.20–1.20)
BUN: 14.5 mg/dL (ref 7.0–26.0)
CO2: 25 mEq/L (ref 22–29)
Calcium: 9.2 mg/dL (ref 8.4–10.4)
Chloride: 107 mEq/L (ref 98–109)
Creatinine: 0.9 mg/dL (ref 0.7–1.3)
EGFR: 88 mL/min/{1.73_m2} — ABNORMAL LOW (ref >90)
GLUCOSE: 107 mg/dL (ref 70–140)
POTASSIUM: 4.1 meq/L (ref 3.5–5.1)
Sodium: 142 mEq/L (ref 136–145)
Total Protein: 6.3 g/dL — ABNORMAL LOW (ref 6.4–8.3)

## 2013-12-26 LAB — GLUCOSE, CAPILLARY: Glucose-Capillary: 98 mg/dL (ref 70–99)

## 2013-12-26 IMAGING — CT NM PET TUM IMG RESTAG (PS) SKULL BASE T - THIGH
8 series · 25 of 25 positions shown · non-contrast
Comparison: [DATE]/.

CLINICAL DATA: Subsequent treatment strategy for low-grade
lymphoma.

EXAM:
NUCLEAR MEDICINE PET SKULL BASE TO THIGH
TECHNIQUE: 10.3 mCi F-18 FDG was injected intravenously. Full-ring PET imaging
was performed from the skull base to thigh after the radiotracer. CT
data was obtained and used for attenuation correction and anatomic
localization.
FASTING BLOOD GLUCOSE:  Value: 90 mg/dl

[Series 3: pet sk_thigh ac · axial · 5.0mm · 4.07mm/px · z∈[-1393,-537]mm · 4 of 215 slices shown]
[im 1/215]
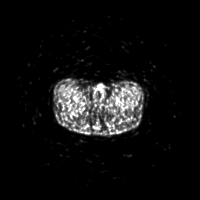
[im 72/215]
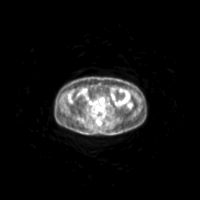
[im 143/215]
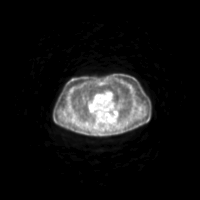
[im 215/215]
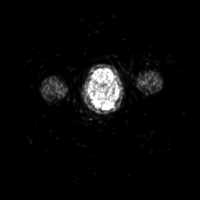

[Series 4: ct sk_thigh 5.0 b31f · axial · 5.0mm · 0.98mm/px · z∈[-1393,-537]mm · 5 of 212 slices shown]
[im 1/212]
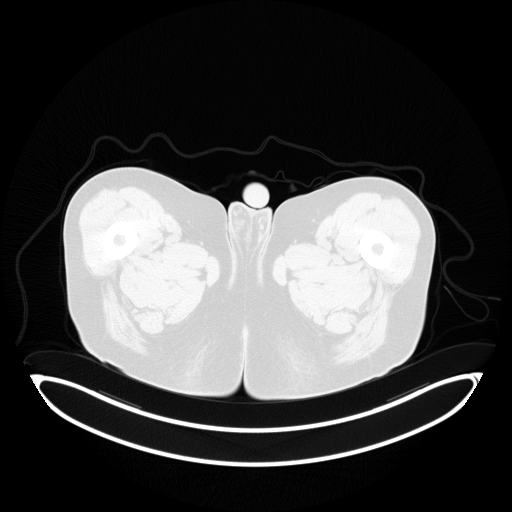
[im 53/212]
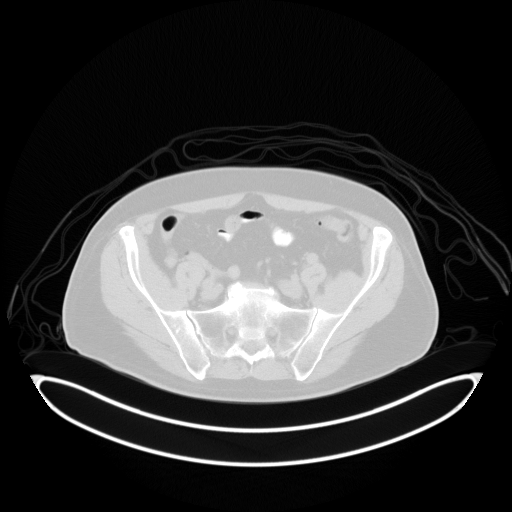
[im 106/212]
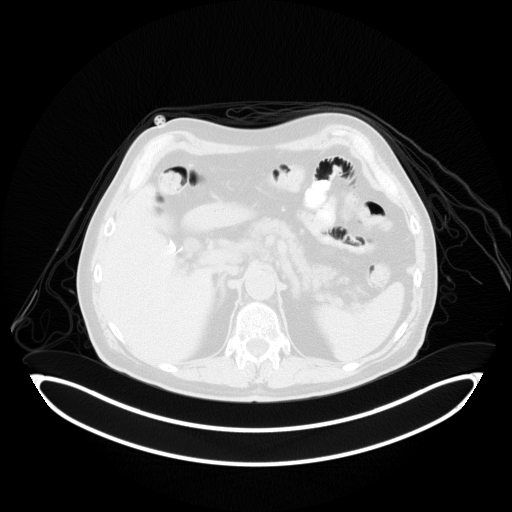
[im 159/212]
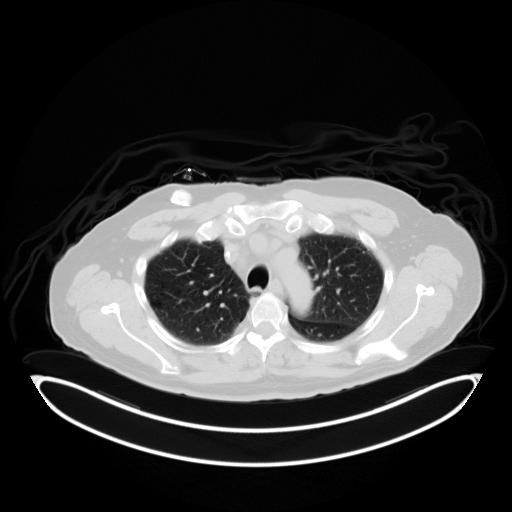
[im 212/212  brain]
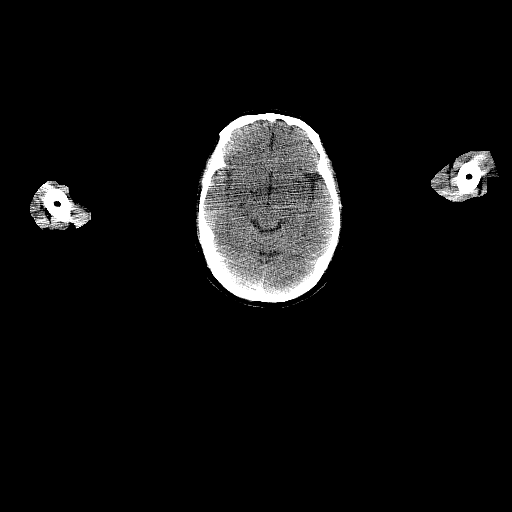

[Series 7: pet sk_thigh nac · axial · 5.0mm · 4.07mm/px · z∈[-1393,-537]mm · 5 of 215 slices shown]
[im 1/215]
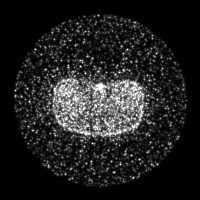
[im 54/215]
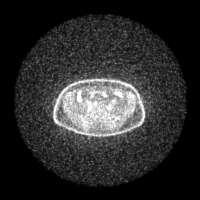
[im 108/215]
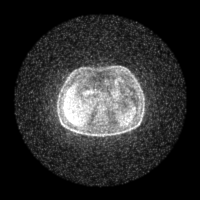
[im 161/215]
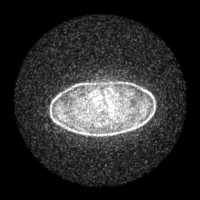
[im 215/215]
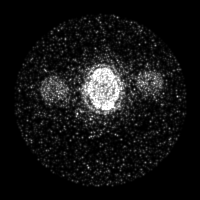

[Series 8: ct sk_thigh 5.0 b70f (id)_bone · axial · 5.0mm · 0.66mm/px · z∈[-951,-671]mm · 2 of 71 slices shown]
[im 1/71  bone]
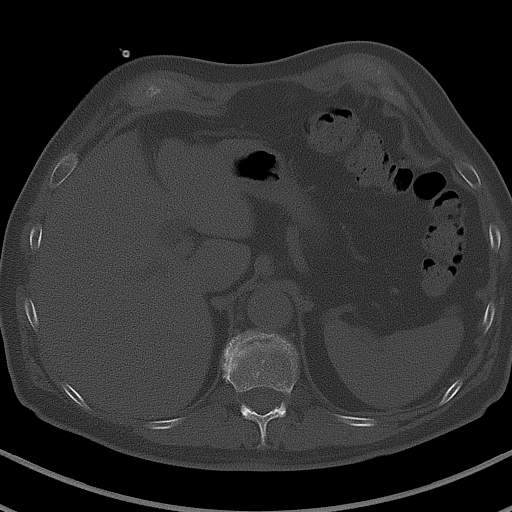
[im 71/71  bone]
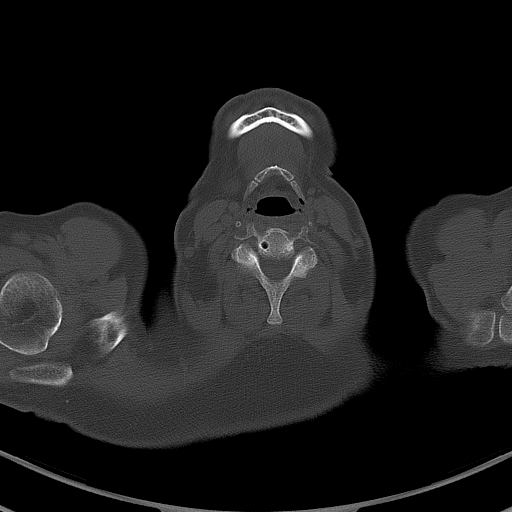

[Series 603: mip collection<mip range> · coronal · 1.78mm/px · 1 of 32 slices shown]
[im 1/32]
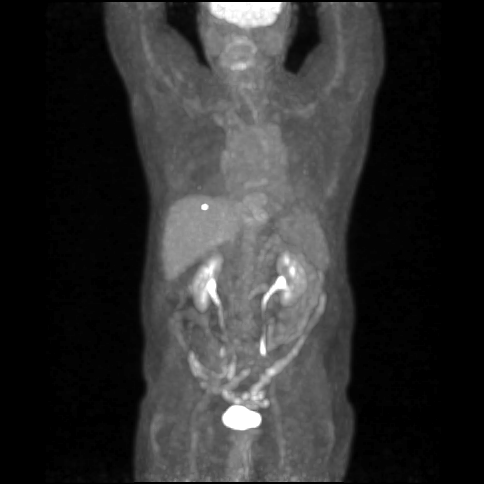

[Series 605: range-ct sk_thigh 5.0 (id)<alpha range(1)> · 2 of 66 slices shown]
[im 1/66]
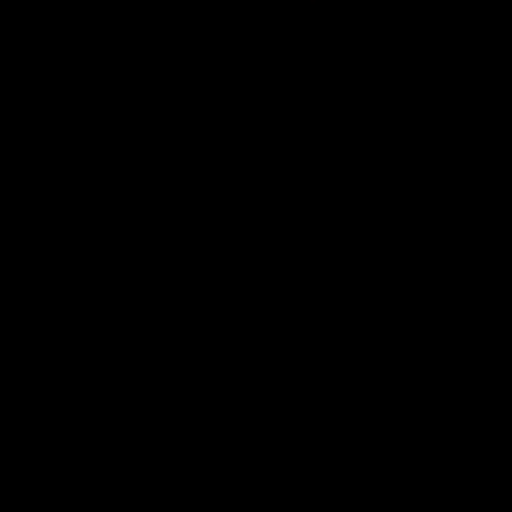
[im 66/66]
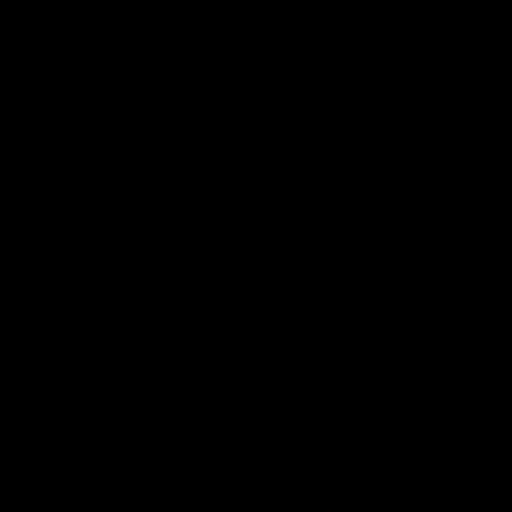

[Series 606: range-ct sk_thigh 5.0 (id)<alpha range> · 5 of 208 slices shown]
[im 1/208]
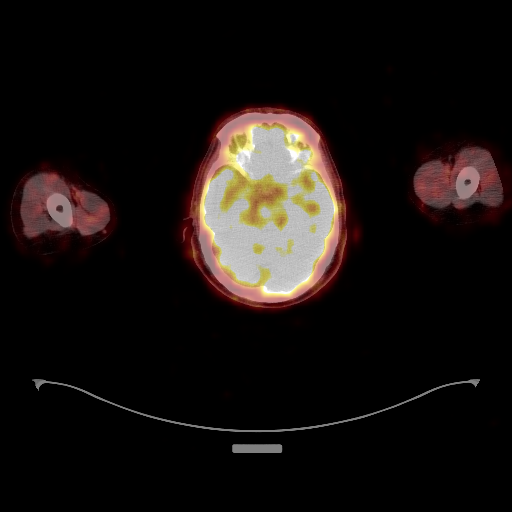
[im 52/208]
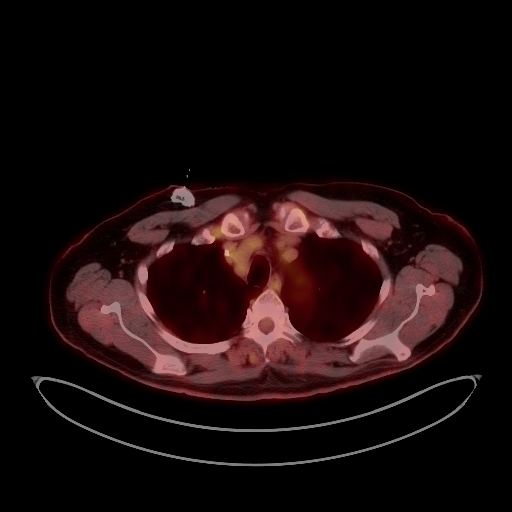
[im 104/208]
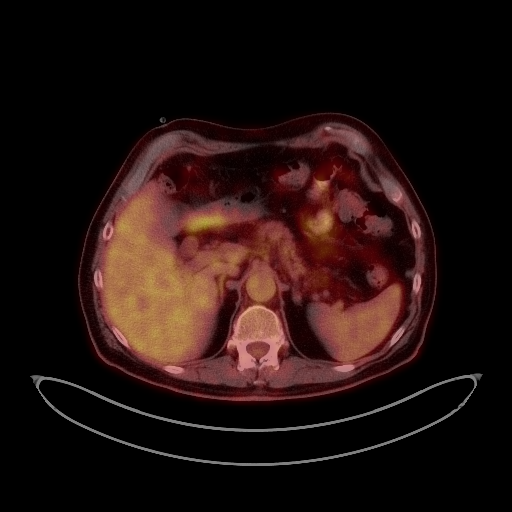
[im 156/208]
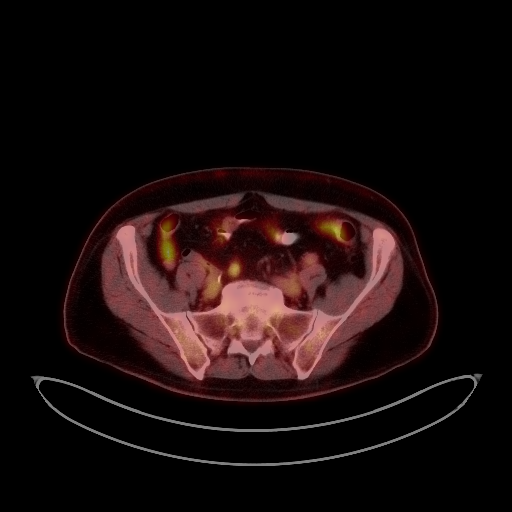
[im 208/208]
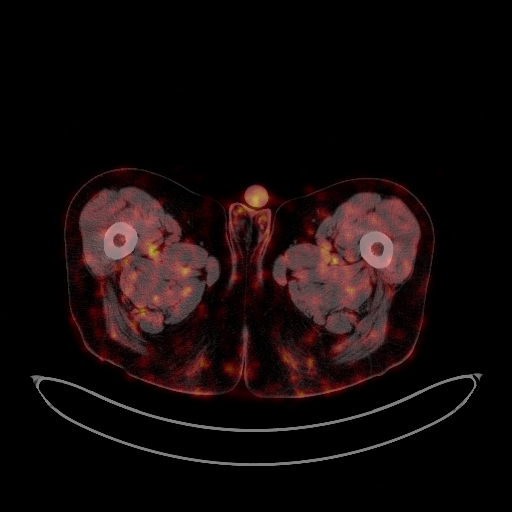

[Series 1236: results mm oncology reading · 4.0mm · 1.26mm/px · 1 of 1 slices shown]
[im 1/1]
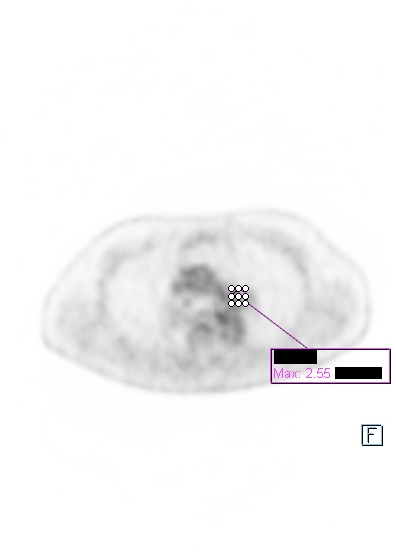

[25 of 25 positions shown; findings below may reference images not displayed]

FINDINGS: NECK

No hypermetabolic lymph nodes in the neck.

CHEST

Previously described node is stable measuring 6 mm in short axis.
SUV uptake is stable a 2.6 today compared to 2.9 previously. No
hypermetabolic lymphadenopathy in the chest.

Moderate hiatal hernia is again noted.

ABDOMEN/PELVIS

No abnormal hypermetabolic activity within the liver, pancreas,
adrenal glands, or spleen. No hypermetabolic lymph nodes in the
abdomen or pelvis.

Previously described lower right retrocrural lymph node (image 96
series 4) is stable at 6 mm short axis and shows no FDG uptake above
background soft tissue levels.

No lymphadenopathy in the abdomen. Haziness in the root of the small
bowel mesentery is unchanged and is nonspecific. Previously measured
mesenteric lymph node just caudal to the head of the pancreas is
seen today measuring 4 mm in short axis. This is unchanged from the
prior study. No hypermetabolism in this region.

6 mm in short axis left para-aortic lymph node (image 115 series 4)
has increased slightly in size in the interval. This particular
lymph node shows no uptake above background soft tissue levels.

Index right iliac lymph node measured previously at 6 mm short axis
is 5 mm in short axis today. No hypermetabolism associated.

There is diffuse diverticulosis of the left colon without
diverticulitis. Atherosclerotic calcification is seen in the
abdominal aorta without aneurysm. Prostate gland is enlarged.

SKELETON

No focal hypermetabolic activity to suggest skeletal metastasis.
IMPRESSION: No evidence for hypermetabolic lymphadenopathy in the chest,
abdomen, or pelvis to suggest recurrent disease. A normal left
para-aortic lymph node in the upper abdomen measures slightly larger
on today's study and while not hypermetabolic, attention to this
region on followup imaging is recommended.

No new or progressive findings.

## 2013-12-26 MED ORDER — SODIUM CHLORIDE 0.9 % IJ SOLN
10.0000 mL | INTRAMUSCULAR | Status: DC | PRN
  Administered 2013-12-26: 10 mL via INTRAVENOUS
  Filled 2013-12-26: qty 10

## 2013-12-26 MED ORDER — FLUDEOXYGLUCOSE F - 18 (FDG) INJECTION
10.3000 | Freq: Once | INTRAVENOUS | Status: AC | PRN
  Administered 2013-12-26: 10.3 via INTRAVENOUS

## 2013-12-26 MED ORDER — HEPARIN SOD (PORK) LOCK FLUSH 100 UNIT/ML IV SOLN
500.0000 [IU] | Freq: Once | INTRAVENOUS | Status: DC
  Administered 2013-12-26: 500 [IU] via INTRAVENOUS
  Filled 2013-12-26: qty 5

## 2013-12-26 NOTE — Patient Instructions (Signed)

## 2013-12-28 ENCOUNTER — Ambulatory Visit (HOSPITAL_BASED_OUTPATIENT_CLINIC_OR_DEPARTMENT_OTHER): Payer: Medicare Other | Admitting: Hematology and Oncology

## 2013-12-28 ENCOUNTER — Telehealth: Payer: Self-pay | Admitting: Hematology and Oncology

## 2013-12-28 ENCOUNTER — Encounter: Payer: Self-pay | Admitting: Hematology and Oncology

## 2013-12-28 VITALS — BP 113/67 | HR 81 | Temp 97.7°F | Resp 22 | Ht 72.0 in | Wt 191.6 lb

## 2013-12-28 DIAGNOSIS — R12 Heartburn: Secondary | ICD-10-CM

## 2013-12-28 DIAGNOSIS — C8510 Unspecified B-cell lymphoma, unspecified site: Secondary | ICD-10-CM

## 2013-12-28 MED ORDER — OMEPRAZOLE 20 MG PO CPDR: 20.0000 mg | DELAYED_RELEASE_CAPSULE | Freq: Every day | ORAL | Status: DC

## 2013-12-28 NOTE — Assessment & Plan Note (Signed)
Continue prilosec

## 2013-12-28 NOTE — Assessment & Plan Note (Signed)
He had complete response to Rx. Will remove port. Plan to see him in 6 month with repeat H&P and labs

## 2013-12-28 NOTE — Telephone Encounter (Signed)
gv pt appt schedule for june 2016. s/w stacey at [facility] IR and per stacey - Tiffany will contact pt w/appt. order in EPIC - pt aware.

## 2013-12-28 NOTE — Progress Notes (Signed)
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   12/12/2003 - 06/01/2004 Chemotherapy He was treated with R. CHOP chemotherapy which show complete remission. The number of cycles of R. CHOP chemotherapy was unknown.   12/19/2006 Surgery Lung resection show follicular lymphoma.   01/02/2007 - 09/01/2008 Chemotherapy The patient was treated with single agent rituximab alone.   01/19/2007 Bone Marrow Biopsy Bone marrow biopsy was negative.   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   05/08/2013 - 05/11/2013 Hospital Admission The patient was admitted to the hospital for management of pericarditis. CT scan showed extensive lymphadenopathy.   06/07/2013 Imaging PET/CT scan showed extensive lymphadenopathy   06/25/2013 Procedure He has placement of Infuse-a-Port.   06/28/2013 Bone Marrow Biopsy Bone marrow biopsy is positive for lymphoma involvement.   07/04/2013 - 11/22/2013 Chemotherapy He is treated with 6 cycles of bendamustine with rituximab.   09/24/2013 Imaging Repeat PET scan show complete remission.   12/26/2013 Imaging PEt scan showed complete remission    INTERVAL HISTORY: Please see below for problem oriented charting. He feels well. Denies recent flare of diverticulitis  REVIEW OF SYSTEMS:   Constitutional: Denies fevers, chills or abnormal weight loss Eyes: Denies blurriness of vision Ears, nose, mouth, throat, and face: Denies mucositis or sore throat Respiratory: Denies cough,  dyspnea or wheezes Cardiovascular: Denies palpitation, chest discomfort or lower extremity swelling Gastrointestinal:  Denies nausea, heartburn or change in bowel habits Skin: Denies abnormal skin rashes Lymphatics: Denies new lymphadenopathy or easy bruising Neurological:Denies numbness, tingling or new weaknesses Behavioral/Psych: Mood is stable, no new changes  All other systems were reviewed with the patient and are negative.  I have reviewed the past medical history, past surgical history, social history and family history with the patient and they are unchanged from previous note.  ALLERGIES:  has No Known Allergies.  MEDICATIONS:  Current Outpatient Prescriptions  Medication Sig Dispense Refill  . aspirin EC 81 MG tablet Take 81 mg by mouth every morning.    Marland Kitchen atorvastatin (LIPITOR) 10 MG tablet Take 5 mg by mouth every evening.     . diltiazem (CARDIZEM CD) 120 MG 24 hr capsule Take 1 capsule (120 mg total) by mouth every morning. (Patient taking differently: Take 120 mg by mouth daily. In the evening) 90 capsule 3  . metoprolol succinate (TOPROL-XL) 25 MG 24 hr tablet TAKE 1 TABLET DAILY 90 tablet 3  . Multiple Vitamin (MULTIVITAMIN WITH MINERALS) TABS tablet Take 1 tablet by mouth daily.    Marland Kitchen omeprazole (PRILOSEC) 20 MG capsule Take 1 capsule (20 mg total) by mouth daily. 90 capsule 3  . Probiotic Product (PROBIOTIC DAILY PO) Take 2 capsules by mouth daily.    . cycloSPORINE (RESTASIS) 0.05 % ophthalmic emulsion Place 1 drop into both eyes 2 (two) times daily.    . ondansetron (ZOFRAN) 8 MG tablet Take 1 tablet (8 mg total) by mouth every 8 (eight) hours as needed for nausea. (Patient not taking: Reported  on 12/28/2013) 30 tablet 3  . polyethylene glycol powder (GLYCOLAX/MIRALAX) powder Take as directed for colonoscopy prep. (Patient not taking: Reported on 12/28/2013) 255 g 0  . promethazine (PHENERGAN) 25 MG tablet Take 1 tablet (25 mg total) by mouth every 6 (six) hours as  needed for nausea. (Patient not taking: Reported on 12/28/2013) 30 tablet 3   No current facility-administered medications for this visit.    PHYSICAL EXAMINATION: ECOG PERFORMANCE STATUS: 0 - Asymptomatic  Filed Vitals:   12/28/13 0928  BP: 113/67  Pulse: 81  Temp: 97.7 F (36.5 C)  Resp: 22   Filed Weights   12/28/13 0928  Weight: 191 lb 9.6 oz (86.909 kg)    GENERAL:alert, no distress and comfortable SKIN: skin color, texture, turgor are normal, no rashes or significant lesions EYES: normal, Conjunctiva are pink and non-injected, sclera clear OROPHARYNX:no exudate, no erythema and lips, buccal mucosa, and tongue normal  NECK: supple, thyroid normal size, non-tender, without nodularity LYMPH:  no palpable lymphadenopathy in the cervical, axillary or inguinal LUNGS: clear to auscultation and percussion with normal breathing effort HEART: regular rate & rhythm and no murmurs and no lower extremity edema ABDOMEN:abdomen soft, non-tender and normal bowel sounds Musculoskeletal:no cyanosis of digits and no clubbing  NEURO: alert & oriented x 3 with fluent speech, no focal motor/sensory deficits  LABORATORY DATA:  I have reviewed the data as listed    Component Value Date/Time   NA 142 12/26/2013 0802   NA 144 12/07/2013 1447   NA 139 07/20/2011 0800   K 4.1 12/26/2013 0802   K 5.2 12/07/2013 1447   K 4.4 07/20/2011 0800   CL 105 12/07/2013 1447   CL 104 03/24/2012 1024   CL 100 07/20/2011 0800   CO2 25 12/26/2013 0802   CO2 26 12/07/2013 1447   CO2 29 07/20/2011 0800   GLUCOSE 107 12/26/2013 0802   GLUCOSE 108* 12/07/2013 1447   GLUCOSE 80 03/24/2012 1024   GLUCOSE 100 07/20/2011 0800   BUN 14.5 12/26/2013 0802   BUN 10 12/07/2013 1447   BUN 14 07/20/2011 0800   CREATININE 0.9 12/26/2013 0802   CREATININE 1.04 12/07/2013 1447   CREATININE 1.2 07/20/2011 0800   CALCIUM 9.2 12/26/2013 0802   CALCIUM 9.3 12/07/2013 1447   CALCIUM 8.7 07/20/2011 0800   PROT 6.3*  12/26/2013 0802   PROT 6.6 12/07/2013 1447   PROT 7.2 07/20/2011 0800   ALBUMIN 3.8 12/26/2013 0802   ALBUMIN 3.6 12/07/2013 1447   AST 28 12/26/2013 0802   AST 31 12/07/2013 1447   AST 25 07/20/2011 0800   ALT 28 12/26/2013 0802   ALT 37 12/07/2013 1447   ALT 29 07/20/2011 0800   ALKPHOS 55 12/26/2013 0802   ALKPHOS 59 12/07/2013 1447   ALKPHOS 55 07/20/2011 0800   BILITOT 0.69 12/26/2013 0802   BILITOT 0.4 12/07/2013 1447   BILITOT 1.00 07/20/2011 0800   GFRNONAA 72* 12/07/2013 1447   GFRAA 83* 12/07/2013 1447    No results found for: SPEP, UPEP  Lab Results  Component Value Date   WBC 4.7 12/26/2013   NEUTROABS 2.2 12/26/2013   HGB 14.2 12/26/2013   HCT 40.8 12/26/2013   MCV 87.0 12/26/2013   PLT 130* 12/26/2013      Chemistry      Component Value Date/Time   NA 142 12/26/2013 0802   NA 144 12/07/2013 1447   NA 139 07/20/2011 0800   K 4.1 12/26/2013 0802   K 5.2 12/07/2013  1447   K 4.4 07/20/2011 0800   CL 105 12/07/2013 1447   CL 104 03/24/2012 1024   CL 100 07/20/2011 0800   CO2 25 12/26/2013 0802   CO2 26 12/07/2013 1447   CO2 29 07/20/2011 0800   BUN 14.5 12/26/2013 0802   BUN 10 12/07/2013 1447   BUN 14 07/20/2011 0800   CREATININE 0.9 12/26/2013 0802   CREATININE 1.04 12/07/2013 1447   CREATININE 1.2 07/20/2011 0800      Component Value Date/Time   CALCIUM 9.2 12/26/2013 0802   CALCIUM 9.3 12/07/2013 1447   CALCIUM 8.7 07/20/2011 0800   ALKPHOS 55 12/26/2013 0802   ALKPHOS 59 12/07/2013 1447   ALKPHOS 55 07/20/2011 0800   AST 28 12/26/2013 0802   AST 31 12/07/2013 1447   AST 25 07/20/2011 0800   ALT 28 12/26/2013 0802   ALT 37 12/07/2013 1447   ALT 29 07/20/2011 0800   BILITOT 0.69 12/26/2013 0802   BILITOT 0.4 12/07/2013 1447   BILITOT 1.00 07/20/2011 0800       RADIOGRAPHIC STUDIES:I reviewed scans with him and wife I have personally reviewed the radiological images as listed and agreed with the findings in the  report.  ASSESSMENT & PLAN:  Low grade B-cell lymphoma He had complete response to Rx. Will remove port. Plan to see him in 6 month with repeat H&P and labs  Heartburn Continue prilosec   Orders Placed This Encounter  Procedures  . IR Removal Tun Access W/ Port W/O FL    Standing Status: Future     Number of Occurrences:      Standing Expiration Date: 03/01/2015    Order Specific Question:  Reason for exam:    Answer:  no need port    Order Specific Question:  Preferred Imaging Location?    Answer:  [facility]  . Lactate dehydrogenase    Standing Status: Future     Number of Occurrences:      Standing Expiration Date: 02/01/2015   All questions were answered. The patient knows to call the clinic with any problems, questions or concerns. No barriers to learning was detected. I spent 25 minutes counseling the patient face to face. The total time spent in the appointment was 30 minutes and more than 50% was on counseling and review of test results     [facility], [place], MD 12/28/2013 10:27 AM

## 2014-01-05 ENCOUNTER — Telehealth: Payer: Self-pay | Admitting: Gastroenterology

## 2014-01-05 ENCOUNTER — Other Ambulatory Visit: Payer: Self-pay | Admitting: Gastroenterology

## 2014-01-05 MED ORDER — METRONIDAZOLE 500 MG PO TABS: 500.0000 mg | ORAL_TABLET | Freq: Three times a day (TID) | ORAL | Status: DC

## 2014-01-05 MED ORDER — CIPROFLOXACIN HCL 500 MG PO TABS: 500.0000 mg | ORAL_TABLET | Freq: Two times a day (BID) | ORAL | Status: DC

## 2014-01-05 NOTE — Telephone Encounter (Signed)
Pt having recurrent lower abdominal pain and nausea similar to prior recent bout of acute diverticulitis.  No fever.  Plan to start antibiotics for presumed recurrent diverticulitis.  Pt was instructed to begin clear liquids, contact office if symptoms do not improve next 24-48 hours.

## 2014-01-07 ENCOUNTER — Other Ambulatory Visit: Payer: Self-pay | Admitting: Radiology

## 2014-01-08 ENCOUNTER — Encounter (HOSPITAL_COMMUNITY): Payer: Self-pay

## 2014-01-08 ENCOUNTER — Ambulatory Visit (HOSPITAL_COMMUNITY)
Admission: RE | Admit: 2014-01-08 | Discharge: 2014-01-08 | Disposition: A | Discharge status: Home / Self Care | Payer: Medicare Other | Source: Ambulatory Visit | Attending: Hematology and Oncology | Admitting: Hematology and Oncology

## 2014-01-08 DIAGNOSIS — C8510 Unspecified B-cell lymphoma, unspecified site: Secondary | ICD-10-CM

## 2014-01-08 DIAGNOSIS — Z452 Encounter for adjustment and management of vascular access device: Secondary | ICD-10-CM | POA: Insufficient documentation

## 2014-01-08 DIAGNOSIS — C8590 Non-Hodgkin lymphoma, unspecified, unspecified site: Secondary | ICD-10-CM | POA: Insufficient documentation

## 2014-01-08 DIAGNOSIS — Z7982 Long term (current) use of aspirin: Secondary | ICD-10-CM | POA: Insufficient documentation

## 2014-01-08 DIAGNOSIS — I4891 Unspecified atrial fibrillation: Secondary | ICD-10-CM | POA: Insufficient documentation

## 2014-01-08 DIAGNOSIS — Z87891 Personal history of nicotine dependence: Secondary | ICD-10-CM | POA: Diagnosis not present

## 2014-01-08 DIAGNOSIS — M1990 Unspecified osteoarthritis, unspecified site: Secondary | ICD-10-CM | POA: Diagnosis not present

## 2014-01-08 DIAGNOSIS — I2510 Atherosclerotic heart disease of native coronary artery without angina pectoris: Secondary | ICD-10-CM | POA: Diagnosis not present

## 2014-01-08 LAB — CBC WITH DIFFERENTIAL/PLATELET
BASOS ABS: 0 10*3/uL (ref 0.0–0.1)
Basophils Relative: 1 % (ref 0–1)
Eosinophils Absolute: 0.2 10*3/uL (ref 0.0–0.7)
Eosinophils Relative: 4 % (ref 0–5)
HEMATOCRIT: 42 % (ref 39.0–52.0)
HEMOGLOBIN: 14.3 g/dL (ref 13.0–17.0)
LYMPHS ABS: 1.5 10*3/uL (ref 0.7–4.0)
Lymphocytes Relative: 35 % (ref 12–46)
MCH: 30.2 pg (ref 26.0–34.0)
MCHC: 34 g/dL (ref 30.0–36.0)
MCV: 88.6 fL (ref 78.0–100.0)
MONO ABS: 1 10*3/uL (ref 0.1–1.0)
Monocytes Relative: 23 % — ABNORMAL HIGH (ref 3–12)
NEUTROS PCT: 37 % — AB (ref 43–77)
Neutro Abs: 1.6 10*3/uL — ABNORMAL LOW (ref 1.7–7.7)
Platelets: 152 10*3/uL (ref 150–400)
RBC: 4.74 MIL/uL (ref 4.22–5.81)
RDW: 13.3 % (ref 11.5–15.5)
WBC: 4.3 10*3/uL (ref 4.0–10.5)

## 2014-01-08 LAB — PROTIME-INR
INR: 1.16 (ref 0.00–1.49)
Prothrombin Time: 14.9 seconds (ref 11.6–15.2)

## 2014-01-08 IMAGING — XA IR REMOVAL TUNNELED CV CATH W/PORT/PUMP
1 series · 1 of 1 positions shown · non-contrast
Comparison: none

CLINICAL DATA: Lymphoma, post chemotherapy. Port catheter no longer
needed.

EXAM:
EXAM
TUNNELED PORT CATHETER REMOVAL
TECHNIQUE: The procedure, risks (including but not limited to bleeding,
infection, organ damage ), benefits, and alternatives were explained
to the patient. Questions regarding the procedure were encouraged
and answered. The patient understands and consents to the procedure.

[Series 1: care single · 1 of 1 slices shown]
[im 1/1]
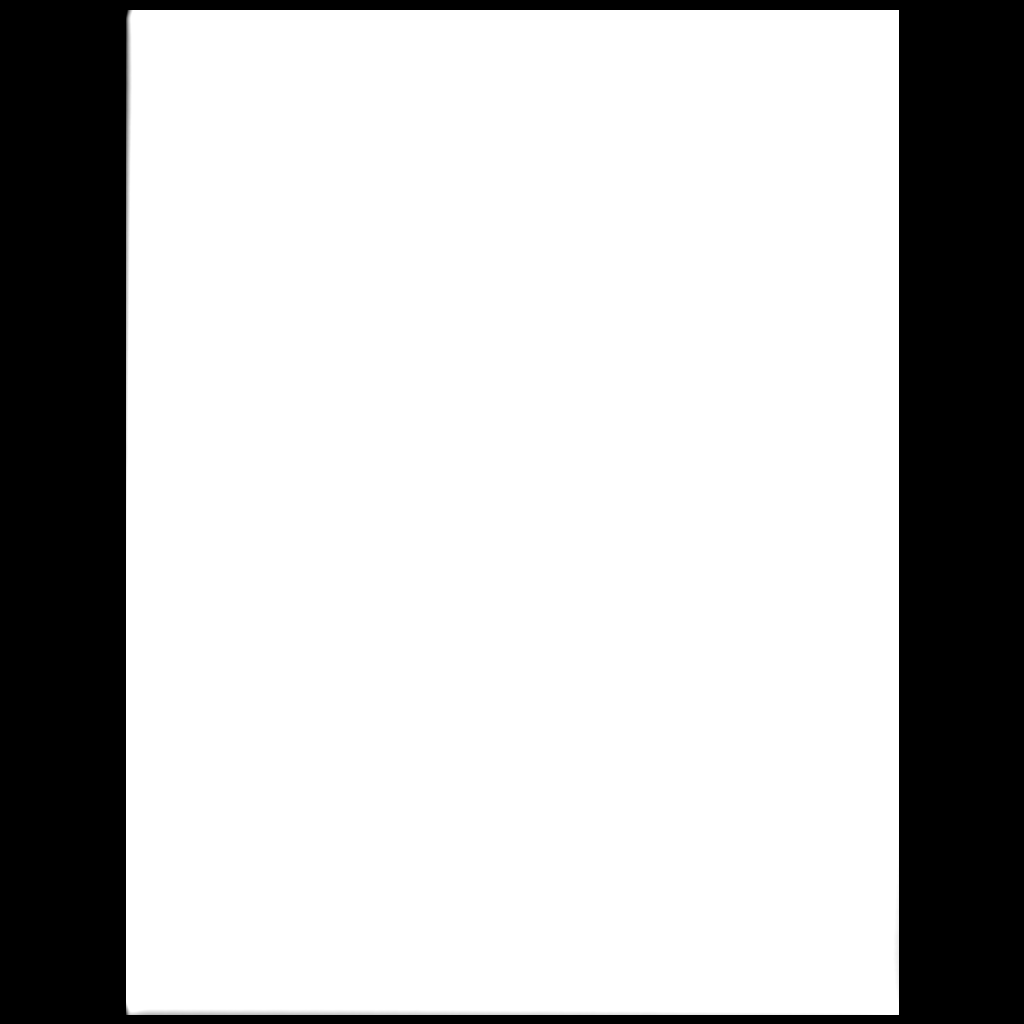

[1 of 1 positions shown; findings below may reference images not displayed]

Intravenous Fentanyl and Versed were administered as conscious
sedation during continuous cardiorespiratory monitoring by the
radiology RN, with a total moderate sedation time of 14 minutes.

Overlying skin prepped with chlorhexidine, draped in usual sterile
fashion, infiltrated locally with 1% lidocaine. A small incision was
made over the scar from previous placement. The port catheter was
dissected free from the underlying soft tissues and removed intact.
Hemostasis was achieved. The port pocket was closed with deep
interrupted and subcuticular continuous 3-0 Monocryl sutures, then
covered with Dermabond. The patient tolerated the procedure well.

COMPLICATIONS:
COMPLICATIONS
None immediate
IMPRESSION: 1.  Technically successful tunneled Port catheter removal.

## 2014-01-08 MED ORDER — FENTANYL CITRATE 0.05 MG/ML IJ SOLN
INTRAMUSCULAR | Status: AC
  Filled 2014-01-08: qty 4

## 2014-01-08 MED ORDER — MIDAZOLAM HCL 2 MG/2ML IJ SOLN
INTRAMUSCULAR | Status: AC
  Filled 2014-01-08: qty 6

## 2014-01-08 MED ORDER — HYDROCODONE-ACETAMINOPHEN 5-325 MG PO TABS: 1.0000 | ORAL_TABLET | ORAL | Status: DC | PRN

## 2014-01-08 MED ORDER — CEFAZOLIN SODIUM-DEXTROSE 2-3 GM-% IV SOLR
2.0000 g | Freq: Once | INTRAVENOUS | Status: AC
  Administered 2014-01-08: 2 g via INTRAVENOUS

## 2014-01-08 MED ORDER — SODIUM CHLORIDE 0.9 % IV SOLN
INTRAVENOUS | Status: DC
  Administered 2014-01-08: 12:00:00 via INTRAVENOUS

## 2014-01-08 MED ORDER — MIDAZOLAM HCL 2 MG/2ML IJ SOLN
INTRAMUSCULAR | Status: AC | PRN
  Administered 2014-01-08 (×3): 1 mg via INTRAVENOUS

## 2014-01-08 MED ORDER — LIDOCAINE HCL 1 % IJ SOLN
INTRAMUSCULAR | Status: AC
  Filled 2014-01-08: qty 20

## 2014-01-08 MED ORDER — CEFAZOLIN SODIUM-DEXTROSE 2-3 GM-% IV SOLR
INTRAVENOUS | Status: AC
  Administered 2014-01-08: 2 g via INTRAVENOUS
  Filled 2014-01-08: qty 50

## 2014-01-08 MED ORDER — FENTANYL CITRATE 0.05 MG/ML IJ SOLN
INTRAMUSCULAR | Status: AC | PRN
  Administered 2014-01-08 (×3): 25 ug via INTRAVENOUS

## 2014-01-08 NOTE — Discharge Instructions (Signed)
Incision Care °An incision is when a surgeon cuts into your body tissues. After surgery, the incision needs to be cared for properly to prevent infection.  °HOME CARE INSTRUCTIONS  °· Take all medicine as directed by your caregiver. Only take over-the-counter or prescription medicines for pain, discomfort, or fever as directed by your caregiver. °· Do not remove your bandage (dressing) or get your incision wet until your surgeon gives you permission. In the event that your dressing becomes wet, dirty, or starts to smell, change the dressing and call your surgeon for instructions as soon as possible. °· Take showers. Do not take tub baths, swim, or do anything that may soak the wound until it is healed. °· Resume your normal diet and activities as directed or allowed. °· Avoid lifting any weight until you are instructed otherwise. °· Use anti-itch antihistamine medicine as directed by your caregiver. The wound may itch when it is healing. Do not pick or scratch at the wound. °· Follow up with your caregiver for stitch (suture) or staple removal as directed. °· Drink enough fluids to keep your urine clear or pale yellow. °SEEK MEDICAL CARE IF:  °· You have redness, swelling, or increasing pain in the wound that is not controlled with medicine. °· You have drainage, blood, or pus coming from the wound that lasts longer than 1 day. °· You develop muscle aches, chills, or a general ill feeling. °· You notice a bad smell coming from the wound or dressing. °· Your wound edges separate after the sutures, staples, or skin adhesive strips have been removed. °· You develop persistent nausea or vomiting. °SEEK IMMEDIATE MEDICAL CARE IF:  °· You have a fever. °· You develop a rash. °· You develop dizzy episodes or faint while standing. °· You have difficulty breathing. °· You develop any reaction or side effects to medicine given. °MAKE SURE YOU:  °· Understand these instructions. °· Will watch your condition. °· Will get help  right away if you are not doing well or get worse. °Document Released: 07/17/2004 Document Revised: 03/22/2011 Document Reviewed: 02/21/2013 °ExitCare® Patient Information ©2015 [employer]. This information is not intended to replace advice given to you by your health care provider. Make sure you discuss any questions you have with your health care provider. °Conscious Sedation °Sedation is the use of medicines to promote relaxation and relieve discomfort and anxiety. Conscious sedation is a type of sedation. Under conscious sedation you are less alert than normal but are still able to respond to instructions or stimulation. Conscious sedation is used during short medical and dental procedures. It is milder than deep sedation or general anesthesia and allows you to return to your regular activities sooner.  °LET YOUR HEALTH CARE PROVIDER KNOW ABOUT:  °· Any allergies you have. °· All medicines you are taking, including vitamins, herbs, eye drops, creams, and over-the-counter medicines. °· Use of steroids (by mouth or creams). °· Previous problems you or members of your family have had with the use of anesthetics. °· Any blood disorders you have. °· Previous surgeries you have had. °· Medical conditions you have. °· Possibility of pregnancy, if this applies. °· Use of cigarettes, alcohol, or illegal drugs. °RISKS AND COMPLICATIONS °Generally, this is a safe procedure. However, as with any procedure, problems can occur. Possible problems include: °· Oversedation. °· Trouble breathing on your own. You may need to have a breathing tube until you are awake and breathing on your own. °· Allergic reaction to any of   the medicines used for the procedure. °BEFORE THE PROCEDURE °· You may have blood tests done. These tests can help show how well your kidneys and liver are working. They can also show how well your blood clots. °· A physical exam will be done.   °· Only take medicines as directed by your health care provider.  You may need to stop taking medicines (such as blood thinners, aspirin, or nonsteroidal anti-inflammatory drugs) before the procedure.   °· Do not eat or drink at least 6 hours before the procedure or as directed by your health care provider. °· Arrange for a responsible adult, family member, or friend to take you home after the procedure. He or she should stay with you for at least 24 hours after the procedure, until the medicine has worn off. °PROCEDURE  °· An intravenous (IV) catheter will be inserted into one of your veins. Medicine will be able to flow directly into your body through this catheter. You may be given medicine through this tube to help prevent pain and help you relax. °· The medical or dental procedure will be done. °AFTER THE PROCEDURE °· You will stay in a recovery area until the medicine has worn off. Your blood pressure and pulse will be checked.   °·  Depending on the procedure you had, you may be allowed to go home when you can tolerate liquids and your pain is under control. °Document Released: 09/22/2000 Document Revised: 01/02/2013 Document Reviewed: 09/04/2012 °ExitCare® Patient Information ©2015 [employer]. This information is not intended to replace advice given to you by your health care provider. Make sure you discuss any questions you have with your health care provider. ° °

## 2014-01-08 NOTE — Procedures (Signed)
R IJ Port Removal No complication No blood loss. See complete dictation in [facility] PACS.  

## 2014-01-08 NOTE — H&P (Signed)
Chief Complaint: "I'm here for a port removal" Referring Physician:Gorsuch HPI: Anthony Kelly is an 68 y.o. male with hx of lymphoma. He had port placed this past June but has completed treatment. He is scheduled today for port removal. Chart, PMHx, meds reviewed.  Past Medical History:  Past Medical History  Diagnosis Date  . PSVT (paroxysmal supraventricular tachycardia)   . Arthritis   . 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 Metastasis to lymph nodes dx'd 03/2011    lt submandibular ln  . Atrial fibrillation     a. Isolated episode in the setting of acute pericarditis 04/2013. Was not placed on anticoag.  Marland Kitchen Acute pericarditis     a. 04/2013 -adm with CP, elevated CRP. H/o coronary artery calcification on prior CT but nuc was negative, EF 71%.  . Pain in joint, pelvic region and thigh 08/01/2013  . Diverticulitis 08/16/2013    Past Surgical History:  Past Surgical History  Procedure Laterality Date  . Lung lobectomy      right side  . Cholecystectomy    . Lymph node biopsy      in groin with removal  . Meniscus repair      right knee  . Vein ligation and stripping      right leg  . Tonsillectomy      as a child  . Submandibular gland excision  04/2011  . Submandibular gland excision  04/30/2011    Procedure: EXCISION SUBMANDIBULAR GLAND;  Surgeon: Jerrell Belfast, MD;  Location: [facility]  Service: ENT;  Laterality: Left;  WITH DIAGNOSTIC BIOPSY  . Exploratory laparotomy      Family History:  Family History  Problem Relation Age of Onset  . Anesthesia problems Neg Hx   . Heart disease Father   . Cancer Sister     breast ca  . Cancer Brother     prostate ca    Social History:   reports that he quit smoking about 24 years ago. He has never used smokeless tobacco. He reports that he does not drink alcohol or use illicit drugs.  Allergies: No Known Allergies  Medications:   Medication List    ASK your doctor about these medications        acetaminophen 500 MG tablet  Commonly known as:  TYLENOL  Take 1,000 mg by mouth every 4 (four) hours as needed for moderate pain or headache.     aspirin EC 81 MG tablet  Take 81 mg by mouth every morning.     atorvastatin 10 MG tablet  Commonly known as:  LIPITOR  Take 5 mg by mouth every evening.     ciprofloxacin 500 MG tablet  Commonly known as:  CIPRO  Take 1 tablet (500 mg total) by mouth 2 (two) times daily.     diltiazem 120 MG 24 hr capsule  Commonly known as:  CARDIZEM CD  Take 1 capsule (120 mg total) by mouth every morning.     hyoscyamine 0.125 MG Tbdp disintergrating tablet  Commonly known as:  ANASPAZ  Place 0.125 mg under the tongue every 6 (six) hours as needed for bladder spasms or cramping.     metoprolol succinate 25 MG  24 hr tablet  Commonly known as:  TOPROL-XL  TAKE 1 TABLET DAILY     metroNIDAZOLE 500 MG tablet  Commonly known as:  FLAGYL  Take 1 tablet (500 mg total) by mouth 3 (three) times daily.     multivitamin with minerals Tabs tablet  Take 1 tablet by mouth every morning.     omeprazole 20 MG capsule  Commonly known as:  PRILOSEC  Take 1 capsule (20 mg total) by mouth daily.     ondansetron 8 MG tablet  Commonly known as:  ZOFRAN  Take 1 tablet (8 mg total) by mouth every 8 (eight) hours as needed for nausea.     polyethylene glycol powder powder  Commonly known as:  GLYCOLAX/MIRALAX  Take as directed for colonoscopy prep.     PROBIOTIC DAILY PO  Take 1 capsule by mouth 3 (three) times daily.     promethazine 25 MG tablet  Commonly known as:  PHENERGAN  Take 1 tablet (25 mg total) by mouth every 6 (six) hours as needed for nausea.     REFRESH OP  Place 1 drop  into both eyes 2 (two) times daily.        Please HPI for pertinent positives, otherwise complete 10 system ROS negative.  Physical Exam: Ht 6' (1.829 m)  Wt 181 lb (82.101 kg)  BMI 24.54 kg/m2 Body mass index is 24.54 kg/(m^2).   General Appearance:  Alert, cooperative, no distress, appears stated age  Head:  Normocephalic, without obvious abnormality, atraumatic  ENT: Unremarkable  Neck: Supple, symmetrical, trachea midline  Lungs:   Clear to auscultation bilaterally, no w/r/r, respirations unlabored without use of accessory muscles.  Chest Wall:  No tenderness or deformity. (R)chest port palpable, no issues  Heart:  Regular rate and rhythm, S1, S2 normal, no murmur, rub or gallop.  Neurologic: Normal affect, no gross deficits.  Labs: Results for orders placed or performed during the hospital encounter of 01/08/14 (from the past 48 hour(s))  CBC with Differential     Status: Abnormal   Collection Time: 01/08/14 12:00 PM  Result Value Ref Range   WBC 4.3 4.0 - 10.5 K/uL   RBC 4.74 4.22 - 5.81 MIL/uL   Hemoglobin 14.3 13.0 - 17.0 g/dL   HCT 42.0 39.0 - 52.0 %   MCV 88.6 78.0 - 100.0 fL   MCH 30.2 26.0 - 34.0 pg   MCHC 34.0 30.0 - 36.0 g/dL   RDW 13.3 11.5 - 15.5 %   Platelets 152 150 - 400 K/uL   Neutrophils Relative % 37 (L) 43 - 77 %   Neutro Abs 1.6 (L) 1.7 - 7.7 K/uL   Lymphocytes Relative 35 12 - 46 %   Lymphs Abs 1.5 0.7 - 4.0 K/uL   Monocytes Relative 23 (H) 3 - 12 %   Monocytes Absolute 1.0 0.1 - 1.0 K/uL   Eosinophils Relative 4 0 - 5 %   Eosinophils Absolute 0.2 0.0 - 0.7 K/uL   Basophils Relative 1 0 - 1 %   Basophils Absolute 0.0 0.0 - 0.1 K/uL  Protime-INR     Status: None   Collection Time: 01/08/14 12:00 PM  Result Value Ref Range   Prothrombin Time 14.9 11.6 - 15.2 seconds   INR 1.16 0.00 - 1.49    Imaging: No results found.  Assessment/Plan Lymphoma For Port removal Explained procedure, risks, complications Labs ok Consent signed in  chart  Ascencion Dike PA-C 01/08/2014, 1:14 PM

## 2014-01-11 HISTORY — PX: EYE SURGERY: SHX253

## 2014-01-18 ENCOUNTER — Encounter: Payer: Self-pay | Admitting: Pharmacist

## 2014-01-18 ENCOUNTER — Ambulatory Visit (INDEPENDENT_AMBULATORY_CARE_PROVIDER_SITE_OTHER): Payer: Medicare Other | Admitting: Pharmacist

## 2014-01-18 VITALS — BP 106/70 | HR 74 | Ht 72.0 in | Wt 188.0 lb

## 2014-01-18 DIAGNOSIS — E785 Hyperlipidemia, unspecified: Secondary | ICD-10-CM | POA: Diagnosis not present

## 2014-01-18 DIAGNOSIS — Z0000 Encounter for general adult medical examination without abnormal findings: Secondary | ICD-10-CM

## 2014-01-18 DIAGNOSIS — R3911 Hesitancy of micturition: Secondary | ICD-10-CM

## 2014-01-18 LAB — POCT UA - MICROSCOPIC ONLY
Casts, Ur, LPF, POC: NEGATIVE
Crystals, Ur, HPF, POC: NEGATIVE
Yeast, UA: NEGATIVE

## 2014-01-18 LAB — POCT URINALYSIS DIPSTICK
BILIRUBIN UA: NEGATIVE
Blood, UA: NEGATIVE
Glucose, UA: NEGATIVE
KETONES UA: NEGATIVE
NITRITE UA: NEGATIVE
Spec Grav, UA: 1.025
Urobilinogen, UA: NEGATIVE
pH, UA: 6

## 2014-01-18 NOTE — Progress Notes (Signed)
Patient ID: Anthony Kelly, male   DOB: 01-Oct-1945, 69 y.o.   MRN: [id number]  Subjective:    Anthony Kelly is a 69 y.o. male who presents for Medicare Initial Annual Wellness Visit   Preventive Screening-Counseling & Management  Tobacco History  Smoking status  . Former Smoker -- 1.50 packs/day for 25 years  . Quit date: 01/11/1990  Smokeless tobacco  . Never Used   Current Problems (verified) Patient Active Problem List   Diagnosis Date Noted  . Atrial fibrillation with RVR 11/27/2013  . Hyperlipidemia 11/27/2013  . Colitis 11/26/2013  . Nausea without vomiting 11/21/2013  . Heartburn 10/24/2013  . Constipation 10/24/2013  . Pericarditis 10/11/2013  . Diverticulitis 08/16/2013  . Pain in joint, pelvic region and thigh 08/01/2013  . PAF (paroxysmal atrial fibrillation) 05/10/2013  . History of PSVT (paroxysmal supraventricular tachycardia) 05/10/2013  . Family history of coronary artery disease 05/10/2013  . Coronary artery calcification seen on CAT scan 05/10/2013  . 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 aspirin EC 81 MG tablet Take 81 mg by mouth every morning.    Marland Kitchen atorvastatin (LIPITOR) 10 MG tablet Take 10 mg by mouth every evening.     . diltiazem (CARDIZEM CD) 120 MG 24 hr capsule Take 1 capsule (120 mg total) by mouth every morning. (Patient taking differently: Take 120 mg by mouth every evening. ) 90 capsule 3  . metoprolol succinate (TOPROL-XL) 25 MG 24 hr tablet TAKE 1 TABLET DAILY (Patient taking differently: TAKE 1 TABLET DAILY EVERY MOORNING) 90 tablet 3  . Multiple Vitamin (MULTIVITAMIN WITH MINERALS) TABS tablet Take 1 tablet by mouth every morning.     Marland Kitchen omeprazole (PRILOSEC) 20 MG capsule Take 1 capsule (20 mg total) by mouth daily. (Patient taking differently: Take 20 mg by mouth daily as needed (indegestion.). ) 90 capsule 3  . Polyvinyl  Alcohol-Povidone (REFRESH OP) Place 1 drop into both eyes 2 (two) times daily.    . Probiotic Product (PROBIOTIC DAILY PO) Take 1 capsule by mouth 3 (three) times daily.     Marland Kitchen acetaminophen (TYLENOL) 500 MG tablet Take 1,000 mg by mouth every 4 (four) hours as needed for moderate pain or headache.     No current facility-administered medications on file prior to visit.    Current Medications (verified) Current Outpatient Prescriptions  Medication Sig Dispense Refill  . aspirin EC 81 MG tablet Take 81 mg by mouth every morning.    Marland Kitchen atorvastatin (LIPITOR) 10 MG tablet Take 10 mg by mouth every evening.     . diltiazem (CARDIZEM CD) 120 MG 24 hr capsule Take 1 capsule (120 mg total) by mouth every morning. (Patient taking differently: Take 120 mg by mouth every evening. ) 90 capsule 3  . metoprolol succinate (TOPROL-XL) 25 MG 24 hr tablet TAKE 1 TABLET DAILY (Patient taking differently: TAKE 1 TABLET DAILY EVERY MOORNING) 90 tablet 3  . Multiple Vitamin (MULTIVITAMIN WITH MINERALS) TABS tablet Take 1 tablet by mouth every morning.     Marland Kitchen omeprazole (PRILOSEC) 20 MG capsule Take 1 capsule (20 mg total) by mouth daily. (Patient taking differently: Take 20 mg by mouth daily as needed (indegestion.). ) 90 capsule 3  . Polyvinyl Alcohol-Povidone (REFRESH OP) Place 1 drop into both eyes 2 (two) times daily.    . Probiotic Product (PROBIOTIC DAILY PO) Take 1 capsule by mouth 3 (three)  times daily.     Marland Kitchen acetaminophen (TYLENOL) 500 MG tablet Take 1,000 mg by mouth every 4 (four) hours as needed for moderate pain or headache.     No current facility-administered medications for this visit.     Allergies (verified) Review of patient's allergies indicates no known allergies.   PAST HISTORY  Family History Family History  Problem Relation Age of Onset  . Anesthesia problems Neg Hx   . Heart disease Father   . Heart attack Father     x 3  . Cancer Sister     breast ca  . Cancer Brother      prostate ca  . Heart attack Sister   . Heart disease Brother     Social History History  Substance Use Topics  . Smoking status: Former Smoker -- 1.50 packs/day for 25 years    Quit date: 01/11/1990  . Smokeless tobacco: Never Used  . Alcohol Use: No    Are there smokers in your home (other than you)?  No  Risk Factors Current exercise habits: Home exercise routine includes jogging and situps and push ups.  Dietary issues discussed: low fat   Cardiac risk factors: advanced age (older than 62 for men, 55 for women), dyslipidemia, family history of premature cardiovascular disease and male gender.  Depression Screen (Note: if answer to either of the following is "Yes", a more complete depression screening is indicated)   Q1: Over the past two weeks, have you felt down, depressed or hopeless? No  Q2: Over the past two weeks, have you felt little interest or pleasure in doing things? No  Have you lost interest or pleasure in daily life? No  Do you often feel hopeless? No  Do you cry easily over simple problems? No  Activities of Daily Living In your present state of health, do you have any difficulty performing the following activities?:  Driving? No Managing money?  No Feeding yourself? No Getting from bed to chair? No Climbing a flight of stairs? No Preparing food and eating?: No Bathing or showering? No Getting dressed: No Getting to the toilet? No Using the toilet:No Moving around from place to place: No In the past year have you fallen or had a near fall?:No   Are you sexually active?  No  Do you have more than one partner?  No  Hearing Difficulties: No Do you often ask people to speak up or repeat themselves? No Do you experience ringing or noises in your ears? No Do you have difficulty understanding soft or whispered voices? No   Do you feel that you have a problem with memory? No  Do you often misplace items? No  Do you feel safe at home?  Yes  Cognitive  Testing  Alert? Yes  Normal Appearance?Yes  Oriented to person? Yes  Place? Yes   Time? Yes  Recall of three objects?  Yes  Can perform simple calculations? Yes  Displays appropriate judgment?Yes  Can read the correct time from a watch face?Yes   Advanced Directives have been discussed with the patient? Yes    SPecialist: 1.  Cardiologist - Dr Bronson Ing 2.  GI - Dr Carlean Purl 3.  Oncologist - Dr Alvy Bimler 4.  Eye - Dr Darrick Meigs Estelle June  Immunization History  Administered Date(s) Administered  . Influenza-Unspecified 09/11/2012, 11/11/2013    Screening Tests Health Maintenance  Topic Date Due  . INFLUENZA VACCINE  11/23/2014 (Originally 08/12/2014)  . COLONOSCOPY  11/15/2019  . TETANUS/TDAP  08/10/2020  . PNEUMOCOCCAL POLYSACCHARIDE VACCINE AGE 67 AND OVER  Completed  . ZOSTAVAX  Completed   Prevnar 13 given at [facility] 03/07/2013 PET Scan was performed last month by oncologist.  Showed enlarged prostate but no cancer.  Patient has history of BPH and TUNA procedure by Dr Serita Butcher about 10 years ago. Patient reports nocturia about 3 times per per night, decreased urinary flow and incomplete emptying.  Denied pain or dysuria.  All answers were reviewed with the patient and necessary referrals were made:  Cherre Robins, [facility]   01/18/2014   History reviewed: allergies, current medications, past family history, past medical history, past social history, past surgical history and problem list   Objective:   Blood pressure 106/70, pulse 74, height 6' (1.829 m), weight 188 lb (85.276 kg). Body mass index is 25.49 kg/(m^2).   Assessment:     Initial Annual Wellness Visit Urinary hesitancy / nocturia - possible prostatis vs. BPH Hyperlipidemia   Plan:     During the course of the visit the patient was educated and counseled about appropriate screening and preventive services including:    Pneumococcal vaccine - UTD  Influenza vaccine - UTD  Td vaccine -  UTD  Zostavax / shingles vaccine - UTD  Prostate cancer screening - checking urinalysis today and PSA.  Urinalysis showed some bacteria and RBC.  DIscussed with Dr Laurance Flatten and recommended send urine for C & S.  Have patient set up appt for prostate exam.  Colorectal cancer screening - already scheduled for 01/30/2014  Diabetes screening - UTD  Glaucoma screening - UTD  Advanced directives: has an advanced directive - a copy has been provided  Orders Placed This Encounter  Procedures  . Urine culture  . Lipid panel  . CMP14+EGFR  . PSA, total and free  . POCT UA - Microscopic Only  . POCT urinalysis dipstick     Diet review for nutrition referral? Yes ____  Not Indicated __X__   Patient Instructions (the written plan) was given to the patient.  Medicare Attestation I have personally reviewed: The patient's medical and social history Their use of alcohol, tobacco or illicit drugs Their current medications and supplements The patient's functional ability including ADLs,fall risks, home safety risks, cognitive, and hearing and visual impairment Diet and physical activities Evidence for depression or mood disorders  The patient's weight, height, BMI, and BP/HR have been recorded in the chart.  I have made referrals, counseling, and provided education to the patient based on review of the above and I have provided the patient with a written personalized care plan for preventive services.     Cherre Robins, [facility]   01/18/2014

## 2014-01-18 NOTE — Patient Instructions (Signed)
Preventive Care for Adults A healthy lifestyle and preventive care can promote health and wellness. Preventive health guidelines for men include the following key practices:  A routine yearly physical is a good way to check with your health care provider about your health and preventative screening. It is a chance to share any concerns and updates on your health and to receive a thorough exam.  Visit your dentist for a routine exam and preventative care every 6 months. Brush your teeth twice a day and floss once a day. Good oral hygiene prevents tooth decay and gum disease.  The frequency of eye exams is based on your age, health, family medical history, use of contact lenses, and other factors. Follow your health care provider's recommendations for frequency of eye exams.  Eat a healthy diet. Foods such as vegetables, fruits, whole grains, low-fat dairy products, and lean protein foods contain the nutrients you need without too many calories. Decrease your intake of foods high in solid fats, added sugars, and salt. Eat the right amount of calories for you.Get information about a proper diet from your health care provider, if necessary.  Regular physical exercise is one of the most important things you can do for your health. Most adults should get at least 150 minutes of moderate-intensity exercise (any activity that increases your heart rate and causes you to sweat) each week. In addition, most adults need muscle-strengthening exercises on 2 or more days a week.  Maintain a healthy weight. The body mass index (BMI) is a screening tool to identify possible weight problems. It provides an estimate of body fat based on height and weight. Your health care provider can find your BMI and can help you achieve or maintain a healthy weight.For adults 20 years and older:  A BMI below 18.5 is considered underweight.  A BMI of 18.5 to 24.9 is normal.  A BMI of 25 to 29.9 is considered overweight.  A BMI  of 30 and above is considered obese.  Maintain normal blood lipids and cholesterol levels by exercising and minimizing your intake of saturated fat. Eat a balanced diet with plenty of fruit and vegetables. Blood tests for lipids and cholesterol should begin at age 50 and be repeated every 5 years. If your lipid or cholesterol levels are high, you are over 50, or you are at high risk for heart disease, you may need your cholesterol levels checked more frequently.Ongoing high lipid and cholesterol levels should be treated with medicines if diet and exercise are not working.  If you smoke, find out from your health care provider how to quit. If you do not use tobacco, do not start.  Lung cancer screening is recommended for adults aged 73-80 years who are at high risk for developing lung cancer because of a history of smoking. A yearly low-dose CT scan of the lungs is recommended for people who have at least a 30-pack-year history of smoking and are a current smoker or have quit within the past 15 years. A pack year of smoking is smoking an average of 1 pack of cigarettes a day for 1 year (for example: 1 pack a day for 30 years or 2 packs a day for 15 years). Yearly screening should continue until the smoker has stopped smoking for at least 15 years. Yearly screening should be stopped for people who develop a health problem that would prevent them from having lung cancer treatment.  If you choose to drink alcohol, do not have more than  2 drinks per day. One drink is considered to be 12 ounces (355 mL) of beer, 5 ounces (148 mL) of wine, or 1.5 ounces (44 mL) of liquor.  Avoid use of street drugs. Do not share needles with anyone. Ask for help if you need support or instructions about stopping the use of drugs.  High blood pressure causes heart disease and increases the risk of stroke. Your blood pressure should be checked at least every 1-2 years. Ongoing high blood pressure should be treated with  medicines, if weight loss and exercise are not effective.  If you are 45-79 years old, ask your health care provider if you should take aspirin to prevent heart disease.  Diabetes screening involves taking a blood sample to check your fasting blood sugar level. This should be done once every 3 years, after age 45, if you are within normal weight and without risk factors for diabetes. Testing should be considered at a younger age or be carried out more frequently if you are overweight and have at least 1 risk factor for diabetes.  Colorectal cancer can be detected and often prevented. Most routine colorectal cancer screening begins at the age of 50 and continues through age 75. However, your health care provider may recommend screening at an earlier age if you have risk factors for colon cancer. On a yearly basis, your health care provider may provide home test kits to check for hidden blood in the stool. Use of a small camera at the end of a tube to directly examine the colon (sigmoidoscopy or colonoscopy) can detect the earliest forms of colorectal cancer. Talk to your health care provider about this at age 50, when routine screening begins. Direct exam of the colon should be repeated every 5-10 years through age 75, unless early forms of precancerous polyps or small growths are found.  People who are at an increased risk for hepatitis B should be screened for this virus. You are considered at high risk for hepatitis B if:  You were born in a country where hepatitis B occurs often. Talk with your health care provider about which countries are considered high risk.  Your parents were born in a high-risk country and you have not received a shot to protect against hepatitis B (hepatitis B vaccine).  You have HIV or AIDS.  You use needles to inject street drugs.  You live with, or have sex with, someone who has hepatitis B.  You are a man who has sex with other men (MSM).  You get hemodialysis  treatment.  You take certain medicines for conditions such as cancer, organ transplantation, and autoimmune conditions.  Hepatitis C blood testing is recommended for all people born from 1945 through 1965 and any individual with known risks for hepatitis C.  Practice safe sex. Use condoms and avoid high-risk sexual practices to reduce the spread of sexually transmitted infections (STIs). STIs include gonorrhea, chlamydia, syphilis, trichomonas, herpes, HPV, and human immunodeficiency virus (HIV). Herpes, HIV, and HPV are viral illnesses that have no cure. They can result in disability, cancer, and death.  If you are at risk of being infected with HIV, it is recommended that you take a prescription medicine daily to prevent HIV infection. This is called preexposure prophylaxis (PrEP). You are considered at risk if:  You are a man who has sex with other men (MSM) and have other risk factors.  You are a heterosexual man, are sexually active, and are at increased risk for HIV infection.    You take drugs by injection.  You are sexually active with a partner who has HIV.  Talk with your health care provider about whether you are at high risk of being infected with HIV. If you choose to begin PrEP, you should first be tested for HIV. You should then be tested every 3 months for as long as you are taking PrEP.  A one-time screening for abdominal aortic aneurysm (AAA) and surgical repair of large AAAs by ultrasound are recommended for men ages 32 to 67 years who are current or former smokers.  Healthy men should no longer receive prostate-specific antigen (PSA) blood tests as part of routine cancer screening. Talk with your health care provider about prostate cancer screening.  Testicular cancer screening is not recommended for adult males who have no symptoms. Screening includes self-exam, a health care provider exam, and other screening tests. Consult with your health care provider about any symptoms  you have or any concerns you have about testicular cancer.  Use sunscreen. Apply sunscreen liberally and repeatedly throughout the day. You should seek shade when your shadow is shorter than you. Protect yourself by wearing long sleeves, pants, a wide-brimmed hat, and sunglasses year round, whenever you are outdoors.  Once a month, do a whole-body skin exam, using a mirror to look at the skin on your back. Tell your health care provider about new moles, moles that have irregular borders, moles that are larger than a pencil eraser, or moles that have changed in shape or color.  Stay current with required vaccines (immunizations).  Influenza vaccine. All adults should be immunized every year.  Tetanus, diphtheria, and acellular pertussis (Td, Tdap) vaccine. An adult who has not previously received Tdap or who does not know his vaccine status should receive 1 dose of Tdap. This initial dose should be followed by tetanus and diphtheria toxoids (Td) booster doses every 10 years. Adults with an unknown or incomplete history of completing a 3-dose immunization series with Td-containing vaccines should begin or complete a primary immunization series including a Tdap dose. Adults should receive a Td booster every 10 years.  Varicella vaccine. An adult without evidence of immunity to varicella should receive 2 doses or a second dose if he has previously received 1 dose.  Human papillomavirus (HPV) vaccine. Males aged 68-21 years who have not received the vaccine previously should receive the 3-dose series. Males aged 22-26 years may be immunized. Immunization is recommended through the age of 6 years for any male who has sex with males and did not get any or all doses earlier. Immunization is recommended for any person with an immunocompromised condition through the age of 49 years if he did not get any or all doses earlier. During the 3-dose series, the second dose should be obtained 4-8 weeks after the first  dose. The third dose should be obtained 24 weeks after the first dose and 16 weeks after the second dose.  Zoster vaccine. One dose is recommended for adults aged 50 years or older unless certain conditions are present.  Measles, mumps, and rubella (MMR) vaccine. Adults born before [birth year] generally are considered immune to measles and mumps. Adults born in [birth year] or later should have 1 or more doses of MMR vaccine unless there is a contraindication to the vaccine or there is laboratory evidence of immunity to each of the three diseases. A routine second dose of MMR vaccine should be obtained at least 28 days after the first dose for students attending postsecondary  schools, health care workers, or international travelers. People who received inactivated measles vaccine or an unknown type of measles vaccine during 1963-1967 should receive 2 doses of MMR vaccine. People who received inactivated mumps vaccine or an unknown type of mumps vaccine before 1979 and are at high risk for mumps infection should consider immunization with 2 doses of MMR vaccine. Unvaccinated health care workers born before 1957 who lack laboratory evidence of measles, mumps, or rubella immunity or laboratory confirmation of disease should consider measles and mumps immunization with 2 doses of MMR vaccine or rubella immunization with 1 dose of MMR vaccine.  Pneumococcal 13-valent conjugate (PCV13) vaccine. When indicated, a person who is uncertain of his immunization history and has no record of immunization should receive the PCV13 vaccine. An adult aged 19 years or older who has certain medical conditions and has not been previously immunized should receive 1 dose of PCV13 vaccine. This PCV13 should be followed with a dose of pneumococcal polysaccharide (PPSV23) vaccine. The PPSV23 vaccine dose should be obtained at least 8 weeks after the dose of PCV13 vaccine. An adult aged 19 years or older who has certain medical conditions and  previously received 1 or more doses of PPSV23 vaccine should receive 1 dose of PCV13. The PCV13 vaccine dose should be obtained 1 or more years after the last PPSV23 vaccine dose.  Pneumococcal polysaccharide (PPSV23) vaccine. When PCV13 is also indicated, PCV13 should be obtained first. All adults aged 65 years and older should be immunized. An adult younger than age 65 years who has certain medical conditions should be immunized. Any person who resides in a nursing home or long-term care facility should be immunized. An adult smoker should be immunized. People with an immunocompromised condition and certain other conditions should receive both PCV13 and PPSV23 vaccines. People with human immunodeficiency virus (HIV) infection should be immunized as soon as possible after diagnosis. Immunization during chemotherapy or radiation therapy should be avoided. Routine use of PPSV23 vaccine is not recommended for American Indians, [place] Natives, or people younger than 65 years unless there are medical conditions that require PPSV23 vaccine. When indicated, people who have unknown immunization and have no record of immunization should receive PPSV23 vaccine. One-time revaccination 5 years after the first dose of PPSV23 is recommended for people aged 19-64 years who have chronic kidney failure, nephrotic syndrome, asplenia, or immunocompromised conditions. People who received 1-2 doses of PPSV23 before age 65 years should receive another dose of PPSV23 vaccine at age 65 years or later if at least 5 years have passed since the previous dose. Doses of PPSV23 are not needed for people immunized with PPSV23 at or after age 65 years.  Meningococcal vaccine. Adults with asplenia or persistent complement component deficiencies should receive 2 doses of quadrivalent meningococcal conjugate (MenACWY-D) vaccine. The doses should be obtained at least 2 months apart. Microbiologists working with certain meningococcal bacteria,  military recruits, people at risk during an outbreak, and people who travel to or live in countries with a high rate of meningitis should be immunized. A first-year college student up through age 21 years who is living in a residence hall should receive a dose if he did not receive a dose on or after his 16th birthday. Adults who have certain high-risk conditions should receive one or more doses of vaccine.  Hepatitis A vaccine. Adults who wish to be protected from this disease, have certain high-risk conditions, work with hepatitis A-infected animals, work in hepatitis A research labs, or   travel to or work in countries with a high rate of hepatitis A should be immunized. Adults who were previously unvaccinated and who anticipate close contact with an international adoptee during the first 60 days after arrival in the [place] from a country with a high rate of hepatitis A should be immunized.  Hepatitis B vaccine. Adults should be immunized if they wish to be protected from this disease, have certain high-risk conditions, may be exposed to blood or other infectious body fluids, are household contacts or sex partners of hepatitis B positive people, are clients or workers in certain care facilities, or travel to or work in countries with a high rate of hepatitis B.  Haemophilus influenzae type b (Hib) vaccine. A previously unvaccinated person with asplenia or sickle cell disease or having a scheduled splenectomy should receive 1 dose of Hib vaccine. Regardless of previous immunization, a recipient of a hematopoietic stem cell transplant should receive a 3-dose series 6-12 months after his successful transplant. Hib vaccine is not recommended for adults with HIV infection. Preventive Service / Frequency Ages 38 to 36  Blood pressure check.** / Every 1 to 2 years.  Lipid and cholesterol check.** / Every 5 years beginning at age 25.  Lung cancer screening. / Every year if you are aged 58-80 years and  have a 30-pack-year history of smoking and currently smoke or have quit within the past 15 years. Yearly screening is stopped once you have quit smoking for at least 15 years or develop a health problem that would prevent you from having lung cancer treatment.  Fecal occult blood test (FOBT) of stool. / Every year beginning at age 59 and continuing until age 59. You may not have to do this test if you get a colonoscopy every 10 years.  Flexible sigmoidoscopy** or colonoscopy.** / Every 5 years for a flexible sigmoidoscopy or every 10 years for a colonoscopy beginning at age 61 and continuing until age 61.  Hepatitis C blood test.** / For all people born from [birth year] through 1965 and any individual with known risks for hepatitis C.  Skin self-exam. / Monthly.  Influenza vaccine. / Every year.  Tetanus, diphtheria, and acellular pertussis (Tdap/Td) vaccine.** / Consult your health care provider. 1 dose of Td every 10 years.  Varicella vaccine.** / Consult your health care provider.  Zoster vaccine.** / 1 dose for adults aged 76 years or older.  Measles, mumps, rubella (MMR) vaccine.** / You need at least 1 dose of MMR if you were born in 1957 or later. You may also need a second dose.  Pneumococcal 13-valent conjugate (PCV13) vaccine.** / Consult your health care provider.  Pneumococcal polysaccharide (PPSV23) vaccine.** / 1 to 2 doses if you smoke cigarettes or if you have certain conditions.  Meningococcal vaccine.** / Consult your health care provider.  Hepatitis A vaccine.** / Consult your health care provider.  Hepatitis B vaccine.** / Consult your health care provider.  Haemophilus influenzae type b (Hib) vaccine.** / Consult your health care provider. Ages 55 and over  Blood pressure check.** / Every 1 to 2 years.  Lipid and cholesterol check.**/ Every 5 years beginning at age 60.  Lung cancer screening. / Every year if you are aged 21-80 years and have a 30-pack-year history  of smoking and currently smoke or have quit within the past 15 years. Yearly screening is stopped once you have quit smoking for at least 15 years or develop a health problem that would prevent you from having  lung cancer treatment.  Fecal occult blood test (FOBT) of stool. / Every year beginning at age 41 and continuing until age 72. You may not have to do this test if you get a colonoscopy every 10 years.  Flexible sigmoidoscopy** or colonoscopy.** / Every 5 years for a flexible sigmoidoscopy or every 10 years for a colonoscopy beginning at age 44 and continuing until age 26.  Hepatitis C blood test.** / For all people born from [birth year] through 1965 and any individual with known risks for hepatitis C.  Abdominal aortic aneurysm (AAA) screening.** / A one-time screening for ages 41 to 85 years who are current or former smokers.  Skin self-exam. / Monthly.  Influenza vaccine. / Every year.  Tetanus, diphtheria, and acellular pertussis (Tdap/Td) vaccine.** / 1 dose of Td every 10 years.  Varicella vaccine.** / Consult your health care provider.  Zoster vaccine.** / 1 dose for adults aged 33 years or older.  Pneumococcal 13-valent conjugate (PCV13) vaccine.** / Consult your health care provider.  Pneumococcal polysaccharide (PPSV23) vaccine.** / 1 dose for all adults aged 79 years and older.  Meningococcal vaccine.** / Consult your health care provider.  Hepatitis A vaccine.** / Consult your health care provider.  Hepatitis B vaccine.** / Consult your health care provider.  Haemophilus influenzae type b (Hib) vaccine.** / Consult your health care provider. **Family history and personal history of risk and conditions may change your health care provider's recommendations. Document Released: 02/23/2001 Document Revised: 01/02/2013 Document Reviewed: 05/25/2010 [facility] Patient Information 2015 [place], [place]. This information is not intended to replace advice given to you by your health  care provider. Make sure you discuss any questions you have with your health care provider.

## 2014-01-19 LAB — CMP14+EGFR
ALBUMIN: 4.3 g/dL (ref 3.6–4.8)
ALT: 21 IU/L (ref 0–44)
AST: 24 IU/L (ref 0–40)
Albumin/Globulin Ratio: 2.3 (ref 1.1–2.5)
Alkaline Phosphatase: 59 IU/L (ref 39–117)
BUN/Creatinine Ratio: 11 (ref 10–22)
BUN: 11 mg/dL (ref 8–27)
CHLORIDE: 103 mmol/L (ref 97–108)
CO2: 25 mmol/L (ref 18–29)
Calcium: 9.3 mg/dL (ref 8.6–10.2)
Creatinine, Ser: 0.97 mg/dL (ref 0.76–1.27)
GFR calc non Af Amer: 79 mL/min/{1.73_m2} (ref >59)
GFR, EST AFRICAN AMERICAN: 92 mL/min/{1.73_m2} (ref >59)
Globulin, Total: 1.9 g/dL (ref 1.5–4.5)
Glucose: 96 mg/dL (ref 65–99)
Potassium: 4.5 mmol/L (ref 3.5–5.2)
Sodium: 143 mmol/L (ref 134–144)
TOTAL PROTEIN: 6.2 g/dL (ref 6.0–8.5)
Total Bilirubin: 0.6 mg/dL (ref 0.0–1.2)

## 2014-01-19 LAB — LIPID PANEL
CHOL/HDL RATIO: 3.5 ratio (ref 0.0–5.0)
Cholesterol, Total: 134 mg/dL (ref 100–199)
HDL: 38 mg/dL — ABNORMAL LOW (ref >39)
LDL Calculated: 57 mg/dL (ref 0–99)
Triglycerides: 195 mg/dL — ABNORMAL HIGH (ref 0–149)
VLDL Cholesterol Cal: 39 mg/dL (ref 5–40)

## 2014-01-19 LAB — PSA, TOTAL AND FREE
PSA, Free Pct: 68.3 %
PSA, Free: 0.41 ng/mL
PSA: 0.6 ng/mL (ref 0.0–4.0)

## 2014-01-20 LAB — URINE CULTURE

## 2014-01-21 ENCOUNTER — Telehealth: Payer: Self-pay | Admitting: Pharmacist

## 2014-01-21 ENCOUNTER — Encounter: Payer: Self-pay | Admitting: Pharmacist

## 2014-01-21 MED ORDER — SULFAMETHOXAZOLE-TRIMETHOPRIM 800-160 MG PO TABS: 1.0000 | ORAL_TABLET | Freq: Two times a day (BID) | ORAL | Status: DC

## 2014-01-21 NOTE — Telephone Encounter (Signed)
urine culture showed e coli infection.  Recommend start Bactrim DS 1 tablet BID for 7 days.  If symptoms do not improve or resolve then call office to further evaluation.  Also notified patient's wife of other labs - lipids good except Tg elevated - discussed limiting sugar, processed foods and bread.  CMP and PSA were WNL.

## 2014-01-23 ENCOUNTER — Other Ambulatory Visit: Payer: Self-pay | Admitting: Pharmacist

## 2014-01-28 ENCOUNTER — Telehealth: Payer: Self-pay | Admitting: Internal Medicine

## 2014-01-28 MED ORDER — CIPROFLOXACIN HCL 500 MG PO TABS: 500.0000 mg | ORAL_TABLET | Freq: Two times a day (BID) | ORAL | Status: DC

## 2014-01-28 MED ORDER — METRONIDAZOLE 500 MG PO TABS: 500.0000 mg | ORAL_TABLET | Freq: Two times a day (BID) | ORAL | Status: DC

## 2014-01-28 NOTE — Telephone Encounter (Signed)
Patient was recently seen for diverticulitis and is scheduled for a colonoscopy for Wed with Dr. Carlean Purl.  Patient recently completed the last course of antibiotics that was started on 01/06/15 for 11 days (should have completed on 01/15/14).  Wife reports that after eating eggs yesterday patient developed LLQ pain that "doubles him over".  Wife reports that he is c/o LLQ pain denies fever or other complaints.  He initially improved after antibiotics and "everything was better until he started exercising again".  He has been doing abdominal exercises.  Discussed with Nicoletta Ba PA new orders:  Reschedule colon for 3 week or later.  Rescheduled for 02/22/14 3:30 cipro 500 mg BID for 14 days Flagyl 500 mg BID for 14 days Continue probiotics Clear liquids until pain improves then advance diet Call if symptoms have not improved by the end of the week.  Discussed all the above with his wife and she verbalized understanding.

## 2014-01-30 ENCOUNTER — Encounter: Payer: Medicare Other | Admitting: Internal Medicine

## 2014-02-05 ENCOUNTER — Telehealth: Payer: Self-pay | Admitting: Internal Medicine

## 2014-02-05 NOTE — Telephone Encounter (Signed)
FYI

## 2014-02-07 ENCOUNTER — Other Ambulatory Visit: Payer: Self-pay | Admitting: *Deleted

## 2014-02-07 DIAGNOSIS — R3911 Hesitancy of micturition: Secondary | ICD-10-CM

## 2014-02-08 ENCOUNTER — Other Ambulatory Visit: Payer: Medicare Other

## 2014-02-08 DIAGNOSIS — R3911 Hesitancy of micturition: Secondary | ICD-10-CM

## 2014-02-08 NOTE — Progress Notes (Signed)
Lab only 

## 2014-02-10 LAB — URINE CULTURE

## 2014-02-11 ENCOUNTER — Encounter (INDEPENDENT_AMBULATORY_CARE_PROVIDER_SITE_OTHER): Payer: Medicare Other | Admitting: Ophthalmology

## 2014-02-11 DIAGNOSIS — H2513 Age-related nuclear cataract, bilateral: Secondary | ICD-10-CM

## 2014-02-11 DIAGNOSIS — H43813 Vitreous degeneration, bilateral: Secondary | ICD-10-CM

## 2014-02-11 DIAGNOSIS — H33301 Unspecified retinal break, right eye: Secondary | ICD-10-CM | POA: Diagnosis not present

## 2014-02-11 DIAGNOSIS — H4311 Vitreous hemorrhage, right eye: Secondary | ICD-10-CM | POA: Diagnosis not present

## 2014-02-11 DIAGNOSIS — H33321 Round hole, right eye: Secondary | ICD-10-CM | POA: Diagnosis not present

## 2014-02-13 ENCOUNTER — Telehealth: Payer: Self-pay | Admitting: Pharmacist

## 2014-02-13 NOTE — Telephone Encounter (Signed)
Results discussed with Dr Laurance Flatten. Patient is not having symptoms of UTI. Denies dysuria, urinary frequency and hesitancy. In the last 6 weeks he has taken ABX for GI twice and for UTI once.   No treatment at this time - patient will call if he experiences any pain or discomfort.  Repeat urine culture in 2 weeks.  Patient aware.

## 2014-02-21 DIAGNOSIS — B372 Candidiasis of skin and nail: Secondary | ICD-10-CM | POA: Diagnosis not present

## 2014-02-21 DIAGNOSIS — L570 Actinic keratosis: Secondary | ICD-10-CM | POA: Diagnosis not present

## 2014-02-22 ENCOUNTER — Encounter: Payer: Self-pay | Admitting: Internal Medicine

## 2014-02-22 ENCOUNTER — Ambulatory Visit (AMBULATORY_SURGERY_CENTER): Payer: Medicare Other | Admitting: Internal Medicine

## 2014-02-22 VITALS — BP 107/62 | HR 64 | Temp 95.2°F | Resp 23 | Ht 72.0 in | Wt 189.0 lb

## 2014-02-22 DIAGNOSIS — K5730 Diverticulosis of large intestine without perforation or abscess without bleeding: Secondary | ICD-10-CM

## 2014-02-22 DIAGNOSIS — R933 Abnormal findings on diagnostic imaging of other parts of digestive tract: Secondary | ICD-10-CM

## 2014-02-22 DIAGNOSIS — D125 Benign neoplasm of sigmoid colon: Secondary | ICD-10-CM

## 2014-02-22 DIAGNOSIS — D175 Benign lipomatous neoplasm of intra-abdominal organs: Secondary | ICD-10-CM | POA: Diagnosis not present

## 2014-02-22 DIAGNOSIS — D1779 Benign lipomatous neoplasm of other sites: Secondary | ICD-10-CM

## 2014-02-22 DIAGNOSIS — I4891 Unspecified atrial fibrillation: Secondary | ICD-10-CM | POA: Diagnosis not present

## 2014-02-22 DIAGNOSIS — K5732 Diverticulitis of large intestine without perforation or abscess without bleeding: Secondary | ICD-10-CM | POA: Diagnosis not present

## 2014-02-22 DIAGNOSIS — I471 Supraventricular tachycardia: Secondary | ICD-10-CM | POA: Diagnosis not present

## 2014-02-22 DIAGNOSIS — Z1211 Encounter for screening for malignant neoplasm of colon: Secondary | ICD-10-CM | POA: Diagnosis not present

## 2014-02-22 MED ORDER — METRONIDAZOLE 500 MG PO TABS
500.0000 mg | ORAL_TABLET | Freq: Two times a day (BID) | ORAL | Status: DC
Start: 2014-02-22 — End: 2014-03-20

## 2014-02-22 MED ORDER — CIPROFLOXACIN HCL 500 MG PO TABS: 500.0000 mg | ORAL_TABLET | Freq: Two times a day (BID) | ORAL | Status: DC

## 2014-02-22 MED ORDER — SODIUM CHLORIDE 0.9 % IV SOLN: 500.0000 mL | INTRAVENOUS | Status: DC

## 2014-02-22 NOTE — Progress Notes (Signed)
Procedure ends, to recovery, report given and VSS. 

## 2014-02-22 NOTE — Progress Notes (Signed)
Called to room to assist during endoscopic procedure.  Patient ID and intended procedure confirmed with present staff. Received instructions for my participation in the procedure from the performing physician.  

## 2014-02-22 NOTE — Patient Instructions (Addendum)
I found and removed one polyp that looks benign. You do have severe diverticulosis but no diverticulitis.  I will let you know pathology results and when to have another routine colonoscopy by mail.  I have prescribed antibiotics for you to have in case you get another attack of diverticulitis - call me and let me know if you get this again.  I appreciate the opportunity to care for you. [name], MD, FACG  YOU HAD AN ENDOSCOPIC PROCEDURE TODAY AT [facility]: Refer to the procedure report that was given to you for any specific questions about what was found during the examination.  If the procedure report does not answer your questions, please call your gastroenterologist to clarify.  If you requested that your care partner not be given the details of your procedure findings, then the procedure report has been included in a sealed envelope for you to review at your convenience later.  YOU SHOULD EXPECT: Some feelings of bloating in the abdomen. Passage of more gas than usual.  Walking can help get rid of the air that was put into your GI tract during the procedure and reduce the bloating. If you had a lower endoscopy (such as a colonoscopy or flexible sigmoidoscopy) you may notice spotting of blood in your stool or on the toilet paper. If you underwent a bowel prep for your procedure, then you may not have a normal bowel movement for a few days.  DIET: Your first meal following the procedure should be a light meal and then it is ok to progress to your normal diet.  A half-sandwich or bowl of soup is an example of a good first meal.  Heavy or fried foods are harder to digest and may make you feel nauseous or bloated.  Likewise meals heavy in dairy and vegetables can cause extra gas to form and this can also increase the bloating.  Drink plenty of fluids but you should avoid alcoholic beverages for 24 hours.  ACTIVITY: Your care partner should take you home directly  after the procedure.  You should plan to take it easy, moving slowly for the rest of the day.  You can resume normal activity the day after the procedure however you should NOT DRIVE or use heavy machinery for 24 hours (because of the sedation medicines used during the test).    SYMPTOMS TO REPORT IMMEDIATELY: A gastroenterologist can be reached at any hour.  During normal business hours, 8:30 AM to 5:00 PM Monday through Friday, call [phone].  After hours and on weekends, please call the GI answering service at [phone] who will take a message and have the physician on call contact you.   Following lower endoscopy (colonoscopy or flexible sigmoidoscopy):  Excessive amounts of blood in the stool  Significant tenderness or worsening of abdominal pains  Swelling of the abdomen that is new, acute  Fever of 100F or higher  FOLLOW UP: If any biopsies were taken you will be contacted by phone or by letter within the next 1-3 weeks.  Call your gastroenterologist if you have not heard about the biopsies in 3 weeks.  Our staff will call the home number listed on your records the next business day following your procedure to check on you and address any questions or concerns that you may have at that time regarding the information given to you following your procedure. This is a courtesy call and so if there is no answer at the  home number and we have not heard from you through the emergency physician on call, we will assume that you have returned to your regular daily activities without incident.  SIGNATURES/CONFIDENTIALITY: You and/or your care partner have signed paperwork which will be entered into your electronic medical record.  These signatures attest to the fact that that the information above on your After Visit Summary has been reviewed and is understood.  Full responsibility of the confidentiality of this discharge information lies with you and/or your care-partner.    Read the  handouts given to you by your recovery room nurse.

## 2014-02-22 NOTE — Op Note (Signed)
[place]  [address] [place], [address]   COLONOSCOPY PROCEDURE REPORT  PATIENT: Anthony Kelly, Anthony Kelly  MR#: [id number] BIRTHDATE: 1945-09-24 , 44  yrs. old GENDER: male ENDOSCOPIST: [name], MD, [facility] PROCEDURE DATE:  02/22/2014 PROCEDURE:   Colonoscopy with snare polypectomy First Screening Colonoscopy - Avg.  risk and is 50 yrs.  old or older - No.  Prior Negative Screening - Now for repeat screening. N/A  History of Adenoma - Now for follow-up colonoscopy & has been > or = to 3 yrs.  N/A  Polyps Removed Today? Yes. ASA CLASS:   Class III INDICATIONS:abnormal colon and rectum on CT. MEDICATIONS: Propofol 250 mg IV and Monitored anesthesia care  DESCRIPTION OF PROCEDURE:   After the risks benefits and alternatives of the procedure were thoroughly explained, informed consent was obtained.  The digital rectal exam revealed no abnormalities of the rectum, revealed no prostatic nodules, and revealed the prostate was not enlarged.   The LB [id number] endoscope was introduced through the anus and advanced to the cecum, which was identified by both the appendix and ileocecal valve. No adverse events experienced.   The quality of the prep was good, using MiraLax  The instrument was then slowly withdrawn as the colon was fully examined.      COLON FINDINGS: 1) 8-10 mm semi-pedunculated distal sigmoid polyp removed with snare cautery and sent to pathology 2) severe sigmoid diverticulosis 3) Otherwise normal colonoscopy, good prep. Retroflexed views revealed no abnormalities. The time to cecum=4 minutes 08 seconds.  Withdrawal time=16 minutes 12 seconds.  The scope was withdrawn and the procedure completed. COMPLICATIONS: There were no immediate complications.  ENDOSCOPIC IMPRESSION: 1) 8-10 mm semi-pedunculated distal sigmoid polyp removed with snare cautery and sent to pathology 2) severe sigmoid diverticulosis 3) Otherwise normal colonoscopy, good  prep  RECOMMENDATIONS: 1.  Hold Aspirin and all other NSAIDS for 2 weeks. 2.  Timing of repeat colonoscopy will be determined by pathology findings. 3.  Cipro and Flagyl Rx to have on hand if recurrent diverticulitis eSigned:  [name], MD, [facility] 02/22/2014 4:23 PM   cc: [name], MD and The Patient

## 2014-02-25 ENCOUNTER — Telehealth: Payer: Self-pay | Admitting: *Deleted

## 2014-02-25 ENCOUNTER — Ambulatory Visit (INDEPENDENT_AMBULATORY_CARE_PROVIDER_SITE_OTHER): Payer: Medicare Other | Admitting: Ophthalmology

## 2014-02-25 NOTE — Telephone Encounter (Signed)
  Follow up Call-  Call back number 02/22/2014  Post procedure Call Back phone  # [phone]  Permission to leave phone message Yes     No answer at # given.  Left message on VM.

## 2014-02-26 ENCOUNTER — Other Ambulatory Visit: Payer: Self-pay | Admitting: Family Medicine

## 2014-02-28 ENCOUNTER — Encounter: Payer: Self-pay | Admitting: Internal Medicine

## 2014-02-28 ENCOUNTER — Ambulatory Visit (INDEPENDENT_AMBULATORY_CARE_PROVIDER_SITE_OTHER): Payer: Medicare Other | Admitting: Ophthalmology

## 2014-02-28 ENCOUNTER — Other Ambulatory Visit: Payer: Medicare Other

## 2014-02-28 DIAGNOSIS — N390 Urinary tract infection, site not specified: Secondary | ICD-10-CM | POA: Diagnosis not present

## 2014-02-28 DIAGNOSIS — H33301 Unspecified retinal break, right eye: Secondary | ICD-10-CM

## 2014-02-28 NOTE — Progress Notes (Signed)
Quick Note:  Lipoma Repeat colonoscopy 2026 ______

## 2014-02-28 NOTE — Progress Notes (Signed)
Lab only 

## 2014-03-02 LAB — URINE CULTURE

## 2014-03-04 ENCOUNTER — Encounter: Payer: Self-pay | Admitting: Family Medicine

## 2014-03-04 ENCOUNTER — Other Ambulatory Visit: Payer: Self-pay | Admitting: *Deleted

## 2014-03-04 MED ORDER — DOXYCYCLINE HYCLATE 100 MG PO TABS: 100.0000 mg | ORAL_TABLET | Freq: Two times a day (BID) | ORAL | Status: DC

## 2014-03-04 NOTE — Progress Notes (Signed)
Pt notified of urine culture results and Rx sent to [facility] as requested

## 2014-03-15 ENCOUNTER — Other Ambulatory Visit (INDEPENDENT_AMBULATORY_CARE_PROVIDER_SITE_OTHER): Payer: Medicare Other

## 2014-03-15 DIAGNOSIS — N390 Urinary tract infection, site not specified: Secondary | ICD-10-CM | POA: Diagnosis not present

## 2014-03-15 LAB — POCT URINALYSIS DIPSTICK
BILIRUBIN UA: NEGATIVE
Blood, UA: NEGATIVE
Glucose, UA: NEGATIVE
KETONES UA: NEGATIVE
Leukocytes, UA: NEGATIVE
Nitrite, UA: NEGATIVE
PH UA: 5
Protein, UA: NEGATIVE
Spec Grav, UA: 1.01
Urobilinogen, UA: NEGATIVE

## 2014-03-15 LAB — POCT UA - MICROSCOPIC ONLY
Bacteria, U Microscopic: NEGATIVE
CRYSTALS, UR, HPF, POC: NEGATIVE
Casts, Ur, LPF, POC: NEGATIVE
MUCUS UA: NEGATIVE
RBC, urine, microscopic: NEGATIVE
WBC, Ur, HPF, POC: NEGATIVE
Yeast, UA: NEGATIVE

## 2014-03-15 NOTE — Progress Notes (Signed)
Lab only 

## 2014-03-18 ENCOUNTER — Encounter: Payer: Self-pay | Admitting: Family Medicine

## 2014-03-20 ENCOUNTER — Ambulatory Visit (INDEPENDENT_AMBULATORY_CARE_PROVIDER_SITE_OTHER): Payer: Medicare Other | Admitting: Cardiovascular Disease

## 2014-03-20 ENCOUNTER — Encounter: Payer: Self-pay | Admitting: Cardiovascular Disease

## 2014-03-20 VITALS — BP 98/63 | HR 73 | Ht 72.0 in | Wt 196.0 lb

## 2014-03-20 DIAGNOSIS — I319 Disease of pericardium, unspecified: Secondary | ICD-10-CM | POA: Diagnosis not present

## 2014-03-20 DIAGNOSIS — I519 Heart disease, unspecified: Secondary | ICD-10-CM

## 2014-03-20 DIAGNOSIS — I2510 Atherosclerotic heart disease of native coronary artery without angina pectoris: Secondary | ICD-10-CM

## 2014-03-20 DIAGNOSIS — I480 Paroxysmal atrial fibrillation: Secondary | ICD-10-CM

## 2014-03-20 DIAGNOSIS — I5189 Other ill-defined heart diseases: Secondary | ICD-10-CM

## 2014-03-20 NOTE — Progress Notes (Signed)
Patient ID: Anthony Kelly, male   DOB: January 29, 1945, 69 y.o.   MRN: [id number]      SUBJECTIVE: Anthony Kelly returns for routine follow up. He has been doing very well and denies chest pain, shortness of breath, palpitations, leg swelling, lightheadedness, and dizziness. He has not had any palpitations since finishing chemotherapy. He and his wife walked 3 miles yesterday.   Review of Systems: As per "subjective", otherwise negative.  No Known Allergies  Current Outpatient Prescriptions  Medication Sig Dispense Refill  . acetaminophen (TYLENOL) 500 MG tablet Take 1,000 mg by mouth every 4 (four) hours as needed for moderate pain or headache.    Marland Kitchen aspirin EC 81 MG tablet Take 81 mg by mouth every morning.    Marland Kitchen atorvastatin (LIPITOR) 10 MG tablet Take 10 mg by mouth every evening.     . diltiazem (CARDIZEM CD) 120 MG 24 hr capsule Take 1 capsule (120 mg total) by mouth every morning. (Patient taking differently: Take 120 mg by mouth every evening. ) 90 capsule 3  . metoprolol succinate (TOPROL-XL) 25 MG 24 hr tablet TAKE 1 TABLET DAILY (Patient taking differently: TAKE 1 TABLET DAILY EVERY MOORNING) 90 tablet 3  . Multiple Vitamin (MULTIVITAMIN WITH MINERALS) TABS tablet Take 1 tablet by mouth every morning.     Marland Kitchen omeprazole (PRILOSEC) 20 MG capsule Take 1 capsule (20 mg total) by mouth daily. (Patient taking differently: Take 20 mg by mouth daily as needed (indegestion.). ) 90 capsule 3  . Probiotic Product (PROBIOTIC DAILY PO) Take 1 capsule by mouth 3 (three) times daily.     Marland Kitchen ZYRTEC ALLERGY 10 MG tablet TAKE 1 TABLET DAILY 90 tablet 3   No current facility-administered medications for this visit.    Past Medical History  Diagnosis Date  . PSVT (paroxysmal supraventricular tachycardia)   . Arthritis   . 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 Metastasis to lymph nodes dx'd 03/2011    lt submandibular ln  . Atrial fibrillation     a. Isolated episode in the setting of acute pericarditis 04/2013. Was not placed on anticoag.  Marland Kitchen Acute pericarditis     a. 04/2013 -adm with CP, elevated CRP. H/o coronary artery calcification on prior CT but nuc was negative, EF 71%.  . Pain in joint, pelvic region and thigh 08/01/2013  . Diverticulitis 08/16/2013  . Hyperlipidemia     elevated triglycerides  . BPH (benign prostatic hyperplasia)   . Cataract     Past Surgical History  Procedure Laterality Date  . Lung lobectomy      right side  . Cholecystectomy    . Lymph node biopsy      in groin with removal  . Meniscus repair      right knee  . Vein ligation and stripping      right leg  . Tonsillectomy      as a child  . Submandibular gland excision  04/2011  . Submandibular gland excision  04/30/2011    Procedure: EXCISION SUBMANDIBULAR GLAND;  Surgeon: Jerrell Belfast, MD;  Location: [facility];  Service: ENT;  Laterality: Left;  WITH DIAGNOSTIC BIOPSY  . Exploratory laparotomy    .  Prostate surgery    . Refractive surgery Right     piece of metal removed    History   Social History  . Marital Status: Married    Spouse Name: N/A  . Number of Children: N/A  . Years of Education: N/A   Occupational History  . Not on file.   Social History Main Topics  . Smoking status: Former Smoker -- 1.50 packs/day for 25 years    Types: Cigarettes    Start date: 12/12/1962    Quit date: 01/11/1990  . Smokeless tobacco: Never Used  . Alcohol Use: No  . Drug Use: No  . Sexual Activity: Not Currently   Other Topics Concern  . Not on file   Social History Narrative     Filed Vitals:   03/20/14 0817  BP: 98/63  Pulse: 73  Height: 6' (1.829 m)  Weight: 196 lb (88.905 kg)  SpO2: 97%     PHYSICAL EXAM General: NAD HEENT: Normal. Neck: No JVD, no thyromegaly. Lungs: Clear to auscultation bilaterally with normal respiratory effort. CV: Nondisplaced PMI.  Regular rate and rhythm, normal S1/S2, no S3/S4, no murmur. No pretibial or periankle edema.  No carotid bruit.  Normal pedal pulses.  Abdomen: Soft, nontender, no hepatosplenomegaly, no distention.  Neurologic: Alert and oriented x 3.  Psych: Normal affect. Skin: Normal. Musculoskeletal: Normal range of motion, no gross deformities. Extremities: No clubbing or cyanosis.   ECG: Most recent ECG reviewed.      ASSESSMENT AND PLAN: 1. Pericarditis: No recurrence of symptoms. 2. PSVT/PAF: No recurrences. Continue long-acting diltiazem and long-acting metoprolol.  3. Coronary artery calcifications on CT: Normal nuclear MPI study, thus no further testing is indicated at this time. Continue ASA, beta blocker, and statin therapy. 4. Grade II diastolic dysfunction: No evidence of heart failure.  Dispo: f/u 1year.   Kate Sable, M.D., F.A.C.C.

## 2014-03-20 NOTE — Patient Instructions (Signed)
Your physician wants you to follow-up in: 1 YEAR WITH DR KONESWARAN You will receive a reminder letter in the mail two months in advance. If you don't receive a letter, please call our office to schedule the follow-up appointment.  Your physician recommends that you continue on your current medications as directed. Please refer to the Current Medication list given to you today.  Thank you for choosing [facility]!!    

## 2014-04-09 ENCOUNTER — Other Ambulatory Visit: Payer: Self-pay | Admitting: Hematology and Oncology

## 2014-04-10 ENCOUNTER — Encounter: Payer: Self-pay | Admitting: Family Medicine

## 2014-04-12 MED ORDER — TAMSULOSIN HCL 0.4 MG PO CAPS: 0.4000 mg | ORAL_CAPSULE | Freq: Every day | ORAL | Status: DC

## 2014-04-14 ENCOUNTER — Encounter: Payer: Self-pay | Admitting: Family Medicine

## 2014-04-15 ENCOUNTER — Other Ambulatory Visit: Payer: Self-pay | Admitting: *Deleted

## 2014-04-15 MED ORDER — TAMSULOSIN HCL 0.4 MG PO CAPS: 0.4000 mg | ORAL_CAPSULE | Freq: Every day | ORAL | Status: DC

## 2014-05-05 ENCOUNTER — Encounter: Payer: Self-pay | Admitting: Family Medicine

## 2014-05-06 ENCOUNTER — Encounter (INDEPENDENT_AMBULATORY_CARE_PROVIDER_SITE_OTHER): Payer: Medicare Other | Admitting: Ophthalmology

## 2014-05-06 DIAGNOSIS — H2513 Age-related nuclear cataract, bilateral: Secondary | ICD-10-CM | POA: Diagnosis not present

## 2014-05-06 DIAGNOSIS — H33303 Unspecified retinal break, bilateral: Secondary | ICD-10-CM | POA: Diagnosis not present

## 2014-05-06 DIAGNOSIS — H43813 Vitreous degeneration, bilateral: Secondary | ICD-10-CM | POA: Diagnosis not present

## 2014-05-06 DIAGNOSIS — H4312 Vitreous hemorrhage, left eye: Secondary | ICD-10-CM | POA: Diagnosis not present

## 2014-05-14 ENCOUNTER — Encounter: Payer: Self-pay | Admitting: Family Medicine

## 2014-05-15 NOTE — Telephone Encounter (Signed)
I called and spoke with family

## 2014-05-17 ENCOUNTER — Ambulatory Visit: Payer: Medicare Other | Admitting: Family

## 2014-05-20 ENCOUNTER — Ambulatory Visit (INDEPENDENT_AMBULATORY_CARE_PROVIDER_SITE_OTHER): Payer: Medicare Other | Admitting: Ophthalmology

## 2014-05-20 ENCOUNTER — Encounter: Payer: Self-pay | Admitting: Nurse Practitioner

## 2014-05-20 ENCOUNTER — Ambulatory Visit (INDEPENDENT_AMBULATORY_CARE_PROVIDER_SITE_OTHER): Payer: Medicare Other | Admitting: Nurse Practitioner

## 2014-05-20 VITALS — BP 128/77 | HR 58 | Temp 96.7°F | Ht 72.0 in | Wt 197.0 lb

## 2014-05-20 DIAGNOSIS — C8510 Unspecified B-cell lymphoma, unspecified site: Secondary | ICD-10-CM

## 2014-05-20 DIAGNOSIS — I2510 Atherosclerotic heart disease of native coronary artery without angina pectoris: Secondary | ICD-10-CM | POA: Diagnosis not present

## 2014-05-20 DIAGNOSIS — H33302 Unspecified retinal break, left eye: Secondary | ICD-10-CM

## 2014-05-20 DIAGNOSIS — K5733 Diverticulitis of large intestine without perforation or abscess with bleeding: Secondary | ICD-10-CM | POA: Diagnosis not present

## 2014-05-20 DIAGNOSIS — E785 Hyperlipidemia, unspecified: Secondary | ICD-10-CM | POA: Diagnosis not present

## 2014-05-20 DIAGNOSIS — I480 Paroxysmal atrial fibrillation: Secondary | ICD-10-CM

## 2014-05-20 DIAGNOSIS — Z8679 Personal history of other diseases of the circulatory system: Secondary | ICD-10-CM | POA: Diagnosis not present

## 2014-05-20 DIAGNOSIS — K219 Gastro-esophageal reflux disease without esophagitis: Secondary | ICD-10-CM

## 2014-05-20 NOTE — Addendum Note (Signed)
Addended by: Rolena Infante on: 05/20/2014 10:36 AM   Modules accepted: Orders

## 2014-05-20 NOTE — Progress Notes (Signed)
   Subjective:    Patient ID: Anthony Kelly, male    DOB: 08/08/1945, 69 y.o.   MRN: [id number]   Patient here today for follow up of chronic medical problems.   Hyperlipidemia This is a chronic problem. The current episode started more than 1 year ago. Recent lipid tests were reviewed and are variable. He has no history of diabetes, hypothyroidism or obesity. There are no known factors aggravating his hyperlipidemia. Current antihyperlipidemic treatment includes statins. The current treatment provides moderate improvement of lipids. There are no compliance problems.  Risk factors for coronary artery disease include dyslipidemia and male sex.  Atrial fib/ PSVT Metoprolol and diltiazem working well. No c/o palpitations- saw cardiologist 2 months ago GERD Occasionally- does not currently take any medications Lymphoma Has had 3 rounds of chemo over the last few years- he is currently in renission  Review of Systems  Constitutional: Negative.   HENT: Negative.   Respiratory: Negative.   Cardiovascular: Negative.   Gastrointestinal: Negative.   Genitourinary: Negative.   Neurological: Negative.   Psychiatric/Behavioral: Negative.   All other systems reviewed and are negative.      Objective:   Physical Exam  Constitutional: He is oriented to person, place, and time. He appears well-developed and well-nourished.  HENT:  Head: Normocephalic.  Right Ear: External ear normal.  Left Ear: External ear normal.  Nose: Nose normal.  Mouth/Throat: Oropharynx is clear and moist.  Eyes: EOM are normal. Pupils are equal, round, and reactive to light.  Neck: Normal range of motion. Neck supple. No JVD present. No thyromegaly present.  Cardiovascular: Normal rate, regular rhythm, normal heart sounds and intact distal pulses.  Exam reveals no gallop and no friction rub.   No murmur heard. Pulmonary/Chest: Effort normal and breath sounds normal. No respiratory distress. He has no wheezes. He has  no rales. He exhibits no tenderness.  Abdominal: Soft. Bowel sounds are normal. He exhibits no mass. There is no tenderness.  Musculoskeletal: Normal range of motion. He exhibits no edema.  Lymphadenopathy:    He has no cervical adenopathy.  Neurological: He is alert and oriented to person, place, and time. No cranial nerve deficit.  Skin: Skin is warm and dry.  Psychiatric: He has a normal mood and affect. His behavior is normal. Judgment and thought content normal.   BP 128/77 mmHg  Pulse 58  Temp(Src) 96.7 F (35.9 C) (Oral)  Ht 6' (1.829 m)  Wt 197 lb (89.359 kg)  BMI 26.71 kg/m2        Assessment & Plan:  1. Hyperlipidemia Low fat diet  2. Gastroesophageal reflux disease without esophagitis Avoid spicy foods Do not eat 2 hours prior to bedtime   3. Diverticulitis of large intestine without perforation or abscess with bleeding Watch foods with skin and small seeds  4. History of PSVT (paroxysmal supraventricular tachycardia)  5. PAF (paroxysmal atrial fibrillation)  6. Low grade B-cell lymphoma    Labs pending Health maintenance reviewed Diet and exercise encouraged Continue all meds Follow up  In 6 months   [place], FNP

## 2014-05-20 NOTE — Patient Instructions (Signed)
Fat and Cholesterol Control Diet Fat and cholesterol levels in your blood and organs are influenced by your diet. High levels of fat and cholesterol may lead to diseases of the heart, small and large blood vessels, gallbladder, liver, and pancreas. CONTROLLING FAT AND CHOLESTEROL WITH DIET Although exercise and lifestyle factors are important, your diet is key. That is because certain foods are known to raise cholesterol and others to lower it. The goal is to balance foods for their effect on cholesterol and more importantly, to replace saturated and trans fat with other types of fat, such as monounsaturated fat, polyunsaturated fat, and omega-3 fatty acids. On average, a person should consume no more than 15 to 17 g of saturated fat daily. Saturated and trans fats are considered "bad" fats, and they will raise LDL cholesterol. Saturated fats are primarily found in animal products such as meats, butter, and cream. However, that does not mean you need to give up all your favorite foods. Today, there are good tasting, low-fat, low-cholesterol substitutes for most of the things you like to eat. Choose low-fat or nonfat alternatives. Choose round or loin cuts of red meat. These types of cuts are lowest in fat and cholesterol. Chicken (without the skin), fish, veal, and ground turkey breast are great choices. Eliminate fatty meats, such as hot dogs and salami. Even shellfish have little or no saturated fat. Have a 3 oz (85 g) portion when you eat lean meat, poultry, or fish. Trans fats are also called "partially hydrogenated oils." They are oils that have been scientifically manipulated so that they are solid at room temperature resulting in a longer shelf life and improved taste and texture of foods in which they are added. Trans fats are found in stick margarine, some tub margarines, cookies, crackers, and baked goods.  When baking and cooking, oils are a great substitute for butter. The monounsaturated oils are  especially beneficial since it is believed they lower LDL and raise HDL. The oils you should avoid entirely are saturated tropical oils, such as coconut and palm.  Remember to eat a lot from food groups that are naturally free of saturated and trans fat, including fish, fruit, vegetables, beans, grains (barley, rice, couscous, bulgur wheat), and pasta (without cream sauces).  IDENTIFYING FOODS THAT LOWER FAT AND CHOLESTEROL  Soluble fiber may lower your cholesterol. This type of fiber is found in fruits such as apples, vegetables such as broccoli, potatoes, and carrots, legumes such as beans, peas, and lentils, and grains such as barley. Foods fortified with plant sterols (phytosterol) may also lower cholesterol. You should eat at least 2 g per day of these foods for a cholesterol lowering effect.  Read package labels to identify low-saturated fats, trans fat free, and low-fat foods at the supermarket. Select cheeses that have only 2 to 3 g saturated fat per ounce. Use a heart-healthy tub margarine that is free of trans fats or partially hydrogenated oil. When buying baked goods (cookies, crackers), avoid partially hydrogenated oils. Breads and muffins should be made from whole grains (whole-wheat or whole oat flour, instead of "flour" or "enriched flour"). Buy non-creamy canned soups with reduced salt and no added fats.  FOOD PREPARATION TECHNIQUES  Never deep-fry. If you must fry, either stir-fry, which uses very little fat, or use non-stick cooking sprays. When possible, broil, bake, or roast meats, and steam vegetables. Instead of putting butter or margarine on vegetables, use lemon and herbs, applesauce, and cinnamon (for squash and sweet potatoes). Use nonfat   yogurt, salsa, and low-fat dressings for salads.  LOW-SATURATED FAT / LOW-FAT FOOD SUBSTITUTES Meats / Saturated Fat (g)  Avoid: Steak, marbled (3 oz/85 g) / 11 g  Choose: Steak, lean (3 oz/85 g) / 4 g  Avoid: Hamburger (3 oz/85 g) / 7  g  Choose: Hamburger, lean (3 oz/85 g) / 5 g  Avoid: Ham (3 oz/85 g) / 6 g  Choose: Ham, lean cut (3 oz/85 g) / 2.4 g  Avoid: Chicken, with skin, dark meat (3 oz/85 g) / 4 g  Choose: Chicken, skin removed, dark meat (3 oz/85 g) / 2 g  Avoid: Chicken, with skin, light meat (3 oz/85 g) / 2.5 g  Choose: Chicken, skin removed, light meat (3 oz/85 g) / 1 g Dairy / Saturated Fat (g)  Avoid: Whole milk (1 cup) / 5 g  Choose: Low-fat milk, 2% (1 cup) / 3 g  Choose: Low-fat milk, 1% (1 cup) / 1.5 g  Choose: Skim milk (1 cup) / 0.3 g  Avoid: Hard cheese (1 oz/28 g) / 6 g  Choose: Skim milk cheese (1 oz/28 g) / 2 to 3 g  Avoid: Cottage cheese, 4% fat (1 cup) / 6.5 g  Choose: Low-fat cottage cheese, 1% fat (1 cup) / 1.5 g  Avoid: Ice cream (1 cup) / 9 g  Choose: Sherbet (1 cup) / 2.5 g  Choose: Nonfat frozen yogurt (1 cup) / 0.3 g  Choose: Frozen fruit bar / trace  Avoid: Whipped cream (1 tbs) / 3.5 g  Choose: Nondairy whipped topping (1 tbs) / 1 g Condiments / Saturated Fat (g)  Avoid: Mayonnaise (1 tbs) / 2 g  Choose: Low-fat mayonnaise (1 tbs) / 1 g  Avoid: Butter (1 tbs) / 7 g  Choose: Extra light margarine (1 tbs) / 1 g  Avoid: Coconut oil (1 tbs) / 11.8 g  Choose: Olive oil (1 tbs) / 1.8 g  Choose: Corn oil (1 tbs) / 1.7 g  Choose: Safflower oil (1 tbs) / 1.2 g  Choose: Sunflower oil (1 tbs) / 1.4 g  Choose: Soybean oil (1 tbs) / 2.4 g  Choose: Canola oil (1 tbs) / 1 g Document Released: 12/28/2004 Document Revised: 04/24/2012 Document Reviewed: 03/28/2013 [employer] Patient Information 2015 [employer]. This information is not intended to replace advice given to you by your health care provider. Make sure you discuss any questions you have with your health care provider.  

## 2014-05-21 ENCOUNTER — Ambulatory Visit: Payer: Medicare Other | Admitting: Family

## 2014-05-21 LAB — CMP14+EGFR
ALT: 25 IU/L (ref 0–44)
AST: 24 IU/L (ref 0–40)
Albumin/Globulin Ratio: 2.2 (ref 1.1–2.5)
Albumin: 4.2 g/dL (ref 3.6–4.8)
Alkaline Phosphatase: 62 IU/L (ref 39–117)
BILIRUBIN TOTAL: 0.7 mg/dL (ref 0.0–1.2)
BUN/Creatinine Ratio: 15 (ref 10–22)
BUN: 15 mg/dL (ref 8–27)
CO2: 26 mmol/L (ref 18–29)
Calcium: 9.1 mg/dL (ref 8.6–10.2)
Chloride: 102 mmol/L (ref 97–108)
Creatinine, Ser: 1 mg/dL (ref 0.76–1.27)
GFR calc non Af Amer: 76 mL/min/{1.73_m2} (ref >59)
GFR, EST AFRICAN AMERICAN: 88 mL/min/{1.73_m2} (ref >59)
Globulin, Total: 1.9 g/dL (ref 1.5–4.5)
Glucose: 97 mg/dL (ref 65–99)
POTASSIUM: 4.8 mmol/L (ref 3.5–5.2)
Sodium: 143 mmol/L (ref 134–144)
Total Protein: 6.1 g/dL (ref 6.0–8.5)

## 2014-05-21 LAB — NMR, LIPOPROFILE
Cholesterol: 96 mg/dL — ABNORMAL LOW (ref 100–199)
HDL CHOLESTEROL BY NMR: 42 mg/dL (ref >39)
HDL PARTICLE NUMBER: 30.8 umol/L (ref >=30.5)
LDL PARTICLE NUMBER: 347 nmol/L (ref <1000)
LDL Size: 20.4 nm (ref >20.5)
LDL-C: 26 mg/dL (ref 0–99)
LP-IR Score: 47 — ABNORMAL HIGH (ref <=45)
Small LDL Particle Number: 105 nmol/L (ref <=527)
Triglycerides by NMR: 140 mg/dL (ref 0–149)

## 2014-06-28 ENCOUNTER — Encounter: Payer: Self-pay | Admitting: Hematology and Oncology

## 2014-06-28 ENCOUNTER — Other Ambulatory Visit (HOSPITAL_BASED_OUTPATIENT_CLINIC_OR_DEPARTMENT_OTHER): Payer: Medicare Other

## 2014-06-28 ENCOUNTER — Telehealth: Payer: Self-pay | Admitting: Hematology and Oncology

## 2014-06-28 ENCOUNTER — Ambulatory Visit (HOSPITAL_BASED_OUTPATIENT_CLINIC_OR_DEPARTMENT_OTHER): Payer: Medicare Other | Admitting: Hematology and Oncology

## 2014-06-28 VITALS — BP 120/73 | HR 63 | Temp 97.9°F | Resp 18 | Wt 196.7 lb

## 2014-06-28 DIAGNOSIS — C8510 Unspecified B-cell lymphoma, unspecified site: Secondary | ICD-10-CM

## 2014-06-28 DIAGNOSIS — D72819 Decreased white blood cell count, unspecified: Secondary | ICD-10-CM | POA: Diagnosis not present

## 2014-06-28 LAB — CBC WITH DIFFERENTIAL/PLATELET
BASO%: 0.2 % (ref 0.0–2.0)
Basophils Absolute: 0 10*3/uL (ref 0.0–0.1)
EOS%: 3.8 % (ref 0.0–7.0)
Eosinophils Absolute: 0.1 10*3/uL (ref 0.0–0.5)
HCT: 44.6 % (ref 38.4–49.9)
HEMOGLOBIN: 15.2 g/dL (ref 13.0–17.1)
LYMPH%: 29.8 % (ref 14.0–49.0)
MCH: 30.8 pg (ref 27.2–33.4)
MCHC: 34.1 g/dL (ref 32.0–36.0)
MCV: 90.4 fL (ref 79.3–98.0)
MONO#: 0.5 10*3/uL (ref 0.1–0.9)
MONO%: 15.7 % — AB (ref 0.0–14.0)
NEUT#: 1.6 10*3/uL (ref 1.5–6.5)
NEUT%: 50.5 % (ref 39.0–75.0)
Platelets: 147 10*3/uL (ref 140–400)
RBC: 4.93 10*6/uL (ref 4.20–5.82)
RDW: 12.6 % (ref 11.0–14.6)
WBC: 3.2 10*3/uL — ABNORMAL LOW (ref 4.0–10.3)
lymph#: 0.9 10*3/uL (ref 0.9–3.3)

## 2014-06-28 LAB — COMPREHENSIVE METABOLIC PANEL (CC13)
ALT: 25 U/L (ref 0–55)
ANION GAP: 7 meq/L (ref 3–11)
AST: 24 U/L (ref 5–34)
Albumin: 3.9 g/dL (ref 3.5–5.0)
Alkaline Phosphatase: 56 U/L (ref 40–150)
BILIRUBIN TOTAL: 0.82 mg/dL (ref 0.20–1.20)
BUN: 13.6 mg/dL (ref 7.0–26.0)
CALCIUM: 9 mg/dL (ref 8.4–10.4)
CO2: 27 mEq/L (ref 22–29)
Chloride: 108 mEq/L (ref 98–109)
Creatinine: 1.1 mg/dL (ref 0.7–1.3)
EGFR: 66 mL/min/{1.73_m2} — AB (ref >90)
Glucose: 101 mg/dl (ref 70–140)
Potassium: 4.5 mEq/L (ref 3.5–5.1)
Sodium: 141 mEq/L (ref 136–145)
TOTAL PROTEIN: 6.1 g/dL — AB (ref 6.4–8.3)

## 2014-06-28 LAB — LACTATE DEHYDROGENASE (CC13): LDH: 258 U/L — ABNORMAL HIGH (ref 125–245)

## 2014-06-28 NOTE — Telephone Encounter (Signed)
Gave and printed appt sched and avs fo rpt for SEpt ......gave barium

## 2014-06-28 NOTE — Progress Notes (Signed)
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   12/12/2003 - 06/01/2004 Chemotherapy He was treated with R. CHOP chemotherapy which show complete remission. The number of cycles of R. CHOP chemotherapy was unknown.   12/19/2006 Surgery Lung resection show follicular lymphoma.   01/02/2007 - 09/01/2008 Chemotherapy The patient was treated with single agent rituximab alone.   01/19/2007 Bone Marrow Biopsy Bone marrow biopsy was negative.   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   05/08/2013 - 05/11/2013 Hospital Admission The patient was admitted to the hospital for management of pericarditis. CT scan showed extensive lymphadenopathy.   06/07/2013 Imaging PET/CT scan showed extensive lymphadenopathy   06/25/2013 Procedure He has placement of Infuse-a-Port.   06/28/2013 Bone Marrow Biopsy Bone marrow biopsy is positive for lymphoma involvement.   07/04/2013 - 11/22/2013 Chemotherapy He is treated with 6 cycles of bendamustine with rituximab.   09/24/2013 Imaging Repeat PET scan show complete remission.   12/26/2013 Imaging PEt scan showed complete remission    INTERVAL HISTORY: Please see below for problem oriented charting. He recently also further follow-up. He denies new lymphadenopathy. Denies recent infection.  REVIEW OF SYSTEMS:   Constitutional:  Denies fevers, chills or abnormal weight loss Eyes: Denies blurriness of vision Ears, nose, mouth, throat, and face: Denies mucositis or sore throat Respiratory: Denies cough, dyspnea or wheezes Cardiovascular: Denies palpitation, chest discomfort or lower extremity swelling Gastrointestinal:  Denies nausea, heartburn or change in bowel habits Skin: Denies abnormal skin rashes Lymphatics: Denies new lymphadenopathy or easy bruising Neurological:Denies numbness, tingling or new weaknesses Behavioral/Psych: Mood is stable, no new changes  All other systems were reviewed with the patient and are negative.  I have reviewed the past medical history, past surgical history, social history and family history with the patient and they are unchanged from previous note.  ALLERGIES:  has No Known Allergies.  MEDICATIONS:  Current Outpatient Prescriptions  Medication Sig Dispense Refill  . aspirin EC 81 MG tablet Take 81 mg by mouth every morning.    Marland Kitchen atorvastatin (LIPITOR) 10 MG tablet Take 10 mg by mouth every evening.     . diltiazem (CARDIZEM CD) 120 MG 24 hr capsule Take 1 capsule (120 mg total) by mouth every morning. (Patient taking differently: Take 120 mg by mouth every evening. ) 90 capsule 3  . metoprolol succinate (TOPROL-XL) 25 MG 24 hr tablet TAKE 1 TABLET DAILY (Patient taking differently: TAKE 1 TABLET DAILY EVERY MOORNING) 90 tablet 3  . Multiple Vitamin (MULTIVITAMIN WITH MINERALS) TABS tablet Take 1 tablet by mouth every morning.     . Probiotic Product (PROBIOTIC DAILY PO) Take 1 capsule by mouth 3 (three) times daily.     . ranitidine (ZANTAC) 150 MG tablet Take 150 mg by mouth 2 (two) times daily as needed for heartburn.     No current facility-administered medications for this visit.  PHYSICAL EXAMINATION: ECOG PERFORMANCE STATUS: 0 - Asymptomatic  Filed Vitals:   06/28/14 0850  BP: 120/73  Pulse: 63  Temp: 97.9 F (36.6 C)  Resp: 18   Filed Weights   06/28/14  0850  Weight: 196 lb 11.2 oz (89.223 kg)    GENERAL:alert, no distress and comfortable SKIN: skin color, texture, turgor are normal, no rashes or significant lesions EYES: normal, Conjunctiva are pink and non-injected, sclera clear OROPHARYNX:no exudate, no erythema and lips, buccal mucosa, and tongue normal  NECK: supple, thyroid normal size, non-tender, without nodularity LYMPH:  no palpable lymphadenopathy in the cervical, axillary or inguinal LUNGS: clear to auscultation and percussion with normal breathing effort HEART: regular rate & rhythm and no murmurs and no lower extremity edema ABDOMEN:abdomen soft, non-tender and normal bowel sounds Musculoskeletal:no cyanosis of digits and no clubbing  NEURO: alert & oriented x 3 with fluent speech, no focal motor/sensory deficits  LABORATORY DATA:  I have reviewed the data as listed    Component Value Date/Time   NA 141 06/28/2014 0810   NA 143 05/20/2014 1103   NA 144 12/07/2013 1447   NA 139 07/20/2011 0800   K 4.5 06/28/2014 0810   K 4.8 05/20/2014 1103   K 4.4 07/20/2011 0800   CL 102 05/20/2014 1103   CL 104 03/24/2012 1024   CL 100 07/20/2011 0800   CO2 27 06/28/2014 0810   CO2 26 05/20/2014 1103   CO2 29 07/20/2011 0800   GLUCOSE 101 06/28/2014 0810   GLUCOSE 97 05/20/2014 1103   GLUCOSE 108* 12/07/2013 1447   GLUCOSE 80 03/24/2012 1024   GLUCOSE 100 07/20/2011 0800   BUN 13.6 06/28/2014 0810   BUN 15 05/20/2014 1103   BUN 10 12/07/2013 1447   BUN 14 07/20/2011 0800   CREATININE 1.1 06/28/2014 0810   CREATININE 1.00 05/20/2014 1103   CREATININE 1.2 07/20/2011 0800   CALCIUM 9.0 06/28/2014 0810   CALCIUM 9.1 05/20/2014 1103   CALCIUM 8.7 07/20/2011 0800   PROT 6.1* 06/28/2014 0810   PROT 6.1 05/20/2014 1103   PROT 6.6 12/07/2013 1447   PROT 7.2 07/20/2011 0800   ALBUMIN 3.9 06/28/2014 0810   ALBUMIN 3.6 12/07/2013 1447   AST 24 06/28/2014 0810   AST 24 05/20/2014 1103   AST 25 07/20/2011 0800   ALT 25  06/28/2014 0810   ALT 25 05/20/2014 1103   ALT 29 07/20/2011 0800   ALKPHOS 56 06/28/2014 0810   ALKPHOS 62 05/20/2014 1103   ALKPHOS 55 07/20/2011 0800   BILITOT 0.82 06/28/2014 0810   BILITOT 0.7 05/20/2014 1103   BILITOT 0.6 01/18/2014 1054   BILITOT 1.00 07/20/2011 0800   GFRNONAA 76 05/20/2014 1103   GFRAA 88 05/20/2014 1103    No results found for: SPEP, UPEP  Lab Results  Component Value Date   WBC 3.2* 06/28/2014   NEUTROABS 1.6 06/28/2014   HGB 15.2 06/28/2014   HCT 44.6 06/28/2014   MCV 90.4 06/28/2014   PLT 147 06/28/2014      Chemistry      Component Value Date/Time   NA 141 06/28/2014 0810   NA 143 05/20/2014 1103   NA 144 12/07/2013 1447   NA 139 07/20/2011 0800   K 4.5 06/28/2014 0810   K 4.8 05/20/2014 1103   K 4.4 07/20/2011 0800   CL 102 05/20/2014 1103   CL 104 03/24/2012 1024   CL 100 07/20/2011 0800   CO2 27 06/28/2014 0810   CO2 26  05/20/2014 1103   CO2 29 07/20/2011 0800   BUN 13.6 06/28/2014 0810   BUN 15 05/20/2014 1103   BUN 10 12/07/2013 1447   BUN 14 07/20/2011 0800   CREATININE 1.1 06/28/2014 0810   CREATININE 1.00 05/20/2014 1103   CREATININE 1.2 07/20/2011 0800      Component Value Date/Time   CALCIUM 9.0 06/28/2014 0810   CALCIUM 9.1 05/20/2014 1103   CALCIUM 8.7 07/20/2011 0800   ALKPHOS 56 06/28/2014 0810   ALKPHOS 62 05/20/2014 1103   ALKPHOS 55 07/20/2011 0800   AST 24 06/28/2014 0810   AST 24 05/20/2014 1103   AST 25 07/20/2011 0800   ALT 25 06/28/2014 0810   ALT 25 05/20/2014 1103   ALT 29 07/20/2011 0800   BILITOT 0.82 06/28/2014 0810   BILITOT 0.7 05/20/2014 1103   BILITOT 0.6 01/18/2014 1054   BILITOT 1.00 07/20/2011 0800      ASSESSMENT & PLAN:  Low grade B-cell lymphoma He had complete response to Rx.  The patient would be high risk of relapse. He had mildly elevated LDH today. I will proceed to order blood work, CT scan of the chest, abdomen and pelvis before see him back in 3 months to exclude  disease recurrence.  Leukopenia He is not symptomatic. The cause is unknown. I will observe for now.   Orders Placed This Encounter  Procedures  . CT Chest W Contrast    Standing Status: Future     Number of Occurrences:      Standing Expiration Date: 09/28/2015    Order Specific Question:  Reason for Exam (SYMPTOM  OR DIAGNOSIS REQUIRED)    Answer:  staging lymphoma    Order Specific Question:  Preferred imaging location?    Answer:  [facility]  . CT Abdomen Pelvis W Contrast    Standing Status: Future     Number of Occurrences:      Standing Expiration Date: 09/28/2015    Order Specific Question:  Reason for Exam (SYMPTOM  OR DIAGNOSIS REQUIRED)    Answer:  lymphoma staging    Order Specific Question:  Preferred imaging location?    Answer:  [facility]  . CBC with Differential/Platelet    Standing Status: Future     Number of Occurrences:      Standing Expiration Date: 09/28/2015  . Comprehensive metabolic panel    Standing Status: Future     Number of Occurrences:      Standing Expiration Date: 09/28/2015  . Lactate dehydrogenase    Standing Status: Future     Number of Occurrences:      Standing Expiration Date: 09/28/2015   All questions were answered. The patient knows to call the clinic with any problems, questions or concerns. No barriers to learning was detected. I spent 15 minutes counseling the patient face to face. The total time spent in the appointment was 20 minutes and more than 50% was on counseling and review of test results     [facility], [place], MD 06/28/2014 9:15 AM

## 2014-06-28 NOTE — Assessment & Plan Note (Signed)
He is not symptomatic. The cause is unknown. I will observe for now.

## 2014-06-28 NOTE — Assessment & Plan Note (Signed)
He had complete response to Rx.  The patient would be high risk of relapse. He had mildly elevated LDH today. I will proceed to order blood work, CT scan of the chest, abdomen and pelvis before see him back in 3 months to exclude disease recurrence.

## 2014-07-01 ENCOUNTER — Ambulatory Visit (INDEPENDENT_AMBULATORY_CARE_PROVIDER_SITE_OTHER): Payer: Medicare Other | Admitting: Ophthalmology

## 2014-07-08 DIAGNOSIS — H31011 Macula scars of posterior pole (postinflammatory) (post-traumatic), right eye: Secondary | ICD-10-CM | POA: Diagnosis not present

## 2014-07-08 DIAGNOSIS — H25012 Cortical age-related cataract, left eye: Secondary | ICD-10-CM | POA: Diagnosis not present

## 2014-07-08 DIAGNOSIS — H31012 Macula scars of posterior pole (postinflammatory) (post-traumatic), left eye: Secondary | ICD-10-CM | POA: Diagnosis not present

## 2014-07-08 DIAGNOSIS — H2511 Age-related nuclear cataract, right eye: Secondary | ICD-10-CM | POA: Diagnosis not present

## 2014-07-08 DIAGNOSIS — H25011 Cortical age-related cataract, right eye: Secondary | ICD-10-CM | POA: Diagnosis not present

## 2014-07-08 DIAGNOSIS — H2512 Age-related nuclear cataract, left eye: Secondary | ICD-10-CM | POA: Diagnosis not present

## 2014-07-23 DIAGNOSIS — H2511 Age-related nuclear cataract, right eye: Secondary | ICD-10-CM | POA: Diagnosis not present

## 2014-07-31 DIAGNOSIS — H2511 Age-related nuclear cataract, right eye: Secondary | ICD-10-CM | POA: Diagnosis not present

## 2014-08-08 ENCOUNTER — Encounter: Payer: Self-pay | Admitting: Nurse Practitioner

## 2014-08-09 ENCOUNTER — Encounter: Payer: Self-pay | Admitting: Nurse Practitioner

## 2014-08-09 ENCOUNTER — Other Ambulatory Visit (INDEPENDENT_AMBULATORY_CARE_PROVIDER_SITE_OTHER): Payer: Medicare Other | Admitting: Nurse Practitioner

## 2014-08-09 ENCOUNTER — Other Ambulatory Visit: Payer: Medicare Other

## 2014-08-09 ENCOUNTER — Telehealth: Payer: Self-pay | Admitting: *Deleted

## 2014-08-09 DIAGNOSIS — Z87440 Personal history of urinary (tract) infections: Secondary | ICD-10-CM

## 2014-08-09 LAB — POCT URINALYSIS DIPSTICK
Bilirubin, UA: NEGATIVE
Blood, UA: NEGATIVE
Glucose, UA: NEGATIVE
Ketones, UA: NEGATIVE
LEUKOCYTES UA: NEGATIVE
Nitrite, UA: NEGATIVE
PH UA: 6
PROTEIN UA: NEGATIVE
Urobilinogen, UA: NEGATIVE

## 2014-08-09 LAB — POCT UA - MICROSCOPIC ONLY
Bacteria, U Microscopic: NEGATIVE
CASTS, UR, LPF, POC: NEGATIVE
Crystals, Ur, HPF, POC: NEGATIVE
MUCUS UA: NEGATIVE
RBC, URINE, MICROSCOPIC: NEGATIVE
WBC, Ur, HPF, POC: NEGATIVE
YEAST UA: NEGATIVE

## 2014-08-09 NOTE — Progress Notes (Signed)
Lab only 

## 2014-08-09 NOTE — Telephone Encounter (Signed)
Left message with lab results

## 2014-08-09 NOTE — Telephone Encounter (Signed)
-----   Message from Chevis Pretty, [place] sent at 08/09/2014 12:11 PM EDT ----- Urine clear- please let patient know

## 2014-08-19 ENCOUNTER — Other Ambulatory Visit: Payer: Medicare Other

## 2014-08-19 DIAGNOSIS — R799 Abnormal finding of blood chemistry, unspecified: Secondary | ICD-10-CM | POA: Diagnosis not present

## 2014-08-20 LAB — LIPID PANEL
Chol/HDL Ratio: 2.6 ratio units (ref 0.0–5.0)
Cholesterol, Total: 108 mg/dL (ref 100–199)
HDL: 42 mg/dL (ref >39)
LDL Calculated: 43 mg/dL (ref 0–99)
Triglycerides: 115 mg/dL (ref 0–149)
VLDL Cholesterol Cal: 23 mg/dL (ref 5–40)

## 2014-08-20 LAB — CMP14+EGFR
ALK PHOS: 65 IU/L (ref 39–117)
ALT: 18 IU/L (ref 0–44)
AST: 16 IU/L (ref 0–40)
Albumin/Globulin Ratio: 1.9 (ref 1.1–2.5)
Albumin: 4.2 g/dL (ref 3.6–4.8)
BILIRUBIN TOTAL: 0.5 mg/dL (ref 0.0–1.2)
BUN / CREAT RATIO: 15 (ref 10–22)
BUN: 15 mg/dL (ref 8–27)
CO2: 24 mmol/L (ref 18–29)
Calcium: 9.2 mg/dL (ref 8.6–10.2)
Chloride: 101 mmol/L (ref 97–108)
Creatinine, Ser: 1 mg/dL (ref 0.76–1.27)
GFR calc Af Amer: 88 mL/min/{1.73_m2} (ref >59)
GFR calc non Af Amer: 76 mL/min/{1.73_m2} (ref >59)
GLOBULIN, TOTAL: 2.2 g/dL (ref 1.5–4.5)
Glucose: 105 mg/dL — ABNORMAL HIGH (ref 65–99)
POTASSIUM: 5 mmol/L (ref 3.5–5.2)
SODIUM: 141 mmol/L (ref 134–144)
TOTAL PROTEIN: 6.4 g/dL (ref 6.0–8.5)

## 2014-08-21 DIAGNOSIS — L820 Inflamed seborrheic keratosis: Secondary | ICD-10-CM | POA: Diagnosis not present

## 2014-08-21 DIAGNOSIS — B079 Viral wart, unspecified: Secondary | ICD-10-CM | POA: Diagnosis not present

## 2014-09-18 ENCOUNTER — Encounter (INDEPENDENT_AMBULATORY_CARE_PROVIDER_SITE_OTHER): Payer: Medicare Other | Admitting: Ophthalmology

## 2014-09-18 DIAGNOSIS — H43813 Vitreous degeneration, bilateral: Secondary | ICD-10-CM

## 2014-09-18 DIAGNOSIS — H3531 Nonexudative age-related macular degeneration: Secondary | ICD-10-CM

## 2014-09-18 DIAGNOSIS — H33303 Unspecified retinal break, bilateral: Secondary | ICD-10-CM | POA: Diagnosis not present

## 2014-09-20 ENCOUNTER — Ambulatory Visit (INDEPENDENT_AMBULATORY_CARE_PROVIDER_SITE_OTHER): Payer: Medicare Other | Admitting: Ophthalmology

## 2014-09-26 ENCOUNTER — Encounter (HOSPITAL_COMMUNITY): Payer: Self-pay

## 2014-09-26 ENCOUNTER — Ambulatory Visit (HOSPITAL_COMMUNITY)
Admission: RE | Admit: 2014-09-26 | Discharge: 2014-09-26 | Disposition: A | Discharge status: Home / Self Care | Payer: Medicare Other | Source: Ambulatory Visit | Attending: Hematology and Oncology | Admitting: Hematology and Oncology

## 2014-09-26 ENCOUNTER — Other Ambulatory Visit (HOSPITAL_BASED_OUTPATIENT_CLINIC_OR_DEPARTMENT_OTHER): Payer: Medicare Other

## 2014-09-26 DIAGNOSIS — I2510 Atherosclerotic heart disease of native coronary artery without angina pectoris: Secondary | ICD-10-CM | POA: Diagnosis not present

## 2014-09-26 DIAGNOSIS — Q056 Thoracic spina bifida without hydrocephalus: Secondary | ICD-10-CM | POA: Insufficient documentation

## 2014-09-26 DIAGNOSIS — C8590 Non-Hodgkin lymphoma, unspecified, unspecified site: Secondary | ICD-10-CM | POA: Diagnosis not present

## 2014-09-26 DIAGNOSIS — K7689 Other specified diseases of liver: Secondary | ICD-10-CM | POA: Insufficient documentation

## 2014-09-26 DIAGNOSIS — C8510 Unspecified B-cell lymphoma, unspecified site: Secondary | ICD-10-CM | POA: Diagnosis not present

## 2014-09-26 DIAGNOSIS — K5730 Diverticulosis of large intestine without perforation or abscess without bleeding: Secondary | ICD-10-CM | POA: Insufficient documentation

## 2014-09-26 DIAGNOSIS — C799 Secondary malignant neoplasm of unspecified site: Secondary | ICD-10-CM | POA: Diagnosis not present

## 2014-09-26 DIAGNOSIS — I700 Atherosclerosis of aorta: Secondary | ICD-10-CM | POA: Insufficient documentation

## 2014-09-26 DIAGNOSIS — R911 Solitary pulmonary nodule: Secondary | ICD-10-CM | POA: Diagnosis not present

## 2014-09-26 DIAGNOSIS — K449 Diaphragmatic hernia without obstruction or gangrene: Secondary | ICD-10-CM | POA: Insufficient documentation

## 2014-09-26 LAB — COMPREHENSIVE METABOLIC PANEL (CC13)
ALBUMIN: 4.1 g/dL (ref 3.5–5.0)
ALT: 22 U/L (ref 0–55)
AST: 24 U/L (ref 5–34)
Alkaline Phosphatase: 74 U/L (ref 40–150)
Anion Gap: 8 mEq/L (ref 3–11)
BUN: 14.7 mg/dL (ref 7.0–26.0)
CALCIUM: 9.7 mg/dL (ref 8.4–10.4)
CHLORIDE: 105 meq/L (ref 98–109)
CO2: 28 mEq/L (ref 22–29)
CREATININE: 1 mg/dL (ref 0.7–1.3)
EGFR: 75 mL/min/{1.73_m2} — ABNORMAL LOW (ref >90)
GLUCOSE: 98 mg/dL (ref 70–140)
Potassium: 4.3 mEq/L (ref 3.5–5.1)
SODIUM: 141 meq/L (ref 136–145)
Total Bilirubin: 0.65 mg/dL (ref 0.20–1.20)
Total Protein: 6.7 g/dL (ref 6.4–8.3)

## 2014-09-26 LAB — CBC WITH DIFFERENTIAL/PLATELET
BASO%: 0.6 % (ref 0.0–2.0)
Basophils Absolute: 0 10*3/uL (ref 0.0–0.1)
EOS%: 5 % (ref 0.0–7.0)
Eosinophils Absolute: 0.3 10*3/uL (ref 0.0–0.5)
HCT: 44.9 % (ref 38.4–49.9)
HEMOGLOBIN: 15.7 g/dL (ref 13.0–17.1)
LYMPH%: 18.3 % (ref 14.0–49.0)
MCH: 31.5 pg (ref 27.2–33.4)
MCHC: 35 g/dL (ref 32.0–36.0)
MCV: 90 fL (ref 79.3–98.0)
MONO#: 0.9 10*3/uL (ref 0.1–0.9)
MONO%: 13.7 % (ref 0.0–14.0)
NEUT#: 4.1 10*3/uL (ref 1.5–6.5)
NEUT%: 62.4 % (ref 39.0–75.0)
Platelets: 153 10*3/uL (ref 140–400)
RBC: 4.99 10*6/uL (ref 4.20–5.82)
RDW: 12.9 % (ref 11.0–14.6)
WBC: 6.6 10*3/uL (ref 4.0–10.3)
lymph#: 1.2 10*3/uL (ref 0.9–3.3)

## 2014-09-26 LAB — LACTATE DEHYDROGENASE (CC13): LDH: 201 U/L (ref 125–245)

## 2014-09-26 MED ORDER — IOHEXOL 300 MG/ML  SOLN
100.0000 mL | Freq: Once | INTRAMUSCULAR | Status: AC | PRN
  Administered 2014-09-26: 100 mL via INTRAVENOUS

## 2014-09-27 ENCOUNTER — Telehealth: Payer: Self-pay | Admitting: Hematology and Oncology

## 2014-09-27 ENCOUNTER — Ambulatory Visit (HOSPITAL_BASED_OUTPATIENT_CLINIC_OR_DEPARTMENT_OTHER): Payer: Medicare Other | Admitting: Hematology and Oncology

## 2014-09-27 ENCOUNTER — Encounter: Payer: Self-pay | Admitting: Hematology and Oncology

## 2014-09-27 VITALS — BP 111/75 | HR 68 | Temp 97.6°F | Resp 18 | Ht 72.0 in | Wt 192.3 lb

## 2014-09-27 DIAGNOSIS — Z23 Encounter for immunization: Secondary | ICD-10-CM | POA: Diagnosis not present

## 2014-09-27 DIAGNOSIS — H538 Other visual disturbances: Secondary | ICD-10-CM | POA: Insufficient documentation

## 2014-09-27 DIAGNOSIS — Z299 Encounter for prophylactic measures, unspecified: Secondary | ICD-10-CM | POA: Insufficient documentation

## 2014-09-27 DIAGNOSIS — I2510 Atherosclerotic heart disease of native coronary artery without angina pectoris: Secondary | ICD-10-CM

## 2014-09-27 DIAGNOSIS — C8510 Unspecified B-cell lymphoma, unspecified site: Secondary | ICD-10-CM

## 2014-09-27 MED ORDER — INFLUENZA VAC SPLIT QUAD 0.5 ML IM SUSY
0.5000 mL | PREFILLED_SYRINGE | INTRAMUSCULAR | Status: AC
  Administered 2014-09-27: 0.5 mL via INTRAMUSCULAR
  Filled 2014-09-27: qty 0.5

## 2014-09-27 NOTE — Progress Notes (Signed)
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   12/12/2003 - 06/01/2004 Chemotherapy He was treated with R. CHOP chemotherapy which show complete remission. The number of cycles of R. CHOP chemotherapy was unknown.   12/19/2006 Surgery Lung resection show follicular lymphoma.   01/02/2007 - 09/01/2008 Chemotherapy The patient was treated with single agent rituximab alone.   01/19/2007 Bone Marrow Biopsy Bone marrow biopsy was negative.   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   05/08/2013 - 05/11/2013 Hospital Admission The patient was admitted to the hospital for management of pericarditis. CT scan showed extensive lymphadenopathy.   06/07/2013 Imaging PET/CT scan showed extensive lymphadenopathy   06/25/2013 Procedure He has placement of Infuse-a-Port.   06/28/2013 Bone Marrow Biopsy Bone marrow biopsy is positive for lymphoma involvement.   07/04/2013 - 11/22/2013 Chemotherapy He is treated with 6 cycles of bendamustine with rituximab.   09/24/2013 Imaging Repeat PET scan show complete remission.   12/26/2013 Imaging PEt scan showed complete remission   09/26/2014 Imaging CT scan of the chest abdomen and pelvis show no evidence of disease    INTERVAL HISTORY: Please see below for problem oriented charting. He returns for further follow-up. He  denies recent flare of diverticulitis. No new lymphadenopathy. He underwent cataract surgery recently causing complication with blurriness of vision on the right eye.  REVIEW OF SYSTEMS:   Constitutional: Denies fevers, chills or abnormal weight loss Ears, nose, mouth, throat, and face: Denies mucositis or sore throat Respiratory: Denies cough, dyspnea or wheezes Cardiovascular: Denies palpitation, chest discomfort or lower extremity swelling Gastrointestinal:  Denies nausea, heartburn or change in bowel habits Skin: Denies abnormal skin rashes Lymphatics: Denies new lymphadenopathy or easy bruising Neurological:Denies numbness, tingling or new weaknesses Behavioral/Psych: Mood is stable, no new changes  All other systems were reviewed with the patient and are negative.  I have reviewed the past medical history, past surgical history, social history and family history with the patient and they are unchanged from previous note.  ALLERGIES:  has No Known Allergies.  MEDICATIONS:  Current Outpatient Prescriptions  Medication Sig Dispense Refill  . aspirin EC 81 MG tablet Take 81 mg by mouth every morning.    Marland Kitchen atorvastatin (LIPITOR) 10 MG tablet Take 10 mg by mouth every evening.     . diltiazem (CARDIZEM CD) 120 MG 24 hr capsule Take 1 capsule (120 mg total) by mouth every morning. (Patient taking differently: Take 120 mg by mouth every evening. ) 90 capsule 3  . metoprolol succinate (TOPROL-XL) 25 MG 24 hr tablet TAKE 1 TABLET DAILY (Patient taking differently: TAKE 1 TABLET DAILY EVERY MOORNING) 90 tablet 3  . Multiple Vitamin (MULTIVITAMIN WITH MINERALS) TABS tablet Take 1 tablet by mouth every morning.     Marland Kitchen omeprazole (PRILOSEC) 20 MG capsule Take 20 mg by mouth daily.    . Probiotic Product (PROBIOTIC DAILY PO)  Take 1 capsule by mouth 3 (three) times daily.      No current facility-administered medications for this visit.    PHYSICAL EXAMINATION: ECOG PERFORMANCE STATUS: 0 -  Asymptomatic  Filed Vitals:   09/27/14 1031  BP: 111/75  Pulse: 68  Temp: 97.6 F (36.4 C)  Resp: 18   Filed Weights   09/27/14 1031  Weight: 192 lb 4.8 oz (87.227 kg)    GENERAL:alert, no distress and comfortable SKIN: skin color, texture, turgor are normal, no rashes or significant lesions EYES: normal, Conjunctiva are pink and non-injected, sclera clear OROPHARYNX:no exudate, no erythema and lips, buccal mucosa, and tongue normal  NECK: supple, thyroid normal size, non-tender, without nodularity LYMPH:  no palpable lymphadenopathy in the cervical, axillary or inguinal LUNGS: clear to auscultation and percussion with normal breathing effort HEART: regular rate & rhythm and no murmurs and no lower extremity edema ABDOMEN:abdomen soft, non-tender and normal bowel sounds Musculoskeletal:no cyanosis of digits and no clubbing  NEURO: alert & oriented x 3 with fluent speech, no focal motor/sensory deficits  LABORATORY DATA:  I have reviewed the data as listed    Component Value Date/Time   NA 141 09/26/2014 0808   NA 141 08/19/2014 0921   NA 144 12/07/2013 1447   NA 139 07/20/2011 0800   K 4.3 09/26/2014 0808   K 5.0 08/19/2014 0921   K 4.4 07/20/2011 0800   CL 101 08/19/2014 0921   CL 104 03/24/2012 1024   CL 100 07/20/2011 0800   CO2 28 09/26/2014 0808   CO2 24 08/19/2014 0921   CO2 29 07/20/2011 0800   GLUCOSE 98 09/26/2014 0808   GLUCOSE 105* 08/19/2014 0921   GLUCOSE 108* 12/07/2013 1447   GLUCOSE 80 03/24/2012 1024   GLUCOSE 100 07/20/2011 0800   BUN 14.7 09/26/2014 0808   BUN 15 08/19/2014 0921   BUN 10 12/07/2013 1447   BUN 14 07/20/2011 0800   CREATININE 1.0 09/26/2014 0808   CREATININE 1.00 08/19/2014 0921   CREATININE 1.2 07/20/2011 0800   CALCIUM 9.7 09/26/2014 0808   CALCIUM 9.2 08/19/2014 0921   CALCIUM 8.7 07/20/2011 0800   PROT 6.7 09/26/2014 0808   PROT 6.4 08/19/2014 0921   PROT 6.6 12/07/2013 1447   PROT 7.2 07/20/2011 0800   ALBUMIN 4.1  09/26/2014 0808   ALBUMIN 3.6 12/07/2013 1447   AST 24 09/26/2014 0808   AST 16 08/19/2014 0921   AST 25 07/20/2011 0800   ALT 22 09/26/2014 0808   ALT 18 08/19/2014 0921   ALT 29 07/20/2011 0800   ALKPHOS 74 09/26/2014 0808   ALKPHOS 65 08/19/2014 0921   ALKPHOS 55 07/20/2011 0800   BILITOT 0.65 09/26/2014 0808   BILITOT 0.5 08/19/2014 0921   BILITOT 0.6 01/18/2014 1054   BILITOT 1.00 07/20/2011 0800   GFRNONAA 76 08/19/2014 0921   GFRAA 88 08/19/2014 0921    No results found for: SPEP, UPEP  Lab Results  Component Value Date   WBC 6.6 09/26/2014   NEUTROABS 4.1 09/26/2014   HGB 15.7 09/26/2014   HCT 44.9 09/26/2014   MCV 90.0 09/26/2014   PLT 153 09/26/2014      Chemistry      Component Value Date/Time   NA 141 09/26/2014 0808   NA 141 08/19/2014 0921   NA 144 12/07/2013 1447   NA 139 07/20/2011 0800   K 4.3 09/26/2014 0808   K 5.0 08/19/2014 0921   K 4.4 07/20/2011 0800   CL 101  08/19/2014 0921   CL 104 03/24/2012 1024   CL 100 07/20/2011 0800   CO2 28 09/26/2014 0808   CO2 24 08/19/2014 0921   CO2 29 07/20/2011 0800   BUN 14.7 09/26/2014 0808   BUN 15 08/19/2014 0921   BUN 10 12/07/2013 1447   BUN 14 07/20/2011 0800   CREATININE 1.0 09/26/2014 0808   CREATININE 1.00 08/19/2014 0921   CREATININE 1.2 07/20/2011 0800      Component Value Date/Time   CALCIUM 9.7 09/26/2014 0808   CALCIUM 9.2 08/19/2014 0921   CALCIUM 8.7 07/20/2011 0800   ALKPHOS 74 09/26/2014 0808   ALKPHOS 65 08/19/2014 0921   ALKPHOS 55 07/20/2011 0800   AST 24 09/26/2014 0808   AST 16 08/19/2014 0921   AST 25 07/20/2011 0800   ALT 22 09/26/2014 0808   ALT 18 08/19/2014 0921   ALT 29 07/20/2011 0800   BILITOT 0.65 09/26/2014 0808   BILITOT 0.5 08/19/2014 0921   BILITOT 0.6 01/18/2014 1054   BILITOT 1.00 07/20/2011 0800       RADIOGRAPHIC STUDIES: I reviewed all imaging study with the patient I have personally reviewed the radiological images as listed and agreed with the  findings in the report. Ct Chest W Contrast  09/26/2014   CLINICAL DATA:  Non-Hodgkin's lymphoma diagnosed 2002. Metastasis diagnosed 2009. History RIGHT lower lobectomy in the groin biopsy. Chemotherapy completed. Subsequent treatment evaluation.  EXAM: CT CHEST, ABDOMEN, AND PELVIS WITH CONTRAST  TECHNIQUE: Multidetector CT imaging of the chest, abdomen and pelvis was performed following the standard protocol during bolus administration of intravenous contrast.  CONTRAST:  149m OMNIPAQUE IOHEXOL 300 MG/ML  SOLN  COMPARISON:  PET-CT 12/27/2013  FINDINGS: CT CHEST FINDINGS  Mediastinum/Nodes: No axillary or supraclavicular lymphadenopathy. No mediastinal hilar adenopathy insert pulmonary embolism. Pericardial fluid. Coronary calcifications are noted. Large hiatal hernia is.  Lungs/Pleura: 5 mm noncalcified pulmonary nodule in the LEFT lung wound is not clearly seen on prior (image 41, series). Postsurgical change in the RIGHT lower lobe without nodularity.  CT ABDOMEN AND PELVIS FINDINGS  Hepatobiliary: Simple fluid attenuation cyst in the LEFT hepatic lobe. Mild intrahepatic duct dilatation following cholecystectomy is similar prior.  Pancreas: Pancreas is normal. No ductal dilatation. No pancreatic inflammation.  Spleen: Normal spleen  Adrenals/urinary tract: Adrenal glands and kidneys are normal. Simple fluid attenuation cyst the LEFT kidney noted The ureters and bladder normal.  Stomach/Bowel: Large hiatal hernia. Duodenum, small bowel, and cecum are normal. Moderate volume stool throughout colon. Diverticula the descending colon and sigmoid colon without acute inflammation  Vascular/Lymphatic: Abdominal aorta is normal caliber with atherosclerotic calcification. There is no retroperitoneal or periportal lymphadenopathy. No pelvic lymphadenopathy.  There is mild haziness in the retroperitoneal fat along the celiac trunk. Lymph node described on comparison exam measures 7 mm LEFT of the celiac trunk not  changed from prior. No iliac or inguinal adenopathy.  Reproductive: Prostate normal.  Musculoskeletal: No aggressive osseous lesion. Simple fluid attenuation rounded lesion adjacent to the RIGHT T10 neural foramina (image 47, series 2) is most consistent with a lateral meningocele. This is not changed prior. Smaller meningocele 1 level lower.  Other: No free fluid.  IMPRESSION: Chest Impression:  1. No evidence of lymphoma recurrence in thorax. 2. LEFT lower lobe pulmonary nodule not seen on prior. Favor benign etiology. Recommend attention on follow-up.  Abdomen / Pelvis Impression:  1. No evidence of lymphoma recurrence of the abdomen or pelvis. 2. Sigmoid diverticulosis without diverticulitis. 3. Atherosclerotic calcification aorta.  4. Hiatal hernia. 5. Small Lateral meningoceles  along the lower thoracic spine   Electronically Signed   By: Suzy Bouchard M.D.   On: 09/26/2014 10:20   Ct Abdomen Pelvis W Contrast  09/26/2014   CLINICAL DATA:  Non-Hodgkin's lymphoma diagnosed 2002. Metastasis diagnosed 2009. History RIGHT lower lobectomy in the groin biopsy. Chemotherapy completed. Subsequent treatment evaluation.  EXAM: CT CHEST, ABDOMEN, AND PELVIS WITH CONTRAST  TECHNIQUE: Multidetector CT imaging of the chest, abdomen and pelvis was performed following the standard protocol during bolus administration of intravenous contrast.  CONTRAST:  151m OMNIPAQUE IOHEXOL 300 MG/ML  SOLN  COMPARISON:  PET-CT 12/27/2013  FINDINGS: CT CHEST FINDINGS  Mediastinum/Nodes: No axillary or supraclavicular lymphadenopathy. No mediastinal hilar adenopathy insert pulmonary embolism. Pericardial fluid. Coronary calcifications are noted. Large hiatal hernia is.  Lungs/Pleura: 5 mm noncalcified pulmonary nodule in the LEFT lung wound is not clearly seen on prior (image 41, series). Postsurgical change in the RIGHT lower lobe without nodularity.  CT ABDOMEN AND PELVIS FINDINGS  Hepatobiliary: Simple fluid attenuation cyst in the  LEFT hepatic lobe. Mild intrahepatic duct dilatation following cholecystectomy is similar prior.  Pancreas: Pancreas is normal. No ductal dilatation. No pancreatic inflammation.  Spleen: Normal spleen  Adrenals/urinary tract: Adrenal glands and kidneys are normal. Simple fluid attenuation cyst the LEFT kidney noted The ureters and bladder normal.  Stomach/Bowel: Large hiatal hernia. Duodenum, small bowel, and cecum are normal. Moderate volume stool throughout colon. Diverticula the descending colon and sigmoid colon without acute inflammation  Vascular/Lymphatic: Abdominal aorta is normal caliber with atherosclerotic calcification. There is no retroperitoneal or periportal lymphadenopathy. No pelvic lymphadenopathy.  There is mild haziness in the retroperitoneal fat along the celiac trunk. Lymph node described on comparison exam measures 7 mm LEFT of the celiac trunk not changed from prior. No iliac or inguinal adenopathy.  Reproductive: Prostate normal.  Musculoskeletal: No aggressive osseous lesion. Simple fluid attenuation rounded lesion adjacent to the RIGHT T10 neural foramina (image 47, series 2) is most consistent with a lateral meningocele. This is not changed prior. Smaller meningocele 1 level lower.  Other: No free fluid.  IMPRESSION: Chest Impression:  1. No evidence of lymphoma recurrence in thorax. 2. LEFT lower lobe pulmonary nodule not seen on prior. Favor benign etiology. Recommend attention on follow-up.  Abdomen / Pelvis Impression:  1. No evidence of lymphoma recurrence of the abdomen or pelvis. 2. Sigmoid diverticulosis without diverticulitis. 3. Atherosclerotic calcification aorta. 4. Hiatal hernia. 5. Small Lateral meningoceles  along the lower thoracic spine   Electronically Signed   By: SSuzy BouchardM.D.   On: 09/26/2014 10:20     ASSESSMENT & PLAN:  Low grade B-cell lymphoma He had complete response to Rx.  The patient would be high risk of relapse. I will proceed to order blood  work, CT scan of the chest, abdomen and pelvis before see him back in 6 months to exclude disease recurrence.    Preventive measure We discussed the importance of preventive care and reviewed the vaccination programs. He does not have any prior allergic reactions to influenza vaccination. He agrees to proceed with influenza vaccination today and we will administer it today at the clinic.   Blurred vision, right eye He had blurriness of vision after recent cataract surgery. He is scheduled to return to an ophthalmologist for further evaluation and management.   Orders Placed This Encounter  Procedures  . CT Chest W Contrast    Standing Status: Future  Number of Occurrences:      Standing Expiration Date: 12/28/2015    Order Specific Question:  Reason for Exam (SYMPTOM  OR DIAGNOSIS REQUIRED)    Answer:  staging lymphoma, exclude recurrence    Order Specific Question:  Preferred imaging location?    Answer:  [facility]  . CT Abdomen Pelvis W Contrast    Standing Status: Future     Number of Occurrences:      Standing Expiration Date: 12/28/2015    Order Specific Question:  Reason for Exam (SYMPTOM  OR DIAGNOSIS REQUIRED)    Answer:  staging lymphoma, exclude recurrence    Order Specific Question:  Preferred imaging location?    Answer:  [facility]  . Lactate dehydrogenase    Standing Status: Future     Number of Occurrences:      Standing Expiration Date: 12/28/2015   All questions were answered. The patient knows to call the clinic with any problems, questions or concerns. No barriers to learning was detected. I spent 20 minutes counseling the patient face to face. The total time spent in the appointment was 25 minutes and more than 50% was on counseling and review of test results     [place], Heath, MD 09/27/2014 3:15 PM

## 2014-09-27 NOTE — Assessment & Plan Note (Signed)
He had complete response to Rx.  The patient would be high risk of relapse. I will proceed to order blood work, CT scan of the chest, abdomen and pelvis before see him back in 6 months to exclude disease recurrence.

## 2014-09-27 NOTE — Telephone Encounter (Signed)
perpof to sch pt appt-gave avs-adv [facility] will call to sch scan gave contrast

## 2014-09-27 NOTE — Assessment & Plan Note (Signed)
He had blurriness of vision after recent cataract surgery. He is scheduled to return to an ophthalmologist for further evaluation and management.

## 2014-09-27 NOTE — Assessment & Plan Note (Signed)
We discussed the importance of preventive care and reviewed the vaccination programs. He does not have any prior allergic reactions to influenza vaccination. He agrees to proceed with influenza vaccination today and we will administer it today at the clinic.  

## 2014-10-17 ENCOUNTER — Ambulatory Visit (INDEPENDENT_AMBULATORY_CARE_PROVIDER_SITE_OTHER): Payer: Medicare Other | Admitting: Family Medicine

## 2014-10-17 ENCOUNTER — Encounter: Payer: Self-pay | Admitting: Family Medicine

## 2014-10-17 VITALS — BP 122/79 | HR 65 | Temp 97.2°F | Ht 73.83 in | Wt 191.6 lb

## 2014-10-17 DIAGNOSIS — I2510 Atherosclerotic heart disease of native coronary artery without angina pectoris: Secondary | ICD-10-CM

## 2014-10-17 DIAGNOSIS — N400 Enlarged prostate without lower urinary tract symptoms: Secondary | ICD-10-CM | POA: Diagnosis not present

## 2014-10-17 MED ORDER — TAMSULOSIN HCL 0.4 MG PO CAPS: 0.4000 mg | ORAL_CAPSULE | Freq: Every day | ORAL | Status: DC

## 2014-10-17 NOTE — Progress Notes (Signed)
   HPI  Patient presents today for routine follow-up and BPH complaints.  Patient's plans for several months he's had 3 episodes of nocturia and weak stream. He was previously treated with a TUNA procedure and had good results from that. He's never tried Flomax.  He is compliant with his cholesterol medications. He has A. fib and has occasional palpitations but overall feels very well controlled.  He is generally active and jogs about 3 miles a day several times a week, however over the last month or 2 he is been less active. He watches his primary foods except for some indiscretion with eating out, particularly [place] food  PMH: Smoking status noted ROS: Per HPI  Objective: BP 122/79 mmHg  Pulse 65  Temp(Src) 97.2 F (36.2 C) (Oral)  Ht 6' 1.83" (1.875 m)  Wt 191 lb 9.6 oz (86.909 kg)  BMI 24.72 kg/m2 Gen: NAD, alert, cooperative with exam HEENT: NCAT CV: RRR, good S1/S2, no murmur Resp: CTABL, no wheezes, non-labored Ext: No edema, warm Neuro: Alert and oriented, No gross deficits DRE: Normal rectal tone, prostate large, soft, no nodules and nontender  Assessment and plan:  # BPH Start Flomax DRE reassuring PSA  # Healthcare maintenance Flu shot up-to-date, discussed diet and exercise  # A. fib Rate controlled on diltiazem and metoprolol Chads 2 VAsc score = 1, on daily ASA  # Hyperlipidemia Discussed diet and exercise, continue current Lipitor Recent lipid panel in August had an LDL of 43    Orders Placed This Encounter  Procedures  . PSA    Meds ordered this encounter  Medications  . tamsulosin (FLOMAX) 0.4 MG CAPS capsule    Sig: Take 1 capsule (0.4 mg total) by mouth daily.    Dispense:  30 capsule    Refill:  [place], MD [facility] 10/17/2014, 1:13 PM

## 2014-10-17 NOTE — Patient Instructions (Signed)
Great to meet you!  Lets follow up in 2-3 months for BPH, we can take more steps to treat your BPH if needed  Benign Prostatic Hyperplasia An enlarged prostate (benign prostatic hyperplasia) is common in older men. You may experience the following:  Weak urine stream.  Dribbling.  Feeling like the bladder has not emptied completely.  Difficulty starting urination.  Getting up frequently at night to urinate.  Urinating more frequently during the day. HOME CARE INSTRUCTIONS  Monitor your prostatic hyperplasia for any changes. The following actions may help to alleviate any discomfort you are experiencing:  Give yourself time when you urinate.  Stay away from alcohol.  Avoid beverages containing caffeine, such as coffee, tea, and colas, because they can make the problem worse.  Avoid decongestants, antihistamines, and some prescription medicines that can make the problem worse.  Follow up with your health care provider for further treatment as recommended. SEEK MEDICAL CARE IF:  You are experiencing progressive difficulty voiding.  Your urine stream is progressively getting narrower.  You are awaking from sleep with the urge to void more frequently.  You are constantly feeling the need to void.  You experience loss of urine, especially in small amounts. SEEK IMMEDIATE MEDICAL CARE IF:   You develop increased pain with urination or are unable to urinate.  You develop severe abdominal pain, vomiting, a high fever, or fainting.  You develop back pain or blood in your urine. MAKE SURE YOU:   Understand these instructions.  Will watch your condition.  Will get help right away if you are not doing well or get worse.   This information is not intended to replace advice given to you by your health care provider. Make sure you discuss any questions you have with your health care provider.   Document Released: 12/28/2004 Document Revised: 01/18/2014 Document Reviewed:  05/30/2012 Elsevier Interactive Patient Education [employer].

## 2014-10-18 LAB — PSA: Prostate Specific Ag, Serum: 0.6 ng/mL (ref 0.0–4.0)

## 2014-10-29 ENCOUNTER — Encounter (INDEPENDENT_AMBULATORY_CARE_PROVIDER_SITE_OTHER): Payer: Medicare Other | Admitting: Ophthalmology

## 2014-10-29 DIAGNOSIS — H26491 Other secondary cataract, right eye: Secondary | ICD-10-CM | POA: Diagnosis not present

## 2014-10-29 DIAGNOSIS — H43813 Vitreous degeneration, bilateral: Secondary | ICD-10-CM

## 2014-10-29 DIAGNOSIS — H33303 Unspecified retinal break, bilateral: Secondary | ICD-10-CM | POA: Diagnosis not present

## 2014-10-29 DIAGNOSIS — H353121 Nonexudative age-related macular degeneration, left eye, early dry stage: Secondary | ICD-10-CM

## 2014-11-15 ENCOUNTER — Ambulatory Visit (INDEPENDENT_AMBULATORY_CARE_PROVIDER_SITE_OTHER): Payer: Medicare Other | Admitting: Ophthalmology

## 2014-11-15 DIAGNOSIS — H2701 Aphakia, right eye: Secondary | ICD-10-CM

## 2014-11-21 DIAGNOSIS — H353131 Nonexudative age-related macular degeneration, bilateral, early dry stage: Secondary | ICD-10-CM | POA: Diagnosis not present

## 2014-11-28 ENCOUNTER — Other Ambulatory Visit: Payer: Self-pay | Admitting: Cardiovascular Disease

## 2014-11-28 DIAGNOSIS — J019 Acute sinusitis, unspecified: Secondary | ICD-10-CM | POA: Diagnosis not present

## 2014-11-28 DIAGNOSIS — H6092 Unspecified otitis externa, left ear: Secondary | ICD-10-CM | POA: Diagnosis not present

## 2014-12-09 ENCOUNTER — Other Ambulatory Visit: Payer: Self-pay | Admitting: *Deleted

## 2014-12-09 MED ORDER — ATORVASTATIN CALCIUM 10 MG PO TABS: 10.0000 mg | ORAL_TABLET | Freq: Every evening | ORAL | Status: DC

## 2014-12-17 ENCOUNTER — Telehealth: Payer: Self-pay | Admitting: *Deleted

## 2014-12-17 NOTE — Telephone Encounter (Signed)
Wife states pt has Cough, Congestion, Sneezing and H/A started yesterday.  No fever.  She thinks Dr. Alvy Bimler instructed them to call her first if he ever gets sick.  Informed wife Dr. Alvy Bimler out of office this afternoon.  Suggested he go to his PCP for these symptoms.  I will let Dr. Alvy Bimler know tomorrow and call them back after I speak w/ her, but do go to PCP if he is able to get appointment.  She verbalized understanding.  Will call back tomorrow.

## 2014-12-18 ENCOUNTER — Encounter: Payer: Self-pay | Admitting: Family Medicine

## 2014-12-18 ENCOUNTER — Telehealth: Payer: Self-pay | Admitting: *Deleted

## 2014-12-18 ENCOUNTER — Ambulatory Visit (INDEPENDENT_AMBULATORY_CARE_PROVIDER_SITE_OTHER): Payer: Medicare Other | Admitting: Family Medicine

## 2014-12-18 VITALS — BP 113/70 | HR 86 | Temp 98.4°F | Ht 73.5 in | Wt 198.4 lb

## 2014-12-18 DIAGNOSIS — I2510 Atherosclerotic heart disease of native coronary artery without angina pectoris: Secondary | ICD-10-CM | POA: Diagnosis not present

## 2014-12-18 DIAGNOSIS — B349 Viral infection, unspecified: Secondary | ICD-10-CM

## 2014-12-18 DIAGNOSIS — N400 Enlarged prostate without lower urinary tract symptoms: Secondary | ICD-10-CM | POA: Diagnosis not present

## 2014-12-18 MED ORDER — AZITHROMYCIN 250 MG PO TABS: ORAL_TABLET | ORAL | Status: DC

## 2014-12-18 MED ORDER — TAMSULOSIN HCL 0.4 MG PO CAPS: 0.8000 mg | ORAL_CAPSULE | Freq: Every day | ORAL | Status: DC

## 2014-12-18 MED ORDER — TRIAMCINOLONE ACETONIDE 40 MG/ML IJ SUSP: 60.0000 mg | Freq: Once | INTRAMUSCULAR | Status: DC

## 2014-12-18 MED ORDER — TRIAMCINOLONE ACETONIDE 40 MG/ML IJ SUSP
40.0000 mg | Freq: Once | INTRAMUSCULAR | Status: AC
  Administered 2014-12-18: 40 mg via INTRAMUSCULAR

## 2014-12-18 NOTE — Progress Notes (Signed)
   HPI  Patient presents today here to be evaluated for an acute illness.  Patient's lines of the last 3 days he's had headache, frequent sneezing, and subjective fever.  He has a significant vascular history of B-cell lymphoma. He still eating and drinking normally, he has very mild dyspnea, he denies chest pain He's been using Tylenol over-the-counter for headaches which is helping a lot.  Prostate He is a history of BPH status post TUNA procedure He started Flomax previously with not much improvement of symptoms. He has been in complete emptying, frequency, 2 times nocturia on average night, urgency, weak stream. His IPSS score is 28   PMH: Smoking status noted ROS: Per HPI  Objective: BP 113/70 mmHg  Pulse 86  Temp(Src) 98.4 F (36.9 C) (Oral)  Ht 6' 1.5" (1.867 m)  Wt 198 lb 6.4 oz (89.994 kg)  BMI 25.82 kg/m2 Gen: NAD, alert, cooperative with exam HEENT: NCAT, nares clear, TMs normal bilaterally, oropharynx clear CV: RRR, good S1/S2, no murmur Resp: CTABL, no wheezes, non-labored Ext: No edema, warm Neuro: Alert and oriented, No gross deficits  Assessment and plan:  # Viral illness, acute bronchitis Believe that he has a viral illness with his current symptoms He is on day 3 of symptoms and this does not sound influenza-like, he is outside of the treatment window for Tamiflu so I did not check for that. Considering lymphoma I will be more aggressive than usual and go ahead and cover him with azithromycin Also given him a shot of Kenalog for symptomatic relief Discussed Neti Pot for Flonase  # BPH Mild improvement with Flomax, we discussed increasing Flomax to 0.8 mg versus starting Proscar, he would like to increase Flomax at this time    Meds ordered this encounter  Medications  . DISCONTD: triamcinolone acetonide (KENALOG-40) injection 60 mg    Sig:     Laroy Apple, MD [facility] 12/18/2014, 9:09 AM

## 2014-12-18 NOTE — Patient Instructions (Addendum)
Great to see you!  You have what looks ike a viral illness, you should begin feeling better within 1 week.   If you get worse or do not get better as expected please come back  Viral Infections A virus is a type of germ. Viruses can cause:  Minor sore throats.  Aches and pains.  Headaches.  Runny nose.  Rashes.  Watery eyes.  Tiredness.  Coughs.  Loss of appetite.  Feeling sick to your stomach (nausea).  Throwing up (vomiting).  Watery poop (diarrhea). HOME CARE   Only take medicines as told by your doctor.  Drink enough water and fluids to keep your pee (urine) clear or pale yellow. Sports drinks are a good choice.  Get plenty of rest and eat healthy. Soups and broths with crackers or rice are fine. GET HELP RIGHT AWAY IF:   You have a very bad headache.  You have shortness of breath.  You have chest pain or neck pain.  You have an unusual rash.  You cannot stop throwing up.  You have watery poop that does not stop.  You cannot keep fluids down.  You or your child has a temperature by mouth above 102 F (38.9 C), not controlled by medicine.  Your baby is older than 3 months with a rectal temperature of 102 F (38.9 C) or higher.  Your baby is 67 months old or younger with a rectal temperature of 100.4 F (38 C) or higher. MAKE SURE YOU:   Understand these instructions.  Will watch this condition.  Will get help right away if you are not doing well or get worse.   This information is not intended to replace advice given to you by your health care provider. Make sure you discuss any questions you have with your health care provider.   Document Released: 12/11/2007 Document Revised: 03/22/2011 Document Reviewed: 06/05/2014 Elsevier Interactive Patient Education [employer].

## 2014-12-18 NOTE — Telephone Encounter (Signed)
LVM on both cell phone numbers informing pt that Dr. Alvy Bimler can see him today 10:45 am for lab/  11:15 am for Dr. Alvy Bimler,  If he has not already seen his PCP or Urgent Care for his cold symptoms. Please call back to confirm if he wants to be seen by Dr. Alvy Bimler today.

## 2014-12-18 NOTE — Telephone Encounter (Signed)
Did he get in to see PCP? If not I can see him today with labs at 1115

## 2014-12-18 NOTE — Telephone Encounter (Signed)
Wife left VM states pt saw his PCP this morning and everything is ok.  Thanks for offering to see pt today.

## 2014-12-18 NOTE — Addendum Note (Signed)
Addended by: Shelbie Ammons on: 12/18/2014 12:11 PM   Modules accepted: Orders

## 2014-12-19 ENCOUNTER — Encounter: Payer: Self-pay | Admitting: Family Medicine

## 2015-01-06 ENCOUNTER — Other Ambulatory Visit: Payer: Self-pay | Admitting: Cardiovascular Disease

## 2015-01-14 ENCOUNTER — Ambulatory Visit (HOSPITAL_BASED_OUTPATIENT_CLINIC_OR_DEPARTMENT_OTHER): Payer: Medicare Other | Admitting: Hematology and Oncology

## 2015-01-14 ENCOUNTER — Other Ambulatory Visit: Payer: Self-pay | Admitting: Hematology and Oncology

## 2015-01-14 ENCOUNTER — Encounter: Payer: Self-pay | Admitting: Hematology and Oncology

## 2015-01-14 ENCOUNTER — Telehealth: Payer: Self-pay | Admitting: *Deleted

## 2015-01-14 ENCOUNTER — Telehealth: Payer: Self-pay | Admitting: Hematology and Oncology

## 2015-01-14 ENCOUNTER — Ambulatory Visit (HOSPITAL_BASED_OUTPATIENT_CLINIC_OR_DEPARTMENT_OTHER): Payer: Medicare Other

## 2015-01-14 VITALS — BP 110/72 | HR 66 | Temp 98.0°F | Resp 18 | Ht 73.5 in | Wt 194.5 lb

## 2015-01-14 DIAGNOSIS — R21 Rash and other nonspecific skin eruption: Secondary | ICD-10-CM | POA: Insufficient documentation

## 2015-01-14 DIAGNOSIS — L270 Generalized skin eruption due to drugs and medicaments taken internally: Secondary | ICD-10-CM | POA: Insufficient documentation

## 2015-01-14 DIAGNOSIS — C8510 Unspecified B-cell lymphoma, unspecified site: Secondary | ICD-10-CM

## 2015-01-14 LAB — CBC WITH DIFFERENTIAL/PLATELET
BASO%: 1.1 % (ref 0.0–2.0)
BASOS ABS: 0.1 10*3/uL (ref 0.0–0.1)
EOS ABS: 0.2 10*3/uL (ref 0.0–0.5)
EOS%: 2.8 % (ref 0.0–7.0)
HEMATOCRIT: 47.9 % (ref 38.4–49.9)
HEMOGLOBIN: 15.9 g/dL (ref 13.0–17.1)
LYMPH#: 1.4 10*3/uL (ref 0.9–3.3)
LYMPH%: 20.3 % (ref 14.0–49.0)
MCH: 30.4 pg (ref 27.2–33.4)
MCHC: 33.2 g/dL (ref 32.0–36.0)
MCV: 91.7 fL (ref 79.3–98.0)
MONO#: 0.6 10*3/uL (ref 0.1–0.9)
MONO%: 9.5 % (ref 0.0–14.0)
NEUT#: 4.5 10*3/uL (ref 1.5–6.5)
NEUT%: 66.3 % (ref 39.0–75.0)
PLATELETS: 139 10*3/uL — AB (ref 140–400)
RBC: 5.22 10*6/uL (ref 4.20–5.82)
RDW: 13.1 % (ref 11.0–14.6)
WBC: 6.7 10*3/uL (ref 4.0–10.3)

## 2015-01-14 LAB — COMPREHENSIVE METABOLIC PANEL
ALBUMIN: 4.1 g/dL (ref 3.5–5.0)
ALK PHOS: 58 U/L (ref 40–150)
ALT: 29 U/L (ref 0–55)
AST: 23 U/L (ref 5–34)
Anion Gap: 8 mEq/L (ref 3–11)
BILIRUBIN TOTAL: 0.94 mg/dL (ref 0.20–1.20)
BUN: 14.3 mg/dL (ref 7.0–26.0)
CALCIUM: 9.3 mg/dL (ref 8.4–10.4)
CHLORIDE: 104 meq/L (ref 98–109)
CO2: 29 mEq/L (ref 22–29)
CREATININE: 1.1 mg/dL (ref 0.7–1.3)
EGFR: 68 mL/min/{1.73_m2} — ABNORMAL LOW (ref >90)
Glucose: 84 mg/dl (ref 70–140)
Potassium: 4.3 mEq/L (ref 3.5–5.1)
Sodium: 141 mEq/L (ref 136–145)
TOTAL PROTEIN: 6.8 g/dL (ref 6.4–8.3)

## 2015-01-14 NOTE — Progress Notes (Signed)
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   12/12/2003 - 06/01/2004 Chemotherapy He was treated with R. CHOP chemotherapy which show complete remission. The number of cycles of R. CHOP chemotherapy was unknown.   12/19/2006 Surgery Lung resection show follicular lymphoma.   01/02/2007 - 09/01/2008 Chemotherapy The patient was treated with single agent rituximab alone.   01/19/2007 Bone Marrow Biopsy Bone marrow biopsy was negative.   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   05/08/2013 - 05/11/2013 Hospital Admission The patient was admitted to the hospital for management of pericarditis. CT scan showed extensive lymphadenopathy.   06/07/2013 Imaging PET/CT scan showed extensive lymphadenopathy   06/25/2013 Procedure He has placement of Infuse-a-Port.   06/28/2013 Bone Marrow Biopsy Bone marrow biopsy is positive for lymphoma involvement.   07/04/2013 - 11/22/2013 Chemotherapy He is treated with 6 cycles of bendamustine with rituximab.   09/24/2013 Imaging Repeat PET scan show complete remission.   12/26/2013 Imaging PEt scan showed complete remission   09/26/2014 Imaging CT scan of the chest abdomen and pelvis show no evidence of disease    INTERVAL HISTORY: Please see below for problem oriented charting.  he is seen urgently today because of  acute onset of rash. Around 12/18/2014, the patient was prescribed Z-Pak for infection. This rash appears on both knees and his elbows and extensor surface. It the rash has been present for about 4 days and is very itchy. He denies rash elsewhere. He denies new medication, recent environmental exposure or new medications.  Denies new lymphadenopathy.  REVIEW OF SYSTEMS:   Constitutional: Denies fevers, chills or abnormal weight loss Eyes: Denies blurriness of vision Ears, nose, mouth, throat, and face: Denies mucositis or sore throat Respiratory: Denies cough, dyspnea or wheezes Cardiovascular: Denies palpitation, chest discomfort or lower extremity swelling Gastrointestinal:  Denies nausea, heartburn or change in bowel habits Lymphatics: Denies new lymphadenopathy or easy bruising Neurological:Denies numbness, tingling or new weaknesses Behavioral/Psych: Mood is stable, no new changes  All other systems were reviewed with the patient and are negative.  I have reviewed the past medical history, past surgical history, social history and family history with the patient and they are unchanged from previous note.  ALLERGIES:  has No Known Allergies.  MEDICATIONS:  Current Outpatient Prescriptions  Medication Sig Dispense Refill  . aspirin EC 81 MG tablet Take 81 mg by mouth every morning.    Marland Kitchen atorvastatin (LIPITOR) 10 MG tablet Take 1 tablet (10 mg total) by mouth every evening. 90 tablet 3  . azithromycin (ZITHROMAX Z-PAK) 250 MG tablet 2 pills on day 1 and 1 pill daily after that 6 tablet 0  . diltiazem (CARDIZEM CD) 120 MG 24 hr capsule TAKE 1 CAPSULE EVERY MORNING 90 capsule 2  . Multiple Vitamin (MULTIVITAMIN WITH MINERALS) TABS tablet Take 1 tablet by mouth every morning.     Marland Kitchen  omeprazole (PRILOSEC) 20 MG capsule Take 20 mg by mouth daily.    . Probiotic Product (PROBIOTIC DAILY PO) Take 1 capsule by mouth 3 (three) times daily.     . tamsulosin (FLOMAX) 0.4 MG CAPS capsule Take  2 capsules (0.8 mg total) by mouth daily after supper. 60 capsule 11  . TOPROL XL 25 MG 24 hr tablet TAKE 1 TABLET DAILY 90 tablet 0   No current facility-administered medications for this visit.    PHYSICAL EXAMINATION: ECOG PERFORMANCE STATUS: 0 - Asymptomatic  Filed Vitals:   01/14/15 1315  BP: 110/72  Pulse: 66  Temp: 98 F (36.7 C)  Resp: 18   Filed Weights   01/14/15 1315  Weight: 194 lb 8 oz (88.225 kg)    GENERAL:alert, no distress and comfortable SKIN:  He had a rash on both elbow area and bilateral knees on the extensor surfaces.  No skin blisters are noted. EYES: normal, Conjunctiva are pink and non-injected, sclera clear OROPHARYNX:no exudate, no erythema and lips, buccal mucosa, and tongue normal  NECK: supple, thyroid normal size, non-tender, without nodularity LYMPH:  no palpable lymphadenopathy in the cervical, axillary or inguinal LUNGS: clear to auscultation and percussion with normal breathing effort HEART: regular rate & rhythm and no murmurs and no lower extremity edema ABDOMEN:abdomen soft, non-tender and normal bowel sounds Musculoskeletal:no cyanosis of digits and no clubbing  NEURO: alert & oriented x 3 with fluent speech, no focal motor/sensory deficits  LABORATORY DATA:  I have reviewed the data as listed    Component Value Date/Time   NA 141 01/14/2015 1252   NA 141 08/19/2014 0921   NA 144 12/07/2013 1447   NA 139 07/20/2011 0800   K 4.3 01/14/2015 1252   K 5.0 08/19/2014 0921   K 4.4 07/20/2011 0800   CL 101 08/19/2014 0921   CL 104 03/24/2012 1024   CL 100 07/20/2011 0800   CO2 29 01/14/2015 1252   CO2 24 08/19/2014 0921   CO2 29 07/20/2011 0800   GLUCOSE 84 01/14/2015 1252   GLUCOSE 105* 08/19/2014 0921   GLUCOSE 108* 12/07/2013 1447   GLUCOSE 80 03/24/2012 1024   GLUCOSE 100 07/20/2011 0800   BUN 14.3 01/14/2015 1252   BUN 15 08/19/2014 0921   BUN 10 12/07/2013 1447   BUN 14 07/20/2011 0800   CREATININE 1.1 01/14/2015 1252    CREATININE 1.00 08/19/2014 0921   CREATININE 1.2 07/20/2011 0800   CALCIUM 9.3 01/14/2015 1252   CALCIUM 9.2 08/19/2014 0921   CALCIUM 8.7 07/20/2011 0800   PROT 6.8 01/14/2015 1252   PROT 6.4 08/19/2014 0921   PROT 6.6 12/07/2013 1447   PROT 7.2 07/20/2011 0800   ALBUMIN 4.1 01/14/2015 1252   ALBUMIN 4.2 08/19/2014 0921   ALBUMIN 3.6 12/07/2013 1447   AST 23 01/14/2015 1252   AST 16 08/19/2014 0921   AST 25 07/20/2011 0800   ALT 29 01/14/2015 1252   ALT 18 08/19/2014 0921   ALT 29 07/20/2011 0800   ALKPHOS 58 01/14/2015 1252   ALKPHOS 65 08/19/2014 0921   ALKPHOS 55 07/20/2011 0800   BILITOT 0.94 01/14/2015 1252   BILITOT 0.5 08/19/2014 0921   BILITOT 0.6 01/18/2014 1054   BILITOT 1.00 07/20/2011 0800   GFRNONAA 76 08/19/2014 0921   GFRAA 88 08/19/2014 0921    No results found for: SPEP, UPEP  Lab Results  Component Value Date   WBC 6.7 01/14/2015   NEUTROABS 4.5 01/14/2015   HGB 15.9 01/14/2015  HCT 47.9 01/14/2015   MCV 91.7 01/14/2015   PLT 139* 01/14/2015      Chemistry      Component Value Date/Time   NA 141 01/14/2015 1252   NA 141 08/19/2014 0921   NA 144 12/07/2013 1447   NA 139 07/20/2011 0800   K 4.3 01/14/2015 1252   K 5.0 08/19/2014 0921   K 4.4 07/20/2011 0800   CL 101 08/19/2014 0921   CL 104 03/24/2012 1024   CL 100 07/20/2011 0800   CO2 29 01/14/2015 1252   CO2 24 08/19/2014 0921   CO2 29 07/20/2011 0800   BUN 14.3 01/14/2015 1252   BUN 15 08/19/2014 0921   BUN 10 12/07/2013 1447   BUN 14 07/20/2011 0800   CREATININE 1.1 01/14/2015 1252   CREATININE 1.00 08/19/2014 0921   CREATININE 1.2 07/20/2011 0800      Component Value Date/Time   CALCIUM 9.3 01/14/2015 1252   CALCIUM 9.2 08/19/2014 0921   CALCIUM 8.7 07/20/2011 0800   ALKPHOS 58 01/14/2015 1252   ALKPHOS 65 08/19/2014 0921   ALKPHOS 55 07/20/2011 0800   AST 23 01/14/2015 1252   AST 16 08/19/2014 0921   AST 25 07/20/2011 0800   ALT 29 01/14/2015 1252   ALT 18  08/19/2014 0921   ALT 29 07/20/2011 0800   BILITOT 0.94 01/14/2015 1252   BILITOT 0.5 08/19/2014 0921   BILITOT 0.6 01/18/2014 1054   BILITOT 1.00 07/20/2011 0800      ASSESSMENT & PLAN:  Low grade B-cell lymphoma  Clinically, I detected no signs or symptoms to suggest recurrence of lymphoma. I will see him again in 2 months as previously scheduled.  Skin rash  He has had onset of acute skin eruption which is incredibly itchy but denies any recent new medications, environmental exposure or new soaps.  The presentation is highly suspicious for dermatitis herpetiformis but it would be highly unusual in this situation.  Contact dermatitis cannot be ruled out. The patient has a dermatologist and recommend he makes an appointment. In the meantime, I recommend conservative measure with topical emollient cream, Benadryl and topical 1% hydrocortisone over-the-counter. If he is not able to see his dermatologist in the near future and if the rash gets worse, I will prescribed oral prednisone.   No orders of the defined types were placed in this encounter.   All questions were answered. The patient knows to call the clinic with any problems, questions or concerns. No barriers to learning was detected. I spent 15 minutes counseling the patient face to face. The total time spent in the appointment was 20 minutes and more than 50% was on counseling and review of test results     [facility], [place], MD 01/14/2015 1:44 PM

## 2015-01-14 NOTE — Telephone Encounter (Signed)
Wife called concerned that patient has a rash that seems to be spreading.  It started 2-3 days ago behind his knees and now has spread to the back of his legs.  He has no fever, pain or swelling.  It just itches.  It looks like red, raised bites?Marland Kitchen  They were wondering if Dr. Alvy Bimler wants them to come in for lab work?   Let them know I would send a note to Dr. Alvy Bimler and her nurse.  Call back for patient is [phone].

## 2015-01-14 NOTE — Assessment & Plan Note (Signed)
Clinically, I detected no signs or symptoms to suggest recurrence of lymphoma. I will see him again in 2 months as previously scheduled.

## 2015-01-14 NOTE — Assessment & Plan Note (Signed)
He has had onset of acute skin eruption which is incredibly itchy but denies any recent new medications, environmental exposure or new soaps.  The presentation is highly suspicious for dermatitis herpetiformis but it would be highly unusual in this situation.  Contact dermatitis cannot be ruled out. The patient has a dermatologist and recommend he makes an appointment. In the meantime, I recommend conservative measure with topical emollient cream, Benadryl and topical 1% hydrocortisone over-the-counter. If he is not able to see his dermatologist in the near future and if the rash gets worse, I will prescribed oral prednisone.

## 2015-01-14 NOTE — Telephone Encounter (Signed)
Informed wife of appts today.  Instructed her to bring all the med bottles pt has been on in the past two months.  She verbalized understanding.

## 2015-01-14 NOTE — Telephone Encounter (Signed)
He needs urgent eval today I can work him in at 115 pm, labs at 1245 pm Please ask him to bring all pill bottles including recent antibiotics/Rx he had last 2 months I will place POF

## 2015-01-14 NOTE — Telephone Encounter (Signed)
Talked to patient here in office. Scheduled URGENT ADD ON Lab and Appt for TODAY.       [facility].

## 2015-01-20 ENCOUNTER — Encounter: Payer: Self-pay | Admitting: Family Medicine

## 2015-01-20 ENCOUNTER — Ambulatory Visit (INDEPENDENT_AMBULATORY_CARE_PROVIDER_SITE_OTHER): Payer: Medicare Other | Admitting: Family Medicine

## 2015-01-20 VITALS — BP 128/80 | HR 68 | Temp 96.9°F | Ht 73.5 in | Wt 195.0 lb

## 2015-01-20 DIAGNOSIS — E785 Hyperlipidemia, unspecified: Secondary | ICD-10-CM

## 2015-01-20 NOTE — Progress Notes (Signed)
   HPI  Patient presents today here to discuss BPH and hyperlipidemia.  Patient explains that the tamsulosin is helping quite a bit. He is only getting up once on most nights and somnolence does not have to get up at all. He denies any dizziness with the medication.  Hyperlipidemia Fasting today Taking Lipitor regularly No other complaints  PMH: Smoking status noted ROS: Per HPI  Objective: BP 128/80 mmHg  Pulse 68  Temp(Src) 96.9 F (36.1 C) (Oral)  Ht 6' 1.5" (1.867 m)  Wt 195 lb (88.451 kg)  BMI 25.38 kg/m2 Gen: NAD, alert, cooperative with exam HEENT: NCAT CV: RRR, good S1/S2, no murmur Resp: CTABL, no wheezes, non-labored Ext: No edema, warm Neuro: Alert and oriented, No gross deficits  Assessment and plan:  #  hyperlipidemia Lipid panel today, fasting Last glucose was 105 fasting  # BPH Improved on Flomax Continue Last PSA was normal   Orders Placed This Encounter  Procedures  . Lipid panel     Laroy Apple, MD [facility] 01/20/2015, 12:36 PM

## 2015-01-20 NOTE — Patient Instructions (Signed)
Great to see you!  Lets plan to follow up every 6 months unless you need me sooner.

## 2015-01-21 LAB — LIPID PANEL
CHOLESTEROL TOTAL: 114 mg/dL (ref 100–199)
Chol/HDL Ratio: 1.8 ratio units (ref 0.0–5.0)
HDL: 63 mg/dL (ref >39)
LDL Calculated: 33 mg/dL (ref 0–99)
TRIGLYCERIDES: 89 mg/dL (ref 0–149)
VLDL Cholesterol Cal: 18 mg/dL (ref 5–40)

## 2015-02-06 ENCOUNTER — Encounter: Payer: Self-pay | Admitting: Pharmacist

## 2015-02-06 ENCOUNTER — Ambulatory Visit (INDEPENDENT_AMBULATORY_CARE_PROVIDER_SITE_OTHER): Payer: Medicare Other | Admitting: Pharmacist

## 2015-02-06 VITALS — BP 110/64 | HR 72 | Ht 71.75 in | Wt 197.5 lb

## 2015-02-06 DIAGNOSIS — Z0000 Encounter for general adult medical examination without abnormal findings: Secondary | ICD-10-CM

## 2015-02-06 DIAGNOSIS — Z1211 Encounter for screening for malignant neoplasm of colon: Secondary | ICD-10-CM

## 2015-02-06 NOTE — Progress Notes (Signed)
Patient ID: Anthony Kelly, male   DOB: 1945/04/17, 70 y.o.   MRN: [id number]    Subjective:   Anthony Kelly is a 70 y.o. white, male who presents for a subsequent Medicare Annual Wellness Visit. Mr. Sunday wife is present with him today.  He appears in NAD and is cooperative.  Patient expressed concern of a spot on his left jaw line that has bothered him for about 1 week.  He has had several areas of abnormal growth removed lately and wants to know if he should notify dermatologist about this spot.  I appear that the area might be just a spot of an ingrown hair.  It is red and there is a small amount of exudate.    Review of Systems  Review of Systems  Constitutional: Negative.   HENT: Negative.   Eyes: Positive for blurred vision (right eye which he had complications from cataracts surgery in 2016). Negative for double vision and photophobia.  Respiratory: Negative.   Cardiovascular: Negative.   Gastrointestinal: Negative.   Genitourinary: Negative.   Musculoskeletal: Negative.   Skin: Negative.   Neurological: Negative.   Endo/Heme/Allergies: Negative.   Psychiatric/Behavioral: Negative.       Current Medications (verified) Outpatient Encounter Prescriptions as of 02/06/2015  Medication Sig  . aspirin EC 81 MG tablet Take 81 mg by mouth every morning.  Marland Kitchen atorvastatin (LIPITOR) 10 MG tablet Take 1 tablet (10 mg total) by mouth every evening.  . diltiazem (CARDIZEM CD) 120 MG 24 hr capsule TAKE 1 CAPSULE EVERY MORNING  . Multiple Vitamin (MULTIVITAMIN WITH MINERALS) TABS tablet Take 1 tablet by mouth every morning.   Marland Kitchen omeprazole (PRILOSEC) 20 MG capsule Take 20 mg by mouth daily.  . Probiotic Product (PROBIOTIC DAILY PO) Take 1 capsule by mouth 3 (three) times daily.   . tamsulosin (FLOMAX) 0.4 MG CAPS capsule Take 2 capsules (0.8 mg total) by mouth daily after supper.  . TOPROL XL 25 MG 24 hr tablet TAKE 1 TABLET DAILY   No facility-administered encounter medications on file  as of 02/06/2015.    Allergies (verified) Review of patient's allergies indicates no known allergies.   History: Past Medical History  Diagnosis Date  . PSVT (paroxysmal supraventricular tachycardia) ([place])   . Arthritis   . Atrial fibrillation ([place])     a. Isolated episode in the setting of acute pericarditis 04/2013. Was not placed on anticoag.  Marland Kitchen Acute pericarditis     a. 04/2013 -adm with CP, elevated CRP. H/o coronary artery calcification on prior CT but nuc was negative, EF 71%.  . Pain in joint, pelvic region and thigh 08/01/2013  . Diverticulitis 08/16/2013  . Hyperlipidemia     elevated triglycerides  . BPH (benign prostatic hyperplasia)   . Cataract   . 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) 05/11/2011    Initial dx 6/04 left inguinal adenopathy Rx observation; convert to hi grade 11/05 Rx CHOP-R; lesion right lung resected 12/08: low grade NHL; new lesion left submandibular gland 2/13  resected 04/30/11 lo grade NHL  . 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 Lung lobectomy      right side  . Cholecystectomy    . Lymph node biopsy      in groin with removal  . Meniscus repair  right knee  . Vein ligation and stripping      right leg  . Tonsillectomy      as a child  . Submandibular gland excision  04/2011  . Submandibular gland excision  04/30/2011    Procedure: EXCISION SUBMANDIBULAR GLAND;  Surgeon: Jerrell Belfast, MD;  Location: [facility];  Service: ENT;  Laterality: Left;  WITH DIAGNOSTIC BIOPSY  . Exploratory laparotomy    . Prostate surgery    . Refractive surgery Right     piece of metal removed  . Eye surgery  2016    cataract   Family History  Problem Relation Age of Onset  . Anesthesia problems Neg Hx   . Heart disease Father   . Heart attack Father     x 3  . Cancer Sister     breast ca    . Cancer Brother     prostate ca  . Heart attack Sister   . Cancer Sister     breast  . Heart disease Brother   . Heart disease Brother   . Diabetes Brother   . Heart disease Brother   . Heart disease Brother   . Heart disease Brother   . Heart disease Brother   . Heart disease Brother    Social History   Occupational History  . Not on file.   Social History Main Topics  . Smoking status: Former Smoker -- 1.50 packs/day for 25 years    Types: Cigarettes    Start date: 12/12/1962    Quit date: 01/11/1990  . Smokeless tobacco: Never Used  . Alcohol Use: No  . Drug Use: No  . Sexual Activity: Not Currently    Do you feel safe at home?  Yes  Dietary issues and exercise activities discussed: Current Exercise Habits:: Home exercise routine, Type of exercise: strength training/weights;calisthenics, Frequency (Times/Week): 4, Intensity: Moderate  Current Dietary habits:  Per wife he eat an ice cream cone every night and he is not following a low fat diet.    Cardiac Risk Factors include: advanced age (>3mn, >>66women);dyslipidemia;family history of premature cardiovascular disease;male gender  Objective:    Today's Vitals   02/06/15 1155  BP: 110/64  Pulse: 72  Height: 5' 11.75" (1.822 m)  Weight: 197 lb 8 oz (89.585 kg)  PainSc: 0-No pain   Body mass index is 26.99 kg/(m^2).   Activities of Daily Living In your present state of health, do you have any difficulty performing the following activities: 02/06/2015  Hearing? N  Vision? Y  Difficulty concentrating or making decisions? N  Walking or climbing stairs? N  Dressing or bathing? N  Doing errands, shopping? N  Preparing Food and eating ? N  Using the Toilet? N  In the past six months, have you accidently leaked urine? N  Do you have problems with loss of bowel control? N  Managing your Medications? N  Managing your Finances? N  Housekeeping or managing your Housekeeping? N    Are there smokers in  your home (other than you)? No    Depression Screen PHQ 2/9 Scores 02/06/2015 01/20/2015 12/18/2014 10/17/2014  PHQ - 2 Score 0 0 0 0    Fall Risk Fall Risk  02/06/2015 01/20/2015 12/18/2014 10/17/2014 05/20/2014  Falls in the past year? No No No No No    Cognitive Function: MMSE - Mini Mental State Exam 02/06/2015  Orientation to time 5  Orientation to Place 5  Registration 3  Attention/ Calculation 5  Recall 3  Language- name 2 objects 2  Language- repeat 1  Language- follow 3 step command 3  Language- read & follow direction 1  Write a sentence 1  Copy design 1  Total score 30    Immunizations and Health Maintenance Immunization History  Administered Date(s) Administered  . Influenza,inj,Quad PF,36+ Mos 09/27/2014  . Influenza-Unspecified 09/11/2012, 11/11/2013  . Pneumococcal Conjugate-13 03/07/2013  . Pneumococcal Polysaccharide-23 09/25/2012   Health Maintenance Due  Topic Date Due  . Hepatitis C Screening  06/25/1945    Patient Care Team: [name], MD as PCP - General (Family Medicine) [name], [name], [name], [name], MD as Consulting Physician (Dermatology)  Indicate any recent Medical Services you may have received from other than [facility] providers in the past year (date may be approximate).    Assessment:    Annual Wellness Visit    Screening Tests Health Maintenance  Topic Date Due  . Hepatitis C Screening  05/20/1945  . INFLUENZA VACCINE  08/12/2015  . TETANUS/TDAP  08/10/2020  . COLONOSCOPY  02/23/2024  . ZOSTAVAX  Completed  . PNA vac Low Risk Adult  Completed        Plan:   During the course of the visit Lee was educated and counseled about the following appropriate screening and preventive services:   Vaccines to include Pneumoccal, Influenza, Hepatitis B, Td,  Zostavax - all vaccines are UTD  Colorectal cancer screening - colonoscopy done less than 1 year ago  Cardiovascular disease screening - EKG and Echo last 2015  Diabetes screening - UTD last FBG was 84  Glaucoma screening / Diabetic Eye Exam- UTD  Nutrition counseling - recommended limiting intake of sweets -consider smaller ice cream cones.  Also discussed limiting high fat foods.   Prostate cancer screening - UTD  Physical activity - continue with running and other exercise - goal is at least 150 minutes per week.  Benzoyl peroxide - apply to spot on jaw/face once or twice a day for 1 week, if not improved then recommend call Dr Allyson Sabal for appt to evaluate  No orders of the defined types were placed in this encounter.      Patient Instructions (the written plan) were given to the patient.   Cherre Robins, [facility]   02/06/2015

## 2015-02-06 NOTE — Patient Instructions (Addendum)
  Anthony Kelly , Thank you for taking time to come for your Medicare Wellness Visit. I appreciate your ongoing commitment to your health goals. Please review the following plan we discussed and let me know if I can assist you in the future.   These are the goals we discussed: Continue with exercise /  Physical activity  Try benzoly peroxide on spot on face for 1 to 2 weeks - if not better then contact Dr Allyson Sabal for evaluation.   This is a list of the screening recommended for you and due dates:  Health Maintenance  Topic Date Due  .  Hepatitis C: One time screening is recommended by Center for Disease Control  (CDC) for  adults born from [birth year] through 1966.   Jun 14, 1945  . Flu Shot  08/12/2015  . Tetanus Vaccine  08/10/2020  . Colon Cancer Screening  02/23/2024  . Shingles Vaccine  Completed  . Pneumonia vaccines  Completed

## 2015-02-20 ENCOUNTER — Other Ambulatory Visit: Payer: Self-pay | Admitting: Family Medicine

## 2015-02-24 ENCOUNTER — Other Ambulatory Visit: Payer: Self-pay | Admitting: Cardiovascular Disease

## 2015-02-27 ENCOUNTER — Other Ambulatory Visit: Payer: Medicare Other

## 2015-02-27 DIAGNOSIS — Z1211 Encounter for screening for malignant neoplasm of colon: Secondary | ICD-10-CM

## 2015-02-28 ENCOUNTER — Other Ambulatory Visit: Payer: Self-pay | Admitting: Hematology and Oncology

## 2015-02-28 LAB — FECAL OCCULT BLOOD, IMMUNOCHEMICAL: Fecal Occult Bld: NEGATIVE

## 2015-03-03 ENCOUNTER — Encounter: Payer: Self-pay | Admitting: Pharmacist

## 2015-03-11 ENCOUNTER — Telehealth: Payer: Self-pay

## 2015-03-11 ENCOUNTER — Telehealth: Payer: Self-pay | Admitting: *Deleted

## 2015-03-11 ENCOUNTER — Other Ambulatory Visit: Payer: Self-pay | Admitting: Hematology and Oncology

## 2015-03-11 DIAGNOSIS — C8510 Unspecified B-cell lymphoma, unspecified site: Secondary | ICD-10-CM

## 2015-03-11 NOTE — Telephone Encounter (Signed)
Patient is scheduled for a CT of the chest , abdomen and pelvis.  He states that his neck has been sore for 3 weeks, below his chin/mid neck on both sides.  He is wondering if the scan scheduled on 03/26/15 can be extended to cover his neck.  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  He would like a call back from Dr. Alvy Bimler or her nurse to advise.

## 2015-03-11 NOTE — Telephone Encounter (Signed)
-----   Message from [name], MD sent at 03/11/2015 11:08 AM EST ----- Regarding: FW: CT neck Pls call CT to make sure they can add it to be done same time ----- Message -----    From: Stanford Breed    Sent: 03/11/2015  11:02 AM      To: [name], MD Subject: RE: CT neck                                    Authorized for ct neck ----- Message -----    From: [name], MD    Sent: 03/11/2015  10:39 AM      To: Maudie Flakes, RN, # Subject: CT neck                                        I added CT neck, hopefully, can do on the same day as 3/15

## 2015-03-11 NOTE — Telephone Encounter (Signed)
I will add CT neck

## 2015-03-11 NOTE — Telephone Encounter (Signed)
CT neck confirmed with scheduling to be done on 03/26/15. Pt/wife notified

## 2015-03-12 ENCOUNTER — Telehealth: Payer: Self-pay | Admitting: *Deleted

## 2015-03-12 NOTE — Telephone Encounter (Signed)
LVM for pt/wife that CT neck has been added to CT CAP on 3/15.  Confirmed that all tests are scheduled with Peggy in Radiology.

## 2015-03-19 DIAGNOSIS — L570 Actinic keratosis: Secondary | ICD-10-CM | POA: Diagnosis not present

## 2015-03-26 ENCOUNTER — Encounter (HOSPITAL_COMMUNITY): Payer: Self-pay

## 2015-03-26 ENCOUNTER — Ambulatory Visit (HOSPITAL_COMMUNITY)
Admission: RE | Admit: 2015-03-26 | Discharge: 2015-03-26 | Disposition: A | Discharge status: Home / Self Care | Payer: Medicare Other | Source: Ambulatory Visit | Attending: Hematology and Oncology | Admitting: Hematology and Oncology

## 2015-03-26 ENCOUNTER — Other Ambulatory Visit (HOSPITAL_BASED_OUTPATIENT_CLINIC_OR_DEPARTMENT_OTHER): Payer: Medicare Other

## 2015-03-26 DIAGNOSIS — N400 Enlarged prostate without lower urinary tract symptoms: Secondary | ICD-10-CM | POA: Diagnosis not present

## 2015-03-26 DIAGNOSIS — C8510 Unspecified B-cell lymphoma, unspecified site: Secondary | ICD-10-CM | POA: Diagnosis not present

## 2015-03-26 DIAGNOSIS — K449 Diaphragmatic hernia without obstruction or gangrene: Secondary | ICD-10-CM | POA: Insufficient documentation

## 2015-03-26 DIAGNOSIS — I2510 Atherosclerotic heart disease of native coronary artery without angina pectoris: Secondary | ICD-10-CM | POA: Diagnosis not present

## 2015-03-26 DIAGNOSIS — M47812 Spondylosis without myelopathy or radiculopathy, cervical region: Secondary | ICD-10-CM | POA: Insufficient documentation

## 2015-03-26 DIAGNOSIS — C7800 Secondary malignant neoplasm of unspecified lung: Secondary | ICD-10-CM | POA: Diagnosis not present

## 2015-03-26 DIAGNOSIS — R911 Solitary pulmonary nodule: Secondary | ICD-10-CM | POA: Diagnosis not present

## 2015-03-26 LAB — COMPREHENSIVE METABOLIC PANEL
ALT: 24 U/L (ref 0–55)
AST: 23 U/L (ref 5–34)
Albumin: 4.3 g/dL (ref 3.5–5.0)
Alkaline Phosphatase: 69 U/L (ref 40–150)
Anion Gap: 8 mEq/L (ref 3–11)
BUN: 13.7 mg/dL (ref 7.0–26.0)
CALCIUM: 9.8 mg/dL (ref 8.4–10.4)
CHLORIDE: 105 meq/L (ref 98–109)
CO2: 27 mEq/L (ref 22–29)
CREATININE: 1.2 mg/dL (ref 0.7–1.3)
EGFR: 61 mL/min/{1.73_m2} — ABNORMAL LOW (ref >90)
GLUCOSE: 95 mg/dL (ref 70–140)
POTASSIUM: 4.6 meq/L (ref 3.5–5.1)
SODIUM: 140 meq/L (ref 136–145)
Total Bilirubin: 0.8 mg/dL (ref 0.20–1.20)
Total Protein: 7.3 g/dL (ref 6.4–8.3)

## 2015-03-26 LAB — CBC WITH DIFFERENTIAL/PLATELET
BASO%: 0.6 % (ref 0.0–2.0)
BASOS ABS: 0 10*3/uL (ref 0.0–0.1)
EOS%: 1.8 % (ref 0.0–7.0)
Eosinophils Absolute: 0.1 10*3/uL (ref 0.0–0.5)
HEMATOCRIT: 50.6 % — AB (ref 38.4–49.9)
HEMOGLOBIN: 16.9 g/dL (ref 13.0–17.1)
LYMPH#: 1.1 10*3/uL (ref 0.9–3.3)
LYMPH%: 15.2 % (ref 14.0–49.0)
MCH: 30.6 pg (ref 27.2–33.4)
MCHC: 33.3 g/dL (ref 32.0–36.0)
MCV: 91.9 fL (ref 79.3–98.0)
MONO#: 0.7 10*3/uL (ref 0.1–0.9)
MONO%: 9.9 % (ref 0.0–14.0)
NEUT#: 5.2 10*3/uL (ref 1.5–6.5)
NEUT%: 72.5 % (ref 39.0–75.0)
Platelets: 163 10*3/uL (ref 140–400)
RBC: 5.5 10*6/uL (ref 4.20–5.82)
RDW: 13.6 % (ref 11.0–14.6)
WBC: 7.2 10*3/uL (ref 4.0–10.3)

## 2015-03-26 LAB — LACTATE DEHYDROGENASE: LDH: 148 U/L (ref 125–245)

## 2015-03-26 MED ORDER — IOHEXOL 300 MG/ML  SOLN
100.0000 mL | Freq: Once | INTRAMUSCULAR | Status: AC | PRN
  Administered 2015-03-26: 100 mL via INTRAVENOUS

## 2015-03-27 ENCOUNTER — Ambulatory Visit (HOSPITAL_BASED_OUTPATIENT_CLINIC_OR_DEPARTMENT_OTHER): Payer: Medicare Other | Admitting: Hematology and Oncology

## 2015-03-27 ENCOUNTER — Telehealth: Payer: Self-pay | Admitting: Hematology and Oncology

## 2015-03-27 ENCOUNTER — Encounter: Payer: Self-pay | Admitting: Hematology and Oncology

## 2015-03-27 VITALS — BP 121/66 | HR 70 | Temp 97.7°F | Resp 18 | Wt 199.1 lb

## 2015-03-27 DIAGNOSIS — Z8572 Personal history of non-Hodgkin lymphomas: Secondary | ICD-10-CM

## 2015-03-27 DIAGNOSIS — R21 Rash and other nonspecific skin eruption: Secondary | ICD-10-CM

## 2015-03-27 DIAGNOSIS — C8510 Unspecified B-cell lymphoma, unspecified site: Secondary | ICD-10-CM

## 2015-03-27 NOTE — Telephone Encounter (Signed)
Gave and printed appt sched and avs for pt for Sept °

## 2015-03-27 NOTE — Progress Notes (Signed)
[facility] OFFICE PROGRESS NOTE  Patient Care Team: [name], MD as PCP - General (Family Medicine) [name], [name], [name], [name], MD as Consulting Physician (Dermatology)  SUMMARY OF ONCOLOGIC HISTORY: Oncology History   Low grade B-cell lymphoma   Primary site: Lymphoid Neoplasms   Staging method: AJCC 6th Edition   Clinical: Stage IV signed by [name], MD on 09/26/2013  9:15 AM   Summary: Stage IV        History of B-cell lymphoma   12/10/2003 Surgery 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   12/12/2003 - 06/01/2004 Chemotherapy He was treated with R. CHOP chemotherapy which show complete remission. The number of cycles of R. CHOP chemotherapy was unknown.   12/19/2006 Surgery Lung resection show follicular lymphoma.   01/02/2007 - 09/01/2008 Chemotherapy The patient was treated with single agent rituximab alone.   01/19/2007 Bone Marrow Biopsy Bone marrow biopsy was negative.   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   05/08/2013 - 05/11/2013 Hospital Admission The patient was admitted to the hospital for management of pericarditis. CT scan showed extensive lymphadenopathy.   06/07/2013 Imaging PET/CT scan showed extensive lymphadenopathy   06/25/2013 Procedure He has placement of Infuse-a-Port.   06/28/2013 Bone Marrow Biopsy Bone marrow biopsy is positive for lymphoma involvement.   07/04/2013 - 11/22/2013 Chemotherapy He is treated with 6 cycles of bendamustine with rituximab.   09/24/2013 Imaging Repeat PET scan show complete remission.   12/26/2013 Imaging PEt scan showed complete remission   09/26/2014 Imaging CT scan of the chest abdomen and pelvis show no evidence of disease   03/26/2015 Imaging CT scan showed no evidence of lymphoma     INTERVAL HISTORY: Please see below for problem oriented charting. He feels well. Denies recent infection. No new lymphadenopathy.  REVIEW OF SYSTEMS:   Constitutional: Denies fevers, chills or abnormal weight loss Eyes: Denies blurriness of vision Ears, nose, mouth, throat, and face: Denies mucositis or sore throat Respiratory: Denies cough, dyspnea or wheezes Cardiovascular: Denies palpitation, chest discomfort or lower extremity swelling Gastrointestinal:  Denies nausea, heartburn or change in bowel habits Skin: Denies abnormal skin rashes Lymphatics: Denies new lymphadenopathy or easy bruising Neurological:Denies numbness, tingling or new weaknesses Behavioral/Psych: Mood is stable, no new changes  All other systems were reviewed with the patient and are negative.  I have reviewed the past medical history, past surgical history, social history and family history with the patient and they are unchanged from previous note.  ALLERGIES:  has No Known Allergies.  MEDICATIONS:  Current Outpatient Prescriptions  Medication Sig Dispense Refill  . aspirin EC 81 MG tablet Take 81 mg by mouth every morning.    Marland Kitchen atorvastatin (LIPITOR) 10 MG tablet Take 1 tablet (10 mg total) by mouth every evening. 90 tablet 3  . cetirizine (ZYRTEC) 10 MG tablet TAKE 1 TABLET DAILY 90 tablet 2  . diltiazem (CARDIZEM CD) 120 MG 24 hr capsule TAKE 1 CAPSULE EVERY MORNING 90 capsule 2  . Multiple Vitamin (MULTIVITAMIN WITH MINERALS) TABS tablet Take 1 tablet by mouth every morning.     Marland Kitchen omeprazole (PRILOSEC) 20 MG capsule Take 20 mg by mouth daily.    Marland Kitchen omeprazole (PRILOSEC) 20 MG capsule TAKE 1 CAPSULE DAILY 90 capsule 2  . Probiotic Product (PROBIOTIC DAILY PO) Take 1 capsule by mouth 3 (three)  times daily.     . tamsulosin (FLOMAX) 0.4 MG CAPS capsule Take 2 capsules (0.8 mg total) by mouth daily after supper. 60 capsule 11  . TOPROL XL 25 MG 24 hr tablet TAKE 1 TABLET DAILY 90 tablet 1   No  current facility-administered medications for this visit.    PHYSICAL EXAMINATION: ECOG PERFORMANCE STATUS: 0 - Asymptomatic  Filed Vitals:   03/27/15 1019  BP: 121/66  Pulse: 70  Temp: 97.7 F (36.5 C)  Resp: 18   Filed Weights   03/27/15 1019  Weight: 199 lb 1.6 oz (90.311 kg)    GENERAL:alert, no distress and comfortable SKIN: Noted some skin lesion and bruising, related to sun exposure EYES: normal, Conjunctiva are pink and non-injected, sclera clear OROPHARYNX:no exudate, no erythema and lips, buccal mucosa, and tongue normal  NECK: supple, thyroid normal size, non-tender, without nodularity LYMPH:  no palpable lymphadenopathy in the cervical, axillary or inguinal LUNGS: clear to auscultation and percussion with normal breathing effort HEART: regular rate & rhythm and no murmurs and no lower extremity edema ABDOMEN:abdomen soft, non-tender and normal bowel sounds Musculoskeletal:no cyanosis of digits and no clubbing  NEURO: alert & oriented x 3 with fluent speech, no focal motor/sensory deficits  LABORATORY DATA:  I have reviewed the data as listed    Component Value Date/Time   NA 140 03/26/2015 0930   NA 141 08/19/2014 0921   NA 144 12/07/2013 1447   NA 139 07/20/2011 0800   K 4.6 03/26/2015 0930   K 5.0 08/19/2014 0921   K 4.4 07/20/2011 0800   CL 101 08/19/2014 0921   CL 104 03/24/2012 1024   CL 100 07/20/2011 0800   CO2 27 03/26/2015 0930   CO2 24 08/19/2014 0921   CO2 29 07/20/2011 0800   GLUCOSE 95 03/26/2015 0930   GLUCOSE 105* 08/19/2014 0921   GLUCOSE 108* 12/07/2013 1447   GLUCOSE 80 03/24/2012 1024   GLUCOSE 100 07/20/2011 0800   BUN 13.7 03/26/2015 0930   BUN 15 08/19/2014 0921   BUN 10 12/07/2013 1447   BUN 14 07/20/2011 0800   CREATININE 1.2 03/26/2015 0930   CREATININE 1.00 08/19/2014 0921   CREATININE 1.2 07/20/2011 0800   CALCIUM 9.8 03/26/2015 0930   CALCIUM 9.2 08/19/2014 0921   CALCIUM 8.7 07/20/2011 0800   PROT 7.3 03/26/2015  0930   PROT 6.4 08/19/2014 0921   PROT 6.6 12/07/2013 1447   PROT 7.2 07/20/2011 0800   ALBUMIN 4.3 03/26/2015 0930   ALBUMIN 4.2 08/19/2014 0921   ALBUMIN 3.6 12/07/2013 1447   AST 23 03/26/2015 0930   AST 16 08/19/2014 0921   AST 25 07/20/2011 0800   ALT 24 03/26/2015 0930   ALT 18 08/19/2014 0921   ALT 29 07/20/2011 0800   ALKPHOS 69 03/26/2015 0930   ALKPHOS 65 08/19/2014 0921   ALKPHOS 55 07/20/2011 0800   BILITOT 0.80 03/26/2015 0930   BILITOT 0.5 08/19/2014 0921   BILITOT 0.6 01/18/2014 1054   BILITOT 1.00 07/20/2011 0800   GFRNONAA 76 08/19/2014 0921   GFRAA 88 08/19/2014 0921    No results found for: SPEP, UPEP  Lab Results  Component Value Date   WBC 7.2 03/26/2015   NEUTROABS 5.2 03/26/2015   HGB 16.9 03/26/2015   HCT 50.6* 03/26/2015   MCV 91.9 03/26/2015   PLT 163 03/26/2015      Chemistry      Component Value Date/Time   NA 140 03/26/2015 0930   NA 141  08/19/2014 0921   NA 144 12/07/2013 1447   NA 139 07/20/2011 0800   K 4.6 03/26/2015 0930   K 5.0 08/19/2014 0921   K 4.4 07/20/2011 0800   CL 101 08/19/2014 0921   CL 104 03/24/2012 1024   CL 100 07/20/2011 0800   CO2 27 03/26/2015 0930   CO2 24 08/19/2014 0921   CO2 29 07/20/2011 0800   BUN 13.7 03/26/2015 0930   BUN 15 08/19/2014 0921   BUN 10 12/07/2013 1447   BUN 14 07/20/2011 0800   CREATININE 1.2 03/26/2015 0930   CREATININE 1.00 08/19/2014 0921   CREATININE 1.2 07/20/2011 0800      Component Value Date/Time   CALCIUM 9.8 03/26/2015 0930   CALCIUM 9.2 08/19/2014 0921   CALCIUM 8.7 07/20/2011 0800   ALKPHOS 69 03/26/2015 0930   ALKPHOS 65 08/19/2014 0921   ALKPHOS 55 07/20/2011 0800   AST 23 03/26/2015 0930   AST 16 08/19/2014 0921   AST 25 07/20/2011 0800   ALT 24 03/26/2015 0930   ALT 18 08/19/2014 0921   ALT 29 07/20/2011 0800   BILITOT 0.80 03/26/2015 0930   BILITOT 0.5 08/19/2014 0921   BILITOT 0.6 01/18/2014 1054   BILITOT 1.00 07/20/2011 0800       RADIOGRAPHIC  STUDIES: I have personally reviewed the radiological images as listed and agreed with the findings in the report. Ct Soft Tissue Neck W Contrast  03/26/2015  CLINICAL DATA:  Low-grade B-cell lymphoma.  Neck pain. EXAM: CT NECK WITH CONTRAST TECHNIQUE: Multidetector CT imaging of the neck was performed using the standard protocol following the bolus administration of intravenous contrast. CONTRAST:  136m OMNIPAQUE IOHEXOL 300 MG/ML  SOLN COMPARISON:  PET scan 12/26/2013 and 06/07/2013. FINDINGS: Pharynx and larynx: No focal mucosal or submucosal lesions are present. Vocal cords are midline and symmetric. Tongue base is within normal limits. Intrinsic tongue musculature is within normal limits. Salivary glands: Post treatment changes of the left submandibular gland are stable. The right submandibular gland and parotid glands bilaterally are within normal limits. Thyroid: Negative Lymph nodes: No significant cervical adenopathy is present. Vascular: No significant focal stenosis or atherosclerotic calcifications are evident. Limited intracranial: Within normal limits. Visualized orbits: Not imaged. Mastoids and visualized paranasal sinuses: Clear Skeleton: Chronic loss of disc height and endplate degenerative changes are present at C3-4, C5-6, and C6-7. Uncovertebral spurring is present bilaterally at these levels. There slight retrolisthesis at C3-4. Postsurgical changes are present along the alveolar ridge of the maxilla. Implant posts are noted. No focal lytic or blastic lesions are present. Upper chest: Centrilobular emphysematous changes are noted. The upper mediastinum is within normal limits. IMPRESSION: 1. No evidence for residual or recurrent lymph node disease in the neck. 2. Multilevel spondylosis in the cervical spine as described. Electronically Signed   By: CSan MorelleM.D.   On: 03/26/2015 13:36   Ct Chest W Contrast  03/26/2015  CLINICAL DATA:  Non-Hodgkin lymphoma, lung metastases.  EXAM: CT CHEST, ABDOMEN, AND PELVIS WITH CONTRAST TECHNIQUE: Multidetector CT imaging of the chest, abdomen and pelvis was performed following the standard protocol during bolus administration of intravenous contrast. CONTRAST:  1028mOMNIPAQUE IOHEXOL 300 MG/ML  SOLN COMPARISON:  09/26/2014. FINDINGS: CT CHEST FINDINGS Mediastinum/Lymph Nodes: No pathologically enlarged mediastinal, hilar or axillary lymph nodes. Three-vessel coronary artery calcification. Heart size normal. No pericardial effusion. Moderate hiatal hernia. Lungs/Pleura: Mild centrilobular emphysema. Postoperative changes in the right lower lobe with associated scarring and volume loss. 4 mm left  lower lobe nodule (4/43), unchanged. Lungs are otherwise clear. No pleural fluid. Airway is otherwise unremarkable. Musculoskeletal: Scattered fluid density lesions extend from the spinal canal into the extrapleural space, indicative of meningoceles, as before. No worrisome lytic or sclerotic lesions. CT ABDOMEN PELVIS FINDINGS Hepatobiliary: Low-attenuation lesion in the left hepatic lobe measures 11 mm, unchanged and likely a cyst. Biliary ductal dilatation after cholecystectomy is unchanged. Pancreas: Negative. Spleen: Negative. Adrenals/Urinary Tract: Adrenal glands and right kidney are unremarkable. Low-attenuation lesions in the left kidney measure up to 2.8 cm and statistically, likely represent cysts. Ureters are decompressed. Bladder is unremarkable. Stomach/Bowel: Moderate hiatal hernia. Stomach, small bowel and colon are unremarkable. Appendix is not visualized. Vascular/Lymphatic: Atherosclerotic calcification of the arterial vasculature without abdominal aortic aneurysm. There is haziness within the retroperitoneal fat, without discrete adenopathy. Reproductive: Prostate is enlarged. Other: No free fluid. Small bilateral inguinal hernias contain fat. Mesenteries and peritoneum are otherwise unremarkable. Musculoskeletal: No worrisome lytic or  sclerotic lesions. Degenerative disc disease at L5-S1. IMPRESSION: 1. No evidence of lymphoma recurrence. 2. 4 mm left lower lobe nodule, stable. 3. Three-vessel coronary artery calcification. 4. Moderate hiatal hernia. 5. Enlarged prostate. Electronically Signed   By: Lorin Picket M.D.   On: 03/26/2015 12:53   Ct Abdomen Pelvis W Contrast  03/26/2015  CLINICAL DATA:  Non-Hodgkin lymphoma, lung metastases. EXAM: CT CHEST, ABDOMEN, AND PELVIS WITH CONTRAST TECHNIQUE: Multidetector CT imaging of the chest, abdomen and pelvis was performed following the standard protocol during bolus administration of intravenous contrast. CONTRAST:  1110m OMNIPAQUE IOHEXOL 300 MG/ML  SOLN COMPARISON:  09/26/2014. FINDINGS: CT CHEST FINDINGS Mediastinum/Lymph Nodes: No pathologically enlarged mediastinal, hilar or axillary lymph nodes. Three-vessel coronary artery calcification. Heart size normal. No pericardial effusion. Moderate hiatal hernia. Lungs/Pleura: Mild centrilobular emphysema. Postoperative changes in the right lower lobe with associated scarring and volume loss. 4 mm left lower lobe nodule (4/43), unchanged. Lungs are otherwise clear. No pleural fluid. Airway is otherwise unremarkable. Musculoskeletal: Scattered fluid density lesions extend from the spinal canal into the extrapleural space, indicative of meningoceles, as before. No worrisome lytic or sclerotic lesions. CT ABDOMEN PELVIS FINDINGS Hepatobiliary: Low-attenuation lesion in the left hepatic lobe measures 11 mm, unchanged and likely a cyst. Biliary ductal dilatation after cholecystectomy is unchanged. Pancreas: Negative. Spleen: Negative. Adrenals/Urinary Tract: Adrenal glands and right kidney are unremarkable. Low-attenuation lesions in the left kidney measure up to 2.8 cm and statistically, likely represent cysts. Ureters are decompressed. Bladder is unremarkable. Stomach/Bowel: Moderate hiatal hernia. Stomach, small bowel and colon are unremarkable.  Appendix is not visualized. Vascular/Lymphatic: Atherosclerotic calcification of the arterial vasculature without abdominal aortic aneurysm. There is haziness within the retroperitoneal fat, without discrete adenopathy. Reproductive: Prostate is enlarged. Other: No free fluid. Small bilateral inguinal hernias contain fat. Mesenteries and peritoneum are otherwise unremarkable. Musculoskeletal: No worrisome lytic or sclerotic lesions. Degenerative disc disease at L5-S1. IMPRESSION: 1. No evidence of lymphoma recurrence. 2. 4 mm left lower lobe nodule, stable. 3. Three-vessel coronary artery calcification. 4. Moderate hiatal hernia. 5. Enlarged prostate. Electronically Signed   By: MLorin PicketM.D.   On: 03/26/2015 12:53     ASSESSMENT & PLAN:  History of B-cell lymphoma Clinically, I detected no signs or symptoms to suggest recurrence of lymphoma. Imaging study show no evidence of recurrence I plan to see him back in 6 months for repeat history, physical examination and blood work only.   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 I recommend avoidance of excessive  sun exposure and to use protective clothing when he goes out.   Orders Placed This Encounter  Procedures  . Lactate dehydrogenase (LDH)    Standing Status: Future     Number of Occurrences:      Standing Expiration Date: 03/26/2016   All questions were answered. The patient knows to call the clinic with any problems, questions or concerns. No barriers to learning was detected. I spent 15 minutes counseling the patient face to face. The total time spent in the appointment was 20 minutes and more than 50% was on counseling and review of test results     [facility], [place], MD 03/27/2015 10:41 AM

## 2015-03-27 NOTE — Assessment & Plan Note (Signed)
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 I recommend avoidance of excessive sun exposure and to use protective clothing when he goes out.

## 2015-03-27 NOTE — Assessment & Plan Note (Signed)
Clinically, I detected no signs or symptoms to suggest recurrence of lymphoma. Imaging study show no evidence of recurrence I plan to see him back in 6 months for repeat history, physical examination and blood work only.

## 2015-04-01 ENCOUNTER — Encounter: Payer: Self-pay | Admitting: Cardiovascular Disease

## 2015-04-01 ENCOUNTER — Ambulatory Visit (INDEPENDENT_AMBULATORY_CARE_PROVIDER_SITE_OTHER): Payer: Medicare Other | Admitting: Cardiovascular Disease

## 2015-04-01 VITALS — BP 120/72 | HR 72 | Ht 72.0 in | Wt 199.0 lb

## 2015-04-01 DIAGNOSIS — I319 Disease of pericardium, unspecified: Secondary | ICD-10-CM

## 2015-04-01 DIAGNOSIS — I519 Heart disease, unspecified: Secondary | ICD-10-CM | POA: Diagnosis not present

## 2015-04-01 DIAGNOSIS — I2510 Atherosclerotic heart disease of native coronary artery without angina pectoris: Secondary | ICD-10-CM | POA: Diagnosis not present

## 2015-04-01 DIAGNOSIS — I5189 Other ill-defined heart diseases: Secondary | ICD-10-CM

## 2015-04-01 DIAGNOSIS — R002 Palpitations: Secondary | ICD-10-CM

## 2015-04-01 DIAGNOSIS — I480 Paroxysmal atrial fibrillation: Secondary | ICD-10-CM | POA: Diagnosis not present

## 2015-04-01 NOTE — Progress Notes (Signed)
Patient ID: Anthony Kelly, male   DOB: 1945-05-25, 70 y.o.   MRN: [id number]      SUBJECTIVE: Anthony Kelly returns for routine follow up. He has been doing very well and denies chest pain, shortness of breath, palpitations, leg swelling, lightheadedness, and dizziness. He has known coronary artery calcifications seen on CT (most recently 03/26/15), but had no perfusion defects with no evidence of ischemia by nuclear stress testing on 05/11/13. He and his wife have been walking and partially jogging 3 miles daily.  Review of Systems: As per "subjective", otherwise negative.  No Known Allergies  Current Outpatient Prescriptions  Medication Sig Dispense Refill  . aspirin EC 81 MG tablet Take 81 mg by mouth every morning.    Marland Kitchen atorvastatin (LIPITOR) 10 MG tablet Take 1 tablet (10 mg total) by mouth every evening. 90 tablet 3  . diltiazem (CARDIZEM CD) 120 MG 24 hr capsule TAKE 1 CAPSULE EVERY MORNING 90 capsule 2  . Multiple Vitamin (MULTIVITAMIN WITH MINERALS) TABS tablet Take 1 tablet by mouth every morning.     Marland Kitchen omeprazole (PRILOSEC) 20 MG capsule Take 20 mg by mouth daily.    . Probiotic Product (PROBIOTIC DAILY PO) Take 1 capsule by mouth 3 (three) times daily.     . tamsulosin (FLOMAX) 0.4 MG CAPS capsule Take 2 capsules (0.8 mg total) by mouth daily after supper. 60 capsule 11  . TOPROL XL 25 MG 24 hr tablet TAKE 1 TABLET DAILY 90 tablet 1   No current facility-administered medications for this visit.    Past Medical History  Diagnosis Date  . PSVT (paroxysmal supraventricular tachycardia) ([place])   . Arthritis   . Atrial fibrillation ([place])     a. Isolated episode in the setting of acute pericarditis 04/2013. Was not placed on anticoag.  Marland Kitchen Acute pericarditis     a. 04/2013 -adm with CP, elevated CRP. H/o coronary artery calcification on prior CT but nuc was negative, EF 71%.  . Pain in joint, pelvic region and thigh 08/01/2013  . Diverticulitis 08/16/2013  . Hyperlipidemia     elevated  triglycerides  . BPH (benign prostatic hyperplasia)   . Cataract   . 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 Low grade B-cell lymphoma ([place]) 05/11/2011    Initial dx 6/04 left inguinal adenopathy Rx observation; convert to hi grade 11/05 Rx CHOP-R; lesion right lung resected 12/08: low grade NHL; new lesion left submandibular gland 2/13  resected 04/30/11 lo grade NHL  . 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 Lung lobectomy      right side  . Cholecystectomy    . Lymph node biopsy      in groin with removal  . Meniscus repair      right knee  . Vein ligation and stripping      right leg  . Tonsillectomy      as a child  . Submandibular gland excision  04/2011  . Submandibular gland excision  04/30/2011    Procedure: EXCISION SUBMANDIBULAR GLAND;  Surgeon: Jerrell Belfast, MD;  Location: [facility];  Service: ENT;  Laterality: Left;  WITH DIAGNOSTIC BIOPSY  . Exploratory laparotomy    . Prostate surgery    . Refractive surgery Right     piece of metal removed  . Eye surgery  2016    cataract  Social History   Social History  . Marital Status: Married    Spouse Name: N/A  . Number of Children: N/A  . Years of Education: N/A   Occupational History  . Not on file.   Social History Main Topics  . Smoking status: Former Smoker -- 1.50 packs/day for 25 years    Types: Cigarettes    Start date: 12/12/1962    Quit date: 01/11/1990  . Smokeless tobacco: Never Used  . Alcohol Use: No  . Drug Use: No  . Sexual Activity: Not Currently   Other Topics Concern  . Not on file   Social History Narrative     Filed Vitals:   04/01/15 0852  BP: 120/72  Pulse: 72  Height: 6' (1.829 m)  Weight: 199 lb (90.266 kg)  SpO2: 94%    PHYSICAL EXAM General: NAD HEENT: Normal. Neck: No JVD, no thyromegaly. Lungs: Clear to auscultation bilaterally  with normal respiratory effort. CV: Nondisplaced PMI.  Regular rate and rhythm, normal S1/S2, no S3/S4, no murmur. No pretibial or periankle edema.  No carotid bruit.   Abdomen: Soft, nontender, no distention.  Neurologic: Alert and oriented.  Psych: Normal affect. Skin: Normal. Musculoskeletal: No gross deformities.  ECG: Most recent ECG reviewed.      ASSESSMENT AND PLAN: 1. Pericarditis: No recurrence of symptoms.  2. PSVT/PAF: No recurrences. This was reportedly an isolated episode in the setting of acute pericarditis. Thus, was not placed on anticoagulation. Continue long-acting diltiazem and long-acting metoprolol.   3. Coronary artery calcifications on CT: Symptomatically stable. Normal nuclear MPI study on 05/11/13, thus no further testing is indicated at this time. Continue ASA, beta blocker, and statin therapy.  4. Grade II diastolic dysfunction: No evidence of heart failure.  Dispo: f/u 1year.  Kate Sable, M.D., F.A.C.C.

## 2015-04-01 NOTE — Patient Instructions (Signed)
Continue all current medications. Your physician wants you to follow up in:  1 year.  You will receive a reminder letter in the mail one-two months in advance.  If you don't receive a letter, please call our office to schedule the follow up appointment   

## 2015-05-19 ENCOUNTER — Encounter: Payer: Self-pay | Admitting: Family Medicine

## 2015-05-19 MED ORDER — TAMSULOSIN HCL 0.4 MG PO CAPS: 0.8000 mg | ORAL_CAPSULE | Freq: Every day | ORAL | Status: DC

## 2015-06-12 DIAGNOSIS — L820 Inflamed seborrheic keratosis: Secondary | ICD-10-CM | POA: Diagnosis not present

## 2015-06-20 ENCOUNTER — Other Ambulatory Visit: Payer: Self-pay | Admitting: Family Medicine

## 2015-07-16 ENCOUNTER — Encounter: Payer: Self-pay | Admitting: Family Medicine

## 2015-07-17 ENCOUNTER — Other Ambulatory Visit: Payer: Self-pay | Admitting: Family Medicine

## 2015-07-17 DIAGNOSIS — I480 Paroxysmal atrial fibrillation: Secondary | ICD-10-CM

## 2015-07-17 DIAGNOSIS — E785 Hyperlipidemia, unspecified: Secondary | ICD-10-CM

## 2015-07-21 ENCOUNTER — Encounter: Payer: Self-pay | Admitting: Family Medicine

## 2015-07-21 ENCOUNTER — Ambulatory Visit (INDEPENDENT_AMBULATORY_CARE_PROVIDER_SITE_OTHER): Payer: Medicare Other | Admitting: Family Medicine

## 2015-07-21 VITALS — BP 116/80 | HR 70 | Temp 95.2°F | Ht 73.83 in | Wt 196.8 lb

## 2015-07-21 DIAGNOSIS — I2510 Atherosclerotic heart disease of native coronary artery without angina pectoris: Secondary | ICD-10-CM | POA: Diagnosis not present

## 2015-07-21 DIAGNOSIS — N400 Enlarged prostate without lower urinary tract symptoms: Secondary | ICD-10-CM | POA: Diagnosis not present

## 2015-07-21 DIAGNOSIS — I480 Paroxysmal atrial fibrillation: Secondary | ICD-10-CM

## 2015-07-21 DIAGNOSIS — E785 Hyperlipidemia, unspecified: Secondary | ICD-10-CM

## 2015-07-21 NOTE — Progress Notes (Signed)
   HPI  Patient presents today here to follow-up for Afib, BPH, and hyperlipidemia.  BPH Nocturia completely resolved He is very happy with Flomax, no side effects.  Afib Episode happened in the setting of pericarditis, he's been easily controlled without recurrence with metoprolol and diltiazem.   Hyperlipidemia Tolerating atorvastatin easily, watches his diet Exercises regularly. He jogs about 3 miles with intermittent walking 4-5 times a week.  PMH: Smoking status noted ROS: Per HPI  Objective: BP 116/80 mmHg  Pulse 70  Temp(Src) 95.2 F (35.1 C) (Oral)  Ht 6' 1.83" (1.875 m)  Wt 196 lb 12.8 oz (89.268 kg)  BMI 25.39 kg/m2 Gen: NAD, alert, cooperative with exam HEENT: NCAT CV: RRR, good S1/S2, no murmur Resp: CTABL, no wheezes, non-labored Ext: No edema, warm Neuro: Alert and oriented, No gross deficits  Assessment and plan:  # Afib- paroxsysmal Well-controlled, has not recurred so no anticoagulation, agree with cardiology No medication changes, he is on diltiazem and metoprolol   # BPH Well-controlled with Flomax  # HLD Very well controlled with atorvastatin, repeat labs annually, next check in November or December.   Anthony Apple, MD [facility] 07/21/2015, 11:56 AM

## 2015-08-25 ENCOUNTER — Other Ambulatory Visit: Payer: Self-pay | Admitting: Cardiovascular Disease

## 2015-08-29 ENCOUNTER — Telehealth: Payer: Self-pay | Admitting: Hematology and Oncology

## 2015-08-29 NOTE — Telephone Encounter (Signed)
spoke w/ pt confirmed 10/5 apt times

## 2015-09-21 ENCOUNTER — Encounter: Payer: Self-pay | Admitting: Hematology and Oncology

## 2015-09-23 ENCOUNTER — Ambulatory Visit (INDEPENDENT_AMBULATORY_CARE_PROVIDER_SITE_OTHER): Payer: Medicare Other

## 2015-09-23 ENCOUNTER — Ambulatory Visit (INDEPENDENT_AMBULATORY_CARE_PROVIDER_SITE_OTHER): Payer: Medicare Other | Admitting: Family Medicine

## 2015-09-23 ENCOUNTER — Encounter: Payer: Self-pay | Admitting: Family Medicine

## 2015-09-23 ENCOUNTER — Telehealth: Payer: Self-pay | Admitting: Family Medicine

## 2015-09-23 VITALS — BP 117/76 | HR 72 | Temp 97.1°F | Ht 73.83 in | Wt 197.8 lb

## 2015-09-23 DIAGNOSIS — M545 Low back pain, unspecified: Secondary | ICD-10-CM

## 2015-09-23 DIAGNOSIS — I2510 Atherosclerotic heart disease of native coronary artery without angina pectoris: Secondary | ICD-10-CM

## 2015-09-23 MED ORDER — TRAMADOL HCL 50 MG PO TABS: 50.0000 mg | ORAL_TABLET | Freq: Three times a day (TID) | ORAL | 0 refills | Status: DC | PRN

## 2015-09-23 MED ORDER — PREDNISONE 20 MG PO TABS: 40.0000 mg | ORAL_TABLET | Freq: Every day | ORAL | 0 refills | Status: DC

## 2015-09-23 NOTE — Telephone Encounter (Signed)
Pt notified of results Verbalizes understanding 

## 2015-09-23 NOTE — Patient Instructions (Signed)
Great to see you!  Please come back if you are not improving as expected, I would expect by this time next week you should be much better.    Back Pain, Adult Back pain is very common in adults.The cause of back pain is rarely dangerous and the pain often gets better over time.The cause of your back pain may not be known. Some common causes of back pain include:  Strain of the muscles or ligaments supporting the spine.  Wear and tear (degeneration) of the spinal disks.  Arthritis.  Direct injury to the back. For many people, back pain may return. Since back pain is rarely dangerous, most people can learn to manage this condition on their own. HOME CARE INSTRUCTIONS Watch your back pain for any changes. The following actions may help to lessen any discomfort you are feeling:  Remain active. It is stressful on your back to sit or stand in one place for long periods of time. Do not sit, drive, or stand in one place for more than 30 minutes at a time. Take short walks on even surfaces as soon as you are able.Try to increase the length of time you walk each day.  Exercise regularly as directed by your health care provider. Exercise helps your back heal faster. It also helps avoid future injury by keeping your muscles strong and flexible.  Do not stay in bed.Resting more than 1-2 days can delay your recovery.  Pay attention to your body when you bend and lift. The most comfortable positions are those that put less stress on your recovering back. Always use proper lifting techniques, including:  Bending your knees.  Keeping the load close to your body.  Avoiding twisting.  Find a comfortable position to sleep. Use a firm mattress and lie on your side with your knees slightly bent. If you lie on your back, put a pillow under your knees.  Avoid feeling anxious or stressed.Stress increases muscle tension and can worsen back pain.It is important to recognize when you are anxious or  stressed and learn ways to manage it, such as with exercise.  Take medicines only as directed by your health care provider. Over-the-counter medicines to reduce pain and inflammation are often the most helpful.Your health care provider may prescribe muscle relaxant drugs.These medicines help dull your pain so you can more quickly return to your normal activities and healthy exercise.  Apply ice to the injured area:  Put ice in a plastic bag.  Place a towel between your skin and the bag.  Leave the ice on for 20 minutes, 2-3 times a day for the first 2-3 days. After that, ice and heat may be alternated to reduce pain and spasms.  Maintain a healthy weight. Excess weight puts extra stress on your back and makes it difficult to maintain good posture. SEEK MEDICAL CARE IF:  You have pain that is not relieved with rest or medicine.  You have increasing pain going down into the legs or buttocks.  You have pain that does not improve in one week.  You have night pain.  You lose weight.  You have a fever or chills. SEEK IMMEDIATE MEDICAL CARE IF:   You develop new bowel or bladder control problems.  You have unusual weakness or numbness in your arms or legs.  You develop nausea or vomiting.  You develop abdominal pain.  You feel faint.   This information is not intended to replace advice given to you by your health care  provider. Make sure you discuss any questions you have with your health care provider.   Document Released: 12/28/2004 Document Revised: 01/18/2014 Document Reviewed: 05/01/2013 Elsevier Interactive Patient Education [employer].

## 2015-09-23 NOTE — Progress Notes (Signed)
   HPI  Patient presents today here with back pain   Patient had back pain for about one week after working on his tractor. He had no obvious insult or injury. He is a history of B-cell lymphoma in remission. He describes the pain as sharp stabbing pain in the right upper buttock/lower lumbar area, this comes and goes and leaves behind a burning type pain. It eases off when he sits in his chair but worse with standing. He has stiffness every morning.  He's tried his wife's muscle relaxers with no improvement. He's tried Tylenol as well as no improvement.  Denies any leg symptoms or bowel or bladder dysfunction, saddle anesthesia, or leg weakness.  PMH: Smoking status noted ROS: Per HPI  Objective: BP 117/76   Pulse 72   Temp 97.1 F (36.2 C) (Oral)   Ht 6' 1.83" (1.875 m)   Wt 197 lb 12.8 oz (89.7 kg)   BMI 25.51 kg/m  Gen: NAD, alert, cooperative with exam HEENT: NCAT CV: RRR, good S1/S2, no murmur Resp: CTABL, no wheezes, non-labored Ext: No edema, warm Neuro: Alert and oriented, strength 5/5 and sensation intact in bilateral lower extremities, 1+ patellar tendon reflexes symmetric  Assessment and plan:  # Right-sided low back pain Likely muscle spasm, possibly nervous involvement but no signs of sciatica He has failed Tylenol at home Given age I do not want to use high dose NSAIDs. Given prednisone 7 days, also tramadol for breakthrough pain X-ray given history of B-cell lymphoma, if no improvement of pain within 1 week would consider MRI maintain low threshold for return   Orders Placed This Encounter  Procedures  . DG Lumbar Spine 2-3 Views    Standing Status:   Future    Standing Expiration Date:   11/22/2016    Order Specific Question:   Reason for Exam (SYMPTOM  OR DIAGNOSIS REQUIRED)    Answer:   R sided low back pain, Hx of lymphoma    Order Specific Question:   Preferred imaging location?    Answer:   Internal    Meds ordered this encounter    Medications  . traMADol (ULTRAM) 50 MG tablet    Sig: Take 1 tablet (50 mg total) by mouth every 8 (eight) hours as needed.    Dispense:  30 tablet    Refill:  0  . predniSONE (DELTASONE) 20 MG tablet    Sig: Take 2 tablets (40 mg total) by mouth daily with breakfast.    Dispense:  14 tablet    Refill:  0    Laroy Apple, MD [facility] 09/23/2015, 8:31 AM

## 2015-09-23 NOTE — Telephone Encounter (Signed)
LMOM with x-ay results as per DPR of 01/08/016.

## 2015-09-24 ENCOUNTER — Encounter: Payer: Self-pay | Admitting: Family Medicine

## 2015-09-24 ENCOUNTER — Other Ambulatory Visit: Payer: Self-pay | Admitting: Family Medicine

## 2015-09-24 DIAGNOSIS — M47817 Spondylosis without myelopathy or radiculopathy, lumbosacral region: Secondary | ICD-10-CM

## 2015-09-24 DIAGNOSIS — M8580 Other specified disorders of bone density and structure, unspecified site: Secondary | ICD-10-CM

## 2015-09-26 ENCOUNTER — Ambulatory Visit: Payer: Medicare Other | Admitting: Hematology and Oncology

## 2015-09-26 ENCOUNTER — Other Ambulatory Visit: Payer: Medicare Other

## 2015-09-27 ENCOUNTER — Encounter: Payer: Self-pay | Admitting: Family Medicine

## 2015-09-28 ENCOUNTER — Encounter: Payer: Self-pay | Admitting: Family Medicine

## 2015-09-29 ENCOUNTER — Encounter: Payer: Self-pay | Admitting: Family Medicine

## 2015-09-29 ENCOUNTER — Ambulatory Visit (INDEPENDENT_AMBULATORY_CARE_PROVIDER_SITE_OTHER): Payer: Medicare Other

## 2015-09-29 DIAGNOSIS — M899 Disorder of bone, unspecified: Secondary | ICD-10-CM

## 2015-10-04 ENCOUNTER — Other Ambulatory Visit: Payer: Self-pay | Admitting: Cardiovascular Disease

## 2015-10-06 ENCOUNTER — Encounter: Payer: Self-pay | Admitting: Pharmacist

## 2015-10-06 ENCOUNTER — Ambulatory Visit (INDEPENDENT_AMBULATORY_CARE_PROVIDER_SITE_OTHER): Payer: Medicare Other | Admitting: Pharmacist

## 2015-10-06 ENCOUNTER — Other Ambulatory Visit: Payer: Self-pay | Admitting: Pharmacist

## 2015-10-06 VITALS — Ht 74.0 in | Wt 198.0 lb

## 2015-10-06 DIAGNOSIS — M8580 Other specified disorders of bone density and structure, unspecified site: Secondary | ICD-10-CM

## 2015-10-06 DIAGNOSIS — I2510 Atherosclerotic heart disease of native coronary artery without angina pectoris: Secondary | ICD-10-CM

## 2015-10-06 MED ORDER — TRAMADOL HCL 50 MG PO TABS: 50.0000 mg | ORAL_TABLET | Freq: Three times a day (TID) | ORAL | 0 refills | Status: DC | PRN

## 2015-10-06 NOTE — Progress Notes (Signed)
  Oste[facility]osis Clinic Current Height: Height: '6\' 2"'$  (188 cm)      Max Lifetime Height:  '6\' 2"'$  Current Weight: Weight: 198 lb (89.8 kg)       Ethnicity:Caucasian   HPI: Does pt already have a diagnosis of:  Osteopenia?  No Osteoporosis?  No  Back Pain?  Yes       Kyphosis?  No Prior fracture?  No Med(s) for Osteoporosis/Osteopenia:  none Med(s) previously tried for Osteoporosis/Osteopenia:  none                                                             PMH: Steroid Use?  Yes - Former.  Type/duration: took prednisone for 5 days last week but not longer term use Thyroid med?  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  Yes - GERD, omeprazole '20mg'$  capsules qod prn. Current or previous eating disorders?  No Last Vitamin D Result:  Never check Last GFR Result:  61 (03/26/2015)   FH/SH: Family history of osteoporosis?  Yes - mother, fractured hip 70 yo Parent with history of hip fracture?  Yes - mother Family history of breast cancer?  Yes - sister Exercise?  No - due to back pain but prior to hurting back he was walking / jogging 3 miles 7 days per week Smoking?  No Alcohol?  No    Calcium Assessment Calcium Intake  # of servings/day  Calcium mg  Milk (8 oz) 1  x  300  = '300mg'$   Yogurt (4 oz) / Ice cream 1 x  200 = '200mg'$   Cheese (1 oz) 0 x  200 = 0  Other Calcium sources   '250mg'$   Ca supplement MVI = '400mg'$    Estimated calcium intake per day '1150mg'$     DEXA Results Date of Test T-Score for AP Spine L1-L4 T-Score for Total Left Hip  09/29/2015 -1.5 -1.6               FRAX 10 year estimate: Total FX risk:  13%  (consider medication if >/= 20%) Hip FX risk:  4.6%  (consider medication if >/= 3%)  Assessment: Osteopenia - due to mother having fractured hip patient is at high risk of fracture  Recommendations: 1.  Start  None currently - will check testosterone, thyroid, phosphorus and vitamin d  2.   recommend calcium '1200mg'$  daily through supplementation or diet.  3.  recommend weight bearing exercise - 30 minutes at least 4 days per week - only once back pain resolved and OK with PCP to restart exercise.  4.  Counseled and educated about fall risk and prevention.  Recheck DEXA:  2 years  Time spent counseling patient:  30 minutes

## 2015-10-06 NOTE — Telephone Encounter (Signed)
[place] with refill.   Needs appt prior to next fill.   Laroy Apple, MD [facility] 10/06/2015, 5:08 PM

## 2015-10-07 ENCOUNTER — Encounter: Payer: Self-pay | Admitting: Family Medicine

## 2015-10-07 NOTE — Telephone Encounter (Signed)
Patient aware and Rx called in to [facility]

## 2015-10-08 ENCOUNTER — Other Ambulatory Visit: Payer: Medicare Other

## 2015-10-08 DIAGNOSIS — M859 Disorder of bone density and structure, unspecified: Secondary | ICD-10-CM | POA: Diagnosis not present

## 2015-10-08 DIAGNOSIS — M8580 Other specified disorders of bone density and structure, unspecified site: Secondary | ICD-10-CM | POA: Diagnosis not present

## 2015-10-08 DIAGNOSIS — R6889 Other general symptoms and signs: Secondary | ICD-10-CM | POA: Diagnosis not present

## 2015-10-09 LAB — TESTOSTERONE,FREE AND TOTAL
Testosterone, Free: 4.2 pg/mL — ABNORMAL LOW (ref 6.6–18.1)
Testosterone: 269 ng/dL (ref 264–916)

## 2015-10-09 LAB — THYROID PANEL WITH TSH
FREE THYROXINE INDEX: 1.8 (ref 1.2–4.9)
T3 UPTAKE RATIO: 27 % (ref 24–39)
T4, Total: 6.5 ug/dL (ref 4.5–12.0)
TSH: 1.93 u[IU]/mL (ref 0.450–4.500)

## 2015-10-09 LAB — PHOSPHORUS: PHOSPHORUS: 3.4 mg/dL (ref 2.5–4.5)

## 2015-10-09 LAB — VITAMIN D 25 HYDROXY (VIT D DEFICIENCY, FRACTURES): VIT D 25 HYDROXY: 44.2 ng/mL (ref 30.0–100.0)

## 2015-10-16 ENCOUNTER — Encounter: Payer: Self-pay | Admitting: Hematology and Oncology

## 2015-10-16 ENCOUNTER — Ambulatory Visit (HOSPITAL_BASED_OUTPATIENT_CLINIC_OR_DEPARTMENT_OTHER): Payer: Medicare Other | Admitting: Hematology and Oncology

## 2015-10-16 ENCOUNTER — Other Ambulatory Visit (HOSPITAL_BASED_OUTPATIENT_CLINIC_OR_DEPARTMENT_OTHER): Payer: Medicare Other

## 2015-10-16 ENCOUNTER — Telehealth: Payer: Self-pay | Admitting: Hematology and Oncology

## 2015-10-16 ENCOUNTER — Encounter: Payer: Self-pay | Admitting: Family Medicine

## 2015-10-16 VITALS — BP 110/77 | HR 80 | Temp 97.9°F | Resp 18 | Wt 195.3 lb

## 2015-10-16 DIAGNOSIS — Z8572 Personal history of non-Hodgkin lymphomas: Secondary | ICD-10-CM

## 2015-10-16 DIAGNOSIS — C8510 Unspecified B-cell lymphoma, unspecified site: Secondary | ICD-10-CM

## 2015-10-16 DIAGNOSIS — Z23 Encounter for immunization: Secondary | ICD-10-CM | POA: Diagnosis not present

## 2015-10-16 DIAGNOSIS — M8580 Other specified disorders of bone density and structure, unspecified site: Secondary | ICD-10-CM

## 2015-10-16 DIAGNOSIS — Z299 Encounter for prophylactic measures, unspecified: Secondary | ICD-10-CM

## 2015-10-16 LAB — COMPREHENSIVE METABOLIC PANEL
ALBUMIN: 3.7 g/dL (ref 3.5–5.0)
ALK PHOS: 70 U/L (ref 40–150)
ALT: 24 U/L (ref 0–55)
ANION GAP: 8 meq/L (ref 3–11)
AST: 21 U/L (ref 5–34)
BUN: 13.3 mg/dL (ref 7.0–26.0)
CALCIUM: 9.7 mg/dL (ref 8.4–10.4)
CO2: 27 mEq/L (ref 22–29)
Chloride: 106 mEq/L (ref 98–109)
Creatinine: 1.2 mg/dL (ref 0.7–1.3)
EGFR: 63 mL/min/{1.73_m2} — AB (ref >90)
Glucose: 122 mg/dl (ref 70–140)
POTASSIUM: 4.9 meq/L (ref 3.5–5.1)
Sodium: 141 mEq/L (ref 136–145)
Total Bilirubin: 0.65 mg/dL (ref 0.20–1.20)
Total Protein: 6.6 g/dL (ref 6.4–8.3)

## 2015-10-16 LAB — CBC WITH DIFFERENTIAL/PLATELET
BASO%: 0.3 % (ref 0.0–2.0)
BASOS ABS: 0 10*3/uL (ref 0.0–0.1)
EOS%: 2.4 % (ref 0.0–7.0)
Eosinophils Absolute: 0.1 10*3/uL (ref 0.0–0.5)
HEMATOCRIT: 45.2 % (ref 38.4–49.9)
HGB: 15.8 g/dL (ref 13.0–17.1)
LYMPH#: 1.3 10*3/uL (ref 0.9–3.3)
LYMPH%: 21.8 % (ref 14.0–49.0)
MCH: 30.7 pg (ref 27.2–33.4)
MCHC: 35 g/dL (ref 32.0–36.0)
MCV: 87.9 fL (ref 79.3–98.0)
MONO#: 0.4 10*3/uL (ref 0.1–0.9)
MONO%: 7.3 % (ref 0.0–14.0)
NEUT#: 4 10*3/uL (ref 1.5–6.5)
NEUT%: 68.2 % (ref 39.0–75.0)
PLATELETS: 144 10*3/uL (ref 140–400)
RBC: 5.14 10*6/uL (ref 4.20–5.82)
RDW: 12.4 % (ref 11.0–14.6)
WBC: 5.9 10*3/uL (ref 4.0–10.3)

## 2015-10-16 LAB — LACTATE DEHYDROGENASE: LDH: 152 U/L (ref 125–245)

## 2015-10-16 MED ORDER — INFLUENZA VAC SPLIT QUAD 0.5 ML IM SUSY
0.5000 mL | PREFILLED_SYRINGE | Freq: Once | INTRAMUSCULAR | Status: AC
  Administered 2015-10-16: 0.5 mL via INTRAMUSCULAR
  Filled 2015-10-16: qty 0.5

## 2015-10-16 NOTE — Assessment & Plan Note (Signed)
He has mild osteopenia. I reinforced the importance of vitamin D, calcium supplement and exercise as tolerated. I recommend avoiding testosterone replacement therapy due to strong family history of prostate cancer in his brother and evidence of prostate enlargement on recent CT.

## 2015-10-16 NOTE — Progress Notes (Signed)
[facility] OFFICE PROGRESS NOTE  Patient Care Team: Anthony Euler, MD as PCP - General (Family Medicine) Anthony Mayer, MD as Consulting Physician (Gastroenterology) Anthony Commons, MD as Consulting Physician (Cardiology) Anthony Lark, MD as Consulting Physician (Hematology and Oncology) Anthony Brownie, MD as Consulting Physician (Dermatology)  SUMMARY OF ONCOLOGIC HISTORY: Oncology History   Low grade B-cell lymphoma   Primary site: Lymphoid Neoplasms   Staging method: AJCC 6th Edition   Clinical: Stage IV signed by Anthony Lark, MD on 09/26/2013  9:15 AM   Summary: Stage IV        History of B-cell lymphoma   12/10/2003 Surgery    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      12/12/2003 - 06/01/2004 Chemotherapy    He was treated with R. CHOP chemotherapy which show complete remission. The number of cycles of R. CHOP chemotherapy was unknown.      12/19/2006 Surgery    Lung resection show follicular lymphoma.      01/02/2007 - 09/01/2008 Chemotherapy    The patient was treated with single agent rituximab alone.      01/19/2007 Bone Marrow Biopsy    Bone marrow biopsy was negative.      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      05/08/2013 - 05/11/2013 Hospital Admission    The patient was admitted to the hospital for management of pericarditis. CT scan showed extensive lymphadenopathy.      06/07/2013 Imaging    PET/CT scan showed extensive lymphadenopathy      06/25/2013 Procedure    He has placement of Infuse-a-Port.      06/28/2013 Bone Marrow Biopsy    Bone marrow biopsy is positive for lymphoma involvement.      07/04/2013 - 11/22/2013 Chemotherapy    He is treated with 6 cycles of bendamustine with rituximab.      09/24/2013 Imaging    Repeat PET scan show complete remission.      12/26/2013 Imaging    PEt scan showed complete remission      09/26/2014 Imaging    CT scan  of the chest abdomen and pelvis show no evidence of disease      03/26/2015 Imaging    CT scan showed no evidence of lymphoma       INTERVAL HISTORY: Please see below for problem oriented charting. He feels well. No new lymphadenopathy. Denies recent infection. He has minor back pain, stable.  REVIEW OF SYSTEMS:   Constitutional: Denies fevers, chills or abnormal weight loss Eyes: Denies blurriness of vision Ears, nose, mouth, throat, and face: Denies mucositis or sore throat Respiratory: Denies cough, dyspnea or wheezes Cardiovascular: Denies palpitation, chest discomfort or lower extremity swelling Gastrointestinal:  Denies nausea, heartburn or change in bowel habits Skin: Denies abnormal skin rashes Lymphatics: Denies new lymphadenopathy or easy bruising Neurological:Denies numbness, tingling or new weaknesses Behavioral/Psych: Mood is stable, no new changes  All other systems were reviewed with the patient and are negative.  I have reviewed the past medical history, past surgical history, social history and family history with the patient and they are unchanged from previous note.  ALLERGIES:  has No Known Allergies.  MEDICATIONS:  Current Outpatient Prescriptions  Medication Sig Dispense Refill  . aspirin EC 81 MG tablet Take 81 mg by mouth every morning.    Marland Kitchen atorvastatin (LIPITOR) 10 MG tablet Take 1 tablet (10 mg total) by mouth every evening. [place]  tablet 3  . diltiazem (CARDIZEM CD) 120 MG 24 hr capsule TAKE 1 CAPSULE EVERY MORNING 90 capsule 2  . Multiple Vitamin (MULTIVITAMIN WITH MINERALS) TABS tablet Take 1 tablet by mouth every morning.     Marland Kitchen omeprazole (PRILOSEC) 20 MG capsule Take 20 mg by mouth daily.    . Probiotic Product (PROBIOTIC DAILY PO) Take 1 capsule by mouth 3 (three) times daily.     . tamsulosin (FLOMAX) 0.4 MG CAPS capsule Take 2 capsules (0.8 mg total) by mouth daily after supper. 180 capsule 3  . TOPROL XL 25 MG 24 hr tablet TAKE 1 TABLET DAILY  90 tablet 3  . traMADol (ULTRAM) 50 MG tablet Take 1 tablet (50 mg total) by mouth every 8 (eight) hours as needed. 30 tablet 0   No current facility-administered medications for this visit.     PHYSICAL EXAMINATION: ECOG PERFORMANCE STATUS: 0 - Asymptomatic  Vitals:   10/16/15 1125  BP: 110/77  Pulse: 80  Resp: 18  Temp: 97.9 F (36.6 C)   Filed Weights   10/16/15 1125  Weight: 195 lb 5 oz (88.6 kg)    GENERAL:alert, no distress and comfortable SKIN: skin color, texture, turgor are normal, no rashes or significant lesions EYES: normal, Conjunctiva are pink and non-injected, sclera clear OROPHARYNX:no exudate, no erythema and lips, buccal mucosa, and tongue normal  NECK: supple, thyroid normal size, non-tender, without nodularity LYMPH:  no palpable lymphadenopathy in the cervical, axillary or inguinal LUNGS: clear to auscultation and percussion with normal breathing effort HEART: regular rate & rhythm and no murmurs and no lower extremity edema ABDOMEN:abdomen soft, non-tender and normal bowel sounds Musculoskeletal:no cyanosis of digits and no clubbing  NEURO: alert & oriented x 3 with fluent speech, no focal motor/sensory deficits  LABORATORY DATA:  I have reviewed the data as listed    Component Value Date/Time   NA 141 10/16/2015 1115   K 4.9 10/16/2015 1115   CL 101 08/19/2014 0921   CL 104 03/24/2012 1024   CO2 27 10/16/2015 1115   GLUCOSE 122 10/16/2015 1115   GLUCOSE 80 03/24/2012 1024   BUN 13.3 10/16/2015 1115   CREATININE 1.2 10/16/2015 1115   CALCIUM 9.7 10/16/2015 1115   PROT 6.6 10/16/2015 1115   ALBUMIN 3.7 10/16/2015 1115   AST 21 10/16/2015 1115   ALT 24 10/16/2015 1115   ALKPHOS 70 10/16/2015 1115   BILITOT 0.65 10/16/2015 1115   GFRNONAA 76 08/19/2014 0921   GFRAA 88 08/19/2014 0921    No results found for: SPEP, UPEP  Lab Results  Component Value Date   WBC 5.9 10/16/2015   NEUTROABS 4.0 10/16/2015   HGB 15.8 10/16/2015   HCT 45.2  10/16/2015   MCV 87.9 10/16/2015   PLT 144 10/16/2015      Chemistry      Component Value Date/Time   NA 141 10/16/2015 1115   K 4.9 10/16/2015 1115   CL 101 08/19/2014 0921   CL 104 03/24/2012 1024   CO2 27 10/16/2015 1115   BUN 13.3 10/16/2015 1115   CREATININE 1.2 10/16/2015 1115      Component Value Date/Time   CALCIUM 9.7 10/16/2015 1115   ALKPHOS 70 10/16/2015 1115   AST 21 10/16/2015 1115   ALT 24 10/16/2015 1115   BILITOT 0.65 10/16/2015 1115     ASSESSMENT & PLAN:  History of B-cell lymphoma Clinically, I detected no signs or symptoms to suggest recurrence of lymphoma. I plan to see him  back in 6 months for repeat history, physical examination, imaging study and blood work.   Preventive measure We discussed increase physical activity  We discussed importance of vitamin D supplementation. We discussed the importance of preventive care and reviewed the vaccination programs. He does not have any prior allergic reactions to influenza vaccination. He agrees to proceed with influenza vaccination today and we will administer it today at the clinic.   Osteopenia He has mild osteopenia. I reinforced the importance of vitamin D, calcium supplement and exercise as tolerated. I recommend avoiding testosterone replacement therapy due to strong family history of prostate cancer in his brother and evidence of prostate enlargement on recent CT.   Orders Placed This Encounter  Procedures  . CT CHEST W CONTRAST    Standing Status:   Future    Standing Expiration Date:   11/19/2016    Order Specific Question:   Reason for exam:    Answer:   staging lymphoma, exclude recurrence    Order Specific Question:   Preferred imaging location?    Answer:   [facility]  . CT ABDOMEN PELVIS W CONTRAST    Standing Status:   Future    Standing Expiration Date:   11/19/2016    Order Specific Question:   Reason for exam:    Answer:   staging lymphoma, exclude recurrence    Order  Specific Question:   Preferred imaging location?    Answer:   [facility]  . CBC with Differential/Platelet    Standing Status:   Future    Standing Expiration Date:   11/19/2016  . Comprehensive metabolic panel    Standing Status:   Future    Standing Expiration Date:   11/19/2016   All questions were answered. The patient knows to call the clinic with any problems, questions or concerns. No barriers to learning was detected. I spent 15 minutes counseling the patient face to face. The total time spent in the appointment was 20 minutes and more than 50% was on counseling and review of test results     Anthony Lark, MD 10/16/2015 12:10 PM

## 2015-10-16 NOTE — Telephone Encounter (Signed)
Gave patient avs report and appointments for April. Central radiology will call re scan.  °

## 2015-10-16 NOTE — Assessment & Plan Note (Signed)
We discussed increase physical activity  We discussed importance of vitamin D supplementation. We discussed the importance of preventive care and reviewed the vaccination programs. He does not have any prior allergic reactions to influenza vaccination. He agrees to proceed with influenza vaccination today and we will administer it today at the clinic.

## 2015-10-16 NOTE — Assessment & Plan Note (Signed)
Clinically, I detected no signs or symptoms to suggest recurrence of lymphoma. I plan to see him back in 6 months for repeat history, physical examination, imaging study and blood work.

## 2015-11-14 ENCOUNTER — Telehealth: Payer: Self-pay | Admitting: Family Medicine

## 2015-11-14 NOTE — Telephone Encounter (Signed)
Patient is having LLQ pain started today, no fever, chills or nausea.  Wanted to be seen today by Dr. Wendi Snipes but no available appointments.  I offered appointment today at 4:15 with MMM but patient wanted to wait and be seen on Monday by Dr. Wendi Snipes.  Appt made 11/17/15 at 12:55 pm

## 2015-11-17 ENCOUNTER — Ambulatory Visit: Payer: Medicare Other | Admitting: Family Medicine

## 2015-11-17 ENCOUNTER — Other Ambulatory Visit: Payer: Self-pay | Admitting: Family Medicine

## 2015-11-18 ENCOUNTER — Encounter: Payer: Self-pay | Admitting: Family Medicine

## 2015-11-25 ENCOUNTER — Other Ambulatory Visit: Payer: Self-pay | Admitting: Hematology and Oncology

## 2015-11-26 DIAGNOSIS — H2512 Age-related nuclear cataract, left eye: Secondary | ICD-10-CM | POA: Diagnosis not present

## 2015-11-26 DIAGNOSIS — H5210 Myopia, unspecified eye: Secondary | ICD-10-CM | POA: Diagnosis not present

## 2015-11-26 DIAGNOSIS — H353131 Nonexudative age-related macular degeneration, bilateral, early dry stage: Secondary | ICD-10-CM | POA: Diagnosis not present

## 2015-11-26 DIAGNOSIS — H40013 Open angle with borderline findings, low risk, bilateral: Secondary | ICD-10-CM | POA: Diagnosis not present

## 2015-11-26 DIAGNOSIS — Z961 Presence of intraocular lens: Secondary | ICD-10-CM | POA: Diagnosis not present

## 2015-12-04 ENCOUNTER — Other Ambulatory Visit: Payer: Self-pay | Admitting: Cardiovascular Disease

## 2016-01-22 ENCOUNTER — Ambulatory Visit: Payer: Medicare Other | Admitting: Family Medicine

## 2016-01-23 ENCOUNTER — Encounter: Payer: Self-pay | Admitting: Family Medicine

## 2016-01-23 ENCOUNTER — Ambulatory Visit (INDEPENDENT_AMBULATORY_CARE_PROVIDER_SITE_OTHER): Payer: Medicare Other | Admitting: Family Medicine

## 2016-01-23 VITALS — BP 115/80 | HR 81 | Temp 96.7°F | Ht 74.0 in | Wt 202.2 lb

## 2016-01-23 DIAGNOSIS — M5136 Other intervertebral disc degeneration, lumbar region: Secondary | ICD-10-CM

## 2016-01-23 DIAGNOSIS — Z8572 Personal history of non-Hodgkin lymphomas: Secondary | ICD-10-CM

## 2016-01-23 DIAGNOSIS — E785 Hyperlipidemia, unspecified: Secondary | ICD-10-CM | POA: Diagnosis not present

## 2016-01-23 DIAGNOSIS — M545 Low back pain: Secondary | ICD-10-CM | POA: Diagnosis not present

## 2016-01-23 DIAGNOSIS — G8929 Other chronic pain: Secondary | ICD-10-CM

## 2016-01-23 DIAGNOSIS — E663 Overweight: Secondary | ICD-10-CM | POA: Diagnosis not present

## 2016-01-23 MED ORDER — DULOXETINE HCL 30 MG PO CPEP: ORAL_CAPSULE | ORAL | 0 refills | Status: DC

## 2016-01-23 MED ORDER — HYDROCODONE-ACETAMINOPHEN 5-325 MG PO TABS: 1.0000 | ORAL_TABLET | Freq: Four times a day (QID) | ORAL | 0 refills | Status: DC | PRN

## 2016-01-23 NOTE — Progress Notes (Signed)
HPI  Patient presents today *for follow-up chronic medical conditions as well as back pain.  Hyperlipidemia Good medication compliance, no side effects from medication Patient is fasting today, requesting labs.  Low back pain Patient has chronic back pain since he was 71 years old. He states over the last 6 months it has been steadily worsening and it's much worse over the last few weeks. He denies any bowel or bladder dysfunction, saddle anesthesia, or leg weakness. He has screening CT scans to monitor for history of B-cell lymphoma. There has been documented degenerative disc disease on the CT scan. Tramadol does not help the pain much, previously ibuprofen, 2400 mg daily, did well.  History of B-cell lymphoma Patient has good follow-up with oncology, no suspicion for recurrence currently. He has a screening CT scan ordered and scheduled for April of this year.  PMH: Smoking status noted ROS: Per HPI  Objective: BP 115/80   Pulse 81   Temp (!) 96.7 F (35.9 C) (Oral)   Ht '6\' 2"'  (1.88 m)   Wt 202 lb 3.2 oz (91.7 kg)   BMI 25.96 kg/m  Gen: NAD, alert, cooperative with exam HEENT: NCAT CV: RRR, good S1/S2, no murmur Resp: CTABL, no wheezes, non-labored Ext: No edema, warm Neuro: Alert and oriented, strength 5/5 and sensation intact in bilateral lower extremities, 1+ patellar tendon reflexes bilaterally  MSK Mild tenderness to palpation of bilateral lumbar paraspinal muscles, no midline tenderness  Plain film previously shows degenerative disc disease at L5/S1, confirmed also with previous CT scans.  Assessment and plan:  # Chronic bilateral low back pain without sciatica With history of DDD and B-cell lymphoma My concern was steadily worsening recent back pain is possible recurrence with malignant spread. MRI is necessary to see soft tissue changes in the spinal cord and nerves, I agree with the screening CT scan but I think for focused evaluation of his back MRI is  necessary. Patient has failed her than 3 months of insert a therapy involving over-the-counter Tylenol, tramadol  Starting Cymbalta today, 30 mg, titrate to 60 mg in 2 weeks, also given a small amount of hydrocodone  Patient denies suicidal thoughts or depression of any type.  # HLD Repeating labs today Continue atorvastatin.  # History of B-cell lymphoma Stable, has good follow-up with oncology History reviewed in detail  Overweight Activity limited by back pain, monitor.  worsening   Orders Placed This Encounter  Procedures  . MR Lumbar Spine Wo Contrast    Standing Status:   Future    Standing Expiration Date:   03/22/2017    Order Specific Question:   Reason for Exam (SYMPTOM  OR DIAGNOSIS REQUIRED)    Answer:   Chronic low back pain    Order Specific Question:   Preferred imaging location?    Answer:   [facility] (table limit-350lbs)    Order Specific Question:   What is the patient's sedation requirement?    Answer:   No Sedation    Order Specific Question:   Does the patient have a pacemaker or implanted devices?    Answer:   No  . CMP14+EGFR  . Lipid panel    Meds ordered this encounter  Medications  . HYDROcodone-acetaminophen (NORCO) 5-325 MG tablet    Sig: Take 1 tablet by mouth every 6 (six) hours as needed for moderate pain.    Dispense:  30 tablet    Refill:  0  . DULoxetine (CYMBALTA) 30 MG capsule  Sig: 1 pill daily for 2 weeks, then increase to 2 pills daily.    Dispense:  42 capsule    Refill:  [place], MD [place] 01/23/2016, 10:02 AM

## 2016-01-23 NOTE — Patient Instructions (Signed)
Great to see you!  We will work on an MRI for your back pain

## 2016-01-24 LAB — CMP14+EGFR
ALBUMIN: 4.3 g/dL (ref 3.5–4.8)
ALK PHOS: 68 IU/L (ref 39–117)
ALT: 21 IU/L (ref 0–44)
AST: 20 IU/L (ref 0–40)
Albumin/Globulin Ratio: 2 (ref 1.2–2.2)
BILIRUBIN TOTAL: 0.5 mg/dL (ref 0.0–1.2)
BUN / CREAT RATIO: 12 (ref 10–24)
BUN: 13 mg/dL (ref 8–27)
CO2: 24 mmol/L (ref 18–29)
CREATININE: 1.11 mg/dL (ref 0.76–1.27)
Calcium: 9.4 mg/dL (ref 8.6–10.2)
Chloride: 101 mmol/L (ref 96–106)
GFR calc non Af Amer: 66 mL/min/{1.73_m2} (ref >59)
GFR, EST AFRICAN AMERICAN: 77 mL/min/{1.73_m2} (ref >59)
GLUCOSE: 98 mg/dL (ref 65–99)
Globulin, Total: 2.2 g/dL (ref 1.5–4.5)
Potassium: 4.8 mmol/L (ref 3.5–5.2)
SODIUM: 140 mmol/L (ref 134–144)
TOTAL PROTEIN: 6.5 g/dL (ref 6.0–8.5)

## 2016-01-24 LAB — LIPID PANEL
CHOLESTEROL TOTAL: 100 mg/dL (ref 100–199)
Chol/HDL Ratio: 2.3 ratio units (ref 0.0–5.0)
HDL: 43 mg/dL (ref >39)
LDL CALC: 32 mg/dL (ref 0–99)
Triglycerides: 125 mg/dL (ref 0–149)
VLDL CHOLESTEROL CAL: 25 mg/dL (ref 5–40)

## 2016-02-04 ENCOUNTER — Ambulatory Visit (HOSPITAL_COMMUNITY)
Admission: RE | Admit: 2016-02-04 | Discharge: 2016-02-04 | Disposition: A | Discharge status: Home / Self Care | Payer: Medicare Other | Source: Ambulatory Visit | Attending: Family Medicine | Admitting: Family Medicine

## 2016-02-04 DIAGNOSIS — M1288 Other specific arthropathies, not elsewhere classified, other specified site: Secondary | ICD-10-CM | POA: Insufficient documentation

## 2016-02-04 DIAGNOSIS — G8929 Other chronic pain: Secondary | ICD-10-CM | POA: Diagnosis not present

## 2016-02-04 DIAGNOSIS — M5126 Other intervertebral disc displacement, lumbar region: Secondary | ICD-10-CM | POA: Diagnosis not present

## 2016-02-04 DIAGNOSIS — M4316 Spondylolisthesis, lumbar region: Secondary | ICD-10-CM | POA: Insufficient documentation

## 2016-02-04 DIAGNOSIS — M47816 Spondylosis without myelopathy or radiculopathy, lumbar region: Secondary | ICD-10-CM | POA: Diagnosis not present

## 2016-02-04 DIAGNOSIS — M545 Low back pain: Secondary | ICD-10-CM | POA: Insufficient documentation

## 2016-02-04 DIAGNOSIS — Z8572 Personal history of non-Hodgkin lymphomas: Secondary | ICD-10-CM | POA: Diagnosis not present

## 2016-02-04 DIAGNOSIS — M5136 Other intervertebral disc degeneration, lumbar region: Secondary | ICD-10-CM | POA: Diagnosis not present

## 2016-02-13 ENCOUNTER — Encounter: Payer: Self-pay | Admitting: Family Medicine

## 2016-02-13 ENCOUNTER — Ambulatory Visit (INDEPENDENT_AMBULATORY_CARE_PROVIDER_SITE_OTHER): Payer: Medicare Other | Admitting: Family Medicine

## 2016-02-13 VITALS — BP 116/71 | HR 73 | Temp 97.0°F | Ht 74.0 in | Wt 201.8 lb

## 2016-02-13 DIAGNOSIS — M5136 Other intervertebral disc degeneration, lumbar region: Secondary | ICD-10-CM

## 2016-02-13 MED ORDER — DULOXETINE HCL 30 MG PO CPEP: 30.0000 mg | ORAL_CAPSULE | Freq: Every day | ORAL | 3 refills | Status: DC

## 2016-02-13 NOTE — Progress Notes (Signed)
   HPI  Patient presents today for follow-up for back pain.  Patient was started on Cymbalta 3-4 weeks ago and has done very well. He reports his back pain is "100% better". He denies any suicidal thoughts, change in mood, or side effects.  We reviewed his recent MRI.  He started exercising.   PMH: Smoking status noted ROS: Per HPI  Objective: BP 116/71   Pulse 73   Temp 97 F (36.1 C) (Oral)   Ht '6\' 2"'$  (1.88 m)   Wt 201 lb 12.8 oz (91.5 kg)   BMI 25.91 kg/m  Gen: NAD, alert, cooperative with exam HEENT: NCAT CV: RRR, good S1/S2, no murmur Resp: CTABL, no wheezes, non-labored Ext: No edema, warm Neuro: Alert and oriented, No gross deficits  Assessment and plan:  # DDD, back pain Doing very well on cymbalta, Continue 30 mg once daily  RTC in 3-4 months  Reviewed exercises, recommend cardio+ Core strengthening Exercise   Meds ordered this encounter  Medications  . DULoxetine (CYMBALTA) 30 MG capsule    Sig: Take 1 capsule (30 mg total) by mouth daily.    Dispense:  90 capsule    Refill:  [place], MD [place] 02/13/2016, 8:36 AM

## 2016-02-13 NOTE — Patient Instructions (Signed)
Great to see you!  Come back in 3-4 months unless you need us sooner.    

## 2016-03-31 ENCOUNTER — Encounter: Payer: Self-pay | Admitting: Cardiovascular Disease

## 2016-03-31 ENCOUNTER — Ambulatory Visit (INDEPENDENT_AMBULATORY_CARE_PROVIDER_SITE_OTHER): Payer: Medicare Other | Admitting: Cardiovascular Disease

## 2016-03-31 VITALS — BP 99/71 | HR 76 | Ht 74.0 in | Wt 201.0 lb

## 2016-03-31 DIAGNOSIS — I480 Paroxysmal atrial fibrillation: Secondary | ICD-10-CM

## 2016-03-31 DIAGNOSIS — I319 Disease of pericardium, unspecified: Secondary | ICD-10-CM | POA: Diagnosis not present

## 2016-03-31 DIAGNOSIS — I519 Heart disease, unspecified: Secondary | ICD-10-CM | POA: Diagnosis not present

## 2016-03-31 DIAGNOSIS — I5189 Other ill-defined heart diseases: Secondary | ICD-10-CM

## 2016-03-31 DIAGNOSIS — R002 Palpitations: Secondary | ICD-10-CM

## 2016-03-31 DIAGNOSIS — I2510 Atherosclerotic heart disease of native coronary artery without angina pectoris: Secondary | ICD-10-CM

## 2016-03-31 NOTE — Patient Instructions (Signed)

## 2016-03-31 NOTE — Progress Notes (Signed)
SUBJECTIVE: Mr. Freiberger returns for routine follow up. He has been doing very well and denies chest pain, shortness of breath, palpitations, leg swelling, lightheadedness, and dizziness. He has known coronary artery calcifications seen on CT (most recently 03/26/15), but had no perfusion defects with no evidence of ischemia by nuclear stress testing on 05/11/13.  He denies exertional chest pain and shortness of breath. He very seldom has palpitations.  He walks 3-3.5 miles on a treadmill 5-7 days a week.   Review of Systems: As per "subjective", otherwise negative.  No Known Allergies  Current Outpatient Prescriptions  Medication Sig Dispense Refill  . aspirin EC 81 MG tablet Take 81 mg by mouth every morning.    Marland Kitchen atorvastatin (LIPITOR) 10 MG tablet TAKE 1 TABLET EVERY EVENING 90 tablet 1  . diltiazem (CARDIZEM CD) 120 MG 24 hr capsule TAKE 1 CAPSULE EVERY MORNING 90 capsule 2  . DULoxetine (CYMBALTA) 30 MG capsule Take 1 capsule (30 mg total) by mouth daily. 90 capsule 3  . Multiple Vitamin (MULTIVITAMIN WITH MINERALS) TABS tablet Take 1 tablet by mouth every morning.     Marland Kitchen omeprazole (PRILOSEC) 20 MG capsule TAKE 1 CAPSULE DAILY 90 capsule 2  . Probiotic Product (PROBIOTIC DAILY PO) Take 1 capsule by mouth 3 (three) times daily.     . tamsulosin (FLOMAX) 0.4 MG CAPS capsule Take 2 capsules (0.8 mg total) by mouth daily after supper. 180 capsule 3  . TOPROL XL 25 MG 24 hr tablet TAKE 1 TABLET DAILY 90 tablet 3   No current facility-administered medications for this visit.     Past Medical History:  Diagnosis Date  . Acute pericarditis    a. 04/2013 -adm with CP, elevated CRP. H/o coronary artery calcification on prior CT but nuc was negative, EF 71%.  . Arthritis   . Atrial fibrillation ([place])    a. Isolated episode in the setting of acute pericarditis 04/2013. Was not placed on anticoag.  Marland Kitchen BPH (benign prostatic hyperplasia)   . Cataract   . Diverticulitis 08/16/2013  .  Hyperlipidemia    elevated triglycerides  . 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 Pain in joint, pelvic region and thigh 08/01/2013  . PSVT (paroxysmal supraventricular tachycardia) ([place])     Past Surgical History:  Procedure Laterality Date  . CHOLECYSTECTOMY    . EXPLORATORY LAPAROTOMY    . EYE SURGERY  2016   cataract  . LUNG LOBECTOMY     right side  . LYMPH NODE BIOPSY     in groin with removal  . MENISCUS REPAIR     right knee  . PROSTATE SURGERY    . REFRACTIVE SURGERY Right    piece of metal removed  . SUBMANDIBULAR GLAND EXCISION  04/2011  . SUBMANDIBULAR GLAND EXCISION  04/30/2011   Procedure: EXCISION SUBMANDIBULAR GLAND;  Surgeon: Jerrell Belfast, MD;  Location: [facility];  Service: ENT;  Laterality: Left;  WITH DIAGNOSTIC BIOPSY  . TONSILLECTOMY     as a child  . VEIN LIGATION AND STRIPPING     right leg  Social History   Social History  . Marital status: Married    Spouse name: N/A  . Number of children: N/A  . Years of education: N/A   Occupational History  . Not on file.   Social History Main Topics  . Smoking status: Former Smoker    Packs/day: 1.50    Years: 25.00    Types: Cigarettes    Start date: 12/12/1962    Quit date: 01/11/1990  . Smokeless tobacco: Never Used  . Alcohol use No  . Drug use: No  . Sexual activity: Not Currently   Other Topics Concern  . Not on file   Social History Narrative  . No narrative on file     Vitals:   03/31/16 0851  BP: 99/71  Pulse: 76  Weight: 201 lb (91.2 kg)  Height: '6\' 2"'$  (1.88 m)    PHYSICAL EXAM General: NAD HEENT:  Normal. Neck: No JVD, no thyromegaly. Lungs: Clear to auscultation bilaterally with normal respiratory effort. CV: Nondisplaced PMI.  Regular rate and rhythm, normal S1/S2, no S3/S4, no murmur. No pretibial or periankle edema.  No carotid bruit.   Abdomen: Soft, nontender, no distention.  Neurologic: Alert and oriented.  Psych: Normal affect. Skin: Normal. Musculoskeletal: No gross deformities.    ECG: Most recent ECG reviewed.      ASSESSMENT AND PLAN: 1. Pericarditis: No recurrence of symptoms.  2. PSVT/PAF: No recurrences. This was reportedly an isolated episode in the setting of acute pericarditis. Thus, was not placed on anticoagulation. Continue long-acting diltiazem and long-acting metoprolol.   3. Coronary artery calcifications on CT: Symptomatically stable. Normal nuclear MPI study on 05/11/13, thus no further testing is indicated at this time. Continue ASA, beta blocker, and statin therapy.  4. Grade II diastolic dysfunction: No evidence of heart failure. BP is normal.  Dispo: fu 1 year.   Kate Sable, M.D., F.A.C.C.

## 2016-04-14 ENCOUNTER — Other Ambulatory Visit (HOSPITAL_BASED_OUTPATIENT_CLINIC_OR_DEPARTMENT_OTHER): Payer: Medicare Other

## 2016-04-14 ENCOUNTER — Ambulatory Visit (HOSPITAL_COMMUNITY)
Admission: RE | Admit: 2016-04-14 | Discharge: 2016-04-14 | Disposition: A | Discharge status: Home / Self Care | Payer: Medicare Other | Source: Ambulatory Visit | Attending: Hematology and Oncology | Admitting: Hematology and Oncology

## 2016-04-14 ENCOUNTER — Encounter (HOSPITAL_COMMUNITY): Payer: Self-pay

## 2016-04-14 DIAGNOSIS — Z8572 Personal history of non-Hodgkin lymphomas: Secondary | ICD-10-CM | POA: Diagnosis not present

## 2016-04-14 DIAGNOSIS — I700 Atherosclerosis of aorta: Secondary | ICD-10-CM | POA: Insufficient documentation

## 2016-04-14 DIAGNOSIS — K5730 Diverticulosis of large intestine without perforation or abscess without bleeding: Secondary | ICD-10-CM | POA: Insufficient documentation

## 2016-04-14 DIAGNOSIS — R197 Diarrhea, unspecified: Secondary | ICD-10-CM | POA: Diagnosis not present

## 2016-04-14 DIAGNOSIS — J439 Emphysema, unspecified: Secondary | ICD-10-CM | POA: Diagnosis not present

## 2016-04-14 DIAGNOSIS — K5900 Constipation, unspecified: Secondary | ICD-10-CM | POA: Diagnosis not present

## 2016-04-14 DIAGNOSIS — K449 Diaphragmatic hernia without obstruction or gangrene: Secondary | ICD-10-CM | POA: Diagnosis not present

## 2016-04-14 DIAGNOSIS — Z9049 Acquired absence of other specified parts of digestive tract: Secondary | ICD-10-CM | POA: Diagnosis not present

## 2016-04-14 DIAGNOSIS — N400 Enlarged prostate without lower urinary tract symptoms: Secondary | ICD-10-CM | POA: Diagnosis not present

## 2016-04-14 DIAGNOSIS — R109 Unspecified abdominal pain: Secondary | ICD-10-CM | POA: Diagnosis not present

## 2016-04-14 DIAGNOSIS — N281 Cyst of kidney, acquired: Secondary | ICD-10-CM | POA: Diagnosis not present

## 2016-04-14 DIAGNOSIS — I2510 Atherosclerotic heart disease of native coronary artery without angina pectoris: Secondary | ICD-10-CM | POA: Diagnosis not present

## 2016-04-14 DIAGNOSIS — C8330 Diffuse large B-cell lymphoma, unspecified site: Secondary | ICD-10-CM | POA: Diagnosis not present

## 2016-04-14 DIAGNOSIS — Z9889 Other specified postprocedural states: Secondary | ICD-10-CM | POA: Diagnosis not present

## 2016-04-14 LAB — CBC WITH DIFFERENTIAL/PLATELET
BASO%: 0.7 % (ref 0.0–2.0)
Basophils Absolute: 0.1 10*3/uL (ref 0.0–0.1)
EOS%: 3 % (ref 0.0–7.0)
Eosinophils Absolute: 0.2 10*3/uL (ref 0.0–0.5)
HCT: 45.2 % (ref 38.4–49.9)
HGB: 15.6 g/dL (ref 13.0–17.1)
LYMPH%: 16 % (ref 14.0–49.0)
MCH: 31.2 pg (ref 27.2–33.4)
MCHC: 34.6 g/dL (ref 32.0–36.0)
MCV: 90.1 fL (ref 79.3–98.0)
MONO#: 0.7 10*3/uL (ref 0.1–0.9)
MONO%: 9.4 % (ref 0.0–14.0)
NEUT%: 70.9 % (ref 39.0–75.0)
NEUTROS ABS: 5.3 10*3/uL (ref 1.5–6.5)
PLATELETS: 150 10*3/uL (ref 140–400)
RBC: 5.01 10*6/uL (ref 4.20–5.82)
RDW: 13.1 % (ref 11.0–14.6)
WBC: 7.5 10*3/uL (ref 4.0–10.3)
lymph#: 1.2 10*3/uL (ref 0.9–3.3)

## 2016-04-14 LAB — COMPREHENSIVE METABOLIC PANEL
ALT: 41 U/L (ref 0–55)
ANION GAP: 8 meq/L (ref 3–11)
AST: 28 U/L (ref 5–34)
Albumin: 3.8 g/dL (ref 3.5–5.0)
Alkaline Phosphatase: 71 U/L (ref 40–150)
BILIRUBIN TOTAL: 0.61 mg/dL (ref 0.20–1.20)
BUN: 8.6 mg/dL (ref 7.0–26.0)
CO2: 25 meq/L (ref 22–29)
CREATININE: 1.1 mg/dL (ref 0.7–1.3)
Calcium: 9.4 mg/dL (ref 8.4–10.4)
Chloride: 106 mEq/L (ref 98–109)
EGFR: 66 mL/min/{1.73_m2} — ABNORMAL LOW (ref >90)
GLUCOSE: 111 mg/dL (ref 70–140)
Potassium: 4.6 mEq/L (ref 3.5–5.1)
Sodium: 139 mEq/L (ref 136–145)
TOTAL PROTEIN: 6.5 g/dL (ref 6.4–8.3)

## 2016-04-14 IMAGING — CT CT ABD-PELV W/ CM
2 of 5 series · 12 of 36 positions shown, 15 images · IV contrast (iopamidol)
Comparison: [DATE]

CLINICAL DATA: Restaging of B-cell lymphoma. Upper abdominal pain
for 2 days with diarrhea. Prior right lung surgery.

EXAM:
CT CHEST, ABDOMEN, AND PELVIS WITH CONTRAST
TECHNIQUE: Multidetector CT imaging of the chest, abdomen and pelvis was
performed following the standard protocol during bolus
administration of intravenous contrast.
CONTRAST:  100mL [16] IOPAMIDOL ([16]) INJECTION 61%

[Series 2: cap with · axial · 0.87mm/px · z∈[-639,-89]mm · 9 of 136 slices shown, 12 images]
[im 13/136  mediastinal]
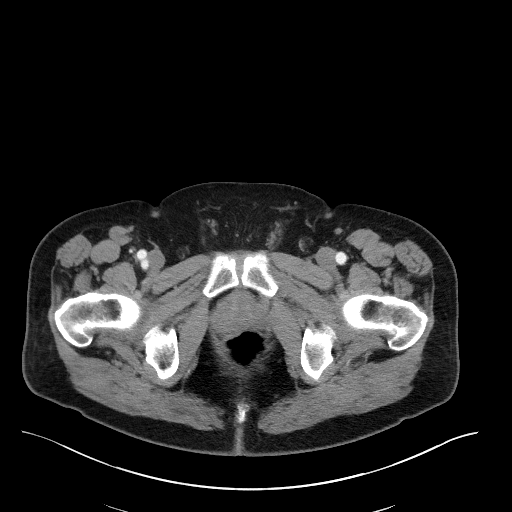
[im 13/136  lung]
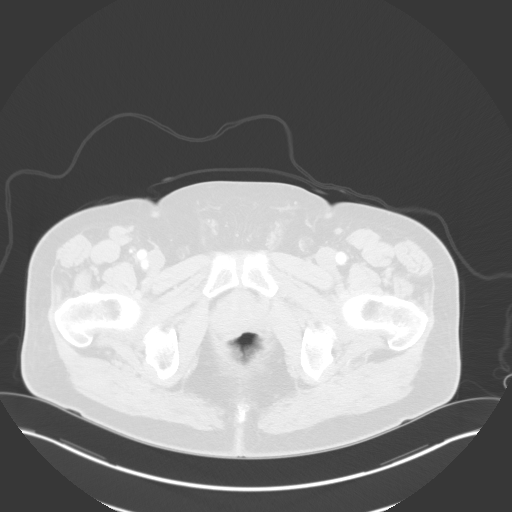
[im 25/136  lung]
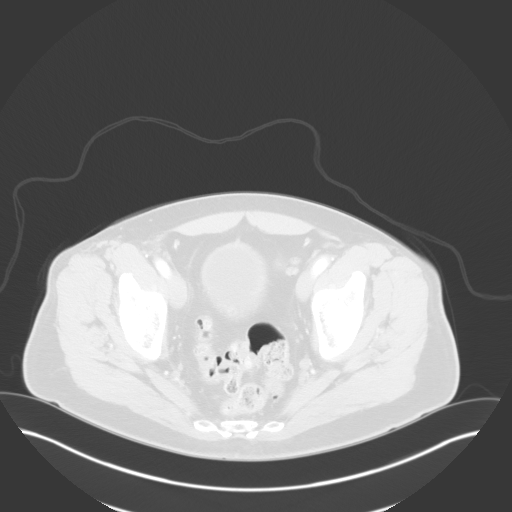
[im 37/136  lung]
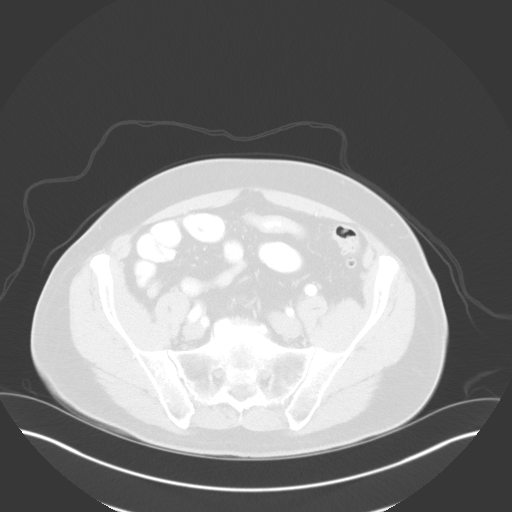
[im 50/136  lung]
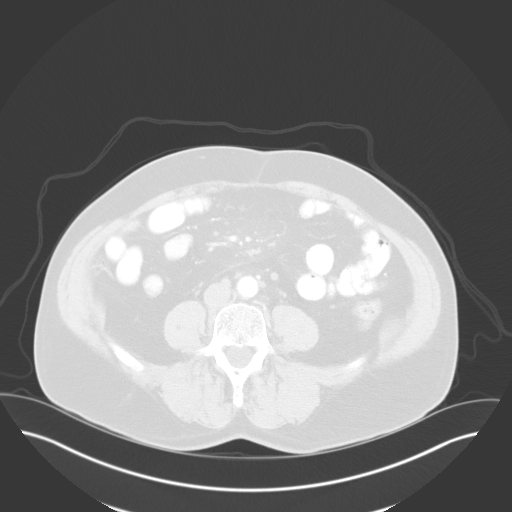
[im 74/136  mediastinal]
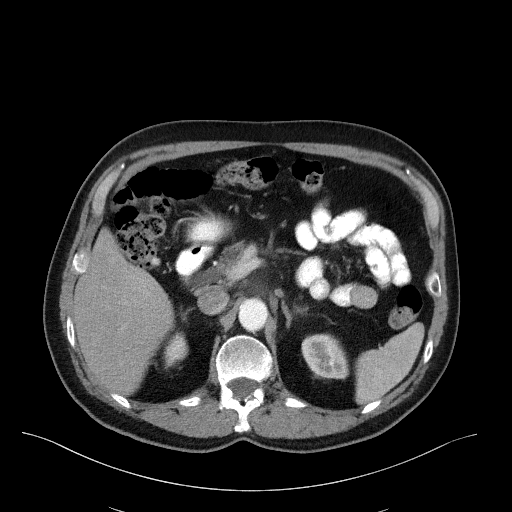
[im 74/136  lung]
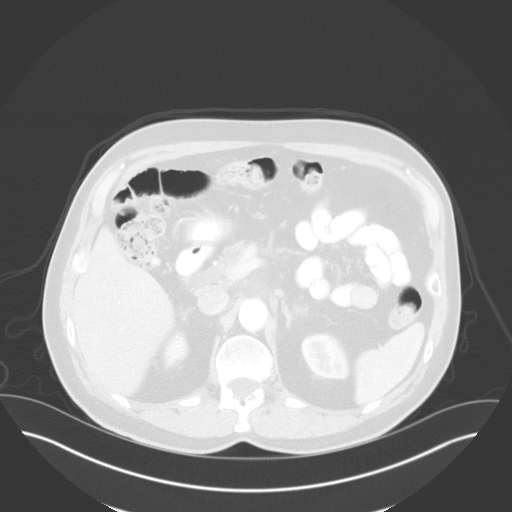
[im 86/136  lung]
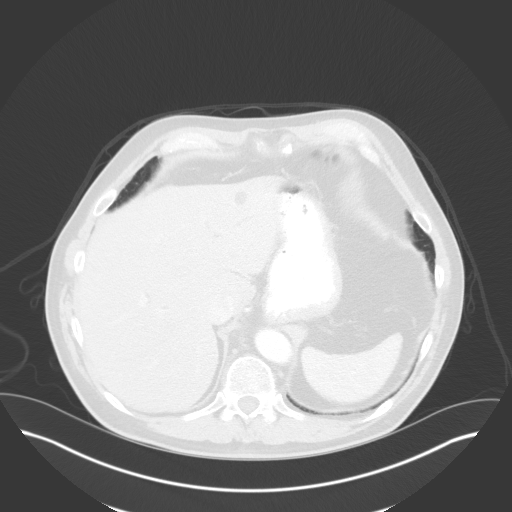
[im 99/136  lung]
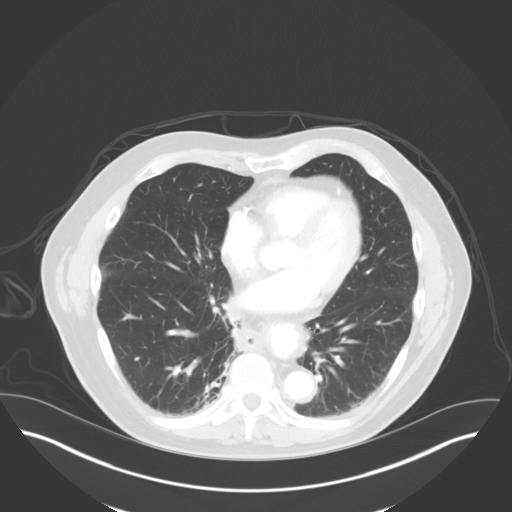
[im 111/136  lung]
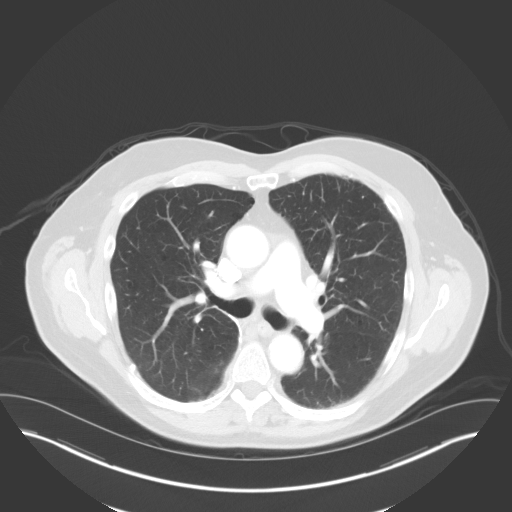
[im 123/136  mediastinal]
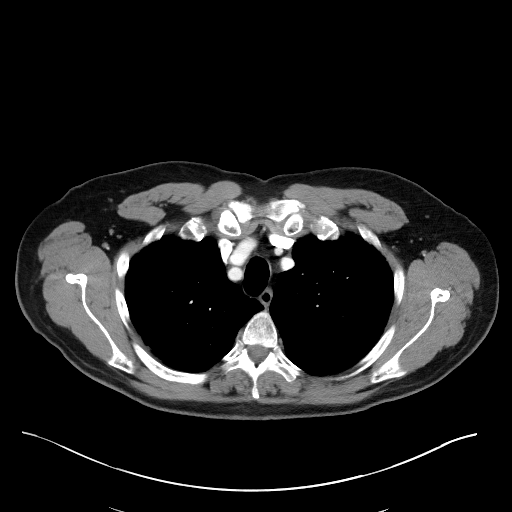
[im 123/136  lung]
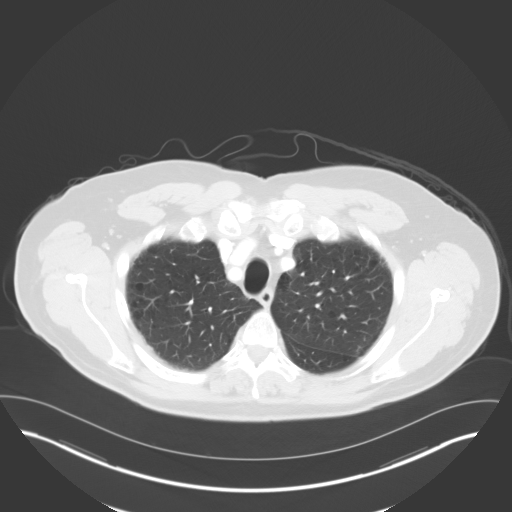

[Series 5: coronals · coronal · 0.83mm/px · 3 of 158 slices shown]
[im 32/158  lung]
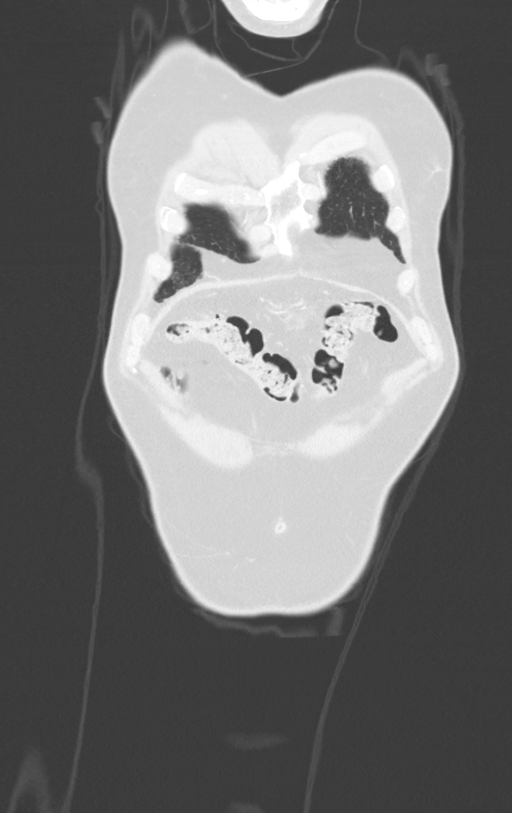
[im 63/158  lung]
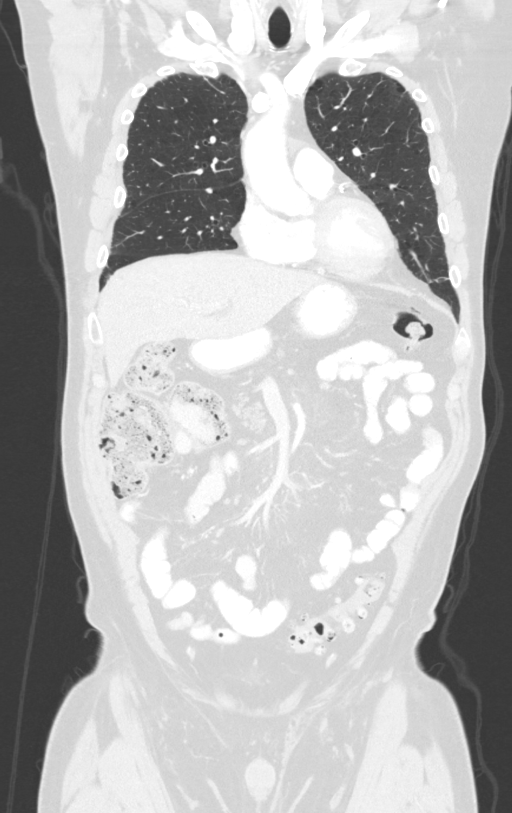
[im 95/158  lung]
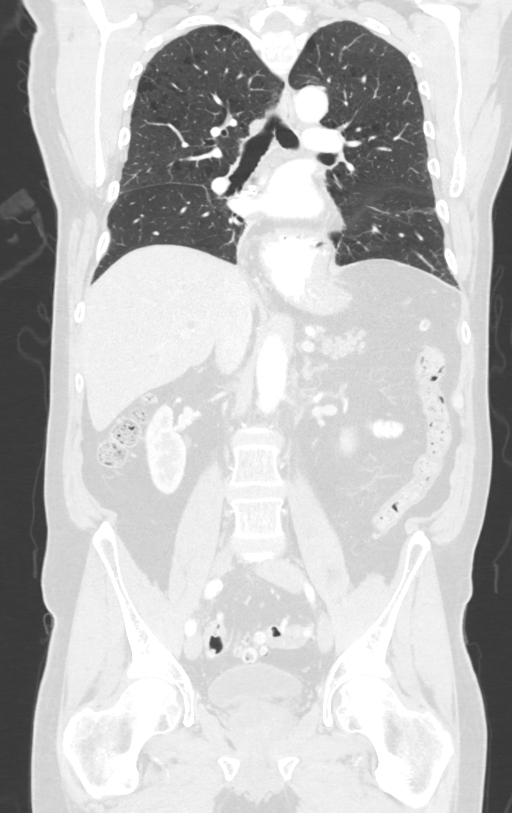

[12 of 36 positions shown; findings below may reference images not displayed]

FINDINGS: CT CHEST FINDINGS

Cardiovascular: Atherosclerotic calcification involving the aortic
arch, left main, left anterior descending, and right coronary
arteries.

Mediastinum/Nodes: Right hilar lymph node 1.0 cm in short axis on
image [DATE], formerly 1.0 cm by my measurement. Low-density
subcarinal node or collection 1.4 cm in short axis on image [DATE],
formerly the same.

Moderate-sized hiatal hernia.

Lungs/Pleura: Centrilobular and paraseptal emphysema. Wedge
resections staple in the right middle lobe and right lower lobe.
Stable scarring in the right lower lobe.

Musculoskeletal: Stable appearance of widening of the right T11-12
neural foramen with a low-density lesion in this vicinity, and
similar findings bilaterally at T12-L1, can be seen in the setting
of meningocele is or less likely schwannoma/neurofibroma (no
hypermetabolic activity on prior PET-CT of [DATE]).

CT ABDOMEN PELVIS FINDINGS

Hepatobiliary: Stable 1.1 cm cyst in segment 2 of the liver.

Mild intrahepatic biliary dilatation. Common hepatic duct 1.6 cm,
common bile duct 1.1 cm. Prior cholecystectomy. Biliary caliber
fairly similar to prior.

Pancreas: Unremarkable

Spleen: Unremarkable

Adrenals/Urinary Tract: Adrenal glands normal. Prostate gland
indents the bladder base. 3.1 cm left kidney lower pole Bosniak
category 1 cyst. Several additional hypodense lesions of the left
kidney are technically too small to characterize but statistically
likely to be benign.

Stomach/Bowel: Sigmoid colon diverticulosis without active
diverticulitis. Prominent stool throughout the colon favors
constipation.

Vascular/Lymphatic: Stranding at the root of the mesentery and in
the central mesentery observed with scattered small lymph nodes. The
stranding at the root of the mesentery is stable for example on
image 64/2, but the low-grade central mesenteric stranding is mildly
increased. A representative lymph node along this mesenteric
stranding measures 8 mm in diameter on image 84/2, previously 6 mm.
No bulky adenopathy identified. Small retroperitoneal lymph nodes
are not pathologically enlarged.

Aortoiliac atherosclerotic vascular disease.

Reproductive: Moderately enlarged prostate gland indents the bladder
base and measures 4.0 by 6.1 by 5.9 cm (volume = 75 cm^3).

Other: No supplemental non-categorized findings.

Musculoskeletal: Degenerative loss of disc height at the L5-S1
level. Endplate sclerosis at L5-S1.
IMPRESSION: 1. Borderline prominent right hilar and subcarinal lymph nodes, but
not appreciably changed.
2. Low-grade but increased central mesenteric stranding with some
small mesenteric lymph nodes. This could certainly be inflammatory,
and there is no bulky adenopathy to suggest a malignant etiology.
3. Coronary and aortoiliac atherosclerotic calcification.
4. Centrilobular and paraseptal emphysema. Postoperative findings in
the right lung.
5. Stable cystic lesions along the T12-L1 and right T11-12 neural
foramina, likely small meningocele is.
6. Stable mild biliary dilatation, much of which is likely a
physiologic response to cholecystectomy.
7. Sigmoid colon diverticulosis.
8.  Prominent stool throughout the colon favors constipation.
9. Enlarged prostate gland, volume estimated at 75 cubic cm.

## 2016-04-14 MED ORDER — IOPAMIDOL (ISOVUE-300) INJECTION 61%
INTRAVENOUS | Status: AC
  Filled 2016-04-14: qty 100

## 2016-04-14 MED ORDER — IOPAMIDOL (ISOVUE-300) INJECTION 61%
100.0000 mL | Freq: Once | INTRAVENOUS | Status: AC | PRN
  Administered 2016-04-14: 100 mL via INTRAVENOUS

## 2016-04-15 ENCOUNTER — Ambulatory Visit (HOSPITAL_BASED_OUTPATIENT_CLINIC_OR_DEPARTMENT_OTHER): Payer: Medicare Other | Admitting: Hematology and Oncology

## 2016-04-15 ENCOUNTER — Encounter: Payer: Self-pay | Admitting: Hematology and Oncology

## 2016-04-15 ENCOUNTER — Telehealth: Payer: Self-pay | Admitting: Hematology and Oncology

## 2016-04-15 VITALS — BP 122/66 | HR 71 | Temp 97.6°F | Resp 18 | Ht 74.0 in | Wt 198.1 lb

## 2016-04-15 DIAGNOSIS — Z8572 Personal history of non-Hodgkin lymphomas: Secondary | ICD-10-CM

## 2016-04-15 DIAGNOSIS — I2510 Atherosclerotic heart disease of native coronary artery without angina pectoris: Secondary | ICD-10-CM

## 2016-04-15 NOTE — Assessment & Plan Note (Signed)
Clinically, I detected no signs or symptoms to suggest recurrence of lymphoma. I plan to see him back in 12 months for repeat history, physical examination, imaging study and blood work.

## 2016-04-15 NOTE — Progress Notes (Signed)
[place] OFFICE PROGRESS NOTE  Patient Care Team: [name], MD as PCP - General (Family Medicine) [name], [name], [name], [name], MD as Consulting Physician (Dermatology)  SUMMARY OF ONCOLOGIC HISTORY: Oncology History   Low grade B-cell lymphoma   Primary site: Lymphoid Neoplasms   Staging method: AJCC 6th Edition   Clinical: Stage IV signed by [name], MD on 09/26/2013  9:15 AM   Summary: Stage IV        History of B-cell lymphoma   12/10/2003 Surgery    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      12/12/2003 - 06/01/2004 Chemotherapy    He was treated with R. CHOP chemotherapy which show complete remission. The number of cycles of R. CHOP chemotherapy was unknown.      12/19/2006 Surgery    Lung resection show follicular lymphoma.      01/02/2007 - 09/01/2008 Chemotherapy    The patient was treated with single agent rituximab alone.      01/19/2007 Bone Marrow Biopsy    Bone marrow biopsy was negative.      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      05/08/2013 - 05/11/2013 Hospital Admission    The patient was admitted to the hospital for management of pericarditis. CT scan showed extensive lymphadenopathy.      06/07/2013 Imaging    PET/CT scan showed extensive lymphadenopathy      06/25/2013 Procedure    He has placement of Infuse-a-Port.      06/28/2013 Bone Marrow Biopsy    Bone marrow biopsy is positive for lymphoma involvement.      07/04/2013 - 11/22/2013 Chemotherapy    He is treated with 6 cycles of bendamustine with rituximab.      09/24/2013 Imaging    Repeat PET scan show complete remission.      12/26/2013 Imaging    PEt scan showed complete remission      09/26/2014 Imaging    CT scan  of the chest abdomen and pelvis show no evidence of disease      03/26/2015 Imaging    CT scan showed no evidence of lymphoma      04/14/2016 Imaging    CT: Borderline prominent right hilar and subcarinal lymph nodes, but not appreciably changed. 2. Low-grade but increased central mesenteric stranding with some small mesenteric lymph nodes. This could certainly be inflammatory, 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, [name], volume estimated at 75 cubic cm.       INTERVAL HISTORY: Please see below for problem oriented charting. He returns for follow-up with his wife The patient is active and exercise regularly He denies new lymphadenopathy Denies recent infection  REVIEW OF SYSTEMS:   Constitutional: Denies fevers, chills or abnormal weight loss Eyes: Denies blurriness of vision Ears, nose, mouth, throat, and face: Denies mucositis or sore throat Respiratory: Denies cough, dyspnea or wheezes Cardiovascular: Denies palpitation, chest discomfort  or lower extremity swelling Gastrointestinal:  Denies nausea, heartburn or change in bowel habits Skin: Denies abnormal skin rashes Lymphatics: Denies new lymphadenopathy or easy bruising Neurological:Denies numbness, tingling or new weaknesses Behavioral/Psych: Mood is stable, no new changes  All other systems were reviewed with the patient and are negative.  I have reviewed the past medical history, past surgical history, social history and family history  with the patient and they are unchanged from previous note.  ALLERGIES:  has No Known Allergies.  MEDICATIONS:  Current Outpatient Prescriptions  Medication Sig Dispense Refill  . aspirin EC 81 MG tablet Take 81 mg by mouth every morning.    Marland Kitchen atorvastatin (LIPITOR) 10 MG tablet TAKE 1 TABLET EVERY EVENING 90 tablet 1  . diltiazem (CARDIZEM CD) 120 MG 24 hr capsule TAKE 1 CAPSULE EVERY MORNING 90 capsule 2  . DULoxetine (CYMBALTA) 30 MG capsule Take 1 capsule (30 mg total) by mouth daily. 90 capsule 3  . Multiple Vitamin (MULTIVITAMIN WITH MINERALS) TABS tablet Take 1 tablet by mouth every morning.     Marland Kitchen omeprazole (PRILOSEC) 20 MG capsule TAKE 1 CAPSULE DAILY 90 capsule 2  . Probiotic Product (PROBIOTIC DAILY PO) Take 1 capsule by mouth 3 (three) times daily.     . tamsulosin (FLOMAX) 0.4 MG CAPS capsule Take 2 capsules (0.8 mg total) by mouth daily after supper. 180 capsule 3  . TOPROL XL 25 MG 24 hr tablet TAKE 1 TABLET DAILY 90 tablet 3   No current facility-administered medications for this visit.     PHYSICAL EXAMINATION: ECOG PERFORMANCE STATUS: 0 - Asymptomatic  Vitals:   04/15/16 0952  BP: 122/66  Pulse: 71  Resp: 18  Temp: 97.6 F (36.4 C)   Filed Weights   04/15/16 0952  Weight: 198 lb 1.6 oz (89.9 kg)    GENERAL:alert, no distress and comfortable SKIN: skin color, texture, turgor are normal, no rashes or significant lesions EYES: normal, Conjunctiva are pink and non-injected, sclera clear Musculoskeletal:no cyanosis of digits and no clubbing  NEURO: alert & oriented x 3 with fluent speech, no focal motor/sensory deficits  LABORATORY DATA:  I have reviewed the data as listed    Component Value Date/Time   NA 139 04/14/2016 0943   K 4.6 04/14/2016 0943   CL 101 01/23/2016 1004   CL 104 03/24/2012 1024   CO2 25 04/14/2016 0943   GLUCOSE 111 04/14/2016 0943   GLUCOSE 80 03/24/2012 1024   BUN 8.6 04/14/2016 0943   CREATININE 1.1 04/14/2016 0943   CALCIUM  9.4 04/14/2016 0943   PROT 6.5 04/14/2016 0943   ALBUMIN 3.8 04/14/2016 0943   AST 28 04/14/2016 0943   ALT 41 04/14/2016 0943   ALKPHOS 71 04/14/2016 0943   BILITOT 0.61 04/14/2016 0943   GFRNONAA 66 01/23/2016 1004   GFRAA 77 01/23/2016 1004    No results found for: SPEP, UPEP  Lab Results  Component Value Date   WBC 7.5 04/14/2016   NEUTROABS 5.3 04/14/2016   HGB 15.6 04/14/2016   HCT 45.2 04/14/2016   MCV 90.1 04/14/2016   PLT 150 04/14/2016      Chemistry      Component Value Date/Time   NA 139 04/14/2016 0943   K 4.6 04/14/2016 0943   CL 101 01/23/2016 1004   CL 104 03/24/2012 1024   CO2 25 04/14/2016 0943   BUN 8.6 04/14/2016 0943   CREATININE 1.1 04/14/2016 0943      Component Value Date/Time  CALCIUM 9.4 04/14/2016 0943   ALKPHOS 71 04/14/2016 0943   AST 28 04/14/2016 0943   ALT 41 04/14/2016 0943   BILITOT 0.61 04/14/2016 0943       RADIOGRAPHIC STUDIES: I reviewed all imaging study and blood work with the patient and his wife I have personally reviewed the radiological images as listed and agreed with the findings in the report. Ct Chest W Contrast  Result Date: 04/14/2016 CLINICAL DATA:  Restaging of B-cell lymphoma. Upper abdominal pain for 2 days with diarrhea. Prior right lung surgery. EXAM: CT CHEST, ABDOMEN, AND PELVIS WITH CONTRAST TECHNIQUE: Multidetector CT imaging of the chest, abdomen and pelvis was performed following the standard protocol during bolus administration of intravenous contrast. CONTRAST:  151m ISOVUE-300 IOPAMIDOL (ISOVUE-300) INJECTION 61% COMPARISON:  03/26/2015 FINDINGS: CT CHEST FINDINGS Cardiovascular: Atherosclerotic calcification involving the aortic arch, left main, left anterior descending, and right coronary arteries. Mediastinum/Nodes: Right hilar lymph node 1.0 cm in short axis on image 28/2, formerly 1.0 cm by my measurement. Low-density subcarinal node or collection 1.4 cm in short axis on image 29/2, formerly the  same. Moderate-sized hiatal hernia. Lungs/Pleura: Centrilobular and paraseptal emphysema. Wedge resections staple in the right middle lobe and right lower lobe. Stable scarring in the right lower lobe. Musculoskeletal: Stable appearance of widening of the right T11-12 neural foramen with a low-density lesion in this vicinity, and similar findings bilaterally at T12-L1, can be seen in the setting of meningocele is or less likely schwannoma/neurofibroma (no hypermetabolic activity on prior PET-CT of 12/26/2013). CT ABDOMEN PELVIS FINDINGS Hepatobiliary: Stable 1.1 cm cyst in segment 2 of the liver. Mild intrahepatic biliary dilatation. Common hepatic duct 1.6 cm, common bile duct 1.1 cm. Prior cholecystectomy. Biliary caliber fairly similar to prior. Pancreas: Unremarkable Spleen: Unremarkable Adrenals/Urinary Tract: Adrenal glands normal. Prostate gland indents the bladder base. 3.1 cm left kidney lower pole Bosniak category 1 cyst. Several additional hypodense lesions of the left kidney are technically too small to characterize but statistically likely to be benign. Stomach/Bowel: Sigmoid colon diverticulosis without active diverticulitis. Prominent stool throughout the colon favors constipation. Vascular/Lymphatic: Stranding at the root of the mesentery and in the central mesentery observed with scattered small lymph nodes. The stranding at the root of the mesentery is stable for example on image 64/2, but the low-grade central mesenteric stranding is mildly increased. A representative lymph node along this mesenteric stranding measures 8 mm in diameter on image 84/2, previously 6 mm. No bulky adenopathy identified. Small retroperitoneal lymph nodes are not pathologically enlarged. Aortoiliac atherosclerotic vascular disease. Reproductive: Moderately enlarged prostate gland indents the bladder base and measures 4.0 by 6.1 by 5.9 cm (volume = 75 cm^3). Other: No supplemental non-categorized findings.  Musculoskeletal: Degenerative loss of disc height at the L5-S1 level. Endplate sclerosis at LY0-V3 IMPRESSION: 1. Borderline prominent right hilar and subcarinal lymph nodes, but not appreciably changed. 2. Low-grade but increased central mesenteric stranding with some small mesenteric lymph nodes. This could certainly be inflammatory, and there is no bulky adenopathy to suggest a malignant etiology. 3. Coronary and aortoiliac atherosclerotic calcification. 4. Centrilobular and paraseptal emphysema. Postoperative findings in the right lung. 5. 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, much of which is likely a physiologic response to cholecystectomy. 7. Sigmoid colon diverticulosis. 8.  Prominent stool throughout the colon favors constipation. 9. Enlarged prostate gland, volume estimated at 75 cubic cm. Electronically Signed   By: WVan Clines  M.D.   On: 04/14/2016 11:55   Ct Abdomen Pelvis W Contrast  Result Date: 04/14/2016 CLINICAL DATA:  Restaging of B-cell lymphoma. Upper abdominal pain for 2 days with diarrhea. Prior right lung surgery. EXAM: CT CHEST, ABDOMEN, AND PELVIS WITH CONTRAST TECHNIQUE: Multidetector CT imaging of the chest, abdomen and pelvis was performed following the standard protocol during bolus administration of intravenous contrast. CONTRAST:  181m ISOVUE-300 IOPAMIDOL (ISOVUE-300) INJECTION 61% COMPARISON:  03/26/2015 FINDINGS: CT CHEST FINDINGS Cardiovascular: Atherosclerotic calcification involving the aortic arch, left main, left anterior descending, and right coronary arteries. Mediastinum/Nodes: Right hilar lymph node 1.0 cm in short axis on image 28/2, formerly 1.0 cm by my measurement. Low-density subcarinal node or collection 1.4 cm in short axis on image 29/2, formerly the same. Moderate-sized hiatal hernia. Lungs/Pleura: Centrilobular and paraseptal emphysema. Wedge resections  staple in the right middle lobe and right lower lobe. Stable scarring in the right lower lobe. Musculoskeletal: Stable appearance of widening of the right T11-12 neural foramen with a low-density lesion in this vicinity, and similar findings bilaterally at T12-L1, can be seen in the setting of meningocele is or less likely schwannoma/neurofibroma (no hypermetabolic activity on prior PET-CT of 12/26/2013). CT ABDOMEN PELVIS FINDINGS Hepatobiliary: Stable 1.1 cm cyst in segment 2 of the liver. Mild intrahepatic biliary dilatation. Common hepatic duct 1.6 cm, common bile duct 1.1 cm. Prior cholecystectomy. Biliary caliber fairly similar to prior. Pancreas: Unremarkable Spleen: Unremarkable Adrenals/Urinary Tract: Adrenal glands normal. Prostate gland indents the bladder base. 3.1 cm left kidney lower pole Bosniak category 1 cyst. Several additional hypodense lesions of the left kidney are technically too small to characterize but statistically likely to be benign. Stomach/Bowel: Sigmoid colon diverticulosis without active diverticulitis. Prominent stool throughout the colon favors constipation. Vascular/Lymphatic: Stranding at the root of the mesentery and in the central mesentery observed with scattered small lymph nodes. The stranding at the root of the mesentery is stable for example on image 64/2, but the low-grade central mesenteric stranding is mildly increased. A representative lymph node along this mesenteric stranding measures 8 mm in diameter on image 84/2, previously 6 mm. No bulky adenopathy identified. Small retroperitoneal lymph nodes are not pathologically enlarged. Aortoiliac atherosclerotic vascular disease. Reproductive: Moderately enlarged prostate gland indents the bladder base and measures 4.0 by 6.1 by 5.9 cm (volume = 75 cm^3). Other: No supplemental non-categorized findings. Musculoskeletal: Degenerative loss of disc height at the L5-S1 level. Endplate sclerosis at LO1-H0 IMPRESSION: 1.  Borderline prominent right hilar and subcarinal lymph nodes, but not appreciably changed. 2. Low-grade but increased central mesenteric stranding with some small mesenteric lymph nodes. This could certainly be inflammatory, and there is no bulky adenopathy to suggest a malignant etiology. 3. Coronary and aortoiliac atherosclerotic calcification. 4. Centrilobular and paraseptal emphysema. Postoperative findings in the right lung. 5. 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, much of which is likely a physiologic response to cholecystectomy. 7. Sigmoid colon diverticulosis. 8.  Prominent stool throughout the colon favors constipation. 9. Enlarged prostate gland, volume estimated at 75 cubic cm. Electronically Signed   By: WVan ClinesM.D.   On: 04/14/2016 11:55    ASSESSMENT & PLAN:  History of B-cell lymphoma Clinically, I detected no signs or symptoms to suggest recurrence of lymphoma. I plan to see him back in 12 months for repeat history, physical examination, imaging study and blood work.    Orders Placed This Encounter  Procedures  .  CT CHEST W CONTRAST    Standing Status:   Future    Standing Expiration Date:   07/16/2017    Order Specific Question:   Reason for Exam (SYMPTOM  OR DIAGNOSIS REQUIRED)    Answer:   staging lymphoma, exclude recurrence    Order Specific Question:   Preferred imaging location?    Answer:   [facility]  . CT ABDOMEN PELVIS W CONTRAST    Standing Status:   Future    Standing Expiration Date:   07/16/2017    Order Specific Question:   Reason for Exam (SYMPTOM  OR DIAGNOSIS REQUIRED)    Answer:   staging lymphoma, exclude recurrence    Order Specific Question:   Preferred imaging location?    Answer:   [facility]  . Comprehensive metabolic panel    Standing Status:   Future    Standing Expiration Date:   07/16/2017  . CBC with  Differential/Platelet    Standing Status:   Future    Standing Expiration Date:   07/16/2017  . Lactate dehydrogenase    Standing Status:   Future    Standing Expiration Date:   07/16/2017   All questions were answered. The patient knows to call the clinic with any problems, questions or concerns. No barriers to learning was detected. I spent 15 minutes counseling the patient face to face. The total time spent in the appointment was 20 minutes and more than 50% was on counseling and review of test results     [name], MD 04/15/2016 1:14 PM

## 2016-04-15 NOTE — Telephone Encounter (Signed)
Appointments scheduled per 4.5.18 LOS. Patient given AVS report and calendars with future scheduled appointments.  Two bottles of contrast with instructions given to patient for CT scan appointment.

## 2016-05-23 ENCOUNTER — Other Ambulatory Visit: Payer: Self-pay | Admitting: Family Medicine

## 2016-05-25 ENCOUNTER — Encounter: Payer: Self-pay | Admitting: Family Medicine

## 2016-05-25 ENCOUNTER — Ambulatory Visit (INDEPENDENT_AMBULATORY_CARE_PROVIDER_SITE_OTHER): Payer: Medicare Other | Admitting: Family Medicine

## 2016-05-25 VITALS — BP 116/78 | HR 80 | Temp 96.7°F | Ht 74.0 in | Wt 195.6 lb

## 2016-05-25 DIAGNOSIS — I2510 Atherosclerotic heart disease of native coronary artery without angina pectoris: Secondary | ICD-10-CM

## 2016-05-25 DIAGNOSIS — M79604 Pain in right leg: Secondary | ICD-10-CM

## 2016-05-25 DIAGNOSIS — M79605 Pain in left leg: Secondary | ICD-10-CM | POA: Diagnosis not present

## 2016-05-25 NOTE — Patient Instructions (Signed)
Great to see you!  Try B complex vitamins 3 times daily ( morning, dinner, and before bed, with at least 30 mg of B6)  We will let you know how the labs turn out. We may need to try a period of time off of lipitor ( 2weeks off, if pain resolves, restart to see if it comes back). We can always try a different statin.

## 2016-05-25 NOTE — Progress Notes (Signed)
   HPI  Patient presents today with bilateral lower leg pain.  Patient explains he's had about 6 weeks of dull achy pain and bilateral calves and lower legs. Patient states that it's worse when lying down in his recliner or bending down to pick something up. He has improvement with hanging his legs off the side of the recliner. He has no predictable pain with walking.  He denies discoloration or cool feeling of the legs. Denies any injury.  PMH: Smoking status noted ROS: Per HPI  Objective: BP 116/78   Pulse 80   Temp (!) 96.7 F (35.9 C) (Oral)   Ht '6\' 2"'$  (1.88 m)   Wt 195 lb 9.6 oz (88.7 kg)   BMI 25.11 kg/m  Gen: NAD, alert, cooperative with exam HEENT: NCAT CV: RRR, good S1/S2, no murmur Resp: CTABL, no wheezes, non-labored Ext: No edema,2+ DP pulses, reduced hair on the toes and lower legs, no discoloration, normal warmth.  Neuro: Alert and oriented, No gross deficits   Assessment and plan:  # Bilateral leg pain Bilateral calf pain, unclear etiology. Considered PAD, however he has very good pulses and the pain is not similar to claudication. He does have improvement with hanging his legs off of his recliner which is consistent with PAD however. Patient has worsening of the pain with relaxing or bending down. Trial of B complex vitamins given his leg cramps as well. If this is not improved in 2 weeks I recommended trial off of Lipitor for 2 weeks, if complete resolution and restart to see if the symptoms return. I would then change him to pravastatin or livalo RTC on 2 months    Laroy Apple, MD [facility] 05/25/2016, 10:47 AM

## 2016-06-05 ENCOUNTER — Other Ambulatory Visit: Payer: Self-pay | Admitting: Cardiovascular Disease

## 2016-06-14 ENCOUNTER — Encounter: Payer: Self-pay | Admitting: Family Medicine

## 2016-06-14 ENCOUNTER — Ambulatory Visit (INDEPENDENT_AMBULATORY_CARE_PROVIDER_SITE_OTHER): Payer: Medicare Other | Admitting: Family Medicine

## 2016-06-14 VITALS — BP 119/78 | HR 65 | Temp 96.9°F | Ht 74.0 in | Wt 197.2 lb

## 2016-06-14 DIAGNOSIS — I2510 Atherosclerotic heart disease of native coronary artery without angina pectoris: Secondary | ICD-10-CM

## 2016-06-14 DIAGNOSIS — E785 Hyperlipidemia, unspecified: Secondary | ICD-10-CM

## 2016-06-14 DIAGNOSIS — M791 Myalgia, unspecified site: Secondary | ICD-10-CM

## 2016-06-14 DIAGNOSIS — N400 Enlarged prostate without lower urinary tract symptoms: Secondary | ICD-10-CM

## 2016-06-14 MED ORDER — PRAVASTATIN SODIUM 20 MG PO TABS: 20.0000 mg | ORAL_TABLET | Freq: Every day | ORAL | 3 refills | Status: DC

## 2016-06-14 NOTE — Patient Instructions (Signed)
Great to see you!  I have [facility], take 1 pill once daily.

## 2016-06-14 NOTE — Progress Notes (Signed)
   HPI  Patient presents today here for follow-up chronic medical conditions.  Hyperlipidemia Patient has stopped Lipitor due to myalgias, he had improvement after 3 days. He also started B complex vitamins. Like to try different statin. He was started due to elevated cholesterol and calcification seen on CT.  BPH Doing well with 0.8 mg Flomax. No nocturia on many nights. No side effects.  Myalgias Patient stop statin prior to starting B complex 59. He is still taking B complex vitamin  PMH: Smoking status noted ROS: Per HPI  Objective: BP 119/78   Pulse 65   Temp (!) 96.9 F (36.1 C) (Oral)   Ht 6\' 2"  (1.88 m)   Wt 197 lb 3.2 oz (89.4 kg)   BMI 25.32 kg/m  Gen: NAD, alert, cooperative with exam HEENT: NCAT CV: RRR, good S1/S2, no murmur Resp: CTABL, no wheezes, non-labored Ext: No edema, warm Neuro: Alert and oriented, No gross deficits  Assessment and plan:  # Hyperlipidemia Patient not tolerating Lipitor, changing to pravastatin, primary prevention Has coronary calcium on CT.  # Myalgia Patient with improvement after stopping Lipitor, he then started B complex vitamins which he has continued  # BPH Well-controlled on 0.8 mg of Flomax. Continue  Meds ordered this encounter  Medications  . pravastatin (PRAVACHOL) 20 MG tablet    Sig: Take 1 tablet (20 mg total) by mouth daily.    Dispense:  90 tablet    Refill:  [place], MD [place] 06/14/2016, 8:47 AM

## 2016-07-02 ENCOUNTER — Other Ambulatory Visit: Payer: Self-pay | Admitting: Cardiovascular Disease

## 2016-08-18 ENCOUNTER — Ambulatory Visit (INDEPENDENT_AMBULATORY_CARE_PROVIDER_SITE_OTHER): Payer: Medicare Other | Admitting: Family Medicine

## 2016-08-18 ENCOUNTER — Encounter: Payer: Self-pay | Admitting: Family Medicine

## 2016-08-18 ENCOUNTER — Other Ambulatory Visit: Payer: Self-pay | Admitting: Family Medicine

## 2016-08-18 ENCOUNTER — Ambulatory Visit (INDEPENDENT_AMBULATORY_CARE_PROVIDER_SITE_OTHER): Payer: Medicare Other

## 2016-08-18 VITALS — BP 126/82 | HR 83 | Temp 96.8°F | Ht 74.0 in | Wt 200.0 lb

## 2016-08-18 DIAGNOSIS — M25512 Pain in left shoulder: Secondary | ICD-10-CM | POA: Diagnosis not present

## 2016-08-18 DIAGNOSIS — I2510 Atherosclerotic heart disease of native coronary artery without angina pectoris: Secondary | ICD-10-CM | POA: Diagnosis not present

## 2016-08-18 DIAGNOSIS — R52 Pain, unspecified: Secondary | ICD-10-CM

## 2016-08-18 IMAGING — DX DG SHOULDER 2+V*L*
3 series · 3 of 3 positions shown · non-contrast
Comparison: None.

CLINICAL DATA: Left shoulder pain after hearing a pop

EXAM:
LEFT SHOULDER - 2+ VIEW

[shoulder ap]
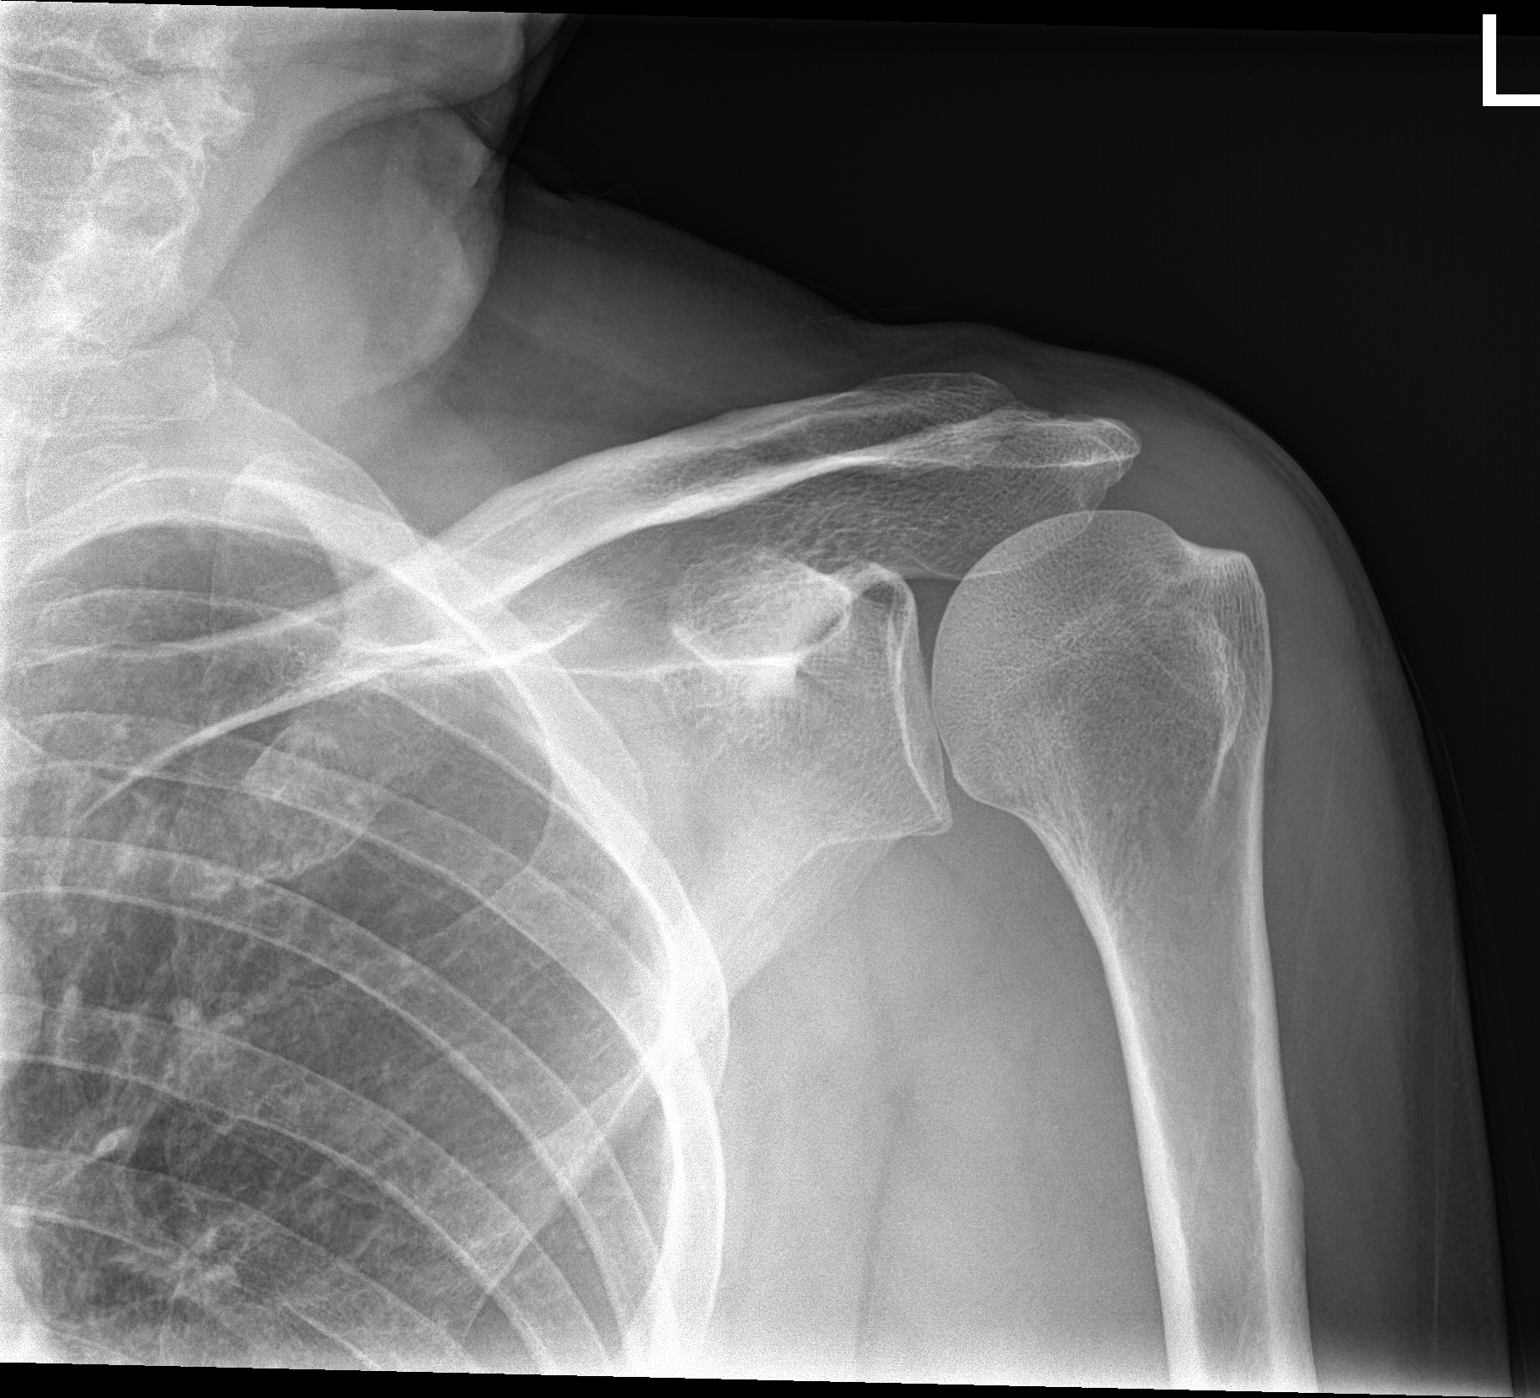

[shoulder obl]
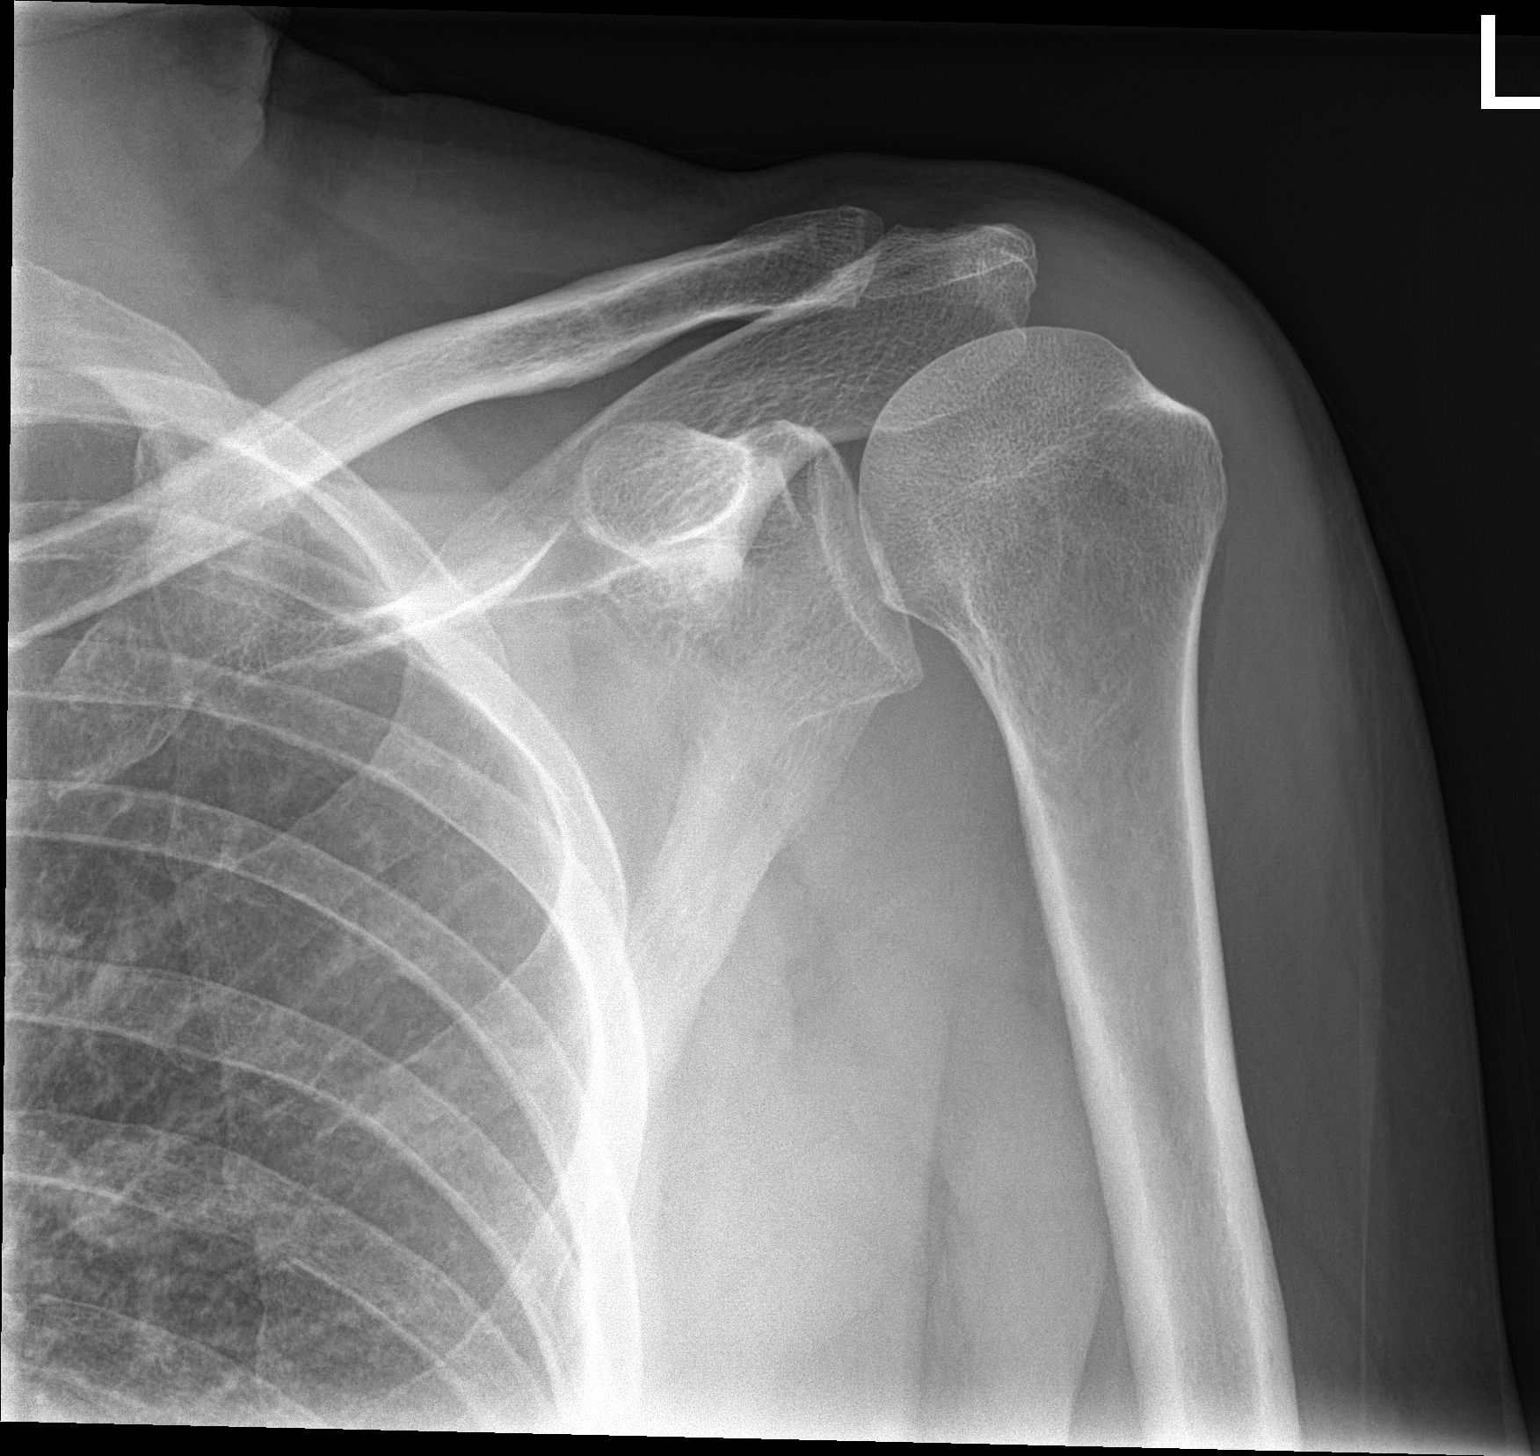

[shoulder axial]
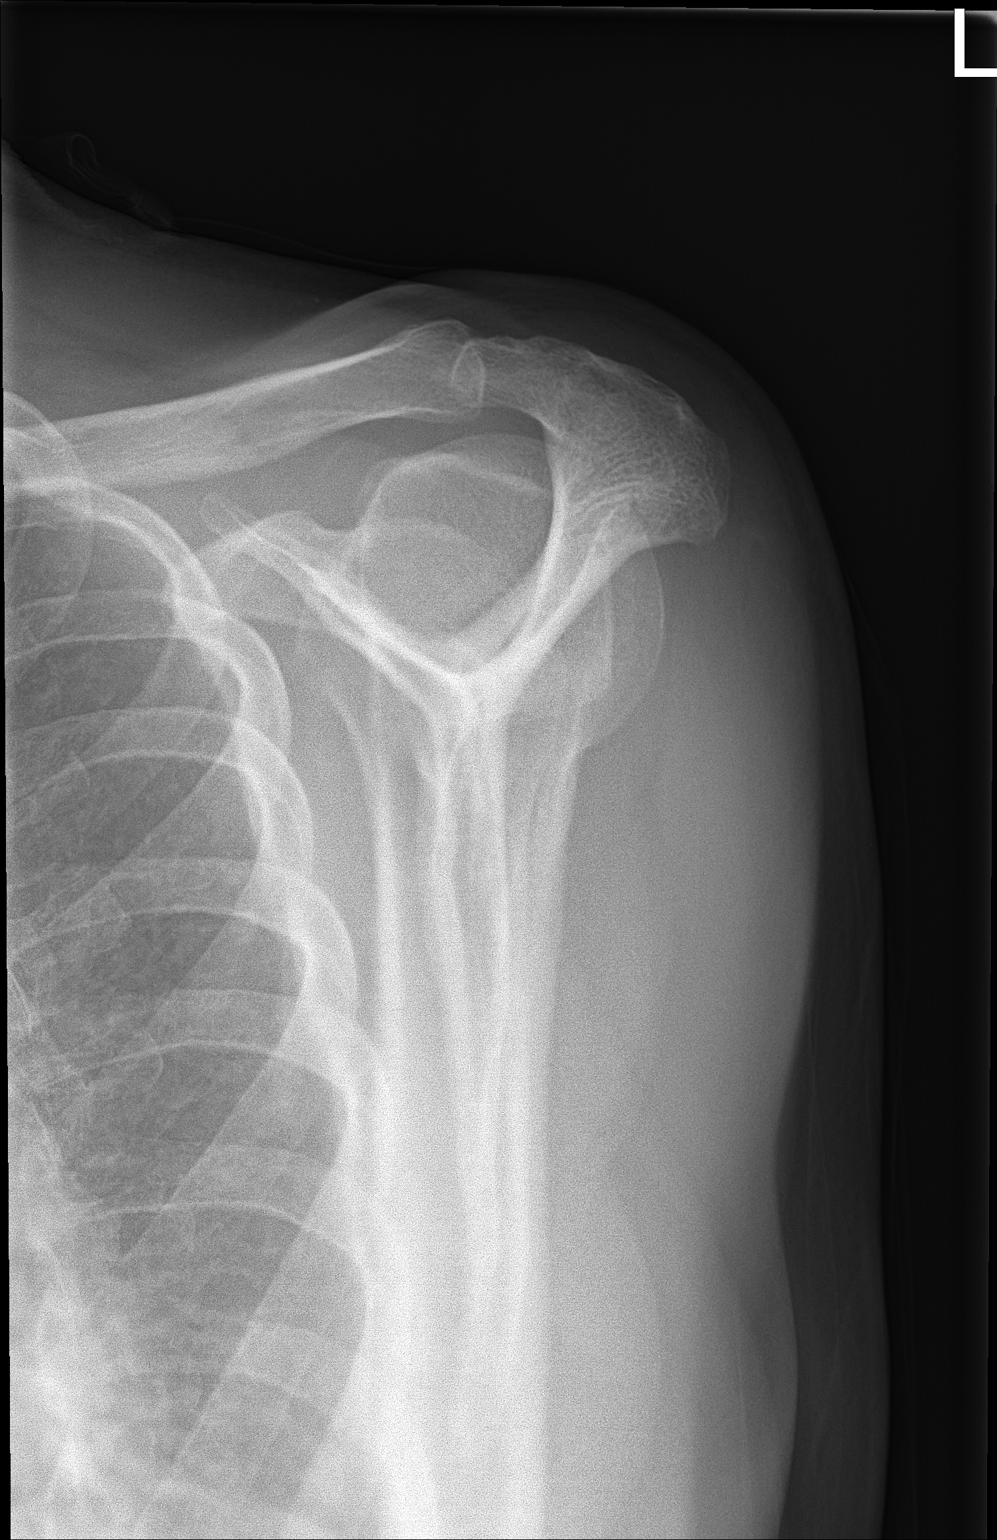

[3 of 3 positions shown; findings below may reference images not displayed]

FINDINGS: There is no evidence of fracture or dislocation. There is no
evidence of arthropathy or other focal bone abnormality. Soft
tissues are unremarkable.
IMPRESSION: Negative.

## 2016-08-18 MED ORDER — DULOXETINE HCL 60 MG PO CPEP: 60.0000 mg | ORAL_CAPSULE | Freq: Every day | ORAL | 3 refills | Status: DC

## 2016-08-18 MED ORDER — TRAMADOL HCL 50 MG PO TABS: 50.0000 mg | ORAL_TABLET | Freq: Four times a day (QID) | ORAL | 0 refills | Status: DC | PRN

## 2016-08-18 NOTE — Patient Instructions (Signed)
Great to see you!   

## 2016-08-18 NOTE — Progress Notes (Signed)
   HPI  Patient presents today with a shoulder injury.  Patient explains he is working on his [place] 2 days ago when he was torquing a ball-tipped felt a sudden pop in his left lateral posterior shoulder. He had immediate weakness and pain. He had severe pain yesterday and has had improvement in the pain today. He's been using ice. He is avoiding NSAIDs given his heart disease. Strength has improved today, pain has improved, Tried a arm sling with increased pain.   PMH: Smoking status noted ROS: Per HPI  Objective: BP 126/82   Pulse 83   Temp (!) 96.8 F (36 C) (Oral)   Ht 6\' 2"  (1.88 m)   Wt 200 lb (90.7 kg)   BMI 25.68 kg/m  Gen: NAD, alert, cooperative with exam HEENT: NCAT CV: RRR, good S1/S2, no murmur Resp: CTABL, no wheezes, non-labored Ext: No edema, warm Neuro: Alert and oriented, No gross deficits MSK Limited range of motion on the left shoulder, flex for greater than 90, preserved range of motion with abduction and rotation Tenderness to palpation over the subscapularis and deltoid Hawkins and empty can test is preserved  Assessment and plan:  # Left shoulder pain Plain film appears normal to me. His rotator cuff specific testing is preserved With good improvement recommended continue ice and gentle range of motion exercises, tramadol given Follow-up one week if symptoms are not improved for considering cortisone injection Also offered orthopedic referral if symptoms worsen or do not improve as expected- Sees [place] but would need referral    Meds ordered this encounter  Medications  . traMADol (ULTRAM) 50 MG tablet    Sig: Take 1 tablet (50 mg total) by mouth every 6 (six) hours as needed.    Dispense:  20 tablet    Refill:  0  . DISCONTD: DULoxetine (CYMBALTA) 60 MG capsule    Sig: Take 1 capsule (60 mg total) by mouth daily.    Dispense:  90 capsule    Refill:  3  . DULoxetine (CYMBALTA) 60 MG capsule    Sig: Take 1 capsule (60 mg total) by mouth  daily.    Dispense:  90 capsule    Refill:  [place], MD [facility] 08/18/2016, 9:59 AM

## 2016-08-21 ENCOUNTER — Other Ambulatory Visit: Payer: Self-pay | Admitting: Family Medicine

## 2016-08-21 ENCOUNTER — Other Ambulatory Visit: Payer: Self-pay | Admitting: Hematology and Oncology

## 2016-09-03 ENCOUNTER — Encounter: Payer: Self-pay | Admitting: Physician Assistant

## 2016-09-03 ENCOUNTER — Ambulatory Visit (INDEPENDENT_AMBULATORY_CARE_PROVIDER_SITE_OTHER): Payer: Medicare Other | Admitting: Physician Assistant

## 2016-09-03 VITALS — BP 114/78 | HR 83 | Temp 97.2°F | Ht 74.0 in | Wt 196.0 lb

## 2016-09-03 DIAGNOSIS — J40 Bronchitis, not specified as acute or chronic: Secondary | ICD-10-CM

## 2016-09-03 DIAGNOSIS — J9801 Acute bronchospasm: Secondary | ICD-10-CM

## 2016-09-03 DIAGNOSIS — R059 Cough, unspecified: Secondary | ICD-10-CM

## 2016-09-03 DIAGNOSIS — R05 Cough: Secondary | ICD-10-CM | POA: Diagnosis not present

## 2016-09-03 MED ORDER — AZITHROMYCIN 250 MG PO TABS: ORAL_TABLET | ORAL | 0 refills | Status: DC

## 2016-09-03 MED ORDER — PREDNISONE 10 MG (21) PO TBPK: ORAL_TABLET | ORAL | 0 refills | Status: DC

## 2016-09-03 NOTE — Patient Instructions (Signed)
In a few days you may receive a survey in the mail or online from [employer] regarding your visit with us today. Please take a moment to fill this out. Your feedback is very important to our whole office. It can help us better understand your needs as well as improve your experience and satisfaction. Thank you for taking your time to complete it. We care about you.  Kelsen Celona, PA-C  

## 2016-09-03 NOTE — Progress Notes (Signed)
BP 114/78   Pulse 83   Temp (!) 97.2 F (36.2 C) (Oral)   Ht 6\' 2"  (1.88 m)   Wt 196 lb (88.9 kg)   BMI 25.16 kg/m    Subjective:    Patient ID: Anthony Kelly, male    DOB: July 13, 1945, 71 y.o.   MRN: [id number]  HPI: Anthony Kelly is a 71 y.o. male presenting on 09/03/2016 for Cough and chest congestion  Patient with several days of progressing upper respiratory and bronchial symptoms. Initially there was more upper respiratory congestion. This progressed to having significant cough that is productive throughout the day and severe at night. There is significant wheezing and coughing. Sometimes there is slight dyspnea on exertion. It is productive mucus that is gray in color. Denies any blood.   Relevant past medical, surgical, family and social history reviewed and updated as indicated. Allergies and medications reviewed and updated.  Past Medical History:  Diagnosis Date  . Acute pericarditis    a. 04/2013 -adm with CP, elevated CRP. H/o coronary artery calcification on prior CT but nuc was negative, EF 71%.  . Arthritis   . Atrial fibrillation ([place])    a. Isolated episode in the setting of acute pericarditis 04/2013. Was not placed on anticoag.  [place] BPH (benign prostatic hyperplasia)   . Cataract   . Diverticulitis 08/16/2013  . Hyperlipidemia    elevated triglycerides  . 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 Pain in joint, pelvic region and thigh 08/01/2013  . PSVT (paroxysmal supraventricular tachycardia) ([place])     Past Surgical History:  Procedure Laterality Date  . CHOLECYSTECTOMY    .  EXPLORATORY LAPAROTOMY    . EYE SURGERY  2016   cataract  . LUNG LOBECTOMY     right side  . LYMPH NODE BIOPSY     in groin with removal  . MENISCUS REPAIR     right knee  . PROSTATE SURGERY    . REFRACTIVE SURGERY Right    piece of metal removed  . SUBMANDIBULAR GLAND EXCISION  04/2011  . SUBMANDIBULAR GLAND EXCISION  04/30/2011   Procedure: EXCISION SUBMANDIBULAR GLAND;  Surgeon: Jerrell Belfast, MD;  Location: [facility];  Service: ENT;  Laterality: Left;  WITH DIAGNOSTIC BIOPSY  . TONSILLECTOMY     as a child  . VEIN LIGATION AND STRIPPING     right leg    Review of Systems  Constitutional: Positive for fatigue. Negative for appetite change and fever.  HENT: Positive for congestion and sore throat. Negative for sinus pressure.   Eyes: Negative.  Negative for pain and visual disturbance.  Respiratory: Positive for cough, shortness of breath and wheezing. Negative for chest tightness.   Cardiovascular: Negative.  Negative for chest pain, palpitations and leg swelling.  Gastrointestinal: Negative.  Negative for abdominal pain, diarrhea, nausea and vomiting.  Endocrine: Negative.   Genitourinary: Negative.   Musculoskeletal: Positive for back pain and myalgias.  Skin: Negative.  Negative for color change and rash.  Neurological: Positive for headaches. Negative for weakness and numbness.  Psychiatric/Behavioral: Negative.     Allergies as  of 09/03/2016   No Known Allergies     Medication List       Accurate as of 09/03/16  1:54 PM. Always use your most recent med list.          aspirin EC 81 MG tablet Take 81 mg by mouth every morning.   azithromycin 250 MG tablet Commonly known as:  ZITHROMAX Z-PAK Take as directed   diltiazem 120 MG 24 hr capsule Commonly known as:  CARDIZEM CD TAKE 1 CAPSULE EVERY MORNING   DULoxetine 60 MG capsule Commonly known as:  CYMBALTA Take 1 capsule (60 mg total) by mouth daily.   multivitamin with minerals Tabs tablet Take 1 tablet  by mouth every morning.   omeprazole 20 MG capsule Commonly known as:  PRILOSEC TAKE 1 CAPSULE DAILY   pravastatin 20 MG tablet Commonly known as:  PRAVACHOL Take 1 tablet (20 mg total) by mouth daily.   predniSONE 10 MG (21) Tbpk tablet Commonly known as:  STERAPRED UNI-PAK 21 TAB As directed x 6 days   PROBIOTIC DAILY PO Take 1 capsule by mouth 3 (three) times daily.   tamsulosin 0.4 MG Caps capsule Commonly known as:  FLOMAX TAKE 2 CAPSULES DAILY AFTER SUPPER   TOPROL XL 25 MG 24 hr tablet Generic drug:  metoprolol succinate TAKE 1 TABLET DAILY   traMADol 50 MG tablet Commonly known as:  ULTRAM Take 1 tablet (50 mg total) by mouth every 6 (six) hours as needed.            Discharge Care Instructions        Start     Ordered   09/03/16 0000  azithromycin (ZITHROMAX Z-PAK) 250 MG tablet    Question:  Supervising Provider  Answer:  [name]   09/03/16 1210   09/03/16 0000  predniSONE (STERAPRED UNI-PAK 21 TAB) 10 MG (21) TBPK tablet    Question:  Supervising Provider  Answer:  [name]   09/03/16 1210         Objective:    BP 114/78   Pulse 83   Temp (!) 97.2 F (36.2 C) (Oral)   Ht 6\' 2"  (1.88 m)   Wt 196 lb (88.9 kg)   BMI 25.16 kg/m   No Known Allergies  Physical Exam  Constitutional: He appears well-developed and well-nourished.  HENT:  Head: Normocephalic and atraumatic.  Right Ear: Hearing and tympanic membrane normal.  Left Ear: Hearing and tympanic membrane normal.  Nose: Mucosal edema and sinus tenderness present. No nasal deformity. Right sinus exhibits frontal sinus tenderness. Left sinus exhibits frontal sinus tenderness.  Mouth/Throat: Posterior oropharyngeal erythema present.  Eyes: Pupils are equal, round, and reactive to light. Conjunctivae and EOM are normal. Right eye exhibits no discharge. Left eye exhibits no discharge.  Neck: Normal range of motion. Neck supple.  Cardiovascular: Normal rate, regular rhythm  and normal heart sounds.   Pulmonary/Chest: Effort normal. No respiratory distress. He has no decreased breath sounds. He has wheezes. He has no rhonchi. He has no rales.  Abdominal: Soft. Bowel sounds are normal.  Musculoskeletal: Normal range of motion.  Skin: Skin is warm and dry.  Nursing note and vitals reviewed.       Assessment & Plan:   1. Bronchitis - azithromycin (ZITHROMAX Z-PAK) 250 MG tablet; Take as directed  Dispense: 6 each; Refill: 0 - predniSONE (STERAPRED UNI-PAK 21 TAB) 10 MG (21) TBPK tablet; As directed x 6 days  Dispense: 21 tablet; Refill: 0  2. Cough - predniSONE (STERAPRED UNI-PAK 21 TAB) 10 MG (21) TBPK tablet; As directed x 6 days  Dispense: 21 tablet; Refill: 0  3. Bronchospasm - predniSONE (STERAPRED UNI-PAK 21 TAB) 10 MG (21) TBPK tablet; As directed x 6 days  Dispense: 21 tablet; Refill: 0    Current Outpatient Prescriptions:  .  aspirin EC 81 MG tablet, Take 81 mg by mouth every morning., Disp: , Rfl:  .  diltiazem (CARDIZEM CD) 120 MG 24 hr capsule, TAKE 1 CAPSULE EVERY MORNING, Disp: 90 capsule, Rfl: 2 .  DULoxetine (CYMBALTA) 60 MG capsule, Take 1 capsule (60 mg total) by mouth daily., Disp: 90 capsule, Rfl: 3 .  Multiple Vitamin (MULTIVITAMIN WITH MINERALS) TABS tablet, Take 1 tablet by mouth every morning. , Disp: , Rfl:  .  omeprazole (PRILOSEC) 20 MG capsule, TAKE 1 CAPSULE DAILY, Disp: 90 capsule, Rfl: 2 .  pravastatin (PRAVACHOL) 20 MG tablet, Take 1 tablet (20 mg total) by mouth daily., Disp: 90 tablet, Rfl: 3 .  Probiotic Product (PROBIOTIC DAILY PO), Take 1 capsule by mouth 3 (three) times daily. , Disp: , Rfl:  .  tamsulosin (FLOMAX) 0.4 MG CAPS capsule, TAKE 2 CAPSULES DAILY AFTER SUPPER, Disp: 180 capsule, Rfl: 0 .  TOPROL XL 25 MG 24 hr tablet, TAKE 1 TABLET DAILY, Disp: 90 tablet, Rfl: 3 .  traMADol (ULTRAM) 50 MG tablet, Take 1 tablet (50 mg total) by mouth every 6 (six) hours as needed., Disp: 20 tablet, Rfl: 0 .  azithromycin  (ZITHROMAX Z-PAK) 250 MG tablet, Take as directed, Disp: 6 each, Rfl: 0 .  predniSONE (STERAPRED UNI-PAK 21 TAB) 10 MG (21) TBPK tablet, As directed x 6 days, Disp: 21 tablet, Rfl: 0 Continue all other maintenance medications as listed above.  Follow up plan: Return if symptoms worsen or fail to improve.  Educational handout given for [place] PA-C [place] [address]  [place], [place] [address] [phone]   09/03/2016, 1:54 PM

## 2016-09-28 ENCOUNTER — Encounter: Payer: Self-pay | Admitting: Family Medicine

## 2016-09-28 DIAGNOSIS — M5136 Other intervertebral disc degeneration, lumbar region: Secondary | ICD-10-CM

## 2016-10-12 DIAGNOSIS — Z23 Encounter for immunization: Secondary | ICD-10-CM | POA: Diagnosis not present

## 2016-10-14 ENCOUNTER — Ambulatory Visit (INDEPENDENT_AMBULATORY_CARE_PROVIDER_SITE_OTHER): Payer: Medicare Other | Admitting: Family Medicine

## 2016-10-14 ENCOUNTER — Encounter: Payer: Self-pay | Admitting: Family Medicine

## 2016-10-14 VITALS — BP 113/79 | HR 84 | Temp 97.0°F | Ht 74.0 in | Wt 193.0 lb

## 2016-10-14 DIAGNOSIS — M545 Low back pain, unspecified: Secondary | ICD-10-CM

## 2016-10-14 DIAGNOSIS — R7309 Other abnormal glucose: Secondary | ICD-10-CM | POA: Diagnosis not present

## 2016-10-14 DIAGNOSIS — E785 Hyperlipidemia, unspecified: Secondary | ICD-10-CM

## 2016-10-14 DIAGNOSIS — I2510 Atherosclerotic heart disease of native coronary artery without angina pectoris: Secondary | ICD-10-CM

## 2016-10-14 DIAGNOSIS — L570 Actinic keratosis: Secondary | ICD-10-CM | POA: Diagnosis not present

## 2016-10-14 LAB — CMP14+EGFR
ALK PHOS: 70 IU/L (ref 39–117)
ALT: 17 IU/L (ref 0–44)
AST: 20 IU/L (ref 0–40)
Albumin/Globulin Ratio: 2 (ref 1.2–2.2)
Albumin: 4.6 g/dL (ref 3.5–4.8)
BUN / CREAT RATIO: 11 (ref 10–24)
BUN: 12 mg/dL (ref 8–27)
Bilirubin Total: 0.6 mg/dL (ref 0.0–1.2)
CO2: 25 mmol/L (ref 20–29)
CREATININE: 1.14 mg/dL (ref 0.76–1.27)
Calcium: 9.5 mg/dL (ref 8.6–10.2)
Chloride: 102 mmol/L (ref 96–106)
GFR calc Af Amer: 74 mL/min/{1.73_m2} (ref >59)
GFR calc non Af Amer: 64 mL/min/{1.73_m2} (ref >59)
GLOBULIN, TOTAL: 2.3 g/dL (ref 1.5–4.5)
Glucose: 135 mg/dL — ABNORMAL HIGH (ref 65–99)
Potassium: 5 mmol/L (ref 3.5–5.2)
SODIUM: 142 mmol/L (ref 134–144)
Total Protein: 6.9 g/dL (ref 6.0–8.5)

## 2016-10-14 LAB — CBC WITH DIFFERENTIAL/PLATELET
BASOS: 1 %
Basophils Absolute: 0 10*3/uL (ref 0.0–0.2)
EOS (ABSOLUTE): 0.2 10*3/uL (ref 0.0–0.4)
EOS: 3 %
HEMATOCRIT: 49 % (ref 37.5–51.0)
Hemoglobin: 16.4 g/dL (ref 13.0–17.7)
Immature Grans (Abs): 0 10*3/uL (ref 0.0–0.1)
Immature Granulocytes: 0 %
LYMPHS ABS: 1.2 10*3/uL (ref 0.7–3.1)
Lymphs: 16 %
MCH: 30.5 pg (ref 26.6–33.0)
MCHC: 33.5 g/dL (ref 31.5–35.7)
MCV: 91 fL (ref 79–97)
MONOS ABS: 0.9 10*3/uL (ref 0.1–0.9)
Monocytes: 12 %
Neutrophils Absolute: 5.4 10*3/uL (ref 1.4–7.0)
Neutrophils: 68 %
Platelets: 188 10*3/uL (ref 150–379)
RBC: 5.38 x10E6/uL (ref 4.14–5.80)
RDW: 13.8 % (ref 12.3–15.4)
WBC: 7.8 10*3/uL (ref 3.4–10.8)

## 2016-10-14 LAB — LIPID PANEL
CHOL/HDL RATIO: 2.8 ratio (ref 0.0–5.0)
Cholesterol, Total: 115 mg/dL (ref 100–199)
HDL: 41 mg/dL (ref >39)
LDL Calculated: 37 mg/dL (ref 0–99)
Triglycerides: 186 mg/dL — ABNORMAL HIGH (ref 0–149)
VLDL Cholesterol Cal: 37 mg/dL (ref 5–40)

## 2016-10-14 MED ORDER — METHYLPREDNISOLONE ACETATE 80 MG/ML IJ SUSP
80.0000 mg | Freq: Once | INTRAMUSCULAR | Status: AC
  Administered 2016-10-14: 80 mg via INTRAMUSCULAR

## 2016-10-14 NOTE — Addendum Note (Signed)
Addended by: Nigel Berthold C on: 10/14/2016 09:03 AM   Modules accepted: Orders

## 2016-10-14 NOTE — Progress Notes (Signed)
   HPI  Patient presents today here for follow-up chronic medical conditions as well as back pain and ear pain.  Patient has a painful precancer in the left ear, this was frozen about one year ago by his dermatologist, a recently resolved so he canceled his appointment, however now painful again.  Hyperlipidemia Lipitor causing myalgias, tolerating pravastatin well. Fasting today.  Low back pain Patient has long history of degenerative disc disease, he does have a history of B-cell lymphoma, MRI earlier this year with no concerns for recurrent malignancy. Patient describes 3 week flare of upper lumbar pain with bilateral shooting pains laterally towards his PSIS.  PMH: Smoking status noted ROS: Per HPI  Objective: BP 113/79   Pulse 84   Temp (!) 97 F (36.1 C) (Oral)   Ht '6\' 2"'$  (1.88 m)   Wt 193 lb (87.5 kg)   BMI 24.78 kg/m  Gen: NAD, alert, cooperative with exam HEENT: NCAT CV: RRR, good S1/S2, no murmur Resp: CTABL, no wheezes, non-labored Ext: No edema, warm Neuro: Alert and oriented, No gross deficits Skin:  Keratotic slightly erythematous lesion on the left ear on the tragus Msk:  Tenderness to palpation bilateral paraspinal muscles of the lumbar area, mild tenderness midline spine   Assessment and plan:  # Hyperlipidemia Tolerating pravastatin well Fasting labs today  # Actinic keratosis Discussed with patient, recommended following up with his dermatologist Recurrent  # Low back pain, DDD Patient with current flare of chronic low back pain, he's had good improvement of baseline pain with Cymbalta. Given IM Depo-Medrol today Consider adding Gabapentin if becomes more common     Orders Placed This Encounter  Procedures  . CMP14+EGFR  . CBC with Differential/Platelet  . Lipid panel     Laroy Apple, MD [facility] 10/14/2016, 8:56 AM

## 2016-10-14 NOTE — Patient Instructions (Signed)
Great to see you!  If your pain is more recurrent we could consider gabapentin to help it.   Come back in 6 months unless you need [place] sooner.

## 2016-10-21 LAB — SPECIMEN STATUS REPORT

## 2016-10-21 LAB — HGB A1C W/O EAG: HEMOGLOBIN A1C: 6.2 % — AB (ref 4.8–5.6)

## 2016-10-22 ENCOUNTER — Telehealth: Payer: Self-pay | Admitting: Family Medicine

## 2016-10-22 NOTE — Telephone Encounter (Signed)
Called and discussed new DM2 Diagnosis. He will go to a diabetes calss at [facility].   We discussed 3 month follow up andno need for meds at this time.   Laroy Apple, MD [facility] 10/22/2016, 5:30 PM

## 2016-11-02 DIAGNOSIS — M545 Low back pain: Secondary | ICD-10-CM | POA: Diagnosis not present

## 2016-11-15 DIAGNOSIS — L578 Other skin changes due to chronic exposure to nonionizing radiation: Secondary | ICD-10-CM | POA: Diagnosis not present

## 2016-11-20 ENCOUNTER — Other Ambulatory Visit: Payer: Self-pay | Admitting: Family Medicine

## 2016-12-07 DIAGNOSIS — H40013 Open angle with borderline findings, low risk, bilateral: Secondary | ICD-10-CM | POA: Diagnosis not present

## 2016-12-07 DIAGNOSIS — H2512 Age-related nuclear cataract, left eye: Secondary | ICD-10-CM | POA: Diagnosis not present

## 2016-12-07 DIAGNOSIS — H5210 Myopia, unspecified eye: Secondary | ICD-10-CM | POA: Diagnosis not present

## 2016-12-07 DIAGNOSIS — H353131 Nonexudative age-related macular degeneration, bilateral, early dry stage: Secondary | ICD-10-CM | POA: Diagnosis not present

## 2016-12-07 DIAGNOSIS — H1789 Other corneal scars and opacities: Secondary | ICD-10-CM | POA: Diagnosis not present

## 2016-12-08 DIAGNOSIS — H40013 Open angle with borderline findings, low risk, bilateral: Secondary | ICD-10-CM | POA: Diagnosis not present

## 2016-12-08 DIAGNOSIS — Z961 Presence of intraocular lens: Secondary | ICD-10-CM | POA: Diagnosis not present

## 2016-12-08 DIAGNOSIS — H2512 Age-related nuclear cataract, left eye: Secondary | ICD-10-CM | POA: Diagnosis not present

## 2016-12-13 DIAGNOSIS — Z0000 Encounter for general adult medical examination without abnormal findings: Secondary | ICD-10-CM | POA: Diagnosis not present

## 2016-12-30 DIAGNOSIS — L578 Other skin changes due to chronic exposure to nonionizing radiation: Secondary | ICD-10-CM | POA: Diagnosis not present

## 2017-01-06 ENCOUNTER — Telehealth: Payer: Self-pay | Admitting: Cardiovascular Disease

## 2017-01-14 ENCOUNTER — Encounter: Payer: Self-pay | Admitting: Family Medicine

## 2017-01-14 ENCOUNTER — Ambulatory Visit (INDEPENDENT_AMBULATORY_CARE_PROVIDER_SITE_OTHER): Payer: Medicare Other | Admitting: Family Medicine

## 2017-01-14 VITALS — BP 121/79 | HR 67 | Temp 96.8°F | Ht 74.0 in | Wt 192.0 lb

## 2017-01-14 DIAGNOSIS — M25512 Pain in left shoulder: Secondary | ICD-10-CM

## 2017-01-14 DIAGNOSIS — E119 Type 2 diabetes mellitus without complications: Secondary | ICD-10-CM | POA: Diagnosis not present

## 2017-01-14 DIAGNOSIS — E785 Hyperlipidemia, unspecified: Secondary | ICD-10-CM

## 2017-01-14 LAB — BAYER DCA HB A1C WAIVED: HB A1C (BAYER DCA - WAIVED): 5.8 % (ref <7.0)

## 2017-01-14 MED ORDER — METOPROLOL SUCCINATE ER 25 MG PO TB24: 25.0000 mg | ORAL_TABLET | Freq: Every day | ORAL | 3 refills | Status: DC

## 2017-01-14 MED ORDER — OMEPRAZOLE 20 MG PO CPDR: 20.0000 mg | DELAYED_RELEASE_CAPSULE | Freq: Every day | ORAL | 3 refills | Status: DC

## 2017-01-14 MED ORDER — DILTIAZEM HCL ER COATED BEADS 120 MG PO CP24: 120.0000 mg | ORAL_CAPSULE | Freq: Every morning | ORAL | 3 refills | Status: DC

## 2017-01-14 NOTE — Progress Notes (Signed)
   HPI  Patient presents today for follow-up chronic medical conditions as well as left shoulder pain.  Patient has had left shoulder pain for about 1 month.  Started after a tractor accident where he felt a pop and had understandable pain for a few days, however he continues to have pain almost 4 weeks later. He describes posterior shoulder pain that radiates down to his left elbow. No weakness, no change in range of motion. He has pain with driving. He tries muscle rub and heat with no improvement.  Prediabetes Watching diet moderately.  PMH: Smoking status noted ROS: Per HPI  Objective: BP 121/79   Pulse 67   Temp (!) 96.8 F (36 C) (Oral)   Ht _0  (1.88 m)   Wt 192 lb (87.1 kg)   BMI 24.65 kg/m  Gen: NAD, alert, cooperative with exam HEENT: NCAT CV: RRR, good S1/S2, no murmur Resp: CTABL, no wheezes, non-labored Ext: No edema, warm Neuro: Alert and oriented, No gross deficits MSK:  Left shoulder with slightly positive empty can test, negative Hawkins and Neer sign, positive tenderness over the long head of the biceps, no other tenderness in the bony or muscular landmarks.   L shoulder injection Informed consent obtained and placed in chart.   Area cleaned with iodine x 2 and wiped clear with alcohol swab.  Using 21 1/2 gauge needle 1 cc Kenalog and 3 cc's 1% Lidocaine were injected in subacromial space via posterior approach.  Sterile bandage placed.  Patient tolerated procedure well.  No complications.     Assessment and plan:  #Left shoulder pain Multifactorial, likely rotator cuff tendinitis plus biceps tendinitis Treated with subacromial bursal injection of Kenalog today Discussed supportive care as well  #Prediabetes A1c improved to 5.8 from 6.2. Congratulated. Continue therapeutic lifestyle changes  #Hyperlipidemia Well-controlled, labs repeated today Continue statin     Orders Placed This Encounter  Procedures  . Bayer DCA Hb A1c Waived  .  CMP14+EGFR  . CBC with Differential/Platelet  . Lipid panel  . TSH    Meds ordered this encounter  Medications  . diltiazem (CARDIZEM CD) 120 MG 24 hr capsule    Sig: Take 1 capsule (120 mg total) by mouth every morning.    Dispense:  90 capsule    Refill:  3  . omeprazole (PRILOSEC) 20 MG capsule    Sig: Take 1 capsule (20 mg total) by mouth daily.    Dispense:  90 capsule    Refill:  3  . metoprolol succinate (TOPROL XL) 25 MG 24 hr tablet    Sig: Take 1 tablet (25 mg total) by mouth daily.    Dispense:  90 tablet    Refill:  [place], MD [facility] 01/14/2017, 10:43 AM

## 2017-01-15 LAB — CBC WITH DIFFERENTIAL/PLATELET
Basophils Absolute: 0 10*3/uL (ref 0.0–0.2)
Basos: 1 %
EOS (ABSOLUTE): 0.2 10*3/uL (ref 0.0–0.4)
EOS: 4 %
Hematocrit: 49.4 % (ref 37.5–51.0)
Hemoglobin: 16.7 g/dL (ref 13.0–17.7)
IMMATURE GRANULOCYTES: 0 %
Immature Grans (Abs): 0 10*3/uL (ref 0.0–0.1)
Lymphocytes Absolute: 1.2 10*3/uL (ref 0.7–3.1)
Lymphs: 21 %
MCH: 31 pg (ref 26.6–33.0)
MCHC: 33.8 g/dL (ref 31.5–35.7)
MCV: 92 fL (ref 79–97)
MONOS ABS: 0.7 10*3/uL (ref 0.1–0.9)
Monocytes: 12 %
NEUTROS PCT: 62 %
Neutrophils Absolute: 3.6 10*3/uL (ref 1.4–7.0)
PLATELETS: 190 10*3/uL (ref 150–379)
RBC: 5.39 x10E6/uL (ref 4.14–5.80)
RDW: 13.5 % (ref 12.3–15.4)
WBC: 5.8 10*3/uL (ref 3.4–10.8)

## 2017-01-15 LAB — CMP14+EGFR
ALK PHOS: 60 IU/L (ref 39–117)
ALT: 18 IU/L (ref 0–44)
AST: 27 IU/L (ref 0–40)
Albumin/Globulin Ratio: 2 (ref 1.2–2.2)
Albumin: 4.7 g/dL (ref 3.5–4.8)
BUN/Creatinine Ratio: 12 (ref 10–24)
BUN: 13 mg/dL (ref 8–27)
Bilirubin Total: 1 mg/dL (ref 0.0–1.2)
CO2: 21 mmol/L (ref 20–29)
CREATININE: 1.07 mg/dL (ref 0.76–1.27)
Calcium: 9.6 mg/dL (ref 8.6–10.2)
Chloride: 101 mmol/L (ref 96–106)
GFR calc Af Amer: 80 mL/min/{1.73_m2} (ref >59)
GFR calc non Af Amer: 69 mL/min/{1.73_m2} (ref >59)
GLOBULIN, TOTAL: 2.3 g/dL (ref 1.5–4.5)
GLUCOSE: 93 mg/dL (ref 65–99)
POTASSIUM: 4.4 mmol/L (ref 3.5–5.2)
SODIUM: 141 mmol/L (ref 134–144)
Total Protein: 7 g/dL (ref 6.0–8.5)

## 2017-01-15 LAB — LIPID PANEL
Chol/HDL Ratio: 2.7 ratio (ref 0.0–5.0)
Cholesterol, Total: 126 mg/dL (ref 100–199)
HDL: 46 mg/dL (ref >39)
LDL Calculated: 49 mg/dL (ref 0–99)
TRIGLYCERIDES: 153 mg/dL — AB (ref 0–149)
VLDL Cholesterol Cal: 31 mg/dL (ref 5–40)

## 2017-01-15 LAB — TSH: TSH: 1.69 u[IU]/mL (ref 0.450–4.500)

## 2017-01-24 ENCOUNTER — Encounter: Payer: Self-pay | Admitting: Family Medicine

## 2017-02-01 DIAGNOSIS — Z961 Presence of intraocular lens: Secondary | ICD-10-CM | POA: Diagnosis not present

## 2017-02-01 DIAGNOSIS — H2512 Age-related nuclear cataract, left eye: Secondary | ICD-10-CM | POA: Diagnosis not present

## 2017-02-01 DIAGNOSIS — H401131 Primary open-angle glaucoma, bilateral, mild stage: Secondary | ICD-10-CM | POA: Diagnosis not present

## 2017-02-03 ENCOUNTER — Telehealth: Payer: Self-pay | Admitting: *Deleted

## 2017-02-03 ENCOUNTER — Other Ambulatory Visit: Payer: Self-pay | Admitting: Hematology and Oncology

## 2017-02-03 DIAGNOSIS — R5383 Other fatigue: Secondary | ICD-10-CM | POA: Insufficient documentation

## 2017-02-03 DIAGNOSIS — Z8572 Personal history of non-Hodgkin lymphomas: Secondary | ICD-10-CM

## 2017-02-03 NOTE — Telephone Encounter (Signed)
Pt will come on Tuesday for labs and see Dr Alvy Bimler.  Msg to scheduler

## 2017-02-03 NOTE — Telephone Encounter (Signed)
Wife left a message stating Anthony Kelly has lost 8 lbs in 3 weeks. States appetite is "great". Very concerned about weight loss. Next scan/follow up is due in April.

## 2017-02-03 NOTE — Telephone Encounter (Signed)
I have appt available Tuesday morning at 830 am If he can make it, please place order for labs and see me, 30 mins

## 2017-02-04 ENCOUNTER — Telehealth: Payer: Self-pay | Admitting: Hematology and Oncology

## 2017-02-04 NOTE — Telephone Encounter (Signed)
Patient is scheduled per 1/24 sch message. Patient is aware of date and time.

## 2017-02-08 ENCOUNTER — Telehealth: Payer: Self-pay | Admitting: Hematology and Oncology

## 2017-02-08 ENCOUNTER — Inpatient Hospital Stay: Payer: Medicare Other | Attending: Hematology and Oncology

## 2017-02-08 ENCOUNTER — Inpatient Hospital Stay (HOSPITAL_BASED_OUTPATIENT_CLINIC_OR_DEPARTMENT_OTHER): Payer: Medicare Other | Admitting: Hematology and Oncology

## 2017-02-08 ENCOUNTER — Encounter: Payer: Self-pay | Admitting: Hematology and Oncology

## 2017-02-08 VITALS — BP 103/68 | HR 81 | Temp 97.7°F | Resp 18 | Ht 74.0 in | Wt 190.9 lb

## 2017-02-08 DIAGNOSIS — Z7982 Long term (current) use of aspirin: Secondary | ICD-10-CM | POA: Insufficient documentation

## 2017-02-08 DIAGNOSIS — Z9221 Personal history of antineoplastic chemotherapy: Secondary | ICD-10-CM | POA: Insufficient documentation

## 2017-02-08 DIAGNOSIS — G8929 Other chronic pain: Secondary | ICD-10-CM | POA: Diagnosis not present

## 2017-02-08 DIAGNOSIS — N400 Enlarged prostate without lower urinary tract symptoms: Secondary | ICD-10-CM | POA: Insufficient documentation

## 2017-02-08 DIAGNOSIS — K5730 Diverticulosis of large intestine without perforation or abscess without bleeding: Secondary | ICD-10-CM | POA: Insufficient documentation

## 2017-02-08 DIAGNOSIS — M549 Dorsalgia, unspecified: Secondary | ICD-10-CM | POA: Insufficient documentation

## 2017-02-08 DIAGNOSIS — Z79899 Other long term (current) drug therapy: Secondary | ICD-10-CM | POA: Diagnosis not present

## 2017-02-08 DIAGNOSIS — R5383 Other fatigue: Secondary | ICD-10-CM

## 2017-02-08 DIAGNOSIS — Z8572 Personal history of non-Hodgkin lymphomas: Secondary | ICD-10-CM

## 2017-02-08 DIAGNOSIS — R634 Abnormal weight loss: Secondary | ICD-10-CM | POA: Insufficient documentation

## 2017-02-08 DIAGNOSIS — Z9225 Personal history of immunosupression therapy: Secondary | ICD-10-CM | POA: Diagnosis not present

## 2017-02-08 LAB — COMPREHENSIVE METABOLIC PANEL
ALBUMIN: 3.9 g/dL (ref 3.5–5.0)
ALK PHOS: 62 U/L (ref 40–150)
ALT: 28 U/L (ref 0–55)
AST: 19 U/L (ref 5–34)
Anion gap: 10 (ref 3–11)
BILIRUBIN TOTAL: 0.5 mg/dL (ref 0.2–1.2)
BUN: 13 mg/dL (ref 7–26)
CALCIUM: 9.6 mg/dL (ref 8.4–10.4)
CO2: 25 mmol/L (ref 22–29)
CREATININE: 1.13 mg/dL (ref 0.70–1.30)
Chloride: 105 mmol/L (ref 98–109)
GFR calc Af Amer: >60 mL/min (ref >60)
GFR calc non Af Amer: >60 mL/min (ref >60)
GLUCOSE: 98 mg/dL (ref 70–140)
Potassium: 5 mmol/L (ref 3.5–5.1)
Sodium: 140 mmol/L (ref 136–145)
TOTAL PROTEIN: 6.9 g/dL (ref 6.4–8.3)

## 2017-02-08 LAB — CBC WITH DIFFERENTIAL/PLATELET
BASOS ABS: 0 10*3/uL (ref 0.0–0.1)
BASOS PCT: 0 %
EOS PCT: 3 %
Eosinophils Absolute: 0.2 10*3/uL (ref 0.0–0.5)
HCT: 48.1 % (ref 38.4–49.9)
Hemoglobin: 16.2 g/dL (ref 13.0–17.1)
Lymphocytes Relative: 18 %
Lymphs Abs: 1.3 10*3/uL (ref 0.9–3.3)
MCH: 31.1 pg (ref 27.2–33.4)
MCHC: 33.7 g/dL (ref 32.0–36.0)
MCV: 92.3 fL (ref 79.3–98.0)
MONO ABS: 0.6 10*3/uL (ref 0.1–0.9)
Monocytes Relative: 8 %
Neutro Abs: 5.1 10*3/uL (ref 1.5–6.5)
Neutrophils Relative %: 71 %
PLATELETS: 173 10*3/uL (ref 140–400)
RBC: 5.21 MIL/uL (ref 4.20–5.82)
RDW: 12.5 % (ref 11.0–15.6)
WBC: 7.2 10*3/uL (ref 4.0–10.3)

## 2017-02-08 LAB — LACTATE DEHYDROGENASE: LDH: 182 U/L (ref 125–245)

## 2017-02-08 NOTE — Assessment & Plan Note (Signed)
Clinically, he has no signs of cancer recurrence However, his wife is concerned about progressive weight loss Due to his high risk of cancer relapse, I recommend CT scan of the chest, abdomen and pelvis for staging and he agreed to proceed

## 2017-02-08 NOTE — Telephone Encounter (Signed)
Scheduled appts per 1/29 los - patient did not want avs or calendar.  °

## 2017-02-08 NOTE — Progress Notes (Signed)
[facility] OFFICE PROGRESS NOTE  Patient Care Team: [name], MD as PCP - General (Family Medicine) [name], [name], [name], [name], MD as Consulting Physician (Dermatology)  SUMMARY OF ONCOLOGIC HISTORY: Oncology History   Low grade B-cell lymphoma   Primary site: Lymphoid Neoplasms   Staging method: AJCC 6th Edition   Clinical: Stage IV signed by [name], MD on 09/26/2013  9:15 AM   Summary: Stage IV        History of B-cell lymphoma   12/10/2003 Surgery    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      12/12/2003 - 06/01/2004 Chemotherapy    He was treated with R. CHOP chemotherapy which show complete remission. The number of cycles of R. CHOP chemotherapy was unknown.      12/19/2006 Surgery    Lung resection show follicular lymphoma.      01/02/2007 - 09/01/2008 Chemotherapy    The patient was treated with single agent rituximab alone.      01/19/2007 Bone Marrow Biopsy    Bone marrow biopsy was negative.      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      05/08/2013 - 05/11/2013 Hospital Admission    The patient was admitted to the hospital for management of pericarditis. CT scan showed extensive lymphadenopathy.      06/07/2013 Imaging    PET/CT scan showed extensive lymphadenopathy      06/25/2013 Procedure    He has placement of Infuse-a-Port.      06/28/2013 Bone Marrow Biopsy    Bone marrow biopsy is positive for lymphoma involvement.      07/04/2013 - 11/22/2013 Chemotherapy    He is treated with 6 cycles of bendamustine with rituximab.      09/24/2013 Imaging    Repeat PET scan show complete remission.      12/26/2013 Imaging    PEt scan showed complete remission      09/26/2014 Imaging    CT  scan of the chest abdomen and pelvis show no evidence of disease      03/26/2015 Imaging    CT scan showed no evidence of lymphoma      04/14/2016 Imaging    CT: Borderline prominent right hilar and subcarinal lymph nodes, but not appreciably changed. 2. Low-grade but increased central mesenteric stranding with some small mesenteric lymph nodes. This could certainly be inflammatory, 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, [name], volume estimated at 75 cubic cm.       INTERVAL HISTORY: Please see below for problem oriented charting. He returns today per patient request due to unintentional weight loss of over 7 pounds over several months His appetite is preserved He denies recent lymphadenopathy He had occasional cold but not severe enough to cause the weight loss His bowel habits has been stable He stopped exercising vigorously due to chronic back pain but continues to walk on a regular basis  REVIEW OF SYSTEMS:   Constitutional: Denies fevers, chills  Eyes: Denies blurriness of vision Ears, nose, mouth, throat, and face: Denies mucositis or sore throat Respiratory: Denies cough, dyspnea or wheezes Cardiovascular: Denies palpitation, chest discomfort or lower extremity swelling Gastrointestinal:  Denies nausea, heartburn or change in bowel habits Skin: Denies abnormal skin rashes Lymphatics: Denies new lymphadenopathy or easy bruising Neurological:Denies numbness, tingling or new  weaknesses Behavioral/Psych: Mood is stable, no new changes  All other systems were reviewed with the patient and are negative.  I have reviewed the past medical history, past surgical history, social history and family history with the patient and they are unchanged from previous note.  ALLERGIES:  has No Known Allergies.  MEDICATIONS:  Current Outpatient Medications  Medication Sig Dispense Refill  . aspirin EC 81 MG tablet Take 81 mg by mouth every morning.    . diltiazem (CARDIZEM CD) 120 MG 24 hr capsule Take 1 capsule (120 mg total) by mouth every morning. 90 capsule 3  . DULoxetine (CYMBALTA) 60 MG capsule Take 1 capsule (60 mg total) by mouth daily. 90 capsule 3  . metoprolol succinate (TOPROL XL) 25 MG 24 hr tablet Take 1 tablet (25 mg total) by mouth daily. 90 tablet 3  . Multiple Vitamin (MULTIVITAMIN WITH MINERALS) TABS tablet Take 1 tablet by mouth every morning.     Marland Kitchen omeprazole (PRILOSEC) 20 MG capsule Take 1 capsule (20 mg total) by mouth daily. 90 capsule 3  . pravastatin (PRAVACHOL) 20 MG tablet Take 1 tablet (20 mg total) by mouth daily. 90 tablet 3  . Probiotic Product (PROBIOTIC DAILY PO) Take 1 capsule by mouth daily.     . tamsulosin (FLOMAX) 0.4 MG CAPS capsule TAKE 2 CAPSULES DAILY AFTER SUPPER 180 capsule 0   No current facility-administered medications for this visit.     PHYSICAL EXAMINATION: ECOG PERFORMANCE STATUS: 1 - Symptomatic but completely ambulatory  Vitals:   02/08/17 0843  BP: 103/68  Pulse: 81  Resp: 18  Temp: 97.7 F (36.5 C)  SpO2: 97%   Filed Weights   02/08/17 0843  Weight: 190 lb 14.4 oz (86.6 kg)    GENERAL:alert, no distress and comfortable SKIN: skin color, texture, turgor are normal, no rashes or significant lesions EYES: normal, Conjunctiva are pink and non-injected, sclera clear OROPHARYNX:no exudate, no erythema and lips, buccal mucosa, and tongue normal  NECK: supple, thyroid normal size, non-tender, without  nodularity LYMPH:  no palpable lymphadenopathy in the cervical, axillary or inguinal LUNGS: clear to auscultation and percussion with normal breathing effort HEART: regular rate & rhythm and no murmurs and no lower extremity edema ABDOMEN:abdomen soft, non-tender and normal bowel sounds Musculoskeletal:no cyanosis of digits and no clubbing  NEURO: alert & oriented x 3 with fluent speech, no focal motor/sensory deficits  LABORATORY DATA:  I have reviewed the data as listed    Component Value Date/Time   NA 140 02/08/2017 0805   NA 141 01/14/2017 1119   NA 139 04/14/2016 0943   K 5.0 02/08/2017 0805   K 4.6 04/14/2016 0943   CL 105 02/08/2017 0805   CL 104 03/24/2012 1024   CO2 25 02/08/2017 0805   CO2 25 04/14/2016 0943   GLUCOSE 98 02/08/2017 0805   GLUCOSE 111 04/14/2016 0943   GLUCOSE 80 03/24/2012 1024   BUN 13 02/08/2017 0805   BUN 13 01/14/2017 1119   BUN 8.6 04/14/2016 0943   CREATININE 1.13 02/08/2017 0805   CREATININE 1.1 04/14/2016 0943  CALCIUM 9.6 02/08/2017 0805   CALCIUM 9.4 04/14/2016 0943   PROT 6.9 02/08/2017 0805   PROT 7.0 01/14/2017 1119   PROT 6.5 04/14/2016 0943   ALBUMIN 3.9 02/08/2017 0805   ALBUMIN 4.7 01/14/2017 1119   ALBUMIN 3.8 04/14/2016 0943   AST 19 02/08/2017 0805   AST 28 04/14/2016 0943   ALT 28 02/08/2017 0805   ALT 41 04/14/2016 0943   ALKPHOS 62 02/08/2017 0805   ALKPHOS 71 04/14/2016 0943   BILITOT 0.5 02/08/2017 0805   BILITOT 1.0 01/14/2017 1119   BILITOT 0.61 04/14/2016 0943   GFRNONAA >60 02/08/2017 0805   GFRAA >60 02/08/2017 0805    No results found for: SPEP, UPEP  Lab Results  Component Value Date   WBC 7.2 02/08/2017   NEUTROABS 5.1 02/08/2017   HGB 16.2 02/08/2017   HCT 48.1 02/08/2017   MCV 92.3 02/08/2017   PLT 173 02/08/2017      Chemistry      Component Value Date/Time   NA 140 02/08/2017 0805   NA 141 01/14/2017 1119   NA 139 04/14/2016 0943   K 5.0 02/08/2017 0805   K 4.6 04/14/2016 0943   CL  105 02/08/2017 0805   CL 104 03/24/2012 1024   CO2 25 02/08/2017 0805   CO2 25 04/14/2016 0943   BUN 13 02/08/2017 0805   BUN 13 01/14/2017 1119   BUN 8.6 04/14/2016 0943   CREATININE 1.13 02/08/2017 0805   CREATININE 1.1 04/14/2016 0943      Component Value Date/Time   CALCIUM 9.6 02/08/2017 0805   CALCIUM 9.4 04/14/2016 0943   ALKPHOS 62 02/08/2017 0805   ALKPHOS 71 04/14/2016 0943   AST 19 02/08/2017 0805   AST 28 04/14/2016 0943   ALT 28 02/08/2017 0805   ALT 41 04/14/2016 0943   BILITOT 0.5 02/08/2017 0805   BILITOT 1.0 01/14/2017 1119   BILITOT 0.61 04/14/2016 0943      ASSESSMENT & PLAN:  History of B-cell lymphoma Clinically, he has no signs of cancer recurrence However, his wife is concerned about progressive weight loss Due to his high risk of cancer relapse, I recommend CT scan of the chest, abdomen and pelvis for staging and he agreed to proceed  Weight loss, unintentional He has unintentional weight loss His appetite is preserved The patient has not been exercising rigorously due to back pain His recent TSH was within normal limits As above, I plan to order CT scan of the chest, abdomen and pelvis for staging and he agreed to proceed   Orders Placed This Encounter  Procedures  . CT ABDOMEN PELVIS W CONTRAST    Standing Status:   Future    Standing Expiration Date:   02/08/2018    Order Specific Question:   If indicated for the ordered procedure, I authorize the administration of contrast media per Radiology protocol    Answer:   Yes    Order Specific Question:   Preferred imaging location?    Answer:   [facility]    Order Specific Question:   Radiology Contrast Protocol - do NOT remove file path    Answer:   \\[email or web address]  . CT CHEST W CONTRAST    Standing Status:   Future    Standing Expiration Date:   02/08/2018    Order Specific Question:   If indicated for the ordered procedure, I authorize the administration  of contrast media per Radiology protocol    Answer:   Yes  Order Specific Question:   Preferred imaging location?    Answer:   [facility]    Order Specific Question:   Radiology Contrast Protocol - do NOT remove file path    Answer:   \\[email or web address]   All questions were answered. The patient knows to call the clinic with any problems, questions or concerns. No barriers to learning was detected. I spent 15 minutes counseling the patient face to face. The total time spent in the appointment was 20 minutes and more than 50% was on counseling and review of test results     [name], MD 02/08/2017 2:31 PM

## 2017-02-08 NOTE — Assessment & Plan Note (Signed)
He has unintentional weight loss His appetite is preserved The patient has not been exercising rigorously due to back pain His recent TSH was within normal limits As above, I plan to order CT scan of the chest, abdomen and pelvis for staging and he agreed to proceed

## 2017-02-15 ENCOUNTER — Ambulatory Visit (HOSPITAL_COMMUNITY)
Admission: RE | Admit: 2017-02-15 | Discharge: 2017-02-15 | Disposition: A | Discharge status: Home / Self Care | Payer: Medicare Other | Source: Ambulatory Visit | Attending: Hematology and Oncology | Admitting: Hematology and Oncology

## 2017-02-15 ENCOUNTER — Telehealth: Payer: Self-pay

## 2017-02-15 DIAGNOSIS — R634 Abnormal weight loss: Secondary | ICD-10-CM | POA: Diagnosis not present

## 2017-02-15 DIAGNOSIS — K449 Diaphragmatic hernia without obstruction or gangrene: Secondary | ICD-10-CM | POA: Insufficient documentation

## 2017-02-15 DIAGNOSIS — I2510 Atherosclerotic heart disease of native coronary artery without angina pectoris: Secondary | ICD-10-CM | POA: Diagnosis not present

## 2017-02-15 DIAGNOSIS — I700 Atherosclerosis of aorta: Secondary | ICD-10-CM | POA: Diagnosis not present

## 2017-02-15 DIAGNOSIS — J439 Emphysema, unspecified: Secondary | ICD-10-CM | POA: Insufficient documentation

## 2017-02-15 DIAGNOSIS — N400 Enlarged prostate without lower urinary tract symptoms: Secondary | ICD-10-CM | POA: Diagnosis not present

## 2017-02-15 DIAGNOSIS — Z8572 Personal history of non-Hodgkin lymphomas: Secondary | ICD-10-CM | POA: Insufficient documentation

## 2017-02-15 DIAGNOSIS — K5730 Diverticulosis of large intestine without perforation or abscess without bleeding: Secondary | ICD-10-CM | POA: Insufficient documentation

## 2017-02-15 DIAGNOSIS — C8510 Unspecified B-cell lymphoma, unspecified site: Secondary | ICD-10-CM | POA: Diagnosis not present

## 2017-02-15 MED ORDER — IOPAMIDOL (ISOVUE-300) INJECTION 61%
100.0000 mL | Freq: Once | INTRAVENOUS | Status: AC | PRN
  Administered 2017-02-15: 100 mL via INTRAVENOUS

## 2017-02-15 MED ORDER — IOPAMIDOL (ISOVUE-300) INJECTION 61%
INTRAVENOUS | Status: AC
  Filled 2017-02-15: qty 100

## 2017-02-15 NOTE — Telephone Encounter (Signed)
Spoke with pt by phone informing him that CT results are negative for lymphoma per Dr Alvy Bimler.  Appt for tomorrow will be cancelled and rescheduled for 6 months.  Pt verbalizes understanding.

## 2017-02-16 ENCOUNTER — Ambulatory Visit: Payer: Medicare Other | Admitting: Hematology and Oncology

## 2017-02-16 ENCOUNTER — Telehealth: Payer: Self-pay | Admitting: Hematology and Oncology

## 2017-02-16 NOTE — Telephone Encounter (Signed)
Mailed patient calendar of upcoming August appointments.  °

## 2017-02-17 DIAGNOSIS — Z79899 Other long term (current) drug therapy: Secondary | ICD-10-CM | POA: Diagnosis not present

## 2017-02-17 DIAGNOSIS — L905 Scar conditions and fibrosis of skin: Secondary | ICD-10-CM | POA: Diagnosis not present

## 2017-02-17 DIAGNOSIS — L820 Inflamed seborrheic keratosis: Secondary | ICD-10-CM | POA: Diagnosis not present

## 2017-02-17 DIAGNOSIS — L570 Actinic keratosis: Secondary | ICD-10-CM | POA: Diagnosis not present

## 2017-02-17 DIAGNOSIS — L821 Other seborrheic keratosis: Secondary | ICD-10-CM | POA: Diagnosis not present

## 2017-02-17 NOTE — Telephone Encounter (Signed)
Error

## 2017-02-19 ENCOUNTER — Other Ambulatory Visit: Payer: Self-pay | Admitting: Family Medicine

## 2017-02-22 ENCOUNTER — Telehealth: Payer: Self-pay | Admitting: Cardiovascular Disease

## 2017-02-22 NOTE — Telephone Encounter (Signed)
Has question about what medication patient should be taking.  PCP filled one and Dr Bronson Ing sent in other.

## 2017-02-22 NOTE — Telephone Encounter (Signed)
Pt wanted to make sure that he should be taking diltiazem and Toprol XL. Per LOV pt made aware that should be on both. Pt has f/u appt with Dr Bronson Ing on 3/25

## 2017-03-07 DIAGNOSIS — Z961 Presence of intraocular lens: Secondary | ICD-10-CM | POA: Diagnosis not present

## 2017-03-07 DIAGNOSIS — H401131 Primary open-angle glaucoma, bilateral, mild stage: Secondary | ICD-10-CM | POA: Diagnosis not present

## 2017-03-07 DIAGNOSIS — H2512 Age-related nuclear cataract, left eye: Secondary | ICD-10-CM | POA: Diagnosis not present

## 2017-03-15 ENCOUNTER — Ambulatory Visit: Payer: Medicare Other | Admitting: Family Medicine

## 2017-04-04 ENCOUNTER — Encounter: Payer: Self-pay | Admitting: Cardiovascular Disease

## 2017-04-04 ENCOUNTER — Ambulatory Visit (INDEPENDENT_AMBULATORY_CARE_PROVIDER_SITE_OTHER): Payer: Medicare Other | Admitting: Cardiovascular Disease

## 2017-04-04 VITALS — BP 116/72 | HR 68 | Ht 74.0 in | Wt 195.2 lb

## 2017-04-04 DIAGNOSIS — I519 Heart disease, unspecified: Secondary | ICD-10-CM

## 2017-04-04 DIAGNOSIS — I2510 Atherosclerotic heart disease of native coronary artery without angina pectoris: Secondary | ICD-10-CM | POA: Diagnosis not present

## 2017-04-04 DIAGNOSIS — I5189 Other ill-defined heart diseases: Secondary | ICD-10-CM

## 2017-04-04 DIAGNOSIS — I319 Disease of pericardium, unspecified: Secondary | ICD-10-CM | POA: Diagnosis not present

## 2017-04-04 DIAGNOSIS — I480 Paroxysmal atrial fibrillation: Secondary | ICD-10-CM | POA: Diagnosis not present

## 2017-04-04 DIAGNOSIS — R002 Palpitations: Secondary | ICD-10-CM | POA: Diagnosis not present

## 2017-04-04 NOTE — Addendum Note (Signed)
Addended by: Laurine Blazer on: 04/04/2017 01:21 PM   Modules accepted: Orders

## 2017-04-04 NOTE — Progress Notes (Signed)
SUBJECTIVE: The patient presents for routine annual follow-up.  ECG performed today which I personally reviewed demonstrates sinus rhythm with frequent PACs.  He denies chest pain, shortness of breath, lightheadedness, dizziness, fatigue, and syncope.  He seldom has palpitations, about twice since his last visit with me a year ago.    Review of Systems: As per "subjective", otherwise negative.  No Known Allergies  Current Outpatient Medications  Medication Sig Dispense Refill  . aspirin EC 81 MG tablet Take 81 mg by mouth every morning.    . diltiazem (CARDIZEM CD) 120 MG 24 hr capsule Take 1 capsule (120 mg total) by mouth every morning. 90 capsule 3  . DULoxetine (CYMBALTA) 60 MG capsule Take 1 capsule (60 mg total) by mouth daily. 90 capsule 3  . LUMIGAN 0.01 % SOLN Place 1 drop into both eyes daily.    . metoprolol succinate (TOPROL XL) 25 MG 24 hr tablet Take 1 tablet (25 mg total) by mouth daily. 90 tablet 3  . Multiple Vitamin (MULTIVITAMIN WITH MINERALS) TABS tablet Take 1 tablet by mouth every morning.     Marland Kitchen omeprazole (PRILOSEC) 20 MG capsule Take 1 capsule (20 mg total) by mouth daily. 90 capsule 3  . pravastatin (PRAVACHOL) 20 MG tablet Take 1 tablet (20 mg total) by mouth daily. 90 tablet 3  . Probiotic Product (PROBIOTIC DAILY PO) Take 1 capsule by mouth daily.     . tamsulosin (FLOMAX) 0.4 MG CAPS capsule TAKE 2 CAPSULES DAILY AFTER SUPPER 180 capsule 0   No current facility-administered medications for this visit.     Past Medical History:  Diagnosis Date  . Acute pericarditis    a. 04/2013 -adm with CP, elevated CRP. H/o coronary artery calcification on prior CT but nuc was negative, EF 71%.  . Arthritis   . Atrial fibrillation ([place])    a. Isolated episode in the setting of acute pericarditis 04/2013. Was not placed on anticoag.  Marland Kitchen BPH (benign prostatic hyperplasia)   . Cataract   . Diverticulitis 08/16/2013  . Hyperlipidemia    elevated triglycerides  .  Low grade B-cell lymphoma ([place]) 05/11/2011   Initial dx 6/04 left inguinal adenopathy Rx observation; convert to hi grade 11/05 Rx CHOP-R; lesion right lung resected 12/08: low grade NHL; new lesion left submandibular gland 2/13  resected 04/30/11 lo grade NHL  . Malignant lymphoma, high grade ([place]) 03/14/2011  . 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 Pain in joint, pelvic region and thigh 08/01/2013  . PSVT (paroxysmal supraventricular tachycardia) ([place])     Past Surgical History:  Procedure Laterality Date  . CHOLECYSTECTOMY    . EXPLORATORY LAPAROTOMY    . EYE SURGERY  2016   cataract  . LUNG LOBECTOMY     right side  . LYMPH NODE BIOPSY     in groin with removal  . MENISCUS REPAIR     right knee  . PROSTATE SURGERY    . REFRACTIVE SURGERY Right    piece of metal removed  . SUBMANDIBULAR GLAND EXCISION  04/2011  . SUBMANDIBULAR GLAND EXCISION  04/30/2011   Procedure: EXCISION SUBMANDIBULAR GLAND;  Surgeon: Jerrell Belfast, MD;  Location: [facility];  Service: ENT;  Laterality: Left;  WITH DIAGNOSTIC BIOPSY  . TONSILLECTOMY     as a child  . VEIN LIGATION AND STRIPPING     right leg  Social History   Socioeconomic History  . Marital status: Married    Spouse name: Not on file  . Number of children: Not on file  . Years of education: Not on file  . Highest education level: Not on file  Occupational History  . Not on file  Social Needs  . Financial resource strain: Not on file  . Food insecurity:    Worry: Not on file    Inability: Not on file  . Transportation needs:    Medical: Not on file    Non-medical: Not on file  Tobacco Use  . Smoking status: Former Smoker    Packs/day: 1.50    Years: 25.00    Pack years: 37.50    Types: Cigarettes    Start date: 12/12/1962    Last attempt to quit: 01/11/1990    Years since quitting: 27.2  . Smokeless tobacco: Never Used  Substance and Sexual Activity  .  Alcohol use: No    Alcohol/week: 0.0 oz  . Drug use: No  . Sexual activity: Not Currently  Lifestyle  . Physical activity:    Days per week: Not on file    Minutes per session: Not on file  . Stress: Not on file  Relationships  . Social connections:    Talks on phone: Not on file    Gets together: Not on file    Attends religious service: Not on file    Active member of club or organization: Not on file    Attends meetings of clubs or organizations: Not on file    Relationship status: Not on file  . Intimate partner violence:    Fear of current or ex partner: Not on file    Emotionally abused: Not on file    Physically abused: Not on file    Forced sexual activity: Not on file  Other Topics Concern  . Not on file  Social History Narrative  . Not on file     Vitals:   04/04/17 1302  Weight: 195 lb 3.2 oz (88.5 kg)  Height: 6\' 2"  (1.88 m)    Wt Readings from Last 3 Encounters:  04/04/17 195 lb 3.2 oz (88.5 kg)  02/08/17 190 lb 14.4 oz (86.6 kg)  01/14/17 192 lb (87.1 kg)     PHYSICAL EXAM General: NAD HEENT: Normal. Neck: No JVD, no thyromegaly. Lungs: Clear to auscultation bilaterally with normal respiratory effort. CV: Regular rate and rhythm, normal S1/S2, no S3/S4, no murmur. No pretibial or periankle edema.  No carotid bruit.   Abdomen: Soft, nontender, no distention.  Neurologic: Alert and oriented.  Psych: Normal affect. Skin: Normal. Musculoskeletal: No gross deformities.    ECG: Most recent ECG reviewed.   Labs: Lab Results  Component Value Date/Time   K 5.0 02/08/2017 08:05 AM   K 4.6 04/14/2016 09:43 AM   BUN 13 02/08/2017 08:05 AM   BUN 13 01/14/2017 11:19 AM   BUN 8.6 04/14/2016 09:43 AM   CREATININE 1.13 02/08/2017 08:05 AM   CREATININE 1.1 04/14/2016 09:43 AM   ALT 28 02/08/2017 08:05 AM   ALT 41 04/14/2016 09:43 AM   TSH 1.690 01/14/2017 11:19 AM   HGB 16.2 02/08/2017 08:05 AM   HGB 16.7 01/14/2017 11:19 AM   HGB 15.6 04/14/2016  09:42 AM     Lipids: Lab Results  Component Value Date/Time   LDLCALC 49 01/14/2017 11:19 AM   LDLCALC 77 05/30/2012 09:09 AM   CHOL 126 01/14/2017 11:19 AM  CHOL 144 05/30/2012 09:09 AM   TRIG 153 (H) 01/14/2017 11:19 AM   TRIG 140 05/20/2014 11:03 AM   TRIG 153 (H) 05/30/2012 09:09 AM   HDL 46 01/14/2017 11:19 AM   HDL 42 05/20/2014 11:03 AM   HDL 36 (L) 05/30/2012 09:09 AM       ASSESSMENT AND PLAN:  1. Pericarditis: No recurrence of symptoms.  2. PSVT/PAF: Symptomatically stable.  No recurrences. This was reportedly an isolated episode in the setting of acute pericarditis. Thus, was not placed on anticoagulation. Continue long-acting diltiazem and long-acting metoprolol.   3. Coronary artery calcifications on CT: Symptomatically stable. Normal nuclear MPI study on 05/11/13, thus no further testing is indicated at this time. Continue beta blocker and statin therapy.  I will discontinue aspirin.  4. Grade II diastolic dysfunction: No evidence of heart failure. BP is normal.     Disposition: Follow up 1 year   Kate Sable, M.D., F.A.C.C.

## 2017-04-04 NOTE — Patient Instructions (Signed)

## 2017-04-13 ENCOUNTER — Other Ambulatory Visit: Payer: Medicare Other

## 2017-04-14 ENCOUNTER — Ambulatory Visit: Payer: Medicare Other | Admitting: Hematology and Oncology

## 2017-04-18 DIAGNOSIS — L821 Other seborrheic keratosis: Secondary | ICD-10-CM | POA: Diagnosis not present

## 2017-04-18 DIAGNOSIS — D1801 Hemangioma of skin and subcutaneous tissue: Secondary | ICD-10-CM | POA: Diagnosis not present

## 2017-04-18 DIAGNOSIS — D692 Other nonthrombocytopenic purpura: Secondary | ICD-10-CM | POA: Diagnosis not present

## 2017-04-18 DIAGNOSIS — L905 Scar conditions and fibrosis of skin: Secondary | ICD-10-CM | POA: Diagnosis not present

## 2017-04-18 DIAGNOSIS — L570 Actinic keratosis: Secondary | ICD-10-CM | POA: Diagnosis not present

## 2017-04-25 ENCOUNTER — Encounter: Payer: Self-pay | Admitting: Family Medicine

## 2017-04-26 ENCOUNTER — Ambulatory Visit (INDEPENDENT_AMBULATORY_CARE_PROVIDER_SITE_OTHER): Payer: Medicare Other

## 2017-04-26 ENCOUNTER — Ambulatory Visit (INDEPENDENT_AMBULATORY_CARE_PROVIDER_SITE_OTHER): Payer: Medicare Other | Admitting: Family Medicine

## 2017-04-26 ENCOUNTER — Encounter: Payer: Self-pay | Admitting: Family Medicine

## 2017-04-26 VITALS — BP 127/81 | HR 79 | Temp 96.7°F | Ht 74.0 in | Wt 195.0 lb

## 2017-04-26 DIAGNOSIS — G4483 Primary cough headache: Secondary | ICD-10-CM

## 2017-04-26 DIAGNOSIS — H1132 Conjunctival hemorrhage, left eye: Secondary | ICD-10-CM | POA: Diagnosis not present

## 2017-04-26 DIAGNOSIS — R059 Cough, unspecified: Secondary | ICD-10-CM

## 2017-04-26 DIAGNOSIS — R0602 Shortness of breath: Secondary | ICD-10-CM

## 2017-04-26 DIAGNOSIS — J159 Unspecified bacterial pneumonia: Secondary | ICD-10-CM

## 2017-04-26 DIAGNOSIS — R05 Cough: Secondary | ICD-10-CM

## 2017-04-26 MED ORDER — AZITHROMYCIN 250 MG PO TABS: ORAL_TABLET | ORAL | 0 refills | Status: DC

## 2017-04-26 MED ORDER — HYDROCOD POLST-CPM POLST ER 10-8 MG/5ML PO SUER: 5.0000 mL | Freq: Two times a day (BID) | ORAL | 0 refills | Status: DC | PRN

## 2017-04-26 NOTE — Patient Instructions (Addendum)
He had a chest x-ray done today which appears to have a small pneumonia in the right lung fields.  I have prescribed you an antibiotic and a cough medication to use.  Please take these as directed.  If your symptoms worsen, please seek immediate medical attention.  I recommend that you only use cold medications that are safe in high blood pressure like Coricidin (generic is fine).  Other cold medications can increase your blood pressure.    - Get plenty of rest and drink plenty of fluids. - Try to breathe moist air. Use a cold mist humidifier. - Consume warm fluids (soup or tea) to provide relief for a stuffy nose and to loosen phlegm. - For nasal stuffiness, try saline nasal spray or a Neti Pot.  Afrin nasal spray can also be used but this product should not be used longer than 3 days or it will cause rebound nasal stuffiness (worsening nasal congestion). - For sore throat pain relief: suck on throat lozenges, hard candy or popsicles; gargle with warm salt water (1/4 tsp. salt per 8 oz. of water); and eat soft, bland foods. - Eat a well-balanced diet. If you cannot, ensure you are getting enough nutrients by taking a daily multivitamin. - Avoid dairy products, as they can thicken phlegm. - Avoid alcohol, as it impairs your body's immune system.   Community-Acquired Pneumonia, Adult Pneumonia is an infection of the lungs. One type of pneumonia can happen while a person is in a hospital. A different type can happen when a person is not in a hospital (community-acquired pneumonia). It is easy for this kind to spread from person to person. It can spread to you if you breathe near an infected person who coughs or sneezes. Some symptoms include:  A dry cough.  A wet (productive) cough.  Fever.  Sweating.  Chest pain.  Follow these instructions at home:  Take over-the-counter and prescription medicines only as told by your doctor. ? Only take cough medicine if you are losing sleep. ? If  you were prescribed an antibiotic medicine, take it as told by your doctor. Do not stop taking the antibiotic even if you start to feel better.  Sleep with your head and neck raised (elevated). You can do this by putting a few pillows under your head, or you can sleep in a recliner.  Do not use tobacco products. These include cigarettes, chewing tobacco, and e-cigarettes. If you need help quitting, ask your doctor.  Drink enough water to keep your pee (urine) clear or pale yellow. A shot (vaccine) can help prevent pneumonia. Shots are often suggested for:  People older than 73 years of age.  People older than 73 years of age: ? Who are having cancer treatment. ? Who have long-term (chronic) lung disease. ? Who have problems with their body's defense system (immune system).  You may also prevent pneumonia if you take these actions:  Get the flu (influenza) shot every year.  Go to the dentist as often as told.  Wash your hands often. If soap and water are not available, use hand sanitizer.  Contact a doctor if:  You have a fever.  You lose sleep because your cough medicine does not help. Get help right away if:  You are short of breath and it gets worse.  You have more chest pain.  Your sickness gets worse. This is very serious if: ? You are an older adult. ? Your body's defense system is weak.  You cough  up blood. This information is not intended to replace advice given to you by your health care provider. Make sure you discuss any questions you have with your health care provider. Document Released: 06/16/2007 Document Revised: 06/05/2015 Document Reviewed: 04/24/2014 Elsevier Interactive Patient Education  [employer].

## 2017-04-26 NOTE — Progress Notes (Signed)
Subjective: CC: URi PCP: [name], MD OVF:IEPPIR Anthony Kelly is a 71 y.o. male presenting to clinic today for:  1. Cold symptoms  Patient reports dry cough, headache, burning in the chest with coughing, ringing in his ears and shortness of breath that started Saturday.  He reports associated rhinorrhea intermittently.  No known sick contacts but he was visiting someone in the hospital over the weekend.  Denies hemoptysis, congestion, dizziness, rash, nausea, vomiting, diarrhea, fevers, chills, myalgia, recent travel.  Patient has used Mucinex, Tylenol and Nasacort with little relief of symptoms.  No history of COPD or asthma.  Former tobacco use/ exposure, quit several decades ago.   ROS: Per HPI  No Known Allergies Past Medical History:  Diagnosis Date  . Acute pericarditis    a. 04/2013 -adm with CP, elevated CRP. H/o coronary artery calcification on prior CT but nuc was negative, EF 71%.  . Arthritis   . Atrial fibrillation ([facility])    a. Isolated episode in the setting of acute pericarditis 04/2013. Was not placed on anticoag.  Marland Kitchen BPH (benign prostatic hyperplasia)   . Cataract   . Diverticulitis 08/16/2013  . Hyperlipidemia    elevated triglycerides  . 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 Pain in joint, pelvic region and thigh 08/01/2013  . PSVT (paroxysmal supraventricular tachycardia) ([facility])     Current Outpatient Medications:  .  diltiazem (CARDIZEM CD) 120 MG 24 hr capsule, Take 1 capsule (120 mg total) by mouth every morning., Disp: 90 capsule, Rfl: 3 .  DULoxetine (CYMBALTA) 60 MG capsule, Take  1 capsule (60 mg total) by mouth daily., Disp: 90 capsule, Rfl: 3 .  LUMIGAN 0.01 % SOLN, Place 1 drop into both eyes daily., Disp: , Rfl:  .  metoprolol succinate (TOPROL XL) 25 MG 24 hr tablet, Take 1 tablet (25 mg total) by mouth daily., Disp: 90 tablet, Rfl: 3 .  Multiple Vitamin (MULTIVITAMIN WITH MINERALS) TABS tablet, Take 1 tablet by mouth every morning. , Disp: , Rfl:  .  omeprazole (PRILOSEC) 20 MG capsule, Take 1 capsule (20 mg total) by mouth daily., Disp: 90 capsule, Rfl: 3 .  pravastatin (PRAVACHOL) 20 MG tablet, Take 1 tablet (20 mg total) by mouth daily., Disp: 90 tablet, Rfl: 3 .  Probiotic Product (PROBIOTIC DAILY PO), Take 1 capsule by mouth daily. , Disp: , Rfl:  .  tamsulosin (FLOMAX) 0.4 MG CAPS capsule, TAKE 2 CAPSULES DAILY AFTER SUPPER, Disp: 180 capsule, Rfl: 0 Social History   Socioeconomic History  . Marital status: Married    Spouse name: Not on file  . Number of children: Not on file  . Years of education: Not on file  . Highest education level: Not on file  Occupational History  . Not on file  Social Needs  . Financial resource strain: Not on file  . Food insecurity:    Worry: Not on file    Inability: Not on file  . Transportation needs:    Medical: Not on file    Non-medical: Not on file  Tobacco Use  . Smoking status: Former  Smoker    Packs/day: 1.50    Years: 25.00    Pack years: 37.50    Types: Cigarettes    Start date: 12/12/1962    Last attempt to quit: 01/11/1990    Years since quitting: 27.3  . Smokeless tobacco: Never Used  Substance and Sexual Activity  . Alcohol use: No    Alcohol/week: 0.0 oz  . Drug use: No  . Sexual activity: Not Currently  Lifestyle  . Physical activity:    Days per week: Not on file    Minutes per session: Not on file  . Stress: Not on file  Relationships  . Social connections:    Talks on phone: Not on file    Gets together: Not on file    Attends religious service: Not on file    Active member of club  or organization: Not on file    Attends meetings of clubs or organizations: Not on file    Relationship status: Not on file  . Intimate partner violence:    Fear of current or ex partner: Not on file    Emotionally abused: Not on file    Physically abused: Not on file    Forced sexual activity: Not on file  Other Topics Concern  . Not on file  Social History Narrative  . Not on file   Family History  Problem Relation Age of Onset  . Heart disease Father   . Heart attack Father        x 3  . Cancer Sister        breast ca  . Cancer Brother        prostate ca  . Heart attack Sister   . Cancer Sister        breast  . Heart disease Brother   . Heart disease Brother   . Diabetes Brother   . Heart disease Brother   . Heart disease Brother   . Heart disease Brother   . Heart disease Brother   . Heart disease Brother   . Anesthesia problems Neg Hx     Objective: Office vital signs reviewed. BP 127/81   Pulse 79   Temp (!) 96.7 F (35.9 C) (Oral)   Ht 6\' 2"  (1.88 m)   Wt 195 lb (88.5 kg)   SpO2 97%   BMI 25.04 kg/m   Physical Examination:  General: Awake, alert, well nourished, tired appearing. No acute distress HEENT: Normal    Neck: No masses palpated. No lymphadenopathy    Ears: Tympanic membranes intact, normal light reflex, no erythema, no bulging    Eyes: PERRLA, extraocular movement intact, subconjunctival hemorrhage noted on left.    Nose: nasal turbinates moist, clear nasal discharge    Throat: moist mucus membranes, no erythema, no tonsillar exudate.  Airway is patent Cardio: irregularly irregular, S1S2 heard, no murmurs appreciated Pulm: clear to auscultation bilaterally, no wheezes, rhonchi or rales; normal work of breathing on room air  Dg Chest 2 View  Result Date: 04/26/2017 CLINICAL DATA:  Cough, shortness of breath EXAM: CHEST - 2 VIEW COMPARISON:  none FINDINGS: Coarse attenuated bronchovascular markings. No confluent infiltrate or overt edema.  Surgical clips and staples at the right hilum. Heart size and mediastinal contours are within normal limits. Aortic Atherosclerosis (ICD10-170.0) No effusion.  No pneumothorax. Visualized bones unremarkable. Fluid level in moderate hiatal hernia. Cholecystectomy clips. IMPRESSION: No acute cardiopulmonary disease. Postop findings as above. Hiatal hernia. Electronically Signed   By: Lucrezia Europe  M.D.   On: 04/26/2017 08:55    Assessment/ Plan: 72 y.o. male   1. Community acquired bacterial pneumonia Patient is afebrile, nontoxic-appearing with normal vital signs on room air.  Personal review of chest x-ray with right middle lobe with what appears to be an infiltrate. Radiologist read did not appreciate any acute pulmonary findings.  I have prescribed patient a Z-Pak and Tussionex.  The Narcotic Database has been reviewed.  There were no red flags.  Home care instructions were reviewed.  Handout provided.  Reasons for reevaluation and emergent evaluation in the emergency department discussed.  Patient was good understanding will follow-up as needed.  2. Cough in adult - DG Chest 2 View; Future  3. Shortness of breath - DG Chest 2 View; Future  4. Cough headache May continue Tylenol.  Cough suppression as above.  5. Subconjunctival hemorrhage of left eye Likely secondary to cough.  Reassurance.   Orders Placed This Encounter  Procedures  . DG Chest 2 View    Standing Status:   Future    Number of Occurrences:   1    Standing Expiration Date:   06/27/2018    Order Specific Question:   Reason for Exam (SYMPTOM  OR DIAGNOSIS REQUIRED)    Answer:   non productive cough x3 days, shortness of breath    Order Specific Question:   Preferred imaging location?    Answer:   Internal    Order Specific Question:   Radiology Contrast Protocol - do NOT remove file path    Answer:   [email or web address]   Meds ordered this encounter  Medications  . DISCONTD:  chlorpheniramine-HYDROcodone (TUSSIONEX) 10-8 MG/5ML SUER    Sig: Take 5 mLs by mouth every 12 (twelve) hours as needed for cough.    Dispense:  60 mL    Refill:  0  . azithromycin (ZITHROMAX Z-PAK) 250 MG tablet    Sig: Take 500mg  day 1, then take 250mg  days 2-5.    Dispense:  6 tablet    Refill:  0  . chlorpheniramine-HYDROcodone (TUSSIONEX) 10-8 MG/5ML SUER    Sig: Take 5 mLs by mouth every 12 (twelve) hours as needed for cough.    Dispense:  60 mL    Refill:  [place], DO [facility] [phone]

## 2017-04-27 ENCOUNTER — Encounter: Payer: Self-pay | Admitting: Family Medicine

## 2017-05-21 ENCOUNTER — Other Ambulatory Visit: Payer: Self-pay | Admitting: Family Medicine

## 2017-05-23 ENCOUNTER — Other Ambulatory Visit: Payer: Self-pay | Admitting: Family Medicine

## 2017-05-24 MED ORDER — TAMSULOSIN HCL 0.4 MG PO CAPS: ORAL_CAPSULE | ORAL | 0 refills | Status: DC

## 2017-05-24 MED ORDER — PRAVASTATIN SODIUM 20 MG PO TABS: 20.0000 mg | ORAL_TABLET | Freq: Every day | ORAL | 0 refills | Status: DC

## 2017-05-24 NOTE — Addendum Note (Signed)
Addended by: Antonietta Barcelona D on: 05/24/2017 08:40 AM   Modules accepted: Orders

## 2017-05-24 NOTE — Addendum Note (Signed)
Addended by: Antonietta Barcelona D on: 05/24/2017 08:39 AM   Modules accepted: Orders

## 2017-05-27 ENCOUNTER — Telehealth: Payer: Self-pay | Admitting: Family Medicine

## 2017-05-30 NOTE — Telephone Encounter (Signed)
Notified patients wife. Patients wife verbalized understanding

## 2017-05-30 NOTE — Telephone Encounter (Signed)
Pt requesting Rx for hemp oil.  No we do not prescribe it as it is over the counter.   I do not have strong opinions on the product yet as it is just now catching on.   Laroy Apple, MD [facility] 05/30/2017, 7:43 AM

## 2017-06-06 DIAGNOSIS — H2512 Age-related nuclear cataract, left eye: Secondary | ICD-10-CM | POA: Diagnosis not present

## 2017-06-06 DIAGNOSIS — H401131 Primary open-angle glaucoma, bilateral, mild stage: Secondary | ICD-10-CM | POA: Diagnosis not present

## 2017-06-06 DIAGNOSIS — Z961 Presence of intraocular lens: Secondary | ICD-10-CM | POA: Diagnosis not present

## 2017-06-07 ENCOUNTER — Encounter: Payer: Self-pay | Admitting: Hematology and Oncology

## 2017-06-07 ENCOUNTER — Encounter: Payer: Self-pay | Admitting: Cardiovascular Disease

## 2017-06-07 ENCOUNTER — Ambulatory Visit (INDEPENDENT_AMBULATORY_CARE_PROVIDER_SITE_OTHER): Payer: Medicare Other | Admitting: Family Medicine

## 2017-06-07 ENCOUNTER — Encounter: Payer: Self-pay | Admitting: Family Medicine

## 2017-06-07 VITALS — BP 118/78 | HR 76 | Temp 97.0°F | Ht 74.0 in | Wt 190.8 lb

## 2017-06-07 DIAGNOSIS — R310 Gross hematuria: Secondary | ICD-10-CM

## 2017-06-07 DIAGNOSIS — I2510 Atherosclerotic heart disease of native coronary artery without angina pectoris: Secondary | ICD-10-CM | POA: Diagnosis not present

## 2017-06-07 DIAGNOSIS — H9313 Tinnitus, bilateral: Secondary | ICD-10-CM | POA: Diagnosis not present

## 2017-06-07 LAB — URINALYSIS, COMPLETE
Bilirubin, UA: NEGATIVE
Glucose, UA: NEGATIVE
Ketones, UA: NEGATIVE
LEUKOCYTES UA: NEGATIVE
NITRITE UA: NEGATIVE
Protein, UA: NEGATIVE
RBC, UA: NEGATIVE
Specific Gravity, UA: 1.015 (ref 1.005–1.030)
Urobilinogen, Ur: 0.2 mg/dL (ref 0.2–1.0)
pH, UA: 5.5 (ref 5.0–7.5)

## 2017-06-07 LAB — MICROSCOPIC EXAMINATION
BACTERIA UA: NONE SEEN
EPITHELIAL CELLS (NON RENAL): NONE SEEN /HPF (ref 0–10)
Renal Epithel, UA: NONE SEEN /hpf
WBC, UA: NONE SEEN /hpf (ref 0–5)

## 2017-06-07 NOTE — Progress Notes (Signed)
   HPI  Patient presents today with unusual sound in his ears as well as gross hematuria.  Explains he has had one episode of gross hematuria that happened midstream and resolved by the end of the urinary stream.  His wife came and saw when he was urinating blood.  It has not recurred. He has history of B-cell lymphoma.  Patient also describes a "swishing sound in his head".  He states that it comes and goes is been going on for about 2 weeks. He started CBD oil 3 weeks ago and has done very well with joint pain relief.  He states that his wife has done extensive reading and there is no report of swishing sound in the head or ear ringing from the medication.  He has had mild intermittent headache.  He did have ear ringing but about 3 weeks ago before the swishing sound started  PMH: Smoking status noted ROS: Per HPI  Objective: BP 118/78   Pulse 76   Temp (!) 97 F (36.1 C) (Oral)   Ht 6\' 2"  (1.88 m)   Wt 190 lb 12.8 oz (86.5 kg)   BMI 24.50 kg/m  Gen: NAD, alert, cooperative with exam HEENT: NCAT, PERRLA, TMs normal bilaterally CV: RRR, good S1/S2, no murmur Resp: CTABL, no wheezes, non-labored Ext: No edema, warm Neuro: Alert and oriented, No gross deficits  Assessment and plan:  #Tenderness Tinnitus is I believe the most accurate description of what is going on here, however it is not typical tinitus with ear ringing, patient describes an unusual sensation of swishing in his head. His TMs are normal bilaterally Reassurance provided Given history of B-cell lymphoma he will check in with his oncologist as well  #Gross hematuria 'single episode Urinalysis and urine culture today Refer to urology, given history of B-cell lymphoma I would recommend thorough work-up for gross hematuria.    Orders Placed This Encounter  Procedures  . Urine Culture  . Urinalysis, Complete    Laroy Apple, MD [facility] 06/07/2017, 8:43 AM

## 2017-06-07 NOTE — Patient Instructions (Signed)
Great to see you!   

## 2017-06-08 LAB — URINE CULTURE

## 2017-07-11 ENCOUNTER — Ambulatory Visit (INDEPENDENT_AMBULATORY_CARE_PROVIDER_SITE_OTHER): Payer: Medicare Other | Admitting: Family Medicine

## 2017-07-11 ENCOUNTER — Ambulatory Visit: Payer: Medicare Other | Admitting: Family Medicine

## 2017-07-11 VITALS — BP 121/80 | HR 63 | Temp 97.1°F | Ht 74.0 in | Wt 188.0 lb

## 2017-07-11 DIAGNOSIS — E785 Hyperlipidemia, unspecified: Secondary | ICD-10-CM

## 2017-07-11 DIAGNOSIS — E1169 Type 2 diabetes mellitus with other specified complication: Secondary | ICD-10-CM | POA: Insufficient documentation

## 2017-07-11 DIAGNOSIS — K219 Gastro-esophageal reflux disease without esophagitis: Secondary | ICD-10-CM

## 2017-07-11 DIAGNOSIS — R7303 Prediabetes: Secondary | ICD-10-CM | POA: Diagnosis not present

## 2017-07-11 DIAGNOSIS — R6889 Other general symptoms and signs: Secondary | ICD-10-CM

## 2017-07-11 DIAGNOSIS — I2510 Atherosclerotic heart disease of native coronary artery without angina pectoris: Secondary | ICD-10-CM | POA: Diagnosis not present

## 2017-07-11 DIAGNOSIS — G629 Polyneuropathy, unspecified: Secondary | ICD-10-CM

## 2017-07-11 LAB — BAYER DCA HB A1C WAIVED: HB A1C: 5.6 % (ref <7.0)

## 2017-07-11 NOTE — Progress Notes (Signed)
BP 121/80   Pulse 63   Temp (!) 97.1 F (36.2 C) (Oral)   Ht '6\' 2"'  (1.88 m)   Wt 188 lb (85.3 kg)   BMI 24.14 kg/m    Subjective:    Patient ID: Anthony Kelly, male    DOB: [birth year], 72 y.o.   MRN: [id number]  HPI: Anthony Kelly is a 72 y.o. male presenting on 07/11/2017 for Hyperlipidemia (pt is fasting) and Legs tingling, feet hurt (bilateral, began about a month ago)   HPI Hyperlipidemia Patient is coming in for recheck of his hyperlipidemia. The patient is currently taking pravastatin. They deny any issues with myalgias or history of liver damage from it. They deny any focal numbness or weakness or chest pain.   GERD Patient is currently on omeprazole.  She denies any major symptoms or abdominal pain or belching or burping. She denies any blood in her stool or lightheadedness or dizziness.   Prediabetes Patient comes in today for recheck of his diabetes. Patient has been currently taking no medication but is trying diet control we are just monitoring for now. Patient is not currently on an ACE inhibitor/ARB. Patient has not seen an ophthalmologist this year. Patient still complains of neuropathy and numbness in both of his lower extremities and says that it is worse with prolonged sitting and he feels it in both legs about mid calf down through his foot.  He feels like it is more of a circulation problem and wants to get checked out for blood flow and circulation.   Relevant past medical, surgical, family and social history reviewed and updated as indicated. Interim medical history since our last visit reviewed. Allergies and medications reviewed and updated.  Review of Systems  Constitutional: Negative for chills and fever.  Respiratory: Negative for shortness of breath and wheezing.   Cardiovascular: Negative for chest pain and leg swelling.  Gastrointestinal: Negative for abdominal pain.  Musculoskeletal: Negative for back pain and gait problem.  Skin: Negative for rash.    Neurological: Positive for numbness. Negative for weakness, light-headedness and headaches.  All other systems reviewed and are negative.   Per HPI unless specifically indicated above   Allergies as of 07/11/2017   No Known Allergies     Medication List        Accurate as of 07/11/17 11:42 AM. Always use your most recent med list.          diltiazem 120 MG 24 hr capsule Commonly known as:  CARDIZEM CD Take 1 capsule (120 mg total) by mouth every morning.   LUMIGAN 0.01 % Soln Generic drug:  bimatoprost Place 1 drop into both eyes daily.   metoprolol succinate 25 MG 24 hr tablet Commonly known as:  TOPROL XL Take 1 tablet (25 mg total) by mouth daily.   multivitamin with minerals Tabs tablet Take 1 tablet by mouth every morning.   omeprazole 20 MG capsule Commonly known as:  PRILOSEC Take 1 capsule (20 mg total) by mouth daily.   pravastatin 20 MG tablet Commonly known as:  PRAVACHOL Take 1 tablet (20 mg total) by mouth daily.   PROBIOTIC DAILY PO Take 1 capsule by mouth daily.   tamsulosin 0.4 MG Caps capsule Commonly known as:  FLOMAX TAKE 2 CAPSULES DAILY AFTER SUPPER          Objective:    BP 121/80   Pulse 63   Temp (!) 97.1 F (36.2 C) (Oral)   Ht '6\' 2"'  (1.88 m)  Wt 188 lb (85.3 kg)   BMI 24.14 kg/m   Wt Readings from Last 3 Encounters:  07/11/17 188 lb (85.3 kg)  06/07/17 190 lb 12.8 oz (86.5 kg)  04/26/17 195 lb (88.5 kg)    Physical Exam  Constitutional: He is oriented to person, place, and time. He appears well-developed and well-nourished. No distress.  Eyes: Conjunctivae are normal. No scleral icterus.  Neck: Neck supple. No thyromegaly present.  Cardiovascular: Normal rate, regular rhythm, normal heart sounds and intact distal pulses.  No murmur heard. Pulmonary/Chest: Effort normal and breath sounds normal. No respiratory distress. He has no wheezes.  Abdominal: Soft. Bowel sounds are normal. He exhibits no distension. There is no  tenderness.  Musculoskeletal: Normal range of motion. He exhibits no edema.  Lymphadenopathy:    He has no cervical adenopathy.  Neurological: He is alert and oriented to person, place, and time. A sensory deficit (Decreased sensation in both lower extremities, also decreased hair growth on his lower legs) is present. Coordination normal.  Skin: Skin is warm and dry. No rash noted. He is not diaphoretic.  Psychiatric: He has a normal mood and affect. His behavior is normal.  Nursing note and vitals reviewed.   Diabetic Foot Exam - Simple   Simple Foot Form Diabetic Foot exam was performed with the following findings:  Yes 07/11/2017 11:42 AM  Visual Inspection No deformities, no ulcerations, no other skin breakdown bilaterally:  Yes Sensation Testing Intact to touch and monofilament testing bilaterally:  Yes Pulse Check Posterior Tibialis and Dorsalis pulse intact bilaterally:  Yes Comments Patient says he has numbness but is able to distinguish on fine touch throughout.        Assessment & Plan:   Problem List Items Addressed This Visit      Digestive   GERD (gastroesophageal reflux disease) - Primary   Relevant Orders   CBC with Differential/Platelet (Completed)     Other   Hyperlipidemia   Relevant Orders   Lipid panel (Completed)   Prediabetes   Relevant Orders   Microalbumin / creatinine urine ratio (Completed)   Bayer DCA Hb A1c Waived (Completed)   CMP14+EGFR (Completed)    Other Visit Diagnoses    Neuropathy       Bilateral lower extremity neuropathy, worse with prolonged sitting, will do ABIs   Relevant Orders   CT ANGIO AO+BIFEM W & OR WO CONTRAST   Abnormal ankle brachial index (ABI)       Relevant Orders   CT ANGIO AO+BIFEM W & OR WO CONTRAST      ABI: 1.62  Follow up plan: Return in about 6 months (around 01/11/2018), or if symptoms worsen or fail to improve, for Recheck cholesterol and GERD.  Counseling provided for all of the vaccine  components Orders Placed This Encounter  Procedures  . CT ANGIO AO+BIFEM W & OR WO CONTRAST  . Microalbumin / creatinine urine ratio  . Bayer DCA Hb A1c Waived  . CMP14+EGFR  . CBC with Differential/Platelet  . Lipid panel    Caryl Pina, MD [facility] 07/11/2017, 11:42 AM

## 2017-07-12 LAB — CMP14+EGFR
A/G RATIO: 2 (ref 1.2–2.2)
ALT: 12 IU/L (ref 0–44)
AST: 18 IU/L (ref 0–40)
Albumin: 4.5 g/dL (ref 3.5–4.8)
Alkaline Phosphatase: 60 IU/L (ref 39–117)
BUN/Creatinine Ratio: 11 (ref 10–24)
BUN: 12 mg/dL (ref 8–27)
Bilirubin Total: 0.6 mg/dL (ref 0.0–1.2)
CALCIUM: 9.7 mg/dL (ref 8.6–10.2)
CO2: 23 mmol/L (ref 20–29)
CREATININE: 1.07 mg/dL (ref 0.76–1.27)
Chloride: 101 mmol/L (ref 96–106)
GFR, EST AFRICAN AMERICAN: 80 mL/min/{1.73_m2} (ref >59)
GFR, EST NON AFRICAN AMERICAN: 69 mL/min/{1.73_m2} (ref >59)
Globulin, Total: 2.2 g/dL (ref 1.5–4.5)
Glucose: 97 mg/dL (ref 65–99)
POTASSIUM: 4.6 mmol/L (ref 3.5–5.2)
Sodium: 141 mmol/L (ref 134–144)
Total Protein: 6.7 g/dL (ref 6.0–8.5)

## 2017-07-12 LAB — LIPID PANEL
Chol/HDL Ratio: 2.8 ratio (ref 0.0–5.0)
Cholesterol, Total: 123 mg/dL (ref 100–199)
HDL: 44 mg/dL (ref >39)
LDL CALC: 40 mg/dL (ref 0–99)
Triglycerides: 195 mg/dL — ABNORMAL HIGH (ref 0–149)
VLDL CHOLESTEROL CAL: 39 mg/dL (ref 5–40)

## 2017-07-12 LAB — MICROALBUMIN / CREATININE URINE RATIO
CREATININE, UR: 56.3 mg/dL
Microalb/Creat Ratio: <5.3 mg/g creat (ref 0.0–30.0)
Microalbumin, Urine: <3 ug/mL

## 2017-07-12 LAB — CBC WITH DIFFERENTIAL/PLATELET
BASOS: 0 %
Basophils Absolute: 0 10*3/uL (ref 0.0–0.2)
EOS (ABSOLUTE): 0.2 10*3/uL (ref 0.0–0.4)
EOS: 2 %
Hematocrit: 46.5 % (ref 37.5–51.0)
Hemoglobin: 15.9 g/dL (ref 13.0–17.7)
IMMATURE GRANS (ABS): 0 10*3/uL (ref 0.0–0.1)
IMMATURE GRANULOCYTES: 0 %
LYMPHS: 21 %
Lymphocytes Absolute: 1.6 10*3/uL (ref 0.7–3.1)
MCH: 30.4 pg (ref 26.6–33.0)
MCHC: 34.2 g/dL (ref 31.5–35.7)
MCV: 89 fL (ref 79–97)
Monocytes Absolute: 0.4 10*3/uL (ref 0.1–0.9)
Monocytes: 6 %
NEUTROS ABS: 5.2 10*3/uL (ref 1.4–7.0)
NEUTROS PCT: 71 %
Platelets: 200 10*3/uL (ref 150–450)
RBC: 5.23 x10E6/uL (ref 4.14–5.80)
RDW: 13.2 % (ref 12.3–15.4)
WBC: 7.5 10*3/uL (ref 3.4–10.8)

## 2017-08-04 ENCOUNTER — Ambulatory Visit (HOSPITAL_COMMUNITY)
Admission: RE | Admit: 2017-08-04 | Discharge: 2017-08-04 | Disposition: A | Discharge status: Home / Self Care | Payer: Medicare Other | Source: Ambulatory Visit | Attending: Family Medicine | Admitting: Family Medicine

## 2017-08-04 DIAGNOSIS — K5730 Diverticulosis of large intestine without perforation or abscess without bleeding: Secondary | ICD-10-CM | POA: Insufficient documentation

## 2017-08-04 DIAGNOSIS — R6889 Other general symptoms and signs: Secondary | ICD-10-CM

## 2017-08-04 DIAGNOSIS — I700 Atherosclerosis of aorta: Secondary | ICD-10-CM | POA: Diagnosis not present

## 2017-08-04 DIAGNOSIS — K449 Diaphragmatic hernia without obstruction or gangrene: Secondary | ICD-10-CM | POA: Diagnosis not present

## 2017-08-04 DIAGNOSIS — R202 Paresthesia of skin: Secondary | ICD-10-CM | POA: Diagnosis not present

## 2017-08-04 DIAGNOSIS — G629 Polyneuropathy, unspecified: Secondary | ICD-10-CM

## 2017-08-04 MED ORDER — IOPAMIDOL (ISOVUE-370) INJECTION 76%
150.0000 mL | Freq: Once | INTRAVENOUS | Status: AC | PRN
  Administered 2017-08-04: 150 mL via INTRAVENOUS

## 2017-08-15 ENCOUNTER — Telehealth: Payer: Self-pay | Admitting: *Deleted

## 2017-08-15 NOTE — Telephone Encounter (Signed)
Notified of message below

## 2017-08-15 NOTE — Telephone Encounter (Signed)
-----   Message from [name], MD sent at 08/15/2017  8:48 AM EDT ----- Regarding: NO NEED LABS I saw he had labs done 1 month ago so I cancelled his labs tomorrow Please let him know

## 2017-08-16 ENCOUNTER — Telehealth: Payer: Self-pay | Admitting: Hematology and Oncology

## 2017-08-16 ENCOUNTER — Encounter: Payer: Self-pay | Admitting: Hematology and Oncology

## 2017-08-16 ENCOUNTER — Inpatient Hospital Stay: Payer: Medicare Other

## 2017-08-16 ENCOUNTER — Inpatient Hospital Stay: Payer: Medicare Other | Attending: Hematology and Oncology | Admitting: Hematology and Oncology

## 2017-08-16 DIAGNOSIS — Z9221 Personal history of antineoplastic chemotherapy: Secondary | ICD-10-CM

## 2017-08-16 DIAGNOSIS — Z8572 Personal history of non-Hodgkin lymphomas: Secondary | ICD-10-CM | POA: Diagnosis not present

## 2017-08-16 DIAGNOSIS — Z9225 Personal history of immunosupression therapy: Secondary | ICD-10-CM

## 2017-08-16 DIAGNOSIS — R252 Cramp and spasm: Secondary | ICD-10-CM

## 2017-08-16 DIAGNOSIS — Z79899 Other long term (current) drug therapy: Secondary | ICD-10-CM | POA: Diagnosis not present

## 2017-08-16 DIAGNOSIS — I2510 Atherosclerotic heart disease of native coronary artery without angina pectoris: Secondary | ICD-10-CM

## 2017-08-16 NOTE — Assessment & Plan Note (Signed)
Clinically, he has no signs of cancer recurrence I have reviewed his recent CT angiogram which showed no evidence of lymphadenopathy I will see him back in a year with history, physical examination and blood work We discussed the importance of annual influenza vaccination

## 2017-08-16 NOTE — Assessment & Plan Note (Signed)
He complains of mild muscle cramp Recent CT angiogram excluded significant peripheral vascular disease He has appointment to visit with sports medicine for evaluation Will defer to them for further management

## 2017-08-16 NOTE — Progress Notes (Signed)
[name] OFFICE PROGRESS NOTE  Patient Care Team: Dettinger, [name], [name], [name], [name], [name], [name], MD as Consulting Physician (Dermatology)  ASSESSMENT & PLAN:  History of B-cell lymphoma Clinically, he has no signs of cancer recurrence I have reviewed his recent CT angiogram which showed no evidence of lymphadenopathy I will see him back in a year with history, physical examination and blood work We discussed the importance of annual influenza vaccination  Muscle cramp He complains of mild muscle cramp Recent CT angiogram excluded significant peripheral vascular disease He has appointment to visit with sports medicine for evaluation Will defer to them for further management   No orders of the defined types were placed in this encounter.   INTERVAL HISTORY: Please see below for problem oriented charting. 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      12/12/2003 - 06/01/2004 Chemotherapy    He was treated with R. CHOP chemotherapy which show complete remission. The number of cycles of R. CHOP chemotherapy was unknown.      12/19/2006 Surgery    Lung resection show follicular lymphoma.       01/02/2007 - 09/01/2008 Chemotherapy    The patient was treated with single agent rituximab alone.      01/19/2007 Bone Marrow Biopsy    Bone marrow biopsy was negative.      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      05/08/2013 - 05/11/2013 Hospital Admission    The patient was admitted to the hospital for management of pericarditis. CT scan showed extensive lymphadenopathy.      06/07/2013 Imaging    PET/CT scan showed extensive lymphadenopathy      06/25/2013 Procedure    He has placement of Infuse-a-Port.      06/28/2013 Bone Marrow Biopsy    Bone marrow biopsy is positive for lymphoma involvement.      07/04/2013 - 11/22/2013 Chemotherapy    He is treated with 6 cycles of bendamustine with rituximab.      09/24/2013 Imaging    Repeat PET scan show complete remission.      12/26/2013 Imaging    PEt scan showed complete remission      09/26/2014 Imaging    CT scan of the chest abdomen and pelvis show no evidence of disease      03/26/2015 Imaging    CT scan showed no evidence of lymphoma      04/14/2016 Imaging    CT: Borderline prominent right hilar and subcarinal lymph nodes, but not appreciably changed. 2. Low-grade but increased central mesenteric stranding with some small mesenteric lymph nodes. This could certainly be inflammatory, and there is no bulky adenopathy to  suggest a malignant etiology.  3. Coronary and aortoiliac atherosclerotic calcification. 4. Centrilobular and paraseptal emphysema. Postoperative findings in the right lung. 5. 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, [name], volume estimated at 75 cubic cm.      02/15/2017 Imaging     No evidence of recurrent lymphoma or other acute findings.  Stable moderate hiatal hernia.  Colonic diverticulosis, without radiographic evidence of diverticulitis.  Stable mildly enlarged prostate.  Mild emphysema.  Aortic and coronary artery atherosclerosis.       REVIEW OF SYSTEMS:   Constitutional: Denies fevers, chills or abnormal weight loss Eyes: Denies blurriness of vision Ears, nose, mouth, throat, and face: Denies mucositis or sore throat Respiratory: Denies cough, dyspnea or wheezes Cardiovascular: Denies palpitation, chest discomfort or lower extremity swelling Gastrointestinal:  Denies nausea, heartburn or change in bowel habits Skin: Denies abnormal skin rashes Lymphatics: Denies new lymphadenopathy or easy bruising Neurological:Denies numbness, tingling or new weaknesses Behavioral/Psych: Mood is stable, no new changes  All other systems were reviewed with the patient and are negative.  I have reviewed the past medical history, past surgical history, social history and family history with the patient and they are unchanged from previous note.  ALLERGIES:  has No Known Allergies.  MEDICATIONS:  Current Outpatient Medications  Medication Sig Dispense Refill  . diltiazem (CARDIZEM CD) 120 MG 24 hr capsule Take 1 capsule (120 mg total) by mouth every morning. 90 capsule 3  . LUMIGAN 0.01 % SOLN Place 1 drop into both eyes daily.    . metoprolol succinate (TOPROL XL) 25 MG 24 hr tablet Take 1 tablet (25 mg total) by mouth daily. 90 tablet 3  . Multiple Vitamin (MULTIVITAMIN WITH MINERALS) TABS tablet Take 1 tablet by mouth every morning.     Marland Kitchen omeprazole (PRILOSEC) 20 MG capsule Take 1 capsule (20 mg total) by mouth daily. 90 capsule 3  . pravastatin (PRAVACHOL) 20 MG tablet Take 1 tablet (20 mg total) by mouth daily. 90 tablet 0  . Probiotic Product (PROBIOTIC DAILY PO) Take 1 capsule by mouth daily.     . tamsulosin (FLOMAX) 0.4 MG CAPS capsule TAKE 2  CAPSULES DAILY AFTER SUPPER 180 capsule 0   No current facility-administered medications for this visit.     PHYSICAL EXAMINATION: ECOG PERFORMANCE STATUS: 1 - Symptomatic but completely ambulatory  Vitals:   08/16/17 1159  BP: 111/70  Pulse: 72  Resp: 18  Temp: (!) 97.5 F (36.4 C)  SpO2: 97%   Filed Weights   08/16/17 1159  Weight: 188 lb 12.8 oz (85.6 kg)    GENERAL:alert, no distress and comfortable SKIN: skin color, texture, turgor are normal, no rashes or significant lesions EYES: normal, Conjunctiva are pink and non-injected, sclera clear OROPHARYNX:no exudate, no erythema and lips, buccal mucosa, and tongue normal  NECK: supple, thyroid normal size, non-tender, without nodularity LYMPH:  no palpable lymphadenopathy in the cervical, axillary or inguinal LUNGS: clear to auscultation and percussion with normal breathing effort HEART: regular rate & rhythm and no murmurs and no lower extremity edema ABDOMEN:abdomen soft, non-tender and normal bowel sounds Musculoskeletal:no cyanosis of digits and no clubbing  NEURO: alert & oriented x 3 with fluent speech, no focal motor/sensory deficits  LABORATORY DATA:  I have reviewed the data  as listed    Component Value Date/Time   NA 141 07/11/2017 1145   NA 139 04/14/2016 0943   K 4.6 07/11/2017 1145   K 4.6 04/14/2016 0943   CL 101 07/11/2017 1145   CL 104 03/24/2012 1024   CO2 23 07/11/2017 1145   CO2 25 04/14/2016 0943   GLUCOSE 97 07/11/2017 1145   GLUCOSE 98 02/08/2017 0805   GLUCOSE 111 04/14/2016 0943   GLUCOSE 80 03/24/2012 1024   BUN 12 07/11/2017 1145   BUN 8.6 04/14/2016 0943   CREATININE 1.07 07/11/2017 1145   CREATININE 1.1 04/14/2016 0943   CALCIUM 9.7 07/11/2017 1145   CALCIUM 9.4 04/14/2016 0943   PROT 6.7 07/11/2017 1145   PROT 6.5 04/14/2016 0943   ALBUMIN 4.5 07/11/2017 1145   ALBUMIN 3.8 04/14/2016 0943   AST 18 07/11/2017 1145   AST 28 04/14/2016 0943   ALT 12 07/11/2017 1145   ALT 41  04/14/2016 0943   ALKPHOS 60 07/11/2017 1145   ALKPHOS 71 04/14/2016 0943   BILITOT 0.6 07/11/2017 1145   BILITOT 0.61 04/14/2016 0943   GFRNONAA 69 07/11/2017 1145   GFRAA 80 07/11/2017 1145    No results found for: SPEP, UPEP  Lab Results  Component Value Date   WBC 7.5 07/11/2017   NEUTROABS 5.2 07/11/2017   HGB 15.9 07/11/2017   HCT 46.5 07/11/2017   MCV 89 07/11/2017   PLT 200 07/11/2017      Chemistry      Component Value Date/Time   NA 141 07/11/2017 1145   NA 139 04/14/2016 0943   K 4.6 07/11/2017 1145   K 4.6 04/14/2016 0943   CL 101 07/11/2017 1145   CL 104 03/24/2012 1024   CO2 23 07/11/2017 1145   CO2 25 04/14/2016 0943   BUN 12 07/11/2017 1145   BUN 8.6 04/14/2016 0943   CREATININE 1.07 07/11/2017 1145   CREATININE 1.1 04/14/2016 0943      Component Value Date/Time   CALCIUM 9.7 07/11/2017 1145   CALCIUM 9.4 04/14/2016 0943   ALKPHOS 60 07/11/2017 1145   ALKPHOS 71 04/14/2016 0943   AST 18 07/11/2017 1145   AST 28 04/14/2016 0943   ALT 12 07/11/2017 1145   ALT 41 04/14/2016 0943   BILITOT 0.6 07/11/2017 1145   BILITOT 0.61 04/14/2016 0943       RADIOGRAPHIC STUDIES: I have personally reviewed the radiological images as listed and agreed with the findings in the report. Ct Angio Ao+bifem W & Or Wo Contrast  Result Date: 08/04/2017 CLINICAL DATA:  Bilateral leg tingling, foot pain x1 month EXAM: CT ANGIOGRAPHY OF ABDOMINAL AORTA WITH ILIOFEMORAL RUNOFF TECHNIQUE: Multidetector CT imaging of the abdomen, pelvis and lower extremities was performed using the standard protocol during bolus administration of intravenous contrast. Multiplanar CT image reconstructions and MIPs were obtained to evaluate the vascular anatomy. CONTRAST:  156m ISOVUE-370 IOPAMIDOL (ISOVUE-370) INJECTION 76% COMPARISON:  02/15/2017 and previous FINDINGS: VASCULAR Aorta: Mild to moderate partially calcified atheromatous plaque particularly in the infrarenal segment. No aneurysm,  dissection, or stenosis. Celiac: Partially calcified ostial plaque. Patent without evidence of aneurysm, dissection, vasculitis or significant stenosis.Left gastric artery originates directly from the abdominal aorta, an anatomic variant. SMA: Partially calcified ostial plaque. Patent without evidence of aneurysm, dissection, vasculitis or significant stenosis. Renals: Single left, duplicated right. All patent without aneurysm, dissection, or stenosis. IMA: Patent without evidence of aneurysm, dissection, vasculitis or significant stenosis. RIGHT Lower Extremity Inflow: Mild calcified plaque through the  common iliac. Internal and external iliac arteries unremarkable. Outflow: Eccentric nonocclusive plaque in the common femoral artery. Deep femoral branches patent. SFA and popliteal widely patent. Runoff: Patent three vessel runoff to the ankle. LEFT Lower Extremity Inflow: Mild eccentric calcified plaque in the common and internal iliac arteries without stenosis. External iliac widely patent. Outflow: Minimal plaque in the common iliac. Deep femoral and SFA patent. Popliteal widely patent. Runoff: Patent three vessel runoff to the ankle. Veins: No obvious venous abnormality within the limitations of this arterial phase study. Review of the MIP images confirms the above findings. NON-VASCULAR Lower chest: Dependent atelectasis posteriorly in the visualized lung bases. No pleural or pericardial effusion. Moderate hiatal hernia, incompletely visualized. Hepatobiliary: No focal liver abnormality is seen. Status post cholecystectomy. Stable prominent ectatic CBD. Stable hepatic segment 2 cyst. Pancreas: Unremarkable. No pancreatic ductal dilatation or surrounding inflammatory changes. Spleen: Normal in size without focal abnormality. Adrenals/Urinary Tract: Normal adrenals. 3.4 cm inferior left renal cyst. No hydronephrosis. Urinary bladder incompletely distended. Stomach/Bowel: Moderate hiatal hernia, incompletely  visualized proximally. The stomach is nondilated. Small bowel decompressed. Appendix not discretely identified. No pericecal inflammatory/edematous change. Moderate proximal colonic fecal material, decompressed distally. Scattered descending and innumerable sigmoid diverticula, without significant adjacent inflammatory/edematous change or abscess. Lymphatic: No abdominal or pelvic adenopathy. Reproductive: Prostatic enlargement. Other: No ascites.  No free air. Musculoskeletal: Degenerative disc disease L4-5. No fracture or worrisome bone lesion. IMPRESSION: VASCULAR 1. Mild aortoiliac atherosclerosis (ICD10-170.0) without aneurysm or stenosis. 2. No significant lower extremity arterial occlusive disease. NON-VASCULAR 1. No acute findings. 2. Moderate hiatal hernia, incompletely visualized. 3. Descending and sigmoid diverticulosis. Electronically Signed   By: Lucrezia Europe M.D.   On: 08/04/2017 13:17    All questions were answered. The patient knows to call the clinic with any problems, questions or concerns. No barriers to learning was detected.  I spent 10 minutes counseling the patient face to face. The total time spent in the appointment was 15 minutes and more than 50% was on counseling and review of test results  [name], MD 08/16/2017 1:25 PM

## 2017-08-16 NOTE — Telephone Encounter (Signed)
Gave patient avs and calendar.  No referral orders.

## 2017-08-17 ENCOUNTER — Ambulatory Visit (INDEPENDENT_AMBULATORY_CARE_PROVIDER_SITE_OTHER): Payer: Medicare Other | Admitting: Family Medicine

## 2017-08-17 ENCOUNTER — Encounter: Payer: Self-pay | Admitting: Family Medicine

## 2017-08-17 VITALS — BP 122/70 | Ht 72.0 in | Wt 188.0 lb

## 2017-08-17 DIAGNOSIS — I2510 Atherosclerotic heart disease of native coronary artery without angina pectoris: Secondary | ICD-10-CM | POA: Diagnosis not present

## 2017-08-17 DIAGNOSIS — M79604 Pain in right leg: Secondary | ICD-10-CM

## 2017-08-17 DIAGNOSIS — G609 Hereditary and idiopathic neuropathy, unspecified: Secondary | ICD-10-CM | POA: Diagnosis not present

## 2017-08-17 DIAGNOSIS — M79605 Pain in left leg: Secondary | ICD-10-CM

## 2017-08-17 NOTE — Patient Instructions (Addendum)
You have a neuropathy in your lower legs. The most common causes of this are B12 deficiency and diabetes. Arch supports are a consideration but it's doubtful this would help you very much. I would recommend checking your B12 level (we will contact you with the results), consider a medicine for neuropathy like gabapentin or nortriptyline, possibly a neurology referral if the source of neuropathy is not clear.

## 2017-08-17 NOTE — Progress Notes (Signed)
PCP: Dettinger, [name], MD  Subjective:   HPI: Patient is a 72 y.o. male here for bilateral leg pain.  Patient reports for several months now he has had aching and throbbing in bilateral lower legs from the knees down. No acute injury or trauma preceding this. He states there is associated numbness in the same distribution. No similar symptoms in his arms or elsewhere in his body. He does have some chronic back pain which she states is improved with CBD oil. Pain does not seem to improve or worsen depending on any motion of his lower extremities or his back. He states his arch is hurt as well. He is tried some arch supports but these have not helped him either. No skin changes. He does not recall having his B12 checked.  According to note he has either prediabetes or diet-controlled diabetes.  Past Medical History:  Diagnosis Date  . Acute pericarditis    a. 04/2013 -adm with CP, elevated CRP. H/o coronary artery calcification on prior CT but nuc was negative, EF 71%.  . Arthritis   . Atrial fibrillation ([place])    a. Isolated episode in the setting of acute pericarditis 04/2013. Was not placed on anticoag.  Marland Kitchen BPH (benign prostatic hyperplasia)   . Cataract   . Diverticulitis 08/16/2013  . Hyperlipidemia    elevated triglycerides  . Low grade B-cell lymphoma ([place]) 05/11/2011   Initial dx 6/04 left inguinal adenopathy Rx observation; convert to hi grade 11/05 Rx CHOP-R; lesion right lung resected 12/08: low grade NHL; new lesion left submandibular gland 2/13  resected 04/30/11 lo grade NHL  . Malignant lymphoma, high grade ([place]) 03/14/2011  . 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 Pain in joint, pelvic region and thigh 08/01/2013  . PSVT (paroxysmal supraventricular tachycardia) ([place])     Current Outpatient Medications on File Prior to Visit  Medication Sig Dispense Refill  . diltiazem (CARDIZEM CD) 120 MG 24 hr  capsule Take 1 capsule (120 mg total) by mouth every morning. 90 capsule 3  . LUMIGAN 0.01 % SOLN Place 1 drop into both eyes daily.    . metoprolol succinate (TOPROL XL) 25 MG 24 hr tablet Take 1 tablet (25 mg total) by mouth daily. 90 tablet 3  . Multiple Vitamin (MULTIVITAMIN WITH MINERALS) TABS tablet Take 1 tablet by mouth every morning.     Marland Kitchen omeprazole (PRILOSEC) 20 MG capsule Take 1 capsule (20 mg total) by mouth daily. 90 capsule 3  . pravastatin (PRAVACHOL) 20 MG tablet Take 1 tablet (20 mg total) by mouth daily. 90 tablet 0  . Probiotic Product (PROBIOTIC DAILY PO) Take 1 capsule by mouth daily.     . tamsulosin (FLOMAX) 0.4 MG CAPS capsule TAKE 2 CAPSULES DAILY AFTER SUPPER 180 capsule 0   No current facility-administered medications on file prior to visit.     Past Surgical History:  Procedure Laterality Date  . CHOLECYSTECTOMY    . EXPLORATORY LAPAROTOMY    . EYE SURGERY  2016   cataract  . LUNG LOBECTOMY     right side  . LYMPH NODE BIOPSY     in groin with removal  . MENISCUS REPAIR     right knee  . PROSTATE SURGERY    . REFRACTIVE SURGERY Right    piece of metal removed  . SUBMANDIBULAR GLAND EXCISION  04/2011  . SUBMANDIBULAR GLAND EXCISION  04/30/2011  Procedure: EXCISION SUBMANDIBULAR GLAND;  Surgeon: Jerrell Belfast, MD;  Location: [facility];  Service: ENT;  Laterality: Left;  WITH DIAGNOSTIC BIOPSY  . TONSILLECTOMY     as a child  . VEIN LIGATION AND STRIPPING     right leg    No Known Allergies  Social History   Socioeconomic History  . Marital status: Married    Spouse name: Not on file  . Number of children: Not on file  . Years of education: Not on file  . Highest education level: Not on file  Occupational History  . Not on file  Social Needs  . Financial resource strain: Not on file  . Food insecurity:    Worry: Not on file    Inability: Not on file  . Transportation needs:    Medical: Not on file    Non-medical: Not on file  Tobacco Use   . Smoking status: Former Smoker    Packs/day: 1.50    Years: 25.00    Pack years: 37.50    Types: Cigarettes    Start date: 12/12/1962    Last attempt to quit: 01/11/1990    Years since quitting: 27.6  . Smokeless tobacco: Never Used  Substance and Sexual Activity  . Alcohol use: No    Alcohol/week: 0.0 oz  . Drug use: No  . Sexual activity: Not Currently  Lifestyle  . Physical activity:    Days per week: Not on file    Minutes per session: Not on file  . Stress: Not on file  Relationships  . Social connections:    Talks on phone: Not on file    Gets together: Not on file    Attends religious service: Not on file    Active member of club or organization: Not on file    Attends meetings of clubs or organizations: Not on file    Relationship status: Not on file  . Intimate partner violence:    Fear of current or ex partner: Not on file    Emotionally abused: Not on file    Physically abused: Not on file    Forced sexual activity: Not on file  Other Topics Concern  . Not on file  Social History Narrative  . Not on file    Family History  Problem Relation Age of Onset  . Heart disease Father   . Heart attack Father        x 3  . Cancer Sister        breast ca  . Cancer Brother        prostate ca  . Heart attack Sister   . Cancer Sister        breast  . Heart disease Brother   . Heart disease Brother   . Diabetes Brother   . Heart disease Brother   . Heart disease Brother   . Heart disease Brother   . Heart disease Brother   . Heart disease Brother   . Anesthesia problems Neg Hx     BP 122/70   Ht 6' (1.829 m)   Wt 188 lb (85.3 kg)   BMI 25.50 kg/m   Review of Systems: See HPI above.     Objective:  Physical Exam:  Gen: NAD, comfortable in exam room  Back: No gross deformity, scoliosis. Mild paraspinal TTP bilaterally.  No midline or bony TTP. Flexion to 60 degrees, 5 degrees extension.  No pain. Strength LEs 5/5 all muscle groups.   Trace  MSRs in patellar and achilles tendons, equal bilaterally. Negative SLRs. Sensation intact to light touch bilaterally. Negative logroll bilateral hips  Bilateral knees: No gross deformity, ecchymoses, swelling. No TTP. FROM with 5/5 strength. Negative ant/post drawers. Negative valgus/varus testing. Negative lachmanns. Negative mcmurrays, apleys. NV intact distally.  Bilateral ankles: No gross deformity, swelling, ecchymoses FROM with 5/5 strength all directions No TTP Negative ant drawer and talar tilt.   Negative syndesmotic compression. Thompsons test negative. NV intact distally.   Assessment & Plan:  1. Bilateral leg pain - consistent with a peripheral neuropathy.  He has history of prediabetes vs diet-controlled diabetes but would not expect neuropathy from this unless he had DM > 10 years.  Will check B12 level today.  Doubt arch supports would help.  We discussed gabapentin and nortriptyline - will wait on this for now.  Consider neurology referral if B12 is normal for possible NCVs.  Tylenol if needed.

## 2017-08-18 LAB — VITAMIN B12: VITAMIN B 12: 585 pg/mL (ref 232–1245)

## 2017-08-18 NOTE — Addendum Note (Signed)
Addended by: Jolinda Croak E on: 08/18/2017 02:49 PM   Modules accepted: Orders

## 2017-08-22 ENCOUNTER — Encounter: Payer: Self-pay | Admitting: Hematology and Oncology

## 2017-08-22 ENCOUNTER — Other Ambulatory Visit: Payer: Self-pay

## 2017-08-22 MED ORDER — PRAVASTATIN SODIUM 20 MG PO TABS: 20.0000 mg | ORAL_TABLET | Freq: Every day | ORAL | 1 refills | Status: DC

## 2017-08-22 MED ORDER — TAMSULOSIN HCL 0.4 MG PO CAPS: ORAL_CAPSULE | ORAL | 1 refills | Status: DC

## 2017-08-24 ENCOUNTER — Encounter: Payer: Self-pay | Admitting: Family Medicine

## 2017-08-24 ENCOUNTER — Ambulatory Visit (INDEPENDENT_AMBULATORY_CARE_PROVIDER_SITE_OTHER): Payer: Medicare Other | Admitting: Family Medicine

## 2017-08-24 VITALS — BP 100/60 | Ht 72.0 in | Wt 181.0 lb

## 2017-08-24 DIAGNOSIS — I2510 Atherosclerotic heart disease of native coronary artery without angina pectoris: Secondary | ICD-10-CM

## 2017-08-24 DIAGNOSIS — R269 Unspecified abnormalities of gait and mobility: Secondary | ICD-10-CM | POA: Diagnosis not present

## 2017-08-24 DIAGNOSIS — M79672 Pain in left foot: Secondary | ICD-10-CM | POA: Diagnosis not present

## 2017-08-24 DIAGNOSIS — M79671 Pain in right foot: Secondary | ICD-10-CM

## 2017-08-24 NOTE — Progress Notes (Signed)
PCP: Dettinger, [name], MD  Subjective:   HPI: Patient is a 72 y.o. male here for bilateral foot pain.  Patient reports pain over the arches bilaterally as well as over the metatarsal heads bilaterally.  The right is worse than the left.  This has been ongoing for the past 3 to 4 months.  Mildly noticeable increased pain in the morning but largely his pain lasts throughout the day it is somewhat improved with wearing tennis shoes.  He tried wearing insoles that he bought off the Internet which caused only more pain.  He denies any recent injuries.  No erythema, bruising, or swelling.  Denies any numbness or tingling or skin changes..  Past Medical History:  Diagnosis Date  . Acute pericarditis    a. 04/2013 -adm with CP, elevated CRP. H/o coronary artery calcification on prior CT but nuc was negative, EF 71%.  . Arthritis   . Atrial fibrillation ([place])    a. Isolated episode in the setting of acute pericarditis 04/2013. Was not placed on anticoag.  Marland Kitchen BPH (benign prostatic hyperplasia)   . Cataract   . Diverticulitis 08/16/2013  . Hyperlipidemia    elevated triglycerides  . 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 Pain in joint, pelvic region and thigh 08/01/2013  . PSVT (paroxysmal supraventricular tachycardia) ([place])     Current Outpatient Medications on File Prior to Visit  Medication Sig Dispense Refill  . diltiazem (CARDIZEM CD) 120 MG 24 hr capsule Take 1 capsule (120 mg total) by mouth every morning. 90 capsule 3  . LUMIGAN 0.01 % SOLN Place 1 drop into both eyes daily.    . metoprolol succinate (TOPROL XL) 25  MG 24 hr tablet Take 1 tablet (25 mg total) by mouth daily. 90 tablet 3  . Multiple Vitamin (MULTIVITAMIN WITH MINERALS) TABS tablet Take 1 tablet by mouth every morning.     Marland Kitchen omeprazole (PRILOSEC) 20 MG capsule Take 1 capsule (20 mg total) by mouth daily. 90 capsule 3  . pravastatin (PRAVACHOL) 20 MG tablet Take 1 tablet (20 mg total) by mouth daily. 90 tablet 1  . Probiotic Product (PROBIOTIC DAILY PO) Take 1 capsule by mouth daily.     . tamsulosin (FLOMAX) 0.4 MG CAPS capsule TAKE 2 CAPSULES DAILY AFTER SUPPER 180 capsule 1   No current facility-administered medications on file prior to visit.     Past Surgical History:  Procedure Laterality Date  . CHOLECYSTECTOMY    . EXPLORATORY LAPAROTOMY    . EYE SURGERY  2016   cataract  . LUNG LOBECTOMY     right side  . LYMPH NODE BIOPSY     in groin with removal  . MENISCUS REPAIR     right knee  . PROSTATE SURGERY    . REFRACTIVE SURGERY Right    piece of metal removed  . SUBMANDIBULAR GLAND EXCISION  04/2011  . SUBMANDIBULAR GLAND EXCISION  04/30/2011   Procedure: EXCISION SUBMANDIBULAR GLAND;  Surgeon: Jerrell Belfast, MD;  Location: [facility];  Service: ENT;  Laterality: Left;  WITH DIAGNOSTIC BIOPSY  . TONSILLECTOMY  as a child  . VEIN LIGATION AND STRIPPING     right leg    No Known Allergies  Social History   Socioeconomic History  . Marital status: Married    Spouse name: Not on file  . Number of children: Not on file  . Years of education: Not on file  . Highest education level: Not on file  Occupational History  . Not on file  Social Needs  . Financial resource strain: Not on file  . Food insecurity:    Worry: Not on file    Inability: Not on file  . Transportation needs:    Medical: Not on file    Non-medical: Not on file  Tobacco Use  . Smoking status: Former Smoker    Packs/day: 1.50    Years: 25.00    Pack years: 37.50    Types: Cigarettes    Start date: 12/12/1962    Last attempt to quit: 01/11/1990     Years since quitting: 27.6  . Smokeless tobacco: Never Used  Substance and Sexual Activity  . Alcohol use: No    Alcohol/week: 0.0 standard drinks  . Drug use: No  . Sexual activity: Not Currently  Lifestyle  . Physical activity:    Days per week: Not on file    Minutes per session: Not on file  . Stress: Not on file  Relationships  . Social connections:    Talks on phone: Not on file    Gets together: Not on file    Attends religious service: Not on file    Active member of club or organization: Not on file    Attends meetings of clubs or organizations: Not on file    Relationship status: Not on file  . Intimate partner violence:    Fear of current or ex partner: Not on file    Emotionally abused: Not on file    Physically abused: Not on file    Forced sexual activity: Not on file  Other Topics Concern  . Not on file  Social History Narrative  . Not on file    Family History  Problem Relation Age of Onset  . Heart disease Father   . Heart attack Father        x 3  . Cancer Sister        breast ca  . Cancer Brother        prostate ca  . Heart attack Sister   . Cancer Sister        breast  . Heart disease Brother   . Heart disease Brother   . Diabetes Brother   . Heart disease Brother   . Heart disease Brother   . Heart disease Brother   . Heart disease Brother   . Heart disease Brother   . Anesthesia problems Neg Hx     BP 100/60   Ht 6' (1.829 m)   Wt 181 lb (82.1 kg)   BMI 24.55 kg/m   Review of Systems: See HPI above.     Objective:  Physical Exam:  Gen: awake, alert, NAD, comfortable in exam room Pulm: breathing unlabored  Bilateral foot: Inspection:  No obvious bony deformity.  No swelling, erythema, or bruising.  Mild pes planus bilaterally  palpation: Tenderness to palpation over medial calcaneal tubercle and along the arch of both feet.  No reproduced with tenderness with palpation of the distal metatarsal heads ROM: Full ROM of the  ankle.  Strength: 5/5 strength ankle  in all planes Neurovascular: N/V intact distally in the lower extremity Special tests: Negative squeeze. normal midfoot flexibility.    Assessment & Plan:  1.  Patient has bilateral foot pain.  There seems to be possible overlap of metatarsalgia and mild plantar fasciitis.  Additionally he has pes planus and resulting pronation during his gait.  Patient was fitted and provided with custom orthotics today.  Patient was fitted for a : standard, cushioned, semi-rigid orthotic. The orthotic was heated and afterward the patient stood on the orthotic blank positioned on the orthotic stand. The patient was positioned in subtalar neutral position and 10 degrees of ankle dorsiflexion in a weight bearing stance. After completion of molding, a stable base was applied to the orthotic blank. The blank was ground to a stable position for weight bearing. Size: 12 Base: Blue EVA Posting: none  Additional orthotic padding: none  Total time spent with the patient was 30 minutes with greater than 50% of the time spent in face-to-face consultation discussing orthotic construction, instruction, and sitting. Gait was neutral with orthotics in place. Patient found them to be comfortable. Follow-up as needed.

## 2017-08-25 ENCOUNTER — Encounter: Payer: Self-pay | Admitting: Family Medicine

## 2017-09-01 ENCOUNTER — Encounter: Payer: Self-pay | Admitting: Hematology and Oncology

## 2017-09-28 DIAGNOSIS — T1511XA Foreign body in conjunctival sac, right eye, initial encounter: Secondary | ICD-10-CM | POA: Diagnosis not present

## 2017-10-01 DIAGNOSIS — Z23 Encounter for immunization: Secondary | ICD-10-CM | POA: Diagnosis not present

## 2017-10-03 DIAGNOSIS — Z961 Presence of intraocular lens: Secondary | ICD-10-CM | POA: Diagnosis not present

## 2017-10-03 DIAGNOSIS — H1789 Other corneal scars and opacities: Secondary | ICD-10-CM | POA: Diagnosis not present

## 2017-10-03 DIAGNOSIS — H353131 Nonexudative age-related macular degeneration, bilateral, early dry stage: Secondary | ICD-10-CM | POA: Diagnosis not present

## 2017-10-03 DIAGNOSIS — H2512 Age-related nuclear cataract, left eye: Secondary | ICD-10-CM | POA: Diagnosis not present

## 2017-10-03 DIAGNOSIS — H5210 Myopia, unspecified eye: Secondary | ICD-10-CM | POA: Diagnosis not present

## 2017-10-03 DIAGNOSIS — H401131 Primary open-angle glaucoma, bilateral, mild stage: Secondary | ICD-10-CM | POA: Diagnosis not present

## 2017-11-25 DIAGNOSIS — S0501XA Injury of conjunctiva and corneal abrasion without foreign body, right eye, initial encounter: Secondary | ICD-10-CM | POA: Diagnosis not present

## 2017-12-29 DIAGNOSIS — H401131 Primary open-angle glaucoma, bilateral, mild stage: Secondary | ICD-10-CM | POA: Diagnosis not present

## 2017-12-29 DIAGNOSIS — H1789 Other corneal scars and opacities: Secondary | ICD-10-CM | POA: Diagnosis not present

## 2017-12-29 DIAGNOSIS — H2512 Age-related nuclear cataract, left eye: Secondary | ICD-10-CM | POA: Diagnosis not present

## 2017-12-29 DIAGNOSIS — Z961 Presence of intraocular lens: Secondary | ICD-10-CM | POA: Diagnosis not present

## 2018-01-09 ENCOUNTER — Other Ambulatory Visit: Payer: Self-pay

## 2018-01-09 MED ORDER — METOPROLOL SUCCINATE ER 25 MG PO TB24: 25.0000 mg | ORAL_TABLET | Freq: Every day | ORAL | 0 refills | Status: DC

## 2018-01-09 NOTE — Telephone Encounter (Signed)
Last seen 07/11/17

## 2018-01-13 ENCOUNTER — Other Ambulatory Visit: Payer: Medicare Other

## 2018-01-13 DIAGNOSIS — E119 Type 2 diabetes mellitus without complications: Secondary | ICD-10-CM

## 2018-01-13 DIAGNOSIS — E785 Hyperlipidemia, unspecified: Secondary | ICD-10-CM

## 2018-01-13 DIAGNOSIS — N400 Enlarged prostate without lower urinary tract symptoms: Secondary | ICD-10-CM

## 2018-01-13 DIAGNOSIS — R7303 Prediabetes: Secondary | ICD-10-CM

## 2018-01-13 LAB — BAYER DCA HB A1C WAIVED: HB A1C: 5.5 % (ref <7.0)

## 2018-01-14 LAB — CMP14+EGFR
A/G RATIO: 2.4 — AB (ref 1.2–2.2)
ALBUMIN: 4.5 g/dL (ref 3.5–4.8)
ALT: 16 IU/L (ref 0–44)
AST: 17 IU/L (ref 0–40)
Alkaline Phosphatase: 69 IU/L (ref 39–117)
BILIRUBIN TOTAL: 0.5 mg/dL (ref 0.0–1.2)
BUN / CREAT RATIO: 16 (ref 10–24)
BUN: 18 mg/dL (ref 8–27)
CHLORIDE: 105 mmol/L (ref 96–106)
CO2: 24 mmol/L (ref 20–29)
Calcium: 9.3 mg/dL (ref 8.6–10.2)
Creatinine, Ser: 1.11 mg/dL (ref 0.76–1.27)
GFR calc non Af Amer: 66 mL/min/{1.73_m2} (ref >59)
GFR, EST AFRICAN AMERICAN: 76 mL/min/{1.73_m2} (ref >59)
Globulin, Total: 1.9 g/dL (ref 1.5–4.5)
Glucose: 83 mg/dL (ref 65–99)
POTASSIUM: 4.9 mmol/L (ref 3.5–5.2)
SODIUM: 142 mmol/L (ref 134–144)
TOTAL PROTEIN: 6.4 g/dL (ref 6.0–8.5)

## 2018-01-14 LAB — LIPID PANEL
Chol/HDL Ratio: 2.5 ratio (ref 0.0–5.0)
Cholesterol, Total: 113 mg/dL (ref 100–199)
HDL: 45 mg/dL (ref >39)
LDL Calculated: 46 mg/dL (ref 0–99)
Triglycerides: 108 mg/dL (ref 0–149)
VLDL Cholesterol Cal: 22 mg/dL (ref 5–40)

## 2018-01-14 LAB — CBC WITH DIFFERENTIAL/PLATELET
Basophils Absolute: 0 10*3/uL (ref 0.0–0.2)
Basos: 0 %
EOS (ABSOLUTE): 0.2 10*3/uL (ref 0.0–0.4)
Eos: 3 %
HEMOGLOBIN: 15.6 g/dL (ref 13.0–17.7)
Hematocrit: 46.7 % (ref 37.5–51.0)
IMMATURE GRANS (ABS): 0 10*3/uL (ref 0.0–0.1)
Immature Granulocytes: 0 %
LYMPHS: 19 %
Lymphocytes Absolute: 1.6 10*3/uL (ref 0.7–3.1)
MCH: 29.4 pg (ref 26.6–33.0)
MCHC: 33.4 g/dL (ref 31.5–35.7)
MCV: 88 fL (ref 79–97)
Monocytes Absolute: 0.8 10*3/uL (ref 0.1–0.9)
Monocytes: 10 %
Neutrophils Absolute: 5.6 10*3/uL (ref 1.4–7.0)
Neutrophils: 68 %
PLATELETS: 195 10*3/uL (ref 150–450)
RBC: 5.3 x10E6/uL (ref 4.14–5.80)
RDW: 13 % (ref 12.3–15.4)
WBC: 8.2 10*3/uL (ref 3.4–10.8)

## 2018-01-17 ENCOUNTER — Ambulatory Visit (INDEPENDENT_AMBULATORY_CARE_PROVIDER_SITE_OTHER): Payer: Medicare Other | Admitting: Family Medicine

## 2018-01-17 ENCOUNTER — Encounter: Payer: Self-pay | Admitting: Family Medicine

## 2018-01-17 VITALS — BP 115/75 | HR 63 | Temp 97.2°F | Ht 73.0 in | Wt 187.0 lb

## 2018-01-17 DIAGNOSIS — R7303 Prediabetes: Secondary | ICD-10-CM

## 2018-01-17 DIAGNOSIS — K219 Gastro-esophageal reflux disease without esophagitis: Secondary | ICD-10-CM

## 2018-01-17 DIAGNOSIS — E782 Mixed hyperlipidemia: Secondary | ICD-10-CM | POA: Diagnosis not present

## 2018-01-17 NOTE — Progress Notes (Signed)
BP 115/75   Pulse 63   Temp (!) 97.2 F (36.2 C) (Oral)   Ht 6\' 1"  (1.854 m)   Wt 187 lb (84.8 kg)   BMI 24.67 kg/m    Subjective:    Patient ID: Anthony Kelly, male    DOB: 1945-01-22, 73 y.o.   MRN: [id number]  HPI: Anthony Kelly is a 73 y.o. male presenting on 01/17/2018 for Hypertension (6 month follow up) and Gastroesophageal Reflux   HPI Prediabetes Patient comes in today for recheck of his diabetes. Patient has been currently taking no medication has been diet controlled and his last A1c was 5.5-4 days ago.. Patient is not currently on an ACE inhibitor/ARB. Patient has not seen an ophthalmologist this year. Patient denies any issues with their feet.   Hyperlipidemia Patient is coming in for recheck of his hyperlipidemia. The patient is currently taking pravastatin. They deny any issues with myalgias or history of liver damage from it. They deny any focal numbness or weakness or chest pain.   GERD Patient is currently on omeprazole.  She denies any major symptoms or abdominal pain or belching or burping. She denies any blood in her stool or lightheadedness or dizziness.   Relevant past medical, surgical, family and social history reviewed and updated as indicated. Interim medical history since our last visit reviewed. Allergies and medications reviewed and updated.  Review of Systems  Constitutional: Negative for chills and fever.  Eyes: Negative for visual disturbance.  Respiratory: Negative for shortness of breath and wheezing.   Cardiovascular: Negative for chest pain and leg swelling.  Musculoskeletal: Negative for back pain and gait problem.  Skin: Negative for rash.  Neurological: Negative for dizziness, weakness, light-headedness and numbness.  All other systems reviewed and are negative.   Per HPI unless specifically indicated above   Allergies as of 01/17/2018   No Known Allergies     Medication List       Accurate as of January 17, 2018  2:15 PM. Always  use your most recent med list.        diltiazem 120 MG 24 hr capsule Commonly known as:  CARDIZEM CD Take 1 capsule (120 mg total) by mouth every morning.   LUMIGAN 0.01 % Soln Generic drug:  bimatoprost Place 1 drop into both eyes daily.   metoprolol succinate 25 MG 24 hr tablet Commonly known as:  TOPROL XL Take 1 tablet (25 mg total) by mouth daily.   multivitamin with minerals Tabs tablet Take 1 tablet by mouth every morning.   omeprazole 20 MG capsule Commonly known as:  PRILOSEC Take 1 capsule (20 mg total) by mouth daily.   pravastatin 20 MG tablet Commonly known as:  PRAVACHOL Take 1 tablet (20 mg total) by mouth daily.   PROBIOTIC DAILY PO Take 1 capsule by mouth daily.   tamsulosin 0.4 MG Caps capsule Commonly known as:  FLOMAX TAKE 2 CAPSULES DAILY AFTER SUPPER          Objective:    BP 115/75   Pulse 63   Temp (!) 97.2 F (36.2 C) (Oral)   Ht 6\' 1"  (1.854 m)   Wt 187 lb (84.8 kg)   BMI 24.67 kg/m   Wt Readings from Last 3 Encounters:  01/17/18 187 lb (84.8 kg)  08/24/17 181 lb (82.1 kg)  08/17/17 188 lb (85.3 kg)    Physical Exam Vitals signs and nursing note reviewed.  Constitutional:      General: He is not in  acute distress.    Appearance: He is well-developed. He is not diaphoretic.  Eyes:     General: No scleral icterus.    Conjunctiva/sclera: Conjunctivae normal.  Neck:     Musculoskeletal: Neck supple.     Thyroid: No thyromegaly.  Cardiovascular:     Rate and Rhythm: Normal rate and regular rhythm.     Heart sounds: Normal heart sounds. No murmur.  Pulmonary:     Effort: Pulmonary effort is normal. No respiratory distress.     Breath sounds: Normal breath sounds. No wheezing.  Musculoskeletal: Normal range of motion.  Lymphadenopathy:     Cervical: No cervical adenopathy.  Skin:    General: Skin is warm and dry.     Findings: No rash.  Neurological:     Mental Status: He is alert and oriented to person, place, and time.       Coordination: Coordination normal.  Psychiatric:        Behavior: Behavior normal.       Assessment & Plan:   Problem List Items Addressed This Visit      Digestive   GERD (gastroesophageal reflux disease)     Other   Hyperlipidemia   Prediabetes - Primary      Continue omeprazole and pravastatin Follow up plan: Return in about 6 months (around 07/18/2018), or if symptoms worsen or fail to improve, for Prediabetes and cholesterol and GERD.  Counseling provided for all of the vaccine components No orders of the defined types were placed in this encounter.   Caryl Pina, MD [facility] 01/17/2018, 2:15 PM

## 2018-02-17 ENCOUNTER — Ambulatory Visit (INDEPENDENT_AMBULATORY_CARE_PROVIDER_SITE_OTHER): Payer: Medicare Other | Admitting: *Deleted

## 2018-02-17 ENCOUNTER — Encounter: Payer: Self-pay | Admitting: *Deleted

## 2018-02-17 VITALS — BP 105/70 | HR 75 | Ht 73.0 in | Wt 184.0 lb

## 2018-02-17 DIAGNOSIS — Z0000 Encounter for general adult medical examination without abnormal findings: Secondary | ICD-10-CM

## 2018-02-17 DIAGNOSIS — Z23 Encounter for immunization: Secondary | ICD-10-CM

## 2018-02-17 NOTE — Progress Notes (Signed)
Subjective:   Anthony Kelly is a 73 y.o. male who presents for a subsequent Medicare Annual Wellness Visit.  Anthony Kelly is accompanied today by his wife Anthony Kelly.  He is retired from [employer] and then worked 12 years at [employer].  He enjoys gardening and taking care of his [place] and [employer].  He and his wife lived in [place], [place] while he was in the [employer].  They moved back to [place] after he retired in 1996.  He has 2 biological children, 2 step children, 6 grandchildren and 6 great grandchildren.    Patient Care Team: Dettinger, [name], [name], [name], [name], [name], [name], MD as Consulting Physician (Dermatology)  Hospitalizations, surgeries, and ER visits in previous 12 months No hospitalizations, ER visits, or surgeries this past year.   Review of Systems    Patient reports that his overall health is unchanged compared to last year.  Cardiac Risk Factors include: male gender;dyslipidemia;hypertension   All other systems negative       Current Medications (verified) Outpatient Encounter Medications as of 02/17/2018  Medication Sig  . diltiazem (CARDIZEM CD) 120 MG 24 hr capsule Take 1 capsule (120 mg total) by mouth every morning.  Marland Kitchen LUMIGAN 0.01 % SOLN Place 1 drop into both eyes daily.  . metoprolol succinate (TOPROL XL) 25 MG 24 hr tablet Take 1 tablet (25 mg total) by mouth daily.  . Multiple Vitamin (MULTIVITAMIN WITH MINERALS) TABS tablet Take 1 tablet by mouth every morning.   Marland Kitchen omeprazole (PRILOSEC) 20 MG capsule Take 1 capsule (20 mg total) by mouth daily.  . pravastatin (PRAVACHOL) 20 MG tablet Take 1 tablet (20 mg total) by mouth daily.  . Probiotic Product (PROBIOTIC  DAILY PO) Take 1 capsule by mouth daily.   . tamsulosin (FLOMAX) 0.4 MG CAPS capsule TAKE 2 CAPSULES DAILY AFTER SUPPER  . [DISCONTINUED] FLUAD 0.5 ML SUSY Inject as directed See admin instructions.   No facility-administered encounter medications on file as of 02/17/2018.     Allergies (verified) Patient has no known allergies.   History: Past Medical History:  Diagnosis Date  . Acute pericarditis    a. 04/2013 -adm with CP, elevated CRP. H/o coronary artery calcification on prior CT but nuc was negative, EF 71%.  . Arthritis   . Atrial fibrillation ([place])    a. Isolated episode in the setting of acute pericarditis 04/2013. Was not placed on anticoag.  Marland Kitchen BPH (benign prostatic hyperplasia)   . Cataract   . Diverticulitis 08/16/2013  . Hyperlipidemia    elevated triglycerides  . 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 Pain in joint, pelvic region and thigh 08/01/2013  . PSVT (paroxysmal supraventricular tachycardia) ([place])    Past Surgical History:  Procedure Laterality Date  . CHOLECYSTECTOMY    . EXPLORATORY LAPAROTOMY    . EYE SURGERY  2016  cataract  . LUNG LOBECTOMY     right side  . LYMPH NODE BIOPSY     in groin with removal  . MENISCUS REPAIR     right knee  . PROSTATE SURGERY    . REFRACTIVE SURGERY Right    piece of metal removed  . SUBMANDIBULAR GLAND EXCISION  04/2011  . SUBMANDIBULAR GLAND EXCISION  04/30/2011   Procedure: EXCISION SUBMANDIBULAR GLAND;  Surgeon: Jerrell Belfast, MD;  Location: [facility];  Service: ENT;  Laterality: Left;  WITH DIAGNOSTIC BIOPSY  . TONSILLECTOMY     as a child    . VEIN LIGATION AND STRIPPING     right leg   Family History  Problem Relation Age of Onset  . Heart disease Father   . Heart attack Father        x 3  . Cancer Sister        breast ca  . Cancer Brother        prostate ca  . Heart attack Sister   . Cancer Sister        breast  . Heart disease Brother   . Alzheimer's disease Sister   . Diabetes Sister   . Heart disease Brother   . Diabetes Brother   . Heart disease Brother   . Heart disease Brother   . Heart disease Brother   . Heart disease Brother   . Heart disease Brother   . Alzheimer's disease Brother   . Diabetes Son   . Anesthesia problems Neg Hx    Social History   Socioeconomic History  . Marital status: Married    Spouse name: Not on file  . Number of children: 2  . Years of education: Not on file  . Highest education level: Some college, no degree  Occupational History  . Occupation: Retired     Comment: Secretary/administrator  . Occupation: Retired     Comment: Barista  . Financial resource strain: Not hard at all  . Food insecurity:    Worry: Never true    Inability: Never true  . Transportation needs:    Medical: No    Non-medical: No  Tobacco Use  . Smoking status: Former Smoker    Packs/day: 1.50    Years: 25.00    Pack years: 37.50    Types: Cigarettes    Start date: 12/12/1962    Last attempt to quit: 01/11/1990    Years since quitting: 28.1  . Smokeless tobacco: Never Used  Substance and Sexual Activity  . Alcohol use: No    Alcohol/week: 0.0 standard drinks  . Drug use: No  . Sexual activity: Not on file  Lifestyle  . Physical activity:    Days per week: 0 days    Minutes per session: 0 min  . Stress: Not at all  Relationships  . Social connections:    Talks on phone: More than three times a week    Gets together: More than three times a week    Attends religious service: Never    Active member of club or organization: No    Attends meetings of clubs or  organizations: Never    Relationship status: Married  Other Topics Concern  . Not on file  Social History Narrative  . Not on file     Clinical Intake:     Pain Score: 0-No pain  Activities of Daily Living In your present state of health, do you have any difficulty performing the following activities: 02/17/2018  Hearing? N  Vision? N  Difficulty concentrating or making decisions? Y  Comment Trouble with short term memory   Walking or climbing stairs? N  Dressing or bathing? N  Doing errands, shopping? N  Preparing Food and eating ? N  Using the Toilet? N  In the past six months, have you accidently leaked urine? N  Do you have problems with loss of bowel control? N  Managing your Medications? N  Managing your Finances? N  Housekeeping or managing your Housekeeping? N  Some recent data might be hidden     Exercise Current Exercise Habits: The patient does not participate in regular exercise at present, Exercise limited by: orthopedic condition(s)  Diet Consumes 3 meals a day and 1 snacks a day.  The patient feels that they mostly follow a Regular diet.  Diet History no problem areas noted   Depression Screen PHQ 2/9 Scores 02/17/2018 01/17/2018 07/11/2017 06/07/2017 01/14/2017 10/14/2016 09/03/2016  PHQ - 2 Score 0 0 0 0 0 0 0     Fall Risk Fall Risk  02/17/2018 01/17/2018 07/11/2017 06/07/2017 01/14/2017  Falls in the past year? 0 0 No No No     Objective:    Today's Vitals   02/17/18 1011  BP: 105/70  Pulse: 75  Weight: 184 lb (83.5 kg)  Height: 6\' 1"  (1.854 m)  PainSc: 0-No pain   Body mass index is 24.28 kg/m.  Advanced Directives 02/17/2018 10/16/2015 03/27/2015 02/06/2015 09/27/2014 06/28/2014 01/18/2014  Does Patient Have a Medical Advance Directive? Yes Yes Yes Yes Yes Yes Yes  Type of Paramedic of [place] AR[place];Living will [place];Living will [place];Living will [place];Living will [place];Living will [place];Living will [place];Living will  Does patient want to make changes to medical advance directive? No - Patient declined No - Patient declined No - Patient declined No - Patient declined No - Patient declined No - Patient declined -  Copy of [place] in Chart? No - copy requested Yes Yes Yes No - copy requested Yes Yes  Would patient like information on creating a medical advance directive? - - - - - - -  Pre-existing out of facility DNR order (yellow form or pink MOST form) - - - - - - -    Hearing/Vision  No hearing or vision deficits noted during visit.  Cognitive Function: MMSE - Mini Mental State Exam 02/17/2018 02/06/2015  Orientation to time 5 5  Orientation to Place 5 5  Registration 3 3  Attention/ Calculation 5 5  Recall 2 3  Language- name 2 objects 2 2  Language- repeat 1 1  Language- follow 3 step command 3 3  Language- read & follow direction 1 1  Write a sentence 1 1  Copy design 1 1  Total score 29 30         Immunizations and Health Maintenance Immunization History  Administered Date(s) Administered  . Influenza, High Dose Seasonal PF 10/12/2016, 10/01/2017  . Influenza,inj,Quad PF,6+ Mos 09/27/2014, 10/16/2015  . Influenza-Unspecified 09/11/2012, 11/11/2013, 10/12/2016  . Pneumococcal Conjugate-13 03/07/2013  . Pneumococcal Polysaccharide-23 09/25/2012  . Tdap 08/11/2010  . Zoster Recombinat (Shingrix) 02/17/2018   Health Maintenance Due  Topic Date Due  . Hepatitis C Screening  04-13-1945  . OPHTHALMOLOGY EXAM  12/12/1954   Patient is seen at [facility] in [place], [place].  Confirmed with their office that patient was seen for an exam seen in September 2019.  Requested records to be faxed from this visit.  Recommend Hepatitis C screening at next visit with Dr. Warrick Parisian.  Health Maintenance  Topic Date  Due  . Hepatitis C Screening  05/25/1945  . OPHTHALMOLOGY EXAM  12/12/1954  . INFLUENZA VACCINE  04/11/2018 (Originally 08/11/2017)  . FOOT EXAM  07/12/2018  . URINE MICROALBUMIN  07/12/2018  . HEMOGLOBIN A1C  07/14/2018  . TETANUS/TDAP  08/10/2020  . COLONOSCOPY  02/23/2024  . PNA vac Low Risk Adult  Completed        Assessment:   This is a routine wellness examination for [facility].    Plan:    Goals    . DIET - EAT MORE FRUITS AND VEGETABLES     Try to include 3-5 servings of fruits and vegetables per day.        Health Maintenance & Additional Screening Recommendations: Advanced directives: has an advanced directive - a copy HAS NOT been provided.  Lung: Low Dose CT Chest recommended if Age 87-80 years, 30 pack-year currently smoking OR have quit w/in 15years. Patient does not qualify. Hepatitis C Screening recommended: yes  Today's Orders Orders Placed This Encounter  Procedures  . Varicella-zoster vaccine IM (Shingrix)    Keep f/u with Dettinger, [name], MD and any other specialty appointments you may have Continue current medications Move carefully to avoid falls.  Aim for at least 150 minutes of moderate activity a week.  Read or work on puzzles daily Stay connected with friends and family  I have personally reviewed and noted the following in the patient's chart:   . Medical and social history . Use of alcohol, tobacco or illicit drugs  . Current medications and supplements . Functional ability and status . Nutritional status . Physical activity . Advanced directives . List of other physicians . Hospitalizations, surgeries, and ER visits in previous 12 months . Vitals . Screenings to include cognitive, depression, and falls . Referrals and appointments  In addition, I have reviewed and discussed with patient certain preventive protocols, quality metrics, and best practice recommendations. A written personalized care plan for preventive services as  well as general preventive health recommendations were provided to patient.     Nolberto Hanlon, RN  02/17/2018

## 2018-02-17 NOTE — Patient Instructions (Addendum)
Goals    . DIET - EAT MORE FRUITS AND VEGETABLES     Try to include 3-5 servings of fruits and vegetables per day.   At your convenience, please bring a copy of your Advance Directives (Healthcare Power of Attorney and Living Will) to our office to be filed in your medical record.  Please follow up with Dr. Dettinger as scheduled.   You will need your 2nd Shingrix (Shingles) vaccine after 04/18/2018.   Thank you for coming in for your Annual Wellness Visit today!!    Preventive Care 65 Years and Older, Male Preventive care refers to lifestyle choices and visits with your health care provider that can promote health and wellness. What does preventive care include?   A yearly physical exam. This is also called an annual well check.  Dental exams once or twice a year.  Routine eye exams. Ask your health care provider how often you should have your eyes checked.  Personal lifestyle choices, including: ? Daily care of your teeth and gums. ? Regular physical activity. ? Eating a healthy diet. ? Avoiding tobacco and drug use. ? Limiting alcohol use. ? Practicing safe sex. ? Taking low doses of aspirin every day. ? Taking vitamin and mineral supplements as recommended by your health care provider. What happens during an annual well check? The services and screenings done by your health care provider during your annual well check will depend on your age, overall health, lifestyle risk factors, and family history of disease. Counseling Your health care provider may ask you questions about your:  Alcohol use.  Tobacco use.  Drug use.  Emotional well-being.  Home and relationship well-being.  Sexual activity.  Eating habits.  History of falls.  Memory and ability to understand (cognition).  Work and work environment. Screening You may have the following tests or measurements:  Height, weight, and BMI.  Blood pressure.  Lipid and cholesterol levels. These may be  checked every 5 years, or more frequently if you are over 50 years old.  Skin check.  Lung cancer screening. You may have this screening every year starting at age 55 if you have a 30-pack-year history of smoking and currently smoke or have quit within the past 15 years.  Colorectal cancer screening. All adults should have this screening starting at age 50 and continuing until age 75. You will have tests every 1-10 years, depending on your results and the type of screening test. People at increased risk should start screening at an earlier age. Screening tests may include: ? Guaiac-based fecal occult blood testing. ? Fecal immunochemical test (FIT). ? Stool DNA test. ? Virtual colonoscopy. ? Sigmoidoscopy. During this test, a flexible tube with a tiny camera (sigmoidoscope) is used to examine your rectum and lower colon. The sigmoidoscope is inserted through your anus into your rectum and lower colon. ? Colonoscopy. During this test, a long, thin, flexible tube with a tiny camera (colonoscope) is used to examine your entire colon and rectum.  Prostate cancer screening. Recommendations will vary depending on your family history and other risks.  Hepatitis C blood test.  Hepatitis B blood test.  Sexually transmitted disease (STD) testing.  Diabetes screening. This is done by checking your blood sugar (glucose) after you have not eaten for a while (fasting). You may have this done every 1-3 years.  Abdominal aortic aneurysm (AAA) screening. You may need this if you are a current or former smoker.  Osteoporosis. You may be screened starting at age   70 if you are at high risk. Talk with your health care provider about your test results, treatment options, and if necessary, the need for more tests. Vaccines Your health care provider may recommend certain vaccines, such as:  Influenza vaccine. This is recommended every year.  Tetanus, diphtheria, and acellular pertussis (Tdap, Td) vaccine.  You may need a Td booster every 10 years.  Varicella vaccine. You may need this if you have not been vaccinated.  Zoster vaccine. You may need this after age 60.  Measles, mumps, and rubella (MMR) vaccine. You may need at least one dose of MMR if you were born in 1957 or later. You may also need a second dose.  Pneumococcal 13-valent conjugate (PCV13) vaccine. One dose is recommended after age 74.  Pneumococcal polysaccharide (PPSV23) vaccine. One dose is recommended after age 74.  Meningococcal vaccine. You may need this if you have certain conditions.  Hepatitis A vaccine. You may need this if you have certain conditions or if you travel or work in places where you may be exposed to hepatitis A.  Hepatitis B vaccine. You may need this if you have certain conditions or if you travel or work in places where you may be exposed to hepatitis B.  Haemophilus influenzae type b (Hib) vaccine. You may need this if you have certain risk factors. Talk to your health care provider about which screenings and vaccines you need and how often you need them. This information is not intended to replace advice given to you by your health care provider. Make sure you discuss any questions you have with your health care provider. Document Released: 01/24/2015 Document Revised: 02/17/2017 Document Reviewed: 10/29/2014 Elsevier Interactive Patient Education  2019 Elsevier Inc.  

## 2018-02-18 ENCOUNTER — Other Ambulatory Visit: Payer: Self-pay | Admitting: Family Medicine

## 2018-02-21 ENCOUNTER — Other Ambulatory Visit: Payer: Self-pay | Admitting: *Deleted

## 2018-02-21 MED ORDER — DILTIAZEM HCL ER COATED BEADS 120 MG PO CP24: 120.0000 mg | ORAL_CAPSULE | Freq: Every morning | ORAL | 1 refills | Status: DC

## 2018-04-03 ENCOUNTER — Telehealth: Payer: Self-pay | Admitting: *Deleted

## 2018-04-03 NOTE — Telephone Encounter (Signed)
   Primary Cardiologist:  Kate Sable, MD   Patient contacted.  History reviewed.  No symptoms to suggest any unstable cardiac conditions.  Based on discussion, with current pandemic situation, we will be postponing this appointment till July 2020.  If symptoms change, he has been instructed to contact our office.     Marland Kitchen

## 2018-04-04 ENCOUNTER — Ambulatory Visit: Payer: Medicare Other | Admitting: Cardiovascular Disease

## 2018-04-09 ENCOUNTER — Other Ambulatory Visit: Payer: Self-pay | Admitting: Family Medicine

## 2018-05-01 DIAGNOSIS — H401131 Primary open-angle glaucoma, bilateral, mild stage: Secondary | ICD-10-CM | POA: Diagnosis not present

## 2018-06-02 DIAGNOSIS — H401131 Primary open-angle glaucoma, bilateral, mild stage: Secondary | ICD-10-CM | POA: Diagnosis not present

## 2018-07-13 ENCOUNTER — Other Ambulatory Visit: Payer: Medicare Other

## 2018-07-13 ENCOUNTER — Other Ambulatory Visit: Payer: Self-pay

## 2018-07-13 DIAGNOSIS — E119 Type 2 diabetes mellitus without complications: Secondary | ICD-10-CM

## 2018-07-13 DIAGNOSIS — E782 Mixed hyperlipidemia: Secondary | ICD-10-CM

## 2018-07-13 DIAGNOSIS — R7303 Prediabetes: Secondary | ICD-10-CM

## 2018-07-13 LAB — CMP14+EGFR
ALT: 17 IU/L (ref 0–44)
AST: 18 IU/L (ref 0–40)
Albumin/Globulin Ratio: 2.3 — ABNORMAL HIGH (ref 1.2–2.2)
Albumin: 4.3 g/dL (ref 3.7–4.7)
Alkaline Phosphatase: 65 IU/L (ref 39–117)
BUN/Creatinine Ratio: 10 (ref 10–24)
BUN: 12 mg/dL (ref 8–27)
Bilirubin Total: 0.6 mg/dL (ref 0.0–1.2)
CO2: 24 mmol/L (ref 20–29)
Calcium: 9.3 mg/dL (ref 8.6–10.2)
Chloride: 103 mmol/L (ref 96–106)
Creatinine, Ser: 1.19 mg/dL (ref 0.76–1.27)
GFR calc Af Amer: 70 mL/min/{1.73_m2} (ref >59)
GFR calc non Af Amer: 60 mL/min/{1.73_m2} (ref >59)
Globulin, Total: 1.9 g/dL (ref 1.5–4.5)
Glucose: 87 mg/dL (ref 65–99)
Potassium: 4.6 mmol/L (ref 3.5–5.2)
Sodium: 140 mmol/L (ref 134–144)
Total Protein: 6.2 g/dL (ref 6.0–8.5)

## 2018-07-13 LAB — CBC WITH DIFFERENTIAL/PLATELET
Basophils Absolute: 0.1 10*3/uL (ref 0.0–0.2)
Basos: 1 %
EOS (ABSOLUTE): 0.4 10*3/uL (ref 0.0–0.4)
Eos: 5 %
Hematocrit: 46.8 % (ref 37.5–51.0)
Hemoglobin: 15.8 g/dL (ref 13.0–17.7)
Immature Grans (Abs): 0 10*3/uL (ref 0.0–0.1)
Immature Granulocytes: 0 %
Lymphocytes Absolute: 1.2 10*3/uL (ref 0.7–3.1)
Lymphs: 17 %
MCH: 30 pg (ref 26.6–33.0)
MCHC: 33.8 g/dL (ref 31.5–35.7)
MCV: 89 fL (ref 79–97)
Monocytes Absolute: 0.9 10*3/uL (ref 0.1–0.9)
Monocytes: 12 %
Neutrophils Absolute: 4.9 10*3/uL (ref 1.4–7.0)
Neutrophils: 65 %
Platelets: 207 10*3/uL (ref 150–450)
RBC: 5.27 x10E6/uL (ref 4.14–5.80)
RDW: 12.4 % (ref 11.6–15.4)
WBC: 7.4 10*3/uL (ref 3.4–10.8)

## 2018-07-13 LAB — LIPID PANEL
Chol/HDL Ratio: 2.7 ratio (ref 0.0–5.0)
Cholesterol, Total: 106 mg/dL (ref 100–199)
HDL: 40 mg/dL (ref >39)
LDL Calculated: 39 mg/dL (ref 0–99)
Triglycerides: 135 mg/dL (ref 0–149)
VLDL Cholesterol Cal: 27 mg/dL (ref 5–40)

## 2018-07-13 LAB — BAYER DCA HB A1C WAIVED: HB A1C (BAYER DCA - WAIVED): 5.7 % (ref <7.0)

## 2018-07-14 ENCOUNTER — Other Ambulatory Visit: Payer: Self-pay

## 2018-07-17 ENCOUNTER — Other Ambulatory Visit: Payer: Self-pay

## 2018-07-17 ENCOUNTER — Ambulatory Visit: Payer: Medicare Other | Admitting: Family Medicine

## 2018-07-17 ENCOUNTER — Ambulatory Visit (INDEPENDENT_AMBULATORY_CARE_PROVIDER_SITE_OTHER): Payer: Medicare Other | Admitting: Family Medicine

## 2018-07-17 ENCOUNTER — Encounter: Payer: Self-pay | Admitting: Family Medicine

## 2018-07-17 VITALS — BP 105/68 | HR 67 | Temp 97.1°F | Ht 73.0 in | Wt 187.6 lb

## 2018-07-17 DIAGNOSIS — N400 Enlarged prostate without lower urinary tract symptoms: Secondary | ICD-10-CM | POA: Diagnosis not present

## 2018-07-17 DIAGNOSIS — K219 Gastro-esophageal reflux disease without esophagitis: Secondary | ICD-10-CM

## 2018-07-17 DIAGNOSIS — E782 Mixed hyperlipidemia: Secondary | ICD-10-CM

## 2018-07-17 DIAGNOSIS — R7303 Prediabetes: Secondary | ICD-10-CM

## 2018-07-17 MED ORDER — TAMSULOSIN HCL 0.4 MG PO CAPS: 0.8000 mg | ORAL_CAPSULE | Freq: Every day | ORAL | 3 refills | Status: DC

## 2018-07-17 MED ORDER — PRAVASTATIN SODIUM 20 MG PO TABS: 20.0000 mg | ORAL_TABLET | Freq: Every day | ORAL | 3 refills | Status: DC

## 2018-07-17 MED ORDER — DILTIAZEM HCL ER COATED BEADS 120 MG PO CP24: 120.0000 mg | ORAL_CAPSULE | Freq: Every morning | ORAL | 3 refills | Status: DC

## 2018-07-17 MED ORDER — OMEPRAZOLE 20 MG PO CPDR: 20.0000 mg | DELAYED_RELEASE_CAPSULE | Freq: Every day | ORAL | 3 refills | Status: DC

## 2018-07-17 MED ORDER — METOPROLOL SUCCINATE ER 25 MG PO TB24: 25.0000 mg | ORAL_TABLET | Freq: Every day | ORAL | 3 refills | Status: DC

## 2018-07-17 NOTE — Progress Notes (Signed)
BP 105/68   Pulse 67   Temp (!) 97.1 F (36.2 C) (Oral)   Ht _0  (1.854 m)   Wt 187 lb 9.6 oz (85.1 kg)   BMI 24.75 kg/m    Subjective:   Patient ID: Anthony Kelly, male    DOB: [birth year], 73 y.o.   MRN: [id number]  HPI: Anthony Kelly is a 73 y.o. male presenting on 07/17/2018 for Medical Management of Chronic Issues (6 month follow up)   HPI Prediabetes Patient comes in today for recheck of his diabetes. Patient has been currently taking no medication and has been doing good, his A1c is 5.7. Patient is not currently on an ACE inhibitor/ARB. Patient has not seen an ophthalmologist this year. Patient denies any issues with their feet.   Hypertension and paroxysmal A. fib Patient is currently on diltiazem and metoprolol and pravastatin, and their blood pressure today is 105/68 with a heart rate of 67. Patient denies any lightheadedness or dizziness. Patient denies headaches, blurred vision, chest pains, shortness of breath, or weakness. Denies any side effects from medication and is content with current medication.   Hyperlipidemia Patient is coming in for recheck of his hyperlipidemia. The patient is currently taking pravastatin. They deny any issues with myalgias or history of liver damage from it. They deny any focal numbness or weakness or chest pain.   GERD Patient is currently on omeprazole.  She denies any major symptoms or abdominal pain or belching or burping. She denies any blood in her stool or lightheadedness or dizziness.   BPH Patient has BPH and is currently on Flomax 0.8 nightly and says that it is working well for him and he has a good stream and good flow and denies any major issues with it.  He denies abdominal pain or flank pain or blood in his urine.  Relevant past medical, surgical, family and social history reviewed and updated as indicated. Interim medical history since our last visit reviewed. Allergies and medications reviewed and updated.  Review of  Systems  Constitutional: Negative for chills and fever.  Eyes: Negative for visual disturbance.  Respiratory: Negative for shortness of breath and wheezing.   Cardiovascular: Negative for chest pain, palpitations and leg swelling.  Musculoskeletal: Negative for back pain and gait problem.  Skin: Negative for rash.  Neurological: Negative for dizziness, weakness, light-headedness and headaches.  All other systems reviewed and are negative.   Per HPI unless specifically indicated above   Allergies as of 07/17/2018   No Known Allergies     Medication List       Accurate as of July 17, 2018 10:56 AM. If you have any questions, ask your nurse or doctor.        diltiazem 120 MG 24 hr capsule Commonly known as: CARDIZEM CD Take 1 capsule (120 mg total) by mouth every morning.   Lumigan 0.01 % Soln Generic drug: bimatoprost Place 1 drop into both eyes daily.   multivitamin with minerals Tabs tablet Take 1 tablet by mouth every morning.   omeprazole 20 MG capsule Commonly known as: PRILOSEC Take 1 capsule (20 mg total) by mouth daily.   pravastatin 20 MG tablet Commonly known as: PRAVACHOL TAKE 1 TABLET DAILY   PROBIOTIC DAILY PO Take 1 capsule by mouth daily.   tamsulosin 0.4 MG Caps capsule Commonly known as: FLOMAX TAKE 2 CAPSULES DAILY AFTER SUPPER   Toprol XL 25 MG 24 hr tablet Generic drug: metoprolol succinate TAKE 1 TABLET DAILY  Objective:   BP 105/68   Pulse 67   Temp (!) 97.1 F (36.2 C) (Oral)   Ht _0  (1.854 m)   Wt 187 lb 9.6 oz (85.1 kg)   BMI 24.75 kg/m   Wt Readings from Last 3 Encounters:  07/17/18 187 lb 9.6 oz (85.1 kg)  02/17/18 184 lb (83.5 kg)  01/17/18 187 lb (84.8 kg)    Physical Exam Vitals signs and nursing note reviewed.  Constitutional:      General: He is not in acute distress.    Appearance: He is well-developed. He is not diaphoretic.  Eyes:     General: No scleral icterus.       Right eye: No discharge.      Conjunctiva/sclera: Conjunctivae normal.     Pupils: Pupils are equal, round, and reactive to light.  Neck:     Musculoskeletal: Neck supple.     Thyroid: No thyromegaly.  Cardiovascular:     Rate and Rhythm: Normal rate and regular rhythm.     Pulses:          Dorsalis pedis pulses are 2+ on the right side and 2+ on the left side.     Heart sounds: Normal heart sounds. No murmur.  Pulmonary:     Effort: Pulmonary effort is normal. No respiratory distress.     Breath sounds: Normal breath sounds. No wheezing.  Musculoskeletal:        General: No swelling.  Lymphadenopathy:     Cervical: No cervical adenopathy.  Skin:    General: Skin is warm and dry.     Findings: No rash.  Neurological:     Mental Status: He is alert and oriented to person, place, and time.     Coordination: Coordination normal.  Psychiatric:        Behavior: Behavior normal.     Results for orders placed or performed in visit on 07/13/18  CBC with Differential/Platelet  Result Value Ref Range   WBC 7.4 3.4 - 10.8 x10E3/uL   RBC 5.27 4.14 - 5.80 x10E6/uL   Hemoglobin 15.8 13.0 - 17.7 g/dL   Hematocrit 46.8 37.5 - 51.0 %   MCV 89 79 - 97 fL   MCH 30.0 26.6 - 33.0 pg   MCHC 33.8 31.5 - 35.7 g/dL   RDW 12.4 11.6 - 15.4 %   Platelets 207 150 - 450 x10E3/uL   Neutrophils 65 Not Estab. %   Lymphs 17 Not Estab. %   Monocytes 12 Not Estab. %   Eos 5 Not Estab. %   Basos 1 Not Estab. %   Neutrophils Absolute 4.9 1.4 - 7.0 x10E3/uL   Lymphocytes Absolute 1.2 0.7 - 3.1 x10E3/uL   Monocytes Absolute 0.9 0.1 - 0.9 x10E3/uL   EOS (ABSOLUTE) 0.4 0.0 - 0.4 x10E3/uL   Basophils Absolute 0.1 0.0 - 0.2 x10E3/uL   Immature Granulocytes 0 Not Estab. %   Immature Grans (Abs) 0.0 0.0 - 0.1 x10E3/uL  CMP14+EGFR  Result Value Ref Range   Glucose 87 65 - 99 mg/dL   BUN 12 8 - 27 mg/dL   Creatinine, Ser 1.19 0.76 - 1.27 mg/dL   GFR calc non Af Amer 60 >59 mL/min/1.73   GFR calc Af Amer 70 >59 mL/min/1.73    BUN/Creatinine Ratio 10 10 - 24   Sodium 140 134 - 144 mmol/L   Potassium 4.6 3.5 - 5.2 mmol/L   Chloride 103 96 - 106 mmol/L   CO2 24 20 - 29  mmol/L   Calcium 9.3 8.6 - 10.2 mg/dL   Total Protein 6.2 6.0 - 8.5 g/dL   Albumin 4.3 3.7 - 4.7 g/dL   Globulin, Total 1.9 1.5 - 4.5 g/dL   Albumin/Globulin Ratio 2.3 (H) 1.2 - 2.2   Bilirubin Total 0.6 0.0 - 1.2 mg/dL   Alkaline Phosphatase 65 39 - 117 IU/L   AST 18 0 - 40 IU/L   ALT 17 0 - 44 IU/L  Lipid panel  Result Value Ref Range   Cholesterol, Total 106 100 - 199 mg/dL   Triglycerides 135 0 - 149 mg/dL   HDL 40 >39 mg/dL   VLDL Cholesterol Cal 27 5 - 40 mg/dL   LDL Calculated 39 0 - 99 mg/dL   Chol/HDL Ratio 2.7 0.0 - 5.0 ratio  Bayer DCA Hb A1c Waived  Result Value Ref Range   HB A1C (BAYER DCA - WAIVED) 5.7 <7.0 %    Assessment & Plan:   Problem List Items Addressed This Visit      Digestive   GERD (gastroesophageal reflux disease) - Primary   Relevant Medications   omeprazole (PRILOSEC) 20 MG capsule     Genitourinary   BPH (benign prostatic hyperplasia)   Relevant Medications   tamsulosin (FLOMAX) 0.4 MG CAPS capsule     Other   Hyperlipidemia   Relevant Medications   diltiazem (CARDIZEM CD) 120 MG 24 hr capsule   metoprolol succinate (TOPROL XL) 25 MG 24 hr tablet   pravastatin (PRAVACHOL) 20 MG tablet   Prediabetes      Blood work looks really good from 4 days ago, will see back in 6 months, continue current medication, gave refills. Follow up plan: Return in about 6 months (around 01/17/2019), or if symptoms worsen or fail to improve, for Hypertension and cholesterol recheck.  Counseling provided for all of the vaccine components No orders of the defined types were placed in this encounter.   Caryl Pina, MD [facility] 07/17/2018, 10:56 AM

## 2018-07-18 ENCOUNTER — Telehealth: Payer: Medicare Other | Admitting: Cardiovascular Disease

## 2018-08-15 ENCOUNTER — Encounter: Payer: Self-pay | Admitting: Family Medicine

## 2018-08-15 ENCOUNTER — Telehealth: Payer: Self-pay

## 2018-08-15 ENCOUNTER — Ambulatory Visit (INDEPENDENT_AMBULATORY_CARE_PROVIDER_SITE_OTHER): Payer: Medicare Other | Admitting: Family Medicine

## 2018-08-15 DIAGNOSIS — R531 Weakness: Secondary | ICD-10-CM | POA: Diagnosis not present

## 2018-08-15 DIAGNOSIS — R6889 Other general symptoms and signs: Secondary | ICD-10-CM

## 2018-08-15 DIAGNOSIS — R140 Abdominal distension (gaseous): Secondary | ICD-10-CM

## 2018-08-15 DIAGNOSIS — Z20822 Contact with and (suspected) exposure to covid-19: Secondary | ICD-10-CM

## 2018-08-15 DIAGNOSIS — R197 Diarrhea, unspecified: Secondary | ICD-10-CM

## 2018-08-15 DIAGNOSIS — R509 Fever, unspecified: Secondary | ICD-10-CM

## 2018-08-15 NOTE — Progress Notes (Signed)
Virtual Visit via telephone Note Due to COVID-19 pandemic this visit was conducted virtually. This visit type was conducted due to national recommendations for restrictions regarding the COVID-19 Pandemic (e.g. social distancing, sheltering in place) in an effort to limit this patient's exposure and mitigate transmission in our community. All issues noted in this document were discussed and addressed.  A physical exam was not performed with this format.   I connected with Anthony Kelly on 08/15/18 at 1500 by telephone and verified that I am speaking with the correct person using two identifiers. Anthony Kelly is currently located at home and family is currently with them during visit. The provider, Monia Pouch, FNP is located in their office at time of visit.  I discussed the limitations, risks, security and privacy concerns of performing an evaluation and management service by telephone and the availability of in person appointments. I also discussed with the patient that there may be a patient responsible charge related to this service. The patient expressed understanding and agreed to proceed.  Subjective:  Patient ID: Anthony Kelly, male    DOB: 04-28-1945, 73 y.o.   MRN: [id number]  Chief Complaint:  Diarrhea and Fever   HPI: Anthony Kelly is a 73 y.o. male presenting on 08/15/2018 for Diarrhea and Fever   Pt reports 4 days of general weakness, malaise, low grade fever, abdominal pain, and diarrhea. Pt reports he went out to eat at [facility] and then developed symptoms a few days after. States he has the malaise and weakness first, then he developed the low grade fever, abdominal pain, and diarrhea.   Diarrhea  This is a new problem. The current episode started in the past 7 days. The problem occurs 2 to 4 times per day. The problem has been waxing and waning. The stool consistency is described as watery. Associated symptoms include abdominal pain, bloating, chills and a fever.  Pertinent negatives include no arthralgias, coughing, headaches, increased  flatus, myalgias, sweats, URI, vomiting or weight loss. Risk factors: unknown sick exposure. He has tried increased fluids for the symptoms. The treatment provided no relief.  Fever  This is a new problem. The current episode started in the past 7 days. The problem occurs intermittently. The problem has been waxing and waning. The temperature was taken using an oral thermometer. Associated symptoms include abdominal pain and diarrhea. Pertinent negatives include no chest pain, congestion, coughing, ear pain, headaches, muscle aches, nausea, rash, sleepiness, sore throat, urinary pain, vomiting or wheezing. He has tried acetaminophen for the symptoms. The treatment provided moderate relief.     Relevant past medical, surgical, family, and social history reviewed and updated as indicated.  Allergies and medications reviewed and updated.   Past Medical History:  Diagnosis Date  . Acute pericarditis    a. 04/2013 -adm with CP, elevated CRP. H/o coronary artery calcification on prior CT but nuc was negative, EF 71%.  . Arthritis   . Atrial fibrillation ([place])    a. Isolated episode in the setting of acute pericarditis 04/2013. Was not placed on anticoag.  Marland Kitchen BPH (benign prostatic hyperplasia)   . Cataract   . Diverticulitis 08/16/2013  . Hyperlipidemia    elevated triglycerides  . 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 Pain in joint, pelvic region and thigh 08/01/2013  . PSVT (paroxysmal supraventricular tachycardia) ([place])     Past Surgical  History:  Procedure Laterality Date  . CHOLECYSTECTOMY    . EXPLORATORY LAPAROTOMY    . EYE SURGERY  2016   cataract  . LUNG LOBECTOMY     right side  . LYMPH NODE BIOPSY     in groin with removal  . MENISCUS REPAIR     right knee  . PROSTATE SURGERY    . REFRACTIVE SURGERY Right    piece of metal removed  . SUBMANDIBULAR GLAND EXCISION  04/2011  . SUBMANDIBULAR GLAND EXCISION  04/30/2011   Procedure: EXCISION SUBMANDIBULAR GLAND;  Surgeon: Jerrell Belfast, MD;  Location: [facility];  Service: ENT;  Laterality: Left;  WITH DIAGNOSTIC BIOPSY  . TONSILLECTOMY     as a child  . VEIN LIGATION AND STRIPPING     right leg    Social History   Socioeconomic History  . Marital status: Married    Spouse name: Not on file  . Number of children: 2  . Years of education: Not on file  . Highest education level: Some college, no degree  Occupational History  . Occupation: Retired     Comment: Secretary/administrator  . Occupation: Retired     Comment: Barista  . Financial resource strain: Not hard at all  . Food insecurity    Worry: Never true    Inability: Never true  . Transportation needs    Medical: No    Non-medical: No  Tobacco Use  . Smoking status: Former Smoker    Packs/day: 1.50    Years: 25.00    Pack years: 37.50    Types: Cigarettes    Start date: 12/12/1962    Quit date: 01/11/1990    Years since quitting: 28.6  . Smokeless tobacco: Never Used  Substance and Sexual Activity  . Alcohol use: No    Alcohol/week: 0.0 standard drinks  . Drug use: No  . Sexual activity: Not on file  Lifestyle  . Physical activity    Days per week: 0 days    Minutes per session: 0 min  . Stress: Not at all  Relationships  . Social connections    Talks on phone: More than three times a week    Gets together: More than three times a week    Attends religious service: Never    Active member of club or organization: No    Attends meetings of clubs or organizations: Never     Relationship status: Married  . Intimate partner violence    Fear of current or ex partner: No    Emotionally abused: No    Physically abused: No    Forced sexual activity: No  Other Topics Concern  . Not on file  Social History Narrative  . Not on file    Outpatient Encounter Medications as of 08/15/2018  Medication Sig  . diltiazem (CARDIZEM CD) 120 MG 24 hr capsule Take 1 capsule (120 mg total) by mouth every morning.  Marland Kitchen LUMIGAN 0.01 % SOLN Place 1 drop into both eyes daily.  . metoprolol succinate (TOPROL XL) 25 MG 24 hr tablet Take 1 tablet (25 mg total) by mouth daily.  . Multiple Vitamin (MULTIVITAMIN WITH MINERALS) TABS tablet Take 1 tablet  by mouth every morning.   Marland Kitchen omeprazole (PRILOSEC) 20 MG capsule Take 1 capsule (20 mg total) by mouth daily.  . pravastatin (PRAVACHOL) 20 MG tablet Take 1 tablet (20 mg total) by mouth daily.  . Probiotic Product (PROBIOTIC DAILY PO) Take 1 capsule by mouth daily.   . tamsulosin (FLOMAX) 0.4 MG CAPS capsule Take 2 capsules (0.8 mg total) by mouth daily after supper.   No facility-administered encounter medications on file as of 08/15/2018.     No Known Allergies  Review of Systems  Constitutional: Positive for chills, fatigue and fever. Negative for activity change, appetite change, diaphoresis, unexpected weight change and weight loss.  HENT: Negative for congestion, ear pain and sore throat.   Eyes: Negative for photophobia and visual disturbance.  Respiratory: Negative for apnea, cough, choking, chest tightness, shortness of breath, wheezing and stridor.   Cardiovascular: Negative for chest pain, palpitations and leg swelling.  Gastrointestinal: Positive for abdominal distention, abdominal pain, bloating and diarrhea. Negative for anal bleeding, blood in stool, constipation, flatus, nausea, rectal pain and vomiting.  Endocrine: Negative for polydipsia, polyphagia and polyuria.  Genitourinary: Negative for decreased urine volume,  difficulty urinating, dysuria and urgency.  Musculoskeletal: Negative for arthralgias and myalgias.  Skin: Negative for color change, pallor and rash.  Neurological: Positive for weakness. Negative for dizziness, tremors, seizures, syncope, facial asymmetry, speech difficulty, light-headedness, numbness and headaches.  Psychiatric/Behavioral: Negative for confusion.  All other systems reviewed and are negative.        Observations/Objective: No vital signs or physical exam, this was a telephone or virtual health encounter.  Pt alert and oriented, answers all questions appropriately, and able to speak in full sentences.    Assessment and Plan: Anthony Kelly was seen today for diarrhea and fever.  Diagnoses and all orders for this visit:  Diarrhea in adult patient Low grade fever Abdominal bloating Suspected Covid-19 Virus Infection  Weakness generalized Reported symptoms concerning for COVID-19. Will order testing. Symptomatic care and self quarantine discussed in detail. Pt aware of testing sites and hours of operation. Pt aware of symptoms that require emergent evaluation and treatment. Report any new or worsening symptoms. Follow up as needed.  -     Novel Coronavirus, NAA ([facility])     Follow Up Instructions: Return if symptoms worsen or fail to improve.    I discussed the assessment and treatment plan with the patient. The patient was provided an opportunity to ask questions and all were answered. The patient agreed with the plan and demonstrated an understanding of the instructions.   The patient was advised to call back or seek an in-person evaluation if the symptoms worsen or if the condition fails to improve as anticipated.  The above assessment and management plan was discussed with the patient. The patient verbalized understanding of and has agreed to the management plan. Patient is aware to call the clinic if symptoms persist or worsen. Patient is aware when to return to  the clinic for a follow-up visit. Patient educated on when it is appropriate to go to the emergency department.    I provided 15 minutes of non-face-to-face time during this encounter. The call started at 1500. The call ended at 1515. The other time was used for coordination of care.    Monia Pouch, FNP-C [facility] [address] [place], [place] [address] [phone] 08/15/18

## 2018-08-15 NOTE — Addendum Note (Signed)
Addended by: Baruch Gouty on: 08/15/2018 04:25 PM   Modules accepted: Orders

## 2018-08-15 NOTE — Telephone Encounter (Signed)
Pt's spouse called to cancel appt's on 8/7 d/t pt has fever and diarrhea.   Pt's PCP is sending him for a covid test tomorrow. Msg has been sent to schedulers to call and get him rescheduled.

## 2018-08-15 NOTE — Progress Notes (Addendum)
Called multiple times with no answer.   Wife called around 1545 asking when visit would be completed. New number provided.

## 2018-08-16 ENCOUNTER — Telehealth: Payer: Self-pay | Admitting: Hematology and Oncology

## 2018-08-16 ENCOUNTER — Other Ambulatory Visit: Payer: Self-pay

## 2018-08-16 DIAGNOSIS — Z20822 Contact with and (suspected) exposure to covid-19: Secondary | ICD-10-CM

## 2018-08-16 DIAGNOSIS — R509 Fever, unspecified: Secondary | ICD-10-CM | POA: Diagnosis not present

## 2018-08-16 DIAGNOSIS — R140 Abdominal distension (gaseous): Secondary | ICD-10-CM | POA: Diagnosis not present

## 2018-08-16 DIAGNOSIS — R6889 Other general symptoms and signs: Secondary | ICD-10-CM | POA: Diagnosis not present

## 2018-08-16 DIAGNOSIS — R531 Weakness: Secondary | ICD-10-CM | POA: Diagnosis not present

## 2018-08-16 DIAGNOSIS — R197 Diarrhea, unspecified: Secondary | ICD-10-CM | POA: Diagnosis not present

## 2018-08-16 NOTE — Telephone Encounter (Signed)
R/s appt per 8/5 sch message - pt wife   aware of appt date and time

## 2018-08-17 LAB — NOVEL CORONAVIRUS, NAA: SARS-CoV-2, NAA: NOT DETECTED

## 2018-08-18 ENCOUNTER — Inpatient Hospital Stay: Payer: Medicare Other | Admitting: Hematology and Oncology

## 2018-08-18 ENCOUNTER — Inpatient Hospital Stay: Payer: Medicare Other

## 2018-08-25 ENCOUNTER — Other Ambulatory Visit: Payer: Self-pay

## 2018-08-25 ENCOUNTER — Other Ambulatory Visit: Payer: Medicare Other

## 2018-08-25 ENCOUNTER — Encounter: Payer: Self-pay | Admitting: Hematology and Oncology

## 2018-08-25 ENCOUNTER — Inpatient Hospital Stay: Payer: Medicare Other | Attending: Hematology and Oncology | Admitting: Hematology and Oncology

## 2018-08-25 DIAGNOSIS — Z9221 Personal history of antineoplastic chemotherapy: Secondary | ICD-10-CM | POA: Diagnosis not present

## 2018-08-25 DIAGNOSIS — I2510 Atherosclerotic heart disease of native coronary artery without angina pectoris: Secondary | ICD-10-CM | POA: Insufficient documentation

## 2018-08-25 DIAGNOSIS — Z79899 Other long term (current) drug therapy: Secondary | ICD-10-CM | POA: Insufficient documentation

## 2018-08-25 DIAGNOSIS — Z9225 Personal history of immunosupression therapy: Secondary | ICD-10-CM | POA: Insufficient documentation

## 2018-08-25 DIAGNOSIS — Z8572 Personal history of non-Hodgkin lymphomas: Secondary | ICD-10-CM | POA: Insufficient documentation

## 2018-08-25 NOTE — Progress Notes (Signed)
[place] OFFICE PROGRESS NOTE  Patient Care Team: Dettinger, [name], [name], [name], [name], [name], [name], MD as Consulting Physician (Dermatology)  ASSESSMENT & PLAN:  History of B-cell lymphoma Clinically, he has no signs of cancer recurrence His CT imaging show no evidence of disease His recent blood work is normal I will see him back in a year with history, physical examination and blood work I do not recommend surveillance imaging study unless he have signs or symptoms of cancer recurrence We discussed the importance of annual influenza vaccination   No orders of the defined types were placed in this encounter.   INTERVAL HISTORY: Please see below for problem oriented charting. 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   12/12/2003 - 06/01/2004 Chemotherapy   He was treated with R. CHOP chemotherapy which show complete remission. The number of cycles of R. CHOP chemotherapy was unknown.   12/19/2006 Surgery   Lung resection show follicular lymphoma.   01/02/2007 - 09/01/2008 Chemotherapy   The patient was treated with single agent rituximab alone.   01/19/2007 Bone Marrow  Biopsy   Bone marrow biopsy was negative.   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   05/08/2013 - 05/11/2013 Hospital Admission   The patient was admitted to the hospital for management of pericarditis. CT scan showed extensive lymphadenopathy.   06/07/2013 Imaging   PET/CT scan showed extensive lymphadenopathy   06/25/2013 Procedure   He has placement of Infuse-a-Port.   06/28/2013 Bone Marrow Biopsy   Bone marrow biopsy is positive for lymphoma involvement.   07/04/2013 - 11/22/2013 Chemotherapy   He is treated with 6 cycles of bendamustine with rituximab.   09/24/2013 Imaging   Repeat PET scan show complete remission.   12/26/2013 Imaging   PEt scan showed complete remission   09/26/2014 Imaging   CT scan of the chest abdomen and pelvis show no evidence of disease   03/26/2015 Imaging   CT scan showed no evidence of lymphoma   04/14/2016 Imaging   CT: Borderline prominent right hilar and subcarinal lymph nodes, but not appreciably changed. 2. Low-grade but increased central mesenteric stranding with some small mesenteric lymph nodes. This could certainly be inflammatory, 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, [name], volume estimated at 75 cubic cm.   02/15/2017 Imaging  No evidence of recurrent lymphoma or other acute findings.  Stable moderate hiatal hernia.  Colonic diverticulosis, without radiographic evidence of diverticulitis.  Stable mildly  enlarged prostate.  Mild emphysema.  Aortic and coronary artery atherosclerosis.     REVIEW OF SYSTEMS:   Constitutional: Denies fevers, chills or abnormal weight loss Eyes: Denies blurriness of vision Ears, nose, mouth, throat, and face: Denies mucositis or sore throat Respiratory: Denies cough, dyspnea or wheezes Cardiovascular: Denies palpitation, chest discomfort or lower extremity swelling Gastrointestinal:  Denies nausea, heartburn or change in bowel habits Skin: Denies abnormal skin rashes Lymphatics: Denies new lymphadenopathy or easy bruising Neurological:Denies numbness, tingling or new weaknesses Behavioral/Psych: Mood is stable, no new changes  All other systems were reviewed with the patient and are negative.  I have reviewed the past medical history, past surgical history, social history and family history with the patient and they are unchanged from previous note.  ALLERGIES:  has No Known Allergies.  MEDICATIONS:  Current Outpatient Medications  Medication Sig Dispense Refill  . diltiazem (CARDIZEM CD) 120 MG 24 hr capsule Take 1 capsule (120 mg total) by mouth every morning. 90 capsule 3  . LUMIGAN 0.01 % SOLN Place 1 drop into both eyes daily.    . metoprolol succinate (TOPROL XL) 25 MG 24 hr tablet Take 1 tablet (25 mg total) by mouth daily. 90 tablet 3  . Multiple Vitamin (MULTIVITAMIN WITH MINERALS) TABS tablet Take 1 tablet by mouth every morning.     Marland Kitchen omeprazole (PRILOSEC) 20 MG capsule Take 1 capsule (20 mg total) by mouth daily. 90 capsule 3  . pravastatin (PRAVACHOL) 20 MG tablet Take 1 tablet (20 mg total) by mouth daily. 90 tablet 3  . Probiotic Product (PROBIOTIC DAILY PO) Take 1 capsule by mouth daily.     . tamsulosin (FLOMAX) 0.4 MG CAPS capsule Take 2 capsules (0.8 mg total) by mouth daily after supper. 180 capsule 3   No current facility-administered medications for this visit.     PHYSICAL EXAMINATION: ECOG PERFORMANCE STATUS: 0 -  Asymptomatic  Vitals:   08/25/18 1226  BP: 112/71  Pulse: 66  Resp: 18  Temp: 98.7 F (37.1 C)  SpO2: 98%   Filed Weights   08/25/18 1226  Weight: 187 lb 11.2 oz (85.1 kg)    GENERAL:alert, no distress and comfortable SKIN: skin color, texture, turgor are normal, no rashes or significant lesions EYES: normal, Conjunctiva are pink and non-injected, sclera clear OROPHARYNX:no exudate, no erythema and lips, buccal mucosa, and tongue normal  NECK: supple, thyroid normal size, non-tender, without nodularity LYMPH:  no palpable lymphadenopathy in the cervical, axillary or inguinal LUNGS: clear to auscultation and percussion with normal breathing effort HEART: regular rate & rhythm and no murmurs and no lower extremity edema ABDOMEN:abdomen soft, mild discomfort along the epigastric scar but no palpable mass Musculoskeletal:no cyanosis of digits and no clubbing  NEURO: alert & oriented x 3 with fluent speech, no focal motor/sensory deficits  LABORATORY DATA:  I have reviewed the data as listed    Component Value Date/Time   NA 140 07/13/2018 1031   NA 139 04/14/2016 0943   K 4.6 07/13/2018 1031   K 4.6 04/14/2016 0943   CL 103 07/13/2018 1031   CL 104 03/24/2012 1024   CO2 24 07/13/2018 1031   CO2 25 04/14/2016 0943   GLUCOSE 87 07/13/2018 1031   GLUCOSE 98 02/08/2017 0805   GLUCOSE 111 04/14/2016 0943   GLUCOSE 80 03/24/2012 1024  BUN 12 07/13/2018 1031   BUN 8.6 04/14/2016 0943   CREATININE 1.19 07/13/2018 1031   CREATININE 1.1 04/14/2016 0943   CALCIUM 9.3 07/13/2018 1031   CALCIUM 9.4 04/14/2016 0943   PROT 6.2 07/13/2018 1031   PROT 6.5 04/14/2016 0943   ALBUMIN 4.3 07/13/2018 1031   ALBUMIN 3.8 04/14/2016 0943   AST 18 07/13/2018 1031   AST 28 04/14/2016 0943   ALT 17 07/13/2018 1031   ALT 41 04/14/2016 0943   ALKPHOS 65 07/13/2018 1031   ALKPHOS 71 04/14/2016 0943   BILITOT 0.6 07/13/2018 1031   BILITOT 0.61 04/14/2016 0943   GFRNONAA 60 07/13/2018 1031    GFRAA 70 07/13/2018 1031    No results found for: SPEP, UPEP  Lab Results  Component Value Date   WBC 7.4 07/13/2018   NEUTROABS 4.9 07/13/2018   HGB 15.8 07/13/2018   HCT 46.8 07/13/2018   MCV 89 07/13/2018   PLT 207 07/13/2018      Chemistry      Component Value Date/Time   NA 140 07/13/2018 1031   NA 139 04/14/2016 0943   K 4.6 07/13/2018 1031   K 4.6 04/14/2016 0943   CL 103 07/13/2018 1031   CL 104 03/24/2012 1024   CO2 24 07/13/2018 1031   CO2 25 04/14/2016 0943   BUN 12 07/13/2018 1031   BUN 8.6 04/14/2016 0943   CREATININE 1.19 07/13/2018 1031   CREATININE 1.1 04/14/2016 0943      Component Value Date/Time   CALCIUM 9.3 07/13/2018 1031   CALCIUM 9.4 04/14/2016 0943   ALKPHOS 65 07/13/2018 1031   ALKPHOS 71 04/14/2016 0943   AST 18 07/13/2018 1031   AST 28 04/14/2016 0943   ALT 17 07/13/2018 1031   ALT 41 04/14/2016 0943   BILITOT 0.6 07/13/2018 1031   BILITOT 0.61 04/14/2016 0943     I have reviewed his recent CT from 2019  All questions were answered. The patient knows to call the clinic with any problems, questions or concerns. No barriers to learning was detected.  I spent 10 minutes counseling the patient face to face. The total time spent in the appointment was 15 minutes and more than 50% was on counseling and review of test results  [name], MD 08/25/2018 4:13 PM

## 2018-08-25 NOTE — Assessment & Plan Note (Signed)
Clinically, he has no signs of cancer recurrence His CT imaging show no evidence of disease His recent blood work is normal I will see him back in a year with history, physical examination and blood work I do not recommend surveillance imaging study unless he have signs or symptoms of cancer recurrence We discussed the importance of annual influenza vaccination

## 2018-08-28 ENCOUNTER — Telehealth: Payer: Self-pay | Admitting: Hematology and Oncology

## 2018-08-28 NOTE — Telephone Encounter (Signed)
Called patient regarding upcoming appointments scheduled per 08/14 los, patient is aware and is notified on Mychart.

## 2018-10-06 DIAGNOSIS — H401131 Primary open-angle glaucoma, bilateral, mild stage: Secondary | ICD-10-CM | POA: Diagnosis not present

## 2018-10-11 ENCOUNTER — Ambulatory Visit (INDEPENDENT_AMBULATORY_CARE_PROVIDER_SITE_OTHER): Payer: Medicare Other | Admitting: Cardiovascular Disease

## 2018-10-11 ENCOUNTER — Other Ambulatory Visit: Payer: Self-pay

## 2018-10-11 ENCOUNTER — Encounter: Payer: Self-pay | Admitting: Cardiovascular Disease

## 2018-10-11 VITALS — BP 127/80 | HR 75 | Ht 74.0 in | Wt 189.8 lb

## 2018-10-11 DIAGNOSIS — I5189 Other ill-defined heart diseases: Secondary | ICD-10-CM

## 2018-10-11 DIAGNOSIS — I2510 Atherosclerotic heart disease of native coronary artery without angina pectoris: Secondary | ICD-10-CM

## 2018-10-11 DIAGNOSIS — R002 Palpitations: Secondary | ICD-10-CM | POA: Diagnosis not present

## 2018-10-11 DIAGNOSIS — I480 Paroxysmal atrial fibrillation: Secondary | ICD-10-CM

## 2018-10-11 NOTE — Progress Notes (Signed)
SUBJECTIVE: The patient presents for routine follow-up.  He has a history of PSVT and PAF which was an isolated instance in the setting of acute pericarditis several years back.  ECG performed in the office today which I ordered and personally interpreted demonstrates normal sinus rhythm with no ischemic ST segment or T-wave abnormalities, nor any arrhythmias.  The patient denies any symptoms of chest pain, palpitations, shortness of breath, lightheadedness, dizziness, leg swelling, orthopnea, PND, and syncope.  He started rebuilding a tractor recently.   Review of Systems: As per "subjective", otherwise negative.  No Known Allergies  Current Outpatient Medications  Medication Sig Dispense Refill  . diltiazem (CARDIZEM CD) 120 MG 24 hr capsule Take 1 capsule (120 mg total) by mouth every morning. 90 capsule 3  . LUMIGAN 0.01 % SOLN Place 1 drop into both eyes daily.    . Magnesium 250 MG TABS Take 1 tablet by mouth daily.    . metoprolol succinate (TOPROL XL) 25 MG 24 hr tablet Take 1 tablet (25 mg total) by mouth daily. 90 tablet 3  . Multiple Vitamin (MULTIVITAMIN WITH MINERALS) TABS tablet Take 1 tablet by mouth every morning.     . NON FORMULARY Take 10 mg by mouth daily. CBD OIL    . omeprazole (PRILOSEC) 20 MG capsule Take 1 capsule (20 mg total) by mouth daily. 90 capsule 3  . pravastatin (PRAVACHOL) 20 MG tablet Take 1 tablet (20 mg total) by mouth daily. 90 tablet 3  . Probiotic Product (PROBIOTIC DAILY PO) Take 1 capsule by mouth daily.     . tamsulosin (FLOMAX) 0.4 MG CAPS capsule Take 2 capsules (0.8 mg total) by mouth daily after supper. 180 capsule 3   No current facility-administered medications for this visit.     Past Medical History:  Diagnosis Date  . Acute pericarditis    a. 04/2013 -adm with CP, elevated CRP. H/o coronary artery calcification on prior CT but nuc was negative, EF 71%.  . Arthritis   . Atrial fibrillation ([place])    a. Isolated episode in  the setting of acute pericarditis 04/2013. Was not placed on anticoag.  Marland Kitchen BPH (benign prostatic hyperplasia)   . Cataract   . Diverticulitis 08/16/2013  . Hyperlipidemia    elevated triglycerides  . Low grade B-cell lymphoma ([place]) 05/11/2011   Initial dx 6/04 left inguinal adenopathy Rx observation; convert to hi grade 11/05 Rx CHOP-R; lesion right lung resected 12/08: low grade NHL; new lesion left submandibular gland 2/13  resected 04/30/11 lo grade NHL  . Malignant lymphoma, high grade ([place]) 03/14/2011  . 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 Pain in joint, pelvic region and thigh 08/01/2013  . PSVT (paroxysmal supraventricular tachycardia) ([place])     Past Surgical History:  Procedure Laterality Date  . CHOLECYSTECTOMY    . EXPLORATORY LAPAROTOMY    . EYE SURGERY  2016   cataract  . LUNG LOBECTOMY     right side  . LYMPH NODE BIOPSY     in groin with removal  . MENISCUS REPAIR     right knee  . PROSTATE SURGERY    . REFRACTIVE SURGERY Right    piece of metal removed  . SUBMANDIBULAR GLAND EXCISION  04/2011  . SUBMANDIBULAR GLAND EXCISION  04/30/2011   Procedure: EXCISION SUBMANDIBULAR GLAND;  Surgeon: Jerrell Belfast, MD;  Location: [facility];  Service: ENT;  Laterality: Left;  WITH DIAGNOSTIC BIOPSY  . TONSILLECTOMY     as a child  . VEIN LIGATION AND STRIPPING     right leg    Social History   Socioeconomic History  . Marital status: Married    Spouse name: Not on file  . Number of children: 2  . Years of education: Not on file  . Highest education level: Some college, no degree  Occupational History  . Occupation: Retired     Comment: Secretary/administrator  . Occupation: Retired     Comment: Barista  . Financial resource strain: Not hard at all  . Food insecurity    Worry: Never true    Inability: Never true  . Transportation needs    Medical: No    Non-medical: No  Tobacco Use  .  Smoking status: Former Smoker    Packs/day: 1.50    Years: 25.00    Pack years: 37.50    Types: Cigarettes    Start date: 12/12/1962    Quit date: 01/11/1990    Years since quitting: 28.7  . Smokeless tobacco: Never Used  Substance and Sexual Activity  . Alcohol use: No    Alcohol/week: 0.0 standard drinks  . Drug use: No  . Sexual activity: Not on file  Lifestyle  . Physical activity    Days per week: 0 days    Minutes per session: 0 min  . Stress: Not at all  Relationships  . Social connections    Talks on phone: More than three times a week    Gets together: More than three times a week    Attends religious service: Never    Active member of club or organization: No    Attends meetings of clubs or organizations: Never    Relationship status: Married  . Intimate partner violence    Fear of current or ex partner: No    Emotionally abused: No    Physically abused: No    Forced sexual activity: No  Other Topics Concern  . Not on file  Social History Narrative  . Not on file     Vitals:   10/11/18 1033  BP: 127/80  Pulse: 75  SpO2: 97%  Weight: 189 lb 12.8 oz (86.1 kg)  Height: 6\' 2"  (1.88 m)    Wt Readings from Last 3 Encounters:  10/11/18 189 lb 12.8 oz (86.1 kg)  08/25/18 187 lb 11.2 oz (85.1 kg)  07/17/18 187 lb 9.6 oz (85.1 kg)     PHYSICAL EXAM General: NAD HEENT: Normal. Neck: No JVD, no thyromegaly. Lungs: Clear to auscultation bilaterally with normal respiratory effort. CV: Regular rate and rhythm, normal S1/S2, no S3/S4, no murmur. No pretibial or periankle edema.  No carotid bruit.   Abdomen: Soft, nontender, no distention.  Neurologic: Alert and oriented.  Psych: Normal affect. Skin: Normal. Musculoskeletal: No gross deformities.      Labs: Lab Results  Component Value Date/Time   K 4.6 07/13/2018 10:31 AM   K 4.6 04/14/2016 09:43 AM   BUN 12 07/13/2018 10:31 AM   BUN 8.6 04/14/2016 09:43 AM   CREATININE 1.19 07/13/2018 10:31 AM    CREATININE 1.1 04/14/2016 09:43 AM   ALT 17 07/13/2018 10:31 AM   ALT 41 04/14/2016 09:43 AM   TSH 1.690 01/14/2017 11:19 AM   HGB 15.8 07/13/2018 10:31 AM   HGB 15.6 04/14/2016 09:42 AM     Lipids: Lab Results  Component Value Date/Time  [place] 39 07/13/2018 10:31 AM   LDLCALC 77 05/30/2012 09:09 AM   CHOL 106 07/13/2018 10:31 AM   CHOL 144 05/30/2012 09:09 AM   TRIG 135 07/13/2018 10:31 AM   TRIG 140 05/20/2014 11:03 AM   TRIG 153 (H) 05/30/2012 09:09 AM   HDL 40 07/13/2018 10:31 AM   HDL 42 05/20/2014 11:03 AM   HDL 36 (L) 05/30/2012 09:09 AM       ASSESSMENT AND PLAN: 1. Pericarditis: No recurrence of symptoms.  2. PSVT/PAF: Symptomatically stable.  No recurrences. This was reportedly an isolated episode in the setting of acute pericarditis. Thus, was not placed on anticoagulation. Continue long-acting diltiazem and long-acting metoprolol.   3. Coronary artery calcifications on CT: Symptomatically stable. Normal nuclear MPI study on 05/11/13, thus no further testing is indicated at this time. Continue beta blocker and statin therapy.    4. Grade II diastolic dysfunction: No evidence of heart failure.BP is normal.    Disposition: Follow up as needed   Kate Sable, M.D., F.A.C.C.

## 2018-10-11 NOTE — Patient Instructions (Signed)
Medication Instructions:  Continue all current medications.  Labwork: none  Testing/Procedures: none  Follow-Up: As needed.    Any Other Special Instructions Will Be Listed Below (If Applicable).  If you need a refill on your cardiac medications before your next appointment, please call your pharmacy.  

## 2018-10-23 DIAGNOSIS — Z23 Encounter for immunization: Secondary | ICD-10-CM | POA: Diagnosis not present

## 2019-01-17 ENCOUNTER — Ambulatory Visit: Payer: Medicare Other | Admitting: Family Medicine

## 2019-01-24 ENCOUNTER — Encounter: Payer: Self-pay | Admitting: Hematology and Oncology

## 2019-01-25 ENCOUNTER — Telehealth: Payer: Self-pay | Admitting: *Deleted

## 2019-01-25 ENCOUNTER — Other Ambulatory Visit: Payer: Self-pay | Admitting: Hematology and Oncology

## 2019-01-25 DIAGNOSIS — Z8572 Personal history of non-Hodgkin lymphomas: Secondary | ICD-10-CM

## 2019-01-25 NOTE — Telephone Encounter (Signed)
Telephone call to patient in regards to MyChart message received. Patient continues to have abd pain and increased SOB. He would like to come in for a visit and lab work. He states today is not a good day for him to come in as he has an appt for his gas to be refilled at home. He would like to come in tomorrow. He feels comfortable waiting until tomorrow. He knows when to seek emergency care.  Scheduling message sent for 9:15 lab and to see Dr. Alvy Bimler at 945 on Friday 1/15

## 2019-01-26 ENCOUNTER — Inpatient Hospital Stay: Payer: Medicare Other

## 2019-01-26 ENCOUNTER — Other Ambulatory Visit: Payer: Self-pay

## 2019-01-26 ENCOUNTER — Telehealth: Payer: Self-pay | Admitting: Hematology and Oncology

## 2019-01-26 ENCOUNTER — Encounter: Payer: Self-pay | Admitting: Hematology and Oncology

## 2019-01-26 ENCOUNTER — Inpatient Hospital Stay: Payer: Medicare Other | Attending: Hematology and Oncology | Admitting: Hematology and Oncology

## 2019-01-26 VITALS — BP 106/71 | HR 65 | Temp 98.3°F | Resp 18 | Ht 74.0 in | Wt 187.5 lb

## 2019-01-26 DIAGNOSIS — R0609 Other forms of dyspnea: Secondary | ICD-10-CM | POA: Insufficient documentation

## 2019-01-26 DIAGNOSIS — Z79899 Other long term (current) drug therapy: Secondary | ICD-10-CM | POA: Diagnosis not present

## 2019-01-26 DIAGNOSIS — Z8572 Personal history of non-Hodgkin lymphomas: Secondary | ICD-10-CM

## 2019-01-26 DIAGNOSIS — I2510 Atherosclerotic heart disease of native coronary artery without angina pectoris: Secondary | ICD-10-CM | POA: Diagnosis not present

## 2019-01-26 DIAGNOSIS — R42 Dizziness and giddiness: Secondary | ICD-10-CM | POA: Insufficient documentation

## 2019-01-26 DIAGNOSIS — Z9221 Personal history of antineoplastic chemotherapy: Secondary | ICD-10-CM | POA: Insufficient documentation

## 2019-01-26 DIAGNOSIS — R109 Unspecified abdominal pain: Secondary | ICD-10-CM | POA: Insufficient documentation

## 2019-01-26 DIAGNOSIS — R0600 Dyspnea, unspecified: Secondary | ICD-10-CM

## 2019-01-26 DIAGNOSIS — R143 Flatulence: Secondary | ICD-10-CM | POA: Diagnosis not present

## 2019-01-26 DIAGNOSIS — C8510 Unspecified B-cell lymphoma, unspecified site: Secondary | ICD-10-CM | POA: Diagnosis not present

## 2019-01-26 DIAGNOSIS — R1013 Epigastric pain: Secondary | ICD-10-CM | POA: Diagnosis not present

## 2019-01-26 DIAGNOSIS — J439 Emphysema, unspecified: Secondary | ICD-10-CM | POA: Insufficient documentation

## 2019-01-26 LAB — CBC WITH DIFFERENTIAL/PLATELET
Abs Immature Granulocytes: 0.02 10*3/uL (ref 0.00–0.07)
Basophils Absolute: 0.1 10*3/uL (ref 0.0–0.1)
Basophils Relative: 1 %
Eosinophils Absolute: 0.2 10*3/uL (ref 0.0–0.5)
Eosinophils Relative: 3 %
HCT: 49.1 % (ref 39.0–52.0)
Hemoglobin: 16.5 g/dL (ref 13.0–17.0)
Immature Granulocytes: 0 %
Lymphocytes Relative: 16 %
Lymphs Abs: 1.1 10*3/uL (ref 0.7–4.0)
MCH: 29.8 pg (ref 26.0–34.0)
MCHC: 33.6 g/dL (ref 30.0–36.0)
MCV: 88.8 fL (ref 80.0–100.0)
Monocytes Absolute: 0.6 10*3/uL (ref 0.1–1.0)
Monocytes Relative: 8 %
Neutro Abs: 5 10*3/uL (ref 1.7–7.7)
Neutrophils Relative %: 72 %
Platelets: 182 10*3/uL (ref 150–400)
RBC: 5.53 MIL/uL (ref 4.22–5.81)
RDW: 12.8 % (ref 11.5–15.5)
WBC: 6.9 10*3/uL (ref 4.0–10.5)
nRBC: 0 % (ref 0.0–0.2)

## 2019-01-26 LAB — LACTATE DEHYDROGENASE: LDH: 133 U/L (ref 98–192)

## 2019-01-26 LAB — COMPREHENSIVE METABOLIC PANEL
ALT: 17 U/L (ref 0–44)
AST: 16 U/L (ref 15–41)
Albumin: 4.2 g/dL (ref 3.5–5.0)
Alkaline Phosphatase: 68 U/L (ref 38–126)
Anion gap: 8 (ref 5–15)
BUN: 15 mg/dL (ref 8–23)
CO2: 26 mmol/L (ref 22–32)
Calcium: 9.1 mg/dL (ref 8.9–10.3)
Chloride: 106 mmol/L (ref 98–111)
Creatinine, Ser: 1.17 mg/dL (ref 0.61–1.24)
GFR calc Af Amer: >60 mL/min (ref >60)
GFR calc non Af Amer: >60 mL/min (ref >60)
Glucose, Bld: 103 mg/dL — ABNORMAL HIGH (ref 70–99)
Potassium: 5.1 mmol/L (ref 3.5–5.1)
Sodium: 140 mmol/L (ref 135–145)
Total Bilirubin: 0.6 mg/dL (ref 0.3–1.2)
Total Protein: 7 g/dL (ref 6.5–8.1)

## 2019-01-26 NOTE — Telephone Encounter (Signed)
Scheduled appt per 1/15 sch message - pt wife aware of appt date and time

## 2019-01-26 NOTE — Assessment & Plan Note (Signed)
Even though his blood work is normal, I am concerned about his signs and symptoms of nonspecific pain in the epigastric region associated with shortness of breath and recent changes in his blood pressure The patient is high risk for cancer recurrence I plan to order CT imaging next week to assess and I will see him to review test results

## 2019-01-26 NOTE — Assessment & Plan Note (Signed)
The cause is unknown I recommend he modify his diet and avoid dairy products in case he is developing some sort of malabsorption or lactose intolerance as a cause of his excessive flatus

## 2019-01-26 NOTE — Assessment & Plan Note (Signed)
He has abdominal pain on exam palpable near the epigastric region without rebound or guarding I plan to order CT scan of the chest, abdomen and pelvis with contrast for further evaluation I offer him prescription pain medicine but the patient felt he is okay to wait until next week I recommend acetaminophen as needed

## 2019-01-26 NOTE — Assessment & Plan Note (Signed)
The cause is unknown I will order CT scan with contrast for evaluation His blood pressure at home is noted to be low I recommend he checks his blood pressure twice a day and I will review with him again next week with test results

## 2019-01-26 NOTE — Progress Notes (Signed)
[place] OFFICE PROGRESS NOTE  Patient Care Team: Dettinger, [name], [name], [name], [name], [name], [name], MD as Consulting Physician (Dermatology)  ASSESSMENT & PLAN:  History of B-cell lymphoma Even though his blood work is normal, I am concerned about his signs and symptoms of nonspecific pain in the epigastric region associated with shortness of breath and recent changes in his blood pressure The patient is high risk for cancer recurrence I plan to order CT imaging next week to assess and I will see him to review test results  Dyspnea on effort The cause is unknown I will order CT scan with contrast for evaluation His blood pressure at home is noted to be low I recommend he checks his blood pressure twice a day and I will review with him again next week with test results  Abdominal pain He has abdominal pain on exam palpable near the epigastric region without rebound or guarding I plan to order CT scan of the chest, abdomen and pelvis with contrast for further evaluation I offer him prescription pain medicine but the patient felt he is okay to wait until next week I recommend acetaminophen as needed  Excessive flatus The cause is unknown I recommend he modify his diet and avoid dairy products in case he is developing some sort of malabsorption or lactose intolerance as a cause of his excessive flatus   Orders Placed This Encounter  Procedures  . CT Abdomen Pelvis W Contrast    Standing Status:   Future    Standing Expiration Date:   01/26/2020    Order Specific Question:   If indicated for the ordered procedure, I authorize the administration of contrast media per Radiology protocol     Answer:   Yes    Order Specific Question:   Preferred imaging location?    Answer:   [facility]    Order Specific Question:   Radiology Contrast Protocol - do NOT remove file path    Answer:   \\[email or web address]    Order Specific Question:   ** REASON FOR EXAM (FREE TEXT)    Answer:   abdominal pain, new onset dyspnea  . CT Chest W Contrast    Standing Status:   Future    Standing Expiration Date:   01/26/2020    Order Specific Question:   If indicated for the ordered procedure, I authorize the administration of contrast media per Radiology protocol    Answer:   Yes    Order Specific Question:   Preferred imaging location?    Answer:   [facility]    Order Specific Question:   Radiology Contrast Protocol - do NOT remove file path    Answer:   \\[email or web address]    Order Specific Question:   ** REASON FOR EXAM (FREE TEXT)    Answer:   abdominal pain, new onset dyspnea    All questions were answered. The patient knows to call the clinic with any problems, questions or concerns. The total time spent in the appointment was 30 minutes encounter with patients including review of chart and various tests results, [name], MD 01/26/2019 12:20 PM  INTERVAL HISTORY: Please see below for problem oriented charting.  He is seen as an add-on urgently due to symptoms of new onset right upper quadrant pain radiating around his epigastrium to the back He has been present for about a week or 2 He has also sensation of shortness of breath on minimal exertion A few days ago, he had some mild dizziness/vertigo His blood pressure at home has been borderline low normal.  His heart rate at 1 time has dropped to around 48 He also complain of significant gassiness and passage of foul-smelling flatus.  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   12/12/2003 - 06/01/2004 Chemotherapy   He was treated with R. CHOP chemotherapy which show complete remission. The number of cycles of R. CHOP chemotherapy was unknown.   12/19/2006 Surgery   Lung resection show follicular lymphoma.   01/02/2007 - 09/01/2008 Chemotherapy   The patient was treated with single agent rituximab alone.   01/19/2007 Bone Marrow Biopsy   Bone marrow biopsy was negative.   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   05/08/2013 - 05/11/2013 Hospital Admission   The patient was admitted to the hospital for management of pericarditis. CT scan showed extensive lymphadenopathy.   06/07/2013 Imaging   PET/CT scan showed extensive lymphadenopathy   06/25/2013 Procedure   He has placement of Infuse-a-Port.   06/28/2013 Bone Marrow Biopsy   Bone marrow biopsy is positive for lymphoma involvement.   07/04/2013 - 11/22/2013 Chemotherapy   He is treated with 6 cycles of bendamustine with rituximab.   09/24/2013 Imaging   Repeat PET scan show complete remission.   12/26/2013 Imaging   PEt scan showed complete remission   09/26/2014 Imaging   CT scan of the chest abdomen and pelvis show no evidence of disease   03/26/2015 Imaging   CT scan showed no evidence of lymphoma   04/14/2016 Imaging   CT: Borderline prominent right hilar and subcarinal lymph nodes, but not appreciably changed. 2. Low-grade but increased central mesenteric stranding with some small mesenteric lymph nodes. This could certainly be inflammatory, and there is no bulky adenopathy to suggest a malignant etiology.  3. Coronary  and aortoiliac atherosclerotic calcification. 4. Centrilobular and paraseptal emphysema. Postoperative findings in the right lung. 5. 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, [name], volume estimated at 75 cubic cm.   02/15/2017 Imaging   No evidence of recurrent lymphoma or other acute findings.  Stable moderate hiatal hernia.  Colonic diverticulosis, without radiographic evidence of diverticulitis.  Stable mildly enlarged prostate.  Mild emphysema.  Aortic and coronary artery atherosclerosis.     REVIEW OF SYSTEMS:   Constitutional: Denies fevers, chills or abnormal weight loss Eyes: Denies blurriness of vision Ears, nose, mouth, throat, and face: Denies mucositis or sore throat Cardiovascular: Denies palpitation, chest discomfort or lower extremity swelling Skin: Denies abnormal skin rashes Lymphatics: Denies new lymphadenopathy or easy bruising Neurological:Denies  numbness, tingling or new weaknesses Behavioral/Psych: Mood is stable, no new changes  All other systems were reviewed with the patient and are negative.  I have reviewed the past medical history, past surgical history, social history and family history with the patient and they are unchanged from previous note.  ALLERGIES:  has No Known Allergies.  MEDICATIONS:  Current Outpatient Medications  Medication Sig Dispense Refill  . diltiazem (CARDIZEM CD) 120 MG 24 hr capsule Take 1 capsule (120 mg total) by mouth every morning. 90 capsule 3  . LUMIGAN 0.01 % SOLN Place 1 drop into both eyes daily.    . Magnesium 250 MG TABS Take 1 tablet by mouth daily.    . metoprolol succinate (TOPROL XL) 25 MG 24 hr tablet Take 1 tablet (25 mg total) by  mouth daily. 90 tablet 3  . Multiple Vitamin (MULTIVITAMIN WITH MINERALS) TABS tablet Take 1 tablet by mouth every morning.     . NON FORMULARY Take 10 mg by mouth daily. CBD OIL    . omeprazole (PRILOSEC) 20 MG capsule Take 1 capsule (20 mg total) by mouth daily. 90 capsule 3  . pravastatin (PRAVACHOL) 20 MG tablet Take 1 tablet (20 mg total) by mouth daily. 90 tablet 3  . Probiotic Product (PROBIOTIC DAILY PO) Take 1 capsule by mouth daily.     . tamsulosin (FLOMAX) 0.4 MG CAPS capsule Take 2 capsules (0.8 mg total) by mouth daily after supper. 180 capsule 3   No current facility-administered medications for this visit.    PHYSICAL EXAMINATION: ECOG PERFORMANCE STATUS: 1 - Symptomatic but completely ambulatory  Vitals:   01/26/19 0925  BP: 106/71  Pulse: 65  Resp: 18  Temp: 98.3 F (36.8 C)  SpO2: 96%   Filed Weights   01/26/19 0925  Weight: 187 lb 8 oz (85 kg)    GENERAL:alert, no distress and comfortable SKIN: skin color, texture, turgor are normal, no rashes or significant lesions EYES: normal, Conjunctiva are pink and non-injected, sclera clear OROPHARYNX:no exudate, no erythema and lips, buccal mucosa, and tongue normal  NECK: supple, thyroid normal size, non-tender, without nodularity LYMPH:  no palpable lymphadenopathy in the cervical, axillary or inguinal LUNGS: clear to auscultation and percussion with normal breathing effort HEART: regular rate & rhythm and no murmurs and no lower extremity edema ABDOMEN:abdomen soft, mild tenderness in the epigastric region without rebound and normal bowel sounds Musculoskeletal:no cyanosis of digits and no clubbing  NEURO: alert & oriented x 3 with fluent speech, no focal motor/sensory deficits  LABORATORY DATA:  I have reviewed the data as listed    Component Value Date/Time   NA 140 01/26/2019 0901   NA 140 07/13/2018 1031   NA 139 04/14/2016 0943   K 5.1 01/26/2019 0901   K 4.6 04/14/2016 0943   CL 106 01/26/2019 0901    CL 104 03/24/2012 1024   CO2 26 01/26/2019 0901   CO2 25 04/14/2016 0943   GLUCOSE 103 (H) 01/26/2019 0901   GLUCOSE 111 04/14/2016 0943   GLUCOSE 80 03/24/2012 1024   BUN 15 01/26/2019 0901   BUN 12 07/13/2018 1031   BUN 8.6 04/14/2016 0943   CREATININE 1.17 01/26/2019 0901   CREATININE 1.1 04/14/2016 0943   CALCIUM 9.1 01/26/2019 0901   CALCIUM 9.4 04/14/2016 0943   PROT 7.0 01/26/2019 0901   PROT 6.2 07/13/2018 1031   PROT 6.5 04/14/2016 0943   ALBUMIN 4.2 01/26/2019 0901   ALBUMIN 4.3 07/13/2018 1031   ALBUMIN  3.8 04/14/2016 0943   AST 16 01/26/2019 0901   AST 28 04/14/2016 0943   ALT 17 01/26/2019 0901   ALT 41 04/14/2016 0943   ALKPHOS 68 01/26/2019 0901   ALKPHOS 71 04/14/2016 0943   BILITOT 0.6 01/26/2019 0901   BILITOT 0.6 07/13/2018 1031   BILITOT 0.61 04/14/2016 0943   GFRNONAA >60 01/26/2019 0901   GFRAA >60 01/26/2019 0901    No results found for: SPEP, UPEP  Lab Results  Component Value Date   WBC 6.9 01/26/2019   NEUTROABS 5.0 01/26/2019   HGB 16.5 01/26/2019   HCT 49.1 01/26/2019   MCV 88.8 01/26/2019   PLT 182 01/26/2019      Chemistry      Component Value Date/Time   NA 140 01/26/2019 0901   NA 140 07/13/2018 1031   NA 139 04/14/2016 0943   K 5.1 01/26/2019 0901   K 4.6 04/14/2016 0943   CL 106 01/26/2019 0901   CL 104 03/24/2012 1024   CO2 26 01/26/2019 0901   CO2 25 04/14/2016 0943   BUN 15 01/26/2019 0901   BUN 12 07/13/2018 1031   BUN 8.6 04/14/2016 0943   CREATININE 1.17 01/26/2019 0901   CREATININE 1.1 04/14/2016 0943      Component Value Date/Time   CALCIUM 9.1 01/26/2019 0901   CALCIUM 9.4 04/14/2016 0943   ALKPHOS 68 01/26/2019 0901   ALKPHOS 71 04/14/2016 0943   AST 16 01/26/2019 0901   AST 28 04/14/2016 0943   ALT 17 01/26/2019 0901   ALT 41 04/14/2016 0943   BILITOT 0.6 01/26/2019 0901   BILITOT 0.6 07/13/2018 1031   BILITOT 0.61 04/14/2016 0943

## 2019-01-31 ENCOUNTER — Other Ambulatory Visit: Payer: Self-pay

## 2019-01-31 ENCOUNTER — Ambulatory Visit (HOSPITAL_COMMUNITY)
Admission: RE | Admit: 2019-01-31 | Discharge: 2019-01-31 | Disposition: A | Discharge status: Home / Self Care | Payer: Medicare Other | Source: Ambulatory Visit | Attending: Hematology and Oncology | Admitting: Hematology and Oncology

## 2019-01-31 DIAGNOSIS — K449 Diaphragmatic hernia without obstruction or gangrene: Secondary | ICD-10-CM | POA: Diagnosis not present

## 2019-01-31 DIAGNOSIS — R0609 Other forms of dyspnea: Secondary | ICD-10-CM

## 2019-01-31 DIAGNOSIS — Z8572 Personal history of non-Hodgkin lymphomas: Secondary | ICD-10-CM | POA: Insufficient documentation

## 2019-01-31 DIAGNOSIS — R0600 Dyspnea, unspecified: Secondary | ICD-10-CM | POA: Diagnosis not present

## 2019-01-31 DIAGNOSIS — R1013 Epigastric pain: Secondary | ICD-10-CM | POA: Diagnosis not present

## 2019-01-31 DIAGNOSIS — J439 Emphysema, unspecified: Secondary | ICD-10-CM | POA: Diagnosis not present

## 2019-01-31 DIAGNOSIS — C8590 Non-Hodgkin lymphoma, unspecified, unspecified site: Secondary | ICD-10-CM | POA: Diagnosis not present

## 2019-01-31 IMAGING — CT CT ABD-PELV W/ CM
2 of 5 series · 14 of 36 positions shown, 17 images · IV contrast (OMNIPAQUE)
Comparison: [DATE]

CLINICAL DATA: Followup lymphoma. New onset dyspnea.

EXAM:
CT CHEST, ABDOMEN, AND PELVIS WITH CONTRAST
TECHNIQUE: Multidetector CT imaging of the chest, abdomen and pelvis was
performed following the standard protocol during bolus
administration of intravenous contrast.
CONTRAST:  <See Chart> OMNIPAQUE IOHEXOL 300 MG/ML SOLN, <See Chart>
OMNIPAQUE IOHEXOL 300 MG/ML SOLN

[Series 4: cap with · axial · 0.85mm/px · z∈[+933,+1508]mm · 11 of 139 slices shown, 14 images]
[im 12/139  mediastinal]
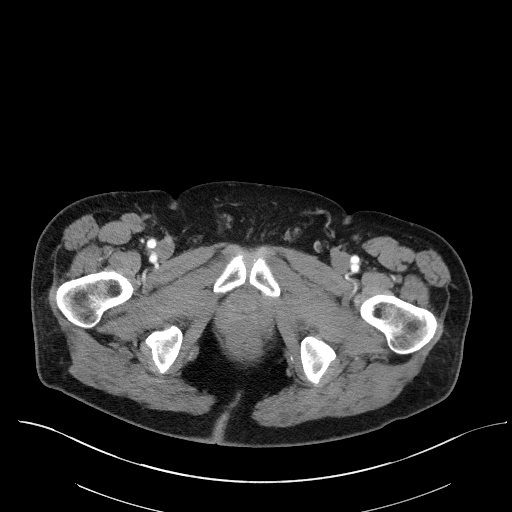
[im 12/139  lung]
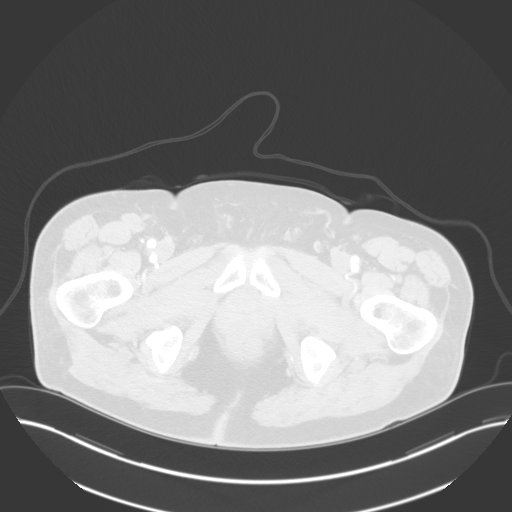
[im 24/139  lung]
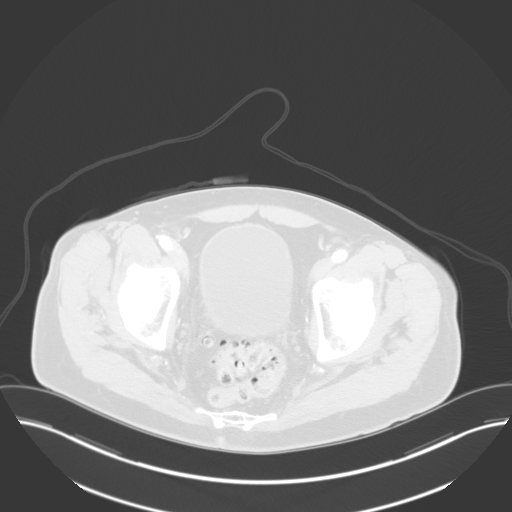
[im 35/139  lung]
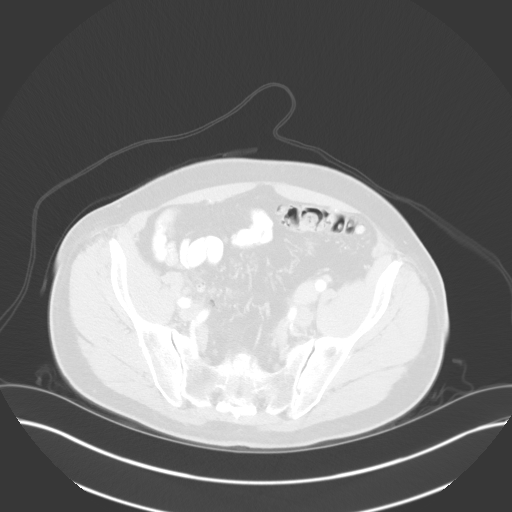
[im 47/139  lung]
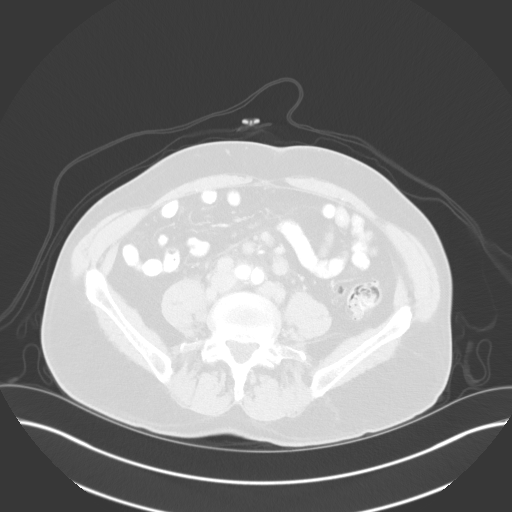
[im 58/139  mediastinal]
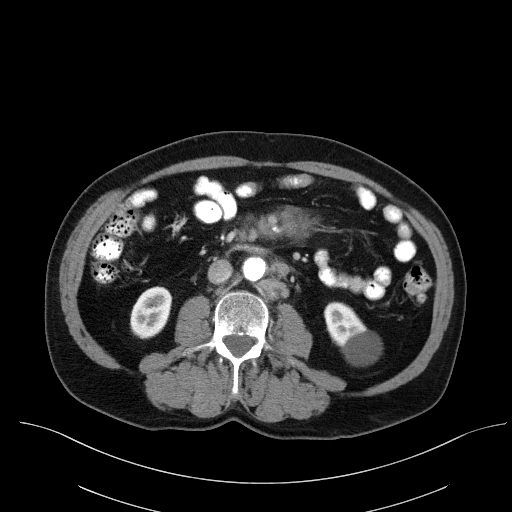
[im 58/139  lung]
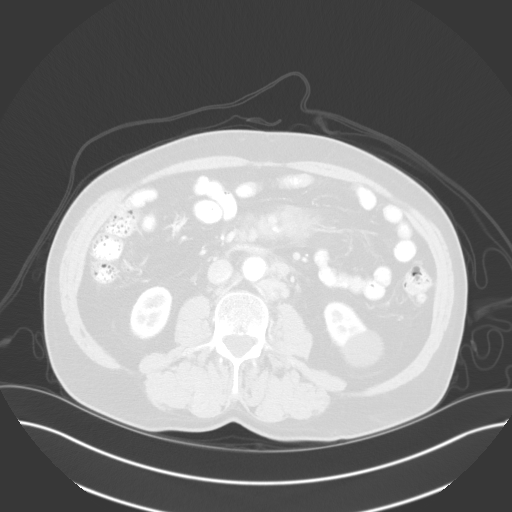
[im 70/139  lung]
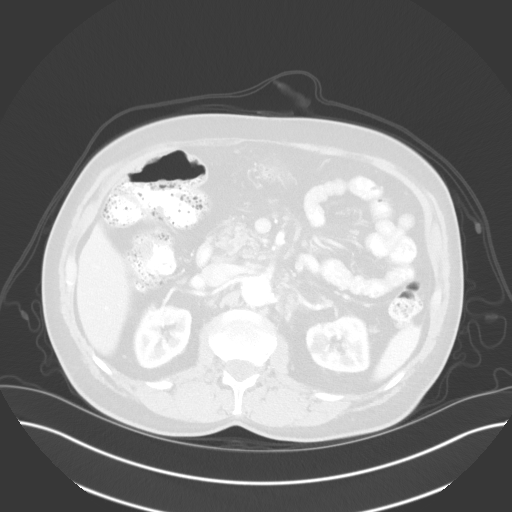
[im 81/139  lung]
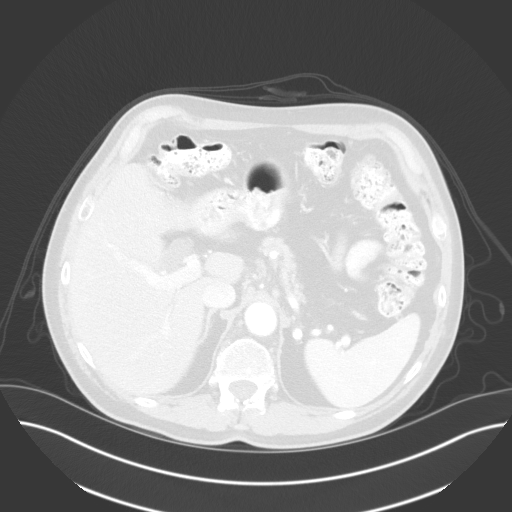
[im 93/139  lung]
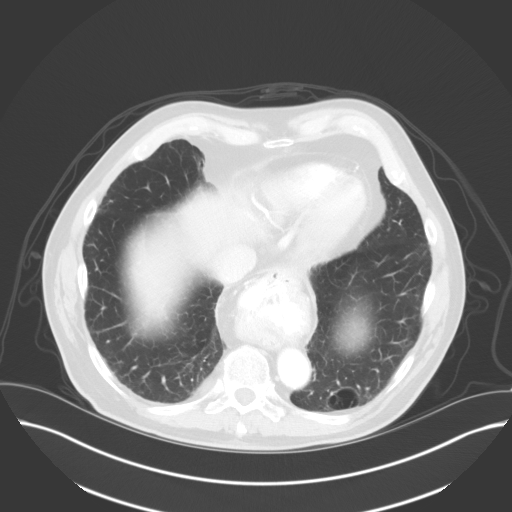
[im 104/139  mediastinal]
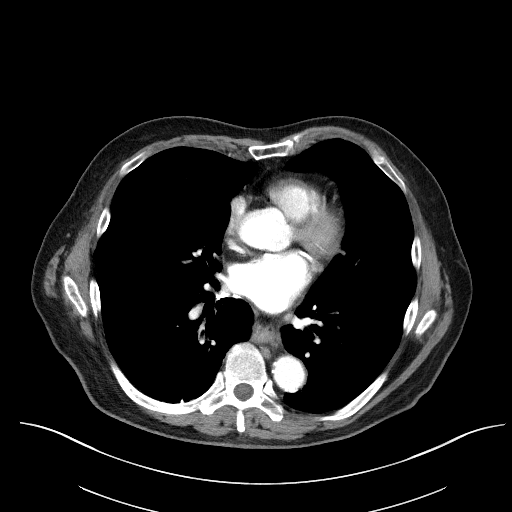
[im 104/139  lung]
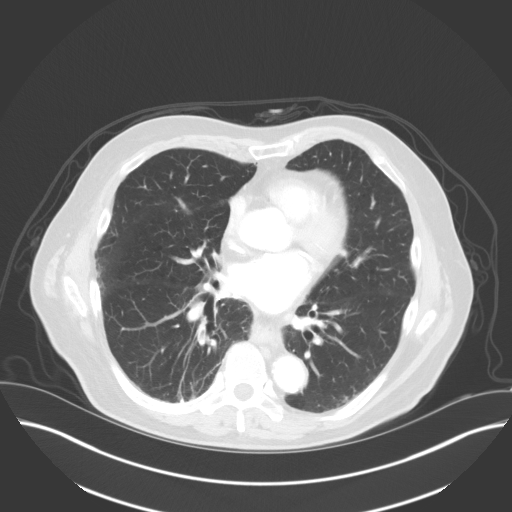
[im 116/139  lung]
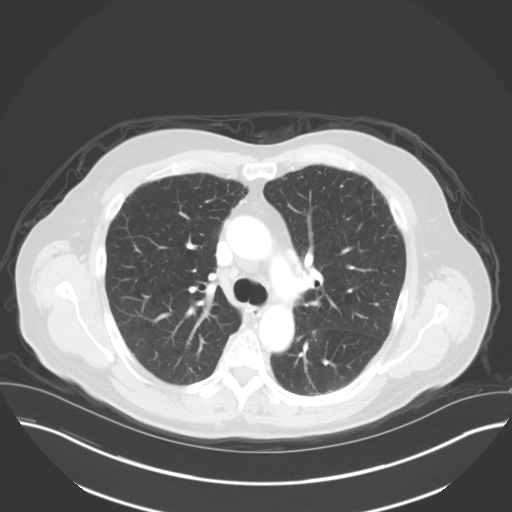
[im 127/139  lung]
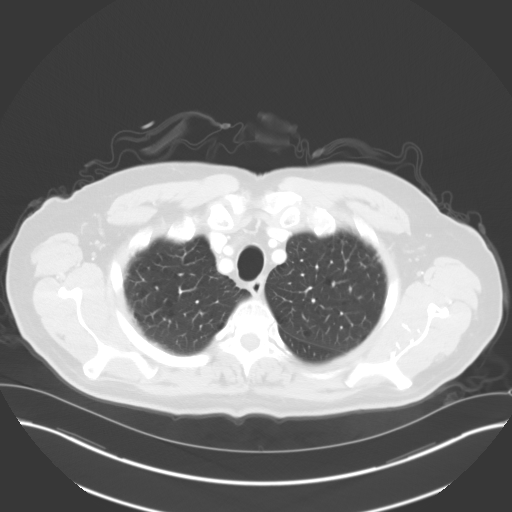

[Series 6: coronals · coronal · 0.98mm/px · 3 of 149 slices shown]
[im 30/149  lung]
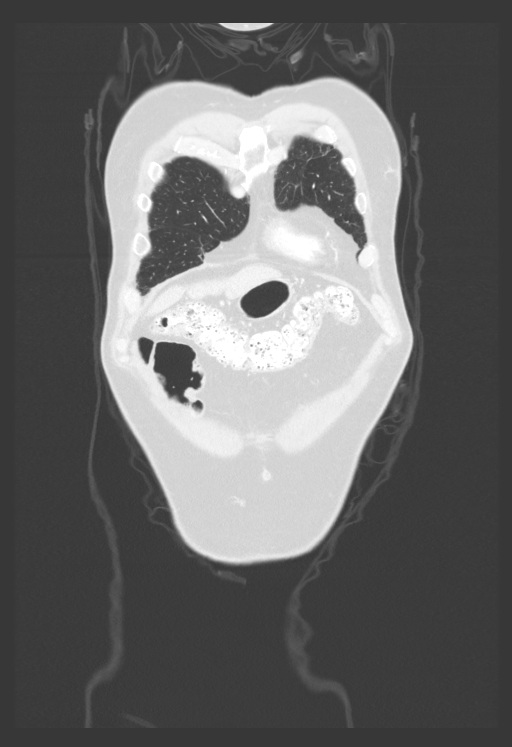
[im 60/149  lung]
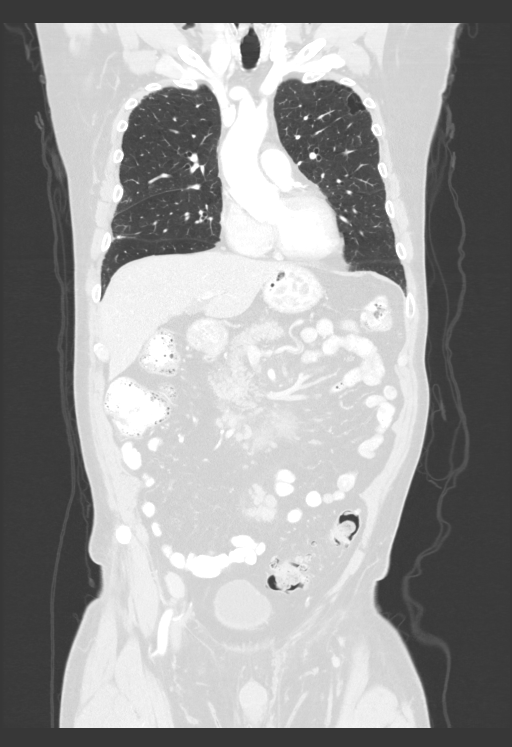
[im 89/149  lung]
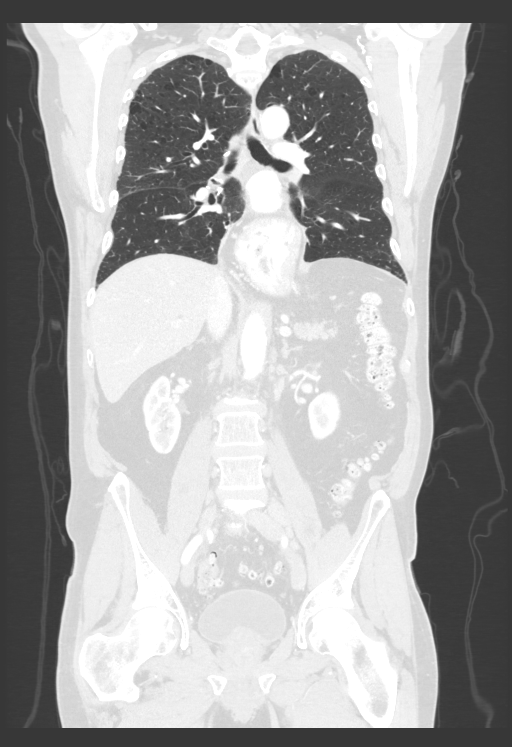

[14 of 36 positions shown; findings below may reference images not displayed]

FINDINGS: CT CHEST FINDINGS

Cardiovascular: The heart size appears within normal limits. Aortic
atherosclerosis. Lad, left circumflex and RCA coronary artery
calcifications.

Mediastinum/Nodes: The trachea appears patent and is midline. Normal
appearance of the thyroid gland. Moderate size hiatal hernia. No
mediastinal, axillary, or supraclavicular adenopathy.

Lungs/Pleura: No pleural effusion. Paraseptal and centrilobular
emphysema. Previous wedge resection from the superior segment right
lower lobe. No suspicious lung nodule

Musculoskeletal: No chest wall mass or suspicious bone lesions
identified.

CT ABDOMEN PELVIS FINDINGS

Hepatobiliary: Stable 1.1 cm left lobe of liver cysts. The no
suspicious liver abnormality identified. Cholecystectomy. Chronic
mild increase caliber of the intrahepatic ducts and common bile
duct. The CBD measures 1.2 cm, image 61/4. Unchanged.

Pancreas: Unremarkable. No pancreatic ductal dilatation or
surrounding inflammatory changes.

Spleen: Normal in size without focal abnormality.

Adrenals/Urinary Tract: Normal appearance of the adrenal glands.
Left kidney cyst measures 3.8 cm, image 80/4. Normal right kidney.
The urinary bladder appears normal.

Stomach/Bowel: Hiatal hernia. The small bowel loops are
nondistended. No wall thickening or inflammation. Extensive distal
colonic diverticulosis without acute inflammation.

Vascular/Lymphatic: Aortic atherosclerosis without aneurysm.
Adenopathy noted within the abdomen and pelvis.

-Mesenteric mass measures 7.8 x 3.0 cm, image 80/4. New from
previous exam.

-Left periaortic node measures 1.3 cm, image 86/4. New from previous
exam.

-Pre caval node measures 1.4 cm, image 91/4. New from previous exam.

-Node just below the aortic bifurcation measures 1.7 cm, image 99/4.
New.

-Left common iliac lymph node measures 2.1 cm, image 97/4.  New.

-Right external iliac lymph node measures 1.7 cm, image 112/4.  New.

Reproductive: Prostate gland enlargement.

Other: No free fluid or fluid collections.

Musculoskeletal: Lumbar degenerative disc disease. Most advanced at
L5-S1.
IMPRESSION: 1. Interval development of abdominal and pelvic adenopathy
compatible with recurrent lymphoma.
2. Emphysema and aortic atherosclerosis.
3. Multi vessel coronary artery calcifications.
4. Moderate to large hiatal hernia

Aortic Atherosclerosis ([KB]-[KB]) and Emphysema ([KB]-[KB]).

## 2019-01-31 IMAGING — CT CT CHEST W/ CM
3 of 5 series · 15 of 37 positions shown, 17 images · IV contrast (OMNIPAQUE)
Comparison: [DATE]

CLINICAL DATA: Followup lymphoma. New onset dyspnea.

EXAM:
CT CHEST, ABDOMEN, AND PELVIS WITH CONTRAST
TECHNIQUE: Multidetector CT imaging of the chest, abdomen and pelvis was
performed following the standard protocol during bolus
administration of intravenous contrast.
CONTRAST:  <See Chart> OMNIPAQUE IOHEXOL 300 MG/ML SOLN, <See Chart>
OMNIPAQUE IOHEXOL 300 MG/ML SOLN

[Series 2: cap with · axial · 0.85mm/px · z∈[+977,+1227]mm · 4 of 139 slices shown (1 of 2)]
[im 20/139  lung]
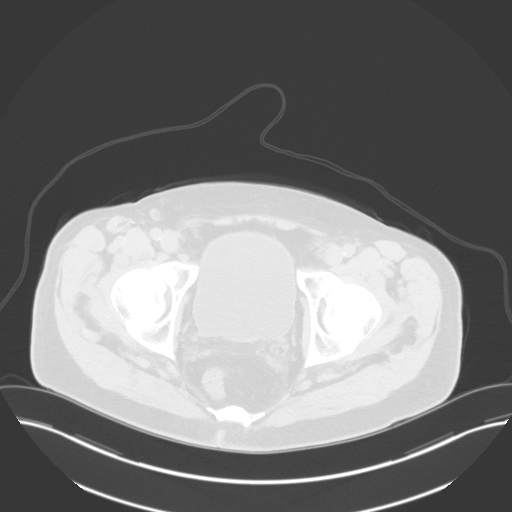
[im 40/139  lung]
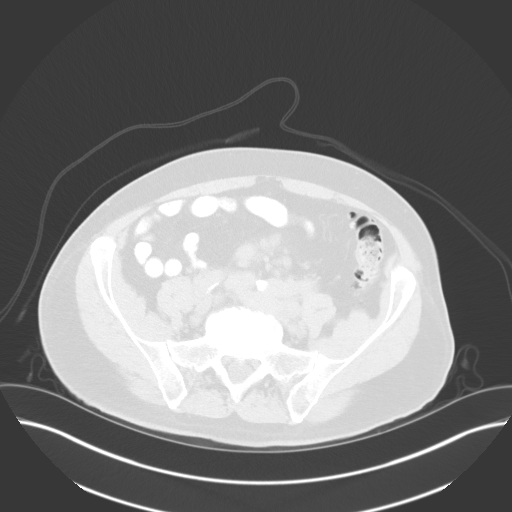
[im 60/139  lung]
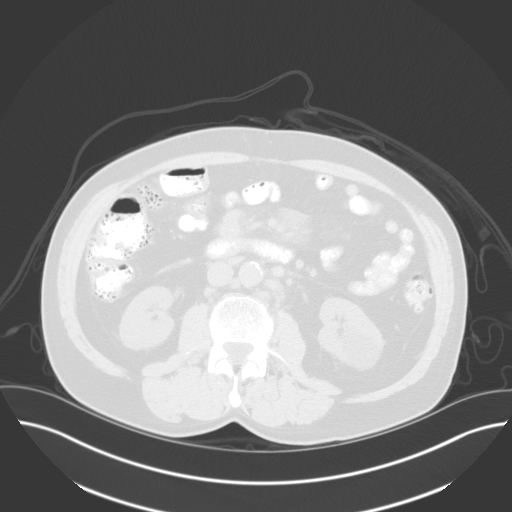
[im 70/139  lung]
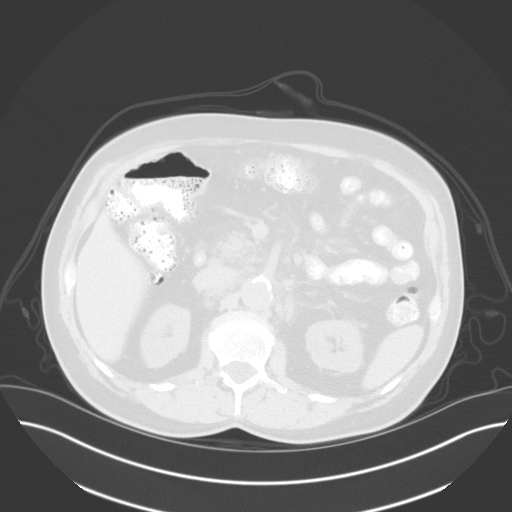

[Series 4: cap with · axial · 0.85mm/px · z∈[+963,+1478]mm · 8 of 139 slices shown, 10 images (2 of 2)]
[im 18/139  mediastinal]
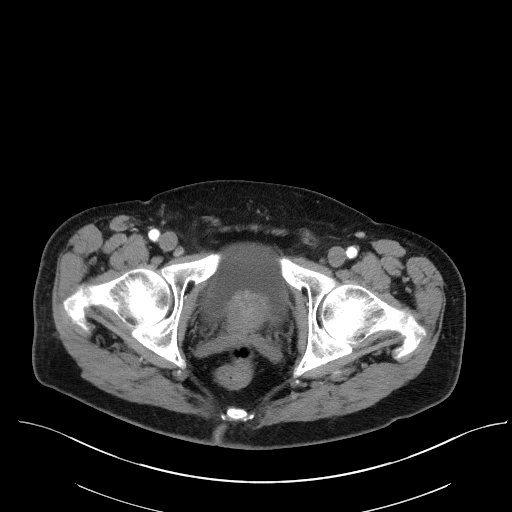
[im 18/139  lung]
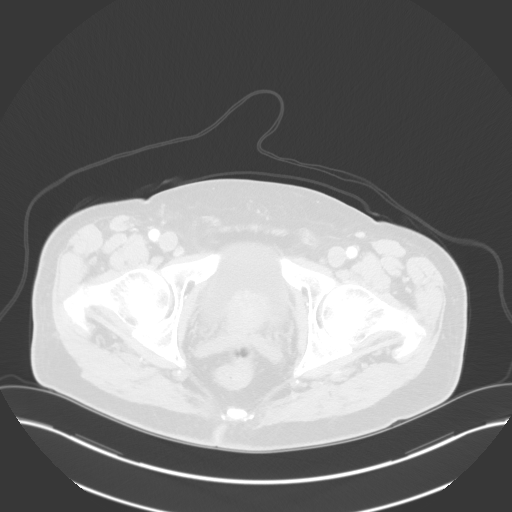
[im 35/139  lung]
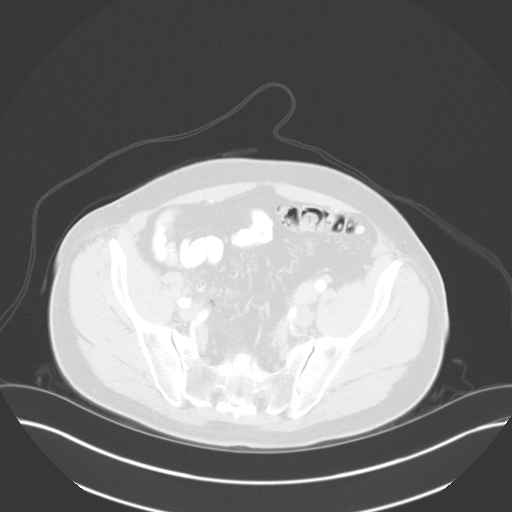
[im 52/139  lung]
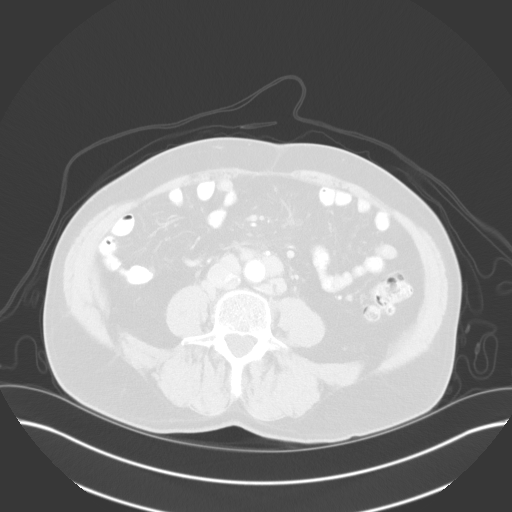
[im 70/139  lung]
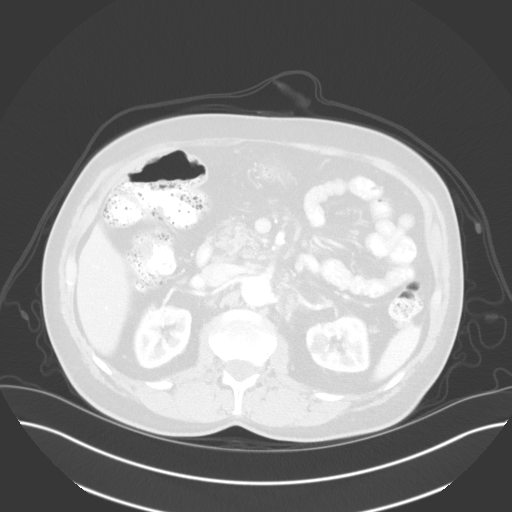
[im 71/139  mediastinal]
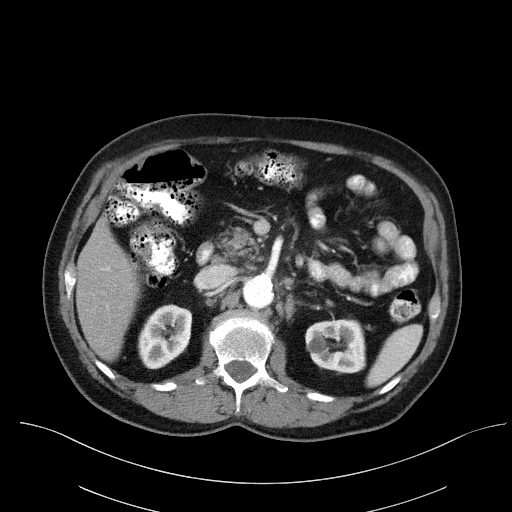
[im 71/139  lung]
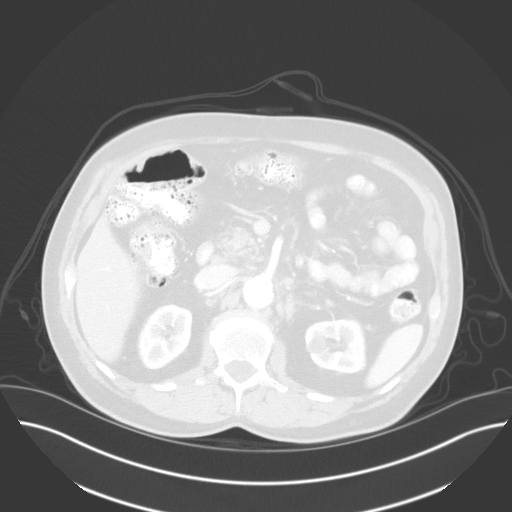
[im 87/139  lung]
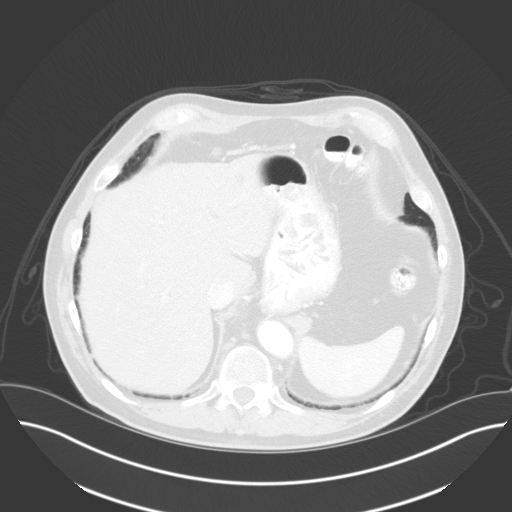
[im 104/139  lung]
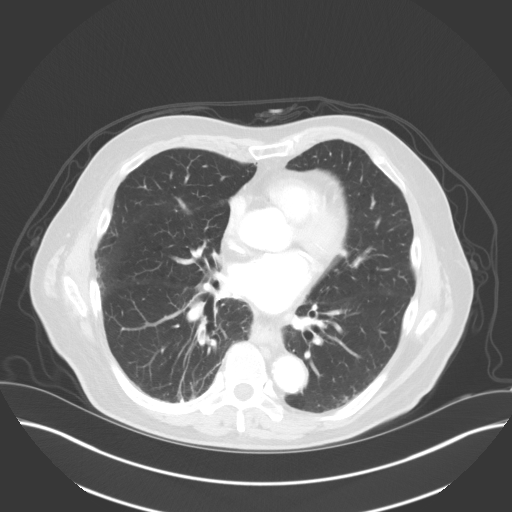
[im 121/139  lung]
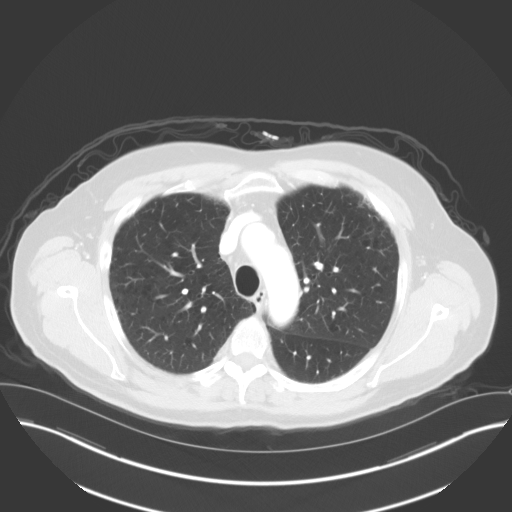

[Series 6: coronals · coronal · 0.98mm/px · 3 of 149 slices shown]
[im 30/149  lung]
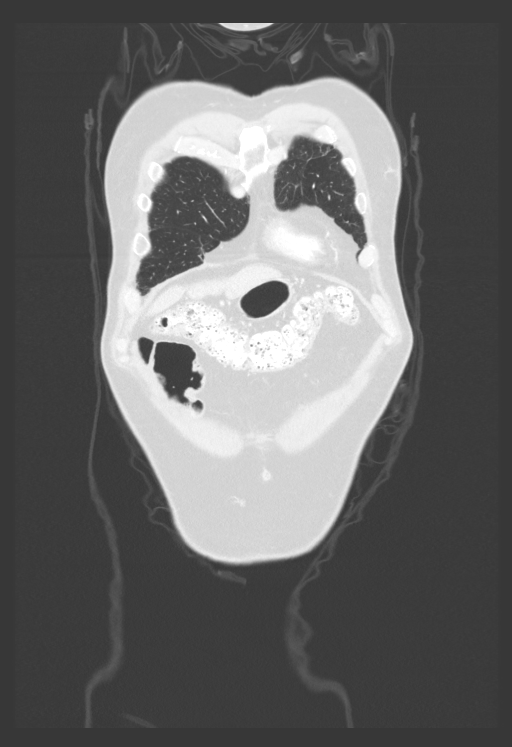
[im 60/149  lung]
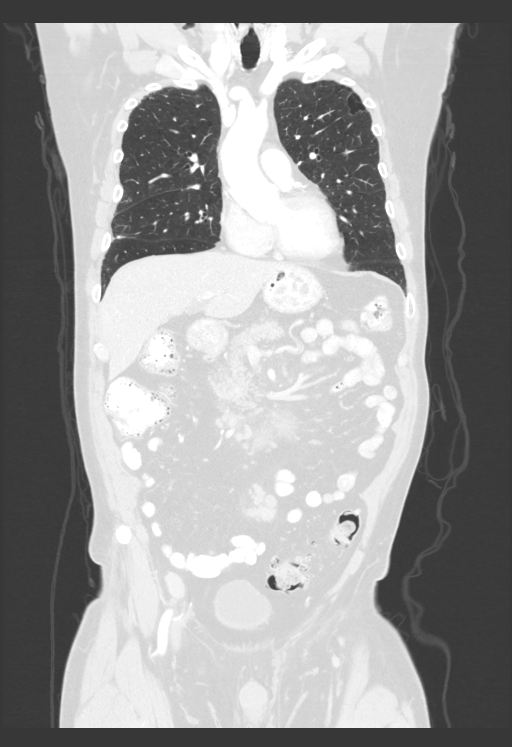
[im 89/149  lung]
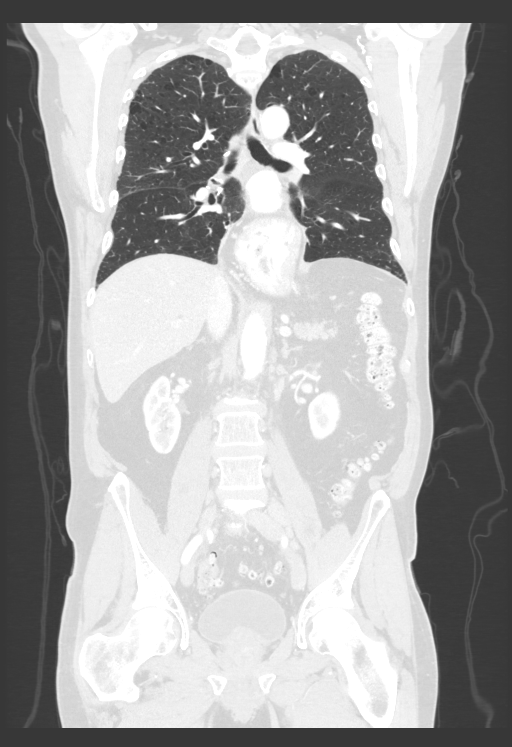

[15 of 37 positions shown; findings below may reference images not displayed]

FINDINGS: CT CHEST FINDINGS

Cardiovascular: The heart size appears within normal limits. Aortic
atherosclerosis. Lad, left circumflex and RCA coronary artery
calcifications.

Mediastinum/Nodes: The trachea appears patent and is midline. Normal
appearance of the thyroid gland. Moderate size hiatal hernia. No
mediastinal, axillary, or supraclavicular adenopathy.

Lungs/Pleura: No pleural effusion. Paraseptal and centrilobular
emphysema. Previous wedge resection from the superior segment right
lower lobe. No suspicious lung nodule

Musculoskeletal: No chest wall mass or suspicious bone lesions
identified.

CT ABDOMEN PELVIS FINDINGS

Hepatobiliary: Stable 1.1 cm left lobe of liver cysts. The no
suspicious liver abnormality identified. Cholecystectomy. Chronic
mild increase caliber of the intrahepatic ducts and common bile
duct. The CBD measures 1.2 cm, image 61/4. Unchanged.

Pancreas: Unremarkable. No pancreatic ductal dilatation or
surrounding inflammatory changes.

Spleen: Normal in size without focal abnormality.

Adrenals/Urinary Tract: Normal appearance of the adrenal glands.
Left kidney cyst measures 3.8 cm, image 80/4. Normal right kidney.
The urinary bladder appears normal.

Stomach/Bowel: Hiatal hernia. The small bowel loops are
nondistended. No wall thickening or inflammation. Extensive distal
colonic diverticulosis without acute inflammation.

Vascular/Lymphatic: Aortic atherosclerosis without aneurysm.
Adenopathy noted within the abdomen and pelvis.

-Mesenteric mass measures 7.8 x 3.0 cm, image 80/4. New from
previous exam.

-Left periaortic node measures 1.3 cm, image 86/4. New from previous
exam.

-Pre caval node measures 1.4 cm, image 91/4. New from previous exam.

-Node just below the aortic bifurcation measures 1.7 cm, image 99/4.
New.

-Left common iliac lymph node measures 2.1 cm, image 97/4.  New.

-Right external iliac lymph node measures 1.7 cm, image 112/4.  New.

Reproductive: Prostate gland enlargement.

Other: No free fluid or fluid collections.

Musculoskeletal: Lumbar degenerative disc disease. Most advanced at
L5-S1.
IMPRESSION: 1. Interval development of abdominal and pelvic adenopathy
compatible with recurrent lymphoma.
2. Emphysema and aortic atherosclerosis.
3. Multi vessel coronary artery calcifications.
4. Moderate to large hiatal hernia

Aortic Atherosclerosis ([KB]-[KB]) and Emphysema ([KB]-[KB]).

## 2019-01-31 MED ORDER — IOHEXOL 300 MG/ML  SOLN: 75.0000 mL | Freq: Once | INTRAMUSCULAR | Status: DC | PRN

## 2019-01-31 MED ORDER — IOHEXOL 300 MG/ML  SOLN
100.0000 mL | Freq: Once | INTRAMUSCULAR | Status: AC | PRN
  Administered 2019-01-31: 15:00:00 100 mL via INTRAVENOUS

## 2019-01-31 MED ORDER — SODIUM CHLORIDE (PF) 0.9 % IJ SOLN
INTRAMUSCULAR | Status: AC
  Filled 2019-01-31: qty 50

## 2019-02-01 ENCOUNTER — Ambulatory Visit: Payer: Medicare Other | Admitting: Family Medicine

## 2019-02-01 ENCOUNTER — Encounter: Payer: Self-pay | Admitting: Hematology and Oncology

## 2019-02-01 ENCOUNTER — Encounter (HOSPITAL_COMMUNITY): Payer: Self-pay | Admitting: Radiology

## 2019-02-01 ENCOUNTER — Inpatient Hospital Stay (HOSPITAL_BASED_OUTPATIENT_CLINIC_OR_DEPARTMENT_OTHER): Payer: Medicare Other | Admitting: Hematology and Oncology

## 2019-02-01 ENCOUNTER — Other Ambulatory Visit: Payer: Self-pay

## 2019-02-01 VITALS — BP 114/72 | HR 66 | Temp 98.2°F | Resp 18 | Ht 74.0 in | Wt 190.2 lb

## 2019-02-01 DIAGNOSIS — R42 Dizziness and giddiness: Secondary | ICD-10-CM | POA: Diagnosis not present

## 2019-02-01 DIAGNOSIS — R1013 Epigastric pain: Secondary | ICD-10-CM | POA: Diagnosis not present

## 2019-02-01 DIAGNOSIS — Z9221 Personal history of antineoplastic chemotherapy: Secondary | ICD-10-CM | POA: Diagnosis not present

## 2019-02-01 DIAGNOSIS — C8510 Unspecified B-cell lymphoma, unspecified site: Secondary | ICD-10-CM | POA: Diagnosis not present

## 2019-02-01 DIAGNOSIS — Z8572 Personal history of non-Hodgkin lymphomas: Secondary | ICD-10-CM | POA: Diagnosis not present

## 2019-02-01 DIAGNOSIS — J439 Emphysema, unspecified: Secondary | ICD-10-CM | POA: Diagnosis not present

## 2019-02-01 DIAGNOSIS — R143 Flatulence: Secondary | ICD-10-CM | POA: Diagnosis not present

## 2019-02-01 NOTE — Progress Notes (Signed)
Axell Trigueros "[name]" Male, 74 y.o., 09/02/1945 MRN:  [id number] Phone:  [phone] [name]) PCP:  Dettinger, [name], MD Primary Cvg:  Medicare/Medicare Part A And B Next Appt With Family Medicine 02/21/2019 at 10:30 AM  RE: [place] Core Biopsy (Lymph Nodes) Received: Today Message Contents  Arne Cleveland, MD  Jillyn Hidden  Ok   CT core L paraaortic LAN  R/o lymphoma   DDH       Previous Messages   ----- Message -----  From: Jillyn Hidden  Sent: 02/01/2019  2:34 PM EST  To: Markus Daft, MD, [place] Procedure Requests  Subject: FW: [place] Core Biopsy (Lymph Nodes)         Provider changed after I placed biopsy in for review:   New Procedure: CT Biopsy   Reason: discussed with Dr. Anselm Pancoast, recommended CT guided biopsy of retroperitoneal LN, suspect recurrent lymphoma, need core if possible, History of B-cell lymphoma   History: CT in computer   Provider: [facility], [place]   Provider contact: [phone]    ----- Message -----  From: Garth Bigness D  Sent: 02/01/2019  1:57 PM EST  To: [place] Procedure Requests  Subject: [place] Core Biopsy (Lymph Nodes)           Procedure: [place] Core Biopsy (lymph nodes)   Reason: recurrent lymphoma, need core biopsy of pelvic lymph nodes seen on CT from 1/20, History of B-cell lymphoma   History: CT in computer   Provider: [facility], [place]   Provider contact: [phone]

## 2019-02-01 NOTE — Assessment & Plan Note (Signed)
I have reviewed multiple imaging studies with the patient and his wife It has been almost 5 years since his last treatment I think it is important to repeat biopsy to make sure there is no malignant transformation to high-grade lymphoma Denies excessive pain in his abdomen I have reviewed the imaging with the interventional radiologist who felt that CT-guided biopsy would be reasonable I will go ahead and order the procedure I will see him back within 2 to 3 days after the biopsy for further discussion about plan of care We discussed briefly about treatment options today

## 2019-02-01 NOTE — Progress Notes (Signed)
[place] OFFICE PROGRESS NOTE  Patient Care Team: Dettinger, [name], [name], [name], [name], [name], [name], MD as Consulting Physician (Dermatology)  ASSESSMENT & PLAN:  History of B-cell lymphoma I have reviewed multiple imaging studies with the patient and his wife It has been almost 5 years since his last treatment I think it is important to repeat biopsy to make sure there is no malignant transformation to high-grade lymphoma Denies excessive pain in his abdomen I have reviewed the imaging with the interventional radiologist who felt that CT-guided biopsy would be reasonable I will go ahead and order the procedure I will see him back within 2 to 3 days after the biopsy for further discussion about plan of care We discussed briefly about treatment options today   Orders Placed This Encounter  Procedures  . CT BIOPSY    Standing Status:   Future    Standing Expiration Date:   03/07/2020    Order Specific Question:   Reason for Exam (SYMPTOM  OR DIAGNOSIS REQUIRED)    Answer:   discussed with Dr. Anselm Pancoast, recommended CT guided biopsy of retroperitoneal LN, suspect recurrent lymphoma, need core if possible    Order Specific Question:   Preferred imaging location?    Answer:   [facility]    All questions were answered. The patient knows to call the clinic with any problems, [name], [name], MD 02/01/2019 2:16 PM  INTERVAL HISTORY: Please see below for problem oriented charting. 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   12/12/2003 - 06/01/2004 Chemotherapy   He was treated with R. CHOP chemotherapy which show complete remission. The number of cycles of R. CHOP chemotherapy was unknown.   12/19/2006 Surgery   Lung resection show follicular lymphoma.   01/02/2007 - 09/01/2008 Chemotherapy   The patient was treated with single agent rituximab alone.   01/19/2007 Bone Marrow Biopsy   Bone marrow biopsy was negative.   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   05/08/2013 - 05/11/2013 Hospital Admission   The patient was admitted to the hospital for management of pericarditis. CT scan showed extensive lymphadenopathy.   06/07/2013 Imaging   PET/CT scan showed extensive lymphadenopathy   06/25/2013 Procedure   He has placement of Infuse-a-Port.   06/28/2013 Bone Marrow Biopsy   Bone marrow biopsy is positive for lymphoma involvement.   07/04/2013 - 11/22/2013 Chemotherapy   He is treated with 6 cycles of bendamustine with rituximab.   09/24/2013 Imaging  Repeat PET scan show complete remission.   12/26/2013 Imaging   PEt scan showed complete remission   09/26/2014 Imaging   CT scan of the chest abdomen and pelvis show no evidence of disease   03/26/2015 Imaging   CT scan showed no evidence of lymphoma   04/14/2016 Imaging   CT: Borderline prominent right hilar and subcarinal lymph nodes, but not  appreciably changed. 2. Low-grade but increased central mesenteric stranding with some small mesenteric lymph nodes. This could certainly be inflammatory, 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, [name], volume estimated at 75 cubic cm.   02/15/2017 Imaging   No evidence of recurrent lymphoma or other acute findings.  Stable moderate hiatal hernia.  Colonic diverticulosis, without radiographic evidence of diverticulitis.  Stable mildly enlarged prostate.  Mild emphysema.  Aortic and coronary artery atherosclerosis.   01/31/2019 Imaging   1. Interval development of abdominal and pelvic adenopathy compatible with recurrent lymphoma. 2. Emphysema and aortic atherosclerosis. 3. Multi vessel coronary artery calcifications. 4. Moderate to large hiatal hernia     REVIEW OF SYSTEMS:   Constitutional: Denies fevers, chills or abnormal weight loss Eyes: Denies blurriness of vision Ears, nose, mouth, throat, and face: Denies mucositis or sore throat Respiratory: Denies cough, dyspnea or wheezes Cardiovascular: Denies palpitation, chest discomfort or lower extremity swelling Gastrointestinal:  Denies nausea, heartburn or change in bowel habits Skin: Denies abnormal skin rashes Lymphatics: Denies new lymphadenopathy or easy bruising Neurological:Denies numbness, tingling or new weaknesses Behavioral/Psych: Mood is stable, no new changes  All other systems were reviewed with the patient and are negative.  I  have reviewed the past medical history, past surgical history, social history and family history with the patient and they are unchanged from previous note.  ALLERGIES:  has No Known Allergies.  MEDICATIONS:  Current Outpatient Medications  Medication Sig Dispense Refill  . diltiazem (CARDIZEM CD) 120 MG 24 hr capsule Take 1 capsule (120 mg total) by mouth every morning. 90 capsule 3  . LUMIGAN 0.01 % SOLN Place 1 drop into both eyes daily.    . Magnesium 250 MG TABS Take 1 tablet by mouth daily.    . metoprolol succinate (TOPROL XL) 25 MG 24 hr tablet Take 1 tablet (25 mg total) by mouth daily. 90 tablet 3  . Multiple Vitamin (MULTIVITAMIN WITH MINERALS) TABS tablet Take 1 tablet by mouth every morning.     . NON FORMULARY Take 10 mg by mouth daily. CBD OIL    . omeprazole (PRILOSEC) 20 MG capsule Take 1 capsule (20 mg total) by mouth daily. 90 capsule 3  . pravastatin (PRAVACHOL) 20 MG tablet Take 1 tablet (20 mg total) by mouth daily. 90 tablet 3  . Probiotic Product (PROBIOTIC DAILY PO) Take 1 capsule by mouth daily.     . tamsulosin (FLOMAX) 0.4 MG CAPS capsule Take 2 capsules (0.8 mg total) by mouth daily after supper. 180 capsule 3   No current facility-administered medications for this visit.    PHYSICAL EXAMINATION: ECOG PERFORMANCE STATUS: 1 - Symptomatic but completely ambulatory  Vitals:   02/01/19 1302  BP: 114/72  Pulse: 66  Resp: 18  Temp: 98.2 F (36.8 C)  SpO2: 98%   Filed Weights   02/01/19 1302  Weight: 190 lb 3.2 oz (86.3 kg)    GENERAL:alert, no distress and comfortable Musculoskeletal:no cyanosis of digits and no clubbing  NEURO: alert & oriented x 3 with fluent speech, no focal motor/sensory deficits  LABORATORY DATA:  I have reviewed the data as listed    Component Value Date/Time   NA 140 01/26/2019 0901   NA 140 07/13/2018 1031   NA 139 04/14/2016 0943   K 5.1 01/26/2019 0901   K 4.6 04/14/2016 0943   CL 106 01/26/2019 0901   CL 104  03/24/2012 1024   CO2 26 01/26/2019 0901   CO2 25 04/14/2016 0943   GLUCOSE 103 (H) 01/26/2019 0901   GLUCOSE 111 04/14/2016 0943   GLUCOSE 80 03/24/2012 1024   BUN 15 01/26/2019 0901   BUN 12 07/13/2018 1031   BUN 8.6 04/14/2016 0943   CREATININE 1.17 01/26/2019 0901   CREATININE 1.1 04/14/2016 0943   CALCIUM 9.1 01/26/2019 0901   CALCIUM 9.4 04/14/2016 0943   PROT 7.0 01/26/2019 0901   PROT 6.2 07/13/2018 1031   PROT 6.5 04/14/2016 0943   ALBUMIN 4.2 01/26/2019 0901   ALBUMIN 4.3 07/13/2018 1031   ALBUMIN 3.8 04/14/2016 0943   AST 16 01/26/2019 0901   AST 28 04/14/2016 0943   ALT 17 01/26/2019 0901   ALT 41 04/14/2016 0943   ALKPHOS 68 01/26/2019 0901   ALKPHOS 71 04/14/2016 0943   BILITOT 0.6 01/26/2019 0901   BILITOT 0.6 07/13/2018 1031   BILITOT 0.61 04/14/2016 0943   GFRNONAA >60 01/26/2019 0901   GFRAA >60 01/26/2019 0901    No results found for: SPEP, UPEP  Lab Results  Component Value Date   WBC 6.9 01/26/2019   NEUTROABS 5.0 01/26/2019   HGB 16.5 01/26/2019   HCT 49.1 01/26/2019   MCV 88.8 01/26/2019   PLT 182 01/26/2019      Chemistry      Component Value Date/Time   NA 140 01/26/2019 0901   NA 140 07/13/2018 1031   NA 139 04/14/2016 0943   K 5.1 01/26/2019 0901   K 4.6 04/14/2016 0943   CL 106 01/26/2019 0901   CL 104 03/24/2012 1024   CO2 26 01/26/2019 0901   CO2 25 04/14/2016 0943   BUN 15 01/26/2019 0901   BUN 12 07/13/2018 1031   BUN 8.6 04/14/2016 0943   CREATININE 1.17 01/26/2019 0901   CREATININE 1.1 04/14/2016 0943      Component Value Date/Time   CALCIUM 9.1 01/26/2019 0901   CALCIUM 9.4 04/14/2016 0943   ALKPHOS 68 01/26/2019 0901   ALKPHOS 71 04/14/2016 0943   AST 16 01/26/2019 0901   AST 28 04/14/2016 0943   ALT 17 01/26/2019 0901   ALT 41 04/14/2016 0943   BILITOT 0.6 01/26/2019 0901   BILITOT 0.6 07/13/2018 1031   BILITOT 0.61 04/14/2016 0943       RADIOGRAPHIC STUDIES: I have reviewed multiple imaging studies  with the patient and his wife I have personally reviewed the radiological images as listed and agreed with the findings in the report. CT Chest W Contrast  Result Date: 01/31/2019 CLINICAL DATA:  Followup lymphoma. New onset dyspnea. EXAM: CT CHEST, ABDOMEN, AND PELVIS WITH CONTRAST TECHNIQUE: Multidetector CT imaging of the chest, abdomen and pelvis was performed following the standard protocol during bolus administration of intravenous contrast. CONTRAST:  <  See Chart> OMNIPAQUE IOHEXOL 300 MG/ML SOLN, <See Chart> OMNIPAQUE IOHEXOL 300 MG/ML SOLN COMPARISON:  02/15/2017 FINDINGS: CT CHEST FINDINGS Cardiovascular: The heart size appears within normal limits. Aortic atherosclerosis. Lad, left circumflex and RCA coronary artery calcifications. Mediastinum/Nodes: The trachea appears patent and is midline. Normal appearance of the thyroid gland. Moderate size hiatal hernia. No mediastinal, axillary, or supraclavicular adenopathy. Lungs/Pleura: No pleural effusion. Paraseptal and centrilobular emphysema. Previous wedge resection from the superior segment right lower lobe. No suspicious lung nodule Musculoskeletal: No chest wall mass or suspicious bone lesions identified. CT ABDOMEN PELVIS FINDINGS Hepatobiliary: Stable 1.1 cm left lobe of liver cysts. The no suspicious liver abnormality identified. Cholecystectomy. Chronic mild increase caliber of the intrahepatic ducts and common bile duct. The CBD measures 1.2 cm, image 61/4. Unchanged. Pancreas: Unremarkable. No pancreatic ductal dilatation or surrounding inflammatory changes. Spleen: Normal in size without focal abnormality. Adrenals/Urinary Tract: Normal appearance of the adrenal glands. Left kidney cyst measures 3.8 cm, image 80/4. Normal right kidney. The urinary bladder appears normal. Stomach/Bowel: Hiatal hernia. The small bowel loops are nondistended. No wall thickening or inflammation. Extensive distal colonic diverticulosis without acute inflammation.  Vascular/Lymphatic: Aortic atherosclerosis without aneurysm. Adenopathy noted within the abdomen and pelvis. -Mesenteric mass measures 7.8 x 3.0 cm, image 80/4. New from previous exam. -Left periaortic node measures 1.3 cm, image 86/4. New from previous exam. -Pre caval node measures 1.4 cm, image 91/4. New from previous exam. -Node just below the aortic bifurcation measures 1.7 cm, image 99/4. New. -Left common iliac lymph node measures 2.1 cm, image 97/4.  New. -Right external iliac lymph node measures 1.7 cm, image 112/4.  New. Reproductive: Prostate gland enlargement. Other: No free fluid or fluid collections. Musculoskeletal: Lumbar degenerative disc disease. Most advanced at L5-S1. IMPRESSION: 1. Interval development of abdominal and pelvic adenopathy compatible with recurrent lymphoma. 2. Emphysema and aortic atherosclerosis. 3. Multi vessel coronary artery calcifications. 4. Moderate to large hiatal hernia Aortic Atherosclerosis (ICD10-I70.0) and Emphysema (ICD10-J43.9). Electronically Signed   By: Kerby Moors M.D.   On: 01/31/2019 15:39   CT Abdomen Pelvis W Contrast  Result Date: 01/31/2019 CLINICAL DATA:  Followup lymphoma. New onset dyspnea. EXAM: CT CHEST, ABDOMEN, AND PELVIS WITH CONTRAST TECHNIQUE: Multidetector CT imaging of the chest, abdomen and pelvis was performed following the standard protocol during bolus administration of intravenous contrast. CONTRAST:  <See Chart> OMNIPAQUE IOHEXOL 300 MG/ML SOLN, <See Chart> OMNIPAQUE IOHEXOL 300 MG/ML SOLN COMPARISON:  02/15/2017 FINDINGS: CT CHEST FINDINGS Cardiovascular: The heart size appears within normal limits. Aortic atherosclerosis. Lad, left circumflex and RCA coronary artery calcifications. Mediastinum/Nodes: The trachea appears patent and is midline. Normal appearance of the thyroid gland. Moderate size hiatal hernia. No mediastinal, axillary, or supraclavicular adenopathy. Lungs/Pleura: No pleural effusion. Paraseptal and centrilobular  emphysema. Previous wedge resection from the superior segment right lower lobe. No suspicious lung nodule Musculoskeletal: No chest wall mass or suspicious bone lesions identified. CT ABDOMEN PELVIS FINDINGS Hepatobiliary: Stable 1.1 cm left lobe of liver cysts. The no suspicious liver abnormality identified. Cholecystectomy. Chronic mild increase caliber of the intrahepatic ducts and common bile duct. The CBD measures 1.2 cm, image 61/4. Unchanged. Pancreas: Unremarkable. No pancreatic ductal dilatation or surrounding inflammatory changes. Spleen: Normal in size without focal abnormality. Adrenals/Urinary Tract: Normal appearance of the adrenal glands. Left kidney cyst measures 3.8 cm, image 80/4. Normal right kidney. The urinary bladder appears normal. Stomach/Bowel: Hiatal hernia. The small bowel loops are nondistended. No wall thickening or inflammation. Extensive distal colonic diverticulosis without  acute inflammation. Vascular/Lymphatic: Aortic atherosclerosis without aneurysm. Adenopathy noted within the abdomen and pelvis. -Mesenteric mass measures 7.8 x 3.0 cm, image 80/4. New from previous exam. -Left periaortic node measures 1.3 cm, image 86/4. New from previous exam. -Pre caval node measures 1.4 cm, image 91/4. New from previous exam. -Node just below the aortic bifurcation measures 1.7 cm, image 99/4. New. -Left common iliac lymph node measures 2.1 cm, image 97/4.  New. -Right external iliac lymph node measures 1.7 cm, image 112/4.  New. Reproductive: Prostate gland enlargement. Other: No free fluid or fluid collections. Musculoskeletal: Lumbar degenerative disc disease. Most advanced at L5-S1. IMPRESSION: 1. Interval development of abdominal and pelvic adenopathy compatible with recurrent lymphoma. 2. Emphysema and aortic atherosclerosis. 3. Multi vessel coronary artery calcifications. 4. Moderate to large hiatal hernia Aortic Atherosclerosis (ICD10-I70.0) and Emphysema (ICD10-J43.9). Electronically  Signed   By: Kerby Moors M.D.   On: 01/31/2019 15:39

## 2019-02-01 NOTE — Progress Notes (Signed)
TL 2  Anthony Kelly [address] Male, 74 y.o., 1945-12-29 MRN:  [id number] Phone:  [phone] [name]) PCP:  Dettinger, [name], MD Primary Cvg:  Medicare/Medicare Part A And B Next Appt With Radiology ([facility]-CT 1) 02/09/2019 at 9:00 AM  RE: [place] Core Biopsy (Lymph Nodes) Received: Today Message Contents  Markus Daft, MD  Arlyn Leak for CT guided core biopsy of left periaortic node.   [place]       Previous Messages   ----- Message -----  From: Garth Bigness D  Sent: 02/01/2019  2:34 PM EST  To: Markus Daft, MD, Ir Procedure Requests  Subject: FW: [place] Core Biopsy (Lymph Nodes)         Provider changed after I placed biopsy in for review:   New Procedure: CT Biopsy   Reason: discussed with Dr. Anselm Pancoast, recommended CT guided biopsy of retroperitoneal LN, suspect recurrent lymphoma, need core if possible, History of B-cell lymphoma   History: CT in computer   Provider: [facility], [place]   Provider contact: [phone]    ----- Message -----  From: Garth Bigness D  Sent: 02/01/2019  1:57 PM EST  To: Ir Procedure Requests  Subject: [place] Core Biopsy (Lymph Nodes)           Procedure: [place] Core Biopsy (lymph nodes)   Reason: recurrent lymphoma, need core biopsy of pelvic lymph nodes seen on CT from 1/20, History of B-cell lymphoma   History: CT in computer   Provider: [facility], [place]   Provider contact: [phone]

## 2019-02-02 ENCOUNTER — Telehealth: Payer: Self-pay | Admitting: *Deleted

## 2019-02-02 NOTE — Telephone Encounter (Signed)
-----   Message from [name], MD sent at 02/01/2019  2:17 PM EST ----- Regarding: Ct guided biopsy I spoke with IR He suggested CT guided biopsy of LN on his back rather than the pelvic LN Please call his wife to let her know there is slight change in plan

## 2019-02-02 NOTE — Telephone Encounter (Signed)
Discussed message below with patient and wife. They both verbalized an understanding. Patient has decided to hold off on getting the COVID vaccine at this time so he does not have to worry about delaying treatment. Patient lives in [place] and they have not been given information about where or how is administering the vaccine near them.

## 2019-02-08 ENCOUNTER — Other Ambulatory Visit: Payer: Self-pay | Admitting: Radiology

## 2019-02-08 ENCOUNTER — Telehealth: Payer: Self-pay | Admitting: Hematology and Oncology

## 2019-02-08 ENCOUNTER — Other Ambulatory Visit: Payer: Self-pay | Admitting: Student

## 2019-02-08 DIAGNOSIS — Z9889 Other specified postprocedural states: Secondary | ICD-10-CM | POA: Diagnosis not present

## 2019-02-08 DIAGNOSIS — H353131 Nonexudative age-related macular degeneration, bilateral, early dry stage: Secondary | ICD-10-CM | POA: Diagnosis not present

## 2019-02-08 DIAGNOSIS — H401131 Primary open-angle glaucoma, bilateral, mild stage: Secondary | ICD-10-CM | POA: Diagnosis not present

## 2019-02-08 DIAGNOSIS — H524 Presbyopia: Secondary | ICD-10-CM | POA: Diagnosis not present

## 2019-02-08 NOTE — Telephone Encounter (Signed)
I talk with patient regarding schedule  

## 2019-02-09 ENCOUNTER — Ambulatory Visit (HOSPITAL_COMMUNITY)
Admission: RE | Admit: 2019-02-09 | Discharge: 2019-02-09 | Disposition: A | Discharge status: Home / Self Care | Payer: Medicare Other | Source: Ambulatory Visit | Attending: Hematology and Oncology | Admitting: Hematology and Oncology

## 2019-02-09 ENCOUNTER — Other Ambulatory Visit: Payer: Self-pay

## 2019-02-09 ENCOUNTER — Encounter (HOSPITAL_COMMUNITY): Payer: Self-pay

## 2019-02-09 DIAGNOSIS — Z902 Acquired absence of lung [part of]: Secondary | ICD-10-CM | POA: Diagnosis not present

## 2019-02-09 DIAGNOSIS — I471 Supraventricular tachycardia: Secondary | ICD-10-CM | POA: Diagnosis not present

## 2019-02-09 DIAGNOSIS — H269 Unspecified cataract: Secondary | ICD-10-CM | POA: Insufficient documentation

## 2019-02-09 DIAGNOSIS — N400 Enlarged prostate without lower urinary tract symptoms: Secondary | ICD-10-CM | POA: Insufficient documentation

## 2019-02-09 DIAGNOSIS — Z9049 Acquired absence of other specified parts of digestive tract: Secondary | ICD-10-CM | POA: Diagnosis not present

## 2019-02-09 DIAGNOSIS — I4891 Unspecified atrial fibrillation: Secondary | ICD-10-CM | POA: Diagnosis not present

## 2019-02-09 DIAGNOSIS — I2510 Atherosclerotic heart disease of native coronary artery without angina pectoris: Secondary | ICD-10-CM | POA: Insufficient documentation

## 2019-02-09 DIAGNOSIS — C7800 Secondary malignant neoplasm of unspecified lung: Secondary | ICD-10-CM | POA: Insufficient documentation

## 2019-02-09 DIAGNOSIS — R590 Localized enlarged lymph nodes: Secondary | ICD-10-CM | POA: Diagnosis not present

## 2019-02-09 DIAGNOSIS — Z79899 Other long term (current) drug therapy: Secondary | ICD-10-CM | POA: Insufficient documentation

## 2019-02-09 DIAGNOSIS — Z8572 Personal history of non-Hodgkin lymphomas: Secondary | ICD-10-CM | POA: Diagnosis not present

## 2019-02-09 DIAGNOSIS — C779 Secondary and unspecified malignant neoplasm of lymph node, unspecified: Secondary | ICD-10-CM | POA: Insufficient documentation

## 2019-02-09 DIAGNOSIS — C8290 Follicular lymphoma, unspecified, unspecified site: Secondary | ICD-10-CM | POA: Diagnosis not present

## 2019-02-09 DIAGNOSIS — C8590 Non-Hodgkin lymphoma, unspecified, unspecified site: Secondary | ICD-10-CM | POA: Diagnosis not present

## 2019-02-09 DIAGNOSIS — E781 Pure hyperglyceridemia: Secondary | ICD-10-CM | POA: Insufficient documentation

## 2019-02-09 LAB — PROTIME-INR
INR: 1.1 (ref 0.8–1.2)
Prothrombin Time: 14.1 seconds (ref 11.4–15.2)

## 2019-02-09 LAB — CBC
HCT: 45.5 % (ref 39.0–52.0)
Hemoglobin: 15.2 g/dL (ref 13.0–17.0)
MCH: 30.1 pg (ref 26.0–34.0)
MCHC: 33.4 g/dL (ref 30.0–36.0)
MCV: 90.1 fL (ref 80.0–100.0)
Platelets: 174 10*3/uL (ref 150–400)
RBC: 5.05 MIL/uL (ref 4.22–5.81)
RDW: 13.1 % (ref 11.5–15.5)
WBC: 5.2 10*3/uL (ref 4.0–10.5)
nRBC: 0 % (ref 0.0–0.2)

## 2019-02-09 IMAGING — CT CT BIOPSY
1 of 2 series · 15 of 32 positions shown, 19 images · non-contrast
Comparison: none

INDICATION: History of B-cell lymphoma, recurrent retroperitoneal periaortic
adenopathy

[Series 2: i-spiral 5.0 b31f · axial · 0.98mm/px · z∈[+1424,+1602]mm · 15 of 57 slices shown, 19 images]
[im 3/57  soft-tissue]
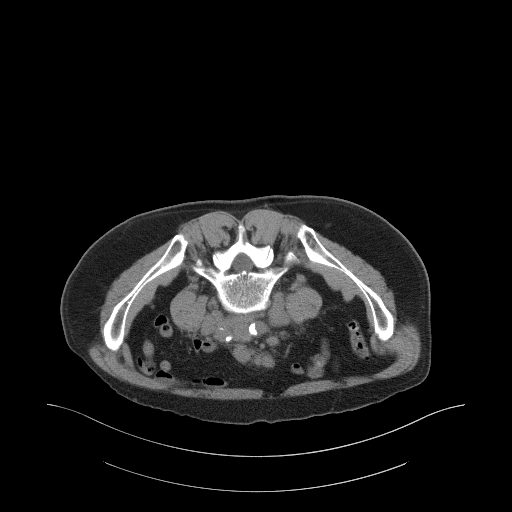
[im 3/57  bone]
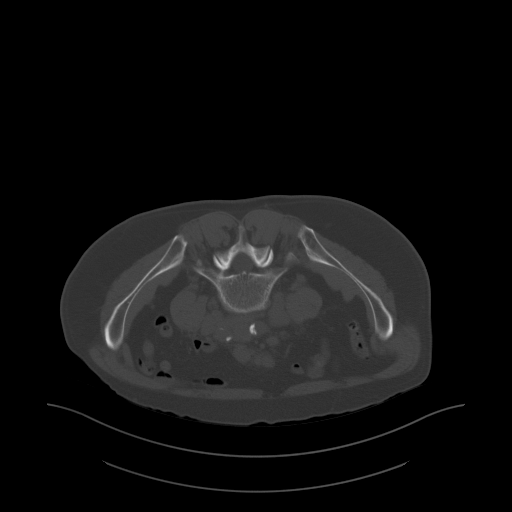
[im 8/57  soft-tissue]
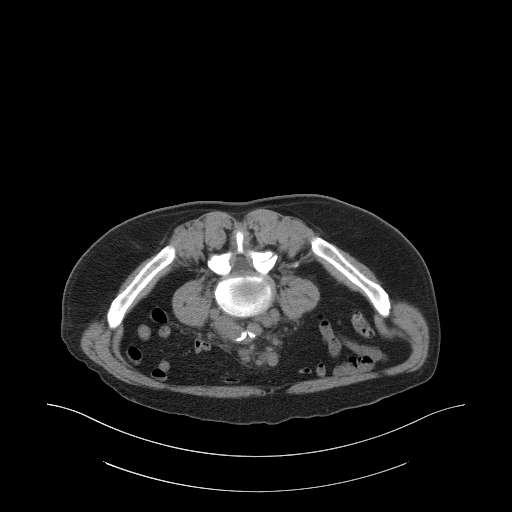
[im 13/57  soft-tissue]
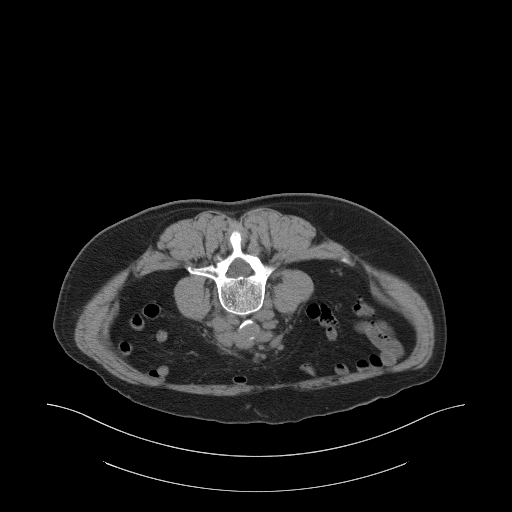
[im 16/57  soft-tissue]
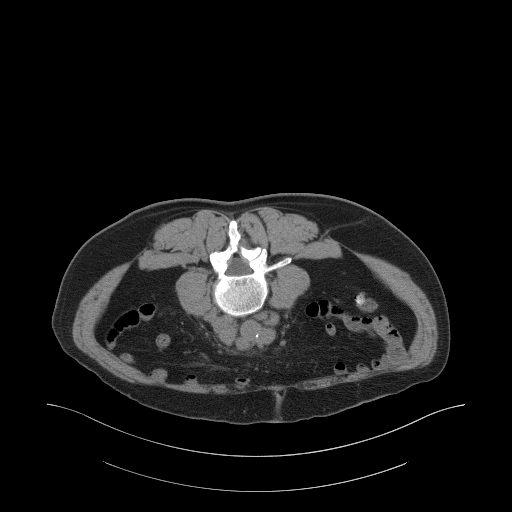
[im 21/57  soft-tissue]
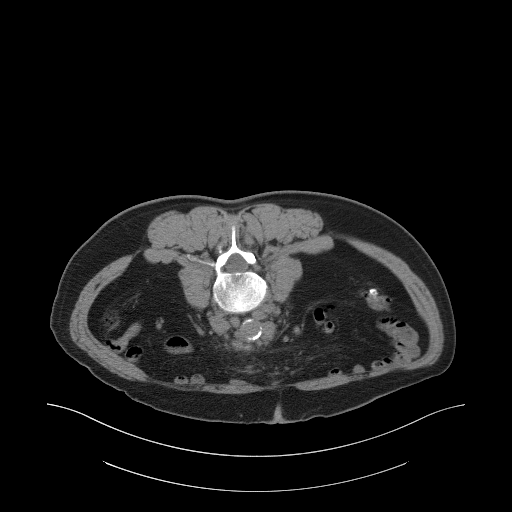
[im 23/57  soft-tissue]
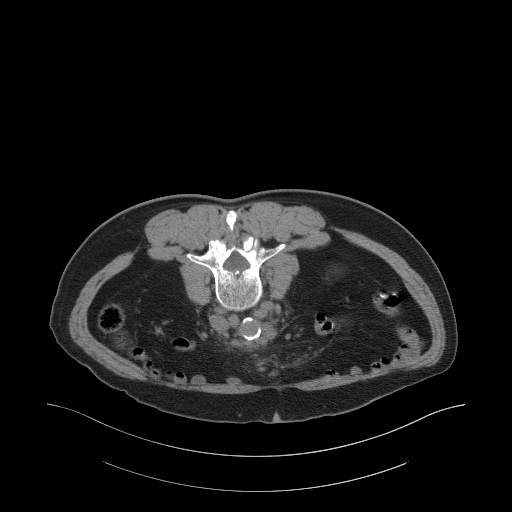
[im 29/57  soft-tissue]
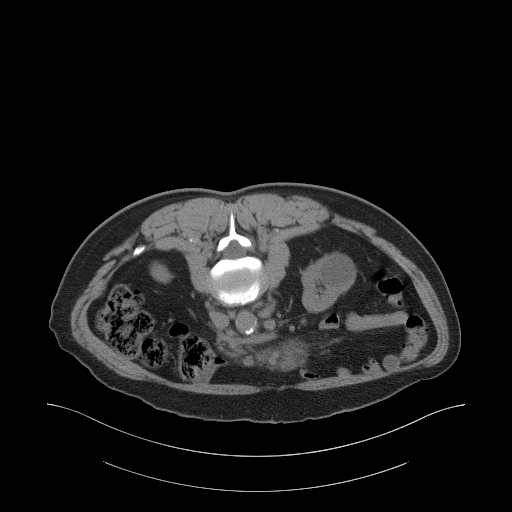
[im 34/57  soft-tissue]
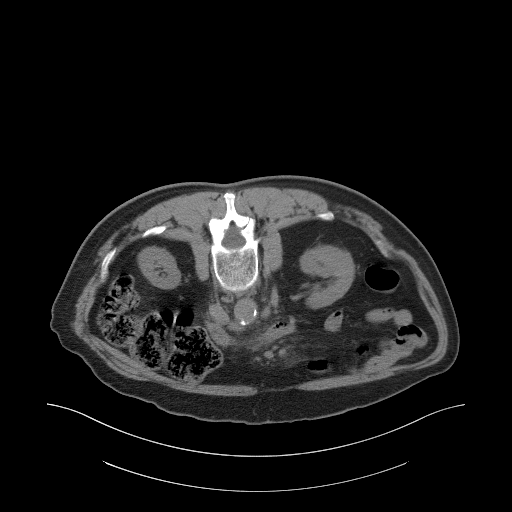
[im 36/57  soft-tissue]
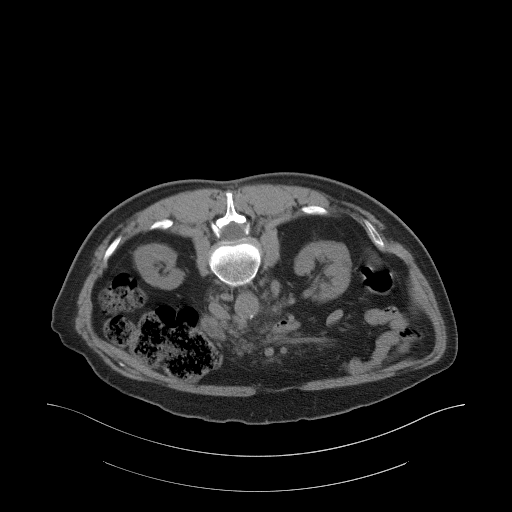
[im 36/57  bone]
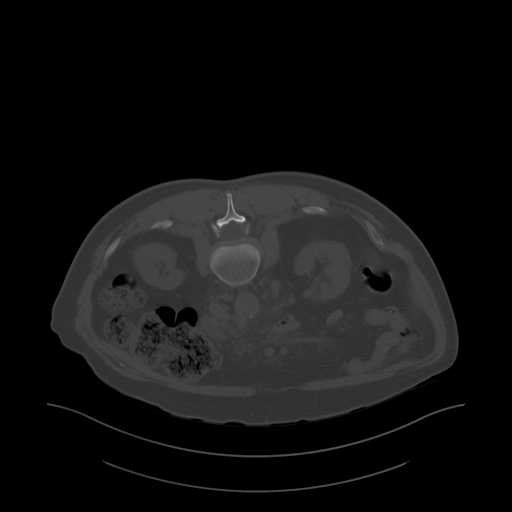
[im 41/57  soft-tissue]
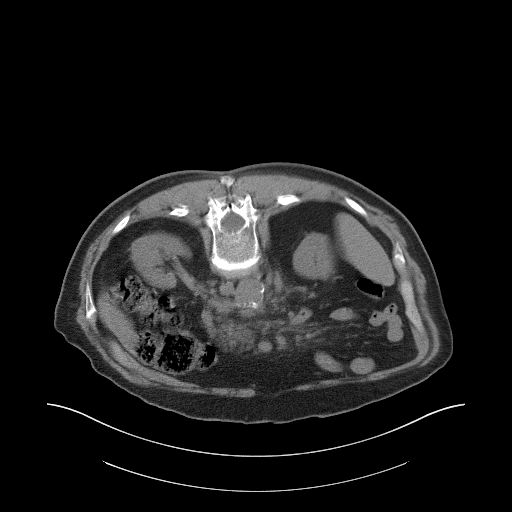
[im 44/57  soft-tissue]
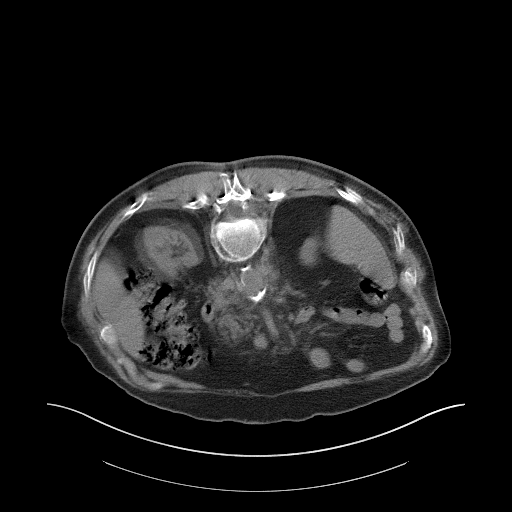
[im 46/57  lung]
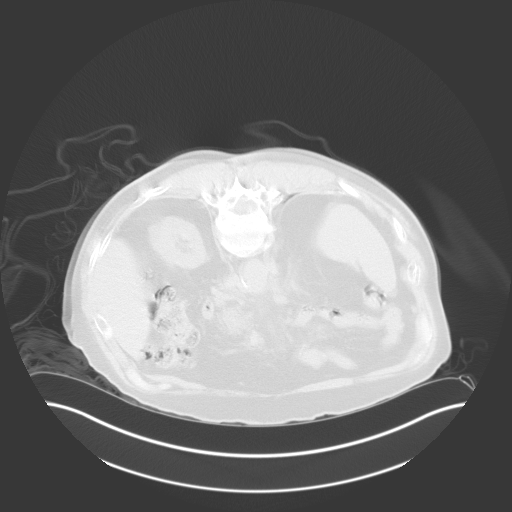
[im 49/57  soft-tissue]
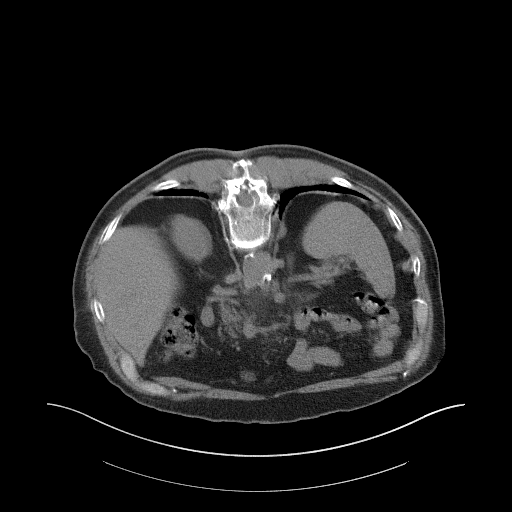
[im 49/57  lung]
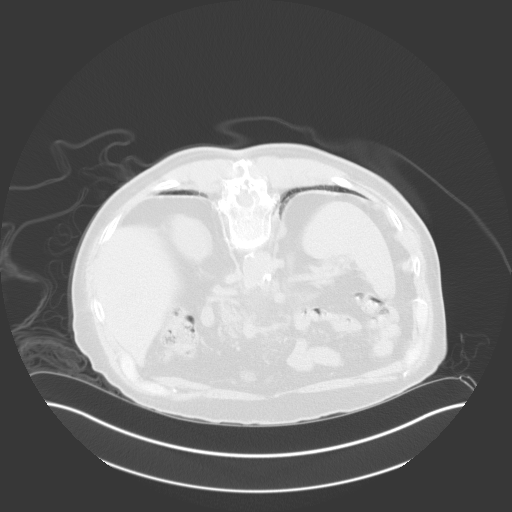
[im 51/57  lung]
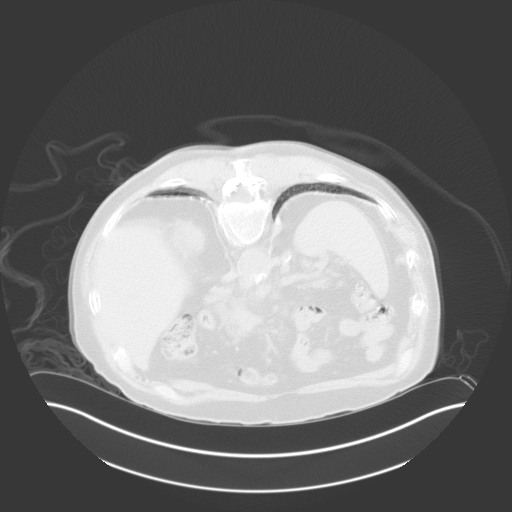
[im 54/57  soft-tissue]
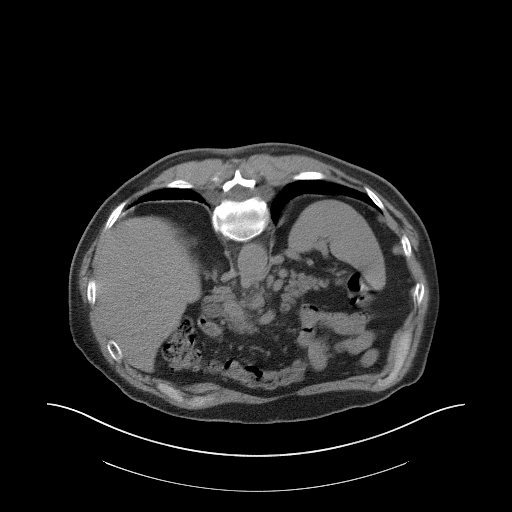
[im 54/57  lung]
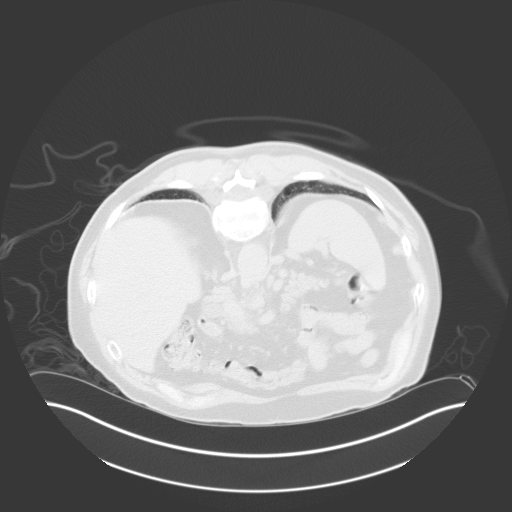

[15 of 32 positions shown; findings below may reference images not displayed]

EXAM:
CT-GUIDED BIOPSY LEFT RETROPERITONEAL PERIAORTIC ADENOPATHY

MEDICATIONS:
1% LIDOCAINE LOCAL

ANESTHESIA/SEDATION:
2.0 mg IV Versed; 100 mcg IV Fentanyl

Moderate Sedation Time:  12 MINUTES

The patient was continuously monitored during the procedure by the
interventional radiology nurse under my direct supervision.

PROCEDURE:
The procedure, risks, benefits, and alternatives were explained to
the patient. Questions regarding the procedure were encouraged and
answered. The patient understands and consents to the procedure.

Previous imaging reviewed. Patient position prone. Noncontrast
localization CT performed. The left periaortic adenopathy was
localized and marked for a posterolateral approach.

Under sterile conditions and local anesthesia, a 17 gauge 16.8 cm
access was advanced to the left periaortic adenopathy. Needle
position confirmed with CT. 1 cm 18 gauge core biopsies obtained.
Samples were placed in saline. Needle tract occluded with Gel-Foam.

Patient tolerated the procedure well without complication. Vital
sign monitoring by nursing staff during the procedure will continue
as patient is in the special procedures unit for post procedure
observation.
FINDINGS: The images document guide needle placement within the left
periaortic retroperitoneal adenopathy. Post biopsy images
demonstrate no hemorrhage or hematoma.

COMPLICATIONS:
None immediate.
IMPRESSION: Successful CT-guided core biopsy of the left retroperitoneal
periaortic adenopathy

## 2019-02-09 MED ORDER — FENTANYL CITRATE (PF) 100 MCG/2ML IJ SOLN
INTRAMUSCULAR | Status: AC
  Filled 2019-02-09: qty 2

## 2019-02-09 MED ORDER — MIDAZOLAM HCL 2 MG/2ML IJ SOLN
INTRAMUSCULAR | Status: AC
  Filled 2019-02-09: qty 2

## 2019-02-09 MED ORDER — NALOXONE HCL 0.4 MG/ML IJ SOLN
INTRAMUSCULAR | Status: AC
  Filled 2019-02-09: qty 1

## 2019-02-09 MED ORDER — MIDAZOLAM HCL 2 MG/2ML IJ SOLN
INTRAMUSCULAR | Status: AC | PRN
  Administered 2019-02-09 (×2): 1 mg via INTRAVENOUS

## 2019-02-09 MED ORDER — LIDOCAINE HCL (PF) 1 % IJ SOLN
INTRAMUSCULAR | Status: AC | PRN
  Administered 2019-02-09: 10 mL

## 2019-02-09 MED ORDER — FENTANYL CITRATE (PF) 100 MCG/2ML IJ SOLN
INTRAMUSCULAR | Status: AC | PRN
  Administered 2019-02-09 (×2): 50 ug via INTRAVENOUS

## 2019-02-09 MED ORDER — FLUMAZENIL 0.5 MG/5ML IV SOLN
INTRAVENOUS | Status: AC
  Filled 2019-02-09: qty 5

## 2019-02-09 MED ORDER — SODIUM CHLORIDE 0.9 % IV SOLN: INTRAVENOUS | Status: DC

## 2019-02-09 NOTE — Discharge Instructions (Addendum)
Biopsy Discharge Instructions  The procedure you just had is called a biopsy.  You may feel some discomfort after the local anesthetic wears off.  Your discomfort should improve over the next several days.  AFTER YOUR BIOPSY  Rest for the remainder of the day.  Avoid heavy lifting (more than 10 lb/4.5 kg).  If you have been given a general anesthetic or other medications to help you relax, you should not operate machinery, drive or make legal decisions for 24 hours after your procedure.  Additionally, someone must be available to drive you home.  Only take over-the-counter or prescription medicines for pain, discomfort, or fever as directed by your caregiver.  This can make bleeding worse.  You may resume your usual diet after the procedure.  Avoid alcoholic beverages for 24 hours after your procedure.  Keep the skin around your biopsy site clean and dry.  You may shower after 24 hours.  Cleanse and dry the biopsy site completely after you shower.  Avoid baths and swimming for 72 hours.  Complications are very uncommon after this procedure.  Go to the nearest Emergency Department or contact your caregiver if you develop any of the following symptoms:  Worsening pain  Bleeding  Swelling at the biopsy site  Light headedness or dizziness  Shortness of Breath  Fever or chills; redness or drainage from the site       Moderate Conscious Sedation, Adult, Care After These instructions provide you with information about caring for yourself after your procedure. Your health care provider may also give you more specific instructions. Your treatment has been planned according to current medical practices, but problems sometimes occur. Call your health care provider if you have any problems or questions after your procedure. What can I expect after the procedure? After your procedure, it is common:  To feel sleepy for several hours.  To feel clumsy and have poor balance for several  hours.  To have poor judgment for several hours.  To vomit if you eat too soon. Follow these instructions at home: For at least 24 hours after the procedure:   Do not: ? Participate in activities where you could fall or become injured. ? Drive. ? Use heavy machinery. ? Drink alcohol. ? Take sleeping pills or medicines that cause drowsiness. ? Make important decisions or sign legal documents. ? Take care of children on your own.  Rest. Eating and drinking  Follow the diet recommended by your health care provider.  If you vomit: ? Drink water, juice, or soup when you can drink without vomiting. ? Make sure you have little or no nausea before eating solid foods. General instructions  Have a responsible adult stay with you until you are awake and alert.  Take over-the-counter and prescription medicines only as told by your health care provider.  If you smoke, do not smoke without supervision.  Keep all follow-up visits as told by your health care provider. This is important. Contact a health care provider if:  You keep feeling nauseous or you keep vomiting.  You feel light-headed.  You develop a rash.  You have a fever. Get help right away if:  You have trouble breathing. This information is not intended to replace advice given to you by your health care provider. Make sure you discuss any questions you have with your health care provider. Document Revised: 12/10/2016 Document Reviewed: 04/19/2015 Elsevier Patient Education  2020 [employer].

## 2019-02-09 NOTE — Procedures (Signed)
Lymphoma  S/p CT LEFT rp PARAAORTIC NODE BX  No comp Stable ebl min Path pending Full report in pacs

## 2019-02-09 NOTE — H&P (Signed)
Referring Physician(s): [name]  Supervising Physician: Daryll Brod  Patient Status:  [facility]  Chief Complaint:  "I'm having a biopsy"  Subjective: Patient familiar to IR service from [facility] placements in 2005 and 2015.  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  Recent follow-up imaging has revealed interval development of abdominal and pelvic adenopathy concerning for recurrent lymphoma.  He presents today for CT-guided left paraaortic lymph node biopsy for further evaluation.  He currently denies fever, headache, chest pain, dyspnea, cough, abdominal/back pain, nausea, vomiting or bleeding.  Additional history as below.  Past Medical History:  Diagnosis Date  . Acute pericarditis    a. 04/2013 -adm with CP, elevated CRP. H/o coronary artery calcification on prior CT but nuc was negative, EF 71%.  . Arthritis   . Atrial fibrillation ([place])    a. Isolated episode in the setting of acute pericarditis 04/2013. Was not placed on anticoag.  Marland Kitchen BPH (benign prostatic hyperplasia)   . Cataract   . Diverticulitis 08/16/2013  . Hyperlipidemia    elevated triglycerides  . 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 Pain in joint, pelvic region and thigh 08/01/2013  . PSVT (paroxysmal supraventricular tachycardia) ([place])    Past Surgical History:  Procedure Laterality Date  . CHOLECYSTECTOMY    . EXPLORATORY LAPAROTOMY    . EYE SURGERY  2016   cataract  . LUNG LOBECTOMY     right side  . LYMPH NODE BIOPSY     in groin with removal  . MENISCUS REPAIR     right knee    . PROSTATE SURGERY    . REFRACTIVE SURGERY Right    piece of metal removed  . SUBMANDIBULAR GLAND EXCISION  04/2011  . SUBMANDIBULAR GLAND EXCISION  04/30/2011   Procedure: EXCISION SUBMANDIBULAR GLAND;  Surgeon: Jerrell Belfast, MD;  Location: [facility];  Service: ENT;  Laterality: Left;  WITH DIAGNOSTIC BIOPSY  . TONSILLECTOMY     as a child  . VEIN LIGATION AND STRIPPING     right leg       Allergies: Patient has no known allergies.  Medications: Prior to Admission medications   Medication Sig Start Date End Date Taking? Authorizing Provider  diltiazem (CARDIZEM CD) 120 MG 24 hr capsule Take 1 capsule (120 mg total) by mouth every morning. 07/17/18  Yes Dettinger, [name], MD  LUMIGAN 0.01 % SOLN Place 1 drop into both eyes daily. 03/07/17  Yes [provider]  Magnesium 250 MG TABS Take 1 tablet by mouth daily.   Yes [provider]  metoprolol succinate (TOPROL XL) 25 MG 24 hr tablet Take 1 tablet (25 mg total) by mouth daily. 07/17/18  Yes Dettinger, [name], MD  Multiple Vitamin (MULTIVITAMIN WITH MINERALS) TABS tablet Take 1 tablet by mouth every morning.    Yes [provider]  NON FORMULARY Take 10 mg by mouth daily. CBD OIL   Yes [provider]  omeprazole (PRILOSEC) 20 MG capsule Take 1 capsule (20 mg total) by mouth daily. 07/17/18  Yes Dettinger, Vonna Kotyk  A, MD  pravastatin (PRAVACHOL) 20 MG tablet Take 1 tablet (20 mg total) by mouth daily. 07/17/18  Yes Dettinger, [name], MD  Probiotic Product (PROBIOTIC DAILY PO) Take 1 capsule by mouth daily.    Yes [provider]  tamsulosin (FLOMAX) 0.4 MG CAPS capsule Take 2 capsules (0.8 mg total) by mouth daily after supper. 07/17/18  Yes Dettinger, [name], MD     Vital Signs: BP 125/89 (BP Location: Right Arm)   Pulse 66   Temp 98.3 F (36.8 C) (Oral)   Resp 18   Ht 6\' 2"  (1.88 m)   Wt 182 lb (82.6 kg)   SpO2 99%   BMI 23.37 kg/m   Physical Exam awake, alert.  Chest clear to  auscultation bilaterally.  Heart with regular rate and rhythm.  Abdomen soft, positive bowel sounds, nontender.  No lower extremity edema.  Imaging: No results found.  Labs:  CBC: Recent Labs    07/13/18 1031 01/26/19 0901 02/09/19 0740  WBC 7.4 6.9 5.2  HGB 15.8 16.5 15.2  HCT 46.8 49.1 45.5  PLT 207 182 174    COAGS: No results for input(s): INR, APTT in the last 8760 hours.  BMP: Recent Labs    07/13/18 1031 01/26/19 0901  NA 140 140  K 4.6 5.1  CL 103 106  CO2 24 26  GLUCOSE 87 103*  BUN 12 15  CALCIUM 9.3 9.1  CREATININE 1.19 1.17  GFRNONAA 60 >60  GFRAA 70 >60    LIVER FUNCTION TESTS: Recent Labs    07/13/18 1031 01/26/19 0901  BILITOT 0.6 0.6  AST 18 16  ALT 17 17  ALKPHOS 65 68  PROT 6.2 7.0  ALBUMIN 4.3 4.2    Assessment and Plan: Pt with history of B-cell lymphoma with last treatment approximately 5 years ago.  Recent follow-up imaging has revealed interval development of abdominal and pelvic adenopathy concerning for recurrent lymphoma.  He presents today for CT-guided left paraaortic lymph node biopsy for further evaluation. Risks and benefits of procedure was discussed with the patient  including, but not limited to bleeding, infection, damage to adjacent structures or low yield requiring additional tests.  All of the questions were answered and there is agreement to proceed.  Consent signed and in chart.     Electronically Signed: D. Rowe Robert, PA-C 02/09/2019, 8:42 AM   I spent a total of 20 minutes at the the patient's bedside AND on the patient's hospital floor or unit, greater than 50% of which was counseling/coordinating care for CT-guided left paraaortic lymph node biopsy

## 2019-02-12 ENCOUNTER — Other Ambulatory Visit: Payer: Self-pay

## 2019-02-12 LAB — SURGICAL PATHOLOGY

## 2019-02-13 ENCOUNTER — Telehealth: Payer: Self-pay | Admitting: Pharmacist

## 2019-02-13 ENCOUNTER — Other Ambulatory Visit: Payer: Self-pay

## 2019-02-13 ENCOUNTER — Inpatient Hospital Stay: Payer: Medicare Other | Attending: Hematology and Oncology | Admitting: Hematology and Oncology

## 2019-02-13 ENCOUNTER — Encounter: Payer: Self-pay | Admitting: Hematology and Oncology

## 2019-02-13 DIAGNOSIS — R109 Unspecified abdominal pain: Secondary | ICD-10-CM | POA: Insufficient documentation

## 2019-02-13 DIAGNOSIS — I952 Hypotension due to drugs: Secondary | ICD-10-CM | POA: Diagnosis not present

## 2019-02-13 DIAGNOSIS — R1013 Epigastric pain: Secondary | ICD-10-CM | POA: Diagnosis not present

## 2019-02-13 DIAGNOSIS — I2510 Atherosclerotic heart disease of native coronary artery without angina pectoris: Secondary | ICD-10-CM | POA: Diagnosis not present

## 2019-02-13 DIAGNOSIS — R42 Dizziness and giddiness: Secondary | ICD-10-CM | POA: Diagnosis not present

## 2019-02-13 DIAGNOSIS — Z9221 Personal history of antineoplastic chemotherapy: Secondary | ICD-10-CM | POA: Insufficient documentation

## 2019-02-13 DIAGNOSIS — Z7189 Other specified counseling: Secondary | ICD-10-CM | POA: Diagnosis not present

## 2019-02-13 DIAGNOSIS — C8510 Unspecified B-cell lymphoma, unspecified site: Secondary | ICD-10-CM | POA: Insufficient documentation

## 2019-02-13 DIAGNOSIS — N400 Enlarged prostate without lower urinary tract symptoms: Secondary | ICD-10-CM | POA: Insufficient documentation

## 2019-02-13 DIAGNOSIS — I700 Atherosclerosis of aorta: Secondary | ICD-10-CM | POA: Insufficient documentation

## 2019-02-13 DIAGNOSIS — J439 Emphysema, unspecified: Secondary | ICD-10-CM | POA: Insufficient documentation

## 2019-02-13 DIAGNOSIS — Z79899 Other long term (current) drug therapy: Secondary | ICD-10-CM | POA: Diagnosis not present

## 2019-02-13 DIAGNOSIS — C8280 Other types of follicular lymphoma, unspecified site: Secondary | ICD-10-CM

## 2019-02-13 LAB — SURGICAL PATHOLOGY

## 2019-02-13 MED ORDER — IDELALISIB 150 MG PO TABS: 150.0000 mg | ORAL_TABLET | Freq: Two times a day (BID) | ORAL | 11 refills | Status: DC

## 2019-02-13 NOTE — Assessment & Plan Note (Signed)
His abdominal pain is due to disease but he is not profoundly symptomatic I will recommend observation for now If his pain gets worse, I will start him on prednisone

## 2019-02-13 NOTE — Telephone Encounter (Signed)
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  CMP from 01/26/19 assessed, no relevant lab abnormalities. Prescription dose and frequency assessed.   Current medication list in Epic reviewed, a few DDIs with idelalisib identified: Diltiazem- Idelalisib may increase the concentration of diltiazem, category X interaction. The recommendation is to avoid concurrent therapy. Of note, the current dose of diltiazem is a low dose. Tamsulosin- Idelalisib may increase the concentration of tamsulosin, category X interaction. The recommendation is to avoid concurrent therapy. DDIs shared with Dr. Lottie Rater.  Prescription has been e-scribed to the [facility] for benefits analysis and approval.  Oral Oncology Clinic will continue to follow for insurance authorization, copayment issues, initial counseling and start date.  Darl Pikes, PharmD, BCPS, [facility] Hematology/Oncology Clinical Pharmacist [facility] [phone]  02/13/2019 6:41 PM

## 2019-02-13 NOTE — Assessment & Plan Note (Signed)
We have extensive discussions about goals of care He is aware that treatment is effective but his condition is not considered to be curable but treatable

## 2019-02-13 NOTE — Progress Notes (Signed)
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[facility]) The final pathology report was available at the time of dictation which confirmed the diagnosis of recurrent low-grade B-cell lymphoma He is symptomatic We reviewed the current guidelines We discussed the risk and benefits of treating him with prior chemotherapy, combination lenalidomide with rituximab or idelalisib After extensive discussion, he is in agreement to try idelalisib I will try to get help from pharmacist for insurance prior authorization and medical dispensing of the drug Hopefully, he can get started as early as next week He will likely need antimicrobial prophylaxis and allopurinol for tumor lysis prophylaxis but I will hold off until I have further discussion with the pharmacist We will also prescribe some antiemetics to use as needed For the first month of treatment, he is aware that he will need blood draw on a weekly basis I will see him around the second week of treatment for toxicity review  We discussed the risks, benefits, side effects of idelalisib and he is in agreement to proceed  Abdominal pain His abdominal pain is due to disease but he is not profoundly symptomatic I will recommend observation for now If his pain gets worse, I will start him on prednisone  Goals of care, counseling/discussion We have extensive discussions about goals of care He is aware that treatment is effective but his condition is not considered to be curable but treatable   No orders of  the defined types were placed in this encounter.   All questions were answered. The patient knows to call the clinic with any problems, questions or concerns. The total time spent in the appointment was 40 minutes encounter with patients including review of chart and various tests results, [name], MD 02/13/2019 6:37 PM  INTERVAL HISTORY: Please see below for problem oriented charting. He returns with his wife for further follow-up He tolerated recent biopsy well Denies other new symptoms  SUMMARY OF ONCOLOGIC HISTORY: Oncology History Overview Note  Low grade B-cell lymphoma   Primary site: Lymphoid Neoplasms   Staging method: AJCC 6th Edition   Clinical: Stage IV signed by [name], MD on 09/26/2013  9:15 AM   Summary: Stage IV      Follicular low grade B-cell lymphoma ([place])  12/10/2003 Surgery   Inguinal lymph node biopsy showed follicular lymphoma.   12/12/2003 - 06/01/2004 Chemotherapy   He was treated with R. CHOP chemotherapy which show complete remission. The number of cycles of R. CHOP chemotherapy was unknown.   12/19/2006 Surgery   Lung resection show follicular lymphoma.   01/02/2007 - 09/01/2008 Chemotherapy   The patient was treated with single agent rituximab alone.   01/19/2007 Bone Marrow Biopsy   Bone marrow biopsy was negative.   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   05/08/2013 - 05/11/2013 Hospital Admission   The patient was admitted to the hospital for management of pericarditis. CT scan showed extensive lymphadenopathy.   06/07/2013 Imaging   PET/CT scan showed extensive lymphadenopathy   06/25/2013 Procedure   He  has placement of Infuse-a-Port.   06/28/2013 Bone Marrow Biopsy   Bone marrow biopsy is positive for lymphoma involvement.   07/04/2013 - 11/22/2013 Chemotherapy   He is treated with 6 cycles of bendamustine with rituximab.    09/24/2013 Imaging   Repeat PET scan show complete remission.   12/26/2013 Imaging   PEt scan showed complete remission   09/26/2014 Imaging   CT scan of the chest abdomen and pelvis show no evidence of disease   03/26/2015 Imaging   CT scan showed no evidence of lymphoma   04/14/2016 Imaging   CT: Borderline prominent right hilar and subcarinal lymph nodes, but not appreciably changed. 2. Low-grade but increased central mesenteric stranding with some small mesenteric lymph nodes. This could certainly be inflammatory, 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, [name], volume estimated at 75 cubic cm.   02/15/2017 Imaging   No evidence of recurrent lymphoma or other acute findings.  Stable moderate hiatal hernia.  Colonic diverticulosis, without radiographic evidence of diverticulitis.  Stable mildly enlarged prostate.  Mild emphysema.  Aortic and coronary artery atherosclerosis.   01/31/2019 Imaging   1. Interval development of abdominal and pelvic adenopathy compatible with recurrent lymphoma. 2. Emphysema and aortic atherosclerosis. 3. Multi vessel coronary artery calcifications. 4. Moderate to large hiatal hernia   02/09/2019 Procedure   Successful CT-guided core biopsy of the left retroperitoneal periaortic adenopathy   02/09/2019 Pathology Results   SURGICAL PATHOLOGY  CASE: [id number]  PATIENT: Anthony Kelly  Surgical Pathology Report   Clinical History: Lymphoma; Left para aortic  adenopathy ([facility])   FINAL MICROSCOPIC DIAGNOSIS:   A. LYMPH NODE, LEFT PARA AORTIC, [name], cleaved nuclei and scant cytoplasm.  By immunohistochemistry, [name], [name], [name], [name] is low (less than 10%).  Flow cytometry was attempted; however, there was insufficient material for analysis (see [id number]).  Overall, the features are consistent with relapse of the patient's previously diagnosed follicular lymphoma.  Based on the biopsy, this is favored to be a low-grade follicular lymphoma     REVIEW OF SYSTEMS:   Constitutional: Denies fevers, chills or abnormal weight loss Eyes: Denies blurriness of vision Ears, nose, mouth, throat, and face: Denies mucositis or sore throat Respiratory: Denies cough, dyspnea or wheezes Cardiovascular: Denies palpitation, chest discomfort or lower extremity swelling Gastrointestinal:  Denies nausea, heartburn or change in bowel habits Skin: Denies abnormal skin rashes Lymphatics: Denies new lymphadenopathy or easy bruising Neurological:Denies numbness, tingling or new weaknesses Behavioral/Psych: Mood is stable, no new changes  All other systems were reviewed with the patient and are negative.  I have reviewed the past medical history, past surgical history, social history and  family history with the patient and they are unchanged from previous note.  ALLERGIES:  has No Known Allergies.  MEDICATIONS:  Current Outpatient Medications  Medication Sig Dispense Refill  . diltiazem (CARDIZEM  CD) 120 MG 24 hr capsule Take 1 capsule (120 mg total) by mouth every morning. 90 capsule 3  . idelalisib (ZYDELIG) 150 MG tablet Take 1 tablet (150 mg total) by mouth 2 (two) times daily. 60 tablet 11  . LUMIGAN 0.01 % SOLN Place 1 drop into both eyes daily.    . Magnesium 250 MG TABS Take 1 tablet by mouth daily.    . metoprolol succinate (TOPROL XL) 25 MG 24 hr tablet Take 1 tablet (25 mg total) by mouth daily. 90 tablet 3  . Multiple Vitamin (MULTIVITAMIN WITH MINERALS) TABS tablet Take 1 tablet by mouth every morning.     . NON FORMULARY Take 10 mg by mouth daily. CBD OIL    . omeprazole (PRILOSEC) 20 MG capsule Take 1 capsule (20 mg total) by mouth daily. 90 capsule 3  . pravastatin (PRAVACHOL) 20 MG tablet Take 1 tablet (20 mg total) by mouth daily. 90 tablet 3  . Probiotic Product (PROBIOTIC DAILY PO) Take 1 capsule by mouth daily.     . tamsulosin (FLOMAX) 0.4 MG CAPS capsule Take 2 capsules (0.8 mg total) by mouth daily after supper. 180 capsule 3   No current facility-administered medications for this visit.    PHYSICAL EXAMINATION: ECOG PERFORMANCE STATUS: 1 - Symptomatic but completely ambulatory  Vitals:   02/13/19 1340  BP: 104/64  Pulse: 62  Resp: 18  Temp: 97.9 F (36.6 C)  SpO2: 99%   Filed Weights   02/13/19 1340  Weight: 191 lb 3.2 oz (86.7 kg)    GENERAL:alert, no distress and comfortable Musculoskeletal:no cyanosis of digits and no clubbing  NEURO: alert & oriented x 3 with fluent speech, no focal motor/sensory deficits  LABORATORY DATA:  I have reviewed the data as listed    Component Value Date/Time   NA 140 01/26/2019 0901   NA 140 07/13/2018 1031   NA 139 04/14/2016 0943   K 5.1 01/26/2019 0901   K 4.6 04/14/2016 0943   CL 106 01/26/2019 0901   CL 104 03/24/2012 1024   CO2 26 01/26/2019 0901   CO2 25 04/14/2016 0943   GLUCOSE 103 (H) 01/26/2019 0901   GLUCOSE 111 04/14/2016 0943   GLUCOSE 80 03/24/2012 1024   BUN 15 01/26/2019 0901   BUN  12 07/13/2018 1031   BUN 8.6 04/14/2016 0943   CREATININE 1.17 01/26/2019 0901   CREATININE 1.1 04/14/2016 0943   CALCIUM 9.1 01/26/2019 0901   CALCIUM 9.4 04/14/2016 0943   PROT 7.0 01/26/2019 0901   PROT 6.2 07/13/2018 1031   PROT 6.5 04/14/2016 0943   ALBUMIN 4.2 01/26/2019 0901   ALBUMIN 4.3 07/13/2018 1031   ALBUMIN 3.8 04/14/2016 0943   AST 16 01/26/2019 0901   AST 28 04/14/2016 0943   ALT 17 01/26/2019 0901   ALT 41 04/14/2016 0943   ALKPHOS 68 01/26/2019 0901   ALKPHOS 71 04/14/2016 0943   BILITOT 0.6 01/26/2019 0901   BILITOT 0.6 07/13/2018 1031   BILITOT 0.61 04/14/2016 0943   GFRNONAA >60 01/26/2019 0901   GFRAA >60 01/26/2019 0901    No results found for: SPEP, UPEP  Lab Results  Component Value Date   WBC 5.2 02/09/2019   NEUTROABS 5.0 01/26/2019   HGB 15.2 02/09/2019   HCT 45.5 02/09/2019  MCV 90.1 02/09/2019   PLT 174 02/09/2019      Chemistry      Component Value Date/Time   NA 140 01/26/2019 0901   NA 140 07/13/2018 1031   NA 139 04/14/2016 0943   K 5.1 01/26/2019 0901   K 4.6 04/14/2016 0943   CL 106 01/26/2019 0901   CL 104 03/24/2012 1024   CO2 26 01/26/2019 0901   CO2 25 04/14/2016 0943   BUN 15 01/26/2019 0901   BUN 12 07/13/2018 1031   BUN 8.6 04/14/2016 0943   CREATININE 1.17 01/26/2019 0901   CREATININE 1.1 04/14/2016 0943      Component Value Date/Time   CALCIUM 9.1 01/26/2019 0901   CALCIUM 9.4 04/14/2016 0943   ALKPHOS 68 01/26/2019 0901   ALKPHOS 71 04/14/2016 0943   AST 16 01/26/2019 0901   AST 28 04/14/2016 0943   ALT 17 01/26/2019 0901   ALT 41 04/14/2016 0943   BILITOT 0.6 01/26/2019 0901   BILITOT 0.6 07/13/2018 1031   BILITOT 0.61 04/14/2016 0943       RADIOGRAPHIC STUDIES: I have personally reviewed the radiological images as listed and agreed with the findings in the report. CT Chest W Contrast  Result Date: 01/31/2019 CLINICAL DATA:  Followup lymphoma. New onset dyspnea. EXAM: CT CHEST, ABDOMEN, AND  PELVIS WITH CONTRAST TECHNIQUE: Multidetector CT imaging of the chest, abdomen and pelvis was performed following the standard protocol during bolus administration of intravenous contrast. CONTRAST:  <See Chart> OMNIPAQUE IOHEXOL 300 MG/ML SOLN, <See Chart> OMNIPAQUE IOHEXOL 300 MG/ML SOLN COMPARISON:  02/15/2017 FINDINGS: CT CHEST FINDINGS Cardiovascular: The heart size appears within normal limits. Aortic atherosclerosis. Lad, left circumflex and RCA coronary artery calcifications. Mediastinum/Nodes: The trachea appears patent and is midline. Normal appearance of the thyroid gland. Moderate size hiatal hernia. No mediastinal, axillary, or supraclavicular adenopathy. Lungs/Pleura: No pleural effusion. Paraseptal and centrilobular emphysema. Previous wedge resection from the superior segment right lower lobe. No suspicious lung nodule Musculoskeletal: No chest wall mass or suspicious bone lesions identified. CT ABDOMEN PELVIS FINDINGS Hepatobiliary: Stable 1.1 cm left lobe of liver cysts. The no suspicious liver abnormality identified. Cholecystectomy. Chronic mild increase caliber of the intrahepatic ducts and common bile duct. The CBD measures 1.2 cm, image 61/4. Unchanged. Pancreas: Unremarkable. No pancreatic ductal dilatation or surrounding inflammatory changes. Spleen: Normal in size without focal abnormality. Adrenals/Urinary Tract: Normal appearance of the adrenal glands. Left kidney cyst measures 3.8 cm, image 80/4. Normal right kidney. The urinary bladder appears normal. Stomach/Bowel: Hiatal hernia. The small bowel loops are nondistended. No wall thickening or inflammation. Extensive distal colonic diverticulosis without acute inflammation. Vascular/Lymphatic: Aortic atherosclerosis without aneurysm. Adenopathy noted within the abdomen and pelvis. -Mesenteric mass measures 7.8 x 3.0 cm, image 80/4. New from previous exam. -Left periaortic node measures 1.3 cm, image 86/4. New from previous exam. -Pre  caval node measures 1.4 cm, image 91/4. New from previous exam. -Node just below the aortic bifurcation measures 1.7 cm, image 99/4. New. -Left common iliac lymph node measures 2.1 cm, image 97/4.  New. -Right external iliac lymph node measures 1.7 cm, image 112/4.  New. Reproductive: Prostate gland enlargement. Other: No free fluid or fluid collections. Musculoskeletal: Lumbar degenerative disc disease. Most advanced at L5-S1. IMPRESSION: 1. Interval development of abdominal and pelvic adenopathy compatible with recurrent lymphoma. 2. Emphysema and aortic atherosclerosis. 3. Multi vessel coronary artery calcifications. 4. Moderate to large hiatal hernia Aortic Atherosclerosis (ICD10-I70.0) and Emphysema (ICD10-J43.9). Electronically Signed   By: Lovena Le  Clovis Riley M.D.   On: 01/31/2019 15:39   CT Abdomen Pelvis W Contrast  Result Date: 01/31/2019 CLINICAL DATA:  Followup lymphoma. New onset dyspnea. EXAM: CT CHEST, ABDOMEN, AND PELVIS WITH CONTRAST TECHNIQUE: Multidetector CT imaging of the chest, abdomen and pelvis was performed following the standard protocol during bolus administration of intravenous contrast. CONTRAST:  <See Chart> OMNIPAQUE IOHEXOL 300 MG/ML SOLN, <See Chart> OMNIPAQUE IOHEXOL 300 MG/ML SOLN COMPARISON:  02/15/2017 FINDINGS: CT CHEST FINDINGS Cardiovascular: The heart size appears within normal limits. Aortic atherosclerosis. Lad, left circumflex and RCA coronary artery calcifications. Mediastinum/Nodes: The trachea appears patent and is midline. Normal appearance of the thyroid gland. Moderate size hiatal hernia. No mediastinal, axillary, or supraclavicular adenopathy. Lungs/Pleura: No pleural effusion. Paraseptal and centrilobular emphysema. Previous wedge resection from the superior segment right lower lobe. No suspicious lung nodule Musculoskeletal: No chest wall mass or suspicious bone lesions identified. CT ABDOMEN PELVIS FINDINGS Hepatobiliary: Stable 1.1 cm left lobe of liver cysts.  The no suspicious liver abnormality identified. Cholecystectomy. Chronic mild increase caliber of the intrahepatic ducts and common bile duct. The CBD measures 1.2 cm, image 61/4. Unchanged. Pancreas: Unremarkable. No pancreatic ductal dilatation or surrounding inflammatory changes. Spleen: Normal in size without focal abnormality. Adrenals/Urinary Tract: Normal appearance of the adrenal glands. Left kidney cyst measures 3.8 cm, image 80/4. Normal right kidney. The urinary bladder appears normal. Stomach/Bowel: Hiatal hernia. The small bowel loops are nondistended. No wall thickening or inflammation. Extensive distal colonic diverticulosis without acute inflammation. Vascular/Lymphatic: Aortic atherosclerosis without aneurysm. Adenopathy noted within the abdomen and pelvis. -Mesenteric mass measures 7.8 x 3.0 cm, image 80/4. New from previous exam. -Left periaortic node measures 1.3 cm, image 86/4. New from previous exam. -Pre caval node measures 1.4 cm, image 91/4. New from previous exam. -Node just below the aortic bifurcation measures 1.7 cm, image 99/4. New. -Left common iliac lymph node measures 2.1 cm, image 97/4.  New. -Right external iliac lymph node measures 1.7 cm, image 112/4.  New. Reproductive: Prostate gland enlargement. Other: No free fluid or fluid collections. Musculoskeletal: Lumbar degenerative disc disease. Most advanced at L5-S1. IMPRESSION: 1. Interval development of abdominal and pelvic adenopathy compatible with recurrent lymphoma. 2. Emphysema and aortic atherosclerosis. 3. Multi vessel coronary artery calcifications. 4. Moderate to large hiatal hernia Aortic Atherosclerosis (ICD10-I70.0) and Emphysema (ICD10-J43.9). Electronically Signed   By: Kerby Moors M.D.   On: 01/31/2019 15:39   CT BIOPSY  Result Date: 02/09/2019 INDICATION: History of B-cell lymphoma, recurrent retroperitoneal periaortic adenopathy EXAM: CT-GUIDED BIOPSY LEFT RETROPERITONEAL PERIAORTIC ADENOPATHY MEDICATIONS:  1% LIDOCAINE LOCAL ANESTHESIA/SEDATION: 2.0 mg IV Versed; 100 mcg IV Fentanyl Moderate Sedation Time:  12 MINUTES The patient was continuously monitored during the procedure by the interventional radiology nurse under my direct supervision. PROCEDURE: The procedure, risks, benefits, and alternatives were explained to the patient. Questions regarding the procedure were encouraged and answered. The patient understands and consents to the procedure. Previous imaging reviewed. Patient position prone. Noncontrast localization CT performed. The left periaortic adenopathy was localized and marked for a posterolateral approach. Under sterile conditions and local anesthesia, a 17 gauge 16.8 cm access was advanced to the left periaortic adenopathy. Needle position confirmed with CT. 1 cm 18 gauge core biopsies obtained. Samples were placed in saline. Needle tract occluded with Gel-Foam. Patient tolerated the procedure well without complication. Vital sign monitoring by nursing staff during the procedure will continue as patient is in the special procedures unit for post procedure observation. FINDINGS: The images document guide needle placement  within the left periaortic retroperitoneal adenopathy. Post biopsy images demonstrate no hemorrhage or hematoma. COMPLICATIONS: None immediate. IMPRESSION: Successful CT-guided core biopsy of the left retroperitoneal periaortic adenopathy Electronically Signed   By: [name].  Shick M.D.   On: 02/09/2019 11:03

## 2019-02-13 NOTE — Assessment & Plan Note (Signed)
The final pathology report was available at the time of dictation which confirmed the diagnosis of recurrent low-grade B-cell lymphoma He is symptomatic We reviewed the current guidelines We discussed the risk and benefits of treating him with prior chemotherapy, combination lenalidomide with rituximab or idelalisib After extensive discussion, he is in agreement to try idelalisib I will try to get help from pharmacist for insurance prior authorization and medical dispensing of the drug Hopefully, he can get started as early as next week He will likely need antimicrobial prophylaxis and allopurinol for tumor lysis prophylaxis but I will hold off until I have further discussion with the pharmacist We will also prescribe some antiemetics to use as needed For the first month of treatment, he is aware that he will need blood draw on a weekly basis I will see him around the second week of treatment for toxicity review  We discussed the risks, benefits, side effects of idelalisib and he is in agreement to proceed

## 2019-02-14 ENCOUNTER — Telehealth: Payer: Self-pay

## 2019-02-14 NOTE — Telephone Encounter (Signed)
Oral Oncology Patient Advocate Encounter  Prior Authorization for Eunice Blase has been approved.    PA# [id number] Effective dates: 01/15/19 through 01/10/2098  Patients co-pay is $33   Oral Oncology Clinic will continue to follow.   [place] Patient [place] Phone [phone] Fax [phone] 02/14/2019 2:32 PM

## 2019-02-14 NOTE — Telephone Encounter (Signed)
Oral Oncology Patient Advocate Encounter  Received notification from [employer] that prior authorization for Anthony Kelly is required.  PA submitted on CoverMyMeds Key [id number] Status is pending  Oral Oncology Clinic will continue to follow.  Anthony Kelly Patient [place] Phone [phone] Fax [phone] 02/14/2019 2:24 PM

## 2019-02-15 ENCOUNTER — Other Ambulatory Visit: Payer: Self-pay | Admitting: Hematology and Oncology

## 2019-02-15 ENCOUNTER — Encounter: Payer: Self-pay | Admitting: Family Medicine

## 2019-02-15 ENCOUNTER — Encounter: Payer: Self-pay | Admitting: Hematology and Oncology

## 2019-02-15 DIAGNOSIS — C8280 Other types of follicular lymphoma, unspecified site: Secondary | ICD-10-CM

## 2019-02-15 MED ORDER — ALLOPURINOL 300 MG PO TABS: 300.0000 mg | ORAL_TABLET | Freq: Every day | ORAL | 2 refills | Status: DC

## 2019-02-15 MED ORDER — ONDANSETRON HCL 8 MG PO TABS: 8.0000 mg | ORAL_TABLET | Freq: Three times a day (TID) | ORAL | 3 refills | Status: DC | PRN

## 2019-02-15 MED ORDER — PROCHLORPERAZINE MALEATE 10 MG PO TABS: 10.0000 mg | ORAL_TABLET | Freq: Four times a day (QID) | ORAL | 0 refills | Status: DC | PRN

## 2019-02-15 MED ORDER — ACYCLOVIR 400 MG PO TABS: 400.0000 mg | ORAL_TABLET | Freq: Two times a day (BID) | ORAL | 6 refills | Status: DC

## 2019-02-15 MED ORDER — TERAZOSIN HCL 2 MG PO CAPS: 2.0000 mg | ORAL_CAPSULE | Freq: Every day | ORAL | 3 refills | Status: DC

## 2019-02-15 MED FILL — ACYCLOVIR 400 MG TABLET: 400 | 15 days supply | Qty: 30 | Fill #0

## 2019-02-15 MED FILL — ALLOPURINOL 300 MG TABS: 300 | 30 days supply | Qty: 30 | Fill #0

## 2019-02-15 MED FILL — ONDANSETRON HCL 8 MG TABLET: 8 | 10 days supply | Qty: 30 | Fill #0

## 2019-02-15 MED FILL — PROCHLORPERAZINE 10 MG TAB: 10 | 7 days supply | Qty: 30 | Fill #0

## 2019-02-16 MED FILL — ZYDELIG 150 MG TABLET: 150 | 30 days supply | Qty: 60 | Fill #0

## 2019-02-18 ENCOUNTER — Encounter: Payer: Self-pay | Admitting: Hematology and Oncology

## 2019-02-19 NOTE — Telephone Encounter (Signed)
Oral Chemotherapy Pharmacist Encounter   Patient will start Zydelig therapy on 02/20/19  I spoke with patient and his wife for overview of new oral chemotherapy medication: Zydelig (idelalisib) for the treatment of recurrent follicular lymphoma, planned duration until disease progression or unacceptable toxicity.    Counseled patient on administration, dosing, side effects, monitoring, drug-food interactions, safe handling, storage, and disposal.   Patient will take Zydelig 150 mg tablets, 1 tablet (150 mg) by mouth 2 times daily, approximately 12 hours apart, taken with or without food.   Zydelig start date: 02/20/19   Side effects include but not limited to: fatigue, skin rash, diarrhea, nausea, abdominal pain, decreased blood counts, hepatotoxicity, cough, and severe infections.   Rare but serious adverse event of pneumonitis and intestinal perforation discussed with patient.   Patient will obtain an antidiarrheal medication and alert the office for 4 or more stools above baseline per day.  Patient has been prescribed allopurinol for tumor lysis prophylaxis and acyclovir for antimicrobial prophylaxis   Reviewed with patient importance of keeping a medication schedule and plan for any missed doses.   Medication reconciliation performed and medication/allergy list updated. Patient has discontinued his diltiazem and tamsulosin at this time.  Patient informed the pharmacy will reach out 5-7 days prior to needing next fill of Zydelig to coordinate continued medication acquisition to prevent break in therapy.   All questions answered.   Anthony Kelly voiced understanding and appreciation.    Patient knows to call the office with questions or concerns.   Leron Croak, PharmD, [place] PGY2 Hematology/Oncology Pharmacy Resident 02/19/2019 1:55 PM Oral Oncology Clinic [phone]

## 2019-02-20 ENCOUNTER — Encounter: Payer: Self-pay | Admitting: Hematology and Oncology

## 2019-02-20 ENCOUNTER — Telehealth: Payer: Self-pay | Admitting: Hematology and Oncology

## 2019-02-20 ENCOUNTER — Other Ambulatory Visit: Payer: Self-pay | Admitting: Hematology and Oncology

## 2019-02-20 ENCOUNTER — Telehealth: Payer: Self-pay | Admitting: Family Medicine

## 2019-02-20 MED ORDER — TERAZOSIN HCL 2 MG PO CAPS: 2.0000 mg | ORAL_CAPSULE | Freq: Every day | ORAL | 3 refills | Status: DC

## 2019-02-20 MED ORDER — ACYCLOVIR 400 MG PO TABS: 400.0000 mg | ORAL_TABLET | Freq: Two times a day (BID) | ORAL | 6 refills | Status: DC

## 2019-02-20 NOTE — Telephone Encounter (Signed)
Scheduled appt per 2/05 sch message - pt wife aware of appt date and time

## 2019-02-20 NOTE — Telephone Encounter (Signed)
What is the name of the medication? terazosin cap 2mg   Have you contacted your pharmacy to request a refill? no  Which pharmacy would you like this sent to? [facility]   Patient notified that their request is being sent to the clinical staff for review and that they should receive a call once it is complete. If they do not receive a call within 24 hours they can check with their pharmacy or our office.   Pt will need about 3 until mail order comes in this friday

## 2019-02-21 ENCOUNTER — Ambulatory Visit (INDEPENDENT_AMBULATORY_CARE_PROVIDER_SITE_OTHER): Payer: Medicare Other | Admitting: *Deleted

## 2019-02-21 ENCOUNTER — Telehealth: Payer: Self-pay | Admitting: *Deleted

## 2019-02-21 DIAGNOSIS — Z0000 Encounter for general adult medical examination without abnormal findings: Secondary | ICD-10-CM | POA: Diagnosis not present

## 2019-02-21 DIAGNOSIS — N400 Enlarged prostate without lower urinary tract symptoms: Secondary | ICD-10-CM

## 2019-02-21 DIAGNOSIS — E782 Mixed hyperlipidemia: Secondary | ICD-10-CM

## 2019-02-21 DIAGNOSIS — R7303 Prediabetes: Secondary | ICD-10-CM

## 2019-02-21 DIAGNOSIS — Z1159 Encounter for screening for other viral diseases: Secondary | ICD-10-CM

## 2019-02-21 NOTE — Telephone Encounter (Signed)
Let him know that I have placed the orders for the blood work, they should be able to draw these orders and do them at [facility] I believe, some of them may be duplicates from what they are doing but they can just do what ever they need and add mine on top of it.

## 2019-02-21 NOTE — Telephone Encounter (Signed)
Wife aware and verbalizes understanding per dpr.  °

## 2019-02-21 NOTE — Telephone Encounter (Signed)
During the pt's AWV today he mentioned he had a follow up with you in March and he wanted to know if he could just have his labs drawn at [facility] when he has his labs for his cancer drawn and then do a televisit with you in March?

## 2019-02-21 NOTE — Progress Notes (Signed)
MEDICARE ANNUAL WELLNESS VISIT  02/21/2019  Telephone Visit Disclaimer This Medicare AWV was conducted by telephone due to national recommendations for restrictions regarding the COVID-19 Pandemic (e.g. social distancing).  I verified, using two identifiers, that I am speaking with Anthony Kelly or their authorized healthcare agent. I discussed the limitations, risks, security, and privacy concerns of performing an evaluation and management service by telephone and the potential availability of an in-person appointment in the future. The patient expressed understanding and agreed to proceed.   Subjective:  Anthony Kelly is a 74 y.o. male patient of Anthony Kelly, Anthony Kaufmann, MD who had a Medicare Annual Wellness Visit today via telephone. Anthony Kelly is Retired and lives with their spouse. he has 2 biologic children and 2 stepchildren. he reports that he is socially active and does interact with friends/family regularly. he is minimally physically active and enjoys gardening.  Patient Care Team: Anthony Kelly, Anthony Kaufmann, MD as PCP - General (Family Medicine) Anthony Commons, MD as PCP - Cardiology (Cardiology) Anthony Mayer, MD as Consulting Physician (Gastroenterology) Anthony Commons, MD as Consulting Physician (Cardiology) Anthony Lark, MD as Consulting Physician (Hematology and Oncology) Anthony Brownie, MD as Consulting Physician (Dermatology)  Advanced Directives 02/21/2019 02/09/2019 02/17/2018 10/16/2015 03/27/2015 02/06/2015 09/27/2014  Does Patient Have a Medical Advance Directive? Yes Yes Yes Yes Yes Yes Yes  Type of Paramedic of [place];Living will [place];Living will [place];Living will [place];Living will [place];Living will [place];Living will [place];Living will  Does patient want to make changes to medical advance directive? No -  Patient declined - No - Patient declined No - Patient declined No - Patient declined No - Patient declined No - Patient declined  Copy of [place] in Chart? No - copy requested No - copy requested No - copy requested Yes Yes Yes No - copy requested  Would patient like information on creating a medical advance directive? - - - - - - -  Pre-existing out of facility DNR order (yellow form or pink MOST form) - - - - - - -    Hospital Utilization Over the Past 12 Months: # of hospitalizations or ER visits: 0 # of surgeries: 0  Review of Systems    Patient reports that his overall health is worse compared to last year.  History obtained from chart review  Patient Reported Readings (BP, Pulse, CBG, Weight, etc) none  Pain Assessment Pain : No/denies pain     Current Medications & Allergies (verified) Allergies as of 02/21/2019   No Known Allergies     Medication List       Accurate as of February 21, 2019 10:28 AM. If you have any questions, ask your nurse or doctor.        acyclovir 400 MG tablet Commonly known as: ZOVIRAX Take 1 tablet (400 mg total) by mouth 2 (two) times daily.   allopurinol 300 MG tablet Commonly known as: ZYLOPRIM Take 1 tablet (300 mg total) by mouth daily.   brimonidine 0.2 % ophthalmic solution Commonly known as: ALPHAGAN 1 drop 2 (two) times daily.   idelalisib 150 MG tablet Commonly known as: ZYDELIG Take 1 tablet (150 mg total) by mouth 2 (two) times daily.   Lumigan 0.01 % Soln Generic drug: bimatoprost Place 1 drop into both eyes daily.   Magnesium 250 MG Tabs Take 1 tablet by mouth daily.  metoprolol succinate 25 MG 24 hr tablet Commonly known as: Toprol XL Take 1 tablet (25 mg total) by mouth daily.   multivitamin with minerals Tabs tablet Take 1 tablet by mouth every morning.   NON FORMULARY Take 10 mg by mouth daily. CBD OIL   omeprazole 20 MG capsule Commonly known as: PRILOSEC Take 1 capsule (20 mg  total) by mouth daily.   ondansetron 8 MG tablet Commonly known as: ZOFRAN Take 1 tablet (8 mg total) by mouth every 8 (eight) hours as needed for nausea.   pravastatin 20 MG tablet Commonly known as: PRAVACHOL Take 1 tablet (20 mg total) by mouth daily.   PROBIOTIC DAILY PO Take 1 capsule by mouth daily.   prochlorperazine 10 MG tablet Commonly known as: COMPAZINE Take 1 tablet (10 mg total) by mouth every 6 (six) hours as needed for nausea or vomiting.   terazosin 2 MG capsule Commonly known as: HYTRIN Take 1 capsule (2 mg total) by mouth at bedtime.       History (reviewed): Past Medical History:  Diagnosis Date  . Acute pericarditis    a. 04/2013 -adm with CP, elevated CRP. H/o coronary artery calcification on prior CT but nuc was negative, EF 71%.  . Arthritis   . Atrial fibrillation ([place])    a. Isolated episode in the setting of acute pericarditis 04/2013. Was not placed on anticoag.  Marland Kitchen BPH (benign prostatic hyperplasia)   . Cataract   . Diverticulitis 08/16/2013  . Hyperlipidemia    elevated triglycerides  . 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 Pain in joint, pelvic region and thigh 08/01/2013  . PSVT (paroxysmal supraventricular tachycardia) ([place])    Past Surgical History:  Procedure Laterality Date  . CHOLECYSTECTOMY    . EXPLORATORY LAPAROTOMY    . EYE SURGERY Right 2016   cataract  . LUNG LOBECTOMY     right side  . LYMPH NODE BIOPSY     in groin with removal  . MENISCUS REPAIR     right knee  . PROSTATE SURGERY    . REFRACTIVE SURGERY Right    piece of metal removed  .  SUBMANDIBULAR GLAND EXCISION  04/2011  . SUBMANDIBULAR GLAND EXCISION  04/30/2011   Procedure: EXCISION SUBMANDIBULAR GLAND;  Surgeon: Jerrell Belfast, MD;  Location: [facility];  Service: ENT;  Laterality: Left;  WITH DIAGNOSTIC BIOPSY  . TONSILLECTOMY     as a child  . VEIN LIGATION AND STRIPPING     right leg   Family History  Problem Relation Age of Onset  . Heart disease Father   . Heart attack Father        x 3  . Cancer Sister        breast ca  . Cancer Brother        prostate ca  . Heart attack Sister   . Cancer Sister        breast  . Heart disease Brother   . Alzheimer's disease Sister   . Diabetes Sister   . Heart disease Brother   . Diabetes Brother   .  Heart disease Brother   . Heart disease Brother   . Heart disease Brother   . Heart disease Brother   . Heart disease Brother   . Alzheimer's disease Brother   . Diabetes Son   . Anesthesia problems Neg Hx    Social History   Socioeconomic History  . Marital status: Married    Spouse name: [name]  . Number of children: 2  . Years of education: GED  . Highest education level: GED or equivalent  Occupational History  . Occupation: Retired     Comment: Secretary/administrator  . Occupation: Retired     Comment: [employer]  Tobacco Use  . Smoking status: Former Smoker    Packs/day: 1.50    Years: 25.00    Pack years: 37.50    Types: Cigarettes    Start date: 12/12/1962    Quit date: 01/11/1990    Years since quitting: 29.1  . Smokeless tobacco: Never Used  Substance and Sexual Activity  . Alcohol use: No    Alcohol/week: 0.0 standard drinks  . Drug use: No  . Sexual activity: Not Currently  Other Topics Concern  . Not on file  Social History Narrative  . Not on file   Social Determinants of Health   Financial Resource Strain:   . Difficulty of Paying Living Expenses: Not on file  Food Insecurity:   . Worried About Charity fundraiser in the Last Year: Not on file  . Ran Out of Food in the Last Year:  Not on file  Transportation Needs:   . Lack of Transportation (Medical): Not on file  . Lack of Transportation (Non-Medical): Not on file  Physical Activity:   . Days of Exercise per Week: Not on file  . Minutes of Exercise per Session: Not on file  Stress:   . Feeling of Stress : Not on file  Social Connections:   . Frequency of Communication with Friends and Family: Not on file  . Frequency of Social Gatherings with Friends and Family: Not on file  . Attends Religious Services: Not on file  . Active Member of Clubs or Organizations: Not on file  . Attends Archivist Meetings: Not on file  . Marital Status: Not on file    Activities of Daily Living In your present state of health, do you have any difficulty performing the following activities: 02/21/2019 02/09/2019  Hearing? N N  Vision? Y N  Comment wears glasses-gets yearly eye exam-has glaucoma-follows with specialist -  Difficulty concentrating or making decisions? N Y  Walking or climbing stairs? N N  Dressing or bathing? N Y  Doing errands, shopping? N -  Preparing Food and eating ? N -  Using the Toilet? N -  In the past six months, have you accidently leaked urine? Y -  Comment maybe once a week if he drinks a lot of water and is outside and can't make it to the bathroom in time -  Do you have problems with loss of bowel control? N -  Managing your Medications? N -  Managing your Finances? N -  Housekeeping or managing your Housekeeping? N -  Some recent data might be hidden    Patient Education/ Literacy How often do you need to have someone help you when you read instructions, pamphlets, or other written materials from your doctor or pharmacy?: 1 - Never What is the last grade level you completed in school?: GED  Exercise Current Exercise Habits:  The patient does not participate in regular exercise at present, Exercise limited by: Other - see comments;orthopedic condition(s)(Non Hodgkins  Lymphoma)  Diet Patient reports consuming 2 meals a day and 1 snack(s) a day Patient reports that his primary diet is: Regular Patient reports that she does have regular access to food.   Depression Screen PHQ 2/9 Scores 02/21/2019 07/17/2018 02/17/2018 01/17/2018 07/11/2017 06/07/2017 01/14/2017  PHQ - 2 Score 0 0 0 0 0 0 0     Fall Risk Fall Risk  02/21/2019 07/17/2018 02/17/2018 01/17/2018 07/11/2017  Falls in the past year? 0 0 0 0 No     Objective:  Burnett Spray seemed alert and oriented and he participated appropriately during our telephone visit.  Blood Pressure Weight BMI  BP Readings from Last 3 Encounters:  02/13/19 104/64  02/09/19 110/69  02/01/19 114/72   Wt Readings from Last 3 Encounters:  02/13/19 191 lb 3.2 oz (86.7 kg)  02/09/19 182 lb (82.6 kg)  02/01/19 190 lb 3.2 oz (86.3 kg)   BMI Readings from Last 1 Encounters:  02/13/19 24.55 kg/m    *Unable to obtain current vital signs, weight, and BMI due to telephone visit type  Hearing/Vision  . Octavia did not seem to have difficulty with hearing/understanding during the telephone conversation . Reports that he has had a formal eye exam by an eye care professional within the past year . Reports that he has not had a formal hearing evaluation within the past year *Unable to fully assess hearing and vision during telephone visit type  Cognitive Function: 6CIT Screen 02/21/2019  What Year? 0 points  What month? 0 points  What time? 0 points  Count back from 20 0 points  Months in reverse 4 points  Repeat phrase 2 points  Total Score 6   (Normal:0-7, Significant for Dysfunction: >8)  Normal Cognitive Function Screening: No: He refused to try to say the months of the year in reverse order and stated after 4 rounds of Chemo his short term memory isn't what it used to be. Recommend repeating MMSE at next visit with PCP   Immunization & Health Maintenance Record Immunization History  Administered Date(s) Administered  .  Influenza, High Dose Seasonal PF 10/12/2016, 10/01/2017  . Influenza,inj,Quad PF,6+ Mos 09/27/2014, 10/16/2015  . Influenza-Unspecified 09/11/2012, 11/11/2013, 10/12/2016  . Pneumococcal Conjugate-13 03/07/2013  . Pneumococcal Polysaccharide-23 09/25/2012  . Tdap 08/11/2010  . Zoster Recombinat (Shingrix) 02/17/2018, 08/05/2018    Health Maintenance  Topic Date Due  . Hepatitis C Screening  1945-07-11  . OPHTHALMOLOGY EXAM  12/12/1954  . FOOT EXAM  07/12/2018  . INFLUENZA VACCINE  08/12/2018  . HEMOGLOBIN A1C  01/13/2019  . TETANUS/TDAP  08/10/2020  . COLONOSCOPY  02/23/2024  . PNA vac Low Risk Adult  Completed       Assessment  This is a routine wellness examination for Anthony Kelly.  Health Maintenance: Due or Overdue Health Maintenance Due  Topic Date Due  . Hepatitis C Screening  29-Jul-1945  . OPHTHALMOLOGY EXAM  12/12/1954  . FOOT EXAM  07/12/2018  . INFLUENZA VACCINE  08/12/2018  . HEMOGLOBIN A1C  01/13/2019    Anthony Kelly does not need a referral for Community Assistance: Care Management:   no Social Work:    no Prescription Assistance:  no Nutrition/Diabetes Education:  no   Plan:  Personalized Goals Goals Addressed            This Visit's Progress   . DIET - INCREASE WATER INTAKE  Try to drink 6-8 glasses of water daily      Personalized Health Maintenance & Screening Recommendations  He is up to date with all recommended health screenings  Lung Cancer Screening Recommended: no (Low Dose CT Chest recommended if Age 50-80 years, 30 pack-year currently smoking OR have quit w/in past 15 years) Hepatitis C Screening recommended: no HIV Screening recommended: no  Advanced Directives: Written information was not prepared per patient's request.  Referrals & Orders No orders of the defined types were placed in this encounter.   Follow-up Plan . Follow-up with Anthony Kelly, Anthony Kaufmann, MD as planned   I have personally reviewed and noted the  following in the patient's chart:   . Medical and social history . Use of alcohol, tobacco or illicit drugs  . Current medications and supplements . Functional ability and status . Nutritional status . Physical activity . Advanced directives . List of other physicians . Hospitalizations, surgeries, and ER visits in previous 12 months . Vitals . Screenings to include cognitive, depression, and falls . Referrals and appointments  In addition, I have reviewed and discussed with Anthony Kelly certain preventive protocols, quality metrics, and best practice recommendations. A written personalized care plan for preventive services as well as general preventive health recommendations is available and can be mailed to the patient at his request.      Milas Hock, LPN  [id number]

## 2019-02-22 NOTE — Patient Instructions (Signed)

## 2019-02-25 ENCOUNTER — Encounter: Payer: Self-pay | Admitting: Hematology and Oncology

## 2019-02-26 ENCOUNTER — Encounter: Payer: Self-pay | Admitting: Hematology and Oncology

## 2019-02-27 ENCOUNTER — Other Ambulatory Visit: Payer: Self-pay | Admitting: Hematology and Oncology

## 2019-02-27 ENCOUNTER — Other Ambulatory Visit: Payer: Self-pay | Admitting: Pharmacist

## 2019-02-27 ENCOUNTER — Inpatient Hospital Stay: Payer: Medicare Other

## 2019-02-27 ENCOUNTER — Other Ambulatory Visit: Payer: Self-pay

## 2019-02-27 ENCOUNTER — Encounter: Payer: Self-pay | Admitting: Family Medicine

## 2019-02-27 DIAGNOSIS — R42 Dizziness and giddiness: Secondary | ICD-10-CM | POA: Diagnosis not present

## 2019-02-27 DIAGNOSIS — R109 Unspecified abdominal pain: Secondary | ICD-10-CM | POA: Diagnosis not present

## 2019-02-27 DIAGNOSIS — C8280 Other types of follicular lymphoma, unspecified site: Secondary | ICD-10-CM

## 2019-02-27 DIAGNOSIS — I2510 Atherosclerotic heart disease of native coronary artery without angina pectoris: Secondary | ICD-10-CM | POA: Diagnosis not present

## 2019-02-27 DIAGNOSIS — Z9221 Personal history of antineoplastic chemotherapy: Secondary | ICD-10-CM | POA: Diagnosis not present

## 2019-02-27 DIAGNOSIS — C8510 Unspecified B-cell lymphoma, unspecified site: Secondary | ICD-10-CM | POA: Diagnosis not present

## 2019-02-27 DIAGNOSIS — I952 Hypotension due to drugs: Secondary | ICD-10-CM | POA: Diagnosis not present

## 2019-02-27 LAB — CBC WITH DIFFERENTIAL/PLATELET
Abs Immature Granulocytes: 0.02 10*3/uL (ref 0.00–0.07)
Basophils Absolute: 0.1 10*3/uL (ref 0.0–0.1)
Basophils Relative: 1 %
Eosinophils Absolute: 0.1 10*3/uL (ref 0.0–0.5)
Eosinophils Relative: 2 %
HCT: 48.5 % (ref 39.0–52.0)
Hemoglobin: 16.2 g/dL (ref 13.0–17.0)
Immature Granulocytes: 0 %
Lymphocytes Relative: 30 %
Lymphs Abs: 2 10*3/uL (ref 0.7–4.0)
MCH: 29.5 pg (ref 26.0–34.0)
MCHC: 33.4 g/dL (ref 30.0–36.0)
MCV: 88.3 fL (ref 80.0–100.0)
Monocytes Absolute: 0.6 10*3/uL (ref 0.1–1.0)
Monocytes Relative: 9 %
Neutro Abs: 3.9 10*3/uL (ref 1.7–7.7)
Neutrophils Relative %: 58 %
Platelets: 163 10*3/uL (ref 150–400)
RBC: 5.49 MIL/uL (ref 4.22–5.81)
RDW: 12.9 % (ref 11.5–15.5)
WBC: 6.6 10*3/uL (ref 4.0–10.5)
nRBC: 0 % (ref 0.0–0.2)

## 2019-02-27 LAB — COMPREHENSIVE METABOLIC PANEL
ALT: 18 U/L (ref 0–44)
AST: 17 U/L (ref 15–41)
Albumin: 4 g/dL (ref 3.5–5.0)
Alkaline Phosphatase: 57 U/L (ref 38–126)
Anion gap: 7 (ref 5–15)
BUN: 14 mg/dL (ref 8–23)
CO2: 28 mmol/L (ref 22–32)
Calcium: 8.9 mg/dL (ref 8.9–10.3)
Chloride: 104 mmol/L (ref 98–111)
Creatinine, Ser: 1.22 mg/dL (ref 0.61–1.24)
GFR calc Af Amer: >60 mL/min (ref >60)
GFR calc non Af Amer: 58 mL/min — ABNORMAL LOW (ref >60)
Glucose, Bld: 94 mg/dL (ref 70–99)
Potassium: 4.7 mmol/L (ref 3.5–5.1)
Sodium: 139 mmol/L (ref 135–145)
Total Bilirubin: 0.5 mg/dL (ref 0.3–1.2)
Total Protein: 6.7 g/dL (ref 6.5–8.1)

## 2019-02-27 MED ORDER — ACYCLOVIR 400 MG PO TABS: 400.0000 mg | ORAL_TABLET | Freq: Two times a day (BID) | ORAL | 6 refills | Status: DC

## 2019-02-28 ENCOUNTER — Telehealth: Payer: Self-pay | Admitting: *Deleted

## 2019-02-28 NOTE — Telephone Encounter (Signed)
-----   Message from [name], MD sent at 02/27/2019 12:06 PM EST ----- Regarding: pls call and let him know his labs are ok, continue treatment

## 2019-02-28 NOTE — Telephone Encounter (Signed)
Telephone call to patient and advised lab results as directed below. Patient appreciated call and knows to contact this clinic with any further concerns or questions.

## 2019-03-06 ENCOUNTER — Other Ambulatory Visit: Payer: Self-pay

## 2019-03-06 ENCOUNTER — Encounter: Payer: Self-pay | Admitting: Hematology and Oncology

## 2019-03-06 ENCOUNTER — Encounter: Payer: Self-pay | Admitting: Family Medicine

## 2019-03-06 ENCOUNTER — Inpatient Hospital Stay: Payer: Medicare Other

## 2019-03-06 ENCOUNTER — Inpatient Hospital Stay (HOSPITAL_BASED_OUTPATIENT_CLINIC_OR_DEPARTMENT_OTHER): Payer: Medicare Other | Admitting: Hematology and Oncology

## 2019-03-06 DIAGNOSIS — I952 Hypotension due to drugs: Secondary | ICD-10-CM | POA: Insufficient documentation

## 2019-03-06 DIAGNOSIS — R7303 Prediabetes: Secondary | ICD-10-CM

## 2019-03-06 DIAGNOSIS — C8280 Other types of follicular lymphoma, unspecified site: Secondary | ICD-10-CM

## 2019-03-06 DIAGNOSIS — N400 Enlarged prostate without lower urinary tract symptoms: Secondary | ICD-10-CM

## 2019-03-06 DIAGNOSIS — Z9221 Personal history of antineoplastic chemotherapy: Secondary | ICD-10-CM | POA: Diagnosis not present

## 2019-03-06 DIAGNOSIS — E782 Mixed hyperlipidemia: Secondary | ICD-10-CM

## 2019-03-06 DIAGNOSIS — R42 Dizziness and giddiness: Secondary | ICD-10-CM | POA: Diagnosis not present

## 2019-03-06 DIAGNOSIS — I2510 Atherosclerotic heart disease of native coronary artery without angina pectoris: Secondary | ICD-10-CM | POA: Diagnosis not present

## 2019-03-06 DIAGNOSIS — C8510 Unspecified B-cell lymphoma, unspecified site: Secondary | ICD-10-CM | POA: Diagnosis not present

## 2019-03-06 DIAGNOSIS — R109 Unspecified abdominal pain: Secondary | ICD-10-CM | POA: Diagnosis not present

## 2019-03-06 LAB — COMPREHENSIVE METABOLIC PANEL
ALT: 18 U/L (ref 0–44)
AST: 18 U/L (ref 15–41)
Albumin: 4.1 g/dL (ref 3.5–5.0)
Alkaline Phosphatase: 54 U/L (ref 38–126)
Anion gap: 8 (ref 5–15)
BUN: 11 mg/dL (ref 8–23)
CO2: 26 mmol/L (ref 22–32)
Calcium: 9 mg/dL (ref 8.9–10.3)
Chloride: 105 mmol/L (ref 98–111)
Creatinine, Ser: 1.09 mg/dL (ref 0.61–1.24)
GFR calc Af Amer: >60 mL/min (ref >60)
GFR calc non Af Amer: >60 mL/min (ref >60)
Glucose, Bld: 97 mg/dL (ref 70–99)
Potassium: 5 mmol/L (ref 3.5–5.1)
Sodium: 139 mmol/L (ref 135–145)
Total Bilirubin: 0.7 mg/dL (ref 0.3–1.2)
Total Protein: 6.8 g/dL (ref 6.5–8.1)

## 2019-03-06 LAB — CBC WITH DIFFERENTIAL/PLATELET
Abs Immature Granulocytes: 0.02 10*3/uL (ref 0.00–0.07)
Basophils Absolute: 0.1 10*3/uL (ref 0.0–0.1)
Basophils Relative: 1 %
Eosinophils Absolute: 0.2 10*3/uL (ref 0.0–0.5)
Eosinophils Relative: 2 %
HCT: 47 % (ref 39.0–52.0)
Hemoglobin: 16.1 g/dL (ref 13.0–17.0)
Immature Granulocytes: 0 %
Lymphocytes Relative: 26 %
Lymphs Abs: 2 10*3/uL (ref 0.7–4.0)
MCH: 30.3 pg (ref 26.0–34.0)
MCHC: 34.3 g/dL (ref 30.0–36.0)
MCV: 88.3 fL (ref 80.0–100.0)
Monocytes Absolute: 0.7 10*3/uL (ref 0.1–1.0)
Monocytes Relative: 9 %
Neutro Abs: 4.7 10*3/uL (ref 1.7–7.7)
Neutrophils Relative %: 62 %
Platelets: 147 10*3/uL — ABNORMAL LOW (ref 150–400)
RBC: 5.32 MIL/uL (ref 4.22–5.81)
RDW: 13.3 % (ref 11.5–15.5)
WBC: 7.6 10*3/uL (ref 4.0–10.5)
nRBC: 0 % (ref 0.0–0.2)

## 2019-03-06 MED ORDER — ACYCLOVIR 400 MG PO TABS: 400.0000 mg | ORAL_TABLET | Freq: Two times a day (BID) | ORAL | 6 refills | Status: DC

## 2019-03-06 NOTE — Assessment & Plan Note (Signed)
He has significant low blood pressure secondary to medications He is symptomatic with dizziness His blood pressure at home is consistently low I recommend he contact his cardiologist for possibility of medication adjustment

## 2019-03-06 NOTE — Assessment & Plan Note (Signed)
The patient is mildly symptomatic His wife is inquiring whether he can undergo TURP For now, due to ongoing chemotherapy, I recommend him to delay treatment until we have objective assessment of response to therapy in May He will continue medical management for now

## 2019-03-06 NOTE — Assessment & Plan Note (Signed)
So far, he tolerated treatment well without major side effects He will return here once a week to get blood work monitored We will call him with test results I will see him at the end of next month for further follow-up I recommend minimum 3 months of treatment before repeat imaging studies

## 2019-03-06 NOTE — Progress Notes (Signed)
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[place]) So far, he tolerated treatment well without major side effects He will return here once a week to get blood work monitored We will call him with test results I will see him at the end of next month for further follow-up I recommend minimum 3 months of treatment before repeat imaging studies  Hypotension due to drugs He has significant low blood pressure secondary to medications He is symptomatic with dizziness His blood pressure at home is consistently low I recommend he contact his cardiologist for possibility of medication adjustment  Benign prostate hyperplasia The patient is mildly symptomatic His wife is inquiring whether he can undergo TURP For now, due to ongoing chemotherapy, I recommend him to delay treatment until we have objective assessment of response to therapy in May He will continue medical management for now   No orders of the defined types were placed in this encounter.   All questions were answered. The patient knows to call the clinic with any problems, [name], [name], MD 03/06/2019 2:16 PM  INTERVAL HISTORY: Please see below for problem oriented charting. 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   12/12/2003 - 06/01/2004 Chemotherapy   He was treated with R. CHOP chemotherapy which show complete remission. The number of cycles of R. CHOP chemotherapy was unknown.   12/19/2006 Surgery   Lung resection show follicular lymphoma.   01/02/2007 - 09/01/2008 Chemotherapy   The patient was treated with single agent rituximab alone.   01/19/2007 Bone Marrow Biopsy   Bone marrow biopsy was negative.   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   05/08/2013 - 05/11/2013 Hospital Admission   The patient was admitted to the hospital for management of pericarditis. CT scan showed extensive lymphadenopathy.   06/07/2013 Imaging   PET/CT scan showed extensive lymphadenopathy   06/25/2013 Procedure   He has placement of Infuse-a-Port.   06/28/2013 Bone Marrow Biopsy   Bone marrow biopsy is positive for lymphoma involvement.   07/04/2013 - 11/22/2013 Chemotherapy   He is treated with 6 cycles of bendamustine with rituximab.   09/24/2013 Imaging   Repeat PET scan show complete remission.   12/26/2013 Imaging  PEt scan showed complete remission   09/26/2014 Imaging   CT scan of the chest abdomen and pelvis show no evidence of disease   03/26/2015 Imaging   CT scan showed no evidence of lymphoma   04/14/2016 Imaging   CT: Borderline prominent right  hilar and subcarinal lymph nodes, but not appreciably changed. 2. Low-grade but increased central mesenteric stranding with some small mesenteric lymph nodes. This could certainly be inflammatory, 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, [name], volume estimated at 75 cubic cm.   02/15/2017 Imaging   No evidence of recurrent lymphoma or other acute findings.  Stable moderate hiatal hernia.  Colonic diverticulosis, without radiographic evidence of diverticulitis.  Stable mildly enlarged prostate.  Mild emphysema.  Aortic and coronary artery atherosclerosis.   01/31/2019 Imaging   1. Interval development of abdominal and pelvic adenopathy compatible with recurrent lymphoma. 2. Emphysema and aortic atherosclerosis. 3. Multi vessel coronary artery calcifications. 4. Moderate to large hiatal hernia   02/09/2019 Procedure   Successful CT-guided core biopsy of the left retroperitoneal periaortic adenopathy   02/09/2019 Pathology Results   SURGICAL PATHOLOGY  CASE: [id number]  PATIENT: [facility] Kelly  Surgical Pathology Report   Clinical History: Lymphoma; [name], LEFT PARA AORTIC, NEEDLE CORE BIOPSY:  -  Follicular lymphoma  -  See comment   COMMENT:   The biopsy consists of four fragmented lymph node cores with a vaguely nodular proliferation pattern.  The lymphoid population is composed of small to medium lymphocytes with  irregular, cleaved nuclei and scant cytoplasm.  By immunohistochemistry, [name], [name], [name], [name] is low (less than 10%).  Flow cytometry was attempted; however, there was insufficient material for analysis (see [id number]).  Overall, the features are consistent with relapse of the patient's previously diagnosed follicular lymphoma.  Based on the biopsy, this is favored to be a low-grade follicular lymphoma   1/61/0960 -  Chemotherapy   The patient had idelisib for chemotherapy treatment.       REVIEW OF SYSTEMS:   Constitutional: Denies fevers, chills or abnormal weight loss Eyes: Denies blurriness of vision Ears, nose, mouth, throat, and face: Denies mucositis or sore throat Respiratory: Denies cough, dyspnea or wheezes Cardiovascular: Denies palpitation, chest discomfort or lower extremity swelling Gastrointestinal:  Denies nausea, heartburn or change in bowel habits Skin: Denies abnormal skin rashes Lymphatics: Denies new lymphadenopathy or easy bruising Neurological:Denies numbness, tingling or new weaknesses Behavioral/Psych: Mood is stable, no new changes  All other systems were reviewed with the patient and are negative.  I have reviewed the past medical history, past surgical history, social history and family history with the patient and they are unchanged from previous note.  ALLERGIES:  has No Known Allergies.  MEDICATIONS:  Current Outpatient Medications  Medication Sig Dispense Refill  . acyclovir (ZOVIRAX) 400 MG tablet Take 1  tablet (400 mg total) by mouth 2 (two) times daily. 180 tablet 6  . allopurinol (ZYLOPRIM) 300 MG tablet Take 1 tablet (300 mg total) by mouth daily. 30 tablet 2  . brimonidine (ALPHAGAN) 0.2 % ophthalmic solution 1 drop 2 (two)  times daily.    . idelalisib (ZYDELIG) 150 MG tablet Take 1 tablet (150 mg total) by mouth 2 (two) times daily. 60 tablet 11  . LUMIGAN 0.01 % SOLN Place 1 drop into both eyes daily.    . Magnesium 250 MG TABS Take 1 tablet by mouth daily.    . metoprolol succinate (TOPROL XL) 25 MG 24 hr tablet Take 1 tablet (25 mg total) by mouth daily. 90 tablet 3  . Multiple Vitamin (MULTIVITAMIN WITH MINERALS) TABS tablet Take 1 tablet by mouth every morning.     . NON FORMULARY Take 10 mg by mouth daily. CBD OIL    . omeprazole (PRILOSEC) 20 MG capsule Take 1 capsule (20 mg total) by mouth daily. 90 capsule 3  . ondansetron (ZOFRAN) 8 MG tablet Take 1 tablet (8 mg total) by mouth every 8 (eight) hours as needed for nausea. 30 tablet 3  . pravastatin (PRAVACHOL) 20 MG tablet Take 1 tablet (20 mg total) by mouth daily. 90 tablet 3  . Probiotic Product (PROBIOTIC DAILY PO) Take 1 capsule by mouth daily.     . prochlorperazine (COMPAZINE) 10 MG tablet Take 1 tablet (10 mg total) by mouth every 6 (six) hours as needed for nausea or vomiting. 30 tablet 0  . terazosin (HYTRIN) 2 MG capsule Take 1 capsule (2 mg total) by mouth at bedtime. 90 capsule 3   No current facility-administered medications for this visit.    PHYSICAL EXAMINATION: ECOG PERFORMANCE STATUS: 0 - Asymptomatic  Vitals:   03/06/19 1218  BP: 98/66  Pulse: (!) 58  Resp: 18  Temp: 98 F (36.7 C)  SpO2: 97%   Filed Weights   03/06/19 1218  Weight: 189 lb 6.4 oz (85.9 kg)    GENERAL:alert, no distress and comfortable Musculoskeletal:no cyanosis of digits and no clubbing  NEURO: alert & oriented x 3 with fluent speech, no focal motor/sensory deficits  LABORATORY DATA:  I have reviewed the data as listed    Component Value Date/Time   NA 139 03/06/2019 1152   NA 140 07/13/2018 1031   NA 139 04/14/2016 0943   K 5.0 03/06/2019 1152   K 4.6 04/14/2016 0943   CL 105 03/06/2019 1152   CL 104 03/24/2012 1024   CO2 26 03/06/2019  1152   CO2 25 04/14/2016 0943   GLUCOSE 97 03/06/2019 1152   GLUCOSE 111 04/14/2016 0943   GLUCOSE 80 03/24/2012 1024   BUN 11 03/06/2019 1152   BUN 12 07/13/2018 1031   BUN 8.6 04/14/2016 0943   CREATININE 1.09 03/06/2019 1152   CREATININE 1.1 04/14/2016 0943   CALCIUM 9.0 03/06/2019 1152   CALCIUM 9.4 04/14/2016 0943   PROT 6.8 03/06/2019 1152   PROT 6.2 07/13/2018 1031   PROT 6.5 04/14/2016 0943   ALBUMIN 4.1 03/06/2019 1152   ALBUMIN 4.3 07/13/2018 1031   ALBUMIN 3.8 04/14/2016 0943   AST 18 03/06/2019 1152   AST 28 04/14/2016 0943   ALT 18 03/06/2019 1152   ALT 41 04/14/2016 0943   ALKPHOS 54 03/06/2019 1152   ALKPHOS 71 04/14/2016 0943   BILITOT 0.7 03/06/2019 1152   BILITOT 0.6 07/13/2018 1031   BILITOT 0.61 04/14/2016 0943   GFRNONAA >60  03/06/2019 1152   GFRAA >60 03/06/2019 1152    No results found for: SPEP, UPEP  Lab Results  Component Value Date   WBC 7.6 03/06/2019   NEUTROABS 4.7 03/06/2019   HGB 16.1 03/06/2019   HCT 47.0 03/06/2019   MCV 88.3 03/06/2019   PLT 147 (L) 03/06/2019      Chemistry      Component Value Date/Time   NA 139 03/06/2019 1152   NA 140 07/13/2018 1031   NA 139 04/14/2016 0943   K 5.0 03/06/2019 1152   K 4.6 04/14/2016 0943   CL 105 03/06/2019 1152   CL 104 03/24/2012 1024   CO2 26 03/06/2019 1152   CO2 25 04/14/2016 0943   BUN 11 03/06/2019 1152   BUN 12 07/13/2018 1031   BUN 8.6 04/14/2016 0943   CREATININE 1.09 03/06/2019 1152   CREATININE 1.1 04/14/2016 0943      Component Value Date/Time   CALCIUM 9.0 03/06/2019 1152   CALCIUM 9.4 04/14/2016 0943   ALKPHOS 54 03/06/2019 1152   ALKPHOS 71 04/14/2016 0943   AST 18 03/06/2019 1152   AST 28 04/14/2016 0943   ALT 18 03/06/2019 1152   ALT 41 04/14/2016 0943   BILITOT 0.7 03/06/2019 1152   BILITOT 0.6 07/13/2018 1031   BILITOT 0.61 04/14/2016 0943       RADIOGRAPHIC STUDIES: I have personally reviewed the radiological images as listed and agreed with the  findings in the report. CT BIOPSY  Result Date: 02/09/2019 INDICATION: History of B-cell lymphoma, recurrent retroperitoneal periaortic adenopathy EXAM: CT-GUIDED BIOPSY LEFT RETROPERITONEAL PERIAORTIC ADENOPATHY MEDICATIONS: 1% LIDOCAINE LOCAL ANESTHESIA/SEDATION: 2.0 mg IV Versed; 100 mcg IV Fentanyl Moderate Sedation Time:  12 MINUTES The patient was continuously monitored during the procedure by the interventional radiology nurse under my direct supervision. PROCEDURE: The procedure, risks, benefits, and alternatives were explained to the patient. Questions regarding the procedure were encouraged and answered. The patient understands and consents to the procedure. Previous imaging reviewed. Patient position prone. Noncontrast localization CT performed. The left periaortic adenopathy was localized and marked for a posterolateral approach. Under sterile conditions and local anesthesia, a 17 gauge 16.8 cm access was advanced to the left periaortic adenopathy. Needle position confirmed with CT. 1 cm 18 gauge core biopsies obtained. Samples were placed in saline. Needle tract occluded with Gel-Foam. Patient tolerated the procedure well without complication. Vital sign monitoring by nursing staff during the procedure will continue as patient is in the special procedures unit for post procedure observation. FINDINGS: The images document guide needle placement within the left periaortic retroperitoneal adenopathy. Post biopsy images demonstrate no hemorrhage or hematoma. COMPLICATIONS: None immediate. IMPRESSION: Successful CT-guided core biopsy of the left retroperitoneal periaortic adenopathy Electronically Signed   By: [name].  Shick M.D.   On: 02/09/2019 11:03

## 2019-03-07 ENCOUNTER — Telehealth: Payer: Self-pay | Admitting: Hematology and Oncology

## 2019-03-07 NOTE — Telephone Encounter (Signed)
Scheduled 3/2,3/9, 3/16 and 3/30 appts per 2/23 sch msg. [name] confimed dates and times of all appts scheduled.

## 2019-03-08 ENCOUNTER — Telehealth: Payer: Self-pay

## 2019-03-08 ENCOUNTER — Other Ambulatory Visit: Payer: Self-pay | Admitting: Hematology and Oncology

## 2019-03-08 DIAGNOSIS — R1013 Epigastric pain: Secondary | ICD-10-CM

## 2019-03-08 MED ORDER — OXYCODONE HCL 5 MG PO TABS: 5.0000 mg | ORAL_TABLET | ORAL | 0 refills | Status: DC | PRN

## 2019-03-08 NOTE — Telephone Encounter (Signed)
done

## 2019-03-08 NOTE — Telephone Encounter (Signed)
Called and given below message. He verbalized understanding. He lives in [place] and would like Rx sent to [facility] for convenience. Reviewed narcotic refill policy. He is going to start with 1/2 tablet and try to not use it. He verbalized understanding.

## 2019-03-08 NOTE — Telephone Encounter (Signed)
-----   Message from [name], MD sent at 03/08/2019  9:59 AM EST ----- Regarding: pain managment I recommend trial of oxycodone Which pharmacy? Warn about risk of sedation and constipation Remind him of narcotic refill policy

## 2019-03-13 ENCOUNTER — Telehealth: Payer: Self-pay

## 2019-03-13 ENCOUNTER — Inpatient Hospital Stay: Payer: Medicare Other | Attending: Hematology and Oncology

## 2019-03-13 ENCOUNTER — Other Ambulatory Visit: Payer: Self-pay

## 2019-03-13 DIAGNOSIS — Z9221 Personal history of antineoplastic chemotherapy: Secondary | ICD-10-CM | POA: Diagnosis not present

## 2019-03-13 DIAGNOSIS — G893 Neoplasm related pain (acute) (chronic): Secondary | ICD-10-CM | POA: Insufficient documentation

## 2019-03-13 DIAGNOSIS — C8295 Follicular lymphoma, unspecified, lymph nodes of inguinal region and lower limb: Secondary | ICD-10-CM | POA: Insufficient documentation

## 2019-03-13 DIAGNOSIS — C7800 Secondary malignant neoplasm of unspecified lung: Secondary | ICD-10-CM | POA: Diagnosis not present

## 2019-03-13 DIAGNOSIS — Z79899 Other long term (current) drug therapy: Secondary | ICD-10-CM | POA: Diagnosis not present

## 2019-03-13 DIAGNOSIS — I2510 Atherosclerotic heart disease of native coronary artery without angina pectoris: Secondary | ICD-10-CM | POA: Diagnosis not present

## 2019-03-13 DIAGNOSIS — M545 Low back pain: Secondary | ICD-10-CM | POA: Diagnosis not present

## 2019-03-13 DIAGNOSIS — C8280 Other types of follicular lymphoma, unspecified site: Secondary | ICD-10-CM

## 2019-03-13 LAB — COMPREHENSIVE METABOLIC PANEL
ALT: 16 U/L (ref 0–44)
AST: 19 U/L (ref 15–41)
Albumin: 4.2 g/dL (ref 3.5–5.0)
Alkaline Phosphatase: 55 U/L (ref 38–126)
Anion gap: 7 (ref 5–15)
BUN: 13 mg/dL (ref 8–23)
CO2: 29 mmol/L (ref 22–32)
Calcium: 9.5 mg/dL (ref 8.9–10.3)
Chloride: 104 mmol/L (ref 98–111)
Creatinine, Ser: 1.17 mg/dL (ref 0.61–1.24)
GFR calc Af Amer: >60 mL/min (ref >60)
GFR calc non Af Amer: >60 mL/min (ref >60)
Glucose, Bld: 91 mg/dL (ref 70–99)
Potassium: 4.9 mmol/L (ref 3.5–5.1)
Sodium: 140 mmol/L (ref 135–145)
Total Bilirubin: 0.8 mg/dL (ref 0.3–1.2)
Total Protein: 7.3 g/dL (ref 6.5–8.1)

## 2019-03-13 LAB — CBC WITH DIFFERENTIAL/PLATELET
Abs Immature Granulocytes: 0.01 10*3/uL (ref 0.00–0.07)
Basophils Absolute: 0.1 10*3/uL (ref 0.0–0.1)
Basophils Relative: 1 %
Eosinophils Absolute: 0.3 10*3/uL (ref 0.0–0.5)
Eosinophils Relative: 4 %
HCT: 48.8 % (ref 39.0–52.0)
Hemoglobin: 16.2 g/dL (ref 13.0–17.0)
Immature Granulocytes: 0 %
Lymphocytes Relative: 25 %
Lymphs Abs: 1.6 10*3/uL (ref 0.7–4.0)
MCH: 30 pg (ref 26.0–34.0)
MCHC: 33.2 g/dL (ref 30.0–36.0)
MCV: 90.4 fL (ref 80.0–100.0)
Monocytes Absolute: 0.7 10*3/uL (ref 0.1–1.0)
Monocytes Relative: 11 %
Neutro Abs: 3.8 10*3/uL (ref 1.7–7.7)
Neutrophils Relative %: 59 %
Platelets: 151 10*3/uL (ref 150–400)
RBC: 5.4 MIL/uL (ref 4.22–5.81)
RDW: 13.6 % (ref 11.5–15.5)
WBC: 6.3 10*3/uL (ref 4.0–10.5)
nRBC: 0 % (ref 0.0–0.2)

## 2019-03-13 NOTE — Telephone Encounter (Signed)
Called and given below message. He verbalized understanding. 

## 2019-03-13 NOTE — Telephone Encounter (Signed)
-----   Message from [name], MD sent at 03/13/2019 11:28 AM EST ----- Regarding: pls call him, labs are normal, continue chemo

## 2019-03-15 MED FILL — ALLOPURINOL 300 MG TABS: 300 | 30 days supply | Qty: 30 | Fill #1

## 2019-03-15 MED FILL — ZYDELIG 150 MG TABLET: 150 | 30 days supply | Qty: 60 | Fill #1

## 2019-03-16 ENCOUNTER — Encounter: Payer: Self-pay | Admitting: Hematology and Oncology

## 2019-03-18 ENCOUNTER — Encounter: Payer: Self-pay | Admitting: Hematology and Oncology

## 2019-03-20 ENCOUNTER — Encounter: Payer: Self-pay | Admitting: Hematology and Oncology

## 2019-03-20 ENCOUNTER — Other Ambulatory Visit: Payer: Self-pay

## 2019-03-20 ENCOUNTER — Inpatient Hospital Stay (HOSPITAL_BASED_OUTPATIENT_CLINIC_OR_DEPARTMENT_OTHER): Payer: Medicare Other | Admitting: Hematology and Oncology

## 2019-03-20 ENCOUNTER — Inpatient Hospital Stay: Payer: Medicare Other

## 2019-03-20 VITALS — BP 139/86 | HR 82 | Temp 98.0°F | Resp 17 | Ht 74.0 in | Wt 192.7 lb

## 2019-03-20 DIAGNOSIS — G893 Neoplasm related pain (acute) (chronic): Secondary | ICD-10-CM

## 2019-03-20 DIAGNOSIS — M545 Low back pain: Secondary | ICD-10-CM | POA: Diagnosis not present

## 2019-03-20 DIAGNOSIS — C8280 Other types of follicular lymphoma, unspecified site: Secondary | ICD-10-CM

## 2019-03-20 DIAGNOSIS — C7800 Secondary malignant neoplasm of unspecified lung: Secondary | ICD-10-CM | POA: Diagnosis not present

## 2019-03-20 DIAGNOSIS — I2510 Atherosclerotic heart disease of native coronary artery without angina pectoris: Secondary | ICD-10-CM | POA: Diagnosis not present

## 2019-03-20 DIAGNOSIS — Z9221 Personal history of antineoplastic chemotherapy: Secondary | ICD-10-CM | POA: Diagnosis not present

## 2019-03-20 DIAGNOSIS — C8295 Follicular lymphoma, unspecified, lymph nodes of inguinal region and lower limb: Secondary | ICD-10-CM | POA: Diagnosis not present

## 2019-03-20 LAB — CBC WITH DIFFERENTIAL/PLATELET
Abs Immature Granulocytes: 0.01 10*3/uL (ref 0.00–0.07)
Basophils Absolute: 0.1 10*3/uL (ref 0.0–0.1)
Basophils Relative: 1 %
Eosinophils Absolute: 0.2 10*3/uL (ref 0.0–0.5)
Eosinophils Relative: 4 %
HCT: 47.7 % (ref 39.0–52.0)
Hemoglobin: 15.9 g/dL (ref 13.0–17.0)
Immature Granulocytes: 0 %
Lymphocytes Relative: 28 %
Lymphs Abs: 1.5 10*3/uL (ref 0.7–4.0)
MCH: 30.1 pg (ref 26.0–34.0)
MCHC: 33.3 g/dL (ref 30.0–36.0)
MCV: 90.2 fL (ref 80.0–100.0)
Monocytes Absolute: 0.6 10*3/uL (ref 0.1–1.0)
Monocytes Relative: 11 %
Neutro Abs: 2.9 10*3/uL (ref 1.7–7.7)
Neutrophils Relative %: 56 %
Platelets: 157 10*3/uL (ref 150–400)
RBC: 5.29 MIL/uL (ref 4.22–5.81)
RDW: 13.9 % (ref 11.5–15.5)
WBC: 5.3 10*3/uL (ref 4.0–10.5)
nRBC: 0 % (ref 0.0–0.2)

## 2019-03-20 LAB — COMPREHENSIVE METABOLIC PANEL
ALT: 15 U/L (ref 0–44)
AST: 19 U/L (ref 15–41)
Albumin: 4.1 g/dL (ref 3.5–5.0)
Alkaline Phosphatase: 56 U/L (ref 38–126)
Anion gap: 10 (ref 5–15)
BUN: 12 mg/dL (ref 8–23)
CO2: 27 mmol/L (ref 22–32)
Calcium: 9.4 mg/dL (ref 8.9–10.3)
Chloride: 106 mmol/L (ref 98–111)
Creatinine, Ser: 1.1 mg/dL (ref 0.61–1.24)
GFR calc Af Amer: >60 mL/min (ref >60)
GFR calc non Af Amer: >60 mL/min (ref >60)
Glucose, Bld: 93 mg/dL (ref 70–99)
Potassium: 5.1 mmol/L (ref 3.5–5.1)
Sodium: 143 mmol/L (ref 135–145)
Total Bilirubin: 0.6 mg/dL (ref 0.3–1.2)
Total Protein: 6.9 g/dL (ref 6.5–8.1)

## 2019-03-20 MED ORDER — PREDNISONE 20 MG PO TABS: 40.0000 mg | ORAL_TABLET | Freq: Every day | ORAL | 0 refills | Status: DC

## 2019-03-20 NOTE — Progress Notes (Signed)
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, 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, [name], [name], MD 03/20/2019 11:05 AM  INTERVAL HISTORY: Please see below for problem oriented charting. He returns with his wife for further follow-up Over the past few weeks, [name], MD on 09/26/2013  9:15 AM   Summary: Stage IV      Follicular low grade B-cell lymphoma ([place])  12/10/2003 Surgery   Inguinal lymph node biopsy showed follicular lymphoma.   12/12/2003 - 06/01/2004 Chemotherapy   He was treated with R. CHOP chemotherapy which show complete remission. The number of cycles of R. CHOP chemotherapy was unknown.   12/19/2006 Surgery   Lung resection show follicular lymphoma.   01/02/2007 - 09/01/2008 Chemotherapy   The patient was treated with single agent rituximab alone.   01/19/2007 Bone Marrow Biopsy   Bone marrow biopsy was negative.   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   05/08/2013 - 05/11/2013 Hospital Admission   The patient was admitted to the hospital for management of pericarditis. CT scan showed extensive lymphadenopathy.   06/07/2013 Imaging   PET/CT scan showed extensive lymphadenopathy   06/25/2013 Procedure   He has placement of Infuse-a-Port.   06/28/2013 Bone Marrow Biopsy   Bone marrow biopsy is positive for lymphoma involvement.   07/04/2013 - 11/22/2013 Chemotherapy   He is treated with 6 cycles of bendamustine with rituximab.   09/24/2013 Imaging   Repeat PET scan show complete remission.  12/26/2013 Imaging   PEt scan showed complete remission   09/26/2014 Imaging   CT scan of the chest abdomen and pelvis show no evidence of disease   03/26/2015 Imaging   CT scan showed no evidence of lymphoma   04/14/2016 Imaging   CT: Borderline prominent right hilar and subcarinal lymph nodes, [name], 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, [name], volume estimated at 75 cubic cm.   02/15/2017 Imaging   No evidence of recurrent lymphoma or other acute findings.  Stable moderate hiatal hernia.  Colonic diverticulosis, without radiographic evidence of diverticulitis.  Stable mildly enlarged prostate.  Mild emphysema.  Aortic and coronary artery atherosclerosis.   01/31/2019 Imaging   1. Interval development of abdominal and pelvic adenopathy compatible with recurrent lymphoma. 2. Emphysema and aortic atherosclerosis. 3. Multi vessel coronary artery calcifications. 4. Moderate to large hiatal hernia   02/09/2019 Procedure   Successful CT-guided core biopsy of the left retroperitoneal periaortic adenopathy   02/09/2019 Pathology Results   SURGICAL PATHOLOGY  CASE: [id number]  PATIENT: Anthony Kelly  Surgical Pathology Report   Clinical History: Lymphoma; [name], LEFT PARA AORTIC, [name], [name], [name], [name], [name], [name] is low (less than 10%).  Flow cytometry was attempted; however, there was insufficient material for analysis (see [id number]).  Overall, the features are consistent with relapse of the patient's previously diagnosed follicular lymphoma.  Based on the biopsy, this is favored to be a low-grade follicular lymphoma   06/12/6071 -  Chemotherapy   The patient had idelisib for chemotherapy treatment.       REVIEW OF SYSTEMS:   Constitutional: Denies fevers, chills or abnormal weight loss Eyes: Denies blurriness of vision Ears, nose, mouth, throat, and face: Denies mucositis or sore throat Respiratory: Denies cough, dyspnea or wheezes Cardiovascular: Denies palpitation, chest discomfort or lower extremity swelling Gastrointestinal:  Denies nausea, heartburn or change in bowel habits Skin: Denies abnormal skin rashes Lymphatics: Denies new lymphadenopathy or easy bruising Neurological:Denies numbness, tingling or new weaknesses Behavioral/Psych: Mood is stable, no new changes  All other systems were reviewed with the patient and are negative.  I have reviewed the past medical history, past surgical history, social history and family history with the patient and they are unchanged from previous note.  ALLERGIES:  has No Known Allergies.  MEDICATIONS:  Current Outpatient Medications  Medication Sig Dispense Refill  . acyclovir (ZOVIRAX) 400  MG tablet Take 1 tablet (400 mg total) by mouth 2 (two) times daily. 180 tablet 6  . allopurinol (ZYLOPRIM) 300 MG tablet Take 1 tablet (300 mg total) by mouth daily. 30 tablet 2  . brimonidine (ALPHAGAN) 0.2 % ophthalmic solution 1 drop 2 (two) times daily.    . idelalisib (ZYDELIG) 150 MG  tablet Take 1 tablet (150 mg total) by mouth 2 (two) times daily. 60 tablet 11  . LUMIGAN 0.01 % SOLN Place 1 drop into both eyes daily.    . Magnesium 250 MG TABS Take 1 tablet by mouth daily.    . Multiple Vitamin (MULTIVITAMIN WITH MINERALS) TABS tablet Take 1 tablet by mouth every morning.     . NON FORMULARY Take 10 mg by mouth daily. CBD OIL    . omeprazole (PRILOSEC) 20 MG capsule Take 1 capsule (20 mg total) by mouth daily. 90 capsule 3  . ondansetron (ZOFRAN) 8 MG tablet Take 1 tablet (8 mg total) by mouth every 8 (eight) hours as needed for nausea. 30 tablet 3  . oxyCODONE (OXY IR/ROXICODONE) 5 MG immediate release tablet Take 1 tablet (5 mg total) by mouth every 4 (four) hours as needed for severe pain. 30 tablet 0  . pravastatin (PRAVACHOL) 20 MG tablet Take 1 tablet (20 mg total) by mouth daily. 90 tablet 3  . predniSONE (DELTASONE) 20 MG tablet Take 2 tablets (40 mg total) by mouth daily with breakfast. 14 tablet 0  . Probiotic Product (PROBIOTIC DAILY PO) Take 1 capsule by mouth daily.     . prochlorperazine (COMPAZINE) 10 MG tablet Take 1 tablet (10 mg total) by mouth every 6 (six) hours as needed for nausea or vomiting. 30 tablet 0  . terazosin (HYTRIN) 2 MG capsule Take 1 capsule (2 mg total) by mouth at bedtime. 90 capsule 3   No current facility-administered medications for this visit.    PHYSICAL EXAMINATION: ECOG PERFORMANCE STATUS: 1 - Symptomatic but completely ambulatory  Vitals:   03/20/19 1023  BP: 139/86  Pulse: 82  Resp: 17  Temp: 98 F (36.7 C)  SpO2: 99%   Filed Weights   03/20/19 1023  Weight: 192 lb 11.2 oz (87.4 kg)    GENERAL:alert, no distress and comfortable NEURO: alert & oriented x 3 with fluent speech, no focal motor/sensory deficits  LABORATORY DATA:  I have reviewed the data as listed    Component Value Date/Time   NA 143 03/20/2019 1005   NA 140 07/13/2018 1031   NA 139 04/14/2016 0943   K 5.1 03/20/2019 1005   K 4.6 04/14/2016  0943   CL 106 03/20/2019 1005   CL 104 03/24/2012 1024   CO2 27 03/20/2019 1005   CO2 25 04/14/2016 0943   GLUCOSE 93 03/20/2019 1005   GLUCOSE 111 04/14/2016 0943   GLUCOSE 80 03/24/2012 1024   BUN 12 03/20/2019 1005   BUN 12 07/13/2018 1031   BUN 8.6 04/14/2016 0943   CREATININE 1.10 03/20/2019 1005   CREATININE 1.1 04/14/2016 0943   CALCIUM 9.4 03/20/2019 1005   CALCIUM 9.4 04/14/2016 0943   PROT 6.9 03/20/2019 1005   PROT 6.2 07/13/2018 1031   PROT 6.5 04/14/2016 0943   ALBUMIN 4.1 03/20/2019 1005   ALBUMIN 4.3 07/13/2018 1031   ALBUMIN 3.8 04/14/2016 0943   AST 19 03/20/2019 1005   AST 28 04/14/2016 0943   ALT 15 03/20/2019 1005   ALT 41 04/14/2016 0943   ALKPHOS 56 03/20/2019 1005  ALKPHOS 71 04/14/2016 0943   BILITOT 0.6 03/20/2019 1005   BILITOT 0.6 07/13/2018 1031   BILITOT 0.61 04/14/2016 0943   GFRNONAA >60 03/20/2019 1005   GFRAA >60 03/20/2019 1005    No results found for: SPEP, UPEP  Lab Results  Component Value Date   WBC 5.3 03/20/2019   NEUTROABS 2.9 03/20/2019   HGB 15.9 03/20/2019   HCT 47.7 03/20/2019   MCV 90.2 03/20/2019   PLT 157 03/20/2019      Chemistry      Component Value Date/Time   NA 143 03/20/2019 1005   NA 140 07/13/2018 1031   NA 139 04/14/2016 0943   K 5.1 03/20/2019 1005   K 4.6 04/14/2016 0943   CL 106 03/20/2019 1005   CL 104 03/24/2012 1024   CO2 27 03/20/2019 1005   CO2 25 04/14/2016 0943   BUN 12 03/20/2019 1005   BUN 12 07/13/2018 1031   BUN 8.6 04/14/2016 0943   CREATININE 1.10 03/20/2019 1005   CREATININE 1.1 04/14/2016 0943      Component Value Date/Time   CALCIUM 9.4 03/20/2019 1005   CALCIUM 9.4 04/14/2016 0943   ALKPHOS 56 03/20/2019 1005   ALKPHOS 71 04/14/2016 0943   AST 19 03/20/2019 1005   AST 28 04/14/2016 0943   ALT 15 03/20/2019 1005   ALT 41 04/14/2016 0943   BILITOT 0.6 03/20/2019 1005   BILITOT 0.6 07/13/2018 1031   BILITOT 0.61 04/14/2016 0943       RADIOGRAPHIC STUDIES: I have  reviewed his previous CT imaging with the patient and his wife

## 2019-03-20 NOTE — Assessment & Plan Note (Signed)
He has intermittent lower back pain consistent with the location of his disease I told him and his wife is too soon to order CT imaging to assess response to therapy I recommend low-dose prednisone therapy for a week and he agreed In the meantime, he tolerated idelalisib very well without side effects

## 2019-03-20 NOTE — Assessment & Plan Note (Signed)
He has cancer associated pain at the location of disease I recommend low-dose prednisone therapy for week along with oxycodone as needed for pain management and he agreed The risk, benefits, side effects of prednisone and oxycodone were fully discussed and he is in agreement to proceed We discussed briefly narcotic refill policy

## 2019-03-22 ENCOUNTER — Other Ambulatory Visit: Payer: Self-pay

## 2019-03-22 ENCOUNTER — Other Ambulatory Visit: Payer: Medicare Other

## 2019-03-22 DIAGNOSIS — N400 Enlarged prostate without lower urinary tract symptoms: Secondary | ICD-10-CM

## 2019-03-22 DIAGNOSIS — Z1159 Encounter for screening for other viral diseases: Secondary | ICD-10-CM

## 2019-03-22 DIAGNOSIS — R7303 Prediabetes: Secondary | ICD-10-CM

## 2019-03-22 DIAGNOSIS — E782 Mixed hyperlipidemia: Secondary | ICD-10-CM

## 2019-03-22 LAB — BAYER DCA HB A1C WAIVED: HB A1C (BAYER DCA - WAIVED): 5.9 % (ref <7.0)

## 2019-03-23 LAB — CMP14+EGFR
ALT: 15 IU/L (ref 0–44)
AST: 24 IU/L (ref 0–40)
Albumin/Globulin Ratio: 2.3 — ABNORMAL HIGH (ref 1.2–2.2)
Albumin: 4.6 g/dL (ref 3.7–4.7)
Alkaline Phosphatase: 61 IU/L (ref 39–117)
BUN/Creatinine Ratio: 14 (ref 10–24)
BUN: 15 mg/dL (ref 8–27)
Bilirubin Total: 0.4 mg/dL (ref 0.0–1.2)
CO2: 24 mmol/L (ref 20–29)
Calcium: 9.4 mg/dL (ref 8.6–10.2)
Chloride: 101 mmol/L (ref 96–106)
Creatinine, Ser: 1.11 mg/dL (ref 0.76–1.27)
GFR calc Af Amer: 75 mL/min/{1.73_m2} (ref >59)
GFR calc non Af Amer: 65 mL/min/{1.73_m2} (ref >59)
Globulin, Total: 2 g/dL (ref 1.5–4.5)
Glucose: 86 mg/dL (ref 65–99)
Potassium: 4.8 mmol/L (ref 3.5–5.2)
Sodium: 142 mmol/L (ref 134–144)
Total Protein: 6.6 g/dL (ref 6.0–8.5)

## 2019-03-23 LAB — LIPID PANEL
Chol/HDL Ratio: 2.2 ratio (ref 0.0–5.0)
Cholesterol, Total: 137 mg/dL (ref 100–199)
HDL: 61 mg/dL (ref >39)
LDL Chol Calc (NIH): 61 mg/dL (ref 0–99)
Triglycerides: 75 mg/dL (ref 0–149)
VLDL Cholesterol Cal: 15 mg/dL (ref 5–40)

## 2019-03-23 LAB — PSA, TOTAL AND FREE
PSA, Free Pct: 30 %
PSA, Free: 0.42 ng/mL
Prostate Specific Ag, Serum: 1.4 ng/mL (ref 0.0–4.0)

## 2019-03-23 LAB — HEPATITIS C ANTIBODY: Hep C Virus Ab: <0.1 s/co ratio (ref 0.0–0.9)

## 2019-03-26 ENCOUNTER — Telehealth: Payer: Self-pay

## 2019-03-26 ENCOUNTER — Other Ambulatory Visit: Payer: Self-pay

## 2019-03-26 ENCOUNTER — Ambulatory Visit (INDEPENDENT_AMBULATORY_CARE_PROVIDER_SITE_OTHER): Payer: Medicare Other | Admitting: Family Medicine

## 2019-03-26 ENCOUNTER — Encounter: Payer: Self-pay | Admitting: Family Medicine

## 2019-03-26 VITALS — BP 116/82 | HR 86 | Temp 99.1°F | Ht 74.0 in | Wt 190.0 lb

## 2019-03-26 DIAGNOSIS — R7303 Prediabetes: Secondary | ICD-10-CM | POA: Diagnosis not present

## 2019-03-26 DIAGNOSIS — G893 Neoplasm related pain (acute) (chronic): Secondary | ICD-10-CM

## 2019-03-26 DIAGNOSIS — E782 Mixed hyperlipidemia: Secondary | ICD-10-CM

## 2019-03-26 NOTE — Telephone Encounter (Signed)
Wife called and left a message to call her.   Called back and spoke to them both on speaker phone. He took the last prednisone this morning. The pain in right side of his lower back is better. Now he is complaining of right lower rib cage pain. He said the pain medication is not helping. He is asking about a scan?

## 2019-03-26 NOTE — Progress Notes (Signed)
BP 116/82   Pulse 86   Temp 99.1 F (37.3 C)   Ht 6' 2" (1.88 m)   Wt 190 lb (86.2 kg)   SpO2 97%   BMI 24.39 kg/m    Subjective:   Patient ID: Anthony Kelly, male    DOB: 1945-06-21, 74 y.o.   MRN: [id number]  HPI: Anthony Kelly is a 74 y.o. male presenting on 03/26/2019 for Medical Management of Chronic Issues, Back Pain (mid to lower back), and skin lesion (left jawline. Scabs and painful)   HPI Patient has cancer with some metastasis that is working with oncology for and get some at his lower back and then he has some in his mid back around his rib on the right.  He says this pain in his lower back was helped by the oxycodone that the oncologist gave him but the one in his mid back has not been helpful as much.  He does not initially want anything today but he just wanted it looked at.  He has a finger that pops on his left hand is not painful but just pops sometimes when he moves it back and forth, likely arthritis  Prediabetes Patient comes in today for recheck of his diabetes. Patient has been currently taking no medication and has been under control, A1c was 5.9 just couple days ago.. Patient is not currently on an ACE inhibitor/ARB. Patient has not seen an ophthalmologist this year. Patient denies any issues with their feet.   Hyperlipidemia Patient is coming in for recheck of his hyperlipidemia. The patient is currently taking pravastatin. They deny any issues with myalgias or history of liver damage from it. They deny any focal numbness or weakness or chest pain.   Relevant past medical, surgical, family and social history reviewed and updated as indicated. Interim medical history since our last visit reviewed. Allergies and medications reviewed and updated.  Review of Systems  Constitutional: Negative for chills and fever.  Eyes: Negative for visual disturbance.  Respiratory: Negative for shortness of breath and wheezing.   Cardiovascular: Negative for chest pain and  leg swelling.  Musculoskeletal: Positive for arthralgias and back pain. Negative for gait problem.  Skin: Negative for rash.  All other systems reviewed and are negative.   Per HPI unless specifically indicated above   Allergies as of 03/26/2019   No Known Allergies     Medication List       Accurate as of March 26, 2019  2:46 PM. If you have any questions, ask your nurse or doctor.        STOP taking these medications   terazosin 2 MG capsule Commonly known as: HYTRIN Stopped by: [name] Dettinger, MD     TAKE these medications   acyclovir 400 MG tablet Commonly known as: ZOVIRAX Take 1 tablet (400 mg total) by mouth 2 (two) times daily.   allopurinol 300 MG tablet Commonly known as: ZYLOPRIM Take 1 tablet (300 mg total) by mouth daily.   brimonidine 0.2 % ophthalmic solution Commonly known as: ALPHAGAN 1 drop 2 (two) times daily.   idelalisib 150 MG tablet Commonly known as: ZYDELIG Take 1 tablet (150 mg total) by mouth 2 (two) times daily.   Lumigan 0.01 % Soln Generic drug: bimatoprost Place 1 drop into both eyes daily.   Magnesium 250 MG Tabs Take 1 tablet by mouth daily.   multivitamin with minerals Tabs tablet Take 1 tablet by mouth every morning.   NON FORMULARY Take 10 mg  by mouth daily. CBD OIL   NON FORMULARY Take by mouth daily. Beet Root   omeprazole 20 MG capsule Commonly known as: PRILOSEC Take 1 capsule (20 mg total) by mouth daily.   ondansetron 8 MG tablet Commonly known as: ZOFRAN Take 1 tablet (8 mg total) by mouth every 8 (eight) hours as needed for nausea.   oxyCODONE 5 MG immediate release tablet Commonly known as: Oxy IR/ROXICODONE Take 1 tablet (5 mg total) by mouth every 4 (four) hours as needed for severe pain.   pravastatin 20 MG tablet Commonly known as: PRAVACHOL Take 1 tablet (20 mg total) by mouth daily.   predniSONE 20 MG tablet Commonly known as: DELTASONE Take 2 tablets (40 mg total) by mouth daily with  breakfast.   PROBIOTIC DAILY PO Take 1 capsule by mouth daily.   prochlorperazine 10 MG tablet Commonly known as: COMPAZINE Take 1 tablet (10 mg total) by mouth every 6 (six) hours as needed for nausea or vomiting.        Objective:   BP 116/82   Pulse 86   Temp 99.1 F (37.3 C)   Ht 6' 2" (1.88 m)   Wt 190 lb (86.2 kg)   SpO2 97%   BMI 24.39 kg/m   Wt Readings from Last 3 Encounters:  03/26/19 190 lb (86.2 kg)  03/20/19 192 lb 11.2 oz (87.4 kg)  03/06/19 189 lb 6.4 oz (85.9 kg)    Physical Exam Vitals and nursing note reviewed.  Constitutional:      General: He is not in acute distress.    Appearance: He is well-developed. He is not diaphoretic.  Eyes:     General: No scleral icterus.    Conjunctiva/sclera: Conjunctivae normal.  Neck:     Thyroid: No thyromegaly.  Cardiovascular:     Rate and Rhythm: Normal rate and regular rhythm.     Heart sounds: Normal heart sounds. No murmur.  Pulmonary:     Effort: Pulmonary effort is normal. No respiratory distress.     Breath sounds: Normal breath sounds. No wheezing.  Musculoskeletal:        General: Normal range of motion.     Cervical back: Neck supple.  Lymphadenopathy:     Cervical: No cervical adenopathy.  Skin:    General: Skin is warm and dry.     Findings: No rash.  Neurological:     Mental Status: He is alert and oriented to person, place, and time.     Coordination: Coordination normal.  Psychiatric:        Behavior: Behavior normal.     Results for orders placed or performed in visit on 03/22/19  PSA, total and free  Result Value Ref Range   Prostate Specific Ag, Serum 1.4 0.0 - 4.0 ng/mL   PSA, Free 0.42 N/A ng/mL   PSA, Free Pct 30.0 %  Lipid panel  Result Value Ref Range   Cholesterol, Total 137 100 - 199 mg/dL   Triglycerides 75 0 - 149 mg/dL   HDL 61 >39 mg/dL   VLDL Cholesterol Cal 15 5 - 40 mg/dL   LDL Chol Calc (NIH) 61 0 - 99 mg/dL   Chol/HDL Ratio 2.2 0.0 - 5.0 ratio  Bayer DCA  Hb A1c Waived  Result Value Ref Range   HB A1C (BAYER DCA - WAIVED) 5.9 <7.0 %  CMP14+EGFR  Result Value Ref Range   Glucose 86 65 - 99 mg/dL   BUN 15 8 - 27 mg/dL  Creatinine, Ser 1.11 0.76 - 1.27 mg/dL   GFR calc non Af Amer 65 >59 mL/min/1.73   GFR calc Af Amer 75 >59 mL/min/1.73   BUN/Creatinine Ratio 14 10 - 24   Sodium 142 134 - 144 mmol/L   Potassium 4.8 3.5 - 5.2 mmol/L   Chloride 101 96 - 106 mmol/L   CO2 24 20 - 29 mmol/L   Calcium 9.4 8.6 - 10.2 mg/dL   Total Protein 6.6 6.0 - 8.5 g/dL   Albumin 4.6 3.7 - 4.7 g/dL   Globulin, Total 2.0 1.5 - 4.5 g/dL   Albumin/Globulin Ratio 2.3 (H) 1.2 - 2.2   Bilirubin Total 0.4 0.0 - 1.2 mg/dL   Alkaline Phosphatase 61 39 - 117 IU/L   AST 24 0 - 40 IU/L   ALT 15 0 - 44 IU/L  Hepatitis C antibody  Result Value Ref Range   Hep C Virus Ab <0.1 0.0 - 0.9 s/co ratio    Assessment & Plan:   Problem List Items Addressed This Visit      Other   Hyperlipidemia   Prediabetes - Primary   Cancer associated pain      Continue current medication, he will continue work with oncology, he said he prefer to wait till oncology till tomorrow to discuss the pains before starting anything but we discussed possibly gabapentin to help or Cymbalta Follow up plan: Return in about 6 months (around 09/26/2019), or if symptoms worsen or fail to improve, for Hyperlipidemia and prediabetes.  Counseling provided for all of the vaccine components No orders of the defined types were placed in this encounter.   Caryl Pina, MD [facility] 03/26/2019, 2:46 PM

## 2019-03-27 ENCOUNTER — Inpatient Hospital Stay: Payer: Medicare Other

## 2019-03-27 ENCOUNTER — Other Ambulatory Visit: Payer: Self-pay

## 2019-03-27 ENCOUNTER — Other Ambulatory Visit: Payer: Self-pay | Admitting: Hematology and Oncology

## 2019-03-27 DIAGNOSIS — M545 Low back pain: Secondary | ICD-10-CM | POA: Diagnosis not present

## 2019-03-27 DIAGNOSIS — C7800 Secondary malignant neoplasm of unspecified lung: Secondary | ICD-10-CM | POA: Diagnosis not present

## 2019-03-27 DIAGNOSIS — C8280 Other types of follicular lymphoma, unspecified site: Secondary | ICD-10-CM

## 2019-03-27 DIAGNOSIS — I2510 Atherosclerotic heart disease of native coronary artery without angina pectoris: Secondary | ICD-10-CM | POA: Diagnosis not present

## 2019-03-27 DIAGNOSIS — G893 Neoplasm related pain (acute) (chronic): Secondary | ICD-10-CM | POA: Diagnosis not present

## 2019-03-27 DIAGNOSIS — Z9221 Personal history of antineoplastic chemotherapy: Secondary | ICD-10-CM | POA: Diagnosis not present

## 2019-03-27 DIAGNOSIS — C8295 Follicular lymphoma, unspecified, lymph nodes of inguinal region and lower limb: Secondary | ICD-10-CM | POA: Diagnosis not present

## 2019-03-27 LAB — CBC WITH DIFFERENTIAL/PLATELET
Abs Immature Granulocytes: 0.03 10*3/uL (ref 0.00–0.07)
Basophils Absolute: 0.1 10*3/uL (ref 0.0–0.1)
Basophils Relative: 1 %
Eosinophils Absolute: 0.1 10*3/uL (ref 0.0–0.5)
Eosinophils Relative: 1 %
HCT: 48.1 % (ref 39.0–52.0)
Hemoglobin: 16.3 g/dL (ref 13.0–17.0)
Immature Granulocytes: 0 %
Lymphocytes Relative: 30 %
Lymphs Abs: 2.6 10*3/uL (ref 0.7–4.0)
MCH: 30.4 pg (ref 26.0–34.0)
MCHC: 33.9 g/dL (ref 30.0–36.0)
MCV: 89.6 fL (ref 80.0–100.0)
Monocytes Absolute: 0.7 10*3/uL (ref 0.1–1.0)
Monocytes Relative: 8 %
Neutro Abs: 5.4 10*3/uL (ref 1.7–7.7)
Neutrophils Relative %: 60 %
Platelets: 205 10*3/uL (ref 150–400)
RBC: 5.37 MIL/uL (ref 4.22–5.81)
RDW: 14.2 % (ref 11.5–15.5)
WBC: 8.9 10*3/uL (ref 4.0–10.5)
nRBC: 0 % (ref 0.0–0.2)

## 2019-03-27 LAB — COMPREHENSIVE METABOLIC PANEL
ALT: 18 U/L (ref 0–44)
AST: 14 U/L — ABNORMAL LOW (ref 15–41)
Albumin: 4.2 g/dL (ref 3.5–5.0)
Alkaline Phosphatase: 58 U/L (ref 38–126)
Anion gap: 10 (ref 5–15)
BUN: 16 mg/dL (ref 8–23)
CO2: 28 mmol/L (ref 22–32)
Calcium: 9.3 mg/dL (ref 8.9–10.3)
Chloride: 102 mmol/L (ref 98–111)
Creatinine, Ser: 1.17 mg/dL (ref 0.61–1.24)
GFR calc Af Amer: >60 mL/min (ref >60)
GFR calc non Af Amer: >60 mL/min (ref >60)
Glucose, Bld: 103 mg/dL — ABNORMAL HIGH (ref 70–99)
Potassium: 4.3 mmol/L (ref 3.5–5.1)
Sodium: 140 mmol/L (ref 135–145)
Total Bilirubin: 0.7 mg/dL (ref 0.3–1.2)
Total Protein: 6.8 g/dL (ref 6.5–8.1)

## 2019-03-27 MED ORDER — OXYCODONE HCL 5 MG PO TABS: 5.0000 mg | ORAL_TABLET | ORAL | 0 refills | Status: DC | PRN

## 2019-03-27 NOTE — Telephone Encounter (Signed)
Called and given below message. He verbalized understanding. He does not want a scan at this time, he declined the scan. He is taking pain medication now only once a day. He feels the pain is some better and the oxycodone is helping. He has a history of back disc issues. He would like to try taking 2 of the oxycodone's and ask that you send Rx to pharmacy. He is almost out.

## 2019-03-27 NOTE — Telephone Encounter (Signed)
1) His labs are great, no need to come back until end of month as scheduled 2) I refilled his oxycodone and give him a bigger supply, he can take up to 6 tabs per day if needed. Watch out for constipation and sedation

## 2019-03-27 NOTE — Telephone Encounter (Signed)
Called and given below message. He verbalized understanding. 

## 2019-03-27 NOTE — Telephone Encounter (Signed)
1) I am not sure how many areas of pain he has. His history is not consistent 2) It is too soon to order a CT scan but if he has concerns, I can order it 3) If 1 oxycodone does not help, he can double it and see if that controls that better.

## 2019-03-28 ENCOUNTER — Other Ambulatory Visit: Payer: Self-pay

## 2019-03-28 ENCOUNTER — Emergency Department (HOSPITAL_COMMUNITY): Payer: Medicare Other

## 2019-03-28 ENCOUNTER — Encounter (HOSPITAL_COMMUNITY): Payer: Self-pay | Admitting: *Deleted

## 2019-03-28 ENCOUNTER — Telehealth: Payer: Self-pay

## 2019-03-28 ENCOUNTER — Emergency Department (HOSPITAL_COMMUNITY)
Admission: EM | Admit: 2019-03-28 | Discharge: 2019-03-29 | Disposition: A | Discharge status: Home / Self Care | Payer: Medicare Other | Attending: Emergency Medicine | Admitting: Emergency Medicine

## 2019-03-28 DIAGNOSIS — R079 Chest pain, unspecified: Secondary | ICD-10-CM | POA: Diagnosis not present

## 2019-03-28 DIAGNOSIS — R0789 Other chest pain: Secondary | ICD-10-CM | POA: Insufficient documentation

## 2019-03-28 DIAGNOSIS — C8512 Unspecified B-cell lymphoma, intrathoracic lymph nodes: Secondary | ICD-10-CM | POA: Diagnosis not present

## 2019-03-28 DIAGNOSIS — Z87891 Personal history of nicotine dependence: Secondary | ICD-10-CM | POA: Diagnosis not present

## 2019-03-28 DIAGNOSIS — I480 Paroxysmal atrial fibrillation: Secondary | ICD-10-CM | POA: Diagnosis not present

## 2019-03-28 DIAGNOSIS — Z79899 Other long term (current) drug therapy: Secondary | ICD-10-CM | POA: Diagnosis not present

## 2019-03-28 DIAGNOSIS — R0602 Shortness of breath: Secondary | ICD-10-CM | POA: Diagnosis not present

## 2019-03-28 LAB — CBC WITH DIFFERENTIAL/PLATELET
Abs Immature Granulocytes: 0.03 10*3/uL (ref 0.00–0.07)
Basophils Absolute: 0 10*3/uL (ref 0.0–0.1)
Basophils Relative: 1 %
Eosinophils Absolute: 0.1 10*3/uL (ref 0.0–0.5)
Eosinophils Relative: 1 %
HCT: 47.2 % (ref 39.0–52.0)
Hemoglobin: 15.8 g/dL (ref 13.0–17.0)
Immature Granulocytes: 0 %
Lymphocytes Relative: 14 %
Lymphs Abs: 1 10*3/uL (ref 0.7–4.0)
MCH: 30.2 pg (ref 26.0–34.0)
MCHC: 33.5 g/dL (ref 30.0–36.0)
MCV: 90.2 fL (ref 80.0–100.0)
Monocytes Absolute: 0.7 10*3/uL (ref 0.1–1.0)
Monocytes Relative: 10 %
Neutro Abs: 5.3 10*3/uL (ref 1.7–7.7)
Neutrophils Relative %: 74 %
Platelets: 191 10*3/uL (ref 150–400)
RBC: 5.23 MIL/uL (ref 4.22–5.81)
RDW: 14.1 % (ref 11.5–15.5)
WBC: 7.1 10*3/uL (ref 4.0–10.5)
nRBC: 0 % (ref 0.0–0.2)

## 2019-03-28 LAB — BASIC METABOLIC PANEL
Anion gap: 8 (ref 5–15)
BUN: 18 mg/dL (ref 8–23)
CO2: 27 mmol/L (ref 22–32)
Calcium: 8.8 mg/dL — ABNORMAL LOW (ref 8.9–10.3)
Chloride: 97 mmol/L — ABNORMAL LOW (ref 98–111)
Creatinine, Ser: 1.22 mg/dL (ref 0.61–1.24)
GFR calc Af Amer: >60 mL/min (ref >60)
GFR calc non Af Amer: 58 mL/min — ABNORMAL LOW (ref >60)
Glucose, Bld: 122 mg/dL — ABNORMAL HIGH (ref 70–99)
Potassium: 4.3 mmol/L (ref 3.5–5.1)
Sodium: 132 mmol/L — ABNORMAL LOW (ref 135–145)

## 2019-03-28 LAB — TROPONIN I (HIGH SENSITIVITY): Troponin I (High Sensitivity): 3 ng/L (ref <18)

## 2019-03-28 IMAGING — DX DG CHEST 2V
2 series · 2 of 2 positions shown · non-contrast
Comparison: CT [DATE]

CLINICAL DATA: Chest pain. Shortness of breath.

EXAM:
CHEST - 2 VIEW

[chest pa]
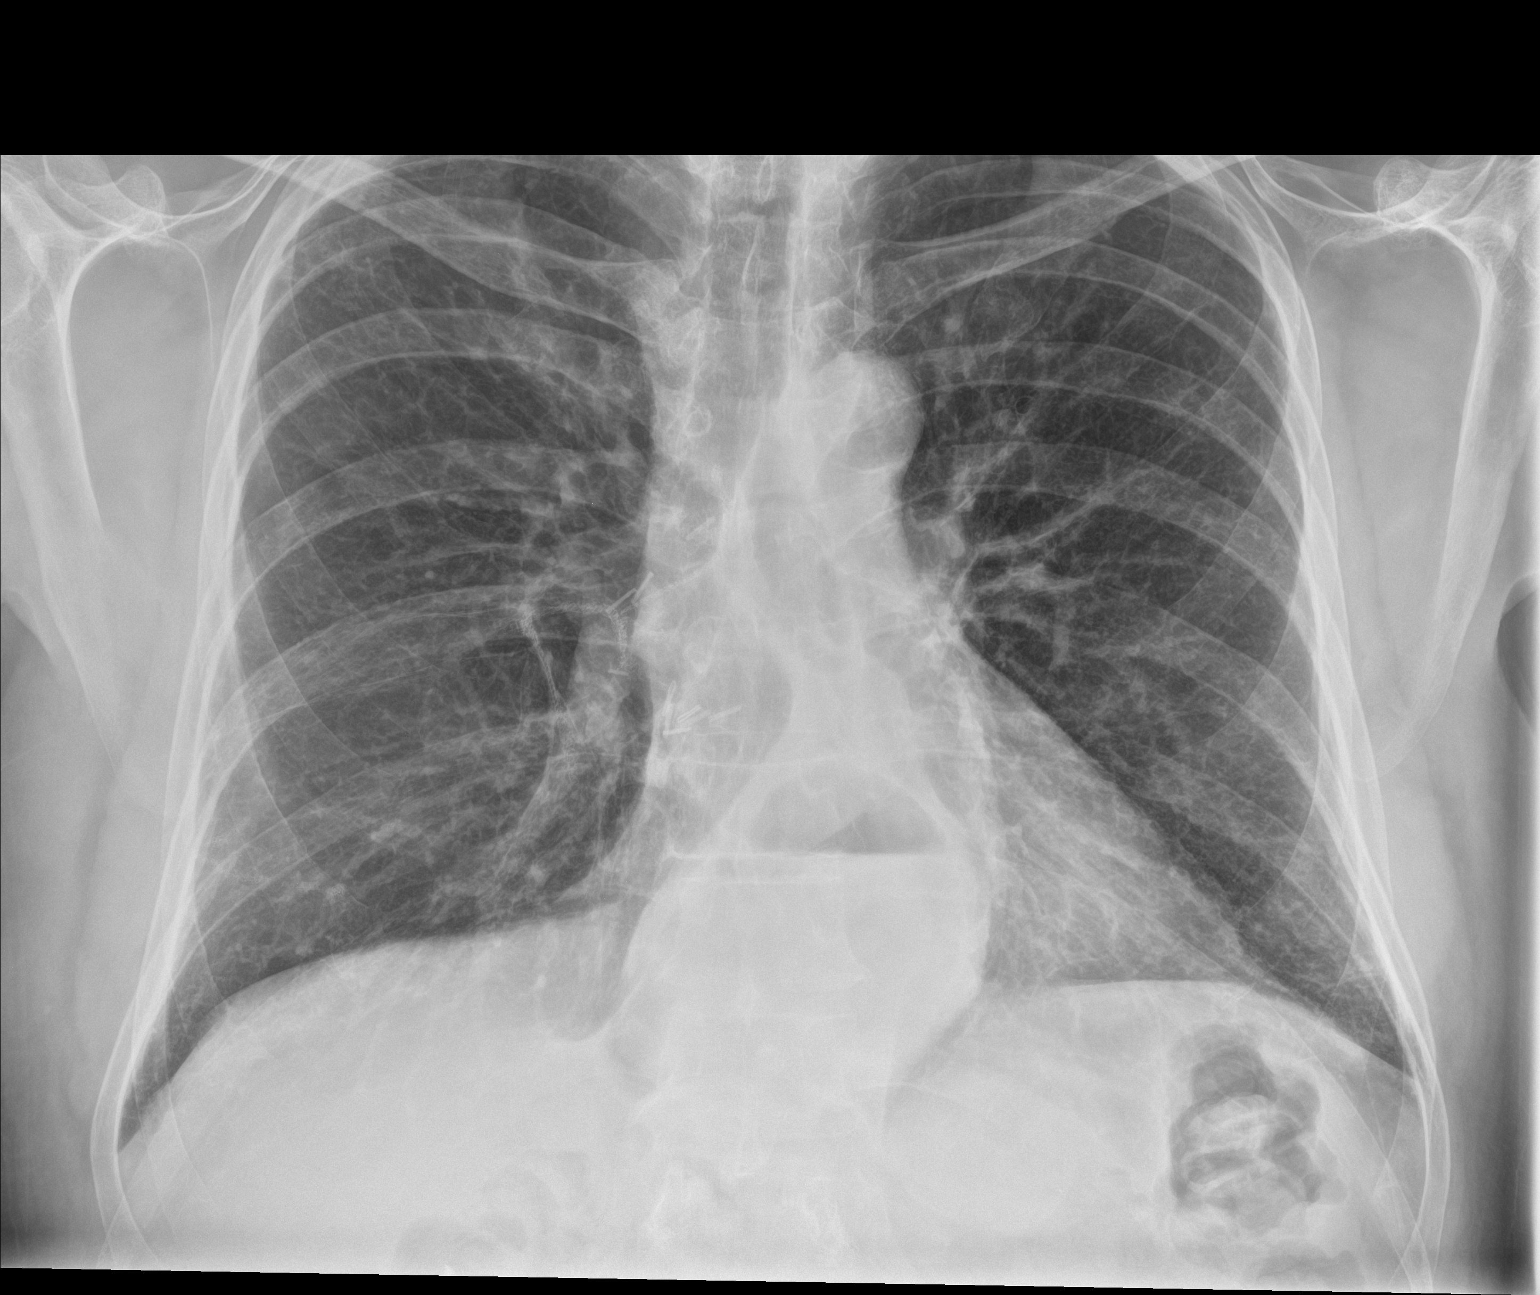

[chest lat]
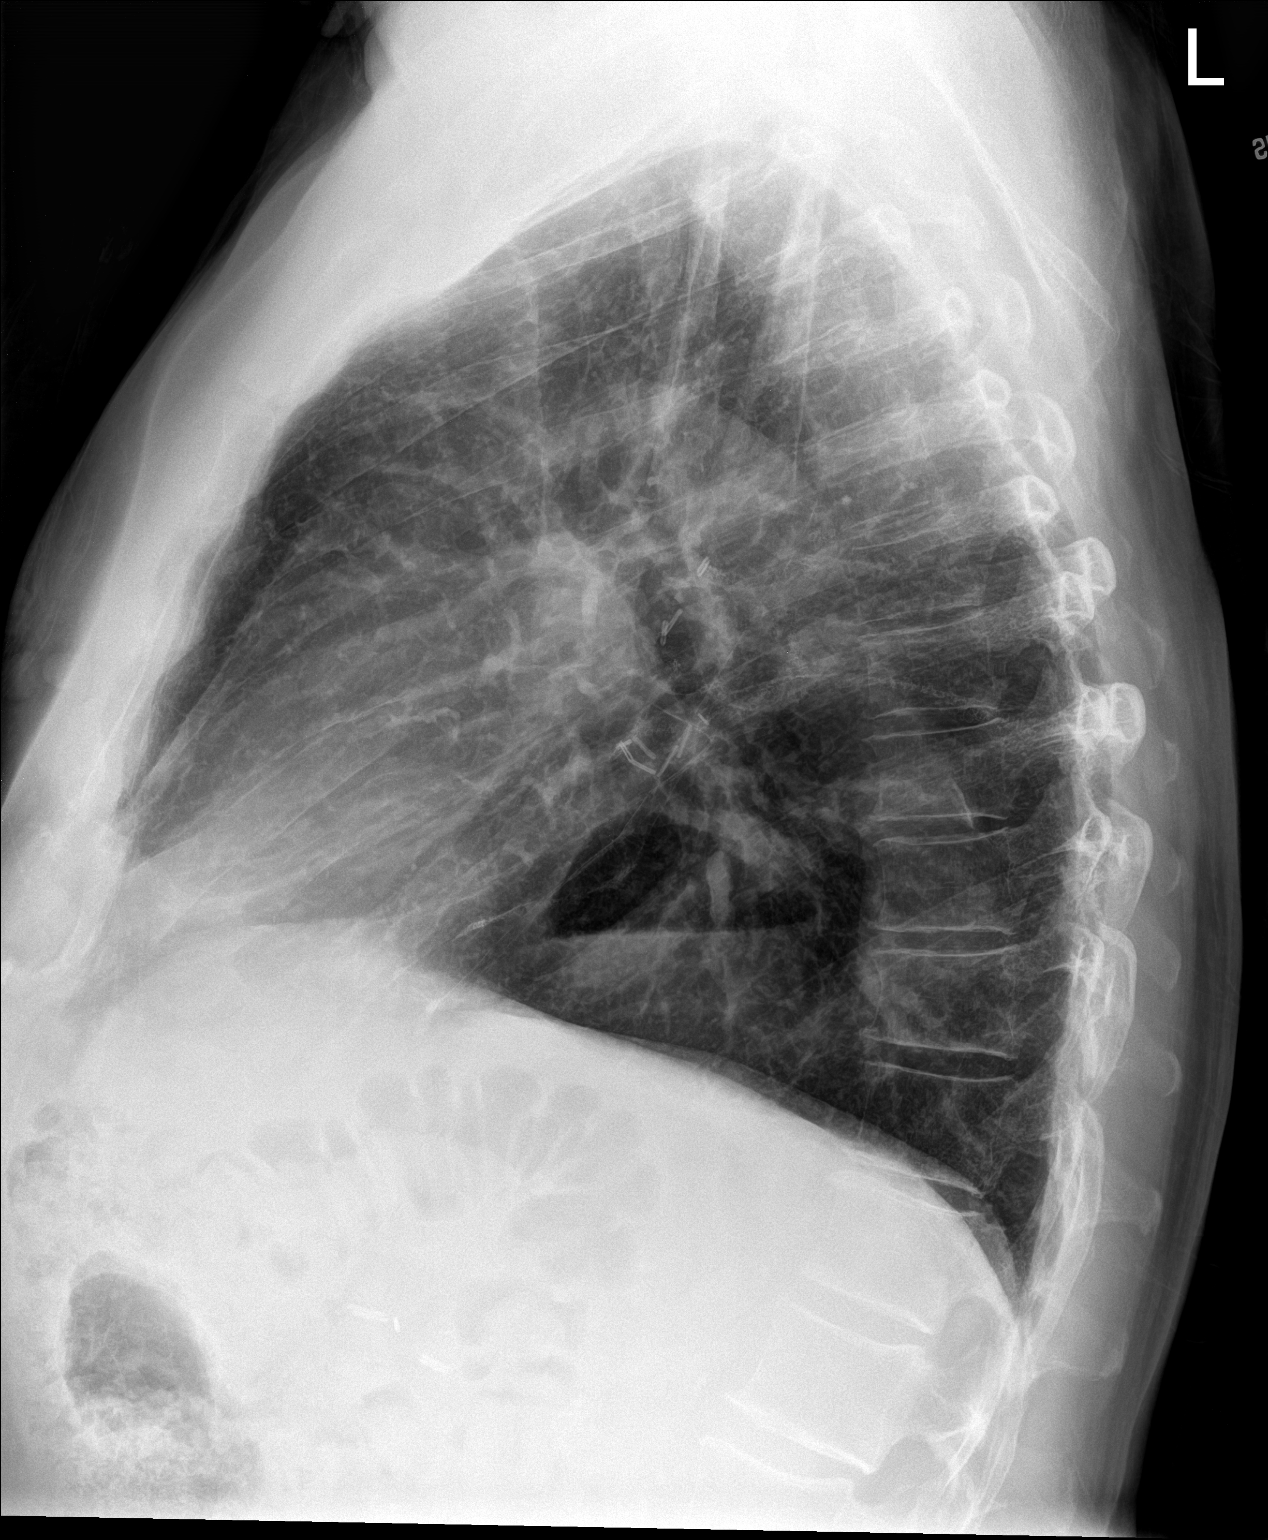

[2 of 2 positions shown; findings below may reference images not displayed]

FINDINGS: Chronic hyperinflation. Postsurgical change in the right perihilar
region with surgical clips and chain sutures. Mild chronic
interstitial coarsening. Normal heart size with unchanged
mediastinal contours. Hiatal hernia with air-fluid level. No acute
airspace disease, pulmonary edema, pleural effusion or pneumothorax.
No acute osseous abnormalities are seen.
IMPRESSION: Chronic hyperinflation and postsurgical change in the right
hemithorax. Chronic interstitial coarsening related emphysema. No
acute abnormality.

## 2019-03-28 MED ORDER — ACYCLOVIR 800 MG PO TABS
400.0000 mg | ORAL_TABLET | Freq: Once | ORAL | Status: AC
  Administered 2019-03-28: 400 mg via ORAL
  Filled 2019-03-28: qty 1

## 2019-03-28 MED ORDER — SODIUM CHLORIDE 0.9% FLUSH: 3.0000 mL | Freq: Once | INTRAVENOUS | Status: DC

## 2019-03-28 NOTE — ED Triage Notes (Signed)
Pt c/o chest pain that wrapped around both sides of his ribs today; pt had sob when the pain started and some nausea; pt states he felt hot; pt states the pain is constant, nothing makes the pain worse or better

## 2019-03-28 NOTE — Telephone Encounter (Signed)
Received call from pt's spouse.  She stated that he has been taking medications for back pain as prescribed but today he has pain in his abdomen and upper back as well.

## 2019-03-28 NOTE — ED Provider Notes (Signed)
Medical screening examination/treatment/procedure(s) were conducted as a shared visit with non-physician practitioner(s) and myself.  I personally evaluated the patient during the encounter.  Clinical Impression:   Final diagnoses:  None   This patient is a 74 year old male presenting with some chest heaviness throughout the day with associated nausea, he was significantly symptomatic with this initially but is gradually improved and is now located in the right posterior chest.  He has no swelling of the legs, no history of exertional symptoms, he does have lymphoma, no history of PE, he is not hypoxic or tachycardic and has no swelling of the legs.   EKG Interpretation  Date/Time:  Wednesday March 28 2019 20:21:14 EDT Ventricular Rate:  65 PR Interval:    QRS Duration: 86 QT Interval:  376 QTC Calculation: 391 R Axis:   58 Text Interpretation: Sinus rhythm Normal ECG since last tracing no significant change Confirmed by Noemi Chapel [phone]) on 03/28/2019 9:01:51 PM      This patient's EKG is unremarkable, his troponin is negative, chest x-ray shows no significant findings related to this acute illness.  Second troponin pending   Noemi Chapel, MD 03/29/19 1540

## 2019-03-28 NOTE — ED Provider Notes (Signed)
[facility] EMERGENCY DEPARTMENT Provider Note   CSN: [id number] Arrival date & time: 03/28/19  1845     History Chief Complaint  Patient presents with  . Chest Pain    Anthony Kelly is a 74 y.o. male.  HPI      Anthony Kelly is a 74 y.o. 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  He describes having  pain to mid to lower chest that radiates all the way around his chest and mid back.  Chest pain associated with mild shortness of breath at onset of his pain and  nausea.  Pain has improved upon arrival but has not resolved.  Nothing makes the pain better or worse.  He denies cough, peripheral edema, fever chills. No hx of PE    PCP is Dr. Warrick Parisian Cardiologist is Dr. Bronson Ing Oncologist is Dr. Alvy Bimler   Past Medical History:  Diagnosis Date  . Acute pericarditis    a. 04/2013 -adm with CP, elevated CRP. H/o coronary artery calcification on prior CT but nuc was negative, EF 71%.  . Arthritis   . Atrial fibrillation ([place])    a. Isolated episode in the setting of acute pericarditis 04/2013. Was not placed on anticoag.  Marland Kitchen BPH (benign prostatic hyperplasia)   . Cataract   . Diverticulitis 08/16/2013  . Hyperlipidemia    elevated triglycerides  . Low grade B-cell lymphoma ([place]) 05/11/2011   Initial dx 6/04 left inguinal adenopathy Rx observation; convert to hi grade 11/05 Rx CHOP-R; lesion right lung resected 12/08: low grade NHL; new lesion left submandibular gland 2/13  resected 04/30/11 lo grade NHL  . Malignant lymphoma, high grade ([place]) 03/14/2011  . Metastasis to lung ([place]) dx'd 01/2007  . Metastasis to lymph nodes ([place]) dx'd 03/2011   lt submandibular ln  . Pain in joint, pelvic region and thigh 08/01/2013  . PSVT (paroxysmal supraventricular tachycardia) [facility])      Patient Active Problem List   Diagnosis Date Noted  . Cancer associated pain 03/20/2019  . Hypotension due to drugs 03/06/2019  . Goals of care, counseling/discussion 02/13/2019  . Dyspnea on effort 01/26/2019  . Abdominal pain 01/26/2019  . Excessive flatus 01/26/2019  . Muscle cramp 08/16/2017  . Prediabetes 07/11/2017  . Other fatigue 02/03/2017  . DDD (degenerative disc disease), lumbar 01/23/2016  . Osteopenia 10/16/2015  . Benign prostate hyperplasia 10/17/2014  . Blurred vision, right eye 09/27/2014  . 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 Hyperlipidemia 11/27/2013  . GERD (gastroesophageal reflux disease) 10/24/2013  . PAF (paroxysmal atrial fibrillation) ([place]) 05/10/2013  . History of PSVT (paroxysmal supraventricular tachycardia) 05/10/2013  . Family history of coronary artery disease 05/10/2013  . Coronary artery calcification seen on CAT scan 05/10/2013  . 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 CHOLECYSTECTOMY    . EXPLORATORY LAPAROTOMY    . EYE SURGERY Right 2016   cataract  . LUNG LOBECTOMY     right side  . LYMPH NODE BIOPSY     in groin with removal  . MENISCUS REPAIR     right knee  . PROSTATE SURGERY    . REFRACTIVE SURGERY Right    piece of metal removed  . SUBMANDIBULAR GLAND EXCISION  04/2011  . SUBMANDIBULAR GLAND EXCISION  04/30/2011   Procedure: EXCISION  SUBMANDIBULAR GLAND;  Surgeon: Jerrell Belfast, MD;  Location: [facility];  Service: ENT;  Laterality: Left;  WITH DIAGNOSTIC BIOPSY  . TONSILLECTOMY     as a child  . VEIN LIGATION AND STRIPPING     right leg       Family History  Problem Relation Age of Onset  . Heart disease Father   . Heart attack Father        x 3  . Cancer Sister        breast ca  . Cancer Brother        prostate ca  . Heart attack Sister   . Cancer Sister        breast  . Heart disease Brother   . Alzheimer's disease Sister   . Diabetes Sister   .  Heart disease Brother   . Diabetes Brother   . Heart disease Brother   . Heart disease Brother   . Heart disease Brother   . Heart disease Brother   . Heart disease Brother   . Alzheimer's disease Brother   . Diabetes Son   . Anesthesia problems Neg Hx     Social History   Tobacco Use  . Smoking status: Former Smoker    Packs/day: 1.50    Years: 25.00    Pack years: 37.50    Types: Cigarettes    Start date: 12/12/1962    Quit date: 01/11/1990    Years since quitting: 29.2  . Smokeless tobacco: Never Used  Substance Use Topics  . Alcohol use: No    Alcohol/week: 0.0 standard drinks  . Drug use: No    Home Medications Prior to Admission medications   Medication Sig Start Date End Date Taking? Authorizing Provider  acyclovir (ZOVIRAX) 400 MG tablet Take 1 tablet (400 mg total) by mouth 2 (two) times daily. 03/06/19  Yes Gorsuch, Ni, MD  allopurinol (ZYLOPRIM) 300 MG tablet Take 1 tablet (300 mg total) by mouth daily. 02/15/19  Yes Gorsuch, Ni, MD  brimonidine (ALPHAGAN) 0.2 % ophthalmic solution 1 drop 2 (two) times daily. 02/08/19  Yes [provider]  idelalisib (ZYDELIG) 150 MG tablet Take 1 tablet (150 mg total) by mouth 2 (two) times daily. 02/13/19  Yes Gorsuch, Ni, MD  LUMIGAN 0.01 % SOLN Place 1 drop into both eyes daily. 03/07/17  Yes [provider]  Magnesium 250 MG TABS Take 1 tablet by mouth daily.   Yes [provider]  Multiple Vitamin (MULTIVITAMIN WITH MINERALS) TABS tablet Take 1 tablet by mouth every morning.    Yes [provider]  NON FORMULARY Take by mouth daily. Beet Root   Yes [provider]  omeprazole (PRILOSEC) 20 MG capsule Take 1 capsule (20 mg total) by mouth daily. 07/17/18  Yes Dettinger, [name], MD  ondansetron (ZOFRAN) 8 MG tablet Take 1 tablet (8 mg total) by mouth every 8 (eight) hours as needed for nausea. 02/15/19  Yes Gorsuch, Ni, MD  oxyCODONE (OXY IR/ROXICODONE) 5 MG immediate release tablet Take 1  tablet (5 mg total) by mouth every 4 (four) hours as needed for severe pain. 03/27/19  Yes Gorsuch, Ni, MD  pravastatin (PRAVACHOL) 20 MG tablet Take 1 tablet (20 mg total) by mouth daily. 07/17/18  Yes Dettinger, [name], MD  Probiotic Product (PROBIOTIC DAILY PO) Take 1 capsule by mouth daily.    Yes [provider]  prochlorperazine (COMPAZINE) 10 MG tablet Take 1 tablet (10 mg total) by mouth every 6 (  six) hours as needed for nausea or vomiting. 02/15/19  Yes Gorsuch, Ni, MD  predniSONE (DELTASONE) 20 MG tablet Take 2 tablets (40 mg total) by mouth daily with breakfast. Patient not taking: Reported on 03/28/2019 03/20/19   [name], MD    Allergies    Patient has no known allergies.  Review of Systems   Review of Systems  Constitutional: Negative for appetite change, chills and fever.  Respiratory: Positive for chest tightness and shortness of breath. Negative for cough and wheezing.   Cardiovascular: Positive for chest pain. Negative for palpitations and leg swelling.  Gastrointestinal: Positive for nausea. Negative for abdominal pain.  Genitourinary: Negative for decreased urine volume, dysuria, flank pain and hematuria.  Musculoskeletal: Negative for arthralgias and myalgias.  Neurological: Negative for syncope, weakness, numbness and headaches.    Physical Exam Updated Vital Signs BP (!) 142/72 (BP Location: Right Arm)   Pulse 74   Temp (!) 97.2 F (36.2 C) (Oral)   Resp 18   Ht 6\' 2"  (1.88 m)   Wt 83 kg   SpO2 99%   BMI 23.50 kg/m   Physical Exam Vitals and nursing note reviewed.  Constitutional:      General: He is not in acute distress.    Appearance: Normal appearance.  HENT:     Head: Atraumatic.     Mouth/Throat:     Mouth: Mucous membranes are moist.  Cardiovascular:     Rate and Rhythm: Normal rate and regular rhythm.     Pulses: Normal pulses.  Pulmonary:     Effort: Pulmonary effort is normal. No respiratory distress.     Breath sounds: Normal  breath sounds.  Chest:     Chest wall: No tenderness.  Abdominal:     General: There is no distension.     Palpations: Abdomen is soft.     Tenderness: There is no abdominal tenderness.  Musculoskeletal:        General: Normal range of motion.     Cervical back: Normal range of motion.     Right lower leg: No edema.     Left lower leg: No edema.  Skin:    General: Skin is warm.     Capillary Refill: Capillary refill takes less than 2 seconds.     Findings: No rash.  Neurological:     General: No focal deficit present.     Mental Status: He is alert.     Sensory: No sensory deficit.     Motor: No weakness.     ED Results / Procedures / Treatments   Labs (all labs ordered are listed, but only abnormal results are displayed) Labs Reviewed  BASIC METABOLIC PANEL - Abnormal; Notable for the following components:      Result Value   Sodium 132 (*)    Chloride 97 (*)    Glucose, Bld 122 (*)    Calcium 8.8 (*)    GFR calc non Af Amer 58 (*)    All other components within normal limits  CBC WITH DIFFERENTIAL/PLATELET  TROPONIN I (HIGH SENSITIVITY)  TROPONIN I (HIGH SENSITIVITY)  TROPONIN I (HIGH SENSITIVITY)    EKG EKG Interpretation  Date/Time:  Wednesday March 28 2019 20:21:14 EDT Ventricular Rate:  65 PR Interval:    QRS Duration: 86 QT Interval:  376 QTC Calculation: 391 R Axis:   58 Text Interpretation: Sinus rhythm Normal ECG since last tracing no significant change Confirmed by Noemi Chapel [phone]) on 03/28/2019 9:01:51 PM  Radiology DG Chest 2 View  Result Date: 03/28/2019 CLINICAL DATA:  Chest pain. Shortness of breath. EXAM: CHEST - 2 VIEW COMPARISON:  CT 01/31/2019 FINDINGS: Chronic hyperinflation. Postsurgical change in the right perihilar region with surgical clips and chain sutures. Mild chronic interstitial coarsening. Normal heart size with unchanged mediastinal contours. Hiatal hernia with air-fluid level. No acute airspace disease, pulmonary edema,  pleural effusion or pneumothorax. No acute osseous abnormalities are seen. IMPRESSION: Chronic hyperinflation and postsurgical change in the right hemithorax. Chronic interstitial coarsening related emphysema. No acute abnormality. Electronically Signed   By: Keith Rake M.D.   On: 03/28/2019 19:36    Procedures Procedures (including critical care time)  Medications Ordered in ED Medications - No data to display  ED Course  I have reviewed the triage vital signs and the nursing notes.  Pertinent labs & imaging results that were available during my care of the patient were reviewed by me and considered in my medical decision making (see chart for details).    MDM Rules/Calculators/A&P                      Patient with persistent chest pain since onset at 1 PM.  Pain has somewhat improved upon arrival, but has not resolved.  Pain has not been positional or described as exertional.  Delta troponin is reassuring and EKG shows a normal sinus rhythm.  No tachycardia, tachypnea, or hypoxia.  Clinically I doubt PE.  Given patient persistence of symptoms I felt that patient would benefit from admission.  He prefers to contact his cardiologist tomorrow morning to arrange follow-up in the office.  He agrees to strict return precautions.  Patient was also seen by Dr. Sabra Heck care plan discussed   Final Clinical Impression(s) / ED Diagnoses Final diagnoses:  Atypical chest pain    Rx / DC Orders ED Discharge Orders    None       Bufford Lope 03/29/19 0126    Noemi Chapel, MD 03/29/19 1539

## 2019-03-29 ENCOUNTER — Other Ambulatory Visit: Payer: Self-pay | Admitting: Hematology and Oncology

## 2019-03-29 ENCOUNTER — Encounter: Payer: Self-pay | Admitting: Hematology and Oncology

## 2019-03-29 ENCOUNTER — Telehealth: Payer: Self-pay | Admitting: Cardiovascular Disease

## 2019-03-29 ENCOUNTER — Telehealth: Payer: Self-pay

## 2019-03-29 DIAGNOSIS — C8280 Other types of follicular lymphoma, unspecified site: Secondary | ICD-10-CM

## 2019-03-29 LAB — TROPONIN I (HIGH SENSITIVITY): Troponin I (High Sensitivity): 2 ng/L (ref <18)

## 2019-03-29 NOTE — Telephone Encounter (Signed)
Called and given below message to wife and patient. They both verbalized understanding. He is agreeable to a CT scan. He went to ER at [facility] last night for chest pressure and nausea. His wife has called his cardiologist in [place] already this morning. Notes are in Epic.

## 2019-03-29 NOTE — Telephone Encounter (Signed)
I ordered Ct for Wed, please advise him or wife to call for scheduling I can see him next Thursday at 1 pm to review results, please schedule

## 2019-03-29 NOTE — Telephone Encounter (Signed)
Make sure he is not constipated I agree with recommendation to try in 1 hour

## 2019-03-29 NOTE — Telephone Encounter (Signed)
Called and given below message to wife. She verbalized understanding. He is not constipated. With the complaint of severe abdominal pain and vomiting. Instructed to go to ER to be evaluated. Wife verbalized understanding and they will come to [facility] ER.

## 2019-03-29 NOTE — Telephone Encounter (Signed)
Called and given below message to wife. She verbalized understanding. Given radiology scheduler number to call and schedule appt. Scheduled follow up appt for 3/25 at 1 pm.  Wife said just FYI and she wanted you to know that he is complaining of being hot all the time.

## 2019-03-29 NOTE — Telephone Encounter (Signed)
Pt was seen in ER yesterday, was told to make apt because he may have a blockage and they wanted pt to be seen in office ASAP.    I have checked APP's at [facility] no openings, please let me know if I could add pt on today to see Dr. Bronson Ing

## 2019-03-29 NOTE — Telephone Encounter (Signed)
Reviewed by Dr.koneswaran.Next available with APP fine.Apt made for 04/04/19 at 2 pm with L.Dorene Ar, NP. Wife made aware

## 2019-03-29 NOTE — Discharge Instructions (Addendum)
Please contact your cardiologist, Dr. Bronson Ing tomorrow morning to arrange a follow-up appointment.  Return to the emergency department if you develop any worsening symptoms.

## 2019-03-29 NOTE — Telephone Encounter (Signed)
Hi Anthony Kelly,  Please call him or wife back His exam is not helpful With him calling about pain every week, I suggest repeat CT scan now sooner than later because I cannot help him without knowing what causes his pain If he agrees, let me know and I will place order For now continue pain medication plus tylenol

## 2019-03-29 NOTE — Telephone Encounter (Signed)
Called back. Wife had left a message asking if the appt for CT scan on 3/29, with follow up with Dr. Alvy Bimler on 3/30 is okay. Told her appts are okay. Anthony Kelly just vomited large amount. He is unable take Zofran/compazine due to feeling so sick. He ate breakfast with no problems. His wife is going to let him rest for 1 hour then give him Zofran. After he takes Zofran she will get him to drink fluids. Should she do anything else? Cardiology appt is scheduled next Wednesday.

## 2019-04-03 ENCOUNTER — Telehealth: Payer: Self-pay

## 2019-04-03 NOTE — Telephone Encounter (Signed)
Called and spoke with Anthony Kelly and wife on speaker phone. He is doing great with no complaints. His symptoms resolved on 3/18 and he never went to the ER for the vomiting/ abdominal pain. He will see cardiology tomorrow to follow up. He and his wife would like to cancel CT appt for next week. He does not feel that he needs the scan now.

## 2019-04-03 NOTE — Telephone Encounter (Signed)
-----   Message from [name], MD sent at 04/03/2019 10:42 AM EDT ----- Regarding: can you call his wife and ask how he is doing? Is he planning to just keep CT next week?

## 2019-04-03 NOTE — Telephone Encounter (Signed)
Called and left below message. Ask wife or Gershon Mussel to call the office back.

## 2019-04-03 NOTE — Telephone Encounter (Signed)
Pls go ahead and cancel the CT scan appt I will just see him as scheduled on Tuesday

## 2019-04-03 NOTE — Telephone Encounter (Signed)
Called and canceled CT scan for 3/29.

## 2019-04-04 ENCOUNTER — Telehealth: Payer: Self-pay | Admitting: Cardiovascular Disease

## 2019-04-04 NOTE — Telephone Encounter (Signed)
Virtual Visit Pre-Appointment Phone Call  "(Name), I am calling you today to discuss your upcoming appointment. We are currently trying to limit exposure to the virus that causes COVID-19 by seeing patients at home rather than in the office."  1. "What is the BEST phone number to call the day of the visit?" -  [phone] 2.  3. "Do you have or have access to (through a family member/friend) a smartphone with video capability that we can use for your visit?" a. If yes - list this number in appt notes as "cell" (if different from BEST phone #) and list the appointment type as a VIDEO visit in appointment notes b. If no - list the appointment type as a PHONE visit in appointment notes  4. Confirm consent - "In the setting of the current Covid19 crisis, you are scheduled for a (phone or video) visit with your provider on (date) at (time).  Just as we do with many in-office visits, in order for you to participate in this visit, we must obtain consent.  If you'd like, I can send this to your mychart (if signed up) or email for you to review.  Otherwise, I can obtain your verbal consent now.  All virtual visits are billed to your insurance company just like a normal visit would be.  By agreeing to a virtual visit, we'd like you to understand that the technology does not allow for your provider to perform an examination, and thus may limit your provider's ability to fully assess your condition. If your provider identifies any concerns that need to be evaluated in person, we will make arrangements to do so.  Finally, though the technology is pretty good, we cannot assure that it will always work on either your or our end, and in the setting of a video visit, we may have to convert it to a phone-only visit.  In either situation, we cannot ensure that we have a secure connection.  Are you willing to proceed?" STAFF: Did the patient verbally acknowledge consent to telehealth visit? Document YES/NO here:   YES 5.   6. Advise patient to be prepared - "Two hours prior to your appointment, go ahead and check your blood pressure, pulse, oxygen saturation, and your weight (if you have the equipment to check those) and write them all down. When your visit starts, your provider will ask you for this information. If you have an Apple Watch or Kardia device, please plan to have heart rate information ready on the day of your appointment. Please have a pen and paper handy nearby the day of the visit as well."  7. Give patient instructions for MyChart download to smartphone OR Doximity/Doxy.me as below if video visit (depending on what platform provider is using)  8. Inform patient they will receive a phone call 15 minutes prior to their appointment time (may be from unknown caller ID) so they should be prepared to answer    TELEPHONE CALL NOTE  Ankush Gintz has been deemed a candidate for a follow-up tele-health visit to limit community exposure during the Covid-19 pandemic. I spoke with the patient via phone to ensure availability of phone/video source, confirm preferred email & phone number, and discuss instructions and expectations.  I reminded Quante Pettry to be prepared with any vital sign and/or heart rhythm information that could potentially be obtained via home monitoring, at the time of his visit. I reminded Shahrukh Pasch to expect a phone call prior to his visit.  Chanda Busing 04/04/2019 2:55 PM   INSTRUCTIONS FOR DOWNLOADING THE MYCHART APP TO SMARTPHONE  - The patient must first make sure to have activated MyChart and know their login information - If Apple, go to [employer] and type in MyChart in the search bar and download the app. If Android, ask patient to go to [employer] and type in [place] in the search bar and download the app. The app is free but as with any other app downloads, their phone may require them to verify saved payment information or Apple/Android password.  -  The patient will need to then log into the app with their MyChart username and password, and select [facility] as their healthcare provider to link the account. When it is time for your visit, go to the MyChart app, find appointments, and click Begin Video Visit. Be sure to Select Allow for your device to access the Microphone and Camera for your visit. You will then be connected, and your provider will be with you shortly.  **If they have any issues connecting, or need assistance please contact MyChart service desk [phone])**  **If using a computer, in order to ensure the best quality for their visit they will need to use either of the following Internet Browsers: [employer], or Google Chrome**  IF USING DOXIMITY or DOXY.ME - The patient will receive a link just prior to their visit by text.     FULL LENGTH CONSENT FOR TELE-HEALTH VISIT   I hereby voluntarily request, consent and authorize [facility] and its employed or contracted physicians, physician assistants, nurse practitioners or other licensed health care professionals (the Practitioner), to provide me with telemedicine health care services (the "Services") as deemed necessary by the treating Practitioner. I acknowledge and consent to receive the Services by the Practitioner via telemedicine. I understand that the telemedicine visit will involve communicating with the Practitioner through live audiovisual communication technology and the disclosure of certain medical information by electronic transmission. I acknowledge that I have been given the opportunity to request an in-person assessment or other available alternative prior to the telemedicine visit and am voluntarily participating in the telemedicine visit.  I understand that I have the right to withhold or withdraw my consent to the use of telemedicine in the course of my care at any time, without affecting my right to future care or treatment, and that the  Practitioner or I may terminate the telemedicine visit at any time. I understand that I have the right to inspect all information obtained and/or recorded in the course of the telemedicine visit and may receive copies of available information for a reasonable fee.  I understand that some of the potential risks of receiving the Services via telemedicine include:  Marland Kitchen Delay or interruption in medical evaluation due to technological equipment failure or disruption; . Information transmitted may not be sufficient (e.g. poor resolution of images) to allow for appropriate medical decision making by the Practitioner; and/or  . In rare instances, security protocols could fail, causing a breach of personal health information.  Furthermore, I acknowledge that it is my responsibility to provide information about my medical history, conditions and care that is complete and accurate to the best of my ability. I acknowledge that Practitioner's advice, recommendations, and/or decision may be based on factors not within their control, such as incomplete or inaccurate data provided by me or distortions of diagnostic images or specimens that may result from electronic transmissions. I understand that the  practice of medicine is not an Chief Strategy Officer and that Practitioner makes no warranties or guarantees regarding treatment outcomes. I acknowledge that I will receive a copy of this consent concurrently upon execution via email to the email address I last provided but may also request a printed copy by calling the office of [facility].    I understand that my insurance will be billed for this visit.   I have read or had this consent read to me. . I understand the contents of this consent, which adequately explains the benefits and risks of the Services being provided via telemedicine.  . I have been provided ample opportunity to ask questions regarding this consent and the Services and have had my questions answered to my  satisfaction. . I give my informed consent for the services to be provided through the use of telemedicine in my medical care  By participating in this telemedicine visit I agree to the above.

## 2019-04-05 ENCOUNTER — Ambulatory Visit: Payer: Medicare Other | Admitting: Hematology and Oncology

## 2019-04-05 ENCOUNTER — Telehealth (INDEPENDENT_AMBULATORY_CARE_PROVIDER_SITE_OTHER): Payer: Medicare Other | Admitting: Cardiovascular Disease

## 2019-04-05 ENCOUNTER — Encounter: Payer: Self-pay | Admitting: Cardiovascular Disease

## 2019-04-05 VITALS — BP 107/68 | HR 77 | Ht 72.0 in | Wt 181.0 lb

## 2019-04-05 DIAGNOSIS — R072 Precordial pain: Secondary | ICD-10-CM

## 2019-04-05 DIAGNOSIS — I480 Paroxysmal atrial fibrillation: Secondary | ICD-10-CM

## 2019-04-05 DIAGNOSIS — Z01812 Encounter for preprocedural laboratory examination: Secondary | ICD-10-CM

## 2019-04-05 DIAGNOSIS — I2510 Atherosclerotic heart disease of native coronary artery without angina pectoris: Secondary | ICD-10-CM

## 2019-04-05 DIAGNOSIS — K449 Diaphragmatic hernia without obstruction or gangrene: Secondary | ICD-10-CM

## 2019-04-05 DIAGNOSIS — R079 Chest pain, unspecified: Secondary | ICD-10-CM

## 2019-04-05 NOTE — Progress Notes (Signed)
Virtual Visit via Telephone Note   This visit type was conducted due to national recommendations for restrictions regarding the COVID-19 Pandemic (e.g. social distancing) in an effort to limit this patient's exposure and mitigate transmission in our community.  Due to his co-morbid illnesses, this patient is at least at moderate risk for complications without adequate follow up.  This format is felt to be most appropriate for this patient at this time.  The patient did not have access to video technology/had technical difficulties with video requiring transitioning to audio format only (telephone).  All issues noted in this document were discussed and addressed.  No physical exam could be performed with this format.  Please refer to the patient's chart for his  consent to telehealth for [facility].   The patient was identified using 2 identifiers.  Date:  04/05/2019   ID:  Anthony Kelly, DOB [birth year], MRN [id number]  Patient Location: Home Provider Location: Office  PCP:  Dettinger, [name], MD  Cardiologist:  Kate Sable, MD  Electrophysiologist:  None   Evaluation Performed:  Follow-Up Visit  Chief Complaint:  Chest pain  History of Present Illness:    Anthony Kelly is a 74 y.o. male with a history of PSVT and paroxysmal atrial fibrillation which was an isolated instance in the setting of acute pericarditis several years ago.  He was recently evaluated in the ED for chest discomfort.  I have personally reviewed all documentation, labs, radiographic and cardiovascular studies, and independently interpreted all ECG's.  Troponins and CBC were normal.  Chest x-ray showed chronic hyperinflation and chronic interstitial coarsening related emphysema with no acute abnormalities.  Upon speaking with him further, he describes as a discomfort which wrapped around the entire chest.  Of note, I reviewed his chest CT from 01/31/2019 which showed multivessel coronary artery  calcifications as well as a moderate to large hiatal hernia.  He has not had any decrease in energy levels and denies chest discomfort at this time.  He has been working on his tractor without any exertional symptoms.   Past Medical History:  Diagnosis Date  . Acute pericarditis    a. 04/2013 -adm with CP, elevated CRP. H/o coronary artery calcification on prior CT but nuc was negative, EF 71%.  . Arthritis   . Atrial fibrillation ([place])    a. Isolated episode in the setting of acute pericarditis 04/2013. Was not placed on anticoag.  Marland Kitchen BPH (benign prostatic hyperplasia)   . Cataract   . Diverticulitis 08/16/2013  . Hyperlipidemia    elevated triglycerides  . 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 Pain in joint, pelvic region and thigh 08/01/2013  . PSVT (paroxysmal supraventricular tachycardia) ([place])    Past Surgical History:  Procedure Laterality Date  . CHOLECYSTECTOMY    . EXPLORATORY LAPAROTOMY    . EYE SURGERY Right 2016   cataract  . LUNG LOBECTOMY     right side  . LYMPH NODE BIOPSY     in groin with removal  . MENISCUS REPAIR     right knee  . PROSTATE SURGERY    .  REFRACTIVE SURGERY Right    piece of metal removed  . SUBMANDIBULAR GLAND EXCISION  04/2011  . SUBMANDIBULAR GLAND EXCISION  04/30/2011   Procedure: EXCISION SUBMANDIBULAR GLAND;  Surgeon: Jerrell Belfast, MD;  Location: [place];  Service: ENT;  Laterality: Left;  WITH DIAGNOSTIC BIOPSY  . TONSILLECTOMY     as a child  . VEIN LIGATION AND STRIPPING     right leg     Current Meds  Medication Sig  . acyclovir (ZOVIRAX) 400 MG tablet  Take 1 tablet (400 mg total) by mouth 2 (two) times daily.  Marland Kitchen allopurinol (ZYLOPRIM) 300 MG tablet Take 1 tablet (300 mg total) by mouth daily.  . brimonidine (ALPHAGAN) 0.2 % ophthalmic solution Place 1 drop into both eyes 2 (two) times daily.   . idelalisib (ZYDELIG) 150 MG tablet Take 1 tablet (150 mg total) by mouth 2 (two) times daily.  Marland Kitchen LUMIGAN 0.01 % SOLN Place 1 drop into both eyes daily.  . Magnesium 250 MG TABS Take 1 tablet by mouth daily.  . Multiple Vitamin (MULTIVITAMIN WITH MINERALS) TABS tablet Take 1 tablet by mouth every morning.   . NON FORMULARY Take by mouth daily. Beet Root  . omeprazole (PRILOSEC) 20 MG capsule Take 1 capsule (20 mg total) by mouth daily.  . ondansetron (ZOFRAN) 8 MG tablet Take 1 tablet (8 mg total) by mouth every 8 (eight) hours as needed for nausea.  Marland Kitchen oxyCODONE (OXY IR/ROXICODONE) 5 MG immediate release tablet Take 1 tablet (5 mg total) by mouth every 4 (four) hours as needed for severe pain.  . pravastatin (PRAVACHOL) 20 MG tablet Take 1 tablet (20 mg total) by mouth daily.  . Probiotic Product (PROBIOTIC DAILY PO) Take 1 capsule by mouth daily.   . prochlorperazine (COMPAZINE) 10 MG tablet Take 1 tablet (10 mg total) by mouth every 6 (six) hours as needed for nausea or vomiting.     Allergies:   Patient has no known allergies.   Social History   Tobacco Use  . Smoking status: Former Smoker    Packs/day: 1.50    Years: 25.00    Pack years: 37.50    Types: Cigarettes    Start date: 12/12/1962    Quit date: 01/11/1990    Years since quitting: 29.2  . Smokeless tobacco: Never Used  Substance Use Topics  . Alcohol use: No    Alcohol/week: 0.0 standard drinks  . Drug use: No     Family Hx: The patient's family history includes Alzheimer's disease in his brother and sister; Cancer in his brother, sister, and sister; Diabetes in his brother, sister, and son; Heart attack in his father and sister; Heart disease in his brother, brother, brother,  brother, brother, brother, brother, and father. There is no history of Anesthesia problems.  ROS:   Please see the history of present illness.     All other systems reviewed and are negative.   Prior CV studies:   The following studies were reviewed today:  NA  Labs/Other Tests and Data Reviewed:    EKG:  An ECG dated 03/28/19 was personally reviewed today and demonstrated:  Sinus rhythm without ischemic abnormalities nor arrhythmias  Recent Labs: 03/27/2019: ALT 18 03/28/2019: BUN 18; Creatinine, Ser 1.22; Hemoglobin 15.8; Platelets 191; Potassium 4.3; Sodium 132   Recent Lipid Panel Lab Results  Component Value Date/Time   CHOL 137 03/22/2019 07:59 AM   CHOL 144 05/30/2012 09:09 AM   TRIG 75 03/22/2019 07:59 AM  TRIG 140 05/20/2014 11:03 AM   TRIG 153 (H) 05/30/2012 09:09 AM   HDL 61 03/22/2019 07:59 AM   HDL 42 05/20/2014 11:03 AM   HDL 36 (L) 05/30/2012 09:09 AM   CHOLHDL 2.2 03/22/2019 07:59 AM   CHOLHDL 2.3 01/01/2013 09:27 AM   LDLCALC 61 03/22/2019 07:59 AM   LDLCALC 77 05/30/2012 09:09 AM    Wt Readings from Last 3 Encounters:  04/05/19 181 lb (82.1 kg)  03/28/19 183 lb (83 kg)  03/26/19 190 lb (86.2 kg)     Objective:    Vital Signs:  BP 107/68   Pulse 77   Ht 6' (1.829 m)   Wt 181 lb (82.1 kg)   BMI 24.55 kg/m    VITAL SIGNS:  reviewed  ASSESSMENT & PLAN:    1.  Chest discomfort: Symptoms have mixed features.  Troponins and ECG were normal.  He did have multivessel coronary calcifications on chest CT seen on 01/31/2019.  He also had a moderate to large hiatal hernia which may be the cause of his symptoms.  A nuclear stress test would likely result in excessive soft tissue attenuation due to this hiatal hernia.  In order to rule out obstructive coronary artery disease, I will proceed with coronary CT angiography.  2.  PSVT/PAF: Symptomatically stable and without recurrence.  This was an isolated instance in the setting of acute pericarditis and thus  he was not placed on anticoagulation.  He is no longer on beta-blockers or calcium channel blockers.  3.  Coronary artery calcifications: See discussion #1.  4.  Hiatal hernia: See discussion #1.  Currently on omeprazole.  He denies GERD symptoms.  Should he have recurrence of chest discomfort which is not deemed cardiac in etiology, one could consider switching omeprazole to Protonix.   COVID-19 Education: The signs and symptoms of COVID-19 were discussed with the patient and how to seek care for testing (follow up with PCP or arrange E-visit).  The importance of social distancing was discussed today.  Time:   Today, I have spent 40 minutes with the patient with telehealth technology discussing the above problems.     Medication Adjustments/Labs and Tests Ordered: Current medicines are reviewed at length with the patient today.  Concerns regarding medicines are outlined above.   Tests Ordered: No orders of the defined types were placed in this encounter.   Medication Changes: No orders of the defined types were placed in this encounter.   Follow Up:  Virtual Visit  in 3 month(s)  Signed, Kate Sable, MD  04/05/2019 8:25 AM    [facility]

## 2019-04-06 ENCOUNTER — Telehealth: Payer: Medicare Other | Admitting: Cardiovascular Disease

## 2019-04-09 ENCOUNTER — Ambulatory Visit (HOSPITAL_COMMUNITY): Payer: Medicare Other

## 2019-04-09 ENCOUNTER — Encounter: Payer: Self-pay | Admitting: *Deleted

## 2019-04-09 ENCOUNTER — Telehealth: Payer: Self-pay | Admitting: Cardiovascular Disease

## 2019-04-09 NOTE — Addendum Note (Signed)
Addended by: Laurine Blazer on: 04/09/2019 11:52 AM   Modules accepted: Orders

## 2019-04-09 NOTE — Telephone Encounter (Signed)
Informed [name] (wife) that it may take several weeks to get this test scheduled as they only have the one machine at [facility].  They will also be receiving the after visit summary & ct instructions in the mail soon.  Wife verbalized understanding.

## 2019-04-09 NOTE — Telephone Encounter (Signed)
Anthony Kelly called wanting to know if the CT Coronary test has been scheduled yet.

## 2019-04-09 NOTE — Patient Instructions (Signed)
Medication Instructions:  Continue all current medications.  Labwork:  BMET - order enclosed.   Please do this lab at [facility] or [place] across the street.   Do 3-4 days prior to CT  Testing/Procedures:  Your physician has requested that you have cardiac CT. Cardiac computed tomography (CT) is a painless test that uses an x-ray machine to take clear, detailed pictures of your heart. For further information please visit [email or web address]. Please follow instruction sheet as given.  Office will contact with results via phone or letter.    This may take several weeks to get scheduled as [facility] only has the one machine.   Follow-Up: 3 months   Any Other Special Instructions Will Be Listed Below (If Applicable).  If you need a refill on your cardiac medications before your next appointment, please call your pharmacy.

## 2019-04-10 ENCOUNTER — Other Ambulatory Visit: Payer: Self-pay

## 2019-04-10 ENCOUNTER — Inpatient Hospital Stay: Payer: Medicare Other

## 2019-04-10 ENCOUNTER — Encounter: Payer: Self-pay | Admitting: Hematology and Oncology

## 2019-04-10 ENCOUNTER — Inpatient Hospital Stay (HOSPITAL_BASED_OUTPATIENT_CLINIC_OR_DEPARTMENT_OTHER): Payer: Medicare Other | Admitting: Hematology and Oncology

## 2019-04-10 DIAGNOSIS — I2510 Atherosclerotic heart disease of native coronary artery without angina pectoris: Secondary | ICD-10-CM | POA: Diagnosis not present

## 2019-04-10 DIAGNOSIS — C7800 Secondary malignant neoplasm of unspecified lung: Secondary | ICD-10-CM | POA: Diagnosis not present

## 2019-04-10 DIAGNOSIS — R1013 Epigastric pain: Secondary | ICD-10-CM | POA: Diagnosis not present

## 2019-04-10 DIAGNOSIS — C8280 Other types of follicular lymphoma, unspecified site: Secondary | ICD-10-CM

## 2019-04-10 DIAGNOSIS — M545 Low back pain: Secondary | ICD-10-CM | POA: Diagnosis not present

## 2019-04-10 DIAGNOSIS — C8295 Follicular lymphoma, unspecified, lymph nodes of inguinal region and lower limb: Secondary | ICD-10-CM | POA: Diagnosis not present

## 2019-04-10 DIAGNOSIS — G893 Neoplasm related pain (acute) (chronic): Secondary | ICD-10-CM | POA: Diagnosis not present

## 2019-04-10 DIAGNOSIS — Z9221 Personal history of antineoplastic chemotherapy: Secondary | ICD-10-CM | POA: Diagnosis not present

## 2019-04-10 LAB — CBC WITH DIFFERENTIAL/PLATELET
Abs Immature Granulocytes: 0.02 10*3/uL (ref 0.00–0.07)
Basophils Absolute: 0.1 10*3/uL (ref 0.0–0.1)
Basophils Relative: 1 %
Eosinophils Absolute: 0.3 10*3/uL (ref 0.0–0.5)
Eosinophils Relative: 5 %
HCT: 43.3 % (ref 39.0–52.0)
Hemoglobin: 14.9 g/dL (ref 13.0–17.0)
Immature Granulocytes: 0 %
Lymphocytes Relative: 24 %
Lymphs Abs: 1.5 10*3/uL (ref 0.7–4.0)
MCH: 30.7 pg (ref 26.0–34.0)
MCHC: 34.4 g/dL (ref 30.0–36.0)
MCV: 89.1 fL (ref 80.0–100.0)
Monocytes Absolute: 0.7 10*3/uL (ref 0.1–1.0)
Monocytes Relative: 11 %
Neutro Abs: 3.5 10*3/uL (ref 1.7–7.7)
Neutrophils Relative %: 59 %
Platelets: 154 10*3/uL (ref 150–400)
RBC: 4.86 MIL/uL (ref 4.22–5.81)
RDW: 14.2 % (ref 11.5–15.5)
WBC: 5.9 10*3/uL (ref 4.0–10.5)
nRBC: 0 % (ref 0.0–0.2)

## 2019-04-10 LAB — COMPREHENSIVE METABOLIC PANEL
ALT: 20 U/L (ref 0–44)
AST: 18 U/L (ref 15–41)
Albumin: 3.8 g/dL (ref 3.5–5.0)
Alkaline Phosphatase: 63 U/L (ref 38–126)
Anion gap: 9 (ref 5–15)
BUN: 12 mg/dL (ref 8–23)
CO2: 24 mmol/L (ref 22–32)
Calcium: 8.9 mg/dL (ref 8.9–10.3)
Chloride: 105 mmol/L (ref 98–111)
Creatinine, Ser: 1.11 mg/dL (ref 0.61–1.24)
GFR calc Af Amer: >60 mL/min (ref >60)
GFR calc non Af Amer: >60 mL/min (ref >60)
Glucose, Bld: 116 mg/dL — ABNORMAL HIGH (ref 70–99)
Potassium: 5 mmol/L (ref 3.5–5.1)
Sodium: 138 mmol/L (ref 135–145)
Total Bilirubin: 0.5 mg/dL (ref 0.3–1.2)
Total Protein: 6.6 g/dL (ref 6.5–8.1)

## 2019-04-10 NOTE — Assessment & Plan Note (Signed)
He has nonspecific lower back pain but denies abdominal pain The pain is peculiar He is getting better and oxycodone is helping We will continue pain management for now

## 2019-04-10 NOTE — Progress Notes (Signed)
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 CT CHEST W CONTRAST    Standing Status:   Future    Standing Expiration Date:   04/09/2020    Order Specific Question:   If indicated for the ordered procedure, I authorize the administration of contrast media per Radiology protocol    Answer:   Yes    Order Specific Question:   Preferred imaging location?    Answer:   [facility]    Order Specific Question:   Radiology Contrast Protocol - do NOT remove file path    Answer:   \\[email or web address]    Order Specific Question:   ** REASON FOR EXAM (FREE TEXT)    Answer:   on chemo, assess response to treatment  . CT ABDOMEN PELVIS W CONTRAST    Standing Status:   Future    Standing Expiration Date:   04/09/2020    Order Specific Question:   If indicated for the ordered procedure, I authorize the administration of contrast media per Radiology protocol    Answer:   Yes    Order Specific Question:   Preferred imaging location?    Answer:   [facility]    Order Specific Question:   Radiology Contrast Protocol - do NOT remove file path    Answer:   \\[email or web address]    Order Specific Question:   ** REASON FOR EXAM (FREE TEXT)    Answer:   on chemo, assess response to treatment    All questions were answered. The patient knows to call the clinic with any problems, [name], [name], MD 04/10/2019 12:49 PM  INTERVAL HISTORY: Please see below for problem oriented charting. He returns for chemotherapy and follow-up with his wife He saw his cardiologist recently who plan to order a coronary CT scan He continues to have intermittent nonspecific back pain on the rib cage area which is separate from his prior biopsy site Denies abdominal pain No recent nausea or changes in bowel habits No recent infection, fever or chills His pain is well controlled with oxycodone as needed  SUMMARY OF ONCOLOGIC HISTORY: Oncology History Overview Note  Low grade B-cell lymphoma   Primary site: Lymphoid Neoplasms   Staging method: AJCC 6th Edition   Clinical: Stage IV signed by [name], MD on 09/26/2013  9:15 AM   Summary: Stage IV      Follicular low grade  B-cell lymphoma ([place])  12/10/2003 Surgery   Inguinal lymph node biopsy showed follicular lymphoma.   12/12/2003 - 06/01/2004 Chemotherapy   He was treated with R. CHOP chemotherapy which show complete remission. The number of cycles of R. CHOP chemotherapy was unknown.   12/19/2006 Surgery   Lung resection show follicular lymphoma.   01/02/2007 - 09/01/2008 Chemotherapy   The patient was treated with single agent rituximab alone.   01/19/2007 Bone Marrow Biopsy   Bone marrow biopsy was negative.   04/30/2011 Surgery   Submandibular lymph node  biopsy showed follicular lymphoma.   05/08/2013 - 05/11/2013 Hospital Admission   The patient was admitted to the hospital for management of pericarditis. CT scan showed extensive lymphadenopathy.   06/07/2013 Imaging   PET/CT scan showed extensive lymphadenopathy   06/25/2013 Procedure   He has placement of Infuse-a-Port.   06/28/2013 Bone Marrow Biopsy   Bone marrow biopsy is positive for lymphoma involvement.   07/04/2013 - 11/22/2013 Chemotherapy   He is treated with 6 cycles of bendamustine with rituximab.   09/24/2013 Imaging   Repeat PET scan show complete remission.   12/26/2013 Imaging   PEt scan showed complete remission   09/26/2014 Imaging   CT scan of the chest abdomen and pelvis show no evidence of disease   03/26/2015 Imaging   CT scan showed no evidence of lymphoma   04/14/2016 Imaging   CT: Borderline prominent right hilar and subcarinal lymph nodes, but not appreciably changed. 2. Low-grade but increased central mesenteric stranding with some small mesenteric lymph nodes. This could certainly be inflammatory, 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, [name], volume estimated at 75 cubic cm.   02/15/2017 Imaging   No evidence of recurrent lymphoma or other acute findings.  Stable moderate hiatal hernia.  Colonic diverticulosis, without radiographic evidence of diverticulitis.  Stable mildly enlarged prostate.  Mild emphysema.  Aortic and coronary artery atherosclerosis.    01/31/2019 Imaging   1. Interval development of abdominal and pelvic adenopathy compatible with recurrent lymphoma. 2. Emphysema and aortic atherosclerosis. 3. Multi vessel coronary artery calcifications. 4. Moderate to large hiatal hernia   02/09/2019 Procedure   Successful CT-guided core biopsy of the left retroperitoneal periaortic adenopathy   02/09/2019 Pathology Results   SURGICAL PATHOLOGY  CASE: [id number]  PATIENT: [name]  Surgical Pathology Report   Clinical History: Lymphoma; [name], LEFT PARA AORTIC, [name], cleaved nuclei and scant cytoplasm.  By immunohistochemistry, [name], [name], [name], [name] is low (less than 10%).  Flow cytometry was attempted; however, there was insufficient material for  analysis (see [id number]).  Overall, the features are consistent with relapse of the patient's previously diagnosed follicular lymphoma.  Based on the biopsy, this is favored to be a low-grade follicular lymphoma   01/20/6220 -  Chemotherapy   The patient had idelisib for chemotherapy treatment.       REVIEW OF SYSTEMS:   Constitutional: Denies fevers, chills or abnormal weight loss Eyes: Denies blurriness of vision Ears, nose, mouth, throat, and face: Denies mucositis or sore throat Respiratory: Denies cough, dyspnea or wheezes Cardiovascular: Denies palpitation, chest discomfort or lower extremity swelling Gastrointestinal:  Denies  nausea, heartburn or change in bowel habits Skin: Denies abnormal skin rashes Lymphatics: Denies new lymphadenopathy or easy bruising Neurological:Denies numbness, tingling or new weaknesses Behavioral/Psych: Mood is stable, no new changes  All other systems were reviewed with the patient and are negative.  I have reviewed the past medical history, past surgical history, social history and family history with the patient and they are unchanged from previous note.  ALLERGIES:  has No Known Allergies.  MEDICATIONS:  Current Outpatient Medications  Medication Sig Dispense Refill  . acyclovir (ZOVIRAX) 400 MG tablet Take 1 tablet (400 mg total) by mouth 2 (two) times daily. 180 tablet 6  . allopurinol (ZYLOPRIM) 300 MG tablet Take 1 tablet (300 mg total) by mouth daily. 30 tablet 2  . brimonidine (ALPHAGAN) 0.2 % ophthalmic solution Place 1 drop into both eyes 2 (two) times daily.     . idelalisib (ZYDELIG) 150 MG tablet Take 1 tablet (150 mg total) by mouth 2 (two) times daily. 60 tablet 11  . LUMIGAN 0.01 % SOLN Place 1 drop into both eyes daily.    . Magnesium 250 MG TABS Take 1 tablet by mouth daily.    . Multiple Vitamin (MULTIVITAMIN WITH MINERALS) TABS tablet Take 1 tablet by mouth every morning.     . NON FORMULARY Take by mouth daily. Beet Root    . omeprazole (PRILOSEC) 20 MG capsule Take 1 capsule (20 mg total) by mouth daily. 90 capsule 3  . ondansetron (ZOFRAN) 8 MG tablet Take 1 tablet (8 mg total) by mouth every 8 (eight) hours as needed for nausea. 30 tablet 3  . oxyCODONE (OXY IR/ROXICODONE) 5 MG immediate release tablet Take 1 tablet (5 mg total) by mouth every 4 (four) hours as needed for severe pain. 60 tablet 0  . pravastatin (PRAVACHOL) 20 MG tablet Take 1 tablet (20 mg total) by mouth daily. 90 tablet 3  . Probiotic Product (PROBIOTIC DAILY PO) Take 1 capsule by mouth daily.     . prochlorperazine (COMPAZINE) 10 MG tablet Take 1 tablet (10 mg total) by mouth every 6  (six) hours as needed for nausea or vomiting. 30 tablet 0   No current facility-administered medications for this visit.    PHYSICAL EXAMINATION: ECOG PERFORMANCE STATUS: 1 - Symptomatic but completely ambulatory  Vitals:   04/10/19 1205  BP: 111/78  Pulse: 68  Resp: 18  Temp: 98.2 F (36.8 C)  SpO2: 97%   Filed Weights   04/10/19 1205  Weight: 189 lb 12.8 oz (86.1 kg)    GENERAL:alert, no distress and comfortable SKIN: skin color, texture, turgor are normal, no rashes or significant lesions EYES: normal, Conjunctiva are pink and non-injected, sclera clear OROPHARYNX:no exudate, no erythema and lips, buccal mucosa, and tongue normal  NECK: supple, thyroid normal size, non-tender, without nodularity LYMPH:  no palpable lymphadenopathy in the cervical, axillary or inguinal LUNGS: clear to auscultation and  percussion with normal breathing effort HEART: regular rate & rhythm and no murmurs and no lower extremity edema ABDOMEN:abdomen soft, non-tender and normal bowel sounds Musculoskeletal:no cyanosis of digits and no clubbing  NEURO: alert & oriented x 3 with fluent speech, no focal motor/sensory deficits  LABORATORY DATA:  I have reviewed the data as listed    Component Value Date/Time   NA 138 04/10/2019 1145   NA 142 03/22/2019 0759   NA 139 04/14/2016 0943   K 5.0 04/10/2019 1145   K 4.6 04/14/2016 0943   CL 105 04/10/2019 1145   CL 104 03/24/2012 1024   CO2 24 04/10/2019 1145   CO2 25 04/14/2016 0943   GLUCOSE 116 (H) 04/10/2019 1145   GLUCOSE 111 04/14/2016 0943   GLUCOSE 80 03/24/2012 1024   BUN 12 04/10/2019 1145   BUN 15 03/22/2019 0759   BUN 8.6 04/14/2016 0943   CREATININE 1.11 04/10/2019 1145   CREATININE 1.1 04/14/2016 0943   CALCIUM 8.9 04/10/2019 1145   CALCIUM 9.4 04/14/2016 0943   PROT 6.6 04/10/2019 1145   PROT 6.6 03/22/2019 0759   PROT 6.5 04/14/2016 0943   ALBUMIN 3.8 04/10/2019 1145   ALBUMIN 4.6 03/22/2019 0759   ALBUMIN 3.8 04/14/2016  0943   AST 18 04/10/2019 1145   AST 28 04/14/2016 0943   ALT 20 04/10/2019 1145   ALT 41 04/14/2016 0943   ALKPHOS 63 04/10/2019 1145   ALKPHOS 71 04/14/2016 0943   BILITOT 0.5 04/10/2019 1145   BILITOT 0.4 03/22/2019 0759   BILITOT 0.61 04/14/2016 0943   GFRNONAA >60 04/10/2019 1145   GFRAA >60 04/10/2019 1145    No results found for: SPEP, UPEP  Lab Results  Component Value Date   WBC 5.9 04/10/2019   NEUTROABS 3.5 04/10/2019   HGB 14.9 04/10/2019   HCT 43.3 04/10/2019   MCV 89.1 04/10/2019   PLT 154 04/10/2019      Chemistry      Component Value Date/Time   NA 138 04/10/2019 1145   NA 142 03/22/2019 0759   NA 139 04/14/2016 0943   K 5.0 04/10/2019 1145   K 4.6 04/14/2016 0943   CL 105 04/10/2019 1145   CL 104 03/24/2012 1024   CO2 24 04/10/2019 1145   CO2 25 04/14/2016 0943   BUN 12 04/10/2019 1145   BUN 15 03/22/2019 0759   BUN 8.6 04/14/2016 0943   CREATININE 1.11 04/10/2019 1145   CREATININE 1.1 04/14/2016 0943      Component Value Date/Time   CALCIUM 8.9 04/10/2019 1145   CALCIUM 9.4 04/14/2016 0943   ALKPHOS 63 04/10/2019 1145   ALKPHOS 71 04/14/2016 0943   AST 18 04/10/2019 1145   AST 28 04/14/2016 0943   ALT 20 04/10/2019 1145   ALT 41 04/14/2016 0943   BILITOT 0.5 04/10/2019 1145   BILITOT 0.4 03/22/2019 0759   BILITOT 0.61 04/14/2016 0943       RADIOGRAPHIC STUDIES: I have personally reviewed the radiological images as listed and agreed with the findings in the report. DG Chest 2 View  Result Date: 03/28/2019 CLINICAL DATA:  Chest pain. Shortness of breath. EXAM: CHEST - 2 VIEW COMPARISON:  CT 01/31/2019 FINDINGS: Chronic hyperinflation. Postsurgical change in the right perihilar region with surgical clips and chain sutures. Mild chronic interstitial coarsening. Normal heart size with unchanged mediastinal contours. Hiatal hernia with air-fluid level. No acute airspace disease, pulmonary edema, pleural effusion or pneumothorax. No acute  osseous abnormalities are seen. IMPRESSION: Chronic  hyperinflation and postsurgical change in the right hemithorax. Chronic interstitial coarsening related emphysema. No acute abnormality. Electronically Signed   By: Keith Rake M.D.   On: 03/28/2019 19:36

## 2019-04-10 NOTE — Assessment & Plan Note (Signed)
So far, he tolerated treatment very well The cause of his abdominal pain or back pain is unknown I recommend he continue taking his treatment the same way I plan to repeat CT imaging on May 6 for objective assessment of response of therapy

## 2019-04-11 ENCOUNTER — Telehealth: Payer: Self-pay | Admitting: Hematology and Oncology

## 2019-04-11 NOTE — Telephone Encounter (Signed)
Scheduled per 3/30 sch msg. Called and spoke with pt, confirmed 5/6 and 5/7 appts

## 2019-04-15 ENCOUNTER — Encounter: Payer: Self-pay | Admitting: Hematology and Oncology

## 2019-04-17 ENCOUNTER — Encounter: Payer: Self-pay | Admitting: Hematology and Oncology

## 2019-04-17 MED FILL — ZYDELIG 150 MG TABLET: 150 | 30 days supply | Qty: 60 | Fill #2

## 2019-04-18 ENCOUNTER — Encounter: Payer: Self-pay | Admitting: Hematology and Oncology

## 2019-04-18 DIAGNOSIS — H401131 Primary open-angle glaucoma, bilateral, mild stage: Secondary | ICD-10-CM | POA: Diagnosis not present

## 2019-04-18 DIAGNOSIS — Z961 Presence of intraocular lens: Secondary | ICD-10-CM | POA: Diagnosis not present

## 2019-04-19 ENCOUNTER — Telehealth: Payer: Self-pay

## 2019-04-19 ENCOUNTER — Encounter: Payer: Self-pay | Admitting: Hematology and Oncology

## 2019-04-19 NOTE — Telephone Encounter (Signed)
Wife informed and verbalized understanding of plan.

## 2019-04-19 NOTE — Telephone Encounter (Signed)
Sure

## 2019-04-19 NOTE — Telephone Encounter (Signed)
Informed wife that 30 day cardiac monitor was ordered on 04/18/2019. Number for [employer] sent through mychart to check status.  Per wife, patient has toprol xl 25 mg at home that he took previously. HR 60-82 but says its skipping. Last Friday HR was 130. Reports HR being irregular overnight a couple of night ago. Advised that message would be sent to provider.

## 2019-04-19 NOTE — Telephone Encounter (Signed)
Per wife Pt having fast heart rate and she would like to know if she can give him a toprol. Pt was to wear a heart monitor, but he has not rec'd the monitor yet.  Please call [phone]   Thanks renee

## 2019-04-23 ENCOUNTER — Telehealth: Payer: Self-pay

## 2019-04-23 DIAGNOSIS — Z23 Encounter for immunization: Secondary | ICD-10-CM | POA: Diagnosis not present

## 2019-04-23 NOTE — Telephone Encounter (Signed)
Refill request for Oxycodone 5mg .  Last Rx given 3/16.  Pt's wife reports they have enough for 3-4 more days.  Will forward to MD for review.

## 2019-04-24 ENCOUNTER — Ambulatory Visit (INDEPENDENT_AMBULATORY_CARE_PROVIDER_SITE_OTHER): Payer: Medicare Other

## 2019-04-24 ENCOUNTER — Other Ambulatory Visit: Payer: Self-pay | Admitting: Hematology and Oncology

## 2019-04-24 DIAGNOSIS — R000 Tachycardia, unspecified: Secondary | ICD-10-CM

## 2019-04-24 MED ORDER — OXYCODONE HCL 5 MG PO TABS: 5.0000 mg | ORAL_TABLET | ORAL | 0 refills | Status: DC | PRN

## 2019-04-24 NOTE — Telephone Encounter (Signed)
I sent refill to [facility]

## 2019-04-24 NOTE — Telephone Encounter (Signed)
Called and given below message. He verbalized understanding. 

## 2019-04-25 ENCOUNTER — Other Ambulatory Visit: Payer: Self-pay | Admitting: *Deleted

## 2019-04-25 ENCOUNTER — Telehealth (HOSPITAL_COMMUNITY): Payer: Self-pay | Admitting: Emergency Medicine

## 2019-04-25 ENCOUNTER — Other Ambulatory Visit (HOSPITAL_COMMUNITY): Payer: Self-pay | Admitting: Emergency Medicine

## 2019-04-25 ENCOUNTER — Telehealth: Payer: Self-pay

## 2019-04-25 DIAGNOSIS — R000 Tachycardia, unspecified: Secondary | ICD-10-CM

## 2019-04-25 MED ORDER — METOPROLOL TARTRATE 50 MG PO TABS: 100.0000 mg | ORAL_TABLET | Freq: Once | ORAL | 0 refills | Status: DC

## 2019-04-25 NOTE — Telephone Encounter (Signed)
Phone call to patient confirming that [facility] has his rx for metoprolol tartrate ready to be picked up (along with other med). Wife appreciated the call. Denies having questions about cardiac CT instructions for tomorrows exam  Marchia Bond RN Navigator Cardiac Imaging [facility] Heart and Vascular Services [phone] Office  [phone] Cell

## 2019-04-25 NOTE — Telephone Encounter (Signed)
Per Spouse Pt has a CT tomorrow, however Pt does not have lopressor to take before testing.  Please call MsLawson [phone]  Thanks renee

## 2019-04-25 NOTE — Telephone Encounter (Signed)
50-100 mg Lopressor

## 2019-04-25 NOTE — Telephone Encounter (Signed)
Will ask Bronson Ing (ordering MD) or Agbor-Etang (reading MD) what they prefer regarding pre-medicating for CCTA tomorrow.  Last office visit HR 68, BP 111/78  Marchia Bond RN Navigator Cardiac Imaging [facility] and Vascular Services [phone] Office  [phone] Cell

## 2019-04-25 NOTE — Progress Notes (Signed)
Verbal order for PO 50-100mg  metoprolol tartrate to be taken 2 hr prior to cardiac CTA appt tomorrow.  Will call wife and inform her specific instructions  Marchia Bond RN Navigator Cardiac Imaging [facility] Heart and Vascular Services [phone] Office  [phone] Cell

## 2019-04-26 ENCOUNTER — Encounter: Payer: Self-pay | Admitting: Hematology and Oncology

## 2019-04-26 ENCOUNTER — Other Ambulatory Visit: Payer: Self-pay

## 2019-04-26 ENCOUNTER — Ambulatory Visit
Admission: RE | Admit: 2019-04-26 | Discharge: 2019-04-26 | Disposition: A | Discharge status: Home / Self Care | Payer: Medicare Other | Source: Ambulatory Visit | Attending: Cardiovascular Disease | Admitting: Cardiovascular Disease

## 2019-04-26 DIAGNOSIS — K449 Diaphragmatic hernia without obstruction or gangrene: Secondary | ICD-10-CM | POA: Insufficient documentation

## 2019-04-26 DIAGNOSIS — I2510 Atherosclerotic heart disease of native coronary artery without angina pectoris: Secondary | ICD-10-CM | POA: Insufficient documentation

## 2019-04-26 DIAGNOSIS — R072 Precordial pain: Secondary | ICD-10-CM | POA: Diagnosis not present

## 2019-04-26 DIAGNOSIS — I700 Atherosclerosis of aorta: Secondary | ICD-10-CM | POA: Diagnosis not present

## 2019-04-26 IMAGING — CT CT HEART MORP W/ CTA COR W/ SCORE W/ CA W/CM &/OR W/O CM
2 of 9 series · 9 of 20 positions shown, 11 images · non-contrast
Comparison: [DATE] diagnostic chest CT.

Addendum:
CLINICAL DATA: Hx of chestpain. Hx of coronary calcifications on
chest ct.

EXAM:
Cardiac/Coronary  CTA
TECHNIQUE: The patient was scanned on a Siemens Somatoform go.Top scanner.

[Series 5: multiphase % cta coronary 0.60 · axial · 0.46mm/px · z∈[-1126,-1064]mm · 3 of 2184 slices shown]
[im 546/2184  vessel]
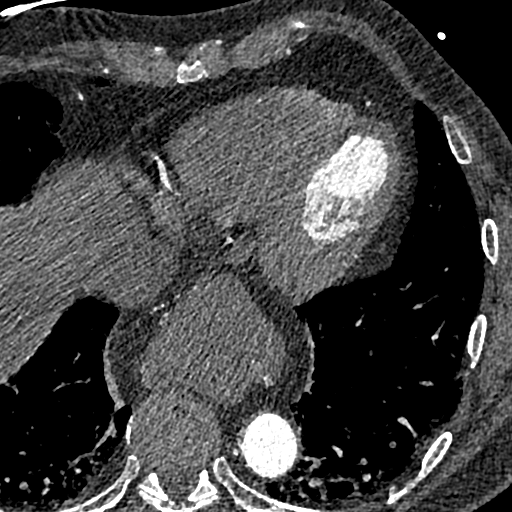
[im 1092/2184  vessel]
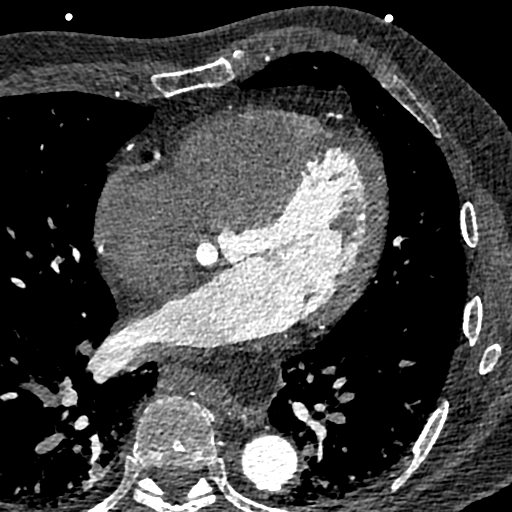
[im 1638/2184  vessel]
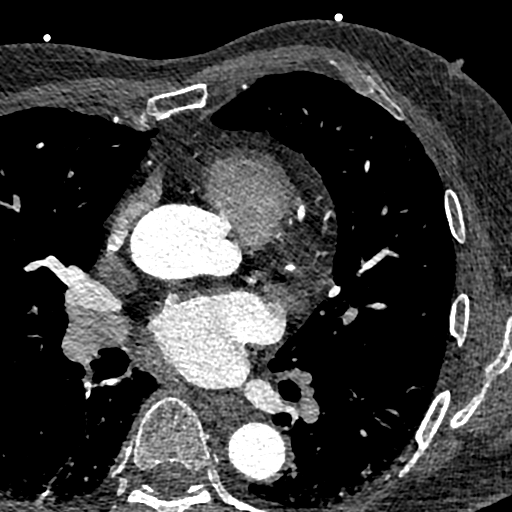

[Series 44: ms multiphase cta coronary 0.60 · axial · 0.46mm/px · z∈[-1139,-1050]mm · 6 of 2808 slices shown, 8 images]
[im 402/2808  vessel]
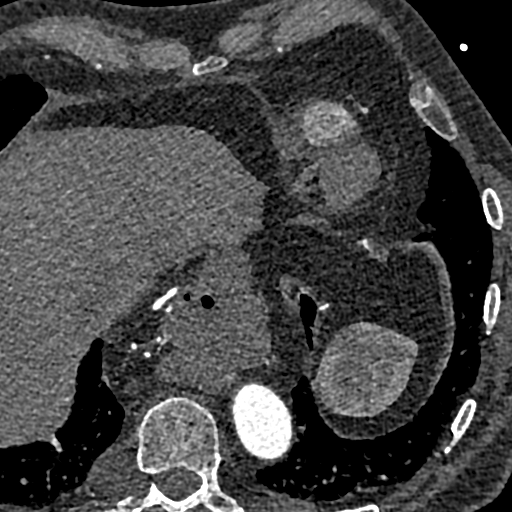
[im 402/2808  lung]
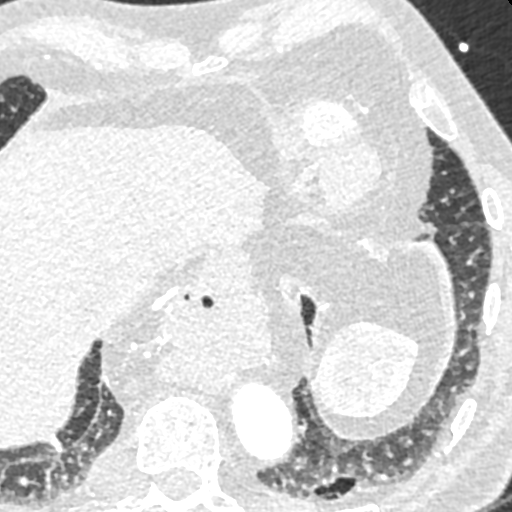
[im 803/2808  vessel]
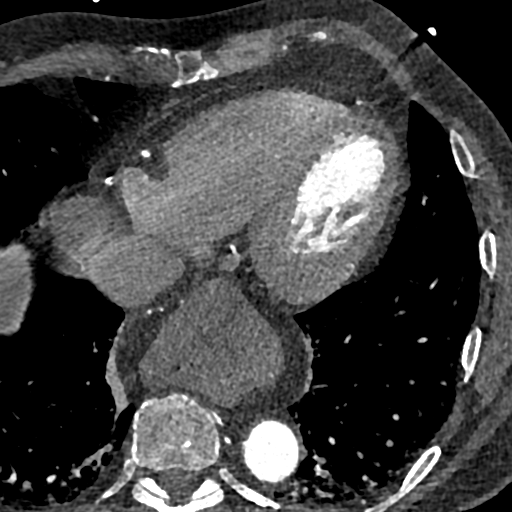
[im 1204/2808  vessel]
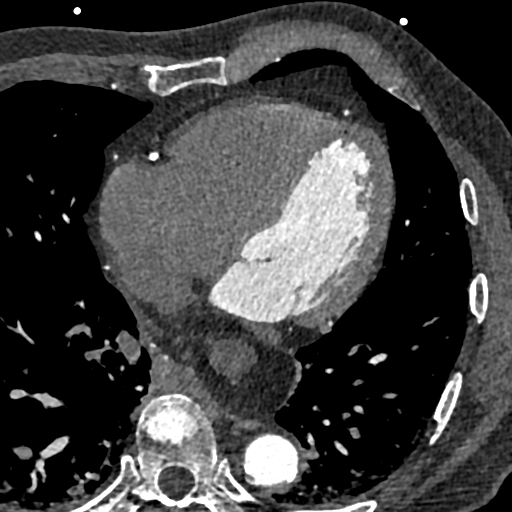
[im 1605/2808  vessel]
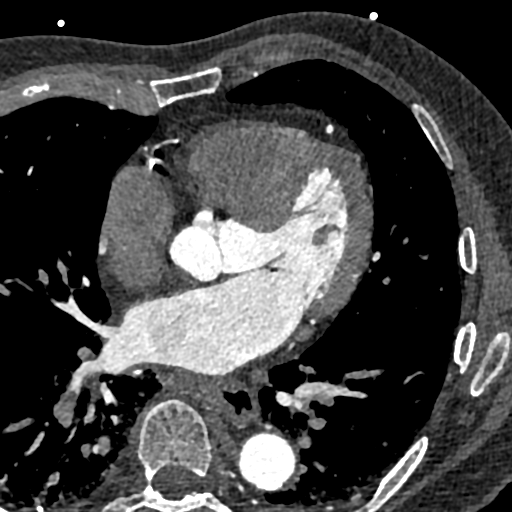
[im 2006/2808  vessel]
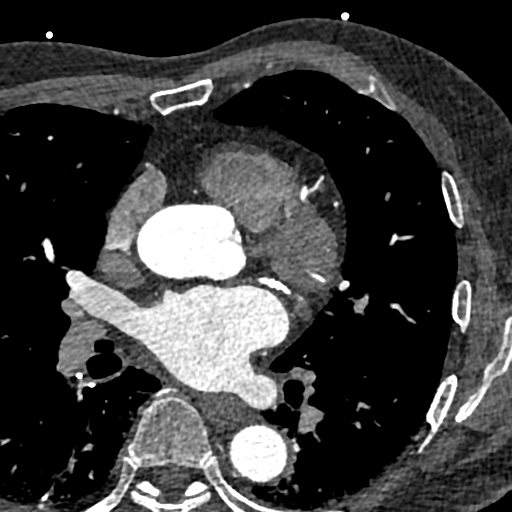
[im 2006/2808  lung]
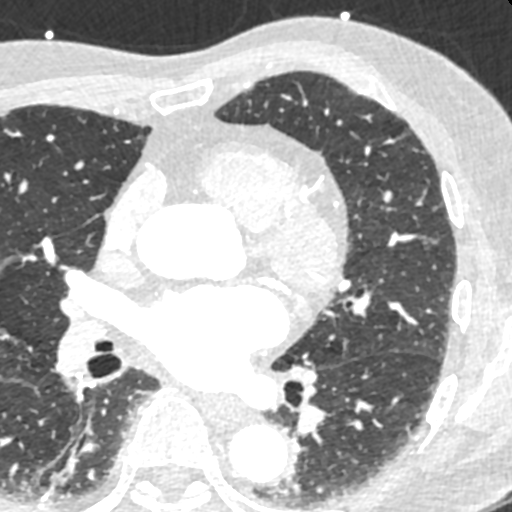
[im 2407/2808  vessel]
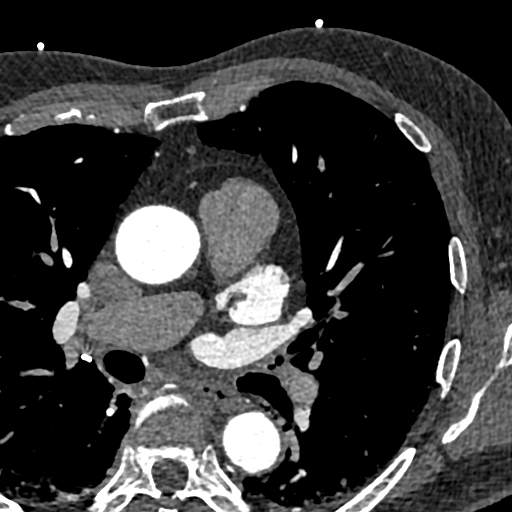

[9 of 20 positions shown; findings below may reference images not displayed]

FINDINGS: A retrospective scan was triggered in the descending thoracic aorta.
Axial non-contrast 3 mm slices were carried out through the heart.
The data set was analyzed on a dedicated work station and scored
using the Agatson method. Gantry rotation speed was 330 msecs and
collimation was .6 mm. 100mg of metoprolol and 0.8 mg of sl NTG was
given. The 3D data set was reconstructed in 5% intervals of the
50-95 % of the R-R cycle. Diastolic phases were analyzed on a
dedicated work station using MPR, MIP and VRT modes. The patient
received 75 cc of contrast.

Aorta: Normal size. Mild ascending aorta calcifications. No
dissection.

Aortic Valve:  Trileaflet.  No calcifications.

Coronary Arteries:  Normal coronary origin.  Right dominance.

RCA is a large dominant artery that gives rise to PDA and PLA. There
is calcified plaque in the mid vessel causing severe stenosis
(70-99%). Calcified plaque in the distal RCA causing mid stenosis
(25-49%).

Left main is a large artery that gives rise to LAD and LCX arteries.

LAD is a large vessel that has diffuse calcified plaque in the
proximal to mid segments causing mild stenosis (25-49%) proximally
and severe stenosis in the mid segment (70-99%).

LCX is a non-dominant artery that gives rise to one large OM1
branch. There is calcified plaque in the mid LCx causing mild
stenosis (25-49%).

Other findings:

Normal pulmonary vein drainage into the left atrium.

Normal left atrial appendage without a thrombus.

Normal size of the pulmonary artery.
IMPRESSION: 1. Coronary calcium score of [LY]. This was 88th percentile for age
and sex matched control.

2. Normal coronary origin with right dominance.

3.  Heavily calcified plaque in the mid LAD causing severe stenosis

4.  Heavily calcified plaque in the mid RCA causing severe stenosis.

5. CAD-RADS 4 Severe stenosis. (70-99% or > 50% left main). Cardiac
catheterization or CT FFR is recommended. Consider symptom-guided
anti-ischemic pharmacotherapy as well as risk factor modification
per guideline directed care. Additional analysis with CT FFR will be
submitted and reported separately.

ADDENDUM:
OVER-READ INTERPRETATION  CT CHEST

The following report is an over-read performed by radiologist Dr.
does not include interpretation of cardiac or coronary anatomy or
pathology. The coronary CTA interpretation by the cardiologist is
attached.
FINDINGS: No pleural fluid. Surgical sutures in the right lower lobe.
Centrilobular emphysema.

Aortic atherosclerosis. No imaged thoracic adenopathy. Moderate
hiata hernia.

High left hepatic lobe cyst. Normal imaged portions of the spleen,
stomach.

No acute osseous abnormality.
IMPRESSION: 1.  No acute findings in the imaged extracardiac chest.
2.  Aortic Atherosclerosis ([LY]-[LY]).
3. Moderate hiatal hernia.

*** End of Addendum ***
FINDINGS: A retrospective scan was triggered in the descending thoracic aorta.
Axial non-contrast 3 mm slices were carried out through the heart.
The data set was analyzed on a dedicated work station and scored
using the Agatson method. Gantry rotation speed was 330 msecs and
collimation was .6 mm. 100mg of metoprolol and 0.8 mg of sl NTG was
given. The 3D data set was reconstructed in 5% intervals of the
50-95 % of the R-R cycle. Diastolic phases were analyzed on a
dedicated work station using MPR, MIP and VRT modes. The patient
received 75 cc of contrast.

Aorta: Normal size. Mild ascending aorta calcifications. No
dissection.

Aortic Valve:  Trileaflet.  No calcifications.

Coronary Arteries:  Normal coronary origin.  Right dominance.

RCA is a large dominant artery that gives rise to PDA and PLA. There
is calcified plaque in the mid vessel causing severe stenosis
(70-99%). Calcified plaque in the distal RCA causing mid stenosis
(25-49%).

Left main is a large artery that gives rise to LAD and LCX arteries.

LAD is a large vessel that has diffuse calcified plaque in the
proximal to mid segments causing mild stenosis (25-49%) proximally
and severe stenosis in the mid segment (70-99%).

LCX is a non-dominant artery that gives rise to one large OM1
branch. There is calcified plaque in the mid LCx causing mild
stenosis (25-49%).

Other findings:

Normal pulmonary vein drainage into the left atrium.

Normal left atrial appendage without a thrombus.

Normal size of the pulmonary artery.
IMPRESSION: 1. Coronary calcium score of [LY]. This was 88th percentile for age
and sex matched control.

2. Normal coronary origin with right dominance.

3.  Heavily calcified plaque in the mid LAD causing severe stenosis

4.  Heavily calcified plaque in the mid RCA causing severe stenosis.

5. CAD-RADS 4 Severe stenosis. (70-99% or > 50% left main). Cardiac
catheterization or CT FFR is recommended. Consider symptom-guided
anti-ischemic pharmacotherapy as well as risk factor modification
per guideline directed care. Additional analysis with CT FFR will be
submitted and reported separately.

## 2019-04-26 MED ORDER — NITROGLYCERIN 0.4 MG SL SUBL
0.8000 mg | SUBLINGUAL_TABLET | Freq: Once | SUBLINGUAL | Status: AC
  Administered 2019-04-26: 0.8 mg via SUBLINGUAL

## 2019-04-26 MED ORDER — SODIUM CHLORIDE 0.9 % IV BOLUS
250.0000 mL | Freq: Once | INTRAVENOUS | Status: AC
  Administered 2019-04-26: 250 mL via INTRAVENOUS

## 2019-04-26 MED ORDER — IOHEXOL 350 MG/ML SOLN
75.0000 mL | Freq: Once | INTRAVENOUS | Status: AC | PRN
  Administered 2019-04-26: 75 mL via INTRAVENOUS

## 2019-04-27 ENCOUNTER — Encounter: Payer: Self-pay | Admitting: Hematology and Oncology

## 2019-04-27 ENCOUNTER — Telehealth: Payer: Self-pay | Admitting: *Deleted

## 2019-04-27 DIAGNOSIS — R931 Abnormal findings on diagnostic imaging of heart and coronary circulation: Secondary | ICD-10-CM | POA: Diagnosis not present

## 2019-04-27 DIAGNOSIS — R072 Precordial pain: Secondary | ICD-10-CM | POA: Diagnosis not present

## 2019-04-27 NOTE — Telephone Encounter (Signed)
-----   Message from [name], MD sent at 04/27/2019  1:56 PM EDT ----- Nonobstructive coronary artery disease.  Symptoms likely related to hiatal hernia.  He should follow-up with his PCP regarding this.

## 2019-04-27 NOTE — Telephone Encounter (Signed)
Laurine Blazer, [place]  06/04/8183 9:09 PM EDT    Patient & wife notified. Copy to pmd. Follow up scheduled for June.

## 2019-05-01 ENCOUNTER — Encounter: Payer: Self-pay | Admitting: Hematology and Oncology

## 2019-05-01 ENCOUNTER — Encounter: Payer: Self-pay | Admitting: Family Medicine

## 2019-05-02 MED ORDER — TAMSULOSIN HCL 0.4 MG PO CAPS: 0.4000 mg | ORAL_CAPSULE | Freq: Every day | ORAL | 3 refills | Status: DC

## 2019-05-07 ENCOUNTER — Encounter: Payer: Self-pay | Admitting: Hematology and Oncology

## 2019-05-07 ENCOUNTER — Telehealth: Payer: Self-pay | Admitting: *Deleted

## 2019-05-07 ENCOUNTER — Encounter: Payer: Self-pay | Admitting: Family Medicine

## 2019-05-07 NOTE — Telephone Encounter (Signed)
[employer] called with a critical report on pt - printed and given to Dr Bronson Ing

## 2019-05-08 ENCOUNTER — Encounter: Payer: Self-pay | Admitting: Cardiovascular Disease

## 2019-05-08 ENCOUNTER — Telehealth: Payer: Self-pay | Admitting: Cardiovascular Disease

## 2019-05-08 ENCOUNTER — Telehealth (INDEPENDENT_AMBULATORY_CARE_PROVIDER_SITE_OTHER): Payer: Medicare Other | Admitting: Cardiovascular Disease

## 2019-05-08 ENCOUNTER — Encounter: Payer: Self-pay | Admitting: Hematology and Oncology

## 2019-05-08 VITALS — BP 115/72 | HR 75 | Ht 72.0 in | Wt 183.0 lb

## 2019-05-08 DIAGNOSIS — I4891 Unspecified atrial fibrillation: Secondary | ICD-10-CM

## 2019-05-08 DIAGNOSIS — R079 Chest pain, unspecified: Secondary | ICD-10-CM | POA: Diagnosis not present

## 2019-05-08 DIAGNOSIS — Z7189 Other specified counseling: Secondary | ICD-10-CM

## 2019-05-08 DIAGNOSIS — E785 Hyperlipidemia, unspecified: Secondary | ICD-10-CM | POA: Diagnosis not present

## 2019-05-08 DIAGNOSIS — K449 Diaphragmatic hernia without obstruction or gangrene: Secondary | ICD-10-CM | POA: Diagnosis not present

## 2019-05-08 DIAGNOSIS — I2510 Atherosclerotic heart disease of native coronary artery without angina pectoris: Secondary | ICD-10-CM | POA: Diagnosis not present

## 2019-05-08 DIAGNOSIS — I2583 Coronary atherosclerosis due to lipid rich plaque: Secondary | ICD-10-CM | POA: Diagnosis not present

## 2019-05-08 MED ORDER — APIXABAN 5 MG PO TABS: 5.0000 mg | ORAL_TABLET | Freq: Two times a day (BID) | ORAL | 1 refills | Status: DC

## 2019-05-08 MED ORDER — METOPROLOL SUCCINATE ER 25 MG PO TB24: 25.0000 mg | ORAL_TABLET | Freq: Every day | ORAL | 1 refills | Status: DC

## 2019-05-08 NOTE — Progress Notes (Signed)
Virtual Visit via Telephone Note   This visit type was conducted due to national recommendations for restrictions regarding the COVID-19 Pandemic (e.g. social distancing) in an effort to limit this patient's exposure and mitigate transmission in our community.  Due to his co-morbid illnesses, this patient is at least at moderate risk for complications without adequate follow up.  This format is felt to be most appropriate for this patient at this time.  The patient did not have access to video technology/had technical difficulties with video requiring transitioning to audio format only (telephone).  All issues noted in this document were discussed and addressed.  No physical exam could be performed with this format.  Please refer to the patient's chart for his  consent to telehealth for [facility].   The patient was identified using 2 identifiers.  Date:  05/08/2019   ID:  Anthony Kelly, DOB 05/17/1945, MRN [id number]  Patient Location: Home Provider Location: Office  PCP:  Dettinger, [name], MD  Cardiologist:  Kate Sable, MD  Electrophysiologist:  None   Evaluation Performed:  Follow-Up Visit  Chief Complaint: Rapid atrial fibrillation  History of Present Illness:    Anthony Kelly is a 74 y.o. male with rapid atrial fibrillation.  I was notified yesterday by the company for his event monitor which demonstrated rapid atrial fibrillation with heart rates as high as 170-180 bpm.  I have recommended he start Toprol-XL 25 mg daily rather than as needed and start Eliquis 5 mg twice daily for systemic anticoagulation.  I notified him of this today.  He told me that he felt weak and short of breath at the time this occurred.  He denied associated chest pain.  He had been loading a truck and working outdoors at the time.  He has a known history of PSVT and paroxysmal atrial fibrillation.  However, this was an isolated instance in the setting of acute pericarditis several years  ago.  Coronary CT angiography demonstrated nonobstructive coronary artery disease in the LAD, left circumflex, and RCA.     Past Medical History:  Diagnosis Date  . Acute pericarditis    a. 04/2013 -adm with CP, elevated CRP. H/o coronary artery calcification on prior CT but nuc was negative, EF 71%.  . Arthritis   . Atrial fibrillation ([place])    a. Isolated episode in the setting of acute pericarditis 04/2013. Was not placed on anticoag.  Marland Kitchen BPH (benign prostatic hyperplasia)   . Cataract   . Diverticulitis 08/16/2013  . Hyperlipidemia    elevated triglycerides  . 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 Pain in joint, pelvic region and thigh 08/01/2013  . PSVT (paroxysmal supraventricular tachycardia) ([place])    Past Surgical History:  Procedure Laterality Date  . CHOLECYSTECTOMY    . EXPLORATORY LAPAROTOMY    . EYE SURGERY Right 2016   cataract  . LUNG LOBECTOMY     right side  . LYMPH NODE BIOPSY     in groin with removal  . MENISCUS REPAIR     right knee  . PROSTATE SURGERY    . REFRACTIVE  SURGERY Right    piece of metal removed  . SUBMANDIBULAR GLAND EXCISION  04/2011  . SUBMANDIBULAR GLAND EXCISION  04/30/2011   Procedure: EXCISION SUBMANDIBULAR GLAND;  Surgeon: Jerrell Belfast, MD;  Location: [facility];  Service: ENT;  Laterality: Left;  WITH DIAGNOSTIC BIOPSY  . TONSILLECTOMY     as a child  . VEIN LIGATION AND STRIPPING     right leg     Current Meds  Medication Sig  . acyclovir (ZOVIRAX) 400 MG tablet Take 1 tablet (400 mg total) by mouth 2 (two) times daily.  . brimonidine (ALPHAGAN) 0.2 %  ophthalmic solution Place 1 drop into both eyes 2 (two) times daily.   . idelalisib (ZYDELIG) 150 MG tablet Take 1 tablet (150 mg total) by mouth 2 (two) times daily.  Marland Kitchen LUMIGAN 0.01 % SOLN Place 1 drop into both eyes daily.  . Magnesium 250 MG TABS Take 1 tablet by mouth daily.  . metoprolol succinate (TOPROL XL) 25 MG 24 hr tablet Take 1 tablet (25 mg total) by mouth daily.  . Multiple Vitamin (MULTIVITAMIN WITH MINERALS) TABS tablet Take 1 tablet by mouth every morning.   . NON FORMULARY Take by mouth daily. Beet Root  . omeprazole (PRILOSEC) 20 MG capsule Take 1 capsule (20 mg total) by mouth daily.  . ondansetron (ZOFRAN) 8 MG tablet Take 1 tablet (8 mg total) by mouth every 8 (eight) hours as needed for nausea.  Marland Kitchen oxyCODONE (OXY IR/ROXICODONE) 5 MG immediate release tablet Take 1 tablet (5 mg total) by mouth every 4 (four) hours as needed for severe pain.  . pravastatin (PRAVACHOL) 20 MG tablet Take 1 tablet (20 mg total) by mouth daily.  . Probiotic Product (PROBIOTIC DAILY PO) Take 1 capsule by mouth daily.   . prochlorperazine (COMPAZINE) 10 MG tablet Take 1 tablet (10 mg total) by mouth every 6 (six) hours as needed for nausea or vomiting.  . terazosin (HYTRIN) 2 MG capsule Take 2 mg by mouth at bedtime.     Allergies:   Patient has no known allergies.   Social History   Tobacco Use  . Smoking status: Former Smoker    Packs/day: 1.50    Years: 25.00    Pack years: 37.50    Types: Cigarettes    Start date: 12/12/1962    Quit date: 01/11/1990    Years since quitting: 29.3  . Smokeless tobacco: Never Used  Substance Use Topics  . Alcohol use: No    Alcohol/week: 0.0 standard drinks  . Drug use: No     Family Hx: The patient's family history includes Alzheimer's disease in his brother and sister; Cancer in his brother, sister, and sister; Diabetes in his brother, sister, and son; Heart attack in his father and sister; Heart disease in his brother, brother, brother, brother,  brother, brother, brother, and father. There is no history of Anesthesia problems.  ROS:   Please see the history of present illness.     All other systems reviewed and are negative.   Prior CV studies:   The following studies were reviewed today:  Coronary CT angiography 04/27/2019:  1. Left Main:  No significant stenosis.  2. LAD: No significant stenosis.  FFR 0.9 proximal, 0.84 distal 3. LCX: No significant stenosis. FFR 0.9 in the mid vessel, 0.84 distally 4. RCA: No significant stenosis. FFR 0.9 proximally, 0.87 mid, 0.80 distally  IMPRESSION: 1.  CT FFR analysis didn't show any significant stenosis.  Labs/Other Tests and Data Reviewed:  EKG:  Event monitor demonstrated rapid atrial fibrillation with heart rates between 170-180 bpm.  Recent Labs: 04/10/2019: ALT 20; BUN 12; Creatinine, Ser 1.11; Hemoglobin 14.9; Platelets 154; Potassium 5.0; Sodium 138   Recent Lipid Panel Lab Results  Component Value Date/Time   CHOL 137 03/22/2019 07:59 AM   CHOL 144 05/30/2012 09:09 AM   TRIG 75 03/22/2019 07:59 AM   TRIG 140 05/20/2014 11:03 AM   TRIG 153 (H) 05/30/2012 09:09 AM   HDL 61 03/22/2019 07:59 AM   HDL 42 05/20/2014 11:03 AM   HDL 36 (L) 05/30/2012 09:09 AM   CHOLHDL 2.2 03/22/2019 07:59 AM   CHOLHDL 2.3 01/01/2013 09:27 AM   LDLCALC 61 03/22/2019 07:59 AM   LDLCALC 77 05/30/2012 09:09 AM    Wt Readings from Last 3 Encounters:  05/08/19 183 lb (83 kg)  04/10/19 189 lb 12.8 oz (86.1 kg)  04/05/19 181 lb (82.1 kg)     Objective:    Vital Signs:  BP 115/72   Pulse 75   Ht 6' (1.829 m)   Wt 183 lb (83 kg)   BMI 24.82 kg/m    VITAL SIGNS:  reviewed  ASSESSMENT & PLAN:    1.  Rapid atrial fibrillation: Event monitoring is demonstrated paroxysms of rapid atrial fibrillation with heart rates as high as 170-180 bpm.  I have recommended he begin Toprol-XL 25 mg daily rather than as needed.  I have also recommended he begin Eliquis 5 mg twice daily for  systemic anticoagulation.  Echocardiogram on 06/18/2013 demonstrated normal LV systolic function, LVEF 55 to 17%, grade 2 diastolic dysfunction, and mild left atrial dilatation.  I will obtain a follow-up echocardiogram to assess for interval changes in cardiac structure and function.  2.  Chest discomfort/CAD: Coronary CT angiography demonstrated nonobstructive three-vessel disease.  Continue aggressive risk factor modification.  I will switch pravastatin to rosuvastatin 20 mg daily.  I feel his symptoms are like related to moderate to large hiatal hernia.  3.  Hiatal hernia: He is currently on omeprazole.  He does have a moderate to large hiatal hernia which is likely causing chest discomfort.  4.  Hypercholesterolemia: Given nonobstructive coronary disease, I want to pursue aggressive risk factor modification.  I will switch pravastatin to rosuvastatin 20 mg daily.    COVID-19 Education: The signs and symptoms of COVID-19 were discussed with the patient and how to seek care for testing (follow up with PCP or arrange E-visit).  The importance of social distancing was discussed today.  Time:   Today, I have spent 40 minutes with the patient with telehealth technology discussing the above problems.     Medication Adjustments/Labs and Tests Ordered: Current medicines are reviewed at length with the patient today.  Concerns regarding medicines are outlined above.   Tests Ordered: No orders of the defined types were placed in this encounter.   Medication Changes: No orders of the defined types were placed in this encounter.   Follow Up:  In Person in 3 month(s)  Signed, Kate Sable, MD  05/08/2019 11:03 AM    [place]

## 2019-05-08 NOTE — Addendum Note (Signed)
Addended by: Laurine Blazer on: 05/08/2019 12:34 PM   Modules accepted: Orders

## 2019-05-08 NOTE — Telephone Encounter (Signed)
Patient called stating that with the issues he has been having is it ok for him to work in the garden, cut a tree down, etc.

## 2019-05-08 NOTE — Telephone Encounter (Signed)
He is having paroxysms of rapid atrial fibrillation.  Start Toprol-XL 25 mg daily rather than as needed.  He needs systemic anticoagulation.  Start apixaban 5 mg twice daily.

## 2019-05-08 NOTE — Telephone Encounter (Signed)
If he has started Toprol-XL I think it would be fine for him to do so.  Tell him to take precautions as he is now on a blood thinner.

## 2019-05-08 NOTE — Telephone Encounter (Signed)
Pt voiced understanding

## 2019-05-08 NOTE — Telephone Encounter (Signed)
Pt voiced understanding - will pick up Elqiuis at [facility] (30 day free voucher info sent electronically)  - already has Toprol XL and asked that we send [facility] day supply to [facility]

## 2019-05-08 NOTE — Patient Instructions (Addendum)
Medication Instructions:   Begin Toprol XL 25mg  daily.   Begin Eliquis 5mg  twice a day  Stop Pravastatin.  Begin Crestor 20mg  daily - sent to [facility] for 90 day supply.   Continue all other medications.    Labwork: none  Testing/Procedures:  Your physician has requested that you have an echocardiogram. Echocardiography is a painless test that uses sound waves to create images of your heart. It provides your doctor with information about the size and shape of your heart and how well your heart's chambers and valves are working. This procedure takes approximately one hour. There are no restrictions for this procedure.  Office will contact with results via phone or letter.    Follow-Up: 3 months   Any Other Special Instructions Will Be Listed Below (If Applicable).  If you need a refill on your cardiac medications before your next appointment, please call your pharmacy.

## 2019-05-09 ENCOUNTER — Encounter: Payer: Self-pay | Admitting: Hematology and Oncology

## 2019-05-09 DIAGNOSIS — Z23 Encounter for immunization: Secondary | ICD-10-CM | POA: Diagnosis not present

## 2019-05-09 DIAGNOSIS — L814 Other melanin hyperpigmentation: Secondary | ICD-10-CM | POA: Diagnosis not present

## 2019-05-09 DIAGNOSIS — D1801 Hemangioma of skin and subcutaneous tissue: Secondary | ICD-10-CM | POA: Diagnosis not present

## 2019-05-09 DIAGNOSIS — L570 Actinic keratosis: Secondary | ICD-10-CM | POA: Diagnosis not present

## 2019-05-09 DIAGNOSIS — L821 Other seborrheic keratosis: Secondary | ICD-10-CM | POA: Diagnosis not present

## 2019-05-09 DIAGNOSIS — D229 Melanocytic nevi, unspecified: Secondary | ICD-10-CM | POA: Diagnosis not present

## 2019-05-10 ENCOUNTER — Other Ambulatory Visit: Payer: Self-pay | Admitting: *Deleted

## 2019-05-10 MED ORDER — ROSUVASTATIN CALCIUM 20 MG PO TABS: 20.0000 mg | ORAL_TABLET | Freq: Every day | ORAL | 3 refills | Status: DC

## 2019-05-15 ENCOUNTER — Encounter: Payer: Self-pay | Admitting: Hematology and Oncology

## 2019-05-16 ENCOUNTER — Telehealth: Payer: Self-pay | Admitting: *Deleted

## 2019-05-16 ENCOUNTER — Encounter: Payer: Self-pay | Admitting: Hematology and Oncology

## 2019-05-16 DIAGNOSIS — I480 Paroxysmal atrial fibrillation: Secondary | ICD-10-CM

## 2019-05-16 DIAGNOSIS — I2583 Coronary atherosclerosis due to lipid rich plaque: Secondary | ICD-10-CM

## 2019-05-16 DIAGNOSIS — I2510 Atherosclerotic heart disease of native coronary artery without angina pectoris: Secondary | ICD-10-CM

## 2019-05-16 NOTE — Telephone Encounter (Signed)
Please check base metabolic panel and magnesium.

## 2019-05-16 NOTE — Telephone Encounter (Signed)
Anthony Kelly with [employer] calling with critical 6 sec of V tach at a rate of 208 BPM with underlying rhythm sinus bradycardia rate at 58 - spoke with pt who said he was sitting and felt palpitations and fluttering - denies any symptoms at this time - printed report and emailed to Dr Bronson Ing

## 2019-05-17 ENCOUNTER — Other Ambulatory Visit: Payer: Medicare Other

## 2019-05-17 ENCOUNTER — Inpatient Hospital Stay: Payer: Medicare Other | Attending: Hematology and Oncology

## 2019-05-17 ENCOUNTER — Other Ambulatory Visit: Payer: Self-pay | Admitting: *Deleted

## 2019-05-17 ENCOUNTER — Ambulatory Visit (HOSPITAL_COMMUNITY)
Admission: RE | Admit: 2019-05-17 | Discharge: 2019-05-17 | Disposition: A | Discharge status: Home / Self Care | Payer: Medicare Other | Source: Ambulatory Visit | Attending: Hematology and Oncology | Admitting: Hematology and Oncology

## 2019-05-17 ENCOUNTER — Other Ambulatory Visit: Payer: Self-pay

## 2019-05-17 DIAGNOSIS — M549 Dorsalgia, unspecified: Secondary | ICD-10-CM | POA: Diagnosis not present

## 2019-05-17 DIAGNOSIS — I4891 Unspecified atrial fibrillation: Secondary | ICD-10-CM

## 2019-05-17 DIAGNOSIS — Z9221 Personal history of antineoplastic chemotherapy: Secondary | ICD-10-CM | POA: Diagnosis not present

## 2019-05-17 DIAGNOSIS — G893 Neoplasm related pain (acute) (chronic): Secondary | ICD-10-CM | POA: Insufficient documentation

## 2019-05-17 DIAGNOSIS — I2510 Atherosclerotic heart disease of native coronary artery without angina pectoris: Secondary | ICD-10-CM | POA: Insufficient documentation

## 2019-05-17 DIAGNOSIS — Z7901 Long term (current) use of anticoagulants: Secondary | ICD-10-CM | POA: Diagnosis not present

## 2019-05-17 DIAGNOSIS — C8295 Follicular lymphoma, unspecified, lymph nodes of inguinal region and lower limb: Secondary | ICD-10-CM | POA: Insufficient documentation

## 2019-05-17 DIAGNOSIS — Z79899 Other long term (current) drug therapy: Secondary | ICD-10-CM | POA: Insufficient documentation

## 2019-05-17 DIAGNOSIS — R079 Chest pain, unspecified: Secondary | ICD-10-CM | POA: Insufficient documentation

## 2019-05-17 DIAGNOSIS — C8280 Other types of follicular lymphoma, unspecified site: Secondary | ICD-10-CM

## 2019-05-17 DIAGNOSIS — C8290 Follicular lymphoma, unspecified, unspecified site: Secondary | ICD-10-CM | POA: Diagnosis not present

## 2019-05-17 LAB — CBC WITH DIFFERENTIAL/PLATELET
Abs Immature Granulocytes: 0.02 K/uL (ref 0.00–0.07)
Basophils Absolute: 0.1 K/uL (ref 0.0–0.1)
Basophils Relative: 1 %
Eosinophils Absolute: 0.7 K/uL — ABNORMAL HIGH (ref 0.0–0.5)
Eosinophils Relative: 10 %
HCT: 47.5 % (ref 39.0–52.0)
Hemoglobin: 16.3 g/dL (ref 13.0–17.0)
Immature Granulocytes: 0 %
Lymphocytes Relative: 15 %
Lymphs Abs: 1.1 K/uL (ref 0.7–4.0)
MCH: 30.6 pg (ref 26.0–34.0)
MCHC: 34.3 g/dL (ref 30.0–36.0)
MCV: 89.3 fL (ref 80.0–100.0)
Monocytes Absolute: 0.7 K/uL (ref 0.1–1.0)
Monocytes Relative: 10 %
Neutro Abs: 4.7 K/uL (ref 1.7–7.7)
Neutrophils Relative %: 64 %
Platelets: 175 K/uL (ref 150–400)
RBC: 5.32 MIL/uL (ref 4.22–5.81)
RDW: 13.1 % (ref 11.5–15.5)
WBC: 7.3 K/uL (ref 4.0–10.5)
nRBC: 0 % (ref 0.0–0.2)

## 2019-05-17 LAB — COMPREHENSIVE METABOLIC PANEL WITH GFR
ALT: 17 U/L (ref 0–44)
AST: 19 U/L (ref 15–41)
Albumin: 3.9 g/dL (ref 3.5–5.0)
Alkaline Phosphatase: 59 U/L (ref 38–126)
Anion gap: 9 (ref 5–15)
BUN: 13 mg/dL (ref 8–23)
CO2: 27 mmol/L (ref 22–32)
Calcium: 9.2 mg/dL (ref 8.9–10.3)
Chloride: 103 mmol/L (ref 98–111)
Creatinine, Ser: 1.19 mg/dL (ref 0.61–1.24)
GFR calc Af Amer: >60 mL/min (ref >60)
GFR calc non Af Amer: 60 mL/min — ABNORMAL LOW (ref >60)
Glucose, Bld: 112 mg/dL — ABNORMAL HIGH (ref 70–99)
Potassium: 4.4 mmol/L (ref 3.5–5.1)
Sodium: 139 mmol/L (ref 135–145)
Total Bilirubin: 0.6 mg/dL (ref 0.3–1.2)
Total Protein: 6.8 g/dL (ref 6.5–8.1)

## 2019-05-17 IMAGING — CT CT ABD-PELV W/ CM
2 of 5 series · 12 of 36 positions shown, 15 images · IV contrast (APPLIED)
Comparison: [DATE]

CLINICAL DATA: Follicular low-grade B-cell lymphoma.  Restaging.

EXAM:
CT CHEST, ABDOMEN, AND PELVIS WITH CONTRAST
TECHNIQUE: Multidetector CT imaging of the chest, abdomen and pelvis was
performed following the standard protocol during bolus
administration of intravenous contrast.
CONTRAST:  100mL OMNIPAQUE IOHEXOL 300 MG/ML  SOLN

[Series 2: cap with · axial · 0.90mm/px · z∈[-548,+37]mm · 9 of 144 slices shown, 12 images]
[im 14/144  mediastinal]
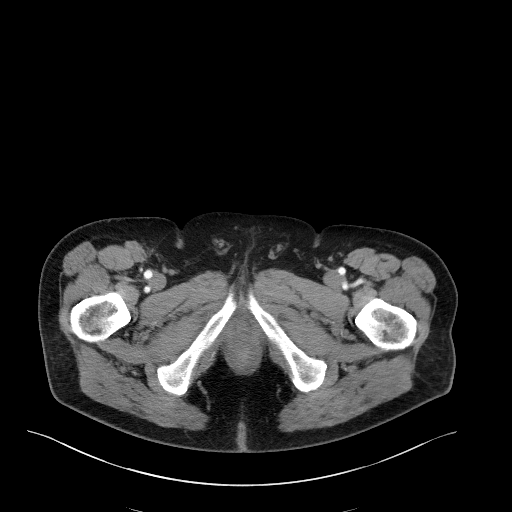
[im 14/144  lung]
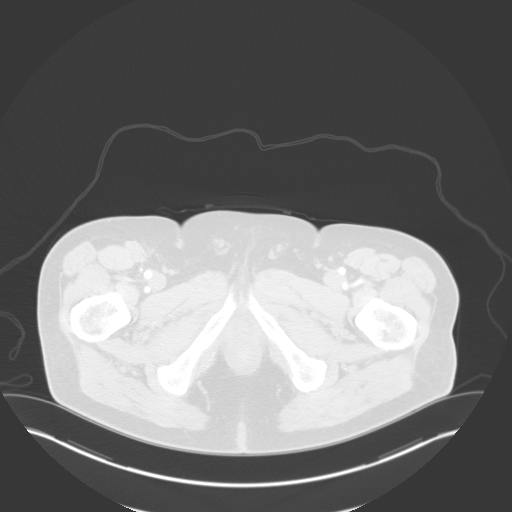
[im 27/144  lung]
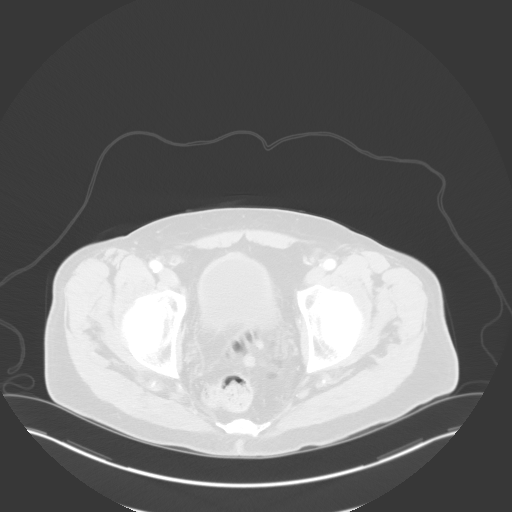
[im 40/144  lung]
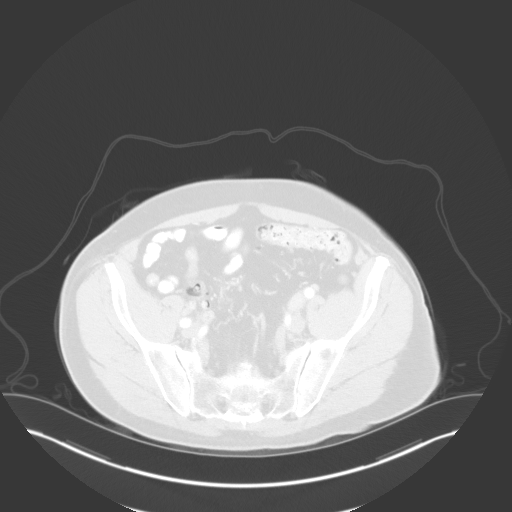
[im 53/144  lung]
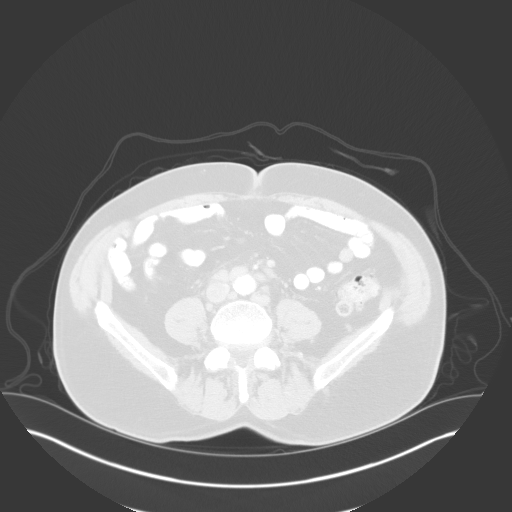
[im 79/144  mediastinal]
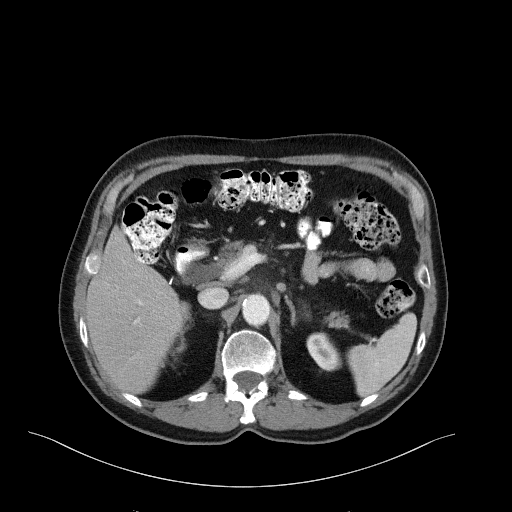
[im 79/144  lung]
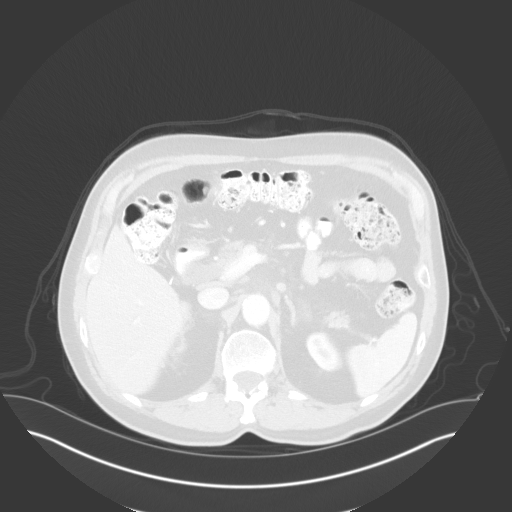
[im 92/144  lung]
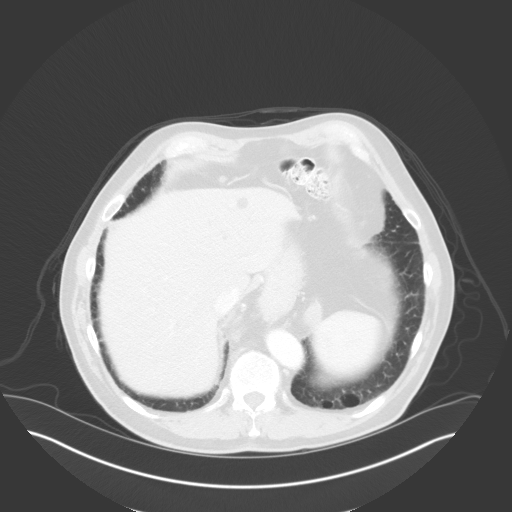
[im 105/144  lung]
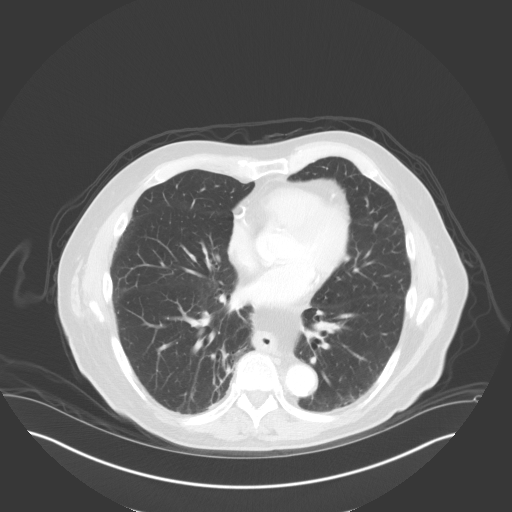
[im 118/144  lung]
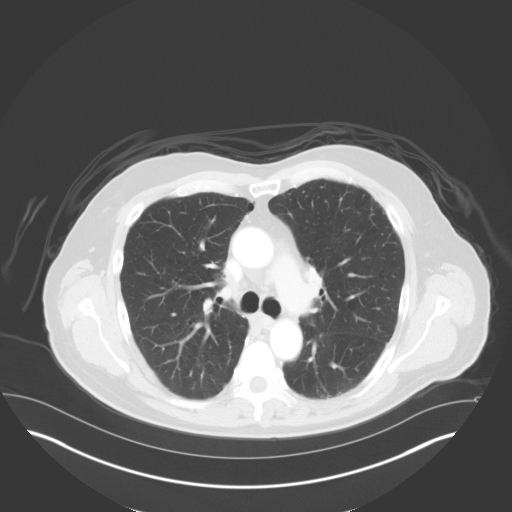
[im 131/144  mediastinal]
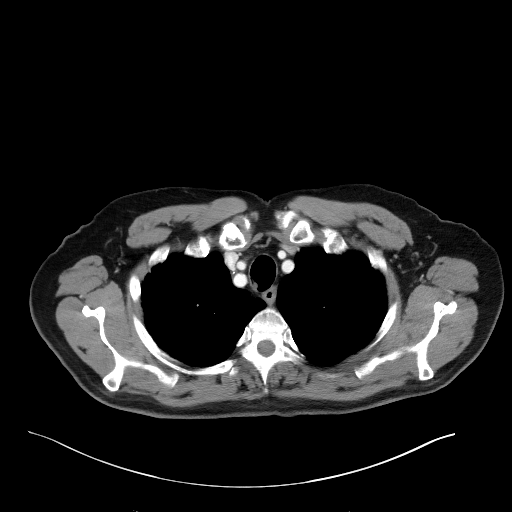
[im 131/144  lung]
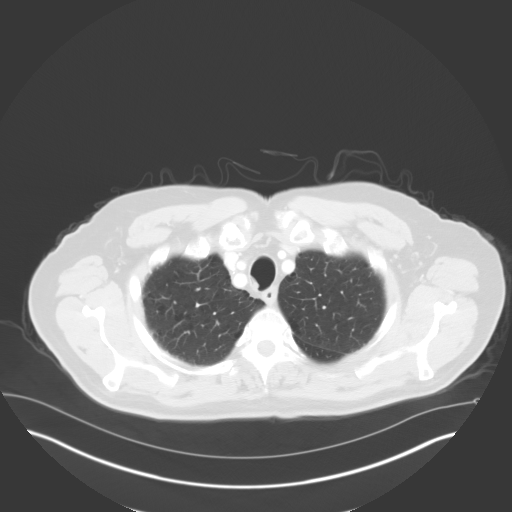

[Series 4: coronals · coronal · 0.75mm/px · 3 of 160 slices shown]
[im 32/160  lung]
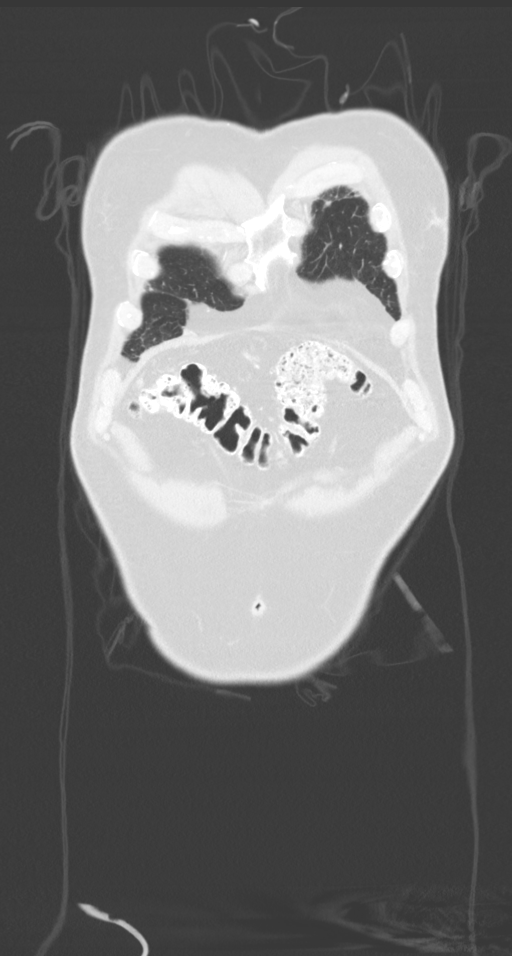
[im 64/160  lung]
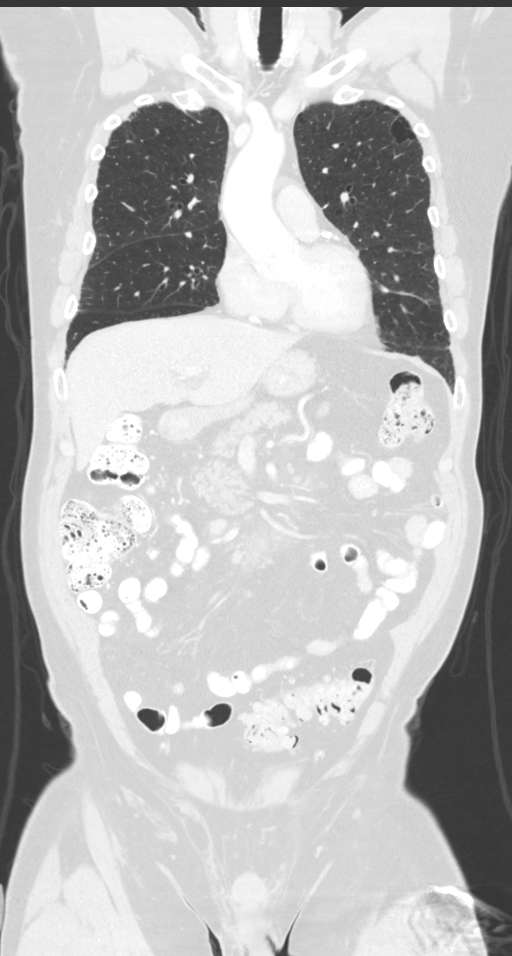
[im 96/160  lung]
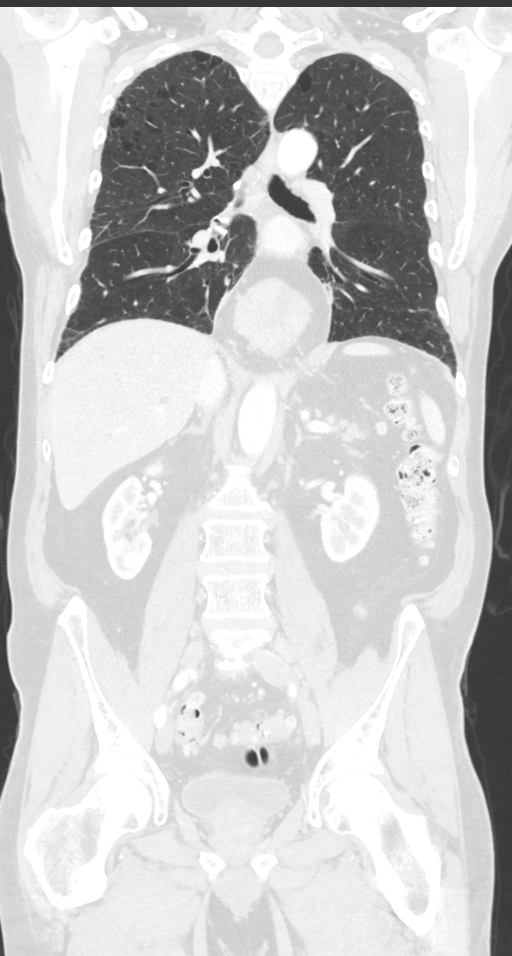

[12 of 36 positions shown; findings below may reference images not displayed]

FINDINGS: CT CHEST FINDINGS

Cardiovascular: The heart size is normal. No substantial pericardial
effusion. Coronary artery calcification is evident. Atherosclerotic
calcification is noted in the wall of the thoracic aorta.

Mediastinum/Nodes: No mediastinal lymphadenopathy. There is no hilar
lymphadenopathy. Moderate hiatal hernia. There is no axillary
lymphadenopathy.

Lungs/Pleura: Centrilobular and paraseptal emphysema evident. No
suspicious pulmonary nodule or mass.

Musculoskeletal: No worrisome lytic or sclerotic osseous
abnormality.

CT ABDOMEN PELVIS FINDINGS

Hepatobiliary: Small left hepatic cyst is stable. Intrahepatic and
extrahepatic biliary duct dilatation is similar to prior with
gradual tapering into the common bile duct at the head of pancreas
measuring 9 mm, similar to prior.

Pancreas: No focal mass lesion. No dilatation of the main duct. No
intraparenchymal cyst. No peripancreatic edema.

Spleen: No splenomegaly. No focal mass lesion.

Adrenals/Urinary Tract: No adrenal nodule or mass. Right kidney
unremarkable. Inferior pole left renal cyst is stable. No evidence
for hydroureter. The urinary bladder appears normal for the degree
of distention.

Stomach/Bowel: Moderate hiatal hernia. Duodenum is normally
positioned as is the ligament of Treitz. No small bowel wall
thickening. No small bowel dilatation. The terminal ileum is normal.
The appendix is not visualized, but there is no edema or
inflammation in the region of the cecum. No gross colonic mass. No
colonic wall thickening. Diverticular changes are noted in the left
colon without evidence of diverticulitis.

Vascular/Lymphatic: There is abdominal aortic atherosclerosis
without aneurysm. Interval decrease in retroperitoneal
lymphadenopathy. Measurements on the previous study did not persist
and index nodes are being remeasured on that prior exam today.

8 mm short axis left para-aortic node on image 72/series 2 was 11 mm
when I remeasure in a similar fashion on the prior study. A low
right pericaval node on 92/2 is 8 mm today compared to 13 mm
previously.

Immediately adjacent precaval node measuring 10 mm short axis today
was 15 mm short axis previously.

Similar interval decrease in pelvic sidewall lymphadenopathy with
index left external iliac node measuring 12 mm on image 105/series 2
today decreased from 19 mm previously.

No new or progressive lymphadenopathy on today's study.

Reproductive: Prostate gland is enlarged.

Other: No intraperitoneal free fluid.

Musculoskeletal: Small sclerotic focus in the left iliac bone is
stable, likely bone island. No worrisome lytic or sclerotic osseous
abnormality.
IMPRESSION: 1. Interval generalized decrease in abdominopelvic lymphadenopathy
identified on the previous study. No new or progressive
lymphadenopathy on today's study.
2. Moderate hiatal hernia.
3.  Emphysema ([MR]-[MR]) and Aortic Atherosclerosis ([MR]-170.0)

## 2019-05-17 MED ORDER — SODIUM CHLORIDE (PF) 0.9 % IJ SOLN
INTRAMUSCULAR | Status: AC
  Filled 2019-05-17: qty 50

## 2019-05-17 MED ORDER — IOHEXOL 300 MG/ML  SOLN
100.0000 mL | Freq: Once | INTRAMUSCULAR | Status: AC | PRN
  Administered 2019-05-17: 100 mL via INTRAVENOUS

## 2019-05-17 NOTE — Telephone Encounter (Signed)
Pt aware and is gong to cancer appt at [facility] - orders placed and pt will ask that they draw labs there

## 2019-05-18 ENCOUNTER — Telehealth: Payer: Self-pay | Admitting: Cardiovascular Disease

## 2019-05-18 ENCOUNTER — Telehealth: Payer: Self-pay | Admitting: Hematology and Oncology

## 2019-05-18 ENCOUNTER — Encounter: Payer: Self-pay | Admitting: Hematology and Oncology

## 2019-05-18 ENCOUNTER — Inpatient Hospital Stay (HOSPITAL_BASED_OUTPATIENT_CLINIC_OR_DEPARTMENT_OTHER): Payer: Medicare Other | Admitting: Hematology and Oncology

## 2019-05-18 ENCOUNTER — Telehealth: Payer: Self-pay

## 2019-05-18 DIAGNOSIS — C8280 Other types of follicular lymphoma, unspecified site: Secondary | ICD-10-CM | POA: Diagnosis not present

## 2019-05-18 DIAGNOSIS — R110 Nausea: Secondary | ICD-10-CM | POA: Diagnosis not present

## 2019-05-18 DIAGNOSIS — I2510 Atherosclerotic heart disease of native coronary artery without angina pectoris: Secondary | ICD-10-CM | POA: Diagnosis not present

## 2019-05-18 DIAGNOSIS — G893 Neoplasm related pain (acute) (chronic): Secondary | ICD-10-CM | POA: Diagnosis not present

## 2019-05-18 NOTE — Telephone Encounter (Signed)
OK 

## 2019-05-18 NOTE — Telephone Encounter (Signed)
That is entirely up to him.

## 2019-05-18 NOTE — Telephone Encounter (Signed)
Pt is aware of appts on 8/6. Per 5/7 sch msg.

## 2019-05-18 NOTE — Assessment & Plan Note (Signed)
He has significant moderate hiatal hernia on CT imaging His nausea could also be due to his treatment Recommend him to continue taking antiemetics as needed

## 2019-05-18 NOTE — Telephone Encounter (Signed)
Called and spoke with office staff at [facility]. Given below message. Office will call patient for appt early next week.

## 2019-05-18 NOTE — Assessment & Plan Note (Signed)
His pain control is good He will continue to take oxycodone as needed

## 2019-05-18 NOTE — Assessment & Plan Note (Signed)
The patient continued chest discomfort I have reviewed recent results of CT coronary scan results with the patient and his wife He is undergoing further work-up He also had recent intermittent SVT His wife requested second opinion with a cardiologist in [place] which I think is reasonable I will set up referral for second opinion

## 2019-05-18 NOTE — Telephone Encounter (Signed)
-----   Message from [name], MD sent at 05/18/2019 10:54 AM EDT ----- Regarding: he requested urgent second opinion with cardiologist. I sent a referral. please also call.

## 2019-05-18 NOTE — Assessment & Plan Note (Signed)
Overall, I have reviewed multiple imaging studies with the patient and his wife which showed positive response to therapy He tolerated chemotherapy very well except from occasional nausea We will continue treatment indefinitely I will see him again in 3 months for further follow-up

## 2019-05-18 NOTE — Progress Notes (Signed)
HEMATOLOGY-ONCOLOGY ELECTRONIC VISIT PROGRESS NOTE  Patient Care Team: Dettinger, [name], MD as PCP - General (Family Medicine) Anthony Commons, MD as PCP - Cardiology (Cardiology) Anthony Mayer, MD as Consulting Physician (Gastroenterology) Anthony Commons, MD as Consulting Physician (Cardiology) Anthony Lark, MD as Consulting Physician (Hematology and Oncology) Anthony Brownie, MD as Consulting Physician (Dermatology)  I connected with by [facility] video conference and verified that I am speaking with the correct person using two identifiers.  I discussed the limitations, risks, security and privacy concerns of performing an evaluation and management service by [facility] and the availability of in person appointments.  I also discussed with the patient that there may be a patient responsible charge related to this service. The patient expressed understanding and agreed to proceed.   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HCC) Overall, I have reviewed multiple imaging studies with the patient and his wife which showed positive response to therapy He tolerated chemotherapy very well except from occasional nausea We will continue treatment indefinitely I will see him again in 3 months for further follow-up    Cancer associated pain His pain control is good He will continue to take oxycodone as needed  Coronary artery calcification seen on CAT scan The patient continued chest discomfort I have reviewed recent results of CT coronary scan results with the patient and his wife He is undergoing further work-up He also had recent intermittent SVT His wife requested second opinion with a cardiologist in [place] which I think is reasonable I will set up referral for second opinion  Nausea without vomiting He has significant moderate hiatal hernia on CT imaging His nausea could also be due to his treatment Recommend him to continue taking antiemetics as  needed   Orders Placed This Encounter  Procedures  . Ambulatory referral to Cardiology    Referral Priority:   Urgent    Referral Type:   Consultation    Referral Reason:   Specialty Services Required    Requested Specialty:   Cardiology    Number of Visits Requested:   1    INTERVAL HISTORY: Please see below for problem oriented charting. The purpose of the visit is to discuss results of imaging study he tolerated chemotherapy fairly well except for occasional nausea He continues to have nonspecific intermittent chest pain and lower back pain He takes oxycodone as needed Recently, while working in his garden, he received a phone call saying that he developed significant tachycardia with his heart rate going as high as 160 He denies shortness of breath on exertion No recent diaphoresis or orthopnea His wife have a lot of questions related to recent cardiology work-up No recent infection, fever or chills  SUMMARY OF ONCOLOGIC HISTORY: Oncology History Overview Note  Low grade B-cell lymphoma   Primary site: Lymphoid Neoplasms   Staging method: AJCC 6th Edition   Clinical: Stage IV signed by Anthony Lark, MD on 09/26/2013  9:15 AM   Summary: Stage IV      Follicular low grade B-cell lymphoma ([place])  12/10/2003 Surgery   Inguinal lymph node biopsy showed follicular lymphoma.   12/12/2003 - 06/01/2004 Chemotherapy   He was treated with R. CHOP chemotherapy which show complete remission. The number of cycles of R. CHOP chemotherapy was unknown.   12/19/2006 Surgery   Lung resection show follicular lymphoma.   01/02/2007 - 09/01/2008 Chemotherapy   The patient was treated with single agent rituximab alone.   01/19/2007 Bone Marrow Biopsy  Bone marrow biopsy was negative.   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   05/08/2013 - 05/11/2013 Hospital Admission   The patient was admitted to the hospital for management of pericarditis. CT scan  showed extensive lymphadenopathy.   06/07/2013 Imaging   PET/CT scan showed extensive lymphadenopathy   06/25/2013 Procedure   He has placement of Infuse-a-Port.   06/28/2013 Bone Marrow Biopsy   Bone marrow biopsy is positive for lymphoma involvement.   07/04/2013 - 11/22/2013 Chemotherapy   He is treated with 6 cycles of bendamustine with rituximab.   09/24/2013 Imaging   Repeat PET scan show complete remission.   12/26/2013 Imaging   PEt scan showed complete remission   09/26/2014 Imaging   CT scan of the chest abdomen and pelvis show no evidence of disease   03/26/2015 Imaging   CT scan showed no evidence of lymphoma   04/14/2016 Imaging   CT: Borderline prominent right hilar and subcarinal lymph nodes, but not appreciably changed. 2. Low-grade but increased central mesenteric stranding with some small mesenteric lymph nodes. This could certainly be inflammatory, 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, [name], volume estimated at 75 cubic cm.   02/15/2017 Imaging   No evidence of recurrent lymphoma or other acute findings.  Stable moderate hiatal hernia.  Colonic diverticulosis, without radiographic evidence of diverticulitis.  Stable mildly enlarged prostate.  Mild emphysema.  Aortic and coronary artery atherosclerosis.   01/31/2019 Imaging   1. Interval development of abdominal and pelvic adenopathy compatible with recurrent lymphoma. 2. Emphysema and aortic atherosclerosis. 3. Multi  vessel coronary artery calcifications. 4. Moderate to large hiatal hernia   02/09/2019 Procedure   Successful CT-guided core biopsy of the left retroperitoneal periaortic adenopathy   02/09/2019 Pathology Results   SURGICAL PATHOLOGY  CASE: [id number]  PATIENT: Anthony Kelly  Surgical Pathology Report   Clinical History: Lymphoma; [name], LEFT PARA AORTIC, [name], cleaved nuclei and scant cytoplasm.  By immunohistochemistry, [name], [name], [name], [name] is low (less than 10%).  Flow cytometry was attempted; however, there was insufficient material for analysis (see [id number]).  Overall, the features are consistent with relapse of the patient's previously diagnosed follicular lymphoma.  Based on the biopsy, this is favored to be a low-grade follicular lymphoma   02/15/537 -  Chemotherapy   The patient had idelisib for chemotherapy treatment.     05/17/2019 Imaging   1. Interval generalized decrease in abdominopelvic lymphadenopathy identified on the previous study. No new or progressive lymphadenopathy on today's study. 2. Moderate hiatal hernia. 3.  Emphysema (ICD10-J43.9) and Aortic Atherosclerosis (ICD10-170.0)     REVIEW OF SYSTEMS:   Constitutional: Denies fevers, chills or abnormal weight loss Eyes: Denies blurriness of vision Ears, nose, mouth, throat, and face: Denies mucositis or sore throat Respiratory: Denies cough, dyspnea or  wheezes Skin: Denies abnormal skin rashes Lymphatics: Denies new lymphadenopathy or easy bruising Neurological:Denies numbness, tingling or new weaknesses Behavioral/Psych: Mood is stable, no new changes  Extremities: No lower extremity edema All other systems were reviewed with the patient and are negative.  I have reviewed the past medical history, past surgical history, social history and family history with the patient and they are unchanged from previous note.  ALLERGIES:  has No Known Allergies.  MEDICATIONS:  Current Outpatient Medications  Medication Sig Dispense Refill  . acyclovir (ZOVIRAX) 400 MG tablet Take 1 tablet (400 mg total) by mouth 2 (two) times daily. 180 tablet 6  . apixaban (ELIQUIS) 5 MG TABS tablet Take 1 tablet (5 mg total) by mouth 2 (two) times daily. (Patient not taking: Reported on 05/08/2019) 60 tablet 1  . brimonidine (ALPHAGAN) 0.2 % ophthalmic solution Place 1 drop into both eyes 2 (two) times daily.     . idelalisib (ZYDELIG) 150 MG tablet Take 1 tablet (150 mg total) by mouth 2 (two) times daily. 60 tablet 11  . LUMIGAN 0.01 % SOLN Place 1 drop into both eyes daily.    . Magnesium 250 MG TABS Take 1 tablet by mouth daily.    . metoprolol succinate (TOPROL XL) 25 MG 24 hr tablet Take 1 tablet (25 mg total) by mouth daily. 90 tablet 1  . Multiple Vitamin (MULTIVITAMIN WITH MINERALS) TABS tablet Take 1 tablet by mouth every morning.     . NON FORMULARY Take by mouth daily. Beet Root    . omeprazole (PRILOSEC) 20 MG capsule Take 1 capsule (20 mg total) by mouth daily. 90 capsule 3  . ondansetron (ZOFRAN) 8 MG tablet Take 1 tablet (8 mg total) by mouth every 8 (eight) hours as needed for nausea. 30 tablet 3  . oxyCODONE (OXY IR/ROXICODONE) 5 MG immediate release tablet Take 1 tablet (5 mg total) by mouth every 4 (four) hours as needed for severe pain. 60 tablet 0  . Probiotic Product (PROBIOTIC DAILY PO) Take 1 capsule by mouth daily.     . prochlorperazine  (COMPAZINE) 10 MG tablet Take 1 tablet (10 mg total) by mouth every 6 (six) hours as needed for nausea or vomiting. 30 tablet 0  . rosuvastatin (CRESTOR) 20 MG tablet Take 1 tablet (20 mg total) by mouth daily. 90 tablet 3  . terazosin (HYTRIN) 2 MG capsule Take 2 mg by mouth at bedtime.     No current facility-administered medications for this visit.    PHYSICAL EXAMINATION: ECOG PERFORMANCE STATUS: 1 - Symptomatic but completely ambulatory  LABORATORY DATA:  I have reviewed the data as listed CMP Latest Ref Rng & Units 05/17/2019 04/10/2019 03/28/2019  Glucose 70 - 99 mg/dL 112(H) 116(H) 122(H)  BUN 8 - 23 mg/dL '13 12 18  ' Creatinine 0.61 - 1.24 mg/dL 1.19 1.11 1.22  Sodium 135 - 145 mmol/L 139 138 132(L)  Potassium 3.5 - 5.1 mmol/L 4.4 5.0 4.3  Chloride 98 - 111 mmol/L 103 105 97(L)  CO2 22 - 32 mmol/L '27 24 27  ' Calcium 8.9 - 10.3 mg/dL 9.2 8.9 8.8(L)  Total Protein 6.5 - 8.1 g/dL 6.8 6.6 -  Total Bilirubin 0.3 - 1.2 mg/dL 0.6 0.5 -  Alkaline Phos 38 - 126 U/L 59 63 -  AST 15 - 41 U/L 19 18 -  ALT 0 - 44 U/L 17 20 -    Lab Results  Component Value Date   WBC 7.3 05/17/2019   HGB 16.3 05/17/2019   HCT 47.5 05/17/2019   MCV 89.3 05/17/2019   PLT 175 05/17/2019   NEUTROABS 4.7 05/17/2019     RADIOGRAPHIC STUDIES: I have reviewed multiple CT imaging with the patient and his wife I have personally reviewed the radiological images as listed and agreed with the findings in the report. CT CHEST W CONTRAST  Result Date: 05/17/2019 CLINICAL DATA:  Follicular low-grade B-cell lymphoma.  Restaging. EXAM: CT CHEST, ABDOMEN, AND PELVIS WITH CONTRAST TECHNIQUE: Multidetector CT imaging of the chest, abdomen and pelvis was performed following the standard protocol during bolus administration of intravenous contrast. CONTRAST:  158m OMNIPAQUE IOHEXOL 300 MG/ML  SOLN COMPARISON:  01/31/2019 FINDINGS: CT CHEST FINDINGS Cardiovascular: The heart size is normal. No substantial pericardial  effusion. Coronary artery calcification is evident. Atherosclerotic calcification is noted in the wall of the thoracic aorta. Mediastinum/Nodes: No mediastinal lymphadenopathy. There is no hilar lymphadenopathy. Moderate hiatal hernia. There is no axillary lymphadenopathy. Lungs/Pleura: Centrilobular and paraseptal emphysema evident. No suspicious pulmonary nodule or mass. Musculoskeletal: No worrisome lytic or sclerotic osseous abnormality. CT ABDOMEN PELVIS FINDINGS Hepatobiliary: Small left hepatic cyst is stable. Intrahepatic and extrahepatic biliary duct dilatation is similar to prior with gradual tapering into the common bile duct at the head of pancreas measuring 9 mm, similar to prior. Pancreas: No focal mass lesion. No dilatation of the main duct. No intraparenchymal cyst. No peripancreatic edema. Spleen: No splenomegaly. No focal mass lesion. Adrenals/Urinary Tract: No adrenal nodule or mass. Right kidney unremarkable. Inferior pole left renal cyst is stable. No evidence for hydroureter. The urinary bladder appears normal for the degree of distention. Stomach/Bowel: Moderate hiatal hernia. Duodenum is normally positioned as is the ligament of Treitz. No small bowel wall thickening. No small bowel dilatation. The terminal ileum is normal. The appendix is not visualized, but there is no edema or inflammation in the region of the cecum. No gross colonic mass. No colonic wall thickening. Diverticular changes are noted in the left colon without evidence of diverticulitis. Vascular/Lymphatic: There is abdominal aortic atherosclerosis without aneurysm. Interval decrease in retroperitoneal lymphadenopathy. Measurements on the previous study did not persist and index nodes are being remeasured on that prior exam today. 8 mm short axis left para-aortic node on image 72/series 2 was 11 mm when I remeasure in a similar fashion on the prior study. A low right pericaval node on 92/2 is 8 mm today compared to 13 mm  previously. Immediately adjacent precaval node measuring 10 mm short axis today was 15 mm short axis previously. Similar interval decrease in pelvic sidewall lymphadenopathy with index left external iliac node measuring 12 mm on image 105/series 2 today decreased from 19 mm previously. No new or progressive lymphadenopathy on today's study. Reproductive: Prostate gland is enlarged. Other: No intraperitoneal free fluid. Musculoskeletal: Small sclerotic focus in the left iliac bone is stable, likely bone island. No worrisome lytic or sclerotic osseous abnormality. IMPRESSION: 1. Interval generalized decrease in abdominopelvic lymphadenopathy identified on the previous study. No new or progressive lymphadenopathy on today's study. 2. Moderate hiatal hernia. 3.  Emphysema (ICD10-J43.9) and Aortic Atherosclerosis (ICD10-170.0) Electronically Signed   By: EMisty StanleyM.D.   On: 05/17/2019 11:18   CT ABDOMEN PELVIS W CONTRAST  Result  Date: 05/17/2019 CLINICAL DATA:  Follicular low-grade B-cell lymphoma.  Restaging. EXAM: CT CHEST, ABDOMEN, AND PELVIS WITH CONTRAST TECHNIQUE: Multidetector CT imaging of the chest, abdomen and pelvis was performed following the standard protocol during bolus administration of intravenous contrast. CONTRAST:  144m OMNIPAQUE IOHEXOL 300 MG/ML  SOLN COMPARISON:  01/31/2019 FINDINGS: CT CHEST FINDINGS Cardiovascular: The heart size is normal. No substantial pericardial effusion. Coronary artery calcification is evident. Atherosclerotic calcification is noted in the wall of the thoracic aorta. Mediastinum/Nodes: No mediastinal lymphadenopathy. There is no hilar lymphadenopathy. Moderate hiatal hernia. There is no axillary lymphadenopathy. Lungs/Pleura: Centrilobular and paraseptal emphysema evident. No suspicious pulmonary nodule or mass. Musculoskeletal: No worrisome lytic or sclerotic osseous abnormality. CT ABDOMEN PELVIS FINDINGS Hepatobiliary: Small left hepatic cyst is stable.  Intrahepatic and extrahepatic biliary duct dilatation is similar to prior with gradual tapering into the common bile duct at the head of pancreas measuring 9 mm, similar to prior. Pancreas: No focal mass lesion. No dilatation of the main duct. No intraparenchymal cyst. No peripancreatic edema. Spleen: No splenomegaly. No focal mass lesion. Adrenals/Urinary Tract: No adrenal nodule or mass. Right kidney unremarkable. Inferior pole left renal cyst is stable. No evidence for hydroureter. The urinary bladder appears normal for the degree of distention. Stomach/Bowel: Moderate hiatal hernia. Duodenum is normally positioned as is the ligament of Treitz. No small bowel wall thickening. No small bowel dilatation. The terminal ileum is normal. The appendix is not visualized, but there is no edema or inflammation in the region of the cecum. No gross colonic mass. No colonic wall thickening. Diverticular changes are noted in the left colon without evidence of diverticulitis. Vascular/Lymphatic: There is abdominal aortic atherosclerosis without aneurysm. Interval decrease in retroperitoneal lymphadenopathy. Measurements on the previous study did not persist and index nodes are being remeasured on that prior exam today. 8 mm short axis left para-aortic node on image 72/series 2 was 11 mm when I remeasure in a similar fashion on the prior study. A low right pericaval node on 92/2 is 8 mm today compared to 13 mm previously. Immediately adjacent precaval node measuring 10 mm short axis today was 15 mm short axis previously. Similar interval decrease in pelvic sidewall lymphadenopathy with index left external iliac node measuring 12 mm on image 105/series 2 today decreased from 19 mm previously. No new or progressive lymphadenopathy on today's study. Reproductive: Prostate gland is enlarged. Other: No intraperitoneal free fluid. Musculoskeletal: Small sclerotic focus in the left iliac bone is stable, likely bone island. No worrisome  lytic or sclerotic osseous abnormality. IMPRESSION: 1. Interval generalized decrease in abdominopelvic lymphadenopathy identified on the previous study. No new or progressive lymphadenopathy on today's study. 2. Moderate hiatal hernia. 3.  Emphysema (ICD10-J43.9) and Aortic Atherosclerosis (ICD10-170.0) Electronically Signed   By: EMisty StanleyM.D.   On: 05/17/2019 11:18   CT CORONARY MORPH W/CTA COR W/SCORE W/CA W/CM &/OR WO/CM  Addendum Date: 04/26/2019   ADDENDUM REPORT: 04/26/2019 16:27 ADDENDUM: OVER-READ INTERPRETATION  CT CHEST The following report is an over-read performed by radiologist Dr. KForest GleasonGBacharach Institute [facility] RehabilitationRadiology, PSteeleon 04/26/2019. This over-read does not include interpretation of cardiac or coronary anatomy or pathology. The coronary CTA interpretation by the cardiologist is attached. COMPARISON: 01/31/2019 diagnostic chest CT. FINDINGS: No pleural fluid. Surgical sutures in the right lower lobe. Centrilobular emphysema. Aortic atherosclerosis. No imaged thoracic adenopathy. Moderate hiata hernia. High left hepatic lobe cyst. Normal imaged portions of the spleen, stomach. No acute osseous abnormality. IMPRESSION: 1.  No acute findings  in the imaged extracardiac chest. 2.  Aortic Atherosclerosis (ICD10-I70.0). 3. Moderate hiatal hernia. Electronically Signed   By: Abigail Miyamoto M.D.   On: 04/26/2019 16:27   Result Date: 04/26/2019 CLINICAL DATA:  Hx of chestpain. Hx of coronary calcifications on chest ct. EXAM: Cardiac/Coronary  CTA TECHNIQUE: The patient was scanned on a Siemens Somatoform go.Top scanner. FINDINGS: A retrospective scan was triggered in the descending thoracic aorta. Axial non-contrast 3 mm slices were carried out through the heart. The data set was analyzed on a dedicated work station and scored using the [place]. Gantry rotation speed was 330 msecs and collimation was .6 mm. 182m of metoprolol and 0.8 mg of sl NTG was given. The 3D data set was reconstructed in  5% intervals of the 50-95 % of the R-R cycle. Diastolic phases were analyzed on a dedicated work station using MPR, MIP and VRT modes. The patient received 75 cc of contrast. Aorta: Normal size. Mild ascending aorta calcifications. No dissection. Aortic Valve:  Trileaflet.  No calcifications. Coronary Arteries:  Normal coronary origin.  Right dominance. RCA is a large dominant artery that gives rise to PDA and PLA. There is calcified plaque in the mid vessel causing severe stenosis (70-99%). Calcified plaque in the distal RCA causing mid stenosis (25-49%). Left main is a large artery that gives rise to LAD and LCX arteries. LAD is a large vessel that has diffuse calcified plaque in the proximal to mid segments causing mild stenosis (25-49%) proximally and severe stenosis in the mid segment (70-99%). LCX is a non-dominant artery that gives rise to one large OM1 branch. There is calcified plaque in the mid LCx causing mild stenosis (25-49%). Other findings: Normal pulmonary vein drainage into the left atrium. Normal left atrial appendage without a thrombus. Normal size of the pulmonary artery. IMPRESSION: 1. Coronary calcium score of 1649. This was 88th percentile for age and sex matched control. 2. Normal coronary origin with right dominance. 3.  Heavily calcified plaque in the mid LAD causing severe stenosis 4.  Heavily calcified plaque in the mid RCA causing severe stenosis. 5. CAD-RADS 4 Severe stenosis. (70-99% or > 50% left main). Cardiac catheterization or CT FFR is recommended. Consider symptom-guided anti-ischemic pharmacotherapy as well as risk factor modification per guideline directed care. Additional analysis with CT FFR will be submitted and reported separately. Electronically Signed: By: BKate SableM.D. On: 04/26/2019 14:49   CT CORONARY FRACTIONAL FLOW RESERVE DATA PREP  Result Date: 04/27/2019 EXAM: CT FFR ANALYSIS CLINICAL DATA:  Hx of chestpain. CCTA with elevated calcium score of 1649.  FINDINGS: FFRct analysis was performed on the original cardiac CT angiogram dataset. Diagrammatic representation of the FFRct analysis is provided in a separate PDF document in PACS. This dictation was created using the PDF document and an interactive 3D model of the results. 3D model is not available in the EMR/PACS. Normal FFR range is >0.80. 1. Left Main:  No significant stenosis. 2. LAD: No significant stenosis.  FFR 0.9 proximal, 0.84 distal 3. LCX: No significant stenosis. FFR 0.9 in the mid vessel, 0.84 distally 4. RCA: No significant stenosis. FFR 0.9 proximally, 0.87 mid, 0.80 distally IMPRESSION: 1.  CT FFR analysis didn't show any significant stenosis. Electronically Signed   By: BKate SableM.D.   On: 04/27/2019 13:09    I discussed the assessment and treatment plan with the patient. The patient was provided an opportunity to ask questions and all were answered. The patient agreed with the plan  and demonstrated an understanding of the instructions. The patient was advised to call back or seek an in-person evaluation if the symptoms worsen or if the condition fails to improve as anticipated.    I spent 30 minutes for the appointment reviewing test results, discuss management and coordination of care.  Anthony Lark, MD 05/18/2019 10:52 AM

## 2019-05-18 NOTE — Telephone Encounter (Signed)
Patient would like a 2nd opinion are you both in agreement? Please advise.

## 2019-05-21 ENCOUNTER — Encounter: Payer: Self-pay | Admitting: Hematology and Oncology

## 2019-05-22 ENCOUNTER — Other Ambulatory Visit: Payer: Self-pay

## 2019-05-22 ENCOUNTER — Telehealth: Payer: Self-pay

## 2019-05-22 ENCOUNTER — Telehealth: Payer: Self-pay | Admitting: Cardiovascular Disease

## 2019-05-22 ENCOUNTER — Ambulatory Visit (INDEPENDENT_AMBULATORY_CARE_PROVIDER_SITE_OTHER): Payer: Medicare Other

## 2019-05-22 DIAGNOSIS — I4891 Unspecified atrial fibrillation: Secondary | ICD-10-CM | POA: Diagnosis not present

## 2019-05-22 NOTE — Telephone Encounter (Signed)
Wife called to check on referral for 2nd opinion to [facility]. Given [facility] phone number. [name] is complaining of headache. Instructed to take tylenol every 6 hours as needed. He thinks it may be from allergies and will try OTC Claritin. Instructed to call the office back if needed.

## 2019-05-22 NOTE — Telephone Encounter (Signed)
Asking for lab results

## 2019-05-22 NOTE — Telephone Encounter (Signed)
ECHO -  [name], MD  05/22/2019 2:39 PM EDT    Normal pumping function    LABS - magnesium level   [name], MD  05/22/2019 2:38 PM EDT    Normal

## 2019-05-22 NOTE — Telephone Encounter (Signed)
Anthony Kelly, [place]  8/34/6219 4:71 PM EDT    Patient notified. Copy to pcp.

## 2019-05-24 ENCOUNTER — Encounter: Payer: Self-pay | Admitting: Hematology and Oncology

## 2019-05-25 ENCOUNTER — Telehealth: Payer: Self-pay

## 2019-05-25 NOTE — Telephone Encounter (Signed)
[facility]ed [facility] and scheduled appt with Dr. Percival Spanish on 5/27 at 1100 am for second opinion. Called and given appt time to wife. She verbalized understanding.

## 2019-05-28 ENCOUNTER — Telehealth: Payer: Medicare Other | Admitting: Cardiovascular Disease

## 2019-05-28 ENCOUNTER — Encounter: Payer: Self-pay | Admitting: Cardiovascular Disease

## 2019-05-28 ENCOUNTER — Telehealth (INDEPENDENT_AMBULATORY_CARE_PROVIDER_SITE_OTHER): Payer: Medicare Other | Admitting: Cardiovascular Disease

## 2019-05-28 ENCOUNTER — Telehealth: Payer: Self-pay

## 2019-05-28 VITALS — BP 116/75 | HR 72 | Ht 72.0 in | Wt 183.0 lb

## 2019-05-28 DIAGNOSIS — I480 Paroxysmal atrial fibrillation: Secondary | ICD-10-CM

## 2019-05-28 DIAGNOSIS — I4729 Other ventricular tachycardia: Secondary | ICD-10-CM

## 2019-05-28 DIAGNOSIS — I2510 Atherosclerotic heart disease of native coronary artery without angina pectoris: Secondary | ICD-10-CM | POA: Diagnosis not present

## 2019-05-28 DIAGNOSIS — E785 Hyperlipidemia, unspecified: Secondary | ICD-10-CM

## 2019-05-28 DIAGNOSIS — K449 Diaphragmatic hernia without obstruction or gangrene: Secondary | ICD-10-CM | POA: Diagnosis not present

## 2019-05-28 DIAGNOSIS — R079 Chest pain, unspecified: Secondary | ICD-10-CM

## 2019-05-28 DIAGNOSIS — I472 Ventricular tachycardia: Secondary | ICD-10-CM | POA: Diagnosis not present

## 2019-05-28 DIAGNOSIS — I2583 Coronary atherosclerosis due to lipid rich plaque: Secondary | ICD-10-CM | POA: Diagnosis not present

## 2019-05-28 NOTE — Telephone Encounter (Signed)
Received call from patients wife called and stated that yall discussed him doing a catherization. They wanted to let you know he is now agreeing to do it. Dr Alvy Bimler made aware.

## 2019-05-28 NOTE — Progress Notes (Signed)
Virtual Visit via Telephone Note   This visit type was conducted due to national recommendations for restrictions regarding the COVID-19 Pandemic (e.g. social distancing) in an effort to limit this patient's exposure and mitigate transmission in our community.  Due to his co-morbid illnesses, this patient is at least at moderate risk for complications without adequate follow up.  This format is felt to be most appropriate for this patient at this time.  The patient did not have access to video technology/had technical difficulties with video requiring transitioning to audio format only (telephone).  All issues noted in this document were discussed and addressed.  No physical exam could be performed with this format.  Please refer to the patient's chart for his  consent to telehealth for [facility].   The patient was identified using 2 identifiers.  Date:  05/28/2019   ID:  Anthony Kelly, DOB [birth year], MRN [id number]  Patient Location: Home Provider Location: Office  PCP:  Dettinger, [name], MD  Cardiologist:  Kate Sable, MD  Electrophysiologist:  None   Evaluation Performed:  Follow-Up Visit  Chief Complaint: Paroxysmal atrial fibrillation  History of Present Illness:    Anthony Kelly is a 74 y.o. male with paroxysmal atrial fibrillation.  Event monitoring demonstrated episodes of rapid atrial fibrillation with heart rates as high as 170-180 bpm.  There was also an episode of nonsustained ventricular tachycardia lasting 6 seconds at a rate of 208 bpm.  At his last evaluation I instructed him to start taking Toprol-XL 25 mg daily and apixaban 5 mg twice daily.  Coronary CT angiography demonstrated nonobstructive coronary artery disease in the LAD, left circumflex, and RCA.  He has also requested a second opinion from one of my colleagues regarding the cardiac CT.  I spoke to both the patient and his wife via speaker phone.  He has episodic palpitations but they have  been less frequent since the initiation of Toprol-XL.  He does get overheated and becomes dizzy when working outdoors.  At this time palpitations are somewhat worse.  They are very concerned because he has a strong family history of heart disease.  The patient and his wife told me that they did not actually request a second opinion but it was requested by his oncologist.  They said they were a bit confused by this as well.  Their question is regarding whether or not to pursue cardiac catheterization.  They seem to be eager to do so.    Past Medical History:  Diagnosis Date  . Acute pericarditis    a. 04/2013 -adm with CP, elevated CRP. H/o coronary artery calcification on prior CT but nuc was negative, EF 71%.  . Arthritis   . Atrial fibrillation ([place])    a. Isolated episode in the setting of acute pericarditis 04/2013. Was not placed on anticoag.  Marland Kitchen BPH (benign prostatic hyperplasia)   . Cataract   . Diverticulitis 08/16/2013  . Hyperlipidemia    elevated triglycerides  . 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 Pain in joint, pelvic region and thigh 08/01/2013  . PSVT (paroxysmal supraventricular tachycardia) (  [facility])    Past Surgical History:  Procedure Laterality Date  . CHOLECYSTECTOMY    . EXPLORATORY LAPAROTOMY    . EYE SURGERY Right 2016   cataract  . LUNG LOBECTOMY     right side  . LYMPH NODE BIOPSY     in groin with removal  . MENISCUS REPAIR     right knee  . PROSTATE SURGERY    . REFRACTIVE SURGERY Right    piece of metal removed  . SUBMANDIBULAR GLAND EXCISION  04/2011  . SUBMANDIBULAR GLAND  EXCISION  04/30/2011   Procedure: EXCISION SUBMANDIBULAR GLAND;  Surgeon: Jerrell Belfast, MD;  Location: [place];  Service: ENT;  Laterality: Left;  WITH DIAGNOSTIC BIOPSY  . TONSILLECTOMY     as a child  . VEIN LIGATION AND STRIPPING     right leg     Current Meds  Medication Sig  . acyclovir (ZOVIRAX) 400 MG tablet Take 1 tablet (400 mg total) by mouth 2 (two) times daily.  Marland Kitchen apixaban (ELIQUIS) 5 MG TABS tablet Take 1 tablet (5 mg total) by mouth 2 (two) times daily.  . brimonidine (ALPHAGAN) 0.2 % ophthalmic solution Place 1 drop into both eyes 2 (two) times daily.   . idelalisib (ZYDELIG) 150 MG tablet Take 1 tablet (150 mg total) by mouth 2 (two) times daily.  Marland Kitchen LUMIGAN 0.01 % SOLN Place 1 drop into both eyes daily.  . metoprolol succinate (TOPROL XL) 25 MG 24 hr tablet Take 1 tablet (25 mg total) by mouth daily.  . Multiple Vitamin (MULTIVITAMIN WITH MINERALS) TABS tablet Take 1 tablet by mouth every morning.   . NON FORMULARY Take by mouth daily. Beet Root  . omeprazole (PRILOSEC) 20 MG capsule Take 1 capsule (20 mg total) by mouth daily.  . ondansetron (ZOFRAN) 8 MG tablet Take 1 tablet (8 mg total) by mouth every 8 (eight) hours as needed for nausea.  Marland Kitchen oxyCODONE (OXY IR/ROXICODONE) 5 MG immediate release tablet Take 1 tablet (5 mg total) by mouth every 4 (four) hours as needed for severe pain.  . Probiotic Product (PROBIOTIC DAILY PO) Take 1 capsule by mouth daily.   . prochlorperazine (COMPAZINE) 10 MG tablet Take 1 tablet (10 mg total) by mouth every 6 (six) hours as needed for nausea or vomiting.  . rosuvastatin (CRESTOR) 20 MG tablet Take 1 tablet (20 mg total) by mouth daily.  Marland Kitchen terazosin (HYTRIN) 2 MG capsule Take 2 mg by mouth at bedtime.     Allergies:   Patient has no known allergies.   Social History   Tobacco Use  . Smoking status: Former Smoker    Packs/day: 1.50    Years: 25.00    Pack years: 37.50    Types: Cigarettes    Start date: 12/12/1962    Quit date:  01/11/1990    Years since quitting: 29.3  . Smokeless tobacco: Never Used  Substance Use Topics  . Alcohol use: No    Alcohol/week: 0.0 standard drinks  . Drug use: No     Family Hx: The patient's family history includes Alzheimer's disease in his brother and sister; Cancer in his brother, sister, and sister; Diabetes in his brother, sister, and son; Heart attack in his father and sister; Heart disease in his brother, brother, brother, brother, brother, brother, brother, and father. There is no history of Anesthesia problems.  ROS:   Please see the history of present illness.     All other systems reviewed and are negative.  Prior CV studies:   The following studies were reviewed today:  Coronary CT angiography 04/27/2019:  1. Left Main:  No significant stenosis.  2. LAD: No significant stenosis.  FFR 0.9 proximal, 0.84 distal 3. LCX: No significant stenosis. FFR 0.9 in the mid vessel, 0.84 distally 4. RCA: No significant stenosis. FFR 0.9 proximally, 0.87 mid, 0.80 distally  IMPRESSION: 1.  CT FFR analysis didn't show any significant stenosis.   Echocardiogram 05/22/2019:  1. Left ventricular ejection fraction, by estimation, is 60 to 65%. The  left ventricle has normal function. The left ventricle has no regional  wall motion abnormalities. There is mild left ventricular hypertrophy.  Left ventricular diastolic parameters  are indeterminate.  2. Right ventricular systolic function is normal. The right ventricular  size is mildly enlarged. Mildly increased right ventricular wall  thickness. Tricuspid regurgitation signal is inadequate for assessing PA  pressure.  3. Left atrial size was mildly dilated.  4. The mitral valve is grossly normal. Trivial mitral valve  regurgitation.  5. The aortic valve is tricuspid. Aortic valve regurgitation is not  visualized.  6. The inferior vena cava is normal in size with greater than 50%  respiratory variability,  suggesting right atrial pressure of 3 mmHg.   Labs/Other Tests and Data Reviewed:    EKG:  Event monitor demonstrated rapid atrial fibrillation with heart rates between 170-180 bpm.  6 seconds of nonsustained ventricular tachycardia also noted.  Recent Labs: 05/17/2019: ALT 17; BUN 13; Creatinine, Ser 1.19; Hemoglobin 16.3; Platelets 175; Potassium 4.4; Sodium 139   Recent Lipid Panel Lab Results  Component Value Date/Time   CHOL 137 03/22/2019 07:59 AM   CHOL 144 05/30/2012 09:09 AM   TRIG 75 03/22/2019 07:59 AM   TRIG 140 05/20/2014 11:03 AM   TRIG 153 (H) 05/30/2012 09:09 AM   HDL 61 03/22/2019 07:59 AM   HDL 42 05/20/2014 11:03 AM   HDL 36 (L) 05/30/2012 09:09 AM   CHOLHDL 2.2 03/22/2019 07:59 AM   CHOLHDL 2.3 01/01/2013 09:27 AM   LDLCALC 61 03/22/2019 07:59 AM   LDLCALC 77 05/30/2012 09:09 AM    Wt Readings from Last 3 Encounters:  05/28/19 183 lb (83 kg)  05/08/19 183 lb (83 kg)  04/10/19 189 lb 12.8 oz (86.1 kg)     Objective:    Vital Signs:  BP 116/75   Pulse 72   Ht 6' (1.829 m)   Wt 183 lb (83 kg)   BMI 24.82 kg/m    VITAL SIGNS:  reviewed  ASSESSMENT & PLAN:    1.  Paroxysmal atrial fibrillation: Event monitoring demonstrated paroxysms of rapid atrial fibrillation with heart rates as high as 170-180 bpm.  I have already initiated Toprol-XL 25 mg daily and Eliquis 5 mg twice daily for systemic anticoagulation.  Echocardiogram on 05/22/2019 demonstrated normal LV systolic function, LVEF 60 to 65%, and mild left atrial enlargement.  I will increase Toprol-XL to 37.5 mg daily.  2.  Chest discomfort/CAD: Coronary CT angiography demonstrated nonobstructive three-vessel disease.  Continue aggressive risk factor modification.  I previously switched pravastatin to rosuvastatin 20 mg daily.  I feel his symptoms are like related to moderate to large hiatal hernia.  That being said, they would like to pursue cardiac catheterization as recommended by his oncologist.  I  spoke to them about the data regarding coronary CT angiography and its correlation with cardiac catheterization.  I also spoke to them about the rationale for rosuvastatin and Toprol-XL.  Regardless, they would  like to pursue coronary angiography.  I will arrange for this.  He has decided to cancel the appointment with my colleague. Risks and benefits of cardiac catheterization have been discussed with the patient.  These include bleeding, infection, kidney damage, stroke, heart attack, death.  The patient understands these risks and is willing to proceed.  3.  Hiatal hernia: He is currently on omeprazole.  He does have a moderate to large hiatal hernia which is likely causing chest discomfort.  4.  Hypercholesterolemia: Given nonobstructive coronary disease, I want to pursue aggressive risk factor modification.  I previously switched pravastatin to rosuvastatin 20 mg daily.  5.  Nonsustained ventricular tachycardia: Event monitoring demonstrated 6 seconds of NSVT.  He is currently on Toprol-XL 25 mg daily.  He has no obstructive coronary artery disease and has normal LV systolic function.  I will increase Toprol-XL to 37.5 mg daily.  I am also arranging for coronary angiography at the patient's request.    COVID-19 Education: The signs and symptoms of COVID-19 were discussed with the patient and how to seek care for testing (follow up with PCP or arrange E-visit).  The importance of social distancing was discussed today.  Time:   Today, I have spent 40 minutes with the patient with telehealth technology discussing the above problems.     Medication Adjustments/Labs and Tests Ordered: Current medicines are reviewed at length with the patient today.  Concerns regarding medicines are outlined above.   Tests Ordered: No orders of the defined types were placed in this encounter.   Medication Changes: No orders of the defined types were placed in this encounter.   Follow Up: Office visit 1  month after cath  Signed, Kate Sable, MD  05/28/2019 8:49 AM    [place]

## 2019-05-28 NOTE — H&P (View-Only) (Signed)
Virtual Visit via Telephone Note   This visit type was conducted due to national recommendations for restrictions regarding the COVID-19 Pandemic (e.g. social distancing) in an effort to limit this patient's exposure and mitigate transmission in our community.  Due to his co-morbid illnesses, this patient is at least at moderate risk for complications without adequate follow up.  This format is felt to be most appropriate for this patient at this time.  The patient did not have access to video technology/had technical difficulties with video requiring transitioning to audio format only (telephone).  All issues noted in this document were discussed and addressed.  No physical exam could be performed with this format.  Please refer to the patient's chart for his  consent to telehealth for [facility].   The patient was identified using 2 identifiers.  Date:  05/28/2019   ID:  Anthony Kelly, DOB 02-20-1945, MRN [id number]  Patient Location: Home Provider Location: Office  PCP:  Dettinger, [name], MD  Cardiologist:  Kate Sable, MD  Electrophysiologist:  None   Evaluation Performed:  Follow-Up Visit  Chief Complaint: Paroxysmal atrial fibrillation  History of Present Illness:    Anthony Kelly is a 74 y.o. male with paroxysmal atrial fibrillation.  Event monitoring demonstrated episodes of rapid atrial fibrillation with heart rates as high as 170-180 bpm.  There was also an episode of nonsustained ventricular tachycardia lasting 6 seconds at a rate of 208 bpm.  At his last evaluation I instructed him to start taking Toprol-XL 25 mg daily and apixaban 5 mg twice daily.  Coronary CT angiography demonstrated nonobstructive coronary artery disease in the LAD, left circumflex, and RCA.  He has also requested a second opinion from one of my colleagues regarding the cardiac CT.  I spoke to both the patient and his wife via speaker phone.  He has episodic palpitations but they have  been less frequent since the initiation of Toprol-XL.  He does get overheated and becomes dizzy when working outdoors.  At this time palpitations are somewhat worse.  They are very concerned because he has a strong family history of heart disease.  The patient and his wife told me that they did not actually request a second opinion but it was requested by his oncologist.  They said they were a bit confused by this as well.  Their question is regarding whether or not to pursue cardiac catheterization.  They seem to be eager to do so.    Past Medical History:  Diagnosis Date  . Acute pericarditis    a. 04/2013 -adm with CP, elevated CRP. H/o coronary artery calcification on prior CT but nuc was negative, EF 71%.  . Arthritis   . Atrial fibrillation ([place])    a. Isolated episode in the setting of acute pericarditis 04/2013. Was not placed on anticoag.  Marland Kitchen BPH (benign prostatic hyperplasia)   . Cataract   . Diverticulitis 08/16/2013  . Hyperlipidemia    elevated triglycerides  . 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 Pain in joint, pelvic region and thigh 08/01/2013  . PSVT (paroxysmal supraventricular tachycardia) (  [facility])    Past Surgical History:  Procedure Laterality Date  . CHOLECYSTECTOMY    . EXPLORATORY LAPAROTOMY    . EYE SURGERY Right 2016   cataract  . LUNG LOBECTOMY     right side  . LYMPH NODE BIOPSY     in groin with removal  . MENISCUS REPAIR     right knee  . PROSTATE SURGERY    . REFRACTIVE SURGERY Right    piece of metal removed  . SUBMANDIBULAR GLAND EXCISION  04/2011  . SUBMANDIBULAR GLAND  EXCISION  04/30/2011   Procedure: EXCISION SUBMANDIBULAR GLAND;  Surgeon: Jerrell Belfast, MD;  Location: [place];  Service: ENT;  Laterality: Left;  WITH DIAGNOSTIC BIOPSY  . TONSILLECTOMY     as a child  . VEIN LIGATION AND STRIPPING     right leg     Current Meds  Medication Sig  . acyclovir (ZOVIRAX) 400 MG tablet Take 1 tablet (400 mg total) by mouth 2 (two) times daily.  Marland Kitchen apixaban (ELIQUIS) 5 MG TABS tablet Take 1 tablet (5 mg total) by mouth 2 (two) times daily.  . brimonidine (ALPHAGAN) 0.2 % ophthalmic solution Place 1 drop into both eyes 2 (two) times daily.   . idelalisib (ZYDELIG) 150 MG tablet Take 1 tablet (150 mg total) by mouth 2 (two) times daily.  Marland Kitchen LUMIGAN 0.01 % SOLN Place 1 drop into both eyes daily.  . metoprolol succinate (TOPROL XL) 25 MG 24 hr tablet Take 1 tablet (25 mg total) by mouth daily.  . Multiple Vitamin (MULTIVITAMIN WITH MINERALS) TABS tablet Take 1 tablet by mouth every morning.   . NON FORMULARY Take by mouth daily. Beet Root  . omeprazole (PRILOSEC) 20 MG capsule Take 1 capsule (20 mg total) by mouth daily.  . ondansetron (ZOFRAN) 8 MG tablet Take 1 tablet (8 mg total) by mouth every 8 (eight) hours as needed for nausea.  Marland Kitchen oxyCODONE (OXY IR/ROXICODONE) 5 MG immediate release tablet Take 1 tablet (5 mg total) by mouth every 4 (four) hours as needed for severe pain.  . Probiotic Product (PROBIOTIC DAILY PO) Take 1 capsule by mouth daily.   . prochlorperazine (COMPAZINE) 10 MG tablet Take 1 tablet (10 mg total) by mouth every 6 (six) hours as needed for nausea or vomiting.  . rosuvastatin (CRESTOR) 20 MG tablet Take 1 tablet (20 mg total) by mouth daily.  Marland Kitchen terazosin (HYTRIN) 2 MG capsule Take 2 mg by mouth at bedtime.     Allergies:   Patient has no known allergies.   Social History   Tobacco Use  . Smoking status: Former Smoker    Packs/day: 1.50    Years: 25.00    Pack years: 37.50    Types: Cigarettes    Start date: 12/12/1962    Quit date:  01/11/1990    Years since quitting: 29.3  . Smokeless tobacco: Never Used  Substance Use Topics  . Alcohol use: No    Alcohol/week: 0.0 standard drinks  . Drug use: No     Family Hx: The patient's family history includes Alzheimer's disease in his brother and sister; Cancer in his brother, sister, and sister; Diabetes in his brother, sister, and son; Heart attack in his father and sister; Heart disease in his brother, brother, brother, brother, brother, brother, brother, and father. There is no history of Anesthesia problems.  ROS:   Please see the history of present illness.     All other systems reviewed and are negative.  Prior CV studies:   The following studies were reviewed today:  Coronary CT angiography 04/27/2019:  1. Left Main:  No significant stenosis.  2. LAD: No significant stenosis.  FFR 0.9 proximal, 0.84 distal 3. LCX: No significant stenosis. FFR 0.9 in the mid vessel, 0.84 distally 4. RCA: No significant stenosis. FFR 0.9 proximally, 0.87 mid, 0.80 distally  IMPRESSION: 1.  CT FFR analysis didn't show any significant stenosis.   Echocardiogram 05/22/2019:  1. Left ventricular ejection fraction, by estimation, is 60 to 65%. The  left ventricle has normal function. The left ventricle has no regional  wall motion abnormalities. There is mild left ventricular hypertrophy.  Left ventricular diastolic parameters  are indeterminate.  2. Right ventricular systolic function is normal. The right ventricular  size is mildly enlarged. Mildly increased right ventricular wall  thickness. Tricuspid regurgitation signal is inadequate for assessing PA  pressure.  3. Left atrial size was mildly dilated.  4. The mitral valve is grossly normal. Trivial mitral valve  regurgitation.  5. The aortic valve is tricuspid. Aortic valve regurgitation is not  visualized.  6. The inferior vena cava is normal in size with greater than 50%  respiratory variability,  suggesting right atrial pressure of 3 mmHg.   Labs/Other Tests and Data Reviewed:    EKG:  Event monitor demonstrated rapid atrial fibrillation with heart rates between 170-180 bpm.  6 seconds of nonsustained ventricular tachycardia also noted.  Recent Labs: 05/17/2019: ALT 17; BUN 13; Creatinine, Ser 1.19; Hemoglobin 16.3; Platelets 175; Potassium 4.4; Sodium 139   Recent Lipid Panel Lab Results  Component Value Date/Time   CHOL 137 03/22/2019 07:59 AM   CHOL 144 05/30/2012 09:09 AM   TRIG 75 03/22/2019 07:59 AM   TRIG 140 05/20/2014 11:03 AM   TRIG 153 (H) 05/30/2012 09:09 AM   HDL 61 03/22/2019 07:59 AM   HDL 42 05/20/2014 11:03 AM   HDL 36 (L) 05/30/2012 09:09 AM   CHOLHDL 2.2 03/22/2019 07:59 AM   CHOLHDL 2.3 01/01/2013 09:27 AM   LDLCALC 61 03/22/2019 07:59 AM   LDLCALC 77 05/30/2012 09:09 AM    Wt Readings from Last 3 Encounters:  05/28/19 183 lb (83 kg)  05/08/19 183 lb (83 kg)  04/10/19 189 lb 12.8 oz (86.1 kg)     Objective:    Vital Signs:  BP 116/75   Pulse 72   Ht 6' (1.829 m)   Wt 183 lb (83 kg)   BMI 24.82 kg/m    VITAL SIGNS:  reviewed  ASSESSMENT & PLAN:    1.  Paroxysmal atrial fibrillation: Event monitoring demonstrated paroxysms of rapid atrial fibrillation with heart rates as high as 170-180 bpm.  I have already initiated Toprol-XL 25 mg daily and Eliquis 5 mg twice daily for systemic anticoagulation.  Echocardiogram on 05/22/2019 demonstrated normal LV systolic function, LVEF 60 to 65%, and mild left atrial enlargement.  I will increase Toprol-XL to 37.5 mg daily.  2.  Chest discomfort/CAD: Coronary CT angiography demonstrated nonobstructive three-vessel disease.  Continue aggressive risk factor modification.  I previously switched pravastatin to rosuvastatin 20 mg daily.  I feel his symptoms are like related to moderate to large hiatal hernia.  That being said, they would like to pursue cardiac catheterization as recommended by his oncologist.  I  spoke to them about the data regarding coronary CT angiography and its correlation with cardiac catheterization.  I also spoke to them about the rationale for rosuvastatin and Toprol-XL.  Regardless, they would  like to pursue coronary angiography.  I will arrange for this.  He has decided to cancel the appointment with my colleague. Risks and benefits of cardiac catheterization have been discussed with the patient.  These include bleeding, infection, kidney damage, stroke, heart attack, death.  The patient understands these risks and is willing to proceed.  3.  Hiatal hernia: He is currently on omeprazole.  He does have a moderate to large hiatal hernia which is likely causing chest discomfort.  4.  Hypercholesterolemia: Given nonobstructive coronary disease, I want to pursue aggressive risk factor modification.  I previously switched pravastatin to rosuvastatin 20 mg daily.  5.  Nonsustained ventricular tachycardia: Event monitoring demonstrated 6 seconds of NSVT.  He is currently on Toprol-XL 25 mg daily.  He has no obstructive coronary artery disease and has normal LV systolic function.  I will increase Toprol-XL to 37.5 mg daily.  I am also arranging for coronary angiography at the patient's request.    COVID-19 Education: The signs and symptoms of COVID-19 were discussed with the patient and how to seek care for testing (follow up with PCP or arrange E-visit).  The importance of social distancing was discussed today.  Time:   Today, I have spent 40 minutes with the patient with telehealth technology discussing the above problems.     Medication Adjustments/Labs and Tests Ordered: Current medicines are reviewed at length with the patient today.  Concerns regarding medicines are outlined above.   Tests Ordered: No orders of the defined types were placed in this encounter.   Medication Changes: No orders of the defined types were placed in this encounter.   Follow Up: Office visit 1  month after cath  Signed, Kate Sable, MD  05/28/2019 8:49 AM    [place]

## 2019-05-29 ENCOUNTER — Encounter: Payer: Self-pay | Admitting: *Deleted

## 2019-05-31 ENCOUNTER — Encounter: Payer: Self-pay | Admitting: *Deleted

## 2019-05-31 ENCOUNTER — Encounter: Payer: Self-pay | Admitting: Hematology and Oncology

## 2019-05-31 ENCOUNTER — Telehealth: Payer: Self-pay | Admitting: Cardiovascular Disease

## 2019-05-31 ENCOUNTER — Other Ambulatory Visit: Payer: Self-pay | Admitting: *Deleted

## 2019-05-31 DIAGNOSIS — I4729 Other ventricular tachycardia: Secondary | ICD-10-CM

## 2019-05-31 DIAGNOSIS — I480 Paroxysmal atrial fibrillation: Secondary | ICD-10-CM

## 2019-05-31 NOTE — Telephone Encounter (Signed)
Pre-cert Verification for the following procedure     Cath scheduled for Monday, 06/04/2019 - 7:30 - [facility]

## 2019-06-01 ENCOUNTER — Telehealth: Payer: Self-pay | Admitting: Cardiovascular Disease

## 2019-06-01 ENCOUNTER — Other Ambulatory Visit: Payer: Self-pay | Admitting: Cardiovascular Disease

## 2019-06-01 DIAGNOSIS — I2510 Atherosclerotic heart disease of native coronary artery without angina pectoris: Secondary | ICD-10-CM

## 2019-06-01 MED ORDER — SODIUM CHLORIDE 0.9% FLUSH: 3.0000 mL | Freq: Two times a day (BID) | INTRAVENOUS | Status: DC

## 2019-06-01 NOTE — Telephone Encounter (Signed)
Cath schedule for Tuesday, 5/25 [facility]

## 2019-06-02 ENCOUNTER — Other Ambulatory Visit (HOSPITAL_COMMUNITY)
Admission: RE | Admit: 2019-06-02 | Discharge: 2019-06-02 | Disposition: A | Discharge status: Home / Self Care | Payer: Medicare Other | Source: Ambulatory Visit | Attending: Interventional Cardiology | Admitting: Interventional Cardiology

## 2019-06-02 DIAGNOSIS — Z20822 Contact with and (suspected) exposure to covid-19: Secondary | ICD-10-CM | POA: Insufficient documentation

## 2019-06-02 DIAGNOSIS — Z01812 Encounter for preprocedural laboratory examination: Secondary | ICD-10-CM | POA: Diagnosis not present

## 2019-06-02 LAB — SARS CORONAVIRUS 2 (TAT 6-24 HRS): SARS Coronavirus 2: NEGATIVE

## 2019-06-04 ENCOUNTER — Telehealth: Payer: Self-pay | Admitting: *Deleted

## 2019-06-04 NOTE — Telephone Encounter (Signed)
Pt contacted pre-catheterization scheduled at [facility] for: Tuesday Jun 05, 2019 7:30 AM Verified arrival time and place: [place] [facility]) at: 5:30 AM   No solid food after midnight prior to cath, clear liquids until 5 AM day of procedure.  Hold: Eliquis-none 06/03/19 until post procedure  Except hold medications AM meds can be  taken pre-cath with sip of water including: ASA 81 mg   Confirmed patient has responsible adult to drive home post procedure and observe 24 hours after arriving home: yes  You are allowed ONE visitor in the waiting room during your procedure. Both you and your visitor must wear masks.      COVID-19 Pre-Screening Questions:  . In the past 7 to 10 days have you had a cough,  shortness of breath, headache, congestion, fever (100 or greater) body aches, chills, sore throat, or sudden loss of taste or sense of smell? no . Have you been around anyone with known Covid 19 in the past 7 to 10 days? no . Have you been around anyone who is awaiting Covid 19 test results in the past 7 to 10 days? no . Have you been around anyone who has mentioned symptoms of Covid 19 within the past 7 to 10 days? No  Reviewed procedure/mask/visitor instructions, COVID-19 screening questions with patient.

## 2019-06-05 ENCOUNTER — Encounter: Payer: Self-pay | Admitting: Hematology and Oncology

## 2019-06-05 ENCOUNTER — Encounter (HOSPITAL_COMMUNITY)
Admission: RE | Disposition: A | Discharge status: Home / Self Care | Payer: Self-pay | Source: Home / Self Care | Attending: Interventional Cardiology

## 2019-06-05 ENCOUNTER — Other Ambulatory Visit: Payer: Self-pay

## 2019-06-05 ENCOUNTER — Ambulatory Visit (HOSPITAL_COMMUNITY)
Admission: RE | Admit: 2019-06-05 | Discharge: 2019-06-05 | Disposition: A | Discharge status: Home / Self Care | Payer: Medicare Other | Attending: Interventional Cardiology | Admitting: Interventional Cardiology

## 2019-06-05 DIAGNOSIS — I480 Paroxysmal atrial fibrillation: Secondary | ICD-10-CM | POA: Insufficient documentation

## 2019-06-05 DIAGNOSIS — I472 Ventricular tachycardia: Secondary | ICD-10-CM | POA: Insufficient documentation

## 2019-06-05 DIAGNOSIS — Z79899 Other long term (current) drug therapy: Secondary | ICD-10-CM | POA: Insufficient documentation

## 2019-06-05 DIAGNOSIS — E7800 Pure hypercholesterolemia, unspecified: Secondary | ICD-10-CM | POA: Diagnosis not present

## 2019-06-05 DIAGNOSIS — Z8249 Family history of ischemic heart disease and other diseases of the circulatory system: Secondary | ICD-10-CM | POA: Insufficient documentation

## 2019-06-05 DIAGNOSIS — K449 Diaphragmatic hernia without obstruction or gangrene: Secondary | ICD-10-CM | POA: Insufficient documentation

## 2019-06-05 DIAGNOSIS — N400 Enlarged prostate without lower urinary tract symptoms: Secondary | ICD-10-CM | POA: Insufficient documentation

## 2019-06-05 DIAGNOSIS — Z7901 Long term (current) use of anticoagulants: Secondary | ICD-10-CM | POA: Diagnosis not present

## 2019-06-05 DIAGNOSIS — Z87891 Personal history of nicotine dependence: Secondary | ICD-10-CM | POA: Insufficient documentation

## 2019-06-05 DIAGNOSIS — I2583 Coronary atherosclerosis due to lipid rich plaque: Secondary | ICD-10-CM

## 2019-06-05 DIAGNOSIS — H269 Unspecified cataract: Secondary | ICD-10-CM | POA: Insufficient documentation

## 2019-06-05 DIAGNOSIS — I25118 Atherosclerotic heart disease of native coronary artery with other forms of angina pectoris: Secondary | ICD-10-CM

## 2019-06-05 DIAGNOSIS — E785 Hyperlipidemia, unspecified: Secondary | ICD-10-CM | POA: Insufficient documentation

## 2019-06-05 DIAGNOSIS — M1990 Unspecified osteoarthritis, unspecified site: Secondary | ICD-10-CM | POA: Insufficient documentation

## 2019-06-05 HISTORY — PX: LEFT HEART CATH AND CORONARY ANGIOGRAPHY: CATH118249

## 2019-06-05 SURGERY — LEFT HEART CATH AND CORONARY ANGIOGRAPHY
Anesthesia: LOCAL

## 2019-06-05 MED ORDER — SODIUM CHLORIDE 0.9 % IV SOLN: INTRAVENOUS | Status: AC

## 2019-06-05 MED ORDER — ONDANSETRON HCL 4 MG/2ML IJ SOLN: 4.0000 mg | Freq: Four times a day (QID) | INTRAMUSCULAR | Status: DC | PRN

## 2019-06-05 MED ORDER — SODIUM CHLORIDE 0.9% FLUSH: 3.0000 mL | Freq: Two times a day (BID) | INTRAVENOUS | Status: DC

## 2019-06-05 MED ORDER — VERAPAMIL HCL 2.5 MG/ML IV SOLN
INTRAVENOUS | Status: AC
  Filled 2019-06-05: qty 2

## 2019-06-05 MED ORDER — ACETAMINOPHEN 325 MG PO TABS: 650.0000 mg | ORAL_TABLET | ORAL | Status: DC | PRN

## 2019-06-05 MED ORDER — SODIUM CHLORIDE 0.9 % IV SOLN: 250.0000 mL | INTRAVENOUS | Status: DC | PRN

## 2019-06-05 MED ORDER — HYDRALAZINE HCL 20 MG/ML IJ SOLN: 10.0000 mg | INTRAMUSCULAR | Status: DC | PRN

## 2019-06-05 MED ORDER — MIDAZOLAM HCL 2 MG/2ML IJ SOLN
INTRAMUSCULAR | Status: AC
  Filled 2019-06-05: qty 2

## 2019-06-05 MED ORDER — APIXABAN 5 MG PO TABS: 5.0000 mg | ORAL_TABLET | Freq: Two times a day (BID) | ORAL | 1 refills | Status: DC

## 2019-06-05 MED ORDER — FENTANYL CITRATE (PF) 100 MCG/2ML IJ SOLN
INTRAMUSCULAR | Status: DC | PRN
  Administered 2019-06-05 (×2): 25 ug via INTRAVENOUS

## 2019-06-05 MED ORDER — LIDOCAINE HCL (PF) 1 % IJ SOLN
INTRAMUSCULAR | Status: DC | PRN
  Administered 2019-06-05: 3 mL

## 2019-06-05 MED ORDER — FENTANYL CITRATE (PF) 100 MCG/2ML IJ SOLN
INTRAMUSCULAR | Status: AC
  Filled 2019-06-05: qty 2

## 2019-06-05 MED ORDER — HEPARIN (PORCINE) IN NACL 1000-0.9 UT/500ML-% IV SOLN
INTRAVENOUS | Status: AC
  Filled 2019-06-05: qty 1000

## 2019-06-05 MED ORDER — SODIUM CHLORIDE 0.9% FLUSH: 3.0000 mL | INTRAVENOUS | Status: DC | PRN

## 2019-06-05 MED ORDER — SODIUM CHLORIDE 0.9 % WEIGHT BASED INFUSION: 1.0000 mL/kg/h | INTRAVENOUS | Status: DC

## 2019-06-05 MED ORDER — ASPIRIN 81 MG PO CHEW: 81.0000 mg | CHEWABLE_TABLET | ORAL | Status: DC

## 2019-06-05 MED ORDER — MIDAZOLAM HCL 2 MG/2ML IJ SOLN
INTRAMUSCULAR | Status: DC | PRN
  Administered 2019-06-05: 1 mg via INTRAVENOUS
  Administered 2019-06-05: 2 mg via INTRAVENOUS

## 2019-06-05 MED ORDER — HEPARIN SODIUM (PORCINE) 1000 UNIT/ML IJ SOLN
INTRAMUSCULAR | Status: DC | PRN
  Administered 2019-06-05: 4000 [IU] via INTRAVENOUS

## 2019-06-05 MED ORDER — SODIUM CHLORIDE 0.9 % WEIGHT BASED INFUSION
3.0000 mL/kg/h | INTRAVENOUS | Status: AC
  Administered 2019-06-05: 3 mL/kg/h via INTRAVENOUS

## 2019-06-05 MED ORDER — LABETALOL HCL 5 MG/ML IV SOLN: 10.0000 mg | INTRAVENOUS | Status: DC | PRN

## 2019-06-05 MED ORDER — IOHEXOL 350 MG/ML SOLN
INTRAVENOUS | Status: DC | PRN
  Administered 2019-06-05: 100 mL

## 2019-06-05 MED ORDER — VERAPAMIL HCL 2.5 MG/ML IV SOLN
INTRAVENOUS | Status: DC | PRN
  Administered 2019-06-05: 10 mL via INTRA_ARTERIAL

## 2019-06-05 MED ORDER — HEPARIN (PORCINE) IN NACL 1000-0.9 UT/500ML-% IV SOLN
INTRAVENOUS | Status: DC | PRN
  Administered 2019-06-05 (×2): 500 mL

## 2019-06-05 MED ORDER — LIDOCAINE HCL (PF) 1 % IJ SOLN
INTRAMUSCULAR | Status: AC
  Filled 2019-06-05: qty 30

## 2019-06-05 SURGICAL SUPPLY — 10 items
CATH 5FR JL3.5 JR4 ANG PIG MP (CATHETERS) ×1 IMPLANT
CATH INFINITI 5 FR 3DRC (CATHETERS) ×1 IMPLANT
DEVICE RAD COMP TR BAND LRG (VASCULAR PRODUCTS) ×1 IMPLANT
GLIDESHEATH SLEND SS 6F .021 (SHEATH) ×1 IMPLANT
GUIDEWIRE INQWIRE 1.5J.035X260 (WIRE) IMPLANT
INQWIRE 1.5J .035X260CM (WIRE) ×2
KIT HEART LEFT (KITS) ×2 IMPLANT
PACK CARDIAC CATHETERIZATION (CUSTOM PROCEDURE TRAY) ×2 IMPLANT
TRANSDUCER W/STOPCOCK (MISCELLANEOUS) ×2 IMPLANT
TUBING CIL FLEX 10 FLL-RA (TUBING) ×2 IMPLANT

## 2019-06-05 NOTE — Interval H&P Note (Signed)
Cath Lab Visit (complete for each Cath Lab visit)  Clinical Evaluation Leading to the Procedure:   ACS: No.  Non-ACS:    Anginal Classification: CCS III  Anti-ischemic medical therapy: Minimal Therapy (1 class of medications)  Non-Invasive Test Results: Intermediate-risk stress test findings: cardiac mortality 1-3%/year  Prior CABG: No previous CABG      History and Physical Interval Note:  06/05/2019 7:33 AM  Anthony Kelly  has presented today for surgery, with the diagnosis of chest pain, CAD due lipid rich plaque.  The various methods of treatment have been discussed with the patient and family. After consideration of risks, benefits and other options for treatment, the patient has consented to  Procedure(s): LEFT HEART CATH AND CORONARY ANGIOGRAPHY (N/A) as a surgical intervention.  The patient's history has been reviewed, patient examined, no change in status, stable for surgery.  I have reviewed the patient's chart and labs.  Questions were answered to the patient's satisfaction.     Larae Grooms

## 2019-06-05 NOTE — Discharge Instructions (Signed)
Radial Site Care  This sheet gives you information about how to care for yourself after your procedure. Your health care provider may also give you more specific instructions. If you have problems or questions, contact your health care provider. What can I expect after the procedure? After the procedure, it is common to have:  Bruising and tenderness at the catheter insertion area. Follow these instructions at home: Medicines  Take over-the-counter and prescription medicines only as told by your health care provider. Insertion site care  Follow instructions from your health care provider about how to take care of your insertion site. Make sure you: ? Wash your hands with soap and water before you change your bandage (dressing). If soap and water are not available, use hand sanitizer. ? Change your dressing as told by your health care provider. ? Leave stitches (sutures), skin glue, or adhesive strips in place. These skin closures may need to stay in place for 2 weeks or longer. If adhesive strip edges start to loosen and curl up, you may trim the loose edges. Do not remove adhesive strips completely unless your health care provider tells you to do that.  Check your insertion site every day for signs of infection. Check for: ? Redness, swelling, or pain. ? Fluid or blood. ? Pus or a bad smell. ? Warmth.  Do not take baths, swim, or use a hot tub until your health care provider approves.  You may shower 24-48 hours after the procedure, or as directed by your health care provider. ? Remove the dressing and gently wash the site with plain soap and water. ? Pat the area dry with a clean towel. ? Do not rub the site. That could cause bleeding.  Do not apply powder or lotion to the site. Activity   For 24 hours after the procedure, or as directed by your health care provider: ? Do not flex or bend the affected arm. ? Do not push or pull heavy objects with the affected arm. ? Do not  drive yourself home from the hospital or clinic. You may drive 24 hours after the procedure unless your health care provider tells you not to. ? Do not operate machinery or power tools.  Do not lift anything that is heavier than 10 lb (4.5 kg), or the limit that you are told, until your health care provider says that it is safe.  Ask your health care provider when it is okay to: ? Return to work or school. ? Resume usual physical activities or sports. ? Resume sexual activity. General instructions  If the catheter site starts to bleed, raise your arm and put firm pressure on the site. If the bleeding does not stop, get help right away. This is a medical emergency.  If you went home on the same day as your procedure, a responsible adult should be with you for the first 24 hours after you arrive home.  Keep all follow-up visits as told by your health care provider. This is important. Contact a health care provider if:  You have a fever.  You have redness, swelling, or yellow drainage around your insertion site. Get help right away if:  You have unusual pain at the radial site.  The catheter insertion area swells very fast.  The insertion area is bleeding, and the bleeding does not stop when you hold steady pressure on the area.  Your arm or hand becomes pale, cool, tingly, or numb. These symptoms may represent a serious problem   that is an emergency. Do not wait to see if the symptoms will go away. Get medical help right away. Call your local emergency services (911 in the [place] Do not drive yourself to the hospital. Summary  After the procedure, it is common to have bruising and tenderness at the site.  Follow instructions from your health care provider about how to take care of your radial site wound. Check the wound every day for signs of infection.  Do not lift anything that is heavier than 10 lb (4.5 kg), or the limit that you are told, until your health care provider says  that it is safe. This information is not intended to replace advice given to you by your health care provider. Make sure you discuss any questions you have with your health care provider. Document Revised: 02/02/2017 Document Reviewed: 02/02/2017 Elsevier Patient Education  2020 [employer] Inc[employer]  

## 2019-06-07 ENCOUNTER — Ambulatory Visit: Payer: Medicare Other | Admitting: Cardiology

## 2019-06-12 ENCOUNTER — Other Ambulatory Visit: Payer: Self-pay | Admitting: *Deleted

## 2019-06-12 MED ORDER — APIXABAN 5 MG PO TABS: 5.0000 mg | ORAL_TABLET | Freq: Two times a day (BID) | ORAL | 1 refills | Status: DC

## 2019-06-15 ENCOUNTER — Telehealth: Payer: Self-pay

## 2019-06-19 NOTE — Telephone Encounter (Signed)
Spoke with pt regarding virtual appt on 06/20/19. Pt was informed I will call prior to appt to go over vitals and medications. Pt agreed and confirmed his virtual visit.

## 2019-06-20 ENCOUNTER — Telehealth: Payer: Self-pay | Admitting: *Deleted

## 2019-06-20 ENCOUNTER — Telehealth (INDEPENDENT_AMBULATORY_CARE_PROVIDER_SITE_OTHER): Payer: Medicare Other | Admitting: Internal Medicine

## 2019-06-20 ENCOUNTER — Encounter: Payer: Self-pay | Admitting: Internal Medicine

## 2019-06-20 ENCOUNTER — Other Ambulatory Visit: Payer: Self-pay

## 2019-06-20 VITALS — BP 120/69 | HR 64 | Ht 72.0 in | Wt 185.0 lb

## 2019-06-20 DIAGNOSIS — I472 Ventricular tachycardia: Secondary | ICD-10-CM | POA: Diagnosis not present

## 2019-06-20 DIAGNOSIS — I4729 Other ventricular tachycardia: Secondary | ICD-10-CM

## 2019-06-20 DIAGNOSIS — E785 Hyperlipidemia, unspecified: Secondary | ICD-10-CM | POA: Diagnosis not present

## 2019-06-20 DIAGNOSIS — I480 Paroxysmal atrial fibrillation: Secondary | ICD-10-CM | POA: Diagnosis not present

## 2019-06-20 MED ORDER — FLECAINIDE ACETATE 50 MG PO TABS
50.0000 mg | ORAL_TABLET | Freq: Two times a day (BID) | ORAL | 3 refills | Status: DC
Start: 2019-06-20 — End: 2019-07-06

## 2019-06-20 NOTE — Progress Notes (Signed)
Electrophysiology TeleHealth Note   Due to national recommendations of social distancing due to [place] 19, Audio/video telehealth visit is felt to be most appropriate for this patient at this time.  See MyChart message from today for patient consent regarding telehealth for [facility].   Date:  06/20/2019   ID:  Anthony Kelly, DOB [birth year], MRN [id number]  Location: home Provider location: [facility] Evaluation Performed: New patient consult  PCP:  Dettinger, [name], MD  Cardiologist:  Kate Sable, MD  Electrophysiologist:  None   Chief Complaint:  afib  History of Present Illness:    Anthony Kelly is a 74 y.o. male who presents via audio/video conferencing for a telehealth visit today.   The patient is referred for new consultation regarding afib by Dr Bronson Ing.  He reports having episodes of SVT for about 15 several years.  He was managed by Dr Irish Lack and has done well.  He had pericarditis in 2015 with afib detected.  Recently, he has had increasing palpitations.  He had an event monitor placed 04/2019 (personally reviewed) which showed afib with RVR, sinus with PACs/PVCs and nonstained VT.  His previously discontinued metoprolol was restarted and he did have improvement but not resolutions of symptoms.  Further titration of this medicine has been limited by bradycardia (asymptomatic).  He underwent subsequent cath (reviiewed) which shows no obstructive CAD.  Today, he denies symptoms of chest pain, shortness of breath, orthopnea, PND, lower extremity edema, claudication, dizziness, presyncope, syncope, bleeding, or neurologic sequela. The patient is tolerating medications without difficulties and is otherwise without complaint today.     Past Medical History:  Diagnosis Date  . Acute pericarditis    a. 04/2013 -adm with CP, elevated CRP. H/o coronary artery calcification on prior CT but nuc was negative, EF 71%.  . Arthritis   . Atrial fibrillation ([place])    a.  Isolated episode in the setting of acute pericarditis 04/2013. Was not placed on anticoag.  Marland Kitchen BPH (benign prostatic hyperplasia)   . Cataract   . Diverticulitis 08/16/2013  . Hyperlipidemia    elevated triglycerides  . 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 Pain in joint, pelvic region and thigh 08/01/2013  . PSVT (paroxysmal supraventricular tachycardia) ([place])     Past Surgical History:  Procedure Laterality Date  . CHOLECYSTECTOMY    . EXPLORATORY LAPAROTOMY    . EYE SURGERY Right 2016   cataract  . LEFT HEART CATH AND CORONARY ANGIOGRAPHY N/A 06/05/2019   Procedure: LEFT HEART CATH AND CORONARY ANGIOGRAPHY;  Surgeon: Jettie Booze, MD;  Location: [place] [facility];  Service: Cardiovascular;  Laterality: N/A;  . LUNG LOBECTOMY     right side  . LYMPH NODE BIOPSY     in groin with removal  . MENISCUS REPAIR     right knee  . PROSTATE SURGERY    . REFRACTIVE SURGERY Right    piece of metal removed  . SUBMANDIBULAR GLAND EXCISION  04/2011  . SUBMANDIBULAR GLAND EXCISION  04/30/2011   Procedure: EXCISION SUBMANDIBULAR GLAND;  Surgeon: Jerrell Belfast, MD;  Location:  [facility] OR;  Service: ENT;  Laterality: Left;  WITH DIAGNOSTIC BIOPSY  . TONSILLECTOMY     as a child  . VEIN LIGATION AND STRIPPING     right leg    Current Outpatient Medications  Medication Sig Dispense Refill  . acyclovir (ZOVIRAX) 400 MG tablet Take 1 tablet (400 mg total) by mouth 2 (two) times daily. 180 tablet 6  . apixaban (ELIQUIS) 5 MG TABS tablet Take 1 tablet (5 mg total) by mouth 2 (two) times daily. 180 tablet 1  . brimonidine  (ALPHAGAN) 0.2 % ophthalmic solution Place 1 drop into both eyes 2 (two) times daily.     . idelalisib (ZYDELIG) 150 MG tablet Take 1 tablet (150 mg total) by mouth 2 (two) times daily. 60 tablet 11  . LUMIGAN 0.01 % SOLN Place 1 drop into both eyes daily.    . metoprolol succinate (TOPROL XL) 25 MG 24 hr tablet Take 1 tablet (25 mg total) by mouth daily. 90 tablet 1  . Multiple Vitamin (MULTIVITAMIN WITH MINERALS) TABS tablet Take 1 tablet by mouth daily.     . NON FORMULARY Take 4 oz by mouth daily. Beet Root     . omeprazole (PRILOSEC) 20 MG capsule Take 1 capsule (20 mg total) by mouth daily. (Patient taking differently: Take 20 mg by mouth daily as needed (heartburn). ) 90 capsule 3  . ondansetron (ZOFRAN) 8 MG tablet Take 1 tablet (8 mg total) by mouth every 8 (eight) hours as needed for nausea. 30 tablet 3  . oxyCODONE (OXY IR/ROXICODONE) 5 MG immediate release tablet Take 1 tablet (5 mg total) by mouth every 4 (four) hours as needed for severe pain. 60 tablet 0  . Probiotic Product (PROBIOTIC DAILY PO) Take 1 capsule by mouth daily.     . prochlorperazine (COMPAZINE) 10 MG tablet Take 1 tablet (10 mg total) by mouth every 6 (six) hours as needed for nausea or vomiting. 30 tablet 0  . rosuvastatin (CRESTOR) 20 MG tablet Take 1 tablet (20 mg total) by mouth daily. (Patient taking differently: Take 20 mg by mouth at bedtime. ) 90 tablet 3  . terazosin (HYTRIN) 2 MG capsule Take 2 mg by mouth at bedtime.     Current Facility-Administered Medications  Medication Dose Route Frequency Provider Last Rate Last Admin  . sodium chloride flush (NS) 0.9 % injection 3 mL  3 mL Intravenous Q12H [name], MD        Allergies:   Patient has no known allergies.   Social History:  The patient  reports that he quit smoking about 29 years ago. His smoking use included cigarettes. He started smoking about 56 years ago. He has a 37.50 pack-year smoking history. He has never used smokeless tobacco.  He reports that he does not drink alcohol or use drugs.   Family History:  The patient's family history includes Alzheimer's disease in his brother and sister; Cancer in his brother, sister, and sister; Diabetes in his brother, sister, and son; Heart attack in his father and sister; Heart disease in his brother, brother, brother, brother, brother, brother, brother, and father.    ROS:  Please see the history of present illness.   All other systems are personally reviewed and negative.    Exam:    Vital Signs:  BP 120/69   Pulse 64   Ht 6' (1.829 m)   Wt 185 lb (83.9 kg)   BMI 25.09 kg/m    Well appearing, alert  and conversant, regular work of breathing,  good skin color Eyes- anicteric, neuro- grossly intact, skin- no apparent rash or lesions or cyanosis, mouth- oral mucosa is pink   Labs/Other Tests and Data Reviewed:    Recent Labs: 05/17/2019: ALT 17; BUN 13; Creatinine, Ser 1.19; Hemoglobin 16.3; Platelets 175; Potassium 4.4; Sodium 139   Wt Readings from Last 3 Encounters:  06/20/19 185 lb (83.9 kg)  06/05/19 182 lb (82.6 kg)  05/28/19 183 lb (83 kg)     Other studies personally reviewed: Additional studies/ records that were reviewed today include: echo, myoview, office notes, cath  Review of the above records today demonstrates: as above   ASSESSMENT & PLAN:    1.  Palpitations multifactorial Due to PACs, PVCs, afib and NSVT I would therefore advise AAD therapy at this time. We discussed risks and benefits to flecainide, tikosyn, and amiodarone at length.  The patient understands risks and wishes to proceed. chads2vasc score is 1.  He has been started on eliquis.  Follow-up:  With me in [place] for an ekg and further discussions in 2-3 weeks. He will need close follow-up to avoid toxicity on AAD thearpy  2. HL Continue statin  Patient Risk:  after full review of this patients clinical status, I feel that they are at moderate risk at this time.   Today, I have  spent 20 minutes with the patient with telehealth technology discussing afib .    Signed, Thompson Grayer MD, [facility] 06/20/2019 9:15 AM   [facility] [address] [place] [address] [phone] (office) [phone] (fax)

## 2019-06-20 NOTE — Telephone Encounter (Signed)
-----   Message from Thompson Grayer, MD sent at 06/20/2019  9:23 AM EDT ----- Flecainide 50mg  BID Call in to [place] [facility]  Then schedule him to follow-up with me when I am in [place] 6/25 for in office visit

## 2019-06-20 NOTE — Telephone Encounter (Signed)
Pt aware and agreeable. Advised to call office with any issues after Flecainide start. Scheduled to f/u w/ Dr. Rayann Heman on 6/25 in [place] office.  Will send medication instructions via Mychart. Patient verbalized understanding and agreeable to plan.

## 2019-06-25 ENCOUNTER — Encounter: Payer: Self-pay | Admitting: Hematology and Oncology

## 2019-06-27 ENCOUNTER — Other Ambulatory Visit: Payer: Self-pay | Admitting: *Deleted

## 2019-06-27 MED ORDER — APIXABAN 5 MG PO TABS: 5.0000 mg | ORAL_TABLET | Freq: Two times a day (BID) | ORAL | 1 refills | Status: DC

## 2019-07-06 ENCOUNTER — Ambulatory Visit (INDEPENDENT_AMBULATORY_CARE_PROVIDER_SITE_OTHER): Payer: Medicare Other | Admitting: Internal Medicine

## 2019-07-06 ENCOUNTER — Encounter: Payer: Self-pay | Admitting: Internal Medicine

## 2019-07-06 ENCOUNTER — Other Ambulatory Visit: Payer: Self-pay

## 2019-07-06 VITALS — BP 100/64 | HR 64 | Ht 72.0 in | Wt 193.0 lb

## 2019-07-06 DIAGNOSIS — I2510 Atherosclerotic heart disease of native coronary artery without angina pectoris: Secondary | ICD-10-CM

## 2019-07-06 DIAGNOSIS — I4729 Other ventricular tachycardia: Secondary | ICD-10-CM

## 2019-07-06 DIAGNOSIS — E785 Hyperlipidemia, unspecified: Secondary | ICD-10-CM

## 2019-07-06 DIAGNOSIS — I472 Ventricular tachycardia: Secondary | ICD-10-CM | POA: Diagnosis not present

## 2019-07-06 DIAGNOSIS — I480 Paroxysmal atrial fibrillation: Secondary | ICD-10-CM

## 2019-07-06 MED ORDER — FLECAINIDE ACETATE 50 MG PO TABS: 50.0000 mg | ORAL_TABLET | Freq: Two times a day (BID) | ORAL | 3 refills | Status: DC

## 2019-07-06 NOTE — Patient Instructions (Addendum)
Medication Instructions:   Your physician recommends that you continue on your current medications as directed. Please refer to the Current Medication list given to you today.  Labwork:  NONE  Testing/Procedures:  NONE  Follow-Up:  Your physician recommends that you schedule a follow-up appointment in: 3 months (office).  Any Other Special Instructions Will Be Listed Below (If Applicable).  If you need a refill on your cardiac medications before your next appointment, please call your pharmacy. 

## 2019-07-06 NOTE — Progress Notes (Signed)
PCP: Dettinger, [name], MD Primary Cardiologist: Dr Bronson Ing Primary EP: Dr Rayann Heman  Anthony Kelly is a 74 y.o. male who presents today for routine electrophysiology followup.  Since last being seen in our clinic, the patient reports doing very well.  He is pleased with flecainide.  Palpitations are resolved.  Today, he denies symptoms of palpitations, chest pain, shortness of breath,  lower extremity edema, dizziness, presyncope, or syncope.  The patient is otherwise without complaint today.   Past Medical History:  Diagnosis Date  . Acute pericarditis    a. 04/2013 -adm with CP, elevated CRP. H/o coronary artery calcification on prior CT but nuc was negative, EF 71%.  . Arthritis   . Atrial fibrillation ([place])    a. Isolated episode in the setting of acute pericarditis 04/2013. Was not placed on anticoag.  Marland Kitchen BPH (benign prostatic hyperplasia)   . Cataract   . Diverticulitis 08/16/2013  . Hyperlipidemia    elevated triglycerides  . 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 Pain in joint, pelvic region and thigh 08/01/2013  . PSVT (paroxysmal supraventricular tachycardia) ([place])    Past Surgical History:  Procedure Laterality Date  . CHOLECYSTECTOMY    . EXPLORATORY LAPAROTOMY    . EYE SURGERY Right 2016   cataract  . LEFT HEART CATH AND CORONARY ANGIOGRAPHY N/A 06/05/2019   Procedure: LEFT HEART CATH AND CORONARY ANGIOGRAPHY;  Surgeon: Jettie Booze, MD;  Location: [place] [facility];  Service: Cardiovascular;  Laterality: N/A;  . LUNG LOBECTOMY     right side  . LYMPH NODE BIOPSY     in groin with  removal  . MENISCUS REPAIR     right knee  . PROSTATE SURGERY    . REFRACTIVE SURGERY Right    piece of metal removed  . SUBMANDIBULAR GLAND EXCISION  04/2011  . SUBMANDIBULAR GLAND EXCISION  04/30/2011   Procedure: EXCISION SUBMANDIBULAR GLAND;  Surgeon: Jerrell Belfast, MD;  Location: [facility];  Service: ENT;  Laterality: Left;  WITH DIAGNOSTIC BIOPSY  . TONSILLECTOMY     as a child  . VEIN LIGATION AND STRIPPING     right leg    ROS- all systems are reviewed and negatives except as per HPI above  Current Outpatient Medications  Medication Sig Dispense Refill  . acyclovir (ZOVIRAX) 400 MG tablet Take 1 tablet (400 mg total) by mouth 2 (two) times daily. 180 tablet 6  . apixaban (ELIQUIS) 5 MG TABS tablet Take 1 tablet (5 mg total) by mouth 2 (two) times daily. 60 tablet 1  . brimonidine (ALPHAGAN) 0.2 % ophthalmic solution Place 1 drop into both eyes 2 (two) times daily.     . flecainide (TAMBOCOR) 50 MG tablet Take 1 tablet (50 mg total) by mouth 2 (two) times daily. 60 tablet 3  . idelalisib (ZYDELIG) 150 MG tablet Take 1 tablet (150 mg total) by mouth 2 (two) times daily. 60 tablet 11  . LUMIGAN 0.01 % SOLN Place 1 drop into both eyes daily.    . metoprolol succinate (TOPROL XL) 25 MG 24 hr tablet Take 1  tablet (25 mg total) by mouth daily. 90 tablet 1  . Multiple Vitamin (MULTIVITAMIN WITH MINERALS) TABS tablet Take 1 tablet by mouth daily.     . NON FORMULARY Take 4 oz by mouth daily. Beet Root     . omeprazole (PRILOSEC) 20 MG capsule Take 1 capsule (20 mg total) by mouth daily. (Patient taking differently: Take 20 mg by mouth daily as needed (heartburn). ) 90 capsule 3  . ondansetron (ZOFRAN) 8 MG tablet Take 1 tablet (8 mg total) by mouth every 8 (eight) hours as needed for nausea. 30 tablet 3  . Probiotic Product (PROBIOTIC DAILY PO) Take 1 capsule by mouth daily.     . prochlorperazine (COMPAZINE) 10 MG tablet Take 1 tablet (10 mg total) by mouth every 6 (six) hours as needed  for nausea or vomiting. 30 tablet 0  . rosuvastatin (CRESTOR) 20 MG tablet Take 1 tablet (20 mg total) by mouth daily. (Patient taking differently: Take 20 mg by mouth at bedtime. ) 90 tablet 3  . terazosin (HYTRIN) 2 MG capsule Take 2 mg by mouth at bedtime.     Current Facility-Administered Medications  Medication Dose Route Frequency Provider Last Rate Last Admin  . sodium chloride flush (NS) 0.9 % injection 3 mL  3 mL Intravenous Q12H [name], MD        Physical Exam: Vitals:   07/06/19 1252  BP: 100/64  Pulse: 64  SpO2: 98%  Weight: 193 lb (87.5 kg)  Height: 6' (1.829 m)    GEN- The patient is well appearing, alert and oriented x 3 today.   Head- normocephalic, atraumatic Eyes-  Sclera clear, conjunctiva pink Ears- hearing intact Oropharynx- clear Lungs-  normal work of breathing Heart- Regular rate and rhythm  GI- soft  Extremities- no clubbing, cyanosis, or edema  Wt Readings from Last 3 Encounters:  07/06/19 193 lb (87.5 kg)  06/20/19 185 lb (83.9 kg)  06/05/19 182 lb (82.6 kg)    EKG tracing ordered today is personally reviewed and shows sinus, normal intervals  Assessment and Plan:  1. Palpitations (PACs, PVCs, afib, NSVT) Resolved with flecainide 50mg  bid Continue current medicines chads2vasc score is 1.  Will be 1 with next birthday.  Continue on eliquis We will need to follow closely on flecainide to avoid toxicity  2. HL Continue statin  3. Low BP Adequate hydration encouraged We discussed at length  Risks, benefits and potential toxicities for medications prescribed and/or refilled reviewed with patient today.   Return in 3 months to see me  Thompson Grayer MD, [facility] 07/06/2019 1:26 PM

## 2019-07-11 ENCOUNTER — Telehealth: Payer: Medicare Other | Admitting: Cardiovascular Disease

## 2019-07-16 MED FILL — ZYDELIG 150 MG TABLET: 150 | 30 days supply | Qty: 60 | Fill #5

## 2019-07-21 ENCOUNTER — Encounter: Payer: Self-pay | Admitting: Hematology and Oncology

## 2019-07-24 ENCOUNTER — Other Ambulatory Visit: Payer: Self-pay | Admitting: Hematology and Oncology

## 2019-07-24 MED ORDER — ONDANSETRON HCL 4 MG PO TABS
4.0000 mg | ORAL_TABLET | Freq: Three times a day (TID) | ORAL | 3 refills | Status: DC | PRN
Start: 2019-07-24 — End: 2019-07-30

## 2019-07-25 ENCOUNTER — Encounter: Payer: Self-pay | Admitting: Hematology and Oncology

## 2019-07-30 ENCOUNTER — Telehealth: Payer: Self-pay

## 2019-07-30 ENCOUNTER — Other Ambulatory Visit: Payer: Self-pay

## 2019-07-30 MED ORDER — PROCHLORPERAZINE MALEATE 10 MG PO TABS: 10.0000 mg | ORAL_TABLET | Freq: Four times a day (QID) | ORAL | 2 refills | Status: DC | PRN

## 2019-07-30 NOTE — Telephone Encounter (Signed)
Called and spoke with wife. Drug interaction with Zofran and Felcainide Rx. Instructed to stop Zofran and no longer take. Needs refill on Compazine Rx. [facility]

## 2019-08-09 MED FILL — ZYDELIG 150 MG TABLET: 150 | 30 days supply | Qty: 60 | Fill #6

## 2019-08-17 ENCOUNTER — Telehealth: Payer: Self-pay | Admitting: Hematology and Oncology

## 2019-08-17 ENCOUNTER — Encounter: Payer: Self-pay | Admitting: Hematology and Oncology

## 2019-08-17 ENCOUNTER — Inpatient Hospital Stay: Payer: Medicare Other

## 2019-08-17 ENCOUNTER — Other Ambulatory Visit: Payer: Self-pay

## 2019-08-17 ENCOUNTER — Inpatient Hospital Stay: Payer: Medicare Other | Attending: Hematology and Oncology | Admitting: Hematology and Oncology

## 2019-08-17 VITALS — BP 125/65 | HR 68 | Temp 97.7°F | Resp 18 | Ht 72.0 in | Wt 192.0 lb

## 2019-08-17 DIAGNOSIS — C8295 Follicular lymphoma, unspecified, lymph nodes of inguinal region and lower limb: Secondary | ICD-10-CM | POA: Diagnosis not present

## 2019-08-17 DIAGNOSIS — C8280 Other types of follicular lymphoma, unspecified site: Secondary | ICD-10-CM

## 2019-08-17 DIAGNOSIS — Z79899 Other long term (current) drug therapy: Secondary | ICD-10-CM | POA: Diagnosis not present

## 2019-08-17 DIAGNOSIS — Z7901 Long term (current) use of anticoagulants: Secondary | ICD-10-CM | POA: Diagnosis not present

## 2019-08-17 DIAGNOSIS — I2510 Atherosclerotic heart disease of native coronary artery without angina pectoris: Secondary | ICD-10-CM | POA: Diagnosis not present

## 2019-08-17 DIAGNOSIS — G893 Neoplasm related pain (acute) (chronic): Secondary | ICD-10-CM | POA: Insufficient documentation

## 2019-08-17 DIAGNOSIS — Z9221 Personal history of antineoplastic chemotherapy: Secondary | ICD-10-CM | POA: Diagnosis not present

## 2019-08-17 LAB — COMPREHENSIVE METABOLIC PANEL
ALT: 20 U/L (ref 0–44)
AST: 20 U/L (ref 15–41)
Albumin: 4.1 g/dL (ref 3.5–5.0)
Alkaline Phosphatase: 68 U/L (ref 38–126)
Anion gap: 9 (ref 5–15)
BUN: 12 mg/dL (ref 8–23)
CO2: 25 mmol/L (ref 22–32)
Calcium: 9.7 mg/dL (ref 8.9–10.3)
Chloride: 105 mmol/L (ref 98–111)
Creatinine, Ser: 1.14 mg/dL (ref 0.61–1.24)
GFR calc Af Amer: >60 mL/min (ref >60)
GFR calc non Af Amer: >60 mL/min (ref >60)
Glucose, Bld: 118 mg/dL — ABNORMAL HIGH (ref 70–99)
Potassium: 4.3 mmol/L (ref 3.5–5.1)
Sodium: 139 mmol/L (ref 135–145)
Total Bilirubin: 0.6 mg/dL (ref 0.3–1.2)
Total Protein: 6.8 g/dL (ref 6.5–8.1)

## 2019-08-17 LAB — CBC WITH DIFFERENTIAL/PLATELET
Abs Immature Granulocytes: 0.02 10*3/uL (ref 0.00–0.07)
Basophils Absolute: 0.1 10*3/uL (ref 0.0–0.1)
Basophils Relative: 1 %
Eosinophils Absolute: 1.2 10*3/uL — ABNORMAL HIGH (ref 0.0–0.5)
Eosinophils Relative: 16 %
HCT: 48.7 % (ref 39.0–52.0)
Hemoglobin: 16.2 g/dL (ref 13.0–17.0)
Immature Granulocytes: 0 %
Lymphocytes Relative: 18 %
Lymphs Abs: 1.4 10*3/uL (ref 0.7–4.0)
MCH: 29.6 pg (ref 26.0–34.0)
MCHC: 33.3 g/dL (ref 30.0–36.0)
MCV: 89 fL (ref 80.0–100.0)
Monocytes Absolute: 0.6 10*3/uL (ref 0.1–1.0)
Monocytes Relative: 8 %
Neutro Abs: 4.2 10*3/uL (ref 1.7–7.7)
Neutrophils Relative %: 57 %
Platelets: 161 10*3/uL (ref 150–400)
RBC: 5.47 MIL/uL (ref 4.22–5.81)
RDW: 12.8 % (ref 11.5–15.5)
WBC: 7.5 10*3/uL (ref 4.0–10.5)
nRBC: 0 % (ref 0.0–0.2)

## 2019-08-17 NOTE — Telephone Encounter (Signed)
Scheduled appts per 8/6 sch msg. Pt declined print out of AVS and stated he would refer to mychart.

## 2019-08-17 NOTE — Progress Notes (Signed)
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 CT CHEST ABDOMEN PELVIS W CONTRAST    Standing Status:   Future    Standing Expiration Date:   08/16/2020    Order Specific Question:   Reason for Exam (SYMPTOM  OR DIAGNOSIS REQUIRED)    Answer:   lymphoma on chemo    Order Specific Question:   Preferred imaging location?    Answer:   [facility]    Order Specific Question:   Radiology Contrast Protocol - do NOT remove file path    Answer:   [email or web address]    All questions were answered. The patient knows to call the clinic with any problems, [name], [name], MD 08/17/2019 1:10 PM  INTERVAL HISTORY: Please see below for problem oriented  charting. 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   12/12/2003 - 06/01/2004 Chemotherapy   He was treated with R. CHOP chemotherapy which show complete remission. The number of cycles of R. CHOP chemotherapy was unknown.   12/19/2006 Surgery   Lung resection show follicular lymphoma.   01/02/2007 - 09/01/2008 Chemotherapy   The patient was treated with single agent rituximab alone.   01/19/2007 Bone Marrow Biopsy   Bone marrow biopsy was negative.   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   05/08/2013 - 05/11/2013 Hospital Admission   The patient was admitted to the hospital for management of pericarditis. CT scan showed extensive lymphadenopathy.   06/07/2013 Imaging   PET/CT scan showed extensive lymphadenopathy   06/25/2013 Procedure   He has placement of Infuse-a-Port.   06/28/2013 Bone Marrow Biopsy   Bone marrow biopsy is positive for lymphoma involvement.   07/04/2013 - 11/22/2013 Chemotherapy   He is treated with 6 cycles of bendamustine with rituximab.   09/24/2013 Imaging   Repeat PET scan show complete remission.  12/26/2013 Imaging   PEt scan showed complete remission   09/26/2014 Imaging   CT scan of the chest abdomen and pelvis show no evidence of disease   03/26/2015 Imaging   CT scan showed no evidence of lymphoma   04/14/2016 Imaging   CT: Borderline prominent right hilar and subcarinal lymph nodes, but not appreciably  changed. 2. Low-grade but increased central mesenteric stranding with some small mesenteric lymph nodes. This could certainly be inflammatory, 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, [name], volume estimated at 75 cubic cm.   02/15/2017 Imaging   No evidence of recurrent lymphoma or other acute findings.  Stable moderate hiatal hernia.  Colonic diverticulosis, without radiographic evidence of diverticulitis.  Stable mildly enlarged prostate.  Mild emphysema.  Aortic and coronary artery atherosclerosis.   01/31/2019 Imaging   1. Interval development of abdominal and pelvic adenopathy compatible with recurrent lymphoma. 2. Emphysema and aortic atherosclerosis. 3. Multi vessel coronary artery calcifications. 4. Moderate to large hiatal hernia   02/09/2019 Procedure   Successful CT-guided core biopsy of the left retroperitoneal periaortic adenopathy   02/09/2019 Pathology Results   SURGICAL PATHOLOGY  CASE: [id number]  PATIENT: Anthony Kelly  Surgical Pathology Report   Clinical History: Lymphoma; [name], LEFT PARA AORTIC, [name], [name], [name], [name], [name], [name] is low (less than 10%).  Flow cytometry was attempted; however, there was insufficient material for analysis (see [id number]).  Overall, the features are consistent with relapse of the patient's previously diagnosed follicular lymphoma.  Based on the biopsy, this is favored to be a low-grade follicular lymphoma   04/11/9377 -  Chemotherapy   The patient had idelisib for chemotherapy treatment.     05/17/2019 Imaging   1. Interval generalized decrease in abdominopelvic lymphadenopathy identified on the previous study. No new or progressive lymphadenopathy on today's study. 2. Moderate hiatal hernia. 3.  Emphysema (ICD10-J43.9) and Aortic Atherosclerosis (ICD10-170.0)     REVIEW OF SYSTEMS:   Constitutional: Denies fevers, chills or abnormal weight loss Eyes: Denies blurriness of vision Ears, nose, mouth, throat, and face: Denies mucositis or sore throat Respiratory: Denies cough, dyspnea or wheezes Cardiovascular: Denies palpitation, chest discomfort or lower extremity swelling Gastrointestinal:  Denies nausea, heartburn or change in bowel habits Skin: Denies abnormal skin rashes Lymphatics: Denies new lymphadenopathy or easy bruising Neurological:Denies numbness, tingling or new weaknesses Behavioral/Psych: Mood is stable, no new changes  All other systems were reviewed with the patient and are negative.  I have reviewed the past medical history, past surgical history,  social history and family history with the patient and they are unchanged from previous note.  ALLERGIES:  has No Known Allergies.  MEDICATIONS:  Current Outpatient Medications  Medication Sig Dispense Refill  . acyclovir (ZOVIRAX) 400 MG tablet Take 1  tablet (400 mg total) by mouth 2 (two) times daily. 180 tablet 6  . apixaban (ELIQUIS) 5 MG TABS tablet Take 1 tablet (5 mg total) by mouth 2 (two) times daily. 60 tablet 1  . brimonidine (ALPHAGAN) 0.2 % ophthalmic solution Place 1 drop into both eyes 2 (two) times daily.     . flecainide (TAMBOCOR) 50 MG tablet Take 1 tablet (50 mg total) by mouth 2 (two) times daily. 180 tablet 3  . idelalisib (ZYDELIG) 150 MG tablet Take 1 tablet (150 mg total) by mouth 2 (two) times daily. 60 tablet 11  . LUMIGAN 0.01 % SOLN Place 1 drop into both eyes daily.    . metoprolol succinate (TOPROL XL) 25 MG 24 hr tablet Take 1 tablet (25 mg total) by mouth daily. 90 tablet 1  . Multiple Vitamin (MULTIVITAMIN WITH MINERALS) TABS tablet Take 1 tablet by mouth daily.     . NON FORMULARY Take 4 oz by mouth daily. Beet Root     . omeprazole (PRILOSEC) 20 MG capsule Take 1 capsule (20 mg total) by mouth daily. (Patient taking differently: Take 20 mg by mouth daily as needed (heartburn). ) 90 capsule 3  . Probiotic Product (PROBIOTIC DAILY PO) Take 1 capsule by mouth daily.     . prochlorperazine (COMPAZINE) 10 MG tablet Take 1 tablet (10 mg total) by mouth every 6 (six) hours as needed for nausea or vomiting. 30 tablet 2  . rosuvastatin (CRESTOR) 20 MG tablet Take 1 tablet (20 mg total) by mouth daily. (Patient taking differently: Take 20 mg by mouth at bedtime. ) 90 tablet 3  . terazosin (HYTRIN) 2 MG capsule Take 2 mg by mouth at bedtime.     Current Facility-Administered Medications  Medication Dose Route Frequency Provider Last Rate Last Admin  . sodium chloride flush (NS) 0.9 % injection 3 mL  3 mL Intravenous Q12H [name], MD        PHYSICAL EXAMINATION: ECOG PERFORMANCE STATUS: 0 - Asymptomatic  Vitals:   08/17/19 0920  BP: 125/65  Pulse: 68  Resp: 18  Temp: 97.7 F (36.5 C)   Filed Weights   08/17/19 0920  Weight: 192 lb (87.1 kg)    GENERAL:alert, no distress and comfortable SKIN:  skin color, texture, turgor are normal, no rashes or significant lesions EYES: normal, Conjunctiva are pink and non-injected, sclera clear OROPHARYNX:no exudate, no erythema and lips, buccal mucosa, and tongue normal  NECK: supple, thyroid normal size, non-tender, without nodularity LYMPH:  no palpable lymphadenopathy in the cervical, axillary or inguinal LUNGS: clear to auscultation and percussion with normal breathing effort HEART: regular rate & rhythm and no murmurs and no lower extremity edema ABDOMEN:abdomen soft, non-tender and normal bowel sounds Musculoskeletal:no cyanosis of digits and no clubbing  NEURO: alert & oriented x 3 with fluent speech, no focal motor/sensory deficits  LABORATORY DATA:  I have reviewed the data as listed    Component Value Date/Time   NA 139 08/17/2019 0858   NA 142 03/22/2019 0759   NA 139 04/14/2016 0943   K 4.3 08/17/2019 0858   K 4.6 04/14/2016 0943   CL 105 08/17/2019 0858   CL 104 03/24/2012 1024   CO2 25 08/17/2019 0858  CO2 25 04/14/2016 0943   GLUCOSE 118 (H) 08/17/2019 0858   GLUCOSE 111 04/14/2016 0943   GLUCOSE 80 03/24/2012 1024   BUN 12 08/17/2019 0858   BUN 15 03/22/2019 0759   BUN 8.6 04/14/2016 0943   CREATININE 1.14 08/17/2019 0858   CREATININE 1.1 04/14/2016 0943   CALCIUM 9.7 08/17/2019 0858   CALCIUM 9.4 04/14/2016 0943   PROT 6.8 08/17/2019 0858   PROT 6.6 03/22/2019 0759   PROT 6.5 04/14/2016 0943   ALBUMIN 4.1 08/17/2019 0858   ALBUMIN 4.6 03/22/2019 0759   ALBUMIN 3.8 04/14/2016 0943   AST 20 08/17/2019 0858   AST 28 04/14/2016 0943   ALT 20 08/17/2019 0858   ALT 41 04/14/2016 0943   ALKPHOS 68 08/17/2019 0858   ALKPHOS 71 04/14/2016 0943   BILITOT 0.6 08/17/2019 0858   BILITOT 0.4 03/22/2019 0759   BILITOT 0.61 04/14/2016 0943   GFRNONAA >60 08/17/2019 0858   GFRAA >60 08/17/2019 0858    No results found for: SPEP, UPEP  Lab Results  Component Value Date   WBC 7.5 08/17/2019   NEUTROABS 4.2  08/17/2019   HGB 16.2 08/17/2019   HCT 48.7 08/17/2019   MCV 89.0 08/17/2019   PLT 161 08/17/2019      Chemistry      Component Value Date/Time   NA 139 08/17/2019 0858   NA 142 03/22/2019 0759   NA 139 04/14/2016 0943   K 4.3 08/17/2019 0858   K 4.6 04/14/2016 0943   CL 105 08/17/2019 0858   CL 104 03/24/2012 1024   CO2 25 08/17/2019 0858   CO2 25 04/14/2016 0943   BUN 12 08/17/2019 0858   BUN 15 03/22/2019 0759   BUN 8.6 04/14/2016 0943   CREATININE 1.14 08/17/2019 0858   CREATININE 1.1 04/14/2016 0943      Component Value Date/Time   CALCIUM 9.7 08/17/2019 0858   CALCIUM 9.4 04/14/2016 0943   ALKPHOS 68 08/17/2019 0858   ALKPHOS 71 04/14/2016 0943   AST 20 08/17/2019 0858   AST 28 04/14/2016 0943   ALT 20 08/17/2019 0858   ALT 41 04/14/2016 0943   BILITOT 0.6 08/17/2019 0858   BILITOT 0.4 03/22/2019 0759   BILITOT 0.61 04/14/2016 8403

## 2019-08-17 NOTE — Assessment & Plan Note (Signed)
The patient have cardiovascular risk factors He will continue close follow-up with cardiologist and medical management

## 2019-08-17 NOTE — Assessment & Plan Note (Signed)
He has rare intermittent abdominal pain that comes and goes He has resolved He has pain medicine to take as needed

## 2019-08-17 NOTE — Assessment & Plan Note (Signed)
He tolerated chemotherapy very well except from occasional nausea We will continue treatment indefinitely I plan to repeat blood work and imaging study in 3 months He is in agreement

## 2019-08-21 ENCOUNTER — Ambulatory Visit: Payer: Medicare Other | Admitting: Cardiovascular Disease

## 2019-08-21 DIAGNOSIS — D692 Other nonthrombocytopenic purpura: Secondary | ICD-10-CM | POA: Diagnosis not present

## 2019-08-21 DIAGNOSIS — L570 Actinic keratosis: Secondary | ICD-10-CM | POA: Diagnosis not present

## 2019-08-21 DIAGNOSIS — D1801 Hemangioma of skin and subcutaneous tissue: Secondary | ICD-10-CM | POA: Diagnosis not present

## 2019-08-21 DIAGNOSIS — L905 Scar conditions and fibrosis of skin: Secondary | ICD-10-CM | POA: Diagnosis not present

## 2019-08-21 DIAGNOSIS — L821 Other seborrheic keratosis: Secondary | ICD-10-CM | POA: Diagnosis not present

## 2019-08-21 DIAGNOSIS — D225 Melanocytic nevi of trunk: Secondary | ICD-10-CM | POA: Diagnosis not present

## 2019-08-27 ENCOUNTER — Ambulatory Visit: Payer: Medicare Other | Admitting: Hematology and Oncology

## 2019-08-28 ENCOUNTER — Ambulatory Visit (INDEPENDENT_AMBULATORY_CARE_PROVIDER_SITE_OTHER): Payer: Medicare Other | Admitting: Cardiology

## 2019-08-28 ENCOUNTER — Encounter: Payer: Self-pay | Admitting: Cardiology

## 2019-08-28 VITALS — BP 118/64 | HR 74 | Ht 72.0 in | Wt 195.0 lb

## 2019-08-28 DIAGNOSIS — I2510 Atherosclerotic heart disease of native coronary artery without angina pectoris: Secondary | ICD-10-CM | POA: Diagnosis not present

## 2019-08-28 DIAGNOSIS — I4729 Other ventricular tachycardia: Secondary | ICD-10-CM

## 2019-08-28 DIAGNOSIS — I2583 Coronary atherosclerosis due to lipid rich plaque: Secondary | ICD-10-CM

## 2019-08-28 DIAGNOSIS — I472 Ventricular tachycardia: Secondary | ICD-10-CM

## 2019-08-28 DIAGNOSIS — I480 Paroxysmal atrial fibrillation: Secondary | ICD-10-CM | POA: Diagnosis not present

## 2019-08-28 NOTE — Progress Notes (Signed)
Clinical Summary Mr. Dunwoody is a 74 y.o.male seen today for follow up of the following medical problems. Last seen by Dr Bronson Ing, this is our first visit together.   1. PAF - followed by EP, started on flecanide - no recent palpitaitons - no bleeding on eliquis   2. CAD - Coronary CT angiography demonstrated nonobstructive coronary artery disease in the LAD, left circumflex, and RCA. - 05/2019 cath: prox LAD 25%, ramus 50%, RCA 25% - 03/2019 TC 137 TG 75 HDL 61 LDL 61  3. NSVT - no recent symptoms.   4. Lymphoma - followed by oncology  SH: has had covid vaccine x 2.    Past Medical History:  Diagnosis Date  . Acute pericarditis    a. 04/2013 -adm with CP, elevated CRP. H/o coronary artery calcification on prior CT but nuc was negative, EF 71%.  . Arthritis   . Atrial fibrillation ([place])    a. Isolated episode in the setting of acute pericarditis 04/2013. Was not placed on anticoag.  Marland Kitchen BPH (benign prostatic hyperplasia)   . Cataract   . Diverticulitis 08/16/2013  . Hyperlipidemia    elevated triglycerides  . Low grade B-cell lymphoma ([place]) 05/11/2011   Initial dx 6/04 left inguinal adenopathy Rx observation; convert to hi grade 11/05 Rx CHOP-R; lesion right lung resected 12/08: low grade NHL; new lesion left submandibular gland 2/13  resected 04/30/11 lo grade NHL  . Malignant lymphoma, high grade ([place]) 03/14/2011  . 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 Pain in joint, pelvic region and thigh 08/01/2013  . PSVT (paroxysmal supraventricular tachycardia) ([facility])      No Known Allergies   Current Outpatient Medications  Medication Sig Dispense Refill  . acyclovir (ZOVIRAX) 400 MG tablet Take 1 tablet (400 mg total) by mouth 2 (two) times daily. 180 tablet 6  . apixaban (ELIQUIS) 5 MG TABS tablet Take 1 tablet (5 mg total) by mouth 2 (two) times daily. 60 tablet 1  . brimonidine (ALPHAGAN) 0.2 %  ophthalmic solution Place 1 drop into both eyes 2 (two) times daily.     . flecainide (TAMBOCOR) 50 MG tablet Take 1 tablet (50 mg total) by mouth 2 (two) times daily. 180 tablet 3  . idelalisib (ZYDELIG) 150 MG tablet Take 1 tablet (150 mg total) by mouth 2 (two) times daily. 60 tablet 11  . LUMIGAN 0.01 % SOLN Place 1 drop into both eyes daily.    . metoprolol succinate (TOPROL XL) 25 MG 24 hr tablet Take 1 tablet (25 mg total) by mouth daily. 90 tablet 1  . Multiple Vitamin (MULTIVITAMIN WITH MINERALS) TABS tablet Take 1 tablet by mouth daily.     . NON FORMULARY Take 4 oz by mouth daily. Beet Root     . omeprazole (PRILOSEC) 20 MG capsule Take 1 capsule (20 mg total) by mouth daily. (Patient taking differently: Take 20 mg by mouth daily as needed (heartburn). ) 90 capsule 3  . Probiotic Product (PROBIOTIC DAILY PO) Take 1 capsule by mouth daily.     . prochlorperazine (COMPAZINE) 10 MG tablet Take 1 tablet (10 mg total) by mouth every 6 (six) hours as needed for nausea or vomiting. 30 tablet 2  . rosuvastatin (CRESTOR) 20 MG tablet Take 1 tablet (20 mg total) by mouth daily. (Patient taking differently: Take 20 mg by mouth at bedtime. ) 90 tablet 3  .  terazosin (HYTRIN) 2 MG capsule Take 2 mg by mouth at bedtime.     Current Facility-Administered Medications  Medication Dose Route Frequency Provider Last Rate Last Admin  . sodium chloride flush (NS) 0.9 % injection 3 mL  3 mL Intravenous Q12H [name], MD         Past Surgical History:  Procedure Laterality Date  . CHOLECYSTECTOMY    . EXPLORATORY LAPAROTOMY    . EYE SURGERY Right 2016   cataract  . LEFT HEART CATH AND CORONARY ANGIOGRAPHY N/A 06/05/2019   Procedure: LEFT HEART CATH AND CORONARY ANGIOGRAPHY;  Surgeon: Jettie Booze, MD;  Location: [facility];  Service: Cardiovascular;  Laterality: N/A;  . LUNG LOBECTOMY     right side  . LYMPH NODE BIOPSY     in groin with removal  . MENISCUS REPAIR      right knee  . PROSTATE SURGERY    . REFRACTIVE SURGERY Right    piece of metal removed  . SUBMANDIBULAR GLAND EXCISION  04/2011  . SUBMANDIBULAR GLAND EXCISION  04/30/2011   Procedure: EXCISION SUBMANDIBULAR GLAND;  Surgeon: Jerrell Belfast, MD;  Location: [facility];  Service: ENT;  Laterality: Left;  WITH DIAGNOSTIC BIOPSY  . TONSILLECTOMY     as a child  . VEIN LIGATION AND STRIPPING     right leg     No Known Allergies    Family History  Problem Relation Age of Onset  . Heart disease Father   . Heart attack Father        x 3  . Cancer Sister        breast ca  . Cancer Brother        prostate ca  . Heart attack Sister   . Cancer Sister        breast  . Heart disease Brother   . Alzheimer's disease Sister   . Diabetes Sister   . Heart disease Brother   . Diabetes Brother   . Heart disease Brother   . Heart disease Brother   . Heart disease Brother   . Heart disease Brother   . Heart disease Brother   . Alzheimer's disease Brother   . Diabetes Son   . Anesthesia problems Neg Hx      Social History Mr. Dax reports that he quit smoking about 29 years ago. His smoking use included cigarettes. He started smoking about 56 years ago. He has a 37.50 pack-year smoking history. He has never used smokeless tobacco. Mr. Wenke reports no history of alcohol use.   Review of Systems CONSTITUTIONAL: No weight loss, fever, chills, weakness or fatigue.  HEENT: Eyes: No visual loss, blurred vision, double vision or yellow sclerae.No hearing loss, sneezing, congestion, runny nose or sore throat.  SKIN: No rash or itching.  CARDIOVASCULAR: per hpi RESPIRATORY: No shortness of breath, cough or sputum.  GASTROINTESTINAL: No anorexia, nausea, vomiting or diarrhea. No abdominal pain or blood.  GENITOURINARY: No burning on urination, no polyuria NEUROLOGICAL: No headache, dizziness, syncope, paralysis, ataxia, numbness or tingling in the extremities. No change in bowel or bladder  control.  MUSCULOSKELETAL: No muscle, back pain, joint pain or stiffness.  LYMPHATICS: No enlarged nodes. No history of splenectomy.  PSYCHIATRIC: No history of depression or anxiety.  ENDOCRINOLOGIC: No reports of sweating, cold or heat intolerance. No polyuria or polydipsia.  Marland Kitchen   Physical Examination Today's Vitals   08/28/19 0831  BP: 118/64  Pulse: 74  SpO2: 96%  Weight: 195 lb (88.5 kg)  Height: 6' (1.829 m)   Body mass index is 26.45 kg/m.  Gen: resting comfortably, no acute distress HEENT: no scleral icterus, pupils equal round and reactive, no palptable cervical adenopathy,  CV: RRR, no m/r/g, no jvd Resp: Clear to auscultation bilaterally GI: abdomen is soft, non-tender, non-distended, normal bowel sounds, no hepatosplenomegaly MSK: extremities are warm, no edema.  Skin: warm, no rash Neuro:  no focal deficits Psych: appropriate affect   Diagnostic Studies   Coronary CT angiography 04/27/2019:  1. Left Main: No significant stenosis.  2. LAD: No significant stenosis. FFR 0.9 proximal, 0.84 distal 3. LCX: No significant stenosis. FFR 0.9 in the mid vessel, 0.84 distally 4. RCA: No significant stenosis. FFR 0.9 proximally, 0.87 mid, 0.80 distally  IMPRESSION: 1. CT FFR analysis didn't show any significant stenosis.   Echocardiogram 05/22/2019:  1. Left ventricular ejection fraction, by estimation, is 60 to 65%. The  left ventricle has normal function. The left ventricle has no regional  wall motion abnormalities. There is mild left ventricular hypertrophy.  Left ventricular diastolic parameters  are indeterminate.  2. Right ventricular systolic function is normal. The right ventricular  size is mildly enlarged. Mildly increased right ventricular wall  thickness. Tricuspid regurgitation signal is inadequate for assessing PA  pressure.  3. Left atrial size was mildly dilated.  4. The mitral valve is grossly normal. Trivial mitral valve    regurgitation.  5. The aortic valve is tricuspid. Aortic valve regurgitation is not  visualized.  6. The inferior vena cava is normal in size with greater than 50%  respiratory variability, suggesting right atrial pressure of 3 mmHg.    Assessment and Plan   1. PAF - doing very well on flecanide and beta blocker - continue current meds including anticoag  2. CAD - mild disease by cath, no symptoms - continue risk factor modification  3. NSVT - no symptoms, continue beta blocker     Arnoldo Lenis, M.D.

## 2019-08-28 NOTE — Patient Instructions (Signed)

## 2019-09-13 MED FILL — ZYDELIG 150 MG TABLET: 150 | 30 days supply | Qty: 60 | Fill #7

## 2019-09-14 ENCOUNTER — Ambulatory Visit (INDEPENDENT_AMBULATORY_CARE_PROVIDER_SITE_OTHER): Payer: Medicare Other | Admitting: Internal Medicine

## 2019-09-14 ENCOUNTER — Other Ambulatory Visit: Payer: Self-pay

## 2019-09-14 VITALS — BP 118/64 | HR 66 | Ht 72.0 in | Wt 197.0 lb

## 2019-09-14 DIAGNOSIS — E785 Hyperlipidemia, unspecified: Secondary | ICD-10-CM | POA: Diagnosis not present

## 2019-09-14 DIAGNOSIS — R002 Palpitations: Secondary | ICD-10-CM

## 2019-09-14 DIAGNOSIS — I2510 Atherosclerotic heart disease of native coronary artery without angina pectoris: Secondary | ICD-10-CM | POA: Diagnosis not present

## 2019-09-14 DIAGNOSIS — I480 Paroxysmal atrial fibrillation: Secondary | ICD-10-CM

## 2019-09-14 NOTE — Progress Notes (Signed)
PCP: Dettinger, [name], MD Primary Cardiologist: Dr Harl Bowie Primary EP: Dr Rayann Heman  Anthony Kelly is a 74 y.o. male who presents today for routine electrophysiology followup.  Since last being seen in our clinic, the patient reports doing very well.  Today, he denies symptoms of palpitations, chest pain, shortness of breath,  lower extremity edema, dizziness, presyncope, or syncope.  The patient is otherwise without complaint today.   Past Medical History:  Diagnosis Date  . Acute pericarditis    a. 04/2013 -adm with CP, elevated CRP. H/o coronary artery calcification on prior CT but nuc was negative, EF 71%.  . Arthritis   . Atrial fibrillation ([place])    a. Isolated episode in the setting of acute pericarditis 04/2013. Was not placed on anticoag.  Marland Kitchen BPH (benign prostatic hyperplasia)   . Cataract   . Diverticulitis 08/16/2013  . Hyperlipidemia    elevated triglycerides  . 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 Pain in joint, pelvic region and thigh 08/01/2013  . PSVT (paroxysmal supraventricular tachycardia) ([place])    Past Surgical History:  Procedure Laterality Date  . CHOLECYSTECTOMY    . EXPLORATORY LAPAROTOMY    . EYE SURGERY Right 2016   cataract  . LEFT HEART CATH AND CORONARY ANGIOGRAPHY N/A 06/05/2019   Procedure: LEFT HEART CATH AND CORONARY ANGIOGRAPHY;  Surgeon: Jettie Booze, MD;  Location: [facility];  Service: Cardiovascular;  Laterality: N/A;  . LUNG LOBECTOMY     right side  . LYMPH NODE BIOPSY     in groin with removal  . MENISCUS REPAIR     right knee  . PROSTATE SURGERY     . REFRACTIVE SURGERY Right    piece of metal removed  . SUBMANDIBULAR GLAND EXCISION  04/2011  . SUBMANDIBULAR GLAND EXCISION  04/30/2011   Procedure: EXCISION SUBMANDIBULAR GLAND;  Surgeon: Jerrell Belfast, MD;  Location: [facility];  Service: ENT;  Laterality: Left;  WITH DIAGNOSTIC BIOPSY  . TONSILLECTOMY     as a child  . VEIN LIGATION AND STRIPPING     right leg    ROS- all systems are reviewed and negatives except as per HPI above  Current Outpatient Medications  Medication Sig Dispense Refill  . acyclovir (ZOVIRAX) 400 MG tablet Take 1 tablet (400 mg total) by mouth 2 (two) times daily. 180 tablet 6  . apixaban (ELIQUIS) 5 MG TABS tablet Take 1 tablet (5 mg total) by mouth 2 (two) times daily. 60 tablet 1  . brimonidine (ALPHAGAN) 0.2 % ophthalmic solution Place 1 drop into both eyes 2 (two) times daily.     . flecainide (TAMBOCOR) 50 MG tablet Take 1 tablet (50 mg total) by mouth 2 (two) times daily. 180 tablet 3  . idelalisib (ZYDELIG) 150 MG tablet Take 1 tablet (150 mg total) by mouth 2 (two) times daily. 60 tablet 11  . LUMIGAN 0.01 % SOLN Place 1 drop into both eyes daily.    . metoprolol succinate (TOPROL XL) 25 MG 24 hr tablet Take 1 tablet (25 mg total) by mouth daily. 90 tablet 1  .  Multiple Vitamin (MULTIVITAMIN WITH MINERALS) TABS tablet Take 1 tablet by mouth daily.     . NON FORMULARY Take 4 oz by mouth daily. Beet Root     . omeprazole (PRILOSEC) 20 MG capsule Take 1 capsule (20 mg total) by mouth daily. (Patient taking differently: Take 20 mg by mouth daily as needed (heartburn). ) 90 capsule 3  . Probiotic Product (PROBIOTIC DAILY PO) Take 1 capsule by mouth daily.     . prochlorperazine (COMPAZINE) 10 MG tablet Take 1 tablet (10 mg total) by mouth every 6 (six) hours as needed for nausea or vomiting. 30 tablet 2  . terazosin (HYTRIN) 2 MG capsule Take 2 mg by mouth at bedtime.    . rosuvastatin (CRESTOR) 20 MG tablet Take 1 tablet (20 mg total) by mouth daily. (Patient  taking differently: Take 20 mg by mouth at bedtime. ) 90 tablet 3   Current Facility-Administered Medications  Medication Dose Route Frequency Provider Last Rate Last Admin  . sodium chloride flush (NS) 0.9 % injection 3 mL  3 mL Intravenous Q12H [name], MD        Physical Exam: Vitals:   09/14/19 0907  BP: 118/64  Pulse: 66  SpO2: 99%  Weight: 197 lb (89.4 kg)  Height: 6' (1.829 m)    GEN- The patient is well appearing, alert and oriented x 3 today.   Head- normocephalic, atraumatic Eyes-  Sclera clear, conjunctiva pink Ears- hearing intact Oropharynx- clear Lungs- Clear to ausculation bilaterally, normal work of breathing Heart- Regular rate and rhythm, no murmurs, rubs or gallops, PMI not laterally displaced GI- soft, NT, ND, + BS Extremities- no clubbing, cyanosis, or edema  Wt Readings from Last 3 Encounters:  09/14/19 197 lb (89.4 kg)  08/28/19 195 lb (88.5 kg)  08/17/19 192 lb (87.1 kg)    EKG tracing ordered today reveals sinus rhythm, normal intervals  Assessment and Plan:  1. Palpitations, PACs, PVCs, afib Doing very well with flecainide 50mg  BID chads2vas score is 1.  Will be 1 in December (birthday) Continue eliquis  2. HL Continue statin  Risks, benefits and potential toxicities for medications prescribed and/or refilled reviewed with patient today.   Return to see me in a year  Thompson Grayer MD, [facility] 09/14/2019 9:09 AM

## 2019-09-14 NOTE — Patient Instructions (Signed)
Medication Instructions:  Continue all current medications.  Labwork: none  Testing/Procedures: none  Follow-Up: [place] - 1 year   Any Other Special Instructions Will Be Listed Below (If Applicable).  If you need a refill on your cardiac medications before your next appointment, please call your pharmacy.

## 2019-09-18 NOTE — Addendum Note (Signed)
Addended by: Merlene Laughter on: 09/18/2019 03:12 PM   Modules accepted: Orders

## 2019-09-24 ENCOUNTER — Encounter: Payer: Self-pay | Admitting: Hematology and Oncology

## 2019-09-25 ENCOUNTER — Telehealth: Payer: Self-pay | Admitting: Family Medicine

## 2019-09-25 DIAGNOSIS — I480 Paroxysmal atrial fibrillation: Secondary | ICD-10-CM

## 2019-09-25 DIAGNOSIS — E782 Mixed hyperlipidemia: Secondary | ICD-10-CM

## 2019-09-25 NOTE — Telephone Encounter (Signed)
I would lower toprol to 12.5mg  daily and monitor symptoms   Zandra Abts MD

## 2019-09-25 NOTE — Telephone Encounter (Signed)
What labs would you like for patient to have done?

## 2019-09-26 ENCOUNTER — Ambulatory Visit: Payer: Medicare Other | Admitting: Family Medicine

## 2019-09-26 NOTE — Telephone Encounter (Signed)
Placed labs for patient to come in and do

## 2019-09-26 NOTE — Telephone Encounter (Signed)
Patients wife aware and verbalized understanding.

## 2019-09-27 ENCOUNTER — Telehealth: Payer: Self-pay

## 2019-09-27 ENCOUNTER — Other Ambulatory Visit: Payer: Self-pay

## 2019-09-27 ENCOUNTER — Other Ambulatory Visit: Payer: Medicare Other

## 2019-09-27 DIAGNOSIS — I480 Paroxysmal atrial fibrillation: Secondary | ICD-10-CM | POA: Diagnosis not present

## 2019-09-27 DIAGNOSIS — E782 Mixed hyperlipidemia: Secondary | ICD-10-CM | POA: Diagnosis not present

## 2019-09-27 MED ORDER — METOPROLOL SUCCINATE ER 25 MG PO TB24
12.5000 mg | ORAL_TABLET | Freq: Every day | ORAL | 1 refills | Status: DC
Start: 2019-09-27 — End: 2019-10-30

## 2019-09-27 NOTE — Telephone Encounter (Signed)
Patient called back.he will decrease Toprol to 12.5 mg daily and monitor symptoms

## 2019-09-27 NOTE — Telephone Encounter (Signed)
09/27/19 I left message for patient to call back.-cc    Note   I would lower toprol to 12.5mg  daily and monitor symptoms   Zandra Abts MD         See patient e-mail from [place] on 09/24/19    Alphonse Guild, MD  [name] "[name]" to Arnoldo Lenis, MD     1:45 PM I'm experiencing shortness of breath.more often on high-humidity days.  One day last week, my blood pressure dropped to 97/59 and my heart rate dropped to 47 bpm and stayed that way for a couple of hours.  How concerned should I be?  Dr. Alvy Bimler advised me to contact you.  I thought it could have been side effects of the chemo pill.

## 2019-09-27 NOTE — Telephone Encounter (Signed)
I see this was sent back to me, was the message about lowering the toprol forwared to the patient?   Zandra Abts MD

## 2019-09-28 ENCOUNTER — Encounter: Payer: Self-pay | Admitting: Hematology and Oncology

## 2019-09-28 LAB — CMP14+EGFR
ALT: 16 IU/L (ref 0–44)
AST: 15 IU/L (ref 0–40)
Albumin/Globulin Ratio: 2.2 (ref 1.2–2.2)
Albumin: 4.4 g/dL (ref 3.7–4.7)
Alkaline Phosphatase: 56 IU/L (ref 44–121)
BUN/Creatinine Ratio: 13 (ref 10–24)
BUN: 15 mg/dL (ref 8–27)
Bilirubin Total: 0.5 mg/dL (ref 0.0–1.2)
CO2: 24 mmol/L (ref 20–29)
Calcium: 9.2 mg/dL (ref 8.6–10.2)
Chloride: 103 mmol/L (ref 96–106)
Creatinine, Ser: 1.19 mg/dL (ref 0.76–1.27)
GFR calc Af Amer: 69 mL/min/{1.73_m2} (ref >59)
GFR calc non Af Amer: 60 mL/min/{1.73_m2} (ref >59)
Globulin, Total: 2 g/dL (ref 1.5–4.5)
Glucose: 113 mg/dL — ABNORMAL HIGH (ref 65–99)
Potassium: 4.8 mmol/L (ref 3.5–5.2)
Sodium: 140 mmol/L (ref 134–144)
Total Protein: 6.4 g/dL (ref 6.0–8.5)

## 2019-09-28 LAB — CBC WITH DIFFERENTIAL/PLATELET
Basophils Absolute: 0.1 10*3/uL (ref 0.0–0.2)
Basos: 1 %
EOS (ABSOLUTE): 1.5 10*3/uL — ABNORMAL HIGH (ref 0.0–0.4)
Eos: 18 %
Hematocrit: 46.2 % (ref 37.5–51.0)
Hemoglobin: 15.6 g/dL (ref 13.0–17.7)
Immature Grans (Abs): 0 10*3/uL (ref 0.0–0.1)
Immature Granulocytes: 0 %
Lymphocytes Absolute: 1.4 10*3/uL (ref 0.7–3.1)
Lymphs: 17 %
MCH: 30.6 pg (ref 26.6–33.0)
MCHC: 33.8 g/dL (ref 31.5–35.7)
MCV: 91 fL (ref 79–97)
Monocytes Absolute: 0.8 10*3/uL (ref 0.1–0.9)
Monocytes: 10 %
Neutrophils Absolute: 4.6 10*3/uL (ref 1.4–7.0)
Neutrophils: 54 %
Platelets: 146 10*3/uL — ABNORMAL LOW (ref 150–450)
RBC: 5.09 x10E6/uL (ref 4.14–5.80)
RDW: 14 % (ref 11.6–15.4)
WBC: 8.4 10*3/uL (ref 3.4–10.8)

## 2019-09-28 LAB — LIPID PANEL
Chol/HDL Ratio: 2.1 ratio (ref 0.0–5.0)
Cholesterol, Total: 105 mg/dL (ref 100–199)
HDL: 49 mg/dL (ref >39)
LDL Chol Calc (NIH): 36 mg/dL (ref 0–99)
Triglycerides: 106 mg/dL (ref 0–149)
VLDL Cholesterol Cal: 20 mg/dL (ref 5–40)

## 2019-10-01 ENCOUNTER — Ambulatory Visit (INDEPENDENT_AMBULATORY_CARE_PROVIDER_SITE_OTHER): Payer: Medicare Other | Admitting: Family Medicine

## 2019-10-01 ENCOUNTER — Encounter: Payer: Self-pay | Admitting: Family Medicine

## 2019-10-01 ENCOUNTER — Other Ambulatory Visit: Payer: Self-pay

## 2019-10-01 VITALS — BP 94/65 | HR 68 | Temp 98.0°F | Ht 72.0 in | Wt 194.0 lb

## 2019-10-01 DIAGNOSIS — K219 Gastro-esophageal reflux disease without esophagitis: Secondary | ICD-10-CM | POA: Diagnosis not present

## 2019-10-01 DIAGNOSIS — I480 Paroxysmal atrial fibrillation: Secondary | ICD-10-CM

## 2019-10-01 DIAGNOSIS — I2510 Atherosclerotic heart disease of native coronary artery without angina pectoris: Secondary | ICD-10-CM

## 2019-10-01 DIAGNOSIS — R7303 Prediabetes: Secondary | ICD-10-CM | POA: Diagnosis not present

## 2019-10-01 DIAGNOSIS — E782 Mixed hyperlipidemia: Secondary | ICD-10-CM | POA: Diagnosis not present

## 2019-10-01 NOTE — Progress Notes (Signed)
BP 94/65   Pulse 68   Temp 98 F (36.7 C)   Ht 6' (1.829 m)   Wt 194 lb (88 kg)   SpO2 94%   BMI 26.31 kg/m    Subjective:   Patient ID: Anthony Kelly, male    DOB: 1945/08/20, 74 y.o.   MRN: [id number]  HPI: [name] is a 74 y.o. male presenting on 10/01/2019 for Medical Management of Chronic Issues, Hyperlipidemia, and Hypertension   HPI Prediabetes Patient comes in today for recheck of his diabetes. Patient has been currently taking no medicine and we have been monitoring for diet control,. Patient is not currently on an ACE inhibitor/ARB. Patient has not seen an ophthalmologist this year. Patient denies any issues with their feet. The symptom started onset as an adult hyperlipidemia ARE RELATED TO DM   Hyperlipidemia Patient is coming in for recheck of his hyperlipidemia. The patient is currently taking Crestor. They deny any issues with myalgias or history of liver damage from it. They deny any focal numbness or weakness or chest pain.   Paroxysmal A. Fib Patient is currently on anticoagulation and flecainide and metoprolol to control heart rate and A. fib.  He has been doing well on that, he has occasional lightheadedness and dizziness when he stands up quickly but tries to get up slowly from this.  Discussed possibly talking with his cardiologist about the flecainide, we have already cut the metoprolol in half and his blood pressure still low.  Relevant past medical, surgical, family and social history reviewed and updated as indicated. Interim medical history since our last visit reviewed. Allergies and medications reviewed and updated.  Review of Systems  Constitutional: Negative for chills and fever.  Eyes: Negative for visual disturbance.  Respiratory: Negative for shortness of breath and wheezing.   Cardiovascular: Negative for chest pain and leg swelling.  Musculoskeletal: Negative for back pain and gait problem.  Skin: Negative for rash.  Neurological:  Negative for dizziness, weakness and light-headedness.  All other systems reviewed and are negative.   Per HPI unless specifically indicated above   Allergies as of 10/01/2019   No Known Allergies     Medication List       Accurate as of October 01, 2019 11:16 AM. If you have any questions, ask your nurse or doctor.        acyclovir 400 MG tablet Commonly known as: ZOVIRAX Take 1 tablet (400 mg total) by mouth 2 (two) times daily.   apixaban 5 MG Tabs tablet Commonly known as: Eliquis Take 1 tablet (5 mg total) by mouth 2 (two) times daily.   brimonidine 0.2 % ophthalmic solution Commonly known as: ALPHAGAN Place 1 drop into both eyes 2 (two) times daily.   flecainide 50 MG tablet Commonly known as: TAMBOCOR Take 1 tablet (50 mg total) by mouth 2 (two) times daily.   idelalisib 150 MG tablet Commonly known as: ZYDELIG Take 1 tablet (150 mg total) by mouth 2 (two) times daily.   Lumigan 0.01 % Soln Generic drug: bimatoprost Place 1 drop into both eyes daily.   metoprolol succinate 25 MG 24 hr tablet Commonly known as: Toprol XL Take 0.5 tablets (12.5 mg total) by mouth daily.   multivitamin with minerals Tabs tablet Take 1 tablet by mouth daily.   NON FORMULARY Take 4 oz by mouth daily. Beet Root   omeprazole 20 MG capsule Commonly known as: PRILOSEC Take 1 capsule (20 mg total) by mouth daily.   PROBIOTIC  DAILY PO Take 1 capsule by mouth daily.   prochlorperazine 10 MG tablet Commonly known as: COMPAZINE Take 1 tablet (10 mg total) by mouth every 6 (six) hours as needed for nausea or vomiting.   rosuvastatin 20 MG tablet Commonly known as: CRESTOR Take 1 tablet (20 mg total) by mouth daily. What changed: when to take this   terazosin 2 MG capsule Commonly known as: HYTRIN Take 2 mg by mouth at bedtime.        Objective:   BP 94/65   Pulse 68   Temp 98 F (36.7 C)   Ht 6' (1.829 m)   Wt 194 lb (88 kg)   SpO2 94%   BMI 26.31 kg/m     Wt Readings from Last 3 Encounters:  10/01/19 194 lb (88 kg)  09/14/19 197 lb (89.4 kg)  08/28/19 195 lb (88.5 kg)    Physical Exam Vitals and nursing note reviewed.  Constitutional:      General: He is not in acute distress.    Appearance: He is well-developed. He is not diaphoretic.  Eyes:     General: No scleral icterus.    Conjunctiva/sclera: Conjunctivae normal.  Neck:     Thyroid: No thyromegaly.  Cardiovascular:     Rate and Rhythm: Normal rate and regular rhythm.     Heart sounds: Normal heart sounds. No murmur heard.   Pulmonary:     Effort: Pulmonary effort is normal. No respiratory distress.     Breath sounds: Normal breath sounds. No wheezing.  Musculoskeletal:        General: Normal range of motion.     Cervical back: Neck supple.  Lymphadenopathy:     Cervical: No cervical adenopathy.  Skin:    General: Skin is warm and dry.     Findings: No rash.  Neurological:     Mental Status: He is alert and oriented to person, place, and time.     Coordination: Coordination normal.  Psychiatric:        Behavior: Behavior normal.     Results for orders placed or performed in visit on 09/27/19  Lipid panel  Result Value Ref Range   Cholesterol, Total 105 100 - 199 mg/dL   Triglycerides 106 0 - 149 mg/dL   HDL 49 >39 mg/dL   VLDL Cholesterol Cal 20 5 - 40 mg/dL   LDL Chol Calc (NIH) 36 0 - 99 mg/dL   Chol/HDL Ratio 2.1 0.0 - 5.0 ratio  CMP14+EGFR  Result Value Ref Range   Glucose 113 (H) 65 - 99 mg/dL   BUN 15 8 - 27 mg/dL   Creatinine, Ser 1.19 0.76 - 1.27 mg/dL   GFR calc non Af Amer 60 >59 mL/min/1.73   GFR calc Af Amer 69 >59 mL/min/1.73   BUN/Creatinine Ratio 13 10 - 24   Sodium 140 134 - 144 mmol/L   Potassium 4.8 3.5 - 5.2 mmol/L   Chloride 103 96 - 106 mmol/L   CO2 24 20 - 29 mmol/L   Calcium 9.2 8.6 - 10.2 mg/dL   Total Protein 6.4 6.0 - 8.5 g/dL   Albumin 4.4 3.7 - 4.7 g/dL   Globulin, Total 2.0 1.5 - 4.5 g/dL   Albumin/Globulin Ratio 2.2 1.2  - 2.2   Bilirubin Total 0.5 0.0 - 1.2 mg/dL   Alkaline Phosphatase 56 44 - 121 IU/L   AST 15 0 - 40 IU/L   ALT 16 0 - 44 IU/L  CBC with Differential/Platelet  Result  Value Ref Range   WBC 8.4 3.4 - 10.8 x10E3/uL   RBC 5.09 4.14 - 5.80 x10E6/uL   Hemoglobin 15.6 13.0 - 17.7 g/dL   Hematocrit 46.2 37.5 - 51.0 %   MCV 91 79 - 97 fL   MCH 30.6 26.6 - 33.0 pg   MCHC 33.8 31 - 35 g/dL   RDW 14.0 11.6 - 15.4 %   Platelets 146 (L) 150 - 450 x10E3/uL   Neutrophils 54 Not Estab. %   Lymphs 17 Not Estab. %   Monocytes 10 Not Estab. %   Eos 18 Not Estab. %   Basos 1 Not Estab. %   Neutrophils Absolute 4.6 1 - 7 x10E3/uL   Lymphocytes Absolute 1.4 0 - 3 x10E3/uL   Monocytes Absolute 0.8 0 - 0 x10E3/uL   EOS (ABSOLUTE) 1.5 (H) 0.0 - 0.4 x10E3/uL   Basophils Absolute 0.1 0 - 0 x10E3/uL   Immature Granulocytes 0 Not Estab. %   Immature Grans (Abs) 0.0 0.0 - 0.1 x10E3/uL    Assessment & Plan:   Problem List Items Addressed This Visit      Cardiovascular and Mediastinum   PAF (paroxysmal atrial fibrillation) (HCC) (Chronic)   Relevant Orders   CBC with Differential/Platelet   CMP14+EGFR     Digestive   GERD (gastroesophageal reflux disease)   Relevant Orders   CMP14+EGFR     Other   Hyperlipidemia   Relevant Orders   Lipid panel   Prediabetes - Primary   Relevant Orders   Bayer DCA Hb A1c Waived   CMP14+EGFR      Will monitor dizziness, has already cut metoprolol in half and we will see how it continues to go, blood work looks pretty good, blood sugars continue to be borderline. Follow up plan: Return in about 3 months (around 12/31/2019), or if symptoms worsen or fail to improve, for Prediabetes and hyperlipidemia.  Counseling provided for all of the vaccine components Orders Placed This Encounter  Procedures  . Bayer DCA Hb A1c Waived  . CBC with Differential/Platelet  . CMP14+EGFR  . Lipid panel    Caryl Pina, MD [facility] 10/01/2019, 11:16 AM

## 2019-10-05 NOTE — Telephone Encounter (Signed)
Can hold metoprolol all together over the weekeend, update [place] on Monday   Zandra Abts MD

## 2019-10-15 ENCOUNTER — Encounter: Payer: Self-pay | Admitting: Hematology and Oncology

## 2019-10-15 NOTE — Telephone Encounter (Signed)
Can we get an update from this patient how he has been doing and also what he has been doing with the metoprolol dosing   Zandra Abts MD

## 2019-10-16 MED FILL — ZYDELIG 150 MG TABLET: 150 | 30 days supply | Qty: 60 | Fill #8

## 2019-10-17 ENCOUNTER — Encounter: Payer: Self-pay | Admitting: Hematology and Oncology

## 2019-10-19 ENCOUNTER — Telehealth: Payer: Self-pay

## 2019-10-19 MED ORDER — MECLIZINE HCL 25 MG PO TABS: 25.0000 mg | ORAL_TABLET | Freq: Three times a day (TID) | ORAL | 1 refills | Status: DC | PRN

## 2019-10-19 MED ORDER — ONDANSETRON HCL 4 MG PO TABS: 4.0000 mg | ORAL_TABLET | Freq: Three times a day (TID) | ORAL | 1 refills | Status: DC | PRN

## 2019-10-19 NOTE — Telephone Encounter (Signed)
Please let her know that I sent Zofran and meclizine, the meclizine helps with the dizziness and the Zofran should help withdrawal nausea, I sent them to [facility], if they do not work then let me know.

## 2019-10-19 NOTE — Telephone Encounter (Signed)
Wife aware

## 2019-10-23 ENCOUNTER — Encounter: Payer: Self-pay | Admitting: Hematology and Oncology

## 2019-10-23 ENCOUNTER — Telehealth: Payer: Self-pay

## 2019-10-23 MED ORDER — PROMETHAZINE HCL 25 MG PO TABS: 25.0000 mg | ORAL_TABLET | Freq: Three times a day (TID) | ORAL | 0 refills | Status: DC | PRN

## 2019-10-23 NOTE — Telephone Encounter (Signed)
I sent in other nausea medicine called Phenergan for him, see if that works better, he can still use the Zofran with that as well.

## 2019-10-23 NOTE — Telephone Encounter (Signed)
Yes try the Phenergan and see if he can stay hydrated, the bending over and stumbling is likely because he is getting dehydrated and he really needs to try and get his hydration up, see if the Phenergan helps with that.  If it is not improving from there have him come in and be seen.

## 2019-10-23 NOTE — Telephone Encounter (Signed)
Anthony Kelly informed to make sure pt stays hydrated. She believes that the problem is not dehydration because the pt drinks 3L of water qd.  Appt has been scheduled for 10/26/19.

## 2019-10-23 NOTE — Telephone Encounter (Signed)
Per wife it is the vertigo and stumbling around when he stands up or bends over that she is worried about.  No longer wants to take Zofran due to causing HA's.  Fine with trying Phenergan.  B/P has been 114/66 or 90's over 50's. He is taking 1/2 of Metoprolol currently.

## 2019-10-23 NOTE — Telephone Encounter (Signed)
Please review and advise.

## 2019-10-24 DIAGNOSIS — H401131 Primary open-angle glaucoma, bilateral, mild stage: Secondary | ICD-10-CM | POA: Diagnosis not present

## 2019-10-25 ENCOUNTER — Other Ambulatory Visit: Payer: Self-pay | Admitting: Family Medicine

## 2019-10-25 DIAGNOSIS — K219 Gastro-esophageal reflux disease without esophagitis: Secondary | ICD-10-CM

## 2019-10-25 DIAGNOSIS — Z23 Encounter for immunization: Secondary | ICD-10-CM | POA: Diagnosis not present

## 2019-10-26 ENCOUNTER — Ambulatory Visit: Payer: Medicare Other | Admitting: Family Medicine

## 2019-10-30 ENCOUNTER — Other Ambulatory Visit: Payer: Self-pay | Admitting: *Deleted

## 2019-10-30 MED ORDER — METOPROLOL SUCCINATE ER 25 MG PO TB24
12.5000 mg | ORAL_TABLET | Freq: Every day | ORAL | 1 refills | Status: DC
Start: 2019-10-30 — End: 2020-07-08

## 2019-11-12 MED FILL — ZYDELIG 150 MG TABLET: 150 | 30 days supply | Qty: 60 | Fill #9

## 2019-11-16 ENCOUNTER — Inpatient Hospital Stay: Payer: Medicare Other | Attending: Hematology and Oncology

## 2019-11-16 ENCOUNTER — Encounter: Payer: Self-pay | Admitting: Hematology and Oncology

## 2019-11-16 ENCOUNTER — Telehealth: Payer: Self-pay | Admitting: Hematology and Oncology

## 2019-11-16 ENCOUNTER — Ambulatory Visit (HOSPITAL_COMMUNITY)
Admission: RE | Admit: 2019-11-16 | Discharge: 2019-11-16 | Disposition: A | Discharge status: Home / Self Care | Payer: Medicare Other | Source: Ambulatory Visit | Attending: Hematology and Oncology | Admitting: Hematology and Oncology

## 2019-11-16 ENCOUNTER — Inpatient Hospital Stay (HOSPITAL_BASED_OUTPATIENT_CLINIC_OR_DEPARTMENT_OTHER): Payer: Medicare Other | Admitting: Hematology and Oncology

## 2019-11-16 ENCOUNTER — Encounter (HOSPITAL_COMMUNITY): Payer: Self-pay

## 2019-11-16 ENCOUNTER — Other Ambulatory Visit: Payer: Self-pay

## 2019-11-16 DIAGNOSIS — C8280 Other types of follicular lymphoma, unspecified site: Secondary | ICD-10-CM

## 2019-11-16 DIAGNOSIS — I2510 Atherosclerotic heart disease of native coronary artery without angina pectoris: Secondary | ICD-10-CM

## 2019-11-16 DIAGNOSIS — K439 Ventral hernia without obstruction or gangrene: Secondary | ICD-10-CM | POA: Diagnosis not present

## 2019-11-16 DIAGNOSIS — Z9221 Personal history of antineoplastic chemotherapy: Secondary | ICD-10-CM | POA: Insufficient documentation

## 2019-11-16 DIAGNOSIS — Z79899 Other long term (current) drug therapy: Secondary | ICD-10-CM | POA: Insufficient documentation

## 2019-11-16 DIAGNOSIS — N281 Cyst of kidney, acquired: Secondary | ICD-10-CM | POA: Diagnosis not present

## 2019-11-16 DIAGNOSIS — C8295 Follicular lymphoma, unspecified, lymph nodes of inguinal region and lower limb: Secondary | ICD-10-CM | POA: Insufficient documentation

## 2019-11-16 DIAGNOSIS — K838 Other specified diseases of biliary tract: Secondary | ICD-10-CM | POA: Diagnosis not present

## 2019-11-16 DIAGNOSIS — C8510 Unspecified B-cell lymphoma, unspecified site: Secondary | ICD-10-CM | POA: Diagnosis not present

## 2019-11-16 DIAGNOSIS — K7689 Other specified diseases of liver: Secondary | ICD-10-CM | POA: Diagnosis not present

## 2019-11-16 DIAGNOSIS — Z299 Encounter for prophylactic measures, unspecified: Secondary | ICD-10-CM

## 2019-11-16 DIAGNOSIS — D696 Thrombocytopenia, unspecified: Secondary | ICD-10-CM

## 2019-11-16 DIAGNOSIS — Z7901 Long term (current) use of anticoagulants: Secondary | ICD-10-CM | POA: Insufficient documentation

## 2019-11-16 DIAGNOSIS — C7800 Secondary malignant neoplasm of unspecified lung: Secondary | ICD-10-CM | POA: Diagnosis not present

## 2019-11-16 LAB — COMPREHENSIVE METABOLIC PANEL
ALT: 24 U/L (ref 0–44)
AST: 20 U/L (ref 15–41)
Albumin: 4.2 g/dL (ref 3.5–5.0)
Alkaline Phosphatase: 52 U/L (ref 38–126)
Anion gap: 6 (ref 5–15)
BUN: 17 mg/dL (ref 8–23)
CO2: 28 mmol/L (ref 22–32)
Calcium: 9.3 mg/dL (ref 8.9–10.3)
Chloride: 105 mmol/L (ref 98–111)
Creatinine, Ser: 1.18 mg/dL (ref 0.61–1.24)
GFR, Estimated: >60 mL/min (ref >60)
Glucose, Bld: 124 mg/dL — ABNORMAL HIGH (ref 70–99)
Potassium: 4.6 mmol/L (ref 3.5–5.1)
Sodium: 139 mmol/L (ref 135–145)
Total Bilirubin: 0.8 mg/dL (ref 0.3–1.2)
Total Protein: 6.7 g/dL (ref 6.5–8.1)

## 2019-11-16 LAB — CBC WITH DIFFERENTIAL/PLATELET
Abs Immature Granulocytes: 0.02 K/uL (ref 0.00–0.07)
Basophils Absolute: 0.1 K/uL (ref 0.0–0.1)
Basophils Relative: 1 %
Eosinophils Absolute: 0.8 K/uL — ABNORMAL HIGH (ref 0.0–0.5)
Eosinophils Relative: 13 %
HCT: 47.4 % (ref 39.0–52.0)
Hemoglobin: 16.6 g/dL (ref 13.0–17.0)
Immature Granulocytes: 0 %
Lymphocytes Relative: 18 %
Lymphs Abs: 1.2 K/uL (ref 0.7–4.0)
MCH: 30.8 pg (ref 26.0–34.0)
MCHC: 35 g/dL (ref 30.0–36.0)
MCV: 87.9 fL (ref 80.0–100.0)
Monocytes Absolute: 0.6 K/uL (ref 0.1–1.0)
Monocytes Relative: 10 %
Neutro Abs: 3.7 K/uL (ref 1.7–7.7)
Neutrophils Relative %: 58 %
Platelets: 143 K/uL — ABNORMAL LOW (ref 150–400)
RBC: 5.39 MIL/uL (ref 4.22–5.81)
RDW: 12.9 % (ref 11.5–15.5)
WBC: 6.3 K/uL (ref 4.0–10.5)
nRBC: 0 % (ref 0.0–0.2)

## 2019-11-16 IMAGING — CT CT CHEST-ABD-PELV W/ CM
3 of 5 series · 15 of 36 positions shown, 17 images · IV contrast (OMNIPAQUE)
Comparison: [DATE]

CLINICAL DATA: B cell lymphoma with lung mets chemo comp; oral
chemo ongoing; no c/o^100mL OMNIPAQUE IOHEXOL 300 MG/ML
SOLNlymphoma on chemo

EXAM:
CT CHEST, ABDOMEN, AND PELVIS WITH CONTRAST
TECHNIQUE: Multidetector CT imaging of the chest, abdomen and pelvis was
performed following the standard protocol during bolus
administration of intravenous contrast.
CONTRAST:  100mL OMNIPAQUE IOHEXOL 300 MG/ML  SOLN

[Series 2: cap with · axial · 0.94mm/px · z∈[+1251,+1801]mm · 10 of 136 slices shown, 12 images]
[im 13/136  mediastinal]
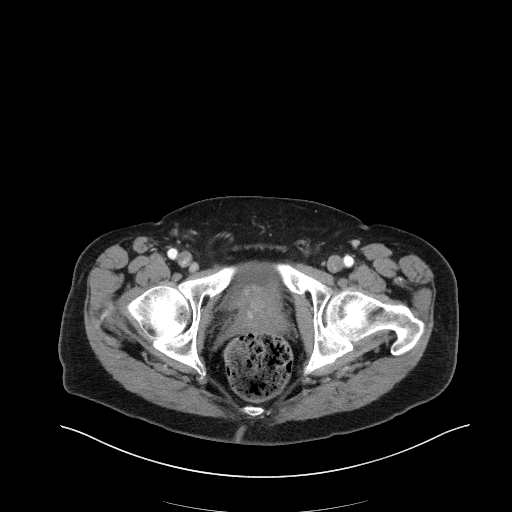
[im 13/136  bone]
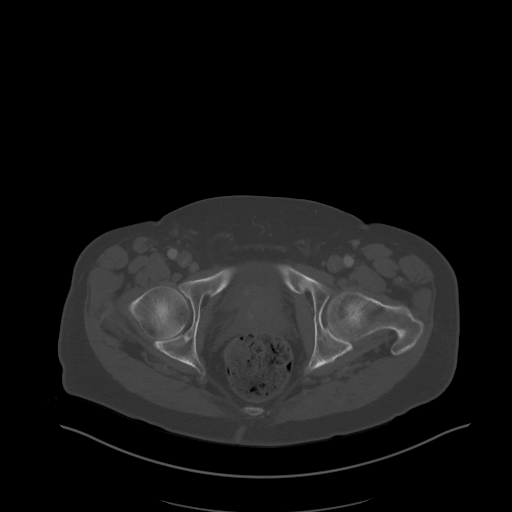
[im 25/136  mediastinal]
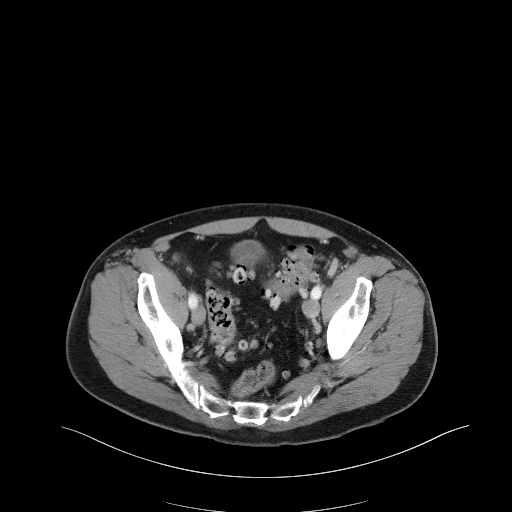
[im 37/136  mediastinal]
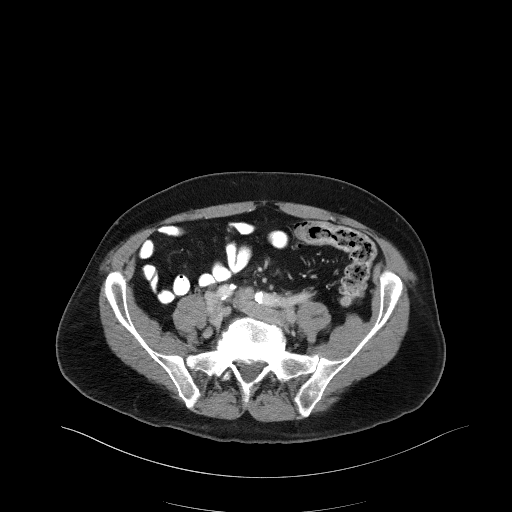
[im 50/136  mediastinal]
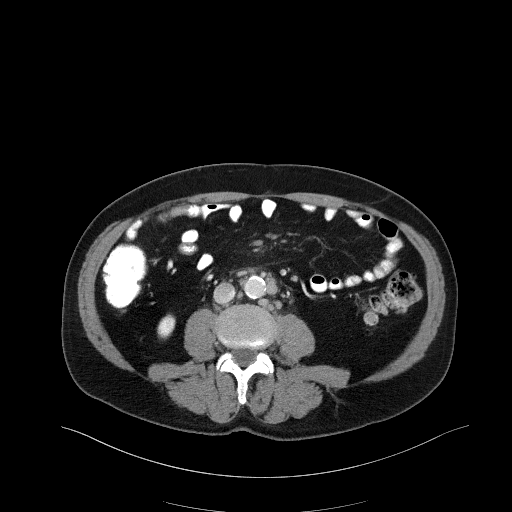
[im 62/136  mediastinal]
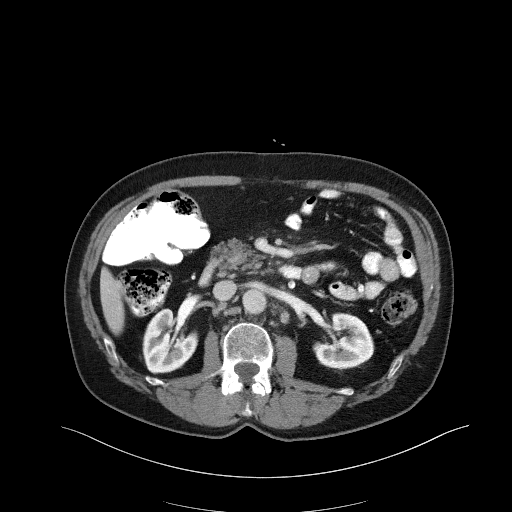
[im 74/136  mediastinal]
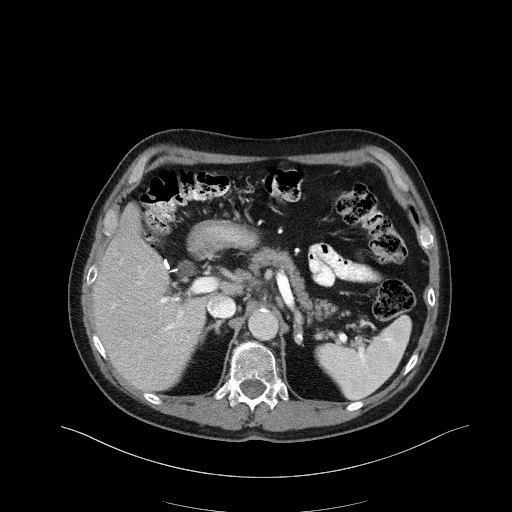
[im 86/136  mediastinal]
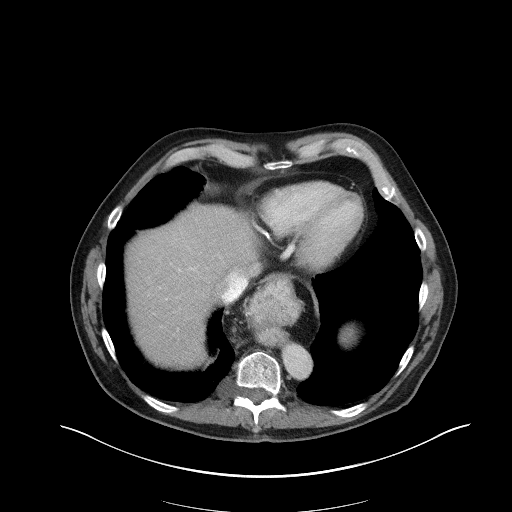
[im 99/136  mediastinal]
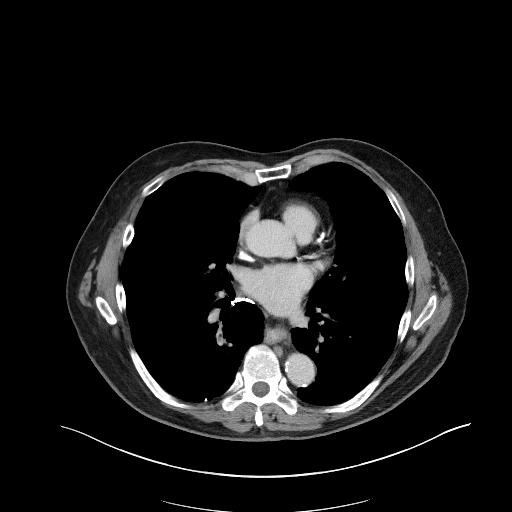
[im 111/136  mediastinal]
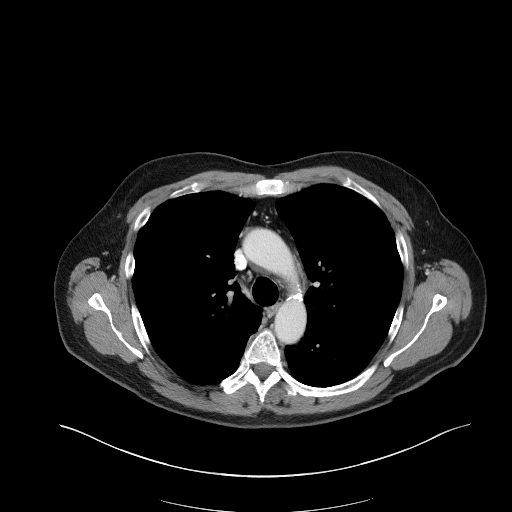
[im 111/136  bone]
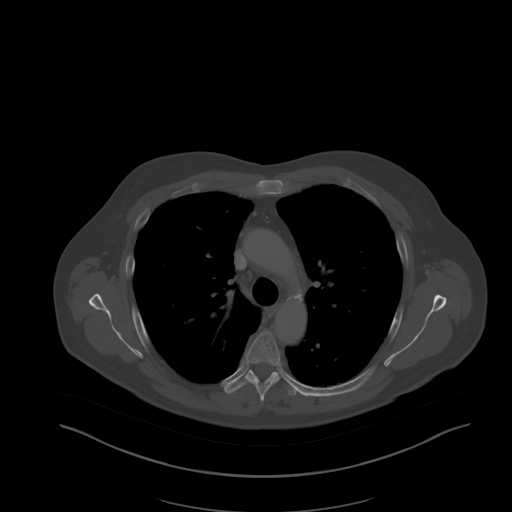
[im 123/136  mediastinal]
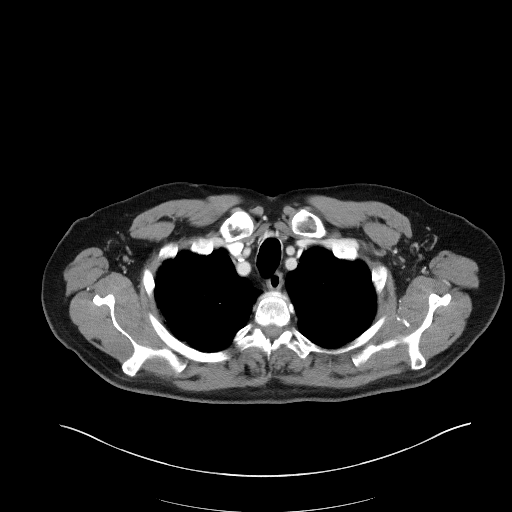

[Series 4: coronals · coronal · 1.08mm/px · 3 of 162 slices shown]
[im 33/162  mediastinal]
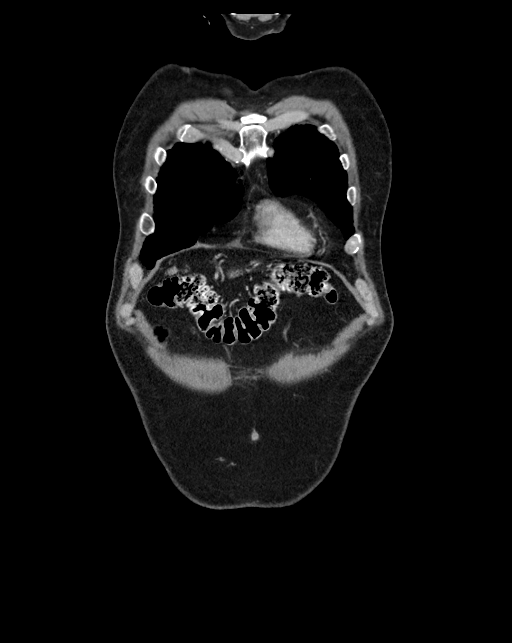
[im 65/162  mediastinal]
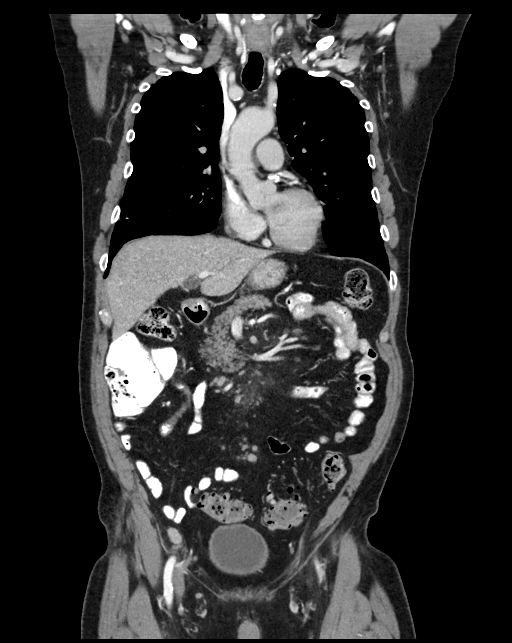
[im 97/162  mediastinal]
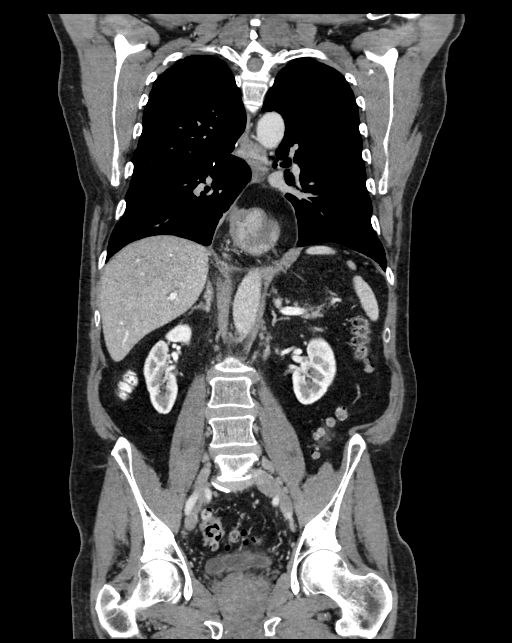

[Series 6: lung · axial · 0.94mm/px · z∈[+1598,+1646]mm · 2 of 147 slices shown]
[im 13/147  bone]
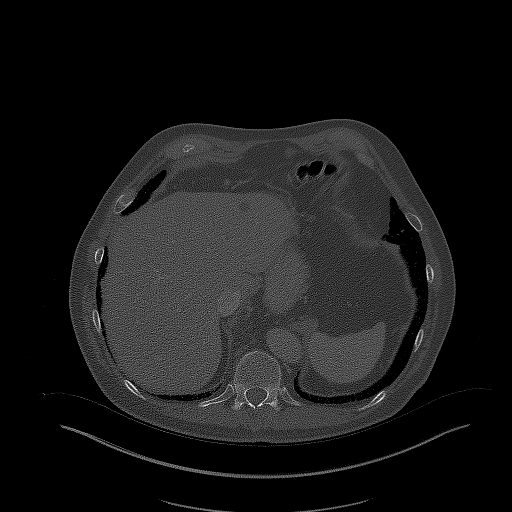
[im 37/147  bone]
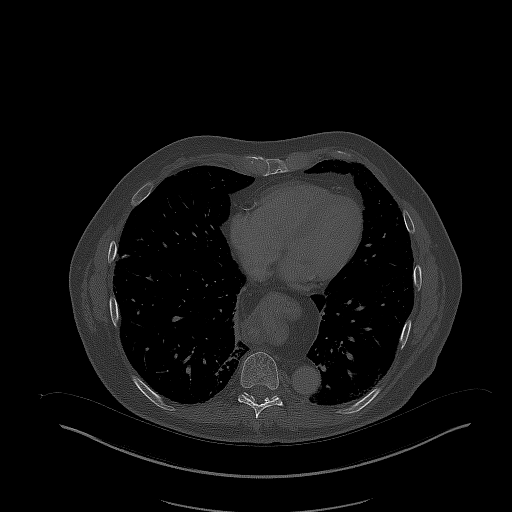

[15 of 36 positions shown; findings below may reference images not displayed]

FINDINGS: CT CHEST FINDINGS

Cardiovascular: Coronary artery calcification and aortic
atherosclerotic calcification.

Mediastinum/Nodes: No axillary supraclavicular adenopathy no
mediastinal hilar adenopathy no pericardial fluid. Large hernia.

Lungs/Pleura: Linear suture line in the RIGHT lung along the major
fissure without evidence of nodularity. No suspicious pulmonary
nodule in LEFT or RIGHT lung. Airways normal

Musculoskeletal: No aggressive osseous lesion.

CT ABDOMEN AND PELVIS FINDINGS

Hepatobiliary: Benign cysts in LEFT hepatic lobe. Mild intrahepatic
extrahepatic biliary duct dilatation following cholecystectomy. No
obstructing lesion identified.

Pancreas: Pancreas is normal. No ductal dilatation. No pancreatic
inflammation.

Spleen: Normal spleen

Adrenals/urinary tract: Adrenal glands and kidneys are normal.
Nonenhancing cyst of the LEFT kidney. The ureters and bladder
normal.

Stomach/Bowel: Stomach, small bowel cecum normal. Ascending,
transverse descending colon normal. Several diverticula sigmoid
colon without acute inflammation.

Vascular/Lymphatic: Calcification abdominal aorta.

There are small periaortic retroperitoneal lymph nodes which are
borderline enlarged.

For example 10 mm node between the IVC and aorta on image 92/series
2. Similar lymph node LEFT of the aorta measuring 10 mm on image 87.
Numerous small lymph nodes are present in the retroperitoneum.

LEFT common iliac lymph node measures 13 mm on image 99. 11 mm RIGHT
common iliac node

Above described periaortic lymph nodes and common iliac lymph nodes
are not changed in size from comparison CT [DATE].

Reproductive: Prostate unremarkable

Other: No free fluid.

Musculoskeletal: No aggressive osseous lesion.
IMPRESSION: Chest Impression:

1. No mediastinal lymphadenopathy.
2. Post RIGHT lung wedge resection without evidence local
recurrence.

Abdomen / Pelvis Impression:

1. Stable numerous small periaortic retroperitoneal nodes and common
iliac nodes. No progression of adenopathy.
2. No splenomegaly.  No skeletal metastasis.

## 2019-11-16 MED ORDER — IOHEXOL 300 MG/ML  SOLN
100.0000 mL | Freq: Once | INTRAMUSCULAR | Status: AC | PRN
  Administered 2019-11-16: 100 mL via INTRAVENOUS

## 2019-11-16 NOTE — Assessment & Plan Note (Signed)
I reviewed his vaccination schedule We discussed the importance of staying up-to-date with his vaccination program

## 2019-11-16 NOTE — Assessment & Plan Note (Signed)
I have reviewed multiple imaging studies with the patient and his wife He has excellent response to therapy Overall, he tolerated treatment fairly well The plan would be to continue treatment indefinitely I plan to space out the interval between his imaging studies I plan to repeat imaging study in May of next year I will see him every 3 months, his next appointment will be in February

## 2019-11-16 NOTE — Progress Notes (Signed)
HEMATOLOGY-ONCOLOGY ELECTRONIC VISIT PROGRESS NOTE  Patient Care Team: Dettinger, [name], MD as PCP - General (Family Medicine) Branch, Alphonse Guild, MD as PCP - Cardiology (Cardiology) Thompson Grayer, MD as PCP - Electrophysiology (Cardiology) [name], [name], MD (Inactive) as Consulting Physician (Cardiology) [name], [name], MD as Consulting Physician (Dermatology)  I connected with by [facility] video conference and verified that I am speaking with the correct person using two identifiers.  I discussed the limitations, risks, security and privacy concerns of performing an evaluation and management service by [facility] and the availability of in person appointments.  I also discussed with the patient that there may be a patient responsible charge related to this service. The patient expressed understanding and agreed to proceed.   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 The patient denies recent history of bleeding such as epistaxis, hematuria or hematochezia. He is asymptomatic from the low platelet count. I will observe for now.  he does not require transfusion now. I will continue the chemotherapy at current dose without dosage adjustment.  If the thrombocytopenia gets progressive worse in the future, I might have to delay his treatment or adjust the chemotherapy dose.    Preventive  measure I reviewed his vaccination schedule We discussed the importance of staying up-to-date with his vaccination program   No orders of the defined types were placed in this encounter.   INTERVAL HISTORY: Please see below for problem oriented charting. 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   12/12/2003 - 06/01/2004 Chemotherapy   He was treated with R. CHOP chemotherapy which show complete remission. The number of cycles of R. CHOP chemotherapy was unknown.   12/19/2006 Surgery   Lung resection show follicular lymphoma.   01/02/2007 - 09/01/2008 Chemotherapy   The patient was treated with single agent rituximab alone.   01/19/2007 Bone Marrow Biopsy   Bone marrow biopsy was negative.   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   05/08/2013 - 05/11/2013 Hospital Admission   The patient was admitted to the hospital for management of pericarditis. CT scan showed extensive lymphadenopathy.   06/07/2013 Imaging   PET/CT scan showed extensive lymphadenopathy   06/25/2013 Procedure   He has placement of Infuse-a-Port.   06/28/2013 Bone Marrow Biopsy   Bone marrow biopsy is positive for  lymphoma involvement.   07/04/2013 - 11/22/2013 Chemotherapy   He is treated with 6 cycles of bendamustine with rituximab.   09/24/2013 Imaging   Repeat PET scan show complete remission.    12/26/2013 Imaging   PEt scan showed complete remission   09/26/2014 Imaging   CT scan of the chest abdomen and pelvis show no evidence of disease   03/26/2015 Imaging   CT scan showed no evidence of lymphoma   04/14/2016 Imaging   CT: Borderline prominent right hilar and subcarinal lymph nodes, but not appreciably changed. 2. Low-grade but increased central mesenteric stranding with some small mesenteric lymph nodes. This could certainly be inflammatory, 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, [name], volume estimated at 75 cubic cm.   02/15/2017 Imaging   No evidence of recurrent lymphoma or other acute findings.  Stable moderate hiatal hernia.  Colonic diverticulosis, without radiographic evidence of diverticulitis.  Stable mildly enlarged prostate.  Mild emphysema.  Aortic and coronary artery atherosclerosis.   01/31/2019 Imaging   1. Interval development of abdominal and pelvic adenopathy compatible with recurrent lymphoma. 2. Emphysema and aortic atherosclerosis. 3. Multi vessel coronary artery calcifications. 4. Moderate to large hiatal hernia   02/09/2019 Procedure   Successful CT-guided core biopsy of the left retroperitoneal periaortic adenopathy   02/09/2019 Pathology Results   SURGICAL PATHOLOGY  CASE: [id number]  PATIENT: [name]  Surgical Pathology Report   Clinical History: Lymphoma; [name],  LEFT PARA AORTIC, [name], cleaved nuclei and scant cytoplasm.  By immunohistochemistry, [name], [name], [name], [name] is low (less than 10%).  Flow cytometry was attempted; however, there was insufficient material for analysis (see [id number]).  Overall, the features are consistent with relapse of the patient's previously diagnosed follicular lymphoma.  Based on the biopsy, this is favored to be a low-grade follicular lymphoma   05/16/9739 -  Chemotherapy   The patient had idelisib for chemotherapy treatment.     05/17/2019 Imaging   1. Interval generalized decrease in abdominopelvic lymphadenopathy identified on the previous study. No new or progressive lymphadenopathy on today's study. 2. Moderate hiatal hernia. 3.  Emphysema (ICD10-J43.9) and Aortic Atherosclerosis (ICD10-170.0)   11/16/2019 Imaging   IMPRESSION: Chest Impression:   1. No mediastinal lymphadenopathy. 2. Post RIGHT lung wedge resection without evidence local recurrence.   Abdomen / Pelvis Impression:   1. Stable numerous small periaortic retroperitoneal nodes and common iliac nodes. No progression of adenopathy. 2. No splenomegaly.  No skeletal metastasis.       REVIEW OF SYSTEMS:   Constitutional: Denies fevers, chills or abnormal  weight loss Eyes: Denies blurriness of vision Ears, nose, mouth, throat, and face: Denies mucositis or sore throat Respiratory: Denies cough, dyspnea or wheezes Cardiovascular: Denies palpitation, chest  discomfort Gastrointestinal:  Denies nausea, heartburn or change in bowel habits Skin: Denies abnormal skin rashes Lymphatics: Denies new lymphadenopathy or easy bruising Neurological:Denies numbness, tingling or new weaknesses Behavioral/Psych: Mood is stable, no new changes  Extremities: No lower extremity edema All other systems were reviewed with the patient and are negative.  I have reviewed the past medical history, past surgical history, social history and family history with the patient and they are unchanged from previous note.  ALLERGIES:  has No Known Allergies.  MEDICATIONS:  Current Outpatient Medications  Medication Sig Dispense Refill  . cholecalciferol (VITAMIN D3) 25 MCG (1000 UNIT) tablet Take 1,000 Units by mouth daily.    Marland Kitchen acyclovir (ZOVIRAX) 400 MG tablet Take 1 tablet (400 mg total) by mouth 2 (two) times daily. 180 tablet 6  . apixaban (ELIQUIS) 5 MG TABS tablet Take 1 tablet (5 mg total) by mouth 2 (two) times daily. 60 tablet 1  . brimonidine (ALPHAGAN) 0.2 % ophthalmic solution Place 1 drop into both eyes 2 (two) times daily.     . flecainide (TAMBOCOR) 50 MG tablet Take 1 tablet (50 mg total) by mouth 2 (two) times daily. 180 tablet 3  . idelalisib (ZYDELIG) 150 MG tablet Take 1 tablet (150 mg total) by mouth 2 (two) times daily. 60 tablet 11  . LUMIGAN 0.01 % SOLN Place 1 drop into both eyes daily.    . meclizine (ANTIVERT) 25 MG tablet Take 1 tablet (25 mg total) by mouth 3 (three) times daily as needed for dizziness. 30 tablet 1  . metoprolol succinate (TOPROL XL) 25 MG 24 hr tablet Take 0.5 tablets (12.5 mg total) by mouth daily. 45 tablet 1  . Multiple Vitamin (MULTIVITAMIN WITH MINERALS) TABS tablet Take 1 tablet by mouth daily.     . NON FORMULARY Take 4 oz by mouth daily. Beet Root     . omeprazole (PRILOSEC) 20 MG capsule TAKE 1 CAPSULE DAILY 90 capsule 1  . ondansetron (ZOFRAN) 4 MG tablet Take 1 tablet (4 mg total) by mouth every 8 (eight) hours as  needed for nausea or vomiting. 30 tablet 1  . Probiotic Product (PROBIOTIC DAILY PO) Take 1 capsule by mouth daily.     . prochlorperazine (COMPAZINE) 10 MG tablet Take 1 tablet (10 mg total) by mouth every 6 (six) hours as needed for nausea or vomiting. 30 tablet 2  . promethazine (PHENERGAN) 25 MG tablet Take 1 tablet (25 mg total) by mouth every 8 (eight) hours as needed for nausea or vomiting. 20 tablet 0  . rosuvastatin (CRESTOR) 20 MG tablet Take 1 tablet (20 mg total) by mouth daily. (Patient taking differently: Take 20 mg by mouth at bedtime. ) 90 tablet 3  . terazosin (HYTRIN) 2 MG capsule Take 2 mg by mouth at bedtime.     Current Facility-Administered Medications  Medication Dose Route Frequency Provider Last Rate Last Admin  . sodium chloride flush (NS) 0.9 % injection 3 mL  3 mL Intravenous Q12H [name], MD        PHYSICAL EXAMINATION: ECOG PERFORMANCE STATUS: 0 - Asymptomatic  LABORATORY DATA:  I have reviewed the data as listed CMP Latest Ref Rng & Units 11/16/2019 09/27/2019 08/17/2019  Glucose 70 - 99 mg/dL 124(H) 113(H) 118(H)  BUN 8 - 23 mg/dL 17 15 12  Creatinine 0.61 - 1.24 mg/dL 1.18 1.19 1.14  Sodium 135 - 145 mmol/L 139 140 139  Potassium 3.5 - 5.1 mmol/L 4.6 4.8 4.3  Chloride 98 - 111 mmol/L 105 103 105  CO2 22 - 32 mmol/L _0 Calcium 8.9 - 10.3 mg/dL 9.3 9.2 9.7  Total Protein 6.5 - 8.1 g/dL 6.7 6.4 6.8  Total Bilirubin 0.3 - 1.2 mg/dL 0.8 0.5 0.6  Alkaline Phos 38 - 126 U/L 52 56 68  AST 15 - 41 U/L _1 ALT 0 - 44 U/L _2 Lab Results  Component Value Date   WBC 6.3 11/16/2019   HGB 16.6 11/16/2019   HCT 47.4 11/16/2019   MCV 87.9 11/16/2019   PLT 143 (L) 11/16/2019   NEUTROABS 3.7 11/16/2019     RADIOGRAPHIC STUDIES: I reviewed multiple imaging studies with the patient and family I have personally reviewed the radiological images as listed and agreed with the findings in the report. CT CHEST ABDOMEN PELVIS W  CONTRAST  Result Date: 11/16/2019 CLINICAL DATA:  B cell lymphoma with lung mets chemo comp; oral chemo ongoing; no c/o^197m OMNIPAQUE IOHEXOL 300 MG/ML SOLNlymphoma on chemo EXAM: CT CHEST, ABDOMEN, AND PELVIS WITH CONTRAST TECHNIQUE: Multidetector CT imaging of the chest, abdomen and pelvis was performed following the standard protocol during bolus administration of intravenous contrast. CONTRAST:  1026mOMNIPAQUE IOHEXOL 300 MG/ML  SOLN COMPARISON:  05/17/2019 FINDINGS: CT CHEST FINDINGS Cardiovascular: Coronary artery calcification and aortic atherosclerotic calcification. Mediastinum/Nodes: No axillary supraclavicular adenopathy no mediastinal hilar adenopathy no pericardial fluid. Large hernia. Lungs/Pleura: Linear suture line in the RIGHT lung along the major fissure without evidence of nodularity. No suspicious pulmonary nodule in LEFT or RIGHT lung. Airways normal Musculoskeletal: No aggressive osseous lesion. CT ABDOMEN AND PELVIS FINDINGS Hepatobiliary: Benign cysts in LEFT hepatic lobe. Mild intrahepatic extrahepatic biliary duct dilatation following cholecystectomy. No obstructing lesion identified. Pancreas: Pancreas is normal. No ductal dilatation. No pancreatic inflammation. Spleen: Normal spleen Adrenals/urinary tract: Adrenal glands and kidneys are normal. Nonenhancing cyst of the LEFT kidney. The ureters and bladder normal. Stomach/Bowel: Stomach, small bowel cecum normal. Ascending, transverse descending colon normal. Several diverticula sigmoid colon without acute inflammation. Vascular/Lymphatic: Calcification abdominal aorta. There are small periaortic retroperitoneal lymph nodes which are borderline enlarged. For example 10 mm node between the IVC and aorta on image 92/series 2. Similar lymph node LEFT of the aorta measuring 10 mm on image 87. Numerous small lymph nodes are present in the retroperitoneum. LEFT common iliac lymph node measures 13 mm on image 99. 11 mm RIGHT common iliac  node Above described periaortic lymph nodes and common iliac lymph nodes are not changed in size from comparison CT 05/17/2019. Reproductive: Prostate unremarkable Other: No free fluid. Musculoskeletal: No aggressive osseous lesion. IMPRESSION: Chest Impression: 1. No mediastinal lymphadenopathy. 2. Post RIGHT lung wedge resection without evidence local recurrence. Abdomen / Pelvis Impression: 1. Stable numerous small periaortic retroperitoneal nodes and common iliac nodes. No progression of adenopathy. 2. No splenomegaly.  No skeletal metastasis. Electronically Signed   By: StSuzy Bouchard.D.   On: 11/16/2019 13:44    I discussed the assessment and treatment plan with the patient. The patient was provided an opportunity to ask questions and all were answered. The patient agreed with the plan and demonstrated an understanding of the instructions. The patient was advised to call back or seek an in-person evaluation if the symptoms worsen or if the condition fails to  improve as anticipated.    I spent 25 minutes for the appointment reviewing test results, discuss management and coordination of care.  [name], MD 11/16/2019 3:10 PM

## 2019-11-16 NOTE — Assessment & Plan Note (Signed)
This is likely due to recent treatment. The patient denies recent history of bleeding such as epistaxis, hematuria or hematochezia. He is asymptomatic from the low platelet count. I will observe for now.  he does not require transfusion now. I will continue the chemotherapy at current dose without dosage adjustment.  If the thrombocytopenia gets progressive worse in the future, I might have to delay his treatment or adjust the chemotherapy dose.

## 2019-11-16 NOTE — Telephone Encounter (Signed)
Scheduled appt per 11/5 sch msg - pt aware of appt.

## 2019-11-21 ENCOUNTER — Other Ambulatory Visit: Payer: Self-pay | Admitting: *Deleted

## 2019-11-21 MED ORDER — APIXABAN 5 MG PO TABS
5.0000 mg | ORAL_TABLET | Freq: Two times a day (BID) | ORAL | 2 refills | Status: DC
Start: 2019-11-21 — End: 2020-07-25

## 2019-11-27 NOTE — Telephone Encounter (Signed)
Flecanide should be twice a day morning and evening. What time is he taking the toprol currently?   Zandra Abts MD

## 2019-12-03 DIAGNOSIS — L821 Other seborrheic keratosis: Secondary | ICD-10-CM | POA: Diagnosis not present

## 2019-12-03 DIAGNOSIS — L820 Inflamed seborrheic keratosis: Secondary | ICD-10-CM | POA: Diagnosis not present

## 2019-12-03 DIAGNOSIS — L905 Scar conditions and fibrosis of skin: Secondary | ICD-10-CM | POA: Diagnosis not present

## 2019-12-05 NOTE — Telephone Encounter (Signed)
If taking topriol in the morning, could try taking in the evening, even before bed would be fine.  Zandra Abts MD

## 2019-12-13 ENCOUNTER — Encounter: Payer: Self-pay | Admitting: Family Medicine

## 2019-12-13 DIAGNOSIS — R42 Dizziness and giddiness: Secondary | ICD-10-CM

## 2019-12-13 MED FILL — ZYDELIG 150 MG TABLET: 150 | 30 days supply | Qty: 60 | Fill #10

## 2019-12-13 NOTE — Telephone Encounter (Signed)
Lets have him stop the metoprolol all together and update [place] again on Monday  Zandra Abts MD

## 2019-12-18 DIAGNOSIS — Z23 Encounter for immunization: Secondary | ICD-10-CM | POA: Diagnosis not present

## 2019-12-20 ENCOUNTER — Other Ambulatory Visit: Payer: Self-pay | Admitting: Hematology and Oncology

## 2019-12-20 ENCOUNTER — Encounter: Payer: Self-pay | Admitting: Hematology and Oncology

## 2019-12-20 DIAGNOSIS — B372 Candidiasis of skin and nail: Secondary | ICD-10-CM | POA: Insufficient documentation

## 2019-12-20 MED ORDER — NYSTATIN 100000 UNIT/GM EX OINT: 1.0000 "application " | TOPICAL_OINTMENT | Freq: Two times a day (BID) | CUTANEOUS | 0 refills | Status: DC

## 2019-12-24 NOTE — Telephone Encounter (Signed)
If low heart rates not a lot of other options other than trying to come off the toprol. Can he get a f/u with Dr Rayann Heman to discuss possible other options.  Zandra Abts MD

## 2019-12-27 ENCOUNTER — Other Ambulatory Visit: Payer: Self-pay

## 2019-12-27 ENCOUNTER — Encounter: Payer: Self-pay | Admitting: Neurology

## 2019-12-27 ENCOUNTER — Ambulatory Visit (INDEPENDENT_AMBULATORY_CARE_PROVIDER_SITE_OTHER): Payer: Medicare Other | Admitting: Neurology

## 2019-12-27 VITALS — BP 107/62 | HR 65 | Ht 72.0 in | Wt 197.5 lb

## 2019-12-27 DIAGNOSIS — G629 Polyneuropathy, unspecified: Secondary | ICD-10-CM | POA: Diagnosis not present

## 2019-12-27 DIAGNOSIS — I2510 Atherosclerotic heart disease of native coronary artery without angina pectoris: Secondary | ICD-10-CM

## 2019-12-27 DIAGNOSIS — G603 Idiopathic progressive neuropathy: Secondary | ICD-10-CM

## 2019-12-27 DIAGNOSIS — H8101 Meniere's disease, right ear: Secondary | ICD-10-CM | POA: Diagnosis not present

## 2019-12-27 DIAGNOSIS — H8190 Unspecified disorder of vestibular function, unspecified ear: Secondary | ICD-10-CM

## 2019-12-27 MED ORDER — TRIAMTERENE-HCTZ 37.5-25 MG PO CAPS: 1.0000 | ORAL_CAPSULE | Freq: Every day | ORAL | 5 refills | Status: DC

## 2019-12-27 NOTE — Progress Notes (Signed)
[facility]  PATIENT: Anthony Kelly DOB: Sep 09, 1945  REFERRING DOCTOR OR PCP: Vonna Kotyk Dettinger MD SOURCE: Patient, notes from primary care, laboratory results.  _________________________________   HISTORICAL  CHIEF COMPLAINT:  Chief Complaint  Patient presents with  . New Patient (Initial Visit)    RM 48 with wife. Internal referral from Dettinger, [name], MD for dizziness/vertigo. Has occurred once a month for last two months. Usually lasts over a week for each episode. Feeling okay currently. No recent falls. Worried about meds causing dizziness. Gets nauseous with episodes.     HISTORY OF PRESENT ILLNESS:  I had the pleasure seeing your patient, Anthony Kelly, at [facility] for neurologic consultation regarding his episode of severe dizziness.  He is a 74 year old man who has had episdoes of vertigo associated with nausea/vomiting.   His forst episode was in October and lasted 9 days.   The vertigo (back/forth not spinning) was constant but would worsen with his head looking up (putting in eye drops) or with any movements.   He was vomiting all day the first day and off.on the rest of the time.    He was completely back to normal by early November.   The symptoms returned early December and are just now back to normal.   This second episode was similar but with less nausea nd vomiting.   He noted with both episodes walking into the furniture, walls and door frames -- usually to the right. He did not drive during the episodes and tried to just sit still.  He did not feel nystagmus and his wife did not note that he had any with either episode.    He did not note any change in his hearing or a stuffed up sensation.      He feels he is completely back to his baseline.    He has not seen ENT.   He has not had any imaging studies.    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   REVIEW OF SYSTEMS: Constitutional: No fevers, chills, sweats, or change in appetite Eyes: No visual changes, double vision, eye pain Ear, nose and throat: No hearing loss, ear pain, nasal congestion, sore throat.  Dizziness as above Cardiovascular: No chest pain, palpitations Respiratory: No shortness of breath at rest or with exertion.   No wheezes GastrointestinaI: No nausea, vomiting, diarrhea, abdominal pain, fecal incontinence Genitourinary: No dysuria, urinary retention or frequency.  No nocturia. Musculoskeletal: No neck pain, back pain Integumentary: No rash, pruritus, skin lesions Neurological: as above Psychiatric: No depression at this time.  No anxiety Endocrine: No palpitations, diaphoresis, change in appetite, change in weigh or increased thirst Hematologic/Lymphatic: No anemia, purpura, petechiae.Marland Kitchen  History of follicular B-cell lymphoma Allergic/Immunologic: No itchy/runny eyes, nasal congestion, recent allergic reactions, rashes  ALLERGIES: No Known Allergies  HOME MEDICATIONS:  Current Outpatient Medications:  .  acyclovir (ZOVIRAX) 400 MG tablet, Take 1 tablet (400 mg total) by mouth 2 (two) times daily., Disp: 180 tablet, Rfl: 6 .  apixaban (ELIQUIS) 5 MG TABS tablet, Take 1 tablet (5 mg total) by mouth 2 (two) times daily., Disp: 180 tablet, Rfl: 2 .  brimonidine (ALPHAGAN) 0.2 % ophthalmic solution, Place 1 drop into both eyes 2 (two) times daily. , Disp: , Rfl:  .  cholecalciferol (VITAMIN D3) 25 MCG (1000  UNIT) tablet, Take 1,000 Units by mouth daily., Disp: , Rfl:  .  flecainide (TAMBOCOR) 50 MG tablet, Take 1 tablet (50 mg total) by mouth 2 (two) times daily., Disp: 180 tablet, Rfl: 3 .  idelalisib (ZYDELIG) 150 MG tablet, Take 1 tablet (150 mg total) by mouth 2 (two) times daily., Disp: 60 tablet, Rfl: 11 .  LUMIGAN 0.01 % SOLN, Place 1 drop into both eyes daily., Disp: , Rfl:  .   meclizine (ANTIVERT) 25 MG tablet, Take 1 tablet (25 mg total) by mouth 3 (three) times daily as needed for dizziness., Disp: 30 tablet, Rfl: 1 .  metoprolol succinate (TOPROL XL) 25 MG 24 hr tablet, Take 0.5 tablets (12.5 mg total) by mouth daily., Disp: 45 tablet, Rfl: 1 .  Multiple Vitamin (MULTIVITAMIN WITH MINERALS) TABS tablet, Take 1 tablet by mouth daily. , Disp: , Rfl:  .  NON FORMULARY, Take 4 oz by mouth daily. Beet Root, Disp: , Rfl:  .  nystatin ointment (MYCOSTATIN), Apply 1 application topically 2 (two) times daily., Disp: 30 g, Rfl: 0 .  omeprazole (PRILOSEC) 20 MG capsule, TAKE 1 CAPSULE DAILY, Disp: 90 capsule, Rfl: 1 .  Probiotic Product (PROBIOTIC DAILY PO), Take 1 capsule by mouth daily. , Disp: , Rfl:  .  prochlorperazine (COMPAZINE) 10 MG tablet, Take 1 tablet (10 mg total) by mouth every 6 (six) hours as needed for nausea or vomiting., Disp: 30 tablet, Rfl: 2 .  promethazine (PHENERGAN) 25 MG tablet, Take 1 tablet (25 mg total) by mouth every 8 (eight) hours as needed for nausea or vomiting., Disp: 20 tablet, Rfl: 0 .  terazosin (HYTRIN) 2 MG capsule, Take 2 mg by mouth at bedtime., Disp: , Rfl:  .  rosuvastatin (CRESTOR) 20 MG tablet, Take 1 tablet (20 mg total) by mouth daily. (Patient taking differently: Take 20 mg by mouth at bedtime. ), Disp: 90 tablet, Rfl: 3  Current Facility-Administered Medications:  .  sodium chloride flush (NS) 0.9 % injection 3 mL, 3 mL, Intravenous, Q12H, [name], MD  PAST MEDICAL HISTORY: Past Medical History:  Diagnosis Date  . Acute pericarditis    a. 04/2013 -adm with CP, elevated CRP. H/o coronary artery calcification on prior CT but nuc was negative, EF 71%.  . Arthritis   . Atrial fibrillation ([place])    a. Isolated episode in the setting of acute pericarditis 04/2013. Was not placed on anticoag.  Marland Kitchen BPH (benign prostatic hyperplasia)   . Cataract   . Diverticulitis 08/16/2013  . Hyperlipidemia    elevated triglycerides   . 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 Malignant lymphoma, high grade ([place]) 03/14/2011  . 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 Pain in joint, pelvic region and thigh 08/01/2013  . PSVT (paroxysmal supraventricular tachycardia) ([place])     PAST SURGICAL HISTORY: Past Surgical History:  Procedure Laterality Date  . CHOLECYSTECTOMY    . EXPLORATORY LAPAROTOMY    . EYE SURGERY Right 2016   cataract  . LEFT HEART CATH AND CORONARY ANGIOGRAPHY N/A 06/05/2019   Procedure: LEFT HEART CATH AND CORONARY ANGIOGRAPHY;  Surgeon: Jettie Booze, MD;  Location: [facility];  Service:  Cardiovascular;  Laterality: N/A;  . LUNG LOBECTOMY     right side  . LYMPH NODE BIOPSY     in groin with removal  . MENISCUS REPAIR     right knee  . PROSTATE SURGERY    . REFRACTIVE SURGERY Right    piece of metal removed  . SUBMANDIBULAR GLAND EXCISION  04/2011  . SUBMANDIBULAR GLAND EXCISION  04/30/2011   Procedure: EXCISION SUBMANDIBULAR GLAND;  Surgeon: Jerrell Belfast, MD;  Location: [facility];  Service: ENT;  Laterality: Left;  WITH DIAGNOSTIC BIOPSY  . TONSILLECTOMY     as a child  . VEIN LIGATION AND STRIPPING     right leg    FAMILY HISTORY: Family History  Problem Relation Age of Onset  . Heart disease Father   . Heart attack Father        x 3  . Cancer Sister        breast ca  . Cancer Brother        prostate ca  . Heart attack Sister   . Cancer Sister        breast  . Heart disease Brother   . Alzheimer's disease Sister   . Diabetes Sister   . Heart disease Brother   . Diabetes Brother   . Heart disease Brother   . Heart disease Brother   . Heart disease Brother   . Heart disease  Brother   . Heart disease Brother   . Alzheimer's disease Brother   . Diabetes Son   . Anesthesia problems Neg Hx     SOCIAL HISTORY:  Social History   Socioeconomic History  . Marital status: Married    Spouse name: [name]  . Number of children: 2  . Years of education: GED  . Highest education level: GED or equivalent  Occupational History  . Occupation: Retired     Comment: Secretary/administrator  . Occupation: Retired     Comment: [employer]  Tobacco Use  . Smoking status: Former Smoker    Packs/day: 1.50    Years: 25.00    Pack years: 37.50    Types: Cigarettes    Start date: 12/12/1962    Quit date: 01/11/1990    Years since quitting: 29.9  . Smokeless tobacco: Never Used  Vaping Use  . Vaping Use: Never used  Substance and Sexual Activity  . Alcohol use: No    Alcohol/week: 0.0 standard drinks  . Drug use: No  . Sexual activity: Not Currently  Other Topics Concern  . Not on file  Social History Narrative   Right handed   Decaf coffee (2-3 per day)   Lives with wife   Social Determinants of Health   Financial Resource Strain: Not on file  Food Insecurity: Not on file  Transportation Needs: Not on file  Physical Activity: Not on file  Stress: Not on file  Social Connections: Not on file  Intimate Partner Violence: Not on file     PHYSICAL EXAM  Vitals:   12/27/19 1036  BP: 107/62  Pulse: 65  Weight: 197 lb 8 oz (89.6 kg)  Height: 6' (1.829 m)    Body mass index is 26.79 kg/m.   General: The patient is well-developed and well-nourished and in no acute distress  HEENT:  Head is Anthony Kelly.  Sclera are anicteric.  Funduscopic exam shows normal optic discs and retinal vessels.  Neck: No carotid bruits are noted.  The neck is nontender.  Cardiovascular: The heart has  a regular rate and rhythm with a normal S1 and S2. There were no murmurs, gallops or rubs.    Skin: Extremities are without rash or  edema.  Musculoskeletal:  Back is  nontender  Neurologic Exam  Mental status: The patient is alert and oriented x 3 at the time of the examination. The patient has apparent normal recent and remote memory, with an apparently normal attention span and concentration ability.   Speech is normal.  Cranial nerves: Extraocular movements are full. Pupils are equal, round, and reactive to light and accomodation.  Visual fields are full.  Facial symmetry is present. There is good facial sensation to soft touch bilaterally.Facial strength is normal.  Trapezius and sternocleidomastoid strength is normal. No dysarthria is noted.  The tongue is midline, and the patient has symmetric elevation of the soft palate. No obvious hearing deficits are note but the Weber lateralized to the left.    Motor:  Muscle bulk is normal.   Tone is normal. Strength is  5 / 5 in all 4 extremities.   Sensory: Sensory testing is intact to pinprick, soft touch and vibration sensation in all 4 extremities.  Coordination: Cerebellar testing reveals good finger-nose-finger and heel-to-shin bilaterally.  Gait and station: Station is normal.   Gait is normal. Tandem gait is normal. Romberg is negative.   Reflexes: Deep tendon reflexes are symmetric and normal bilaterally.   Plantar responses are flexor.    DIAGNOSTIC DATA (LABS, IMAGING, TESTING) - I reviewed patient records, labs, notes, testing and imaging myself where available.  Lab Results  Component Value Date   WBC 6.3 11/16/2019   HGB 16.6 11/16/2019   HCT 47.4 11/16/2019   MCV 87.9 11/16/2019   PLT 143 (L) 11/16/2019      Component Value Date/Time   NA 139 11/16/2019 0929   NA 140 09/27/2019 0801   NA 139 04/14/2016 0943   K 4.6 11/16/2019 0929   K 4.6 04/14/2016 0943   CL 105 11/16/2019 0929   CL 104 03/24/2012 1024   CO2 28 11/16/2019 0929   CO2 25 04/14/2016 0943   GLUCOSE 124 (H) 11/16/2019 0929   GLUCOSE 111 04/14/2016 0943   GLUCOSE 80 03/24/2012 1024   BUN 17 11/16/2019 0929    BUN 15 09/27/2019 0801   BUN 8.6 04/14/2016 0943   CREATININE 1.18 11/16/2019 0929   CREATININE 1.1 04/14/2016 0943   CALCIUM 9.3 11/16/2019 0929   CALCIUM 9.4 04/14/2016 0943   PROT 6.7 11/16/2019 0929   PROT 6.4 09/27/2019 0801   PROT 6.5 04/14/2016 0943   ALBUMIN 4.2 11/16/2019 0929   ALBUMIN 4.4 09/27/2019 0801   ALBUMIN 3.8 04/14/2016 0943   AST 20 11/16/2019 0929   AST 28 04/14/2016 0943   ALT 24 11/16/2019 0929   ALT 41 04/14/2016 0943   ALKPHOS 52 11/16/2019 0929   ALKPHOS 71 04/14/2016 0943   BILITOT 0.8 11/16/2019 0929   BILITOT 0.5 09/27/2019 0801   BILITOT 0.61 04/14/2016 0943   GFRNONAA >60 11/16/2019 0929   GFRAA 69 09/27/2019 0801   Lab Results  Component Value Date   CHOL 105 09/27/2019   HDL 49 09/27/2019   LDLCALC 36 09/27/2019   TRIG 106 09/27/2019   CHOLHDL 2.1 09/27/2019   Lab Results  Component Value Date   HGBA1C 5.9 03/22/2019   Lab Results  Component Value Date   VITAMINB12 585 08/17/2017   Lab Results  Component Value Date   TSH 1.690 01/14/2017  ASSESSMENT AND PLAN  Meniere's disease of right ear - Plan: Vitamin B12  Episodic recurrent vertigo - Plan: Vitamin B12  Polyneuropathy - Plan: Multiple Myeloma Panel (SPEP&IFE w/QIG), Vitamin B12  Idiopathic progressive neuropathy  - Plan: Vitamin B12    Anthony Kelly is a 74 year old man who has had two severe episodes of vertigo lasting up to a week over the last 2 months.  These were associated with nausea and during the first episode severe vomiting for a couple of days.  I most concerned about the possibility of Mnire's disease.  Therefore, I would like him to start Dyazide.  Because BP is low normal, if he gets dizzy we may need to stop and consider HCTZ monotherapy instead of the combination of Dyazide.  Additionally, he has numbness in his feet consistent with a mild polyneuropathy.  I will also check some blood work for B12 deficiency and SPEP/IEF.  If the Dyazide does not  control his symptoms we would need to check an MRI of the brain to make sure that there is not intracranial process.  He will return to see me in several months or sooner if there are new or worsening neurologic symptoms.   Talasia Saulter A. Felecia Shelling, MD, [facility] 16/55/3748, 27:07 AM Certified in Neurology, Clinical Neurophysiology, Sleep Medicine and Neuroimaging  [facility] [address], [place], [place] [address] [phone]

## 2019-12-28 ENCOUNTER — Encounter: Payer: Self-pay | Admitting: Neurology

## 2019-12-31 LAB — MULTIPLE MYELOMA PANEL, SERUM
Albumin SerPl Elph-Mcnc: 4.1 g/dL (ref 2.9–4.4)
Albumin/Glob SerPl: 1.8 — ABNORMAL HIGH (ref 0.7–1.7)
Alpha 1: 0.3 g/dL (ref 0.0–0.4)
Alpha2 Glob SerPl Elph-Mcnc: 0.7 g/dL (ref 0.4–1.0)
B-Globulin SerPl Elph-Mcnc: 0.9 g/dL (ref 0.7–1.3)
Gamma Glob SerPl Elph-Mcnc: 0.5 g/dL (ref 0.4–1.8)
Globulin, Total: 2.3 g/dL (ref 2.2–3.9)
IgA/Immunoglobulin A, Serum: 131 mg/dL (ref 61–437)
IgG (Immunoglobin G), Serum: 534 mg/dL — ABNORMAL LOW (ref 603–1613)
IgM (Immunoglobulin M), Srm: 24 mg/dL (ref 15–143)
Total Protein: 6.4 g/dL (ref 6.0–8.5)

## 2019-12-31 LAB — VITAMIN B12: Vitamin B-12: 625 pg/mL (ref 232–1245)

## 2020-01-01 DIAGNOSIS — L219 Seborrheic dermatitis, unspecified: Secondary | ICD-10-CM | POA: Diagnosis not present

## 2020-01-01 DIAGNOSIS — L270 Generalized skin eruption due to drugs and medicaments taken internally: Secondary | ICD-10-CM | POA: Diagnosis not present

## 2020-01-01 DIAGNOSIS — L209 Atopic dermatitis, unspecified: Secondary | ICD-10-CM | POA: Diagnosis not present

## 2020-01-07 MED FILL — ZYDELIG 150 MG TABLET: 150 | 30 days supply | Qty: 60 | Fill #11

## 2020-01-23 ENCOUNTER — Other Ambulatory Visit: Payer: Self-pay | Admitting: Family Medicine

## 2020-01-31 ENCOUNTER — Other Ambulatory Visit: Payer: Self-pay | Admitting: Hematology and Oncology

## 2020-02-06 MED FILL — ZYDELIG 150 MG TABLET: 150 | 30 days supply | Qty: 60 | Fill #0

## 2020-02-13 ENCOUNTER — Ambulatory Visit (INDEPENDENT_AMBULATORY_CARE_PROVIDER_SITE_OTHER): Payer: Medicare Other | Admitting: Cardiology

## 2020-02-13 ENCOUNTER — Encounter: Payer: Self-pay | Admitting: Cardiology

## 2020-02-13 VITALS — BP 124/80 | HR 76 | Ht 72.0 in | Wt 195.2 lb

## 2020-02-13 DIAGNOSIS — I4729 Other ventricular tachycardia: Secondary | ICD-10-CM

## 2020-02-13 DIAGNOSIS — I2510 Atherosclerotic heart disease of native coronary artery without angina pectoris: Secondary | ICD-10-CM | POA: Diagnosis not present

## 2020-02-13 DIAGNOSIS — I480 Paroxysmal atrial fibrillation: Secondary | ICD-10-CM | POA: Diagnosis not present

## 2020-02-13 DIAGNOSIS — I472 Ventricular tachycardia: Secondary | ICD-10-CM

## 2020-02-13 NOTE — Progress Notes (Signed)
Clinical Summary Anthony Kelly is a 75 y.o.male seen today for follow up of the following medical problems.   1. PAF - followed by EP, started on flecanide    - some low heart rates and bp's at times, toprol was lowered - these problems have resolved since lowering toprol - compliant with eliquis, no bleeding    2. CAD - Coronary CT angiography demonstrated nonobstructive coronary artery disease in the LAD, left circumflex, and RCA. - 05/2019 cath: prox LAD 25%, ramus 50%, RCA 25% - 03/2019 TC 137 TG 75 HDL 61 LDL 61  - no recent chest pains.  - exercises regularly without troubles.   3. NSVT -no symptoms   4. Lymphoma - followed by oncology  SH: has had covid vaccine x 3 Past Medical History:  Diagnosis Date  . Acute pericarditis    a. 04/2013 -adm with CP, elevated CRP. H/o coronary artery calcification on prior CT but nuc was negative, EF 71%.  . Arthritis   . Atrial fibrillation (Anthony Kelly)    a. Isolated episode in the setting of acute pericarditis 04/2013. Was not placed on anticoag.  Marland Kitchen BPH (benign prostatic hyperplasia)   . Cataract   . Diverticulitis 08/16/2013  . Hyperlipidemia    elevated triglycerides  . Low grade B-cell lymphoma (Anthony Kelly) 05/11/2011   Initial dx 6/04 left inguinal adenopathy Rx observation; convert to hi grade 11/05 Rx CHOP-R; lesion right lung resected 12/08: low grade NHL; new lesion left submandibular gland 2/13  resected 04/30/11 lo grade NHL  . Malignant lymphoma, high grade (Anthony Kelly) 03/14/2011  . Metastasis to lung (Anthony Kelly) dx'd 01/2007  . Metastasis to lymph nodes (Anthony Kelly) dx'd 03/2011   lt submandibular ln  . Pain in joint, pelvic region and thigh 08/01/2013  . PSVT (paroxysmal supraventricular tachycardia) ([facility])      No Known Allergies   Current Outpatient Medications  Medication Sig Dispense Refill  . acyclovir (ZOVIRAX) 400 MG tablet Take 1 tablet (400 mg total) by mouth 2 (two) times daily. 180 tablet 6  . apixaban (ELIQUIS) 5 MG TABS tablet  Take 1 tablet (5 mg total) by mouth 2 (two) times daily. 180 tablet 2  . brimonidine (ALPHAGAN) 0.2 % ophthalmic solution Place 1 drop into both eyes 2 (two) times daily.     . cholecalciferol (VITAMIN D3) 25 MCG (1000 UNIT) tablet Take 1,000 Units by mouth daily.    . flecainide (TAMBOCOR) 50 MG tablet Take 1 tablet (50 mg total) by mouth 2 (two) times daily. 180 tablet 3  . LUMIGAN 0.01 % SOLN Place 1 drop into both eyes daily.    . meclizine (ANTIVERT) 25 MG tablet Take 1 tablet (25 mg total) by mouth 3 (three) times daily as needed for dizziness. 30 tablet 1  . metoprolol succinate (TOPROL XL) 25 MG 24 hr tablet Take 0.5 tablets (12.5 mg total) by mouth daily. 45 tablet 1  . Multiple Vitamin (MULTIVITAMIN WITH MINERALS) TABS tablet Take 1 tablet by mouth daily.     . NON FORMULARY Take 4 oz by mouth daily. Beet Root    . omeprazole (PRILOSEC) 20 MG capsule TAKE 1 CAPSULE DAILY 90 capsule 1  . Probiotic Product (PROBIOTIC DAILY PO) Take 1 capsule by mouth daily.     . prochlorperazine (COMPAZINE) 10 MG tablet Take 1 tablet (10 mg total) by mouth every 6 (six) hours as needed for nausea or vomiting. 30 tablet 2  . terazosin (HYTRIN) 2 MG capsule TAKE 1 CAPSULE  AT BEDTIME 90 capsule 3  . ZYDELIG 150 MG tablet TAKE 1 TABLET (150 MG TOTAL) BY MOUTH 2 (TWO) TIMES DAILY. 60 tablet 11  . rosuvastatin (CRESTOR) 20 MG tablet Take 1 tablet (20 mg total) by mouth daily. (Patient taking differently: Take 20 mg by mouth at bedtime. ) 90 tablet 3  . triamterene-hydrochlorothiazide (DYAZIDE) 37.5-25 MG capsule Take 1 each (1 capsule total) by mouth daily. 30 capsule 5   Current Facility-Administered Medications  Medication Dose Route Frequency Provider Last Rate Last Admin  . sodium chloride flush (NS) 0.9 % injection 3 mL  3 mL Intravenous Q12H [name], MD         Past Surgical History:  Procedure Laterality Date  . CHOLECYSTECTOMY    . EXPLORATORY LAPAROTOMY    . EYE SURGERY Right 2016    cataract  . LEFT HEART CATH AND CORONARY ANGIOGRAPHY N/A 06/05/2019   Procedure: LEFT HEART CATH AND CORONARY ANGIOGRAPHY;  Surgeon: Jettie Booze, MD;  Location: [facility];  Service: Cardiovascular;  Laterality: N/A;  . LUNG LOBECTOMY     right side  . LYMPH NODE BIOPSY     in groin with removal  . MENISCUS REPAIR     right knee  . PROSTATE SURGERY    . REFRACTIVE SURGERY Right    piece of metal removed  . SUBMANDIBULAR GLAND EXCISION  04/2011  . SUBMANDIBULAR GLAND EXCISION  04/30/2011   Procedure: EXCISION SUBMANDIBULAR GLAND;  Surgeon: Jerrell Belfast, MD;  Location: [facility];  Service: ENT;  Laterality: Left;  WITH DIAGNOSTIC BIOPSY  . TONSILLECTOMY     as a child  . VEIN LIGATION AND STRIPPING     right leg     No Known Allergies    Family History  Problem Relation Age of Onset  . Heart disease Father   . Heart attack Father        x 3  . Cancer Sister        breast ca  . Cancer Brother        prostate ca  . Heart attack Sister   . Cancer Sister        breast  . Heart disease Brother   . Alzheimer's disease Sister   . Diabetes Sister   . Heart disease Brother   . Diabetes Brother   . Heart disease Brother   . Heart disease Brother   . Heart disease Brother   . Heart disease Brother   . Heart disease Brother   . Alzheimer's disease Brother   . Diabetes Son   . Anesthesia problems Neg Hx      Social History [name] reports that he quit smoking about 30 years ago. His smoking use included cigarettes. He started smoking about 57 years ago. He has a 37.50 pack-year smoking history. He has never used smokeless tobacco. [name] reports no history of alcohol use.   Review of Systems CONSTITUTIONAL: No weight loss, fever, chills, weakness or fatigue.  HEENT: Eyes: No visual loss, blurred vision, double vision or yellow sclerae.No hearing loss, sneezing, congestion, runny nose or sore throat.  SKIN: No rash or itching.  CARDIOVASCULAR: per  hpi RESPIRATORY: No shortness of breath, cough or sputum.  GASTROINTESTINAL: No anorexia, nausea, vomiting or diarrhea. No abdominal pain or blood.  GENITOURINARY: No burning on urination, no polyuria NEUROLOGICAL: No headache, dizziness, syncope, paralysis, ataxia, numbness or tingling in the extremities. No change in bowel or bladder control.  MUSCULOSKELETAL: No muscle, back pain, joint pain or stiffness.  LYMPHATICS: No enlarged nodes. No history of splenectomy.  PSYCHIATRIC: No history of depression or anxiety.  ENDOCRINOLOGIC: No reports of sweating, cold or heat intolerance. No polyuria or polydipsia.  Marland Kitchen   Physical Examination There were no vitals filed for this visit. Filed Weights   02/13/20 0806  Weight: 195 lb 3.2 oz (88.5 kg)    Gen: resting comfortably, no acute distress HEENT: no scleral icterus, pupils equal round and reactive, no palptable cervical adenopathy,  CV: RRR, no mr/g, no jvd Resp: Clear to auscultation bilaterally GI: abdomen is soft, non-tender, non-distended, normal bowel sounds, no hepatosplenomegaly MSK: extremities are warm, no edema.  Skin: warm, no rash Neuro:  no focal deficits Psych: appropriate affect   Diagnostic Studies Coronary CT angiography 04/27/2019:  1. Left Main: No significant stenosis.  2. LAD: No significant stenosis. FFR 0.9 proximal, 0.84 distal 3. LCX: No significant stenosis. FFR 0.9 in the mid vessel, 0.84 distally 4. RCA: No significant stenosis. FFR 0.9 proximally, 0.87 mid, 0.80 distally  IMPRESSION: 1. CT FFR analysis didn't show any significant stenosis.   Echocardiogram 05/22/2019:  1. Left ventricular ejection fraction, by estimation, is 60 to 65%. The  left ventricle has normal function. The left ventricle has no regional  wall motion abnormalities. There is mild left ventricular hypertrophy.  Left ventricular diastolic parameters  are indeterminate.  2. Right ventricular systolic function is  normal. The right ventricular  size is mildly enlarged. Mildly increased right ventricular wall  thickness. Tricuspid regurgitation signal is inadequate for assessing PA  pressure.  3. Left atrial size was mildly dilated.  4. The mitral valve is grossly normal. Trivial mitral valve  regurgitation.  5. The aortic valve is tricuspid. Aortic valve regurgitation is not  visualized.  6. The inferior vena cava is normal in size with greater than 50%  respiratory variability, suggesting right atrial pressure of 3 mmHg.     Assessment and Plan   1. PAF - no symptoms, continue current meds - in the absence of clinically significant CAD have continued flecanide  2. CAD - mild disease by cath - no symptoms, continue current meds  3. NSVT - no recent symptoms, continue current meds   Arnoldo Lenis, M.D.

## 2020-02-13 NOTE — Patient Instructions (Signed)

## 2020-02-18 ENCOUNTER — Inpatient Hospital Stay: Payer: Medicare Other

## 2020-02-18 ENCOUNTER — Inpatient Hospital Stay: Payer: Medicare Other | Attending: Hematology and Oncology | Admitting: Hematology and Oncology

## 2020-02-18 ENCOUNTER — Encounter: Payer: Self-pay | Admitting: Hematology and Oncology

## 2020-02-18 ENCOUNTER — Other Ambulatory Visit: Payer: Self-pay

## 2020-02-18 DIAGNOSIS — Z9221 Personal history of antineoplastic chemotherapy: Secondary | ICD-10-CM | POA: Diagnosis not present

## 2020-02-18 DIAGNOSIS — C8280 Other types of follicular lymphoma, unspecified site: Secondary | ICD-10-CM

## 2020-02-18 DIAGNOSIS — C8295 Follicular lymphoma, unspecified, lymph nodes of inguinal region and lower limb: Secondary | ICD-10-CM | POA: Insufficient documentation

## 2020-02-18 DIAGNOSIS — R21 Rash and other nonspecific skin eruption: Secondary | ICD-10-CM | POA: Insufficient documentation

## 2020-02-18 DIAGNOSIS — Z79899 Other long term (current) drug therapy: Secondary | ICD-10-CM | POA: Diagnosis not present

## 2020-02-18 DIAGNOSIS — K5730 Diverticulosis of large intestine without perforation or abscess without bleeding: Secondary | ICD-10-CM | POA: Insufficient documentation

## 2020-02-18 DIAGNOSIS — N400 Enlarged prostate without lower urinary tract symptoms: Secondary | ICD-10-CM | POA: Insufficient documentation

## 2020-02-18 DIAGNOSIS — I2510 Atherosclerotic heart disease of native coronary artery without angina pectoris: Secondary | ICD-10-CM | POA: Diagnosis not present

## 2020-02-18 DIAGNOSIS — Z9225 Personal history of immunosupression therapy: Secondary | ICD-10-CM | POA: Insufficient documentation

## 2020-02-18 DIAGNOSIS — Z7901 Long term (current) use of anticoagulants: Secondary | ICD-10-CM | POA: Diagnosis not present

## 2020-02-18 LAB — CBC WITH DIFFERENTIAL/PLATELET
Abs Immature Granulocytes: 0.02 10*3/uL (ref 0.00–0.07)
Basophils Absolute: 0.1 10*3/uL (ref 0.0–0.1)
Basophils Relative: 1 %
Eosinophils Absolute: 0.7 10*3/uL — ABNORMAL HIGH (ref 0.0–0.5)
Eosinophils Relative: 10 %
HCT: 46.4 % (ref 39.0–52.0)
Hemoglobin: 15.9 g/dL (ref 13.0–17.0)
Immature Granulocytes: 0 %
Lymphocytes Relative: 23 %
Lymphs Abs: 1.8 10*3/uL (ref 0.7–4.0)
MCH: 31.1 pg (ref 26.0–34.0)
MCHC: 34.3 g/dL (ref 30.0–36.0)
MCV: 90.6 fL (ref 80.0–100.0)
Monocytes Absolute: 0.6 10*3/uL (ref 0.1–1.0)
Monocytes Relative: 8 %
Neutro Abs: 4.5 10*3/uL (ref 1.7–7.7)
Neutrophils Relative %: 58 %
Platelets: 163 10*3/uL (ref 150–400)
RBC: 5.12 MIL/uL (ref 4.22–5.81)
RDW: 12.4 % (ref 11.5–15.5)
WBC: 7.8 10*3/uL (ref 4.0–10.5)
nRBC: 0 % (ref 0.0–0.2)

## 2020-02-18 LAB — COMPREHENSIVE METABOLIC PANEL
ALT: 23 U/L (ref 0–44)
AST: 22 U/L (ref 15–41)
Albumin: 4.1 g/dL (ref 3.5–5.0)
Alkaline Phosphatase: 59 U/L (ref 38–126)
Anion gap: 9 (ref 5–15)
BUN: 12 mg/dL (ref 8–23)
CO2: 26 mmol/L (ref 22–32)
Calcium: 9.2 mg/dL (ref 8.9–10.3)
Chloride: 106 mmol/L (ref 98–111)
Creatinine, Ser: 1.15 mg/dL (ref 0.61–1.24)
GFR, Estimated: >60 mL/min (ref >60)
Glucose, Bld: 80 mg/dL (ref 70–99)
Potassium: 4.5 mmol/L (ref 3.5–5.1)
Sodium: 141 mmol/L (ref 135–145)
Total Bilirubin: 0.7 mg/dL (ref 0.3–1.2)
Total Protein: 6.6 g/dL (ref 6.5–8.1)

## 2020-02-18 NOTE — Progress Notes (Signed)
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[facility]) His last CT in October 2021 showed excellent response to therapy Overall, he tolerated treatment fairly well The plan would be to continue treatment indefinitely I plan to space out the interval between his imaging studies With lack of symptoms and completely normal blood count, I recommend only an annual imaging study unless he has signs and symptoms to suggest disease progression I will see him every 3 months, his next appointment will be in May  Skin rash He is bothered by his skin lesions on his face On exam, he appears most consistent with seborrheic keratosis The patient is informed that he is at risk of skin cancer given his age and his ongoing chemotherapy and lymphoma He will continue close monitoring and follow-up with dermatologist   No orders of the defined types were placed in this encounter.   All questions were answered. The patient knows to call the clinic with any problems, [name], [name], MD 02/18/2020 12:58 PM  INTERVAL HISTORY: Please see below for problem oriented charting. He returns with his wife for further follow-up He is doing well He tolerated chemotherapy well No recent infection,  fever or chills He is started on multiple different topical treatment by dermatologist for skin rashes No recent new lymphadenopathy  SUMMARY OF ONCOLOGIC HISTORY: Oncology History Overview Note  Low grade B-cell lymphoma   Primary site: Lymphoid Neoplasms   Staging method: AJCC 6th Edition   Clinical: Stage IV signed by [name], MD on 09/26/2013  9:15 AM   Summary: Stage IV      Follicular low grade B-cell lymphoma ([place])  12/10/2003 Surgery   Inguinal lymph node biopsy showed follicular lymphoma.   12/12/2003 - 06/01/2004 Chemotherapy   He was treated with R. CHOP chemotherapy which show complete remission. The number of cycles of R. CHOP chemotherapy was unknown.   12/19/2006 Surgery   Lung resection show follicular lymphoma.   01/02/2007 - 09/01/2008 Chemotherapy   The patient was treated with single agent rituximab alone.   01/19/2007 Bone Marrow Biopsy   Bone marrow biopsy was negative.   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   05/08/2013 - 05/11/2013 Hospital Admission   The patient was admitted to the hospital for management of pericarditis. CT scan showed extensive lymphadenopathy.   06/07/2013 Imaging   PET/CT scan showed extensive lymphadenopathy   06/25/2013 Procedure   He has placement of Infuse-a-Port.   06/28/2013 Bone Marrow Biopsy   Bone marrow biopsy is positive for lymphoma involvement.   07/04/2013 - 11/22/2013 Chemotherapy   He is treated with 6 cycles of bendamustine with rituximab.   09/24/2013 Imaging   Repeat PET scan show complete remission.   12/26/2013 Imaging   PEt scan showed complete remission   09/26/2014 Imaging   CT scan of the chest abdomen  and pelvis show no evidence of disease   03/26/2015 Imaging   CT scan showed no evidence of lymphoma   04/14/2016 Imaging   CT: Borderline prominent right hilar and subcarinal lymph nodes, but not appreciably changed. 2. Low-grade but increased central  mesenteric stranding with some small mesenteric lymph nodes. This could certainly be inflammatory, 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, [name], volume estimated at 75 cubic cm.   02/15/2017 Imaging   No evidence of recurrent lymphoma or other acute findings.  Stable moderate hiatal hernia.  Colonic diverticulosis, without radiographic evidence of diverticulitis.  Stable mildly enlarged prostate.  Mild emphysema.  Aortic and coronary artery atherosclerosis.   01/31/2019 Imaging   1. Interval development of abdominal and pelvic adenopathy compatible with recurrent lymphoma. 2. Emphysema and aortic atherosclerosis. 3. Multi vessel coronary artery calcifications. 4. Moderate to large hiatal hernia   02/09/2019 Procedure   Successful CT-guided core biopsy of the left retroperitoneal periaortic adenopathy   02/09/2019 Pathology Results   SURGICAL PATHOLOGY  CASE: [id number]  PATIENT: [name]  Surgical Pathology Report   Clinical History: Lymphoma; [name], LEFT PARA AORTIC, [name], cleaved nuclei and scant cytoplasm.  By immunohistochemistry, the lymphocytes are  predominantly B cells which are positive for CD20, [name], [name], [name] is low (less than 10%).  Flow cytometry was attempted; however, there was insufficient material for analysis (see [id number]).  Overall, the features are consistent with relapse of the patient's previously diagnosed follicular lymphoma.  Based on the biopsy, this is favored to be a low-grade follicular lymphoma   08/16/9290 -  Chemotherapy   The patient had idelisib for chemotherapy treatment.     05/17/2019 Imaging   1. Interval generalized decrease in abdominopelvic lymphadenopathy identified on the previous study. No new or progressive lymphadenopathy on today's study. 2. Moderate hiatal hernia. 3.  Emphysema (ICD10-J43.9) and Aortic Atherosclerosis (ICD10-170.0)   11/16/2019 Imaging   IMPRESSION: Chest Impression:   1. No mediastinal lymphadenopathy. 2. Post RIGHT lung wedge resection without evidence local recurrence.   Abdomen / Pelvis Impression:   1. Stable numerous small periaortic retroperitoneal nodes and common iliac nodes. No progression of adenopathy. 2. No splenomegaly.  No skeletal metastasis.       REVIEW OF SYSTEMS:   Constitutional: Denies fevers, chills or abnormal weight loss Eyes: Denies blurriness of vision Ears, nose, mouth, throat, and face: Denies mucositis or sore throat Respiratory: Denies cough, dyspnea or wheezes Cardiovascular: Denies palpitation, chest discomfort or lower extremity swelling Gastrointestinal:  Denies nausea, heartburn or change in bowel habits Lymphatics: Denies new lymphadenopathy or easy bruising Neurological:Denies numbness, tingling or new  weaknesses Behavioral/Psych: Mood is stable, no new changes  All other systems were reviewed with the patient and are negative.  I have reviewed the past medical history,  past surgical history, social history and family history with the patient and they are unchanged from previous note.  ALLERGIES:  has No Known Allergies.  MEDICATIONS:  Current Outpatient Medications  Medication Sig Dispense Refill  . clobetasol (OLUX) 0.05 % topical foam Apply 1 application topically 2 (two) times daily.    Marland Kitchen triamcinolone (KENALOG) 0.025 % cream Apply 1 application topically 2 (two) times daily.    Marland Kitchen triamcinolone (KENALOG) 0.1 % Apply 1 application topically 2 (two) times daily.    Marland Kitchen acyclovir (ZOVIRAX) 400 MG tablet Take 1 tablet (400 mg total) by mouth 2 (two) times daily. 180 tablet 6  . apixaban (ELIQUIS) 5 MG TABS tablet Take 1 tablet (5 mg total) by mouth 2 (two) times daily. 180 tablet 2  . brimonidine (ALPHAGAN) 0.2 % ophthalmic solution Place 1 drop into both eyes 2 (two) times daily.     . cholecalciferol (VITAMIN D3) 25 MCG (1000 UNIT) tablet Take 1,000 Units by mouth daily.    . flecainide (TAMBOCOR) 50 MG tablet Take 1 tablet (50 mg total) by mouth 2 (two) times daily. 180 tablet 3  . LUMIGAN 0.01 % SOLN Place 1 drop into both eyes daily.    . meclizine (ANTIVERT) 25 MG tablet Take 1 tablet (25 mg total) by mouth 3 (three) times daily as needed for dizziness. 30 tablet 1  . metoprolol succinate (TOPROL XL) 25 MG 24 hr tablet Take 0.5 tablets (12.5 mg total) by mouth daily. 45 tablet 1  . Multiple Vitamin (MULTIVITAMIN WITH MINERALS) TABS tablet Take 1 tablet by mouth daily.     . NON FORMULARY Take 4 oz by mouth daily. Beet Root    . omeprazole (PRILOSEC) 20 MG capsule TAKE 1 CAPSULE DAILY 90 capsule 1  . Probiotic Product (PROBIOTIC DAILY PO) Take 1 capsule by mouth daily.     . prochlorperazine (COMPAZINE) 10 MG tablet Take 1 tablet (10 mg total) by mouth every 6 (six) hours as needed for nausea or vomiting. 30 tablet 2  . rosuvastatin (CRESTOR) 20 MG tablet Take 20 mg by mouth daily.    Marland Kitchen terazosin (HYTRIN) 2 MG capsule TAKE 1 CAPSULE AT BEDTIME 90 capsule  3  . ZYDELIG 150 MG tablet TAKE 1 TABLET (150 MG TOTAL) BY MOUTH 2 (TWO) TIMES DAILY. 60 tablet 11   Current Facility-Administered Medications  Medication Dose Route Frequency Provider Last Rate Last Admin  . sodium chloride flush (NS) 0.9 % injection 3 mL  3 mL Intravenous Q12H [name], MD        PHYSICAL EXAMINATION: ECOG PERFORMANCE STATUS: 1 - Symptomatic but completely ambulatory  Vitals:   02/18/20 1003  BP: 112/72  Pulse: 77  Resp: 18  Temp: 97.6 F (36.4 C)  SpO2: 94%   Filed Weights   02/18/20 1003  Weight: 197 lb (89.4 kg)    GENERAL:alert, no distress and comfortable SKIN: Noted multiple skin lesions most consistent with seborrheic keratosis EYES: normal, Conjunctiva are pink and non-injected, sclera clear OROPHARYNX:no exudate, no erythema and lips, buccal mucosa, and tongue normal  NECK: supple, thyroid normal size, non-tender, without nodularity LYMPH:  no palpable lymphadenopathy in the cervical, axillary or inguinal LUNGS: clear to auscultation and percussion with normal breathing effort HEART: regular rate & rhythm and no murmurs and no lower extremity edema  ABDOMEN:abdomen soft, non-tender and normal bowel sounds Musculoskeletal:no cyanosis of digits and no clubbing  NEURO: alert & oriented x 3 with fluent speech, no focal motor/sensory deficits  LABORATORY DATA:  I have reviewed the data as listed    Component Value Date/Time   NA 141 02/18/2020 0942   NA 140 09/27/2019 0801   NA 139 04/14/2016 0943   K 4.5 02/18/2020 0942   K 4.6 04/14/2016 0943   CL 106 02/18/2020 0942   CL 104 03/24/2012 1024   CO2 26 02/18/2020 0942   CO2 25 04/14/2016 0943   GLUCOSE 80 02/18/2020 0942   GLUCOSE 111 04/14/2016 0943   GLUCOSE 80 03/24/2012 1024   BUN 12 02/18/2020 0942   BUN 15 09/27/2019 0801   BUN 8.6 04/14/2016 0943   CREATININE 1.15 02/18/2020 0942   CREATININE 1.1 04/14/2016 0943   CALCIUM 9.2 02/18/2020 0942   CALCIUM 9.4 04/14/2016  0943   PROT 6.6 02/18/2020 0942   PROT 6.4 12/27/2019 1143   PROT 6.5 04/14/2016 0943   ALBUMIN 4.1 02/18/2020 0942   ALBUMIN 4.4 09/27/2019 0801   ALBUMIN 3.8 04/14/2016 0943   AST 22 02/18/2020 0942   AST 28 04/14/2016 0943   ALT 23 02/18/2020 0942   ALT 41 04/14/2016 0943   ALKPHOS 59 02/18/2020 0942   ALKPHOS 71 04/14/2016 0943   BILITOT 0.7 02/18/2020 0942   BILITOT 0.5 09/27/2019 0801   BILITOT 0.61 04/14/2016 0943   GFRNONAA >60 02/18/2020 0942   GFRAA 69 09/27/2019 0801    No results found for: SPEP, UPEP  Lab Results  Component Value Date   WBC 7.8 02/18/2020   NEUTROABS 4.5 02/18/2020   HGB 15.9 02/18/2020   HCT 46.4 02/18/2020   MCV 90.6 02/18/2020   PLT 163 02/18/2020      Chemistry      Component Value Date/Time   NA 141 02/18/2020 0942   NA 140 09/27/2019 0801   NA 139 04/14/2016 0943   K 4.5 02/18/2020 0942   K 4.6 04/14/2016 0943   CL 106 02/18/2020 0942   CL 104 03/24/2012 1024   CO2 26 02/18/2020 0942   CO2 25 04/14/2016 0943   BUN 12 02/18/2020 0942   BUN 15 09/27/2019 0801   BUN 8.6 04/14/2016 0943   CREATININE 1.15 02/18/2020 0942   CREATININE 1.1 04/14/2016 0943      Component Value Date/Time   CALCIUM 9.2 02/18/2020 0942   CALCIUM 9.4 04/14/2016 0943   ALKPHOS 59 02/18/2020 0942   ALKPHOS 71 04/14/2016 0943   AST 22 02/18/2020 0942   AST 28 04/14/2016 0943   ALT 23 02/18/2020 0942   ALT 41 04/14/2016 0943   BILITOT 0.7 02/18/2020 0942   BILITOT 0.5 09/27/2019 0801   BILITOT 0.61 04/14/2016 9292

## 2020-02-18 NOTE — Assessment & Plan Note (Signed)
His last CT in October 2021 showed excellent response to therapy Overall, he tolerated treatment fairly well The plan would be to continue treatment indefinitely I plan to space out the interval between his imaging studies With lack of symptoms and completely normal blood count, I recommend only an annual imaging study unless he has signs and symptoms to suggest disease progression I will see him every 3 months, his next appointment will be in May

## 2020-02-18 NOTE — Assessment & Plan Note (Signed)
He is bothered by his skin lesions on his face On exam, he appears most consistent with seborrheic keratosis The patient is informed that he is at risk of skin cancer given his age and his ongoing chemotherapy and lymphoma He will continue close monitoring and follow-up with dermatologist

## 2020-02-19 DIAGNOSIS — L209 Atopic dermatitis, unspecified: Secondary | ICD-10-CM | POA: Diagnosis not present

## 2020-02-19 DIAGNOSIS — L820 Inflamed seborrheic keratosis: Secondary | ICD-10-CM | POA: Diagnosis not present

## 2020-02-19 DIAGNOSIS — L821 Other seborrheic keratosis: Secondary | ICD-10-CM | POA: Diagnosis not present

## 2020-02-19 DIAGNOSIS — D1801 Hemangioma of skin and subcutaneous tissue: Secondary | ICD-10-CM | POA: Diagnosis not present

## 2020-02-19 DIAGNOSIS — L270 Generalized skin eruption due to drugs and medicaments taken internally: Secondary | ICD-10-CM | POA: Diagnosis not present

## 2020-02-19 DIAGNOSIS — L219 Seborrheic dermatitis, unspecified: Secondary | ICD-10-CM | POA: Diagnosis not present

## 2020-02-19 DIAGNOSIS — L570 Actinic keratosis: Secondary | ICD-10-CM | POA: Diagnosis not present

## 2020-02-25 ENCOUNTER — Ambulatory Visit (INDEPENDENT_AMBULATORY_CARE_PROVIDER_SITE_OTHER): Payer: Medicare Other | Admitting: *Deleted

## 2020-02-25 DIAGNOSIS — Z0000 Encounter for general adult medical examination without abnormal findings: Secondary | ICD-10-CM

## 2020-02-25 NOTE — Patient Instructions (Signed)
  [facility] Maintenance Summary and Written Plan of Care  [name] ,  Thank you for allowing me to perform your Medicare Annual Wellness Visit and for your ongoing commitment to your health.   Health Maintenance & Immunization History Health Maintenance  Topic Date Due  . HEMOGLOBIN A1C  09/22/2019  . COVID-19 Vaccine (4 - Booster for Moderna series) 06/17/2020  . OPHTHALMOLOGY EXAM  07/01/2020  . TETANUS/TDAP  08/10/2020  . COLONOSCOPY (Pts 45-30yrs Insurance coverage will need to be confirmed)  02/23/2024  . INFLUENZA VACCINE  Completed  . Hepatitis C Screening  Completed  . PNA vac Low Risk Adult  Completed  . FOOT EXAM  Discontinued   Immunization History  Administered Date(s) Administered  . Influenza, High Dose Seasonal PF 10/12/2016, 10/01/2017  . Influenza,inj,Quad PF,6+ Mos 09/27/2014, 10/16/2015  . Influenza-Unspecified 09/11/2012, 11/11/2013, 10/12/2016, 09/12/2018  . Moderna Sars-Covid-2 Vaccination 04/11/2019, 05/07/2019, 12/18/2019  . Pneumococcal Conjugate-13 03/07/2013  . Pneumococcal Polysaccharide-23 09/25/2012  . Tdap 08/11/2010  . Zoster Recombinat (Shingrix) 02/17/2018, 08/05/2018    These are the patient goals that we discussed: Goals Addressed            This Visit's Progress   . AWV       02/25/2020 AWV Goal: Fall Prevention  . Over the next year, patient will decrease their risk for falls by: o Using assistive devices, such as a cane or walker, as needed o Identifying fall risks within their home and correcting them by: - Removing throw rugs - Adding handrails to stairs or ramps - Removing clutter and keeping a clear pathway throughout the home - Increasing light, especially at night - Adding shower handles/bars - Raising toilet seat o Identifying potential personal risk factors for falls: - Medication side effects - Incontinence/urgency - Vestibular dysfunction - Hearing loss - Musculoskeletal  disorders - Neurological disorders - Orthostatic hypotension          This is a list of Health Maintenance Items that are overdue or due now: Health Maintenance Due  Topic Date Due  . HEMOGLOBIN A1C  09/22/2019     Orders/Referrals Placed Today: No orders of the defined types were placed in this encounter.  (Contact our referral department at [phone] if you have not spoken with someone about your referral appointment within the next 5 days)    Follow-up Plan Follow-up with Dettinger, [name], MD as planned

## 2020-02-25 NOTE — Progress Notes (Signed)
MEDICARE ANNUAL WELLNESS VISIT  02/25/2020  Telephone Visit Disclaimer This Medicare AWV was conducted by telephone due to national recommendations for restrictions regarding the COVID-19 Pandemic (e.g. social distancing).  I verified, using two identifiers, that I am speaking with Anthony Kelly or their authorized healthcare agent. I discussed the limitations, risks, security, and privacy concerns of performing an evaluation and management service by telephone and the potential availability of an in-person appointment in the future. The patient expressed understanding and agreed to proceed.  Location of Patient: Home Location of Provider (nurse):  [facility]  Subjective:    Anthony Kelly is a 75 y.o. male patient of Anthony Kelly who had a Medicare Annual Wellness Visit today via telephone. [name] is Retired and lives with their spouse. he has 2 children. he reports that he is socially active and does interact with friends/family regularly. he is minimally physically active and enjoys working on his cars and tractors.  Patient Care Team: Anthony Kelly as PCP - General (Family Medicine) Anthony Kelly as PCP - Cardiology (Cardiology) Anthony Kelly as PCP - Electrophysiology (Cardiology) Anthony Kelly as Consulting Physician (Gastroenterology) Anthony Kelly (Inactive) as Consulting Physician (Cardiology) Anthony Kelly as Consulting Physician (Hematology and Oncology) Anthony Kelly as Consulting Physician (Dermatology)  Advanced Directives 02/25/2020 06/05/2019 03/28/2019 02/21/2019 02/09/2019 02/17/2018 10/16/2015  Does Patient Have a Medical Advance Directive? Yes Yes No Yes Yes Yes Yes  Type of Paramedic of [place];Living will [place];Living will - [place];Living will [place];Living will [place];Living  will [place];Living will  Does patient want to make changes to medical advance directive? No - Patient declined - - No - Patient declined - No - Patient declined No - Patient declined  Copy of Healthcare Power of Attorney in Chart? - - - No - copy requested No - copy requested No - copy requested Yes  Would patient like information on creating a medical advance directive? - - - - - - -  Pre-existing out of facility DNR order (yellow form or pink MOST form) - - - - - - -    Hospital Utilization Over the Past 12 Months: # of hospitalizations or ER visits: 0 # of surgeries: 0  Review of Systems    Patient reports that his overall health is better compared to last year. Patient states that cancer is 50% in remission.  History obtained from chart review and the patient  Patient Reported Readings (BP, Pulse, CBG, Weight, etc) none  Pain Assessment Pain : No/denies pain     Current Medications & Allergies (verified) Allergies as of 02/25/2020   No Known Allergies     Medication List       Accurate as of February 25, 2020 10:59 AM. If you have any questions, ask your nurse or doctor.        acyclovir 400 MG tablet Commonly known as: ZOVIRAX Take 1 tablet (400 mg total) by mouth 2 (two) times daily.   apixaban 5 MG Tabs tablet Commonly known as: Eliquis Take 1 tablet (5 mg total) by mouth 2 (two) times daily.   brimonidine 0.2 % ophthalmic solution Commonly known as: ALPHAGAN Place 1 drop into both eyes 2 (two) times daily.   cholecalciferol 25 MCG (1000 UNIT) tablet Commonly known as: VITAMIN D3 Take 1,000 Units by mouth daily.  clobetasol 0.05 % topical foam Commonly known as: OLUX Apply 1 application topically 2 (two) times daily.   flecainide 50 MG tablet Commonly known as: TAMBOCOR Take 1 tablet (50 mg total) by mouth 2 (two) times daily.   Lumigan 0.01 % Soln Generic drug: bimatoprost Place 1 drop into both eyes daily.   meclizine 25  MG tablet Commonly known as: ANTIVERT Take 1 tablet (25 mg total) by mouth 3 (three) times daily as needed for dizziness.   metoprolol succinate 25 MG 24 hr tablet Commonly known as: Toprol XL Take 0.5 tablets (12.5 mg total) by mouth daily.   multivitamin with minerals Tabs tablet Take 1 tablet by mouth daily.   NON FORMULARY Take 4 oz by mouth daily. Beet Root   omeprazole 20 MG capsule Commonly known as: PRILOSEC TAKE 1 CAPSULE DAILY What changed:   how much to take  how to take this  when to take this  additional instructions   PROBIOTIC DAILY PO Take 1 capsule by mouth daily.   prochlorperazine 10 MG tablet Commonly known as: COMPAZINE Take 1 tablet (10 mg total) by mouth every 6 (six) hours as needed for nausea or vomiting.   rosuvastatin 20 MG tablet Commonly known as: CRESTOR Take 20 mg by mouth daily.   terazosin 2 MG capsule Commonly known as: HYTRIN TAKE 1 CAPSULE AT BEDTIME   triamcinolone 0.1 % Commonly known as: KENALOG Apply 1 application topically 2 (two) times daily.   triamcinolone 0.025 % cream Commonly known as: KENALOG Apply 1 application topically 2 (two) times daily.   Zydelig 150 MG tablet Generic drug: idelalisib TAKE 1 TABLET (150 MG TOTAL) BY MOUTH 2 (TWO) TIMES DAILY.       History (reviewed): Past Medical History:  Diagnosis Date  . Acute pericarditis    a. 04/2013 -adm with CP, elevated CRP. H/o coronary artery calcification on prior CT but nuc was negative, EF 71%.  . Arthritis   . Atrial fibrillation ([place])    a. Isolated episode in the setting of acute pericarditis 04/2013. Was not placed on anticoag.  Marland Kitchen BPH (benign prostatic hyperplasia)   . Cataract   . Diverticulitis 08/16/2013  . Glaucoma   . Hyperlipidemia    elevated triglycerides  . 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 Pain in joint, pelvic region and thigh 08/01/2013  . PSVT (paroxysmal supraventricular tachycardia) ([place])    Past Surgical History:  Procedure Laterality Date  . CHOLECYSTECTOMY    . EXPLORATORY LAPAROTOMY    . EYE SURGERY Right 2016   cataract  . LEFT HEART CATH AND CORONARY ANGIOGRAPHY N/A 06/05/2019   Procedure: LEFT HEART CATH AND CORONARY ANGIOGRAPHY;  Surgeon: Jettie Booze, Kelly;  Location: [facility];  Service: Cardiovascular;  Laterality: N/A;  . LUNG LOBECTOMY     right side  . LYMPH NODE BIOPSY     in groin with removal  . MENISCUS REPAIR     right knee  . PROSTATE SURGERY    . REFRACTIVE SURGERY Right    piece of metal removed  . SUBMANDIBULAR  GLAND EXCISION  04/2011  . SUBMANDIBULAR GLAND EXCISION  04/30/2011   Procedure: EXCISION SUBMANDIBULAR GLAND;  Surgeon: Jerrell Belfast, Kelly;  Location: [facility];  Service: ENT;  Laterality: Left;  WITH DIAGNOSTIC BIOPSY  . TONSILLECTOMY     as a child  . VEIN LIGATION AND STRIPPING     right leg   Family History  Problem Relation Age of Onset  . Heart disease Father   . Heart attack Father        x 3  . Cancer Sister        breast ca  . Cancer Brother        prostate ca  . Heart attack Sister   . Cancer Sister        breast  . Heart disease Brother   . Alzheimer's disease Sister   . Diabetes Sister   . Heart disease Brother   . Diabetes Brother   . Heart disease Brother   . Heart disease Brother   . Heart disease Brother   . Heart disease Brother   . Heart disease Brother   . Alzheimer's disease Brother   . Diabetes Son   . Anesthesia problems Neg Hx    Social History   Socioeconomic History  . Marital status: Married    Spouse name: [name]  . Number of  children: 2  . Years of education: GED  . Highest education level: GED or equivalent  Occupational History  . Occupation: Retired     Comment: Secretary/administrator  . Occupation: Retired     Comment: [employer]  Tobacco Use  . Smoking status: Former Smoker    Packs/day: 1.50    Years: 25.00    Pack years: 37.50    Types: Cigarettes    Start date: 12/12/1962    Quit date: 01/11/1990    Years since quitting: 30.1  . Smokeless tobacco: Never Used  Vaping Use  . Vaping Use: Never used  Substance and Sexual Activity  . Alcohol use: No    Alcohol/week: 0.0 standard drinks  . Drug use: No  . Sexual activity: Not Currently  Other Topics Concern  . Not on file  Social History Narrative   Right handed   Decaf coffee (2-3 per day)   Lives with wife   Social Determinants of Health   Financial Resource Strain: Not on file  Food Insecurity: Not on file  Transportation Needs: Not on file  Physical Activity: Not on file  Stress: Not on file  Social Connections: Not on file    Activities of Daily Living In your present state of health, do you have any difficulty performing the following activities: 02/25/2020  Hearing? N  Vision? N  Difficulty concentrating or making decisions? Y  Comment Remebering  Walking or climbing stairs? N  Dressing or bathing? N  Doing errands, shopping? N  Preparing Food and eating ? N  Using the Toilet? N  In the past six months, have you accidently leaked urine? Y  Comment At times  Do you have problems with loss of bowel control? N  Managing your Medications? N  Managing your Finances? N  Housekeeping or managing your Housekeeping? N  Some recent data might be hidden    Patient Education/ Literacy How often do you need to have someone help you when you read instructions, pamphlets, or other written materials from your doctor or pharmacy?: 1 - Never What is the last grade level you completed in school?: 12th  Exercise Current Exercise Habits:  Home exercise routine, Type of exercise: strength training/weights, Time (Minutes): 60, Frequency (Times/Week): 3, Weekly Exercise (Minutes/Week): 180, Intensity: Mild  Diet Patient reports consuming 2 meals a day and 1 snack(s) a day Patient reports that his primary diet is: Regular Patient reports that she does have regular access to food.   Depression Screen PHQ 2/9 Scores 02/25/2020 10/01/2019 03/26/2019 02/21/2019 07/17/2018 02/17/2018 01/17/2018  PHQ - 2 Score 0 0 0 0 0 0 0     Fall Risk Fall Risk  02/25/2020 10/01/2019 03/26/2019 02/21/2019 07/17/2018  Falls in the past year? 0 0 0 0 0  Number falls in past yr: 0 - - - -  Injury with Fall? 0 - - - -  Risk for fall due to : No Fall Risks - - - -  Follow up Falls evaluation completed - - - -     Objective:  Anthony Kelly seemed alert and oriented and he participated appropriately during our telephone visit.  Blood Pressure Weight BMI  BP Readings from Last 3 Encounters:  02/18/20 112/72  02/13/20 124/80  12/27/19 107/62   Wt Readings from Last 3 Encounters:  02/18/20 197 lb (89.4 kg)  02/13/20 195 lb 3.2 oz (88.5 kg)  12/27/19 197 lb 8 oz (89.6 kg)   BMI Readings from Last 1 Encounters:  02/18/20 26.72 kg/m    *Unable to obtain current vital signs, weight, and BMI due to telephone visit type  Hearing/Vision  . [place] did not seem to have difficulty with hearing/understanding during the telephone conversation . Reports that he has had a formal eye exam by an eye care professional within the past year . Reports that he has not had a formal hearing evaluation within the past year *Unable to fully assess hearing and vision during telephone visit type  Cognitive Function: 6CIT Screen 02/25/2020 02/21/2019  What Year? 0 points 0 points  What month? - 0 points  What time? 0 points 0 points  Count back from 20 0 points 0 points  Months in reverse 4 points 4 points  Repeat phrase 0 points 2 points  Total Score - 6   (Normal:0-7,  Significant for Dysfunction: >8)  Normal Cognitive Function Screening: Yes   Immunization & Health Maintenance Record Immunization History  Administered Date(s) Administered  . Influenza, High Dose Seasonal PF 10/12/2016, 10/01/2017  . Influenza,inj,Quad PF,6+ Mos 09/27/2014, 10/16/2015  . Influenza-Unspecified 09/11/2012, 11/11/2013, 10/12/2016, 09/12/2018  . Moderna Sars-Covid-2 Vaccination 04/11/2019, 05/07/2019, 12/18/2019  . Pneumococcal Conjugate-13 03/07/2013  . Pneumococcal Polysaccharide-23 09/25/2012  . Tdap 08/11/2010  . Zoster Recombinat (Shingrix) 02/17/2018, 08/05/2018    Health Maintenance  Topic Date Due  . HEMOGLOBIN A1C  09/22/2019  . COVID-19 Vaccine (4 - Booster for Moderna series) 06/17/2020  . OPHTHALMOLOGY EXAM  07/01/2020  . TETANUS/TDAP  08/10/2020  . COLONOSCOPY (Pts 45-73yrs Insurance coverage will need to be confirmed)  02/23/2024  . INFLUENZA VACCINE  Completed  . Hepatitis C Screening  Completed  . PNA vac Low Risk Adult  Completed  . FOOT EXAM  Discontinued       Assessment  This is a routine wellness examination for Anthony Kelly.  Health Maintenance: Due or Overdue Health Maintenance Due  Topic Date Due  . HEMOGLOBIN A1C  09/22/2019    Anthony Kelly does not need a referral for Community Assistance: Care Management:   no Social Work:    no Prescription Assistance:  no Nutrition/Diabetes Education:  no   Plan:  Personalized Goals Goals Addressed            This Visit's Progress   . AWV       02/25/2020 AWV Goal: Fall Prevention  . Over the next year, patient will decrease their risk for falls by: o Using assistive devices, such as a cane or walker, as needed o Identifying fall risks within their home and correcting them by: - Removing throw rugs - Adding handrails to stairs or ramps - Removing clutter and keeping a clear pathway throughout the home - Increasing light, especially at night - Adding shower  handles/bars - Raising toilet seat o Identifying potential personal risk factors for falls: - Medication side effects - Incontinence/urgency - Vestibular dysfunction - Hearing loss - Musculoskeletal disorders - Neurological disorders - Orthostatic hypotension        Personalized Health Maintenance & Screening Recommendations  HgbA1C  Lung Cancer Screening Recommended: no (Low Dose CT Chest recommended if Age 37-80 years, 30 pack-year currently smoking OR have quit w/in past 15 years) Hepatitis C Screening recommended: no HIV Screening recommended: no  Advanced Directives: Written information was not prepared per patient's request.  Referrals & Orders No orders of the defined types were placed in this encounter.   Follow-up Plan . Follow-up with Anthony Kelly as planned . AVS printed and mailed to patient     I have personally reviewed and noted the following in the patient's chart:   . Medical and social history . Use of alcohol, tobacco or illicit drugs  . Current medications and supplements . Functional ability and status . Nutritional status . Physical activity . Advanced directives . List of other physicians . Hospitalizations, surgeries, and ER visits in previous 12 months . Vitals . Screenings to include cognitive, depression, and falls . Referrals and appointments  In addition, I have reviewed and discussed with Anthony Kelly certain preventive protocols, quality metrics, and best practice recommendations. A written personalized care plan for preventive services as well as general preventive health recommendations is available and can be mailed to the patient at his request.      Lynnea Ferrier, LPN  [id number]/93/9030

## 2020-03-12 MED FILL — ZYDELIG 150 MG TABLET: 150 | 30 days supply | Qty: 60 | Fill #1

## 2020-03-31 ENCOUNTER — Encounter: Payer: Self-pay | Admitting: Family Medicine

## 2020-03-31 ENCOUNTER — Other Ambulatory Visit: Payer: Self-pay | Admitting: *Deleted

## 2020-03-31 ENCOUNTER — Other Ambulatory Visit: Payer: Self-pay

## 2020-03-31 ENCOUNTER — Ambulatory Visit (INDEPENDENT_AMBULATORY_CARE_PROVIDER_SITE_OTHER): Payer: Medicare Other | Admitting: Family Medicine

## 2020-03-31 VITALS — BP 111/74 | HR 63 | Ht 72.0 in | Wt 193.0 lb

## 2020-03-31 DIAGNOSIS — I2510 Atherosclerotic heart disease of native coronary artery without angina pectoris: Secondary | ICD-10-CM

## 2020-03-31 DIAGNOSIS — I480 Paroxysmal atrial fibrillation: Secondary | ICD-10-CM

## 2020-03-31 DIAGNOSIS — R7303 Prediabetes: Secondary | ICD-10-CM

## 2020-03-31 DIAGNOSIS — E782 Mixed hyperlipidemia: Secondary | ICD-10-CM

## 2020-03-31 DIAGNOSIS — M79622 Pain in left upper arm: Secondary | ICD-10-CM

## 2020-03-31 DIAGNOSIS — K219 Gastro-esophageal reflux disease without esophagitis: Secondary | ICD-10-CM

## 2020-03-31 LAB — BAYER DCA HB A1C WAIVED: HB A1C (BAYER DCA - WAIVED): 5.7 % (ref <7.0)

## 2020-03-31 MED ORDER — ROSUVASTATIN CALCIUM 20 MG PO TABS
20.0000 mg | ORAL_TABLET | Freq: Every day | ORAL | 3 refills | Status: DC
Start: 2020-03-31 — End: 2021-03-03

## 2020-03-31 NOTE — Progress Notes (Signed)
BP 111/74   Pulse 63   Ht 6' (1.829 m)   Wt 193 lb (87.5 kg)   SpO2 97%   BMI 26.18 kg/m    Subjective:   Patient ID: Anthony Kelly, male    DOB: [birth year], 75 y.o.   MRN: [id number]  HPI: Anthony Kelly is a 75 y.o. male presenting on 03/31/2020 for Medical Management of Chronic Issues, Gastroesophageal Reflux, Prediabetes, and axilla pain (Left. Pain present for 62m. Denies nodule)   HPI A. Fib Patient Follows up with cardiology for A. fib.  He will continue to follow-up with them.  He is on Eliquis and metoprolol and flecainide and things are seeming to be under control.  Patient complains of left axillary pain that is been going on for 2 months but it keeps getting better, is not currently lumps or bumps.  It does not hurt with range of motion he has been doing exercises and push-ups and it does not seem to change with that, it is gradually getting better, he denies any neck pain or soreness or upper back pain or shoulder pain on that side.  He says it just hurts in one specific point on his left axilla.  He denies any shortness of breath or pain anywhere else.  Prediabetes Patient comes in today for recheck of his diabetes. Patient has been currently taking no medication has been diet controlled. Patient is not currently on an ACE inhibitor/ARB. Patient has not seen an ophthalmologist this year. Patient denies any issues with their feet. The symptom started onset as an adult hyperlipidemia and A. fib ARE RELATED TO DM   Hyperlipidemia Patient is coming in for recheck of his hyperlipidemia. The patient is currently taking Crestor. They deny any issues with myalgias or history of liver damage from it. They deny any focal numbness or weakness or chest pain.   Relevant past medical, surgical, family and social history reviewed and updated as indicated. Interim medical history since our last visit reviewed. Allergies and medications reviewed and updated.  Review of Systems   Constitutional: Negative for chills and fever.  Respiratory: Negative for shortness of breath and wheezing.   Cardiovascular: Negative for chest pain and leg swelling.  Musculoskeletal: Positive for arthralgias. Negative for back pain, gait problem, joint swelling and myalgias.  Skin: Negative for rash.  Neurological: Negative for dizziness, weakness and light-headedness.  All other systems reviewed and are negative.   Per HPI unless specifically indicated above   Allergies as of 03/31/2020   Not on File     Medication List       Accurate as of March 31, 2020 10:11 AM. If you have any questions, ask your nurse or doctor.        acyclovir 400 MG tablet Commonly known as: ZOVIRAX Take 1 tablet (400 mg total) by mouth 2 (two) times daily.   apixaban 5 MG Tabs tablet Commonly known as: Eliquis Take 1 tablet (5 mg total) by mouth 2 (two) times daily.   brimonidine 0.2 % ophthalmic solution Commonly known as: ALPHAGAN Place 1 drop into both eyes 2 (two) times daily.   cholecalciferol 25 MCG (1000 UNIT) tablet Commonly known as: VITAMIN D3 Take 1,000 Units by mouth daily.   clobetasol 0.05 % topical foam Commonly known as: OLUX Apply 1 application topically 2 (two) times daily.   flecainide 50 MG tablet Commonly known as: TAMBOCOR Take 1 tablet (50 mg total) by mouth 2 (two) times daily.   Lumigan 0.01 %  Soln Generic drug: bimatoprost Place 1 drop into both eyes daily.   meclizine 25 MG tablet Commonly known as: ANTIVERT Take 1 tablet (25 mg total) by mouth 3 (three) times daily as needed for dizziness.   metoprolol succinate 25 MG 24 hr tablet Commonly known as: Toprol XL Take 0.5 tablets (12.5 mg total) by mouth daily.   multivitamin with minerals Tabs tablet Take 1 tablet by mouth daily.   NON FORMULARY Take 4 oz by mouth daily. Beet Root   omeprazole 20 MG capsule Commonly known as: PRILOSEC TAKE 1 CAPSULE DAILY What changed:   how much to  take  how to take this  when to take this  additional instructions   PROBIOTIC DAILY PO Take 1 capsule by mouth daily.   prochlorperazine 10 MG tablet Commonly known as: COMPAZINE Take 1 tablet (10 mg total) by mouth every 6 (six) hours as needed for nausea or vomiting.   rosuvastatin 20 MG tablet Commonly known as: CRESTOR Take 1 tablet (20 mg total) by mouth daily.   terazosin 2 MG capsule Commonly known as: HYTRIN TAKE 1 CAPSULE AT BEDTIME   triamcinolone 0.1 % Commonly known as: KENALOG Apply 1 application topically 2 (two) times daily.   triamcinolone 0.025 % cream Commonly known as: KENALOG Apply 1 application topically 2 (two) times daily.   Zydelig 150 MG tablet Generic drug: idelalisib TAKE 1 TABLET (150 MG TOTAL) BY MOUTH 2 (TWO) TIMES DAILY.        Objective:   BP 111/74   Pulse 63   Ht 6' (1.829 m)   Wt 193 lb (87.5 kg)   SpO2 97%   BMI 26.18 kg/m   Wt Readings from Last 3 Encounters:  03/31/20 193 lb (87.5 kg)  02/18/20 197 lb (89.4 kg)  02/13/20 195 lb 3.2 oz (88.5 kg)    Physical Exam Vitals and nursing note reviewed.  Constitutional:      General: He is not in acute distress.    Appearance: He is well-developed. He is not diaphoretic.  Eyes:     General: No scleral icterus.    Conjunctiva/sclera: Conjunctivae normal.  Neck:     Thyroid: No thyromegaly.  Cardiovascular:     Rate and Rhythm: Normal rate and regular rhythm.     Heart sounds: Normal heart sounds. No murmur heard.   Pulmonary:     Effort: Pulmonary effort is normal. No respiratory distress.     Breath sounds: Normal breath sounds. No wheezing.  Musculoskeletal:        General: Normal range of motion.     Cervical back: Neck supple.  Lymphadenopathy:     Cervical: No cervical adenopathy.  Skin:    General: Skin is warm and dry.     Findings: No rash.  Neurological:     Mental Status: He is alert and oriented to person, place, and time.     Coordination:  Coordination normal.  Psychiatric:        Behavior: Behavior normal.       Assessment & Plan:   Problem List Items Addressed This Visit      Cardiovascular and Mediastinum   PAF (paroxysmal atrial fibrillation) (HCC) (Chronic)     Digestive   GERD (gastroesophageal reflux disease)     Other   Hyperlipidemia   Relevant Orders   Lipid panel   Prediabetes - Primary   Relevant Orders   Bayer DCA Hb A1c Waived    Other Visit Diagnoses  Left axillary pain          No pain in left axilla on range of motion or to palpation, no palpable nodules and has had scans showing no cancer anywhere so I do not know but the left axillary pain except that it is getting better and will monitor and discuss it with his cancer doctor  Continue follow-up with cardiology for A. fib, will check cholesterol panel and A1c today.   Follow up plan: Return in about 3 months (around 07/01/2020), or if symptoms worsen or fail to improve, for Prediabetes and hypertension and A. fib..  Counseling provided for all of the vaccine components No orders of the defined types were placed in this encounter.   Caryl Pina, MD [facility] 03/31/2020, 10:11 AM

## 2020-04-01 LAB — LIPID PANEL
Chol/HDL Ratio: 2.5 ratio (ref 0.0–5.0)
Cholesterol, Total: 114 mg/dL (ref 100–199)
HDL: 46 mg/dL (ref >39)
LDL Chol Calc (NIH): 43 mg/dL (ref 0–99)
Triglycerides: 145 mg/dL (ref 0–149)
VLDL Cholesterol Cal: 25 mg/dL (ref 5–40)

## 2020-04-08 ENCOUNTER — Other Ambulatory Visit (HOSPITAL_COMMUNITY): Payer: Self-pay

## 2020-04-12 ENCOUNTER — Other Ambulatory Visit (HOSPITAL_COMMUNITY): Payer: Self-pay

## 2020-04-12 MED FILL — Idelalisib Tab 150 MG: ORAL | 30 days supply | Qty: 60 | Fill #0 | Status: AC

## 2020-04-13 ENCOUNTER — Other Ambulatory Visit (HOSPITAL_COMMUNITY): Payer: Self-pay

## 2020-04-14 ENCOUNTER — Other Ambulatory Visit (HOSPITAL_COMMUNITY): Payer: Self-pay

## 2020-04-23 DIAGNOSIS — H401131 Primary open-angle glaucoma, bilateral, mild stage: Secondary | ICD-10-CM | POA: Diagnosis not present

## 2020-04-23 DIAGNOSIS — H04123 Dry eye syndrome of bilateral lacrimal glands: Secondary | ICD-10-CM | POA: Diagnosis not present

## 2020-04-23 DIAGNOSIS — Z961 Presence of intraocular lens: Secondary | ICD-10-CM | POA: Diagnosis not present

## 2020-04-23 DIAGNOSIS — H2512 Age-related nuclear cataract, left eye: Secondary | ICD-10-CM | POA: Diagnosis not present

## 2020-05-08 ENCOUNTER — Other Ambulatory Visit (HOSPITAL_COMMUNITY): Payer: Self-pay

## 2020-05-08 MED FILL — Idelalisib Tab 150 MG: ORAL | 30 days supply | Qty: 60 | Fill #1 | Status: AC

## 2020-05-12 ENCOUNTER — Other Ambulatory Visit: Payer: Self-pay | Admitting: Hematology and Oncology

## 2020-05-12 ENCOUNTER — Other Ambulatory Visit (HOSPITAL_COMMUNITY): Payer: Self-pay

## 2020-05-12 DIAGNOSIS — C8280 Other types of follicular lymphoma, unspecified site: Secondary | ICD-10-CM

## 2020-05-19 ENCOUNTER — Encounter: Payer: Self-pay | Admitting: Hematology and Oncology

## 2020-05-19 ENCOUNTER — Inpatient Hospital Stay: Payer: Medicare Other

## 2020-05-19 ENCOUNTER — Other Ambulatory Visit: Payer: Self-pay

## 2020-05-19 ENCOUNTER — Inpatient Hospital Stay: Payer: Medicare Other | Attending: Hematology and Oncology | Admitting: Hematology and Oncology

## 2020-05-19 DIAGNOSIS — Z79899 Other long term (current) drug therapy: Secondary | ICD-10-CM | POA: Diagnosis not present

## 2020-05-19 DIAGNOSIS — C44319 Basal cell carcinoma of skin of other parts of face: Secondary | ICD-10-CM | POA: Diagnosis not present

## 2020-05-19 DIAGNOSIS — Z9221 Personal history of antineoplastic chemotherapy: Secondary | ICD-10-CM | POA: Diagnosis not present

## 2020-05-19 DIAGNOSIS — D696 Thrombocytopenia, unspecified: Secondary | ICD-10-CM | POA: Diagnosis not present

## 2020-05-19 DIAGNOSIS — Z7901 Long term (current) use of anticoagulants: Secondary | ICD-10-CM | POA: Insufficient documentation

## 2020-05-19 DIAGNOSIS — C8280 Other types of follicular lymphoma, unspecified site: Secondary | ICD-10-CM

## 2020-05-19 DIAGNOSIS — J432 Centrilobular emphysema: Secondary | ICD-10-CM | POA: Diagnosis not present

## 2020-05-19 DIAGNOSIS — C8298 Follicular lymphoma, unspecified, lymph nodes of multiple sites: Secondary | ICD-10-CM | POA: Diagnosis not present

## 2020-05-19 DIAGNOSIS — I2510 Atherosclerotic heart disease of native coronary artery without angina pectoris: Secondary | ICD-10-CM

## 2020-05-19 LAB — CBC WITH DIFFERENTIAL/PLATELET
Abs Immature Granulocytes: 0.02 10*3/uL (ref 0.00–0.07)
Basophils Absolute: 0.1 10*3/uL (ref 0.0–0.1)
Basophils Relative: 1 %
Eosinophils Absolute: 0.8 10*3/uL — ABNORMAL HIGH (ref 0.0–0.5)
Eosinophils Relative: 11 %
HCT: 46.6 % (ref 39.0–52.0)
Hemoglobin: 15.8 g/dL (ref 13.0–17.0)
Immature Granulocytes: 0 %
Lymphocytes Relative: 27 %
Lymphs Abs: 2 10*3/uL (ref 0.7–4.0)
MCH: 30.9 pg (ref 26.0–34.0)
MCHC: 33.9 g/dL (ref 30.0–36.0)
MCV: 91 fL (ref 80.0–100.0)
Monocytes Absolute: 0.7 10*3/uL (ref 0.1–1.0)
Monocytes Relative: 9 %
Neutro Abs: 3.7 10*3/uL (ref 1.7–7.7)
Neutrophils Relative %: 52 %
Platelets: 144 10*3/uL — ABNORMAL LOW (ref 150–400)
RBC: 5.12 MIL/uL (ref 4.22–5.81)
RDW: 13.2 % (ref 11.5–15.5)
WBC: 7.2 10*3/uL (ref 4.0–10.5)
nRBC: 0 % (ref 0.0–0.2)

## 2020-05-19 LAB — COMPREHENSIVE METABOLIC PANEL
ALT: 18 U/L (ref 0–44)
AST: 20 U/L (ref 15–41)
Albumin: 4.1 g/dL (ref 3.5–5.0)
Alkaline Phosphatase: 51 U/L (ref 38–126)
Anion gap: 10 (ref 5–15)
BUN: 14 mg/dL (ref 8–23)
CO2: 24 mmol/L (ref 22–32)
Calcium: 9 mg/dL (ref 8.9–10.3)
Chloride: 107 mmol/L (ref 98–111)
Creatinine, Ser: 1.22 mg/dL (ref 0.61–1.24)
GFR, Estimated: >60 mL/min (ref >60)
Glucose, Bld: 89 mg/dL (ref 70–99)
Potassium: 4.3 mmol/L (ref 3.5–5.1)
Sodium: 141 mmol/L (ref 135–145)
Total Bilirubin: 0.7 mg/dL (ref 0.3–1.2)
Total Protein: 6.5 g/dL (ref 6.5–8.1)

## 2020-05-19 NOTE — Assessment & Plan Note (Signed)
His last CT in October 2021 showed excellent response to therapy Overall, he tolerated treatment fairly well The plan would be to continue treatment indefinitely I plan to space out the interval between his imaging studies With lack of symptoms and completely normal blood count, I recommend only an annual imaging study unless he has signs and symptoms to suggest disease progression or annually around October 2022 I will see him every 3 months

## 2020-05-19 NOTE — Assessment & Plan Note (Signed)
He has new skin lesion on the left cheek/temple area, resemble basal cell carcinoma He will be seeing a surgeon for management soon We discussed the importance of aggressive skin protection and avoidance of excessive sun exposure

## 2020-05-19 NOTE — Assessment & Plan Note (Signed)
This is likely due to recent treatment. The patient denies recent history of bleeding such as epistaxis, hematuria or hematochezia. He is asymptomatic from the low platelet count. I will observe for now.  he does not require transfusion now. I will continue the chemotherapy at current dose without dosage adjustment.  If the thrombocytopenia gets progressive worse in the future, I might have to delay his treatment or adjust the chemotherapy dose.

## 2020-05-19 NOTE — Progress Notes (Signed)
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 The patient denies recent history of bleeding such as epistaxis, hematuria or hematochezia. He is asymptomatic from the low platelet count. I will observe for now.  he does not require transfusion now. I will continue the chemotherapy at current dose without dosage adjustment.  If the thrombocytopenia gets progressive worse in the future, I might have to delay his treatment or adjust the chemotherapy dose.    Basal cell carcinoma (BCC) of cheek He has new skin lesion on the left cheek/temple area, resemble basal cell carcinoma He will be seeing a surgeon for management soon We discussed the importance of aggressive skin protection and avoidance of excessive sun exposure   No orders of the defined types were placed in this encounter.   All questions were answered. The patient knows to call the clinic with any problems,  [name], discussions about plan of care and coordination of care plan   Anthony Lark, MD 05/19/2020 10:32 AM  INTERVAL HISTORY: Please see below for problem oriented charting. He returns with his wife for further follow-up He is compliant taking his chemotherapy as directed No side effects recently No new infection, fever or chills No new lymphadenopathy He is being referred by his dermatologist to see a surgeon due to recurrent skin cancer on his left cheek area  SUMMARY OF ONCOLOGIC HISTORY: Oncology History Overview Note  Low grade B-cell lymphoma   Primary site: Lymphoid Neoplasms   Staging method: AJCC 6th Edition   Clinical: Stage IV signed by Anthony Lark, MD on 09/26/2013  9:15 AM   Summary: Stage IV      Follicular low grade B-cell lymphoma ([place])  12/10/2003 Surgery   Inguinal lymph node biopsy showed follicular lymphoma.   12/12/2003 - 06/01/2004 Chemotherapy   He was treated with R. CHOP chemotherapy which show complete remission. The number of cycles of R. CHOP chemotherapy was unknown.   12/19/2006 Surgery   Lung resection show follicular lymphoma.   01/02/2007 - 09/01/2008 Chemotherapy   The patient was treated with single agent rituximab alone.   01/19/2007 Bone Marrow Biopsy   Bone marrow biopsy was negative.   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   05/08/2013 - 05/11/2013 Hospital Admission   The patient was admitted to the hospital for management of pericarditis. CT scan showed extensive lymphadenopathy.   06/07/2013 Imaging   PET/CT scan showed extensive lymphadenopathy  06/25/2013 Procedure   He has placement of Infuse-a-Port.   06/28/2013 Bone Marrow Biopsy   Bone marrow biopsy is positive for lymphoma involvement.   07/04/2013 - 11/22/2013 Chemotherapy   He is treated  with 6 cycles of bendamustine with rituximab.   09/24/2013 Imaging   Repeat PET scan show complete remission.   12/26/2013 Imaging   PEt scan showed complete remission   09/26/2014 Imaging   CT scan of the chest abdomen and pelvis show no evidence of disease   03/26/2015 Imaging   CT scan showed no evidence of lymphoma   04/14/2016 Imaging   CT: Borderline prominent right hilar and subcarinal lymph nodes, but not appreciably changed. 2. Low-grade but increased central mesenteric stranding with some small mesenteric lymph nodes. This could certainly be inflammatory, 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, [name], volume estimated at 75 cubic cm.   02/15/2017 Imaging   No evidence of recurrent lymphoma or other acute findings.  Stable moderate hiatal hernia.  Colonic diverticulosis, without radiographic evidence of diverticulitis.  Stable mildly enlarged prostate.  Mild emphysema.  Aortic and coronary artery atherosclerosis.   01/31/2019 Imaging   1. Interval development of abdominal and pelvic adenopathy compatible with recurrent lymphoma. 2. Emphysema and aortic atherosclerosis. 3. Multi vessel coronary artery calcifications. 4. Moderate to large hiatal hernia   02/09/2019 Procedure   Successful CT-guided core biopsy of the left retroperitoneal periaortic adenopathy   02/09/2019 Pathology Results   SURGICAL PATHOLOGY  CASE: [id number]  PATIENT: Anthony Kelly  Surgical Pathology Report    Clinical History: Lymphoma; [name], LEFT PARA AORTIC, [name], cleaved nuclei and scant cytoplasm.  By immunohistochemistry, [name], [name], [name], [name] is low (less than 10%).  Flow cytometry was attempted; however, there was insufficient material for analysis (see [id number]).  Overall, the features are consistent with relapse of the patient's previously diagnosed follicular lymphoma.  Based on the biopsy, this is favored to be a low-grade follicular lymphoma   03/20/7671 -  Chemotherapy   The patient had idelisib for chemotherapy treatment.     05/17/2019 Imaging   1. Interval generalized decrease in abdominopelvic lymphadenopathy identified on the previous study. No new or progressive lymphadenopathy on today's study. 2. Moderate hiatal hernia. 3.  Emphysema (ICD10-J43.9) and Aortic Atherosclerosis (ICD10-170.0)   11/16/2019 Imaging   IMPRESSION: Chest Impression:   1. No mediastinal lymphadenopathy. 2. Post RIGHT lung wedge resection without evidence local recurrence.   Abdomen / Pelvis Impression:   1. Stable numerous small periaortic retroperitoneal nodes and common iliac nodes. No progression of adenopathy. 2.  No splenomegaly.  No skeletal metastasis.       REVIEW OF SYSTEMS:   Constitutional: Denies fevers, chills or abnormal weight loss Eyes: Denies blurriness of vision Ears, nose, mouth, throat, and face: Denies mucositis or sore  throat Respiratory: Denies cough, dyspnea or wheezes Cardiovascular: Denies palpitation, chest discomfort or lower extremity swelling Gastrointestinal:  Denies nausea, heartburn or change in bowel habits Skin: Denies abnormal skin rashes Lymphatics: Denies new lymphadenopathy or easy bruising Neurological:Denies numbness, tingling or new weaknesses Behavioral/Psych: Mood is stable, no new changes  All other systems were reviewed with the patient and are negative.  I have reviewed the past medical history, past surgical history, social history and family history with the patient and they are unchanged from previous note.  ALLERGIES:  has no allergies on file.  MEDICATIONS:  Current Outpatient Medications  Medication Sig Dispense Refill  . acyclovir (ZOVIRAX) 400 MG tablet TAKE 1 TABLET TWICE A DAY 180 tablet 3  . apixaban (ELIQUIS) 5 MG TABS tablet Take 1 tablet (5 mg total) by mouth 2 (two) times daily. 180 tablet 2  . brimonidine (ALPHAGAN) 0.2 % ophthalmic solution Place 1 drop into both eyes 2 (two) times daily.     . cholecalciferol (VITAMIN D3) 25 MCG (1000 UNIT) tablet Take 1,000 Units by mouth daily.    . clobetasol (OLUX) 0.05 % topical foam Apply 1 application topically 2 (two) times daily.    . flecainide (TAMBOCOR) 50 MG tablet Take 1 tablet (50 mg total) by mouth 2 (two) times daily. 180 tablet 3  . idelalisib (ZYDELIG) 150 MG tablet TAKE 1 TABLET (150 MG TOTAL) BY MOUTH 2 (TWO) TIMES DAILY. 60 tablet 11  . LUMIGAN 0.01 % SOLN Place 1 drop into both eyes daily.    . meclizine (ANTIVERT) 25 MG tablet Take 1 tablet (25 mg total) by mouth 3 (three) times daily as needed for dizziness. 30 tablet 1  . metoprolol succinate (TOPROL XL) 25 MG 24 hr tablet Take 0.5 tablets (12.5 mg total) by mouth daily. 45 tablet 1  . Multiple Vitamin (MULTIVITAMIN WITH MINERALS) TABS tablet Take 1 tablet by mouth daily.     . NON FORMULARY Take 4 oz by mouth daily. Beet Root    . omeprazole  (PRILOSEC) 20 MG capsule TAKE 1 CAPSULE DAILY (Patient taking differently: Every other day) 90 capsule 1  . Probiotic Product (PROBIOTIC DAILY PO) Take 1 capsule by mouth daily.     . prochlorperazine (COMPAZINE) 10 MG tablet Take 1 tablet (10 mg total) by mouth every 6 (six) hours as needed for nausea or vomiting. 30 tablet 2  . rosuvastatin (CRESTOR) 20 MG tablet Take 1 tablet (20 mg total) by mouth daily. 90 tablet 3  . terazosin (HYTRIN) 2 MG capsule TAKE 1 CAPSULE AT BEDTIME 90 capsule 3  . triamcinolone (KENALOG) 0.025 % cream Apply 1 application topically 2 (two) times daily.    Marland Kitchen triamcinolone (KENALOG) 0.1 % Apply 1 application topically 2 (two) times daily.     No current facility-administered medications for this visit.    PHYSICAL EXAMINATION: ECOG PERFORMANCE STATUS: 1 - Symptomatic but completely ambulatory  Vitals:   05/19/20 0959  BP: 115/72  Pulse: 63  Resp: 18  Temp: (!) 97.4 F (36.3 C)  SpO2: 99%   Filed Weights   05/19/20 0959  Weight: 196 lb 6.4 oz (89.1 kg)    GENERAL:alert, no distress and comfortable SKIN: Noted skin lesion on the left side of his face  EYES: normal, Conjunctiva are pink and non-injected, sclera clear OROPHARYNX:no exudate, no erythema and lips, buccal mucosa, and tongue normal  NECK: supple, thyroid normal size, non-tender, without nodularity LYMPH:  no palpable lymphadenopathy in the cervical, axillary or inguinal LUNGS: clear to auscultation and percussion with normal breathing effort HEART: regular rate & rhythm and no murmurs and no lower extremity edema ABDOMEN:abdomen soft, non-tender and normal bowel sounds Musculoskeletal:no cyanosis of digits and no clubbing  NEURO: alert & oriented x 3 with fluent speech, no focal motor/sensory deficits  LABORATORY DATA:  I have reviewed the data as listed    Component Value Date/Time   NA 141 05/19/2020 0942   NA 140 09/27/2019 0801   NA 139 04/14/2016 0943   K 4.3 05/19/2020 0942    K 4.6 04/14/2016 0943   CL 107 05/19/2020 0942   CL 104 03/24/2012 1024   CO2 24 05/19/2020 0942   CO2 25 04/14/2016 0943   GLUCOSE 89 05/19/2020 0942   GLUCOSE 111 04/14/2016 0943   GLUCOSE 80 03/24/2012 1024   BUN 14 05/19/2020 0942   BUN 15 09/27/2019 0801   BUN 8.6 04/14/2016 0943   CREATININE 1.22 05/19/2020 0942   CREATININE 1.1 04/14/2016 0943   CALCIUM 9.0 05/19/2020 0942   CALCIUM 9.4 04/14/2016 0943   PROT 6.5 05/19/2020 0942   PROT 6.4 12/27/2019 1143   PROT 6.5 04/14/2016 0943   ALBUMIN 4.1 05/19/2020 0942   ALBUMIN 4.4 09/27/2019 0801   ALBUMIN 3.8 04/14/2016 0943   AST 20 05/19/2020 0942   AST 28 04/14/2016 0943   ALT 18 05/19/2020 0942   ALT 41 04/14/2016 0943   ALKPHOS 51 05/19/2020 0942   ALKPHOS 71 04/14/2016 0943   BILITOT 0.7 05/19/2020 0942   BILITOT 0.5 09/27/2019 0801   BILITOT 0.61 04/14/2016 0943   GFRNONAA >60 05/19/2020 0942   GFRAA 69 09/27/2019 0801    No results found for: SPEP, UPEP  Lab Results  Component Value Date   WBC 7.2 05/19/2020   NEUTROABS 3.7 05/19/2020   HGB 15.8 05/19/2020   HCT 46.6 05/19/2020   MCV 91.0 05/19/2020   PLT 144 (L) 05/19/2020      Chemistry      Component Value Date/Time   NA 141 05/19/2020 0942   NA 140 09/27/2019 0801   NA 139 04/14/2016 0943   K 4.3 05/19/2020 0942   K 4.6 04/14/2016 0943   CL 107 05/19/2020 0942   CL 104 03/24/2012 1024   CO2 24 05/19/2020 0942   CO2 25 04/14/2016 0943   BUN 14 05/19/2020 0942   BUN 15 09/27/2019 0801   BUN 8.6 04/14/2016 0943   CREATININE 1.22 05/19/2020 0942   CREATININE 1.1 04/14/2016 0943      Component Value Date/Time   CALCIUM 9.0 05/19/2020 0942   CALCIUM 9.4 04/14/2016 0943   ALKPHOS 51 05/19/2020 0942   ALKPHOS 71 04/14/2016 0943   AST 20 05/19/2020 0942   AST 28 04/14/2016 0943   ALT 18 05/19/2020 0942   ALT 41 04/14/2016 0943   BILITOT 0.7 05/19/2020 0942   BILITOT 0.5 09/27/2019 0801   BILITOT 0.61 04/14/2016 1610

## 2020-05-21 ENCOUNTER — Encounter: Payer: Self-pay | Admitting: Hematology and Oncology

## 2020-05-27 ENCOUNTER — Other Ambulatory Visit: Payer: Self-pay | Admitting: *Deleted

## 2020-05-27 MED ORDER — FLECAINIDE ACETATE 50 MG PO TABS
50.0000 mg | ORAL_TABLET | Freq: Two times a day (BID) | ORAL | 3 refills | Status: DC
Start: 2020-05-27 — End: 2020-06-06

## 2020-05-29 ENCOUNTER — Encounter: Payer: Self-pay | Admitting: Hematology and Oncology

## 2020-06-06 ENCOUNTER — Other Ambulatory Visit: Payer: Self-pay

## 2020-06-06 ENCOUNTER — Other Ambulatory Visit (HOSPITAL_COMMUNITY): Payer: Self-pay

## 2020-06-06 MED ORDER — FLECAINIDE ACETATE 50 MG PO TABS
50.0000 mg | ORAL_TABLET | Freq: Two times a day (BID) | ORAL | 3 refills | Status: DC
Start: 2020-06-06 — End: 2021-07-06

## 2020-06-06 MED FILL — Idelalisib Tab 150 MG: ORAL | 30 days supply | Qty: 60 | Fill #2 | Status: AC

## 2020-06-06 NOTE — Telephone Encounter (Signed)
[facility] for flecainide 50 mg bid, #180 RF:3      e-scribed

## 2020-06-10 ENCOUNTER — Other Ambulatory Visit (HOSPITAL_COMMUNITY): Payer: Self-pay

## 2020-06-11 ENCOUNTER — Other Ambulatory Visit (HOSPITAL_COMMUNITY): Payer: Self-pay

## 2020-06-17 DIAGNOSIS — D485 Neoplasm of uncertain behavior of skin: Secondary | ICD-10-CM | POA: Diagnosis not present

## 2020-06-17 DIAGNOSIS — L570 Actinic keratosis: Secondary | ICD-10-CM | POA: Diagnosis not present

## 2020-06-17 DIAGNOSIS — D044 Carcinoma in situ of skin of scalp and neck: Secondary | ICD-10-CM | POA: Diagnosis not present

## 2020-06-17 DIAGNOSIS — C44329 Squamous cell carcinoma of skin of other parts of face: Secondary | ICD-10-CM | POA: Diagnosis not present

## 2020-06-17 DIAGNOSIS — L820 Inflamed seborrheic keratosis: Secondary | ICD-10-CM | POA: Diagnosis not present

## 2020-06-17 DIAGNOSIS — D0462 Carcinoma in situ of skin of left upper limb, including shoulder: Secondary | ICD-10-CM | POA: Diagnosis not present

## 2020-06-17 DIAGNOSIS — D0439 Carcinoma in situ of skin of other parts of face: Secondary | ICD-10-CM | POA: Diagnosis not present

## 2020-06-17 DIAGNOSIS — L821 Other seborrheic keratosis: Secondary | ICD-10-CM | POA: Diagnosis not present

## 2020-06-19 ENCOUNTER — Telehealth: Payer: Self-pay

## 2020-06-19 NOTE — Telephone Encounter (Signed)
Called and left a message at Dr. Darliss Ridgel office requesting office notes faxed to out office. Left fax #.

## 2020-06-19 NOTE — Telephone Encounter (Signed)
PLs get the notes

## 2020-06-19 NOTE — Telephone Encounter (Signed)
Wife called and left a message to call.  Called back and spoke with [name]. He went to Dr. Tamala Julian, dermatologist and had a biopsy. They are scheduling surgery for invasive squamous cell CA.  I can call the dermatologist office if you need office notes.

## 2020-06-20 ENCOUNTER — Telehealth: Payer: Self-pay | Admitting: Hematology and Oncology

## 2020-06-20 NOTE — Telephone Encounter (Signed)
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

## 2020-07-01 ENCOUNTER — Ambulatory Visit: Payer: Medicare Other | Admitting: Family Medicine

## 2020-07-03 ENCOUNTER — Encounter: Payer: Self-pay | Admitting: Nurse Practitioner

## 2020-07-03 ENCOUNTER — Telehealth: Payer: Self-pay

## 2020-07-03 ENCOUNTER — Ambulatory Visit (INDEPENDENT_AMBULATORY_CARE_PROVIDER_SITE_OTHER): Payer: Medicare Other | Admitting: Nurse Practitioner

## 2020-07-03 ENCOUNTER — Telehealth: Payer: Self-pay | Admitting: Family Medicine

## 2020-07-03 ENCOUNTER — Other Ambulatory Visit (HOSPITAL_COMMUNITY): Payer: Self-pay

## 2020-07-03 VITALS — Temp 100.9°F

## 2020-07-03 DIAGNOSIS — U071 COVID-19: Secondary | ICD-10-CM | POA: Diagnosis not present

## 2020-07-03 MED ORDER — NIRMATRELVIR/RITONAVIR (PAXLOVID) TABLET (RENAL DOSING): 2.0000 | ORAL_TABLET | Freq: Two times a day (BID) | ORAL | 0 refills | Status: AC

## 2020-07-03 MED FILL — Idelalisib Tab 150 MG: ORAL | 30 days supply | Qty: 60 | Fill #3 | Status: AC

## 2020-07-03 NOTE — Progress Notes (Signed)
   Virtual Visit  Note Due to COVID-19 pandemic this visit was conducted virtually. This visit type was conducted due to national recommendations for restrictions regarding the COVID-19 Pandemic (e.g. social distancing, sheltering in place) in an effort to limit this patient's exposure and mitigate transmission in our community. All issues noted in this document were discussed and addressed.  A physical exam was not performed with this format.  I connected with Anthony Kelly on 07/03/20 at 12:10 PM by telephone and verified that I am speaking with the correct person using two identifiers. Anthony Kelly is currently located at home and spouse is with patient during visit. The provider, Ivy Lynn, NP is located in their office at time of visit.  I discussed the limitations, risks, security and privacy concerns of performing an evaluation and management service by telephone and the availability of in person appointments. I also discussed with the patient that there may be a patient responsible charge related to this service. The patient expressed understanding and agreed to proceed.   History and Present Illness:  Sore Throat  This is a new problem. The current episode started yesterday. The problem has been gradually worsening. Associated symptoms include congestion, coughing, headaches and a hoarse voice. He has had no exposure to strep. He has tried nothing for the symptoms. The treatment provided no relief.     Review of Systems  Constitutional:  Positive for chills, fever and malaise/fatigue.  HENT:  Positive for congestion, hoarse voice and sore throat.   Respiratory:  Positive for cough.   Cardiovascular:  Positive for chest pain.  Skin:  Negative for rash.  Neurological:  Positive for headaches.    Observations/Objective: Televisit patient not in distress  Assessment and Plan:  Patient positive for COVID-19, with Hx of cancer. Patient will hold cancer treatment and complete  Paxlovid for 5 days. I educated patient on the risk of paxlovid in combination with Eliquis patients spouse is aware and wants  medication sent to [facility] drug and will find out from oncologist if it is safe to take both medications together before picking it up from pharmacy.  Follow Up Instructions:  Follow-up with worsening unresolved symptoms    I discussed the assessment and treatment plan with the patient. The patient was provided an opportunity to ask questions and all were answered. The patient agreed with the plan and demonstrated an understanding of the instructions.   The patient was advised to call back or seek an in-person evaluation if the symptoms worsen or if the condition fails to improve as anticipated.  The above assessment and management plan was discussed with the patient. The patient verbalized understanding of and has agreed to the management plan. Patient is aware to call the clinic if symptoms persist or worsen. Patient is aware when to return to the clinic for a follow-up visit. Patient educated on when it is appropriate to go to the emergency department.   Time call ended: 12:20 PM  I provided 10 minutes of  non face-to-face time during this encounter.    Ivy Lynn, NP

## 2020-07-03 NOTE — Telephone Encounter (Signed)
Wife left a message regarding Paxlovid and the interaction with Eliquis.  Called back and spoke with wife. Per Dr. Alvy Bimler, if he was to take the Paxlovid, hold off on taking the Eliquis for the 5 days of Paxlovid. Or just continue supportive care and not take the Paxlovid. Wife verbalized understanding.  Anthony Kelly and Anthony Kelly have decided to not take the Paxlovid.

## 2020-07-03 NOTE — Assessment & Plan Note (Signed)
Patient positive for COVID-19, with Hx of cancer. Patient will hold cancer treatment and complete Paxlovid for 5 days. I educated patient on the risk of paxlovid in combination with Eliquis patients spouse is aware and wants  medication sent to [facility] drug and will find out from oncologist if it is safe to take both medications together before picking it up from pharmacy.   Rx sent to pharmacy

## 2020-07-03 NOTE — Telephone Encounter (Signed)
Called and given below message to wife. She verbalized understanding and will call PCP.

## 2020-07-03 NOTE — Telephone Encounter (Signed)
Returned call to wife. She said she called yesterday and needs a call back.  Called [facility]. Anthony Kelly tested positive for covid yesterday. Symptoms started yesterday. He is vaccinated. Symptoms are sore throat, body aches, headache, cough and low grade fever.  Wife is asking if he should take the antiviral for covid? She is asking if Dr. Alvy Bimler is okay with him taking and she can call Anthony Kelly"s PCP if needed.

## 2020-07-03 NOTE — Patient Instructions (Signed)
10 Things You Can Do to Manage Your COVID-19 Symptoms at Home If you have possible or confirmed COVID-19 Stay home except to get medical care. Monitor your symptoms carefully. If your symptoms get worse, call your healthcare provider immediately. Get rest and stay hydrated. If you have a medical appointment, call the healthcare provider ahead of time and tell them that you have or may have COVID-19. For medical emergencies, call 911 and notify the dispatch personnel that you have or may have COVID-19. Cover your cough and sneezes with a tissue or use the inside of your elbow. Wash your hands often with soap and water for at least 20 seconds or clean your hands with an alcohol-based hand sanitizer that contains at least 60% alcohol. As much as possible, stay in a specific room and away from other people in your home. Also, you should use a separate bathroom, if available. If you need to be around other people in or outside of the home, wear a mask. Avoid sharing personal items with other people in your household, like dishes, towels, and bedding. Clean all surfaces that are touched often, like counters, tabletops, and doorknobs. Use household cleaning sprays or wipes according to the label instructions. cdc.gov/coronavirus 07/27/2019 This information is not intended to replace advice given to you by your health care provider. Make sure you discuss any questions you have with your healthcare provider. Document Revised: 02/15/2020 Document Reviewed: 02/15/2020 Elsevier Patient Education  2022 [employer] Inc.  

## 2020-07-03 NOTE — Telephone Encounter (Signed)
[facility] Pharmacist wants to double check that the dosage is correct for Paxlovid. Pt has a normal GFR so not sure why the prescription is written for renal problem.  Paxlovid sent today by [facility]. Please see med list for dosing.  Please call [facility] with correction if needed.

## 2020-07-03 NOTE — Telephone Encounter (Signed)
I suggest stop chemo pill for 7 days Ask if his PCP will prescribe Paxlovid

## 2020-07-08 ENCOUNTER — Other Ambulatory Visit: Payer: Self-pay | Admitting: *Deleted

## 2020-07-08 MED ORDER — METOPROLOL SUCCINATE ER 25 MG PO TB24
12.5000 mg | ORAL_TABLET | Freq: Every day | ORAL | 2 refills | Status: DC
Start: 2020-07-08 — End: 2021-01-19

## 2020-07-09 ENCOUNTER — Other Ambulatory Visit (HOSPITAL_COMMUNITY): Payer: Self-pay

## 2020-07-10 ENCOUNTER — Other Ambulatory Visit (HOSPITAL_COMMUNITY): Payer: Self-pay

## 2020-07-21 ENCOUNTER — Encounter: Payer: Self-pay | Admitting: Hematology and Oncology

## 2020-07-22 ENCOUNTER — Other Ambulatory Visit: Payer: Self-pay | Admitting: Hematology and Oncology

## 2020-07-22 ENCOUNTER — Telehealth: Payer: Self-pay

## 2020-07-22 DIAGNOSIS — C44319 Basal cell carcinoma of skin of other parts of face: Secondary | ICD-10-CM

## 2020-07-22 DIAGNOSIS — C8280 Other types of follicular lymphoma, unspecified site: Secondary | ICD-10-CM

## 2020-07-22 NOTE — Telephone Encounter (Signed)
Spoke with patient's wife regarding scheduling CT scan and labs. Patient was able to be scheduled for CT scan on July 20th at 1:30 pm. Patient and wife aware that labs would be required prior to the scan. Lab appointment made for [facility] at 12:15 pm on July 20th.  Informed patient's wife that Dr. Alvy Bimler would like to see them again on July 22nd at 12:00 for follow-up and to discuss the results of the upcoming scan. [name] confirmed dates and times.  No additional questions or concerns at this time.

## 2020-07-24 ENCOUNTER — Emergency Department (HOSPITAL_COMMUNITY): Payer: Medicare Other

## 2020-07-24 ENCOUNTER — Emergency Department (HOSPITAL_COMMUNITY)
Admission: EM | Admit: 2020-07-24 | Discharge: 2020-07-24 | Disposition: A | Discharge status: Home / Self Care | Payer: Medicare Other | Attending: Emergency Medicine | Admitting: Emergency Medicine

## 2020-07-24 ENCOUNTER — Other Ambulatory Visit: Payer: Self-pay

## 2020-07-24 ENCOUNTER — Encounter (HOSPITAL_COMMUNITY): Payer: Self-pay | Admitting: *Deleted

## 2020-07-24 ENCOUNTER — Telehealth: Payer: Self-pay

## 2020-07-24 DIAGNOSIS — Z8572 Personal history of non-Hodgkin lymphomas: Secondary | ICD-10-CM | POA: Insufficient documentation

## 2020-07-24 DIAGNOSIS — I639 Cerebral infarction, unspecified: Secondary | ICD-10-CM | POA: Diagnosis not present

## 2020-07-24 DIAGNOSIS — Z7901 Long term (current) use of anticoagulants: Secondary | ICD-10-CM | POA: Insufficient documentation

## 2020-07-24 DIAGNOSIS — Z85828 Personal history of other malignant neoplasm of skin: Secondary | ICD-10-CM | POA: Diagnosis not present

## 2020-07-24 DIAGNOSIS — Z87891 Personal history of nicotine dependence: Secondary | ICD-10-CM | POA: Diagnosis not present

## 2020-07-24 DIAGNOSIS — Z8669 Personal history of other diseases of the nervous system and sense organs: Secondary | ICD-10-CM | POA: Diagnosis not present

## 2020-07-24 DIAGNOSIS — H532 Diplopia: Secondary | ICD-10-CM | POA: Diagnosis not present

## 2020-07-24 DIAGNOSIS — R29818 Other symptoms and signs involving the nervous system: Secondary | ICD-10-CM | POA: Diagnosis not present

## 2020-07-24 IMAGING — MR MR HEAD W/O CM
9 of 10 series · 39 of 48 positions shown · non-contrast
Comparison: Head CT [DATE].

CLINICAL DATA: Neuro deficit, acute, stroke suspected. Additional
provided: Visual disturbances while driving.

EXAM:
MRI HEAD WITHOUT CONTRAST
MRA HEAD WITHOUT CONTRAST
TECHNIQUE: Multiplanar, multi-echo pulse sequences of the brain and surrounding
structures were acquired without intravenous contrast. Angiographic
images of the Circle of Willis were acquired using MRA technique
without intravenous contrast.

[Series 5: DWI · axial · 4.0mm · 0.88mm/px · z∈[-84,+54]mm · 5 of 36 slices shown (1 of 4)]
[im 1/36]
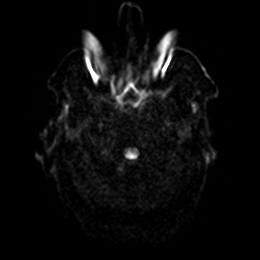
[im 9/36]
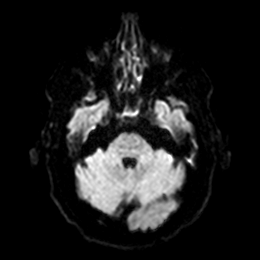
[im 18/36]
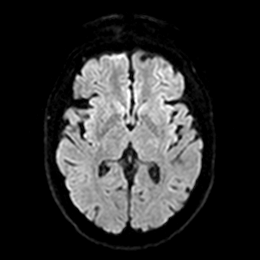
[im 27/36]
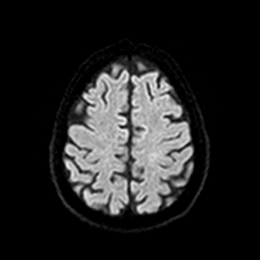
[im 36/36]
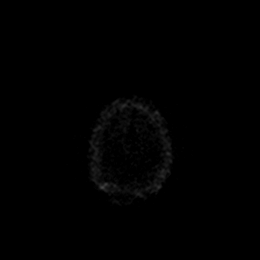

[Series 6: DWI · axial · 4.0mm · 0.88mm/px · z∈[-84,+54]mm · 5 of 36 slices shown (2 of 4)]
[im 1/36]
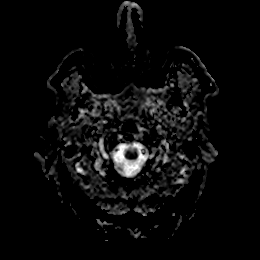
[im 9/36]
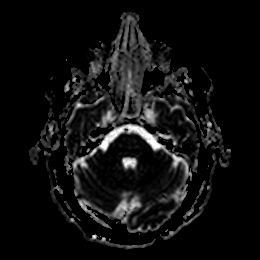
[im 18/36]
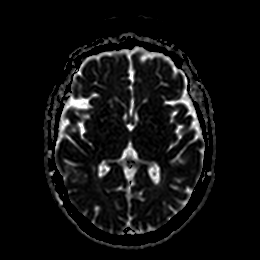
[im 27/36]
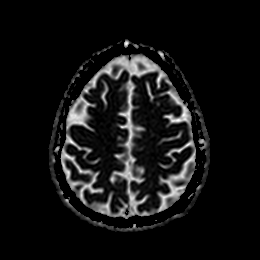
[im 36/36]
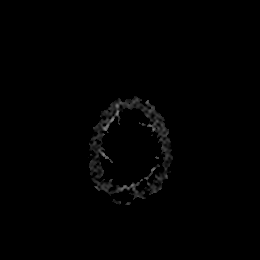

[Series 7: DWI · coronal · 4.0mm · 0.88mm/px · 5 of 32 slices shown (3 of 4)]
[im 1/32]
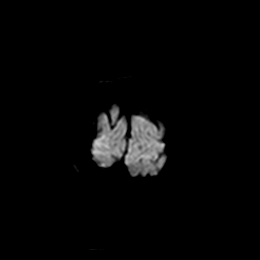
[im 8/32]
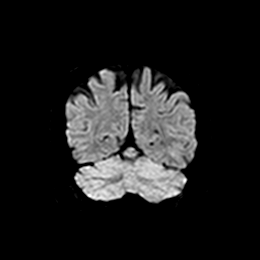
[im 16/32]
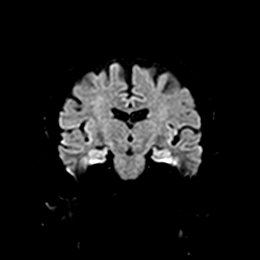
[im 24/32]
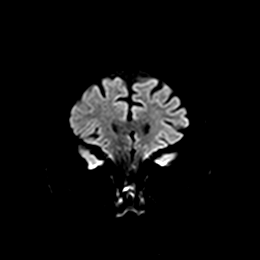
[im 32/32]
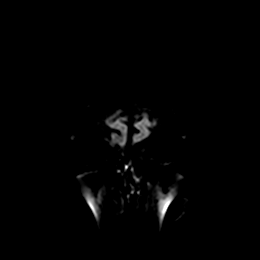

[Series 8: DWI · coronal · 4.0mm · 0.88mm/px · 5 of 32 slices shown (4 of 4)]
[im 1/32]
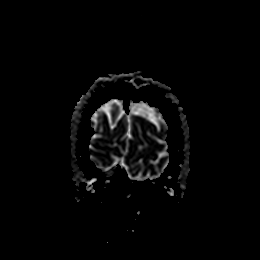
[im 8/32]
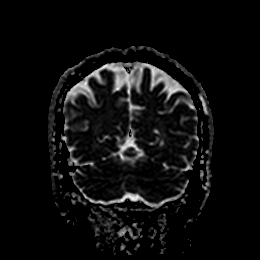
[im 16/32]
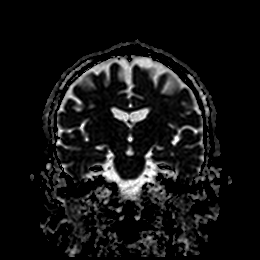
[im 24/32]
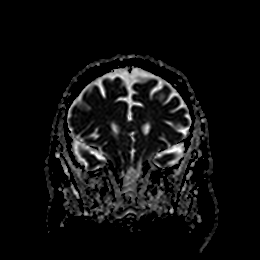
[im 32/32]
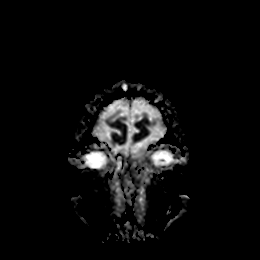

[Series 9: T1 · sagittal · 5.0mm · 0.80mm/px · 3 of 23 slices shown]
[im 1/23]
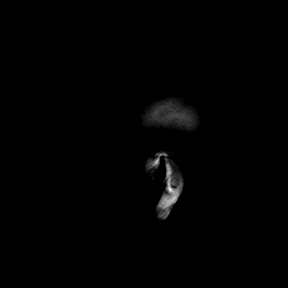
[im 12/23]
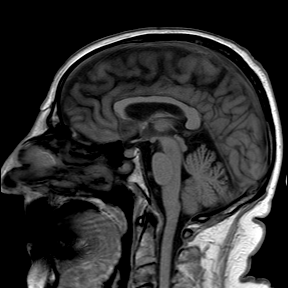
[im 23/23]
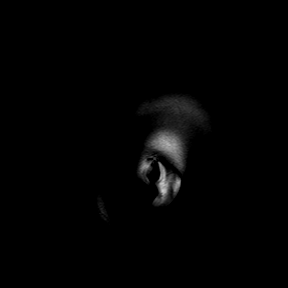

[Series 10: T2 · axial · 5.0mm · 0.72mm/px · z∈[-91,+61]mm · 3 of 23 slices shown (1 of 2)]
[im 1/23]
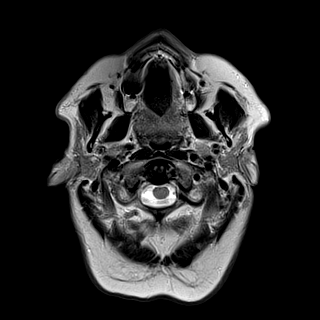
[im 12/23]
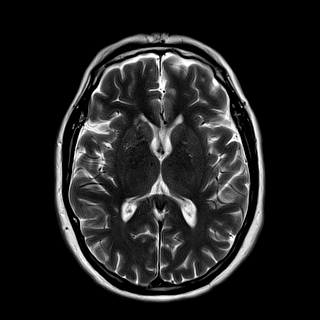
[im 23/23]
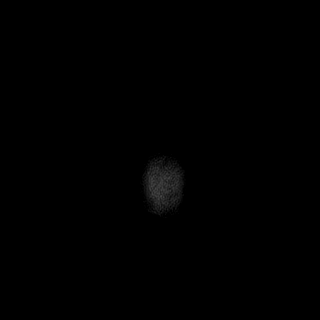

[Series 11: ax hemo · axial · 5.0mm · 0.86mm/px · z∈[-86,+56]mm · 4 of 25 slices shown]
[im 1/25]
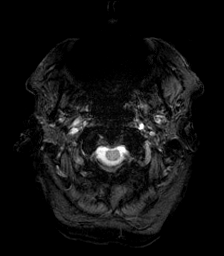
[im 9/25]
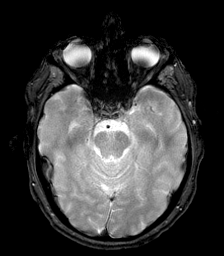
[im 17/25]
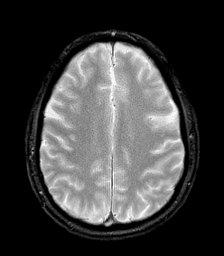
[im 25/25]
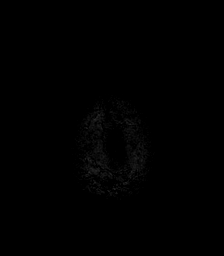

[Series 12: FLAIR · axial · 4.0mm · 0.43mm/px · z∈[-88,+58]mm · 5 of 38 slices shown]
[im 1/38]
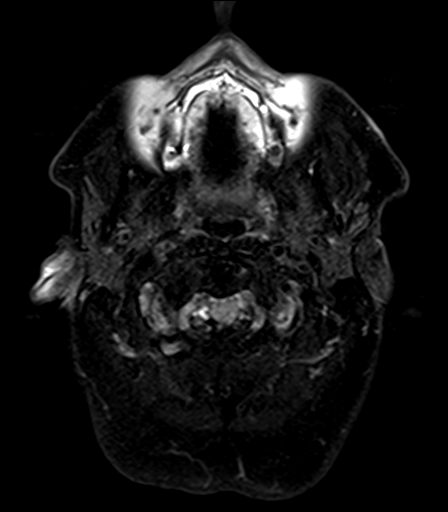
[im 10/38]
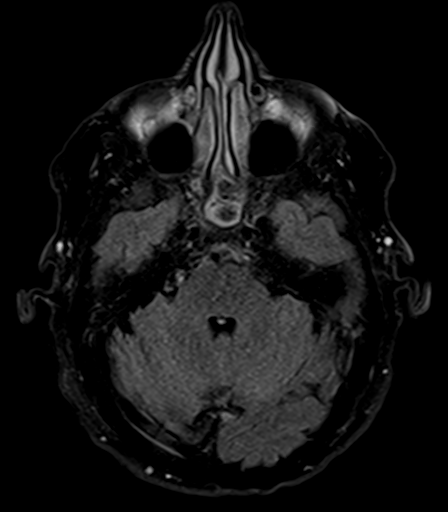
[im 19/38]
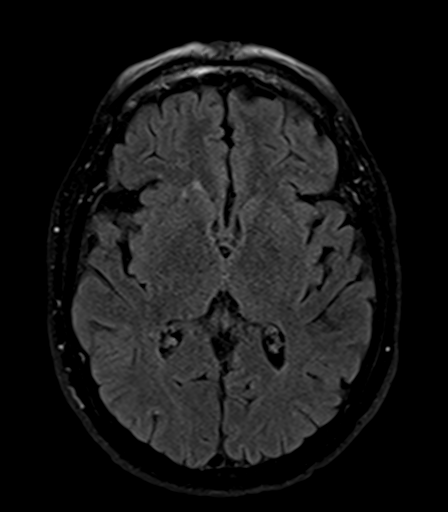
[im 28/38]
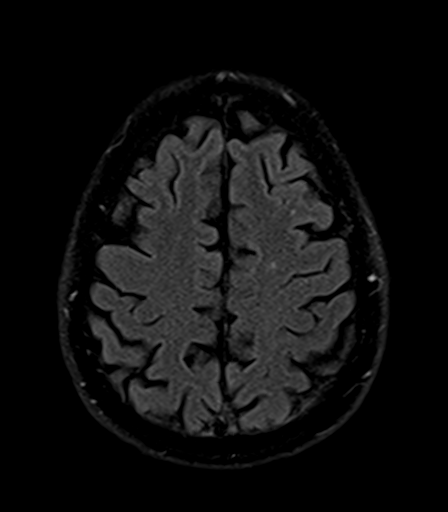
[im 38/38]
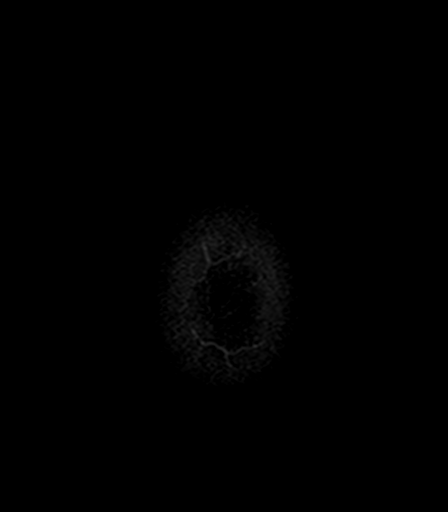

[Series 14: T2 · coronal · 5.0mm · 0.72mm/px · 4 of 28 slices shown (2 of 2)]
[im 1/28]
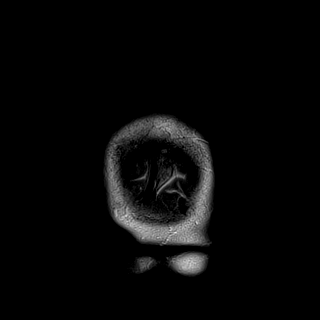
[im 10/28]
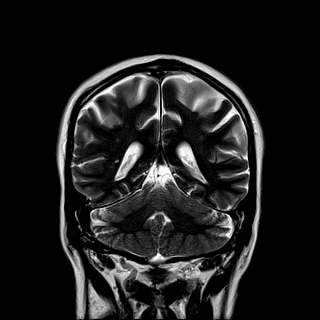
[im 19/28]
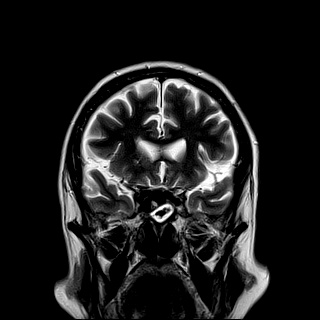
[im 28/28]
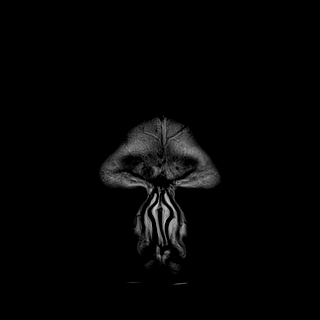

[39 of 48 positions shown; findings below may reference images not displayed]

FINDINGS: MRI HEAD FINDINGS

Brain:

Mild generalized cerebral and cerebellar atrophy.

Mild multifocal T2/FLAIR hyperintensity within the cerebral white
matter, nonspecific but compatible with chronic small vessel
ischemic disease.

There is no acute intracranial hemorrhage.

No demarcated cortical infarct.

No extra-axial fluid collection.

No evidence of an intracranial mass.

No midline shift.

Vascular: Expected proximal arterial flow voids.

Skull and upper cervical spine: No focal marrow lesion.

Sinuses/Orbits: Visualized orbits show no acute finding. Right lens
replacement. Frothy secretions and moderate mucosal thickening
within the left sphenoid sinus.

Other: Right mastoid effusion.

MRA HEAD FINDINGS

Anterior circulation:

The intracranial internal carotid arteries are patent. The M1 middle
cerebral arteries are patent. Atherosclerotic irregularity of the M2
and more distal middle cerebral artery vessels bilaterally. No M2
proximal branch occlusion is identified. The anterior cerebral
arteries are patent. No intracranial aneurysm is identified.

Posterior circulation:

The intracranial vertebral arteries are patent. The basilar artery
is patent. The posterior cerebral arteries are patent bilaterally
with atherosclerotic irregularity but without high-grade proximal
stenosis.

Anatomic variants: Posterior communicating arteries are hypoplastic
or absent bilaterally.
IMPRESSION: MRI brain:

1. No evidence of acute intracranial abnormality.
2. Mild generalized parenchymal atrophy.
3. Left sphenoid sinusitis.
4. Right mastoid effusion.

MRA head:

1. No intracranial large vessel occlusion or proximal high-grade
arterial stenosis.
2. Atherosclerotic irregularity of the M2 and more distal middle
cerebral artery vessels, and bilateral posterior cerebral arteries,
bilaterally.

## 2020-07-24 IMAGING — MR MR MRA HEAD W/O CM
1 series · 48 of 48 positions shown · non-contrast
Comparison: Head CT [DATE].

CLINICAL DATA: Neuro deficit, acute, stroke suspected. Additional
provided: Visual disturbances while driving.

EXAM:
MRI HEAD WITHOUT CONTRAST
MRA HEAD WITHOUT CONTRAST
TECHNIQUE: Multiplanar, multi-echo pulse sequences of the brain and surrounding
structures were acquired without intravenous contrast. Angiographic
images of the Circle of Willis were acquired using MRA technique
without intravenous contrast.

[Series 1: TOF · axial · 0.5mm · 0.76mm/px · z∈[-82,-5]mm · 48 of 168 slices shown]
[im 1/168]
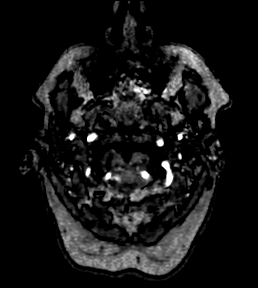
[im 4/168]
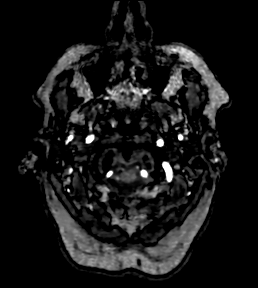
[im 8/168]
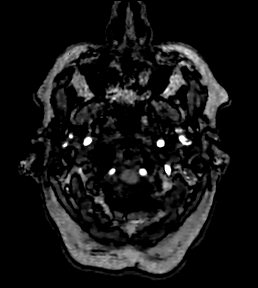
[im 11/168]
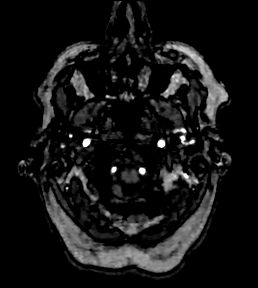
[im 15/168]
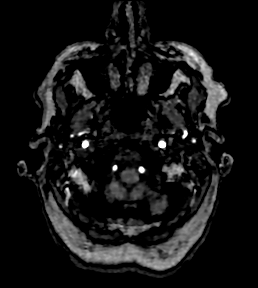
[im 18/168]
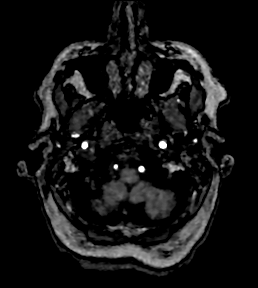
[im 22/168]
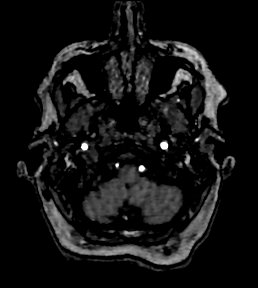
[im 25/168]
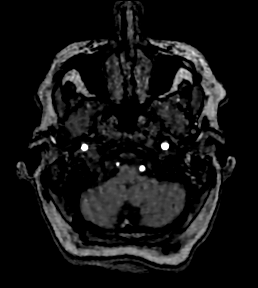
[im 29/168]
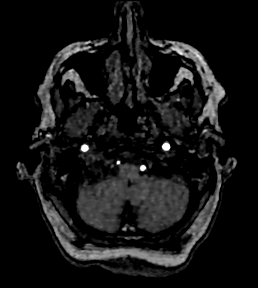
[im 32/168]
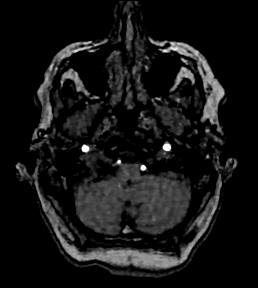
[im 36/168]
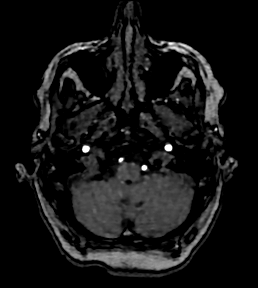
[im 40/168]
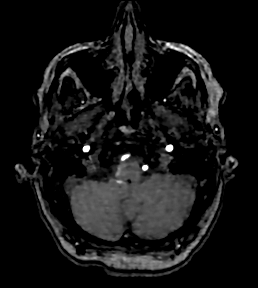
[im 43/168]
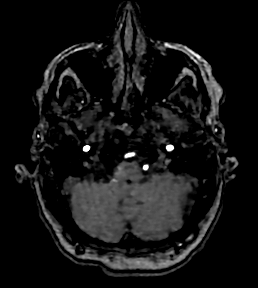
[im 47/168]
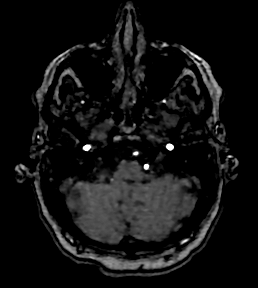
[im 50/168]
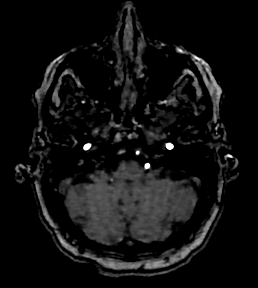
[im 54/168]
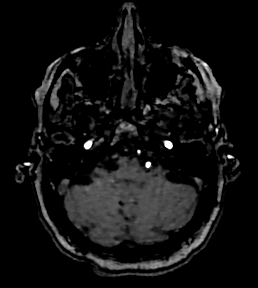
[im 57/168]
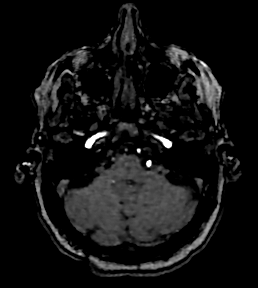
[im 61/168]
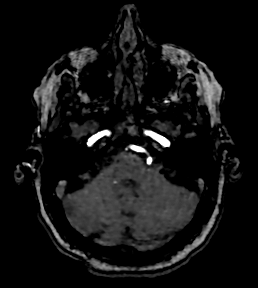
[im 64/168]
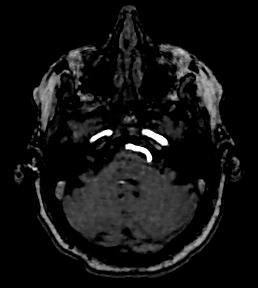
[im 68/168]
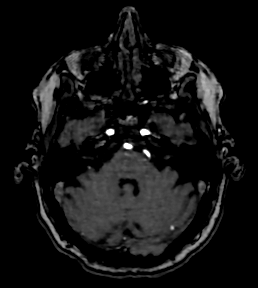
[im 72/168]
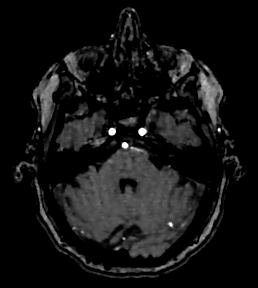
[im 75/168]
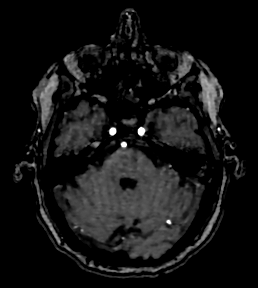
[im 79/168]
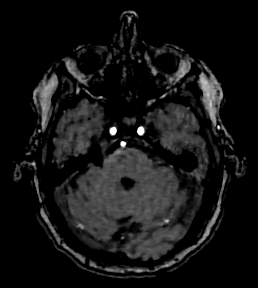
[im 82/168]
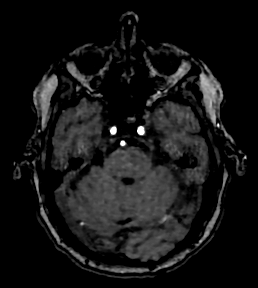
[im 86/168]
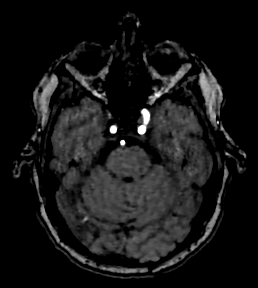
[im 89/168]
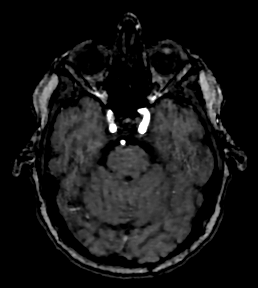
[im 93/168]
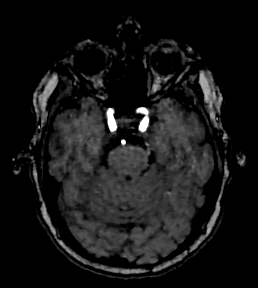
[im 96/168]
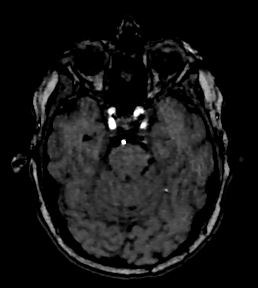
[im 100/168]
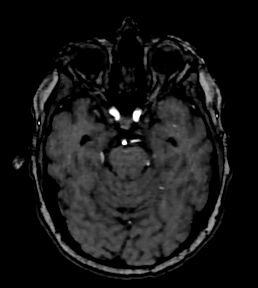
[im 104/168]
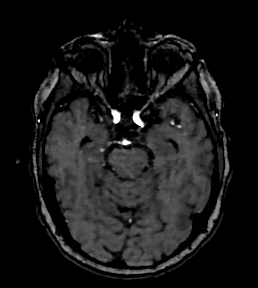
[im 107/168]
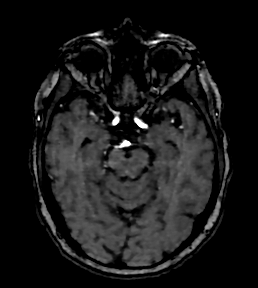
[im 111/168]
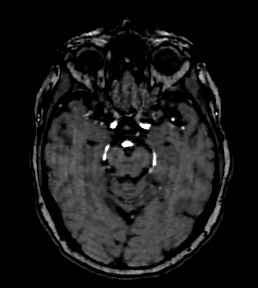
[im 114/168]
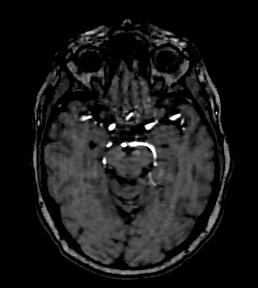
[im 118/168]
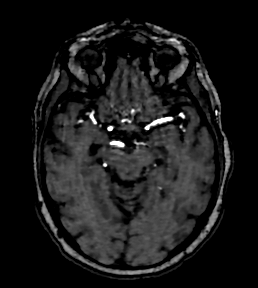
[im 121/168]
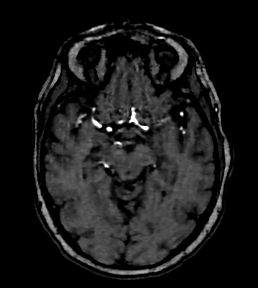
[im 125/168]
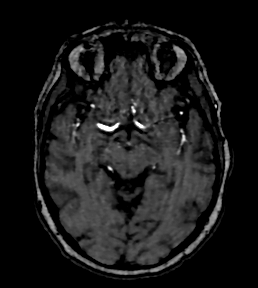
[im 128/168]
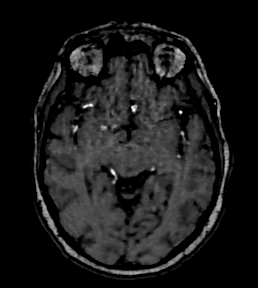
[im 132/168]
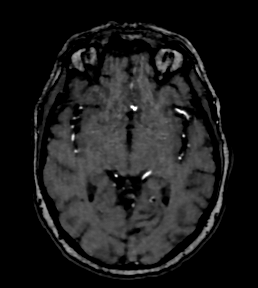
[im 136/168]
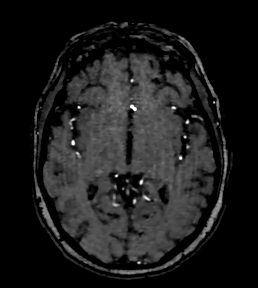
[im 139/168]
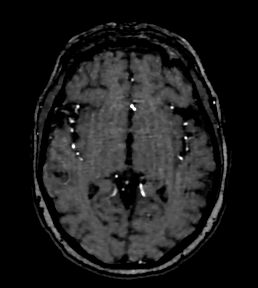
[im 143/168]
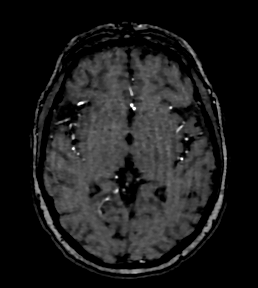
[im 146/168]
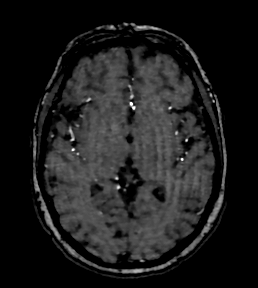
[im 150/168]
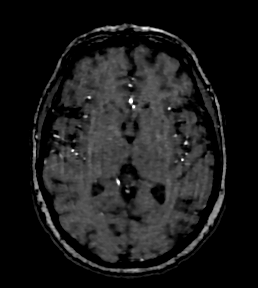
[im 153/168]
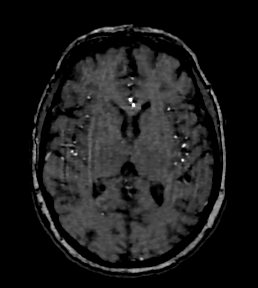
[im 157/168]
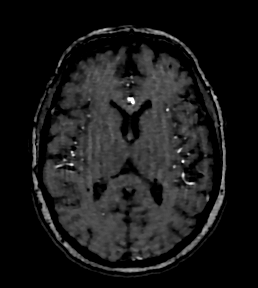
[im 160/168]
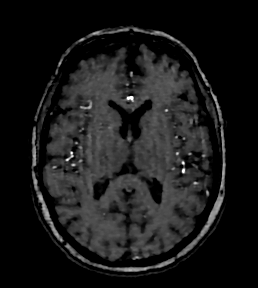
[im 164/168]
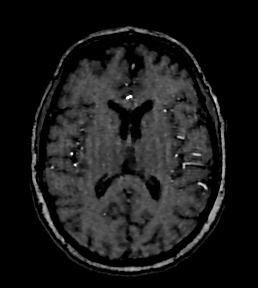
[im 168/168]
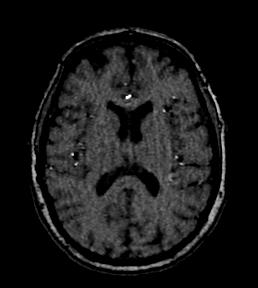

[48 of 48 positions shown; findings below may reference images not displayed]

FINDINGS: MRI HEAD FINDINGS

Brain:

Mild generalized cerebral and cerebellar atrophy.

Mild multifocal T2/FLAIR hyperintensity within the cerebral white
matter, nonspecific but compatible with chronic small vessel
ischemic disease.

There is no acute intracranial hemorrhage.

No demarcated cortical infarct.

No extra-axial fluid collection.

No evidence of an intracranial mass.

No midline shift.

Vascular: Expected proximal arterial flow voids.

Skull and upper cervical spine: No focal marrow lesion.

Sinuses/Orbits: Visualized orbits show no acute finding. Right lens
replacement. Frothy secretions and moderate mucosal thickening
within the left sphenoid sinus.

Other: Right mastoid effusion.

MRA HEAD FINDINGS

Anterior circulation:

The intracranial internal carotid arteries are patent. The M1 middle
cerebral arteries are patent. Atherosclerotic irregularity of the M2
and more distal middle cerebral artery vessels bilaterally. No M2
proximal branch occlusion is identified. The anterior cerebral
arteries are patent. No intracranial aneurysm is identified.

Posterior circulation:

The intracranial vertebral arteries are patent. The basilar artery
is patent. The posterior cerebral arteries are patent bilaterally
with atherosclerotic irregularity but without high-grade proximal
stenosis.

Anatomic variants: Posterior communicating arteries are hypoplastic
or absent bilaterally.
IMPRESSION: MRI brain:

1. No evidence of acute intracranial abnormality.
2. Mild generalized parenchymal atrophy.
3. Left sphenoid sinusitis.
4. Right mastoid effusion.

MRA head:

1. No intracranial large vessel occlusion or proximal high-grade
arterial stenosis.
2. Atherosclerotic irregularity of the M2 and more distal middle
cerebral artery vessels, and bilateral posterior cerebral arteries,
bilaterally.

## 2020-07-24 NOTE — ED Provider Notes (Signed)
[facility] Provider Note   CSN: [id number] Arrival date & time: 07/24/20  1642     History No chief complaint on file.   Shilo Pauwels is a 75 y.o. male.  Patient with history of lymphoma under treatment, presents to ER chief complaint of episode of double vision.  Symptoms started a few hours prior to arrival.  He states he was driving when he suddenly started seeing 2 of everything.  Denies any blurry vision or eye pain.  Symptoms lasted for about 2 hours and then have since resolved.  He states his vision is back to normal and the double vision has resolved.  He denies any headache or chest pain.  No abdominal pain.  No fevers no cough no vomiting no diarrhea.  Denies any new numbness weakness anywhere else.      Past Medical History:  Diagnosis Date   Acute pericarditis    a. 04/2013 -adm with CP, elevated CRP. H/o coronary artery calcification on prior CT but nuc was negative, EF 71%.   Arthritis    Atrial fibrillation ([place])    a. Isolated episode in the setting of acute pericarditis 04/2013. Was not placed on anticoag.   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   lt submandibular ln   Pain in joint, pelvic region and thigh 08/01/2013   PSVT (paroxysmal supraventricular tachycardia) [facility])     Patient Active Problem List   Diagnosis Date Noted   Lab test positive for detection of COVID-19 virus 07/03/2020   Basal cell carcinoma (BCC) of cheek 05/19/2020   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   Thrombocytopenia ([place]) 11/16/2019   Cancer associated pain 03/20/2019   Hypotension due to drugs 03/06/2019   Dyspnea on effort 01/26/2019   Excessive flatus 01/26/2019   Muscle cramp 08/16/2017   Prediabetes 07/11/2017   Other fatigue 02/03/2017   DDD (degenerative disc disease), lumbar 01/23/2016   Osteopenia 10/16/2015   Benign prostate hyperplasia 10/17/2014   Leukopenia 06/28/2014   Hyperlipidemia 11/27/2013   GERD (gastroesophageal reflux disease) 10/24/2013   PAF (paroxysmal atrial fibrillation) ([place]) 05/10/2013   History of PSVT (paroxysmal supraventricular tachycardia) 05/10/2013   Family history of coronary artery disease 05/10/2013   Coronary artery calcification seen on CAT scan 01/00/7121   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    Past Surgical History:  Procedure Laterality Date   CHOLECYSTECTOMY     EXPLORATORY LAPAROTOMY     EYE SURGERY Right 2016   cataract   LEFT HEART CATH AND CORONARY ANGIOGRAPHY N/A 06/05/2019   Procedure: LEFT HEART CATH AND CORONARY ANGIOGRAPHY;  Surgeon: Jettie Booze, MD;  Location: [facility];  Service: Cardiovascular;  Laterality: N/A;   LUNG LOBECTOMY     right side   LYMPH NODE BIOPSY     in groin with removal   MENISCUS REPAIR     right knee   PROSTATE SURGERY     REFRACTIVE SURGERY Right  piece of metal removed   SUBMANDIBULAR GLAND EXCISION  04/2011   SUBMANDIBULAR GLAND EXCISION  04/30/2011   Procedure: EXCISION SUBMANDIBULAR GLAND;  Surgeon: Jerrell Belfast, MD;  Location: [facility];  Service: ENT;  Laterality: Left;  WITH DIAGNOSTIC BIOPSY   TONSILLECTOMY     as a child   VEIN LIGATION AND STRIPPING     right leg       Family History  Problem Relation Age of Onset   Heart disease Father    Heart attack Father        x 3   Cancer Sister        breast ca   Cancer Brother        prostate ca   Heart attack Sister    Cancer Sister        breast   Heart disease  Brother    Alzheimer's disease Sister    Diabetes Sister    Heart disease Brother    Diabetes Brother    Heart disease Brother    Heart disease Brother    Heart disease Brother    Heart disease Brother    Heart disease Brother    Alzheimer's disease Brother    Diabetes Son    Anesthesia problems Neg Hx     Social History   Tobacco Use   Smoking status: Former    Packs/day: 1.50    Years: 25.00    Pack years: 37.50    Types: Cigarettes    Start date: 12/12/1962    Quit date: 01/11/1990    Years since quitting: 30.5   Smokeless tobacco: Never  Vaping Use   Vaping Use: Never used  Substance Use Topics   Alcohol use: No    Alcohol/week: 0.0 standard drinks   Drug use: No    Home Medications Prior to Admission medications   Medication Sig Start Date End Date Taking? Authorizing Provider  acyclovir (ZOVIRAX) 400 MG tablet TAKE 1 TABLET TWICE A DAY 05/12/20   [name], MD  apixaban (ELIQUIS) 5 MG TABS tablet Take 1 tablet (5 mg total) by mouth 2 (two) times daily. 11/21/19   Arnoldo Lenis, MD  brimonidine (ALPHAGAN) 0.2 % ophthalmic solution Place 1 drop into both eyes 2 (two) times daily.  02/08/19   [provider]  cholecalciferol (VITAMIN D3) 25 MCG (1000 UNIT) tablet Take 1,000 Units by mouth daily.    [provider]  clobetasol (OLUX) 0.05 % topical foam Apply 1 application topically 2 (two) times daily. 02/11/20   [provider]  flecainide (TAMBOCOR) 50 MG tablet Take 1 tablet (50 mg total) by mouth 2 (two) times daily. 06/06/20   Arnoldo Lenis, MD  idelalisib (ZYDELIG) 150 MG tablet TAKE 1 TABLET (150 MG TOTAL) BY MOUTH 2 (TWO) TIMES DAILY. 01/31/20 01/30/21  [name], MD  LUMIGAN 0.01 % SOLN Place 1 drop into both eyes daily. 03/07/17   [provider]  meclizine (ANTIVERT) 25 MG tablet Take 1 tablet (25 mg total) by mouth 3 (three) times daily as needed for dizziness. 10/19/19   Dettinger, [name], MD  metoprolol succinate  (TOPROL XL) 25 MG 24 hr tablet Take 0.5 tablets (12.5 mg total) by mouth daily. 07/08/20   Arnoldo Lenis, MD  Multiple Vitamin (MULTIVITAMIN WITH MINERALS) TABS tablet Take 1 tablet by mouth daily.     [provider]  NON FORMULARY Take 4 oz by mouth daily. Beet Root    [provider]  omeprazole (PRILOSEC) 20 MG capsule TAKE 1 CAPSULE DAILY Patient taking differently: Every other day 10/25/19   Dettinger, [name], MD  Probiotic Product (PROBIOTIC DAILY PO) Take 1 capsule by mouth daily.     [provider]  prochlorperazine (COMPAZINE) 10 MG tablet Take 1 tablet (10 mg total) by mouth every 6 (six) hours as needed for nausea or vomiting. 07/30/19   [name], MD  rosuvastatin (CRESTOR) 20 MG tablet Take 1 tablet (20 mg total) by mouth daily. 03/31/20   Arnoldo Lenis, MD  terazosin (HYTRIN) 2 MG capsule TAKE 1 CAPSULE AT BEDTIME 01/23/20   Dettinger, [name], MD  triamcinolone (KENALOG) 0.025 % cream Apply 1 application topically 2 (two) times daily. 02/11/20   [provider]  triamcinolone (KENALOG) 0.1 % Apply 1 application topically 2 (two) times daily. 01/14/20   [provider]    Allergies    Patient has no known allergies.  Review of Systems   Review of Systems  Constitutional:  Negative for fever.  HENT:  Negative for ear pain and sore throat.   Eyes:  Negative for pain.  Respiratory:  Negative for cough.   Cardiovascular:  Negative for chest pain.  Gastrointestinal:  Negative for abdominal pain.  Genitourinary:  Negative for flank pain.  Musculoskeletal:  Negative for back pain.  Skin:  Negative for color change and rash.  Neurological:  Negative for syncope.  All other systems reviewed and are negative.  Physical Exam Updated Vital Signs BP 109/78 (BP Location: Right Arm)   Pulse 78   Temp 98.2 F (36.8 C) (Oral)   Resp 20   SpO2 95%   Physical Exam Constitutional:      General: He is not in acute distress.     Appearance: He is well-developed.  HENT:     Head: Normocephalic.     Nose: Nose normal.  Eyes:     General: No scleral icterus.       Right eye: No discharge.        Left eye: No discharge.     Extraocular Movements: Extraocular movements intact.     Conjunctiva/sclera: Conjunctivae normal.     Pupils: Pupils are equal, round, and reactive to light.     Comments: Pupils equal and reactive bilaterally.  No APD noted.  Cardiovascular:     Rate and Rhythm: Normal rate.  Pulmonary:     Effort: Pulmonary effort is normal.  Skin:    Coloration: Skin is not jaundiced.  Neurological:     Mental Status: He is alert. Mental status is at baseline.    ED Results / Procedures / Treatments   Labs (all labs ordered are listed, but only abnormal results are displayed) Labs Reviewed - No data to display  EKG None  Radiology MR ANGIO HEAD WO CONTRAST  Result Date: 07/24/2020 CLINICAL DATA:  Neuro deficit, acute, stroke suspected. Additional provided: Visual disturbances while driving. EXAM: MRI HEAD WITHOUT CONTRAST MRA HEAD WITHOUT CONTRAST TECHNIQUE: Multiplanar, multi-echo pulse sequences of the brain and surrounding structures were acquired without intravenous contrast. Angiographic images of the Circle of Willis were acquired using MRA technique without intravenous contrast. COMPARISON:  Head CT 10/25/2004. FINDINGS: MRI HEAD FINDINGS Brain: Mild generalized cerebral and cerebellar atrophy. Mild multifocal T2/FLAIR hyperintensity within the cerebral white matter, nonspecific but compatible with chronic small vessel ischemic disease. There is no acute intracranial hemorrhage. No demarcated cortical infarct. No extra-axial fluid collection. No evidence of an intracranial mass. No midline  shift. Vascular: Expected proximal arterial flow voids. Skull and upper cervical spine: No focal marrow lesion. Sinuses/Orbits: Visualized orbits show no acute finding. Right lens replacement. Frothy secretions  and moderate mucosal thickening within the left sphenoid sinus. Other: Right mastoid effusion. MRA HEAD FINDINGS Anterior circulation: The intracranial internal carotid arteries are patent. The M1 middle cerebral arteries are patent. Atherosclerotic irregularity of the M2 and more distal middle cerebral artery vessels bilaterally. No M2 proximal branch occlusion is identified. The anterior cerebral arteries are patent. No intracranial aneurysm is identified. Posterior circulation: The intracranial vertebral arteries are patent. The basilar artery is patent. The posterior cerebral arteries are patent bilaterally with atherosclerotic irregularity but without high-grade proximal stenosis. Anatomic variants: Posterior communicating arteries are hypoplastic or absent bilaterally. IMPRESSION: MRI brain: 1. No evidence of acute intracranial abnormality. 2. Mild generalized parenchymal atrophy. 3. Left sphenoid sinusitis. 4. Right mastoid effusion. MRA head: 1. No intracranial large vessel occlusion or proximal high-grade arterial stenosis. 2. Atherosclerotic irregularity of the M2 and more distal middle cerebral artery vessels, and bilateral posterior cerebral arteries, bilaterally. Electronically Signed   By: Kellie Simmering DO   On: 07/24/2020 18:27   MR BRAIN WO CONTRAST  Result Date: 07/24/2020 CLINICAL DATA:  Neuro deficit, acute, stroke suspected. Additional provided: Visual disturbances while driving. EXAM: MRI HEAD WITHOUT CONTRAST MRA HEAD WITHOUT CONTRAST TECHNIQUE: Multiplanar, multi-echo pulse sequences of the brain and surrounding structures were acquired without intravenous contrast. Angiographic images of the Circle of Willis were acquired using MRA technique without intravenous contrast. COMPARISON:  Head CT 10/25/2004. FINDINGS: MRI HEAD FINDINGS Brain: Mild generalized cerebral and cerebellar atrophy. Mild multifocal T2/FLAIR hyperintensity within the cerebral white matter, nonspecific but compatible  with chronic small vessel ischemic disease. There is no acute intracranial hemorrhage. No demarcated cortical infarct. No extra-axial fluid collection. No evidence of an intracranial mass. No midline shift. Vascular: Expected proximal arterial flow voids. Skull and upper cervical spine: No focal marrow lesion. Sinuses/Orbits: Visualized orbits show no acute finding. Right lens replacement. Frothy secretions and moderate mucosal thickening within the left sphenoid sinus. Other: Right mastoid effusion. MRA HEAD FINDINGS Anterior circulation: The intracranial internal carotid arteries are patent. The M1 middle cerebral arteries are patent. Atherosclerotic irregularity of the M2 and more distal middle cerebral artery vessels bilaterally. No M2 proximal branch occlusion is identified. The anterior cerebral arteries are patent. No intracranial aneurysm is identified. Posterior circulation: The intracranial vertebral arteries are patent. The basilar artery is patent. The posterior cerebral arteries are patent bilaterally with atherosclerotic irregularity but without high-grade proximal stenosis. Anatomic variants: Posterior communicating arteries are hypoplastic or absent bilaterally. IMPRESSION: MRI brain: 1. No evidence of acute intracranial abnormality. 2. Mild generalized parenchymal atrophy. 3. Left sphenoid sinusitis. 4. Right mastoid effusion. MRA head: 1. No intracranial large vessel occlusion or proximal high-grade arterial stenosis. 2. Atherosclerotic irregularity of the M2 and more distal middle cerebral artery vessels, and bilateral posterior cerebral arteries, bilaterally. Electronically Signed   By: Kellie Simmering DO   On: 07/24/2020 18:27    Procedures Procedures   Medications Ordered in ED Medications - No data to display  ED Course  I have reviewed the triage vital signs and the nursing notes.  Pertinent labs & imaging results that were available during my care of the patient were reviewed by me  and considered in my medical decision making (see chart for details).    MDM Rules/Calculators/A&P  Patient symptoms have completely resolved and is back to baseline.  Visual fields are intact bilateral eyes.  Concern for possible TIA-like symptoms.  MRI otherwise unremarkable.  Will recommend outpatient follow-up with his doctor within the week.  Advising immediate return if he has any new symptoms fevers pain or any additional concerns.  Final Clinical Impression(s) / ED Diagnoses Final diagnoses:  History of double vision    Rx / DC Orders ED Discharge Orders     None        Luna Fuse, MD 07/24/20 [id number]

## 2020-07-24 NOTE — Discharge Instructions (Addendum)
Call your primary care doctor or specialist as discussed in the next 2-3 days.   Return immediately back to the ER if:  Your symptoms worsen within the next 12-24 hours. You develop new symptoms such as new fevers, persistent vomiting, new pain, shortness of breath, or new weakness or numbness, or if you have any other concerns.  

## 2020-07-24 NOTE — ED Triage Notes (Signed)
ADVISED TO COME IN FOR EVALUATION BY HIS ONCOLOGIST DUE TO VISUAL DISTURBANCES WHILE DRIVING 2 HOURS AGO, states he is back to normal now

## 2020-07-24 NOTE — Telephone Encounter (Signed)
MD reviewed below message from patient sent via mychart   "Anthony Kelly is having his CT scan and labs at [facility] instead of [place] because it's much closer to home.  The scan has been changed but labs haven't. I was told to cancel the labs at [facility] but leave the orders.  I wasn't sure who to call for this.  Also, today, Anthony Kelly started having double vision and couldn't even drive the car.  It lasted about an hour.  Should the scan include his head?"   MD recommendations for patient to report to ED to be evaluated due to double vision.  RN notified patient's wife, she verbalized she would encourage him to go.  RN educated importance of evaluation to rule out anything neurological.  She verbalized understanding.

## 2020-07-25 ENCOUNTER — Encounter: Payer: Self-pay | Admitting: Family Medicine

## 2020-07-25 ENCOUNTER — Other Ambulatory Visit: Payer: Self-pay | Admitting: Cardiology

## 2020-07-25 NOTE — Telephone Encounter (Signed)
Pt's age 75, wt 89.7 kg, SCr 1.22, CrCl 65.93, last ov w/ JB 02/13/20.

## 2020-07-30 ENCOUNTER — Other Ambulatory Visit (HOSPITAL_COMMUNITY)
Admission: RE | Admit: 2020-07-30 | Discharge: 2020-07-30 | Disposition: A | Discharge status: Home / Self Care | Payer: Medicare Other | Source: Ambulatory Visit | Attending: Hematology and Oncology | Admitting: Hematology and Oncology

## 2020-07-30 ENCOUNTER — Other Ambulatory Visit: Payer: Medicare Other

## 2020-07-30 ENCOUNTER — Ambulatory Visit (HOSPITAL_COMMUNITY): Payer: Medicare Other

## 2020-07-30 ENCOUNTER — Ambulatory Visit (HOSPITAL_COMMUNITY)
Admission: RE | Admit: 2020-07-30 | Discharge: 2020-07-30 | Disposition: A | Discharge status: Home / Self Care | Payer: Medicare Other | Source: Ambulatory Visit | Attending: Hematology and Oncology | Admitting: Hematology and Oncology

## 2020-07-30 ENCOUNTER — Other Ambulatory Visit: Payer: Self-pay

## 2020-07-30 DIAGNOSIS — C8280 Other types of follicular lymphoma, unspecified site: Secondary | ICD-10-CM

## 2020-07-30 DIAGNOSIS — J439 Emphysema, unspecified: Secondary | ICD-10-CM | POA: Diagnosis not present

## 2020-07-30 DIAGNOSIS — C44319 Basal cell carcinoma of skin of other parts of face: Secondary | ICD-10-CM | POA: Insufficient documentation

## 2020-07-30 DIAGNOSIS — I2510 Atherosclerotic heart disease of native coronary artery without angina pectoris: Secondary | ICD-10-CM | POA: Diagnosis not present

## 2020-07-30 DIAGNOSIS — N281 Cyst of kidney, acquired: Secondary | ICD-10-CM | POA: Diagnosis not present

## 2020-07-30 DIAGNOSIS — C7800 Secondary malignant neoplasm of unspecified lung: Secondary | ICD-10-CM | POA: Diagnosis not present

## 2020-07-30 DIAGNOSIS — D696 Thrombocytopenia, unspecified: Secondary | ICD-10-CM

## 2020-07-30 DIAGNOSIS — K5730 Diverticulosis of large intestine without perforation or abscess without bleeding: Secondary | ICD-10-CM | POA: Diagnosis not present

## 2020-07-30 DIAGNOSIS — K7689 Other specified diseases of liver: Secondary | ICD-10-CM | POA: Diagnosis not present

## 2020-07-30 DIAGNOSIS — K449 Diaphragmatic hernia without obstruction or gangrene: Secondary | ICD-10-CM | POA: Diagnosis not present

## 2020-07-30 LAB — CBC WITH DIFFERENTIAL/PLATELET
Abs Immature Granulocytes: 0.03 10*3/uL (ref 0.00–0.07)
Basophils Absolute: 0.1 10*3/uL (ref 0.0–0.1)
Basophils Relative: 1 %
Eosinophils Absolute: 0.8 10*3/uL — ABNORMAL HIGH (ref 0.0–0.5)
Eosinophils Relative: 10 %
HCT: 46 % (ref 39.0–52.0)
Hemoglobin: 15.7 g/dL (ref 13.0–17.0)
Immature Granulocytes: 0 %
Lymphocytes Relative: 31 %
Lymphs Abs: 2.6 10*3/uL (ref 0.7–4.0)
MCH: 31.7 pg (ref 26.0–34.0)
MCHC: 34.1 g/dL (ref 30.0–36.0)
MCV: 92.7 fL (ref 80.0–100.0)
Monocytes Absolute: 0.9 10*3/uL (ref 0.1–1.0)
Monocytes Relative: 11 %
Neutro Abs: 4.2 10*3/uL (ref 1.7–7.7)
Neutrophils Relative %: 47 %
Platelets: 187 10*3/uL (ref 150–400)
RBC: 4.96 MIL/uL (ref 4.22–5.81)
RDW: 12.7 % (ref 11.5–15.5)
WBC: 8.6 10*3/uL (ref 4.0–10.5)
nRBC: 0 % (ref 0.0–0.2)

## 2020-07-30 LAB — COMPREHENSIVE METABOLIC PANEL
ALT: 24 U/L (ref 0–44)
AST: 27 U/L (ref 15–41)
Albumin: 4.1 g/dL (ref 3.5–5.0)
Alkaline Phosphatase: 54 U/L (ref 38–126)
Anion gap: 9 (ref 5–15)
BUN: 16 mg/dL (ref 8–23)
CO2: 25 mmol/L (ref 22–32)
Calcium: 8.8 mg/dL — ABNORMAL LOW (ref 8.9–10.3)
Chloride: 100 mmol/L (ref 98–111)
Creatinine, Ser: 1.19 mg/dL (ref 0.61–1.24)
GFR, Estimated: >60 mL/min (ref >60)
Glucose, Bld: 90 mg/dL (ref 70–99)
Potassium: 4.1 mmol/L (ref 3.5–5.1)
Sodium: 134 mmol/L — ABNORMAL LOW (ref 135–145)
Total Bilirubin: 0.8 mg/dL (ref 0.3–1.2)
Total Protein: 6.7 g/dL (ref 6.5–8.1)

## 2020-07-30 IMAGING — CT CT CHEST-ABD-PELV W/ CM
2 of 5 series · 13 of 36 positions shown, 15 images · IV contrast (Omnipaque or Isovue)
Comparison: Multiple priors including most recent CT [DATE]

CLINICAL DATA: History of low-grade B-cell lymphoma with lung
metastases.

EXAM:
CT CHEST, ABDOMEN, AND PELVIS WITH CONTRAST
TECHNIQUE: Multidetector CT imaging of the chest, abdomen and pelvis was
performed following the standard protocol during bolus
administration of intravenous contrast.
CONTRAST:  100mL OMNIPAQUE IOHEXOL 300 MG/ML  SOLN

[Series 2: cap with · axial · 0.88mm/px · z∈[+824,+1384]mm · 10 of 138 slices shown, 12 images]
[im 13/138  mediastinal]
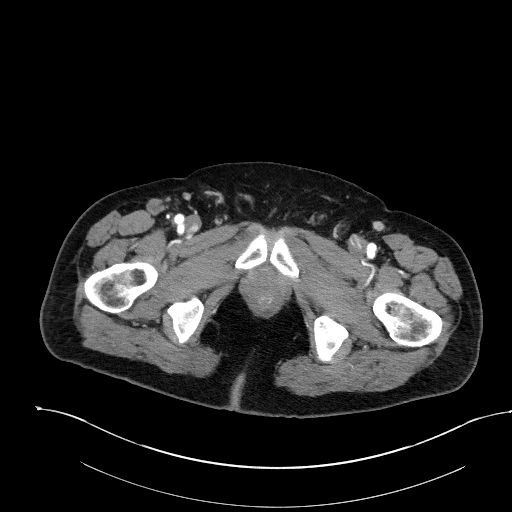
[im 13/138  bone]
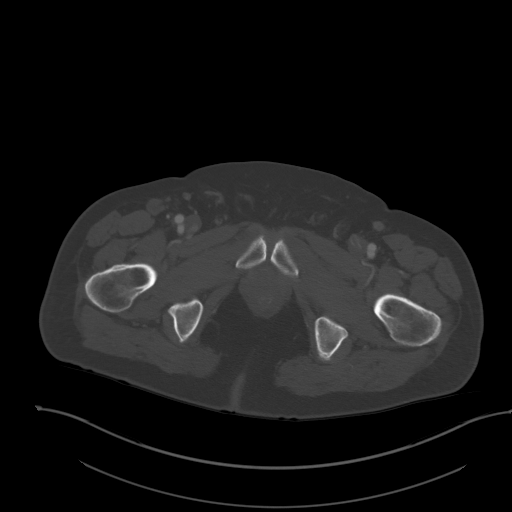
[im 25/138  mediastinal]
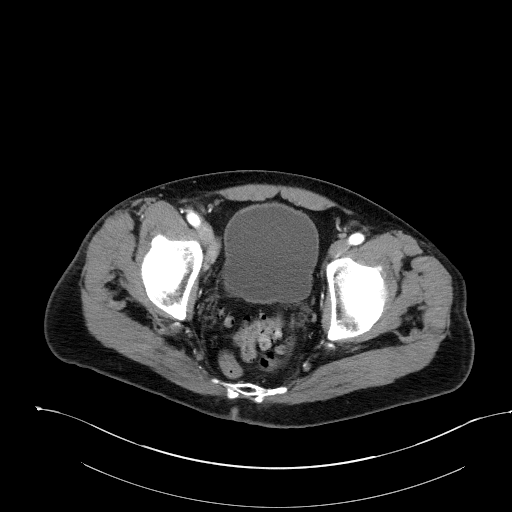
[im 38/138  mediastinal]
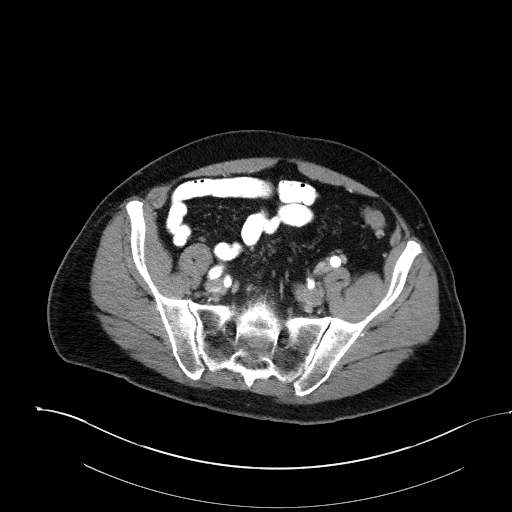
[im 50/138  mediastinal]
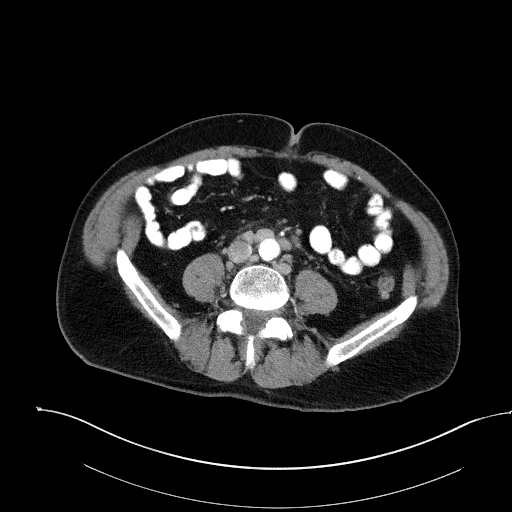
[im 63/138  mediastinal]
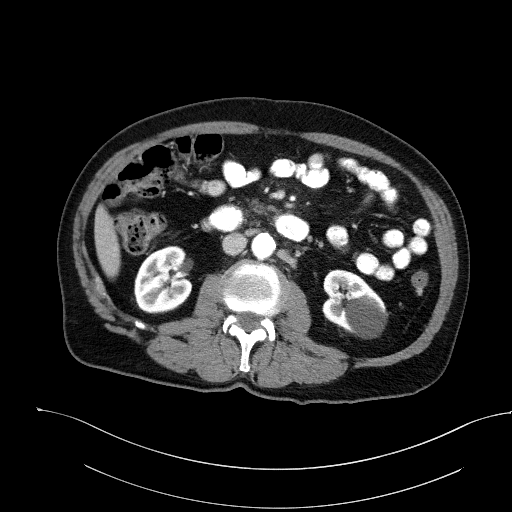
[im 75/138  mediastinal]
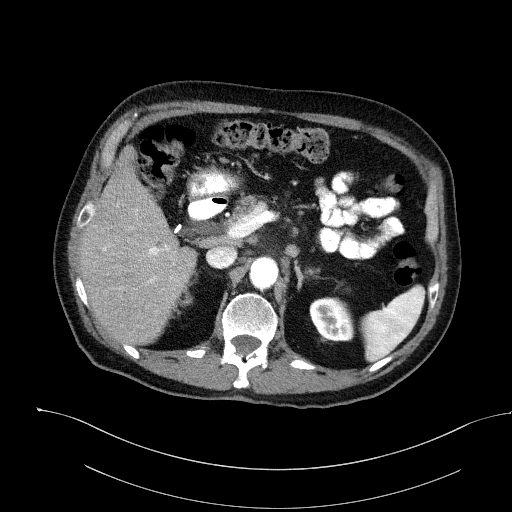
[im 88/138  mediastinal]
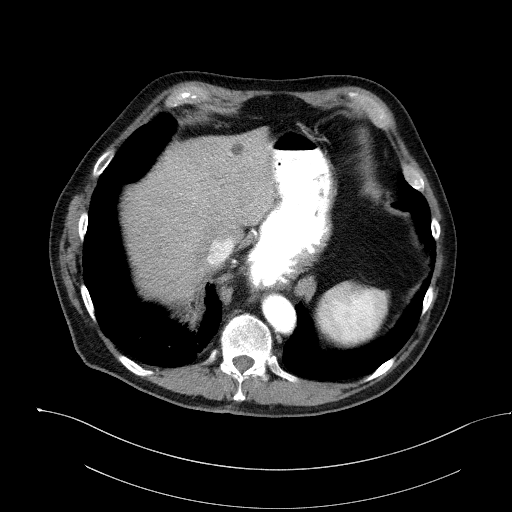
[im 100/138  mediastinal]
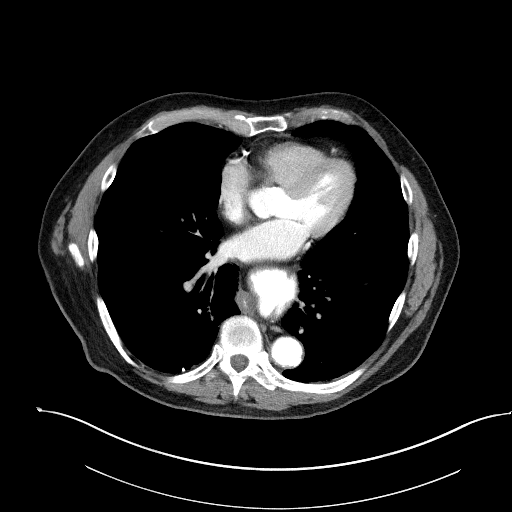
[im 113/138  mediastinal]
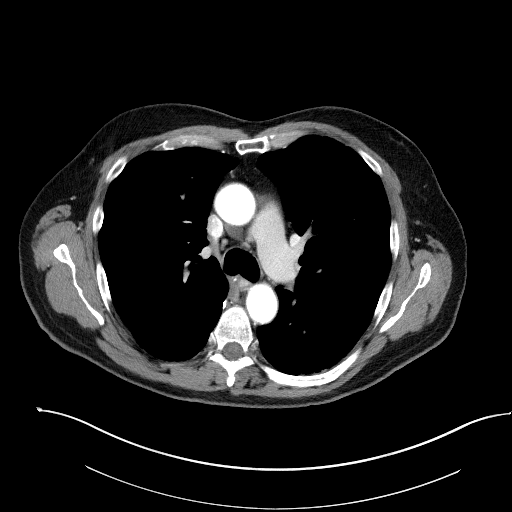
[im 113/138  bone]
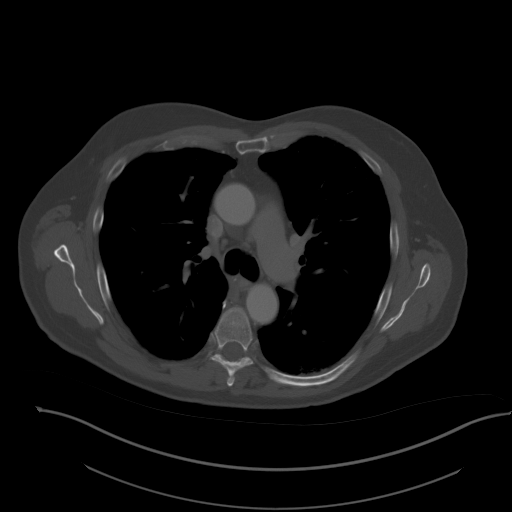
[im 125/138  mediastinal]
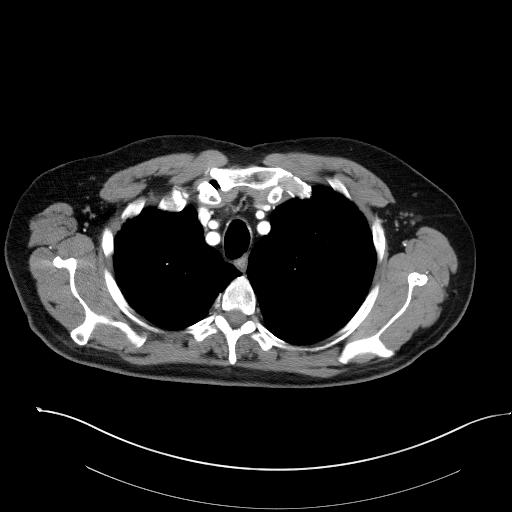

[Series 4: coronals · coronal · 0.82mm/px · 3 of 173 slices shown]
[im 35/173  mediastinal]
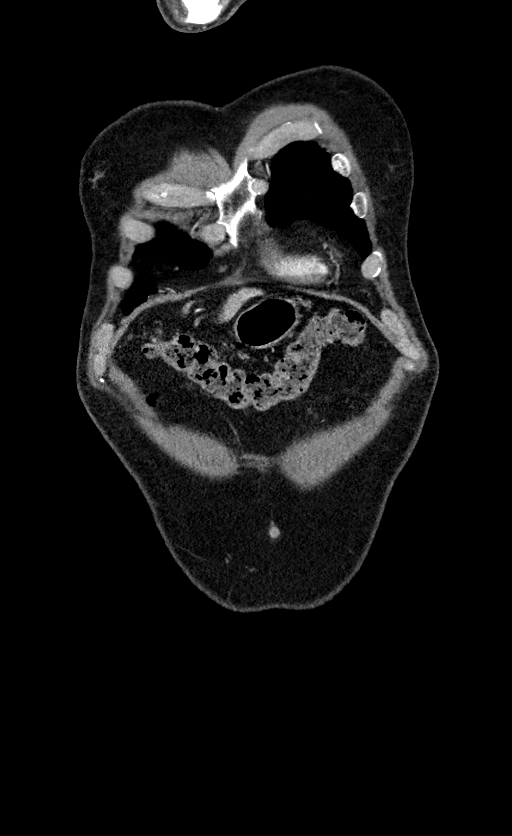
[im 69/173  mediastinal]
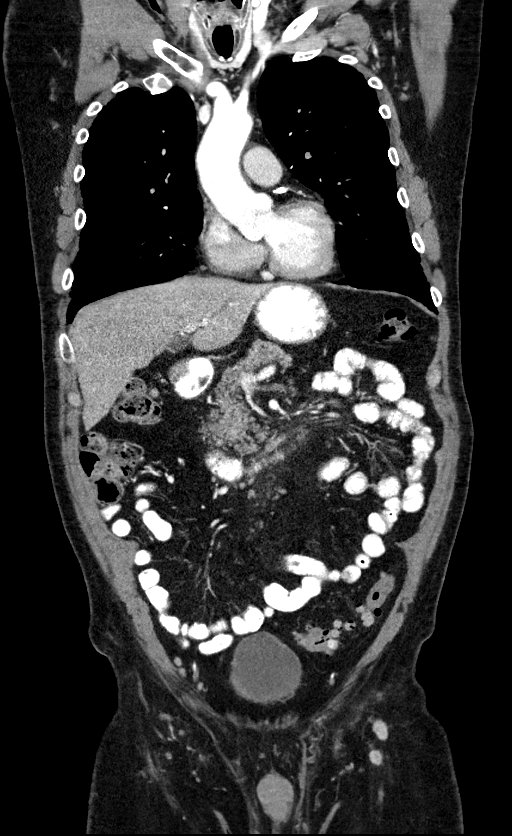
[im 104/173  mediastinal]
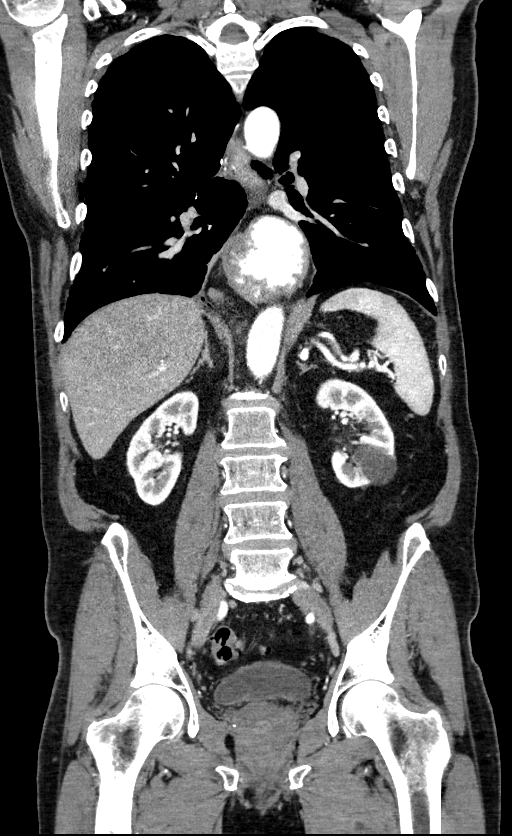

[13 of 36 positions shown; findings below may reference images not displayed]

FINDINGS: CT CHEST FINDINGS

Cardiovascular: Aortic atherosclerosis without aneurysmal dilation.
No central pulmonary embolus. Coronary artery calcifications. Normal
size heart. No significant pericardial effusion/thickening.

Mediastinum/Nodes: No discrete thyroid nodularity. No pathologically
enlarged mediastinal, hilar or axillary lymph nodes. No
supraclavicular adenopathy. Large hiatal hernia.

Lungs/Pleura: Linear suture line in the right lung along the major
fissure without suspicious nodularity. No suspicious pulmonary
nodules or masses. Paraseptal emphysema. Scattered subsegmental
atelectasis.

Musculoskeletal: Nonenhancing fluid delete that density nonenhancing
nodularity extending from neural foramina at multiple levels for
instance the right T10-11, bilateral neural foramina at T11-12 and
left greater than right T12-L1, which appears similar over multiple
priors and were non hypermetabolic on prior PET-CT and consistent
with perineural root sleeve cyst. No aggressive lytic or blastic
lesion of bone.

CT ABDOMEN PELVIS FINDINGS

Hepatobiliary: Benign cysts in the left hepatic lobe. Gallbladder
surgically absent. Similar prominence of the biliary tree,
consistent with reservoir effect post cholecystectomy.

Pancreas: Within normal limits.

Spleen: Normal size spleen.  No focal splenic lesion.

Adrenals/Urinary Tract: Adrenal glands are unremarkable. Hypodense
left renal cysts measuring up to 3.9 cm. Kidneys are otherwise
normal, without renal calculi, solid enhancing lesion, or
hydronephrosis. Bladder is unremarkable.

Stomach/Bowel: Large hiatal hernia otherwise stomach is
unremarkable. Radiopaque enteric contrast traverses the distal small
bowel. No pathologic dilation of small bowel. No suspicious small
bowel wall thickening. The appendix is not definitely visualized
however there is no pericecal inflammation. Delete that left-sided
colonic diverticulosis without findings of acute diverticulitis.

Vascular/Lymphatic: Aortic atherosclerosis without aneurysmal
dilation.

Similar size of prominent/mildly enlarged retroperitoneal
abdominopelvic lymph nodes. No new areas of suspicious adenopathy.
Indexed lymph nodes are as follows:

Left periaortic lymph node measures 9 mm on image 84/2, previously
10 mm.

Right common iliac lymph node measures 13 mm on image 95/2
previously 12 mm when remeasured for consistency.

Left common iliac lymph node measures 12 mm on image 95/2,
previously 13 mm.

Reproductive: Prostate is unremarkable.

Other: No abdominopelvic ascites.

Musculoskeletal: No aggressive lytic or blastic lesion of bone.
L5-S1 discogenic disease.
IMPRESSION: 1. Stable examination. No significant interval change in the
prominent/mildly enlarged lymph nodes below the diaphragm. No
thoracic adenopathy. No splenomegaly.
2. Postsurgical change of right lung wedge resection without
evidence of local recurrence.
3. Large hiatal hernia.
4. Colonic diverticulosis without findings of acute diverticulitis.
5. Aortic Atherosclerosis ([04]-[04]) and Emphysema ([04]-[04]).

## 2020-07-30 MED ORDER — IOHEXOL 300 MG/ML  SOLN
100.0000 mL | Freq: Once | INTRAMUSCULAR | Status: AC | PRN
  Administered 2020-07-30: 100 mL via INTRAVENOUS

## 2020-07-31 DIAGNOSIS — H4922 Sixth [abducent] nerve palsy, left eye: Secondary | ICD-10-CM | POA: Diagnosis not present

## 2020-08-01 ENCOUNTER — Other Ambulatory Visit: Payer: Self-pay

## 2020-08-01 ENCOUNTER — Encounter: Payer: Self-pay | Admitting: Hematology and Oncology

## 2020-08-01 ENCOUNTER — Inpatient Hospital Stay: Payer: Medicare Other | Attending: Hematology and Oncology | Admitting: Hematology and Oncology

## 2020-08-01 DIAGNOSIS — H539 Unspecified visual disturbance: Secondary | ICD-10-CM | POA: Insufficient documentation

## 2020-08-01 DIAGNOSIS — C8280 Other types of follicular lymphoma, unspecified site: Secondary | ICD-10-CM

## 2020-08-01 DIAGNOSIS — C8510 Unspecified B-cell lymphoma, unspecified site: Secondary | ICD-10-CM | POA: Diagnosis not present

## 2020-08-01 NOTE — Assessment & Plan Note (Addendum)
I have reviewed CT imaging and blood work with the patient and family There is no signs of active disease The plan will be to continue treatment indefinitely I will see him again in 3 months for further follow-up I plan to repeat CT imaging again in 6 months for further follow-up

## 2020-08-01 NOTE — Progress Notes (Signed)
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   All questions were answered. The patient knows to call the clinic with any problems, [name], [name], MD 08/02/2020 10:03 AM  INTERVAL HISTORY: Please see below for problem oriented charting. 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    12/12/2003 - 06/01/2004 Chemotherapy   He was treated with R. CHOP chemotherapy which show complete remission. The number of cycles of R. CHOP chemotherapy was unknown.    12/19/2006 Surgery   Lung resection show follicular lymphoma.    01/02/2007 - 09/01/2008 Chemotherapy   The patient was treated with single agent rituximab alone.    01/19/2007 Bone Marrow Biopsy   Bone marrow biopsy was negative.    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    05/08/2013 - 05/11/2013 Hospital Admission   The patient was admitted to the hospital for management of pericarditis. CT scan showed extensive lymphadenopathy.    06/07/2013 Imaging   PET/CT scan showed extensive lymphadenopathy    06/25/2013 Procedure   He has placement of Infuse-a-Port.    06/28/2013 Bone Marrow Biopsy   Bone marrow biopsy is positive for lymphoma involvement.    07/04/2013 - 11/22/2013 Chemotherapy   He is treated with 6 cycles of bendamustine with rituximab.    09/24/2013 Imaging   Repeat PET scan show complete remission.    12/26/2013 Imaging   PEt scan showed complete remission    09/26/2014 Imaging   CT scan of the chest abdomen and pelvis show no evidence of disease    03/26/2015 Imaging   CT scan showed no evidence of lymphoma    04/14/2016 Imaging   CT: Borderline prominent right hilar and subcarinal lymph nodes, but not appreciably changed. 2. Low-grade but increased central mesenteric stranding with some small mesenteric lymph nodes. This could certainly be inflammatory, and there is  no bulky adenopathy to suggest a malignant etiology.  3. Coronary and aortoiliac atherosclerotic calcification. 4. Centrilobular and paraseptal emphysema. Postoperative findings in the right lung. 5. 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,  much of which is likely a physiologic response to cholecystectomy. 7. Sigmoid colon diverticulosis. 8.  Prominent stool throughout the colon favors constipation. 9. Enlarged prostate gland, volume estimated at 75 cubic cm.    02/15/2017 Imaging   No evidence of recurrent lymphoma or other acute findings.  Stable moderate hiatal hernia.  Colonic diverticulosis, without radiographic evidence of diverticulitis.  Stable mildly enlarged prostate.  Mild emphysema.  Aortic and coronary artery atherosclerosis.    01/31/2019 Imaging   1. Interval development of abdominal and pelvic adenopathy compatible with recurrent lymphoma. 2. Emphysema and aortic atherosclerosis. 3. Multi vessel coronary artery calcifications. 4. Moderate to large hiatal hernia   02/09/2019 Procedure   Successful CT-guided core biopsy of the left retroperitoneal periaortic adenopathy   02/09/2019 Pathology Results   SURGICAL PATHOLOGY  CASE: [id number]  PATIENT: [name]  Surgical Pathology Report   Clinical History: Lymphoma; [name], LEFT PARA AORTIC, [name], cleaved nuclei and scant cytoplasm.  By immunohistochemistry, [name], [name], [name], [name] is low (less than 10%).  Flow cytometry was attempted; however, there was insufficient material for analysis (see [id number]).  Overall, the features are consistent with relapse of the patient's previously diagnosed follicular  lymphoma.  Based on the biopsy, this is favored to be a low-grade follicular lymphoma   07/13/7104 -  Chemotherapy   The patient had idelisib for chemotherapy treatment.     05/17/2019 Imaging   1. Interval generalized decrease in abdominopelvic lymphadenopathy identified on the previous study. No new or progressive lymphadenopathy on today's study. 2. Moderate hiatal hernia. 3.  Emphysema (ICD10-J43.9) and Aortic Atherosclerosis (ICD10-170.0)   11/16/2019 Imaging   IMPRESSION: Chest Impression:   1. No mediastinal lymphadenopathy. 2. Post RIGHT lung wedge resection without evidence local recurrence.   Abdomen / Pelvis Impression:   1. Stable numerous small periaortic retroperitoneal nodes and common iliac nodes. No progression of adenopathy. 2. No splenomegaly.  No skeletal metastasis.     07/31/2020 Imaging   1. Stable examination. No significant interval change in the prominent/mildly enlarged lymph nodes below the diaphragm. No thoracic adenopathy. No splenomegaly. 2. Postsurgical change of right lung wedge resection without evidence of local recurrence. 3. Large hiatal hernia. 4. Colonic diverticulosis without findings of acute diverticulitis. 5. Aortic Atherosclerosis (ICD10-I70.0) and Emphysema (ICD10-J43.9).       REVIEW OF SYSTEMS:   Constitutional: Denies fevers, chills or abnormal weight loss Eyes: Denies blurriness of vision Ears, nose, mouth, throat, and face: Denies mucositis or sore throat Respiratory: Denies cough, dyspnea or wheezes Cardiovascular: Denies palpitation, chest discomfort or lower extremity swelling Gastrointestinal:  Denies nausea, heartburn or change in bowel habits Skin: Denies abnormal skin rashes Lymphatics: Denies new  lymphadenopathy or easy bruising Neurological:Denies numbness, tingling or new weaknesses Behavioral/Psych: Mood is stable, no new changes  All other systems were reviewed with the patient and are negative.  I have reviewed  the past medical history, past surgical history, social history and family history with the patient and they are unchanged from previous note.  ALLERGIES:  has No Known Allergies.  MEDICATIONS:  Current Outpatient Medications  Medication Sig Dispense Refill   acyclovir (ZOVIRAX) 400 MG tablet TAKE 1 TABLET TWICE A DAY 180 tablet 3   apixaban (ELIQUIS) 5 MG TABS tablet TAKE 1 TABLET TWICE A DAY 180 tablet 1   brimonidine (ALPHAGAN) 0.2 % ophthalmic solution Place 1 drop into both eyes 2 (two) times daily.      cholecalciferol (VITAMIN D3) 25 MCG (1000 UNIT) tablet Take 1,000 Units by mouth daily.     clobetasol (OLUX) 0.05 % topical foam Apply 1 application topically 2 (two) times daily.     flecainide (TAMBOCOR) 50 MG tablet Take 1 tablet (50 mg total) by mouth 2 (two) times daily. 180 tablet 3   idelalisib (ZYDELIG) 150 MG tablet TAKE 1 TABLET (150 MG TOTAL) BY MOUTH 2 (TWO) TIMES DAILY. 60 tablet 11   LUMIGAN 0.01 % SOLN Place 1 drop into both eyes daily.     meclizine (ANTIVERT) 25 MG tablet Take 1 tablet (25 mg total) by mouth 3 (three) times daily as needed for dizziness. 30 tablet 1   metoprolol succinate (TOPROL XL) 25 MG 24 hr tablet Take 0.5 tablets (12.5 mg total) by mouth daily. 45 tablet 2   Multiple Vitamin (MULTIVITAMIN WITH MINERALS) TABS tablet Take 1 tablet by mouth daily.      NON FORMULARY Take 4 oz by mouth daily. Beet Root     omeprazole (PRILOSEC) 20 MG capsule TAKE 1 CAPSULE DAILY (Patient taking differently: Every other day) 90 capsule 1   Probiotic Product (PROBIOTIC DAILY PO) Take 1 capsule by mouth daily.      prochlorperazine (COMPAZINE) 10 MG tablet Take 1 tablet (10 mg total) by mouth every 6 (six) hours as needed for nausea or vomiting. 30 tablet 2   rosuvastatin (CRESTOR) 20 MG tablet Take 1 tablet (20 mg total) by mouth daily. 90 tablet 3   terazosin (HYTRIN) 2 MG capsule TAKE 1 CAPSULE AT BEDTIME 90 capsule 3   triamcinolone (KENALOG) 0.025 % cream Apply 1  application topically 2 (two) times daily.     triamcinolone (KENALOG) 0.1 % Apply 1 application topically 2 (two) times daily.     No current facility-administered medications for this visit.    PHYSICAL EXAMINATION: ECOG PERFORMANCE STATUS: 1 - Symptomatic but completely ambulatory  Vitals:   08/01/20 1154  BP: 102/63  Pulse: 69  Resp: 18  Temp: 97.9 F (36.6 C)  SpO2: 96%   Filed Weights   08/01/20 1154  Weight: 191 lb 3.2 oz (86.7 kg)    GENERAL:alert, no distress and comfortable SKIN: skin color, texture, turgor are normal, no rashes or significant lesions EYES: normal, Conjunctiva are pink and non-injected, sclera clear OROPHARYNX:no exudate, no erythema and lips, buccal mucosa, and tongue normal  NECK: supple, thyroid normal size, non-tender, without nodularity LYMPH:  no palpable lymphadenopathy in the cervical, axillary or inguinal LUNGS: clear to auscultation and percussion with normal breathing effort HEART: regular rate & rhythm and no murmurs and no lower extremity edema ABDOMEN:abdomen soft, non-tender and normal bowel sounds Musculoskeletal:no cyanosis of digits and no clubbing  NEURO: alert &  oriented x 3 with fluent speech, no focal motor/sensory deficits  LABORATORY DATA:  I have reviewed the data as listed    Component Value Date/Time   NA 134 (L) 07/30/2020 1246   NA 140 09/27/2019 0801   NA 139 04/14/2016 0943   K 4.1 07/30/2020 1246   K 4.6 04/14/2016 0943   CL 100 07/30/2020 1246   CL 104 03/24/2012 1024   CO2 25 07/30/2020 1246   CO2 25 04/14/2016 0943   GLUCOSE 90 07/30/2020 1246   GLUCOSE 111 04/14/2016 0943   GLUCOSE 80 03/24/2012 1024   BUN 16 07/30/2020 1246   BUN 15 09/27/2019 0801   BUN 8.6 04/14/2016 0943   CREATININE 1.19 07/30/2020 1246   CREATININE 1.1 04/14/2016 0943   CALCIUM 8.8 (L) 07/30/2020 1246   CALCIUM 9.4 04/14/2016 0943   PROT 6.7 07/30/2020 1246   PROT 6.4 12/27/2019 1143   PROT 6.5 04/14/2016 0943   ALBUMIN  4.1 07/30/2020 1246   ALBUMIN 4.4 09/27/2019 0801   ALBUMIN 3.8 04/14/2016 0943   AST 27 07/30/2020 1246   AST 28 04/14/2016 0943   ALT 24 07/30/2020 1246   ALT 41 04/14/2016 0943   ALKPHOS 54 07/30/2020 1246   ALKPHOS 71 04/14/2016 0943   BILITOT 0.8 07/30/2020 1246   BILITOT 0.5 09/27/2019 0801   BILITOT 0.61 04/14/2016 0943   GFRNONAA >60 07/30/2020 1246   GFRAA 69 09/27/2019 0801    No results found for: SPEP, UPEP  Lab Results  Component Value Date   WBC 8.6 07/30/2020   NEUTROABS 4.2 07/30/2020   HGB 15.7 07/30/2020   HCT 46.0 07/30/2020   MCV 92.7 07/30/2020   PLT 187 07/30/2020      Chemistry      Component Value Date/Time   NA 134 (L) 07/30/2020 1246   NA 140 09/27/2019 0801   NA 139 04/14/2016 0943   K 4.1 07/30/2020 1246   K 4.6 04/14/2016 0943   CL 100 07/30/2020 1246   CL 104 03/24/2012 1024   CO2 25 07/30/2020 1246   CO2 25 04/14/2016 0943   BUN 16 07/30/2020 1246   BUN 15 09/27/2019 0801   BUN 8.6 04/14/2016 0943   CREATININE 1.19 07/30/2020 1246   CREATININE 1.1 04/14/2016 0943      Component Value Date/Time   CALCIUM 8.8 (L) 07/30/2020 1246   CALCIUM 9.4 04/14/2016 0943   ALKPHOS 54 07/30/2020 1246   ALKPHOS 71 04/14/2016 0943   AST 27 07/30/2020 1246   AST 28 04/14/2016 0943   ALT 24 07/30/2020 1246   ALT 41 04/14/2016 0943   BILITOT 0.8 07/30/2020 1246   BILITOT 0.5 09/27/2019 0801   BILITOT 0.61 04/14/2016 0943       RADIOGRAPHIC STUDIES:I have reviewed CT imaging with the patient I have personally reviewed the radiological images as listed and agreed with the findings in the report. MR ANGIO HEAD WO CONTRAST  Result Date: 07/24/2020 CLINICAL DATA:  Neuro deficit, acute, stroke suspected. Additional provided: Visual disturbances while driving. EXAM: MRI HEAD WITHOUT CONTRAST MRA HEAD WITHOUT CONTRAST TECHNIQUE: Multiplanar, multi-echo pulse sequences of the brain and surrounding structures were acquired without intravenous  contrast. Angiographic images of the Circle of Willis were acquired using MRA technique without intravenous contrast. COMPARISON:  Head CT 10/25/2004. FINDINGS: MRI HEAD FINDINGS Brain: Mild generalized cerebral and cerebellar atrophy. Mild multifocal T2/FLAIR hyperintensity within the cerebral white matter, nonspecific but compatible with chronic small vessel ischemic disease. There is no acute  intracranial hemorrhage. No demarcated cortical infarct. No extra-axial fluid collection. No evidence of an intracranial mass. No midline shift. Vascular: Expected proximal arterial flow voids. Skull and upper cervical spine: No focal marrow lesion. Sinuses/Orbits: Visualized orbits show no acute finding. Right lens replacement. Frothy secretions and moderate mucosal thickening within the left sphenoid sinus. Other: Right mastoid effusion. MRA HEAD FINDINGS Anterior circulation: The intracranial internal carotid arteries are patent. The M1 middle cerebral arteries are patent. Atherosclerotic irregularity of the M2 and more distal middle cerebral artery vessels bilaterally. No M2 proximal branch occlusion is identified. The anterior cerebral arteries are patent. No intracranial aneurysm is identified. Posterior circulation: The intracranial vertebral arteries are patent. The basilar artery is patent. The posterior cerebral arteries are patent bilaterally with atherosclerotic irregularity but without high-grade proximal stenosis. Anatomic variants: Posterior communicating arteries are hypoplastic or absent bilaterally. IMPRESSION: MRI brain: 1. No evidence of acute intracranial abnormality. 2. Mild generalized parenchymal atrophy. 3. Left sphenoid sinusitis. 4. Right mastoid effusion. MRA head: 1. No intracranial large vessel occlusion or proximal high-grade arterial stenosis. 2. Atherosclerotic irregularity of the M2 and more distal middle cerebral artery vessels, and bilateral posterior cerebral arteries, bilaterally.  Electronically Signed   By: Kellie Simmering DO   On: 07/24/2020 18:27   MR BRAIN WO CONTRAST  Result Date: 07/24/2020 CLINICAL DATA:  Neuro deficit, acute, stroke suspected. Additional provided: Visual disturbances while driving. EXAM: MRI HEAD WITHOUT CONTRAST MRA HEAD WITHOUT CONTRAST TECHNIQUE: Multiplanar, multi-echo pulse sequences of the brain and surrounding structures were acquired without intravenous contrast. Angiographic images of the Circle of Willis were acquired using MRA technique without intravenous contrast. COMPARISON:  Head CT 10/25/2004. FINDINGS: MRI HEAD FINDINGS Brain: Mild generalized cerebral and cerebellar atrophy. Mild multifocal T2/FLAIR hyperintensity within the cerebral white matter, nonspecific but compatible with chronic small vessel ischemic disease. There is no acute intracranial hemorrhage. No demarcated cortical infarct. No extra-axial fluid collection. No evidence of an intracranial mass. No midline shift. Vascular: Expected proximal arterial flow voids. Skull and upper cervical spine: No focal marrow lesion. Sinuses/Orbits: Visualized orbits show no acute finding. Right lens replacement. Frothy secretions and moderate mucosal thickening within the left sphenoid sinus. Other: Right mastoid effusion. MRA HEAD FINDINGS Anterior circulation: The intracranial internal carotid arteries are patent. The M1 middle cerebral arteries are patent. Atherosclerotic irregularity of the M2 and more distal middle cerebral artery vessels bilaterally. No M2 proximal branch occlusion is identified. The anterior cerebral arteries are patent. No intracranial aneurysm is identified. Posterior circulation: The intracranial vertebral arteries are patent. The basilar artery is patent. The posterior cerebral arteries are patent bilaterally with atherosclerotic irregularity but without high-grade proximal stenosis. Anatomic variants: Posterior communicating arteries are hypoplastic or absent bilaterally.  IMPRESSION: MRI brain: 1. No evidence of acute intracranial abnormality. 2. Mild generalized parenchymal atrophy. 3. Left sphenoid sinusitis. 4. Right mastoid effusion. MRA head: 1. No intracranial large vessel occlusion or proximal high-grade arterial stenosis. 2. Atherosclerotic irregularity of the M2 and more distal middle cerebral artery vessels, and bilateral posterior cerebral arteries, bilaterally. Electronically Signed   By: Kellie Simmering DO   On: 07/24/2020 18:27   CT CHEST ABDOMEN PELVIS W CONTRAST  Result Date: 07/31/2020 CLINICAL DATA:  History of low-grade B-cell lymphoma with lung metastases. EXAM: CT CHEST, ABDOMEN, AND PELVIS WITH CONTRAST TECHNIQUE: Multidetector CT imaging of the chest, abdomen and pelvis was performed following the standard protocol during bolus administration of intravenous contrast. CONTRAST:  164m OMNIPAQUE IOHEXOL 300 MG/ML  SOLN COMPARISON:  Multiple priors including most recent CT November 16, 2019 FINDINGS: CT CHEST FINDINGS Cardiovascular: Aortic atherosclerosis without aneurysmal dilation. No central pulmonary embolus. Coronary artery calcifications. Normal size heart. No significant pericardial effusion/thickening. Mediastinum/Nodes: No discrete thyroid nodularity. No pathologically enlarged mediastinal, hilar or axillary lymph nodes. No supraclavicular adenopathy. Large hiatal hernia. Lungs/Pleura: Linear suture line in the right lung along the major fissure without suspicious nodularity. No suspicious pulmonary nodules or masses. Paraseptal emphysema. Scattered subsegmental atelectasis. Musculoskeletal: Nonenhancing fluid delete that density nonenhancing nodularity extending from neural foramina at multiple levels for instance the right T10-11, bilateral neural foramina at T11-12 and left greater than right T12-L1, which appears similar over multiple priors and were non hypermetabolic on prior PET-CT and consistent with perineural root sleeve cyst. No aggressive  lytic or blastic lesion of bone. CT ABDOMEN PELVIS FINDINGS Hepatobiliary: Benign cysts in the left hepatic lobe. Gallbladder surgically absent. Similar prominence of the biliary tree, consistent with reservoir effect post cholecystectomy. Pancreas: Within normal limits. Spleen: Normal size spleen.  No focal splenic lesion. Adrenals/Urinary Tract: Adrenal glands are unremarkable. Hypodense left renal cysts measuring up to 3.9 cm. Kidneys are otherwise normal, without renal calculi, solid enhancing lesion, or hydronephrosis. Bladder is unremarkable. Stomach/Bowel: Large hiatal hernia otherwise stomach is unremarkable. Radiopaque enteric contrast traverses the distal small bowel. No pathologic dilation of small bowel. No suspicious small bowel wall thickening. The appendix is not definitely visualized however there is no pericecal inflammation. Delete that left-sided colonic diverticulosis without findings of acute diverticulitis. Vascular/Lymphatic: Aortic atherosclerosis without aneurysmal dilation. Similar size of prominent/mildly enlarged retroperitoneal abdominopelvic lymph nodes. No new areas of suspicious adenopathy. Indexed lymph nodes are as follows: Aortocaval lymph node measures 9 mm on image 89/2, previously 10 mm. Left periaortic lymph node measures 9 mm on image 84/2, previously 10 mm. Right common iliac lymph node measures 13 mm on image 95/2 previously 12 mm when remeasured for consistency. Left common iliac lymph node measures 12 mm on image 95/2, previously 13 mm. Reproductive: Prostate is unremarkable. Other: No abdominopelvic ascites. Musculoskeletal: No aggressive lytic or blastic lesion of bone. L5-S1 discogenic disease. IMPRESSION: 1. Stable examination. No significant interval change in the prominent/mildly enlarged lymph nodes below the diaphragm. No thoracic adenopathy. No splenomegaly. 2. Postsurgical change of right lung wedge resection without evidence of local recurrence. 3. Large hiatal  hernia. 4. Colonic diverticulosis without findings of acute diverticulitis. 5. Aortic Atherosclerosis (ICD10-I70.0) and Emphysema (ICD10-J43.9). Electronically Signed   By: Dahlia Bailiff MD   On: 07/31/2020 12:04

## 2020-08-02 NOTE — Assessment & Plan Note (Signed)
His recent visual impairment is intermittent, overall improved He was told it is a mild stroke He will continue risk factors modifications

## 2020-08-05 ENCOUNTER — Other Ambulatory Visit (HOSPITAL_COMMUNITY): Payer: Self-pay

## 2020-08-05 MED FILL — Idelalisib Tab 150 MG: ORAL | 30 days supply | Qty: 60 | Fill #4 | Status: AC

## 2020-08-11 ENCOUNTER — Other Ambulatory Visit (HOSPITAL_COMMUNITY): Payer: Self-pay

## 2020-08-12 ENCOUNTER — Encounter: Payer: Self-pay | Admitting: *Deleted

## 2020-08-12 ENCOUNTER — Telehealth: Payer: Self-pay | Admitting: Cardiology

## 2020-08-12 ENCOUNTER — Telehealth: Payer: Self-pay | Admitting: *Deleted

## 2020-08-12 ENCOUNTER — Encounter: Payer: Self-pay | Admitting: Cardiology

## 2020-08-12 ENCOUNTER — Ambulatory Visit (INDEPENDENT_AMBULATORY_CARE_PROVIDER_SITE_OTHER): Payer: Medicare Other | Admitting: Cardiology

## 2020-08-12 VITALS — BP 112/58 | HR 93 | Ht 72.0 in | Wt 192.6 lb

## 2020-08-12 DIAGNOSIS — I2510 Atherosclerotic heart disease of native coronary artery without angina pectoris: Secondary | ICD-10-CM | POA: Diagnosis not present

## 2020-08-12 DIAGNOSIS — G459 Transient cerebral ischemic attack, unspecified: Secondary | ICD-10-CM | POA: Diagnosis not present

## 2020-08-12 DIAGNOSIS — I480 Paroxysmal atrial fibrillation: Secondary | ICD-10-CM | POA: Diagnosis not present

## 2020-08-12 DIAGNOSIS — H532 Diplopia: Secondary | ICD-10-CM | POA: Diagnosis not present

## 2020-08-12 NOTE — Telephone Encounter (Signed)
99/50 just mildly decreased bp. The only meds we could adjust are his metoprolol or his prostate medication, neither would want to change unless we absolustely had too. I would be ok with systolic blood pressure high 90s unless having significant dizziness or starting having systolic blood pressures in the low 90s.    Zandra Abts MD

## 2020-08-12 NOTE — Patient Instructions (Signed)
Medication Instructions:  Continue all current medications.  Labwork: none  Testing/Procedures:  Your physician has requested that you have an echocardiogram. Echocardiography is a painless test that uses sound waves to create images of your heart. It provides your doctor with information about the size and shape of your heart and how well your heart's chambers and valves are working. This procedure takes approximately one hour. There are no restrictions for this procedure.  Office will contact with results via phone or letter.    Follow-Up: 6 months   Any Other Special Instructions Will Be Listed Below (If Applicable).  If you need a refill on your cardiac medications before your next appointment, please call your pharmacy.  

## 2020-08-12 NOTE — Progress Notes (Signed)
Clinical Summary Anthony Kelly is a 75 y.o.male seen today for follow up of the following medical problems.    1. PAF - followed by EP, started on flecanide       - some low heart rates and bp's at times, toprol was lowered - these problems have resolved since lowering toprol - compliant with eliquis, no bleeding   - no recent palpitations, though notices home rates can be in the 90s at times, usual for him he reports is in the 60s      2. CAD - Coronary CT angiography demonstrated nonobstructive coronary artery disease in the LAD, left circumflex, and RCA. - 05/2019 cath: prox LAD 25%, ramus 50%, RCA 25% - 03/2019 TC 137 TG 75 HDL 61 LDL 61     - no recent chest pains    3. NSVT -no symptoms     4. Lymphoma - followed by oncology  5. Double vision - transient episode, seen in ER 07/24/20 - MRI/MRA no acute process, no significant cerebrovascular disease - followed by optho, pcp    Past Medical History:  Diagnosis Date   Acute pericarditis    a. 04/2013 -adm with CP, elevated CRP. H/o coronary artery calcification on prior CT but nuc was negative, EF 71%.   Arthritis    Atrial fibrillation (Anthony Kelly)    a. Isolated episode in the setting of acute pericarditis 04/2013. Was not placed on anticoag.   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      cholecalciferol (VITAMIN D3) 25 MCG (1000 UNIT) tablet Take 1,000 Units by mouth daily.     clobetasol (OLUX) 0.05 % topical foam Apply 1 application topically 2 (two) times daily.     flecainide (TAMBOCOR) 50 MG tablet Take 1 tablet (50 mg total) by mouth 2 (two) times daily. 180 tablet 3   idelalisib (ZYDELIG) 150 MG tablet TAKE 1 TABLET (150 MG TOTAL) BY MOUTH 2 (TWO) TIMES DAILY. 60 tablet 11   LUMIGAN 0.01 % SOLN Place 1 drop into both eyes daily.     meclizine (ANTIVERT) 25 MG tablet Take 1 tablet (25 mg total) by mouth 3 (three) times daily as needed for dizziness. 30 tablet 1   metoprolol succinate (TOPROL XL) 25 MG 24 hr tablet Take 0.5 tablets (12.5 mg total) by mouth daily. 45 tablet 2   Multiple Vitamin (MULTIVITAMIN WITH MINERALS) TABS tablet Take 1 tablet by mouth daily.  NON FORMULARY Take 4 oz by mouth daily. Beet Root     omeprazole (PRILOSEC) 20 MG capsule TAKE 1 CAPSULE DAILY (Patient taking differently: Every other day) 90 capsule 1   Probiotic Product (PROBIOTIC DAILY PO) Take 1 capsule by mouth daily.      prochlorperazine (COMPAZINE) 10 MG tablet Take 1 tablet (10 mg total) by mouth every 6 (six) hours as needed for nausea or vomiting. 30 tablet 2   rosuvastatin (CRESTOR) 20 MG tablet Take 1 tablet (20 mg total) by mouth daily. 90 tablet 3   terazosin (HYTRIN) 2 MG capsule TAKE 1 CAPSULE AT BEDTIME 90 capsule 3   triamcinolone (KENALOG) 0.025 % cream Apply 1 application topically 2 (two) times daily.     triamcinolone (KENALOG) 0.1 % Apply 1 application topically 2 (two) times daily.     No current facility-administered medications for  this visit.     Past Surgical History:  Procedure Laterality Date   CHOLECYSTECTOMY     EXPLORATORY LAPAROTOMY     EYE SURGERY Right 2016   cataract   LEFT HEART CATH AND CORONARY ANGIOGRAPHY N/A 06/05/2019   Procedure: LEFT HEART CATH AND CORONARY ANGIOGRAPHY;  Surgeon: Jettie Booze, MD;  Location: [facility];  Service: Cardiovascular;  Laterality: N/A;   LUNG LOBECTOMY     right side   LYMPH NODE BIOPSY     in groin with removal   MENISCUS REPAIR     right knee   PROSTATE SURGERY     REFRACTIVE SURGERY Right    piece of metal removed   SUBMANDIBULAR GLAND EXCISION  04/2011   SUBMANDIBULAR GLAND EXCISION  04/30/2011   Procedure: EXCISION SUBMANDIBULAR GLAND;  Surgeon: Jerrell Belfast, MD;  Location: [facility];  Service: ENT;  Laterality: Left;  WITH DIAGNOSTIC BIOPSY   TONSILLECTOMY     as a child   VEIN LIGATION AND STRIPPING     right leg     No Known Allergies    Family History  Problem Relation Age of Onset   Heart disease Father    Heart attack Father        x 3   Cancer Sister        breast ca   Cancer Brother        prostate ca   Heart attack Sister    Cancer Sister        breast   Heart disease Brother    Alzheimer's disease Sister    Diabetes Sister    Heart disease Brother    Diabetes Brother    Heart disease Brother    Heart disease Brother    Heart disease Brother    Heart disease Brother    Heart disease Brother    Alzheimer's disease Brother    Diabetes Son    Anesthesia problems Neg Hx      Social History Anthony Kelly reports that he quit smoking about 30 years ago. His smoking use included cigarettes. He started smoking about 57 years ago. He has a 37.50 pack-year smoking history. He has never used smokeless tobacco. Anthony Kelly reports no history of alcohol use.   Review of Systems CONSTITUTIONAL: No weight loss, fever, chills, weakness or fatigue.  HEENT: Eyes: No visual loss, blurred vision, double vision or yellow  sclerae.No hearing loss, sneezing, congestion, runny nose or sore throat.  SKIN: No rash or itching.  CARDIOVASCULAR: per hpi RESPIRATORY: No shortness of breath, cough or sputum.  GASTROINTESTINAL: No anorexia,  nausea, vomiting or diarrhea. No abdominal pain or blood.  GENITOURINARY: No burning on urination, no polyuria NEUROLOGICAL: No headache, dizziness, syncope, paralysis, ataxia, numbness or tingling in the extremities. No change in bowel or bladder control.  MUSCULOSKELETAL: No muscle, back pain, joint pain or stiffness.  LYMPHATICS: No enlarged nodes. No history of splenectomy.  PSYCHIATRIC: No history of depression or anxiety.  ENDOCRINOLOGIC: No reports of sweating, cold or heat intolerance. No polyuria or polydipsia.  Marland Kitchen   Physical Examination Today's Vitals   08/12/20 0817  BP: (!) 112/58  Pulse: 93  SpO2: 97%  Weight: 192 lb 9.6 oz (87.4 kg)  Height: 6' (1.829 m)   Body mass index is 26.12 kg/m.  Gen: resting comfortably, no acute distress HEENT: no scleral icterus, pupils equal round and reactive, no palptable cervical adenopathy,  CV: RRR, no m/r/g no jvd Resp: Clear to auscultation bilaterally GI: abdomen is soft, non-tender, non-distended, normal bowel sounds, no hepatosplenomegaly MSK: extremities are warm, no edema.  Skin: warm, no rash Neuro:  no focal deficits Psych: appropriate affect   Diagnostic Studies Coronary CT angiography 04/27/2019:   1. Left Main:  No significant stenosis.   2. LAD: No significant stenosis.  FFR 0.9 proximal, 0.84 distal 3. LCX: No significant stenosis. FFR 0.9 in the mid vessel, 0.84 distally 4. RCA: No significant stenosis. FFR 0.9 proximally, 0.87 mid, 0.80 distally   IMPRESSION: 1.  CT FFR analysis didn't show any significant stenosis.     Echocardiogram 05/22/2019:   1. Left ventricular ejection fraction, by estimation, is 60 to 65%. The  left ventricle has normal function. The left ventricle has no regional   wall motion abnormalities. There is mild left ventricular hypertrophy.  Left ventricular diastolic parameters  are indeterminate.   2. Right ventricular systolic function is normal. The right ventricular  size is mildly enlarged. Mildly increased right ventricular wall  thickness. Tricuspid regurgitation signal is inadequate for assessing PA  pressure.   3. Left atrial size was mildly dilated.   4. The mitral valve is grossly normal. Trivial mitral valve  regurgitation.   5. The aortic valve is tricuspid. Aortic valve regurgitation is not  visualized.   6. The inferior vena cava is normal in size with greater than 50%  respiratory variability, suggesting right atrial pressure of 3 mmHg.    Assessment and Plan  1. PAF - in the absence of clinically significant CAD have continued flecanide - no recent symptoms, continue current meds - EKG today shows NSR at 87 bpm   2. CAD - mild disease by cath - no recent symptoms,continue current meds   3. Double vision - per pcp and optho. Awaiting optho records, unclear if this was a TIA. - from a cardiac standpoint his MRA showed no significant carotid disease, he has known afib and is already on eliquis. We will repeat an echo for any changes. Defer to pcp if neuro referal would be indicated. He is already on a statin      Arnoldo Lenis, M.D.

## 2020-08-12 NOTE — Telephone Encounter (Signed)
Patient informed and verbalized understanding of plan. 

## 2020-08-12 NOTE — Telephone Encounter (Signed)
New message    Patient BP is dropping since stopping the Cancer medication for his procedure is there something they can do to bring it back up  99/50

## 2020-08-12 NOTE — Telephone Encounter (Signed)
Patient wife called - Eunice Blase being held at Dr. Calton Dach direction for 5 days pre-op and 2 days post op for surgery for skin cancer on face this Thursday 08/14/20. Wife states bp now 2/50 and hr 90. She asked if vital signs changed related to holding Zydelig? She wants to know if he should restart medication, stating surgeon's directions were for him to continue all medications. His wife states she also contacted cardiologist today to inform them b/p low.   Contacted patient's wife and advised that Dr. Alvy Bimler was out of office for day and would receive this message tomorrow. At this time, patient should continue holding Zydelig.   Call information/question routed to Dr. Alvy Bimler.

## 2020-08-13 NOTE — Telephone Encounter (Signed)
Called and given below message to wife. She verbalized understanding.

## 2020-08-13 NOTE — Telephone Encounter (Signed)
Hi Brenda,  Please call Beverely Low is not responsible for changes in his blood pressure Restart Zydelig 1-2 days after biopsy

## 2020-08-14 DIAGNOSIS — D0439 Carcinoma in situ of skin of other parts of face: Secondary | ICD-10-CM | POA: Diagnosis not present

## 2020-08-14 DIAGNOSIS — D044 Carcinoma in situ of skin of scalp and neck: Secondary | ICD-10-CM | POA: Diagnosis not present

## 2020-08-14 DIAGNOSIS — Z85828 Personal history of other malignant neoplasm of skin: Secondary | ICD-10-CM | POA: Diagnosis not present

## 2020-08-15 ENCOUNTER — Other Ambulatory Visit: Payer: Self-pay | Admitting: Pharmacist

## 2020-08-15 ENCOUNTER — Encounter: Payer: Self-pay | Admitting: Hematology and Oncology

## 2020-08-15 ENCOUNTER — Telehealth: Payer: Self-pay

## 2020-08-15 NOTE — Telephone Encounter (Signed)
Called and told wife okay to take medication recommended by dermatologist. She verbalized understanding.

## 2020-08-15 NOTE — Telephone Encounter (Signed)
Wife called and left a message. Okay to take Nicotinamide 500 mg BID for prevention per the recommendation from dermatologist? Procedure went well yesterday.

## 2020-08-19 ENCOUNTER — Ambulatory Visit: Payer: Medicare Other | Admitting: Hematology and Oncology

## 2020-08-19 ENCOUNTER — Other Ambulatory Visit: Payer: Medicare Other

## 2020-08-21 DIAGNOSIS — H2512 Age-related nuclear cataract, left eye: Secondary | ICD-10-CM | POA: Diagnosis not present

## 2020-08-21 DIAGNOSIS — H04123 Dry eye syndrome of bilateral lacrimal glands: Secondary | ICD-10-CM | POA: Diagnosis not present

## 2020-08-21 DIAGNOSIS — Z9889 Other specified postprocedural states: Secondary | ICD-10-CM | POA: Diagnosis not present

## 2020-08-21 DIAGNOSIS — H524 Presbyopia: Secondary | ICD-10-CM | POA: Diagnosis not present

## 2020-08-21 DIAGNOSIS — H401131 Primary open-angle glaucoma, bilateral, mild stage: Secondary | ICD-10-CM | POA: Diagnosis not present

## 2020-08-21 DIAGNOSIS — H353131 Nonexudative age-related macular degeneration, bilateral, early dry stage: Secondary | ICD-10-CM | POA: Diagnosis not present

## 2020-08-21 DIAGNOSIS — L905 Scar conditions and fibrosis of skin: Secondary | ICD-10-CM | POA: Diagnosis not present

## 2020-09-01 ENCOUNTER — Other Ambulatory Visit (HOSPITAL_COMMUNITY): Payer: Self-pay

## 2020-09-02 ENCOUNTER — Ambulatory Visit (HOSPITAL_COMMUNITY)
Admission: RE | Admit: 2020-09-02 | Discharge: 2020-09-02 | Disposition: A | Discharge status: Home / Self Care | Payer: Medicare Other | Source: Ambulatory Visit | Attending: Cardiology | Admitting: Cardiology

## 2020-09-02 ENCOUNTER — Other Ambulatory Visit: Payer: Self-pay

## 2020-09-02 DIAGNOSIS — G459 Transient cerebral ischemic attack, unspecified: Secondary | ICD-10-CM | POA: Diagnosis not present

## 2020-09-02 LAB — ECHOCARDIOGRAM COMPLETE
AR max vel: 3.28 cm2
AV Area VTI: 3.75 cm2
AV Area mean vel: 3.2 cm2
AV Mean grad: 2.9 mmHg
AV Peak grad: 5.5 mmHg
Ao pk vel: 1.17 m/s
Area-P 1/2: 2.94 cm2
S' Lateral: 2 cm

## 2020-09-02 NOTE — Progress Notes (Signed)
*  PRELIMINARY RESULTS* Echocardiogram 2D Echocardiogram has been performed.  Anthony Kelly 09/02/2020, 9:53 AM

## 2020-09-04 ENCOUNTER — Ambulatory Visit: Payer: Medicare Other | Admitting: Family Medicine

## 2020-09-04 ENCOUNTER — Other Ambulatory Visit (HOSPITAL_COMMUNITY): Payer: Self-pay

## 2020-09-04 MED FILL — Idelalisib Tab 150 MG: ORAL | 30 days supply | Qty: 60 | Fill #5 | Status: AC

## 2020-09-08 DIAGNOSIS — L905 Scar conditions and fibrosis of skin: Secondary | ICD-10-CM | POA: Diagnosis not present

## 2020-09-10 ENCOUNTER — Other Ambulatory Visit (HOSPITAL_COMMUNITY): Payer: Self-pay

## 2020-09-11 HISTORY — PX: MOHS SURGERY: SUR867

## 2020-09-12 ENCOUNTER — Other Ambulatory Visit: Payer: Self-pay

## 2020-09-12 ENCOUNTER — Ambulatory Visit (INDEPENDENT_AMBULATORY_CARE_PROVIDER_SITE_OTHER): Payer: Medicare Other | Admitting: Internal Medicine

## 2020-09-12 ENCOUNTER — Encounter: Payer: Self-pay | Admitting: Internal Medicine

## 2020-09-12 VITALS — BP 112/71 | HR 60 | Ht 72.0 in | Wt 191.4 lb

## 2020-09-12 DIAGNOSIS — I480 Paroxysmal atrial fibrillation: Secondary | ICD-10-CM

## 2020-09-12 DIAGNOSIS — E785 Hyperlipidemia, unspecified: Secondary | ICD-10-CM | POA: Diagnosis not present

## 2020-09-12 DIAGNOSIS — I2510 Atherosclerotic heart disease of native coronary artery without angina pectoris: Secondary | ICD-10-CM

## 2020-09-12 NOTE — Patient Instructions (Signed)
Medication Instructions:  Continue all current medications.  Labwork: none  Testing/Procedures: none  Follow-Up: 1 year   Any Other Special Instructions Will Be Listed Below (If Applicable).  If you need a refill on your cardiac medications before your next appointment, please call your pharmacy.  

## 2020-09-12 NOTE — Progress Notes (Signed)
PCP: Dettinger, [name], MD Primary Cardiologist: Dr Morene Crocker Primary EP: Dr Rayann Heman  Anthony Kelly is a 75 y.o. male who presents today for routine electrophysiology followup.  Since last being seen in our clinic, the patient reports doing very well.  Today, he denies symptoms of palpitations, chest pain, shortness of breath,  lower extremity edema, dizziness, presyncope, or syncope.  The patient is otherwise without complaint today.   Past Medical History:  Diagnosis Date   Acute pericarditis    a. 04/2013 -adm with CP, elevated CRP. H/o coronary artery calcification on prior CT but nuc was negative, EF 71%.   Arthritis    Atrial fibrillation ([place])    a. Isolated episode in the setting of acute pericarditis 04/2013. Was not placed on anticoag.   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   lt submandibular ln   Pain in joint, pelvic region and thigh 08/01/2013   PSVT (paroxysmal supraventricular tachycardia) ([place])    Past Surgical History:  Procedure Laterality Date   CHOLECYSTECTOMY     EXPLORATORY LAPAROTOMY     EYE SURGERY Right 2016   cataract   LEFT HEART CATH AND CORONARY ANGIOGRAPHY N/A 06/05/2019   Procedure: LEFT HEART CATH AND CORONARY ANGIOGRAPHY;  Surgeon: Jettie Booze, MD;  Location: [facility];  Service: Cardiovascular;  Laterality: N/A;   LUNG LOBECTOMY     right side   LYMPH NODE BIOPSY     in groin with removal   MENISCUS REPAIR     right knee   PROSTATE SURGERY      REFRACTIVE SURGERY Right    piece of metal removed   SUBMANDIBULAR GLAND EXCISION  04/2011   SUBMANDIBULAR GLAND EXCISION  04/30/2011   Procedure: EXCISION SUBMANDIBULAR GLAND;  Surgeon: Jerrell Belfast, MD;  Location: [facility];  Service: ENT;  Laterality: Left;  WITH DIAGNOSTIC BIOPSY   TONSILLECTOMY     as a child   VEIN LIGATION AND STRIPPING     right leg    ROS- all systems are reviewed and negatives except as per HPI above  Current Outpatient Medications  Medication Sig Dispense Refill   acyclovir (ZOVIRAX) 400 MG tablet TAKE 1 TABLET TWICE A DAY 180 tablet 3   apixaban (ELIQUIS) 5 MG TABS tablet TAKE 1 TABLET TWICE A DAY 180 tablet 1   brimonidine (ALPHAGAN) 0.2 % ophthalmic solution Place 1 drop into both eyes 2 (two) times daily.      cholecalciferol (VITAMIN D3) 25 MCG (1000 UNIT) tablet Take 1,000 Units by mouth daily.     flecainide (TAMBOCOR) 50 MG tablet Take 1 tablet (50 mg total) by mouth 2 (two) times daily. 180 tablet 3   idelalisib (ZYDELIG) 150 MG tablet TAKE 1 TABLET (150 MG TOTAL) BY MOUTH 2 (TWO) TIMES DAILY. 60 tablet 11   Lifitegrast (XIIDRA) 5 % SOLN      LUMIGAN 0.01 % SOLN Place 1 drop into both eyes daily.     meclizine (  ANTIVERT) 25 MG tablet Take 1 tablet (25 mg total) by mouth 3 (three) times daily as needed for dizziness. 30 tablet 1   metoprolol succinate (TOPROL XL) 25 MG 24 hr tablet Take 0.5 tablets (12.5 mg total) by mouth daily. 45 tablet 2   Multiple Vitamin (MULTIVITAMIN WITH MINERALS) TABS tablet Take 1 tablet by mouth daily.      Niacinamide-Zn-Cu-Methfo-Se-Cr (NICOTINAMIDE PO) Take 500 mg by mouth in the morning and at bedtime.     NON FORMULARY Take 4 oz by mouth daily. Beet Root     omeprazole (PRILOSEC) 20 MG capsule TAKE 1 CAPSULE DAILY 90 capsule 1   Probiotic Product (PROBIOTIC DAILY PO) Take 1 capsule by mouth daily.      prochlorperazine (COMPAZINE) 10 MG tablet Take 1 tablet (10 mg total) by mouth every 6 (six) hours as needed for nausea  or vomiting. 30 tablet 2   rosuvastatin (CRESTOR) 20 MG tablet Take 1 tablet (20 mg total) by mouth daily. 90 tablet 3   terazosin (HYTRIN) 2 MG capsule TAKE 1 CAPSULE AT BEDTIME 90 capsule 3   triamcinolone (KENALOG) 0.025 % cream Apply 1 application topically 2 (two) times daily.     triamcinolone (KENALOG) 0.1 % Apply 1 application topically 2 (two) times daily.     clobetasol (OLUX) 0.05 % topical foam Apply 1 application topically 2 (two) times daily. (Patient not taking: Reported on 09/12/2020)     No current facility-administered medications for this visit.    Physical Exam: Vitals:   09/12/20 0847  BP: 112/71  Pulse: 60  SpO2: 96%  Weight: 191 lb 6.4 oz (86.8 kg)  Height: 6' (1.829 m)    GEN- The patient is well appearing, alert and oriented x 3 today.   Head- normocephalic, atraumatic Eyes-  Sclera clear, conjunctiva pink Ears- hearing intact Oropharynx- clear Lungs- Clear to ausculation bilaterally, normal work of breathing Heart- Regular rate and rhythm, no murmurs, rubs or gallops, PMI not laterally displaced GI- soft, NT, ND, + BS Extremities- no clubbing, cyanosis, or edema  Wt Readings from Last 3 Encounters:  09/12/20 191 lb 6.4 oz (86.8 kg)  08/12/20 192 lb 9.6 oz (87.4 kg)  08/01/20 191 lb 3.2 oz (86.7 kg)    EKG tracing ordered today is personally reviewed and shows sinus with normal intervals  Assessment and Plan:  Atrial fibrillation, PACs, PVCs Well controlled with flecainide Chads2vasc score is 2.  Continue eliquis  2. HL Continue statin  3. Lymphoma Follows with oncology  Return in a year  Thompson Grayer MD, [facility] 09/12/2020 9:22 AM

## 2020-09-17 ENCOUNTER — Encounter: Payer: Self-pay | Admitting: Hematology and Oncology

## 2020-09-17 ENCOUNTER — Other Ambulatory Visit (HOSPITAL_COMMUNITY): Payer: Self-pay

## 2020-09-22 ENCOUNTER — Encounter: Payer: Self-pay | Admitting: Hematology and Oncology

## 2020-09-25 ENCOUNTER — Encounter: Payer: Self-pay | Admitting: Family Medicine

## 2020-09-29 ENCOUNTER — Other Ambulatory Visit (HOSPITAL_COMMUNITY): Payer: Self-pay

## 2020-09-30 ENCOUNTER — Other Ambulatory Visit: Payer: Self-pay | Admitting: Family Medicine

## 2020-09-30 ENCOUNTER — Other Ambulatory Visit: Payer: Medicare Other

## 2020-09-30 DIAGNOSIS — Z0000 Encounter for general adult medical examination without abnormal findings: Secondary | ICD-10-CM | POA: Diagnosis not present

## 2020-09-30 DIAGNOSIS — D696 Thrombocytopenia, unspecified: Secondary | ICD-10-CM | POA: Diagnosis not present

## 2020-09-30 DIAGNOSIS — E782 Mixed hyperlipidemia: Secondary | ICD-10-CM | POA: Diagnosis not present

## 2020-09-30 DIAGNOSIS — I480 Paroxysmal atrial fibrillation: Secondary | ICD-10-CM | POA: Diagnosis not present

## 2020-09-30 DIAGNOSIS — R7303 Prediabetes: Secondary | ICD-10-CM

## 2020-09-30 DIAGNOSIS — I952 Hypotension due to drugs: Secondary | ICD-10-CM | POA: Diagnosis not present

## 2020-10-01 LAB — CBC WITH DIFFERENTIAL/PLATELET
Basophils Absolute: 0.1 10*3/uL (ref 0.0–0.2)
Basos: 1 %
EOS (ABSOLUTE): 0.8 10*3/uL — ABNORMAL HIGH (ref 0.0–0.4)
Eos: 12 %
Hematocrit: 44 % (ref 37.5–51.0)
Hemoglobin: 15.4 g/dL (ref 13.0–17.7)
Immature Grans (Abs): 0 10*3/uL (ref 0.0–0.1)
Immature Granulocytes: 0 %
Lymphocytes Absolute: 2 10*3/uL (ref 0.7–3.1)
Lymphs: 31 %
MCH: 30.8 pg (ref 26.6–33.0)
MCHC: 35 g/dL (ref 31.5–35.7)
MCV: 88 fL (ref 79–97)
Monocytes Absolute: 0.7 10*3/uL (ref 0.1–0.9)
Monocytes: 11 %
Neutrophils Absolute: 2.8 10*3/uL (ref 1.4–7.0)
Neutrophils: 45 %
Platelets: 165 10*3/uL (ref 150–450)
RBC: 5 x10E6/uL (ref 4.14–5.80)
RDW: 13.2 % (ref 11.6–15.4)
WBC: 6.3 10*3/uL (ref 3.4–10.8)

## 2020-10-01 LAB — CMP14+EGFR
ALT: 19 IU/L (ref 0–44)
AST: 22 IU/L (ref 0–40)
Albumin/Globulin Ratio: 2.6 — ABNORMAL HIGH (ref 1.2–2.2)
Albumin: 4.5 g/dL (ref 3.7–4.7)
Alkaline Phosphatase: 55 IU/L (ref 44–121)
BUN/Creatinine Ratio: 11 (ref 10–24)
BUN: 12 mg/dL (ref 8–27)
Bilirubin Total: 0.6 mg/dL (ref 0.0–1.2)
CO2: 23 mmol/L (ref 20–29)
Calcium: 9.3 mg/dL (ref 8.6–10.2)
Chloride: 104 mmol/L (ref 96–106)
Creatinine, Ser: 1.05 mg/dL (ref 0.76–1.27)
Globulin, Total: 1.7 g/dL (ref 1.5–4.5)
Glucose: 119 mg/dL — ABNORMAL HIGH (ref 65–99)
Potassium: 4.4 mmol/L (ref 3.5–5.2)
Sodium: 142 mmol/L (ref 134–144)
Total Protein: 6.2 g/dL (ref 6.0–8.5)
eGFR: 74 mL/min/{1.73_m2} (ref >59)

## 2020-10-01 LAB — THYROID PANEL WITH TSH
Free Thyroxine Index: 2 (ref 1.2–4.9)
T3 Uptake Ratio: 28 % (ref 24–39)
T4, Total: 7.2 ug/dL (ref 4.5–12.0)
TSH: 1.72 u[IU]/mL (ref 0.450–4.500)

## 2020-10-01 LAB — LIPID PANEL
Chol/HDL Ratio: 2.2 ratio (ref 0.0–5.0)
Cholesterol, Total: 109 mg/dL (ref 100–199)
HDL: 49 mg/dL (ref >39)
LDL Chol Calc (NIH): 38 mg/dL (ref 0–99)
Triglycerides: 128 mg/dL (ref 0–149)
VLDL Cholesterol Cal: 22 mg/dL (ref 5–40)

## 2020-10-02 ENCOUNTER — Ambulatory Visit (INDEPENDENT_AMBULATORY_CARE_PROVIDER_SITE_OTHER): Payer: Medicare Other | Admitting: Family Medicine

## 2020-10-02 ENCOUNTER — Other Ambulatory Visit: Payer: Self-pay

## 2020-10-02 ENCOUNTER — Encounter: Payer: Self-pay | Admitting: Family Medicine

## 2020-10-02 VITALS — BP 90/77 | HR 68 | Ht 72.0 in | Wt 193.0 lb

## 2020-10-02 DIAGNOSIS — R7303 Prediabetes: Secondary | ICD-10-CM

## 2020-10-02 DIAGNOSIS — K219 Gastro-esophageal reflux disease without esophagitis: Secondary | ICD-10-CM

## 2020-10-02 DIAGNOSIS — N400 Enlarged prostate without lower urinary tract symptoms: Secondary | ICD-10-CM

## 2020-10-02 DIAGNOSIS — I480 Paroxysmal atrial fibrillation: Secondary | ICD-10-CM

## 2020-10-02 DIAGNOSIS — I2510 Atherosclerotic heart disease of native coronary artery without angina pectoris: Secondary | ICD-10-CM | POA: Diagnosis not present

## 2020-10-02 DIAGNOSIS — E782 Mixed hyperlipidemia: Secondary | ICD-10-CM

## 2020-10-02 MED ORDER — MECLIZINE HCL 25 MG PO TABS: 25.0000 mg | ORAL_TABLET | Freq: Three times a day (TID) | ORAL | 1 refills | Status: DC | PRN

## 2020-10-02 MED ORDER — OMEPRAZOLE 20 MG PO CPDR: 20.0000 mg | DELAYED_RELEASE_CAPSULE | Freq: Every day | ORAL | 3 refills | Status: DC

## 2020-10-02 NOTE — Progress Notes (Signed)
BP 90/77   Pulse 68   Ht 6' (1.829 m)   Wt 193 lb (87.5 kg)   SpO2 94%   BMI 26.18 kg/m    Subjective:   Patient ID: Anthony Kelly, male    DOB: 1945/11/12, 75 y.o.   MRN: [id number]  HPI: Anthony Kelly is a 75 y.o. male presenting on 10/02/2020 for Medical Management of Chronic Issues   HPI Hyperlipidemia and A. fib Patient is coming in for recheck of his hyperlipidemia. The patient is currently taking Crestor and Eliquis, sees cardiology. They deny any issues with myalgias or history of liver damage from it. They deny any focal numbness or weakness or chest pain.  Denies any bruising or bleeding  GERD Patient is currently on omeprazole.  She denies any major symptoms or abdominal pain or belching or burping. She denies any blood in her stool or lightheadedness or dizziness.    BPH Patient is coming in for recheck on BPH Symptoms: None currently Medication: Terazosin   Prediabetes  patient comes in today for recheck of his diabetes. Patient has been currently taking no medication currently, has been diet controlled for diabetes. Patient is not currently on an ACE inhibitor/ARB. Patient has seen an ophthalmologist this year. Patient denies any issues with their feet. The symptom started onset as an adult hyperlipidemia and A. fib and GERD ARE RELATED TO DM   Relevant past medical, surgical, family and social history reviewed and updated as indicated. Interim medical history since our last visit reviewed. Allergies and medications reviewed and updated.  Review of Systems  Constitutional:  Negative for chills and fever.  Eyes:  Negative for visual disturbance.  Respiratory:  Negative for shortness of breath and wheezing.   Cardiovascular:  Negative for chest pain and leg swelling.  Musculoskeletal:  Negative for back pain and gait problem.  Skin:  Negative for rash.  Neurological:  Negative for dizziness, weakness and light-headedness.  All other systems reviewed and are  negative.  Per HPI unless specifically indicated above   Allergies as of 10/02/2020   No Known Allergies      Medication List        Accurate as of October 02, 2020 11:01 AM. If you have any questions, ask your nurse or doctor.          STOP taking these medications    NON FORMULARY Stopped by: [name] Sameer Teeple, MD       TAKE these medications    acyclovir 400 MG tablet Commonly known as: ZOVIRAX TAKE 1 TABLET TWICE A DAY   brimonidine 0.2 % ophthalmic solution Commonly known as: ALPHAGAN Place 1 drop into both eyes 2 (two) times daily.   cholecalciferol 25 MCG (1000 UNIT) tablet Commonly known as: VITAMIN D3 Take 1,000 Units by mouth daily.   clobetasol 0.05 % topical foam Commonly known as: OLUX Apply 1 application topically 2 (two) times daily.   Eliquis 5 MG Tabs tablet Generic drug: apixaban TAKE 1 TABLET TWICE A DAY   flecainide 50 MG tablet Commonly known as: TAMBOCOR Take 1 tablet (50 mg total) by mouth 2 (two) times daily.   Lumigan 0.01 % Soln Generic drug: bimatoprost Place 1 drop into both eyes daily.   meclizine 25 MG tablet Commonly known as: ANTIVERT Take 1 tablet (25 mg total) by mouth 3 (three) times daily as needed for dizziness.   metoprolol succinate 25 MG 24 hr tablet Commonly known as: Toprol XL Take 0.5 tablets (12.5 mg total)  by mouth daily.   multivitamin with minerals Tabs tablet Take 1 tablet by mouth daily.   NICOTINAMIDE PO Take 500 mg by mouth in the morning and at bedtime.   omeprazole 20 MG capsule Commonly known as: PRILOSEC Take 1 capsule (20 mg total) by mouth daily.   PROBIOTIC DAILY PO Take 1 capsule by mouth daily.   prochlorperazine 10 MG tablet Commonly known as: COMPAZINE Take 1 tablet (10 mg total) by mouth every 6 (six) hours as needed for nausea or vomiting.   rosuvastatin 20 MG tablet Commonly known as: CRESTOR Take 1 tablet (20 mg total) by mouth daily.   terazosin 2 MG  capsule Commonly known as: HYTRIN TAKE 1 CAPSULE AT BEDTIME   triamcinolone cream 0.1 % Commonly known as: KENALOG Apply 1 application topically 2 (two) times daily.   triamcinolone 0.025 % cream Commonly known as: KENALOG Apply 1 application topically 2 (two) times daily.   Xiidra 5 % Soln Generic drug: Lifitegrast   Zydelig 150 MG tablet Generic drug: idelalisib TAKE 1 TABLET (150 MG TOTAL) BY MOUTH 2 (TWO) TIMES DAILY.         Objective:   BP 90/77   Pulse 68   Ht 6' (1.829 m)   Wt 193 lb (87.5 kg)   SpO2 94%   BMI 26.18 kg/m   Wt Readings from Last 3 Encounters:  10/02/20 193 lb (87.5 kg)  09/12/20 191 lb 6.4 oz (86.8 kg)  08/12/20 192 lb 9.6 oz (87.4 kg)    Physical Exam Vitals and nursing note reviewed.  Constitutional:      General: He is not in acute distress.    Appearance: He is well-developed. He is not diaphoretic.  Eyes:     General: No scleral icterus.    Conjunctiva/sclera: Conjunctivae normal.  Neck:     Thyroid: No thyromegaly.  Cardiovascular:     Rate and Rhythm: Normal rate and regular rhythm.     Pulses: Normal pulses.     Heart sounds: Normal heart sounds. No murmur heard.   No friction rub. No gallop.  Pulmonary:     Effort: Pulmonary effort is normal. No respiratory distress.     Breath sounds: Normal breath sounds. No wheezing.  Musculoskeletal:        General: Normal range of motion.     Cervical back: Neck supple.  Lymphadenopathy:     Cervical: No cervical adenopathy.  Skin:    General: Skin is warm and dry.     Findings: No rash.  Neurological:     Mental Status: He is alert and oriented to person, place, and time.     Coordination: Coordination normal.  Psychiatric:        Behavior: Behavior normal.    Results for orders placed or performed in visit on 09/30/20  CBC with Differential/Platelet  Result Value Ref Range   WBC 6.3 3.4 - 10.8 x10E3/uL   RBC 5.00 4.14 - 5.80 x10E6/uL   Hemoglobin 15.4 13.0 - 17.7 g/dL    Hematocrit 44.0 37.5 - 51.0 %   MCV 88 79 - 97 fL   MCH 30.8 26.6 - 33.0 pg   MCHC 35.0 31.5 - 35.7 g/dL   RDW 13.2 11.6 - 15.4 %   Platelets 165 150 - 450 x10E3/uL   Neutrophils 45 Not Estab. %   Lymphs 31 Not Estab. %   Monocytes 11 Not Estab. %   Eos 12 Not Estab. %   Basos 1 Not Estab. %  Neutrophils Absolute 2.8 1.4 - 7.0 x10E3/uL   Lymphocytes Absolute 2.0 0.7 - 3.1 x10E3/uL   Monocytes Absolute 0.7 0.1 - 0.9 x10E3/uL   EOS (ABSOLUTE) 0.8 (H) 0.0 - 0.4 x10E3/uL   Basophils Absolute 0.1 0.0 - 0.2 x10E3/uL   Immature Granulocytes 0 Not Estab. %   Immature Grans (Abs) 0.0 0.0 - 0.1 x10E3/uL  CMP14+EGFR  Result Value Ref Range   Glucose 119 (H) 65 - 99 mg/dL   BUN 12 8 - 27 mg/dL   Creatinine, Ser 1.05 0.76 - 1.27 mg/dL   eGFR 74 >59 mL/min/1.73   BUN/Creatinine Ratio 11 10 - 24   Sodium 142 134 - 144 mmol/L   Potassium 4.4 3.5 - 5.2 mmol/L   Chloride 104 96 - 106 mmol/L   CO2 23 20 - 29 mmol/L   Calcium 9.3 8.6 - 10.2 mg/dL   Total Protein 6.2 6.0 - 8.5 g/dL   Albumin 4.5 3.7 - 4.7 g/dL   Globulin, Total 1.7 1.5 - 4.5 g/dL   Albumin/Globulin Ratio 2.6 (H) 1.2 - 2.2   Bilirubin Total 0.6 0.0 - 1.2 mg/dL   Alkaline Phosphatase 55 44 - 121 IU/L   AST 22 0 - 40 IU/L   ALT 19 0 - 44 IU/L  Lipid panel  Result Value Ref Range   Cholesterol, Total 109 100 - 199 mg/dL   Triglycerides 128 0 - 149 mg/dL   HDL 49 >39 mg/dL   VLDL Cholesterol Cal 22 5 - 40 mg/dL   LDL Chol Calc (NIH) 38 0 - 99 mg/dL   Chol/HDL Ratio 2.2 0.0 - 5.0 ratio  Thyroid Panel With TSH  Result Value Ref Range   TSH 1.720 0.450 - 4.500 uIU/mL   T4, Total 7.2 4.5 - 12.0 ug/dL   T3 Uptake Ratio 28 24 - 39 %   Free Thyroxine Index 2.0 1.2 - 4.9    Assessment & Plan:   Problem List Items Addressed This Visit       Cardiovascular and Mediastinum   PAF (paroxysmal atrial fibrillation) (HCC) (Chronic)     Digestive   GERD (gastroesophageal reflux disease)   Relevant Medications   meclizine  (ANTIVERT) 25 MG tablet   omeprazole (PRILOSEC) 20 MG capsule     Genitourinary   Benign prostate hyperplasia     Other   Hyperlipidemia - Primary   Prediabetes    Blood sugar mildly elevated but the rest of his blood work looks good, continue current medicine no change.  Follow-up in 6 months Follow up plan: Return in about 6 months (around 04/01/2021), or if symptoms worsen or fail to improve, for Physical exam and recheck hyperlipidemia.  Counseling provided for all of the vaccine components No orders of the defined types were placed in this encounter.   Caryl Pina, MD [facility] 10/02/2020, 11:01 AM

## 2020-10-06 ENCOUNTER — Other Ambulatory Visit: Payer: Self-pay

## 2020-10-06 MED ORDER — IDELALISIB 150 MG PO TABS: 150.0000 mg | ORAL_TABLET | Freq: Two times a day (BID) | ORAL | 11 refills | Status: DC

## 2020-10-07 ENCOUNTER — Other Ambulatory Visit (HOSPITAL_COMMUNITY): Payer: Self-pay

## 2020-10-07 ENCOUNTER — Encounter: Payer: Self-pay | Admitting: Hematology and Oncology

## 2020-10-07 ENCOUNTER — Other Ambulatory Visit: Payer: Self-pay | Admitting: Hematology and Oncology

## 2020-10-07 MED ORDER — IDELALISIB 150 MG PO TABS
ORAL_TABLET | Freq: Two times a day (BID) | ORAL | 11 refills | Status: DC
  Filled 2020-10-15: qty 60, 30d supply, fill #0

## 2020-10-15 ENCOUNTER — Other Ambulatory Visit (HOSPITAL_COMMUNITY): Payer: Self-pay

## 2020-10-15 ENCOUNTER — Encounter: Payer: Self-pay | Admitting: Hematology and Oncology

## 2020-10-16 ENCOUNTER — Other Ambulatory Visit: Payer: Self-pay

## 2020-10-16 MED ORDER — IDELALISIB 150 MG PO TABS: 150.0000 mg | ORAL_TABLET | Freq: Two times a day (BID) | ORAL | 11 refills | Status: DC

## 2020-10-17 ENCOUNTER — Other Ambulatory Visit (HOSPITAL_COMMUNITY): Payer: Self-pay

## 2020-10-23 DIAGNOSIS — H401131 Primary open-angle glaucoma, bilateral, mild stage: Secondary | ICD-10-CM | POA: Diagnosis not present

## 2020-10-23 DIAGNOSIS — Z961 Presence of intraocular lens: Secondary | ICD-10-CM | POA: Diagnosis not present

## 2020-10-31 ENCOUNTER — Inpatient Hospital Stay: Payer: Medicare Other | Attending: Hematology and Oncology

## 2020-10-31 ENCOUNTER — Encounter: Payer: Self-pay | Admitting: Hematology and Oncology

## 2020-10-31 ENCOUNTER — Other Ambulatory Visit: Payer: Self-pay

## 2020-10-31 ENCOUNTER — Inpatient Hospital Stay (HOSPITAL_BASED_OUTPATIENT_CLINIC_OR_DEPARTMENT_OTHER): Payer: Medicare Other | Admitting: Hematology and Oncology

## 2020-10-31 DIAGNOSIS — C44319 Basal cell carcinoma of skin of other parts of face: Secondary | ICD-10-CM | POA: Diagnosis not present

## 2020-10-31 DIAGNOSIS — Z85828 Personal history of other malignant neoplasm of skin: Secondary | ICD-10-CM | POA: Diagnosis not present

## 2020-10-31 DIAGNOSIS — C8280 Other types of follicular lymphoma, unspecified site: Secondary | ICD-10-CM

## 2020-10-31 DIAGNOSIS — Z9221 Personal history of antineoplastic chemotherapy: Secondary | ICD-10-CM | POA: Diagnosis not present

## 2020-10-31 DIAGNOSIS — Z8572 Personal history of non-Hodgkin lymphomas: Secondary | ICD-10-CM | POA: Diagnosis not present

## 2020-10-31 DIAGNOSIS — D696 Thrombocytopenia, unspecified: Secondary | ICD-10-CM

## 2020-10-31 LAB — CMP (CANCER CENTER ONLY)
ALT: 20 U/L (ref 0–44)
AST: 23 U/L (ref 15–41)
Albumin: 4.4 g/dL (ref 3.5–5.0)
Alkaline Phosphatase: 51 U/L (ref 38–126)
Anion gap: 7 (ref 5–15)
BUN: 17 mg/dL (ref 8–23)
CO2: 25 mmol/L (ref 22–32)
Calcium: 9.2 mg/dL (ref 8.9–10.3)
Chloride: 105 mmol/L (ref 98–111)
Creatinine: 1 mg/dL (ref 0.61–1.24)
GFR, Estimated: >60 mL/min (ref >60)
Glucose, Bld: 129 mg/dL — ABNORMAL HIGH (ref 70–99)
Potassium: 4.5 mmol/L (ref 3.5–5.1)
Sodium: 137 mmol/L (ref 135–145)
Total Bilirubin: 0.6 mg/dL (ref 0.3–1.2)
Total Protein: 6.9 g/dL (ref 6.5–8.1)

## 2020-10-31 LAB — CBC WITH DIFFERENTIAL (CANCER CENTER ONLY)
Abs Immature Granulocytes: 0.01 10*3/uL (ref 0.00–0.07)
Basophils Absolute: 0.1 10*3/uL (ref 0.0–0.1)
Basophils Relative: 1 %
Eosinophils Absolute: 0.5 10*3/uL (ref 0.0–0.5)
Eosinophils Relative: 9 %
HCT: 44.2 % (ref 39.0–52.0)
Hemoglobin: 15.3 g/dL (ref 13.0–17.0)
Immature Granulocytes: 0 %
Lymphocytes Relative: 32 %
Lymphs Abs: 1.7 10*3/uL (ref 0.7–4.0)
MCH: 30.1 pg (ref 26.0–34.0)
MCHC: 34.6 g/dL (ref 30.0–36.0)
MCV: 86.8 fL (ref 80.0–100.0)
Monocytes Absolute: 0.8 10*3/uL (ref 0.1–1.0)
Monocytes Relative: 15 %
Neutro Abs: 2.2 10*3/uL (ref 1.7–7.7)
Neutrophils Relative %: 43 %
Platelet Count: 158 10*3/uL (ref 150–400)
RBC: 5.09 MIL/uL (ref 4.22–5.81)
RDW: 12.9 % (ref 11.5–15.5)
WBC Count: 5.2 10*3/uL (ref 4.0–10.5)
nRBC: 0 % (ref 0.0–0.2)

## 2020-10-31 NOTE — Assessment & Plan Note (Signed)
I have reviewed CT imaging and blood work with the patient and family There is no signs of active disease The plan will be to continue treatment indefinitely I will see him again in 3 months for further follow-up

## 2020-10-31 NOTE — Progress Notes (Signed)
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   All questions were answered. The patient knows to call the clinic with any problems, [name], [name], MD 10/31/2020 3:13 PM  INTERVAL HISTORY: Please see below for problem oriented charting. he returns for treatment follow-up on idelalisib for recurrent lymphoma He had successful Mohs procedure on the left cheek without recurrence of skin lesions He tolerated treatment very well No recent infection  REVIEW OF SYSTEMS:   Constitutional: Denies fevers, chills or abnormal weight loss Eyes: Denies blurriness of vision Ears, nose, mouth, throat, and face: Denies mucositis or sore throat Respiratory: Denies cough, dyspnea  or wheezes Cardiovascular: Denies palpitation, chest discomfort or lower extremity swelling Gastrointestinal:  Denies nausea, heartburn or change in bowel habits Skin: Denies abnormal skin rashes Lymphatics: Denies new lymphadenopathy or easy bruising Neurological:Denies numbness, tingling or new weaknesses Behavioral/Psych: Mood is stable, no new changes  All other systems were reviewed with the patient and are negative.  I have reviewed the past medical history, past surgical history, social history and family history with the patient and they are unchanged from previous note.  ALLERGIES:  has No Known Allergies.  MEDICATIONS:  Current Outpatient Medications  Medication Sig Dispense Refill   acyclovir (ZOVIRAX) 400 MG tablet TAKE 1 TABLET TWICE A DAY 180 tablet 3   apixaban (ELIQUIS) 5 MG TABS tablet TAKE 1 TABLET TWICE A DAY 180 tablet 1   brimonidine (ALPHAGAN) 0.2 % ophthalmic solution Place 1 drop into both eyes 2 (two) times daily.      cholecalciferol (VITAMIN D3) 25 MCG (1000 UNIT) tablet Take 1,000 Units by mouth daily.     clobetasol (OLUX) 0.05 % topical foam Apply 1 application topically 2 (two) times daily.     flecainide (TAMBOCOR) 50 MG tablet Take 1 tablet (50 mg total) by mouth 2 (two) times daily. 180 tablet 3   idelalisib (ZYDELIG) 150 MG tablet Take 1 tablet (150 mg total) by mouth 2 (two) times daily. 60 tablet 11   Lifitegrast (XIIDRA) 5 % SOLN      LUMIGAN 0.01 % SOLN Place 1 drop into both eyes daily.     meclizine (ANTIVERT) 25 MG tablet Take 1 tablet (25 mg total) by mouth 3 (three) times daily as needed for dizziness. 30 tablet 1   metoprolol succinate (TOPROL XL) 25 MG 24 hr tablet Take 0.5 tablets (  12.5 mg total) by mouth daily. 45 tablet 2   Multiple Vitamin (MULTIVITAMIN WITH MINERALS) TABS tablet Take 1 tablet by mouth daily.      Niacinamide-Zn-Cu-Methfo-Se-Cr (NICOTINAMIDE PO) Take 500 mg by mouth in the morning and at bedtime.     omeprazole (PRILOSEC)  20 MG capsule Take 1 capsule (20 mg total) by mouth daily. 90 capsule 3   Probiotic Product (PROBIOTIC DAILY PO) Take 1 capsule by mouth daily.      prochlorperazine (COMPAZINE) 10 MG tablet Take 1 tablet (10 mg total) by mouth every 6 (six) hours as needed for nausea or vomiting. 30 tablet 2   rosuvastatin (CRESTOR) 20 MG tablet Take 1 tablet (20 mg total) by mouth daily. 90 tablet 3   terazosin (HYTRIN) 2 MG capsule TAKE 1 CAPSULE AT BEDTIME 90 capsule 3   triamcinolone (KENALOG) 0.025 % cream Apply 1 application topically 2 (two) times daily.     triamcinolone (KENALOG) 0.1 % Apply 1 application topically 2 (two) times daily.     No current facility-administered medications for this visit.    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   12/12/2003 - 06/01/2004 Chemotherapy   He was treated with R. CHOP chemotherapy which show complete remission. The number of cycles of R. CHOP chemotherapy was unknown.   12/19/2006 Surgery   Lung resection show follicular lymphoma.   01/02/2007 - 09/01/2008 Chemotherapy   The patient was treated with single agent rituximab alone.   01/19/2007 Bone Marrow Biopsy   Bone marrow biopsy was negative.   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   05/08/2013 - 05/11/2013 Hospital Admission   The patient was admitted to the hospital for management of pericarditis. CT scan showed extensive lymphadenopathy.   06/07/2013 Imaging   PET/CT scan showed extensive lymphadenopathy   06/25/2013 Procedure   He has placement of Infuse-a-Port.   06/28/2013 Bone Marrow Biopsy   Bone marrow biopsy is  positive for lymphoma involvement.   07/04/2013 - 11/22/2013 Chemotherapy   He is treated with 6 cycles of bendamustine with rituximab.   09/24/2013 Imaging   Repeat PET scan show complete remission.   12/26/2013 Imaging   PEt scan showed complete remission   09/26/2014 Imaging   CT scan of the chest abdomen and pelvis show no evidence of disease   03/26/2015 Imaging   CT scan showed no evidence of lymphoma   04/14/2016 Imaging   CT: Borderline prominent right hilar and subcarinal lymph nodes, but not appreciably changed. 2. Low-grade but increased central mesenteric stranding with some small mesenteric lymph nodes. This could certainly be inflammatory, 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, much of which is likely a physiologic response to cholecystectomy. 7. Sigmoid colon diverticulosis. 8.  Prominent stool throughout the colon favors constipation. 9. Enlarged prostate gland, volume estimated at 75 cubic cm.   02/15/2017  Imaging   No evidence of recurrent lymphoma or other acute findings.  Stable moderate hiatal hernia.  Colonic diverticulosis, without radiographic evidence of diverticulitis.  Stable mildly enlarged prostate.  Mild emphysema.  Aortic and coronary artery atherosclerosis.   01/31/2019 Imaging   1. Interval development of abdominal and pelvic adenopathy compatible with recurrent lymphoma. 2. Emphysema and aortic atherosclerosis. 3. Multi vessel coronary artery calcifications. 4. Moderate to large hiatal hernia   02/09/2019 Procedure   Successful CT-guided core biopsy of the left retroperitoneal periaortic adenopathy   02/09/2019 Pathology Results    SURGICAL PATHOLOGY  CASE: [id number]  PATIENT: Anthony Kelly  Surgical Pathology Report   Clinical History: Lymphoma; [name], LEFT PARA AORTIC, [name], cleaved nuclei and scant cytoplasm.  By immunohistochemistry, [name], [name], [name], [name] is low (less than 10%).  Flow cytometry was attempted; however, there was insufficient material for analysis (see [id number]).  Overall, the features are consistent with relapse of the patient's previously diagnosed follicular lymphoma.  Based on the biopsy, this is favored to be a low-grade follicular lymphoma   01/15/4006 -  Chemotherapy   The patient had idelisib for chemotherapy treatment.     05/17/2019 Imaging   1. Interval generalized decrease in abdominopelvic lymphadenopathy identified on the previous study. No new or progressive lymphadenopathy on today's study. 2. Moderate hiatal hernia. 3.  Emphysema (ICD10-J43.9) and Aortic Atherosclerosis (ICD10-170.0)   11/16/2019 Imaging   IMPRESSION: Chest Impression:   1. No mediastinal lymphadenopathy. 2. Post RIGHT lung wedge resection without evidence local recurrence.   Abdomen / Pelvis Impression:   1. Stable numerous small periaortic retroperitoneal nodes and common iliac nodes. No progression of adenopathy. 2. No splenomegaly.  No skeletal metastasis.     07/31/2020 Imaging   1. Stable examination. No significant interval change in the  prominent/mildly enlarged lymph nodes below the diaphragm. No thoracic adenopathy. No splenomegaly. 2. Postsurgical change of right lung wedge resection without evidence of local recurrence. 3. Large hiatal hernia. 4. Colonic diverticulosis without findings of acute diverticulitis. 5. Aortic Atherosclerosis (ICD10-I70.0) and Emphysema (ICD10-J43.9).       PHYSICAL EXAMINATION: ECOG PERFORMANCE STATUS: 1 - Symptomatic but completely ambulatory  Vitals:   10/31/20 1019  BP: 107/65  Pulse: 79  Resp: 18  Temp: (!) 97.4 F (36.3 C)  SpO2: 95%   Filed Weights   10/31/20 1019  Weight: 195 lb 3.2 oz (88.5 kg)    GENERAL:alert, no distress and comfortable SKIN: skin color, texture, turgor are normal, no rashes or significant lesions.  Noted well-healed surgical scar EYES: normal, Conjunctiva are pink and non-injected, sclera clear OROPHARYNX:no exudate, no erythema and lips, buccal mucosa, and tongue normal  NECK: supple, thyroid normal size, non-tender, without nodularity LYMPH:  no palpable lymphadenopathy in the cervical, axillary or inguinal LUNGS: clear to auscultation and percussion with normal breathing effort HEART: regular rate & rhythm and no murmurs and no lower extremity edema ABDOMEN:abdomen soft, non-tender and normal bowel sounds  Musculoskeletal:no cyanosis of digits and no clubbing  NEURO: alert & oriented x 3 with fluent speech, no focal motor/sensory deficits  LABORATORY DATA:  I have reviewed the data as listed    Component Value Date/Time   NA 137 10/31/2020 1004   NA 142 09/30/2020 1014   NA 139 04/14/2016 0943   K 4.5 10/31/2020 1004   K 4.6 04/14/2016 0943   CL 105 10/31/2020 1004   CL 104 03/24/2012 1024   CO2 25 10/31/2020 1004   CO2 25 04/14/2016 0943   GLUCOSE 129 (H) 10/31/2020 1004   GLUCOSE 111 04/14/2016 0943   GLUCOSE 80 03/24/2012 1024   BUN 17 10/31/2020 1004   BUN 12 09/30/2020 1014   BUN 8.6 04/14/2016 0943   CREATININE 1.00  10/31/2020 1004   CREATININE 1.1 04/14/2016 0943   CALCIUM 9.2 10/31/2020 1004   CALCIUM 9.4 04/14/2016 0943   PROT 6.9 10/31/2020 1004   PROT 6.2 09/30/2020 1014   PROT 6.5 04/14/2016 0943   ALBUMIN 4.4 10/31/2020 1004   ALBUMIN 4.5 09/30/2020 1014   ALBUMIN 3.8 04/14/2016 0943   AST 23 10/31/2020 1004   AST 28 04/14/2016 0943   ALT 20 10/31/2020 1004   ALT 41 04/14/2016 0943   ALKPHOS 51 10/31/2020 1004   ALKPHOS 71 04/14/2016 0943   BILITOT 0.6 10/31/2020 1004   BILITOT 0.61 04/14/2016 0943   GFRNONAA >60 10/31/2020 1004   GFRAA 69 09/27/2019 0801    No results found for: SPEP, UPEP  Lab Results  Component Value Date   WBC 5.2 10/31/2020   NEUTROABS 2.2 10/31/2020   HGB 15.3 10/31/2020   HCT 44.2 10/31/2020   MCV 86.8 10/31/2020   PLT 158 10/31/2020      Chemistry      Component Value Date/Time   NA 137 10/31/2020 1004   NA 142 09/30/2020 1014   NA 139 04/14/2016 0943   K 4.5 10/31/2020 1004   K 4.6 04/14/2016 0943   CL 105 10/31/2020 1004   CL 104 03/24/2012 1024   CO2 25 10/31/2020 1004   CO2 25 04/14/2016 0943   BUN 17 10/31/2020 1004   BUN 12 09/30/2020 1014   BUN 8.6 04/14/2016 0943   CREATININE 1.00 10/31/2020 1004   CREATININE 1.1 04/14/2016 0943      Component Value Date/Time   CALCIUM 9.2 10/31/2020 1004   CALCIUM 9.4 04/14/2016 0943   ALKPHOS 51 10/31/2020 1004   ALKPHOS 71 04/14/2016 0943   AST 23 10/31/2020 1004   AST 28 04/14/2016 0943   ALT 20 10/31/2020 1004   ALT 41 04/14/2016 0943   BILITOT 0.6 10/31/2020 1004   BILITOT 0.61 04/14/2016 0943

## 2020-10-31 NOTE — Assessment & Plan Note (Signed)
He had successful Mohs procedure without significant postoperative complication We discussed importance of aggressive skin protection in the future and close follow-up with dermatologist

## 2020-11-08 ENCOUNTER — Other Ambulatory Visit (HOSPITAL_COMMUNITY): Payer: Self-pay

## 2020-11-14 ENCOUNTER — Encounter: Payer: Self-pay | Admitting: Hematology and Oncology

## 2020-11-16 DIAGNOSIS — Z20822 Contact with and (suspected) exposure to covid-19: Secondary | ICD-10-CM | POA: Diagnosis not present

## 2020-11-17 DIAGNOSIS — M546 Pain in thoracic spine: Secondary | ICD-10-CM | POA: Diagnosis not present

## 2020-11-17 DIAGNOSIS — Z6826 Body mass index (BMI) 26.0-26.9, adult: Secondary | ICD-10-CM | POA: Diagnosis not present

## 2020-11-18 ENCOUNTER — Other Ambulatory Visit (HOSPITAL_COMMUNITY): Payer: Self-pay | Admitting: Neurosurgery

## 2020-11-18 ENCOUNTER — Other Ambulatory Visit: Payer: Self-pay | Admitting: Neurosurgery

## 2020-11-18 DIAGNOSIS — M546 Pain in thoracic spine: Secondary | ICD-10-CM

## 2020-11-24 ENCOUNTER — Other Ambulatory Visit: Payer: Self-pay | Admitting: Hematology and Oncology

## 2020-11-25 ENCOUNTER — Encounter: Payer: Self-pay | Admitting: Hematology and Oncology

## 2020-11-25 ENCOUNTER — Telehealth: Payer: Self-pay | Admitting: *Deleted

## 2020-11-25 NOTE — Telephone Encounter (Signed)
Received fax from [facility] requesting clarification for compazine prescribed 11/24/20 by Dr. Alvy Bimler. Med is contraindicated with condition pharmacy noted on patient chart.   Called patient and wife - patient occ wakes w/nausea and takes compazine for relief. Does not take daily. Tried Zofran in past and he had headaches when taking it.    Information given to Dr. Alvy Bimler. Dr. Alvy Bimler reviewed information and asked that patient/wife contact [facility] to discuss options and resolution.  Contacted wife - she plans to contact [facility] to discuss compazine and any other options they can suggest. Per wife she has contacted them before for guidance.  Ms. Gamel will contact Dr. Calton Dach office once she has talked with them. Information routed to Dr. Alvy Bimler

## 2020-11-26 ENCOUNTER — Encounter: Payer: Self-pay | Admitting: Family Medicine

## 2020-11-26 ENCOUNTER — Telehealth: Payer: Self-pay | Admitting: Cardiology

## 2020-11-26 ENCOUNTER — Encounter: Payer: Self-pay | Admitting: Hematology and Oncology

## 2020-11-26 NOTE — Telephone Encounter (Signed)
Will fwd to pharmD for advice.

## 2020-11-26 NOTE — Telephone Encounter (Signed)
[name] (wife) notified & verbalized understanding.

## 2020-11-26 NOTE — Telephone Encounter (Signed)
Pt c/o medication issue:  1. Name of Medication: prochlorperazine (COMPAZINE) 10 MG tablet  2. How are you currently taking this medication (dosage and times per day)? AS NEEDED FOR NAUSEA  3. Are you having a reaction (difficulty breathing--STAT)? NO  4. What is your medication issue? THIS MED HAS BEEN FLAGGED BY [facility]. PT WANTS TO KNOW IF IT'S OKAY FOR HIM TO TAKE THIS MED OCCASIONALLY?

## 2020-11-26 NOTE — Telephone Encounter (Signed)
Patient currently on flecainide for A Fib.  Concurrent usage of compazine and flecainide is generally not recommended due to increased risk  of cardiotoxicity (QT prolongation, torsades de pointes, cardiac arrest)

## 2020-11-27 ENCOUNTER — Other Ambulatory Visit: Payer: Self-pay

## 2020-11-27 ENCOUNTER — Encounter: Payer: Self-pay | Admitting: Family Medicine

## 2020-11-27 ENCOUNTER — Ambulatory Visit (INDEPENDENT_AMBULATORY_CARE_PROVIDER_SITE_OTHER): Payer: Medicare Other | Admitting: Family Medicine

## 2020-11-27 VITALS — BP 108/69 | HR 79

## 2020-11-27 DIAGNOSIS — M5417 Radiculopathy, lumbosacral region: Secondary | ICD-10-CM | POA: Diagnosis not present

## 2020-11-27 DIAGNOSIS — I2510 Atherosclerotic heart disease of native coronary artery without angina pectoris: Secondary | ICD-10-CM

## 2020-11-27 MED ORDER — PREDNISONE 20 MG PO TABS: ORAL_TABLET | ORAL | 0 refills | Status: DC

## 2020-11-27 NOTE — Progress Notes (Signed)
BP 108/69   Pulse 79   SpO2 97%    Subjective:   Patient ID: Anthony Kelly, male    DOB: Feb 26, 1945, 75 y.o.   MRN: [id number]  HPI: Anthony Kelly is a 75 y.o. male presenting on 11/27/2020 for Knee Pain (Right. Radiates to right hip and buttocks)   HPI Right knee/leg pain Patient is coming in today for right knee/leg pain.  He says the pain seemed to start in his knee but then quickly went up his leg and around to his back on that side.  He describes it as a burning and shooting pain that moves around from his lower back to his anterior thigh.  He says it started about a week ago and he says it actually gets better when he is up and moving around on his feet worse at night.  Patient says this has been similar to what he had before and he actually is scheduled for an MRI tomorrow with Dr. Arnoldo Morale for his back.  Relevant past medical, surgical, family and social history reviewed and updated as indicated. Interim medical history since our last visit reviewed. Allergies and medications reviewed and updated.  Review of Systems  Constitutional:  Negative for chills and fever.  Respiratory:  Negative for shortness of breath and wheezing.   Cardiovascular:  Negative for chest pain and leg swelling.  Musculoskeletal:  Positive for arthralgias, back pain and myalgias. Negative for gait problem.  Skin:  Negative for rash.  All other systems reviewed and are negative.  Per HPI unless specifically indicated above   Allergies as of 11/27/2020   No Known Allergies      Medication List        Accurate as of November 27, 2020  9:54 AM. If you have any questions, ask your nurse or doctor.          STOP taking these medications    clobetasol 0.05 % topical foam Commonly known as: OLUX Stopped by: Worthy Rancher, MD   NICOTINAMIDE PO Stopped by: [name] Hiliana Eilts, MD   prochlorperazine 10 MG tablet Commonly known as: COMPAZINE Stopped by: [name] Abrianna Sidman, MD        TAKE these medications    acyclovir 400 MG tablet Commonly known as: ZOVIRAX TAKE 1 TABLET TWICE A DAY   brimonidine 0.2 % ophthalmic solution Commonly known as: ALPHAGAN Place 1 drop into both eyes 2 (two) times daily.   cholecalciferol 25 MCG (1000 UNIT) tablet Commonly known as: VITAMIN D3 Take 1,000 Units by mouth daily.   Eliquis 5 MG Tabs tablet Generic drug: apixaban TAKE 1 TABLET TWICE A DAY   flecainide 50 MG tablet Commonly known as: TAMBOCOR Take 1 tablet (50 mg total) by mouth 2 (two) times daily.   idelalisib 150 MG tablet Commonly known as: ZYDELIG Take 1 tablet (150 mg total) by mouth 2 (two) times daily.   Lumigan 0.01 % Soln Generic drug: bimatoprost Place 1 drop into both eyes daily.   meclizine 25 MG tablet Commonly known as: ANTIVERT Take 1 tablet (25 mg total) by mouth 3 (three) times daily as needed for dizziness.   metoprolol succinate 25 MG 24 hr tablet Commonly known as: Toprol XL Take 0.5 tablets (12.5 mg total) by mouth daily.   multivitamin with minerals Tabs tablet Take 1 tablet by mouth daily.   omeprazole 20 MG capsule Commonly known as: PRILOSEC Take 1 capsule (20 mg total) by mouth daily.   predniSONE 20 MG tablet  Commonly known as: DELTASONE 2 po at same time daily for 5 days Started by: [name] Satonya Lux, MD   PROBIOTIC DAILY PO Take 1 capsule by mouth daily.   rosuvastatin 20 MG tablet Commonly known as: CRESTOR Take 1 tablet (20 mg total) by mouth daily.   terazosin 2 MG capsule Commonly known as: HYTRIN TAKE 1 CAPSULE AT BEDTIME   triamcinolone cream 0.1 % Commonly known as: KENALOG Apply 1 application topically 2 (two) times daily.   triamcinolone 0.025 % cream Commonly known as: KENALOG Apply 1 application topically 2 (two) times daily.   vitamin E 1000 UNIT capsule Take 1,000 Units by mouth daily.   Xiidra 5 % Soln Generic drug: Lifitegrast         Objective:   BP 108/69   Pulse 79   SpO2  97%   Wt Readings from Last 3 Encounters:  10/31/20 195 lb 3.2 oz (88.5 kg)  10/02/20 193 lb (87.5 kg)  09/12/20 191 lb 6.4 oz (86.8 kg)    Physical Exam Vitals and nursing note reviewed.  Constitutional:      General: He is not in acute distress.    Appearance: He is well-developed. He is not diaphoretic.  Eyes:     General: No scleral icterus.    Conjunctiva/sclera: Conjunctivae normal.  Neck:     Thyroid: No thyromegaly.  Musculoskeletal:        General: No swelling. Normal range of motion.     Lumbar back: Tenderness present. No swelling or deformity. Negative right straight leg raise test and negative left straight leg raise test.       Back:     Right knee: No swelling, lacerations, bony tenderness or crepitus. Normal range of motion. No tenderness. Normal alignment.  Skin:    General: Skin is warm and dry.     Findings: No rash.  Neurological:     Mental Status: He is alert and oriented to person, place, and time.     Coordination: Coordination normal.  Psychiatric:        Behavior: Behavior normal.      Assessment & Plan:   Problem List Items Addressed This Visit   None Visit Diagnoses     Lumbosacral radiculopathy    -  Primary   Relevant Medications   predniSONE (DELTASONE) 20 MG tablet       Recommended for patient to not take the prednisone prior to his MRI but this should help calm it down, seems more like a sciatica versus radiculopathy Follow up plan: Return if symptoms worsen or fail to improve.  Counseling provided for all of the vaccine components No orders of the defined types were placed in this encounter.   Caryl Pina, MD [facility] 11/27/2020, 9:54 AM

## 2020-11-28 ENCOUNTER — Ambulatory Visit (HOSPITAL_COMMUNITY)
Admission: RE | Admit: 2020-11-28 | Discharge: 2020-11-28 | Disposition: A | Discharge status: Home / Self Care | Payer: Medicare Other | Source: Ambulatory Visit | Attending: Neurosurgery | Admitting: Neurosurgery

## 2020-11-28 DIAGNOSIS — M546 Pain in thoracic spine: Secondary | ICD-10-CM | POA: Diagnosis not present

## 2020-11-28 DIAGNOSIS — K449 Diaphragmatic hernia without obstruction or gangrene: Secondary | ICD-10-CM | POA: Diagnosis not present

## 2020-11-28 DIAGNOSIS — M40204 Unspecified kyphosis, thoracic region: Secondary | ICD-10-CM | POA: Diagnosis not present

## 2020-11-28 DIAGNOSIS — M47814 Spondylosis without myelopathy or radiculopathy, thoracic region: Secondary | ICD-10-CM | POA: Diagnosis not present

## 2020-11-28 DIAGNOSIS — M5124 Other intervertebral disc displacement, thoracic region: Secondary | ICD-10-CM | POA: Diagnosis not present

## 2020-11-28 IMAGING — MR MR THORACIC SPINE WO/W CM
6 of 9 series · 27 of 48 positions shown · IV contrast (gadavist)
Comparison: Thoracic radiographs [DATE] (without report).
CT chest [DATE].

CLINICAL DATA: Thoracic spine pain [8E] ([8E]-CM)

EXAM:
MRI THORACIC WITHOUT AND WITH CONTRAST
TECHNIQUE: Multiplanar and multiecho pulse sequences of the thoracic spine were
obtained without and with intravenous contrast.
CONTRAST:  8mL GADAVIST GADOBUTROL 1 MMOL/ML IV SOLN

[Series 18: T1 · sagittal · 6.0mm · 1.56mm/px · 1 of 9 slices shown (1 of 3)]
[im 1/9]
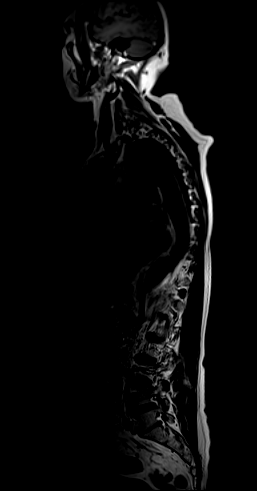

[Series 19: T2 · sagittal · 3.0mm · 0.84mm/px · 3 of 19 slices shown (1 of 2)]
[im 1/19]
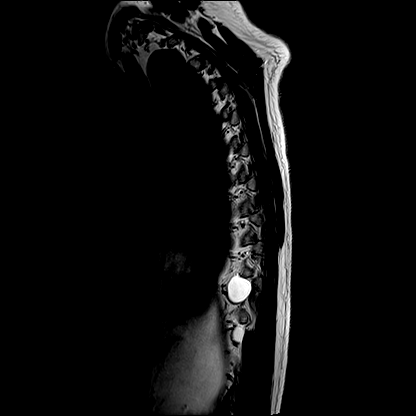
[im 10/19]
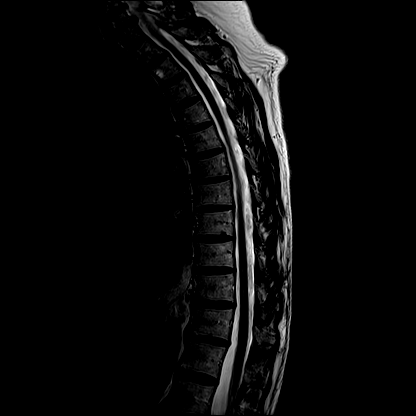
[im 19/19]
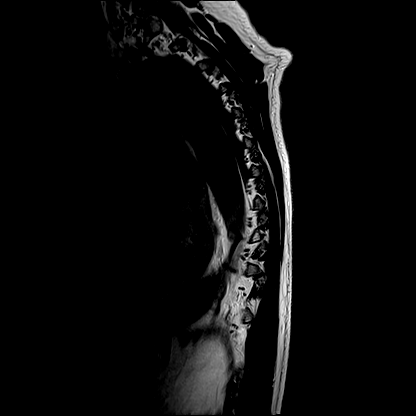

[Series 20: T1 · sagittal · 3.0mm · 0.81mm/px · 4 of 19 slices shown (2 of 3)]
[im 1/19]
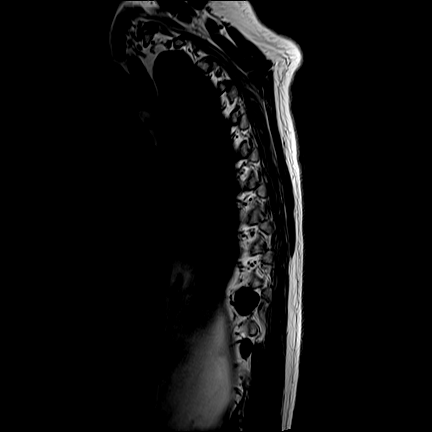
[im 7/19]
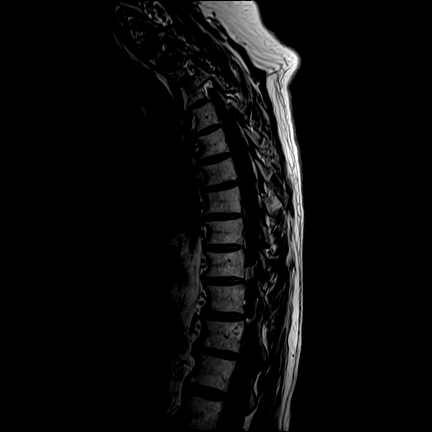
[im 13/19]
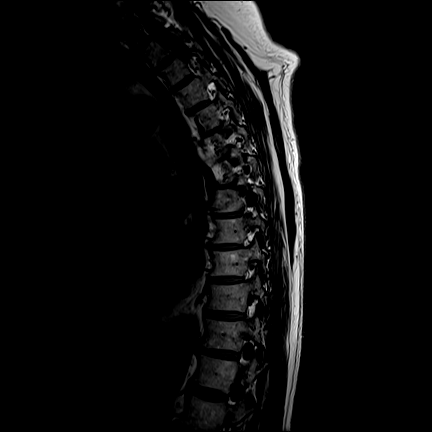
[im 19/19]
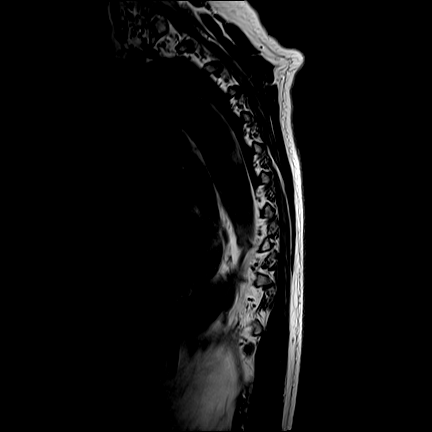

[Series 22: T2 · axial · 4.0mm · 0.59mm/px · z∈[-292,-78]mm · 8 of 39 slices shown (2 of 2)]
[im 1/39]
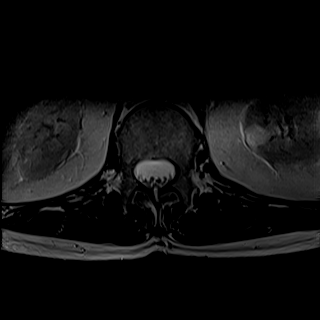
[im 6/39]
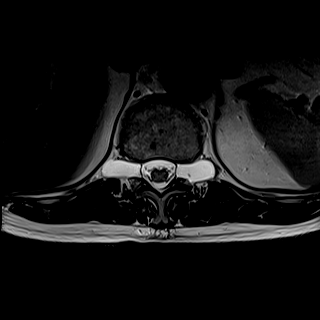
[im 11/39]
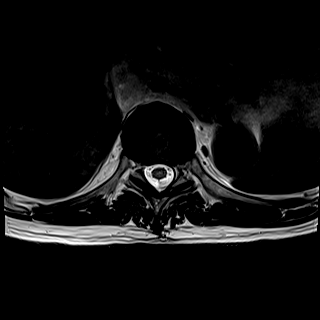
[im 17/39]
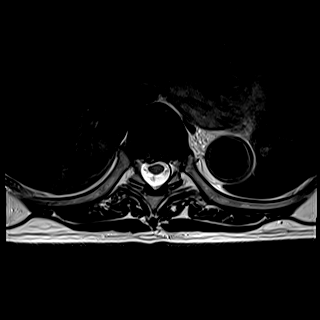
[im 22/39]
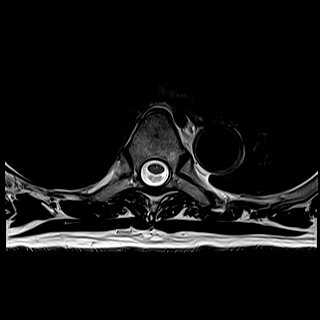
[im 28/39]
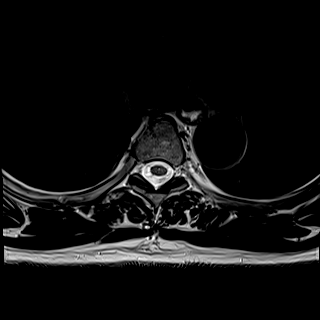
[im 33/39]
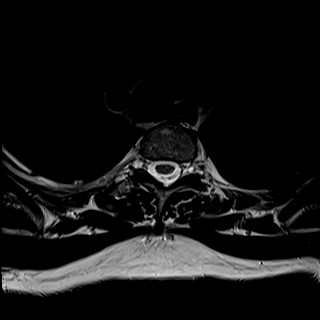
[im 39/39]
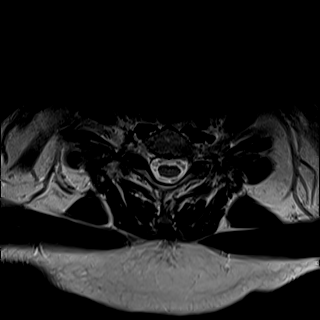

[Series 24: T1 · axial · non-contrast · 4.0mm · 0.37mm/px · z∈[-292,-78]mm · 8 of 39 slices shown (3 of 3)]
[im 1/39]
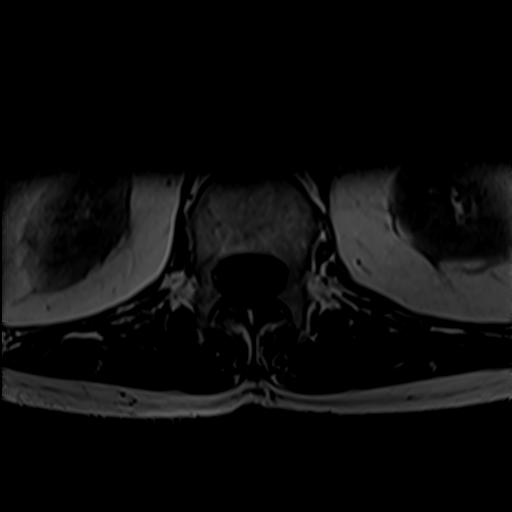
[im 6/39]
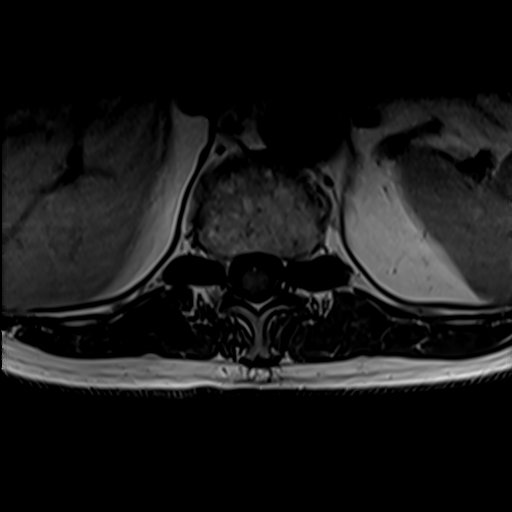
[im 11/39]
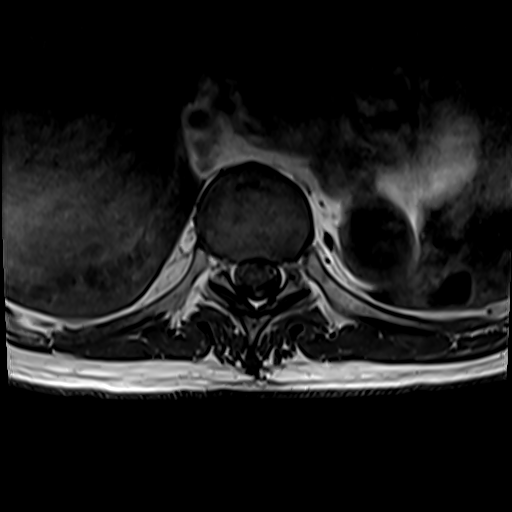
[im 17/39]
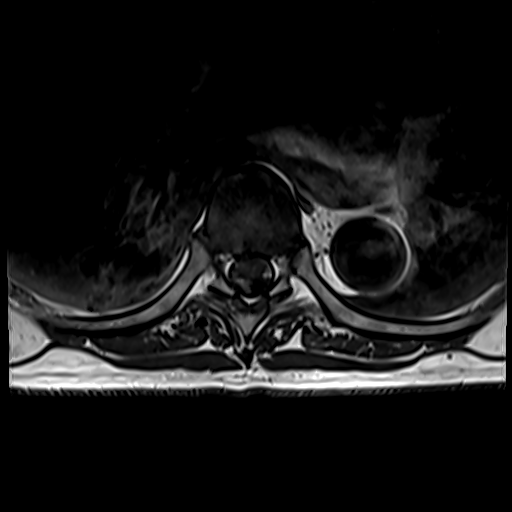
[im 22/39]
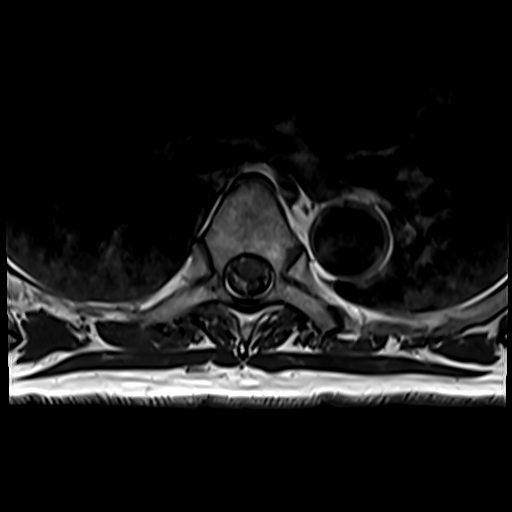
[im 28/39]
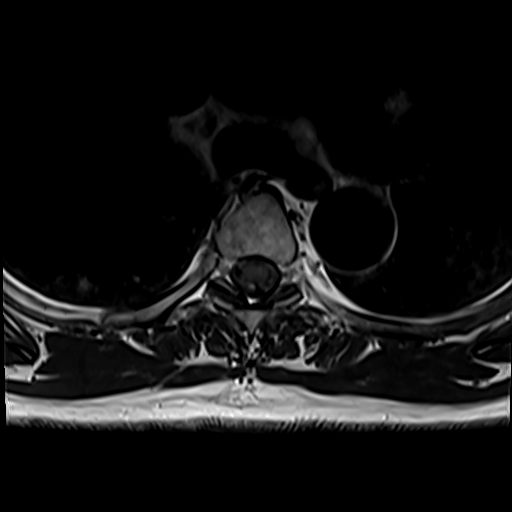
[im 33/39]
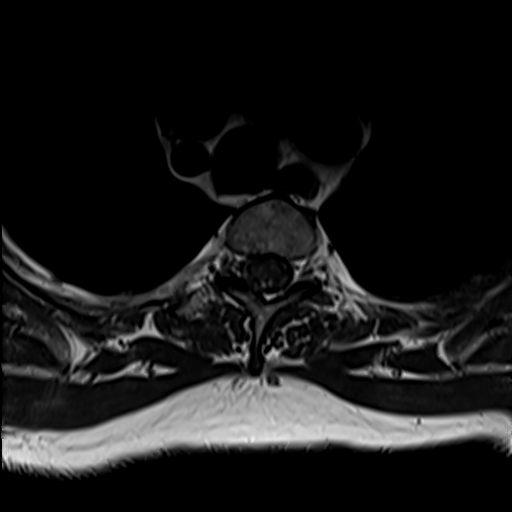
[im 39/39]
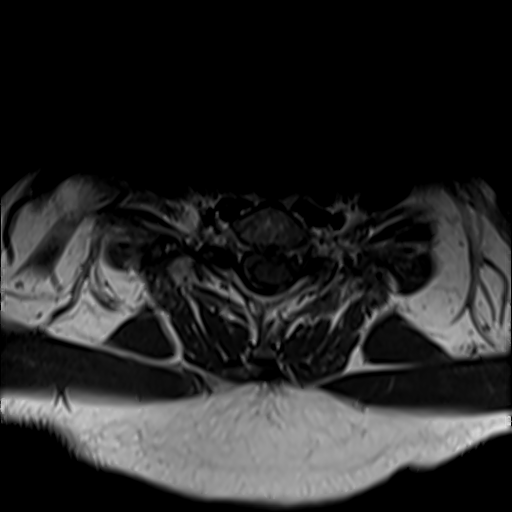

[Series 26: T1 fat-sat post-contrast · sagittal · 3.0mm · 0.84mm/px · 3 of 19 slices shown]
[im 1/19]
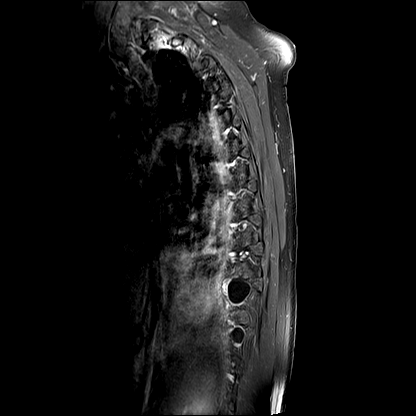
[im 7/19]
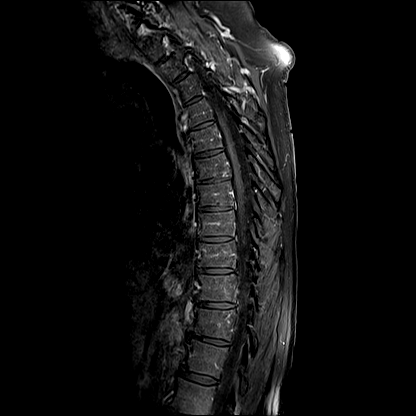
[im 13/19]
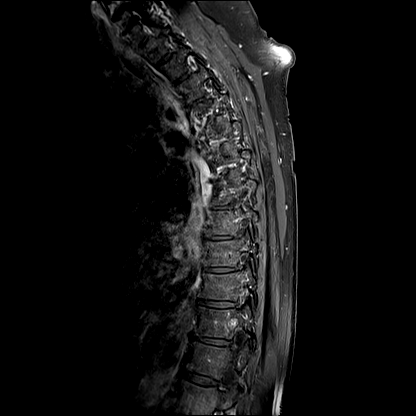

[27 of 48 positions shown; findings below may reference images not displayed]

FINDINGS: Alignment: Mildly exaggerated thoracic kyphosis. No substantial
sagittal subluxation.

Vertebrae: Vertebral body heights are maintained. No focal marrow
edema to suggest acute fracture or discitis/osteomyelitis. No
suspicious bone lesions. Small STIR hyperintense lesion within the
left T11 pedicle appears unchanged in comparison to [8E] MRI lumbar
spine, therefore considered benign. No abnormal enhancement the
vertebral bodies.

Cord:  Normal cord signal.  No abnormal enhancement of the cord.

Paraspinal and other soft tissues: Large hiatal hernia, better
characterized on prior CT chest. Linear opacity in the right lung,
likely atelectasis. Incompletely imaged probable left renal cyst.

Disc levels:

At T7-T8 right paracentral disc protrusion with annular fissure,
which contacts and flattens the ventral cord without significant
canal stenosis. Smaller disc protrusions at other levels without
significant canal stenosis. Chronic prominent perineural root sleeve
cysts at multiple levels, largest in the lower thoracic spine. Mild
multilevel facet arthropathy without significant foraminal stenosis.
IMPRESSION: 1. At T7-T8, right paracentral disc protrusion contacts and flattens
the ventral cord. No significant canal or foraminal stenosis.
2. Large hiatal hernia and probable right lung atelectasis.

## 2020-11-28 MED ORDER — GADOBUTROL 1 MMOL/ML IV SOLN
8.0000 mL | Freq: Once | INTRAVENOUS | Status: AC | PRN
  Administered 2020-11-28: 8 mL via INTRAVENOUS

## 2020-12-01 ENCOUNTER — Encounter: Payer: Self-pay | Admitting: Family Medicine

## 2020-12-06 ENCOUNTER — Encounter: Payer: Self-pay | Admitting: Hematology and Oncology

## 2020-12-06 ENCOUNTER — Encounter: Payer: Self-pay | Admitting: Family Medicine

## 2020-12-08 ENCOUNTER — Encounter: Payer: Self-pay | Admitting: Family Medicine

## 2020-12-08 ENCOUNTER — Ambulatory Visit (INDEPENDENT_AMBULATORY_CARE_PROVIDER_SITE_OTHER): Payer: Medicare Other | Admitting: Family Medicine

## 2020-12-08 VITALS — BP 104/76 | Ht 72.0 in | Wt 190.0 lb

## 2020-12-08 DIAGNOSIS — K5792 Diverticulitis of intestine, part unspecified, without perforation or abscess without bleeding: Secondary | ICD-10-CM

## 2020-12-08 DIAGNOSIS — I2510 Atherosclerotic heart disease of native coronary artery without angina pectoris: Secondary | ICD-10-CM

## 2020-12-08 MED ORDER — METRONIDAZOLE 500 MG PO TABS: 500.0000 mg | ORAL_TABLET | Freq: Two times a day (BID) | ORAL | 0 refills | Status: DC

## 2020-12-08 MED ORDER — CIPROFLOXACIN HCL 500 MG PO TABS: 500.0000 mg | ORAL_TABLET | Freq: Two times a day (BID) | ORAL | 0 refills | Status: DC

## 2020-12-08 NOTE — Progress Notes (Signed)
BP 104/76   Ht 6' (1.829 m)   Wt 190 lb (86.2 kg)   SpO2 97%   BMI 25.77 kg/m    Subjective:   Patient ID: Anthony Kelly, male    DOB: 1945-09-03, 75 y.o.   MRN: [id number]  HPI: Anthony Kelly is a 75 y.o. male presenting on 12/08/2020 for Abdominal Pain   HPI Patient comes in today complaining of abdominal pain that is been going on for the past 4 days.  He did have some nausea and vomiting fevers over the first day of it but has not had those since.  He did have diarrhea for day but no blood and that is since improved.  He thought he was doing better this morning but then just this afternoon he started having more abdominal pain.  He is abdominal pain was lower but now it is more upper.  He used to get diverticulitis a lot and thinks is very similar to that.  Relevant past medical, surgical, family and social history reviewed and updated as indicated. Interim medical history since our last visit reviewed. Allergies and medications reviewed and updated.  Review of Systems  Constitutional:  Positive for fever. Negative for chills.  Eyes:  Negative for visual disturbance.  Respiratory:  Negative for shortness of breath and wheezing.   Cardiovascular:  Negative for chest pain and leg swelling.  Gastrointestinal:  Positive for abdominal pain, diarrhea, nausea and vomiting.  Musculoskeletal:  Positive for back pain. Negative for gait problem.  Skin:  Negative for rash.  Neurological:  Negative for dizziness, weakness and numbness.  All other systems reviewed and are negative.  Per HPI unless specifically indicated above   Allergies as of 12/08/2020   No Known Allergies      Medication List        Accurate as of December 08, 2020  1:26 PM. If you have any questions, ask your nurse or doctor.          STOP taking these medications    predniSONE 20 MG tablet Commonly known as: DELTASONE Stopped by: [name] Dorla Guizar, MD       TAKE these medications     acyclovir 400 MG tablet Commonly known as: ZOVIRAX TAKE 1 TABLET TWICE A DAY   brimonidine 0.2 % ophthalmic solution Commonly known as: ALPHAGAN Place 1 drop into both eyes 2 (two) times daily.   cholecalciferol 25 MCG (1000 UNIT) tablet Commonly known as: VITAMIN D3 Take 1,000 Units by mouth daily.   ciprofloxacin 500 MG tablet Commonly known as: Cipro Take 1 tablet (500 mg total) by mouth 2 (two) times daily. Started by: [name] Costella Schwarz, MD   Eliquis 5 MG Tabs tablet Generic drug: apixaban TAKE 1 TABLET TWICE A DAY   flecainide 50 MG tablet Commonly known as: TAMBOCOR Take 1 tablet (50 mg total) by mouth 2 (two) times daily.   idelalisib 150 MG tablet Commonly known as: ZYDELIG Take 1 tablet (150 mg total) by mouth 2 (two) times daily.   Lumigan 0.01 % Soln Generic drug: bimatoprost Place 1 drop into both eyes daily.   meclizine 25 MG tablet Commonly known as: ANTIVERT Take 1 tablet (25 mg total) by mouth 3 (three) times daily as needed for dizziness.   metoprolol succinate 25 MG 24 hr tablet Commonly known as: Toprol XL Take 0.5 tablets (12.5 mg total) by mouth daily.   metroNIDAZOLE 500 MG tablet Commonly known as: FLAGYL Take 1 tablet (500 mg total) by mouth  2 (two) times daily. Started by: Worthy Rancher, MD   multivitamin with minerals Tabs tablet Take 1 tablet by mouth daily.   omeprazole 20 MG capsule Commonly known as: PRILOSEC Take 1 capsule (20 mg total) by mouth daily.   PROBIOTIC DAILY PO Take 1 capsule by mouth daily.   rosuvastatin 20 MG tablet Commonly known as: CRESTOR Take 1 tablet (20 mg total) by mouth daily.   terazosin 2 MG capsule Commonly known as: HYTRIN TAKE 1 CAPSULE AT BEDTIME   triamcinolone cream 0.1 % Commonly known as: KENALOG Apply 1 application topically 2 (two) times daily.   triamcinolone 0.025 % cream Commonly known as: KENALOG Apply 1 application topically 2 (two) times daily.   vitamin E 1000  UNIT capsule Take 1,000 Units by mouth daily.   Xiidra 5 % Soln Generic drug: Lifitegrast         Objective:   BP 104/76   Ht 6' (1.829 m)   Wt 190 lb (86.2 kg)   SpO2 97%   BMI 25.77 kg/m   Wt Readings from Last 3 Encounters:  12/08/20 190 lb (86.2 kg)  10/31/20 195 lb 3.2 oz (88.5 kg)  10/02/20 193 lb (87.5 kg)    Physical Exam Vitals and nursing note reviewed.  Constitutional:      General: He is not in acute distress.    Appearance: He is well-developed. He is not diaphoretic.  Abdominal:     General: Abdomen is flat. Bowel sounds are normal. There is no distension.     Palpations: Abdomen is soft.     Tenderness: There is abdominal tenderness in the epigastric area and left upper quadrant. There is no right CVA tenderness, left CVA tenderness, guarding or rebound. Negative signs include Murphy's sign.  Musculoskeletal:        General: No swelling.  Skin:    General: Skin is warm and dry.     Findings: No rash.  Neurological:     Mental Status: He is alert and oriented to person, place, and time.     Coordination: Coordination normal.  Psychiatric:        Behavior: Behavior normal.      Assessment & Plan:   Problem List Items Addressed This Visit   None Visit Diagnoses     Diverticulitis    -  Primary   Relevant Medications   ciprofloxacin (CIPRO) 500 MG tablet   metroNIDAZOLE (FLAGYL) 500 MG tablet       Instructed patient that we will treat as diverticulitis but to hold off until tomorrow to take the antibiotics, if he has no symptoms any further from today to tomorrow and then do not take the antibiotics but if he starts having symptoms again tomorrow than go ahead and take it. Follow up plan: Return if symptoms worsen or fail to improve.  Counseling provided for all of the vaccine components No orders of the defined types were placed in this encounter.   Caryl Pina, MD [facility] 12/08/2020, 1:26 PM

## 2020-12-11 DIAGNOSIS — Z20822 Contact with and (suspected) exposure to covid-19: Secondary | ICD-10-CM | POA: Diagnosis not present

## 2020-12-15 ENCOUNTER — Encounter: Payer: Self-pay | Admitting: Family Medicine

## 2020-12-18 DIAGNOSIS — Z6825 Body mass index (BMI) 25.0-25.9, adult: Secondary | ICD-10-CM | POA: Diagnosis not present

## 2020-12-18 DIAGNOSIS — M546 Pain in thoracic spine: Secondary | ICD-10-CM | POA: Diagnosis not present

## 2020-12-18 DIAGNOSIS — M545 Low back pain, unspecified: Secondary | ICD-10-CM | POA: Diagnosis not present

## 2020-12-22 ENCOUNTER — Encounter: Payer: Self-pay | Admitting: Hematology and Oncology

## 2020-12-22 ENCOUNTER — Telehealth: Payer: Self-pay | Admitting: Hematology and Oncology

## 2020-12-22 NOTE — Telephone Encounter (Signed)
Scheduled per sch msg. Called and spoke with patients wife. Confirmed appt

## 2020-12-23 DIAGNOSIS — Z86008 Personal history of in-situ neoplasm of other site: Secondary | ICD-10-CM | POA: Diagnosis not present

## 2020-12-23 DIAGNOSIS — L821 Other seborrheic keratosis: Secondary | ICD-10-CM | POA: Diagnosis not present

## 2020-12-23 DIAGNOSIS — W908XXS Exposure to other nonionizing radiation, sequela: Secondary | ICD-10-CM | POA: Diagnosis not present

## 2020-12-23 DIAGNOSIS — D485 Neoplasm of uncertain behavior of skin: Secondary | ICD-10-CM | POA: Diagnosis not present

## 2020-12-23 DIAGNOSIS — L578 Other skin changes due to chronic exposure to nonionizing radiation: Secondary | ICD-10-CM | POA: Diagnosis not present

## 2020-12-23 DIAGNOSIS — L570 Actinic keratosis: Secondary | ICD-10-CM | POA: Diagnosis not present

## 2020-12-23 DIAGNOSIS — Z85828 Personal history of other malignant neoplasm of skin: Secondary | ICD-10-CM | POA: Diagnosis not present

## 2020-12-23 DIAGNOSIS — L814 Other melanin hyperpigmentation: Secondary | ICD-10-CM | POA: Diagnosis not present

## 2020-12-24 DIAGNOSIS — C8580 Other specified types of non-Hodgkin lymphoma, unspecified site: Secondary | ICD-10-CM | POA: Diagnosis not present

## 2020-12-24 DIAGNOSIS — Z7901 Long term (current) use of anticoagulants: Secondary | ICD-10-CM | POA: Diagnosis not present

## 2020-12-24 DIAGNOSIS — M5414 Radiculopathy, thoracic region: Secondary | ICD-10-CM | POA: Diagnosis not present

## 2020-12-25 ENCOUNTER — Telehealth: Payer: Self-pay | Admitting: Cardiology

## 2020-12-25 NOTE — Telephone Encounter (Signed)
° °  Pre-operative Risk Assessment    Patient Name: Anthony Kelly  DOB: 09-20-1945 MRN: [id number]      Request for Surgical Clearance    Epidural Steroid Injection  Procedure:   epidural steroid injection   Date of Surgery:  Clearance 01/27/21                                 Surgeon:  Dr. Lenord Carbo Surgeon's Group or Practice Name:  [facility] Phone number:  [phone] Fax number:  [phone]   Type of Clearance Requested:   - Pharmacy:  Hold Apixaban (Eliquis)     Type of Anesthesia:  Not Indicated   Additional requests/questions:  Please advise surgeon/provider what medications should be held.  Signed, Desma Paganini   12/25/2020, 2:16 PM

## 2020-12-25 NOTE — Telephone Encounter (Signed)
Will route to pharm then call. Last OV 09/2020.

## 2020-12-26 NOTE — Telephone Encounter (Signed)
Clinical pharmacist to review Eliquis 

## 2020-12-29 NOTE — Telephone Encounter (Signed)
° °  Primary Cardiologist: Carlyle Dolly, MD  Chart reviewed as part of pre-operative protocol coverage. Given past medical history and time since last visit, based on ACC/AHA guidelines, Anthony Kelly would be at acceptable risk for the planned procedure without further cardiovascular testing.   Patient with diagnosis of atrial fibrillation on Eliquis for anticoagulation.     Procedure: epidural steroid injection Date of procedure: 01/27/21     CHA2DS2-VASc Score = 3   This indicates a 3.2% annual risk of stroke. The patient's score is based upon: CHF History: 0 HTN History: 0 Diabetes History: 0 Stroke History: 0 Vascular Disease History: 1 Age Score: 2 Gender Score: 0   CrCl 77 Platelet count 158   Per office protocol, patient can hold Eliquis for 3 days prior to procedure.   Patient will not need bridging with Lovenox (enoxaparin) around procedure.  Patient was advised that if he develops new symptoms prior to surgery to contact our office to arrange a follow-up appointment.  He verbalized understanding.  I will route this recommendation to the requesting party via Epic fax function and remove from pre-op pool.  Please call with questions.  Lenna Sciara, NP 12/29/2020, 1:45 PM

## 2020-12-29 NOTE — Telephone Encounter (Signed)
Patient with diagnosis of atrial fibrillation on Eliquis for anticoagulation.    Procedure: epidural steroid injection Date of procedure: 01/27/21   CHA2DS2-VASc Score = 3   This indicates a 3.2% annual risk of stroke. The patient's score is based upon: CHF History: 0 HTN History: 0 Diabetes History: 0 Stroke History: 0 Vascular Disease History: 1 Age Score: 2 Gender Score: 0   CrCl 77 Platelet count 158  Per office protocol, patient can hold Eliquis for 3 days prior to procedure.   Patient will not need bridging with Lovenox (enoxaparin) around procedure.

## 2021-01-11 DIAGNOSIS — Z20822 Contact with and (suspected) exposure to covid-19: Secondary | ICD-10-CM | POA: Diagnosis not present

## 2021-01-18 ENCOUNTER — Other Ambulatory Visit: Payer: Self-pay | Admitting: Cardiology

## 2021-01-19 ENCOUNTER — Other Ambulatory Visit: Payer: Self-pay | Admitting: Family Medicine

## 2021-01-20 ENCOUNTER — Inpatient Hospital Stay: Payer: Medicare Other

## 2021-01-20 ENCOUNTER — Encounter: Payer: Self-pay | Admitting: Hematology and Oncology

## 2021-01-20 ENCOUNTER — Inpatient Hospital Stay: Payer: Medicare Other | Attending: Hematology and Oncology | Admitting: Hematology and Oncology

## 2021-01-20 ENCOUNTER — Other Ambulatory Visit: Payer: Self-pay

## 2021-01-20 VITALS — BP 105/66 | HR 79 | Temp 98.0°F | Resp 18 | Ht 72.0 in | Wt 193.2 lb

## 2021-01-20 DIAGNOSIS — I700 Atherosclerosis of aorta: Secondary | ICD-10-CM | POA: Insufficient documentation

## 2021-01-20 DIAGNOSIS — C8280 Other types of follicular lymphoma, unspecified site: Secondary | ICD-10-CM

## 2021-01-20 DIAGNOSIS — D72819 Decreased white blood cell count, unspecified: Secondary | ICD-10-CM | POA: Insufficient documentation

## 2021-01-20 DIAGNOSIS — C8290 Follicular lymphoma, unspecified, unspecified site: Secondary | ICD-10-CM | POA: Diagnosis not present

## 2021-01-20 LAB — CBC WITH DIFFERENTIAL/PLATELET
Abs Immature Granulocytes: 0.01 10*3/uL (ref 0.00–0.07)
Basophils Absolute: 0.1 10*3/uL (ref 0.0–0.1)
Basophils Relative: 3 %
Eosinophils Absolute: 0.4 10*3/uL (ref 0.0–0.5)
Eosinophils Relative: 16 %
HCT: 45.3 % (ref 39.0–52.0)
Hemoglobin: 15.7 g/dL (ref 13.0–17.0)
Immature Granulocytes: 0 %
Lymphocytes Relative: 37 %
Lymphs Abs: 1 10*3/uL (ref 0.7–4.0)
MCH: 29.8 pg (ref 26.0–34.0)
MCHC: 34.7 g/dL (ref 30.0–36.0)
MCV: 86.1 fL (ref 80.0–100.0)
Monocytes Absolute: 1 10*3/uL (ref 0.1–1.0)
Monocytes Relative: 37 %
Neutro Abs: 0.2 10*3/uL — CL (ref 1.7–7.7)
Neutrophils Relative %: 7 %
Platelets: 170 10*3/uL (ref 150–400)
RBC: 5.26 MIL/uL (ref 4.22–5.81)
RDW: 13.1 % (ref 11.5–15.5)
WBC: 2.8 10*3/uL — ABNORMAL LOW (ref 4.0–10.5)
nRBC: 0 % (ref 0.0–0.2)

## 2021-01-20 LAB — COMPREHENSIVE METABOLIC PANEL
ALT: 18 U/L (ref 0–44)
AST: 19 U/L (ref 15–41)
Albumin: 4.3 g/dL (ref 3.5–5.0)
Alkaline Phosphatase: 49 U/L (ref 38–126)
Anion gap: 8 (ref 5–15)
BUN: 12 mg/dL (ref 8–23)
CO2: 27 mmol/L (ref 22–32)
Calcium: 9.4 mg/dL (ref 8.9–10.3)
Chloride: 103 mmol/L (ref 98–111)
Creatinine, Ser: 1.15 mg/dL (ref 0.61–1.24)
GFR, Estimated: >60 mL/min (ref >60)
Glucose, Bld: 99 mg/dL (ref 70–99)
Potassium: 4.5 mmol/L (ref 3.5–5.1)
Sodium: 138 mmol/L (ref 135–145)
Total Bilirubin: 1 mg/dL (ref 0.3–1.2)
Total Protein: 6.5 g/dL (ref 6.5–8.1)

## 2021-01-20 NOTE — Progress Notes (Deleted)
.  criti

## 2021-01-20 NOTE — Assessment & Plan Note (Signed)
The patient is completely asymptomatic We have discontinued idelalisib recently His mild leukopenia is due to this I recommend CT imaging for review If he has no evidence of disease, we will just put him on observation

## 2021-01-20 NOTE — Progress Notes (Signed)
CRITICAL VALUE STICKER  CRITICAL VALUE: 0.2 ANC  RECEIVER (on-site recipient of call): Harrel Lemon, RN  [place] NOTIFIED: [phone] 01/20/21  MESSENGER (representative from lab): Billee Cashing  MD NOTIFIED: Dr. [name]  TIME OF NOTIFICATION:12:18 pm 01/20/21  RESPONSE:  Will see [name] in the office today

## 2021-01-20 NOTE — Progress Notes (Signed)
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    Answer:   [facility]    Order Specific Question:   Radiology Contrast Protocol - do NOT remove file path    Answer:   [email or web address]    All questions were answered. The patient knows to call the clinic with any problems, [name], [name], MD 01/20/2021 3:05 PM  INTERVAL HISTORY: Please see below for problem oriented charting. he returns for treatment follow-up with his wife He was taking idelalisib for follicular lymphoma until recently when the drug manufacturer withdrawal indication for follicular lymphoma He completed his last pill recently No recent  infection, fever or chills He is returning here for injection next week for back pain  REVIEW OF SYSTEMS:   Constitutional: Denies fevers, chills or abnormal weight loss Eyes: Denies blurriness of vision Ears, nose, mouth, throat, and face: Denies mucositis or sore throat Respiratory: Denies cough, dyspnea or wheezes Cardiovascular: Denies palpitation, chest discomfort or lower extremity swelling Gastrointestinal:  Denies nausea, heartburn or change in bowel habits Skin: Denies abnormal skin rashes Lymphatics: Denies new lymphadenopathy or easy bruising Neurological:Denies numbness, tingling or new weaknesses Behavioral/Psych: Mood is stable, no new changes  All other systems were reviewed with the patient and are negative.  I have reviewed the past medical history, past surgical history, social history and family history with the patient and they are unchanged from previous note.  ALLERGIES:  has No Known Allergies.  MEDICATIONS:  Current Outpatient Medications  Medication Sig Dispense Refill   acyclovir (ZOVIRAX) 400 MG tablet TAKE 1 TABLET TWICE A DAY 180 tablet 3   apixaban (ELIQUIS) 5 MG TABS tablet TAKE 1 TABLET TWICE A DAY 180 tablet 1   brimonidine (ALPHAGAN) 0.2 % ophthalmic solution Place 1 drop into both eyes 2 (two) times daily.      cholecalciferol (VITAMIN D3) 25 MCG (1000 UNIT) tablet Take 1,000 Units by mouth daily.     flecainide (TAMBOCOR) 50 MG tablet Take 1 tablet (50 mg total) by mouth 2 (two) times daily. 180 tablet 3   Lifitegrast (XIIDRA) 5 % SOLN      LUMIGAN 0.01 % SOLN Place 1 drop into both eyes daily.     meclizine (ANTIVERT) 25 MG tablet Take 1 tablet (25 mg total) by mouth 3 (  three) times daily as needed for dizziness. 30 tablet 1   metoprolol succinate (TOPROL-XL) 25 MG 24 hr tablet TAKE ONE-HALF (1/2) TABLET DAILY 45 tablet 3   Multiple Vitamin (MULTIVITAMIN WITH MINERALS) TABS tablet Take 1 tablet by mouth daily.      omeprazole (PRILOSEC) 20 MG  capsule Take 1 capsule (20 mg total) by mouth daily. 90 capsule 3   Probiotic Product (PROBIOTIC DAILY PO) Take 1 capsule by mouth daily.      rosuvastatin (CRESTOR) 20 MG tablet Take 1 tablet (20 mg total) by mouth daily. 90 tablet 3   terazosin (HYTRIN) 2 MG capsule TAKE 1 CAPSULE AT BEDTIME 90 capsule 0   vitamin E 1000 UNIT capsule Take 1,000 Units by mouth daily.     No current facility-administered medications for this visit.    SUMMARY OF ONCOLOGIC HISTORY: Oncology History Overview Note  Low grade B-cell lymphoma   Primary site: Lymphoid Neoplasms   Staging method: AJCC 6th Edition   Clinical: Stage IV signed by [name], MD on 09/26/2013  9:15 AM   Summary: Stage IV      Follicular low grade B-cell lymphoma ([place])  12/10/2003 Surgery   Inguinal lymph node biopsy showed follicular lymphoma.   12/12/2003 - 06/01/2004 Chemotherapy   He was treated with R. CHOP chemotherapy which show complete remission. The number of cycles of R. CHOP chemotherapy was unknown.   12/19/2006 Surgery   Lung resection show follicular lymphoma.   01/02/2007 - 09/01/2008 Chemotherapy   The patient was treated with single agent rituximab alone.   01/19/2007 Bone Marrow Biopsy   Bone marrow biopsy was negative.   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   05/08/2013 - 05/11/2013 Hospital Admission   The patient was admitted to the hospital for management of pericarditis. CT scan showed extensive lymphadenopathy.   06/07/2013 Imaging   PET/CT scan showed extensive lymphadenopathy   06/25/2013 Procedure   He has placement of Infuse-a-Port.   06/28/2013 Bone Marrow Biopsy   Bone marrow biopsy is positive for lymphoma involvement.   07/04/2013 - 11/22/2013 Chemotherapy   He is treated with 6 cycles of bendamustine with rituximab.   09/24/2013 Imaging   Repeat PET scan show complete remission.   12/26/2013 Imaging   PEt scan showed complete remission    09/26/2014 Imaging   CT scan of the chest abdomen and pelvis show no evidence of disease   03/26/2015 Imaging   CT scan showed no evidence of lymphoma   04/14/2016 Imaging   CT: Borderline prominent right hilar and subcarinal lymph nodes, but not appreciably changed. 2. Low-grade but increased central mesenteric stranding with some small mesenteric lymph nodes. This could certainly be inflammatory, 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, much of which is likely a physiologic response to cholecystectomy. 7. Sigmoid colon diverticulosis. 8.  Prominent stool throughout the colon favors constipation. 9. Enlarged prostate gland, volume estimated at 75 cubic cm.   02/15/2017 Imaging   No evidence of recurrent lymphoma or other acute findings.  Stable moderate hiatal hernia.  Colonic diverticulosis, without radiographic evidence of diverticulitis.  Stable mildly enlarged prostate.  Mild emphysema.  Aortic and coronary artery atherosclerosis.   01/31/2019 Imaging  1. Interval development of abdominal and pelvic adenopathy compatible with recurrent lymphoma. 2. Emphysema and aortic atherosclerosis. 3. Multi vessel coronary artery calcifications. 4. Moderate to large hiatal hernia   02/09/2019 Procedure   Successful CT-guided core biopsy of the left retroperitoneal periaortic adenopathy   02/09/2019 Pathology Results   SURGICAL PATHOLOGY  CASE: [id number]  PATIENT: Anthony Kelly  Surgical Pathology Report   Clinical History: Lymphoma; [name], LEFT PARA AORTIC, NEEDLE CORE BIOPSY:  -  Follicular lymphoma   -  See comment   COMMENT:   The biopsy consists of four fragmented lymph node cores with a vaguely nodular proliferation pattern.  The lymphoid population is composed of small to medium lymphocytes with irregular, cleaved nuclei and scant cytoplasm.  By immunohistochemistry, [name], [name], [name], [name] is low (less than 10%).  Flow cytometry was attempted; however, there was insufficient material for analysis (see [id number]).  Overall, the features are consistent with relapse of the patient's previously diagnosed follicular lymphoma.  Based on the biopsy, this is favored to be a low-grade follicular lymphoma   09/11/4780 -  Chemotherapy   The patient had idelisib for chemotherapy treatment.     05/17/2019 Imaging   1. Interval generalized decrease in abdominopelvic lymphadenopathy identified on the previous study. No new or progressive lymphadenopathy on today's study. 2. Moderate hiatal hernia. 3.  Emphysema (ICD10-J43.9) and Aortic Atherosclerosis (ICD10-170.0)   11/16/2019 Imaging   IMPRESSION: Chest Impression:   1. No mediastinal lymphadenopathy. 2. Post RIGHT lung wedge resection without evidence local recurrence.   Abdomen / Pelvis Impression:   1. Stable numerous small periaortic retroperitoneal nodes and common iliac nodes. No progression of adenopathy. 2. No splenomegaly.  No skeletal metastasis.     07/31/2020 Imaging   1. Stable examination. No significant interval change in the prominent/mildly enlarged lymph nodes below the diaphragm. No thoracic adenopathy. No splenomegaly. 2. Postsurgical change of right lung wedge resection without evidence of local recurrence. 3. Large hiatal hernia. 4. Colonic diverticulosis without findings of acute  diverticulitis. 5. Aortic Atherosclerosis (ICD10-I70.0) and Emphysema (ICD10-J43.9).       PHYSICAL EXAMINATION: ECOG PERFORMANCE STATUS: 0 - Asymptomatic  Vitals:   01/20/21 1208  BP: 105/66  Pulse: 79  Resp: 18  Temp: 98 F (36.7 C)  SpO2: 95%   Filed Weights   01/20/21 1208  Weight: 193 lb 3.2 oz (87.6 kg)    GENERAL:alert, no distress and comfortable SKIN: skin color, texture, turgor are normal, no rashes or significant lesions EYES: normal, Conjunctiva are pink and non-injected, sclera clear OROPHARYNX:no exudate, no erythema and lips, buccal mucosa, and tongue normal  NECK: supple, thyroid normal size, non-tender, without nodularity LYMPH:  no palpable lymphadenopathy in the cervical, axillary or inguinal LUNGS: clear to auscultation and percussion with normal breathing effort HEART: regular rate & rhythm and no murmurs and no lower extremity edema ABDOMEN:abdomen soft, non-tender and normal bowel sounds Musculoskeletal:no cyanosis of digits and no clubbing  NEURO: alert & oriented x 3 with fluent speech, no focal motor/sensory deficits  LABORATORY DATA:  I have reviewed the data as listed    Component Value Date/Time   NA 138 01/20/2021 1143   NA 142 09/30/2020 1014   NA 139  04/14/2016 0943   K 4.5 01/20/2021 1143   K 4.6 04/14/2016 0943   CL 103 01/20/2021 1143   CL 104 03/24/2012 1024   CO2 27 01/20/2021 1143   CO2 25 04/14/2016 0943   GLUCOSE 99 01/20/2021 1143   GLUCOSE 111 04/14/2016 0943   GLUCOSE 80 03/24/2012 1024   BUN 12 01/20/2021 1143   BUN 12 09/30/2020 1014   BUN 8.6 04/14/2016 0943   CREATININE 1.15 01/20/2021 1143   CREATININE 1.00 10/31/2020 1004   CREATININE 1.1 04/14/2016 0943   CALCIUM 9.4 01/20/2021 1143   CALCIUM 9.4 04/14/2016 0943   PROT 6.5 01/20/2021 1143   PROT 6.2 09/30/2020 1014   PROT 6.5 04/14/2016 0943   ALBUMIN 4.3 01/20/2021 1143   ALBUMIN 4.5 09/30/2020 1014   ALBUMIN 3.8 04/14/2016 0943   AST 19 01/20/2021  1143   AST 23 10/31/2020 1004   AST 28 04/14/2016 0943   ALT 18 01/20/2021 1143   ALT 20 10/31/2020 1004   ALT 41 04/14/2016 0943   ALKPHOS 49 01/20/2021 1143   ALKPHOS 71 04/14/2016 0943   BILITOT 1.0 01/20/2021 1143   BILITOT 0.6 10/31/2020 1004   BILITOT 0.61 04/14/2016 0943   GFRNONAA >60 01/20/2021 1143   GFRNONAA >60 10/31/2020 1004   GFRAA 69 09/27/2019 0801    No results found for: SPEP, UPEP  Lab Results  Component Value Date   WBC 2.8 (L) 01/20/2021   NEUTROABS 0.2 (LL) 01/20/2021   HGB 15.7 01/20/2021   HCT 45.3 01/20/2021   MCV 86.1 01/20/2021   PLT 170 01/20/2021      Chemistry      Component Value Date/Time   NA 138 01/20/2021 1143   NA 142 09/30/2020 1014   NA 139 04/14/2016 0943   K 4.5 01/20/2021 1143   K 4.6 04/14/2016 0943   CL 103 01/20/2021 1143   CL 104 03/24/2012 1024   CO2 27 01/20/2021 1143   CO2 25 04/14/2016 0943   BUN 12 01/20/2021 1143   BUN 12 09/30/2020 1014   BUN 8.6 04/14/2016 0943   CREATININE 1.15 01/20/2021 1143   CREATININE 1.00 10/31/2020 1004   CREATININE 1.1 04/14/2016 0943      Component Value Date/Time   CALCIUM 9.4 01/20/2021 1143   CALCIUM 9.4 04/14/2016 0943   ALKPHOS 49 01/20/2021 1143   ALKPHOS 71 04/14/2016 0943   AST 19 01/20/2021 1143   AST 23 10/31/2020 1004   AST 28 04/14/2016 0943   ALT 18 01/20/2021 1143   ALT 20 10/31/2020 1004   ALT 41 04/14/2016 0943   BILITOT 1.0 01/20/2021 1143   BILITOT 0.6 10/31/2020 1004   BILITOT 0.61 04/14/2016 0943

## 2021-01-20 NOTE — Assessment & Plan Note (Signed)
This is due to his recent treatment He is not symptomatic Observe only

## 2021-01-21 ENCOUNTER — Other Ambulatory Visit: Payer: Self-pay | Admitting: Cardiology

## 2021-01-21 ENCOUNTER — Encounter: Payer: Self-pay | Admitting: Hematology and Oncology

## 2021-01-21 NOTE — Telephone Encounter (Signed)
Prescription refill request for Eliquis received. Indication: PAF Last office visit: 09/12/20  Lenna Sciara Allred MD Scr: 1.15 on 01/20/21 Age: 76 Weight: 86.8kg  Based on above findings Eliquis 5mg  twice daily is the appropriate dose.  Refill approved.

## 2021-01-22 ENCOUNTER — Encounter: Payer: Self-pay | Admitting: Internal Medicine

## 2021-01-26 ENCOUNTER — Encounter: Payer: Self-pay | Admitting: Nurse Practitioner

## 2021-01-26 ENCOUNTER — Telehealth: Payer: Self-pay

## 2021-01-26 ENCOUNTER — Encounter: Payer: Self-pay | Admitting: Hematology and Oncology

## 2021-01-26 ENCOUNTER — Ambulatory Visit (INDEPENDENT_AMBULATORY_CARE_PROVIDER_SITE_OTHER): Payer: Medicare Other

## 2021-01-26 ENCOUNTER — Ambulatory Visit (INDEPENDENT_AMBULATORY_CARE_PROVIDER_SITE_OTHER): Payer: Medicare Other | Admitting: Nurse Practitioner

## 2021-01-26 VITALS — BP 105/62 | HR 73 | Temp 97.8°F | Ht 72.0 in | Wt 193.2 lb

## 2021-01-26 DIAGNOSIS — R509 Fever, unspecified: Secondary | ICD-10-CM

## 2021-01-26 DIAGNOSIS — R051 Acute cough: Secondary | ICD-10-CM

## 2021-01-26 DIAGNOSIS — R0602 Shortness of breath: Secondary | ICD-10-CM | POA: Diagnosis not present

## 2021-01-26 DIAGNOSIS — R062 Wheezing: Secondary | ICD-10-CM | POA: Diagnosis not present

## 2021-01-26 DIAGNOSIS — K449 Diaphragmatic hernia without obstruction or gangrene: Secondary | ICD-10-CM | POA: Diagnosis not present

## 2021-01-26 LAB — VERITOR FLU A/B WAIVED
Influenza A: NEGATIVE
Influenza B: NEGATIVE

## 2021-01-26 IMAGING — DX DG CHEST 2V
3 series · 3 of 3 positions shown · non-contrast
Comparison: Chest x-ray dated [DATE].

CLINICAL DATA: Wheezing.

EXAM:
CHEST - 2 VIEW

[chest pa]
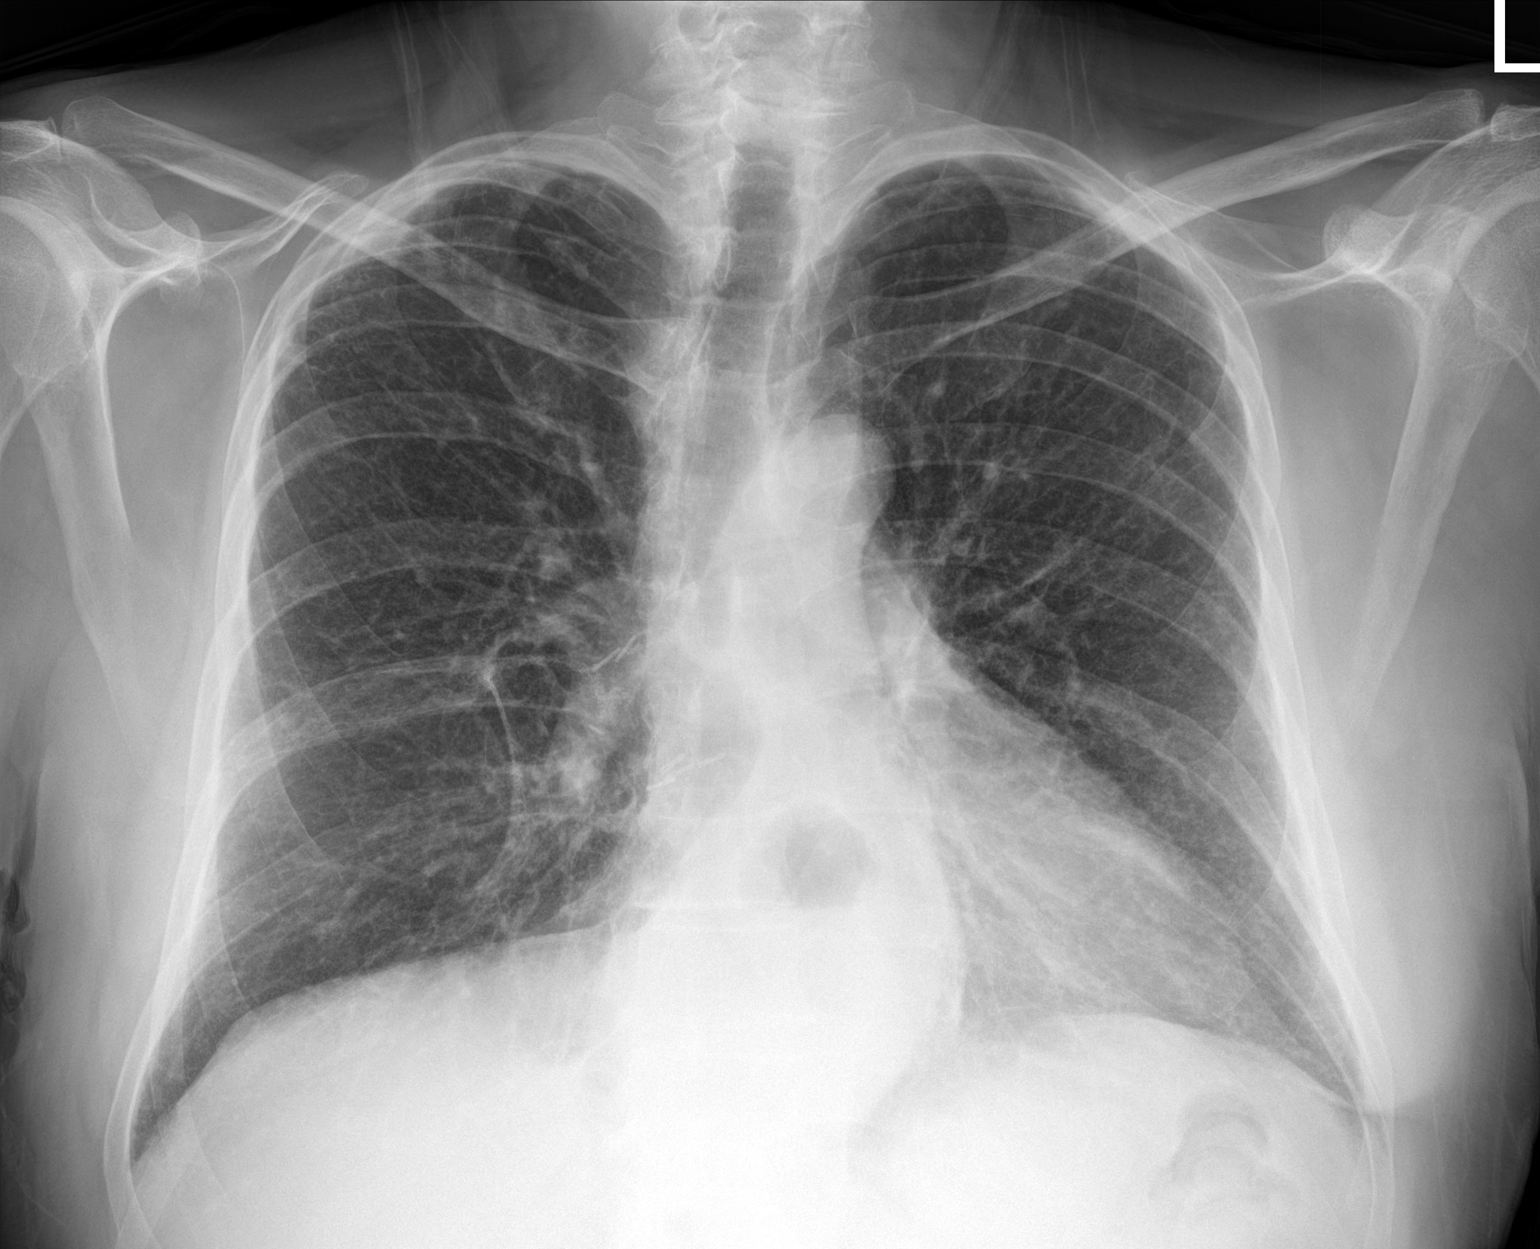

[chest lat (1 of 2)]
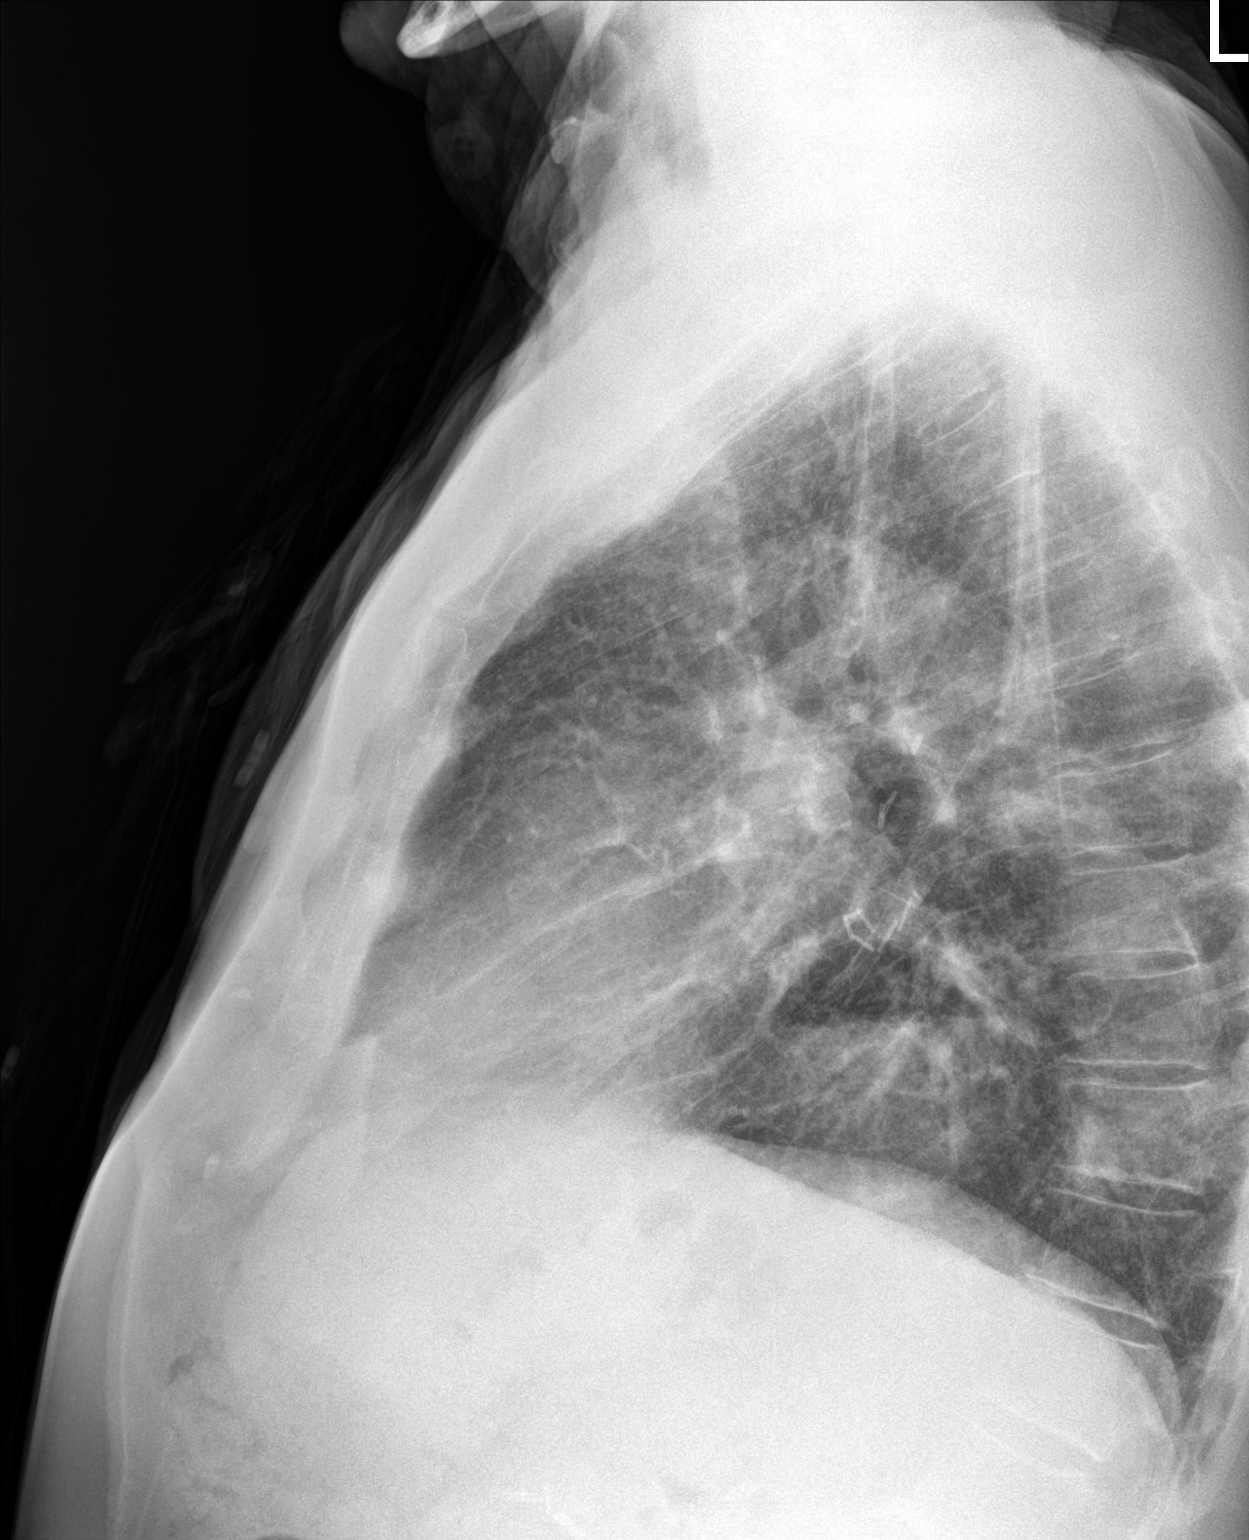

[chest lat (2 of 2)]
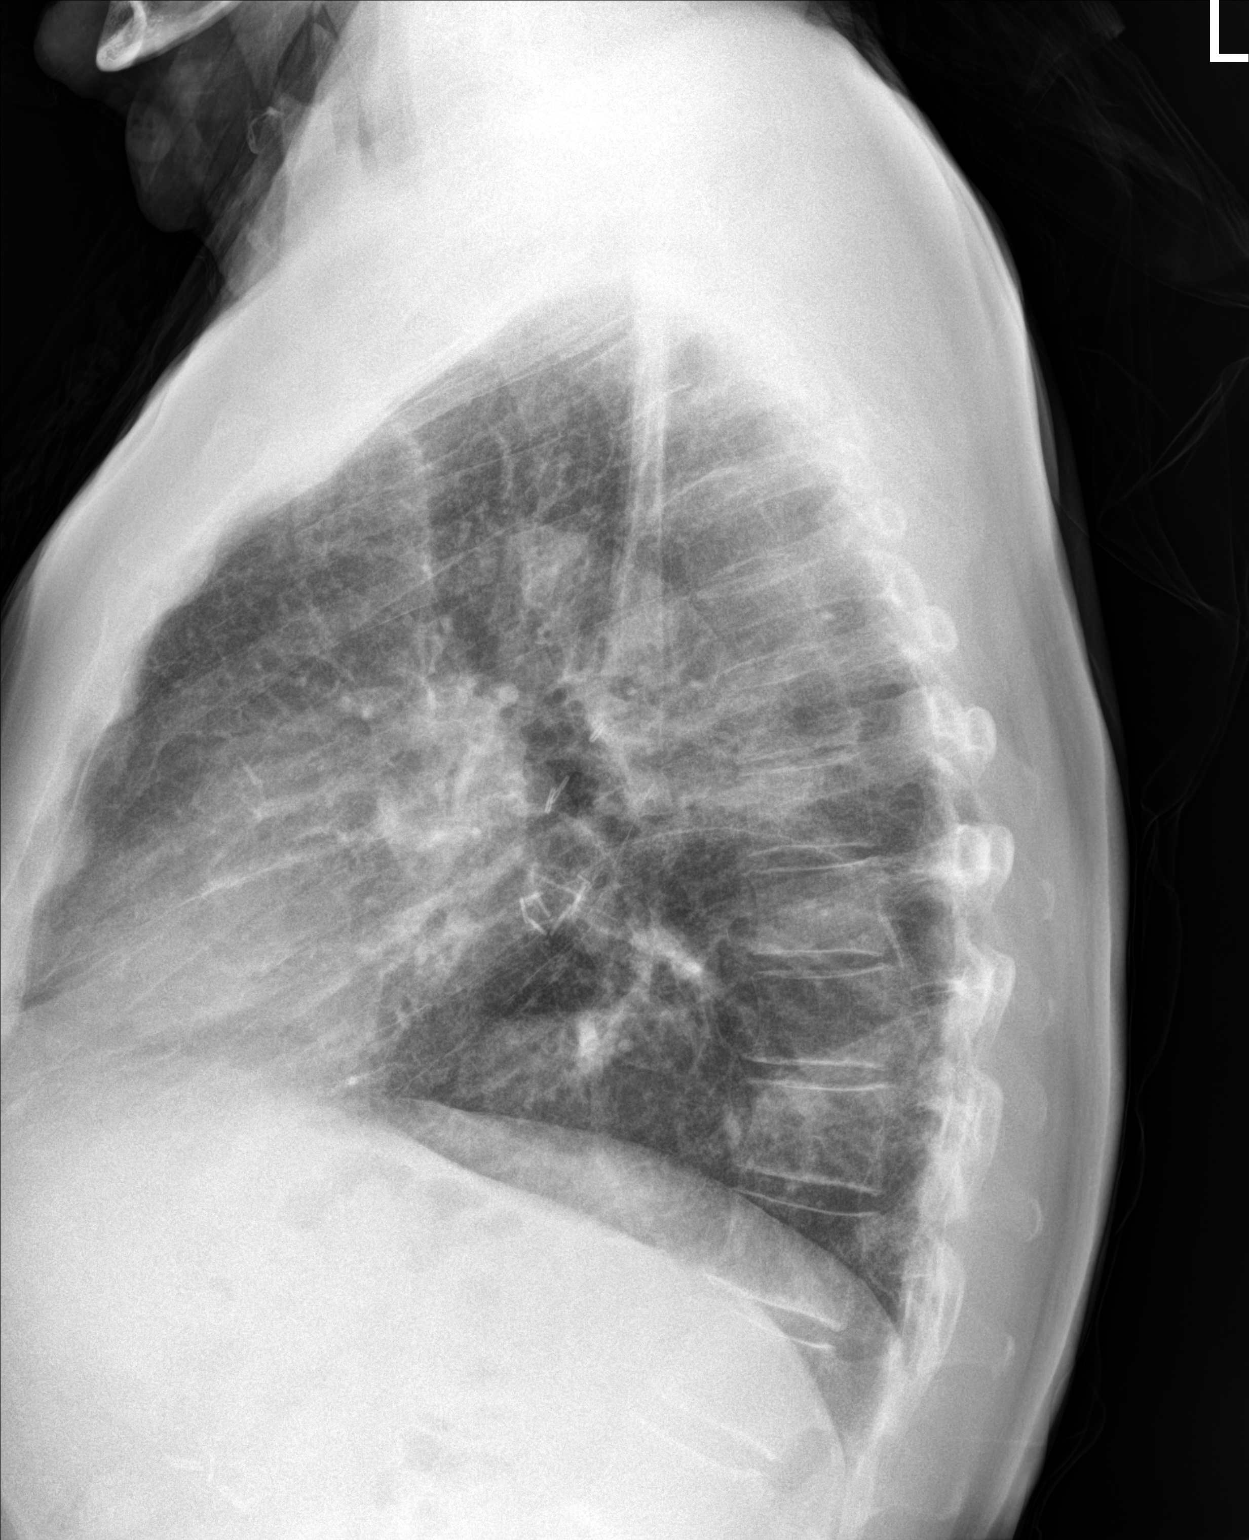

[3 of 3 positions shown; findings below may reference images not displayed]

FINDINGS: Normal heart size. Unchanged large hiatal hernia. Similar
postsurgical changes near the right hilum. No focal consolidation,
pleural effusion, or pneumothorax. Unchanged mild scarring in the
medial right lower lobe. No acute osseous abnormality.
IMPRESSION: 1. No acute cardiopulmonary disease.

## 2021-01-26 NOTE — Telephone Encounter (Signed)
Called back and given below message to Dolliver. She verbalized understanding and will take him to be evaluated.

## 2021-01-26 NOTE — Patient Instructions (Signed)
Cough, Adult A cough helps to clear your throat and lungs. A cough may be a sign of an illness or another medical condition. An acute cough may only last 2-3 weeks, while a chronic cough may last 8 or more weeks. Many things can cause a cough. They include: Germs (viruses or bacteria) that attack the airway. Breathing in things that bother (irritate) your lungs. Allergies. Asthma. Mucus that runs down the back of your throat (postnasal drip). Smoking. Acid backing up from the stomach into the tube that moves food from the mouth to the stomach (gastroesophageal reflux). Some medicines. Lung problems. Other medical conditions, such as heart failure or a blood clot in the lung (pulmonary embolism). Follow these instructions at home: Medicines Take over-the-counter and prescription medicines only as told by your doctor. Talk with your doctor before you take medicines that stop a cough (cough suppressants). Lifestyle  Do not smoke, and try not to be around smoke. Do not use any products that contain nicotine or tobacco, such as cigarettes, e-cigarettes, and chewing tobacco. If you need help quitting, ask your doctor. Drink enough fluid to keep your pee (urine) pale yellow. Avoid caffeine. Do not drink alcohol if your doctor tells you not to drink. General instructions  Watch for any changes in your cough. Tell your doctor about them. Always cover your mouth when you cough. Stay away from things that make you cough, such as perfume, candles, campfire smoke, or cleaning products. If the air is dry, use a cool mist vaporizer or humidifier in your home. If your cough is worse at night, try using extra pillows to raise your head up higher while you sleep. Rest as needed. Keep all follow-up visits as told by your doctor. This is important. Contact a doctor if: You have new symptoms. You cough up pus. Your cough does not get better after 2-3 weeks, or your cough gets worse. Cough medicine  does not help your cough and you are not sleeping well. You have pain that gets worse or pain that is not helped with medicine. You have a fever. You are losing weight and you do not know why. You have night sweats. Get help right away if: You cough up blood. You have trouble breathing. Your heartbeat is very fast. These symptoms may be an emergency. Do not wait to see if the symptoms will go away. Get medical help right away. Call your local emergency services (911 in the [place] Do not drive yourself to the hospital. Summary A cough helps to clear your throat and lungs. Many things can cause a cough. Take over-the-counter and prescription medicines only as told by your doctor. Always cover your mouth when you cough. Contact a doctor if you have new symptoms or you have a cough that does not get better or gets worse. This information is not intended to replace advice given to you by your health care provider. Make sure you discuss any questions you have with your health care provider. Document Revised: 02/16/2019 Document Reviewed: 01/16/2018 Elsevier Patient Education  [place] of Breath, Adult Shortness of breath means you have trouble breathing. Shortness of breath could be a sign of a medical problem. Follow these instructions at home: Pollution Do not smoke or use any products that contain nicotine or tobacco. If you need help quitting, ask your doctor. Avoid things that can make it harder to breathe, such as: Smoke of all kinds. This includes smoke from campfires or forest fires. Do not  smoke or allow others to smoke in your home. Mold. Dust. Air pollution. Chemical smells. Things that can give you an allergic reaction (allergens) if you have allergies. Keep your living space clean. Use products that help remove mold and dust. General instructions Watch for any changes in your symptoms. Take over-the-counter and prescription medicines only as told by your  doctor. This includes oxygen therapy and inhaled medicines. Rest as needed. Return to your normal activities when your doctor says that it is safe. Keep all follow-up visits. Contact a doctor if: Your condition does not get better as soon as expected. You have a hard time doing your normal activities, even after you rest. You have new symptoms. You cannot walk up stairs. You cannot exercise the way you normally do. Get help right away if: Your shortness of breath gets worse. You have trouble breathing when you are resting. You feel light-headed or you faint. You have a cough that is not helped by medicines. You cough up blood. You have pain with breathing. You have pain in your chest, arms, shoulders, or belly (abdomen). You have a fever. These symptoms may be an emergency. Get help right away. Call 911. Do not wait to see if the symptoms will go away. Do not drive yourself to the hospital. Summary Shortness of breath is when you have trouble breathing enough air. It can be a sign of a medical problem. Avoid things that make it hard for you to breathe, such as smoking, pollution, mold, and dust. Watch for any changes in your symptoms. Contact your doctor if you do not get better or you get worse. This information is not intended to replace advice given to you by your health care provider. Make sure you discuss any questions you have with your health care provider. Document Revised: 08/16/2020 Document Reviewed: 08/16/2020 Elsevier Patient Education  [place].

## 2021-01-26 NOTE — Progress Notes (Signed)
Acute Office Visit  Subjective:    Patient ID: Anthony Kelly, male    DOB: 1945-05-30, 76 y.o.   MRN: [id number]  Chief Complaint  Patient presents with   Fever   Chills   Shortness of Breath    Fever  This is a new problem. The current episode started yesterday. The problem occurs intermittently. The problem has been gradually improving. The maximum temperature noted was 100 to 100.9 F. Associated symptoms include congestion, coughing and headaches. Pertinent negatives include no abdominal pain, chest pain, ear pain or sore throat. He has tried acetaminophen for the symptoms. The treatment provided moderate relief.  Risk factors: hx of cancer   Risk factors: no contaminated food   Shortness of Breath This is a new problem. The current episode started in the past 7 days. The problem occurs constantly. The problem has been unchanged. Associated symptoms include a fever and headaches. Pertinent negatives include no abdominal pain, chest pain, ear pain, leg pain or sore throat.   Past Medical History:  Diagnosis Date   Acute pericarditis    a. 04/2013 -adm with CP, elevated CRP. H/o coronary artery calcification on prior CT but nuc was negative, EF 71%.   Arthritis    Atrial fibrillation ([place])    a. Isolated episode in the setting of acute pericarditis 04/2013. Was not placed on anticoag.   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   lt submandibular ln   Pain in joint, pelvic region and thigh 08/01/2013    PSVT (paroxysmal supraventricular tachycardia) ([facility])    SCC (squamous cell carcinoma)    Left cheek    Past Surgical History:  Procedure Laterality Date   CHOLECYSTECTOMY     EXPLORATORY LAPAROTOMY     EYE SURGERY Right 2016   cataract   LEFT HEART CATH AND CORONARY ANGIOGRAPHY N/A 06/05/2019   Procedure: LEFT HEART CATH AND CORONARY ANGIOGRAPHY;  Surgeon: Jettie Booze, MD;  Location: [facility];  Service: Cardiovascular;  Laterality: N/A;   LUNG LOBECTOMY     right side   LYMPH NODE BIOPSY     in groin with removal   MENISCUS REPAIR     right knee   MOHS SURGERY Left 09/2020   cheek   PROSTATE SURGERY     REFRACTIVE SURGERY Right    piece of metal removed   SUBMANDIBULAR GLAND EXCISION  04/2011   SUBMANDIBULAR GLAND EXCISION  04/30/2011   Procedure: EXCISION SUBMANDIBULAR GLAND;  Surgeon: Jerrell Belfast, MD;  Location: [facility];  Service: ENT;  Laterality: Left;  WITH DIAGNOSTIC BIOPSY   TONSILLECTOMY     as a child   VEIN LIGATION AND STRIPPING     right leg    Family History  Problem Relation Age of Onset   Heart disease Father    Heart attack Father        x 3   Cancer Sister        breast ca  Cancer Brother        prostate ca   Heart attack Sister    Cancer Sister        breast   Heart disease Brother    Alzheimer's disease Sister    Diabetes Sister    Heart disease Brother    Diabetes Brother    Heart disease Brother    Heart disease Brother    Heart disease Brother    Heart disease Brother    Heart disease Brother    Alzheimer's disease Brother    Diabetes Son    Anesthesia problems Neg Hx     Social History   Socioeconomic History   Marital status: Married    Spouse name: [name]   Number of children: 2   Years of education: GED   Highest education level: GED or equivalent  Occupational History   Occupation: Retired     Comment: Secretary/administrator   Occupation: Retired     Comment: [employer]  Tobacco Use   Smoking  status: Former    Packs/day: 1.50    Years: 25.00    Pack years: 37.50    Types: Cigarettes    Start date: 12/12/1962    Quit date: 01/11/1990    Years since quitting: 31.0   Smokeless tobacco: Never  Vaping Use   Vaping Use: Never used  Substance and Sexual Activity   Alcohol use: No    Alcohol/week: 0.0 standard drinks   Drug use: No   Sexual activity: Not Currently  Other Topics Concern   Not on file  Social History Narrative   Right handed   Decaf coffee (2-3 per day)   Lives with wife   Social Determinants of Health   Financial Resource Strain: Not on file  Food Insecurity: Not on file  Transportation Needs: Not on file  Physical Activity: Not on file  Stress: Not on file  Social Connections: Not on file  Intimate Partner Violence: Not on file    Outpatient Medications Prior to Visit  Medication Sig Dispense Refill   apixaban (ELIQUIS) 5 MG TABS tablet TAKE 1 TABLET TWICE A DAY 180 tablet 1   brimonidine (ALPHAGAN) 0.2 % ophthalmic solution Place 1 drop into both eyes 2 (two) times daily.      cholecalciferol (VITAMIN D3) 25 MCG (1000 UNIT) tablet Take 1,000 Units by mouth daily.     flecainide (TAMBOCOR) 50 MG tablet Take 1 tablet (50 mg total) by mouth 2 (two) times daily. 180 tablet 3   Lifitegrast (XIIDRA) 5 % SOLN      LUMIGAN 0.01 % SOLN Place 1 drop into both eyes daily.     meclizine (ANTIVERT) 25 MG tablet Take 1 tablet (25 mg total) by mouth 3 (three) times daily as needed for dizziness. 30 tablet 1   metoprolol succinate (TOPROL-XL) 25 MG 24 hr tablet TAKE ONE-HALF (1/2) TABLET DAILY 45 tablet 3   Multiple Vitamin (MULTIVITAMIN WITH MINERALS) TABS tablet Take 1 tablet by mouth daily.      omeprazole (PRILOSEC) 20 MG capsule Take 1 capsule (20 mg total) by mouth daily. 90 capsule 3   Probiotic Product (PROBIOTIC DAILY PO) Take 1 capsule by mouth daily.      rosuvastatin (CRESTOR) 20 MG tablet Take 1 tablet (20 mg total) by mouth daily. 90 tablet 3    terazosin (HYTRIN) 2 MG capsule TAKE 1 CAPSULE AT BEDTIME 90 capsule 0   acyclovir (ZOVIRAX) 400 MG tablet TAKE 1 TABLET TWICE A  DAY 180 tablet 3   vitamin E 1000 UNIT capsule Take 1,000 Units by mouth daily.     No facility-administered medications prior to visit.      Review of Systems  Constitutional:  Positive for fever.  HENT:  Positive for congestion. Negative for ear pain and sore throat.   Respiratory:  Positive for cough and shortness of breath.   Cardiovascular:  Negative for chest pain.  Gastrointestinal:  Negative for abdominal pain.  Neurological:  Positive for headaches.  All other systems reviewed and are negative.     Objective:    Physical Exam Vitals and nursing note reviewed.  Constitutional:      Appearance: He is well-developed.  HENT:     Head: Normocephalic.     Right Ear: External ear normal.     Left Ear: External ear normal.     Nose: Congestion present.     Mouth/Throat:     Mouth: Mucous membranes are moist.  Eyes:     Conjunctiva/sclera: Conjunctivae normal.  Cardiovascular:     Rate and Rhythm: Normal rate and regular rhythm.     Pulses: Normal pulses.     Heart sounds: Normal heart sounds.  Pulmonary:     Effort: Pulmonary effort is normal.     Breath sounds: Normal breath sounds.  Chest:     Chest wall: No tenderness.  Abdominal:     General: Bowel sounds are normal.  Musculoskeletal:        General: Normal range of motion.  Skin:    General: Skin is warm.  Neurological:     Mental Status: He is alert and oriented to person, place, and time.  Psychiatric:        Mood and Affect: Mood normal.        Behavior: Behavior normal.    BP 105/62    Pulse 73    Temp 97.8 F (36.6 C) (Temporal)    Ht 6' (1.829 m)    Wt 193 lb 4 oz (87.7 kg)    SpO2 97%    BMI 26.21 kg/m  Wt Readings from Last 3 Encounters:  01/26/21 193 lb 4 oz (87.7 kg)  01/20/21 193 lb 3.2 oz (87.6 kg)  12/08/20 190 lb (86.2 kg)    Health Maintenance Due  Topic  Date Due   HEMOGLOBIN A1C  10/01/2020    There are no preventive care reminders to display for this patient.   Lab Results  Component Value Date   TSH 1.720 09/30/2020   Lab Results  Component Value Date   WBC 2.8 (L) 01/20/2021   HGB 15.7 01/20/2021   HCT 45.3 01/20/2021   MCV 86.1 01/20/2021   PLT 170 01/20/2021   Lab Results  Component Value Date   NA 138 01/20/2021   K 4.5 01/20/2021   CHLORIDE 106 04/14/2016   CO2 27 01/20/2021   GLUCOSE 99 01/20/2021   BUN 12 01/20/2021   CREATININE 1.15 01/20/2021   BILITOT 1.0 01/20/2021   ALKPHOS 49 01/20/2021   AST 19 01/20/2021   ALT 18 01/20/2021   PROT 6.5 01/20/2021   ALBUMIN 4.3 01/20/2021   CALCIUM 9.4 01/20/2021   ANIONGAP 8 01/20/2021   EGFR 74 09/30/2020   Lab Results  Component Value Date   CHOL 109 09/30/2020   Lab Results  Component Value Date   HDL 49 09/30/2020   Lab Results  Component Value Date   LDLCALC 38 09/30/2020   Lab Results  Component Value  Date   TRIG 128 09/30/2020   Lab Results  Component Value Date   CHOLHDL 2.2 09/30/2020   Lab Results  Component Value Date   HGBA1C 5.7 03/31/2020       Assessment & Plan:  Take meds as prescribed - Use a cool mist humidifier  -Use saline nose sprays frequently -Force fluids -For fever or aches or pains- take Tylenol or ibuprofen. -Flu swab negative -COVID-19 swab completed results pending. -Chest x-ray to rule out pneumonia-results pending.  Follow up with worsening unresolved symptoms  Problem List Items Addressed This Visit   None Visit Diagnoses     Acute cough    -  Primary   Relevant Orders   Veritor Flu A/B Waived   Novel Coronavirus, NAA ([facility])   Fever, unspecified fever cause       Relevant Orders   Veritor Flu A/B Waived   Novel Coronavirus, NAA ([facility])   SOB (shortness of breath)       Relevant Orders   Veritor Flu A/B Waived   Novel Coronavirus, NAA ([facility])   DG Chest 2 View        No orders of the  defined types were placed in this encounter.    Ivy Lynn, NP

## 2021-01-26 NOTE — Telephone Encounter (Signed)
Returned call to [place], she left x 2 calls to call her back. Saturday [name] started having chills, low grade fever in 100.4, chills, slight cough and profusely sweating at times. Covid test negative Saturday. He is very shot of breath and last night he started having pain across his chest, per wife.  She is is concerned due to his white count recently being low. They have been at home and not around anyone. She is asking if you can give him a Rx? Or what do you suggest?

## 2021-01-26 NOTE — Telephone Encounter (Signed)
Do we have any idea of the heart rate and bp trends. 109/57 bp would actually be ok, has he seen any bps where the top number is in the 90s conistenty? Was the elevated heart rate over 100 a one time or infrequent occurrence?   Zandra Abts MD

## 2021-01-26 NOTE — Telephone Encounter (Signed)
I recommend local physician/urgent care/ER evaluation, especially with chest pain He could have pneumonia or flu, there is not a single antibiotics that would take care of it without evaluation

## 2021-01-27 ENCOUNTER — Other Ambulatory Visit (HOSPITAL_COMMUNITY): Payer: Medicare Other

## 2021-01-27 LAB — NOVEL CORONAVIRUS, NAA: SARS-CoV-2, NAA: NOT DETECTED

## 2021-01-27 LAB — SARS-COV-2, NAA 2 DAY TAT

## 2021-01-28 ENCOUNTER — Other Ambulatory Visit: Payer: Self-pay

## 2021-01-28 ENCOUNTER — Ambulatory Visit (HOSPITAL_COMMUNITY)
Admission: RE | Admit: 2021-01-28 | Discharge: 2021-01-28 | Disposition: A | Discharge status: Home / Self Care | Payer: Medicare Other | Source: Ambulatory Visit | Attending: Hematology and Oncology | Admitting: Hematology and Oncology

## 2021-01-28 ENCOUNTER — Encounter: Payer: Self-pay | Admitting: Hematology and Oncology

## 2021-01-28 DIAGNOSIS — C8280 Other types of follicular lymphoma, unspecified site: Secondary | ICD-10-CM | POA: Insufficient documentation

## 2021-01-28 DIAGNOSIS — J479 Bronchiectasis, uncomplicated: Secondary | ICD-10-CM | POA: Diagnosis not present

## 2021-01-28 DIAGNOSIS — C8290 Follicular lymphoma, unspecified, unspecified site: Secondary | ICD-10-CM | POA: Diagnosis not present

## 2021-01-28 DIAGNOSIS — J439 Emphysema, unspecified: Secondary | ICD-10-CM | POA: Diagnosis not present

## 2021-01-28 DIAGNOSIS — K5730 Diverticulosis of large intestine without perforation or abscess without bleeding: Secondary | ICD-10-CM | POA: Diagnosis not present

## 2021-01-28 DIAGNOSIS — R590 Localized enlarged lymph nodes: Secondary | ICD-10-CM | POA: Diagnosis not present

## 2021-01-28 DIAGNOSIS — N281 Cyst of kidney, acquired: Secondary | ICD-10-CM | POA: Diagnosis not present

## 2021-01-28 DIAGNOSIS — I2510 Atherosclerotic heart disease of native coronary artery without angina pectoris: Secondary | ICD-10-CM | POA: Diagnosis not present

## 2021-01-28 DIAGNOSIS — K449 Diaphragmatic hernia without obstruction or gangrene: Secondary | ICD-10-CM | POA: Diagnosis not present

## 2021-01-28 IMAGING — CT CT CHEST-ABD-PELV W/ CM
2 of 5 series · 13 of 36 positions shown, 15 images · IV contrast (APPLIED)
Comparison: CT chest, abdomen and pelvis [DATE]

CLINICAL DATA: Follicular lymphoma, follow-up

EXAM:
CT CHEST, ABDOMEN, AND PELVIS WITH CONTRAST
TECHNIQUE: Multidetector CT imaging of the chest, abdomen and pelvis was
performed following the standard protocol during bolus
administration of intravenous contrast.

[Series 2: cap with · axial · 0.87mm/px · z∈[-692,-147]mm · 10 of 135 slices shown, 12 images]
[im 13/135  mediastinal]
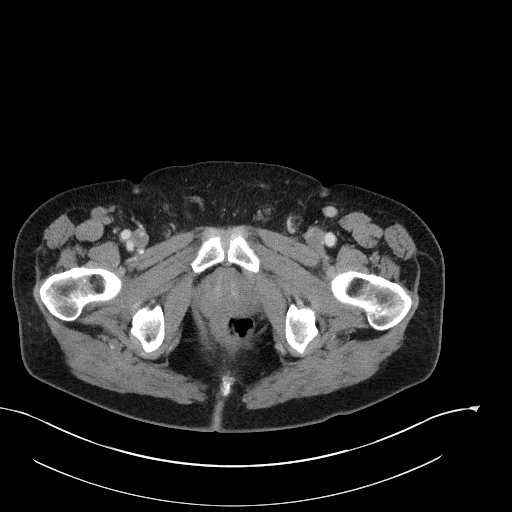
[im 13/135  bone]
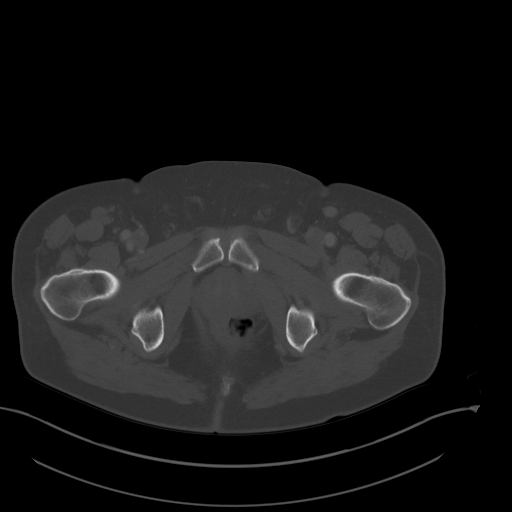
[im 25/135  mediastinal]
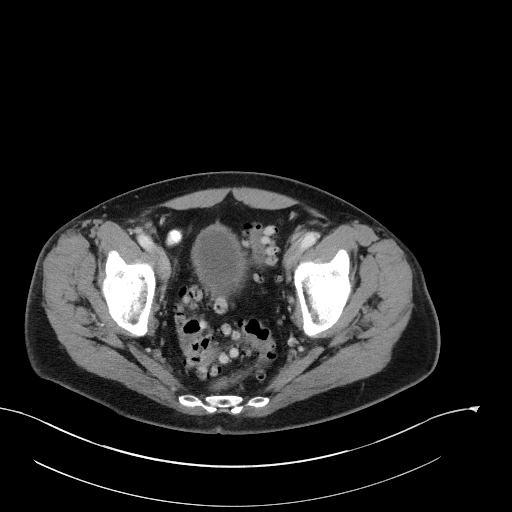
[im 37/135  mediastinal]
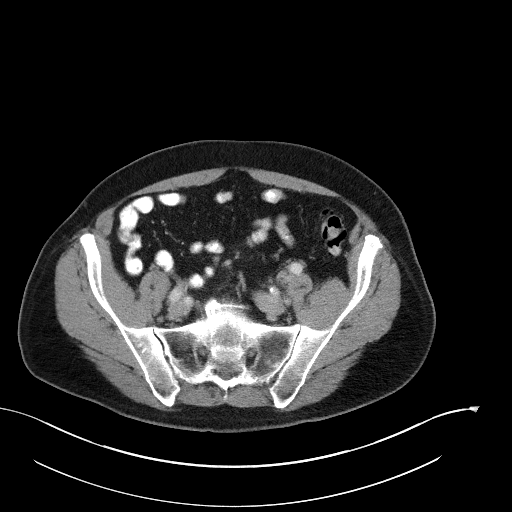
[im 49/135  mediastinal]
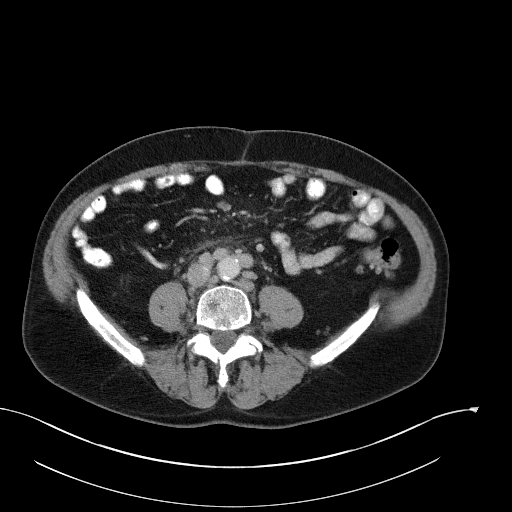
[im 61/135  mediastinal]
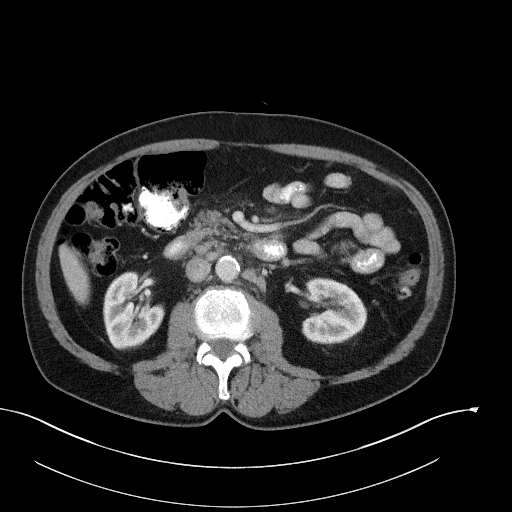
[im 74/135  mediastinal]
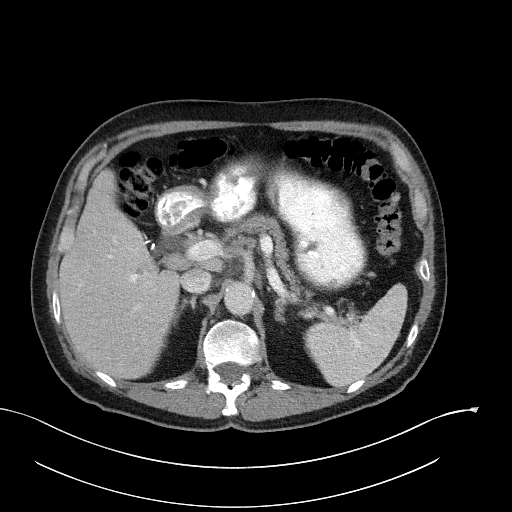
[im 86/135  mediastinal]
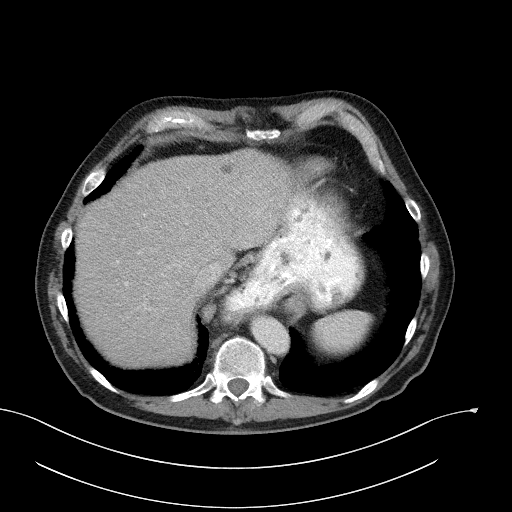
[im 98/135  mediastinal]
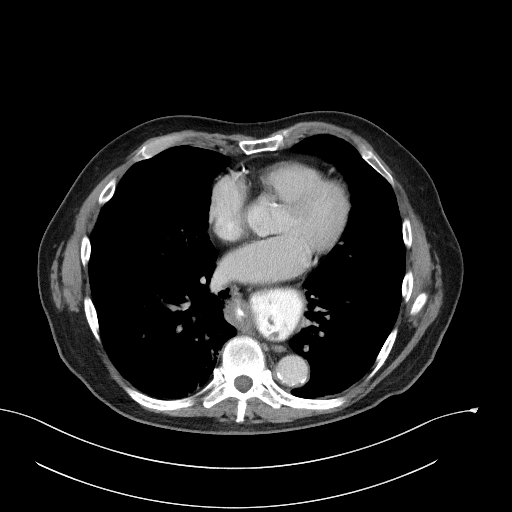
[im 110/135  mediastinal]
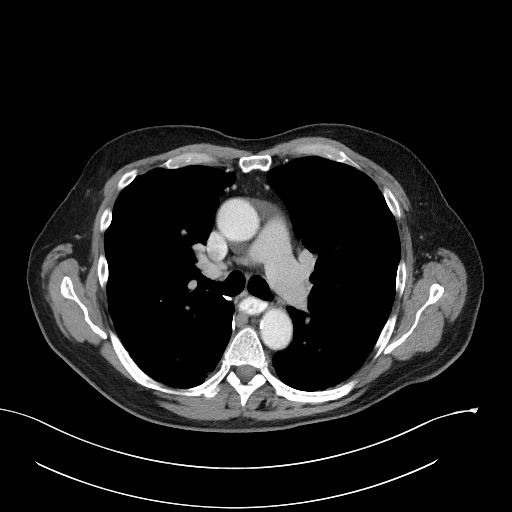
[im 110/135  bone]
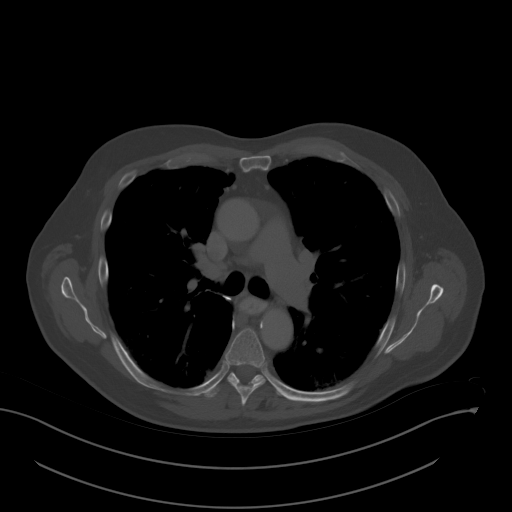
[im 122/135  mediastinal]
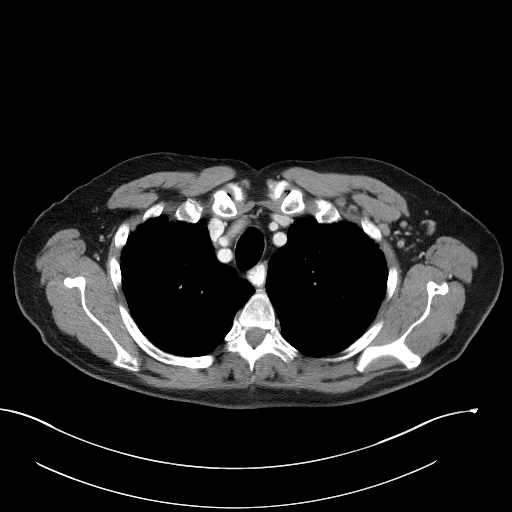

[Series 5: coronals · coronal · 0.82mm/px · 3 of 144 slices shown]
[im 29/144  mediastinal]
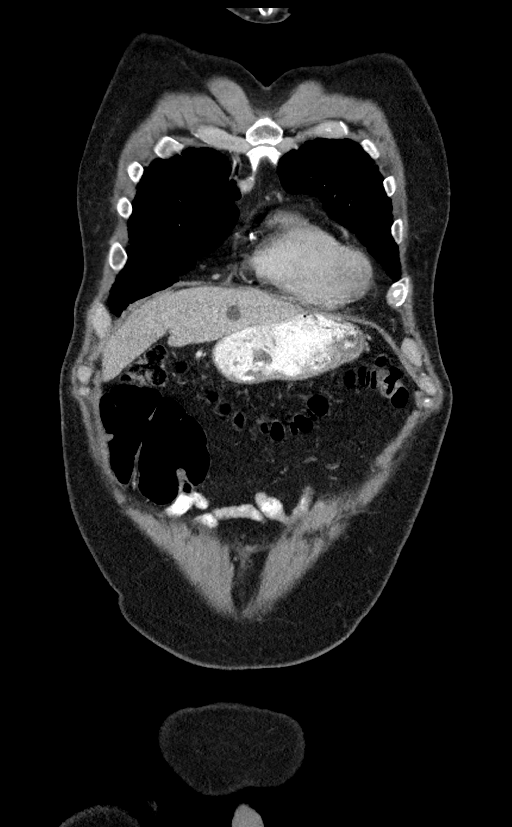
[im 58/144  mediastinal]
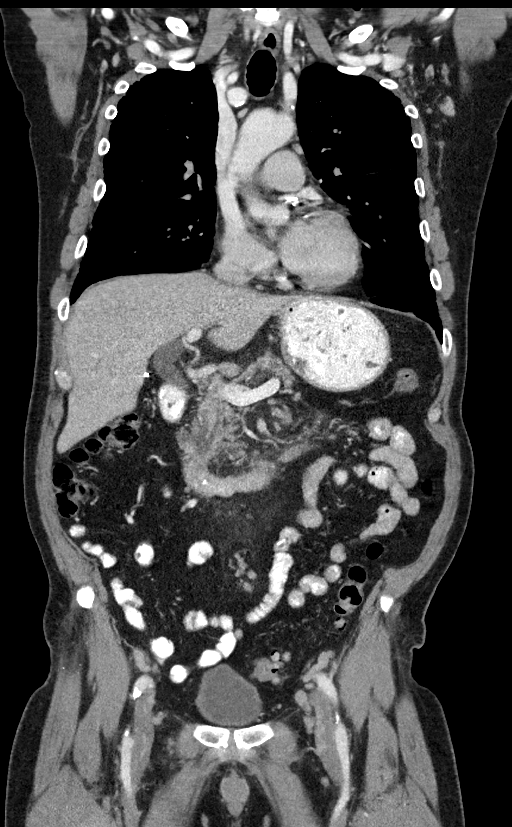
[im 86/144  mediastinal]
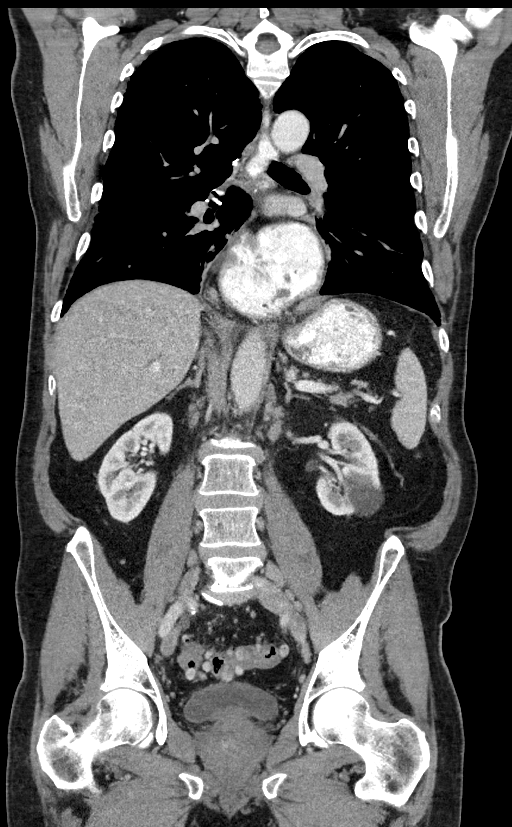

[13 of 36 positions shown; findings below may reference images not displayed]

RADIATION DOSE REDUCTION: This exam was performed according to the
departmental dose-optimization program which includes automated
exposure control, adjustment of the mA and/or kV according to
patient size and/or use of iterative reconstruction technique.

CONTRAST:  100mL OMNIPAQUE IOHEXOL 300 MG/ML  SOLN
FINDINGS: CT CHEST FINDINGS

Cardiovascular: Heart size is normal. No pericardial effusion
identified. Extensive coronary artery calcifications. Main pulmonary
artery is normal caliber. Thoracic aorta is tortuous and normal
caliber with mild-to-moderate atherosclerotic plaques.

Mediastinum/Nodes: No bulky axillary, mediastinal or hilar
lymphadenopathy identified.

Lungs/Pleura: Lungs are hyperinflated with moderate emphysematous
changes. Stable surgical changes from wedge resection in the right
lung. Stable biapical pleural thickening. Mild bronchiectasis. No
new or enlarged pulmonary nodule identified. No pleural effusion or
pneumothorax.

Musculoskeletal: No suspicious bony lesions identified in the chest.

CT ABDOMEN PELVIS FINDINGS

Hepatobiliary: Liver is normal in size and contour. Stable hypodense
likely cyst in the left lobe measuring 13 mm. Gallbladder is
surgically absent. No significant biliary ductal dilatation
identified.

Pancreas: Unremarkable. No pancreatic ductal dilatation or
surrounding inflammatory changes.

Spleen: Normal in size without focal abnormality.

Adrenals/Urinary Tract: Adrenal glands are normal. A few simple
appearing left renal cortical cysts again seen measuring up to
cm in the lower pole. No hydronephrosis or enhancing renal mass
identified bilaterally. Urinary bladder appears within normal
limits.

Stomach/Bowel: Large hiatal hernia. No bowel obstruction, free air
or pneumatosis. Extensive colonic diverticulosis. No bowel wall
edema identified. No evidence of acute appendicitis.

Vascular/Lymphatic: Numerous mildly enlarged upper abdominal and
para-aortic retroperitoneal lymph nodes are visualized. The nodes
are all either stable or mildly enlarged since previous study,
including mild enlargement of a portacaval lymph node measuring 11
mm in short axis, a retroperitoneal lymph node just anterior to the
left adrenal gland now measuring 12 mm in short axis and slightly
enlarged distal periaortic lymph nodes measuring up to 13 mm at the
aortic bifurcation. Interval increased mesenteric fat stranding
along the root of the mesentery with accompanying lymph nodes which
are also slightly increased in size since previous study measuring
up to 9.5 mm in short axis. Mild interval enlargement of bilateral
inguinal lymph nodes measuring up to 11 mm in short axis on the
right and 10 mm on the left.

Reproductive: Prostate gland is enlarged with nodular protrusion
into the base of the urinary bladder.

Other: No ascites

Musculoskeletal: Degenerative changes in the lumbar spine. No
suspicious bony lesions identified.
IMPRESSION: 1. Overall mild interval enlargement of lymph nodes in the abdomen
and pelvis as described.
2. No lymphadenopathy identified in the chest.
3. Emphysematous changes of the lungs.
4. Large hiatal hernia.  Extensive colonic diverticulosis.
5. Other ancillary findings as described.

## 2021-01-28 MED ORDER — IOHEXOL 300 MG/ML  SOLN
100.0000 mL | Freq: Once | INTRAMUSCULAR | Status: AC | PRN
  Administered 2021-01-28: 100 mL via INTRAVENOUS

## 2021-01-29 ENCOUNTER — Telehealth: Payer: Self-pay | Admitting: Internal Medicine

## 2021-01-29 NOTE — Telephone Encounter (Signed)
Patients wife calling to get possibly get antibiotic for Diverticulitis.

## 2021-01-30 ENCOUNTER — Encounter: Payer: Self-pay | Admitting: Hematology and Oncology

## 2021-01-30 ENCOUNTER — Other Ambulatory Visit: Payer: Self-pay

## 2021-01-30 ENCOUNTER — Telehealth (HOSPITAL_BASED_OUTPATIENT_CLINIC_OR_DEPARTMENT_OTHER): Payer: Medicare Other | Admitting: Hematology and Oncology

## 2021-01-30 ENCOUNTER — Ambulatory Visit: Payer: Medicare Other | Admitting: Hematology and Oncology

## 2021-01-30 ENCOUNTER — Other Ambulatory Visit: Payer: Medicare Other

## 2021-01-30 DIAGNOSIS — J209 Acute bronchitis, unspecified: Secondary | ICD-10-CM | POA: Diagnosis not present

## 2021-01-30 DIAGNOSIS — C8280 Other types of follicular lymphoma, unspecified site: Secondary | ICD-10-CM

## 2021-01-30 DIAGNOSIS — R0789 Other chest pain: Secondary | ICD-10-CM | POA: Diagnosis not present

## 2021-01-30 MED ORDER — PREDNISONE 20 MG PO TABS: 60.0000 mg | ORAL_TABLET | Freq: Every day | ORAL | 0 refills | Status: DC

## 2021-01-30 NOTE — Telephone Encounter (Signed)
Pt wife [name]  states that pt has been having nausea and abdominal pain ONLY in the mornings. [name] states that the pt recently stopped a cancer med two weeks ago and since then the pt symptoms have gotten better. [name] states that pt did not have and Nausea yesterday or today.  [name] was notified that this does NOT seem to be diverticulitis and does not need an antibiotic at this time. [name] notified if pt worsened to definitely call us back and notify us: [name] verbalized understanding with all questions answered.

## 2021-02-01 ENCOUNTER — Encounter: Payer: Self-pay | Admitting: Hematology and Oncology

## 2021-02-01 DIAGNOSIS — J209 Acute bronchitis, unspecified: Secondary | ICD-10-CM | POA: Insufficient documentation

## 2021-02-01 DIAGNOSIS — R0789 Other chest pain: Secondary | ICD-10-CM | POA: Insufficient documentation

## 2021-02-01 NOTE — Assessment & Plan Note (Signed)
He had recent atypical chest pain He has large hiatal hernia that could cause atypical chest pain  If his symptoms do not subside, I would recommend follow-up with cardiologist

## 2021-02-01 NOTE — Progress Notes (Signed)
HEMATOLOGY-ONCOLOGY ELECTRONIC VISIT PROGRESS NOTE  Patient Care Team: Dettinger, [name], MD as PCP - General (Family Medicine) Branch, Alphonse Guild, MD as PCP - Cardiology (Cardiology) Thompson Grayer, MD as PCP - Electrophysiology (Cardiology) [name], [name], MD (Inactive) as Consulting Physician (Cardiology) [name], [name], MD as Consulting Physician (Dermatology)  I connected with the patient via telephone conference and verified that I am speaking with the correct person using two identifiers. The patient's location is at home and I am providing care from the [facility] I discussed the limitations, risks, security and privacy concerns of performing an evaluation and management service by e-visits and the availability of in person appointments.  I also discussed with the patient that there may be a patient responsible charge related to this service. The patient expressed understanding and agreed to proceed.   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[place]) I have personally reviewed his scan His labs are normal His recent symptoms of chest pain and others are not related to CT findings He has diffuse small lymph nodes but they do not pose any immediate danger to his health We discussed the NCCN guidelines There is no indication to prescribe treatment now I recommend another visit in a few weeks after a course of prednisone therapy   Acute bronchitis He tested negative for flu and Covid He has cough and fever CT imaging showed findings suspicious for possible acute bronchitis on background of emphysema His symptoms are most consistent with mild COPD exacerbation I suggest a trial of prednisone for a week and reassess  Atypical chest pain He had recent atypical chest pain He has large hiatal hernia that could cause atypical  chest pain  If his symptoms do not subside, I would recommend follow-up with cardiologist  No orders of the defined types were placed in this encounter.   INTERVAL HISTORY: Please see below for problem oriented charting. 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   12/12/2003 - 06/01/2004 Chemotherapy   He was treated with R. CHOP chemotherapy which show complete remission. The number of cycles of R. CHOP chemotherapy was unknown.   12/19/2006 Surgery   Lung resection show follicular lymphoma.   01/02/2007 - 09/01/2008 Chemotherapy   The patient was treated with single agent rituximab alone.   01/19/2007 Bone Marrow Biopsy   Bone marrow biopsy was negative.   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   05/08/2013 - 05/11/2013 Hospital Admission   The patient was admitted to the hospital for management of pericarditis. CT scan showed extensive lymphadenopathy.   06/07/2013 Imaging   PET/CT scan showed extensive lymphadenopathy   06/25/2013 Procedure   He has placement of Infuse-a-Port.   06/28/2013 Bone Marrow Biopsy   Bone marrow biopsy is positive for lymphoma involvement.   07/04/2013 - 11/22/2013 Chemotherapy  He is treated with 6 cycles of bendamustine with rituximab.   09/24/2013 Imaging   Repeat PET scan show complete remission.   12/26/2013 Imaging   PEt scan showed complete remission   09/26/2014 Imaging   CT scan of the chest  abdomen and pelvis show no evidence of disease   03/26/2015 Imaging   CT scan showed no evidence of lymphoma   04/14/2016 Imaging   CT: Borderline prominent right hilar and subcarinal lymph nodes, but not appreciably changed. 2. Low-grade but increased central mesenteric stranding with some small mesenteric lymph nodes. This could certainly be inflammatory, 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, much of which is likely a physiologic response to cholecystectomy. 7. Sigmoid colon diverticulosis. 8.  Prominent stool throughout the colon favors constipation. 9. Enlarged prostate gland, volume estimated at 75 cubic cm.   02/15/2017 Imaging   No evidence of recurrent lymphoma or other acute findings.  Stable moderate hiatal hernia.  Colonic diverticulosis, without radiographic evidence of diverticulitis.  Stable mildly enlarged prostate.  Mild emphysema.  Aortic and coronary artery atherosclerosis.   01/31/2019 Imaging   1. Interval development of abdominal and pelvic adenopathy compatible with recurrent lymphoma. 2. Emphysema and aortic atherosclerosis. 3. Multi vessel coronary artery calcifications. 4. Moderate to large hiatal hernia   02/09/2019 Procedure   Successful CT-guided core biopsy of the left retroperitoneal periaortic adenopathy   02/09/2019 Pathology Results   SURGICAL PATHOLOGY  CASE: [id number]  PATIENT: Anthony Kelly  Surgical Pathology Report   Clinical History: Lymphoma; [name], LEFT PARA AORTIC, NEEDLE CORE BIOPSY:  -  Follicular lymphoma  -  See comment   COMMENT:   The  biopsy consists of four fragmented lymph node cores with a vaguely nodular proliferation pattern.  The lymphoid population is composed of small to medium lymphocytes with irregular, cleaved nuclei and scant cytoplasm.  By immunohistochemistry, [name], [name], [name], [name] is low (less than 10%).  Flow cytometry was attempted; however, there was insufficient material for analysis (see [id number]).  Overall, the features are consistent with relapse of the patient's previously diagnosed follicular lymphoma.  Based on the biopsy, this is favored to be a low-grade follicular lymphoma   0/03/8880 -  Chemotherapy   The patient had idelisib for chemotherapy treatment.     05/17/2019 Imaging   1. Interval generalized decrease in abdominopelvic lymphadenopathy identified on the previous study. No new or progressive lymphadenopathy on today's study. 2. Moderate hiatal hernia. 3.  Emphysema (ICD10-J43.9) and Aortic Atherosclerosis (ICD10-170.0)   11/16/2019 Imaging   IMPRESSION: Chest Impression:   1. No mediastinal lymphadenopathy. 2. Post RIGHT lung wedge resection without evidence local recurrence.   Abdomen / Pelvis Impression:   1. Stable numerous small periaortic retroperitoneal nodes and common iliac nodes. No progression of adenopathy. 2. No splenomegaly.  No skeletal metastasis.     07/31/2020 Imaging   1. Stable examination. No significant interval change in the prominent/mildly enlarged lymph nodes below the diaphragm. No thoracic  adenopathy. No splenomegaly. 2. Postsurgical change of right lung wedge resection without evidence of local recurrence. 3. Large hiatal hernia. 4. Colonic diverticulosis without findings of acute diverticulitis. 5. Aortic Atherosclerosis (ICD10-I70.0)  and Emphysema (ICD10-J43.9).     01/29/2021 Imaging   1. Overall mild interval enlargement of lymph nodes in the abdomen and pelvis as described. 2. No lymphadenopathy identified in the chest. 3. Emphysematous changes of the lungs. 4. Large hiatal hernia.  Extensive colonic diverticulosis. 5. Other ancillary findings as described.       REVIEW OF SYSTEMS:   Eyes: Denies blurriness of vision Ears, nose, mouth, throat, and face: Denies mucositis or sore throat Cardiovascular: Denies palpitation, chest discomfort Gastrointestinal:  Denies nausea, heartburn or change in bowel habits Skin: Denies abnormal skin rashes Lymphatics: Denies new lymphadenopathy or easy bruising Neurological:Denies numbness, tingling or new weaknesses Behavioral/Psych: Mood is stable, no new changes  Extremities: No lower extremity edema All other systems were reviewed with the patient and are negative.  I have reviewed the past medical history, past surgical history, social history and family history with the patient and they are unchanged from previous note.  ALLERGIES:  has No Known Allergies.  MEDICATIONS:  Current Outpatient Medications  Medication Sig Dispense Refill   predniSONE (DELTASONE) 20 MG tablet Take 3 tablets (60 mg total) by mouth daily with breakfast. 21 tablet 0   apixaban (ELIQUIS) 5 MG TABS tablet TAKE 1 TABLET TWICE A DAY 180 tablet 1   brimonidine (ALPHAGAN) 0.2 % ophthalmic solution Place 1 drop into both eyes 2 (two) times daily.      cholecalciferol (VITAMIN D3) 25 MCG (1000 UNIT) tablet Take 1,000 Units by mouth daily.     flecainide (TAMBOCOR) 50 MG tablet Take 1 tablet (50 mg total) by mouth 2 (two) times daily. 180 tablet 3   Lifitegrast (XIIDRA) 5 % SOLN      LUMIGAN 0.01 % SOLN Place 1 drop into both eyes daily.     meclizine (ANTIVERT) 25 MG tablet Take 1 tablet (25 mg total) by mouth 3 (three) times daily as needed for dizziness. 30 tablet 1   metoprolol succinate  (TOPROL-XL) 25 MG 24 hr tablet TAKE ONE-HALF (1/2) TABLET DAILY 45 tablet 3   Multiple Vitamin (MULTIVITAMIN WITH MINERALS) TABS tablet Take 1 tablet by mouth daily.      omeprazole (PRILOSEC) 20 MG capsule Take 1 capsule (20 mg total) by mouth daily. 90 capsule 3   Probiotic Product (PROBIOTIC DAILY PO) Take 1 capsule by mouth daily.      rosuvastatin (CRESTOR) 20 MG tablet Take 1 tablet (20 mg total) by mouth daily. 90 tablet 3   terazosin (HYTRIN) 2 MG capsule TAKE 1 CAPSULE AT BEDTIME 90 capsule 0   No current facility-administered medications for this visit.    PHYSICAL EXAMINATION: ECOG PERFORMANCE STATUS: 1 - Symptomatic but completely ambulatory  LABORATORY DATA:  I have reviewed the data as listed CMP Latest Ref Rng & Units 01/20/2021 10/31/2020 09/30/2020  Glucose 70 - 99 mg/dL 99 129(H) 119(H)  BUN 8 - 23 mg/dL _0 Creatinine 0.61 - 1.24 mg/dL 1.15 1.00 1.05  Sodium 135 - 145 mmol/L 138 137 142  Potassium 3.5 - 5.1 mmol/L 4.5 4.5 4.4  Chloride 98 - 111 mmol/L 103 105 104  CO2 22 - 32 mmol/L _1 Calcium 8.9 - 10.3 mg/dL 9.4 9.2 9.3  Total Protein 6.5 - 8.1 g/dL 6.5 6.9 6.2  Total Bilirubin 0.3 -  1.2 mg/dL 1.0 0.6 0.6  Alkaline Phos 38 - 126 U/L 49 51 55  AST 15 - 41 U/L _0 ALT 0 - 44 U/L _1 Lab Results  Component Value Date   WBC 2.8 (L) 01/20/2021   HGB 15.7 01/20/2021   HCT 45.3 01/20/2021   MCV 86.1 01/20/2021   PLT 170 01/20/2021   NEUTROABS 0.2 (LL) 01/20/2021     RADIOGRAPHIC STUDIES: I have personally reviewed the radiological images as listed and agreed with the findings in the report. DG Chest 2 View  Result Date: 01/26/2021 CLINICAL DATA:  Wheezing. EXAM: CHEST - 2 VIEW COMPARISON:  Chest x-ray dated March 28, 2019. FINDINGS: Normal heart size. Unchanged large hiatal hernia. Similar postsurgical changes near the right hilum. No focal consolidation, pleural effusion, or pneumothorax. Unchanged mild scarring in the medial right  lower lobe. No acute osseous abnormality. IMPRESSION: 1. No acute cardiopulmonary disease. Electronically Signed   By: Titus Dubin M.D.   On: 01/26/2021 15:55   CT CHEST ABDOMEN PELVIS W CONTRAST  Result Date: 01/29/2021 CLINICAL DATA:  Follicular lymphoma, follow-up EXAM: CT CHEST, ABDOMEN, AND PELVIS WITH CONTRAST TECHNIQUE: Multidetector CT imaging of the chest, abdomen and pelvis was performed following the standard protocol during bolus administration of intravenous contrast. RADIATION DOSE REDUCTION: This exam was performed according to the departmental dose-optimization program which includes automated exposure control, adjustment of the mA and/or kV according to patient size and/or use of iterative reconstruction technique. CONTRAST:  179m OMNIPAQUE IOHEXOL 300 MG/ML  SOLN COMPARISON:  CT chest, abdomen and pelvis 07/30/2020 FINDINGS: CT CHEST FINDINGS Cardiovascular: Heart size is normal. No pericardial effusion identified. Extensive coronary artery calcifications. Main pulmonary artery is normal caliber. Thoracic aorta is tortuous and normal caliber with mild-to-moderate atherosclerotic plaques. Mediastinum/Nodes: No bulky axillary, mediastinal or hilar lymphadenopathy identified. Lungs/Pleura: Lungs are hyperinflated with moderate emphysematous changes. Stable surgical changes from wedge resection in the right lung. Stable biapical pleural thickening. Mild bronchiectasis. No new or enlarged pulmonary nodule identified. No pleural effusion or pneumothorax. Musculoskeletal: No suspicious bony lesions identified in the chest. CT ABDOMEN PELVIS FINDINGS Hepatobiliary: Liver is normal in size and contour. Stable hypodense likely cyst in the left lobe measuring 13 mm. Gallbladder is surgically absent. No significant biliary ductal dilatation identified. Pancreas: Unremarkable. No pancreatic ductal dilatation or surrounding inflammatory changes. Spleen: Normal in size without focal abnormality.  Adrenals/Urinary Tract: Adrenal glands are normal. A few simple appearing left renal cortical cysts again seen measuring up to 4.2 cm in the lower pole. No hydronephrosis or enhancing renal mass identified bilaterally. Urinary bladder appears within normal limits. Stomach/Bowel: Large hiatal hernia. No bowel obstruction, free air or pneumatosis. Extensive colonic diverticulosis. No bowel wall edema identified. No evidence of acute appendicitis. Vascular/Lymphatic: Numerous mildly enlarged upper abdominal and para-aortic retroperitoneal lymph nodes are visualized. The nodes are all either stable or mildly enlarged since previous study, including mild enlargement of a portacaval lymph node measuring 11 mm in short axis, a retroperitoneal lymph node just anterior to the left adrenal gland now measuring 12 mm in short axis and slightly enlarged distal periaortic lymph nodes measuring up to 13 mm at the aortic bifurcation. Interval increased mesenteric fat stranding along the root of the mesentery with accompanying lymph nodes which are also slightly increased in size since previous study measuring up to 9.5 mm in short axis. Mild interval enlargement of bilateral inguinal lymph nodes measuring up to 11 mm in short  axis on the right and 10 mm on the left. Reproductive: Prostate gland is enlarged with nodular protrusion into the base of the urinary bladder. Other: No ascites Musculoskeletal: Degenerative changes in the lumbar spine. No suspicious bony lesions identified. IMPRESSION: 1. Overall mild interval enlargement of lymph nodes in the abdomen and pelvis as described. 2. No lymphadenopathy identified in the chest. 3. Emphysematous changes of the lungs. 4. Large hiatal hernia.  Extensive colonic diverticulosis. 5. Other ancillary findings as described. Electronically Signed   By: Ofilia Neas M.D.   On: 01/29/2021 10:51    I discussed the assessment and treatment plan with the patient. The patient was provided  an opportunity to ask questions and all were answered. The patient agreed with the plan and demonstrated an understanding of the instructions. The patient was advised to call back or seek an in-person evaluation if the symptoms worsen or if the condition fails to improve as anticipated.    I spent 30 minutes for the appointment reviewing test results, discuss management and coordination of care.  [name], MD 02/01/2021 1:46 PM

## 2021-02-01 NOTE — Assessment & Plan Note (Signed)
I have personally reviewed his scan His labs are normal His recent symptoms of chest pain and others are not related to CT findings He has diffuse small lymph nodes but they do not pose any immediate danger to his health We discussed the NCCN guidelines There is no indication to prescribe treatment now I recommend another visit in a few weeks after a course of prednisone therapy

## 2021-02-01 NOTE — Assessment & Plan Note (Signed)
He tested negative for flu and Covid He has cough and fever CT imaging showed findings suspicious for possible acute bronchitis on background of emphysema His symptoms are most consistent with mild COPD exacerbation I suggest a trial of prednisone for a week and reassess

## 2021-02-02 ENCOUNTER — Encounter: Payer: Self-pay | Admitting: Hematology and Oncology

## 2021-02-09 ENCOUNTER — Encounter: Payer: Self-pay | Admitting: Hematology and Oncology

## 2021-02-16 ENCOUNTER — Ambulatory Visit (INDEPENDENT_AMBULATORY_CARE_PROVIDER_SITE_OTHER): Payer: Medicare Other | Admitting: Cardiology

## 2021-02-16 ENCOUNTER — Encounter: Payer: Self-pay | Admitting: Cardiology

## 2021-02-16 ENCOUNTER — Other Ambulatory Visit: Payer: Self-pay

## 2021-02-16 VITALS — BP 104/60 | HR 76 | Ht 72.0 in | Wt 192.2 lb

## 2021-02-16 DIAGNOSIS — I480 Paroxysmal atrial fibrillation: Secondary | ICD-10-CM | POA: Diagnosis not present

## 2021-02-16 DIAGNOSIS — I2510 Atherosclerotic heart disease of native coronary artery without angina pectoris: Secondary | ICD-10-CM | POA: Diagnosis not present

## 2021-02-16 NOTE — Patient Instructions (Signed)
Medication Instructions:  Continue all current medications.   Labwork: none  Testing/Procedures: none  Follow-Up: 6 months   Any Other Special Instructions Will Be Listed Below (If Applicable).   If you need a refill on your cardiac medications before your next appointment, please call your pharmacy.  

## 2021-02-16 NOTE — Progress Notes (Signed)
Clinical Summary Anthony Kelly is a 76 y.o.male seen today for follow up of the following medical problems.    1. PAF - followed by EP, started on flecanide    - no recent palpitations - compliant with meds - no bleeding on eliquis.      2. CAD - Coronary CT angiography demonstrated nonobstructive coronary artery disease in the LAD, left circumflex, and RCA. - 05/2019 cath: prox LAD 25%, ramus 50%, RCA 25% - 03/2019 TC 137 TG 75 HDL 61 LDL 61 - 09/2020 TC 109 TG 128 HDL 49 LDL 38   08/2020 echo LVEF 60-65%, no WMAs  - no recent chest pains.      3. NSVT -denies any symptoms     4. Lymphoma - followed by oncology   5. Double vision - transient episode, seen in ER 07/24/20 - MRI/MRA no acute process, no significant cerebrovascular disease - followed by optho, pcp     Past Medical History:  Diagnosis Date   Acute pericarditis    a. 04/2013 -adm with CP, elevated CRP. H/o coronary artery calcification on prior CT but nuc was negative, EF 71%.   Arthritis    Atrial fibrillation ([place])    a. Isolated episode in the setting of acute pericarditis 04/2013. Was not placed on anticoag.   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      cholecalciferol (VITAMIN D3) 25 MCG (1000 UNIT) tablet Take 1,000 Units by mouth daily.     flecainide (TAMBOCOR) 50 MG tablet Take 1 tablet (50 mg total) by mouth 2 (two) times daily. 180 tablet 3   Lifitegrast (XIIDRA) 5 % SOLN      LUMIGAN 0.01 % SOLN Place 1 drop into both eyes daily.     meclizine (ANTIVERT) 25 MG tablet Take 1 tablet (25 mg total) by mouth 3 (three) times daily as needed for dizziness. 30 tablet 1   metoprolol succinate (TOPROL-XL) 25 MG 24 hr tablet TAKE ONE-HALF (1/2) TABLET DAILY 45 tablet 3   Multiple Vitamin (MULTIVITAMIN WITH MINERALS) TABS tablet Take 1 tablet by mouth daily.      omeprazole (PRILOSEC) 20 MG capsule Take 1 capsule (20 mg total) by mouth daily. 90 capsule 3   predniSONE (DELTASONE) 20 MG tablet Take 3 tablets (60 mg total) by mouth daily with breakfast. 21 tablet 0   Probiotic Product (PROBIOTIC DAILY PO) Take 1 capsule by mouth daily.      rosuvastatin (CRESTOR)  20 MG tablet Take 1 tablet (20 mg total) by mouth daily. 90 tablet 3   terazosin (HYTRIN) 2 MG capsule TAKE 1 CAPSULE AT BEDTIME 90 capsule 0   No current facility-administered medications for this visit.     Past Surgical History:  Procedure Laterality Date   CHOLECYSTECTOMY     EXPLORATORY LAPAROTOMY     EYE SURGERY Right 2016   cataract   LEFT HEART CATH AND CORONARY ANGIOGRAPHY N/A 06/05/2019   Procedure: LEFT HEART CATH AND CORONARY ANGIOGRAPHY;  Surgeon: Jettie Booze, MD;  Location: [facility];  Service: Cardiovascular;  Laterality: N/A;   LUNG LOBECTOMY     right side   LYMPH NODE BIOPSY     in groin with removal   MENISCUS REPAIR     right knee   MOHS SURGERY Left 09/2020   cheek   PROSTATE SURGERY     REFRACTIVE SURGERY Right    piece of metal removed   SUBMANDIBULAR  GLAND EXCISION  04/2011   SUBMANDIBULAR GLAND EXCISION  04/30/2011   Procedure: EXCISION SUBMANDIBULAR GLAND;  Surgeon: Jerrell Belfast, MD;  Location: [facility];  Service: ENT;  Laterality: Left;  WITH DIAGNOSTIC BIOPSY   TONSILLECTOMY     as a child   VEIN LIGATION AND STRIPPING     right leg     No Known Allergies    Family History  Problem Relation Age of Onset   Heart disease Father    Heart attack Father        x 3   Cancer Sister        breast ca   Cancer Brother        prostate ca   Heart attack Sister    Cancer Sister        breast   Heart disease Brother    Alzheimer's disease Sister    Diabetes Sister    Heart disease Brother    Diabetes Brother    Heart disease Brother    Heart disease Brother    Heart disease Brother    Heart disease Brother    Heart disease Brother    Alzheimer's disease Brother    Diabetes Son    Anesthesia problems Neg Hx      Social History Anthony Kelly reports that he quit smoking about 31 years ago. His smoking use included cigarettes. He started smoking about 58 years ago. He has a 37.50 pack-year smoking history. He has never used smokeless tobacco. Anthony Kelly reports no history of alcohol use.   Review of Systems CONSTITUTIONAL: No weight loss, fever, chills, weakness or fatigue.  HEENT: Eyes: No visual loss, blurred vision, double vision or yellow sclerae.No hearing loss, sneezing, congestion, runny nose or sore throat.  SKIN: No rash or itching.  CARDIOVASCULAR: per hpi RESPIRATORY: No shortness of breath, cough or sputum.  GASTROINTESTINAL: No anorexia, nausea, vomiting or diarrhea. No abdominal pain or blood.  GENITOURINARY: No burning on urination, no polyuria NEUROLOGICAL: No headache, dizziness, syncope, paralysis, ataxia, numbness or tingling in the extremities. No change in bowel or bladder control.  MUSCULOSKELETAL: No muscle, back pain, joint pain or stiffness.  LYMPHATICS: No enlarged nodes. No history of  splenectomy.  PSYCHIATRIC: No history of depression or anxiety.  ENDOCRINOLOGIC: No reports of sweating, cold or heat intolerance. No polyuria or polydipsia.  Marland Kitchen   Physical Examination Today's Vitals   02/16/21 0913  BP: 104/60  Pulse: 76  SpO2: 96%  Weight: 192 lb 3.2  oz (87.2 kg)  Height: 6' (1.829 m)   Body mass index is 26.07 kg/m.  Gen: resting comfortably, no acute distress HEENT: no scleral icterus, pupils equal round and reactive, no palptable cervical adenopathy,  CV: RRR, no m/r/g, no jvd Resp: Clear to auscultation bilaterally GI: abdomen is soft, non-tender, non-distended, normal bowel sounds, no hepatosplenomegaly MSK: extremities are warm, no edema.  Skin: warm, no rash Neuro:  no focal deficits Psych: appropriate affect   Diagnostic Studies  Coronary CT angiography 2019/05/21:   1. Left Main:  No significant stenosis.   2. LAD: No significant stenosis.  FFR 0.9 proximal, 0.84 distal 3. LCX: No significant stenosis. FFR 0.9 in the mid vessel, 0.84 distally 4. RCA: No significant stenosis. FFR 0.9 proximally, 0.87 mid, 0.80 distally   IMPRESSION: 1.  CT FFR analysis didn't show any significant stenosis.     Echocardiogram 05/22/2019:   1. Left ventricular ejection fraction, by estimation, is 60 to 65%. The  left ventricle has normal function. The left ventricle has no regional  wall motion abnormalities. There is mild left ventricular hypertrophy.  Left ventricular diastolic parameters  are indeterminate.   2. Right ventricular systolic function is normal. The right ventricular  size is mildly enlarged. Mildly increased right ventricular wall  thickness. Tricuspid regurgitation signal is inadequate for assessing PA  pressure.   3. Left atrial size was mildly dilated.   4. The mitral valve is grossly normal. Trivial mitral valve  regurgitation.   5. The aortic valve is tricuspid. Aortic valve regurgitation is not  visualized.   6. The inferior  vena cava is normal in size with greater than 50%  respiratory variability, suggesting right atrial pressure of 3 mmHg.  08/2020 echo IMPRESSIONS     1. Left ventricular ejection fraction, by estimation, is 60 to 65%. The  left ventricle has normal function. The left ventricle has no regional  wall motion abnormalities. There is mild left ventricular hypertrophy.  Left ventricular diastolic parameters  were normal.   2. Right ventricular systolic function is normal. The right ventricular  size is normal. Tricuspid regurgitation signal is inadequate for assessing  PA pressure.   3. The mitral valve is normal in structure. No evidence of mitral valve  regurgitation. No evidence of mitral stenosis.   4. The aortic valve is tricuspid. Aortic valve regurgitation is not  visualized. No aortic stenosis is present.   5. The inferior vena cava is normal in size with greater than 50%  respiratory variability, suggesting right atrial pressure of 3 mmHg.    Assessment and Plan  1. PAF - in the absence of clinically significant CAD have continued flecanide - no symptoms, continue current meds   2. CAD - mild disease by cath -no symptoms, continue to monitor         F/u 6 months    Arnoldo Lenis, M.D

## 2021-02-17 ENCOUNTER — Telehealth: Payer: Self-pay

## 2021-02-17 ENCOUNTER — Encounter: Payer: Self-pay | Admitting: Hematology and Oncology

## 2021-02-17 ENCOUNTER — Inpatient Hospital Stay (HOSPITAL_BASED_OUTPATIENT_CLINIC_OR_DEPARTMENT_OTHER): Payer: Medicare Other | Admitting: Hematology and Oncology

## 2021-02-17 ENCOUNTER — Inpatient Hospital Stay: Payer: Medicare Other | Attending: Hematology and Oncology

## 2021-02-17 DIAGNOSIS — C8298 Follicular lymphoma, unspecified, lymph nodes of multiple sites: Secondary | ICD-10-CM | POA: Diagnosis not present

## 2021-02-17 DIAGNOSIS — D701 Agranulocytosis secondary to cancer chemotherapy: Secondary | ICD-10-CM | POA: Insufficient documentation

## 2021-02-17 DIAGNOSIS — J432 Centrilobular emphysema: Secondary | ICD-10-CM | POA: Diagnosis not present

## 2021-02-17 DIAGNOSIS — R21 Rash and other nonspecific skin eruption: Secondary | ICD-10-CM | POA: Diagnosis not present

## 2021-02-17 DIAGNOSIS — N400 Enlarged prostate without lower urinary tract symptoms: Secondary | ICD-10-CM

## 2021-02-17 DIAGNOSIS — C8280 Other types of follicular lymphoma, unspecified site: Secondary | ICD-10-CM

## 2021-02-17 DIAGNOSIS — K5730 Diverticulosis of large intestine without perforation or abscess without bleeding: Secondary | ICD-10-CM | POA: Diagnosis not present

## 2021-02-17 DIAGNOSIS — K449 Diaphragmatic hernia without obstruction or gangrene: Secondary | ICD-10-CM | POA: Diagnosis not present

## 2021-02-17 LAB — COMPREHENSIVE METABOLIC PANEL
ALT: 19 U/L (ref 0–44)
AST: 18 U/L (ref 15–41)
Albumin: 3.9 g/dL (ref 3.5–5.0)
Alkaline Phosphatase: 54 U/L (ref 38–126)
Anion gap: 6 (ref 5–15)
BUN: 15 mg/dL (ref 8–23)
CO2: 29 mmol/L (ref 22–32)
Calcium: 9.3 mg/dL (ref 8.9–10.3)
Chloride: 104 mmol/L (ref 98–111)
Creatinine, Ser: 1.05 mg/dL (ref 0.61–1.24)
GFR, Estimated: >60 mL/min (ref >60)
Glucose, Bld: 124 mg/dL — ABNORMAL HIGH (ref 70–99)
Potassium: 4.6 mmol/L (ref 3.5–5.1)
Sodium: 139 mmol/L (ref 135–145)
Total Bilirubin: 0.7 mg/dL (ref 0.3–1.2)
Total Protein: 6.1 g/dL — ABNORMAL LOW (ref 6.5–8.1)

## 2021-02-17 LAB — CBC WITH DIFFERENTIAL/PLATELET
Abs Immature Granulocytes: 0.02 10*3/uL (ref 0.00–0.07)
Basophils Absolute: 0.1 10*3/uL (ref 0.0–0.1)
Basophils Relative: 1 %
Eosinophils Absolute: 0.6 10*3/uL — ABNORMAL HIGH (ref 0.0–0.5)
Eosinophils Relative: 7 %
HCT: 45.6 % (ref 39.0–52.0)
Hemoglobin: 15.1 g/dL (ref 13.0–17.0)
Immature Granulocytes: 0 %
Lymphocytes Relative: 17 %
Lymphs Abs: 1.5 10*3/uL (ref 0.7–4.0)
MCH: 29.3 pg (ref 26.0–34.0)
MCHC: 33.1 g/dL (ref 30.0–36.0)
MCV: 88.5 fL (ref 80.0–100.0)
Monocytes Absolute: 0.8 10*3/uL (ref 0.1–1.0)
Monocytes Relative: 9 %
Neutro Abs: 5.5 10*3/uL (ref 1.7–7.7)
Neutrophils Relative %: 66 %
Platelets: 162 10*3/uL (ref 150–400)
RBC: 5.15 MIL/uL (ref 4.22–5.81)
RDW: 13.2 % (ref 11.5–15.5)
WBC: 8.4 10*3/uL (ref 4.0–10.5)
nRBC: 0 % (ref 0.0–0.2)

## 2021-02-17 NOTE — Progress Notes (Signed)
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[place]) I have reviewed multiple imaging studies with the patient and his wife His recent CT imaging showed small lymphadenopathy throughout but nothing suspicious for disease progression We discussed the current NCCN guidelines There is no indication for him to resume any form of treatment The patient is frail and had infection recently when he become neutropenic from chemotherapy The patient and his wife expressed concern and is not comfortable for watchful observation His wife inquired about CAR-T cell I recommend second opinion at [facility] for further discussion as well as the role of CAR-T cell I plan to see him again in 3 months for further follow-up we will repeat imaging study in 6 months  Benign prostate hyperplasia The patient have history of benign prostate hypertrophy I reviewed his recent CT imaging which confirmed enlarged prostate The patient is very symptomatic, routinely would wake up 5 times a night to urinate He request that urology consult which I think is appropriate and we will set up consult to [facility] closer to where he lives  Orders Placed This Encounter  Procedures   Ambulatory referral to Urology    Referral Priority:   Routine    Referral Type:   Consultation    Referral Reason:   Specialty Services Required    Referred to Provider:   Franchot Gallo, Kelly    Requested Specialty:    Urology    Number of Visits Requested:   1    All questions were answered. The patient knows to call the clinic with any problems, questions or concerns. The total time spent in the appointment was 30 minutes encounter with patients including review of chart and various tests results, discussions about plan of care and coordination of care plan   Anthony Kelly 02/17/2021 12:50 PM  INTERVAL HISTORY: Please see below for problem oriented charting. he returns for treatment follow-up He has significant skin rash on his face because he has been prescribed chemotherapy cream for basal cell carcinoma He denies further chest pain or shortness of breath He has completed his recent chemotherapy treatment and has discontinued idelalisib  REVIEW OF SYSTEMS:   Constitutional: Denies fevers, chills or abnormal weight loss Eyes: Denies blurriness of vision Ears, nose, mouth, throat, and face: Denies mucositis or sore throat Respiratory: Denies cough, dyspnea or wheezes Cardiovascular: Denies palpitation, chest discomfort or lower extremity swelling Gastrointestinal:  Denies nausea, heartburn or change in bowel habits Lymphatics: Denies new lymphadenopathy or easy bruising Neurological:Denies numbness, tingling or new weaknesses Behavioral/Psych: Mood is stable, no new changes  All other systems were reviewed with the patient and are negative.  I have reviewed the past medical history, past surgical history, social history and family history with the patient and they are unchanged from previous Kelly.  ALLERGIES:  has No Known Allergies.  MEDICATIONS:  Current Outpatient Medications  Medication Sig Dispense Refill   apixaban (ELIQUIS) 5 MG TABS tablet TAKE 1 TABLET TWICE A DAY 180 tablet 1   brimonidine (ALPHAGAN) 0.2 % ophthalmic solution Place  1 drop into both eyes 2 (two) times daily.      cholecalciferol (VITAMIN D3) 25 MCG (1000 UNIT) tablet Take 1,000 Units by mouth daily.     flecainide  (TAMBOCOR) 50 MG tablet Take 1 tablet (50 mg total) by mouth 2 (two) times daily. 180 tablet 3   Lifitegrast (XIIDRA) 5 % SOLN Place 1 drop into both eyes daily.     LUMIGAN 0.01 % SOLN Place 1 drop into both eyes daily.     meclizine (ANTIVERT) 25 MG tablet Take 1 tablet (25 mg total) by mouth 3 (three) times daily as needed for dizziness. 30 tablet 1   metoprolol succinate (TOPROL-XL) 25 MG 24 hr tablet TAKE ONE-HALF (1/2) TABLET DAILY 45 tablet 3   Multiple Vitamin (MULTIVITAMIN WITH MINERALS) TABS tablet Take 1 tablet by mouth daily.      omeprazole (PRILOSEC) 20 MG capsule Take 1 capsule (20 mg total) by mouth daily. 90 capsule 3   Probiotic Product (PROBIOTIC DAILY PO) Take 1 capsule by mouth daily.      rosuvastatin (CRESTOR) 20 MG tablet Take 1 tablet (20 mg total) by mouth daily. 90 tablet 3   terazosin (HYTRIN) 2 MG capsule TAKE 1 CAPSULE AT BEDTIME 90 capsule 0   No current facility-administered medications for this visit.    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   12/12/2003 - 06/01/2004 Chemotherapy   He was treated with R. CHOP chemotherapy which show complete remission. The number of cycles of R. CHOP chemotherapy was unknown.   12/19/2006 Surgery   Lung resection show follicular lymphoma.   01/02/2007 - 09/01/2008 Chemotherapy   The patient was treated with single agent rituximab alone.   01/19/2007 Bone Marrow Biopsy   Bone marrow biopsy was negative.   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   05/08/2013 - 05/11/2013 Hospital Admission   The patient was admitted to the hospital for management of  pericarditis. CT scan showed extensive lymphadenopathy.   06/07/2013 Imaging   PET/CT scan showed extensive lymphadenopathy   06/25/2013 Procedure   He has placement of Infuse-a-Port.   06/28/2013 Bone Marrow Biopsy   Bone marrow biopsy is positive for lymphoma involvement.   07/04/2013 - 11/22/2013 Chemotherapy   He is treated with 6 cycles of bendamustine with rituximab.   09/24/2013 Imaging   Repeat PET scan show complete remission.   12/26/2013 Imaging   PEt scan showed complete remission   09/26/2014 Imaging   CT scan of the chest abdomen and pelvis show no evidence of disease   03/26/2015 Imaging   CT scan showed no evidence of lymphoma   04/14/2016 Imaging   CT: Borderline prominent right hilar and subcarinal lymph nodes, but not appreciably changed. 2. Low-grade but increased central mesenteric stranding with some small mesenteric lymph nodes. This could certainly be inflammatory, 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 6. Stable  mild biliary dilatation, much of which is likely a physiologic response to cholecystectomy. 7. Sigmoid colon diverticulosis. 8.  Prominent stool throughout the colon favors constipation. 9. Enlarged prostate gland, volume estimated at 75 cubic cm.   02/15/2017 Imaging   No evidence of recurrent lymphoma or other acute findings.  Stable moderate hiatal hernia.  Colonic diverticulosis, without radiographic evidence of diverticulitis.  Stable mildly enlarged prostate.  Mild emphysema.  Aortic and coronary artery atherosclerosis.   01/31/2019 Imaging   1. Interval development of abdominal and pelvic adenopathy compatible with recurrent lymphoma. 2. Emphysema and aortic  atherosclerosis. 3. Multi vessel coronary artery calcifications. 4. Moderate to large hiatal hernia   02/09/2019 Procedure   Successful CT-guided core biopsy of the left retroperitoneal periaortic adenopathy   02/09/2019 Pathology Results   SURGICAL PATHOLOGY  CASE: [id number]  PATIENT: Anthony Kelly  Surgical Pathology Report   Clinical History: Lymphoma; [name], LEFT PARA AORTIC, [name], cleaved nuclei and scant cytoplasm.  By immunohistochemistry, [name], [name], [name], [name] is low (less than 10%).  Flow cytometry was attempted; however, there was insufficient material for analysis (see [id number]).  Overall, the features are consistent with relapse of the patient's previously diagnosed follicular lymphoma.  Based on the biopsy, this is favored to be a low-grade follicular lymphoma   01/20/5091 -  Chemotherapy   The patient had idelisib for chemotherapy treatment.     05/17/2019 Imaging   1. Interval generalized decrease in abdominopelvic lymphadenopathy identified on the previous study. No new or progressive lymphadenopathy on today's study. 2. Moderate hiatal hernia. 3.  Emphysema (ICD10-J43.9) and Aortic Atherosclerosis (ICD10-170.0)   11/16/2019 Imaging   IMPRESSION: Chest Impression:   1. No mediastinal lymphadenopathy. 2. Post RIGHT lung wedge resection without evidence local recurrence.   Abdomen / Pelvis Impression:   1. Stable  numerous small periaortic retroperitoneal nodes and common iliac nodes. No progression of adenopathy. 2. No splenomegaly.  No skeletal metastasis.     07/31/2020 Imaging   1. Stable examination. No significant interval change in the prominent/mildly enlarged lymph nodes below the diaphragm. No thoracic adenopathy. No splenomegaly. 2. Postsurgical change of right lung wedge resection without evidence of local recurrence. 3. Large hiatal hernia. 4. Colonic diverticulosis without findings of acute diverticulitis. 5. Aortic Atherosclerosis (ICD10-I70.0) and Emphysema (ICD10-J43.9).     01/29/2021 Imaging   1. Overall mild interval enlargement of lymph nodes in the abdomen and pelvis as described. 2. No lymphadenopathy identified in the chest. 3. Emphysematous changes of the lungs. 4. Large hiatal hernia.  Extensive colonic diverticulosis. 5. Other ancillary findings as described.       PHYSICAL EXAMINATION: ECOG PERFORMANCE STATUS: 1 - Symptomatic but completely ambulatory  Vitals:   02/17/21 1032  BP: 107/63  Pulse: 71  Resp: 18  Temp: 98.7 F (37.1 C)  SpO2: 97%   Filed Weights   02/17/21 1032  Weight: 190 lb 6.4 oz (86.4 kg)    GENERAL:alert, no distress and  comfortable SKIN: Significant skin changes on his face from recent treatment for basal cell carcinoma  NEURO: alert & oriented x 3 with fluent speech, no focal motor/sensory deficits  LABORATORY DATA:  I have reviewed the data as listed    Component Value Date/Time   NA 139 02/17/2021 1020   NA 142 09/30/2020 1014   NA 139 04/14/2016 0943   K 4.6 02/17/2021 1020   K 4.6 04/14/2016 0943   CL 104 02/17/2021 1020   CL 104 03/24/2012 1024   CO2 29 02/17/2021 1020   CO2 25 04/14/2016 0943   GLUCOSE 124 (H) 02/17/2021 1020   GLUCOSE 111 04/14/2016 0943   GLUCOSE 80 03/24/2012 1024   BUN 15 02/17/2021 1020   BUN 12 09/30/2020 1014   BUN 8.6 04/14/2016 0943   CREATININE 1.05 02/17/2021 1020   CREATININE 1.00  10/31/2020 1004   CREATININE 1.1 04/14/2016 0943   CALCIUM 9.3 02/17/2021 1020   CALCIUM 9.4 04/14/2016 0943   PROT 6.1 (L) 02/17/2021 1020   PROT 6.2 09/30/2020 1014   PROT 6.5 04/14/2016 0943   ALBUMIN 3.9 02/17/2021 1020   ALBUMIN 4.5 09/30/2020 1014   ALBUMIN 3.8 04/14/2016 0943   AST 18 02/17/2021 1020   AST 23 10/31/2020 1004   AST 28 04/14/2016 0943   ALT 19 02/17/2021 1020   ALT 20 10/31/2020 1004   ALT 41 04/14/2016 0943   ALKPHOS 54 02/17/2021 1020   ALKPHOS 71 04/14/2016 0943   BILITOT 0.7 02/17/2021 1020   BILITOT 0.6 10/31/2020 1004   BILITOT 0.61 04/14/2016 0943   GFRNONAA >60 02/17/2021 1020   GFRNONAA >60 10/31/2020 1004   GFRAA 69 09/27/2019 0801    No results found for: SPEP, UPEP  Lab Results  Component Value Date   WBC 8.4 02/17/2021   NEUTROABS 5.5 02/17/2021   HGB 15.1 02/17/2021   HCT 45.6 02/17/2021   MCV 88.5 02/17/2021   PLT 162 02/17/2021      Chemistry      Component Value Date/Time   NA 139 02/17/2021 1020   NA 142 09/30/2020 1014   NA 139 04/14/2016 0943   K 4.6 02/17/2021 1020   K 4.6 04/14/2016 0943   CL 104 02/17/2021 1020   CL 104 03/24/2012 1024   CO2 29 02/17/2021 1020   CO2 25 04/14/2016 0943   BUN 15 02/17/2021 1020   BUN 12 09/30/2020 1014   BUN 8.6 04/14/2016 0943   CREATININE 1.05 02/17/2021 1020   CREATININE 1.00 10/31/2020 1004   CREATININE 1.1 04/14/2016 0943      Component Value Date/Time   CALCIUM 9.3 02/17/2021 1020   CALCIUM 9.4 04/14/2016 0943   ALKPHOS 54 02/17/2021 1020   ALKPHOS 71 04/14/2016 0943   AST 18 02/17/2021 1020   AST 23 10/31/2020 1004   AST 28 04/14/2016 0943   ALT 19 02/17/2021 1020   ALT 20 10/31/2020 1004   ALT 41 04/14/2016 0943   BILITOT 0.7 02/17/2021 1020   BILITOT 0.6 10/31/2020 1004   BILITOT 0.61 04/14/2016 0943       RADIOGRAPHIC STUDIES: I have reviewed his recent CT imaging with the patient and his wife I have personally reviewed the radiological images as listed  and agreed with the findings in the report. DG Chest 2 View  Result Date: 01/26/2021 CLINICAL DATA:  Wheezing. EXAM: CHEST - 2 VIEW COMPARISON:  Chest x-ray dated March 28, 2019. FINDINGS: Normal heart size. Unchanged large hiatal hernia. Similar postsurgical changes  near the right hilum. No focal consolidation, pleural effusion, or pneumothorax. Unchanged mild scarring in the medial right lower lobe. No acute osseous abnormality. IMPRESSION: 1. No acute cardiopulmonary disease. Electronically Signed   By: Titus Dubin M.D.   On: 01/26/2021 15:55   CT CHEST ABDOMEN PELVIS W CONTRAST  Result Date: 01/29/2021 CLINICAL DATA:  Follicular lymphoma, follow-up EXAM: CT CHEST, ABDOMEN, AND PELVIS WITH CONTRAST TECHNIQUE: Multidetector CT imaging of the chest, abdomen and pelvis was performed following the standard protocol during bolus administration of intravenous contrast. RADIATION DOSE REDUCTION: This exam was performed according to the departmental dose-optimization program which includes automated exposure control, adjustment of the mA and/or kV according to patient size and/or use of iterative reconstruction technique. CONTRAST:  185m OMNIPAQUE IOHEXOL 300 MG/ML  SOLN COMPARISON:  CT chest, abdomen and pelvis 07/30/2020 FINDINGS: CT CHEST FINDINGS Cardiovascular: Heart size is normal. No pericardial effusion identified. Extensive coronary artery calcifications. Main pulmonary artery is normal caliber. Thoracic aorta is tortuous and normal caliber with mild-to-moderate atherosclerotic plaques. Mediastinum/Nodes: No bulky axillary, mediastinal or hilar lymphadenopathy identified. Lungs/Pleura: Lungs are hyperinflated with moderate emphysematous changes. Stable surgical changes from wedge resection in the right lung. Stable biapical pleural thickening. Mild bronchiectasis. No new or enlarged pulmonary nodule identified. No pleural effusion or pneumothorax. Musculoskeletal: No suspicious bony lesions  identified in the chest. CT ABDOMEN PELVIS FINDINGS Hepatobiliary: Liver is normal in size and contour. Stable hypodense likely cyst in the left lobe measuring 13 mm. Gallbladder is surgically absent. No significant biliary ductal dilatation identified. Pancreas: Unremarkable. No pancreatic ductal dilatation or surrounding inflammatory changes. Spleen: Normal in size without focal abnormality. Adrenals/Urinary Tract: Adrenal glands are normal. A few simple appearing left renal cortical cysts again seen measuring up to 4.2 cm in the lower pole. No hydronephrosis or enhancing renal mass identified bilaterally. Urinary bladder appears within normal limits. Stomach/Bowel: Large hiatal hernia. No bowel obstruction, free air or pneumatosis. Extensive colonic diverticulosis. No bowel wall edema identified. No evidence of acute appendicitis. Vascular/Lymphatic: Numerous mildly enlarged upper abdominal and para-aortic retroperitoneal lymph nodes are visualized. The nodes are all either stable or mildly enlarged since previous study, including mild enlargement of a portacaval lymph node measuring 11 mm in short axis, a retroperitoneal lymph node just anterior to the left adrenal gland now measuring 12 mm in short axis and slightly enlarged distal periaortic lymph nodes measuring up to 13 mm at the aortic bifurcation. Interval increased mesenteric fat stranding along the root of the mesentery with accompanying lymph nodes which are also slightly increased in size since previous study measuring up to 9.5 mm in short axis. Mild interval enlargement of bilateral inguinal lymph nodes measuring up to 11 mm in short axis on the right and 10 mm on the left. Reproductive: Prostate gland is enlarged with nodular protrusion into the base of the urinary bladder. Other: No ascites Musculoskeletal: Degenerative changes in the lumbar spine. No suspicious bony lesions identified. IMPRESSION: 1. Overall mild interval enlargement of lymph  nodes in the abdomen and pelvis as described. 2. No lymphadenopathy identified in the chest. 3. Emphysematous changes of the lungs. 4. Large hiatal hernia.  Extensive colonic diverticulosis. 5. Other ancillary findings as described. Electronically Signed   By: DOfilia NeasM.D.   On: 01/29/2021 10:51

## 2021-02-17 NOTE — Telephone Encounter (Signed)
Faxed  referral to Dr. Raylene Everts at [facility]. Fax confirmation received.

## 2021-02-17 NOTE — Telephone Encounter (Signed)
Faxed referral to [facility] to [phone], received fax confirmation.

## 2021-02-17 NOTE — Assessment & Plan Note (Signed)
I have reviewed multiple imaging studies with the patient and his wife His recent CT imaging showed small lymphadenopathy throughout but nothing suspicious for disease progression We discussed the current NCCN guidelines There is no indication for him to resume any form of treatment The patient is frail and had infection recently when he become neutropenic from chemotherapy The patient and his wife expressed concern and is not comfortable for watchful observation His wife inquired about CAR-T cell I recommend second opinion at [facility] for further discussion as well as the role of CAR-T cell I plan to see him again in 3 months for further follow-up we will repeat imaging study in 6 months

## 2021-02-17 NOTE — Assessment & Plan Note (Signed)
The patient have history of benign prostate hypertrophy I reviewed his recent CT imaging which confirmed enlarged prostate The patient is very symptomatic, routinely would wake up 5 times a night to urinate He request that urology consult which I think is appropriate and we will set up consult to [facility] closer to where he lives

## 2021-02-18 ENCOUNTER — Telehealth: Payer: Self-pay

## 2021-02-18 NOTE — Telephone Encounter (Signed)
Return call to [facility], she had questions about referrals placed.  I confirmed referrals were placed to [facility] and that they should be contacting pt/spouse.  [name] verbalized understanding and thanks

## 2021-02-19 ENCOUNTER — Telehealth: Payer: Self-pay

## 2021-02-19 NOTE — Telephone Encounter (Signed)
Called Brandy back at [facility] regarding Referral. Faxed requested information to [phone], received confirmation.

## 2021-02-25 ENCOUNTER — Ambulatory Visit (INDEPENDENT_AMBULATORY_CARE_PROVIDER_SITE_OTHER): Payer: Medicare Other

## 2021-02-25 VITALS — Wt 183.0 lb

## 2021-02-25 DIAGNOSIS — K649 Unspecified hemorrhoids: Secondary | ICD-10-CM | POA: Insufficient documentation

## 2021-02-25 DIAGNOSIS — I498 Other specified cardiac arrhythmias: Secondary | ICD-10-CM | POA: Insufficient documentation

## 2021-02-25 DIAGNOSIS — H401190 Primary open-angle glaucoma, unspecified eye, stage unspecified: Secondary | ICD-10-CM | POA: Insufficient documentation

## 2021-02-25 DIAGNOSIS — C8589 Other specified types of non-Hodgkin lymphoma, extranodal and solid organ sites: Secondary | ICD-10-CM | POA: Insufficient documentation

## 2021-02-25 DIAGNOSIS — Z0000 Encounter for general adult medical examination without abnormal findings: Secondary | ICD-10-CM

## 2021-02-25 DIAGNOSIS — B359 Dermatophytosis, unspecified: Secondary | ICD-10-CM | POA: Insufficient documentation

## 2021-02-25 DIAGNOSIS — H4920 Sixth [abducent] nerve palsy, unspecified eye: Secondary | ICD-10-CM

## 2021-02-25 DIAGNOSIS — M545 Low back pain, unspecified: Secondary | ICD-10-CM | POA: Insufficient documentation

## 2021-02-25 DIAGNOSIS — H2510 Age-related nuclear cataract, unspecified eye: Secondary | ICD-10-CM | POA: Insufficient documentation

## 2021-02-25 DIAGNOSIS — H04123 Dry eye syndrome of bilateral lacrimal glands: Secondary | ICD-10-CM | POA: Insufficient documentation

## 2021-02-25 DIAGNOSIS — Z961 Presence of intraocular lens: Secondary | ICD-10-CM | POA: Insufficient documentation

## 2021-02-25 HISTORY — DX: Sixth (abducent) nerve palsy, unspecified eye: H49.20

## 2021-02-25 NOTE — Patient Instructions (Signed)
[name] , Thank you for taking time to come for your Medicare Wellness Visit. I appreciate your ongoing commitment to your health goals. Please review the following plan we discussed and let me know if I can assist you in the future.   Screening recommendations/referrals: Colonoscopy: Done 02/22/2014 - Repeat in 10 years Recommended yearly ophthalmology/optometry visit for glaucoma screening and checkup Recommended yearly dental visit for hygiene and checkup  Vaccinations: Influenza vaccine: Done 09/12/2020 - Repeat annually  Pneumococcal vaccine: Done 02/12/2013 & 09/25/2012 Tdap vaccine: Done 08/11/2010 - Repeat in 10 years *due now - get at next visit Shingles vaccine: Done 02/17/2018 & 08/05/2018   Covid-19: Done 04/11/2019, 05/07/2019, & 12/18/2019  Advanced directives: in chart  Conditions/risks identified: Aim for 30 minutes of exercise or brisk walking each day, drink 6-8 glasses of water and eat lots of fruits and vegetables.   Next appointment: Follow up in one year for your annual wellness visit.   Preventive Care 77 Years and Older, Male  Preventive care refers to lifestyle choices and visits with your health care provider that can promote health and wellness. What does preventive care include? A yearly physical exam. This is also called an annual well check. Dental exams once or twice a year. Routine eye exams. Ask your health care provider how often you should have your eyes checked. Personal lifestyle choices, including: Daily care of your teeth and gums. Regular physical activity. Eating a healthy diet. Avoiding tobacco and drug use. Limiting alcohol use. Practicing safe sex. Taking low doses of aspirin every day. Taking vitamin and mineral supplements as recommended by your health care provider. What happens during an annual well check? The services and screenings done by your health care provider during your annual well check will depend on your age, overall health,  lifestyle risk factors, and family history of disease. Counseling  Your health care provider may ask you questions about your: Alcohol use. Tobacco use. Drug use. Emotional well-being. Home and relationship well-being. Sexual activity. Eating habits. History of falls. Memory and ability to understand (cognition). Work and work Statistician. Screening  You may have the following tests or measurements: Height, weight, and BMI. Blood pressure. Lipid and cholesterol levels. These may be checked every 5 years, or more frequently if you are over 21 years old. Skin check. Lung cancer screening. You may have this screening every year starting at age 77 if you have a 30-pack-year history of smoking and currently smoke or have quit within the past 15 years. Fecal occult blood test (FOBT) of the stool. You may have this test every year starting at age 77. Flexible sigmoidoscopy or colonoscopy. You may have a sigmoidoscopy every 5 years or a colonoscopy every 10 years starting at age 77. Prostate cancer screening. Recommendations will vary depending on your family history and other risks. Hepatitis C blood test. Hepatitis B blood test. Sexually transmitted disease (STD) testing. Diabetes screening. This is done by checking your blood sugar (glucose) after you have not eaten for a while (fasting). You may have this done every 1-3 years. Abdominal aortic aneurysm (AAA) screening. You may need this if you are a current or former smoker. Osteoporosis. You may be screened starting at age 77 if you are at high risk. Talk with your health care provider about your test results, treatment options, and if necessary, the need for more tests. Vaccines  Your health care provider may recommend certain vaccines, such as: Influenza vaccine. This is recommended every year. Tetanus, diphtheria,  and acellular pertussis (Tdap, Td) vaccine. You may need a Td booster every 10 years. Zoster vaccine. You may need this  after age 77. Pneumococcal 13-valent conjugate (PCV13) vaccine. One dose is recommended after age 6. Pneumococcal polysaccharide (PPSV23) vaccine. One dose is recommended after age 77. Talk to your health care provider about which screenings and vaccines you need and how often you need them. This information is not intended to replace advice given to you by your health care provider. Make sure you discuss any questions you have with your health care provider. Document Released: 01/24/2015 Document Revised: 09/17/2015 Document Reviewed: 10/29/2014 Elsevier Interactive Patient Education  2017 [employer] Prevention in the Home Falls can cause injuries. They can happen to people of all ages. There are many things you can do to make your home safe and to help prevent falls. What can I do on the outside of my home? Regularly fix the edges of walkways and driveways and fix any cracks. Remove anything that might make you trip as you walk through a door, such as a raised step or threshold. Trim any bushes or trees on the path to your home. Use bright outdoor lighting. Clear any walking paths of anything that might make someone trip, such as rocks or tools. Regularly check to see if handrails are loose or broken. Make sure that both sides of any steps have handrails. Any raised decks and porches should have guardrails on the edges. Have any leaves, snow, or ice cleared regularly. Use sand or salt on walking paths during winter. Clean up any spills in your garage right away. This includes oil or grease spills. What can I do in the bathroom? Use night lights. Install grab bars by the toilet and in the tub and shower. Do not use towel bars as grab bars. Use non-skid mats or decals in the tub or shower. If you need to sit down in the shower, use a plastic, non-slip stool. Keep the floor dry. Clean up any water that spills on the floor as soon as it happens. Remove soap buildup in the tub or  shower regularly. Attach bath mats securely with double-sided non-slip rug tape. Do not have throw rugs and other things on the floor that can make you trip. What can I do in the bedroom? Use night lights. Make sure that you have a light by your bed that is easy to reach. Do not use any sheets or blankets that are too big for your bed. They should not hang down onto the floor. Have a firm chair that has side arms. You can use this for support while you get dressed. Do not have throw rugs and other things on the floor that can make you trip. What can I do in the kitchen? Clean up any spills right away. Avoid walking on wet floors. Keep items that you use a lot in easy-to-reach places. If you need to reach something above you, use a strong step stool that has a grab bar. Keep electrical cords out of the way. Do not use floor polish or wax that makes floors slippery. If you must use wax, use non-skid floor wax. Do not have throw rugs and other things on the floor that can make you trip. What can I do with my stairs? Do not leave any items on the stairs. Make sure that there are handrails on both sides of the stairs and use them. Fix handrails that are broken or loose. Make sure  that handrails are as long as the stairways. Check any carpeting to make sure that it is firmly attached to the stairs. Fix any carpet that is loose or worn. Avoid having throw rugs at the top or bottom of the stairs. If you do have throw rugs, attach them to the floor with carpet tape. Make sure that you have a light switch at the top of the stairs and the bottom of the stairs. If you do not have them, ask someone to add them for you. What else can I do to help prevent falls? Wear shoes that: Do not have high heels. Have rubber bottoms. Are comfortable and fit you well. Are closed at the toe. Do not wear sandals. If you use a stepladder: Make sure that it is fully opened. Do not climb a closed stepladder. Make  sure that both sides of the stepladder are locked into place. Ask someone to hold it for you, if possible. Clearly mark and make sure that you can see: Any grab bars or handrails. First and last steps. Where the edge of each step is. Use tools that help you move around (mobility aids) if they are needed. These include: Canes. Walkers. Scooters. Crutches. Turn on the lights when you go into a dark area. Replace any light bulbs as soon as they burn out. Set up your furniture so you have a clear path. Avoid moving your furniture around. If any of your floors are uneven, fix them. If there are any pets around you, be aware of where they are. Review your medicines with your doctor. Some medicines can make you feel dizzy. This can increase your chance of falling. Ask your doctor what other things that you can do to help prevent falls. This information is not intended to replace advice given to you by your health care provider. Make sure you discuss any questions you have with your health care provider. Document Released: 10/24/2008 Document Revised: 06/05/2015 Document Reviewed: 02/01/2014 Elsevier Interactive Patient Education  2017 [employer].

## 2021-02-25 NOTE — Progress Notes (Signed)
Subjective:   Anthony Kelly is a 76 y.o. male who presents for Medicare Annual/Subsequent preventive examination.  Virtual Visit via Telephone Note  I connected with  Anthony Kelly on 02/25/21 at 10:30 AM EST by telephone and verified that I am speaking with the correct person using two identifiers.  Location: Patient: Home Provider: [facility] Persons participating in the virtual visit: patient/Nurse Health Advisor   I discussed the limitations, risks, security and privacy concerns of performing an evaluation and management service by telephone and the availability of in person appointments. The patient expressed understanding and agreed to proceed.  Interactive audio and video telecommunications were attempted between this nurse and patient, however failed, due to patient having technical difficulties OR patient did not have access to video capability.  We continued and completed visit with audio only.  Some vital signs may be absent or patient reported.   Kelsay Haggard E Keyshaun Exley, LPN   Review of Systems     Cardiac Risk Factors include: advanced age (>41men, >43 women);sedentary lifestyle;family history of premature cardiovascular disease;hypertension;dyslipidemia;Other (see comment);male gender, Risk factor comments: A.fib, atherosclerosis, emphysema, thrombocytopenia, lymphoma     Objective:    Today's Vitals   02/25/21 0957  Weight: 183 lb (83 kg)   Body mass index is 24.82 kg/m.  Advanced Directives 02/25/2021 07/24/2020 02/25/2020 06/05/2019 03/28/2019 02/21/2019 02/09/2019  Does Patient Have a Medical Advance Directive? Yes Yes Yes Yes No Yes Yes  Type of Paramedic of [place];Living will Living will [place];Living will [place];Living will - [place];Living will [place];Living will  Does patient want to make changes to medical advance directive? - No - Patient declined No - Patient  declined - - No - Patient declined -  Copy of [place] in Chart? Yes - validated most recent copy scanned in chart (See row information) - - - - No - copy requested No - copy requested  Would patient like information on creating a medical advance directive? - - - - - - -  Pre-existing out of facility DNR order (yellow form or pink MOST form) - - - - - - -    Current Medications (verified) Outpatient Encounter Medications as of 02/25/2021  Medication Sig   apixaban (ELIQUIS) 5 MG TABS tablet TAKE 1 TABLET TWICE A DAY   Ascorbic Acid (VITAMIN C) 1000 MG tablet    brimonidine (ALPHAGAN) 0.2 % ophthalmic solution Place 1 drop into both eyes 2 (two) times daily.    cholecalciferol (VITAMIN D3) 25 MCG (1000 UNIT) tablet Take 1,000 Units by mouth daily.   flecainide (TAMBOCOR) 50 MG tablet Take 1 tablet (50 mg total) by mouth 2 (two) times daily.   Lifitegrast (XIIDRA) 5 % SOLN Place 1 drop into both eyes daily.   LUMIGAN 0.01 % SOLN Place 1 drop into both eyes daily.   meclizine (ANTIVERT) 25 MG tablet Take 1 tablet (25 mg total) by mouth 3 (three) times daily as needed for dizziness.   metoprolol succinate (TOPROL-XL) 25 MG 24 hr tablet TAKE ONE-HALF (1/2) TABLET DAILY   Multiple Vitamin (MULTIVITAMIN WITH MINERALS) TABS tablet Take 1 tablet by mouth daily.    omeprazole (PRILOSEC) 20 MG capsule Take 1 capsule (20 mg total) by mouth daily.   Probiotic Product (PROBIOTIC DAILY PO) Take 1 capsule by mouth daily.    rosuvastatin (CRESTOR) 20 MG tablet Take 1 tablet (20 mg total) by mouth daily.   terazosin (  HYTRIN) 2 MG capsule TAKE 1 CAPSULE AT BEDTIME   No facility-administered encounter medications on file as of 02/25/2021.    Allergies (verified) Patient has no known allergies.   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 04/2013 -adm with CP, elevated CRP. H/o coronary artery calcification on prior CT but  nuc was negative, EF 71%.   Arthritis    Atrial fibrillation ([place])    a. Isolated episode in the setting of acute pericarditis 04/2013. Was not placed on anticoag.   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   Metastasis to lung ([place]) dx'd 01/2007   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   lt submandibular ln   Pain in joint, pelvic region and thigh 08/01/2013   PSVT (paroxysmal supraventricular tachycardia) (HCC)    SCC (squamous cell carcinoma)    Left cheek   Past Surgical History:  Procedure Laterality Date   CHOLECYSTECTOMY     EXPLORATORY LAPAROTOMY     EYE SURGERY Right 2016   cataract   LEFT HEART CATH AND CORONARY ANGIOGRAPHY N/A 06/05/2019   Procedure: LEFT HEART CATH AND CORONARY ANGIOGRAPHY;  Surgeon: Jettie Booze, MD;  Location: [facility];  Service: Cardiovascular;  Laterality: N/A;   LUNG LOBECTOMY     right side   LYMPH NODE BIOPSY     in groin with removal   MENISCUS REPAIR     right knee   MOHS SURGERY Left 09/2020   cheek   PROSTATE SURGERY     REFRACTIVE SURGERY Right    piece of metal removed   SUBMANDIBULAR GLAND EXCISION  04/2011   SUBMANDIBULAR GLAND EXCISION  04/30/2011   Procedure: EXCISION SUBMANDIBULAR GLAND;  Surgeon: Jerrell Belfast, MD;  Location: [facility] OR;  Service: ENT;  Laterality: Left;  WITH DIAGNOSTIC BIOPSY   TONSILLECTOMY     as a child   VEIN LIGATION AND STRIPPING     right leg   Family History  Problem Relation  Age of Onset   Heart disease Father    Heart attack Father        x 3   Cancer Father    Cancer Sister        breast ca   Cancer Brother        prostate ca   Heart attack Sister    Cancer Sister        breast   Heart disease Brother    Alzheimer's disease Sister    Diabetes Sister    Heart disease Brother    Diabetes Brother    Heart disease Brother    Heart disease Brother    Heart disease Brother    Heart disease Brother    Heart disease Brother    Alzheimer's disease Brother    Diabetes Son    Cancer Brother    Anesthesia problems Neg Hx  Social History   Socioeconomic History   Marital status: Married    Spouse name: [name]   Number of children: 2   Years of education: GED   Highest education level: GED or equivalent  Occupational History   Occupation: Retired     Comment: Secretary/administrator   Occupation: Retired     Comment: [employer]  Tobacco Use   Smoking status: Former    Packs/day: 1.00    Years: 15.00    Pack years: 15.00    Types: Cigarettes    Start date: 12/12/1962    Quit date: 08/25/1984    Years since quitting: 36.5   Smokeless tobacco: Never  Vaping Use   Vaping Use: Never used  Substance and Sexual Activity   Alcohol use: Not Currently   Drug use: Never   Sexual activity: Not Currently  Other Topics Concern   Not on file  Social History Narrative   Right handed   Decaf coffee (2-3 per day)   Lives with wife   Social Determinants of Health   Financial Resource Strain: Low Risk    Difficulty of Paying Living Expenses: Not hard at all  Food Insecurity: No Food Insecurity   Worried About Charity fundraiser in the Last Year: Never true   [place] in the Last Year: Never true  Transportation Needs: No Transportation Needs   Lack of Transportation (Medical): No   Lack of Transportation (Non-Medical): No  Physical Activity: Inactive   Days of Exercise per Week: 0 days   Minutes of Exercise per Session: 0 min  Stress: No  Stress Concern Present   Feeling of Stress : Not at all  Social Connections: Socially Isolated   Frequency of Communication with Friends and Family: Once a week   Frequency of Social Gatherings with Friends and Family: Once a week   Attends Religious Services: Never   Marine scientist or Organizations: No   Attends Music therapist: Never   Marital Status: Married    Tobacco Counseling Counseling given: Not Answered   Clinical Intake:  Pre-visit preparation completed: Yes  Pain : No/denies pain     BMI - recorded: 24.82 Nutritional Status: BMI of 19-24  Normal Nutritional Risks: None Diabetes: No  How often do you need to have someone help you when you read instructions, pamphlets, or other written materials from your doctor or pharmacy?: 1 - Never  Diabetic? no  Interpreter Needed?: No  Information entered by :: Shemia Bevel, LPN   Activities of Daily Living In your present state of health, do you have any difficulty performing the following activities: 02/25/2021 02/23/2021  Hearing? N N  Vision? N N  Difficulty concentrating or making decisions? Tempie Donning  Walking or climbing stairs? N N  Dressing or bathing? N N  Doing errands, shopping? N N  Preparing Food and eating ? N N  Using the Toilet? N N  In the past six months, have you accidently leaked urine? N N  Do you have problems with loss of bowel control? N N  Managing your Medications? N N  Managing your Finances? N N  Housekeeping or managing your Housekeeping? N N  Some recent data might be hidden    Patient Care Team: Dettinger, [name], MD as PCP - General (Family Medicine) Branch, Alphonse Guild, MD as PCP - Cardiology (Cardiology) Thompson Grayer, MD as PCP - Electrophysiology (Cardiology) [name], MD as Consulting Physician (Gastroenterology) Alvy Bimler,  Ni, [name], MD as Consulting Physician (Dermatology)  Indicate  any recent Medical Services you may have received from other than [facility] providers in the past year (date may be approximate).     Assessment:   This is a routine wellness examination for [facility].  Hearing/Vision screen Hearing Screening - Comments:: Denies hearing difficulties   Vision Screening - Comments:: Wears rx glasses - up to date with routine eye exams with Dr Darrick Meigs in [place]  Dietary issues and exercise activities discussed: Current Exercise Habits: The patient does not participate in regular exercise at present, Exercise limited by: respiratory conditions(s);cardiac condition(s)   Goals Addressed             This Visit's Progress    AWV   On track    02/25/2020 AWV Goal: Fall Prevention  Over the next year, patient will decrease their risk for falls by: Using assistive devices, such as a cane or walker, as needed Identifying fall risks within their home and correcting them by: Removing throw rugs Adding handrails to stairs or ramps Removing clutter and keeping a clear pathway throughout the home Increasing light, especially at night Adding shower handles/bars Raising toilet seat Identifying potential personal risk factors for falls: Medication side effects Incontinence/urgency Vestibular dysfunction Hearing loss Musculoskeletal disorders Neurological disorders Orthostatic hypotension       DIET - EAT MORE FRUITS AND VEGETABLES   On track    Try to include 3-5 servings of fruits and vegetables per day.     DIET - INCREASE WATER INTAKE   On track    Try to drink 6-8 glasses of water daily       Depression Screen PHQ 2/9 Scores 02/25/2021 12/08/2020 10/02/2020 03/31/2020 02/25/2020 10/01/2019 03/26/2019  PHQ - 2 Score 0 0 0 0 0 0 0  PHQ- 9 Score - - 0 - - - -    Fall Risk Fall Risk  02/25/2021 02/23/2021 12/08/2020 10/02/2020 03/31/2020  Falls in the past year? 0 0 0 0 0  Number falls in past yr: 0 - - - -  Injury with Fall? 0 - - - -  Risk for fall  due to : No Fall Risks - - - -  Follow up Falls prevention discussed - - - -    FALL RISK PREVENTION PERTAINING TO THE HOME:  Any stairs in or around the home? Yes  If so, are there any without handrails? No  Home free of loose throw rugs in walkways, pet beds, electrical cords, etc? Yes  Adequate lighting in your home to reduce risk of falls? Yes   ASSISTIVE DEVICES UTILIZED TO PREVENT FALLS:  Life alert? No  Use of a cane, walker or w/c? No  Grab bars in the bathroom? No  Shower chair or bench in shower? No  Elevated toilet seat or a handicapped toilet? Yes   TIMED UP AND GO:  Was the test performed? No . Telephonic visit   Cognitive Function: MMSE - Mini Mental State Exam 02/17/2018 02/06/2015  Orientation to time 5 5  Orientation to Place 5 5  Registration 3 3  Attention/ Calculation 5 5  Recall 2 3  Language- name 2 objects 2 2  Language- repeat 1 1  Language- follow 3 step command 3 3  Language- read & follow direction 1 1  Write a sentence 1 1  Copy design 1 1  Total score 29 30     6CIT Screen 02/25/2021  02/25/2020 02/21/2019  What Year? 0 points 0 points 0 points  What month? 0 points - 0 points  What time? 0 points 0 points 0 points  Count back from 20 0 points 0 points 0 points  Months in reverse 0 points 4 points 4 points  Repeat phrase 0 points 0 points 2 points  Total Score 0 - 6    Immunizations Immunization History  Administered Date(s) Administered   Fluad Quad(high Dose 65+) 09/12/2020   Influenza, High Dose Seasonal PF 10/12/2016, 10/01/2017   Influenza,inj,Quad PF,6+ Mos 09/27/2014, 10/16/2015   Influenza-Unspecified 09/11/2012, 11/11/2013, 10/12/2016, 09/12/2018   Moderna Sars-Covid-2 Vaccination 04/11/2019, 05/07/2019, 12/18/2019   Pneumococcal Conjugate-13 03/07/2013   Pneumococcal Polysaccharide-23 09/25/2012   Pneumococcal-Unspecified 04/03/2001, 08/09/2012   Tdap 08/11/2010   Zoster Recombinat (Shingrix) 02/17/2018, 08/05/2018     TDAP status: Due, Education has been provided regarding the importance of this vaccine. Advised may receive this vaccine at local pharmacy or Health Dept. Aware to provide a copy of the vaccination record if obtained from local pharmacy or Health Dept. Verbalized acceptance and understanding.  Flu Vaccine status: Up to date  Pneumococcal vaccine status: Up to date  Covid-19 vaccine status: Completed vaccines  Qualifies for Shingles Vaccine? Yes   Zostavax completed Yes   Shingrix Completed?: Yes  Screening Tests Health Maintenance  Topic Date Due   DEXA SCAN  09/28/2017   HEMOGLOBIN A1C  10/01/2020   TETANUS/TDAP  10/02/2021 (Originally 08/10/2020)   COVID-19 Vaccine (4 - Booster for Moderna series) 10/19/2021 (Originally 02/12/2020)   OPHTHALMOLOGY EXAM  07/28/2021   COLONOSCOPY (Pts 45-28yrs Insurance coverage will need to be confirmed)  02/23/2024   Pneumonia Vaccine 64+ Years old  Completed   INFLUENZA VACCINE  Completed   Hepatitis C Screening  Completed   Zoster Vaccines- Shingrix  Completed   HPV VACCINES  Aged Out   FOOT EXAM  Discontinued    Health Maintenance  Health Maintenance Due  Topic Date Due   DEXA SCAN  09/28/2017   HEMOGLOBIN A1C  10/01/2020    Colorectal cancer screening: Type of screening: Colonoscopy. Completed 02/22/2014. Repeat every 10 years  Lung Cancer Screening: (Low Dose CT Chest recommended if Age 47-80 years, 30 pack-year currently smoking OR have quit w/in 15years.) does not qualify.  Additional Screening:  Hepatitis C Screening: does qualify; Completed 03/22/2019  Vision Screening: Recommended annual ophthalmology exams for early detection of glaucoma and other disorders of the eye. Is the patient up to date with their annual eye exam?  Yes  Who is the provider or what is the name of the office in which the patient attends annual eye exams? Dr Darrick Meigs in [place] and specialist Dr Basilia Jumbo If pt is not established with a  provider, would they like to be referred to a provider to establish care? No .   Dental Screening: Recommended annual dental exams for proper oral hygiene  Community Resource Referral / Chronic Care Management: CRR required this visit?  No   CCM required this visit?  No      Plan:     I have personally reviewed and noted the following in the patients chart:   Medical and social history Use of alcohol, tobacco or illicit drugs  Current medications and supplements including opioid prescriptions. Patient is not currently taking opioid prescriptions. Functional ability and status Nutritional status Physical activity Advanced directives List of other physicians Hospitalizations, surgeries, and ER visits in previous 12 months Vitals Screenings to include cognitive, depression, and  falls Referrals and appointments  In addition, I have reviewed and discussed with patient certain preventive protocols, quality metrics, and best practice recommendations. A written personalized care plan for preventive services as well as general preventive health recommendations were provided to patient.     Sandrea Hammond, LPN   05/28/3356   Nurse Notes: None

## 2021-03-02 ENCOUNTER — Other Ambulatory Visit: Payer: Self-pay

## 2021-03-02 ENCOUNTER — Encounter: Payer: Self-pay | Admitting: Urology

## 2021-03-02 ENCOUNTER — Ambulatory Visit (INDEPENDENT_AMBULATORY_CARE_PROVIDER_SITE_OTHER): Payer: Medicare Other | Admitting: Urology

## 2021-03-02 VITALS — BP 128/77 | HR 74 | Wt 185.0 lb

## 2021-03-02 DIAGNOSIS — R339 Retention of urine, unspecified: Secondary | ICD-10-CM | POA: Diagnosis not present

## 2021-03-02 DIAGNOSIS — N401 Enlarged prostate with lower urinary tract symptoms: Secondary | ICD-10-CM | POA: Diagnosis not present

## 2021-03-02 DIAGNOSIS — I2510 Atherosclerotic heart disease of native coronary artery without angina pectoris: Secondary | ICD-10-CM

## 2021-03-02 DIAGNOSIS — N400 Enlarged prostate without lower urinary tract symptoms: Secondary | ICD-10-CM | POA: Diagnosis not present

## 2021-03-02 DIAGNOSIS — N138 Other obstructive and reflux uropathy: Secondary | ICD-10-CM | POA: Diagnosis not present

## 2021-03-02 LAB — URINALYSIS, ROUTINE W REFLEX MICROSCOPIC
Bilirubin, UA: NEGATIVE
Glucose, UA: NEGATIVE
Ketones, UA: NEGATIVE
Leukocytes,UA: NEGATIVE
Nitrite, UA: NEGATIVE
Protein,UA: NEGATIVE
RBC, UA: NEGATIVE
Specific Gravity, UA: <1.005 — ABNORMAL LOW (ref 1.005–1.030)
Urobilinogen, Ur: 0.2 mg/dL (ref 0.2–1.0)
pH, UA: 6 (ref 5.0–7.5)

## 2021-03-02 LAB — BLADDER SCAN AMB NON-IMAGING: Scan Result: 250

## 2021-03-02 MED ORDER — ALFUZOSIN HCL ER 10 MG PO TB24: 10.0000 mg | ORAL_TABLET | Freq: Every day | ORAL | 11 refills | Status: DC

## 2021-03-02 NOTE — Addendum Note (Signed)
Addended byIris Pert on: 03/02/2021 02:04 PM   Modules accepted: Orders

## 2021-03-02 NOTE — Progress Notes (Signed)
Assessment: 1. BPH with obstruction/lower urinary tract symptoms   2. Incomplete bladder emptying     Plan: D/C terazosin Trial of alfuzosin 10 mg daily.  Rx sent. PSA today Return to office in 3-4 weeks with bladder scan.  Chief Complaint:  Chief Complaint  Patient presents with   Benign Prostatic Hypertrophy    History of Present Illness:  Anthony Kelly is a 76 y.o. year old male who is seen in consultation from [name], MD for evaluation of BPH with obstruction.  He has a long history of BPH with lower urinary tract symptoms.  He underwent a TUNA procedure approximately 15 years ago in [place].  He noted improvement in his symptoms following this.  He has had gradual worsening of his urinary symptoms over the past few years.  He currently reports daytime frequency, voiding every 1-2 hours, nocturia 5-8 times, urgency, decreased force of stream, and postvoid dribbling.  He voids with a slow stream.  No dysuria or gross hematuria.  No history of UTIs.  He currently takes terazosin 2 mg nightly.  He has previously tried tamsulosin without benefit. IPSS = 30 today. CT imaging from 01/29/2021 showed left renal cyst, no hydronephrosis, normal-appearing bladder, and enlargement of the prostate with nodular protrusion into the bladder base.  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 04/2013 -adm with CP, elevated CRP. H/o coronary artery calcification on prior CT but nuc was negative, EF 71%.   Arthritis    Atrial fibrillation ([place])    a. Isolated episode in the setting of acute pericarditis 04/2013. Was not placed on anticoag.   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   Metastasis to lung ([place]) dx'd 01/2007   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   lt submandibular ln   Pain in joint, pelvic region and thigh 08/01/2013   PSVT (paroxysmal supraventricular tachycardia) (HCC)    SCC (squamous cell carcinoma)    Left cheek    Past Surgical History:  Past Surgical History:  Procedure Laterality Date   CHOLECYSTECTOMY     EXPLORATORY LAPAROTOMY     EYE SURGERY Right 2016   cataract   LEFT HEART CATH AND CORONARY ANGIOGRAPHY N/A 06/05/2019   Procedure: LEFT HEART CATH AND CORONARY ANGIOGRAPHY;  Surgeon: Jettie Booze, MD;  Location: [facility];  Service: Cardiovascular;  Laterality: N/A;   LUNG LOBECTOMY     right side   LYMPH NODE BIOPSY     in groin with removal   MENISCUS REPAIR     right knee   MOHS SURGERY Left 09/2020   cheek   PROSTATE SURGERY     REFRACTIVE SURGERY Right    piece of metal removed   SUBMANDIBULAR GLAND EXCISION  04/2011   SUBMANDIBULAR GLAND EXCISION  04/30/2011   Procedure: EXCISION  SUBMANDIBULAR GLAND;  Surgeon: Jerrell Belfast, MD;  Location: [facility] OR;  Service: ENT;  Laterality: Left;  WITH DIAGNOSTIC BIOPSY   TONSILLECTOMY     as a child   [place]     right leg    Allergies:  No Known Allergies  Family History:  Family History  Problem Relation Age of Onset   Heart disease Father    Heart attack Father        x 3   Cancer Father    Cancer Sister        breast ca   Cancer Brother        prostate ca   Heart attack Sister    Cancer Sister        breast   Heart disease Brother    Alzheimer's disease Sister    Diabetes Sister    Heart disease Brother     Diabetes Brother    Heart disease Brother    Heart disease Brother    Heart disease Brother    Heart disease Brother    Heart disease Brother    Alzheimer's disease Brother    Diabetes Son    Cancer Brother    Anesthesia problems Neg Hx     Social History:  Social History   Tobacco Use   Smoking status: Former    Packs/day: 1.00    Years: 15.00    Pack years: 15.00    Types: Cigarettes    Start date: 12/12/1962    Quit date: 08/25/1984    Years since quitting: 36.5   Smokeless tobacco: Never  Vaping Use   Vaping Use: Never used  Substance Use Topics   Alcohol use: Not Currently   Drug use: Never    Review of symptoms:  Constitutional:  Negative for unexplained weight loss, night sweats, fever, chills ENT:  Negative for nose bleeds, sinus pain, painful swallowing CV:  Negative for chest pain, shortness of breath, exercise intolerance, palpitations, loss of consciousness Resp:  Negative for cough, wheezing, shortness of breath GI:  Negative for nausea, vomiting, diarrhea, bloody stools GU:  Positives noted in HPI; otherwise negative for gross hematuria, dysuria Neuro:  Negative for seizures, poor balance, limb weakness, slurred speech Psych:  Negative for lack of energy, depression, anxiety Endocrine:  Negative for polydipsia, polyuria, symptoms of hypoglycemia (dizziness, hunger, sweating) Hematologic:  Negative for anemia, purpura, petechia, prolonged or excessive bleeding, use of anticoagulants  Allergic:  Negative for difficulty breathing or choking as a result of exposure to anything; no shellfish allergy; no allergic response (rash/itch) to materials, foods  Physical exam: BP 128/77    Pulse 74    Wt 185 lb (83.9 kg)    BMI 25.09 kg/m  GENERAL APPEARANCE:  Well appearing, well developed, well nourished, NAD HEENT: Atraumatic, Normocephalic, oropharynx clear. NECK: Supple without lymphadenopathy or thyromegaly. LUNGS: Clear to auscultation bilaterally. HEART:  Regular Rate and Rhythm without murmurs, gallops, or rubs. ABDOMEN: Soft, non-tender, No Masses. EXTREMITIES: Moves all extremities well.  Without clubbing, cyanosis, or edema. NEUROLOGIC:  Alert and oriented x 3, normal gait, CN II-XII grossly intact.  MENTAL STATUS:  Appropriate. BACK:  Non-tender to palpation.  No CVAT SKIN:  Warm, dry and intact.   GU: Penis:  uncircumcised Meatus: Normal Scrotum: normal, no masses Testis: normal without masses bilateral Epididymis: normal Prostate: 40 g, NT, no nodules Rectum: Normal tone,  no masses or tenderness   Results: U/A dipstick negative  PVR:  250 ml

## 2021-03-03 ENCOUNTER — Other Ambulatory Visit: Payer: Self-pay | Admitting: Cardiology

## 2021-03-03 ENCOUNTER — Encounter: Payer: Self-pay | Admitting: Urology

## 2021-03-03 ENCOUNTER — Telehealth: Payer: Self-pay | Admitting: Cardiology

## 2021-03-03 LAB — PSA: Prostate Specific Ag, Serum: 0.7 ng/mL (ref 0.0–4.0)

## 2021-03-03 NOTE — Telephone Encounter (Signed)
Alfuzosin when used with flecainide can increase the risk of QTc prolongation. Last QTc reported on EKD was 426. If combo is used, close monitoring will be needed. Patient has already tried flomax and terazosin without benefit.  Im not sure who his new EP Dr. Is after Dr. Rayann Heman change to prn. I will defer final decision to Dr. Harl Bowie.

## 2021-03-03 NOTE — Telephone Encounter (Signed)
New Message:  [name] called. She said the patient was put on a new medicine(Alfuzosin ER 10 mg) by one of his other doctors. When she went to pick up his medicine at the pharmacist , the pharmacist said there was an interaction between his Flecainide and the Alfuzosin ER. She was told it could cause dangerous Arrhythmia.    Pt c/o medication issue:  1. Name of Medication: Flecainide  2. How are you currently taking this medication (dosage and times per day)?  2 times a day  3. Are you having a reaction (difficulty breathing--STAT)? No  4. What is your medication issue? There is an interaction between Flecainide and new medicine Alfuzosin- she wants to know if there is something else patient can take in the place of Flecainide?

## 2021-03-03 NOTE — Telephone Encounter (Signed)
Not easy to change from flecanide. The other medication options would require a 3 day hospital stay to initiate or involve a medication with multiple potential significant side effects. I think easier option would be to discuss with the doctor who started alfuzosin an alternative.  Zandra Abts MD

## 2021-03-04 ENCOUNTER — Encounter: Payer: Self-pay | Admitting: Hematology and Oncology

## 2021-03-05 ENCOUNTER — Other Ambulatory Visit: Payer: Self-pay | Admitting: Urology

## 2021-03-05 ENCOUNTER — Telehealth: Payer: Self-pay | Admitting: Cardiology

## 2021-03-05 ENCOUNTER — Telehealth: Payer: Self-pay

## 2021-03-05 DIAGNOSIS — N138 Other obstructive and reflux uropathy: Secondary | ICD-10-CM

## 2021-03-05 MED ORDER — SILODOSIN 8 MG PO CAPS: 8.0000 mg | ORAL_CAPSULE | Freq: Every day | ORAL | 11 refills | Status: DC

## 2021-03-05 NOTE — Telephone Encounter (Signed)
Wife called stating patient's heart doctor and pharmacist states patient should not be taking alfuzosin and flecainide together due to the increased risk or severity of QTc prolongation. Patient request another alternative to the alfuzosin.

## 2021-03-05 NOTE — Telephone Encounter (Signed)
Patient notified and verbalized understanding. 

## 2021-03-05 NOTE — Telephone Encounter (Signed)
Patient called and made aware.

## 2021-03-05 NOTE — Telephone Encounter (Signed)
No interactions are shown with flecanide. How low are bp's going? Any symptoms?  Zandra Abts MD

## 2021-03-05 NOTE — Telephone Encounter (Signed)
° °  Pt c/o medication issue:  1. Name of Medication: silodosin 8mg    2. How are you currently taking this medication (dosage and times per day)?   3. Are you having a reaction (difficulty breathing--STAT)?   4. What is your medication issue? Pt's wife calling back, she said Dr. Felipa Eth prescribed a different medication silodosin 8mg  and wanted to know if this is safe for pt to take, it says it can lower his BP and pt been having low BP now

## 2021-03-06 NOTE — Telephone Encounter (Signed)
Patient & wife Anthony Kelly) notified.  States his BP's are running 104/60's - 115/65 & HR running 60-70's.  No c/o any symptoms.

## 2021-03-09 DIAGNOSIS — C8290 Follicular lymphoma, unspecified, unspecified site: Secondary | ICD-10-CM | POA: Diagnosis not present

## 2021-03-17 ENCOUNTER — Ambulatory Visit: Payer: Medicare Other | Admitting: Urology

## 2021-03-25 ENCOUNTER — Encounter: Payer: Self-pay | Admitting: Hematology and Oncology

## 2021-03-25 DIAGNOSIS — C8280 Other types of follicular lymphoma, unspecified site: Secondary | ICD-10-CM | POA: Diagnosis not present

## 2021-03-25 DIAGNOSIS — C8290 Follicular lymphoma, unspecified, unspecified site: Secondary | ICD-10-CM | POA: Diagnosis not present

## 2021-03-25 DIAGNOSIS — C8200 Follicular lymphoma grade I, unspecified site: Secondary | ICD-10-CM | POA: Diagnosis not present

## 2021-03-27 ENCOUNTER — Other Ambulatory Visit: Payer: Self-pay | Admitting: Hematology and Oncology

## 2021-03-27 ENCOUNTER — Telehealth: Payer: Self-pay

## 2021-03-27 ENCOUNTER — Encounter: Payer: Self-pay | Admitting: Hematology and Oncology

## 2021-03-27 DIAGNOSIS — C8280 Other types of follicular lymphoma, unspecified site: Secondary | ICD-10-CM

## 2021-03-27 NOTE — Telephone Encounter (Signed)
I palced orders for CT and LOS ?Please tell her to call CT at least budget 2 hours after labs at [facility] since he has to drink the contrast ?

## 2021-03-27 NOTE — Telephone Encounter (Signed)
Called and spoke with [name] to see when they wanted CT scan appt for Anthony Kelly. ? ?She is asking for 4/18 for CT with labs and see Dr. Alvy Bimler on 4/20. ?

## 2021-03-30 ENCOUNTER — Telehealth: Payer: Self-pay | Admitting: Hematology and Oncology

## 2021-03-30 NOTE — Telephone Encounter (Signed)
Scheduled appointment per inbasket message. Left message.  ? ?

## 2021-04-01 ENCOUNTER — Ambulatory Visit (INDEPENDENT_AMBULATORY_CARE_PROVIDER_SITE_OTHER): Payer: Medicare Other | Admitting: Family Medicine

## 2021-04-01 ENCOUNTER — Encounter: Payer: Self-pay | Admitting: Family Medicine

## 2021-04-01 VITALS — BP 97/63 | HR 62 | Ht 72.0 in | Wt 192.0 lb

## 2021-04-01 DIAGNOSIS — I2510 Atherosclerotic heart disease of native coronary artery without angina pectoris: Secondary | ICD-10-CM | POA: Diagnosis not present

## 2021-04-01 DIAGNOSIS — E782 Mixed hyperlipidemia: Secondary | ICD-10-CM

## 2021-04-01 DIAGNOSIS — K219 Gastro-esophageal reflux disease without esophagitis: Secondary | ICD-10-CM

## 2021-04-01 DIAGNOSIS — I480 Paroxysmal atrial fibrillation: Secondary | ICD-10-CM | POA: Diagnosis not present

## 2021-04-01 DIAGNOSIS — R7303 Prediabetes: Secondary | ICD-10-CM

## 2021-04-01 LAB — LIPID PANEL
Chol/HDL Ratio: 2.1 ratio (ref 0.0–5.0)
Cholesterol, Total: 96 mg/dL — ABNORMAL LOW (ref 100–199)
HDL: 45 mg/dL (ref >39)
LDL Chol Calc (NIH): 32 mg/dL (ref 0–99)
Triglycerides: 104 mg/dL (ref 0–149)
VLDL Cholesterol Cal: 19 mg/dL (ref 5–40)

## 2021-04-01 LAB — BAYER DCA HB A1C WAIVED: HB A1C (BAYER DCA - WAIVED): 6.2 % — ABNORMAL HIGH (ref 4.8–5.6)

## 2021-04-01 NOTE — Progress Notes (Signed)
? ?BP 97/63   Pulse 62   Ht 6' (1.829 m)   Wt 192 lb (87.1 kg)   SpO2 95%   BMI 26.04 kg/m?   ? ?Subjective:  ? ?Patient ID: Anthony Kelly, male    DOB: [birth year], 76 y.o.   MRN: [id number] ? ?HPI: ?Anthony Kelly is a 76 y.o. male presenting on 04/01/2021 for Medical Management of Chronic Issues and Hyperlipidemia ? ? ?HPI ?Hyperlipidemia ?Patient is coming in for recheck of his hyperlipidemia. The patient is currently taking Crestor. They deny any issues with myalgias or history of liver damage from it. They deny any focal numbness or weakness or chest pain.  ? ?Patient is going to see a different oncologist at [facility] because his previous treatment was not working and they are going to try something new. ? ?Prediabetes ?Patient comes in today for recheck of his diabetes. Patient has been currently taking no medication, diet control. Patient is not currently on an ACE inhibitor/ARB. Patient has not seen an ophthalmologist this year. Patient denies any issues with their feet. The symptom started onset as an adult hyperlipidemia and GERD ARE RELATED TO DM  ? ?A-fib recheck ?Patient is coming in for A-fib recheck.  He denies major palpitations or flutters and says its been staying steady.  He does take Eliquis.  He denies any chest pain or shortness of breath. ? ?GERD ?Patient is currently on omeprazole.  She denies any major symptoms or abdominal pain or belching or burping. She denies any blood in her stool or lightheadedness or dizziness.  ? ?Relevant past medical, surgical, family and social history reviewed and updated as indicated. Interim medical history since our last visit reviewed. ?Allergies and medications reviewed and updated. ? ?Review of Systems  ?Constitutional:  Negative for chills and fever.  ?Eyes:  Negative for visual disturbance.  ?Respiratory:  Negative for shortness of breath and wheezing.   ?Cardiovascular:  Negative for chest pain and leg swelling.  ?Musculoskeletal:  Negative for  back pain and gait problem.  ?Skin:  Negative for rash.  ?Neurological:  Negative for dizziness, weakness and light-headedness.  ?All other systems reviewed and are negative. ? ?Per HPI unless specifically indicated above ? ? ?Allergies as of 04/01/2021   ?No Known Allergies ?  ? ?  ?Medication List  ?  ? ?  ? Accurate as of April 01, 2021 11:11 AM. If you have any questions, ask your nurse or doctor.  ?  ?  ? ?  ? ?STOP taking these medications   ? ?PROBIOTIC DAILY PO ?Stopped by: Worthy Rancher, MD ?  ? ?  ? ?TAKE these medications   ? ?brimonidine 0.2 % ophthalmic solution ?Commonly known as: ALPHAGAN ?Place 1 drop into both eyes 2 (two) times daily. ?  ?cholecalciferol 25 MCG (1000 UNIT) tablet ?Commonly known as: VITAMIN D3 ?Take 1,000 Units by mouth daily. ?  ?Eliquis 5 MG Tabs tablet ?Generic drug: apixaban ?TAKE 1 TABLET TWICE A DAY ?  ?flecainide 50 MG tablet ?Commonly known as: TAMBOCOR ?Take 1 tablet (50 mg total) by mouth 2 (two) times daily. ?  ?Lumigan 0.01 % Soln ?Generic drug: bimatoprost ?Place 1 drop into both eyes daily. ?  ?meclizine 25 MG tablet ?Commonly known as: ANTIVERT ?Take 1 tablet (25 mg total) by mouth 3 (three) times daily as needed for dizziness. ?  ?metoprolol succinate 25 MG 24 hr tablet ?Commonly known as: TOPROL-XL ?TAKE ONE-HALF (1/2) TABLET DAILY ?  ?multivitamin with minerals  Tabs tablet ?Take 1 tablet by mouth daily. ?  ?omeprazole 20 MG capsule ?Commonly known as: PRILOSEC ?Take 1 capsule (20 mg total) by mouth daily. ?  ?rosuvastatin 20 MG tablet ?Commonly known as: CRESTOR ?TAKE 1 TABLET DAILY ?  ?silodosin 8 MG Caps capsule ?Commonly known as: RAPAFLO ?Take 1 capsule (8 mg total) by mouth daily with breakfast. ?  ?vitamin C 1000 MG tablet ?  ?Xiidra 5 % Soln ?Generic drug: Lifitegrast ?Place 1 drop into both eyes daily. ?  ? ?  ? ? ? ?Objective:  ? ?BP 97/63   Pulse 62   Ht 6' (1.829 m)   Wt 192 lb (87.1 kg)   SpO2 95%   BMI 26.04 kg/m?   ?Wt Readings from Last 3  Encounters:  ?04/01/21 192 lb (87.1 kg)  ?03/02/21 185 lb (83.9 kg)  ?02/25/21 183 lb (83 kg)  ?  ?Physical Exam ?Vitals and nursing note reviewed.  ?Constitutional:   ?   General: He is not in acute distress. ?   Appearance: He is well-developed. He is not diaphoretic.  ?Eyes:  ?   General: No scleral icterus. ?   Conjunctiva/sclera: Conjunctivae normal.  ?Neck:  ?   Thyroid: No thyromegaly.  ?Cardiovascular:  ?   Rate and Rhythm: Normal rate and regular rhythm.  ?   Heart sounds: Normal heart sounds. No murmur heard. ?Pulmonary:  ?   Effort: Pulmonary effort is normal. No respiratory distress.  ?   Breath sounds: Normal breath sounds. No wheezing.  ?Musculoskeletal:     ?   General: Normal range of motion.  ?   Cervical back: Neck supple.  ?Lymphadenopathy:  ?   Cervical: No cervical adenopathy.  ?Skin: ?   General: Skin is warm and dry.  ?   Findings: No rash.  ?Neurological:  ?   Mental Status: He is alert and oriented to person, place, and time.  ?   Coordination: Coordination normal.  ?Psychiatric:     ?   Behavior: Behavior normal.  ? ? ? ? ?Assessment & Plan:  ? ?Problem List Items Addressed This Visit   ? ?  ? Cardiovascular and Mediastinum  ? PAF (paroxysmal atrial fibrillation) (HCC) (Chronic)  ?  ? Digestive  ? GERD (gastroesophageal reflux disease)  ?  ? Other  ? Hyperlipidemia - Primary  ? Relevant Orders  ? Lipid panel  ? Prediabetes  ? Relevant Orders  ? Bayer DCA Hb A1c Waived  ?Continue current medicine, will check A1c and cholesterol.  Blood pressure looks good today.  He is a little on the lower side but denies any lightheadedness or dizziness. ? ?Follow up plan: ?Return in about 6 months (around 10/02/2021), or if symptoms worsen or fail to improve, for Hyperlipidemia and prediabetes. ? ?Counseling provided for all of the vaccine components ?Orders Placed This Encounter  ?Procedures  ? Bayer DCA Hb A1c Waived  ? Lipid panel  ? ? ?Caryl Pina, MD ?[place] ?04/01/2021, 11:11 AM ? ? ? ? ?

## 2021-04-02 ENCOUNTER — Telehealth: Payer: Self-pay

## 2021-04-02 NOTE — Telephone Encounter (Signed)
Patients wife called to ask if the apt on Wednesday is completely necessary?  She states that since taking the Silodosin the patient is doing great and is not having any issues. Please advise. ?

## 2021-04-03 NOTE — Telephone Encounter (Signed)
Spoke to wife, pt has too many future apts and would like to just keep the scheduled visit on Wednesday. ?

## 2021-04-08 ENCOUNTER — Encounter: Payer: Self-pay | Admitting: Urology

## 2021-04-08 ENCOUNTER — Other Ambulatory Visit: Payer: Self-pay

## 2021-04-08 ENCOUNTER — Ambulatory Visit (INDEPENDENT_AMBULATORY_CARE_PROVIDER_SITE_OTHER): Payer: Medicare Other | Admitting: Urology

## 2021-04-08 VITALS — BP 115/76 | HR 64

## 2021-04-08 DIAGNOSIS — N401 Enlarged prostate with lower urinary tract symptoms: Secondary | ICD-10-CM

## 2021-04-08 DIAGNOSIS — R339 Retention of urine, unspecified: Secondary | ICD-10-CM | POA: Diagnosis not present

## 2021-04-08 DIAGNOSIS — N138 Other obstructive and reflux uropathy: Secondary | ICD-10-CM | POA: Diagnosis not present

## 2021-04-08 LAB — URINALYSIS, ROUTINE W REFLEX MICROSCOPIC
Bilirubin, UA: NEGATIVE
Glucose, UA: NEGATIVE
Ketones, UA: NEGATIVE
Leukocytes,UA: NEGATIVE
Nitrite, UA: NEGATIVE
Protein,UA: NEGATIVE
RBC, UA: NEGATIVE
Specific Gravity, UA: 1.015 (ref 1.005–1.030)
Urobilinogen, Ur: 0.2 mg/dL (ref 0.2–1.0)
pH, UA: 6 (ref 5.0–7.5)

## 2021-04-08 LAB — BLADDER SCAN AMB NON-IMAGING: Scan Result: >247

## 2021-04-08 MED ORDER — SILODOSIN 8 MG PO CAPS: 8.0000 mg | ORAL_CAPSULE | Freq: Every day | ORAL | 3 refills | Status: DC

## 2021-04-08 NOTE — Progress Notes (Signed)
? ?Assessment: ?1. BPH with obstruction/lower urinary tract symptoms   ?2. Incomplete bladder emptying   ? ?Plan: ?Continue silodosin 8 mg daily. ?Return to office in 3 months ? ?Chief Complaint:  ?Chief Complaint  ?Patient presents with  ? Benign Prostatic Hypertrophy  ? ? ?History of Present Illness: ? ?Anthony Kelly is a 76 y.o. year old male who is seen for further evaluation of BPH with obstruction.  He has a long history of BPH with lower urinary tract symptoms.  He underwent a TUNA procedure approximately 15 years ago in [place].  He noted improvement in his symptoms following this.  He has had gradual worsening of his urinary symptoms over the past few years.  He currently reports daytime frequency, voiding every 1-2 hours, nocturia 5-8 times, urgency, decreased force of stream, and postvoid dribbling.  He voids with a slow stream.  No dysuria or gross hematuria.  No history of UTIs.  He currently takes terazosin 2 mg nightly.  He has previously tried tamsulosin without benefit. ?IPSS = 30  Pvr = 250 ml ?CT imaging from 01/29/2021 showed left renal cyst, no hydronephrosis, normal-appearing bladder, and enlargement of the prostate with nodular protrusion into the bladder base. ? ?PSA from 3/21: 1.4 with 30% free ?PSA from 2/23:  0.7 ? ?At his visit in 2/23, he was changed from terazosin to silodosin.  He noted improvement in his urinary symptoms with the medication change.  He reports an improved urine stream with improved emptying.  He continues with some frequency due to his water intake.  His nocturia has decreased to 2 times per night.  No dysuria or gross hematuria.  No side effects from the medication. ?IPSS = 10 today. ? ?Portions of the above documentation were copied from a prior visit for review purposes only. ? ? ?Past Medical History:  ?Past Medical History:  ?Diagnosis Date  ? 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 Acute pericarditis   ? a. 04/2013 -adm with CP, elevated CRP. H/o coronary artery  calcification on prior CT but nuc was negative, EF 71%.  ? Arthritis   ? Atrial fibrillation ([place])   ? a. Isolated episode in the setting of acute pericarditis 04/2013. Was not placed on anticoag.  ? BPH (benign prostatic hyperplasia)   ? Cataract   ? Diverticulitis 08/16/2013  ? Emphysema of lung ([place]) 2005  ? GERD (gastroesophageal reflux disease) 2013  ? Contolled with omeprazole  ? Glaucoma   ? Hyperlipidemia   ? elevated triglycerides  ? 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 Initial dx 6/04 left inguinal adenopathy Rx observation; convert to hi grade 11/05 Rx CHOP-R; lesion right lung resected 12/08: low grade NHL; new lesion left submandibular gland 2/13  resected 04/30/11 lo grade NHL  ? 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 lt submandibular ln  ? Pain in joint, pelvic region and thigh 08/01/2013  ? PSVT (paroxysmal supraventricular tachycardia) ([place])   ? 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 ? ?Past Surgical History:  ?Past Surgical History:  ?Procedure Laterality Date  ? CHOLECYSTECTOMY    ? EXPLORATORY LAPAROTOMY    ? EYE SURGERY Right 2016  ? cataract  ? LEFT HEART CATH AND CORONARY ANGIOGRAPHY N/A 06/05/2019  ? Procedure: LEFT HEART CATH AND CORONARY ANGIOGRAPHY;  Surgeon: Jettie Booze, MD;  Location: [facility];  Service:  Cardiovascular;  Laterality: N/A;  ? LUNG LOBECTOMY    ? right side  ? LYMPH NODE BIOPSY    ? in groin with removal  ? MENISCUS REPAIR    ? right knee  ? MOHS SURGERY Left 09/2020  ? cheek  ? PROSTATE SURGERY    ? REFRACTIVE SURGERY Right   ? piece of metal removed  ? SUBMANDIBULAR GLAND EXCISION  04/2011  ? SUBMANDIBULAR GLAND EXCISION  04/30/2011  ? Procedure: EXCISION SUBMANDIBULAR GLAND;  Surgeon: Jerrell Belfast, MD;  Location: [facility];  Service: ENT;  Laterality: Left;  WITH DIAGNOSTIC BIOPSY  ? TONSILLECTOMY    ? as a child  ? VEIN LIGATION AND  STRIPPING    ? right leg  ? ? ?Allergies:  ?No Known Allergies ? ?Family History:  ?Family History  ?Problem Relation Age of Onset  ? Heart disease Father   ? Heart attack Father   ?     x 3  ? Cancer Father   ? Cancer Sister   ?     breast ca  ? Cancer Brother   ?     prostate ca  ? Heart attack Sister   ? Cancer Sister   ?     breast  ? Heart disease Brother   ? Alzheimer's disease Sister   ? Diabetes Sister   ? Heart disease Brother   ? Diabetes Brother   ? Heart disease Brother   ? Heart disease Brother   ? Heart disease Brother   ? Heart disease Brother   ? Heart disease Brother   ? Alzheimer's disease Brother   ? Diabetes Son   ? Cancer Brother   ? Anesthesia problems Neg Hx   ? ? ?Social History:  ?Social History  ? ?Tobacco Use  ? Smoking status: Former  ?  Packs/day: 1.00  ?  Years: 15.00  ?  Pack years: 15.00  ?  Types: Cigarettes  ?  Start date: 12/12/1962  ?  Quit date: 08/25/1984  ?  Years since quitting: 36.6  ? Smokeless tobacco: Never  ?Vaping Use  ? Vaping Use: Never used  ?Substance Use Topics  ? Alcohol use: Not Currently  ? Drug use: Never  ? ? ?ROS: ?Constitutional:  Negative for fever, chills, weight loss ?CV: Negative for chest pain, previous MI, hypertension ?Respiratory:  Negative for shortness of breath, wheezing, sleep apnea, frequent cough ?GI:  Negative for nausea, vomiting, bloody stool, GERD ? ?Physical exam: ?BP 115/76   Pulse 64  ?GENERAL APPEARANCE:  Well appearing, well developed, well nourished, NAD ?HEENT:  Atraumatic, normocephalic, oropharynx clear ?NECK:  Supple without lymphadenopathy or thyromegaly ?ABDOMEN:  Soft, non-tender, no masses ?EXTREMITIES:  Moves all extremities well, without clubbing, cyanosis, or edema ?NEUROLOGIC:  Alert and oriented x 3, normal gait, CN II-XII grossly intact ?MENTAL STATUS:  appropriate ?BACK:  Non-tender to palpation, No CVAT ?SKIN:  Warm, dry, and intact ? ? ?Results: ?U/A dipstick negative ? ?PVR: 247 ml  ?

## 2021-04-22 DIAGNOSIS — H401131 Primary open-angle glaucoma, bilateral, mild stage: Secondary | ICD-10-CM | POA: Diagnosis not present

## 2021-04-28 ENCOUNTER — Ambulatory Visit (HOSPITAL_COMMUNITY)
Admission: RE | Admit: 2021-04-28 | Discharge: 2021-04-28 | Disposition: A | Discharge status: Home / Self Care | Payer: Medicare Other | Source: Ambulatory Visit | Attending: Hematology and Oncology | Admitting: Hematology and Oncology

## 2021-04-28 ENCOUNTER — Inpatient Hospital Stay: Payer: Medicare Other | Attending: Hematology and Oncology

## 2021-04-28 ENCOUNTER — Other Ambulatory Visit: Payer: Self-pay

## 2021-04-28 ENCOUNTER — Encounter (HOSPITAL_COMMUNITY): Payer: Self-pay

## 2021-04-28 DIAGNOSIS — K7689 Other specified diseases of liver: Secondary | ICD-10-CM | POA: Diagnosis not present

## 2021-04-28 DIAGNOSIS — I2510 Atherosclerotic heart disease of native coronary artery without angina pectoris: Secondary | ICD-10-CM | POA: Diagnosis not present

## 2021-04-28 DIAGNOSIS — C8280 Other types of follicular lymphoma, unspecified site: Secondary | ICD-10-CM | POA: Diagnosis not present

## 2021-04-28 DIAGNOSIS — R590 Localized enlarged lymph nodes: Secondary | ICD-10-CM | POA: Diagnosis not present

## 2021-04-28 DIAGNOSIS — I700 Atherosclerosis of aorta: Secondary | ICD-10-CM | POA: Diagnosis not present

## 2021-04-28 DIAGNOSIS — Z79899 Other long term (current) drug therapy: Secondary | ICD-10-CM | POA: Insufficient documentation

## 2021-04-28 DIAGNOSIS — K449 Diaphragmatic hernia without obstruction or gangrene: Secondary | ICD-10-CM | POA: Diagnosis not present

## 2021-04-28 DIAGNOSIS — C8298 Follicular lymphoma, unspecified, lymph nodes of multiple sites: Secondary | ICD-10-CM | POA: Insufficient documentation

## 2021-04-28 DIAGNOSIS — Z7901 Long term (current) use of anticoagulants: Secondary | ICD-10-CM | POA: Insufficient documentation

## 2021-04-28 LAB — COMPREHENSIVE METABOLIC PANEL
ALT: 15 U/L (ref 0–44)
AST: 18 U/L (ref 15–41)
Albumin: 4.2 g/dL (ref 3.5–5.0)
Alkaline Phosphatase: 50 U/L (ref 38–126)
Anion gap: 7 (ref 5–15)
BUN: 16 mg/dL (ref 8–23)
CO2: 29 mmol/L (ref 22–32)
Calcium: 9.3 mg/dL (ref 8.9–10.3)
Chloride: 105 mmol/L (ref 98–111)
Creatinine, Ser: 1.07 mg/dL (ref 0.61–1.24)
GFR, Estimated: >60 mL/min (ref >60)
Glucose, Bld: 75 mg/dL (ref 70–99)
Potassium: 4.4 mmol/L (ref 3.5–5.1)
Sodium: 141 mmol/L (ref 135–145)
Total Bilirubin: 0.5 mg/dL (ref 0.3–1.2)
Total Protein: 6.4 g/dL — ABNORMAL LOW (ref 6.5–8.1)

## 2021-04-28 LAB — CBC WITH DIFFERENTIAL/PLATELET
Abs Immature Granulocytes: 0.02 10*3/uL (ref 0.00–0.07)
Basophils Absolute: 0.1 10*3/uL (ref 0.0–0.1)
Basophils Relative: 1 %
Eosinophils Absolute: 0.4 10*3/uL (ref 0.0–0.5)
Eosinophils Relative: 5 %
HCT: 47.4 % (ref 39.0–52.0)
Hemoglobin: 16.1 g/dL (ref 13.0–17.0)
Immature Granulocytes: 0 %
Lymphocytes Relative: 21 %
Lymphs Abs: 1.7 10*3/uL (ref 0.7–4.0)
MCH: 29.8 pg (ref 26.0–34.0)
MCHC: 34 g/dL (ref 30.0–36.0)
MCV: 87.8 fL (ref 80.0–100.0)
Monocytes Absolute: 0.8 10*3/uL (ref 0.1–1.0)
Monocytes Relative: 10 %
Neutro Abs: 5.4 10*3/uL (ref 1.7–7.7)
Neutrophils Relative %: 63 %
Platelets: 177 10*3/uL (ref 150–400)
RBC: 5.4 MIL/uL (ref 4.22–5.81)
RDW: 13.3 % (ref 11.5–15.5)
WBC: 8.5 10*3/uL (ref 4.0–10.5)
nRBC: 0 % (ref 0.0–0.2)

## 2021-04-28 IMAGING — CT CT CHEST-ABD-PELV W/ CM
3 of 5 series · 14 of 36 positions shown, 16 images · IV contrast (OMNIPAQUE)
Comparison: [DATE]

CLINICAL DATA: Hematological malignancy. Chemotherapy complete. Or
therapy complete. * Tracking Code: BO *

EXAM:
CT CHEST, ABDOMEN, AND PELVIS WITH CONTRAST
TECHNIQUE: Multidetector CT imaging of the chest, abdomen and pelvis was
performed following the standard protocol during bolus
administration of intravenous contrast.

[Series 2: cap with · axial · 0.82mm/px · z∈[-615,-75]mm · 9 of 136 slices shown, 11 images]
[im 14/136  mediastinal]
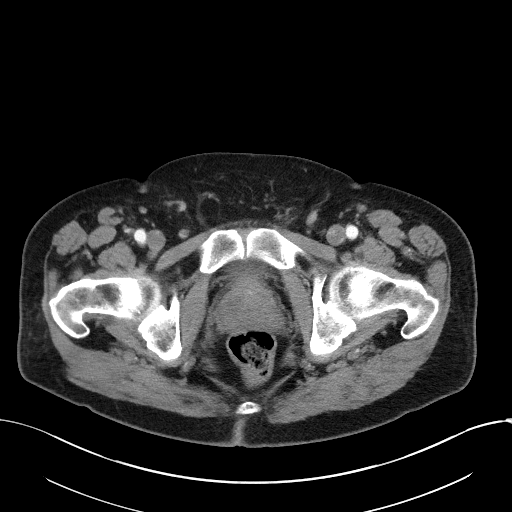
[im 14/136  bone]
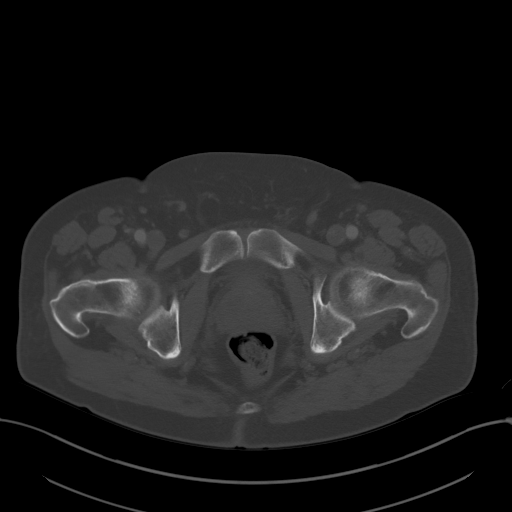
[im 28/136  mediastinal]
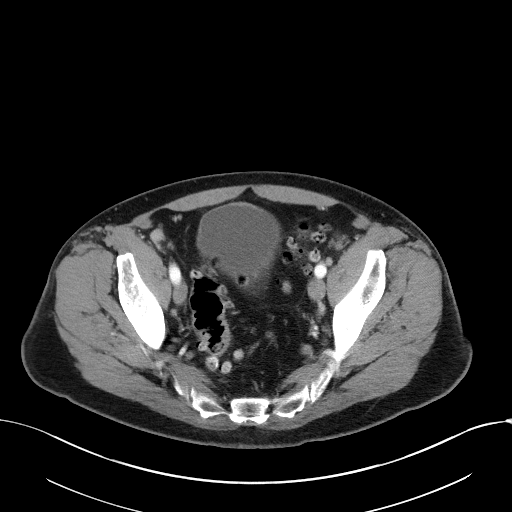
[im 41/136  mediastinal]
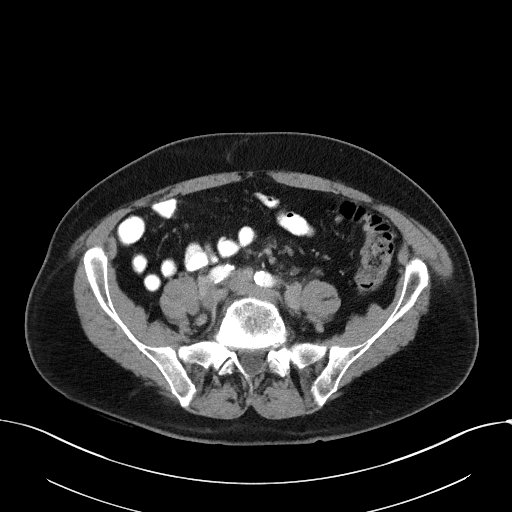
[im 55/136  mediastinal]
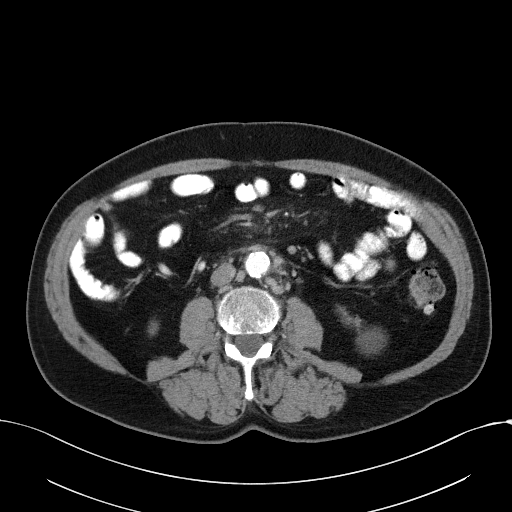
[im 68/136  mediastinal]
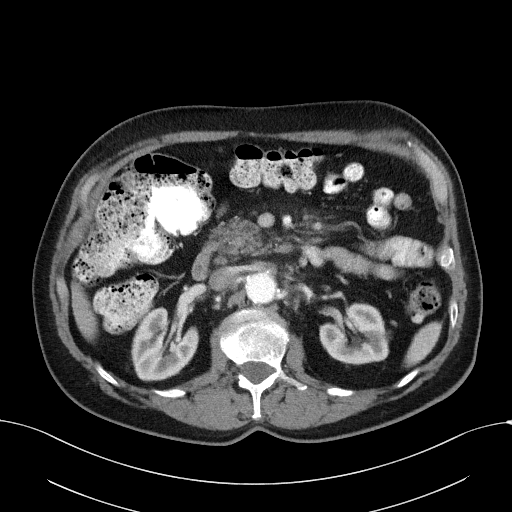
[im 82/136  mediastinal]
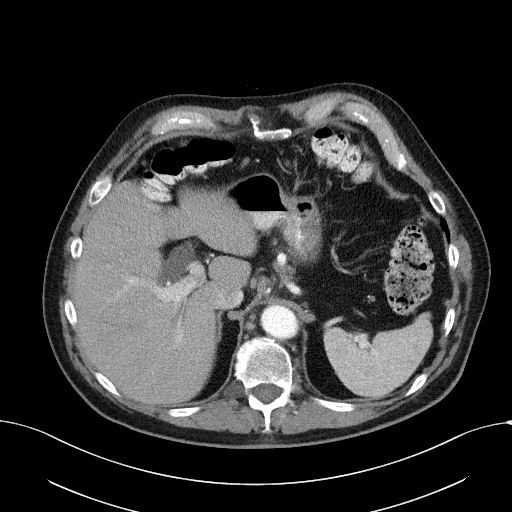
[im 95/136  mediastinal]
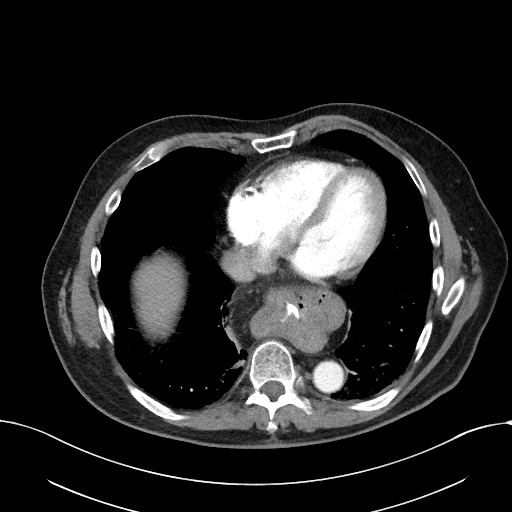
[im 109/136  mediastinal]
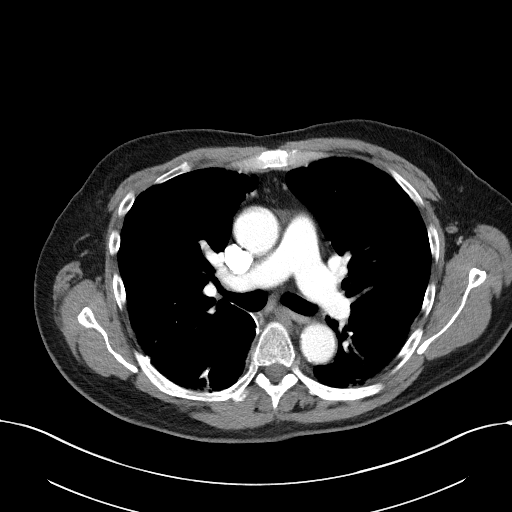
[im 122/136  mediastinal]
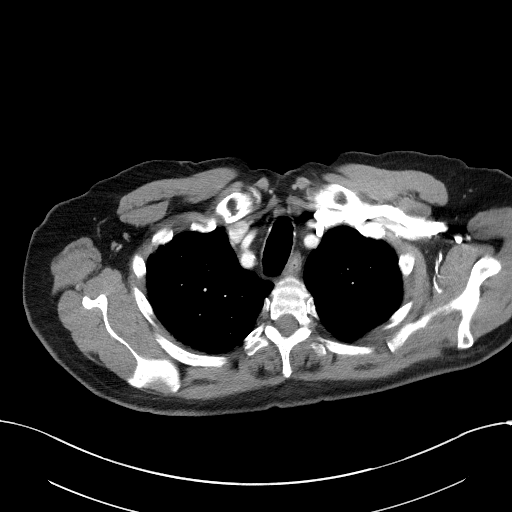
[im 122/136  bone]
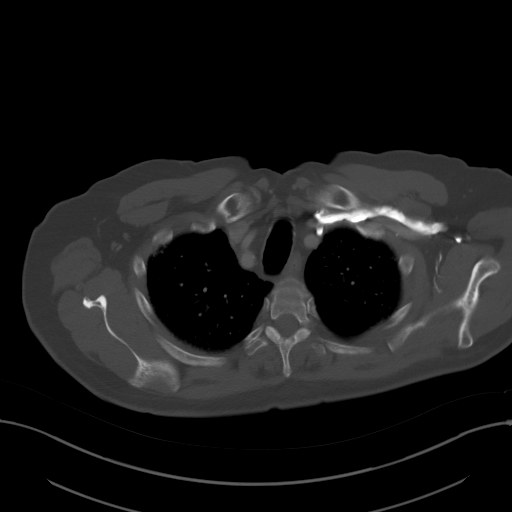

[Series 4: coronals · coronal · 0.86mm/px · 3 of 147 slices shown]
[im 30/147  mediastinal]
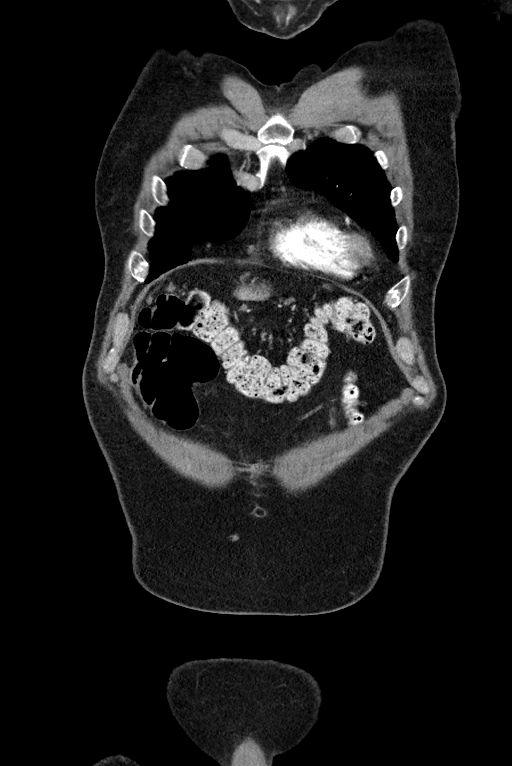
[im 59/147  mediastinal]
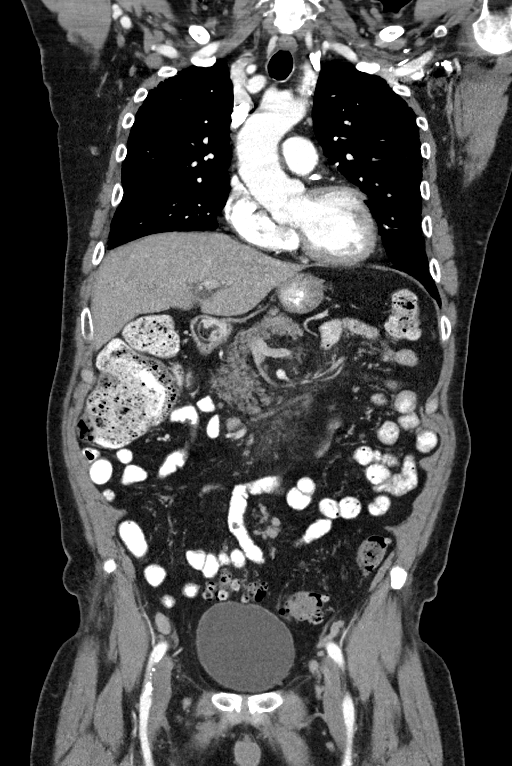
[im 88/147  mediastinal]
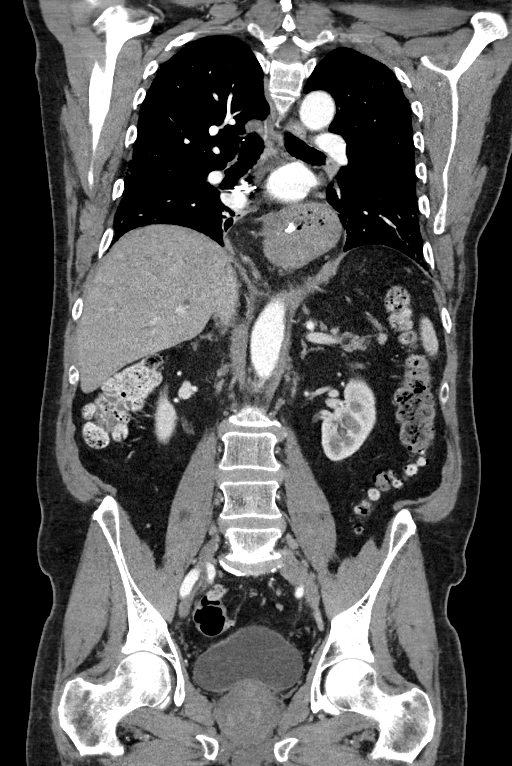

[Series 6: lung · axial · 0.82mm/px · z∈[-279,-229]mm · 2 of 150 slices shown]
[im 13/150  bone]
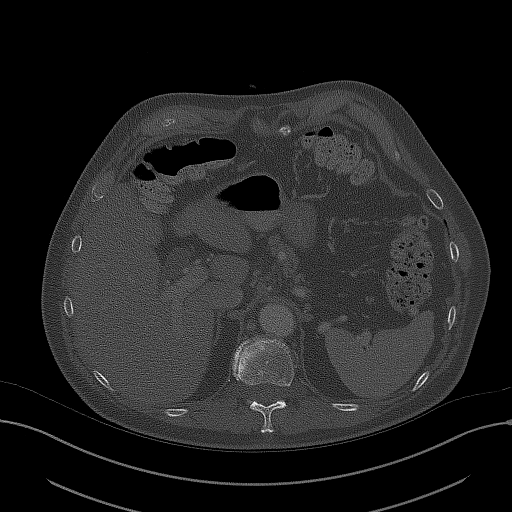
[im 38/150  bone]
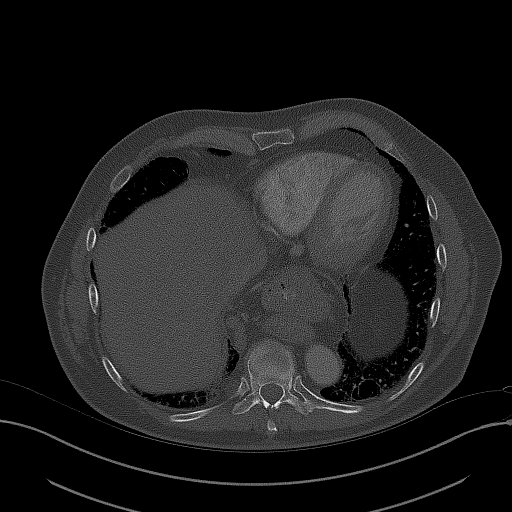

[14 of 36 positions shown; findings below may reference images not displayed]

RADIATION DOSE REDUCTION: This exam was performed according to the
departmental dose-optimization program which includes automated
exposure control, adjustment of the mA and/or kV according to
patient size and/or use of iterative reconstruction technique.

CONTRAST:  100mL OMNIPAQUE IOHEXOL 300 MG/ML  SOLN
FINDINGS: CT CHEST FINDINGS

Cardiovascular: Coronary artery calcification and aortic
atherosclerotic calcification.

Mediastinum/Nodes: Large hiatal hernia. No mediastinal
lymphadenopathy. No axillary supraclavicular adenopathy. Retrocrural
node in the hernia sac RIGHT of the hiatal hernia measures 6 mm
(46/)2 compared to 9 mm.

Lungs/Pleura: No suspicious pulmonary nodules. Subpleural cystic
change is chronic. Airways normal. Post partial RIGHT lobe resection

Musculoskeletal: No aggressive osseous lesion.

CT ABDOMEN AND PELVIS FINDINGS

Hepatobiliary: Postcholecystectomy. Prominence of the extrahepatic
bile ducts and common bile ducts not changed from prior. Benign cyst
in the LEFT hepatic lobe

Pancreas: Pancreas is normal. No ductal dilatation. No pancreatic
inflammation.

Spleen: Spleen is normal volume.

Adrenals/urinary tract: Adrenal glands and kidneys are normal. The
ureters and bladder normal.

Stomach/Bowel: Hiatal hernia again noted. Diverticula of sigmoid are
moderate to severe. No acute inflammation.

Vascular/Lymphatic: Clustered periaortic lymph nodes again
demonstrated. Example lymph node LEFT aorta just above the
bifurcation measures 9 mm (image 87/2 compared to 10 mm.

Node at the level of the left renal vein measures 6 mm (image 76)
compared to 9 mm.

Lymph node ventral to the aorta and IVC measuring 9 mm (image 80/2)
compares to 10 mm.

LEFT external iliac lymph node measuring 10 mm (image 103/2)
compares to 13 mm.

No new adenopathy.

Reproductive: Unremarkable

Other: No peritoneal metastasis

Musculoskeletal: No aggressive osseous lesion.
IMPRESSION: Chest Impression:

1. No mediastinal lymphadenopathy.
2. Large hiatal hernia.
3. Retrocrural node slightly decreased in size.

Abdomen / Pelvis Impression:

1. Cluster of numerous small retroperitoneal periaortic lymph nodes.
Lymph nodes are decreased slightly in size compared to recent CT
scan.
2. Likewise reduction in LEFT external iliac small lymph node.
3. No evidence of progression of lymphoma.
4. Normal spleen.
5. No bone involvement.

## 2021-04-28 MED ORDER — IOHEXOL 300 MG/ML  SOLN
100.0000 mL | Freq: Once | INTRAMUSCULAR | Status: AC | PRN
  Administered 2021-04-28: 100 mL via INTRAVENOUS

## 2021-04-28 MED ORDER — SODIUM CHLORIDE (PF) 0.9 % IJ SOLN
INTRAMUSCULAR | Status: AC
  Filled 2021-04-28: qty 50

## 2021-04-30 ENCOUNTER — Encounter: Payer: Self-pay | Admitting: Family Medicine

## 2021-04-30 ENCOUNTER — Inpatient Hospital Stay (HOSPITAL_BASED_OUTPATIENT_CLINIC_OR_DEPARTMENT_OTHER): Payer: Medicare Other | Admitting: Hematology and Oncology

## 2021-04-30 ENCOUNTER — Encounter: Payer: Self-pay | Admitting: Hematology and Oncology

## 2021-04-30 ENCOUNTER — Other Ambulatory Visit: Payer: Self-pay

## 2021-04-30 DIAGNOSIS — C8280 Other types of follicular lymphoma, unspecified site: Secondary | ICD-10-CM | POA: Diagnosis not present

## 2021-04-30 DIAGNOSIS — Z79899 Other long term (current) drug therapy: Secondary | ICD-10-CM | POA: Diagnosis not present

## 2021-04-30 DIAGNOSIS — Z7901 Long term (current) use of anticoagulants: Secondary | ICD-10-CM | POA: Diagnosis not present

## 2021-04-30 DIAGNOSIS — C8298 Follicular lymphoma, unspecified, lymph nodes of multiple sites: Secondary | ICD-10-CM | POA: Diagnosis not present

## 2021-04-30 NOTE — Assessment & Plan Note (Signed)
I have reviewed multiple imaging studies with the patient and his wife ?His labs are normal ?His chronic back pain is not related to his disease ?He had recent visit at [facility] ?I explained to the patient and his wife that he is currently not a candidate for CAR-T cell because he does not need it ?I recommend repeat blood work and imaging study in 6 months ?I will send an update to his oncologist at [facility] with recent test results ?

## 2021-04-30 NOTE — Progress Notes (Signed)
Anthony Kelly ?OFFICE PROGRESS NOTE ? ?Patient Care Team: ?Dettinger, [name], MD as PCP - General (Family Medicine) ?Arnoldo Lenis, MD as PCP - Cardiology (Cardiology) ?Thompson Grayer, MD as PCP - Electrophysiology (Cardiology) ?[name], MD as Consulting Physician (Gastroenterology) ?[name], MD as Consulting Physician (Hematology and Oncology) ?Druscilla Brownie, MD as Consulting Physician (Dermatology) ? ?ASSESSMENT & PLAN:  ?Follicular low grade B-cell lymphoma (HCC) ?I have reviewed multiple imaging studies with the patient and his wife ?His labs are normal ?His chronic back pain is not related to his disease ?He had recent visit at [facility] ?I explained to the patient and his wife that he is currently not a candidate for CAR-T cell because he does not need it ?I recommend repeat blood work and imaging study in 6 months ?I will send an update to his oncologist at [facility] with recent test results ? ?Orders Placed This Encounter  ?Procedures  ? CT CHEST ABDOMEN PELVIS W CONTRAST  ?  Standing Status:   Future  ?  Standing Expiration Date:   05/01/2022  ?  Order Specific Question:   Preferred imaging location?  ?  Answer:   [facility]  ?  Order Specific Question:   Radiology Contrast Protocol - do NOT remove file path  ?  Answer:   [email or web address]  ? ? ?All questions were answered. The patient knows to call the clinic with any problems, questions or concerns. ?The total time spent in the appointment was 30 minutes encounter with patients including review of chart and various tests results, discussions about plan of care and coordination of care plan ?  ?[name], MD ?04/30/2021 9:07 AM ? ?INTERVAL HISTORY: ?Please see below for problem oriented charting. ?he returns for surveillance follow-up for recurrent follicular lymphoma ?He has chronic back pain intermittently ?Denies recent chest pain or shortness of breath ?No new palpable  lymphadenopathy ?Denies recent infection ? ?REVIEW OF SYSTEMS:   ?Constitutional: Denies fevers, chills or abnormal weight loss ?Eyes: Denies blurriness of vision ?Ears, nose, mouth, throat, and face: Denies mucositis or sore throat ?Respiratory: Denies cough, dyspnea or wheezes ?Cardiovascular: Denies palpitation, chest discomfort or lower extremity swelling ?Gastrointestinal:  Denies nausea, heartburn or change in bowel habits ?Skin: Denies abnormal skin rashes ?Lymphatics: Denies new lymphadenopathy or easy bruising ?Neurological:Denies numbness, tingling or new weaknesses ?Behavioral/Psych: Mood is stable, no new changes  ?All other systems were reviewed with the patient and are negative. ? ?I have reviewed the past medical history, past surgical history, social history and family history with the patient and they are unchanged from previous note. ? ?ALLERGIES:  has No Known Allergies. ? ?MEDICATIONS:  ?Current Outpatient Medications  ?Medication Sig Dispense Refill  ? apixaban (ELIQUIS) 5 MG TABS tablet TAKE 1 TABLET TWICE A DAY 180 tablet 1  ? Ascorbic Acid (VITAMIN C) 1000 MG tablet     ? brimonidine (ALPHAGAN) 0.2 % ophthalmic solution Place 1 drop into both eyes 2 (two) times daily.     ? cholecalciferol (VITAMIN D3) 25 MCG (1000 UNIT) tablet Take 1,000 Units by mouth daily.    ? flecainide (TAMBOCOR) 50 MG tablet Take 1 tablet (50 mg total) by mouth 2 (two) times daily. 180 tablet 3  ? Lifitegrast (XIIDRA) 5 % SOLN Place 1 drop into both eyes daily.    ? LUMIGAN 0.01 % SOLN Place 1 drop into both eyes daily.    ? meclizine (ANTIVERT) 25 MG tablet Take 1 tablet (25  mg total) by mouth 3 (three) times daily as needed for dizziness. 30 tablet 1  ? metoprolol succinate (TOPROL-XL) 25 MG 24 hr tablet TAKE ONE-HALF (1/2) TABLET DAILY 45 tablet 3  ? Multiple Vitamin (MULTIVITAMIN WITH MINERALS) TABS tablet Take 1 tablet by mouth daily.     ? omeprazole (PRILOSEC) 20 MG capsule Take 1 capsule (20 mg total) by mouth  daily. 90 capsule 3  ? Probiotic Product (SUPERIOR PROBIOTIC) CAPS     ? rosuvastatin (CRESTOR) 20 MG tablet TAKE 1 TABLET DAILY 90 tablet 3  ? silodosin (RAPAFLO) 8 MG CAPS capsule Take 1 capsule (8 mg total) by mouth daily with breakfast. 90 capsule 3  ? ?No current facility-administered medications for this visit.  ? ? ?SUMMARY OF ONCOLOGIC HISTORY: ?Oncology History Overview Note  ?Low grade B-cell lymphoma ?  Primary site: Lymphoid Neoplasms ?  Staging method: AJCC 6th Edition ?  Clinical: Stage IV signed by [name], MD on 09/26/2013  9:15 AM ?  Summary: Stage IV ? ? ? ? ?  ?Follicular low grade B-cell lymphoma (HCC)  ?12/10/2003 Surgery  ? 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 ? ?  ?12/12/2003 - 06/01/2004 Chemotherapy  ? He was treated with R. CHOP chemotherapy which show complete remission. The number of cycles of R. CHOP chemotherapy was unknown. ? ?  ?12/19/2006 Surgery  ? Lung resection show follicular lymphoma. ? ?  ?01/02/2007 - 09/01/2008 Chemotherapy  ? The patient was treated with single agent rituximab alone. ? ?  ?01/19/2007 Bone Marrow Biopsy  ? Bone marrow biopsy was negative. ? ?  ?04/30/2011 Surgery  ? Submandibular lymph node biopsy showed follicular lymphoma. ? ?  ?05/08/2013 - 05/11/2013 Hospital Admission  ? The patient was admitted to the hospital for management of pericarditis. CT scan showed extensive lymphadenopathy. ? ?  ?06/07/2013 Imaging  ? PET/CT scan showed extensive lymphadenopathy ? ?  ?06/25/2013 Procedure  ? He has placement of Infuse-a-Port. ? ?  ?06/28/2013 Bone Marrow Biopsy  ? Bone marrow biopsy is positive for lymphoma involvement. ? ?  ?07/04/2013 - 11/22/2013 Chemotherapy  ? He is treated with 6 cycles of bendamustine with rituximab. ? ?  ?09/24/2013 Imaging  ? Repeat PET scan show complete remission. ? ?  ?12/26/2013 Imaging  ? PEt scan showed complete remission ? ?  ?09/26/2014 Imaging  ? CT scan of the chest abdomen and pelvis show no evidence of disease ? ?   ?03/26/2015 Imaging  ? CT scan showed no evidence of lymphoma ? ?  ?04/14/2016 Imaging  ? CT: Borderline prominent right hilar and subcarinal lymph nodes, but not appreciably changed. ?2. Low-grade but increased central mesenteric stranding with some small mesenteric lymph nodes. This could certainly be inflammatory, and there is no bulky adenopathy to suggest a malignant etiology.  ?3. Coronary and aortoiliac atherosclerotic calcification. ?4. Centrilobular and paraseptal emphysema. Postoperative findings in the right lung. ?5. 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 ?6. Stable mild biliary dilatation, much of which is likely a physiologic response to cholecystectomy. ?7. Sigmoid colon diverticulosis. ?8.  Prominent stool throughout the colon favors constipation. ?9. Enlarged prostate gland, volume estimated at 75 cubic cm. ? ?  ?02/15/2017 Imaging  ? No evidence of recurrent lymphoma or other acute findings. ? ?Stable moderate hiatal hernia. ? ?Colonic diverticulosis, without radiographic evidence of diverticulitis. ? ?Stable mildly enlarged prostate. ? ?Mild emphysema. ? ?Aortic and coronary artery atherosclerosis. ? ?  ?  01/31/2019 Imaging  ? 1. Interval development of abdominal and pelvic adenopathy compatible with recurrent lymphoma. ?2. Emphysema and aortic atherosclerosis. ?3. Multi vessel coronary artery calcifications. ?4. Moderate to large hiatal hernia ?  ?02/09/2019 Procedure  ? Successful CT-guided core biopsy of the left retroperitoneal periaortic adenopathy ?  ?02/09/2019 Pathology Results  ? SURGICAL PATHOLOGY  ?CASE: [id number]  ?PATIENT: Anthony Kelly  ?Surgical Pathology Report  ? ?Clinical History: Lymphoma; Left para aortic adenopathy (jmc)  ? ?FINAL MICROSCOPIC DIAGNOSIS:  ? ?A. LYMPH NODE, LEFT PARA AORTIC, NEEDLE CORE BIOPSY:  ?-  Follicular lymphoma  ?-  See comment  ? ?COMMENT:  ? ?The biopsy consists of four fragmented lymph node  cores with a vaguely nodular proliferation pattern.  The lymphoid population is composed of small to medium lymphocytes with irregular, cleaved nuclei and scant cytoplasm.  By immunohistochemistry, the lymph

## 2021-05-05 ENCOUNTER — Encounter: Payer: Self-pay | Admitting: Hematology and Oncology

## 2021-05-05 DIAGNOSIS — H5051 Esophoria: Secondary | ICD-10-CM | POA: Diagnosis not present

## 2021-05-05 DIAGNOSIS — H532 Diplopia: Secondary | ICD-10-CM | POA: Diagnosis not present

## 2021-05-18 ENCOUNTER — Ambulatory Visit: Payer: Medicare Other | Admitting: Hematology and Oncology

## 2021-05-18 ENCOUNTER — Other Ambulatory Visit: Payer: Medicare Other

## 2021-05-26 ENCOUNTER — Encounter: Payer: Self-pay | Admitting: Family Medicine

## 2021-05-28 ENCOUNTER — Ambulatory Visit (INDEPENDENT_AMBULATORY_CARE_PROVIDER_SITE_OTHER): Payer: Medicare Other | Admitting: Family Medicine

## 2021-05-28 VITALS — BP 129/76 | HR 60 | Temp 97.9°F | Ht 72.0 in | Wt 191.0 lb

## 2021-05-28 DIAGNOSIS — M546 Pain in thoracic spine: Secondary | ICD-10-CM

## 2021-05-28 DIAGNOSIS — I2510 Atherosclerotic heart disease of native coronary artery without angina pectoris: Secondary | ICD-10-CM

## 2021-05-28 MED ORDER — METHYLPREDNISOLONE ACETATE 80 MG/ML IJ SUSP
80.0000 mg | Freq: Once | INTRAMUSCULAR | Status: AC
  Administered 2021-05-28: 80 mg via INTRAMUSCULAR

## 2021-05-28 MED ORDER — PREDNISONE 20 MG PO TABS: ORAL_TABLET | ORAL | 0 refills | Status: DC

## 2021-05-28 NOTE — Progress Notes (Signed)
BP 129/76   Pulse 60   Temp 97.9 F (36.6 C)   Ht 6' (1.829 m)   Wt 191 lb (86.6 kg)   SpO2 98%   BMI 25.90 kg/m    Subjective:   Patient ID: Anthony Kelly, male    DOB: [birth year], 76 y.o.   MRN: [id number]  HPI: Anthony Kelly is a 76 y.o. male presenting on 05/28/2021 for Back Pain (Right mid to lower. Started 2+ months ago. Pain does not radiate down leg. Pt denies injury, pain/bleeding with urination. )   HPI Mid back pain on the right side Patient is coming in with complaint of mid back pain on the right side.  He says its been going on for 2 months and he has been trying to use heat and ice and liniment and cyclobenzaprine and they help a little bit but are not getting rid of it.  He says that he has trouble getting comfortable sleeping at night and especially bending forward will cause a flare back up and he rates the pain as severe.  He says it does not radiate anywhere down his leg or anywhere else on his body but mainly is in that right mid back region on the right side.  Looking at his x-ray he does have a history of a T7 bulging disc a few years ago on MRI.  He says the pain feels very sharp in nature and he has trouble getting comfortable with that.  Relevant past medical, surgical, family and social history reviewed and updated as indicated. Interim medical history since our last visit reviewed. Allergies and medications reviewed and updated.  Review of Systems  Constitutional:  Negative for chills and fever.  Eyes:  Negative for visual disturbance.  Respiratory:  Negative for shortness of breath and wheezing.   Cardiovascular:  Negative for chest pain and leg swelling.  Gastrointestinal:  Negative for abdominal pain.  Genitourinary:  Negative for dysuria, flank pain, frequency and urgency.  Musculoskeletal:  Positive for arthralgias, back pain and myalgias. Negative for gait problem.  Skin:  Negative for rash.  All other systems reviewed and are negative.  Per HPI  unless specifically indicated above   Allergies as of 05/28/2021   No Known Allergies      Medication List        Accurate as of May 28, 2021  8:58 AM. If you have any questions, ask your nurse or doctor.          brimonidine 0.2 % ophthalmic solution Commonly known as: ALPHAGAN Place 1 drop into both eyes 2 (two) times daily.   cholecalciferol 25 MCG (1000 UNIT) tablet Commonly known as: VITAMIN D3 Take 1,000 Units by mouth daily.   Eliquis 5 MG Tabs tablet Generic drug: apixaban TAKE 1 TABLET TWICE A DAY   flecainide 50 MG tablet Commonly known as: TAMBOCOR Take 1 tablet (50 mg total) by mouth 2 (two) times daily.   Lumigan 0.01 % Soln Generic drug: bimatoprost Place 1 drop into both eyes daily.   meclizine 25 MG tablet Commonly known as: ANTIVERT Take 1 tablet (25 mg total) by mouth 3 (three) times daily as needed for dizziness.   metoprolol succinate 25 MG 24 hr tablet Commonly known as: TOPROL-XL TAKE ONE-HALF (1/2) TABLET DAILY   multivitamin with minerals Tabs tablet Take 1 tablet by mouth daily.   omeprazole 20 MG capsule Commonly known as: PRILOSEC Take 1 capsule (20 mg total) by mouth daily.   predniSONE 20  MG tablet Commonly known as: DELTASONE 2 po at same time daily for 5 days Started by: [name] Darien Kading, MD   rosuvastatin 20 MG tablet Commonly known as: CRESTOR TAKE 1 TABLET DAILY   silodosin 8 MG Caps capsule Commonly known as: RAPAFLO Take 1 capsule (8 mg total) by mouth daily with breakfast.   Superior Probiotic Caps   vitamin C 1000 MG tablet   Xiidra 5 % Soln Generic drug: Lifitegrast Place 1 drop into both eyes daily.         Objective:   BP 129/76   Pulse 60   Temp 97.9 F (36.6 C)   Ht 6' (1.829 m)   Wt 191 lb (86.6 kg)   SpO2 98%   BMI 25.90 kg/m   Wt Readings from Last 3 Encounters:  05/28/21 191 lb (86.6 kg)  04/30/21 195 lb 3.2 oz (88.5 kg)  04/01/21 192 lb (87.1 kg)    Physical Exam Vitals and  nursing note reviewed.  Constitutional:      General: He is not in acute distress.    Appearance: He is well-developed. He is not diaphoretic.  Eyes:     General: No scleral icterus.    Conjunctiva/sclera: Conjunctivae normal.  Musculoskeletal:        General: Normal range of motion.     Thoracic back: Tenderness present. No deformity, spasms or bony tenderness.       Back:  Skin:    General: Skin is warm and dry.     Findings: No rash.  Neurological:     Mental Status: He is alert and oriented to person, place, and time.     Coordination: Coordination normal.  Psychiatric:        Behavior: Behavior normal.      Assessment & Plan:   Problem List Items Addressed This Visit   None Visit Diagnoses     Acute right-sided thoracic back pain    -  Primary   Relevant Medications   methylPREDNISolone acetate (DEPO-MEDROL) injection 80 mg (Start on 05/28/2021  9:00 AM)   predniSONE (DELTASONE) 20 MG tablet       With the pain being nonreproducible, I am concerned that it is more of a nerve impingement especially with a history of MRI of a bulging disc in that region. We will do short course of steroids plus an injection today to see if we can improve it but if not may consider going for nerve injections in the future. Follow up plan: Return if symptoms worsen or fail to improve.  Counseling provided for all of the vaccine components No orders of the defined types were placed in this encounter.   Caryl Pina, MD [facility] 05/28/2021, 8:58 AM

## 2021-06-23 DIAGNOSIS — L249 Irritant contact dermatitis, unspecified cause: Secondary | ICD-10-CM | POA: Diagnosis not present

## 2021-06-23 DIAGNOSIS — L821 Other seborrheic keratosis: Secondary | ICD-10-CM | POA: Diagnosis not present

## 2021-06-23 DIAGNOSIS — Z85828 Personal history of other malignant neoplasm of skin: Secondary | ICD-10-CM | POA: Diagnosis not present

## 2021-06-23 DIAGNOSIS — L570 Actinic keratosis: Secondary | ICD-10-CM | POA: Diagnosis not present

## 2021-06-23 DIAGNOSIS — Z86008 Personal history of in-situ neoplasm of other site: Secondary | ICD-10-CM | POA: Diagnosis not present

## 2021-06-23 DIAGNOSIS — L300 Nummular dermatitis: Secondary | ICD-10-CM | POA: Diagnosis not present

## 2021-06-23 DIAGNOSIS — D485 Neoplasm of uncertain behavior of skin: Secondary | ICD-10-CM | POA: Diagnosis not present

## 2021-07-06 ENCOUNTER — Other Ambulatory Visit: Payer: Self-pay | Admitting: Cardiology

## 2021-07-06 ENCOUNTER — Encounter: Payer: Self-pay | Admitting: Urology

## 2021-07-06 ENCOUNTER — Ambulatory Visit (INDEPENDENT_AMBULATORY_CARE_PROVIDER_SITE_OTHER): Payer: Medicare Other | Admitting: Urology

## 2021-07-06 VITALS — Ht 72.0 in | Wt 184.0 lb

## 2021-07-06 DIAGNOSIS — N401 Enlarged prostate with lower urinary tract symptoms: Secondary | ICD-10-CM

## 2021-07-06 DIAGNOSIS — R339 Retention of urine, unspecified: Secondary | ICD-10-CM

## 2021-07-06 DIAGNOSIS — N138 Other obstructive and reflux uropathy: Secondary | ICD-10-CM | POA: Diagnosis not present

## 2021-07-06 LAB — URINALYSIS, ROUTINE W REFLEX MICROSCOPIC
Bilirubin, UA: NEGATIVE
Glucose, UA: NEGATIVE
Ketones, UA: NEGATIVE
Leukocytes,UA: NEGATIVE
Nitrite, UA: NEGATIVE
Protein,UA: NEGATIVE
RBC, UA: NEGATIVE
Specific Gravity, UA: 1.01 (ref 1.005–1.030)
Urobilinogen, Ur: 0.2 mg/dL (ref 0.2–1.0)
pH, UA: 5.5 (ref 5.0–7.5)

## 2021-07-06 LAB — BLADDER SCAN AMB NON-IMAGING: Scan Result: 89

## 2021-07-06 NOTE — Progress Notes (Signed)
post void residual =61mL

## 2021-07-07 DIAGNOSIS — L308 Other specified dermatitis: Secondary | ICD-10-CM | POA: Diagnosis not present

## 2021-07-16 ENCOUNTER — Encounter: Payer: Self-pay | Admitting: Hematology and Oncology

## 2021-07-20 ENCOUNTER — Encounter: Payer: Self-pay | Admitting: Cardiology

## 2021-07-20 ENCOUNTER — Other Ambulatory Visit: Payer: Self-pay | Admitting: Cardiology

## 2021-07-20 NOTE — Telephone Encounter (Signed)
Prescription refill request for Eliquis received. Indication: PAF Last office visit: 02/16/21  Zandra Abts MD Scr: 1.07 on 04/28/21 Age: 76 Weight: 87.2kg  Based on above findings Eliquis 5mg  twice daily is the appropriate dose.   Refill approved.

## 2021-07-23 NOTE — Telephone Encounter (Signed)
Would continue meds as previously prescribed, if recurrent episodes of frequent afib may have to change his long term regimen  Zandra Abts MD

## 2021-08-24 DIAGNOSIS — H401131 Primary open-angle glaucoma, bilateral, mild stage: Secondary | ICD-10-CM | POA: Diagnosis not present

## 2021-08-24 DIAGNOSIS — Z9889 Other specified postprocedural states: Secondary | ICD-10-CM | POA: Diagnosis not present

## 2021-08-24 DIAGNOSIS — H353131 Nonexudative age-related macular degeneration, bilateral, early dry stage: Secondary | ICD-10-CM | POA: Diagnosis not present

## 2021-08-24 DIAGNOSIS — H2512 Age-related nuclear cataract, left eye: Secondary | ICD-10-CM | POA: Diagnosis not present

## 2021-08-24 DIAGNOSIS — H524 Presbyopia: Secondary | ICD-10-CM | POA: Diagnosis not present

## 2021-08-24 DIAGNOSIS — H04123 Dry eye syndrome of bilateral lacrimal glands: Secondary | ICD-10-CM | POA: Diagnosis not present

## 2021-08-25 ENCOUNTER — Ambulatory Visit (INDEPENDENT_AMBULATORY_CARE_PROVIDER_SITE_OTHER): Payer: Medicare Other | Admitting: Cardiology

## 2021-08-25 ENCOUNTER — Encounter: Payer: Self-pay | Admitting: Cardiology

## 2021-08-25 VITALS — BP 122/78 | HR 72 | Ht 72.0 in | Wt 194.2 lb

## 2021-08-25 DIAGNOSIS — D6869 Other thrombophilia: Secondary | ICD-10-CM | POA: Diagnosis not present

## 2021-08-25 DIAGNOSIS — I10 Essential (primary) hypertension: Secondary | ICD-10-CM

## 2021-08-25 DIAGNOSIS — I480 Paroxysmal atrial fibrillation: Secondary | ICD-10-CM

## 2021-08-25 DIAGNOSIS — I2510 Atherosclerotic heart disease of native coronary artery without angina pectoris: Secondary | ICD-10-CM | POA: Diagnosis not present

## 2021-08-25 MED ORDER — METOPROLOL SUCCINATE ER 25 MG PO TB24: 25.0000 mg | ORAL_TABLET | Freq: Every day | ORAL | Status: DC

## 2021-08-25 NOTE — Patient Instructions (Addendum)
Medication Instructions:  Increase Toprol to 25mg  daily.  Continue all current medications.  Labwork: none  Testing/Procedures: none  Follow-Up: 6 months   Any Other Special Instructions Will Be Listed Below (If Applicable).  If you need a refill on your cardiac medications before your next appointment, please call your pharmacy.

## 2021-08-25 NOTE — Progress Notes (Signed)
Clinical Summary Anthony Kelly is a 76 y.o.male seen today for follow up of the following medical problems.    1. PAF - followed by EP, started on flecanide    - no recent palpitations - compliant with meds - no bleeding on eliquis.    -episode of afib in July, lasted about 1.5 hours - 1-2 times per month, lasts just a few minutes - compliant with meds - compliant with eliquis    2. CAD - Coronary CT angiography demonstrated nonobstructive coronary artery disease in the LAD, left circumflex, and RCA. - 05/2019 cath: prox LAD 25%, ramus 50%, RCA 25% - 03/2019 TC 137 TG 75 HDL 61 LDL 61 - 09/2020 TC 109 TG 128 HDL 49 LDL 38   08/2020 echo LVEF 60-65%, no WMAs   - no chest pains.    3. NSVT -denies any symptoms     4. Lymphoma - followed by oncology   5. Double vision - transient episode, seen in ER 07/24/20 - MRI/MRA no acute process, no significant cerebrovascular disease - followed by optho, pcp  6. HTN - home bp's 136/70s  7. 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 04/2013 -adm with CP, elevated CRP. H/o coronary artery calcification on prior CT but nuc was negative, EF 71%.   Arthritis    Atrial fibrillation ([place])    a. Isolated episode in the setting of acute pericarditis 04/2013. Was not placed on anticoag.   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      cholecalciferol (VITAMIN D3) 25 MCG (1000 UNIT) tablet Take 1,000 Units by mouth daily.     flecainide (TAMBOCOR) 50 MG tablet TAKE 1 TABLET TWICE A DAY 180 tablet 1   Lifitegrast (XIIDRA) 5 % SOLN Place 1 drop into both eyes daily.     LUMIGAN 0.01 % SOLN Place 1 drop into both eyes daily.     meclizine (ANTIVERT) 25 MG tablet Take 1 tablet (25 mg total) by mouth 3 (three) times daily as needed for dizziness. 30 tablet 1   metoprolol succinate (TOPROL-XL) 25 MG 24 hr tablet TAKE ONE-HALF (1/2) TABLET DAILY  45 tablet 3   Multiple Vitamin (MULTIVITAMIN WITH MINERALS) TABS tablet Take 1 tablet by mouth daily.      omeprazole (PRILOSEC) 20 MG capsule Take 1 capsule (20 mg total) by mouth daily. 90 capsule 3   Probiotic Product (SUPERIOR PROBIOTIC) CAPS      rosuvastatin (CRESTOR) 20 MG tablet TAKE 1 TABLET DAILY 90 tablet 3   silodosin (RAPAFLO) 8 MG CAPS capsule Take 1 capsule (8 mg total) by mouth daily with breakfast. 90 capsule 3   No current facility-administered medications for this visit.     Past Surgical History:  Procedure Laterality Date   CHOLECYSTECTOMY     EXPLORATORY LAPAROTOMY     EYE SURGERY Right 2016   cataract   LEFT HEART CATH AND CORONARY ANGIOGRAPHY N/A 06/05/2019   Procedure: LEFT HEART CATH AND CORONARY ANGIOGRAPHY;  Surgeon: Jettie Booze, MD;  Location: [facility];  Service: Cardiovascular;  Laterality: N/A;   LUNG LOBECTOMY     right side   LYMPH NODE BIOPSY     in groin with removal   MENISCUS REPAIR     right knee   MOHS SURGERY Left 09/2020   cheek   PROSTATE SURGERY     REFRACTIVE SURGERY Right    piece of metal removed   SUBMANDIBULAR GLAND EXCISION  04/2011   SUBMANDIBULAR GLAND EXCISION  04/30/2011   Procedure: EXCISION SUBMANDIBULAR GLAND;  Surgeon: Jerrell Belfast, MD;  Location: [facility] OR;  Service: ENT;  Laterality: Left;  WITH DIAGNOSTIC BIOPSY   TONSILLECTOMY     as a child   VEIN LIGATION AND STRIPPING     right leg     No Known Allergies    Family History  Problem Relation Age of Onset   Heart disease Father    Heart attack Father        x 3   Cancer Father    Cancer Sister        breast ca   Cancer Brother        prostate ca   Heart attack Sister    Cancer Sister        breast   Heart disease Brother    Alzheimer's disease Sister    Diabetes Sister    Heart disease Brother    Diabetes Brother    Heart disease Brother    Heart disease Brother    Heart disease Brother    Heart disease Brother    Heart disease Brother    Alzheimer's disease Brother    Diabetes Son    Cancer Brother    Anesthesia problems Neg Hx      Social History Anthony Kelly reports that he quit smoking about 37 years ago. His smoking use included cigarettes. He started smoking about 58 years ago. He has a 15.00 pack-year smoking history. He has never used smokeless tobacco. Anthony Kelly reports that he does not currently use alcohol.   Review of Systems CONSTITUTIONAL: No weight loss, fever, chills, weakness or fatigue.  HEENT: Eyes: No visual loss, blurred vision, double vision or yellow sclerae.No hearing loss, sneezing, congestion, runny nose or sore throat.  SKIN: No rash or itching.  CARDIOVASCULAR: per hpi RESPIRATORY: No shortness of breath, cough or sputum.  GASTROINTESTINAL: No anorexia, nausea, vomiting or diarrhea. No  abdominal pain or blood.  GENITOURINARY: No burning on urination, no polyuria NEUROLOGICAL: No headache, dizziness, syncope, paralysis, ataxia, numbness or tingling in the extremities. No change in bowel or bladder  control.  MUSCULOSKELETAL: No muscle, back pain, joint pain or stiffness.  LYMPHATICS: No enlarged nodes. No history of splenectomy.  PSYCHIATRIC: No history of depression or anxiety.  ENDOCRINOLOGIC: No reports of sweating, cold or heat intolerance. No polyuria or polydipsia.  Marland Kitchen   Physical Examination Today's Vitals   08/25/21 0914  BP: 122/78  Pulse: 72  SpO2: 97%  Weight: 194 lb 3.2 oz (88.1 kg)  Height: 6' (1.829 m)   Body mass index is 26.34 kg/m.  Gen: resting comfortably, no acute distress HEENT: no scleral icterus, pupils equal round and reactive, no palptable cervical adenopathy,  CV: RRR, no m/r/g no jvd Resp: Clear to auscultation bilaterally GI: abdomen is soft, non-tender, non-distended, normal bowel sounds, no hepatosplenomegaly MSK: extremities are warm, no edema.  Skin: warm, no rash Neuro:  no focal deficits Psych: appropriate affect   Diagnostic Studies   Coronary CT angiography 04/27/2019:   1. Left Main:  No significant stenosis.   2. LAD: No significant stenosis.  FFR 0.9 proximal, 0.84 distal 3. LCX: No significant stenosis. FFR 0.9 in the mid vessel, 0.84 distally 4. RCA: No significant stenosis. FFR 0.9 proximally, 0.87 mid, 0.80 distally   IMPRESSION: 1.  CT FFR analysis didn't show any significant stenosis.     Echocardiogram 05/22/2019:   1. Left ventricular ejection fraction, by estimation, is 60 to 65%. The  left ventricle has normal function. The left ventricle has no regional  wall motion abnormalities. There is mild left ventricular hypertrophy.  Left ventricular diastolic parameters  are indeterminate.   2. Right ventricular systolic function is normal. The right ventricular  size is mildly enlarged. Mildly increased  right ventricular wall  thickness. Tricuspid regurgitation signal is inadequate for assessing PA  pressure.   3. Left atrial size was mildly dilated.   4. The mitral valve is grossly normal. Trivial mitral valve  regurgitation.   5. The aortic valve is tricuspid. Aortic valve regurgitation is not  visualized.   6. The inferior vena cava is normal in size with greater than 50%  respiratory variability, suggesting right atrial pressure of 3 mmHg.   08/2020 echo IMPRESSIONS     1. Left ventricular ejection fraction, by estimation, is 60 to 65%. The  left ventricle has normal function. The left ventricle has no regional  wall motion abnormalities. There is mild left ventricular hypertrophy.  Left ventricular diastolic parameters  were normal.   2. Right ventricular systolic function is normal. The right ventricular  size is normal. Tricuspid regurgitation signal is inadequate for assessing  PA pressure.   3. The mitral valve is normal in structure. No evidence of mitral valve  regurgitation. No evidence of mitral stenosis.   4. The aortic valve is tricuspid. Aortic valve regurgitation is not  visualized. No aortic stenosis is present.   5. The inferior vena cava is normal in size with greater than 50%  respiratory variability, suggesting right atrial pressure of 3 mmHg.    Assessment and Plan  1. PAF/acquired thrombophilia - in the absence of clinically significant CAD have continued flecanide -some palpitatoins at times, we will increase toprol to 25mg  daily. If significant progression of symptoms would increase flecanide - continue eliquis for stroke prevention   2. HTN - some slightly elevated bp's at home sometimes, today he is at goal - follow with higher dose of toprol, not at a point where additional therapy is needed       Arnoldo Lenis, M.D.

## 2021-09-10 ENCOUNTER — Telehealth: Payer: Self-pay

## 2021-09-10 NOTE — Telephone Encounter (Signed)
-----   Message from [name], MD sent at 09/10/2021  7:16 AM EDT ----- Regarding: non urgent Can you call his wife and tell her to call CT to be done about 1.5 hours after his lab appt on 10/12? Thanks

## 2021-09-10 NOTE — Telephone Encounter (Signed)
Called and spoke with [name] and Gershon Mussel. They will call to schedule CT.

## 2021-09-11 ENCOUNTER — Ambulatory Visit: Payer: Medicare Other | Admitting: Internal Medicine

## 2021-09-11 ENCOUNTER — Encounter: Payer: Self-pay | Admitting: Hematology and Oncology

## 2021-09-15 ENCOUNTER — Encounter: Payer: Self-pay | Admitting: Cardiology

## 2021-09-15 ENCOUNTER — Encounter: Payer: Self-pay | Admitting: Hematology and Oncology

## 2021-09-22 DIAGNOSIS — L2089 Other atopic dermatitis: Secondary | ICD-10-CM | POA: Diagnosis not present

## 2021-09-23 NOTE — Telephone Encounter (Signed)
Would try to avoid iburprofen, is used would need to very very sparingly. Would not take more than 4-5 days  Zandra Abts MD

## 2021-09-24 ENCOUNTER — Encounter: Payer: Self-pay | Admitting: Hematology and Oncology

## 2021-09-24 DIAGNOSIS — Z23 Encounter for immunization: Secondary | ICD-10-CM | POA: Diagnosis not present

## 2021-09-29 ENCOUNTER — Encounter: Payer: Self-pay | Admitting: Cardiology

## 2021-09-30 MED ORDER — METOPROLOL SUCCINATE ER 25 MG PO TB24: 25.0000 mg | ORAL_TABLET | Freq: Every day | ORAL | 1 refills | Status: DC

## 2021-10-05 ENCOUNTER — Encounter: Payer: Self-pay | Admitting: Family Medicine

## 2021-10-05 ENCOUNTER — Ambulatory Visit (INDEPENDENT_AMBULATORY_CARE_PROVIDER_SITE_OTHER): Payer: Medicare Other | Admitting: Family Medicine

## 2021-10-05 VITALS — BP 98/63 | HR 69 | Temp 97.4°F | Ht 72.0 in | Wt 189.0 lb

## 2021-10-05 DIAGNOSIS — E782 Mixed hyperlipidemia: Secondary | ICD-10-CM

## 2021-10-05 DIAGNOSIS — R7303 Prediabetes: Secondary | ICD-10-CM

## 2021-10-05 DIAGNOSIS — K219 Gastro-esophageal reflux disease without esophagitis: Secondary | ICD-10-CM

## 2021-10-05 DIAGNOSIS — I2510 Atherosclerotic heart disease of native coronary artery without angina pectoris: Secondary | ICD-10-CM | POA: Diagnosis not present

## 2021-10-05 DIAGNOSIS — Z23 Encounter for immunization: Secondary | ICD-10-CM

## 2021-10-05 LAB — BAYER DCA HB A1C WAIVED: HB A1C (BAYER DCA - WAIVED): 6.2 % — ABNORMAL HIGH (ref 4.8–5.6)

## 2021-10-05 MED ORDER — OMEPRAZOLE 20 MG PO CPDR: 20.0000 mg | DELAYED_RELEASE_CAPSULE | Freq: Every day | ORAL | 3 refills | Status: DC

## 2021-10-05 NOTE — Progress Notes (Signed)
BP 98/63   Pulse 69   Temp (!) 97.4 F (36.3 C)   Ht 6' (1.829 m)   Wt 189 lb (85.7 kg)   SpO2 96%   BMI 25.63 kg/m    Subjective:   Patient ID: Anthony Kelly, male    DOB: Mar 27, 1945, 76 y.o.   MRN: [id number]  HPI: Anthony Kelly is a 76 y.o. male presenting on 10/05/2021 for Medical Management of Chronic Issues, Hyperlipidemia, Gastroesophageal Reflux, and Prediabetes   HPI GERD Patient is currently on omeprazole.  She denies any major symptoms or abdominal pain or belching or burping. She denies any blood in her stool or lightheadedness or dizziness.   Hyperlipidemia Patient is coming in for recheck of his hyperlipidemia. The patient is currently taking Crestor. They deny any issues with myalgias or history of liver damage from it. They deny any focal numbness or weakness or chest pain.   Prediabetes Patient comes in today for recheck of his diabetes. Patient has been currently taking no medication currently diet control. Patient is not currently on an ACE inhibitor/ARB. Patient has not seen an ophthalmologist this year. Patient denies any issues with their feet. The symptom started onset as an adult hyperlipidemia and CAD A-fib ARE RELATED TO DM   Relevant past medical, surgical, family and social history reviewed and updated as indicated. Interim medical history since our last visit reviewed. Allergies and medications reviewed and updated.  Review of Systems  Constitutional:  Negative for chills and fever.  Eyes:  Negative for visual disturbance.  Respiratory:  Negative for shortness of breath and wheezing.   Cardiovascular:  Negative for chest pain and leg swelling.  Musculoskeletal:  Negative for back pain and gait problem.  Skin:  Negative for rash.  All other systems reviewed and are negative.   Per HPI unless specifically indicated above   Allergies as of 10/05/2021   No Known Allergies      Medication List        Accurate as of October 05, 2021 11:03  AM. If you have any questions, ask your nurse or doctor.          STOP taking these medications    dexamethasone 0.75 MG tablet Commonly known as: DECADRON Stopped by: Anthony Kelly Anthony Strahan, MD       TAKE these medications    brimonidine 0.2 % ophthalmic solution Commonly known as: ALPHAGAN Place 1 drop into both eyes 2 (two) times daily.   cholecalciferol 25 MCG (1000 UNIT) tablet Commonly known as: VITAMIN D3 Take 1,000 Units by mouth daily.   cyclobenzaprine 10 MG tablet Commonly known as: FLEXERIL Take 10 mg by mouth 2 (two) times daily as needed for muscle spasms.   Eliquis 5 MG Tabs tablet Generic drug: apixaban TAKE 1 TABLET TWICE A DAY   flecainide 50 MG tablet Commonly known as: TAMBOCOR TAKE 1 TABLET TWICE A DAY   Lumigan 0.01 % Soln Generic drug: bimatoprost Place 1 drop into both eyes daily.   metoprolol succinate 25 MG 24 hr tablet Commonly known as: TOPROL-XL Take 1 tablet (25 mg total) by mouth daily.   multivitamin with minerals Tabs tablet Take 1 tablet by mouth daily.   omeprazole 20 MG capsule Commonly known as: PRILOSEC Take 1 capsule (20 mg total) by mouth daily.   rosuvastatin 20 MG tablet Commonly known as: CRESTOR TAKE 1 TABLET DAILY   silodosin 8 MG Caps capsule Commonly known as: RAPAFLO Take 1 capsule (8 mg total) by mouth  daily with breakfast.   Superior Probiotic Caps   vitamin C 1000 MG tablet Take 1,000 mg by mouth daily.   Xiidra 5 % Soln Generic drug: Lifitegrast Place 1 drop into both eyes daily.         Objective:   BP 98/63   Pulse 69   Temp (!) 97.4 F (36.3 C)   Ht 6' (1.829 m)   Wt 189 lb (85.7 kg)   SpO2 96%   BMI 25.63 kg/m   Wt Readings from Last 3 Encounters:  10/05/21 189 lb (85.7 kg)  08/25/21 194 lb 3.2 oz (88.1 kg)  07/06/21 184 lb (83.5 kg)    Physical Exam Vitals and nursing note reviewed.  Constitutional:      General: He is not in acute distress.    Appearance: He is  well-developed. He is not diaphoretic.  Eyes:     General: No scleral icterus.    Conjunctiva/sclera: Conjunctivae normal.  Neck:     Thyroid: No thyromegaly.  Cardiovascular:     Rate and Rhythm: Normal rate and regular rhythm.     Heart sounds: Normal heart sounds. No murmur heard. Pulmonary:     Effort: Pulmonary effort is normal. No respiratory distress.     Breath sounds: Normal breath sounds. No wheezing.  Musculoskeletal:        General: No swelling. Normal range of motion.     Cervical back: Neck supple.  Lymphadenopathy:     Cervical: No cervical adenopathy.  Skin:    General: Skin is warm and dry.     Findings: No rash.  Neurological:     Mental Status: He is alert and oriented to person, place, and time.     Coordination: Coordination normal.  Psychiatric:        Behavior: Behavior normal.     Assessment & Plan:   Problem List Items Addressed This Visit       Digestive   GERD (gastroesophageal reflux disease)   Relevant Medications   omeprazole (PRILOSEC) 20 MG capsule     Other   Hyperlipidemia - Primary   Relevant Orders   CBC with Differential/Platelet   CMP14+EGFR   Lipid panel   Bayer DCA Hb A1c Waived   Prediabetes   Relevant Orders   CBC with Differential/Platelet   CMP14+EGFR   Lipid panel   Bayer DCA Hb A1c Waived   Microalbumin / creatinine urine ratio   Other Visit Diagnoses     Need for Tdap vaccination           Continue current medicine, doing well, no changes.  Blood pressure lower today but he is asymptomatic.  He says it runs better at home.  Heart rate sounds good today. A1c looks good at 6.2. Follow up plan: Return in about 6 months (around 04/05/2022), or if symptoms worsen or fail to improve, for Prediabetes hyperlipidemia GERD.  Counseling provided for all of the vaccine components Orders Placed This Encounter  Procedures   CBC with Differential/Platelet   CMP14+EGFR   Lipid panel   Bayer DCA Hb A1c Waived    Microalbumin / creatinine urine ratio    Anthony Pina, MD [facility] 10/05/2021, 11:03 AM

## 2021-10-06 LAB — CBC WITH DIFFERENTIAL/PLATELET
Basophils Absolute: 0.1 10*3/uL (ref 0.0–0.2)
Basos: 1 %
EOS (ABSOLUTE): 0.4 10*3/uL (ref 0.0–0.4)
Eos: 5 %
Hematocrit: 51.1 % — ABNORMAL HIGH (ref 37.5–51.0)
Hemoglobin: 16.5 g/dL (ref 13.0–17.7)
Immature Grans (Abs): 0 10*3/uL (ref 0.0–0.1)
Immature Granulocytes: 0 %
Lymphocytes Absolute: 1.5 10*3/uL (ref 0.7–3.1)
Lymphs: 17 %
MCH: 28.6 pg (ref 26.6–33.0)
MCHC: 32.3 g/dL (ref 31.5–35.7)
MCV: 89 fL (ref 79–97)
Monocytes Absolute: 0.8 10*3/uL (ref 0.1–0.9)
Monocytes: 8 %
Neutrophils Absolute: 6.1 10*3/uL (ref 1.4–7.0)
Neutrophils: 69 %
Platelets: 228 10*3/uL (ref 150–450)
RBC: 5.76 x10E6/uL (ref 4.14–5.80)
RDW: 11.9 % (ref 11.6–15.4)
WBC: 8.9 10*3/uL (ref 3.4–10.8)

## 2021-10-06 LAB — CMP14+EGFR
ALT: 16 IU/L (ref 0–44)
AST: 18 IU/L (ref 0–40)
Albumin/Globulin Ratio: 2.2 (ref 1.2–2.2)
Albumin: 4.4 g/dL (ref 3.8–4.8)
Alkaline Phosphatase: 64 IU/L (ref 44–121)
BUN/Creatinine Ratio: 12 (ref 10–24)
BUN: 15 mg/dL (ref 8–27)
Bilirubin Total: 0.6 mg/dL (ref 0.0–1.2)
CO2: 23 mmol/L (ref 20–29)
Calcium: 9.7 mg/dL (ref 8.6–10.2)
Chloride: 100 mmol/L (ref 96–106)
Creatinine, Ser: 1.23 mg/dL (ref 0.76–1.27)
Globulin, Total: 2 g/dL (ref 1.5–4.5)
Glucose: 131 mg/dL — ABNORMAL HIGH (ref 70–99)
Potassium: 5.2 mmol/L (ref 3.5–5.2)
Sodium: 139 mmol/L (ref 134–144)
Total Protein: 6.4 g/dL (ref 6.0–8.5)
eGFR: 61 mL/min/{1.73_m2} (ref >59)

## 2021-10-06 LAB — LIPID PANEL
Chol/HDL Ratio: 2.4 ratio (ref 0.0–5.0)
Cholesterol, Total: 99 mg/dL — ABNORMAL LOW (ref 100–199)
HDL: 41 mg/dL (ref >39)
LDL Chol Calc (NIH): 34 mg/dL (ref 0–99)
Triglycerides: 135 mg/dL (ref 0–149)
VLDL Cholesterol Cal: 24 mg/dL (ref 5–40)

## 2021-10-12 ENCOUNTER — Telehealth: Payer: Self-pay | Admitting: Cardiology

## 2021-10-12 MED ORDER — METOPROLOL SUCCINATE ER 25 MG PO TB24: 12.5000 mg | ORAL_TABLET | Freq: Two times a day (BID) | ORAL | 1 refills | Status: DC

## 2021-10-12 NOTE — Telephone Encounter (Signed)
Mildly low heart rates and blood pressures, would not expalin the other symptoms of feeling cold, productibe cough. Try taking the toprol instead of 25mg  daily take 12.5mg  bid. Would touch base with pcp about the cough and cold intolernace.  Zandra Abts MD

## 2021-10-12 NOTE — Telephone Encounter (Signed)
[name] informed and verbalized understanding of plan.

## 2021-10-12 NOTE — Telephone Encounter (Signed)
Spoke with wife who reports patient's blood pressure has been dropping. BP 98/63 last week at PCP office-no symptoms at that time Reports last night BP dropped 96/61 & HR 53. Says he felt his heart skipping beats during that time Says these problems started after he started taking the "cancer pill." Medications reviewed. Reports feeling cold and can't get warm. Reports productive cough with clear mucous and is taking plain mucinex every 12 hours. Denies dizziness, chest pain or SOB. Advised that his blood pressure & heart rate is okay as long as he isn't symptomatic. Advised message would be sent to provider and if symptoms get worse between now and time he is contacted back to take patient to the ED for an evaluation. Verbalized understanding.

## 2021-10-12 NOTE — Telephone Encounter (Signed)
STAT if HR is under 50 or over 120 (normal HR is 60-100 beats per minute)  What is your heart rate? 73  Do you have a log of your heart rate readings (document readings)? Was 50 HR   Do you have any other symptoms? Staying cold. Wife also states that his bp is 107/63       116/71 with 57 HR most recently. She also states that heart was skipping beats in bed last night. She would like to discuss if his medication could be causing this reaction. Please advise.

## 2021-10-20 ENCOUNTER — Telehealth: Payer: Self-pay | Admitting: Cardiovascular Disease

## 2021-10-20 ENCOUNTER — Other Ambulatory Visit: Payer: Self-pay | Admitting: Cardiovascular Disease

## 2021-10-20 ENCOUNTER — Encounter: Payer: Self-pay | Admitting: Cardiovascular Disease

## 2021-10-20 ENCOUNTER — Ambulatory Visit: Payer: Medicare Other | Attending: Internal Medicine | Admitting: Cardiovascular Disease

## 2021-10-20 ENCOUNTER — Ambulatory Visit: Payer: Medicare Other | Attending: Cardiovascular Disease

## 2021-10-20 VITALS — BP 126/84 | HR 75 | Ht 72.0 in | Wt 189.2 lb

## 2021-10-20 DIAGNOSIS — I480 Paroxysmal atrial fibrillation: Secondary | ICD-10-CM | POA: Diagnosis not present

## 2021-10-20 DIAGNOSIS — R002 Palpitations: Secondary | ICD-10-CM

## 2021-10-20 DIAGNOSIS — I2510 Atherosclerotic heart disease of native coronary artery without angina pectoris: Secondary | ICD-10-CM

## 2021-10-20 NOTE — Patient Instructions (Addendum)
Medication Instructions:  Stop Flecainide (Tambocor) Continue all other medications.     Labwork: none  Testing/Procedures: Your physician has recommended that you wear a 14 day event monitor. Event monitors are medical devices that record the heart's electrical activity. Doctors most often [place] these monitors to diagnose arrhythmias. Arrhythmias are problems with the speed or rhythm of the heartbeat. The monitor is a small, portable device. You can wear one while you do your normal daily activities. This is usually used to diagnose what is causing palpitations/syncope (passing out). Office will contact with results via phone, letter or mychart.    Follow-Up: 6 weeks   Any Other Special Instructions Will Be Listed Below (If Applicable).   If you need a refill on your cardiac medications before your next appointment, please call your pharmacy.

## 2021-10-20 NOTE — Progress Notes (Signed)
Cardiology Office Note:    Date:  10/20/2021   ID:  Gavon Majano, DOB January 23, 1945, MRN [id number]  PCP:  Dettinger, [name], MD   [facility] Cardiologist:  Carlyle Dolly, MD Electrophysiologist:  Melida Quitter, MD     Referring MD: Dettinger, [name], MD    History of Present Illness:    Anthony Kelly is a 76 y.o. male with a hx of CAD, Lymphoma, NSVT and PAF who follows-up in electrophysiology clinic for arrhythmia management.  He was last seen by Dr. Rayann Heman in September of 2022. At that time, he reported that he was doing well -- not having any palpitations on flecainide.  Today, he reports that he is doing quite well. He has occasional palpitations, but it's nothing compared to when he was initially receiving chemotherapy. He uses his apple watch to capture an ECG when he has palpitations. I reviewed four or five strips. They all showed sinus rhythm with sinus arrhythmia except one, which showed a supraventricular couplet.  Otherwise, he reports that he has no complaints - no chest pain, syncope, presyncope, shortness of breath.  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 04/2013 -adm with CP, elevated CRP. H/o coronary artery calcification on prior CT but nuc was negative, EF 71%.   Arthritis    Atrial fibrillation ([place])    a. Isolated episode in the setting of acute pericarditis 04/2013. Was not placed on anticoag.   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   lt submandibular ln   Pain in joint, pelvic region and thigh 08/01/2013   PSVT (paroxysmal supraventricular tachycardia)    SCC (squamous cell carcinoma)    Left cheek    Past Surgical History:  Procedure Laterality Date   CHOLECYSTECTOMY     EXPLORATORY LAPAROTOMY     EYE SURGERY Right 2016   cataract   LEFT HEART CATH AND CORONARY ANGIOGRAPHY N/A 06/05/2019   Procedure: LEFT HEART CATH AND CORONARY ANGIOGRAPHY;  Surgeon: Jettie Booze, MD;  Location: [facility];  Service: Cardiovascular;  Laterality: N/A;   LUNG LOBECTOMY     right side   LYMPH NODE BIOPSY     in groin with removal   MENISCUS REPAIR     right knee   MOHS SURGERY Left 09/2020   cheek   PROSTATE SURGERY     REFRACTIVE SURGERY Right    piece of metal removed   SUBMANDIBULAR GLAND EXCISION  04/2011   SUBMANDIBULAR GLAND EXCISION  04/30/2011   Procedure: EXCISION SUBMANDIBULAR GLAND;  Surgeon: Jerrell Belfast, MD;  Location: [facility];  Service: ENT;  Laterality: Left;  WITH DIAGNOSTIC BIOPSY   TONSILLECTOMY     as a child   [place]  right leg    Current Medications: Current Meds  Medication Sig   apixaban (ELIQUIS) 5 MG TABS tablet TAKE 1 TABLET TWICE A DAY   Ascorbic Acid (VITAMIN C) 1000 MG tablet Take 1,000 mg by mouth daily.   brimonidine (ALPHAGAN) 0.2 % ophthalmic solution Place 1 drop into both eyes 2 (two) times daily.    cholecalciferol (VITAMIN D3) 25 MCG (1000 UNIT) tablet Take 1,000 Units by mouth daily.   cyclobenzaprine (FLEXERIL) 10 MG tablet Take 10 mg by mouth 2 (two) times daily as needed for muscle spasms.   flecainide (TAMBOCOR) 50 MG tablet TAKE 1 TABLET TWICE A DAY   Lifitegrast (XIIDRA) 5 % SOLN Place 1 drop into both eyes daily.   LUMIGAN 0.01 % SOLN Place  1 drop into both eyes daily.   metoprolol succinate (TOPROL-XL) 25 MG 24 hr tablet Take 0.5 tablets (12.5 mg total) by mouth 2 (two) times daily.   Multiple Vitamin (MULTIVITAMIN WITH MINERALS) TABS tablet Take 1 tablet by mouth daily.    omeprazole (PRILOSEC) 20 MG capsule Take 1 capsule (20 mg total) by mouth daily. (Patient taking differently: Take 20 mg by mouth every other day.)   Probiotic Product (SUPERIOR PROBIOTIC) CAPS    rosuvastatin (CRESTOR) 20 MG tablet TAKE 1 TABLET DAILY   silodosin (RAPAFLO) 8 MG CAPS capsule Take 1 capsule (8 mg total) by mouth daily with breakfast.     Allergies:   Patient has no known allergies.   Social History   Socioeconomic History   Marital status: Married    Spouse name: [name]   Number of children: 2   Years of education: GED   Highest education level: GED or equivalent  Occupational History   Occupation: Retired     Comment: Secretary/administrator   Occupation: Retired     Comment: [employer]  Tobacco Use   Smoking status: Former    Packs/day: 1.00    Years: 15.00    Total pack years: 15.00    Types: Cigarettes    Start date: 12/12/1962    Quit date: 08/25/1984    Years since quitting: 37.1   Smokeless tobacco: Never  Vaping Use   Vaping Use: Never used  Substance and Sexual Activity   Alcohol use: Not Currently   Drug use: Never   Sexual activity: Not Currently  Other Topics Concern   Not on file  Social History Narrative   Right handed   Decaf coffee (2-3 per day)   Lives with wife   Social Determinants of Health   Financial Resource Strain: Low Risk  (02/25/2021)   Overall Financial Resource Strain (CARDIA)    Difficulty of Paying Living Expenses: Not hard at all  Food Insecurity: No Food Insecurity (02/25/2021)   Hunger Vital Sign    Worried About Running Out of Food in the Last Year: Never true    [place] in the Last Year: Never true  Transportation Needs: No Transportation Needs (02/25/2021)   PRAPARE -  Hydrologist (Medical): No    Lack of Transportation (Non-Medical): No  Physical Activity: Inactive (02/25/2021)   Exercise Vital Sign    Days of Exercise per Week: 0 days    Minutes of Exercise per Session: 0 min  Stress: No Stress Concern Present (02/25/2021)   [place]    Feeling of Stress : Not at all  Social Connections: Socially Isolated (02/25/2021)  Social Licensed conveyancer [NHANES]    Frequency of Communication with Friends and Family: Once a week    Frequency of Social Gatherings with Friends and Family: Once a week    Attends Religious Services: Never    Marine scientist or Organizations: No    Attends Music therapist: Never    Marital Status: Married     Family History: The patient's family history includes Alzheimer's disease in his brother and sister; Cancer in his brother, brother, father, sister, and sister; Diabetes in his brother, sister, and son; Heart attack in his father and sister; Heart disease in his brother, brother, brother, brother, brother, brother, brother, and father. There is no history of Anesthesia problems.  ROS:   Please see the history of present illness.    All other systems reviewed and are negative.  EKGs/Labs/Other Studies Reviewed:     TTE 8/32/2022 EF 60-65%. Normal structure and function with mild LVH  Coronary angiogram 06/05/2019: Prox RCA lesion is 25% stenosed. Ramus lesion is 50% stenosed. Prox LAD to Mid LAD lesion is 25% stenosed. Severe right subclavian tortuosity made torquing catheters difficult. If cath was needed in the future, would consider alternate access site. The left ventricular systolic function is normal. LV end diastolic pressure is normal. The left ventricular ejection fraction is 55-65% by visual estimate. There is no aortic valve stenosis.  EKG:  Last EKG results: 08/2021 Sinus  rhythm   Recent Labs: 10/05/2021: ALT 16; BUN 15; Creatinine, Ser 1.23; Hemoglobin 16.5; Platelets 228; Potassium 5.2; Sodium 139    Risk Assessment/Calculations:    CHA2DS2-VASc Score = 3   This indicates a 3.2% annual risk of stroke. The patient's score is based upon: CHF History: 0 HTN History: 0 Diabetes History: 0 Stroke History: 0 Vascular Disease History: 1 Age Score: 2 Gender Score: 0         Physical Exam:    VS:  BP 126/84   Pulse 75   Ht 6' (1.829 m)   Wt 189 lb 3.2 oz (85.8 kg)   SpO2 97%   BMI 25.66 kg/m     Wt Readings from Last 3 Encounters:  10/20/21 189 lb 3.2 oz (85.8 kg)  10/05/21 189 lb (85.7 kg)  08/25/21 194 lb 3.2 oz (88.1 kg)     GEN:  Well nourished, well developed in no acute distress CARDIAC: RRR, no murmurs, rubs, gallops RESPIRATORY:  Normal work of breathing MUSCULOSKELETAL: no edema    ASSESSMENT & PLAN:    Paroxysmal atrial fibrillation: appears to be doing well on flecainide. It's possible that he has improved now that he is not as sick from [place] and therapy for it. I think it reasonable to reassess -- will place monitor and DC flecainide. Continue eliquis. Palpitations -- PACs, PVCs CAD: mild, non-hemodynamically significant CAD on cath from 2021. Increases risk of malignant, life threatening arrhythmia from flecainide - I did inform the patient of this. Lymphoma      Follow-up 6 weeks  Medication Adjustments/Labs and Tests Ordered: Current medicines are reviewed at length with the patient today.  Concerns regarding medicines are outlined above.  No orders of the defined types were placed in this encounter.  No orders of the defined types were placed in this encounter.    Signed, Melida Quitter, MD  10/20/2021 9:27 AM    [place]

## 2021-10-20 NOTE — Telephone Encounter (Signed)
PERCERT:  14 day zio monitor - afib, palps

## 2021-10-22 ENCOUNTER — Encounter: Payer: Self-pay | Admitting: Cardiovascular Disease

## 2021-10-22 NOTE — Telephone Encounter (Signed)
Spoke verbally with pt about this. He is currently wearing a monitor. Reports HRs going fast and slow, but avg 50-60s mostly. Aware this is not abnormal after stopping antiarrythmic, mainly b/c the Flecainide was controlling the abnormal beats. Aware to continue the monitor and once we get the result and review, will then determine if need to restart flecainide vs another rhythm medication, depending on the result of the monitor. Patient verbalized understanding and agreeable to plan.

## 2021-10-25 ENCOUNTER — Encounter: Payer: Self-pay | Admitting: Family Medicine

## 2021-10-26 ENCOUNTER — Other Ambulatory Visit: Payer: Self-pay

## 2021-10-26 ENCOUNTER — Encounter: Payer: Self-pay | Admitting: Cardiovascular Disease

## 2021-10-26 ENCOUNTER — Telehealth: Payer: Self-pay | Admitting: Family Medicine

## 2021-10-26 ENCOUNTER — Encounter: Payer: Self-pay | Admitting: Family Medicine

## 2021-10-26 MED ORDER — MECLIZINE HCL 25 MG PO TABS
25.0000 mg | ORAL_TABLET | Freq: Three times a day (TID) | ORAL | 0 refills | Status: DC | PRN
Start: 2021-10-26 — End: 2022-09-09

## 2021-10-26 MED ORDER — MECLIZINE HCL 25 MG PO TABS: 25.0000 mg | ORAL_TABLET | Freq: Three times a day (TID) | ORAL | 0 refills | Status: DC | PRN

## 2021-10-26 NOTE — Telephone Encounter (Signed)
  Prescription Request  10/26/2021  Is this a "Controlled Substance" medicine? no  Have you seen your PCP in the last 2 weeks? no  If YES, route message to pool  -  If NO, patient needs to be scheduled for appointment.  What is the name of the medication or equipment? meclizine (ANTIVERT) 25 MG tablet  Have you contacted your pharmacy to request a refill? Yes, they were told that [facility] is not opening and they do not know why    Which pharmacy would you like this sent to[facility] [place]     Patient notified that their request is being sent to the clinical staff for review and that they should receive a response within 2 business days.

## 2021-10-26 NOTE — Telephone Encounter (Signed)
Closing encounter, taken care of in a MyChart message

## 2021-10-27 ENCOUNTER — Other Ambulatory Visit: Payer: Self-pay | Admitting: Lab

## 2021-10-27 ENCOUNTER — Inpatient Hospital Stay: Payer: Medicare Other | Attending: Hematology and Oncology

## 2021-10-27 ENCOUNTER — Ambulatory Visit (HOSPITAL_COMMUNITY)
Admission: RE | Admit: 2021-10-27 | Discharge: 2021-10-27 | Disposition: A | Discharge status: Home / Self Care | Payer: Medicare Other | Source: Ambulatory Visit | Attending: Hematology and Oncology | Admitting: Hematology and Oncology

## 2021-10-27 ENCOUNTER — Other Ambulatory Visit: Payer: Self-pay

## 2021-10-27 DIAGNOSIS — C8280 Other types of follicular lymphoma, unspecified site: Secondary | ICD-10-CM | POA: Insufficient documentation

## 2021-10-27 DIAGNOSIS — C8293 Follicular lymphoma, unspecified, intra-abdominal lymph nodes: Secondary | ICD-10-CM | POA: Diagnosis not present

## 2021-10-27 LAB — CBC WITH DIFFERENTIAL/PLATELET
Abs Immature Granulocytes: 0.02 10*3/uL (ref 0.00–0.07)
Basophils Absolute: 0.1 10*3/uL (ref 0.0–0.1)
Basophils Relative: 1 %
Eosinophils Absolute: 0.5 10*3/uL (ref 0.0–0.5)
Eosinophils Relative: 6 %
HCT: 49.3 % (ref 39.0–52.0)
Hemoglobin: 16.7 g/dL (ref 13.0–17.0)
Immature Granulocytes: 0 %
Lymphocytes Relative: 18 %
Lymphs Abs: 1.7 10*3/uL (ref 0.7–4.0)
MCH: 29.7 pg (ref 26.0–34.0)
MCHC: 33.9 g/dL (ref 30.0–36.0)
MCV: 87.6 fL (ref 80.0–100.0)
Monocytes Absolute: 0.8 10*3/uL (ref 0.1–1.0)
Monocytes Relative: 8 %
Neutro Abs: 6.3 10*3/uL (ref 1.7–7.7)
Neutrophils Relative %: 67 %
Platelets: 195 10*3/uL (ref 150–400)
RBC: 5.63 MIL/uL (ref 4.22–5.81)
RDW: 12.4 % (ref 11.5–15.5)
WBC: 9.5 10*3/uL (ref 4.0–10.5)
nRBC: 0 % (ref 0.0–0.2)

## 2021-10-27 LAB — COMPREHENSIVE METABOLIC PANEL
ALT: 15 U/L (ref 0–44)
AST: 17 U/L (ref 15–41)
Albumin: 4.4 g/dL (ref 3.5–5.0)
Alkaline Phosphatase: 64 U/L (ref 38–126)
Anion gap: 7 (ref 5–15)
BUN: 13 mg/dL (ref 8–23)
CO2: 30 mmol/L (ref 22–32)
Calcium: 9.7 mg/dL (ref 8.9–10.3)
Chloride: 104 mmol/L (ref 98–111)
Creatinine, Ser: 1.1 mg/dL (ref 0.61–1.24)
GFR, Estimated: >60 mL/min (ref >60)
Glucose, Bld: 142 mg/dL — ABNORMAL HIGH (ref 70–99)
Potassium: 4.4 mmol/L (ref 3.5–5.1)
Sodium: 141 mmol/L (ref 135–145)
Total Bilirubin: 0.5 mg/dL (ref 0.3–1.2)
Total Protein: 6.9 g/dL (ref 6.5–8.1)

## 2021-10-27 MED ORDER — IOHEXOL 300 MG/ML  SOLN
100.0000 mL | Freq: Once | INTRAMUSCULAR | Status: AC | PRN
  Administered 2021-10-27: 100 mL via INTRAVENOUS

## 2021-10-27 MED ORDER — SODIUM CHLORIDE (PF) 0.9 % IJ SOLN
INTRAMUSCULAR | Status: AC
  Filled 2021-10-27: qty 50

## 2021-10-28 DIAGNOSIS — Z961 Presence of intraocular lens: Secondary | ICD-10-CM | POA: Diagnosis not present

## 2021-10-28 DIAGNOSIS — H2512 Age-related nuclear cataract, left eye: Secondary | ICD-10-CM | POA: Diagnosis not present

## 2021-10-28 DIAGNOSIS — H401131 Primary open-angle glaucoma, bilateral, mild stage: Secondary | ICD-10-CM | POA: Diagnosis not present

## 2021-10-29 ENCOUNTER — Encounter: Payer: Self-pay | Admitting: Hematology and Oncology

## 2021-10-29 ENCOUNTER — Ambulatory Visit: Payer: Medicare Other | Admitting: Hematology and Oncology

## 2021-10-29 ENCOUNTER — Inpatient Hospital Stay (HOSPITAL_BASED_OUTPATIENT_CLINIC_OR_DEPARTMENT_OTHER): Payer: Medicare Other | Admitting: Hematology and Oncology

## 2021-10-29 DIAGNOSIS — C8290 Follicular lymphoma, unspecified, unspecified site: Secondary | ICD-10-CM | POA: Diagnosis not present

## 2021-10-29 DIAGNOSIS — C8280 Other types of follicular lymphoma, unspecified site: Secondary | ICD-10-CM

## 2021-10-29 MED ORDER — XIIDRA 5 % OP SOLN: 1.0000 [drp] | Freq: Every day | OPHTHALMIC | Status: DC

## 2021-10-29 NOTE — Progress Notes (Signed)
HEMATOLOGY-ONCOLOGY ELECTRONIC VISIT PROGRESS NOTE  Patient Care Team: Dettinger, [name], MD as PCP - General (Family Medicine) Branch, Alphonse Guild, MD as PCP - Cardiology (Cardiology) Mealor, Yetta Barre, MD as PCP - Electrophysiology (Cardiology) [name], MD as Consulting Physician (Gastroenterology) [name], [name], MD as Consulting Physician (Dermatology)  I connected with the patient via telephone conference and verified that I am speaking with the correct person using two identifiers. The patient's location is at home and I am providing care from the [facility] I discussed the limitations, risks, security and privacy concerns of performing an evaluation and management service by e-visits and the availability of in person appointments.  I also discussed with the patient that there may be a patient responsible charge related to this service. The patient expressed understanding and agreed to proceed.   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 Thera Flake at [facility] for further discussion and treatment options  No orders of the defined types were placed in this encounter.   INTERVAL HISTORY: Please see below for problem oriented charting. 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   12/12/2003 - 06/01/2004 Chemotherapy   He was treated with R. CHOP chemotherapy which show complete remission. The number of cycles of R. CHOP chemotherapy was unknown.   12/19/2006 Surgery   Lung resection show follicular lymphoma.   01/02/2007 - 09/01/2008 Chemotherapy   The patient was treated with single agent rituximab alone.   01/19/2007 Bone Marrow Biopsy   Bone marrow biopsy was negative.   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   05/08/2013 - 05/11/2013 Hospital Admission   The patient was admitted to the hospital for management of pericarditis. CT scan showed extensive lymphadenopathy.   06/07/2013 Imaging   PET/CT scan showed extensive lymphadenopathy   06/25/2013 Procedure   He has placement of Infuse-a-Port.   06/28/2013 Bone Marrow Biopsy   Bone marrow biopsy is positive for lymphoma involvement.   07/04/2013 - 11/22/2013 Chemotherapy   He is treated with 6 cycles of bendamustine with rituximab.   09/24/2013 Imaging   Repeat PET scan show complete remission.   12/26/2013 Imaging   PEt scan showed complete remission   09/26/2014 Imaging   CT scan of the chest abdomen and pelvis show no evidence of disease   03/26/2015  Imaging   CT scan showed no evidence of lymphoma   04/14/2016 Imaging   CT: Borderline prominent right hilar and subcarinal lymph nodes, but not appreciably changed. 2. Low-grade but increased central mesenteric stranding with some small  mesenteric lymph nodes. This could certainly be inflammatory, 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, much of which is likely a physiologic response to cholecystectomy. 7. Sigmoid colon diverticulosis. 8.  Prominent stool throughout the colon favors constipation. 9. Enlarged prostate gland, volume estimated at 75 cubic cm.   02/15/2017 Imaging   No evidence of recurrent lymphoma or other acute findings.  Stable moderate hiatal hernia.  Colonic diverticulosis, without radiographic evidence of diverticulitis.  Stable mildly enlarged prostate.  Mild emphysema.  Aortic and coronary artery atherosclerosis.   01/31/2019 Imaging   1. Interval development of abdominal and pelvic adenopathy compatible with recurrent lymphoma. 2. Emphysema and aortic atherosclerosis. 3. Multi vessel coronary artery calcifications. 4. Moderate to large hiatal hernia   02/09/2019 Procedure   Successful CT-guided core biopsy of the left retroperitoneal periaortic adenopathy   02/09/2019 Pathology Results   SURGICAL PATHOLOGY  CASE: [id number]  PATIENT: [name]  Surgical Pathology Report   Clinical History: Lymphoma; [name], LEFT PARA AORTIC, [name], cleaved nuclei and scant cytoplasm.  By immunohistochemistry, the lymphocytes are predominantly B cells which are positive  for CD20, [name], [name], [name] is low (less than 10%).  Flow cytometry was attempted; however, there was insufficient material for analysis (see [id number]).  Overall, the features are consistent with relapse of the patient's previously diagnosed follicular lymphoma.  Based on the biopsy, this is favored to be a low-grade follicular lymphoma   05/18/2618 -  Chemotherapy   The patient had idelisib for chemotherapy treatment.     05/17/2019 Imaging   1. Interval generalized decrease in abdominopelvic lymphadenopathy identified on the previous study. No new or progressive lymphadenopathy on today's study. 2. Moderate hiatal hernia. 3.  Emphysema (ICD10-J43.9) and Aortic Atherosclerosis (ICD10-170.0)   11/16/2019 Imaging   IMPRESSION: Chest Impression:   1. No mediastinal lymphadenopathy. 2. Post RIGHT lung wedge resection without evidence local recurrence.   Abdomen / Pelvis Impression:   1. Stable numerous small periaortic retroperitoneal nodes and common iliac nodes. No progression of adenopathy. 2. No splenomegaly.  No skeletal metastasis.     07/31/2020 Imaging   1. Stable examination. No significant interval change in the prominent/mildly enlarged lymph nodes below the diaphragm. No thoracic adenopathy. No splenomegaly. 2. Postsurgical change of right lung wedge resection without evidence of local recurrence. 3. Large hiatal hernia. 4. Colonic diverticulosis without findings of acute diverticulitis. 5. Aortic Atherosclerosis (ICD10-I70.0) and Emphysema (ICD10-J43.9).     01/29/2021 Imaging   1. Overall mild interval enlargement of lymph nodes in the abdomen and  pelvis as described. 2. No lymphadenopathy identified in the chest. 3. Emphysematous changes of the lungs. 4. Large hiatal hernia.  Extensive colonic diverticulosis. 5.  Other ancillary findings as described.     04/30/2021 Imaging   Chest Impression:   1. No mediastinal lymphadenopathy. 2. Large hiatal hernia. 3. Retrocrural node slightly decreased in size.   Abdomen / Pelvis Impression:   1. Cluster of numerous small retroperitoneal periaortic lymph nodes. Lymph nodes are decreased slightly in size compared to recent CT scan. 2. Likewise reduction in LEFT external iliac small lymph node. 3. No evidence of progression of lymphoma. 4. Normal spleen. 5. No bone involvement.   10/28/2021 Imaging   1. Overall trend towards progressive disease on today's exam. Lymph nodes in the hepatoduodenal ligament are upper normal for size but stable in the interval. Index retroperitoneal lymph nodes are progressive, notably in the right common iliac chain. Soft tissue nodule/lymph node in the retroperitoneal fat adjacent to the right iliacus muscle is progressive. 2. Large hiatal hernia. 3. Colonic diverticulosis without diverticulitis. 4. Prostatomegaly. 5. Aortic Atherosclerosis (ICD10-I70.0) and Emphysema (ICD10-J43.9).     REVIEW OF SYSTEMS:   Constitutional: Denies fevers, chills or abnormal weight loss Eyes: Denies blurriness of vision Ears, nose, mouth, throat, and face: Denies mucositis or sore throat Respiratory: Denies cough, dyspnea or wheezes Cardiovascular: Denies palpitation, chest discomfort Gastrointestinal:  Denies nausea, heartburn or change in bowel habits Skin: Denies abnormal skin rashes Lymphatics: Denies new lymphadenopathy or easy bruising Neurological:Denies numbness, tingling or new weaknesses Behavioral/Psych: Mood is stable, no new changes  Extremities: No lower extremity edema All other systems were reviewed with the patient and are negative.  I have reviewed the past medical history, past surgical history, social history and family history with the patient and they are unchanged from previous note.  ALLERGIES:  has No Known  Allergies.  MEDICATIONS:  Current Outpatient Medications  Medication Sig Dispense Refill   apixaban (ELIQUIS) 5 MG TABS tablet TAKE 1 TABLET TWICE A DAY 180 tablet 1   Ascorbic Acid (VITAMIN C) 1000 MG tablet Take 1,000 mg by mouth daily.     brimonidine (ALPHAGAN) 0.2 % ophthalmic solution Place 1 drop into both eyes 2 (two) times daily.      cholecalciferol (VITAMIN D3) 25 MCG (1000 UNIT) tablet Take 1,000 Units by mouth daily.     cyclobenzaprine (FLEXERIL) 10 MG tablet Take 10 mg by mouth 2 (two) times daily as needed for muscle spasms.     Lifitegrast (XIIDRA) 5 % SOLN Place 1 drop into both eyes daily.     LUMIGAN 0.01 % SOLN Place 1 drop into both eyes daily.     meclizine (ANTIVERT) 25 MG tablet Take 1 tablet (25 mg total) by mouth 3 (three) times daily as needed for dizziness. 30 tablet 0   metoprolol succinate (TOPROL-XL) 25 MG 24 hr tablet Take 0.5 tablets (12.5 mg total) by mouth 2 (two) times daily. 90 tablet 1   Multiple Vitamin (MULTIVITAMIN WITH MINERALS) TABS tablet Take 1 tablet by mouth daily.      omeprazole (PRILOSEC) 20 MG capsule Take 1 capsule (20 mg total) by mouth daily. (Patient taking differently: Take 20 mg by mouth every other day.) 90 capsule 3   Probiotic Product (SUPERIOR PROBIOTIC) CAPS      rosuvastatin (CRESTOR) 20 MG tablet TAKE 1 TABLET DAILY 90 tablet 3   silodosin (RAPAFLO) 8 MG CAPS capsule Take 1 capsule (8 mg total) by mouth daily with breakfast. 90  capsule 3   No current facility-administered medications for this visit.    PHYSICAL EXAMINATION: ECOG PERFORMANCE STATUS: 0 - Asymptomatic  LABORATORY DATA:  I have reviewed the data as listed    Latest Ref Rng & Units 10/27/2021    7:49 AM 10/05/2021   10:16 AM 04/28/2021    8:28 AM  CMP  Glucose 70 - 99 mg/dL 142  131  75   BUN 8 - 23 mg/dL _0 Creatinine 0.61 - 1.24 mg/dL 1.10  1.23  1.07   Sodium 135 - 145 mmol/L 141  139  141   Potassium 3.5 - 5.1 mmol/L 4.4  5.2  4.4    Chloride 98 - 111 mmol/L 104  100  105   CO2 22 - 32 mmol/L _1 Calcium 8.9 - 10.3 mg/dL 9.7  9.7  9.3   Total Protein 6.5 - 8.1 g/dL 6.9  6.4  6.4   Total Bilirubin 0.3 - 1.2 mg/dL 0.5  0.6  0.5   Alkaline Phos 38 - 126 U/L 64  64  50   AST 15 - 41 U/L _2 ALT 0 - 44 U/L _3 Lab Results  Component Value Date   WBC 9.5 10/27/2021   HGB 16.7 10/27/2021   HCT 49.3 10/27/2021   MCV 87.6 10/27/2021   PLT 195 10/27/2021   NEUTROABS 6.3 10/27/2021     RADIOGRAPHIC STUDIES: I have personally reviewed the radiological images as listed and agreed with the findings in the report. CT CHEST ABDOMEN PELVIS W CONTRAST  Result Date: 10/28/2021 CLINICAL DATA:  Lymphoma.  Restaging. * Tracking Code: BO * EXAM: CT CHEST, ABDOMEN, AND PELVIS WITH CONTRAST TECHNIQUE: Multidetector CT imaging of the chest, abdomen and pelvis was performed following the standard protocol during bolus administration of intravenous contrast. RADIATION DOSE REDUCTION: This exam was performed according to the departmental dose-optimization program which includes automated exposure control, adjustment of the mA and/or kV according to patient size and/or use of iterative reconstruction technique. CONTRAST:  114m OMNIPAQUE IOHEXOL 300 MG/ML  SOLN COMPARISON:  04/28/2021 FINDINGS: CT CHEST FINDINGS Cardiovascular: The heart size is normal. No substantial pericardial effusion. Coronary artery calcification is evident. Mild atherosclerotic calcification is noted in the wall of the thoracic aorta. Mediastinum/Nodes: No mediastinal lymphadenopathy. There is no hilar lymphadenopathy. There is no axillary lymphadenopathy. Large hiatal hernia. Lungs/Pleura: Centrilobular and paraseptal emphysema evident. Staple line noted right lower lobe. No new suspicious pulmonary nodule or mass. No focal airspace consolidation. No pleural effusion. Musculoskeletal: No worrisome lytic or sclerotic osseous abnormality.  Perineural root sleeve cysts are noted at multiple levels in the lower thoracic spine. CT ABDOMEN PELVIS FINDINGS Hepatobiliary: No suspicious focal abnormality within the liver parenchyma. Stable tiny hypodensity lateral segment left liver is too small to characterize but likely a benign cyst. Gallbladder surgically absent. Stable prominence of the common bile duct, likely related to prior surgery. Pancreas: No focal mass lesion. No dilatation of the main duct. No intraparenchymal cyst. No peripancreatic edema. Spleen: No splenomegaly. No focal mass lesion. Adrenals/Urinary Tract: No adrenal nodule or mass. Right kidney unremarkable. Left renal cysts again noted No evidence for hydroureter. The urinary bladder appears normal for the degree of distention. Stomach/Bowel: Large hiatal hernia. Duodenum is normally positioned as is the ligament of Treitz. No small bowel wall thickening. No small bowel dilatation. The terminal ileum  is normal. The appendix is not well visualized, but there is no edema or inflammation in the region of the cecum. Diverticuli are seen scattered along the entire length of the colon without CT findings of diverticulitis. Advanced diverticular disease noted sigmoid colon. Vascular/Lymphatic: There is moderate atherosclerotic calcification of the abdominal aorta without aneurysm. 11 mm short axis portal caval node on 61/2 is stable. Other small to upper normal hepatoduodenal ligament lymph nodes are unchanged. Index precaval node measured previously at 9 mm short axis is 12 mm short axis today on image 90/2. Adjacent para-aortic node measuring 8 mm short axis on 90/2 was 6 mm previously (remeasured). 21 mm right common iliac node on 96/2 was 11 mm previously (remeasured). 12 mm short axis retroperitoneal nodule adjacent to the right iliacus muscle (89/2) was 4 mm previously. No pelvic sidewall or groin lymphadenopathy. Reproductive: Prostate gland is enlarged. Other: No intraperitoneal free  fluid. Musculoskeletal: No worrisome lytic or sclerotic osseous abnormality. IMPRESSION: 1. Overall trend towards progressive disease on today's exam. Lymph nodes in the hepatoduodenal ligament are upper normal for size but stable in the interval. Index retroperitoneal lymph nodes are progressive, notably in the right common iliac chain. Soft tissue nodule/lymph node in the retroperitoneal fat adjacent to the right iliacus muscle is progressive. 2. Large hiatal hernia. 3. Colonic diverticulosis without diverticulitis. 4. Prostatomegaly. 5. Aortic Atherosclerosis (ICD10-I70.0) and Emphysema (ICD10-J43.9). Electronically Signed   By: Misty Stanley M.D.   On: 10/28/2021 08:28    I discussed the assessment and treatment plan with the patient. The patient was provided an opportunity to ask questions and all were answered. The patient agreed with the plan and demonstrated an understanding of the instructions. The patient was advised to call back or seek an in-person evaluation if the symptoms worsen or if the condition fails to improve as anticipated.    I spent 30 minutes for the appointment reviewing test results, discuss management and coordination of care.  [name], MD 10/29/2021 10:49 AM

## 2021-10-29 NOTE — Assessment & Plan Note (Signed)
I have reviewed imaging study with the patient and his wife There is an enlarging right para-aortic lymph node suspicious for early recurrence In 2021, he had biopsy of the left para-aortic lymph node that confirmed recurrence The pattern that I see on CT imaging is most consistent with recurrent lymphoma We discussed treatment options and recommendation from [facility] His wife is not keen for him to restart any form of oral chemotherapy due to previous undesirable side effects with idelalisib I recommend return visit to Dr. Thera Flake at [facility] for further discussion and treatment options

## 2021-11-02 DIAGNOSIS — C8280 Other types of follicular lymphoma, unspecified site: Secondary | ICD-10-CM | POA: Diagnosis not present

## 2021-11-02 DIAGNOSIS — R591 Generalized enlarged lymph nodes: Secondary | ICD-10-CM | POA: Diagnosis not present

## 2021-11-02 DIAGNOSIS — J432 Centrilobular emphysema: Secondary | ICD-10-CM | POA: Diagnosis not present

## 2021-11-06 DIAGNOSIS — R002 Palpitations: Secondary | ICD-10-CM | POA: Diagnosis not present

## 2021-11-06 DIAGNOSIS — I480 Paroxysmal atrial fibrillation: Secondary | ICD-10-CM | POA: Diagnosis not present

## 2021-11-30 ENCOUNTER — Ambulatory Visit: Payer: Medicare Other | Attending: Cardiovascular Disease | Admitting: Cardiovascular Disease

## 2021-11-30 ENCOUNTER — Encounter: Payer: Self-pay | Admitting: Cardiovascular Disease

## 2021-11-30 VITALS — BP 108/62 | HR 74 | Ht 72.0 in | Wt 190.6 lb

## 2021-11-30 DIAGNOSIS — Z8679 Personal history of other diseases of the circulatory system: Secondary | ICD-10-CM | POA: Insufficient documentation

## 2021-11-30 DIAGNOSIS — I480 Paroxysmal atrial fibrillation: Secondary | ICD-10-CM | POA: Insufficient documentation

## 2021-11-30 NOTE — Patient Instructions (Signed)
Medication Instructions:  Your physician recommends that you continue on your current medications as directed. Please refer to the Current Medication list given to you today.  *If you need a refill on your cardiac medications before your next appointment, please call your pharmacy*   Follow-Up: At [facility], you and your health needs are our priority.  As part of our continuing mission to provide you with exceptional heart care, we have created designated Provider Care Teams.  These Care Teams include your primary Cardiologist (physician) and Advanced Practice Providers (APPs -  Physician Assistants and Nurse Practitioners) who all work together to provide you with the care you need, when you need it.  Your next appointment:   We will be in contact with you after we hear back from your oncologist.   Important Information About Sugar

## 2021-11-30 NOTE — Progress Notes (Signed)
Cardiology Office Note:    Date:  11/30/2021   ID:  Anthony Kelly, DOB February 27, 1945, MRN [id number]  PCP:  Dettinger, [name], MD   [place] [facility] Cardiologist:  Carlyle Dolly, MD Electrophysiologist:  Melida Quitter, MD     Referring MD: Dettinger, [name], MD    History of Present Illness:    Anthony Kelly is a 76 y.o. male with a hx of CAD, Lymphoma, NSVT and PAF who follows-up in electrophysiology clinic for arrhythmia management.  He was last seen by Dr. Rayann Heman in September of 2022. At that time, he reported that he was doing well -- not having any palpitations on flecainide.  Today, he reports that he is doing quite well. He has occasional palpitations, but it's nothing compared to when he was initially receiving chemotherapy. He uses his apple watch to capture an ECG when he has palpitations. I reviewed four or five strips. They all showed sinus rhythm with sinus arrhythmia except one, which showed a supraventricular couplet.  11/30/2021 At our last visit, we discontinued flecainide and placed a monitor.  The monitor showed frequent PACs and occasional runs of atrial tachycardia.  Since wearing the monitor, he increase his activity level and received 3 A-fib notifications from his [place].  I reviewed these today and they do in fact show atrial fibrillation.  He has had symptoms of palpitations that correspond with documented arrhythmia.  Otherwise, he reports that he has no complaints - no chest pain, syncope, presyncope, shortness of breath.    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 04/2013 -adm with CP, elevated CRP. H/o coronary artery calcification on prior CT but nuc was negative, EF 71%.   Arthritis    Atrial fibrillation ([place])    a. Isolated episode in the setting of acute pericarditis 04/2013. Was not placed on anticoag.   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   lt submandibular ln   Pain in joint, pelvic region and thigh 08/01/2013   PSVT (paroxysmal supraventricular tachycardia)    SCC (squamous cell carcinoma)    Left cheek    Past Surgical History:  Procedure Laterality Date   CHOLECYSTECTOMY     EXPLORATORY LAPAROTOMY     EYE SURGERY Right 2016   cataract   LEFT HEART CATH AND CORONARY ANGIOGRAPHY N/A 06/05/2019   Procedure: LEFT HEART CATH AND CORONARY ANGIOGRAPHY;  Surgeon: Jettie Booze, MD;  Location: [facility];  Service: Cardiovascular;  Laterality: N/A;   LUNG LOBECTOMY     right side   LYMPH NODE BIOPSY     in groin with removal   MENISCUS REPAIR     right knee   MOHS SURGERY Left 09/2020   cheek   PROSTATE SURGERY     REFRACTIVE SURGERY  Right    piece of metal removed   SUBMANDIBULAR GLAND EXCISION  04/2011   SUBMANDIBULAR GLAND EXCISION  04/30/2011   Procedure: EXCISION SUBMANDIBULAR GLAND;  Surgeon: Jerrell Belfast, MD;  Location: [facility];  Service: ENT;  Laterality: Left;  WITH DIAGNOSTIC BIOPSY   TONSILLECTOMY     as a child   [place]     right leg    Current Medications: No outpatient medications have been marked as taking for the 11/30/21 encounter (Appointment) with Bonetta Mostek, Yetta Barre, MD.     Allergies:   Patient has no known allergies.   Social History   Socioeconomic History   Marital  status: Married    Spouse name: [name]   Number of children: 2   Years of education: GED   Highest education level: GED or equivalent  Occupational History   Occupation: Retired     Comment: Secretary/administrator   Occupation: Retired     Comment: [employer]  Tobacco Use   Smoking status: Former    Packs/day: 1.00    Years: 15.00    Total pack years: 15.00    Types: Cigarettes    Start date: 12/12/1962    Quit date: 08/25/1984    Years since quitting: 37.2   Smokeless tobacco: Never  Vaping Use   Vaping Use: Never used  Substance and Sexual Activity   Alcohol use: Not Currently   Drug use: Never   Sexual activity: Not Currently  Other Topics Concern   Not on file  Social History Narrative   Right handed   Decaf coffee (2-3 per day)   Lives with wife   Social Determinants of Health   Financial Resource Strain: Low Risk  (02/25/2021)   Overall Financial Resource Strain (CARDIA)    Difficulty of Paying Living Expenses: Not hard at all  Food Insecurity: No Food Insecurity (02/25/2021)   Hunger Vital Sign    Worried About Running Out of Food in the Last Year: Never true    [place] in the Last Year: Never true  Transportation Needs: No Transportation Needs (02/25/2021)   PRAPARE - Hydrologist (Medical): No    Lack of Transportation (Non-Medical): No  Physical Activity: Inactive (02/25/2021)   Exercise Vital Sign    Days of Exercise per Week: 0 days    Minutes of Exercise per Session: 0 min  Stress: No Stress Concern Present (02/25/2021)   [place]    Feeling of Stress : Not at all  Social Connections: Socially Isolated (02/25/2021)   Social Connection and Isolation Panel [NHANES]    Frequency of Communication with Friends and Family: Once a week    Frequency of Social Gatherings with Friends and Family: Once a week    Attends Religious Services: Never    Corporate treasurer or Organizations: No    Attends Music therapist: Never    Marital Status: Married     Family History: The patient's family history includes Alzheimer's disease in his brother and sister; Cancer in his brother, brother, father, sister, and sister; Diabetes in his brother, sister, and son; Heart attack in his father and sister; Heart disease in his brother, brother, brother, brother, brother, brother, brother, and father. There is no history of Anesthesia problems.  ROS:   Please see the history of present illness.    All other systems reviewed and are negative.  EKGs/Labs/Other Studies Reviewed:     TTE 8/32/2022 EF 60-65%. Normal structure and function with mild LVH  Coronary angiogram 06/05/2019: Prox RCA lesion is 25% stenosed. Ramus lesion is 50% stenosed. Prox LAD to Mid LAD lesion is 25% stenosed. Severe right subclavian tortuosity made torquing catheters difficult. If cath was needed in the future, would consider alternate access site. The left ventricular systolic function is normal. LV end diastolic pressure is normal. The left ventricular ejection fraction is 55-65% by visual estimate. There is no aortic valve stenosis.  7 day Patch monitor:   Occasional supraventricular ectopy in the form of isolated PACs, couplets. Rare triplets. 54 runs of SVT longest 19.2 seconds.   Rare ventricular ectopy in the form of isolated PVCs   Reported symptoms correlated with PACs and short runs of SVT. No AF detected  EKG:  Last EKG results: 08/2021 Sinus rhythm   Recent Labs: 10/27/2021: ALT 15; BUN 13; Creatinine, Ser 1.10; Hemoglobin 16.7; Platelets 195; Potassium 4.4; Sodium 141    Risk Assessment/Calculations:    CHA2DS2-VASc Score = 3   This indicates a 3.2% annual risk of stroke. The patient's score is based upon: CHF History: 0 HTN History: 0 Diabetes History: 0 Stroke History: 0 Vascular Disease History: 1 Age Score: 2 Gender Score: 0          Physical Exam:    VS:  There were no vitals taken for this visit.    Wt Readings from Last 3 Encounters:  10/20/21 189 lb 3.2 oz (85.8 kg)  10/05/21 189 lb (85.7 kg)  08/25/21 194 lb 3.2 oz (88.1 kg)     GEN:  Well nourished, well developed in no acute distress CARDIAC: RRR, no murmurs, rubs, gallops RESPIRATORY:  Normal work of breathing MUSCULOSKELETAL: no edema    ASSESSMENT & PLAN:    Paroxysmal atrial fibrillation: having increased episodes off flecainide. I am hesitant to resume flecainide long term due to CAD. He is interested in having an ablation. He will go to [facility] to discuss therapy for lymphoma on the 29th of this month. We will readdress our management plans when he has a better idea what therapy for lymphoma will entail. His AF diagnosis should not delay or interfere with treatment of his cancer. Continue eliquis. Palpitations -- PACs, PVCs CAD: mild, non-hemodynamically significant CAD on cath from 2021. Increases risk of malignant, life threatening arrhythmia from flecainide - I did inform the patient of this. Lymphoma      Follow-up 6 weeks  Medication Adjustments/Labs and Tests Ordered: Current medicines are reviewed at length with the patient today.  Concerns regarding medicines are outlined above.  No orders of the defined types were placed in this encounter.  No orders of the defined types were placed in this encounter.    Signed, Melida Quitter, MD  11/30/2021 10:39 AM    [place]

## 2021-12-09 DIAGNOSIS — C8223 Follicular lymphoma grade III, unspecified, intra-abdominal lymph nodes: Secondary | ICD-10-CM | POA: Diagnosis not present

## 2021-12-09 DIAGNOSIS — C8290 Follicular lymphoma, unspecified, unspecified site: Secondary | ICD-10-CM | POA: Diagnosis not present

## 2021-12-10 ENCOUNTER — Telehealth: Payer: Self-pay | Admitting: Cardiovascular Disease

## 2021-12-10 DIAGNOSIS — I480 Paroxysmal atrial fibrillation: Secondary | ICD-10-CM

## 2021-12-10 NOTE — Telephone Encounter (Signed)
Patient saw oncology yesterday and he was told he did not need to start therapy at this time. They will continue to monitor and get a PET scan next February. Wife states that his oncologist thought it would be reasonable to proceed with ablation if recommended by cardiology prior to starting therapy. Per Dr. Karel Jarvis last office note we would readdress AFIB management plans after he saw oncology. Will send to Dr. Myles Gip for recommendations on how to proceed.

## 2021-12-10 NOTE — Telephone Encounter (Signed)
[name] calling to schedule ablation.

## 2021-12-13 ENCOUNTER — Encounter: Payer: Self-pay | Admitting: Cardiovascular Disease

## 2021-12-13 DIAGNOSIS — I480 Paroxysmal atrial fibrillation: Secondary | ICD-10-CM

## 2021-12-14 NOTE — Telephone Encounter (Signed)
See MyChart encounter

## 2021-12-14 NOTE — Addendum Note (Signed)
Addended by: Bernestine Amass on: 12/14/2021 10:59 AM   Modules accepted: Orders

## 2022-01-05 ENCOUNTER — Ambulatory Visit: Payer: Medicare Other | Admitting: Urology

## 2022-01-06 ENCOUNTER — Ambulatory Visit: Payer: Medicare Other | Admitting: Urology

## 2022-01-12 ENCOUNTER — Ambulatory Visit: Payer: Medicare Other | Admitting: Urology

## 2022-01-12 DIAGNOSIS — Z86008 Personal history of in-situ neoplasm of other site: Secondary | ICD-10-CM | POA: Diagnosis not present

## 2022-01-12 DIAGNOSIS — L570 Actinic keratosis: Secondary | ICD-10-CM | POA: Diagnosis not present

## 2022-01-12 DIAGNOSIS — L821 Other seborrheic keratosis: Secondary | ICD-10-CM | POA: Diagnosis not present

## 2022-01-12 DIAGNOSIS — L814 Other melanin hyperpigmentation: Secondary | ICD-10-CM | POA: Diagnosis not present

## 2022-01-12 DIAGNOSIS — D0462 Carcinoma in situ of skin of left upper limb, including shoulder: Secondary | ICD-10-CM | POA: Diagnosis not present

## 2022-01-12 DIAGNOSIS — L2089 Other atopic dermatitis: Secondary | ICD-10-CM | POA: Diagnosis not present

## 2022-01-18 ENCOUNTER — Other Ambulatory Visit: Payer: Self-pay | Admitting: Cardiology

## 2022-01-18 NOTE — Telephone Encounter (Signed)
Prescription refill request for Eliquis received. Indication: PAF Last office visit:  11/30/21  A Mealor MD Scr: 0.92 on 12/09/21 Age: 77 Weight: 86.5kg  Based on above findings Eliquis 5mg  twice daily is the appropriate dose.  Refill approved.

## 2022-01-22 ENCOUNTER — Ambulatory Visit (INDEPENDENT_AMBULATORY_CARE_PROVIDER_SITE_OTHER): Payer: Medicare Other | Admitting: Urology

## 2022-01-22 VITALS — BP 126/79 | HR 89

## 2022-01-22 DIAGNOSIS — R351 Nocturia: Secondary | ICD-10-CM

## 2022-01-22 DIAGNOSIS — N138 Other obstructive and reflux uropathy: Secondary | ICD-10-CM | POA: Diagnosis not present

## 2022-01-22 DIAGNOSIS — R339 Retention of urine, unspecified: Secondary | ICD-10-CM | POA: Diagnosis not present

## 2022-01-22 DIAGNOSIS — N401 Enlarged prostate with lower urinary tract symptoms: Secondary | ICD-10-CM | POA: Diagnosis not present

## 2022-01-22 LAB — URINALYSIS, ROUTINE W REFLEX MICROSCOPIC
Bilirubin, UA: NEGATIVE
Glucose, UA: NEGATIVE
Ketones, UA: NEGATIVE
Leukocytes,UA: NEGATIVE
Nitrite, UA: NEGATIVE
Protein,UA: NEGATIVE
RBC, UA: NEGATIVE
Specific Gravity, UA: 1.01 (ref 1.005–1.030)
Urobilinogen, Ur: 0.2 mg/dL (ref 0.2–1.0)
pH, UA: 6.5 (ref 5.0–7.5)

## 2022-01-22 LAB — BLADDER SCAN AMB NON-IMAGING: Scan Result: 227

## 2022-01-22 MED ORDER — FINASTERIDE 5 MG PO TABS: 5.0000 mg | ORAL_TABLET | Freq: Every day | ORAL | 3 refills | Status: DC

## 2022-01-22 MED ORDER — SILODOSIN 8 MG PO CAPS: 8.0000 mg | ORAL_CAPSULE | Freq: Every day | ORAL | 3 refills | Status: DC

## 2022-01-22 NOTE — Progress Notes (Unsigned)
01/22/2022 12:12 PM   Anthony Kelly [birth year] [id number]  Referring provider: Dettinger, Elige Radon, MD [address] [place],  [place] [address]  Followup BPH   HPI: Anthony Kelly is a 76yo here for followup for BPH and incomplete emptying. PVR 227cc. IPSS 24 QOL 4 on rapaflo 8mg . Nocturia 3x. He has straining to urinate. He has an intermittent weak stream. He has a feeling of incomplete emptying. 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 04/2013 -adm with CP, elevated CRP. H/o coronary artery calcification on prior CT but nuc was negative, EF 71%.   Arthritis    Atrial fibrillation ([facility])    a. Isolated episode in the setting of acute pericarditis 04/2013. Was not placed on anticoag.   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   Metastasis to lung ([facility]) dx'd 01/2007   Metastasis to lymph nodes ([facility]) dx'd 03/2011   lt submandibular ln   Pain in joint, pelvic region and thigh 08/01/2013   PSVT (paroxysmal supraventricular tachycardia)    SCC (squamous cell carcinoma)    Left cheek    Surgical History: Past Surgical History:  Procedure Laterality Date   CHOLECYSTECTOMY     EXPLORATORY LAPAROTOMY     EYE SURGERY Right 2016   cataract   LEFT HEART CATH AND CORONARY ANGIOGRAPHY N/A 06/05/2019   Procedure: LEFT HEART CATH AND CORONARY ANGIOGRAPHY;  Surgeon: 06/07/2019, MD;  [facility]: [facility];  Service: Cardiovascular;  Laterality: N/A;   LUNG LOBECTOMY     right side   LYMPH NODE BIOPSY     in groin with removal   MENISCUS REPAIR     right knee   MOHS SURGERY Left 09/2020   cheek   PROSTATE SURGERY     REFRACTIVE SURGERY Right    piece of metal removed   SUBMANDIBULAR GLAND EXCISION  04/2011   SUBMANDIBULAR GLAND EXCISION  04/30/2011   Procedure: EXCISION SUBMANDIBULAR GLAND;  Surgeon: 05/02/2011, MD;  Location: [facility];  Service: ENT;  Laterality: Left;  WITH DIAGNOSTIC BIOPSY   TONSILLECTOMY     as a child   VEIN LIGATION AND STRIPPING     right leg    Home Medications:  Allergies as of 01/22/2022   No Known Allergies      Medication List        Accurate as of January 22, 2022 12:12 PM. If you have any questions, ask your nurse or doctor.          brimonidine 0.2 % ophthalmic solution Commonly known as: ALPHAGAN Place 1 drop into both eyes 2 (two) times daily.   cholecalciferol 25 MCG (1000 UNIT) tablet Commonly known as: VITAMIN D3 Take 1,000 Units by mouth daily.   cyclobenzaprine 10 MG tablet Commonly known as: FLEXERIL Take 10 mg by mouth 2 (two) times daily as  needed for muscle spasms.   Eliquis 5 MG Tabs tablet Generic drug: apixaban TAKE 1 TABLET TWICE A DAY   Lumigan 0.01 % Soln Generic drug: bimatoprost Place 1 drop into both eyes daily.   meclizine 25 MG tablet Commonly known as: ANTIVERT Take 1 tablet (25 mg total) by mouth 3 (three) times daily as needed for dizziness.   metoprolol succinate 25 MG 24 hr tablet Commonly known as: TOPROL-XL Take 0.5 tablets (12.5 mg total) by mouth 2 (two) times daily.   multivitamin with minerals Tabs tablet Take 1 tablet by mouth daily.   omeprazole 20 MG capsule Commonly known as: PRILOSEC Take 1 capsule (20 mg total) by mouth daily. What changed: when to take this   rosuvastatin 20 MG tablet Commonly known as: CRESTOR TAKE 1 TABLET DAILY   silodosin 8 MG Caps  capsule Commonly known as: RAPAFLO Take 1 capsule (8 mg total) by mouth daily with breakfast.   Superior Probiotic Caps   vitamin C 1000 MG tablet Take 1,000 mg by mouth daily.   Xiidra 5 % Soln Generic drug: Lifitegrast Place 1 drop into both eyes daily.        Allergies: No Known Allergies  Family History: Family History  Problem Relation Age of Onset   Heart disease Father    Heart attack Father        x 3   Cancer Father    Cancer Sister        breast ca   Cancer Brother        prostate ca   Heart attack Sister    Cancer Sister        breast   Heart disease Brother    Alzheimer's disease Sister    Diabetes Sister    Heart disease Brother    Diabetes Brother    Heart disease Brother    Heart disease Brother    Heart disease Brother    Heart disease Brother    Heart disease Brother    Alzheimer's disease Brother    Diabetes Son    Cancer Brother    Anesthesia problems Neg Hx     Social History:  reports that he quit smoking about 37 years ago. His smoking use included cigarettes. He started smoking about 59 years ago. He has a 15.00 pack-year smoking history. He has never used smokeless tobacco. He reports that he does not currently use alcohol. He reports that he does not use drugs.  ROS: All other review of systems were reviewed and are negative except what is noted above in HPI  Physical Exam: BP 126/79   Pulse 89   Constitutional:  Alert and oriented, No acute distress. HEENT: [place] AT, moist mucus membranes.  Trachea midline, no masses. Cardiovascular: No clubbing, cyanosis, or edema. Respiratory: Normal respiratory effort, no increased work of breathing. GI: Abdomen is soft, nontender, nondistended, no abdominal masses GU: No CVA tenderness.  Lymph: No cervical or inguinal lymphadenopathy. Skin: No rashes, bruises or suspicious lesions. Neurologic: Grossly intact, no focal deficits, moving all 4 extremities. Psychiatric: Normal mood and  affect.  Laboratory Data: Lab Results  Component Value Date   WBC 9.5 10/27/2021   HGB 16.7 10/27/2021   HCT 49.3 10/27/2021   MCV 87.6 10/27/2021   PLT 195 10/27/2021    Lab Results  Component Value Date   CREATININE 1.10 10/27/2021    Lab Results  Component Value Date   PSA 0.6 01/18/2014    Lab Results  Component Value Date   TESTOSTERONE 269 10/08/2015    Lab Results  Component Value Date   HGBA1C 6.2 (H) 10/05/2021    Urinalysis    Component Value Date/Time   COLORURINE YELLOW 12/07/2013 1630   APPEARANCEUR Clear 07/06/2021 1002   LABSPEC 1.009 12/07/2013 1630   PHURINE 6.0 12/07/2013 1630   GLUCOSEU Negative 07/06/2021 1002   HGBUR NEGATIVE 12/07/2013 1630   BILIRUBINUR Negative 07/06/2021 1002   KETONESUR NEGATIVE 12/07/2013 1630   PROTEINUR Negative 07/06/2021 1002   PROTEINUR NEGATIVE 12/07/2013 1630   UROBILINOGEN negative 08/09/2014 1207   UROBILINOGEN 0.2 12/07/2013 1630   NITRITE Negative 07/06/2021 1002   NITRITE NEGATIVE 12/07/2013 1630   LEUKOCYTESUR Negative 07/06/2021 1002    Lab Results  Component Value Date   LABMICR Comment 07/06/2021   WBCUA None seen 06/07/2017   RBCUA 0-2 06/07/2017   LABEPIT None seen 06/07/2017   MUCUS neg 08/09/2014   BACTERIA None seen 06/07/2017    Pertinent Imaging:  No results found for this or any previous visit.  No results found for this or any previous visit.  No results found for this or any previous visit.  No results found for this or any previous visit.  No results found for this or any previous visit.  No valid procedures specified. No results found for this or any previous visit.  No results found for this or any previous visit.   Assessment & Plan:    1. BPH with obstruction/lower urinary tract symptoms -continue rapaflo 8mg   - Urinalysis, Routine w reflex microscopic  2. Incomplete bladder emptying -continue rapaflo 8mg  and start finasteride - BLADDER SCAN AMB  NON-IMAGING  3. Nocturia -start finasteride 5mg  daily   No follow-ups on file.  Nicolette Bang, MD  [facility] [place]

## 2022-01-22 NOTE — Patient Instructions (Signed)
Hiperplasia prosttica benigna Benign Prostatic Hyperplasia  La hiperplasia prosttica benigna (HBP) es un aumento del tamao de la prstata que es causado por el proceso normal de envejecimiento. La prstata puede aumentar de tamao a medida que un hombre envejece. Esta afeccin no es causada por Management consultant. La prstata es una glndula del tamao de una nuez que participa en la produccin de semen. Est ubicada frente al recto y debajo de la vejiga. La vejiga almacena la orina. La uretra lleva la orina hacia fuera del cuerpo. Una prstata agrandada puede presionar la uretra. Esto puede dificultar el pasaje de la orina. La acumulacin de orina en la vejiga puede causar una infeccin. La presin y la infeccin pueden provocar daos en la vejiga y una insuficiencia en los riones (renal). Cules son las causas? Esta afeccin es parte del proceso normal de envejecimiento. Sin embargo, no todos los hombres desarrollan problemas por esta afeccin. Si la prstata se agranda lejos de la uretra, el flujo de orina no se obstruir. Si se agranda hacia la uretra y la comprime, habr problemas con el paso de la [place]. Qu incrementa el riesgo? Es ms probable que Copy se desarrolle en los hombres mayores de 50 aos. Cules son los signos o sntomas? Los sntomas de esta afeccin incluyen: Levantarse con frecuencia durante la noche para orinar. Necesidad de Geographical information systems officer con ms frecuencia Administrator. Dificultad para comenzar a eliminar la orina. Disminucin del tamao y de la fuerza del chorro de [place]. Prdida (goteo) luego de Geographical information systems officer. Imposibilidad para orinar. En este caso, es necesario un tratamiento inmediato. Imposibilidad para vaciar la vejiga completamente. Dolor al Beatrix Shipper. Esto es ms comn si tambin hay una infeccin. Infeccin de las vas urinarias (IU). Cmo se diagnostica? Esta afeccin se diagnostica en funcin de los antecedentes mdicos, un examen fsico y los sntomas. Tambin se  le realizarn pruebas, por ejemplo: Un estudio despus de vaciar la vejiga. Este mide la cantidad de orina que queda en la vejiga despus de terminar de Geographical information systems officer. Examen rectal digital. En un examen rectal, el mdico controla la prstata colocando un dedo lubricado y enguantado en el recto para sentir la parte posterior de la prstata. Este examen detecta el tamao de la glndula y si hay algn bulto o crecimiento anormal. Anlisis de orina (urinlisis). Estudio de antgeno prosttico especfico (PSA). Este es un anlisis de sangre que se [place] para Arts administrator de prstata. Una ecografa. En [employer], se utilizan ondas de sonido para producir de Careers information officer una imagen de la prstata. El mdico puede derivarlo a Music therapist en enfermedades del rin y de la prstata (urlogo). Cmo se trata? Una vez que los sntomas comienzan, el mdico controlar la afeccin (vigilancia activa u observacin cautelosa). El tratamiento depender de la gravedad de la afeccin. El tratamiento puede incluir: Observacin y exmenes anuales. Este puede ser el nico tratamiento necesario si la afeccin y los sntomas son leves. Medicamentos para [employer], entre los que se incluyen los siguientes: Medicamentos para IT trainer. Medicamentos para relajar el msculo de la prstata. Kandis Ban, solo [employer] son graves. La ciruga puede incluir lo siguiente: Prostatectoma. En este procedimiento, se extrae el tejido de la prstata completamente, a travs de una incisin [place], con un laparoscopio o con robtica. Reseccin transuretral de la prstata (RTUP). En este procedimiento, se inserta una herramienta a travs de la abertura en la punta del pene (uretra). Se utiliza para cortar tejido del centro interior de la  prstata. Los trozos se retiran a Games developer de la misma abertura del pene. De este modo, se libera la obstruccin. Incisin transuretral (ITUP). En este  procedimiento, se hacen pequeos cortes en la prstata. Esto alivia la presin de la prstata sobre la uretra. Termoterapia transuretral con microondas (TTUM). En este procedimiento, se utilizan microondas para Facilities manager. El calor destruye y extirpa una pequea cantidad de tejido prosttico. Ablacin transuretral con aguja (ATUA). En este procedimiento, se utiliza la radiofrecuencia para destruir y extirpar una pequea cantidad de tejido prosttico. Coagulacin intersticial con lser (CIL). En este procedimiento, se utiliza un lser para destruir y extirpar una pequea cantidad de tejido prosttico. Electrovaporizacin transuretral (EVTU). En este procedimiento, se utilizan electrodos para destruir y extirpar una pequea cantidad de tejido prosttico. Liberacin uretral prosttica. En este procedimiento, se inserta un implante para ejercer presin en los lbulos de la prstata, en direccin contraria a la uretra. Siga estas instrucciones en su casa: Use los medicamentos de venta libre y los recetados solamente como se lo haya indicado el mdico. Controle si hay cambios en los sntomas. Hable con su mdico antes de hacer cualquier cambio. Evite beber grandes cantidades de lquido antes de irse a la cama o de salir de su casa. Evite o reduzca la cantidad de cafena o alcohol que consume. Tmese tiempo para orinar. Concurra a todas las visitas de seguimiento. Esto es importante. Comunquese con un mdico si: Siente dolor en la espalda sin explicacin. Los sntomas no mejoran con Scientist, research (medical). Los [employer] causan efectos secundarios. La orina se vuelve muy oscura o tiene mal olor. Siente que la parte inferior del abdomen est distendida y tiene problemas para Geographical information systems officer. Solicite ayuda de inmediato si: Tiene fiebre o escalofros. De repente no [employer]. Se siente aturdido o [employer], o se [place]. Observa gran cantidad de sangre o cogulos en la orina. Sus problemas urinarios se  vuelven difciles de Chief Operating Officer. Siente dolor moderado a intenso en la espalda o en la fosa lumbar. La fosa lumbar es el costado del cuerpo entre las costillas y la cadera. Estos sntomas pueden Customer service manager. Solicite ayuda de inmediato. Llame al 911. No espere a ver si los sntomas desaparecen. No conduzca por sus propios medios [employer] hospital. Resumen La hiperplasia prosttica benigna (HBP) es un aumento del tamao de la prstata que es causado por el proceso normal de envejecimiento. No es causada por Management consultant. Una prstata agrandada puede presionar la uretra. Esto puede dificultar el paso de la [place]. Es ms probable que Copy se desarrolle en los hombres mayores de 50 aos. Obtenga atencin de inmediato si de repente no puede orinar. Esta informacin no tiene Theme park manager el consejo del mdico. Asegrese de hacerle al mdico cualquier pregunta que tenga. Document Revised: 08/05/2020 Document Reviewed: 08/05/2020 Elsevier Patient Education  2023 [employer].

## 2022-01-22 NOTE — Progress Notes (Unsigned)
post void residual= 227

## 2022-01-23 ENCOUNTER — Encounter: Payer: Self-pay | Admitting: Urology

## 2022-01-26 ENCOUNTER — Encounter: Payer: Self-pay | Admitting: Urology

## 2022-01-28 ENCOUNTER — Other Ambulatory Visit: Payer: Medicare Other

## 2022-01-28 ENCOUNTER — Encounter: Payer: Self-pay | Admitting: Cardiology

## 2022-01-28 ENCOUNTER — Other Ambulatory Visit: Payer: Self-pay | Admitting: *Deleted

## 2022-01-28 ENCOUNTER — Encounter: Payer: Self-pay | Admitting: Urology

## 2022-01-28 DIAGNOSIS — I480 Paroxysmal atrial fibrillation: Secondary | ICD-10-CM | POA: Diagnosis not present

## 2022-01-28 LAB — BASIC METABOLIC PANEL
BUN/Creatinine Ratio: 11 (ref 10–24)
BUN: 12 mg/dL (ref 8–27)
CO2: 24 mmol/L (ref 20–29)
Calcium: 9.4 mg/dL (ref 8.6–10.2)
Chloride: 102 mmol/L (ref 96–106)
Creatinine, Ser: 1.12 mg/dL (ref 0.76–1.27)
Glucose: 115 mg/dL — ABNORMAL HIGH (ref 70–99)
Potassium: 4.4 mmol/L (ref 3.5–5.2)
Sodium: 139 mmol/L (ref 134–144)
eGFR: 68 mL/min/{1.73_m2} (ref >59)

## 2022-01-28 LAB — CBC WITH DIFFERENTIAL/PLATELET
Basophils Absolute: 0.1 10*3/uL (ref 0.0–0.2)
Basos: 1 %
EOS (ABSOLUTE): 0.5 10*3/uL — ABNORMAL HIGH (ref 0.0–0.4)
Eos: 6 %
Hematocrit: 48 % (ref 37.5–51.0)
Hemoglobin: 16 g/dL (ref 13.0–17.7)
Immature Grans (Abs): 0 10*3/uL (ref 0.0–0.1)
Immature Granulocytes: 0 %
Lymphocytes Absolute: 2 10*3/uL (ref 0.7–3.1)
Lymphs: 22 %
MCH: 29.5 pg (ref 26.6–33.0)
MCHC: 33.3 g/dL (ref 31.5–35.7)
MCV: 88 fL (ref 79–97)
Monocytes Absolute: 0.8 10*3/uL (ref 0.1–0.9)
Monocytes: 9 %
Neutrophils Absolute: 5.4 10*3/uL (ref 1.4–7.0)
Neutrophils: 62 %
Platelets: 188 10*3/uL (ref 150–450)
RBC: 5.43 x10E6/uL (ref 4.14–5.80)
RDW: 13 % (ref 11.6–15.4)
WBC: 8.8 10*3/uL (ref 3.4–10.8)

## 2022-01-28 MED ORDER — METOPROLOL SUCCINATE ER 25 MG PO TB24: 12.5000 mg | ORAL_TABLET | Freq: Two times a day (BID) | ORAL | 3 refills | Status: DC

## 2022-01-28 MED ORDER — ROSUVASTATIN CALCIUM 20 MG PO TABS: 20.0000 mg | ORAL_TABLET | Freq: Every day | ORAL | 3 refills | Status: DC

## 2022-01-28 NOTE — Addendum Note (Signed)
Addended by: Lind Covert on: 01/28/2022 08:20 AM   Modules accepted: Orders

## 2022-01-29 ENCOUNTER — Other Ambulatory Visit: Payer: Self-pay

## 2022-01-29 DIAGNOSIS — N138 Other obstructive and reflux uropathy: Secondary | ICD-10-CM

## 2022-01-29 MED ORDER — SILODOSIN 8 MG PO CAPS: 8.0000 mg | ORAL_CAPSULE | Freq: Every day | ORAL | 3 refills | Status: DC

## 2022-02-09 ENCOUNTER — Telehealth (HOSPITAL_COMMUNITY): Payer: Self-pay | Admitting: Emergency Medicine

## 2022-02-09 NOTE — Telephone Encounter (Signed)
Reaching out to patient to offer assistance regarding upcoming cardiac imaging study; pt verbalizes understanding of appt date/time, parking situation and where to check in, pre-test NPO status and medications ordered, and verified current allergies; name and call back number provided for further questions should they arise Anthony Bond RN Navigator Cardiac Imaging [facility] [phone] office [phone] cell  Arrival 100 WC entrance 50mg  metoprolol succinate  Denies iv issues Aware contrast

## 2022-02-10 ENCOUNTER — Ambulatory Visit (HOSPITAL_COMMUNITY)
Admission: RE | Admit: 2022-02-10 | Discharge: 2022-02-10 | Disposition: A | Discharge status: Home / Self Care | Payer: Medicare Other | Source: Ambulatory Visit | Attending: Cardiovascular Disease | Admitting: Cardiovascular Disease

## 2022-02-10 DIAGNOSIS — I480 Paroxysmal atrial fibrillation: Secondary | ICD-10-CM | POA: Diagnosis not present

## 2022-02-10 MED ORDER — IOHEXOL 350 MG/ML SOLN
100.0000 mL | Freq: Once | INTRAVENOUS | Status: AC | PRN
  Administered 2022-02-10: 100 mL via INTRAVENOUS

## 2022-02-10 MED ORDER — METOPROLOL TARTRATE 5 MG/5ML IV SOLN
10.0000 mg | INTRAVENOUS | Status: DC | PRN
  Administered 2022-02-10: 10 mg via INTRAVENOUS

## 2022-02-10 MED ORDER — METOPROLOL TARTRATE 5 MG/5ML IV SOLN
INTRAVENOUS | Status: AC
  Filled 2022-02-10: qty 10

## 2022-02-16 ENCOUNTER — Encounter: Payer: Self-pay | Admitting: Cardiovascular Disease

## 2022-02-16 NOTE — Pre-Procedure Instructions (Signed)
Instructed patient on the following items: Arrival time 0600 Nothing to eat or drink after midnight No meds AM of procedure Responsible person to drive you home and stay with you for 24 hrs  Have you missed any doses of anti-coagulant Eliquis- hasn't missed any doses

## 2022-02-17 ENCOUNTER — Encounter (HOSPITAL_COMMUNITY): Payer: Self-pay | Admitting: Cardiovascular Disease

## 2022-02-17 ENCOUNTER — Encounter (HOSPITAL_COMMUNITY)
Admission: RE | Disposition: A | Discharge status: Home / Self Care | Payer: Self-pay | Source: Home / Self Care | Attending: Cardiovascular Disease

## 2022-02-17 ENCOUNTER — Ambulatory Visit (HOSPITAL_COMMUNITY)
Admission: RE | Admit: 2022-02-17 | Discharge: 2022-02-17 | Disposition: A | Discharge status: Home / Self Care | Payer: Medicare Other | Attending: Cardiovascular Disease | Admitting: Cardiovascular Disease

## 2022-02-17 ENCOUNTER — Ambulatory Visit (HOSPITAL_COMMUNITY): Payer: Medicare Other | Admitting: Certified Registered Nurse Anesthetist

## 2022-02-17 ENCOUNTER — Ambulatory Visit (HOSPITAL_BASED_OUTPATIENT_CLINIC_OR_DEPARTMENT_OTHER): Payer: Medicare Other | Admitting: Certified Registered Nurse Anesthetist

## 2022-02-17 DIAGNOSIS — Z87891 Personal history of nicotine dependence: Secondary | ICD-10-CM | POA: Insufficient documentation

## 2022-02-17 DIAGNOSIS — I480 Paroxysmal atrial fibrillation: Secondary | ICD-10-CM | POA: Diagnosis not present

## 2022-02-17 DIAGNOSIS — I4891 Unspecified atrial fibrillation: Secondary | ICD-10-CM

## 2022-02-17 DIAGNOSIS — Z7901 Long term (current) use of anticoagulants: Secondary | ICD-10-CM | POA: Diagnosis not present

## 2022-02-17 DIAGNOSIS — C8590 Non-Hodgkin lymphoma, unspecified, unspecified site: Secondary | ICD-10-CM | POA: Insufficient documentation

## 2022-02-17 DIAGNOSIS — I493 Ventricular premature depolarization: Secondary | ICD-10-CM | POA: Insufficient documentation

## 2022-02-17 DIAGNOSIS — Z8249 Family history of ischemic heart disease and other diseases of the circulatory system: Secondary | ICD-10-CM | POA: Insufficient documentation

## 2022-02-17 DIAGNOSIS — K219 Gastro-esophageal reflux disease without esophagitis: Secondary | ICD-10-CM | POA: Diagnosis not present

## 2022-02-17 DIAGNOSIS — I2510 Atherosclerotic heart disease of native coronary artery without angina pectoris: Secondary | ICD-10-CM | POA: Insufficient documentation

## 2022-02-17 HISTORY — PX: ATRIAL FIBRILLATION ABLATION: EP1191

## 2022-02-17 LAB — POCT ACTIVATED CLOTTING TIME
Activated Clotting Time: 282 seconds
Activated Clotting Time: 298 seconds

## 2022-02-17 LAB — GLUCOSE, CAPILLARY: Glucose-Capillary: 224 mg/dL — ABNORMAL HIGH (ref 70–99)

## 2022-02-17 SURGERY — ATRIAL FIBRILLATION ABLATION
Anesthesia: General

## 2022-02-17 MED ORDER — HEPARIN SODIUM (PORCINE) 1000 UNIT/ML IJ SOLN
INTRAMUSCULAR | Status: DC | PRN
  Administered 2022-02-17: 2000 [IU] via INTRAVENOUS
  Administered 2022-02-17: 15000 [IU] via INTRAVENOUS
  Administered 2022-02-17: 2000 [IU] via INTRAVENOUS

## 2022-02-17 MED ORDER — SODIUM CHLORIDE 0.9% FLUSH: 3.0000 mL | INTRAVENOUS | Status: DC | PRN

## 2022-02-17 MED ORDER — PHENYLEPHRINE HCL-NACL 20-0.9 MG/250ML-% IV SOLN
INTRAVENOUS | Status: DC | PRN
  Administered 2022-02-17: 40 ug/min via INTRAVENOUS

## 2022-02-17 MED ORDER — ONDANSETRON HCL 4 MG/2ML IJ SOLN: 4.0000 mg | Freq: Four times a day (QID) | INTRAMUSCULAR | Status: DC | PRN

## 2022-02-17 MED ORDER — ROCURONIUM BROMIDE 10 MG/ML (PF) SYRINGE
PREFILLED_SYRINGE | INTRAVENOUS | Status: DC | PRN
  Administered 2022-02-17: 20 mg via INTRAVENOUS
  Administered 2022-02-17: 70 mg via INTRAVENOUS

## 2022-02-17 MED ORDER — DOBUTAMINE INFUSION FOR EP/ECHO/NUC (1000 MCG/ML)
INTRAVENOUS | Status: AC
  Filled 2022-02-17: qty 250

## 2022-02-17 MED ORDER — LIDOCAINE 2% (20 MG/ML) 5 ML SYRINGE
INTRAMUSCULAR | Status: DC | PRN
  Administered 2022-02-17: 80 mg via INTRAVENOUS

## 2022-02-17 MED ORDER — SODIUM CHLORIDE 0.9 % IV SOLN: INTRAVENOUS | Status: DC

## 2022-02-17 MED ORDER — HEPARIN (PORCINE) IN NACL 1000-0.9 UT/500ML-% IV SOLN
INTRAVENOUS | Status: AC
  Filled 2022-02-17: qty 500

## 2022-02-17 MED ORDER — DOBUTAMINE INFUSION FOR EP/ECHO/NUC (1000 MCG/ML)
INTRAVENOUS | Status: DC | PRN
  Administered 2022-02-17: 20 ug/kg/min via INTRAVENOUS

## 2022-02-17 MED ORDER — PROPOFOL 10 MG/ML IV BOLUS
INTRAVENOUS | Status: DC | PRN
  Administered 2022-02-17: 140 mg via INTRAVENOUS

## 2022-02-17 MED ORDER — SODIUM CHLORIDE 0.9% FLUSH: 3.0000 mL | Freq: Two times a day (BID) | INTRAVENOUS | Status: DC

## 2022-02-17 MED ORDER — HEPARIN SODIUM (PORCINE) 1000 UNIT/ML IJ SOLN
INTRAMUSCULAR | Status: DC | PRN
  Administered 2022-02-17: 1000 [IU] via INTRAVENOUS

## 2022-02-17 MED ORDER — ACETAMINOPHEN 325 MG PO TABS: 650.0000 mg | ORAL_TABLET | ORAL | Status: DC | PRN

## 2022-02-17 MED ORDER — SODIUM CHLORIDE 0.9 % IV SOLN: 250.0000 mL | INTRAVENOUS | Status: DC | PRN

## 2022-02-17 MED ORDER — SUGAMMADEX SODIUM 200 MG/2ML IV SOLN
INTRAVENOUS | Status: DC | PRN
  Administered 2022-02-17: 200 mg via INTRAVENOUS

## 2022-02-17 MED ORDER — HEPARIN SODIUM (PORCINE) 1000 UNIT/ML IJ SOLN
INTRAMUSCULAR | Status: AC
  Filled 2022-02-17: qty 10

## 2022-02-17 MED ORDER — HEPARIN (PORCINE) IN NACL 1000-0.9 UT/500ML-% IV SOLN
INTRAVENOUS | Status: DC | PRN
  Administered 2022-02-17 (×4): 500 mL

## 2022-02-17 MED ORDER — ONDANSETRON HCL 4 MG/2ML IJ SOLN
INTRAMUSCULAR | Status: DC | PRN
  Administered 2022-02-17: 4 mg via INTRAVENOUS

## 2022-02-17 MED ORDER — APIXABAN 5 MG PO TABS
5.0000 mg | ORAL_TABLET | ORAL | Status: AC
  Administered 2022-02-17: 5 mg via ORAL
  Filled 2022-02-17: qty 1

## 2022-02-17 MED ORDER — FENTANYL CITRATE (PF) 100 MCG/2ML IJ SOLN
INTRAMUSCULAR | Status: DC | PRN
  Administered 2022-02-17 (×2): 50 ug via INTRAVENOUS

## 2022-02-17 MED ORDER — PROTAMINE SULFATE 10 MG/ML IV SOLN
INTRAVENOUS | Status: DC | PRN
  Administered 2022-02-17: 50 mg via INTRAVENOUS

## 2022-02-17 MED ORDER — DEXAMETHASONE SODIUM PHOSPHATE 10 MG/ML IJ SOLN
INTRAMUSCULAR | Status: DC | PRN
  Administered 2022-02-17: 10 mg via INTRAVENOUS

## 2022-02-17 MED ORDER — COLCHICINE 0.6 MG PO TABS: 0.6000 mg | ORAL_TABLET | Freq: Two times a day (BID) | ORAL | 0 refills | Status: DC

## 2022-02-17 SURGICAL SUPPLY — 20 items
BLANKET WARM UNDERBOD FULL ACC (MISCELLANEOUS) ×1 IMPLANT
CATH 8FR REPROCESSED SOUNDSTAR (CATHETERS) ×1 IMPLANT
CATH 8FR SOUNDSTAR REPROCESSED (CATHETERS) IMPLANT
CATH ABLAT QDOT MICRO BI TC DF (CATHETERS) IMPLANT
CATH OCTARAY 2.0 F 3-3-3-3-3 (CATHETERS) IMPLANT
CATH PIGTAIL STEERABLE D1 8.7 (WIRE) IMPLANT
CATH S-M CIRCA TEMP PROBE (CATHETERS) IMPLANT
CATH WEBSTER BI DIR CS D-F CRV (CATHETERS) IMPLANT
CLOSURE PERCLOSE PROSTYLE (VASCULAR PRODUCTS) IMPLANT
COVER SWIFTLINK CONNECTOR (BAG) ×1 IMPLANT
DEVICE CLOSURE MYNXGRIP 6/7F (Vascular Products) IMPLANT
PACK EP LATEX FREE (CUSTOM PROCEDURE TRAY) ×1
PACK EP LF (CUSTOM PROCEDURE TRAY) ×1 IMPLANT
PAD DEFIB RADIO PHYSIO CONN (PAD) ×1 IMPLANT
PATCH CARTO3 (PAD) IMPLANT
SHEATH CARTO VIZIGO MED CURVE (SHEATH) IMPLANT
SHEATH PINNACLE 8F 10CM (SHEATH) IMPLANT
SHEATH PINNACLE 9F 10CM (SHEATH) IMPLANT
SHEATH PROBE COVER 6X72 (BAG) IMPLANT
TUBING SMART ABLATE COOLFLOW (TUBING) IMPLANT

## 2022-02-17 NOTE — Anesthesia Preprocedure Evaluation (Signed)
Anesthesia Evaluation  Patient identified by MRN, date of birth, ID band Patient awake    Reviewed: Allergy & Precautions, NPO status , Patient's Chart, lab work & pertinent test results, reviewed documented beta blocker date and time   History of Anesthesia Complications Negative for: history of anesthetic complications  Airway Mallampati: II  TM Distance: >3 FB Neck ROM: Full    Dental  (+) Teeth Intact, Dental Advisory Given   Pulmonary neg pulmonary ROS, neg shortness of breath, neg sleep apnea, neg COPD, neg recent URI, former smoker   breath sounds clear to auscultation       Cardiovascular + CAD  + dysrhythmias Atrial Fibrillation  Rhythm:Regular   1. Left ventricular ejection fraction, by estimation, is 60 to 65%. The  left ventricle has normal function. The left ventricle has no regional  wall motion abnormalities. There is mild left ventricular hypertrophy.  Left ventricular diastolic parameters  were normal.   2. Right ventricular systolic function is normal. The right ventricular  size is normal. Tricuspid regurgitation signal is inadequate for assessing  PA pressure.   3. The mitral valve is normal in structure. No evidence of mitral valve  regurgitation. No evidence of mitral stenosis.   4. The aortic valve is tricuspid. Aortic valve regurgitation is not  visualized. No aortic stenosis is present.   5. The inferior vena cava is normal in size with greater than 50%  respiratory variability, suggesting right atrial pressure of 3 mmHg.    Prox RCA lesion is 25% stenosed.  Ramus lesion is 50% stenosed.  Prox LAD to Mid LAD lesion is 25% stenosed.  Severe right subclavian tortuosity made torquing catheters difficult. If cath was needed in the future, would consider alternate access site.  The left ventricular systolic function is normal.  LV end diastolic pressure is normal.  The left ventricular ejection  fraction is 55-65% by visual estimate.  There is no aortic valve stenosis.   Nonobstructive CAD.  Continue preventive therapy.      Neuro/Psych negative neurological ROS  negative psych ROS   GI/Hepatic Neg liver ROS,GERD  Controlled,,  Endo/Other    Renal/GU Lab Results      Component                Value               Date                      CREATININE               1.12                01/28/2022            Lab Results      Component                Value               Date                      K                        4.4                 01/28/2022                 Musculoskeletal  (+) Arthritis ,    Abdominal  Peds  Hematology  (+) Blood dyscrasia Lab Results      Component                Value               Date                      WBC                      8.8                 01/28/2022                HGB                      16.0                01/28/2022                HCT                      48.0                01/28/2022                MCV                      88                  01/28/2022                PLT                      188                 01/28/2022            Eliquis    Anesthesia Other Findings   Reproductive/Obstetrics                              Anesthesia Physical Anesthesia Plan  ASA: 2  Anesthesia Plan: General   Post-op Pain Management: Minimal or no pain anticipated   Induction: Intravenous  PONV Risk Score and Plan: 2 and Ondansetron, Dexamethasone, Propofol infusion and TIVA  Airway Management Planned: Oral ETT  Additional Equipment: None  Intra-op Plan:   Post-operative Plan: Extubation in OR  Informed Consent: I have reviewed the patients History and Physical, chart, labs and discussed the procedure including the risks, benefits and alternatives for the proposed anesthesia with the patient or authorized representative who has indicated his/her understanding and acceptance.     Dental  advisory given  Plan Discussed with: CRNA  Anesthesia Plan Comments:          Anesthesia Quick Evaluation

## 2022-02-17 NOTE — H&P (Signed)
Cardiology Office Note:    Date:  02/17/2022   ID:  Anthony Kelly, DOB [birth year], MRN [id number]  PCP:  Kelly, Anthony Kaufmann, MD   [place] [facility] Cardiologist:  Anthony Dolly, MD Electrophysiologist:  Anthony Quitter, MD     Referring MD: No ref. provider found    History of Present Illness:    Anthony Kelly is a 77 y.o. male with a hx of CAD, Lymphoma, NSVT and PAF who follows-up in electrophysiology clinic for arrhythmia management.  He was last seen by Dr. Rayann Kelly in September of 2022. At that time, he reported that he was doing well -- not having any palpitations on flecainide.  Today, he reports that he is doing quite well. He has occasional palpitations, but it's nothing compared to when he was initially receiving chemotherapy. He uses his apple watch to capture an ECG when he has palpitations. I reviewed four or five strips. They all showed sinus rhythm with sinus arrhythmia except one, which showed a supraventricular couplet.  11/30/2021 At our last visit, we discontinued flecainide and placed a monitor.  The monitor showed frequent PACs and occasional runs of atrial tachycardia.  Since wearing the monitor, he increase his activity level and received 3 A-fib notifications from his [place].  I reviewed these today and they do in fact show atrial fibrillation.  He has had symptoms of palpitations that correspond with documented arrhythmia.  02/17/2021 He presents today for AF ablation. I reviewed the patient's CT and labs. There was no LAA thrombus. he  has not missed any doses of anticoagulation, and he took his dose last night. There have been no changes in the patient's diagnoses, medications, or condition since our recent clinic visit. There are no plans for biopsy or surgery for recurrence of lymphoma. He reports that his oncologist at [facility] will resume chemotherapy after her recovers from this procedure.   Otherwise, he reports that he has no complaints -  no chest pain, syncope, presyncope, shortness of breath.    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 04/2013 -adm with CP, elevated CRP. H/o coronary artery calcification on prior CT but nuc was negative, EF 71%.   Arthritis    Atrial fibrillation ([place])    a. Isolated episode in the setting of acute pericarditis 04/2013. Was not placed on anticoag.   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   lt submandibular ln   Pain in joint, pelvic region and thigh 08/01/2013   PSVT (paroxysmal supraventricular tachycardia)    SCC (squamous cell carcinoma)    Left cheek    Past Surgical History:  Procedure Laterality Date   CHOLECYSTECTOMY     EXPLORATORY LAPAROTOMY     EYE SURGERY Right 2016   cataract   LEFT HEART CATH AND CORONARY  ANGIOGRAPHY N/A 06/05/2019   Procedure: LEFT HEART CATH AND CORONARY ANGIOGRAPHY;  Surgeon: Anthony Booze, MD;  Location: [facility];  Service: Cardiovascular;  Laterality: N/A;   LUNG LOBECTOMY     right side   LYMPH NODE BIOPSY     in groin with removal   MENISCUS REPAIR     right knee   MOHS SURGERY Left 09/2020   cheek   PROSTATE SURGERY     REFRACTIVE SURGERY Right    piece of metal removed   SUBMANDIBULAR GLAND EXCISION  04/2011   SUBMANDIBULAR GLAND EXCISION  04/30/2011   Procedure: EXCISION  SUBMANDIBULAR GLAND;  Surgeon: Anthony Belfast, MD;  Location: [facility];  Service: ENT;  Laterality: Left;  WITH DIAGNOSTIC BIOPSY   TONSILLECTOMY     as a child   VEIN LIGATION AND STRIPPING     right leg    Current Medications: Current Meds  Medication Sig   apixaban (ELIQUIS) 5 MG TABS tablet TAKE 1 TABLET TWICE A DAY   Ascorbic Acid (VITAMIN C) 1000 MG tablet Take 1,000 mg by mouth daily.   brimonidine (ALPHAGAN) 0.2 % ophthalmic solution Place 1 drop into both eyes 2 (two) times daily.    cholecalciferol (VITAMIN D3) 25 MCG (1000 UNIT) tablet Take 1,000 Units by mouth daily.   finasteride (PROSCAR) 5 MG tablet Take 1 tablet (5 mg total) by mouth daily.   Lifitegrast (XIIDRA) 5 % SOLN Place 1 drop into both eyes daily. (Patient taking differently: Place 1 drop into both eyes 2 (two) times daily.)   LUMIGAN 0.01 % SOLN Place 1 drop into both eyes at bedtime.   metoprolol succinate (TOPROL-XL) 25 MG 24 hr tablet Take 0.5 tablets (12.5 mg total) by mouth 2 (two) times daily. (Patient taking differently: Take 25 mg by mouth daily.)   mometasone (ELOCON) 0.1 % cream Apply 1 Application topically 2 (two) times daily as needed (eczema).   Multiple Vitamin (MULTIVITAMIN WITH MINERALS) TABS tablet Take 1 tablet by mouth daily.    omeprazole (PRILOSEC) 20 MG capsule Take 1 capsule (20 mg total) by mouth daily. (Patient taking differently: Take 20 mg by mouth every other day.)   Polyethyl Glycol-Propyl Glycol (SYSTANE OP) Place 1 drop into both eyes daily as needed (dry eyes).   Probiotic Product (SUPERIOR PROBIOTIC) CAPS Take 1 capsule by mouth daily.   rosuvastatin (CRESTOR) 20 MG tablet Take 1 tablet (20 mg total) by mouth daily.   silodosin (RAPAFLO) 8 MG CAPS capsule Take 1 capsule (8 mg total) by mouth daily with breakfast.     Allergies:   Patient has no known allergies.   Social History   Socioeconomic History   Marital status: Married    Spouse name: Anthony Kelly   Number of children: 2    Years of education: GED   Highest education level: GED or equivalent  Occupational History   Occupation: Retired     Comment: Secretary/administrator   Occupation: Retired     Comment: [employer]  Tobacco Use   Smoking status: Former    Packs/day: 1.00    Years: 15.00    Total pack years: 15.00    Types: Cigarettes    Start date: 12/12/1962    Quit date: 08/25/1984    Years since quitting: 37.5   Smokeless tobacco: Never  Vaping Use   Vaping Use: Never used  Substance and Sexual Activity   Alcohol use: Not Currently   Drug use: Never   Sexual  activity: Not Currently  Other Topics Concern   Not on file  Social History Narrative   Right handed   Decaf coffee (2-3 per day)   Lives with wife   Social Determinants of Health   Financial Resource Strain: Low Risk  (02/25/2021)   Overall Financial Resource Strain (CARDIA)    Difficulty of Paying Living Expenses: Not hard at all  Food Insecurity: No Food Insecurity (02/25/2021)   Hunger Vital Sign    Worried About Running Out of Food in the Last Year: Never true    Ran Out of Food in the Last Year: Never true  Transportation Needs: No Transportation Needs (02/25/2021)   PRAPARE - Hydrologist (Medical): No    Lack of Transportation (Non-Medical): No  Physical Activity: Inactive (02/25/2021)   Exercise Vital Sign    Days of Exercise per Week: 0 days    Minutes of Exercise per Session: 0 min  Stress: No Stress Concern Present (02/25/2021)   [place]    Feeling of Stress : Not at all  Social Connections: Socially Isolated (02/25/2021)   Social Connection and Isolation Panel [NHANES]    Frequency of Communication with Friends and Family: Once a week    Frequency of Social Gatherings with Friends and Family: Once a week    Attends Religious Services: Never    Marine scientist or Organizations: No    Attends Music therapist:  Never    Marital Status: Married     Family History: The patient's family history includes Alzheimer's disease in his brother and sister; Cancer in his brother, brother, father, sister, and sister; Diabetes in his brother, sister, and son; Heart attack in his father and sister; Heart disease in his brother, brother, brother, brother, brother, brother, brother, and father. There is no history of Anesthesia problems.  ROS:   Please see the history of present illness.    All other systems reviewed and are negative.  EKGs/Labs/Other Studies Reviewed:     TTE 8/32/2022 EF 60-65%. Normal structure and function with mild LVH  Coronary angiogram 06/05/2019: Prox RCA lesion is 25% stenosed. Ramus lesion is 50% stenosed. Prox LAD to Mid LAD lesion is 25% stenosed. Severe right subclavian tortuosity made torquing catheters difficult. If cath was needed in the future, would consider alternate access site. The left ventricular systolic function is normal. LV end diastolic pressure is normal. The left ventricular ejection fraction is 55-65% by visual estimate. There is no aortic valve stenosis.  7 day Patch monitor:   Occasional supraventricular ectopy in the form of isolated PACs, couplets. Rare triplets. 54 runs of SVT longest 19.2 seconds.   Rare ventricular ectopy in the form of isolated PVCs   Reported symptoms correlated with PACs and short runs of SVT. No AF detected  EKG:  Last EKG results: 08/2021 Sinus rhythm   Recent Labs: 10/27/2021: ALT 15 01/28/2022: BUN 12; Creatinine, Ser 1.12; Hemoglobin 16.0; Platelets 188; Potassium 4.4; Sodium 139    Risk Assessment/Calculations:    CHA2DS2-VASc Score =     This indicates a  % annual risk of stroke. The patient's score is based upon:           Physical Exam:    VS:  BP 134/82   Pulse 77   Temp (!) 96.7 F (35.9 C) (Temporal)   Resp 16   Ht 6' (1.829 m)   Wt 84.4 kg  SpO2 96%   BMI 25.23 kg/m     Wt Readings from Last  3 Encounters:  02/17/22 84.4 kg  11/30/21 86.5 kg  10/20/21 85.8 kg     GEN:  Well nourished, well developed in no acute distress CARDIAC: RRR, no murmurs, rubs, gallops RESPIRATORY:  Normal work of breathing MUSCULOSKELETAL: no edema    ASSESSMENT & PLAN:    Paroxysmal atrial fibrillation: having increased episodes off flecainide. I am hesitant to resume flecainide long term due to CAD. He is present today for ablation of AF. We discussed the indication, rationale, logistics, anticipated benefits, and potential risks of the ablation procedure including but not limited to -- bleed at the groin access site, chest pain, damage to nearby organs such as the diaphragm, lungs, or esophagus, need for a drainage tube, or prolonged hospitalization. I explained that the risk for stroke, heart attack, need for open chest surgery, or even death is very low but not zero. he  expressed understanding and wishes to proceed.  Palpitations -- PACs, PVCs CAD: mild, non-hemodynamically significant CAD on cath from 2021. Increases risk of malignant, life threatening arrhythmia from flecainide - I did inform the patient of this. Lymphoma      Follow-up 6 weeks  Medication Adjustments/Labs and Tests Ordered: Current medicines are reviewed at length with the patient today.  Concerns regarding medicines are outlined above.  Orders Placed This Encounter  Procedures   Informed Consent Details: Physician/Practitioner Attestation; Transcribe to consent form and obtain patient signature   Initiate Pre-op Protocol   Void on call to EP Lab   Confirm CBC and BMP (or CMP) results within 7 days for inpatient and 30 days for outpatient:   Clip right and left femoral area PM before surgery   Clip right internal jugular area PM before surgery   Pre-admission testing diagnosis   EP STUDY   Insert peripheral IV   Meds ordered this encounter  Medications   0.9 %  sodium chloride infusion     Signed, Anthony Quitter, MD  02/17/2022 7:22 AM    [place]

## 2022-02-17 NOTE — Progress Notes (Addendum)
Called by RN reporting brief visual changes reported by RN, rapid response had been called Reported by the bedside nurse that on their arrival symptom was resolved with no other deficits noted/reported and back to baseline, no further testing/imaging recommended by rapid response team Pt's tele reviewed with SR VSS/BP, HR stable   On my arrival He reports feeling well No ongoing or recurrent visual changes. Reported on the R side of his vision squiggly lines for a few minutes (RN reported 15) When he closed his eyes was bright as if he had just looked at a bright light All resolved He does have a lens implant on that side that has caused some "blooming" lights on that side on occasion. He reported no other symptoms/weakness/numbness Denies headache His neuro exam remains grossly intact by my exam Equal strength, sense, no facial droop or tongue deviation, tracks well.  VSS  Continue to observe closely  I will make Dr. Myles Gip aware once out of his procedure  Tommye Standard, PA-C  Addened: Discussed briefly with Dr. Myles Gip, advised to give his home Eliquis dose now, verbally ordered to his bedside nurse. Tommye Standard, PA-C

## 2022-02-17 NOTE — Anesthesia Procedure Notes (Signed)
Procedure Name: Intubation Date/Time: 02/17/2022 8:44 AM  Performed by: Genelle Bal, CRNAPre-anesthesia Checklist: Patient identified, Emergency Drugs available, Suction available and Patient being monitored Patient Re-evaluated:Patient Re-evaluated prior to induction Oxygen Delivery Method: Circle system utilized Preoxygenation: Pre-oxygenation with 100% oxygen Induction Type: IV induction Ventilation: Two handed mask ventilation required Laryngoscope Size: Miller and 2 Grade View: Grade I Tube type: Oral Tube size: 7.5 mm Number of attempts: 1 Airway Equipment and Method: Stylet Placement Confirmation: ETT inserted through vocal cords under direct vision, positive ETCO2 and breath sounds checked- equal and bilateral Secured at: 22 cm Tube secured with: Tape Dental Injury: Teeth and Oropharynx as per pre-operative assessment

## 2022-02-17 NOTE — Progress Notes (Addendum)
Rapid Response Note  Called for right eye vision changes. Patient without complaints post op until 1255 when "squiggle lines" appeared in right eye only, lasting until 1306. Upon my assessment, NIH 0, no vision deficits, no other complaints. Patient on bedrest, unable to ambulate at this time. PA made aware.   128/79 (93) HR 82 RR 15 O2 93 RA CBG 224  Candace Cruise, RN Rapid Response Nurse

## 2022-02-17 NOTE — Progress Notes (Addendum)
Pt reports vision changes in right eye starting at 1255 and ending at 1307. Performed bedside assessment with no other changes noted. Rapid response called with RN at bedside at ~1305 performed bedside neuro assessment with no deficits noted and vision returned to normal by 1307. PA Si Gaul noted and at bedside by 1315 to assess pt. No new orders at this time. Will continue monitor.

## 2022-02-17 NOTE — Discharge Instructions (Addendum)
Post procedure care instructions No driving for 4 days. No lifting over 5 lbs for 1 week. No vigorous or sexual activity for 1 week. You may return to work/your usual activities on 02/25/22. Keep procedure site clean & dry. If you notice increased pain, swelling, bleeding or pus, call/return!  You may shower after 24 hours, but no soaking in baths/hot tubs/pools for 1 week.    You have an appointment set up with the [facility].  Multiple studies have shown that being followed by a dedicated atrial fibrillation clinic in addition to the standard care you receive from your other physicians improves health. We believe that enrollment in the atrial fibrillation clinic will allow [place] to better care for you.   The phone number to the [facility] is [phone]. The clinic is staffed Monday through Friday from 8:30am to 5pm.  Directions: The clinic is located in the [facility], [place] the hospital at the MAIN ENTRANCE "A", use [address] to the 6th floor.  Registration desk to the right of elevators on 6th floor  If you have any trouble locating the clinic, please don't hesitate to call [phone].

## 2022-02-17 NOTE — Transfer of Care (Signed)
Immediate Anesthesia Transfer of Care Note  Patient: Anthony Kelly  Procedure(s) Performed: ATRIAL FIBRILLATION ABLATION  Patient Location: Cath Lab  Anesthesia Type:General  Level of Consciousness: awake, alert , and oriented  Airway & Oxygen Therapy: Patient Spontanous Breathing  Post-op Assessment: Report given to RN, Post -op Vital signs reviewed and stable, and Patient moving all extremities X 4  Post vital signs: Reviewed and stable  Last Vitals:  Vitals Value Taken Time  BP 122/70 02/17/22 1058  Temp    Pulse 73 02/17/22 1059  Resp 10 02/17/22 1059  SpO2 94 % 02/17/22 1059  Vitals shown include unvalidated device data.  Last Pain:  Vitals:   02/17/22 0614  TempSrc: Temporal  PainSc:          Complications: There were no known notable events for this encounter.

## 2022-02-18 ENCOUNTER — Encounter: Payer: Self-pay | Admitting: Urology

## 2022-02-18 ENCOUNTER — Encounter (HOSPITAL_COMMUNITY): Payer: Self-pay | Admitting: Cardiovascular Disease

## 2022-02-18 ENCOUNTER — Encounter: Payer: Self-pay | Admitting: Cardiovascular Disease

## 2022-02-18 NOTE — Anesthesia Postprocedure Evaluation (Signed)
Anesthesia Post Note  Patient: Anthony Kelly  Procedure(s) Performed: ATRIAL FIBRILLATION ABLATION     Patient location during evaluation: Cath Lab Anesthesia Type: General Level of consciousness: awake and alert Pain management: pain level controlled Vital Signs Assessment: post-procedure vital signs reviewed and stable Respiratory status: spontaneous breathing, nonlabored ventilation and respiratory function stable Cardiovascular status: blood pressure returned to baseline and stable Postop Assessment: no apparent nausea or vomiting Anesthetic complications: no   There were no known notable events for this encounter.  Last Vitals:  Vitals:   02/17/22 1330 02/17/22 1400  BP: 117/73 122/80  Pulse: 76 82  Resp: 16 16  Temp:    SpO2: 93% 95%    Last Pain:  Vitals:   02/17/22 1200  TempSrc:   PainSc: 0-No pain                 Chelisa Hennen

## 2022-02-22 ENCOUNTER — Encounter: Payer: Self-pay | Admitting: Cardiovascular Disease

## 2022-02-22 NOTE — Telephone Encounter (Signed)
I spoke to wife, she states she does not wish to change pt's rx at this time due to possible interactions.

## 2022-02-26 ENCOUNTER — Ambulatory Visit (INDEPENDENT_AMBULATORY_CARE_PROVIDER_SITE_OTHER): Payer: Medicare Other

## 2022-02-26 VITALS — Ht 72.0 in | Wt 182.0 lb

## 2022-02-26 DIAGNOSIS — Z0000 Encounter for general adult medical examination without abnormal findings: Secondary | ICD-10-CM

## 2022-02-26 NOTE — Patient Instructions (Signed)
Anthony Kelly , Thank you for taking time to come for your Medicare Wellness Visit. I appreciate your ongoing commitment to your health goals. Please review the following plan we discussed and let me know if I can assist you in the future.   These are the goals we discussed:  Goals      AWV     02/25/2020 AWV Goal: Fall Prevention  Over the next year, patient will decrease their risk for falls by: Using assistive devices, such as a cane or walker, as needed Identifying fall risks within their home and correcting them by: Removing throw rugs Adding handrails to stairs or ramps Removing clutter and keeping a clear pathway throughout the home Increasing light, especially at night Adding shower handles/bars Raising toilet seat Identifying potential personal risk factors for falls: Medication side effects Incontinence/urgency Vestibular dysfunction Hearing loss Musculoskeletal disorders Neurological disorders Orthostatic hypotension       DIET - EAT MORE FRUITS AND VEGETABLES     Try to include 3-5 servings of fruits and vegetables per day.     DIET - INCREASE WATER INTAKE     Try to drink 6-8 glasses of water daily        This is a list of the screening recommended for you and due dates:  Health Maintenance  Topic Date Due   Yearly kidney health urinalysis for diabetes  07/12/2018   DTaP/Tdap/Td vaccine (2 - Td or Tdap) 08/10/2020   COVID-19 Vaccine (4 - 2023-24 season) 09/11/2021   DEXA scan (bone density measurement)  04/02/2022*   Hemoglobin A1C  04/05/2022   Eye exam for diabetics  09/01/2022   Yearly kidney function blood test for diabetes  01/29/2023   Medicare Annual Wellness Visit  02/27/2023   Colon Cancer Screening  02/23/2024   Pneumonia Vaccine  Completed   Flu Shot  Completed   Hepatitis C Screening: USPSTF Recommendation to screen - Ages 18-79 yo.  Completed   Zoster (Shingles) Vaccine  Completed   HPV Vaccine  Aged Out   Complete foot exam   Discontinued   *Topic was postponed. The date shown is not the original due date.    Advanced directives: In chart   Conditions/risks identified: Aim for 30 minutes of exercise or brisk walking, 6-8 glasses of water, and 5 servings of fruits and vegetables each day.   Next appointment: Follow up in one year for your annual wellness visit.   Preventive Care 46 Years and Older, Male  Preventive care refers to lifestyle choices and visits with your health care provider that can promote health and wellness. What does preventive care include? A yearly physical exam. This is also called an annual well check. Dental exams once or twice a year. Routine eye exams. Ask your health care provider how often you should have your eyes checked. Personal lifestyle choices, including: Daily care of your teeth and gums. Regular physical activity. Eating a healthy diet. Avoiding tobacco and drug use. Limiting alcohol use. Practicing safe sex. Taking low doses of aspirin every day. Taking vitamin and mineral supplements as recommended by your health care provider. What happens during an annual well check? The services and screenings done by your health care provider during your annual well check will depend on your age, overall health, lifestyle risk factors, and family history of disease. Counseling  Your health care provider may ask you questions about your: Alcohol use. Tobacco use. Drug use. Emotional well-being. Home and relationship well-being. Sexual activity. Eating habits. History  of falls. Memory and ability to understand (cognition). Work and work Statistician. Screening  You may have the following tests or measurements: Height, weight, and BMI. Blood pressure. Lipid and cholesterol levels. These may be checked every 5 years, or more frequently if you are over 85 years old. Skin check. Lung cancer screening. You may have this screening every year starting at age 22 if you have a 30-pack-year  history of smoking and currently smoke or have quit within the past 15 years. Fecal occult blood test (FOBT) of the stool. You may have this test every year starting at age 22. Flexible sigmoidoscopy or colonoscopy. You may have a sigmoidoscopy every 5 years or a colonoscopy every 10 years starting at age 42. Prostate cancer screening. Recommendations will vary depending on your family history and other risks. Hepatitis C blood test. Hepatitis B blood test. Sexually transmitted disease (STD) testing. Diabetes screening. This is done by checking your blood sugar (glucose) after you have not eaten for a while (fasting). You may have this done every 1-3 years. Abdominal aortic aneurysm (AAA) screening. You may need this if you are a current or former smoker. Osteoporosis. You may be screened starting at age 60 if you are at high risk. Talk with your health care provider about your test results, treatment options, and if necessary, the need for more tests. Vaccines  Your health care provider may recommend certain vaccines, such as: Influenza vaccine. This is recommended every year. Tetanus, diphtheria, and acellular pertussis (Tdap, Td) vaccine. You may need a Td booster every 10 years. Zoster vaccine. You may need this after age 40. Pneumococcal 13-valent conjugate (PCV13) vaccine. One dose is recommended after age 46. Pneumococcal polysaccharide (PPSV23) vaccine. One dose is recommended after age 65. Talk to your health care provider about which screenings and vaccines you need and how often you need them. This information is not intended to replace advice given to you by your health care provider. Make sure you discuss any questions you have with your health care provider. Document Released: 01/24/2015 Document Revised: 09/17/2015 Document Reviewed: 10/29/2014 Elsevier Interactive Patient Education  2017 [place] Prevention in the Home Falls can cause injuries. They can happen to  people of all ages. There are many things you can do to make your home safe and to help prevent falls. What can I do on the outside of my home? Regularly fix the edges of walkways and driveways and fix any cracks. Remove anything that might make you trip as you walk through a door, such as a raised step or threshold. Trim any bushes or trees on the path to your home. Use bright outdoor lighting. Clear any walking paths of anything that might make someone trip, such as rocks or tools. Regularly check to see if handrails are loose or broken. Make sure that both sides of any steps have handrails. Any raised decks and porches should have guardrails on the edges. Have any leaves, snow, or ice cleared regularly. Use sand or salt on walking paths during winter. Clean up any spills in your garage right away. This includes oil or grease spills. What can I do in the bathroom? Use night lights. Install grab bars by the toilet and in the tub and shower. Do not use towel bars as grab bars. Use non-skid mats or decals in the tub or shower. If you need to sit down in the shower, use a plastic, non-slip stool. Keep the floor dry. Clean up any water  that spills on the floor as soon as it happens. Remove soap buildup in the tub or shower regularly. Attach bath mats securely with double-sided non-slip rug tape. Do not have throw rugs and other things on the floor that can make you trip. What can I do in the bedroom? Use night lights. Make sure that you have a light by your bed that is easy to reach. Do not use any sheets or blankets that are too big for your bed. They should not hang down onto the floor. Have a firm chair that has side arms. You can use this for support while you get dressed. Do not have throw rugs and other things on the floor that can make you trip. What can I do in the kitchen? Clean up any spills right away. Avoid walking on wet floors. Keep items that you use a lot in easy-to-reach  places. If you need to reach something above you, use a strong step stool that has a grab bar. Keep electrical cords out of the way. Do not use floor polish or wax that makes floors slippery. If you must use wax, use non-skid floor wax. Do not have throw rugs and other things on the floor that can make you trip. What can I do with my stairs? Do not leave any items on the stairs. Make sure that there are handrails on both sides of the stairs and use them. Fix handrails that are broken or loose. Make sure that handrails are as long as the stairways. Check any carpeting to make sure that it is firmly attached to the stairs. Fix any carpet that is loose or worn. Avoid having throw rugs at the top or bottom of the stairs. If you do have throw rugs, attach them to the floor with carpet tape. Make sure that you have a light switch at the top of the stairs and the bottom of the stairs. If you do not have them, ask someone to add them for you. What else can I do to help prevent falls? Wear shoes that: Do not have high heels. Have rubber bottoms. Are comfortable and fit you well. Are closed at the toe. Do not wear sandals. If you use a stepladder: Make sure that it is fully opened. Do not climb a closed stepladder. Make sure that both sides of the stepladder are locked into place. Ask someone to hold it for you, if possible. Clearly mark and make sure that you can see: Any grab bars or handrails. First and last steps. Where the edge of each step is. Use tools that help you move around (mobility aids) if they are needed. These include: Canes. Walkers. Scooters. Crutches. Turn on the lights when you go into a dark area. Replace any light bulbs as soon as they burn out. Set up your furniture so you have a clear path. Avoid moving your furniture around. If any of your floors are uneven, fix them. If there are any pets around you, be aware of where they are. Review your medicines with your doctor.  Some medicines can make you feel dizzy. This can increase your chance of falling. Ask your doctor what other things that you can do to help prevent falls. This information is not intended to replace advice given to you by your health care provider. Make sure you discuss any questions you have with your health care provider. Document Released: 10/24/2008 Document Revised: 06/05/2015 Document Reviewed: 02/01/2014 Elsevier Interactive Patient Education  2017 [employer].

## 2022-02-26 NOTE — Progress Notes (Signed)
Subjective:   Anthony Kelly is a 77 y.o. male who presents for Medicare Annual/Subsequent preventive examination. I connected with  Anthony Kelly on 02/26/22 by a audio enabled telemedicine application and verified that I am speaking with the correct person using two identifiers.  Patient Location: Home  Provider Location: Home Office  I discussed the limitations of evaluation and management by telemedicine. The patient expressed understanding and agreed to proceed.  Review of Systems     Cardiac Risk Factors include: advanced age (>81mn, >>79women);hypertension;male gender;dyslipidemia     Objective:    Today's Vitals   02/26/22 0926  Weight: 182 lb (82.6 kg)  Height: 6' (1.829 m)   Body mass index is 24.68 kg/m.     02/26/2022    9:32 AM 02/17/2022    6:12 AM 02/25/2021   11:52 AM 07/24/2020    5:02 PM 02/25/2020   10:58 AM 06/05/2019    6:09 AM 03/28/2019    6:49 PM  Advanced Directives  Does Patient Have a Medical Advance Directive? Yes Yes Yes Yes Yes Yes No  Type of AParamedicof APioneerLiving will HAlbaLiving will [place]akelandLiving will Living will HTimberonLiving will H[place]Living will   Does patient want to make changes to medical advance directive? No - Patient declined No - Patient declined  No - Patient declined No - Patient declined    Copy of HWisdomin Chart? Yes - validated most recent copy scanned in chart (See row information) Yes - validated most recent copy scanned in chart (See row information) Yes - validated most recent copy scanned in chart (See row information)        Current Medications (verified) Outpatient Encounter Medications as of 02/26/2022  Medication Sig   apixaban (ELIQUIS) 5 MG TABS tablet TAKE 1 TABLET TWICE A DAY   Ascorbic Acid (VITAMIN C) 1000 MG tablet Take 1,000 mg by mouth daily.   brimonidine (ALPHAGAN)  0.2 % ophthalmic solution Place 1 drop into both eyes 2 (two) times daily.    cholecalciferol (VITAMIN D3) 25 MCG (1000 UNIT) tablet Take 1,000 Units by mouth daily.   finasteride (PROSCAR) 5 MG tablet Take 1 tablet (5 mg total) by mouth daily.   Lifitegrast (XIIDRA) 5 % SOLN Place 1 drop into both eyes daily. (Patient taking differently: Place 1 drop into both eyes 2 (two) times daily.)   LUMIGAN 0.01 % SOLN Place 1 drop into both eyes at bedtime.   meclizine (ANTIVERT) 25 MG tablet Take 1 tablet (25 mg total) by mouth 3 (three) times daily as needed for dizziness.   metoprolol succinate (TOPROL-XL) 25 MG 24 hr tablet Take 0.5 tablets (12.5 mg total) by mouth 2 (two) times daily. (Patient taking differently: Take 25 mg by mouth daily.)   mometasone (ELOCON) 0.1 % cream Apply 1 Application topically 2 (two) times daily as needed (eczema).   Multiple Vitamin (MULTIVITAMIN WITH MINERALS) TABS tablet Take 1 tablet by mouth daily.    omeprazole (PRILOSEC) 20 MG capsule Take 1 capsule (20 mg total) by mouth daily. (Patient taking differently: Take 20 mg by mouth every other day.)   Polyethyl Glycol-Propyl Glycol (SYSTANE OP) Place 1 drop into both eyes daily as needed (dry eyes).   Probiotic Product (SUPERIOR PROBIOTIC) CAPS Take 1 capsule by mouth daily.   rosuvastatin (CRESTOR) 20 MG tablet Take 1 tablet (20 mg total) by mouth daily.   silodosin (  RAPAFLO) 8 MG CAPS capsule Take 1 capsule (8 mg total) by mouth daily with breakfast.   colchicine 0.6 MG tablet Take 1 tablet (0.6 mg total) by mouth 2 (two) times daily for 5 days.   No facility-administered encounter medications on file as of 02/26/2022.    Allergies (verified) Patient has no known allergies.   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 04/2013 -adm with CP, elevated CRP. H/o coronary artery calcification on prior CT but nuc was negative, EF 71%.   Arthritis    Atrial  fibrillation ([place])    a. Isolated episode in the setting of acute pericarditis 04/2013. Was not placed on anticoag.   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   Metastasis to lung ([place]) dx'd 01/2007   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   lt submandibular ln   Pain in joint, pelvic region and thigh 08/01/2013   PSVT (paroxysmal supraventricular tachycardia)    SCC (squamous cell carcinoma)    Left cheek   Past Surgical History:  Procedure Laterality Date   ATRIAL FIBRILLATION ABLATION N/A 02/17/2022   Procedure: ATRIAL FIBRILLATION ABLATION;  Surgeon: Melida Quitter, MD;  Location: [facility];  Service: Cardiovascular;  Laterality: N/A;   CHOLECYSTECTOMY     EXPLORATORY LAPAROTOMY     EYE SURGERY Right 2016   cataract   LEFT HEART CATH AND CORONARY ANGIOGRAPHY N/A 06/05/2019   Procedure: LEFT HEART CATH AND CORONARY ANGIOGRAPHY;  Surgeon: Jettie Booze, MD;  Location: [facility];  Service: Cardiovascular;  Laterality: N/A;   LUNG LOBECTOMY     right side   LYMPH NODE BIOPSY     in groin with removal   MENISCUS REPAIR     right knee   MOHS SURGERY Left 09/2020   cheek   PROSTATE SURGERY     REFRACTIVE SURGERY Right    piece of metal removed   SUBMANDIBULAR GLAND EXCISION  04/2011   SUBMANDIBULAR GLAND EXCISION  04/30/2011   Procedure: EXCISION SUBMANDIBULAR GLAND;  Surgeon: Jerrell Belfast, MD;  Location: [facility];  Service: ENT;  Laterality: Left;   WITH DIAGNOSTIC BIOPSY   TONSILLECTOMY     as a child   VEIN LIGATION AND STRIPPING     right leg   Family History  Problem Relation Age of Onset   Heart disease Father    Heart attack Father        x 3   Cancer Father    Cancer Sister        breast ca   Cancer Brother        prostate ca   Heart attack Sister    Cancer Sister        breast   Heart disease Brother    Alzheimer's disease Sister    Diabetes Sister    Heart disease Brother  Diabetes Brother    Heart disease Brother    Heart disease Brother    Heart disease Brother    Heart disease Brother    Heart disease Brother    Alzheimer's disease Brother    Diabetes Son    Cancer Brother    Anesthesia problems Neg Hx    Social History   Socioeconomic History   Marital status: Married    Spouse name: [name]   Number of children: 2   Years of education: GED   Highest education level: GED or equivalent  Occupational History   Occupation: Retired     Comment: Secretary/administrator   Occupation: Retired     Comment: [employer]  Tobacco Use   Smoking status: Former    Packs/day: 1.00    Years: 15.00    Total pack years: 15.00    Types: Cigarettes    Start date: 12/12/1962    Quit date: 08/25/1984    Years since quitting: 37.5   Smokeless tobacco: Never  Vaping Use   Vaping Use: Never used  Substance and Sexual Activity   Alcohol use: Not Currently   Drug use: Never   Sexual activity: Not Currently  Other Topics Concern   Not on file  Social History Narrative   Right handed   Decaf coffee (2-3 per day)   Lives with wife   Social Determinants of Health   Financial Resource Strain: Low Risk  (02/26/2022)   Overall Financial Resource Strain (CARDIA)    Difficulty of Paying Living Expenses: Not hard at all  Food Insecurity: No Food Insecurity (02/26/2022)   Hunger Vital Sign    Worried About Running Out of Food in the Last Year: Never true    [place] in the Last Year: Never true   Transportation Needs: No Transportation Needs (02/26/2022)   PRAPARE - Hydrologist (Medical): No    Lack of Transportation (Non-Medical): No  Physical Activity: Insufficiently Active (02/26/2022)   Exercise Vital Sign    Days of Exercise per Week: 3 days    Minutes of Exercise per Session: 30 min  Stress: No Stress Concern Present (02/26/2022)   [place]    Feeling of Stress : Not at all  Social Connections: Moderately Integrated (02/26/2022)   Social Connection and Isolation Panel [NHANES]    Frequency of Communication with Friends and Family: More than three times a week    Frequency of Social Gatherings with Friends and Family: More than three times a week    Attends Religious Services: More than 4 times per year    Active Member of [employer] or Organizations: No    Attends Music therapist: Never    Marital Status: Married    Tobacco Counseling Counseling given: Not Answered   Clinical Intake:  Pre-visit preparation completed: Yes  Pain : No/denies pain     Nutritional Risks: None Diabetes: No  How often do you need to have someone help you when you read instructions, pamphlets, or other written materials from your doctor or pharmacy?: 1 - Never  Diabetic?no   Interpreter Needed?: No  Information entered by :: Jadene Pierini, LPN   Activities of Daily Living    02/26/2022    9:32 AM 02/22/2022    1:00 PM  In your present state of health, do you have any difficulty performing the following activities:  Hearing? 0 0  Vision? 0 0  Difficulty concentrating or making decisions? 0 1  Walking or climbing stairs? 0 0  Dressing or bathing? 0 0  Doing errands, shopping? 0 0  Preparing Food and eating ? N N  Using the Toilet? N N  In the past six months, have you accidently leaked urine? N Y  Do you have problems with loss of bowel control? N N  Managing your  Medications? N N  Managing your Finances? N N  Housekeeping or managing your Housekeeping? N N    Patient Care Team: Dettinger, [name], MD as PCP - General (Family Medicine) Branch, Alphonse Guild, MD as PCP - Cardiology (Cardiology) Mealor, Yetta Barre, MD as PCP - Electrophysiology (Cardiology) [name], MD as Consulting Physician (Gastroenterology) [name], [name], MD as Consulting Physician (Dermatology)  Indicate any recent Medical Services you may have received from other than [facility] providers in the past year (date may be approximate).     Assessment:   This is a routine wellness examination for [facility].  Hearing/Vision screen Vision Screening - Comments:: Wears rx glasses - up to date with routine eye exams with  Dr.Christian   Dietary issues and exercise activities discussed: Current Exercise Habits: Home exercise routine, Type of exercise: walking, Time (Minutes): 30, Frequency (Times/Week): 3, Weekly Exercise (Minutes/Week): 90, Intensity: Mild, Exercise limited by: cardiac condition(s)   Goals Addressed             This Visit's Progress    AWV   On track    02/25/2020 AWV Goal: Fall Prevention  Over the next year, patient will decrease their risk for falls by: Using assistive devices, such as a cane or walker, as needed Identifying fall risks within their home and correcting them by: Removing throw rugs Adding handrails to stairs or ramps Removing clutter and keeping a clear pathway throughout the home Increasing light, especially at night Adding shower handles/bars Raising toilet seat Identifying potential personal risk factors for falls: Medication side effects Incontinence/urgency Vestibular dysfunction Hearing loss Musculoskeletal disorders Neurological disorders Orthostatic hypotension       DIET - EAT MORE FRUITS AND VEGETABLES   On track    Try to include 3-5 servings of  fruits and vegetables per day.     DIET - INCREASE WATER INTAKE   On track    Try to drink 6-8 glasses of water daily       Depression Screen    02/26/2022    9:31 AM 10/05/2021   10:28 AM 05/28/2021    8:29 AM 04/01/2021   10:27 AM 02/25/2021   11:51 AM 12/08/2020    1:02 PM 10/02/2020   10:41 AM  PHQ 2/9 Scores  PHQ - 2 Score 0 0 3 0 0 0 0  PHQ- 9 Score  0 5    0    Fall Risk    02/26/2022    9:27 AM 02/22/2022    1:00 PM 10/05/2021   10:28 AM 05/28/2021    8:29 AM 04/01/2021   10:27 AM  Fall Risk   Falls in the past year? 0 0 0 0 0  Number falls in past yr: 0 0     Injury with Fall? 0      Risk for fall due to : No Fall Risks      Follow up Falls prevention discussed        [place]:  Any stairs in or  around the home? Yes  If so, are there any without handrails? No  Home free of loose throw rugs in walkways, pet beds, electrical cords, etc? Yes  Adequate lighting in your home to reduce risk of falls? Yes   ASSISTIVE DEVICES UTILIZED TO PREVENT FALLS:  Life alert? No  Use of a cane, walker or w/c? No  Grab bars in the bathroom? No  Shower chair or bench in shower? No  Elevated toilet seat or a handicapped toilet? No       02/17/2018    1:07 PM 02/06/2015   11:55 AM  MMSE - Mini Mental State Exam  Orientation to time 5 5  Orientation to Place 5 5  Registration 3 3  Attention/ Calculation 5 5  Recall 2 3  Language- name 2 objects 2 2  Language- repeat 1 1  Language- follow 3 step command 3 3  Language- read & follow direction 1 1  Write a sentence 1 1  Copy design 1 1  Total score 29 30        02/26/2022    9:32 AM 02/25/2021   10:03 AM 02/25/2020   10:26 AM 02/21/2019    9:58 AM  6CIT Screen  What Year? 0 points 0 points 0 points 0 points  What month? 0 points 0 points  0 points  What time? 0 points 0 points 0 points 0 points  Count back from 20 0 points 0 points 0 points 0 points  Months in reverse 0 points 0  points 4 points 4 points  Repeat phrase 0 points 0 points 0 points 2 points  Total Score 0 points 0 points  6 points    Immunizations Immunization History  Administered Date(s) Administered   Fluad Quad(high Dose 65+) 09/12/2020, 09/23/2021   Influenza, High Dose Seasonal PF 10/12/2016, 10/01/2017   Influenza,inj,Quad PF,6+ Mos 09/27/2014, 10/16/2015   Influenza-Unspecified 09/11/2012, 11/11/2013, 10/12/2016, 09/12/2018   Moderna Sars-Covid-2 Vaccination 04/11/2019, 05/07/2019, 12/18/2019   Pneumococcal Conjugate-13 03/07/2013   Pneumococcal Polysaccharide-23 09/25/2012   Pneumococcal-Unspecified 04/03/2001, 08/09/2012   Tdap 08/11/2010   Zoster Recombinat (Shingrix) 02/17/2018, 08/05/2018    TDAP status: Due, Education has been provided regarding the importance of this vaccine. Advised may receive this vaccine at local pharmacy or Health Dept. Aware to provide a copy of the vaccination record if obtained from local pharmacy or Health Dept. Verbalized acceptance and understanding.  Flu Vaccine status: Up to date  Pneumococcal vaccine status: Up to date  Covid-19 vaccine status: Completed vaccines  Qualifies for Shingles Vaccine? Yes   Zostavax completed Yes   Shingrix Completed?: Yes  Screening Tests Health Maintenance  Topic Date Due   Diabetic kidney evaluation - Urine ACR  07/12/2018   DTaP/Tdap/Td (2 - Td or Tdap) 08/10/2020   COVID-19 Vaccine (4 - 2023-24 season) 09/11/2021   DEXA SCAN  04/02/2022 (Originally 09/28/2017)   HEMOGLOBIN A1C  04/05/2022   OPHTHALMOLOGY EXAM  09/01/2022   Diabetic kidney evaluation - eGFR measurement  01/29/2023   Medicare Annual Wellness (AWV)  02/27/2023   COLONOSCOPY (Pts 45-89yr Insurance coverage will need to be confirmed)  02/23/2024   Pneumonia Vaccine 77 Years old  Completed   INFLUENZA VACCINE  Completed   Hepatitis C Screening  Completed   Zoster Vaccines- Shingrix  Completed   HPV VACCINES  Aged Out   FOOT EXAM   Discontinued    Health Maintenance  Health Maintenance Due  Topic Date Due   Diabetic kidney evaluation - Urine  ACR  07/12/2018   DTaP/Tdap/Td (2 - Td or Tdap) 08/10/2020   COVID-19 Vaccine (4 - 2023-24 season) 09/11/2021    Colorectal cancer screening: No longer required.   Lung Cancer Screening: (Low Dose CT Chest recommended if Age 57-80 years, 30 pack-year currently smoking OR have quit w/in 15years.) does not qualify.   Lung Cancer Screening Referral: n/a  Additional Screening:  Hepatitis C Screening: does not qualify; Completed 03/22/2019  Vision Screening: Recommended annual ophthalmology exams for early detection of glaucoma and other disorders of the eye. Is the patient up to date with their annual eye exam?  Yes  Who is the provider or what is the name of the office in which the patient attends annual eye exams? Dr.Christians  If pt is not established with a provider, would they like to be referred to a provider to establish care? No .   Dental Screening: Recommended annual dental exams for proper oral hygiene  Community Resource Referral / Chronic Care Management: CRR required this visit?  No   CCM required this visit?  No      Plan:     I have personally reviewed and noted the following in the patient's chart:   Medical and social history Use of alcohol, tobacco or illicit drugs  Current medications and supplements including opioid prescriptions. Patient is not currently taking opioid prescriptions. Functional ability and status Nutritional status Physical activity Advanced directives List of other physicians Hospitalizations, surgeries, and ER visits in previous 12 months Vitals Screenings to include cognitive, depression, and falls Referrals and appointments  In addition, I have reviewed and discussed with patient certain preventive protocols, quality metrics, and best practice recommendations. A written personalized care plan for preventive  services as well as general preventive health recommendations were provided to patient.     Daphane Shepherd, LPN   [id number]   Nurse Notes: Due TDAP Vaccine , Will Have records from [facility]

## 2022-03-03 ENCOUNTER — Encounter: Payer: Self-pay | Admitting: Cardiology

## 2022-03-03 ENCOUNTER — Ambulatory Visit: Payer: Medicare Other | Attending: Cardiology | Admitting: Cardiology

## 2022-03-03 ENCOUNTER — Other Ambulatory Visit: Payer: Self-pay | Admitting: *Deleted

## 2022-03-03 VITALS — BP 104/60 | HR 71 | Ht 72.0 in | Wt 191.8 lb

## 2022-03-03 DIAGNOSIS — D6869 Other thrombophilia: Secondary | ICD-10-CM

## 2022-03-03 DIAGNOSIS — I10 Essential (primary) hypertension: Secondary | ICD-10-CM | POA: Diagnosis not present

## 2022-03-03 DIAGNOSIS — I480 Paroxysmal atrial fibrillation: Secondary | ICD-10-CM | POA: Diagnosis not present

## 2022-03-03 DIAGNOSIS — E782 Mixed hyperlipidemia: Secondary | ICD-10-CM | POA: Diagnosis not present

## 2022-03-03 DIAGNOSIS — I2510 Atherosclerotic heart disease of native coronary artery without angina pectoris: Secondary | ICD-10-CM | POA: Diagnosis not present

## 2022-03-03 MED ORDER — METOPROLOL SUCCINATE ER 25 MG PO TB24: 12.5000 mg | ORAL_TABLET | Freq: Two times a day (BID) | ORAL | 3 refills | Status: DC

## 2022-03-03 NOTE — Progress Notes (Signed)
Clinical Summary Anthony Kelly is a 77 y.o.male seen today for follow up of the following medical problems.    1. PAF - followed by EP, had been on flecanide however discontinued due to CAD - 02/17/22 had afib ablation with Dr Myles Gip  - no recent palpitations - he is on eliquis, no bleeding issues     2. CAD - Coronary CT angiography demonstrated nonobstructive coronary artery disease in the LAD, left circumflex, and RCA. - 05/2019 cath: prox LAD 25%, ramus 50%, RCA 25% - 03/2019 TC 137 TG 75 HDL 61 LDL 61 - 09/2020 TC 109 TG 128 HDL 49 LDL 38   08/2020 echo LVEF 60-65%, no WMAs   - no recent symptoms - compliant with meds   3. NSVT -no recent symptoms     4. Lymphoma - followed by oncology   5. Double vision - transient episode, seen in ER 07/24/20 - MRI/MRA no acute process, no significant cerebrovascular disease - followed by optho, pcp   6. HTN -compliant with meds  7. 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 04/2013 -adm with CP, elevated CRP. H/o coronary artery calcification on prior CT but nuc was negative, EF 71%.   Arthritis    Atrial fibrillation ([place])    a. Isolated episode in the setting of acute pericarditis 04/2013. Was not placed on anticoag.   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     brimonidine (ALPHAGAN) 0.2 % ophthalmic solution Place 1 drop into both eyes 2 (two) times daily.      cholecalciferol (VITAMIN D3) 25 MCG (1000 UNIT) tablet Take 1,000 Units by mouth daily.     colchicine 0.6 MG tablet Take 1 tablet (0.6 mg total) by mouth 2 (two) times daily for 5 days. 10 tablet 0   finasteride (PROSCAR) 5 MG tablet Take 1 tablet (5 mg total) by mouth daily. 90 tablet 3   Lifitegrast (XIIDRA) 5 % SOLN Place 1 drop into both eyes daily. (Patient taking differently: Place 1 drop into both eyes 2 (two) times daily.)     LUMIGAN 0.01 % SOLN Place 1 drop  into both eyes at bedtime.     meclizine (ANTIVERT) 25 MG tablet Take 1 tablet (25 mg total) by mouth 3 (three) times daily as needed for dizziness. 30 tablet 0   metoprolol succinate (TOPROL-XL) 25 MG 24 hr tablet Take 0.5 tablets (12.5 mg total) by mouth 2 (two) times daily. (Patient taking differently: Take 25 mg by mouth daily.) 90 tablet 3   mometasone (ELOCON) 0.1 % cream Apply 1 Application topically 2 (two) times daily as needed (eczema).     Multiple Vitamin (MULTIVITAMIN WITH MINERALS) TABS tablet Take 1 tablet by mouth daily.      omeprazole (PRILOSEC) 20 MG capsule Take 1 capsule (20 mg total) by mouth daily. (Patient taking differently: Take 20 mg by mouth every other day.) 90 capsule 3   Polyethyl Glycol-Propyl Glycol (SYSTANE OP) Place 1 drop into both eyes daily as needed (dry eyes).     Probiotic Product  (SUPERIOR PROBIOTIC) CAPS Take 1 capsule by mouth daily.     rosuvastatin (CRESTOR) 20 MG tablet Take 1 tablet (20 mg total) by mouth daily. 90 tablet 3   silodosin (RAPAFLO) 8 MG CAPS capsule Take 1 capsule (8 mg total) by mouth daily with breakfast. 90 capsule 3   No current facility-administered medications for this visit.     Past Surgical History:  Procedure Laterality Date   ATRIAL FIBRILLATION ABLATION N/A 02/17/2022   Procedure: ATRIAL FIBRILLATION ABLATION;  Surgeon: Melida Quitter, MD;  Location: [facility];  Service: Cardiovascular;  Laterality: N/A;   CHOLECYSTECTOMY     EXPLORATORY LAPAROTOMY     EYE SURGERY Right 2016   cataract   LEFT HEART CATH AND CORONARY ANGIOGRAPHY N/A 06/05/2019   Procedure: LEFT HEART CATH AND CORONARY ANGIOGRAPHY;  Surgeon: Jettie Booze, MD;  Location: [facility];  Service: Cardiovascular;  Laterality: N/A;   LUNG LOBECTOMY     right side   LYMPH NODE BIOPSY     in groin with removal   MENISCUS REPAIR     right knee   MOHS SURGERY Left 09/2020   cheek   PROSTATE SURGERY     REFRACTIVE SURGERY Right    piece of metal removed   SUBMANDIBULAR GLAND EXCISION  04/2011   SUBMANDIBULAR GLAND EXCISION  04/30/2011   Procedure: EXCISION SUBMANDIBULAR GLAND;  Surgeon: Jerrell Belfast, MD;  Location: [facility];  Service: ENT;  Laterality: Left;  WITH DIAGNOSTIC BIOPSY   TONSILLECTOMY     as a child   VEIN LIGATION AND STRIPPING     right leg     No Known Allergies    Family History  Problem Relation Age of Onset   Heart disease Father    Heart attack Father        x 3   Cancer Father    Cancer Sister        breast ca   Cancer Brother        prostate ca   Heart attack Sister    Cancer Sister        breast   Heart disease Brother    Alzheimer's disease Sister    Diabetes Sister    Heart disease Brother    Diabetes Brother    Heart disease Brother    Heart disease Brother    Heart disease Brother    Heart  disease Brother    Heart disease Brother    Alzheimer's disease Brother    Diabetes Son  Cancer Brother    Anesthesia problems Neg Hx      Social History Anthony Kelly reports that he quit smoking about 37 years ago. His smoking use included cigarettes. He started smoking about 59 years ago. He has a 15.00 pack-year smoking history. He has never used smokeless tobacco. Anthony Kelly reports that he does not currently use alcohol.   Review of Systems CONSTITUTIONAL: No weight loss, fever, chills, weakness or fatigue.  HEENT: Eyes: No visual loss, blurred vision, double vision or yellow sclerae.No hearing loss, sneezing, congestion, runny nose or sore throat.  SKIN: No rash or itching.  CARDIOVASCULAR: per hpi RESPIRATORY: No shortness of breath, cough or sputum.  GASTROINTESTINAL: No anorexia, nausea, vomiting or diarrhea. No abdominal pain or blood.  GENITOURINARY: No burning on urination, no polyuria NEUROLOGICAL: No headache, dizziness, syncope, paralysis, ataxia, numbness or tingling in the extremities. No change in bowel or bladder control.  MUSCULOSKELETAL: No muscle, back pain, joint pain or stiffness.  LYMPHATICS: No enlarged nodes. No history of splenectomy.  PSYCHIATRIC: No history of depression or anxiety.  ENDOCRINOLOGIC: No reports of sweating, cold or heat intolerance. No polyuria or polydipsia.  Marland Kitchen   Physical Examination Today's Vitals   03/03/22 0906  BP: 104/60  Pulse: 71  SpO2: 97%  Weight: 191 lb 12.8 oz (87 kg)  Height: 6' (1.829 m)   Body mass index is 26.01 kg/m.  Gen: resting comfortably, no acute distress HEENT: no scleral icterus, pupils equal round and reactive, no palptable cervical adenopathy,  CV: RRR, no m/r/g no jvd Resp: Clear to auscultation bilaterally GI: abdomen is soft, non-tender, non-distended, normal bowel sounds, no hepatosplenomegaly MSK: extremities are warm, no edema.  Skin: warm, no rash Neuro:  no focal deficits Psych:  appropriate affect   Diagnostic Studies  Coronary CT angiography 2019-05-20:   1. Left Main:  No significant stenosis.   2. LAD: No significant stenosis.  FFR 0.9 proximal, 0.84 distal 3. LCX: No significant stenosis. FFR 0.9 in the mid vessel, 0.84 distally 4. RCA: No significant stenosis. FFR 0.9 proximally, 0.87 mid, 0.80 distally   IMPRESSION: 1.  CT FFR analysis didn't show any significant stenosis.     Echocardiogram 05/22/2019:   1. Left ventricular ejection fraction, by estimation, is 60 to 65%. The  left ventricle has normal function. The left ventricle has no regional  wall motion abnormalities. There is mild left ventricular hypertrophy.  Left ventricular diastolic parameters  are indeterminate.   2. Right ventricular systolic function is normal. The right ventricular  size is mildly enlarged. Mildly increased right ventricular wall  thickness. Tricuspid regurgitation signal is inadequate for assessing PA  pressure.   3. Left atrial size was mildly dilated.   4. The mitral valve is grossly normal. Trivial mitral valve  regurgitation.   5. The aortic valve is tricuspid. Aortic valve regurgitation is not  visualized.   6. The inferior vena cava is normal in size with greater than 50%  respiratory variability, suggesting right atrial pressure of 3 mmHg.   08/2020 echo IMPRESSIONS     1. Left ventricular ejection fraction, by estimation, is 60 to 65%. The  left ventricle has normal function. The left ventricle has no regional  wall motion abnormalities. There is mild left ventricular hypertrophy.  Left ventricular diastolic parameters  were normal.   2. Right ventricular systolic function is normal. The right ventricular  size is normal. Tricuspid regurgitation signal is inadequate for assessing  PA pressure.   3. The  mitral valve is normal in structure. No evidence of mitral valve  regurgitation. No evidence of mitral stenosis.   4. The aortic valve is  tricuspid. Aortic valve regurgitation is not  visualized. No aortic stenosis is present.   5. The inferior vena cava is normal in size with greater than 50%  respiratory variability, suggesting right atrial pressure of 3 mmHg.     Assessment and Plan   1. PAF/acquired thrombophilia - s/p recent ablation - no symptoms - EKG shows he is in SR today - continue eliquis for stroke prevention   2. HTN - at goal, continue current meds  3. Hyperlipidemia - at goal, continue current meds  4. CAD -mild to mod disease by cath - no symptoms, continue current meds. No ASA since on eliquis    Arnoldo Lenis, M.D.

## 2022-03-03 NOTE — Patient Instructions (Addendum)

## 2022-03-04 ENCOUNTER — Encounter: Payer: Self-pay | Admitting: Urology

## 2022-03-05 ENCOUNTER — Other Ambulatory Visit: Payer: Self-pay

## 2022-03-05 MED ORDER — FINASTERIDE 5 MG PO TABS: 5.0000 mg | ORAL_TABLET | Freq: Every day | ORAL | 3 refills | Status: DC

## 2022-03-10 DIAGNOSIS — C8290 Follicular lymphoma, unspecified, unspecified site: Secondary | ICD-10-CM | POA: Diagnosis not present

## 2022-03-10 DIAGNOSIS — Z87891 Personal history of nicotine dependence: Secondary | ICD-10-CM | POA: Diagnosis not present

## 2022-03-10 DIAGNOSIS — E785 Hyperlipidemia, unspecified: Secondary | ICD-10-CM | POA: Diagnosis not present

## 2022-03-10 DIAGNOSIS — C8223 Follicular lymphoma grade III, unspecified, intra-abdominal lymph nodes: Secondary | ICD-10-CM | POA: Diagnosis not present

## 2022-03-17 ENCOUNTER — Telehealth: Payer: Self-pay | Admitting: Cardiovascular Disease

## 2022-03-17 ENCOUNTER — Ambulatory Visit (HOSPITAL_COMMUNITY)
Admission: RE | Admit: 2022-03-17 | Discharge: 2022-03-17 | Disposition: A | Discharge status: Home / Self Care | Payer: Medicare Other | Source: Ambulatory Visit | Attending: Physician Assistant | Admitting: Physician Assistant

## 2022-03-17 VITALS — BP 120/70 | HR 73 | Ht 72.0 in | Wt 191.6 lb

## 2022-03-17 DIAGNOSIS — D6869 Other thrombophilia: Secondary | ICD-10-CM

## 2022-03-17 DIAGNOSIS — Z7901 Long term (current) use of anticoagulants: Secondary | ICD-10-CM | POA: Diagnosis not present

## 2022-03-17 DIAGNOSIS — Z87891 Personal history of nicotine dependence: Secondary | ICD-10-CM | POA: Diagnosis not present

## 2022-03-17 DIAGNOSIS — C8590 Non-Hodgkin lymphoma, unspecified, unspecified site: Secondary | ICD-10-CM | POA: Diagnosis not present

## 2022-03-17 DIAGNOSIS — I2510 Atherosclerotic heart disease of native coronary artery without angina pectoris: Secondary | ICD-10-CM | POA: Insufficient documentation

## 2022-03-17 DIAGNOSIS — I480 Paroxysmal atrial fibrillation: Secondary | ICD-10-CM

## 2022-03-17 DIAGNOSIS — Z79899 Other long term (current) drug therapy: Secondary | ICD-10-CM | POA: Insufficient documentation

## 2022-03-17 NOTE — Telephone Encounter (Signed)
Patient called to talk with Dr. Myles Gip or nurse. Please call back

## 2022-03-17 NOTE — Progress Notes (Signed)
Primary Care Physician: Dettinger, [name], MD Primary Cardiologist: Dr Carlyle Dolly Primary Electrophysiologist: Dr Myles Gip Referring Physician: Dr Weldon Inches Anthony Kelly is a 77 y.o. male with a history of CAD, lymphoma, NSVT, atrial fibrillation who presents for follow up in the [facility].  The patient had been maintained on flecainide but was discontinued due to his diagnosis of CAD. Patient is on Eliquis for a CHADS2VASC score of 3. Patient underwent afib ablation with Dr Myles Gip on 02/17/22.  On follow up today, patient has had some irregular beats noted on his smart watch but no sustained episodes of afib. He denies chest pain, swallowing pain, or groin issues. No bleeding issues on anticoagulation. Of note, he is scheduled for a lymph node biopsy for his lymphoma next week.   Today, he denies symptoms of palpitations, chest pain, shortness of breath, orthopnea, PND, lower extremity edema, dizziness, presyncope, syncope, snoring, daytime somnolence, bleeding, or neurologic sequela. The patient is tolerating medications without difficulties and is otherwise without complaint today.    Atrial Fibrillation Risk Factors:  he does not have symptoms or diagnosis of sleep apnea. he does not have a history of rheumatic fever.   he has a BMI of Body mass index is 25.99 kg/m.Marland Kitchen Filed Weights   03/17/22 1322  Weight: 86.9 kg    Family History  Problem Relation Age of Onset   Heart disease Father    Heart attack Father        x 3   Cancer Father    Cancer Sister        breast ca   Cancer Brother        prostate ca   Heart attack Sister    Cancer Sister        breast   Heart disease Brother    Alzheimer's disease Sister    Diabetes Sister    Heart disease Brother    Diabetes Brother    Heart disease Brother    Heart disease Brother    Heart disease Brother    Heart disease Brother    Heart disease Brother    Alzheimer's disease Brother     Diabetes Son    Cancer Brother    Anesthesia problems Neg Hx      Atrial Fibrillation Management history:  Previous antiarrhythmic drugs: flecainide Previous cardioversions: none Previous ablations: 02/17/22 CHADS2VASC score: 3 Anticoagulation history: Eliquis   Past Medical History:  Diagnosis Date   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 04/2013 -adm with CP, elevated CRP. H/o coronary artery calcification on prior CT but nuc was negative, EF 71%.   Arthritis    Atrial fibrillation ([place])    a. Isolated episode in the setting of acute pericarditis 04/2013. Was not placed on anticoag.   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'd 01/2007   Metastasis to lymph nodes ([place]) dx'd  03/2011   lt submandibular ln   Pain in joint, pelvic region and thigh 08/01/2013   PSVT (paroxysmal supraventricular tachycardia)    SCC (squamous cell carcinoma)    Left cheek   Past Surgical History:  Procedure Laterality Date   ATRIAL FIBRILLATION ABLATION N/A 02/17/2022   Procedure: ATRIAL FIBRILLATION ABLATION;  Surgeon: Melida Quitter, MD;  Location: [facility];  Service: Cardiovascular;  Laterality: N/A;   CHOLECYSTECTOMY     EXPLORATORY LAPAROTOMY     EYE SURGERY Right 2016   cataract   LEFT HEART CATH AND CORONARY ANGIOGRAPHY N/A 06/05/2019   Procedure: LEFT HEART CATH AND CORONARY ANGIOGRAPHY;  Surgeon: Jettie Booze, MD;   Location: [facility];  Service: Cardiovascular;  Laterality: N/A;   LUNG LOBECTOMY     right side   LYMPH NODE BIOPSY     in groin with removal   MENISCUS REPAIR     right knee   MOHS SURGERY Left 09/2020   cheek   PROSTATE SURGERY     REFRACTIVE SURGERY Right    piece of metal removed   SUBMANDIBULAR GLAND EXCISION  04/2011   SUBMANDIBULAR GLAND EXCISION  04/30/2011   Procedure: EXCISION SUBMANDIBULAR GLAND;  Surgeon: Jerrell Belfast, MD;  Location: [facility];  Service: ENT;  Laterality: Left;  WITH DIAGNOSTIC BIOPSY   TONSILLECTOMY     as a child   VEIN LIGATION AND STRIPPING     right leg    Current Outpatient Medications  Medication Sig Dispense Refill   apixaban (ELIQUIS) 5 MG TABS tablet TAKE 1 TABLET TWICE A DAY 180 tablet 1   Ascorbic Acid (VITAMIN C) 1000 MG tablet Take 1,000 mg by mouth daily.     brimonidine (ALPHAGAN) 0.2 % ophthalmic solution Place 1 drop into both eyes 2 (two) times daily.      cholecalciferol (VITAMIN D3) 25 MCG (1000 UNIT) tablet Take 1,000 Units by mouth daily.     finasteride (PROSCAR) 5 MG tablet Take 1 tablet (5 mg total) by mouth daily. 90 tablet 3   Lifitegrast (XIIDRA) 5 % SOLN Apply 1 drop to eye 2 (two) times daily.     LUMIGAN 0.01 % SOLN Place 1 drop into both eyes at bedtime.     meclizine (ANTIVERT) 25 MG tablet Take 1 tablet (25 mg total) by mouth 3 (three) times daily as needed for dizziness. 30 tablet 0   metoprolol succinate (TOPROL-XL) 25 MG 24 hr tablet Take 0.5 tablets (12.5 mg total) by mouth 2 (two) times daily. 90 tablet 3   mometasone (ELOCON) 0.1 % cream Apply 1 Application topically 2 (two) times daily as needed (eczema).     Multiple Vitamin (MULTIVITAMIN WITH MINERALS) TABS tablet Take 1 tablet by mouth daily.      omeprazole (PRILOSEC OTC) 20 MG tablet Take 20 mg by mouth every other day.     Polyethyl Glycol-Propyl Glycol (SYSTANE OP) Place 1 drop into both eyes daily as needed (dry eyes).     Probiotic Product  (SUPERIOR PROBIOTIC) CAPS Take 1 capsule by mouth daily.     rosuvastatin (CRESTOR) 20 MG tablet Take 1 tablet (20 mg total) by mouth daily. 90 tablet 3   silodosin (RAPAFLO) 8 MG CAPS capsule Take 1 capsule (8 mg total) by mouth daily with breakfast. 90 capsule 3   No current facility-administered medications for this encounter.    No Known Allergies  Social History   Socioeconomic History   Marital status:  Married    Spouse name: [name]   Number of children: 2   Years of education: GED   Highest education level: GED or equivalent  Occupational History   Occupation: Retired     Comment: Secretary/administrator   Occupation: Retired     Comment: [employer]  Tobacco Use   Smoking status: Former    Packs/day: 1.00    Years: 15.00    Total pack years: 15.00    Types: Cigarettes    Start date: 12/12/1962    Quit date: 08/25/1984    Years since quitting: 37.5   Smokeless tobacco: Never  Vaping Use   Vaping Use: Never used  Substance and Sexual Activity   Alcohol use: Not Currently   Drug use: Never   Sexual activity: Not Currently  Other Topics Concern   Not on file  Social History Narrative   Right handed   Decaf coffee (2-3 per day)   Lives with wife   Social Determinants of Health   Financial Resource Strain: Low Risk  (02/26/2022)   Overall Financial Resource Strain (CARDIA)    Difficulty of Paying Living Expenses: Not hard at all  Food Insecurity: No Food Insecurity (02/26/2022)   Hunger Vital Sign    Worried About Running Out of Food in the Last Year: Never true    [place] in the Last Year: Never true  Transportation Needs: No Transportation Needs (02/26/2022)   PRAPARE - Hydrologist (Medical): No    Lack of Transportation (Non-Medical): No  Physical Activity: Insufficiently Active (02/26/2022)   Exercise Vital Sign    Days of Exercise per Week: 3 days    Minutes of Exercise per Session: 30 min  Stress: No Stress Concern Present  (02/26/2022)   [place]    Feeling of Stress : Not at all  Social Connections: Moderately Integrated (02/26/2022)   Social Connection and Isolation Panel [NHANES]    Frequency of Communication with Friends and Family: More than three times a week    Frequency of Social Gatherings with Friends and Family: More than three times a week    Attends Religious Services: More than 4 times per year    Active Member of [employer] or Organizations: No    Attends Archivist Meetings: Never    Marital Status: Married  Human resources officer Violence: Not At Risk (02/26/2022)   Humiliation, Afraid, Rape, and Kick questionnaire    Fear of Current or Ex-Partner: No    Emotionally Abused: No    Physically Abused: No    Sexually Abused: No     ROS- All systems are reviewed and negative except as per the HPI above.  Physical Exam: Vitals:   03/17/22 1322  BP: 120/70  Pulse: 73  Weight: 86.9 kg  Height: 6' (1.829 m)    GEN- The patient is a well appearing elderly male, alert and oriented x 3 today.   Head- normocephalic, atraumatic Eyes-  Sclera clear, conjunctiva pink Ears- hearing intact Oropharynx- clear Neck- supple  Lungs- Clear to ausculation bilaterally, normal work of breathing Heart- Regular rate and rhythm, no murmurs, rubs or gallops  GI- soft, NT, ND, + BS Extremities- no clubbing, cyanosis, or edema MS- no significant deformity or atrophy Skin- no rash or lesion Psych- euthymic mood, full affect Neuro- strength and sensation are intact  Wt Readings from Last 3 Encounters:  03/17/22 86.9 kg  03/03/22 87  kg  02/26/22 82.6 kg    EKG today demonstrates  SR Vent. rate 73 BPM PR interval 150 ms QRS duration 80 ms QT/QTcB 386/425 ms  Echo 09/02/20 demonstrated   1. Left ventricular ejection fraction, by estimation, is 60 to 65%. The  left ventricle has normal function. The left ventricle has no regional   wall motion abnormalities. There is mild left ventricular hypertrophy.  Left ventricular diastolic parameters were normal.   2. Right ventricular systolic function is normal. The right ventricular  size is normal. Tricuspid regurgitation signal is inadequate for assessing  PA pressure.   3. The mitral valve is normal in structure. No evidence of mitral valve  regurgitation. No evidence of mitral stenosis.   4. The aortic valve is tricuspid. Aortic valve regurgitation is not  visualized. No aortic stenosis is present.   5. The inferior vena cava is normal in size with greater than 50%  respiratory variability, suggesting right atrial pressure of 3 mmHg.   Epic records are reviewed at length today  CHA2DS2-VASc Score = 3  The patient's score is based upon: CHF History: 0 HTN History: 0 Diabetes History: 0 Stroke History: 0 Vascular Disease History: 1 Age Score: 2 Gender Score: 0       ASSESSMENT AND PLAN: 1. Paroxysmal Atrial Fibrillation (ICD10:  I48.0) The patient's CHA2DS2-VASc score is 3, indicating a 3.2% annual risk of stroke.   S/p afib ablation 02/17/22 Continue Eliquis 5 mg BID. Will reach out to Dr Myles Gip regarding holding Eliquis for biopsy. Typically, anticoagulation is not held within the first 3 months post ablation due to elevated stroke risk.  Continue Toprol 12.5 mg BID  2. Secondary Hypercoagulable State (ICD10:  D68.69) The patient is at significant risk for stroke/thromboembolism based upon his CHA2DS2-VASc Score of 3.  Continue Apixaban (Eliquis).   3. CAD Mild non obstructive disease on LHC 2021 No anginal symptoms.   4. Lymphoma Follow[facility] by Dr Thera Flake [facility]    Follow up with Dr Myles Gip as scheduled.    [facility] [address] [place], [place] [address] [phone] 03/17/2022 5:07 PM

## 2022-03-17 NOTE — Telephone Encounter (Signed)
Returned call to patient's wife [name].  [name] states patient's oncologist would like to perform a needle biopsy on Monday 03/22/2022. Patient's oncologist has recommended holding Eliquis Saturday evening, Sunday, and morning of procedure.  Patient had A-Fib ablation performed on 02/17/2022. He had follow-up with [facility] today who advised him to reach out to Dr. Myles Gip to advise on oncologist's recommendation to hold Eliquis.  Will forward to Dr. Myles Gip to review and advise.

## 2022-03-18 ENCOUNTER — Encounter: Payer: Self-pay | Admitting: Cardiovascular Disease

## 2022-03-18 NOTE — Telephone Encounter (Signed)
Called pt advised of MD response:  After a month, it should be ok to hold anticoagulation for 48-72 hours.    Pt thanked me for calling.  No further concerns voiced.

## 2022-03-19 NOTE — Telephone Encounter (Signed)
Spoke with patient's wife to advise per Dr Myles Gip that it would ok to hold Eliquis for 48-72 hours for biopsy procedure.  [name] verbalized understanding and had no questions.

## 2022-03-24 DIAGNOSIS — C8290 Follicular lymphoma, unspecified, unspecified site: Secondary | ICD-10-CM | POA: Diagnosis not present

## 2022-03-24 DIAGNOSIS — I480 Paroxysmal atrial fibrillation: Secondary | ICD-10-CM | POA: Diagnosis not present

## 2022-03-24 DIAGNOSIS — N400 Enlarged prostate without lower urinary tract symptoms: Secondary | ICD-10-CM | POA: Diagnosis not present

## 2022-03-24 DIAGNOSIS — R590 Localized enlarged lymph nodes: Secondary | ICD-10-CM | POA: Diagnosis not present

## 2022-03-24 DIAGNOSIS — E785 Hyperlipidemia, unspecified: Secondary | ICD-10-CM | POA: Diagnosis not present

## 2022-03-24 DIAGNOSIS — C8296 Follicular lymphoma, unspecified, intrapelvic lymph nodes: Secondary | ICD-10-CM | POA: Diagnosis not present

## 2022-03-24 DIAGNOSIS — C8590 Non-Hodgkin lymphoma, unspecified, unspecified site: Secondary | ICD-10-CM | POA: Diagnosis not present

## 2022-03-29 ENCOUNTER — Telehealth: Payer: Self-pay

## 2022-03-29 ENCOUNTER — Inpatient Hospital Stay
Admission: RE | Admit: 2022-03-29 | Discharge: 2022-03-29 | Disposition: A | Discharge status: Home / Self Care | Payer: Self-pay | Source: Ambulatory Visit | Attending: Hematology and Oncology | Admitting: Hematology and Oncology

## 2022-03-29 ENCOUNTER — Other Ambulatory Visit: Payer: Self-pay

## 2022-03-29 DIAGNOSIS — C8280 Other types of follicular lymphoma, unspecified site: Secondary | ICD-10-CM

## 2022-03-29 NOTE — Telephone Encounter (Signed)
Called and scheduled appt at 2 pm tomorrow to see Dr. Alvy Bimler with wife. She is aware of appt.  Notified radiology of needing PET scan from [facility].

## 2022-03-29 NOTE — Telephone Encounter (Signed)
-----   Message from [name], MD sent at 03/29/2022  1:50 PM EDT ----- Regarding: tomorrow at 2 pm Pls schedule 45 mins Please get his PET image downloaded

## 2022-03-30 ENCOUNTER — Telehealth: Payer: Self-pay | Admitting: Pharmacy Technician

## 2022-03-30 ENCOUNTER — Encounter: Payer: Self-pay | Admitting: Hematology and Oncology

## 2022-03-30 ENCOUNTER — Telehealth: Payer: Self-pay

## 2022-03-30 ENCOUNTER — Inpatient Hospital Stay: Payer: Medicare Other

## 2022-03-30 ENCOUNTER — Other Ambulatory Visit (HOSPITAL_COMMUNITY): Payer: Self-pay

## 2022-03-30 ENCOUNTER — Inpatient Hospital Stay: Payer: Medicare Other | Attending: Hematology and Oncology | Admitting: Hematology and Oncology

## 2022-03-30 VITALS — BP 118/66 | HR 54 | Temp 97.7°F | Resp 18 | Ht 72.0 in | Wt 190.2 lb

## 2022-03-30 DIAGNOSIS — Z7901 Long term (current) use of anticoagulants: Secondary | ICD-10-CM | POA: Diagnosis not present

## 2022-03-30 DIAGNOSIS — Z5112 Encounter for antineoplastic immunotherapy: Secondary | ICD-10-CM | POA: Insufficient documentation

## 2022-03-30 DIAGNOSIS — C8280 Other types of follicular lymphoma, unspecified site: Secondary | ICD-10-CM

## 2022-03-30 DIAGNOSIS — C8288 Other types of follicular lymphoma, lymph nodes of multiple sites: Secondary | ICD-10-CM | POA: Insufficient documentation

## 2022-03-30 LAB — CMP (CANCER CENTER ONLY)
ALT: 16 U/L (ref 0–44)
AST: 18 U/L (ref 15–41)
Albumin: 4.4 g/dL (ref 3.5–5.0)
Alkaline Phosphatase: 57 U/L (ref 38–126)
Anion gap: 7 (ref 5–15)
BUN: 13 mg/dL (ref 8–23)
CO2: 30 mmol/L (ref 22–32)
Calcium: 9.4 mg/dL (ref 8.9–10.3)
Chloride: 104 mmol/L (ref 98–111)
Creatinine: 0.98 mg/dL (ref 0.61–1.24)
GFR, Estimated: >60 mL/min (ref >60)
Glucose, Bld: 96 mg/dL (ref 70–99)
Potassium: 4.4 mmol/L (ref 3.5–5.1)
Sodium: 141 mmol/L (ref 135–145)
Total Bilirubin: 0.8 mg/dL (ref 0.3–1.2)
Total Protein: 6.6 g/dL (ref 6.5–8.1)

## 2022-03-30 LAB — HEPATITIS B CORE ANTIBODY, IGM: Hep B C IgM: NONREACTIVE

## 2022-03-30 LAB — CBC WITH DIFFERENTIAL (CANCER CENTER ONLY)
Abs Immature Granulocytes: 0.02 10*3/uL (ref 0.00–0.07)
Basophils Absolute: 0.1 10*3/uL (ref 0.0–0.1)
Basophils Relative: 1 %
Eosinophils Absolute: 0.3 10*3/uL (ref 0.0–0.5)
Eosinophils Relative: 4 %
HCT: 45.5 % (ref 39.0–52.0)
Hemoglobin: 15.6 g/dL (ref 13.0–17.0)
Immature Granulocytes: 0 %
Lymphocytes Relative: 23 %
Lymphs Abs: 1.8 10*3/uL (ref 0.7–4.0)
MCH: 29.9 pg (ref 26.0–34.0)
MCHC: 34.3 g/dL (ref 30.0–36.0)
MCV: 87.2 fL (ref 80.0–100.0)
Monocytes Absolute: 0.7 10*3/uL (ref 0.1–1.0)
Monocytes Relative: 9 %
Neutro Abs: 4.9 10*3/uL (ref 1.7–7.7)
Neutrophils Relative %: 63 %
Platelet Count: 192 10*3/uL (ref 150–400)
RBC: 5.22 MIL/uL (ref 4.22–5.81)
RDW: 12.7 % (ref 11.5–15.5)
WBC Count: 7.9 10*3/uL (ref 4.0–10.5)
nRBC: 0 % (ref 0.0–0.2)

## 2022-03-30 LAB — URIC ACID: Uric Acid, Serum: 4.5 mg/dL (ref 3.7–8.6)

## 2022-03-30 LAB — HEPATITIS B SURFACE ANTIGEN: Hepatitis B Surface Ag: NONREACTIVE

## 2022-03-30 LAB — LACTATE DEHYDROGENASE: LDH: 131 U/L (ref 98–192)

## 2022-03-30 MED ORDER — PROCHLORPERAZINE MALEATE 10 MG PO TABS: 10.0000 mg | ORAL_TABLET | Freq: Four times a day (QID) | ORAL | 1 refills | Status: DC | PRN

## 2022-03-30 MED ORDER — ACYCLOVIR 400 MG PO TABS: 400.0000 mg | ORAL_TABLET | Freq: Every day | ORAL | 4 refills | Status: DC

## 2022-03-30 MED ORDER — ALLOPURINOL 300 MG PO TABS: 300.0000 mg | ORAL_TABLET | Freq: Every day | ORAL | 1 refills | Status: DC

## 2022-03-30 MED ORDER — LENALIDOMIDE 20 MG PO CAPS: 20.0000 mg | ORAL_CAPSULE | Freq: Every day | ORAL | 0 refills | Status: DC

## 2022-03-30 MED ORDER — ONDANSETRON HCL 8 MG PO TABS: 8.0000 mg | ORAL_TABLET | Freq: Three times a day (TID) | ORAL | 1 refills | Status: DC | PRN

## 2022-03-30 MED ORDER — PREDNISONE 50 MG PO TABS: 50.0000 mg | ORAL_TABLET | Freq: Every day | ORAL | 0 refills | Status: DC

## 2022-03-30 NOTE — Assessment & Plan Note (Signed)
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 At relapse, single-agent rituximab is commonly administered. Data suggest the immunomodulatory agent lenalidomide could increase the activity of rituximab.  METHODS  A phase III, multicenter, randomized trial of lenalidomide plus rituximab versus placebo plus rituximab was conducted in patients with relapsed and/or refractory follicular or marginal zone lymphoma. Patients received lenalidomide or placebo for 12 cycles plus rituximab once per week for 4 weeks in cycle 1 and day 1 of cycles 2 through 5. The primary end point was progression-free survival per independent radiology review.  RESULTS  A total of 358 patients were randomly assigned to lenalidomide plus rituximab (n = 178) or placebo plus rituximab (n = 180). Infections (63% v 49%), neutropenia (58% v 23%), and cutaneous reactions (32% v 12%) were more common with lenalidomide plus rituximab. Grade 3 or 4 neutropenia (50% v 13%) and leukopenia (7% v 2%) were higher with lenalidomide plus rituximab; no other grade 3 or 4 adverse event differed by 5% or more between groups. Progression-free survival was significantly improved for lenalidomide plus rituximab versus placebo plus rituximab, with a hazard ratio of 0.46 (95% CI, 0.34 to 0.62; P , .001) and median duration of 39.4 months (95% CI, 22.9 months to not reached) versus 14.1 months (95% CI, 11.4 to 16.7 months), respectively.  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  We discussed some of the risks, benefits and side-effects of Rituximab with Lenalidomide.   Some of the short term side-effects included, though not limited to, risk of fatigue, weight loss, tumor lysis syndrome, risk of allergic reactions, pancytopenia, blood clots, life-threatening infections, need for transfusions of blood products, nausea, vomiting, change in bowel habits, admission to hospital for various reasons, and risks of death.   Long term side-effects are also discussed including permanent damage to nerve function, chronic fatigue, and rare secondary malignancy including bone marrow disorders.   The patient is aware that the response rates discussed earlier is not guaranteed.    After a long discussion, patient made an informed decision to proceed with the prescribed plan of care.   Patient education material was dispensed I recommend the patient to resume taking acyclovir He will also take allopurinol for first 30 days of reduce risk of hyperuricemia/tumor lysis I recommend the patient to take 50 mg of prednisone the day before and the day of cycle 1 day 1 of rituximab to reduce risk of infusion reaction I recommend he start lenalidomide on day 2 of his therapy For the first month of treatment, he will get weekly rituximab and we will check his labs I will see him prior to cycle 1, week 3 of treatment Recommend minimum 3 cycles of therapy before we repeat imaging studies He will continue his Eliquis that will reduce the risk of thrombosis while on lenalidomide

## 2022-03-30 NOTE — Progress Notes (Signed)
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 At relapse, single-agent rituximab is commonly administered. Data suggest the immunomodulatory agent lenalidomide could increase the activity of rituximab.  METHODS  A phase III, multicenter, randomized trial of lenalidomide plus rituximab versus placebo plus rituximab was conducted in patients with relapsed and/or refractory follicular or marginal zone lymphoma. Patients received lenalidomide or placebo for 12 cycles plus rituximab once per week for 4 weeks in cycle 1 and day 1 of cycles 2 through 5. The primary end point was progression-free survival per independent radiology review.  RESULTS  A total of 358 patients were randomly assigned to lenalidomide plus rituximab (n = 178) or placebo plus rituximab (n = 180). Infections (63% v 49%), neutropenia (58% v 23%), and cutaneous reactions (32% v 12%) were more common with lenalidomide plus rituximab. Grade 3 or 4 neutropenia (50% v 13%) and leukopenia (7% v 2%) were higher  with lenalidomide plus rituximab; no other grade 3 or 4 adverse event differed by 5% or more between groups. Progression-free survival was significantly improved for lenalidomide plus rituximab versus placebo plus rituximab, with a hazard ratio of 0.46 (95% CI, 0.34 to 0.62; P , .001) and median duration of 39.4 months (95% CI, 22.9 months to not reached) versus 14.1 months (95% CI, 11.4 to 16.7 months), respectively.  CONCLUSION  Lenalidomide improved efficacy of rituximab in patients with recurrent indolent lymphoma, with an acceptable safety profile.  We discussed some of the risks, benefits and side-effects of Rituximab with Lenalidomide.   Some of the short term side-effects included, though not limited to, risk of fatigue, weight loss, tumor lysis syndrome, risk of allergic reactions, pancytopenia, blood clots, life-threatening infections, need for transfusions of blood products, nausea, vomiting, change in bowel habits, admission to hospital for various reasons, and risks of death.   Long term side-effects are also discussed including permanent damage to nerve function, chronic fatigue, and rare secondary malignancy including bone marrow disorders.   The patient is aware that the response rates discussed earlier is not guaranteed.    After a long discussion, patient made an informed decision to proceed with the prescribed plan of care.   Patient education material was dispensed I recommend the patient to resume taking acyclovir He will also take allopurinol for first 30 days of reduce risk of hyperuricemia/tumor lysis I recommend the patient to take 50 mg of prednisone the day before and the day of cycle 1 day 1 of rituximab to reduce risk of infusion reaction I recommend he start lenalidomide on day 2 of his therapy For the first month of treatment, he will get weekly rituximab and we will check his labs I will see him prior  to cycle 1, week 3 of treatment Recommend minimum 3 cycles of  therapy before we repeat imaging studies He will continue his Eliquis that will reduce the risk of thrombosis while on lenalidomide   Orders Placed This Encounter  Procedures   CBC with Differential ([place] Only)    Standing Status:   Future    Standing Expiration Date:   04/15/2023   CMP ([place] only)    Standing Status:   Future    Standing Expiration Date:   04/15/2023   CBC with Differential ([place] Only)    Standing Status:   Future    Standing Expiration Date:   04/22/2023   CMP ([place] only)    Standing Status:   Future    Standing Expiration Date:   04/22/2023   CBC with Differential ([place] Only)    Standing Status:   Future    Standing Expiration Date:   04/29/2023   CMP ([place] only)    Standing Status:   Future    Standing Expiration Date:   04/29/2023   CBC with Differential ([place] Only)    Standing Status:   Future    Standing Expiration Date:   05/06/2023   CMP ([place] only)    Standing Status:   Future    Standing Expiration Date:   05/06/2023   CBC with Differential ([place] Only)    Standing Status:   Future    Standing Expiration Date:   06/03/2023   CMP ([place] only)    Standing Status:   Future    Standing Expiration Date:   06/03/2023   CBC with Differential ([facility] Only)    Standing Status:   Future    Standing Expiration Date:   07/01/2023   CMP ([place] only)    Standing Status:   Future    Standing Expiration Date:   07/01/2023   CBC with Differential ([facility] Only)    Standing Status:   Future    Standing Expiration Date:   07/29/2023   CMP ([place] only)    Standing Status:   Future    Standing Expiration Date:   07/29/2023   Lactate dehydrogenase    Standing Status:   Future    Number of Occurrences:   1    Standing Expiration Date:   03/30/2023   Uric acid    Standing Status:   Future    Number of Occurrences:   1    Standing Expiration Date:   03/30/2023    CBC with Differential ([place] Only)    Standing Status:   Future    Number of Occurrences:   1    Standing Expiration Date:   03/30/2023   CMP ([place] only)    Standing Status:   Future    Number of Occurrences:   1    Standing Expiration Date:   03/30/2023   Hepatitis B surface antigen    Standing Status:   Future    Number of Occurrences:   1    Standing Expiration Date:   03/30/2023   Hepatitis B core antibody, IgM    Standing Status:   Future    Number of Occurrences:   1    Standing Expiration Date:   03/30/2023    All questions were answered. The patient knows to call the clinic with any problems, questions or concerns. The total time spent in the appointment was 40 minutes  encounter with patients including review of chart and various tests results, [name], MD 03/30/2022 3:40 PM  INTERVAL HISTORY: Please see below for problem oriented charting. he returns for treatment follow-up for discussion about plan of care and treatment He is here accompanied by his wife He was followed at [facility] recently with repeat PET/CT imaging and biopsy that confirmed cancer relapse Our plan will be to get him started on treatment with combination of lenalidomide with rituximab  REVIEW OF SYSTEMS:   Constitutional: Denies fevers, chills or abnormal weight loss Eyes: Denies blurriness of vision Ears, nose, mouth, throat, and face: Denies mucositis or sore throat Respiratory: Denies cough, dyspnea or wheezes Cardiovascular: Denies palpitation, chest discomfort or lower extremity swelling Gastrointestinal:  Denies nausea, heartburn or change in bowel habits Skin: Denies abnormal skin rashes Lymphatics: Denies new lymphadenopathy or easy bruising Neurological:Denies numbness, tingling or new weaknesses Behavioral/Psych: Mood is stable, no new changes  All other systems were reviewed with the patient and are  negative.  I have reviewed the past medical history, past surgical history, social history and family history with the patient and they are unchanged from previous note.  ALLERGIES:  has No Known Allergies.  MEDICATIONS:  Current Outpatient Medications  Medication Sig Dispense Refill   allopurinol (ZYLOPRIM) 300 MG tablet Take 1 tablet (300 mg total) by mouth daily. 30 tablet 1   predniSONE (DELTASONE) 50 MG tablet Take 1 tablet (50 mg total) by mouth daily with breakfast. 2 tablet 0   acyclovir (ZOVIRAX) 400 MG tablet Take 1 tablet (400 mg total) by mouth daily. 30 tablet 4   apixaban (ELIQUIS) 5 MG TABS tablet TAKE 1 TABLET TWICE A DAY 180 tablet 1   Ascorbic Acid (VITAMIN C) 1000 MG tablet Take 1,000 mg by mouth daily.     brimonidine (ALPHAGAN) 0.2 % ophthalmic solution Place 1 drop into both eyes 2 (two) times daily.      cholecalciferol (VITAMIN D3) 25 MCG (1000 UNIT) tablet Take 1,000 Units by mouth daily.     finasteride (PROSCAR) 5 MG tablet Take 1 tablet (5 mg total) by mouth daily. 90 tablet 3   lenalidomide (REVLIMID) 20 MG capsule Take 1 capsule (20 mg total) by mouth daily. Take for 21 days on, 7 days off,  repeat every 28 days. 21 capsule 0   Lifitegrast (XIIDRA) 5 % SOLN Apply 1 drop to eye 2 (two) times daily.     LUMIGAN 0.01 % SOLN Place 1 drop into both eyes at bedtime.     meclizine (ANTIVERT) 25 MG tablet Take 1 tablet (25 mg total) by mouth 3 (three) times daily as needed for dizziness. 30 tablet 0   metoprolol succinate (TOPROL-XL) 25 MG 24 hr tablet Take 0.5 tablets (12.5 mg total) by mouth 2 (two) times daily. 90 tablet 3   mometasone (ELOCON) 0.1 % cream Apply 1 Application topically 2 (two) times daily as needed (eczema).     Multiple Vitamin (MULTIVITAMIN WITH MINERALS) TABS tablet Take 1 tablet by mouth daily.      omeprazole (PRILOSEC OTC) 20 MG tablet Take 20 mg by mouth every other day.     ondansetron (ZOFRAN) 8 MG tablet Take 1 tablet (8 mg total) by mouth  every 8 (eight) hours as needed for nausea or vomiting. 30 tablet 1   Polyethyl Glycol-Propyl Glycol (SYSTANE OP) Place 1 drop into both eyes daily as needed (dry eyes).  Probiotic Product (SUPERIOR PROBIOTIC) CAPS Take 1 capsule by mouth daily.     prochlorperazine (COMPAZINE) 10 MG tablet Take 1 tablet (10 mg total) by mouth every 6 (six) hours as needed for nausea or vomiting. 30 tablet 1   rosuvastatin (CRESTOR) 20 MG tablet Take 1 tablet (20 mg total) by mouth daily. 90 tablet 3   silodosin (RAPAFLO) 8 MG CAPS capsule Take 1 capsule (8 mg total) by mouth daily with breakfast. 90 capsule 3   No current facility-administered medications for this visit.    SUMMARY OF ONCOLOGIC HISTORY: Oncology History Overview Note  Low grade B-cell lymphoma   Primary site: Lymphoid Neoplasms   Staging method: AJCC 6th Edition   Clinical: Stage IV signed by [name], MD on 09/26/2013  9:15 AM   Summary: Stage IV      Follicular low grade B-cell lymphoma ([place])  12/10/2003 Surgery   Inguinal lymph node biopsy showed follicular lymphoma.   12/12/2003 - 06/01/2004 Chemotherapy   He was treated with R. CHOP chemotherapy which show complete remission. The number of cycles of R. CHOP chemotherapy was unknown.   12/19/2006 Surgery   Lung resection show follicular lymphoma.   01/02/2007 - 09/01/2008 Chemotherapy   The patient was treated with single agent rituximab alone.   01/19/2007 Bone Marrow Biopsy   Bone marrow biopsy was negative.   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   05/08/2013 - 05/11/2013 Hospital Admission   The patient was admitted to the hospital for management of pericarditis. CT scan showed extensive lymphadenopathy.   06/07/2013 Imaging   PET/CT scan showed extensive lymphadenopathy   06/25/2013 Procedure   He has placement of Infuse-a-Port.   06/28/2013 Bone Marrow Biopsy   Bone marrow biopsy is positive for lymphoma involvement.    07/04/2013 - 11/22/2013 Chemotherapy   He is treated with 6 cycles of bendamustine with rituximab.   09/24/2013 Imaging   Repeat PET scan show complete remission.   12/26/2013 Imaging   PEt scan showed complete remission   09/26/2014 Imaging   CT scan of the chest abdomen and pelvis show no evidence of disease   03/26/2015 Imaging   CT scan showed no evidence of lymphoma   04/14/2016 Imaging   CT: Borderline prominent right hilar and subcarinal lymph nodes, but not appreciably changed. 2. Low-grade but increased central mesenteric stranding with some small mesenteric lymph nodes. This could certainly be inflammatory, 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, much of which is likely a physiologic response to cholecystectomy. 7. Sigmoid colon diverticulosis. 8.  Prominent stool throughout the colon favors constipation. 9. Enlarged prostate gland, volume estimated at 75 cubic cm.   02/15/2017 Imaging   No evidence of recurrent lymphoma or other acute findings.  Stable moderate hiatal hernia.  Colonic diverticulosis, without radiographic evidence of diverticulitis.  Stable mildly enlarged prostate.  Mild emphysema.  Aortic and coronary artery atherosclerosis.   01/31/2019 Imaging   1. Interval development of abdominal and pelvic adenopathy compatible with recurrent lymphoma. 2. Emphysema and aortic atherosclerosis. 3. Multi vessel coronary artery calcifications. 4. Moderate to large hiatal hernia   02/09/2019 Procedure   Successful CT-guided core biopsy of the left retroperitoneal periaortic adenopathy   02/09/2019 Pathology  Results   SURGICAL PATHOLOGY  CASE: [id number]   PATIENT: [name]  Surgical Pathology Report   Clinical History: Lymphoma; [name], LEFT PARA AORTIC, [name], cleaved nuclei and scant cytoplasm.  By immunohistochemistry, [name], [name], [name], [name] is low (less than 10%).  Flow cytometry was attempted; however, there was insufficient material for analysis (see [id number]).  Overall, the features are consistent with relapse of the patient's previously diagnosed follicular lymphoma.  Based on the biopsy, this is favored to be a low-grade follicular lymphoma   [id number] -  Chemotherapy   The patient had idelisib for chemotherapy treatment.     05/17/2019 Imaging   1. Interval generalized decrease in abdominopelvic lymphadenopathy identified on the previous study. No new or progressive lymphadenopathy on today's study. 2. Moderate hiatal hernia. 3.  Emphysema (ICD10-J43.9) and Aortic Atherosclerosis (ICD10-170.0)   11/16/2019 Imaging   IMPRESSION: Chest Impression:   1. No mediastinal lymphadenopathy. 2. Post RIGHT lung wedge resection without evidence local recurrence.   Abdomen / Pelvis Impression:   1. Stable numerous small periaortic retroperitoneal nodes and common iliac nodes. No progression of adenopathy. 2. No splenomegaly.  No skeletal metastasis.     07/31/2020 Imaging   1. Stable examination. No significant interval change in the prominent/mildly enlarged lymph nodes below the  diaphragm. No thoracic adenopathy. No splenomegaly. 2. Postsurgical change of right lung wedge resection without evidence of local recurrence. 3. Large hiatal hernia. 4. Colonic diverticulosis without findings of acute diverticulitis. 5. Aortic Atherosclerosis (ICD10-I70.0) and Emphysema (ICD10-J43.9).     01/29/2021 Imaging   1. Overall mild interval enlargement of lymph nodes in the abdomen and pelvis as described. 2. No lymphadenopathy identified in the chest. 3. Emphysematous changes of the lungs. 4. Large hiatal hernia.  Extensive colonic diverticulosis. 5. Other ancillary findings as described.     04/30/2021 Imaging   Chest Impression:   1. No mediastinal lymphadenopathy. 2. Large hiatal hernia. 3. Retrocrural node slightly decreased in size.   Abdomen / Pelvis Impression:   1. Cluster of numerous small retroperitoneal periaortic lymph nodes. Lymph nodes are decreased slightly in size compared to recent CT scan. 2. Likewise reduction in LEFT external iliac small lymph node. 3. No evidence of progression of lymphoma. 4. Normal spleen. 5. No bone involvement.   10/28/2021 Imaging   1. Overall trend towards progressive disease on today's exam. Lymph nodes in the hepatoduodenal ligament are upper normal for size but stable in the interval. Index retroperitoneal lymph nodes are progressive, notably in the right common iliac chain. Soft tissue nodule/lymph node in the retroperitoneal fat adjacent to the right iliacus muscle is progressive. 2. Large hiatal hernia. 3. Colonic diverticulosis without diverticulitis. 4. Prostatomegaly. 5. Aortic Atherosclerosis (ICD10-I70.0) and Emphysema (ICD10-J43.9).   03/10/2022 PET scan   Multiple intensely avid and enlarged retroperitoneal lymph nodes consistent with patient's known history of lymphoma. Of note, some of these lymph nodes demonstrate interval growth when compared to most recent abdominal CT,  for reference to the node adjacent to  the right psoas musculature. For purposes of assessing transformation into a more aggressive form of lymphoma, SUV max calculated using total body weight is 10.5 for the largest right iliac node and 9.8 for the soft tissue implant posterior to the right psoas muscle.  - Similar focus of increased hypermetabolic to be within the right hilar region, which may represent mediastinal involvement versus reactive etiology. Attention on follow-up examinations.    03/24/2022 Procedure   PROCEDURE: The patient was placed in the prone position. Initial CT images of the pelvis were obtained, demonstrating enlarged pelvic lymph node.   An appropriate entry site was identified and marked on the skin. The right back was prepped and draped using all elements of maximal sterile barrier technique.  Local anesthesia was achieved with lidocaine.   Under intermittent CT guidance, a 18-G coaxial needle was inserted into right pelvic lymph node.  A total of 3 cores were obtained through the cannula using spring-loaded 18-G Corvocet biopsy device.    03/24/2022 Pathology Results   A: Lymph node, pelvis, right, biopsy -  Involved by follicular lymphoma (see Comment)   Special Stains: CD5, [name] and CD20 are performed by immunohistochemistry and/or in situ hybridization in addition to flow cytometry for histopathologic and immunophenotypic correlation.   Appropriate controls for each stain have been evaluated and stain as expected.   Sections of block A1 are stained.   CD3: CD3 stain is negative in neoplastic lymphocytes. CD20: CD20 stains neoplastic lymphocytes. CD21: 0000000 stains follicular dendritic cells of enlarge neoplastic follicles. CD5: CD5 mirrors CD3 [name]: [name] stains weakly neoplastic lymphocytes. Bcl-6: Bcl-6 stains neoplastic lymphocytes. Bcl-2: Bcl-2 stains neoplastic lymphocytes.  Cyclin-D1: Cyclin-D1 stains is negative in neoplastic lymphocytes. Ki-67: Ki-67 shows low proliferation rate in neoplastic  lymphocytes (10%). GMS: GMS stain is negative for microorganisms AFB: AFB stain is negative for microorganisms   04/08/2022 -  Chemotherapy   Patient is on Treatment Plan : LYMPHOMA Lenalidomide + Rituximab q28d       PHYSICAL EXAMINATION: ECOG PERFORMANCE STATUS: 0 - Asymptomatic  Vitals:   03/30/22 1353  BP: 118/66  Pulse: (!) 54  Resp: 18  Temp: 97.7 F (36.5 C)  SpO2: 94%   Filed Weights   03/30/22 1353  Weight: 190 lb 3.2 oz (86.3 kg)    GENERAL:alert, no distress and comfortable  NEURO: alert & oriented x 3 with fluent speech, no focal motor/sensory deficits  LABORATORY DATA:  I have reviewed the data as listed    Component Value Date/Time   NA 139 01/28/2022 0822   NA 139 04/14/2016 0943   K 4.4 01/28/2022 0822   K 4.6 04/14/2016 0943   CL 102 01/28/2022 0822   CL 104 03/24/2012 1024   CO2 24 01/28/2022 0822   CO2 25 04/14/2016 0943   GLUCOSE 115 (H) 01/28/2022 0822   GLUCOSE 142 (H) 10/27/2021 0749   GLUCOSE 111 04/14/2016 0943   GLUCOSE 80 03/24/2012 1024   BUN 12 01/28/2022 0822   BUN 8.6 04/14/2016 0943   CREATININE 1.12 01/28/2022 0822   CREATININE 1.00 10/31/2020 1004   CREATININE 1.1 04/14/2016 0943   CALCIUM 9.4 01/28/2022 0822   CALCIUM 9.4 04/14/2016 0943   PROT 6.9 10/27/2021 0749   PROT 6.4 10/05/2021 1016   PROT 6.5 04/14/2016 0943   ALBUMIN 4.4 10/27/2021 0749   ALBUMIN 4.4 10/05/2021 1016   ALBUMIN 3.8 04/14/2016 0943   AST 17 10/27/2021 0749   AST 23  10/31/2020 1004   AST 28 04/14/2016 0943   ALT 15 10/27/2021 0749   ALT 20 10/31/2020 1004   ALT 41 04/14/2016 0943   ALKPHOS 64 10/27/2021 0749   ALKPHOS 71 04/14/2016 0943   BILITOT 0.5 10/27/2021 0749   BILITOT 0.6 10/05/2021 1016   BILITOT 0.6 10/31/2020 1004   BILITOT 0.61 04/14/2016 0943   GFRNONAA >60 10/27/2021 0749   GFRNONAA >60 10/31/2020 1004   GFRAA 69 09/27/2019 0801    No results found for: "SPEP", "UPEP"  Lab Results  Component Value Date   WBC 7.9  03/30/2022   NEUTROABS 4.9 03/30/2022   HGB 15.6 03/30/2022   HCT 45.5 03/30/2022   MCV 87.2 03/30/2022   PLT 192 03/30/2022      Chemistry      Component Value Date/Time   NA 139 01/28/2022 0822   NA 139 04/14/2016 0943   K 4.4 01/28/2022 0822   K 4.6 04/14/2016 0943   CL 102 01/28/2022 0822   CL 104 03/24/2012 1024   CO2 24 01/28/2022 0822   CO2 25 04/14/2016 0943   BUN 12 01/28/2022 0822   BUN 8.6 04/14/2016 0943   CREATININE 1.12 01/28/2022 0822   CREATININE 1.00 10/31/2020 1004   CREATININE 1.1 04/14/2016 0943      Component Value Date/Time   CALCIUM 9.4 01/28/2022 0822   CALCIUM 9.4 04/14/2016 0943   ALKPHOS 64 10/27/2021 0749   ALKPHOS 71 04/14/2016 0943   AST 17 10/27/2021 0749   AST 23 10/31/2020 1004   AST 28 04/14/2016 0943   ALT 15 10/27/2021 0749   ALT 20 10/31/2020 1004   ALT 41 04/14/2016 0943   BILITOT 0.5 10/27/2021 0749   BILITOT 0.6 10/05/2021 1016   BILITOT 0.6 10/31/2020 1004   BILITOT 0.61 04/14/2016 0943

## 2022-03-30 NOTE — Telephone Encounter (Signed)
Oral Oncology Patient Advocate Encounter   Received notification that prior authorization for lenalidomide is required.   PA submitted on 03/30/22 Key [id number] Status is pending     Lady Deutscher, CPhT-Adv Oncology Pharmacy Patient [place] Direct Number: [phone]  Fax: [phone]

## 2022-03-30 NOTE — Telephone Encounter (Signed)
Oral Oncology Patient Advocate Encounter  Prior Authorization for Lenalidomide(Revlimid) has been approved.    PA# [id number] Effective dates: 03/30/22 through 01/10/2098  Patients co-pay is $16.  Patient must fill at [place], [place] Patient [place] Direct Number: [phone]  Fax: [phone]

## 2022-03-30 NOTE — Progress Notes (Signed)
START OFF PATHWAY REGIMEN - Lymphoma and CLL   [id number]:Lenalidomide 20 mg PO Daily D1-21 + Rituximab 375 mg/m2 IV [id number] Cycle 1 then D1 q28 Days:   A cycle is every 28 days:     Lenalidomide      Rituximab-xxxx      Rituximab-xxxx   **Always confirm dose/schedule in your pharmacy ordering system**  Patient Characteristics: Follicular Lymphoma, Grades 1, 2, and 3A, Third Line, Candidate for CAR T-Cell Therapy, Refer to Specialist Disease Type: Follicular Lymphoma, Grade 1, 2, or 3A Disease Type: Not Applicable Disease Type: Not Applicable Line of Therapy: Third Line Patient Characteristics: Candidate for CAR T-Cell Therapy Please indicate whether you are: Referring to a specialist Intent of Therapy: Non-Curative / Palliative Intent, Discussed with Patient

## 2022-03-31 ENCOUNTER — Telehealth: Payer: Self-pay

## 2022-03-31 ENCOUNTER — Other Ambulatory Visit: Payer: Self-pay

## 2022-03-31 DIAGNOSIS — C8280 Other types of follicular lymphoma, unspecified site: Secondary | ICD-10-CM

## 2022-03-31 MED ORDER — LENALIDOMIDE 20 MG PO CAPS: 20.0000 mg | ORAL_CAPSULE | Freq: Every day | ORAL | 0 refills | Status: DC

## 2022-03-31 MED ORDER — ACYCLOVIR 400 MG PO TABS: 400.0000 mg | ORAL_TABLET | Freq: Every day | ORAL | 1 refills | Status: DC

## 2022-03-31 NOTE — Telephone Encounter (Signed)
Received call from Anthony Kelly wife requesting we send rx for Acyclovir to [facility] in [place]. She states [facility] would not be able to deliver quickly enough.  Contacted [facility] to cancel Rx and refill sent to [facility].

## 2022-03-31 NOTE — Telephone Encounter (Signed)
Oral Oncology Pharmacist Encounter  Revlimid prescription forwarded to [place] with [employer] authorization number.   Drema Halon, PharmD Hematology/Oncology Clinical Pharmacist [facility] [phone]

## 2022-03-31 NOTE — Progress Notes (Signed)
Pharmacist Chemotherapy Monitoring - Initial Assessment    Anticipated start date: 04/08/22   The following has been reviewed per standard work regarding the patient's treatment regimen: The patient's diagnosis, treatment plan and drug doses, and organ/hematologic function Lab orders and baseline tests specific to treatment regimen  The treatment plan start date, drug sequencing, and pre-medications Prior authorization status  Patient's documented medication list, including drug-drug interaction screen and prescriptions for anti-emetics and supportive care specific to the treatment regimen The drug concentrations, fluid compatibility, administration routes, and timing of the medications to be used The patient's access for treatment and lifetime cumulative dose history, if applicable  The patient's medication allergies and previous infusion related reactions, if applicable   Changes made to treatment plan:  Not eligible for rapid RTX - h/o reaction in 2015 to Rituxan.  Follow up needed:  MD to determine if additional premeds needed. PA pending   Kennith Center, Pharm.D., CPP 03/31/2022@4 :45 PM

## 2022-03-31 NOTE — Telephone Encounter (Signed)
-----   Message from Rickard Patience, RN sent at 03/31/2022  8:55 AM EDT ----- Regarding: FW: [facility]  ----- Message ----- From: [name], MD Sent: 03/31/2022   8:17 AM EDT To: Flo Shanks, RN; Rickard Patience, RN Subject: FW: [facility]                            Pls proceed to fill He will unlikely need to take it No other alternatives ----- Message ----- From: Flo Shanks, RN Sent: 03/30/2022   4:14 PM EDT To: [name], MD Subject: [facility]                                Hi! Pharmacist called and left a message. They received Zofran and compazine Rx. 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 Ok to fill? Change Rx? Call back [phone], reference # [id number]. Thanks

## 2022-03-31 NOTE — Telephone Encounter (Signed)
Advised [facility] scripts per MD ok to proceed.

## 2022-03-31 NOTE — Telephone Encounter (Signed)
Oral Oncology Student Pharmacist Encounter  Received new prescription for Revlimid for the treatment of Follicular low grade B-cell lymphoma in conjunction with rituximab, planned duration until disease progression or unacceptable toxicity.  Labs from 03/30/2022 assessed, no interventions needed.  Pt's calculated CrCL is 77 mL/min.  Prescription dose and frequency assessed for appropriateness.  Current medication list in Epic reviewed, no DDIs with Revlimid identified.  Evaluated chart and no patient barriers to medication adherence noted.   Patient agreement for treatment documented in MD note on 03/30/2022.  Prescription has been e-scribed to the [facility] for benefits analysis and approval.  Oral Oncology Clinic will continue to follow for insurance authorization, copayment issues, initial counseling and start date.  Donney Rankins, PharmD Candidate 03/31/2022 10:03 AM Oral Oncology Clinic [phone]

## 2022-04-05 ENCOUNTER — Encounter: Payer: Self-pay | Admitting: Family Medicine

## 2022-04-05 ENCOUNTER — Ambulatory Visit (INDEPENDENT_AMBULATORY_CARE_PROVIDER_SITE_OTHER): Payer: Medicare Other | Admitting: Family Medicine

## 2022-04-05 ENCOUNTER — Ambulatory Visit (INDEPENDENT_AMBULATORY_CARE_PROVIDER_SITE_OTHER): Payer: Medicare Other

## 2022-04-05 VITALS — BP 107/74 | HR 78 | Ht 72.0 in | Wt 189.0 lb

## 2022-04-05 DIAGNOSIS — M8589 Other specified disorders of bone density and structure, multiple sites: Secondary | ICD-10-CM | POA: Diagnosis not present

## 2022-04-05 DIAGNOSIS — C8280 Other types of follicular lymphoma, unspecified site: Secondary | ICD-10-CM | POA: Diagnosis not present

## 2022-04-05 DIAGNOSIS — I480 Paroxysmal atrial fibrillation: Secondary | ICD-10-CM | POA: Diagnosis not present

## 2022-04-05 DIAGNOSIS — R350 Frequency of micturition: Secondary | ICD-10-CM

## 2022-04-05 DIAGNOSIS — N401 Enlarged prostate with lower urinary tract symptoms: Secondary | ICD-10-CM | POA: Diagnosis not present

## 2022-04-05 DIAGNOSIS — R937 Abnormal findings on diagnostic imaging of other parts of musculoskeletal system: Secondary | ICD-10-CM

## 2022-04-05 DIAGNOSIS — M8580 Other specified disorders of bone density and structure, unspecified site: Secondary | ICD-10-CM

## 2022-04-05 DIAGNOSIS — R7303 Prediabetes: Secondary | ICD-10-CM

## 2022-04-05 DIAGNOSIS — E782 Mixed hyperlipidemia: Secondary | ICD-10-CM

## 2022-04-05 DIAGNOSIS — M8588 Other specified disorders of bone density and structure, other site: Secondary | ICD-10-CM

## 2022-04-05 DIAGNOSIS — Z01818 Encounter for other preprocedural examination: Secondary | ICD-10-CM

## 2022-04-05 LAB — BAYER DCA HB A1C WAIVED: HB A1C (BAYER DCA - WAIVED): 6.2 % — ABNORMAL HIGH (ref 4.8–5.6)

## 2022-04-05 NOTE — Progress Notes (Signed)
BP 107/74   Pulse 78   Ht 6' (1.829 m)   Wt 189 lb (85.7 kg)   SpO2 98%   BMI 25.63 kg/m    Subjective:   Patient ID: Anthony Kelly, male    DOB: 1945-03-27, 77 y.o.   MRN: [id number]  HPI: Anthony Kelly is a 77 y.o. male presenting on 04/05/2022 for Medical Management of Chronic Issues, Hyperlipidemia, and Prediabetes   HPI Prediabetes Patient comes in today for recheck of his diabetes. Patient has been currently taking Crestor. Patient is not currently on an ACE inhibitor/ARB. Patient has not seen an ophthalmologist this year. Patient denies any issues with their feet. The symptom started onset as an adult hyperlipidemia ARE RELATED TO DM   Hyperlipidemia Patient is coming in for recheck of his hyperlipidemia. The patient is currently taking Crestor. They deny any issues with myalgias or history of liver damage from it. They deny any focal numbness or weakness or chest pain.   A-fib recheck. Patient is coming in today for A-fib recheck.  Currently takes Eliquis and metoprolol and sees Dr. Harl Bowie his cardiologist.  Patient has BPH and sees urology Dr. Alyson Ingles.  Patient has follicular low-grade B-cell lymphoma and follows up with hematology for this.  Relevant past medical, surgical, family and social history reviewed and updated as indicated. Interim medical history since our last visit reviewed. Allergies and medications reviewed and updated.  Review of Systems  Constitutional:  Negative for chills and fever.  Eyes:  Negative for visual disturbance.  Respiratory:  Negative for shortness of breath and wheezing.   Cardiovascular:  Negative for chest pain and leg swelling.  Musculoskeletal:  Negative for back pain and gait problem.  Skin:  Negative for rash.  Neurological:  Negative for dizziness, weakness and light-headedness.  All other systems reviewed and are negative.   Per HPI unless specifically indicated above   Allergies as of 04/05/2022   No Known Allergies       Medication List        Accurate as of April 05, 2022 10:57 AM. If you have any questions, ask your nurse or doctor.          STOP taking these medications    predniSONE 50 MG tablet Commonly known as: DELTASONE Stopped by: [name] Mikhi Athey, MD       TAKE these medications    acyclovir 400 MG tablet Commonly known as: ZOVIRAX Take 1 tablet (400 mg total) by mouth daily.   allopurinol 300 MG tablet Commonly known as: ZYLOPRIM Take 1 tablet (300 mg total) by mouth daily.   brimonidine 0.2 % ophthalmic solution Commonly known as: ALPHAGAN Place 1 drop into both eyes 2 (two) times daily.   cholecalciferol 25 MCG (1000 UNIT) tablet Commonly known as: VITAMIN D3 Take 1,000 Units by mouth daily.   Eliquis 5 MG Tabs tablet Generic drug: apixaban TAKE 1 TABLET TWICE A DAY   finasteride 5 MG tablet Commonly known as: PROSCAR Take 1 tablet (5 mg total) by mouth daily.   lenalidomide 20 MG capsule Commonly known as: Revlimid Take 1 capsule (20 mg total) by mouth daily. Take for 21 days on, 7 days off,  repeat every 28 days.   bimatoprost 0.03 % ophthalmic solution Commonly known as: LUMIGAN 1 drop at bedtime.   Lumigan 0.01 % Soln Generic drug: bimatoprost Place 1 drop into both eyes at bedtime.   meclizine 25 MG tablet Commonly known as: ANTIVERT Take 1 tablet (25 mg total)  by mouth 3 (three) times daily as needed for dizziness.   metoprolol succinate 25 MG 24 hr tablet Commonly known as: TOPROL-XL Take 0.5 tablets (12.5 mg total) by mouth 2 (two) times daily.   mometasone 0.1 % cream Commonly known as: ELOCON Apply 1 Application topically 2 (two) times daily as needed (eczema).   multivitamin with minerals Tabs tablet Take 1 tablet by mouth daily.   omeprazole 20 MG tablet Commonly known as: PRILOSEC OTC Take 20 mg by mouth every other day.   ondansetron 8 MG tablet Commonly known as: Zofran Take 1 tablet (8 mg total) by mouth every 8  (eight) hours as needed for nausea or vomiting.   prochlorperazine 10 MG tablet Commonly known as: COMPAZINE Take 1 tablet (10 mg total) by mouth every 6 (six) hours as needed for nausea or vomiting.   rosuvastatin 20 MG tablet Commonly known as: CRESTOR Take 1 tablet (20 mg total) by mouth daily.   silodosin 8 MG Caps capsule Commonly known as: RAPAFLO Take 1 capsule (8 mg total) by mouth daily with breakfast.   Superior Probiotic Caps Take 1 capsule by mouth daily.   SYSTANE OP Place 1 drop into both eyes daily as needed (dry eyes).   vitamin C 1000 MG tablet Take 1,000 mg by mouth daily.   Xiidra 5 % Soln Generic drug: Lifitegrast Apply 1 drop to eye 2 (two) times daily.         Objective:   BP 107/74   Pulse 78   Ht 6' (1.829 m)   Wt 189 lb (85.7 kg)   SpO2 98%   BMI 25.63 kg/m   Wt Readings from Last 3 Encounters:  04/05/22 189 lb (85.7 kg)  03/30/22 190 lb 3.2 oz (86.3 kg)  03/17/22 191 lb 9.6 oz (86.9 kg)    Physical Exam Vitals and nursing note reviewed.  Constitutional:      General: He is not in acute distress.    Appearance: He is well-developed. He is not diaphoretic.  Eyes:     General: No scleral icterus.    Conjunctiva/sclera: Conjunctivae normal.  Neck:     Thyroid: No thyromegaly.  Cardiovascular:     Rate and Rhythm: Normal rate and regular rhythm.     Heart sounds: Normal heart sounds. No murmur heard. Pulmonary:     Effort: Pulmonary effort is normal. No respiratory distress.     Breath sounds: Normal breath sounds. No wheezing.  Musculoskeletal:        General: No swelling. Normal range of motion.     Cervical back: Neck supple.  Lymphadenopathy:     Cervical: No cervical adenopathy.  Skin:    General: Skin is warm and dry.     Findings: No rash.  Neurological:     Mental Status: He is alert and oriented to person, place, and time.     Coordination: Coordination normal.  Psychiatric:        Behavior: Behavior normal.        Assessment & Plan:   Problem List Items Addressed This Visit       Cardiovascular and Mediastinum   PAF (paroxysmal atrial fibrillation) (HCC) (Chronic)     Musculoskeletal and Integument   Osteopenia   Relevant Orders   DG [facility] DEXA (Completed)     Genitourinary   Benign prostate hyperplasia     Other   Hyperlipidemia - Primary   Relevant Orders   Lipid panel (Completed)   Prediabetes  Relevant Orders   Bayer DCA Hb A1c Waived (Completed)   Microalbumin / creatinine urine ratio (Completed)   Follicular low grade B-cell lymphoma   Other Visit Diagnoses     Abnormal bone density screening       Relevant Orders   DG [facility] DEXA (Completed)   Examination prior to chemotherapy       Relevant Orders   DG [facility] DEXA (Completed)     Had some labs done with his oncology, will do bone density scan today in the urine and will also check an A1c and a lipid today.  Will do bone density scan and urinalysis and other blood work today that has not been done with his oncology. Follow up plan: Return in about 6 months (around 10/06/2022), or if symptoms worsen or fail to improve, for Prediabetes and hyperlipidemia.  Counseling provided for all of the vaccine components Orders Placed This Encounter  Procedures   DG [facility] DEXA   Bayer DCA Hb A1c Waived   Lipid panel   Microalbumin / creatinine urine ratio    Caryl Pina, MD [facility] 04/05/2022, 10:57 AM

## 2022-04-06 LAB — LIPID PANEL
Chol/HDL Ratio: 2.1 ratio (ref 0.0–5.0)
Cholesterol, Total: 98 mg/dL — ABNORMAL LOW (ref 100–199)
HDL: 46 mg/dL (ref >39)
LDL Chol Calc (NIH): 30 mg/dL (ref 0–99)
Triglycerides: 124 mg/dL (ref 0–149)
VLDL Cholesterol Cal: 22 mg/dL (ref 5–40)

## 2022-04-06 LAB — MICROALBUMIN / CREATININE URINE RATIO
Creatinine, Urine: 68.7 mg/dL
Microalb/Creat Ratio: <4 mg/g creat (ref 0–29)
Microalbumin, Urine: <3 ug/mL

## 2022-04-07 DIAGNOSIS — C8290 Follicular lymphoma, unspecified, unspecified site: Secondary | ICD-10-CM | POA: Diagnosis not present

## 2022-04-08 ENCOUNTER — Inpatient Hospital Stay: Payer: Medicare Other

## 2022-04-08 ENCOUNTER — Encounter: Payer: Self-pay | Admitting: Hematology and Oncology

## 2022-04-08 ENCOUNTER — Other Ambulatory Visit: Payer: Self-pay

## 2022-04-08 VITALS — BP 108/63 | HR 68 | Temp 97.6°F | Resp 16 | Wt 190.8 lb

## 2022-04-08 DIAGNOSIS — Z7901 Long term (current) use of anticoagulants: Secondary | ICD-10-CM | POA: Diagnosis not present

## 2022-04-08 DIAGNOSIS — C8288 Other types of follicular lymphoma, lymph nodes of multiple sites: Secondary | ICD-10-CM | POA: Diagnosis not present

## 2022-04-08 DIAGNOSIS — Z5112 Encounter for antineoplastic immunotherapy: Secondary | ICD-10-CM | POA: Diagnosis not present

## 2022-04-08 DIAGNOSIS — C8280 Other types of follicular lymphoma, unspecified site: Secondary | ICD-10-CM

## 2022-04-08 MED ORDER — DIPHENHYDRAMINE HCL 25 MG PO CAPS
25.0000 mg | ORAL_CAPSULE | Freq: Once | ORAL | Status: AC
  Administered 2022-04-08: 25 mg via ORAL
  Filled 2022-04-08: qty 1

## 2022-04-08 MED ORDER — MONTELUKAST SODIUM 10 MG PO TABS
10.0000 mg | ORAL_TABLET | Freq: Once | ORAL | Status: AC
  Administered 2022-04-08: 10 mg via ORAL
  Filled 2022-04-08: qty 1

## 2022-04-08 MED ORDER — SODIUM CHLORIDE 0.9 % IV SOLN: Freq: Once | INTRAVENOUS | Status: AC

## 2022-04-08 MED ORDER — FAMOTIDINE IN NACL 20-0.9 MG/50ML-% IV SOLN
20.0000 mg | Freq: Once | INTRAVENOUS | Status: AC
  Administered 2022-04-08: 20 mg via INTRAVENOUS
  Filled 2022-04-08: qty 50

## 2022-04-08 MED ORDER — ACETAMINOPHEN 325 MG PO TABS
650.0000 mg | ORAL_TABLET | Freq: Once | ORAL | Status: AC
  Administered 2022-04-08: 650 mg via ORAL
  Filled 2022-04-08: qty 2

## 2022-04-08 MED ORDER — SODIUM CHLORIDE 0.9 % IV SOLN
375.0000 mg/m2 | Freq: Once | INTRAVENOUS | Status: AC
  Administered 2022-04-08: 800 mg via INTRAVENOUS
  Filled 2022-04-08: qty 50

## 2022-04-08 NOTE — Patient Instructions (Signed)
[facility]  Discharge Instructions: Thank you for choosing [facility] to provide your oncology and hematology care.   If you have a lab appointment with the Cancer Center, please go directly to the Cancer Center and check in at the registration area.   Wear comfortable clothing and clothing appropriate for easy access to any Portacath or PICC line.   We strive to give you quality time with your provider. You may need to reschedule your appointment if you arrive late (15 or more minutes).  Arriving late affects you and other patients whose appointments are after yours.  Also, if you miss three or more appointments without notifying the office, you may be dismissed from the clinic at the provider's discretion.      For prescription refill requests, have your pharmacy contact our office and allow 72 hours for refills to be completed.    Today you received the following chemotherapy and/or immunotherapy agents Rituxan     To help prevent nausea and vomiting after your treatment, we encourage you to take your nausea medication as directed.  BELOW ARE SYMPTOMS THAT SHOULD BE REPORTED IMMEDIATELY: *FEVER GREATER THAN 100.4 F (38 C) OR HIGHER *CHILLS OR SWEATING *NAUSEA AND VOMITING THAT IS NOT CONTROLLED WITH YOUR NAUSEA MEDICATION *UNUSUAL SHORTNESS OF BREATH *UNUSUAL BRUISING OR BLEEDING *URINARY PROBLEMS (pain or burning when urinating, or frequent urination) *BOWEL PROBLEMS (unusual diarrhea, constipation, pain near the anus) TENDERNESS IN MOUTH AND THROAT WITH OR WITHOUT PRESENCE OF ULCERS (sore throat, sores in mouth, or a toothache) UNUSUAL RASH, SWELLING OR PAIN  UNUSUAL VAGINAL DISCHARGE OR ITCHING   Items with * indicate a potential emergency and should be followed up as soon as possible or go to the Emergency Department if any problems should occur.  Please show the CHEMOTHERAPY ALERT CARD or IMMUNOTHERAPY ALERT CARD at check-in  to the Emergency Department and triage nurse.  Should you have questions after your visit or need to cancel or reschedule your appointment, please contact [facility]  Dept: [phone]  and follow the prompts.  Office hours are 8:00 a.m. to 4:30 p.m. Monday - Friday. Please note that voicemails left after 4:00 p.m. may not be returned until the following business day.  We are closed weekends and major holidays. You have access to a nurse at all times for urgent questions. Please call the main number to the clinic Dept: [phone] and follow the prompts.   For any non-urgent questions, you may also contact your provider using MyChart. We now offer e-Visits for anyone 18 and older to request care online for non-urgent symptoms. For details visit [email or web address].   Also download the MyChart app! Go to the app store, search "MyChart", open the app, select [facility], and log in with your MyChart username and password.    Rituximab Injection What is this medication? RITUXIMAB (ri TUX i mab) treats leukemia and lymphoma. It works by blocking a protein that causes cancer cells to grow and multiply. This helps to slow or stop the spread of cancer cells. It may also be used to treat autoimmune conditions, such as arthritis. It works by slowing down an overactive immune system. It is a monoclonal antibody. This medicine may be used for other purposes; ask your health care provider or pharmacist if you have questions. COMMON BRAND NAME(S): RIABNI, Rituxan, RUXIENCE, truxima What should I tell my care team before I take this medication? They   need to know if you have any of these conditions: Chest pain Heart disease Immune system problems Infection, such as chickenpox, cold sores, hepatitis B, herpes Irregular heartbeat or rhythm Kidney disease Low blood counts, such as low white cells, platelets, red cells Lung disease Recent or upcoming vaccine An  unusual or allergic reaction to rituximab, other medications, foods, dyes, or preservatives Pregnant or trying to get pregnant Breast-feeding How should I use this medication? This medication is injected into a vein. It is given by a care team in a hospital or clinic setting. A special MedGuide will be given to you before each treatment. Be sure to read this information carefully each time. Talk to your care team about the use of this medication in children. While this medication may be prescribed for children as young as 6 months for selected conditions, precautions do apply. Overdosage: If you think you have taken too much of this medicine contact a poison control center or emergency room at once. NOTE: This medicine is only for you. Do not share this medicine with others. What if I miss a dose? Keep appointments for follow-up doses. It is important not to miss your dose. Call your care team if you are unable to keep an appointment. What may interact with this medication? Do not take this medication with any of the following: Live vaccines This medication may also interact with the following: Cisplatin This list may not describe all possible interactions. Give your health care provider a list of all the medicines, herbs, non-prescription drugs, or dietary supplements you use. Also tell them if you smoke, drink alcohol, or use illegal drugs. Some items may interact with your medicine. What should I watch for while using this medication? Your condition will be monitored carefully while you are receiving this medication. You may need blood work while taking this medication. This medication can cause serious infusion reactions. To reduce the risk your care team may give you other medications to take before receiving this one. Be sure to follow the directions from your care team. This medication may increase your risk of getting an infection. Call your care team for advice if you get a fever,  chills, sore throat, or other symptoms of a cold or flu. Do not treat yourself. Try to avoid being around people who are sick. Call your care team if you are around anyone with measles, chickenpox, or if you develop sores or blisters that do not heal properly. Avoid taking medications that contain aspirin, acetaminophen, ibuprofen, naproxen, or ketoprofen unless instructed by your care team. These medications may hide a fever. This medication may cause serious skin reactions. They can happen weeks to months after starting the medication. Contact your care team right away if you notice fevers or flu-like symptoms with a rash. The rash may be red or purple and then turn into blisters or peeling of the skin. You may also notice a red rash with swelling of the face, lips, or lymph nodes in your neck or under your arms. In some patients, this medication may cause a serious brain infection that may cause death. If you have any problems seeing, thinking, speaking, walking, or standing, tell your care team right away. If you cannot reach your care team, urgently seek another source of medical care. Talk to your care team if you may be pregnant. Serious birth defects can occur if you take this medication during pregnancy and for 12 months after the last dose. You will need a negative   pregnancy test before starting this medication. Contraception is recommended while taking this medication and for 12 months after the last dose. Your care team can help you find the option that works for you. Do not breastfeed while taking this medication and for at least 6 months after the last dose. What side effects may I notice from receiving this medication? Side effects that you should report to your care team as soon as possible: Allergic reactions or angioedema--skin rash, itching or hives, swelling of the face, eyes, lips, tongue, arms, or legs, trouble swallowing or breathing Bowel blockage--stomach cramping, unable to have a  bowel movement or pass gas, loss of appetite, vomiting Dizziness, loss of balance or coordination, confusion or trouble speaking Heart attack--pain or tightness in the chest, shoulders, arms, or jaw, nausea, shortness of breath, cold or clammy skin, feeling faint or lightheaded Heart rhythm changes--fast or irregular heartbeat, dizziness, feeling faint or lightheaded, chest pain, trouble breathing Infection--fever, chills, cough, sore throat, wounds that don't heal, pain or trouble when passing urine, general feeling of discomfort or being unwell Infusion reactions--chest pain, shortness of breath or trouble breathing, feeling faint or lightheaded Kidney injury--decrease in the amount of urine, swelling of the ankles, hands, or feet Liver injury--right upper belly pain, loss of appetite, nausea, light-colored stool, dark yellow or brown urine, yellowing skin or eyes, unusual weakness or fatigue Redness, blistering, peeling, or loosening of the skin, including inside the mouth Stomach pain that is severe, does not go away, or gets worse Tumor lysis syndrome (TLS)--nausea, vomiting, diarrhea, decrease in the amount of urine, dark urine, unusual weakness or fatigue, confusion, muscle pain or cramps, fast or irregular heartbeat, joint pain Side effects that usually do not require medical attention (report to your care team if they continue or are bothersome): Headache Joint pain Nausea Runny or stuffy nose Unusual weakness or fatigue This list may not describe all possible side effects. Call your doctor for medical advice about side effects. You may report side effects to FDA at 1-800-FDA-1088. Where should I keep my medication? This medication is given in a hospital or clinic. It will not be stored at home. NOTE: This sheet is a summary. It may not cover all possible information. If you have questions about this medicine, talk to your doctor, pharmacist, or health care provider.  2023  Elsevier/Gold Standard (2021-05-12 00:00:00)   

## 2022-04-09 ENCOUNTER — Encounter: Payer: Self-pay | Admitting: Family Medicine

## 2022-04-12 ENCOUNTER — Encounter: Payer: Self-pay | Admitting: Hematology and Oncology

## 2022-04-12 ENCOUNTER — Encounter: Payer: Self-pay | Admitting: Cardiovascular Disease

## 2022-04-12 ENCOUNTER — Telehealth: Payer: Self-pay

## 2022-04-12 ENCOUNTER — Telehealth: Payer: Self-pay | Admitting: Cardiovascular Disease

## 2022-04-12 NOTE — Telephone Encounter (Signed)
Returned call to wife. Anthony Kelly recently had Bone density and was instructed to start Calcium 1200 mg daily and vitamin D 800 IU daily for osteopenia. He is currently taking multivitamin daily. Wife is asking if he should continue multivitamin. Told her to continue multivitamin and start calcium/ vitamin D. She verbalized understanding.  Anthony Kelly had a episode of nausea yesterday with diarrhea. He had a greasy meal and then had episode. He took compazine and imodium and has had no further symptoms. Just FYI.  Anthony Kelly had a episode irregular HR over the weekend after getting Rituxan last week. She gave him extra metoprolol and this helped symptoms. She called cardiology today and is waiting on call back to get further instructions.  Revlimid will be delivered on Wednesday. Anthony Kelly will start on Thursday 4/4 when he comes to [facility] for treatment. Per wife, he will wait just in case he has a reaction.  Told wife that I would forward the above to Dr. Alvy Bimler and she is not in the office today.

## 2022-04-12 NOTE — Telephone Encounter (Signed)
Patient wife [name] [facility]) stated patient experienced irregular HR every time after receiving his cancer injections. Patient did not report symptoms to the oncologist.  She did give the patient an extra metoprolol and this did help the patient symptoms. Patient does not have recordings of blood pressure and HR. Patients wife wanted to know if its okay to give additional tablet when pt becomes symptomatic. Will forward to MD and nurse.

## 2022-04-12 NOTE — Telephone Encounter (Signed)
Pt c/o medication issue:  1. Name of Medication: RITUXAN (rituximab) injection  lenalidomide (REVLIMID) 20 MG capsule  metoprolol succinate (TOPROL-XL) 25 MG 24 hr tablet   2. How are you currently taking this medication (dosage and times per day)?   3. Are you having a reaction (difficulty breathing--STAT)?   4. What is your medication issue?  Pt wife states pt took his cancer injection last week and his hr was irregular. She states she gave him an extra metoprolol and she wants to know is it safe for pt to keep taking an extra one every time his hr is irregular. Please advise.   She also stated pt is to start revlimid as well.

## 2022-04-12 NOTE — Telephone Encounter (Signed)
See phone note from today.

## 2022-04-13 ENCOUNTER — Encounter: Payer: Self-pay | Admitting: Hematology and Oncology

## 2022-04-13 ENCOUNTER — Other Ambulatory Visit: Payer: Self-pay

## 2022-04-13 NOTE — Telephone Encounter (Signed)
Spoke with the patient's wife who states that his heart rate has been in the 60-70's. He did go into AFIB for a short period of time while they were out walking over the weekend. Denies any episodes of elevated heart rates. Wife states that the patient is about to start on a new cancer drug later this week and is concerned because it also has a side effect of irregular heart rhythm. She has continued to give his an extra 1/2 tablet of metoprolol just once per day which she states has helped. Advised that the metoprolol was to help lower heart rate. She verbalized understanding. Would like to know if there is something that he can take for his palpitations.

## 2022-04-13 NOTE — Telephone Encounter (Signed)
I agree we wait to start until I see him

## 2022-04-14 NOTE — Telephone Encounter (Signed)
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   Counseled patient on administration, dosing, side effects, monitoring, drug-food interactions, safe handling, storage, and disposal.  Patient will take Revlimid 20mg  capsules, 1 capsule by mouth once daily, without regard to food, with a full glass of water.  Revlimid will be given 14 days on, 7 days off, repeat every 21 days.  Revlimid start date: 04/15/2022   Adverse effects of Revlimid include but are not limited to: nausea, constipation, diarrhea, abdominal pain, rash, fatigue, drug fever, and decreased blood counts.    Reviewed with patient importance of keeping a medication schedule and plan for any missed doses. No barriers to medication adherence identified.  Medication reconciliation performed and medication/allergy list updated.  Patient has picked up acyclovir prescription and has already started the medication as patient started the rituximab on 04/08/22 Patient is already on Eliquis and will not need to take any baby aspirin. Patient and patients wife is aware.   Insurance authorization for Revlimid has been obtained.  Revlimid prescription is being dispensed from [facility] pharmacy as it is a limited distribution medication.  All questions answered.  Patient voiced understanding and appreciation.   Medication education handout placed in mail for patient. Patient knows to call the office with questions or concerns. Oral Chemotherapy Clinic phone number provided to patient.   Bethel Born, PharmD Hematology/Oncology Clinical Pharmacist [facility] [phone] 04/14/2022    1:15 PM

## 2022-04-14 NOTE — Telephone Encounter (Signed)
Spoke with the patient's wife and gave recommendations from Dr. Myles Gip. She verbalized understanding and will let us know if he becomes symptomatic.

## 2022-04-15 ENCOUNTER — Inpatient Hospital Stay: Payer: Medicare Other | Attending: Hematology and Oncology

## 2022-04-15 ENCOUNTER — Inpatient Hospital Stay: Payer: Medicare Other

## 2022-04-15 VITALS — BP 108/63 | HR 64 | Temp 97.7°F | Resp 16

## 2022-04-15 DIAGNOSIS — C8288 Other types of follicular lymphoma, lymph nodes of multiple sites: Secondary | ICD-10-CM | POA: Diagnosis not present

## 2022-04-15 DIAGNOSIS — C8280 Other types of follicular lymphoma, unspecified site: Secondary | ICD-10-CM

## 2022-04-15 DIAGNOSIS — Z5112 Encounter for antineoplastic immunotherapy: Secondary | ICD-10-CM | POA: Diagnosis not present

## 2022-04-15 DIAGNOSIS — L270 Generalized skin eruption due to drugs and medicaments taken internally: Secondary | ICD-10-CM | POA: Insufficient documentation

## 2022-04-15 LAB — CMP (CANCER CENTER ONLY)
ALT: 18 U/L (ref 0–44)
AST: 17 U/L (ref 15–41)
Albumin: 4.1 g/dL (ref 3.5–5.0)
Alkaline Phosphatase: 52 U/L (ref 38–126)
Anion gap: 7 (ref 5–15)
BUN: 11 mg/dL (ref 8–23)
CO2: 27 mmol/L (ref 22–32)
Calcium: 9.4 mg/dL (ref 8.9–10.3)
Chloride: 104 mmol/L (ref 98–111)
Creatinine: 1.01 mg/dL (ref 0.61–1.24)
GFR, Estimated: >60 mL/min (ref >60)
Glucose, Bld: 106 mg/dL — ABNORMAL HIGH (ref 70–99)
Potassium: 4.2 mmol/L (ref 3.5–5.1)
Sodium: 138 mmol/L (ref 135–145)
Total Bilirubin: 0.6 mg/dL (ref 0.3–1.2)
Total Protein: 6.4 g/dL — ABNORMAL LOW (ref 6.5–8.1)

## 2022-04-15 LAB — CBC WITH DIFFERENTIAL (CANCER CENTER ONLY)
Abs Immature Granulocytes: 0.02 10*3/uL (ref 0.00–0.07)
Basophils Absolute: 0.1 10*3/uL (ref 0.0–0.1)
Basophils Relative: 1 %
Eosinophils Absolute: 0.3 10*3/uL (ref 0.0–0.5)
Eosinophils Relative: 3 %
HCT: 44.4 % (ref 39.0–52.0)
Hemoglobin: 15.2 g/dL (ref 13.0–17.0)
Immature Granulocytes: 0 %
Lymphocytes Relative: 18 %
Lymphs Abs: 1.6 10*3/uL (ref 0.7–4.0)
MCH: 29.6 pg (ref 26.0–34.0)
MCHC: 34.2 g/dL (ref 30.0–36.0)
MCV: 86.5 fL (ref 80.0–100.0)
Monocytes Absolute: 0.9 10*3/uL (ref 0.1–1.0)
Monocytes Relative: 10 %
Neutro Abs: 6.2 10*3/uL (ref 1.7–7.7)
Neutrophils Relative %: 68 %
Platelet Count: 221 10*3/uL (ref 150–400)
RBC: 5.13 MIL/uL (ref 4.22–5.81)
RDW: 12.9 % (ref 11.5–15.5)
WBC Count: 9 10*3/uL (ref 4.0–10.5)
nRBC: 0 % (ref 0.0–0.2)

## 2022-04-15 MED ORDER — FAMOTIDINE IN NACL 20-0.9 MG/50ML-% IV SOLN
20.0000 mg | Freq: Once | INTRAVENOUS | Status: AC
  Administered 2022-04-15: 20 mg via INTRAVENOUS
  Filled 2022-04-15: qty 50

## 2022-04-15 MED ORDER — ACETAMINOPHEN 325 MG PO TABS
650.0000 mg | ORAL_TABLET | Freq: Once | ORAL | Status: AC
  Administered 2022-04-15: 650 mg via ORAL
  Filled 2022-04-15: qty 2

## 2022-04-15 MED ORDER — SODIUM CHLORIDE 0.9 % IV SOLN: Freq: Once | INTRAVENOUS | Status: AC

## 2022-04-15 MED ORDER — DIPHENHYDRAMINE HCL 25 MG PO CAPS
25.0000 mg | ORAL_CAPSULE | Freq: Once | ORAL | Status: AC
  Administered 2022-04-15: 25 mg via ORAL
  Filled 2022-04-15: qty 1

## 2022-04-15 MED ORDER — MONTELUKAST SODIUM 10 MG PO TABS
10.0000 mg | ORAL_TABLET | Freq: Once | ORAL | Status: AC
  Administered 2022-04-15: 10 mg via ORAL
  Filled 2022-04-15: qty 1

## 2022-04-15 MED ORDER — SODIUM CHLORIDE 0.9 % IV SOLN
375.0000 mg/m2 | Freq: Once | INTRAVENOUS | Status: AC
  Administered 2022-04-15: 800 mg via INTRAVENOUS
  Filled 2022-04-15: qty 50

## 2022-04-15 NOTE — Patient Instructions (Signed)
[facility]  Discharge Instructions: Thank you for choosing [facility] to provide your oncology and hematology care.   If you have a lab appointment with the Cancer Center, please go directly to the Cancer Center and check in at the registration area.   Wear comfortable clothing and clothing appropriate for easy access to any Portacath or PICC line.   We strive to give you quality time with your provider. You may need to reschedule your appointment if you arrive late (15 or more minutes).  Arriving late affects you and other patients whose appointments are after yours.  Also, if you miss three or more appointments without notifying the office, you may be dismissed from the clinic at the provider's discretion.      For prescription refill requests, have your pharmacy contact our office and allow 72 hours for refills to be completed.    Today you received the following chemotherapy and/or immunotherapy agents: rituximab-pvvr      To help prevent nausea and vomiting after your treatment, we encourage you to take your nausea medication as directed.  BELOW ARE SYMPTOMS THAT SHOULD BE REPORTED IMMEDIATELY: *FEVER GREATER THAN 100.4 F (38 C) OR HIGHER *CHILLS OR SWEATING *NAUSEA AND VOMITING THAT IS NOT CONTROLLED WITH YOUR NAUSEA MEDICATION *UNUSUAL SHORTNESS OF BREATH *UNUSUAL BRUISING OR BLEEDING *URINARY PROBLEMS (pain or burning when urinating, or frequent urination) *BOWEL PROBLEMS (unusual diarrhea, constipation, pain near the anus) TENDERNESS IN MOUTH AND THROAT WITH OR WITHOUT PRESENCE OF ULCERS (sore throat, sores in mouth, or a toothache) UNUSUAL RASH, SWELLING OR PAIN  UNUSUAL VAGINAL DISCHARGE OR ITCHING   Items with * indicate a potential emergency and should be followed up as soon as possible or go to the Emergency Department if any problems should occur.  Please show the CHEMOTHERAPY ALERT CARD or IMMUNOTHERAPY ALERT CARD at  check-in to the Emergency Department and triage nurse.  Should you have questions after your visit or need to cancel or reschedule your appointment, please contact [facility]  Dept: [phone]  and follow the prompts.  Office hours are 8:00 a.m. to 4:30 p.m. Monday - Friday. Please note that voicemails left after 4:00 p.m. may not be returned until the following business day.  We are closed weekends and major holidays. You have access to a nurse at all times for urgent questions. Please call the main number to the clinic Dept: [phone] and follow the prompts.   For any non-urgent questions, you may also contact your provider using MyChart. We now offer e-Visits for anyone 18 and older to request care online for non-urgent symptoms. For details visit [email or web address].   Also download the MyChart app! Go to the app store, search "MyChart", open the app, select [place], and log in with your MyChart username and password.  

## 2022-04-19 ENCOUNTER — Encounter: Payer: Self-pay | Admitting: Hematology and Oncology

## 2022-04-20 ENCOUNTER — Encounter: Payer: Self-pay | Admitting: Hematology and Oncology

## 2022-04-20 ENCOUNTER — Telehealth: Payer: Self-pay

## 2022-04-20 NOTE — Telephone Encounter (Signed)
Called wife regarding mychart messages. Told her allopurinol is for 1 month only, stop taking once the Rx is finished.  Clarified the itchy scalp complaint on the [employer]. The scalp is itchy only, denies rash. Instructed to use OTC hydrocortisone cream to scalp as needed. Wife verbalized understanding.  Wife will call the office back for questions/ concerns.

## 2022-04-22 ENCOUNTER — Inpatient Hospital Stay (HOSPITAL_BASED_OUTPATIENT_CLINIC_OR_DEPARTMENT_OTHER): Payer: Medicare Other | Admitting: Hematology and Oncology

## 2022-04-22 ENCOUNTER — Inpatient Hospital Stay: Payer: Medicare Other

## 2022-04-22 ENCOUNTER — Encounter: Payer: Self-pay | Admitting: Cardiovascular Disease

## 2022-04-22 ENCOUNTER — Encounter: Payer: Self-pay | Admitting: Hematology and Oncology

## 2022-04-22 VITALS — BP 122/59 | HR 56 | Temp 97.4°F | Resp 18 | Ht 72.0 in | Wt 189.6 lb

## 2022-04-22 VITALS — BP 96/62 | HR 60 | Temp 97.6°F | Resp 18

## 2022-04-22 DIAGNOSIS — C8280 Other types of follicular lymphoma, unspecified site: Secondary | ICD-10-CM | POA: Diagnosis not present

## 2022-04-22 DIAGNOSIS — L270 Generalized skin eruption due to drugs and medicaments taken internally: Secondary | ICD-10-CM | POA: Diagnosis not present

## 2022-04-22 DIAGNOSIS — Z5112 Encounter for antineoplastic immunotherapy: Secondary | ICD-10-CM | POA: Diagnosis not present

## 2022-04-22 DIAGNOSIS — C8288 Other types of follicular lymphoma, lymph nodes of multiple sites: Secondary | ICD-10-CM | POA: Diagnosis not present

## 2022-04-22 LAB — CMP (CANCER CENTER ONLY)
ALT: 30 U/L (ref 0–44)
AST: 27 U/L (ref 15–41)
Albumin: 4 g/dL (ref 3.5–5.0)
Alkaline Phosphatase: 65 U/L (ref 38–126)
Anion gap: 10 (ref 5–15)
BUN: 13 mg/dL (ref 8–23)
CO2: 27 mmol/L (ref 22–32)
Calcium: 9.2 mg/dL (ref 8.9–10.3)
Chloride: 101 mmol/L (ref 98–111)
Creatinine: 0.99 mg/dL (ref 0.61–1.24)
GFR, Estimated: >60 mL/min (ref >60)
Glucose, Bld: 175 mg/dL — ABNORMAL HIGH (ref 70–99)
Potassium: 3.8 mmol/L (ref 3.5–5.1)
Sodium: 138 mmol/L (ref 135–145)
Total Bilirubin: 0.8 mg/dL (ref 0.3–1.2)
Total Protein: 6.5 g/dL (ref 6.5–8.1)

## 2022-04-22 LAB — CBC WITH DIFFERENTIAL (CANCER CENTER ONLY)
Abs Immature Granulocytes: 0.05 10*3/uL (ref 0.00–0.07)
Basophils Absolute: 0.1 10*3/uL (ref 0.0–0.1)
Basophils Relative: 1 %
Eosinophils Absolute: 0.4 10*3/uL (ref 0.0–0.5)
Eosinophils Relative: 4 %
HCT: 44.7 % (ref 39.0–52.0)
Hemoglobin: 14.9 g/dL (ref 13.0–17.0)
Immature Granulocytes: 1 %
Lymphocytes Relative: 12 %
Lymphs Abs: 1.3 10*3/uL (ref 0.7–4.0)
MCH: 29.3 pg (ref 26.0–34.0)
MCHC: 33.3 g/dL (ref 30.0–36.0)
MCV: 88 fL (ref 80.0–100.0)
Monocytes Absolute: 0.7 10*3/uL (ref 0.1–1.0)
Monocytes Relative: 7 %
Neutro Abs: 8 10*3/uL — ABNORMAL HIGH (ref 1.7–7.7)
Neutrophils Relative %: 75 %
Platelet Count: 203 10*3/uL (ref 150–400)
RBC: 5.08 MIL/uL (ref 4.22–5.81)
RDW: 12.7 % (ref 11.5–15.5)
WBC Count: 10.4 10*3/uL (ref 4.0–10.5)
nRBC: 0 % (ref 0.0–0.2)

## 2022-04-22 MED ORDER — MONTELUKAST SODIUM 10 MG PO TABS
10.0000 mg | ORAL_TABLET | Freq: Once | ORAL | Status: AC
  Administered 2022-04-22: 10 mg via ORAL
  Filled 2022-04-22: qty 1

## 2022-04-22 MED ORDER — PREDNISONE 20 MG PO TABS: 20.0000 mg | ORAL_TABLET | Freq: Every day | ORAL | 0 refills | Status: DC

## 2022-04-22 MED ORDER — SODIUM CHLORIDE 0.9 % IV SOLN
375.0000 mg/m2 | Freq: Once | INTRAVENOUS | Status: AC
  Administered 2022-04-22: 800 mg via INTRAVENOUS
  Filled 2022-04-22: qty 50

## 2022-04-22 MED ORDER — SODIUM CHLORIDE 0.9 % IV SOLN: Freq: Once | INTRAVENOUS | Status: AC

## 2022-04-22 MED ORDER — ACETAMINOPHEN 325 MG PO TABS
650.0000 mg | ORAL_TABLET | Freq: Once | ORAL | Status: AC
  Administered 2022-04-22: 650 mg via ORAL
  Filled 2022-04-22: qty 2

## 2022-04-22 MED ORDER — DIPHENHYDRAMINE HCL 25 MG PO CAPS
25.0000 mg | ORAL_CAPSULE | Freq: Once | ORAL | Status: AC
  Administered 2022-04-22: 25 mg via ORAL
  Filled 2022-04-22: qty 1

## 2022-04-22 MED ORDER — FAMOTIDINE IN NACL 20-0.9 MG/50ML-% IV SOLN
20.0000 mg | Freq: Once | INTRAVENOUS | Status: AC
  Administered 2022-04-22: 20 mg via INTRAVENOUS
  Filled 2022-04-22: qty 50

## 2022-04-22 NOTE — Patient Instructions (Signed)
[facility] [facility]  Discharge Instructions: Thank you for choosing [facility] to provide your oncology and hematology care.   If you have a lab appointment with the Cancer Center, please go directly to the Cancer Center and check in at the registration area.   Wear comfortable clothing and clothing appropriate for easy access to any Portacath or PICC line.   We strive to give you quality time with your provider. You may need to reschedule your appointment if you arrive late (15 or more minutes).  Arriving late affects you and other patients whose appointments are after yours.  Also, if you miss three or more appointments without notifying the office, you may be dismissed from the clinic at the provider's discretion.      For prescription refill requests, have your pharmacy contact our office and allow 72 hours for refills to be completed.    Today you received the following chemotherapy and/or immunotherapy agents: Rituximab-pvvr      To help prevent nausea and vomiting after your treatment, we encourage you to take your nausea medication as directed.  BELOW ARE SYMPTOMS THAT SHOULD BE REPORTED IMMEDIATELY: *FEVER GREATER THAN 100.4 F (38 C) OR HIGHER *CHILLS OR SWEATING *NAUSEA AND VOMITING THAT IS NOT CONTROLLED WITH YOUR NAUSEA MEDICATION *UNUSUAL SHORTNESS OF BREATH *UNUSUAL BRUISING OR BLEEDING *URINARY PROBLEMS (pain or burning when urinating, or frequent urination) *BOWEL PROBLEMS (unusual diarrhea, constipation, pain near the anus) TENDERNESS IN MOUTH AND THROAT WITH OR WITHOUT PRESENCE OF ULCERS (sore throat, sores in mouth, or a toothache) UNUSUAL RASH, SWELLING OR PAIN  UNUSUAL VAGINAL DISCHARGE OR ITCHING   Items with * indicate a potential emergency and should be followed up as soon as possible or go to the Emergency Department if any problems should occur.  Please show the CHEMOTHERAPY ALERT CARD or IMMUNOTHERAPY ALERT CARD at  check-in to the Emergency Department and triage nurse.  Should you have questions after your visit or need to cancel or reschedule your appointment, please contact [facility]  Dept: [phone]  and follow the prompts.  Office hours are 8:00 a.m. to 4:30 p.m. Monday - Friday. Please note that voicemails left after 4:00 p.m. may not be returned until the following business day.  We are closed weekends and major holidays. You have access to a nurse at all times for urgent questions. Please call the main number to the clinic Dept: [phone] and follow the prompts.   For any non-urgent questions, you may also contact your provider using MyChart. We now offer e-Visits for anyone 69 and older to request care online for non-urgent symptoms. For details visit [email or web address].   Also download the MyChart app! Go to the app store, search "MyChart", open the app, select [place], and log in with your MyChart username and password.

## 2022-04-22 NOTE — Progress Notes (Signed)
[facility] OFFICE PROGRESS NOTE  Patient Care Team: Dettinger, Elige Radon, MD as PCP - General (Family Medicine) Branch, Dorothe Pea, MD as PCP - Cardiology (Cardiology) Mealor, Roberts Gaudy, MD as PCP - Electrophysiology (Cardiology) Iva Boop, MD as Consulting Physician (Gastroenterology) Artis Delay, MD as Consulting Physician (Hematology and Oncology) Cherlyn Roberts, MD as Consulting Physician (Dermatology)  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HCC) Overall, he tolerated rituximab well However, he developed classic skin rash which I attribute that to Revlimid I recommend low-dose prednisone for a week and I will reassess next week For future refill, I plan to reduce the dose of lenalidomide from 20 mg to 15 in the future I will see him again next week for further toxicity review  Drug-induced skin rash This is due to Revlimid but not severe I think this can be managed with low-dose prednisone I will reassess next week   No orders of the defined types were placed in this encounter.   All questions were answered. The patient knows to call the clinic with any problems, questions or concerns. The total time spent in the appointment was 40 minutes encounter with patients including review of chart and various tests results, discussions about plan of care and coordination of care plan   Artis Delay, MD 04/22/2022 10:53 AM  INTERVAL HISTORY: Please see below for problem oriented charting. he returns for treatment follow-up with his wife He tolerated rituximab well He started to develop itchy scalp several days ago and now have a few rash distributed throughout his torso Who has occasional muscle cramps.  He was started on vitamin D and calcium by his primary care doctor due to osteopenia  REVIEW OF SYSTEMS:   Constitutional: Denies fevers, chills or abnormal weight loss Eyes: Denies blurriness of vision Ears, nose, mouth, throat, and face:  Denies mucositis or sore throat Respiratory: Denies cough, dyspnea or wheezes Cardiovascular: Denies palpitation, chest discomfort or lower extremity swelling Gastrointestinal:  Denies nausea, heartburn or change in bowel habits Lymphatics: Denies new lymphadenopathy or easy bruising Neurological:Denies numbness, tingling or new weaknesses Behavioral/Psych: Mood is stable, no new changes  All other systems were reviewed with the patient and are negative.  I have reviewed the past medical history, past surgical history, social history and family history with the patient and they are unchanged from previous note.  ALLERGIES:  has No Known Allergies.  MEDICATIONS:  Current Outpatient Medications  Medication Sig Dispense Refill   lenalidomide (REVLIMID) 15 MG capsule Take 15 mg by mouth daily. Take 15 mg daily, take for 21 days on, 7 days off, repeat every 28 days. per Dr. Bertis Ruddy, will reduce to 15 mg with next refill.     predniSONE (DELTASONE) 20 MG tablet Take 1 tablet (20 mg total) by mouth daily with breakfast. 30 tablet 0   acyclovir (ZOVIRAX) 400 MG tablet Take 1 tablet (400 mg total) by mouth daily. 90 tablet 1   allopurinol (ZYLOPRIM) 300 MG tablet Take 1 tablet (300 mg total) by mouth daily. 30 tablet 1   apixaban (ELIQUIS) 5 MG TABS tablet TAKE 1 TABLET TWICE A DAY 180 tablet 1   Ascorbic Acid (VITAMIN C) 1000 MG tablet Take 1,000 mg by mouth daily.     brimonidine (ALPHAGAN) 0.2 % ophthalmic solution Place 1 drop into both eyes 2 (two) times daily.      Calcium Carbonate-Vit D-Min (CALCIUM 1200 PO) Take 1,200 mg by mouth daily.  cholecalciferol (VITAMIN D3) 25 MCG (1000 UNIT) tablet Take 1,000 Units by mouth daily.     finasteride (PROSCAR) 5 MG tablet Take 1 tablet (5 mg total) by mouth daily. 90 tablet 3   Lifitegrast (XIIDRA) 5 % SOLN Apply 1 drop to eye 2 (two) times daily.     LUMIGAN 0.01 % SOLN Place 1 drop into both eyes at bedtime.     meclizine (ANTIVERT) 25 MG  tablet Take 1 tablet (25 mg total) by mouth 3 (three) times daily as needed for dizziness. 30 tablet 0   metoprolol succinate (TOPROL-XL) 25 MG 24 hr tablet Take 0.5 tablets (12.5 mg total) by mouth 2 (two) times daily. 90 tablet 3   mometasone (ELOCON) 0.1 % cream Apply 1 Application topically 2 (two) times daily as needed (eczema).     Multiple Vitamin (MULTIVITAMIN WITH MINERALS) TABS tablet Take 1 tablet by mouth daily.      omeprazole (PRILOSEC OTC) 20 MG tablet Take 20 mg by mouth every other day.     ondansetron (ZOFRAN) 8 MG tablet Take 1 tablet (8 mg total) by mouth every 8 (eight) hours as needed for nausea or vomiting. (Patient not taking: Reported on 04/08/2022) 30 tablet 1   Polyethyl Glycol-Propyl Glycol (SYSTANE OP) Place 1 drop into both eyes daily as needed (dry eyes).     Probiotic Product (SUPERIOR PROBIOTIC) CAPS Take 1 capsule by mouth daily.     prochlorperazine (COMPAZINE) 10 MG tablet Take 1 tablet (10 mg total) by mouth every 6 (six) hours as needed for nausea or vomiting. 30 tablet 1   rosuvastatin (CRESTOR) 20 MG tablet Take 1 tablet (20 mg total) by mouth daily. 90 tablet 3   silodosin (RAPAFLO) 8 MG CAPS capsule Take 1 capsule (8 mg total) by mouth daily with breakfast. 90 capsule 3   No current facility-administered medications for this visit.   Facility-Administered Medications Ordered in Other Visits  Medication Dose Route Frequency Provider Last Rate Last Admin   riTUXimab-pvvr (RUXIENCE) 800 mg in sodium chloride 0.9 % 250 mL (2.4242 mg/mL) infusion  375 mg/m2 (Treatment Plan Recorded) Intravenous Once Artis Delay, MD        SUMMARY OF ONCOLOGIC HISTORY: Oncology History Overview Note  Low grade B-cell lymphoma   Primary site: Lymphoid Neoplasms   Staging method: AJCC 6th Edition   Clinical: Stage IV signed by Artis Delay, MD on 09/26/2013  9:15 AM   Summary: Stage IV      Follicular low grade B-cell lymphoma  12/10/2003 Surgery   Inguinal lymph node  biopsy showed follicular lymphoma.   12/12/2003 - 06/01/2004 Chemotherapy   He was treated with R. CHOP chemotherapy which show complete remission. The number of cycles of R. CHOP chemotherapy was unknown.   12/19/2006 Surgery   Lung resection show follicular lymphoma.   01/02/2007 - 09/01/2008 Chemotherapy   The patient was treated with single agent rituximab alone.   01/19/2007 Bone Marrow Biopsy   Bone marrow biopsy was negative.   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   05/08/2013 - 05/11/2013 Hospital Admission   The patient was admitted to the hospital for management of pericarditis. CT scan showed extensive lymphadenopathy.   06/07/2013 Imaging   PET/CT scan showed extensive lymphadenopathy   06/25/2013 Procedure   He has placement of Infuse-a-Port.   06/28/2013 Bone Marrow Biopsy   Bone marrow biopsy is positive for lymphoma involvement.   07/04/2013 - 11/22/2013 Chemotherapy   He  is treated with 6 cycles of bendamustine with rituximab.   09/24/2013 Imaging   Repeat PET scan show complete remission.   12/26/2013 Imaging   PEt scan showed complete remission   09/26/2014 Imaging   CT scan of the chest abdomen and pelvis show no evidence of disease   03/26/2015 Imaging   CT scan showed no evidence of lymphoma   04/14/2016 Imaging   CT: Borderline prominent right hilar and subcarinal lymph nodes, but not appreciably changed. 2. Low-grade but increased central mesenteric stranding with some small mesenteric lymph nodes. This could certainly be inflammatory, 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, much of which is likely a physiologic  response to cholecystectomy. 7. Sigmoid colon diverticulosis. 8.  Prominent stool throughout the colon favors constipation. 9. Enlarged prostate gland, volume estimated at 75 cubic cm.   02/15/2017 Imaging   No evidence of recurrent lymphoma or other acute findings.  Stable moderate hiatal hernia.  Colonic diverticulosis, without radiographic evidence of diverticulitis.  Stable mildly enlarged prostate.  Mild emphysema.  Aortic and coronary artery atherosclerosis.   01/31/2019 Imaging   1. Interval development of abdominal and pelvic adenopathy compatible with recurrent lymphoma. 2. Emphysema and aortic atherosclerosis. 3. Multi vessel coronary artery calcifications. 4. Moderate to large hiatal hernia   02/09/2019 Procedure   Successful CT-guided core biopsy of the left retroperitoneal periaortic adenopathy   02/09/2019 Pathology Results   SURGICAL PATHOLOGY  CASE: [id number]  PATIENT: [name]  Surgical Pathology Report   Clinical History: Lymphoma; [name], LEFT PARA AORTIC, [name], cleaved nuclei and scant cytoplasm.  By immunohistochemistry, [name], [name], [name], [name] is low (less than 10%).  Flow cytometry was attempted; however, there was insufficient material for analysis (see [id number]).  Overall, the features are consistent with relapse of the patient's previously diagnosed follicular lymphoma.  Based on the biopsy, this is  favored to be a low-grade follicular lymphoma   02/19/2019 -  Chemotherapy   The patient had idelisib for chemotherapy treatment.     05/17/2019 Imaging   1. Interval generalized decrease in abdominopelvic lymphadenopathy identified on the previous study. No new or progressive lymphadenopathy on today's study. 2. Moderate hiatal hernia. 3.  Emphysema (ICD10-J43.9) and Aortic Atherosclerosis (ICD10-170.0)   11/16/2019 Imaging   IMPRESSION: Chest Impression:   1. No mediastinal lymphadenopathy. 2. Post RIGHT lung wedge resection without evidence local recurrence.   Abdomen / Pelvis Impression:   1. Stable numerous small periaortic retroperitoneal nodes and common iliac nodes. No progression of adenopathy. 2. No splenomegaly.  No skeletal metastasis.     07/31/2020 Imaging   1. Stable examination. No significant interval change in the prominent/mildly enlarged lymph nodes below the diaphragm. No thoracic adenopathy.  No splenomegaly. 2. Postsurgical change of right lung wedge resection without evidence of local recurrence. 3. Large hiatal hernia. 4. Colonic diverticulosis without findings of acute diverticulitis. 5. Aortic Atherosclerosis (ICD10-I70.0) and Emphysema (ICD10-J43.9).     01/29/2021 Imaging   1. Overall mild interval enlargement of lymph nodes in the abdomen and pelvis as described. 2. No lymphadenopathy identified in the chest. 3. Emphysematous changes of the lungs. 4. Large hiatal hernia.  Extensive colonic diverticulosis. 5. Other ancillary findings as described.     04/30/2021 Imaging   Chest Impression:   1. No mediastinal lymphadenopathy. 2. Large hiatal hernia. 3. Retrocrural node slightly decreased in size.   Abdomen / Pelvis Impression:   1. Cluster of numerous small retroperitoneal periaortic lymph nodes. Lymph nodes are decreased slightly in size compared to recent CT scan. 2. Likewise reduction in LEFT external iliac small lymph node. 3. No evidence of  progression of lymphoma. 4. Normal spleen. 5. No bone involvement.   10/28/2021 Imaging   1. Overall trend towards progressive disease on today's exam. Lymph nodes in the hepatoduodenal ligament are upper normal for size but stable in the interval. Index retroperitoneal lymph nodes are progressive, notably in the right common iliac chain. Soft tissue nodule/lymph node in the retroperitoneal fat adjacent to the right iliacus muscle is progressive. 2. Large hiatal hernia. 3. Colonic diverticulosis without diverticulitis. 4. Prostatomegaly. 5. Aortic Atherosclerosis (ICD10-I70.0) and Emphysema (ICD10-J43.9).   03/10/2022 PET scan   Multiple intensely avid and enlarged retroperitoneal lymph nodes consistent with patient's known history of lymphoma. Of note, some of these lymph nodes demonstrate interval growth when compared to most recent abdominal CT, for reference to the node adjacent to the right psoas musculature. For purposes of assessing transformation into a more aggressive form of lymphoma, SUV max calculated using total body weight is 10.5 for the largest right iliac node and 9.8 for the soft tissue implant posterior to the right psoas muscle.  - Similar focus of increased hypermetabolic to be within the right hilar region, which may represent mediastinal involvement versus reactive etiology. Attention on follow-up examinations.    03/24/2022 Procedure   PROCEDURE: The patient was placed in the prone position. Initial CT images of the pelvis were obtained, demonstrating enlarged pelvic lymph node.   An appropriate entry site was identified and marked on the skin. The right back was prepped and draped using all elements of maximal sterile barrier technique.  Local anesthesia was achieved with lidocaine.   Under intermittent CT guidance, a 18-G coaxial needle was inserted into right pelvic lymph node.  A total of 3 cores were obtained through the cannula using spring-loaded 18-G Corvocet biopsy  device.    03/24/2022 Pathology Results   A: Lymph node, pelvis, right, biopsy -  Involved by follicular lymphoma (see Comment)   Special Stains: CD5, [name] and CD20 are performed by immunohistochemistry and/or in situ hybridization in addition to flow cytometry for histopathologic and immunophenotypic correlation.   Appropriate controls for each stain have been evaluated and stain as expected.   Sections of block A1 are stained.   CD3: CD3 stain is negative in neoplastic lymphocytes. CD20: CD20 stains neoplastic lymphocytes. CD21: CD21 stains follicular dendritic cells of enlarge neoplastic follicles. CD5: CD5 mirrors CD3 [name]: [name] stains weakly neoplastic lymphocytes. Bcl-6: Bcl-6 stains neoplastic lymphocytes. Bcl-2: Bcl-2 stains neoplastic lymphocytes.  Cyclin-D1: Cyclin-D1 stains is negative in neoplastic lymphocytes. Ki-67: Ki-67 shows low proliferation rate in neoplastic lymphocytes (10%). GMS: GMS stain is negative for microorganisms AFB:  AFB stain is negative for microorganisms   04/08/2022 -  Chemotherapy   Revlimid was started on 4/4 Patient is on Treatment Plan : LYMPHOMA Lenalidomide + Rituximab q28d        PHYSICAL EXAMINATION: ECOG PERFORMANCE STATUS: 1 - Symptomatic but completely ambulatory  Vitals:   04/22/22 0837  BP: (!) 122/59  Pulse: (!) 56  Resp: 18  Temp: (!) 97.4 F (36.3 C)  SpO2: 97%   Filed Weights   04/22/22 0837  Weight: 189 lb 9.6 oz (86 kg)    GENERAL:alert, no distress and comfortable SKIN: He has mild papular rash distributed on his torso and arms but none elsewhere.  He also have a few lesions on his skull EYES: normal, Conjunctiva are pink and non-injected, sclera clear OROPHARYNX:no exudate, no erythema and lips, buccal mucosa, and tongue normal  NECK: supple, thyroid normal size, non-tender, without nodularity LYMPH:  no palpable lymphadenopathy in the cervical, axillary or inguinal LUNGS: clear to auscultation and percussion  with normal breathing effort HEART: regular rate & rhythm and no murmurs and no lower extremity edema ABDOMEN:abdomen soft, non-tender and normal bowel sounds Musculoskeletal:no cyanosis of digits and no clubbing  NEURO: alert & oriented x 3 with fluent speech, no focal motor/sensory deficits  LABORATORY DATA:  I have reviewed the data as listed    Component Value Date/Time   NA 138 04/22/2022 0812   NA 139 01/28/2022 0822   NA 139 04/14/2016 0943   K 3.8 04/22/2022 0812   K 4.6 04/14/2016 0943   CL 101 04/22/2022 0812   CL 104 03/24/2012 1024   CO2 27 04/22/2022 0812   CO2 25 04/14/2016 0943   GLUCOSE 175 (H) 04/22/2022 0812   GLUCOSE 111 04/14/2016 0943   GLUCOSE 80 03/24/2012 1024   BUN 13 04/22/2022 0812   BUN 12 01/28/2022 0822   BUN 8.6 04/14/2016 0943   CREATININE 0.99 04/22/2022 0812   CREATININE 1.1 04/14/2016 0943   CALCIUM 9.2 04/22/2022 0812   CALCIUM 9.4 04/14/2016 0943   PROT 6.5 04/22/2022 0812   PROT 6.4 10/05/2021 1016   PROT 6.5 04/14/2016 0943   ALBUMIN 4.0 04/22/2022 0812   ALBUMIN 4.4 10/05/2021 1016   ALBUMIN 3.8 04/14/2016 0943   AST 27 04/22/2022 0812   AST 28 04/14/2016 0943   ALT 30 04/22/2022 0812   ALT 41 04/14/2016 0943   ALKPHOS 65 04/22/2022 0812   ALKPHOS 71 04/14/2016 0943   BILITOT 0.8 04/22/2022 0812   BILITOT 0.61 04/14/2016 0943   GFRNONAA >60 04/22/2022 0812   GFRAA 69 09/27/2019 0801    No results found for: "SPEP", "UPEP"  Lab Results  Component Value Date   WBC 10.4 04/22/2022   NEUTROABS 8.0 (H) 04/22/2022   HGB 14.9 04/22/2022   HCT 44.7 04/22/2022   MCV 88.0 04/22/2022   PLT 203 04/22/2022      Chemistry      Component Value Date/Time   NA 138 04/22/2022 0812   NA 139 01/28/2022 0822   NA 139 04/14/2016 0943   K 3.8 04/22/2022 0812   K 4.6 04/14/2016 0943   CL 101 04/22/2022 0812   CL 104 03/24/2012 1024   CO2 27 04/22/2022 0812   CO2 25 04/14/2016 0943   BUN 13 04/22/2022 0812   BUN 12 01/28/2022 0822    BUN 8.6 04/14/2016 0943   CREATININE 0.99 04/22/2022 0812   CREATININE 1.1 04/14/2016 0943      Component Value Date/Time  CALCIUM 9.2 04/22/2022 0812   CALCIUM 9.4 04/14/2016 0943   ALKPHOS 65 04/22/2022 0812   ALKPHOS 71 04/14/2016 0943   AST 27 04/22/2022 0812   AST 28 04/14/2016 0943   ALT 30 04/22/2022 0812   ALT 41 04/14/2016 0943   BILITOT 0.8 04/22/2022 0812   BILITOT 0.61 04/14/2016 0943       RADIOGRAPHIC STUDIES: I have personally reviewed the radiological images as listed and agreed with the findings in the report. DG [facility] DEXA  Result Date: 04/05/2022 EXAM: DUAL X-RAY ABSORPTIOMETRY (DXA) FOR BONE MINERAL DENSITY 04/05/2022 11:29 am CLINICAL DATA:  77 year old Male History of lymphoma History of fragility fracture. TECHNIQUE: An axial (e.g., hips, spine) and/or appendicular (e.g., radius) exam was performed, as appropriate, using [employer] systems engineer at [facility]. Images are obtained for bone mineral density measurement and are not obtained for diagnostic purposes. [id number] Exclusions: L3. COMPARISON:  09/29/2015 FINDINGS: Scan quality: Good. LUMBAR SPINE (L1-L2, L4): BMD (in g/cm2): 1.077 T-score: -0.9 Z-score: -0.8 Rate of change from previous exam: 3.8 % LEFT FEMORAL NECK: BMD (in g/cm2): 0.861 T-score: -1.3 Z-score: -0.3 LEFT TOTAL HIP: BMD (in g/cm2): 0.853 T-score: -1.2 Z-score: -0.9 RIGHT FEMORAL NECK: BMD (in g/cm2): 0.852 T-score: -1.3 Z-score: -0.4 RIGHT TOTAL HIP: BMD (in g/cm2): 0.858 T-score: -1.2 Z-score: -0.9 Dual femur rate of change from previous exam: No significant rate of change from previous exam. FRAX 10-YEAR PROBABILITY OF FRACTURE: 10-year fracture risk is performed using the [facility] FRAX calculator based on patient-reported risk factors. Major osteoporotic fracture: 22.1% Hip fracture: 14.5% Other situations known to alter the reliability of the FRAX score should be considered when making  treatment decisions, including chronic glucocorticoid use and past treatments. Further guidance on treatment can be found at the [facility] Osteoporosis Foundation's website [email or web address]/. IMPRESSION: Osteopenia based on BMD. Fracture risk is increased. Increased risk is based on low BMD, FRAX calculation, and history of fracture. RECOMMENDATIONS: 1. All patients should optimize calcium and vitamin D intake. 2. Consider FDA-approved medical therapies in postmenopausal women and men aged 39 years and older, based on the following: - A hip or vertebral (clinical or morphometric) fracture - T-score less than or equal to -2.5 and secondary causes have been excluded. - Low bone mass (T-score between -1.0 and -2.5) and a 10-year probability of a hip fracture greater than or equal to 3% or a 10-year probability of a major osteoporosis-related fracture greater than or equal to 20% based on the [place]-adapted WHO algorithm. - Clinician judgment and/or patient preferences may indicate treatment for people with 10-year fracture probabilities above or below these levels 3. Patients with diagnosis of osteoporosis or at high risk for fracture should have regular bone mineral density tests. For patients eligible for Medicare, routine testing is allowed once every 2 years. The testing frequency can be increased to one year for patients who have rapidly progressing disease, those who are receiving or discontinuing medical therapy to restore bone mass, or have additional risk factors. Electronically Signed   By: Gerome Sam III M.D.   On: 04/05/2022 11:43

## 2022-04-22 NOTE — Progress Notes (Signed)
Prior to Rituximab infusion Dr. Bertis Ruddy notified of pt's BP. OK to proceed with infusion. SHe would like pt to increase PO intake and take BP later at home.

## 2022-04-22 NOTE — Assessment & Plan Note (Signed)
This is due to Revlimid but not severe I think this can be managed with low-dose prednisone I will reassess next week

## 2022-04-22 NOTE — Assessment & Plan Note (Signed)
Overall, he tolerated rituximab well However, he developed classic skin rash which I attribute that to Revlimid I recommend low-dose prednisone for a week and I will reassess next week For future refill, I plan to reduce the dose of lenalidomide from 20 mg to 15 in the future I will see him again next week for further toxicity review

## 2022-04-25 ENCOUNTER — Other Ambulatory Visit: Payer: Self-pay | Admitting: Urology

## 2022-04-25 ENCOUNTER — Other Ambulatory Visit: Payer: Self-pay | Admitting: Hematology and Oncology

## 2022-04-25 DIAGNOSIS — N138 Other obstructive and reflux uropathy: Secondary | ICD-10-CM

## 2022-04-26 ENCOUNTER — Encounter: Payer: Self-pay | Admitting: Cardiology

## 2022-04-26 ENCOUNTER — Encounter: Payer: Self-pay | Admitting: Hematology and Oncology

## 2022-04-26 ENCOUNTER — Other Ambulatory Visit: Payer: Self-pay | Admitting: Hematology and Oncology

## 2022-04-26 ENCOUNTER — Telehealth: Payer: Self-pay

## 2022-04-26 MED ORDER — CYCLOBENZAPRINE HCL 5 MG PO TABS: 5.0000 mg | ORAL_TABLET | Freq: Three times a day (TID) | ORAL | 0 refills | Status: DC | PRN

## 2022-04-26 NOTE — Telephone Encounter (Signed)
Returned call to wife. [name] started having cramping/ muscle spasms to thumbs on Friday. He was having the same problem in his toes but that is better. He is trying heat. But, he is still uncomfortable.  She is requesting muscle relaxer, maybe flexeril to [facility] pharmacy? Or what Dr. Bertis Ruddy recommends.

## 2022-04-26 NOTE — Telephone Encounter (Signed)
Called and given below message. Wife verbalized understanding. Rx sent for Flexeril to pharmacy. The side effect can be constipation/ sleepiness. Wife verbalized understanding.

## 2022-04-26 NOTE — Telephone Encounter (Signed)
These symptoms are not caused by his treatment I am concerned his symptoms are caused by side-effects from crestor

## 2022-04-29 ENCOUNTER — Inpatient Hospital Stay: Payer: Medicare Other

## 2022-04-29 ENCOUNTER — Other Ambulatory Visit: Payer: Self-pay | Admitting: Hematology and Oncology

## 2022-04-29 ENCOUNTER — Encounter: Payer: Self-pay | Admitting: Hematology and Oncology

## 2022-04-29 ENCOUNTER — Inpatient Hospital Stay (HOSPITAL_BASED_OUTPATIENT_CLINIC_OR_DEPARTMENT_OTHER): Payer: Medicare Other | Admitting: Hematology and Oncology

## 2022-04-29 VITALS — BP 126/69 | HR 76 | Temp 97.7°F | Resp 19 | Wt 188.2 lb

## 2022-04-29 VITALS — BP 106/63 | HR 56 | Resp 18

## 2022-04-29 DIAGNOSIS — C8280 Other types of follicular lymphoma, unspecified site: Secondary | ICD-10-CM

## 2022-04-29 DIAGNOSIS — Z5112 Encounter for antineoplastic immunotherapy: Secondary | ICD-10-CM | POA: Diagnosis not present

## 2022-04-29 DIAGNOSIS — C8288 Other types of follicular lymphoma, lymph nodes of multiple sites: Secondary | ICD-10-CM | POA: Diagnosis not present

## 2022-04-29 DIAGNOSIS — L270 Generalized skin eruption due to drugs and medicaments taken internally: Secondary | ICD-10-CM | POA: Diagnosis not present

## 2022-04-29 LAB — CMP (CANCER CENTER ONLY)
ALT: 36 U/L (ref 0–44)
AST: 23 U/L (ref 15–41)
Albumin: 4 g/dL (ref 3.5–5.0)
Alkaline Phosphatase: 57 U/L (ref 38–126)
Anion gap: 9 (ref 5–15)
BUN: 16 mg/dL (ref 8–23)
CO2: 28 mmol/L (ref 22–32)
Calcium: 9 mg/dL (ref 8.9–10.3)
Chloride: 102 mmol/L (ref 98–111)
Creatinine: 1.02 mg/dL (ref 0.61–1.24)
GFR, Estimated: >60 mL/min (ref >60)
Glucose, Bld: 161 mg/dL — ABNORMAL HIGH (ref 70–99)
Potassium: 3.7 mmol/L (ref 3.5–5.1)
Sodium: 139 mmol/L (ref 135–145)
Total Bilirubin: 0.6 mg/dL (ref 0.3–1.2)
Total Protein: 6.3 g/dL — ABNORMAL LOW (ref 6.5–8.1)

## 2022-04-29 LAB — CBC WITH DIFFERENTIAL (CANCER CENTER ONLY)
Abs Immature Granulocytes: 0.04 10*3/uL (ref 0.00–0.07)
Basophils Absolute: 0.1 10*3/uL (ref 0.0–0.1)
Basophils Relative: 1 %
Eosinophils Absolute: 0.7 10*3/uL — ABNORMAL HIGH (ref 0.0–0.5)
Eosinophils Relative: 8 %
HCT: 45.1 % (ref 39.0–52.0)
Hemoglobin: 15.1 g/dL (ref 13.0–17.0)
Immature Granulocytes: 0 %
Lymphocytes Relative: 21 %
Lymphs Abs: 2 10*3/uL (ref 0.7–4.0)
MCH: 29.7 pg (ref 26.0–34.0)
MCHC: 33.5 g/dL (ref 30.0–36.0)
MCV: 88.8 fL (ref 80.0–100.0)
Monocytes Absolute: 1.6 10*3/uL — ABNORMAL HIGH (ref 0.1–1.0)
Monocytes Relative: 16 %
Neutro Abs: 5.3 10*3/uL (ref 1.7–7.7)
Neutrophils Relative %: 54 %
Platelet Count: 267 10*3/uL (ref 150–400)
RBC: 5.08 MIL/uL (ref 4.22–5.81)
RDW: 13 % (ref 11.5–15.5)
WBC Count: 9.7 10*3/uL (ref 4.0–10.5)
nRBC: 0 % (ref 0.0–0.2)

## 2022-04-29 MED ORDER — SODIUM CHLORIDE 0.9 % IV SOLN: Freq: Once | INTRAVENOUS | Status: AC

## 2022-04-29 MED ORDER — FAMOTIDINE IN NACL 20-0.9 MG/50ML-% IV SOLN
20.0000 mg | Freq: Once | INTRAVENOUS | Status: AC
  Administered 2022-04-29: 20 mg via INTRAVENOUS
  Filled 2022-04-29: qty 50

## 2022-04-29 MED ORDER — DIPHENHYDRAMINE HCL 25 MG PO CAPS
25.0000 mg | ORAL_CAPSULE | Freq: Once | ORAL | Status: AC
  Administered 2022-04-29: 25 mg via ORAL
  Filled 2022-04-29: qty 1

## 2022-04-29 MED ORDER — MONTELUKAST SODIUM 10 MG PO TABS
10.0000 mg | ORAL_TABLET | Freq: Once | ORAL | Status: AC
  Administered 2022-04-29: 10 mg via ORAL
  Filled 2022-04-29: qty 1

## 2022-04-29 MED ORDER — SODIUM CHLORIDE 0.9 % IV SOLN
375.0000 mg/m2 | Freq: Once | INTRAVENOUS | Status: AC
  Administered 2022-04-29: 800 mg via INTRAVENOUS
  Filled 2022-04-29: qty 50

## 2022-04-29 MED ORDER — ACETAMINOPHEN 325 MG PO TABS
650.0000 mg | ORAL_TABLET | Freq: Once | ORAL | Status: AC
  Administered 2022-04-29: 650 mg via ORAL
  Filled 2022-04-29: qty 2

## 2022-04-29 NOTE — Progress Notes (Signed)
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   All questions were answered. The patient knows to call the clinic with any problems, questions or concerns. The total time spent in the appointment was 25 minutes encounter with patients including review of chart and various tests results, discussions about plan of care and coordination of care plan   Artis Delay, MD 04/29/2022 9:39 AM  INTERVAL HISTORY: Please see below for problem oriented charting. he returns for treatment follow-up with his wife He is feeling well His rash has resolved He has no other new symptoms  REVIEW OF SYSTEMS:   Constitutional: Denies fevers, chills or abnormal weight loss Eyes: Denies blurriness of vision Ears, nose, mouth, throat, and face: Denies mucositis or sore throat Respiratory: Denies cough, dyspnea or wheezes Cardiovascular: Denies palpitation, chest discomfort or lower extremity swelling Gastrointestinal:  Denies nausea, heartburn or change in bowel  habits Lymphatics: Denies new lymphadenopathy or easy bruising Neurological:Denies numbness, tingling or new weaknesses Behavioral/Psych: Mood is stable, no new changes  All other systems were reviewed with the patient and are negative.  I have reviewed the past medical history, past surgical history, social history and family history with the patient and they are unchanged from previous note.  ALLERGIES:  has No Known Allergies.  MEDICATIONS:  Current Outpatient Medications  Medication Sig Dispense Refill   cyclobenzaprine (FLEXERIL) 5 MG tablet Take 1 tablet (5 mg total) by mouth 3 (three) times daily as needed for muscle spasms. 30 tablet 0   acyclovir (ZOVIRAX) 400 MG tablet Take 1 tablet (400 mg total) by mouth daily. 90 tablet 1   apixaban (ELIQUIS) 5 MG TABS tablet TAKE 1 TABLET TWICE A DAY 180 tablet 1   Ascorbic Acid (VITAMIN C) 1000 MG tablet Take 1,000 mg by mouth daily.     brimonidine (ALPHAGAN) 0.2 % ophthalmic solution Place 1 drop into both eyes 2 (two) times daily.      Calcium Carbonate-Vit D-Min (CALCIUM 1200 PO) Take 1,200 mg by mouth daily.     finasteride (PROSCAR) 5 MG tablet Take 1 tablet (5 mg total) by mouth daily. 90 tablet 3   lenalidomide (REVLIMID) 15 MG capsule Take 15 mg by mouth daily. Take 15 mg daily, take for 21 days on, 7 days off, repeat every 28 days. per Dr. Bertis Ruddy, will reduce to 15 mg with next refill.     Lifitegrast (XIIDRA) 5 % SOLN Apply 1 drop to eye 2 (two) times daily.     LUMIGAN 0.01 % SOLN Place 1 drop into both eyes at bedtime.     meclizine (ANTIVERT) 25 MG tablet  Take 1 tablet (25 mg total) by mouth 3 (three) times daily as needed for dizziness. 30 tablet 0   metoprolol succinate (TOPROL-XL) 25 MG 24 hr tablet Take 0.5 tablets (12.5 mg total) by mouth 2 (two) times daily. 90 tablet 3   mometasone (ELOCON) 0.1 % cream Apply 1 Application topically 2 (two) times daily as needed (eczema).     Multiple Vitamin (MULTIVITAMIN WITH MINERALS)  TABS tablet Take 1 tablet by mouth daily.      omeprazole (PRILOSEC OTC) 20 MG tablet Take 20 mg by mouth every other day.     ondansetron (ZOFRAN) 8 MG tablet Take 1 tablet (8 mg total) by mouth every 8 (eight) hours as needed for nausea or vomiting. (Patient not taking: Reported on 04/08/2022) 30 tablet 1   Polyethyl Glycol-Propyl Glycol (SYSTANE OP) Place 1 drop into both eyes daily as needed (dry eyes).     Probiotic Product (SUPERIOR PROBIOTIC) CAPS Take 1 capsule by mouth daily.     prochlorperazine (COMPAZINE) 10 MG tablet Take 1 tablet (10 mg total) by mouth every 6 (six) hours as needed for nausea or vomiting. 30 tablet 1   rosuvastatin (CRESTOR) 20 MG tablet Take 1 tablet (20 mg total) by mouth daily. 90 tablet 3   silodosin (RAPAFLO) 8 MG CAPS capsule Take 1 capsule (8 mg total) by mouth daily with breakfast. 90 capsule 3   No current facility-administered medications for this visit.    SUMMARY OF ONCOLOGIC HISTORY: Oncology History Overview Note  Low grade B-cell lymphoma   Primary site: Lymphoid Neoplasms   Staging method: AJCC 6th Edition   Clinical: Stage IV signed by Artis Delay, MD on 09/26/2013  9:15 AM   Summary: Stage IV      Follicular low grade B-cell lymphoma  12/10/2003 Surgery   Inguinal lymph node biopsy showed follicular lymphoma.   12/12/2003 - 06/01/2004 Chemotherapy   He was treated with R. CHOP chemotherapy which show complete remission. The number of cycles of R. CHOP chemotherapy was unknown.   12/19/2006 Surgery   Lung resection show follicular lymphoma.   01/02/2007 - 09/01/2008 Chemotherapy   The patient was treated with single agent rituximab alone.   01/19/2007 Bone Marrow Biopsy   Bone marrow biopsy was negative.   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   05/08/2013 - 05/11/2013 Hospital Admission   The patient was admitted to the hospital for management of pericarditis. CT scan showed extensive  lymphadenopathy.   06/07/2013 Imaging   PET/CT scan showed extensive lymphadenopathy   06/25/2013 Procedure   He has placement of Infuse-a-Port.   06/28/2013 Bone Marrow Biopsy   Bone marrow biopsy is positive for lymphoma involvement.   07/04/2013 - 11/22/2013 Chemotherapy   He is treated with 6 cycles of bendamustine with rituximab.   09/24/2013 Imaging   Repeat PET scan show complete remission.   12/26/2013 Imaging   PEt scan showed complete remission   09/26/2014 Imaging   CT scan of the chest abdomen and pelvis show no evidence of disease   03/26/2015 Imaging   CT scan showed no evidence of lymphoma   04/14/2016 Imaging   CT: Borderline prominent right hilar and subcarinal lymph nodes, but not appreciably changed. 2. Low-grade but increased central mesenteric stranding with some small mesenteric lymph nodes. This could certainly be inflammatory, and there is no bulky adenopathy to suggest a malignant etiology.  3. Coronary and aortoiliac atherosclerotic calcification. 4. Centrilobular and paraseptal emphysema. Postoperative  findings in the right lung. 5. 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, much of which is likely a physiologic response to cholecystectomy. 7. Sigmoid colon diverticulosis. 8.  Prominent stool throughout the colon favors constipation. 9. Enlarged prostate gland, volume estimated at 75 cubic cm.   02/15/2017 Imaging   No evidence of recurrent lymphoma or other acute findings.  Stable moderate hiatal hernia.  Colonic diverticulosis, without radiographic evidence of diverticulitis.  Stable mildly enlarged prostate.  Mild emphysema.  Aortic and coronary artery atherosclerosis.   01/31/2019 Imaging   1. Interval development of abdominal and pelvic adenopathy compatible with recurrent lymphoma. 2. Emphysema and aortic atherosclerosis. 3. Multi vessel coronary  artery calcifications. 4. Moderate to large hiatal hernia   02/09/2019 Procedure   Successful CT-guided core biopsy of the left retroperitoneal periaortic adenopathy   02/09/2019 Pathology Results   SURGICAL PATHOLOGY  CASE: [id number]  PATIENT: [name]  Surgical Pathology Report   Clinical History: Lymphoma; [name], LEFT PARA AORTIC, [name], cleaved nuclei and scant cytoplasm.  By immunohistochemistry, [name], [name], [name], [name] is low (less than 10%).  Flow cytometry was attempted; however, there was insufficient material for analysis (see [id number]).  Overall, the features are consistent with relapse of the patient's previously diagnosed follicular lymphoma.  Based on the biopsy, this is favored to be a low-grade follicular lymphoma   02/19/2019 -  Chemotherapy   The patient had idelisib for chemotherapy treatment.     05/17/2019 Imaging   1. Interval generalized decrease in abdominopelvic lymphadenopathy identified on the previous study. No new or progressive lymphadenopathy on today's study. 2. Moderate hiatal hernia. 3.  Emphysema (ICD10-J43.9) and Aortic Atherosclerosis (ICD10-170.0)   11/16/2019 Imaging   IMPRESSION: Chest Impression:   1. No mediastinal lymphadenopathy. 2. Post RIGHT lung wedge resection without evidence local recurrence.   Abdomen / Pelvis Impression:   1. Stable numerous small periaortic retroperitoneal  nodes and common iliac nodes. No progression of adenopathy. 2. No splenomegaly.  No skeletal metastasis.     07/31/2020 Imaging   1. Stable examination. No significant interval change in the prominent/mildly enlarged lymph nodes below the diaphragm. No thoracic adenopathy. No splenomegaly. 2. Postsurgical change of right lung wedge resection without evidence of local recurrence. 3. Large hiatal hernia. 4. Colonic diverticulosis without findings of acute diverticulitis. 5. Aortic Atherosclerosis (ICD10-I70.0) and Emphysema (ICD10-J43.9).     01/29/2021 Imaging   1. Overall mild interval enlargement of lymph nodes in the abdomen and pelvis as described. 2. No lymphadenopathy identified in the chest. 3. Emphysematous changes of the lungs. 4. Large hiatal hernia.  Extensive colonic diverticulosis. 5. Other ancillary findings as described.     04/30/2021 Imaging   Chest Impression:   1. No mediastinal lymphadenopathy. 2. Large hiatal hernia. 3. Retrocrural node slightly decreased in size.   Abdomen / Pelvis Impression:   1. Cluster of numerous small retroperitoneal  periaortic lymph nodes. Lymph nodes are decreased slightly in size compared to recent CT scan. 2. Likewise reduction in LEFT external iliac small lymph node. 3. No evidence of progression of lymphoma. 4. Normal spleen. 5. No bone involvement.   10/28/2021 Imaging   1. Overall trend towards progressive disease on today's exam. Lymph nodes in the hepatoduodenal ligament are upper normal for size but stable in the interval. Index retroperitoneal lymph nodes are progressive, notably in the right common iliac chain. Soft tissue nodule/lymph node in the retroperitoneal fat adjacent to the right iliacus muscle is progressive. 2. Large hiatal hernia. 3. Colonic diverticulosis without diverticulitis. 4. Prostatomegaly. 5. Aortic Atherosclerosis (ICD10-I70.0) and Emphysema (ICD10-J43.9).   03/10/2022 PET scan   Multiple intensely  avid and enlarged retroperitoneal lymph nodes consistent with patient's known history of lymphoma. Of note, some of these lymph nodes demonstrate interval growth when compared to most recent abdominal CT, for reference to the node adjacent to the right psoas musculature. For purposes of assessing transformation into a more aggressive form of lymphoma, SUV max calculated using total body weight is 10.5 for the largest right iliac node and 9.8 for the soft tissue implant posterior to the right psoas muscle.  - Similar focus of increased hypermetabolic to be within the right hilar region, which may represent mediastinal involvement versus reactive etiology. Attention on follow-up examinations.    03/24/2022 Procedure   PROCEDURE: The patient was placed in the prone position. Initial CT images of the pelvis were obtained, demonstrating enlarged pelvic lymph node.   An appropriate entry site was identified and marked on the skin. The right back was prepped and draped using all elements of maximal sterile barrier technique.  Local anesthesia was achieved with lidocaine.   Under intermittent CT guidance, a 18-G coaxial needle was inserted into right pelvic lymph node.  A total of 3 cores were obtained through the cannula using spring-loaded 18-G Corvocet biopsy device.    03/24/2022 Pathology Results   A: Lymph node, pelvis, right, biopsy -  Involved by follicular lymphoma (see Comment)   Special Stains: CD5, [name] and CD20 are performed by immunohistochemistry and/or in situ hybridization in addition to flow cytometry for histopathologic and immunophenotypic correlation.   Appropriate controls for each stain have been evaluated and stain as expected.   Sections of block A1 are stained.   CD3: CD3 stain is negative in neoplastic lymphocytes. CD20: CD20 stains neoplastic lymphocytes. CD21: CD21 stains follicular dendritic cells of enlarge neoplastic follicles. CD5: CD5 mirrors CD3 [name]: [name] stains  weakly neoplastic lymphocytes. Bcl-6: Bcl-6 stains neoplastic lymphocytes. Bcl-2: Bcl-2 stains neoplastic lymphocytes.  Cyclin-D1: Cyclin-D1 stains is negative in neoplastic lymphocytes. Ki-67: Ki-67 shows low proliferation rate in neoplastic lymphocytes (10%). GMS: GMS stain is negative for microorganisms AFB: AFB stain is negative for microorganisms   04/08/2022 -  Chemotherapy   Revlimid was started on 4/4 Patient is on Treatment Plan : LYMPHOMA Lenalidomide + Rituximab q28d        PHYSICAL EXAMINATION: ECOG PERFORMANCE STATUS: 1 - Symptomatic but completely ambulatory  Vitals:   04/29/22 0844  BP: 126/69  Pulse: 76  Resp: 19  Temp: 97.7 F (36.5 C)  SpO2: 99%   Filed Weights   04/29/22 0844  Weight: 188 lb 3.2 oz (85.4 kg)    GENERAL:alert, no distress and comfortable SKIN: his rash is gone NEURO: alert & oriented x 3 with fluent speech, no focal motor/sensory deficits  LABORATORY DATA:  I have reviewed the data as listed  Component Value Date/Time   NA 139 04/29/2022 0823   NA 139 01/28/2022 0822   NA 139 04/14/2016 0943   K 3.7 04/29/2022 0823   K 4.6 04/14/2016 0943   CL 102 04/29/2022 0823   CL 104 03/24/2012 1024   CO2 28 04/29/2022 0823   CO2 25 04/14/2016 0943   GLUCOSE 161 (H) 04/29/2022 0823   GLUCOSE 111 04/14/2016 0943   GLUCOSE 80 03/24/2012 1024   BUN 16 04/29/2022 0823   BUN 12 01/28/2022 0822   BUN 8.6 04/14/2016 0943   CREATININE 1.02 04/29/2022 0823   CREATININE 1.1 04/14/2016 0943   CALCIUM 9.0 04/29/2022 0823   CALCIUM 9.4 04/14/2016 0943   PROT 6.3 (L) 04/29/2022 0823   PROT 6.4 10/05/2021 1016   PROT 6.5 04/14/2016 0943   ALBUMIN 4.0 04/29/2022 0823   ALBUMIN 4.4 10/05/2021 1016   ALBUMIN 3.8 04/14/2016 0943   AST 23 04/29/2022 0823   AST 28 04/14/2016 0943   ALT 36 04/29/2022 0823   ALT 41 04/14/2016 0943   ALKPHOS 57 04/29/2022 0823   ALKPHOS 71 04/14/2016 0943   BILITOT 0.6 04/29/2022 0823   BILITOT 0.61 04/14/2016  0943   GFRNONAA >60 04/29/2022 0823   GFRAA 69 09/27/2019 0801    No results found for: "SPEP", "UPEP"  Lab Results  Component Value Date   WBC 9.7 04/29/2022   NEUTROABS 5.3 04/29/2022   HGB 15.1 04/29/2022   HCT 45.1 04/29/2022   MCV 88.8 04/29/2022   PLT 267 04/29/2022      Chemistry      Component Value Date/Time   NA 139 04/29/2022 0823   NA 139 01/28/2022 0822   NA 139 04/14/2016 0943   K 3.7 04/29/2022 0823   K 4.6 04/14/2016 0943   CL 102 04/29/2022 0823   CL 104 03/24/2012 1024   CO2 28 04/29/2022 0823   CO2 25 04/14/2016 0943   BUN 16 04/29/2022 0823   BUN 12 01/28/2022 0822   BUN 8.6 04/14/2016 0943   CREATININE 1.02 04/29/2022 0823   CREATININE 1.1 04/14/2016 0943      Component Value Date/Time   CALCIUM 9.0 04/29/2022 0823   CALCIUM 9.4 04/14/2016 0943   ALKPHOS 57 04/29/2022 0823   ALKPHOS 71 04/14/2016 0943   AST 23 04/29/2022 0823   AST 28 04/14/2016 0943   ALT 36 04/29/2022 0823   ALT 41 04/14/2016 0943   BILITOT 0.6 04/29/2022 0823   BILITOT 0.61 04/14/2016 1610

## 2022-04-29 NOTE — Progress Notes (Signed)
Pt with low HR and BP during first hour of rituxan. Note from previous RN states this happened during the last infusion. Pt is not symptomatic. MD aware and no orders given.

## 2022-04-29 NOTE — Assessment & Plan Note (Signed)
This has resolved as above I also spent some time completing paperwork to help him get permission to attend his driver seat window to avoid risk of skin cancer

## 2022-04-29 NOTE — Assessment & Plan Note (Signed)
His rash has resolved We will discontinue prednisone As above, once his current prescription Revlimid runs out, we will switch him over to lower dose Revlimid Plan to repeat imaging study in June We will proceed with rituximab as scheduled

## 2022-04-29 NOTE — Patient Instructions (Signed)
[facility]  Discharge Instructions: Thank you for choosing [facility] to provide your oncology and hematology care.   If you have a lab appointment with the Cancer Center, please go directly to the Cancer Center and check in at the registration area.   Wear comfortable clothing and clothing appropriate for easy access to any Portacath or PICC line.   We strive to give you quality time with your provider. You may need to reschedule your appointment if you arrive late (15 or more minutes).  Arriving late affects you and other patients whose appointments are after yours.  Also, if you miss three or more appointments without notifying the office, you may be dismissed from the clinic at the provider's discretion.      For prescription refill requests, have your pharmacy contact our office and allow 72 hours for refills to be completed.    Today you received the following chemotherapy and/or immunotherapy agents rituxan      To help prevent nausea and vomiting after your treatment, we encourage you to take your nausea medication as directed.  BELOW ARE SYMPTOMS THAT SHOULD BE REPORTED IMMEDIATELY: *FEVER GREATER THAN 100.4 F (38 C) OR HIGHER *CHILLS OR SWEATING *NAUSEA AND VOMITING THAT IS NOT CONTROLLED WITH YOUR NAUSEA MEDICATION *UNUSUAL SHORTNESS OF BREATH *UNUSUAL BRUISING OR BLEEDING *URINARY PROBLEMS (pain or burning when urinating, or frequent urination) *BOWEL PROBLEMS (unusual diarrhea, constipation, pain near the anus) TENDERNESS IN MOUTH AND THROAT WITH OR WITHOUT PRESENCE OF ULCERS (sore throat, sores in mouth, or a toothache) UNUSUAL RASH, SWELLING OR PAIN  UNUSUAL VAGINAL DISCHARGE OR ITCHING   Items with * indicate a potential emergency and should be followed up as soon as possible or go to the Emergency Department if any problems should occur.  Please show the CHEMOTHERAPY ALERT CARD or IMMUNOTHERAPY ALERT CARD at check-in  to the Emergency Department and triage nurse.  Should you have questions after your visit or need to cancel or reschedule your appointment, please contact [place] [facility]  Dept: [phone]  and follow the prompts.  Office hours are 8:00 a.m. to 4:30 p.m. Monday - Friday. Please note that voicemails left after 4:00 p.m. may not be returned until the following business day.  We are closed weekends and major holidays. You have access to a nurse at all times for urgent questions. Please call the main number to the clinic Dept: [phone] and follow the prompts.   For any non-urgent questions, you may also contact your provider using MyChart. We now offer e-Visits for anyone 54 and older to request care online for non-urgent symptoms. For details visit [email or web address].   Also download the MyChart app! Go to the app store, search "MyChart", open the app, select [place], and log in with your MyChart username and password.

## 2022-04-30 ENCOUNTER — Telehealth: Payer: Self-pay

## 2022-04-30 NOTE — Telephone Encounter (Addendum)
Called wife back. Anthony Kelly squatted down this am and felt light headed/ like he would pass out. He did not pass out and was able to stand back up. BP 122/70, HR 87 and no atrial fib per wife. He has had only the 1 episode. He is eating and drinking. Wife will get him to drink more oral fluids.  Wife is asking if Dr. Bertis Ruddy has any recommendations?

## 2022-04-30 NOTE — Telephone Encounter (Signed)
That is called vasovagal symptoms Not related to treatment Nothing for him to do differently

## 2022-04-30 NOTE — Telephone Encounter (Signed)
Called Anthony Kelly back and given below message. He verbalized understanding. He will stay hydrated and call the office back for questions.

## 2022-05-06 ENCOUNTER — Encounter: Payer: Self-pay | Admitting: Hematology and Oncology

## 2022-05-06 ENCOUNTER — Other Ambulatory Visit: Payer: Self-pay

## 2022-05-06 ENCOUNTER — Inpatient Hospital Stay: Payer: Medicare Other

## 2022-05-06 ENCOUNTER — Inpatient Hospital Stay: Payer: Medicare Other | Admitting: Hematology and Oncology

## 2022-05-06 VITALS — BP 100/62 | HR 56 | Resp 16 | Ht 72.0 in | Wt 189.8 lb

## 2022-05-06 DIAGNOSIS — C8280 Other types of follicular lymphoma, unspecified site: Secondary | ICD-10-CM

## 2022-05-06 DIAGNOSIS — L270 Generalized skin eruption due to drugs and medicaments taken internally: Secondary | ICD-10-CM | POA: Diagnosis not present

## 2022-05-06 DIAGNOSIS — Z5112 Encounter for antineoplastic immunotherapy: Secondary | ICD-10-CM | POA: Diagnosis not present

## 2022-05-06 DIAGNOSIS — C8288 Other types of follicular lymphoma, lymph nodes of multiple sites: Secondary | ICD-10-CM | POA: Diagnosis not present

## 2022-05-06 LAB — CBC WITH DIFFERENTIAL (CANCER CENTER ONLY)
Abs Immature Granulocytes: 0.05 10*3/uL (ref 0.00–0.07)
Basophils Absolute: 0.1 10*3/uL (ref 0.0–0.1)
Basophils Relative: 1 %
Eosinophils Absolute: 0.7 10*3/uL — ABNORMAL HIGH (ref 0.0–0.5)
Eosinophils Relative: 9 %
HCT: 44.7 % (ref 39.0–52.0)
Hemoglobin: 14.8 g/dL (ref 13.0–17.0)
Immature Granulocytes: 1 %
Lymphocytes Relative: 19 %
Lymphs Abs: 1.4 10*3/uL (ref 0.7–4.0)
MCH: 29.7 pg (ref 26.0–34.0)
MCHC: 33.1 g/dL (ref 30.0–36.0)
MCV: 89.8 fL (ref 80.0–100.0)
Monocytes Absolute: 1.2 10*3/uL — ABNORMAL HIGH (ref 0.1–1.0)
Monocytes Relative: 16 %
Neutro Abs: 4.1 10*3/uL (ref 1.7–7.7)
Neutrophils Relative %: 54 %
Platelet Count: 222 10*3/uL (ref 150–400)
RBC: 4.98 MIL/uL (ref 4.22–5.81)
RDW: 13.7 % (ref 11.5–15.5)
WBC Count: 7.5 10*3/uL (ref 4.0–10.5)
nRBC: 0 % (ref 0.0–0.2)

## 2022-05-06 LAB — CMP (CANCER CENTER ONLY)
ALT: 23 U/L (ref 0–44)
AST: 18 U/L (ref 15–41)
Albumin: 4 g/dL (ref 3.5–5.0)
Alkaline Phosphatase: 52 U/L (ref 38–126)
Anion gap: 6 (ref 5–15)
BUN: 15 mg/dL (ref 8–23)
CO2: 30 mmol/L (ref 22–32)
Calcium: 9.2 mg/dL (ref 8.9–10.3)
Chloride: 102 mmol/L (ref 98–111)
Creatinine: 1.02 mg/dL (ref 0.61–1.24)
GFR, Estimated: >60 mL/min (ref >60)
Glucose, Bld: 203 mg/dL — ABNORMAL HIGH (ref 70–99)
Potassium: 4.4 mmol/L (ref 3.5–5.1)
Sodium: 138 mmol/L (ref 135–145)
Total Bilirubin: 0.6 mg/dL (ref 0.3–1.2)
Total Protein: 6.3 g/dL — ABNORMAL LOW (ref 6.5–8.1)

## 2022-05-06 MED ORDER — LENALIDOMIDE 15 MG PO CAPS: 15.0000 mg | ORAL_CAPSULE | Freq: Every day | ORAL | 0 refills | Status: DC

## 2022-05-06 MED ORDER — MONTELUKAST SODIUM 10 MG PO TABS
10.0000 mg | ORAL_TABLET | Freq: Once | ORAL | Status: AC
  Administered 2022-05-06: 10 mg via ORAL
  Filled 2022-05-06: qty 1

## 2022-05-06 MED ORDER — DIPHENHYDRAMINE HCL 25 MG PO CAPS
25.0000 mg | ORAL_CAPSULE | Freq: Once | ORAL | Status: AC
  Administered 2022-05-06: 25 mg via ORAL
  Filled 2022-05-06: qty 1

## 2022-05-06 MED ORDER — ACETAMINOPHEN 325 MG PO TABS
650.0000 mg | ORAL_TABLET | Freq: Once | ORAL | Status: AC
  Administered 2022-05-06: 650 mg via ORAL
  Filled 2022-05-06: qty 2

## 2022-05-06 MED ORDER — FAMOTIDINE IN NACL 20-0.9 MG/50ML-% IV SOLN
20.0000 mg | Freq: Once | INTRAVENOUS | Status: AC
  Administered 2022-05-06: 20 mg via INTRAVENOUS
  Filled 2022-05-06: qty 50

## 2022-05-06 MED ORDER — SODIUM CHLORIDE 0.9 % IV SOLN
375.0000 mg/m2 | Freq: Once | INTRAVENOUS | Status: AC
  Administered 2022-05-06: 800 mg via INTRAVENOUS
  Filled 2022-05-06: qty 50

## 2022-05-06 MED ORDER — SODIUM CHLORIDE 0.9 % IV SOLN: Freq: Once | INTRAVENOUS | Status: AC

## 2022-05-06 NOTE — Patient Instructions (Signed)
[facility]  Discharge Instructions: Thank you for choosing [facility] to provide your oncology and hematology care.   If you have a lab appointment with the Cancer Center, please go directly to the Cancer Center and check in at the registration area.   Wear comfortable clothing and clothing appropriate for easy access to any Portacath or PICC line.   We strive to give you quality time with your provider. You may need to reschedule your appointment if you arrive late (15 or more minutes).  Arriving late affects you and other patients whose appointments are after yours.  Also, if you miss three or more appointments without notifying the office, you may be dismissed from the clinic at the provider's discretion.      For prescription refill requests, have your pharmacy contact our office and allow 72 hours for refills to be completed.    Today you received the following chemotherapy and/or immunotherapy agents rituxan      To help prevent nausea and vomiting after your treatment, we encourage you to take your nausea medication as directed.  BELOW ARE SYMPTOMS THAT SHOULD BE REPORTED IMMEDIATELY: *FEVER GREATER THAN 100.4 F (38 C) OR HIGHER *CHILLS OR SWEATING *NAUSEA AND VOMITING THAT IS NOT CONTROLLED WITH YOUR NAUSEA MEDICATION *UNUSUAL SHORTNESS OF BREATH *UNUSUAL BRUISING OR BLEEDING *URINARY PROBLEMS (pain or burning when urinating, or frequent urination) *BOWEL PROBLEMS (unusual diarrhea, constipation, pain near the anus) TENDERNESS IN MOUTH AND THROAT WITH OR WITHOUT PRESENCE OF ULCERS (sore throat, sores in mouth, or a toothache) UNUSUAL RASH, SWELLING OR PAIN  UNUSUAL VAGINAL DISCHARGE OR ITCHING   Items with * indicate a potential emergency and should be followed up as soon as possible or go to the Emergency Department if any problems should occur.  Please show the CHEMOTHERAPY ALERT CARD or IMMUNOTHERAPY ALERT CARD at check-in  to the Emergency Department and triage nurse.  Should you have questions after your visit or need to cancel or reschedule your appointment, please contact [facility] [facility]  Dept: [phone]  and follow the prompts.  Office hours are 8:00 a.m. to 4:30 p.m. Monday - Friday. Please note that voicemails left after 4:00 p.m. may not be returned until the following business day.  We are closed weekends and major holidays. You have access to a nurse at all times for urgent questions. Please call the main number to the clinic Dept: [phone] and follow the prompts.   For any non-urgent questions, you may also contact your provider using MyChart. We now offer e-Visits for anyone 18 and older to request care online for non-urgent symptoms. For details visit [email or web address].   Also download the MyChart app! Go to the app store, search "MyChart", open the app, select [place], and log in with your MyChart username and password.   

## 2022-05-10 DIAGNOSIS — H00025 Hordeolum internum left lower eyelid: Secondary | ICD-10-CM | POA: Diagnosis not present

## 2022-05-11 ENCOUNTER — Encounter: Payer: Self-pay | Admitting: Hematology and Oncology

## 2022-05-17 ENCOUNTER — Encounter: Payer: Self-pay | Admitting: Hematology and Oncology

## 2022-05-17 ENCOUNTER — Telehealth: Payer: Self-pay | Admitting: Internal Medicine

## 2022-05-17 NOTE — Telephone Encounter (Signed)
Pt wife Anthony Kelly stated that pt started having N/V, diarrhea, and abdominal pain this AM. No temp. One episode of emesis this am, 2 episodes of diarrhea, along with abdominal cramping mid bottom stomach. Pt stated that he is taking Revlimid . On his second cycle now which started may 2nd. Pt had first 21 day  cycle with no issues and was off for 7 days. Pt stated that the abdominal cramping is getting better  as the day progressed. Pt did notify cancer Dr and they requested that pt notify his GI Dr.  [name] was notified that I would call in the AM for a symptom update  Please advise if any recommendations

## 2022-05-17 NOTE — Telephone Encounter (Signed)
PT is having severe pain in lower center portion of stomach along with nausea and diarrhea. He is a cancer patient and they feel it could be of the medication he take Revlimid. Please advise.

## 2022-05-17 NOTE — Telephone Encounter (Signed)
Agree w/ plan - this is a possible side effect of the medication so that would need to be discussed w/ oncology Dr. Bertis Ruddy  He has ondansetron Rx so should take that if needed and can tolerate (notes say causes headaches)

## 2022-05-18 ENCOUNTER — Encounter: Payer: Self-pay | Admitting: Cardiovascular Disease

## 2022-05-18 ENCOUNTER — Ambulatory Visit: Payer: Medicare Other | Attending: Cardiovascular Disease | Admitting: Cardiovascular Disease

## 2022-05-18 VITALS — BP 118/60 | HR 77 | Ht 72.0 in | Wt 189.0 lb

## 2022-05-18 DIAGNOSIS — I480 Paroxysmal atrial fibrillation: Secondary | ICD-10-CM | POA: Insufficient documentation

## 2022-05-18 NOTE — Progress Notes (Signed)
Cardiology Office Note:    Date:  05/18/2022   ID:  Anthony Kelly, DOB 02/05/1945, MRN [id number]  PCP:  Dettinger, Elige Radon, MD   [facility] Providers Cardiologist:  Dina Rich, MD Electrophysiologist:  Maurice Small, MD     Referring MD: Dettinger, Elige Radon, MD    History of Present Illness:    Anthony Kelly is a 77 y.o. male with a hx of CAD, Lymphoma, NSVT and PAF who follows-up in electrophysiology clinic for arrhythmia management.  He was last seen by Dr. Johney Frame in September of 2022. At that time, he reported that he was doing well -- not having any palpitations on flecainide.  Today, he reports that he is doing quite well. He has occasional palpitations, but it's nothing compared to when he was initially receiving chemotherapy. He uses his apple watch to capture an ECG when he has palpitations. I reviewed four or five strips. They all showed sinus rhythm with sinus arrhythmia except one, which showed a supraventricular couplet.  11/30/2021 At our last visit, we discontinued flecainide and placed a monitor.  The monitor showed frequent PACs and occasional runs of atrial tachycardia.  Since wearing the monitor, he increase his activity level and received 3 A-fib notifications from his Apple Watch.  I reviewed these today and they do in fact show atrial fibrillation.  He has had symptoms of palpitations that correspond with documented arrhythmia.  Otherwise, he reports that he has no complaints - no chest pain, syncope, presyncope, shortness of breath.    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 04/2013 -adm with CP, elevated CRP. H/o coronary artery calcification on prior CT but nuc was negative, EF 71%.   Arthritis    Atrial fibrillation (HCC)    a. Isolated episode in the setting of acute pericarditis 04/2013. Was not placed on anticoag.   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   Metastasis to lung (HCC) dx'd 01/2007   Metastasis to lymph nodes (HCC) dx'd 03/2011   lt submandibular ln   Pain in joint, pelvic region and thigh 08/01/2013   PSVT (paroxysmal supraventricular tachycardia)    SCC (squamous cell carcinoma)    Left cheek    Past Surgical History:  Procedure Laterality Date   ATRIAL FIBRILLATION ABLATION N/A 02/17/2022   Procedure: ATRIAL FIBRILLATION ABLATION;  Surgeon: Maurice Small, MD;  Location: [facility] INVASIVE CV LAB;  Service: Cardiovascular;  Laterality: N/A;   CHOLECYSTECTOMY     EXPLORATORY LAPAROTOMY     EYE SURGERY Right 2016   cataract   LEFT HEART CATH AND CORONARY ANGIOGRAPHY N/A 06/05/2019   Procedure: LEFT HEART CATH AND CORONARY ANGIOGRAPHY;  Surgeon: Corky Crafts, MD;  Location: [facility] INVASIVE CV LAB;  Service: Cardiovascular;  Laterality: N/A;   LUNG LOBECTOMY     right side   LYMPH NODE BIOPSY     in groin  with removal   MENISCUS REPAIR     right knee   MOHS SURGERY Left 09/2020   cheek   PROSTATE SURGERY     REFRACTIVE SURGERY Right    piece of metal removed   SUBMANDIBULAR GLAND EXCISION  04/2011   SUBMANDIBULAR GLAND EXCISION  04/30/2011   Procedure: EXCISION SUBMANDIBULAR GLAND;  Surgeon: Osborn Coho, MD;  Location: [facility] OR;  Service: ENT;  Laterality: Left;  WITH DIAGNOSTIC BIOPSY   TONSILLECTOMY     as a child   VEIN LIGATION AND STRIPPING     right leg    Current Medications: Current Meds  Medication Sig    acyclovir (ZOVIRAX) 400 MG tablet Take 1 tablet (400 mg total) by mouth daily.   amoxicillin-clavulanate (AUGMENTIN) 500-125 MG tablet    apixaban (ELIQUIS) 5 MG TABS tablet TAKE 1 TABLET TWICE A DAY   Ascorbic Acid (VITAMIN C) 1000 MG tablet Take 1,000 mg by mouth daily.   brimonidine (ALPHAGAN) 0.2 % ophthalmic solution Place 1 drop into both eyes 2 (two) times daily.    Calcium Carbonate-Vit D-Min (CALCIUM 1200 PO) Take 1,200 mg by mouth daily.   cyclobenzaprine (FLEXERIL) 5 MG tablet Take 1 tablet (5 mg total) by mouth 3 (three) times daily as needed for muscle spasms.   finasteride (PROSCAR) 5 MG tablet Take 1 tablet (5 mg total) by mouth daily.   lenalidomide (REVLIMID) 15 MG capsule Take 1 capsule (15 mg total) by mouth daily. Take 15 mg daily, take for 21 days on, 7 days off, repeat every 28 days.   Lifitegrast (XIIDRA) 5 % SOLN Apply 1 drop to eye 2 (two) times daily.   LUMIGAN 0.01 % SOLN Place 1 drop into both eyes at bedtime.   meclizine (ANTIVERT) 25 MG tablet Take 1 tablet (25 mg total) by mouth 3 (three) times daily as needed for dizziness.   metoprolol succinate (TOPROL-XL) 25 MG 24 hr tablet Take 0.5 tablets (12.5 mg total) by mouth 2 (two) times daily.   mometasone (ELOCON) 0.1 % cream Apply 1 Application topically 2 (two) times daily as needed (eczema).   Multiple Vitamin (MULTIVITAMIN WITH MINERALS) TABS tablet Take 1 tablet by mouth daily.    omeprazole (PRILOSEC OTC) 20 MG tablet Take 20 mg by mouth every other day.   ondansetron (ZOFRAN) 8 MG tablet Take 1 tablet (8 mg total) by mouth every 8 (eight) hours as needed for nausea or vomiting.   Polyethyl Glycol-Propyl Glycol (SYSTANE OP) Place 1 drop into both eyes daily as needed (dry eyes).   Probiotic Product (SUPERIOR PROBIOTIC) CAPS Take 1 capsule by mouth daily.   prochlorperazine (COMPAZINE) 10 MG tablet Take 1 tablet (10 mg total) by mouth every 6 (six) hours as needed for nausea or vomiting.   rosuvastatin (CRESTOR)  20 MG tablet Take 1 tablet (20 mg total) by mouth daily.   silodosin (RAPAFLO) 8 MG CAPS capsule Take 1 capsule (8 mg total) by mouth daily with breakfast.     Allergies:   Rituximab   Social History   Socioeconomic History   Marital status: Married    Spouse name: Kendal Hymen   Number of children: 2   Years of education: GED   Highest education level: GED or equivalent  Occupational History   Occupation: Retired     Comment: Multimedia programmer   Occupation: Retired     Comment: [employer]  Tobacco Use   Smoking status: Former    Packs/day: 1.00    Years:  15.00    Additional pack years: 0.00    Total pack years: 15.00    Types: Cigarettes    Start date: 12/12/1962    Quit date: 08/25/1984    Years since quitting: 37.7   Smokeless tobacco: Never  Vaping Use   Vaping Use: Never used  Substance and Sexual Activity   Alcohol use: Not Currently   Drug use: Never   Sexual activity: Not Currently  Other Topics Concern   Not on file  Social History Narrative   Right handed   Decaf coffee (2-3 per day)   Lives with wife   Social Determinants of Health   Financial Resource Strain: Low Risk  (02/26/2022)   Overall Financial Resource Strain (CARDIA)    Difficulty of Paying Living Expenses: Not hard at all  Food Insecurity: No Food Insecurity (02/26/2022)   Hunger Vital Sign    Worried About Running Out of Food in the Last Year: Never true    Ran Out of Food in the Last Year: Never true  Transportation Needs: No Transportation Needs (02/26/2022)   PRAPARE - Administrator, Civil Service (Medical): No    Lack of Transportation (Non-Medical): No  Physical Activity: Insufficiently Active (02/26/2022)   Exercise Vital Sign    Days of Exercise per Week: 3 days    Minutes of Exercise per Session: 30 min  Stress: No Stress Concern Present (02/26/2022)   [employer] of Occupational Health - Occupational Stress Questionnaire    Feeling of Stress : Not at all  Social  Connections: Moderately Integrated (02/26/2022)   Social Connection and Isolation Panel [NHANES]    Frequency of Communication with Friends and Family: More than three times a week    Frequency of Social Gatherings with Friends and Family: More than three times a week    Attends Religious Services: More than 4 times per year    Active Member of [employer] or Organizations: No    Attends Engineer, structural: Never    Marital Status: Married     Family History: The patient's family history includes Alzheimer's disease in his brother and sister; Cancer in his brother, brother, father, sister, and sister; Diabetes in his brother, sister, and son; Heart attack in his father and sister; Heart disease in his brother, brother, brother, brother, brother, brother, brother, and father. There is no history of Anesthesia problems.  ROS:   Please see the history of present illness.    All other systems reviewed and are negative.  EKGs/Labs/Other Studies Reviewed:     TTE 8/32/2022 EF 60-65%. Normal structure and function with mild LVH  Coronary angiogram 06/05/2019: Prox RCA lesion is 25% stenosed. Ramus lesion is 50% stenosed. Prox LAD to Mid LAD lesion is 25% stenosed. Severe right subclavian tortuosity made torquing catheters difficult. If cath was needed in the future, would consider alternate access site. The left ventricular systolic function is normal. LV end diastolic pressure is normal. The left ventricular ejection fraction is 55-65% by visual estimate. There is no aortic valve stenosis.  7 day Patch monitor:   Occasional supraventricular ectopy in the form of isolated PACs, couplets. Rare triplets. 54 runs of SVT longest 19.2 seconds.   Rare ventricular ectopy in the form of isolated PVCs   Reported symptoms correlated with PACs and short runs of SVT. No AF detected  EKG:  Last EKG results: today - sinus rhythm; rhythm strip shows frequent PACS   Recent Labs: 05/06/2022: ALT  23; BUN  15; Creatinine 1.02; Hemoglobin 14.8; Platelet Count 222; Potassium 4.4; Sodium 138    Risk Assessment/Calculations:    CHA2DS2-VASc Score = 3   This indicates a 3.2% annual risk of stroke. The patient's score is based upon: CHF History: 0 HTN History: 0 Diabetes History: 0 Stroke History: 0 Vascular Disease History: 1 Age Score: 2 Gender Score: 0         Physical Exam:    VS:  BP 118/60   Pulse 77   Ht 6' (1.829 m)   Wt 189 lb (85.7 kg)   SpO2 92%   BMI 25.63 kg/m     Wt Readings from Last 3 Encounters:  05/18/22 189 lb (85.7 kg)  05/06/22 189 lb 12.8 oz (86.1 kg)  04/29/22 188 lb 3.2 oz (85.4 kg)     GEN:  Well nourished, well developed in no acute distress CARDIAC: RRR, no murmurs, rubs, gallops RESPIRATORY:  Normal work of breathing MUSCULOSKELETAL: no edema    ASSESSMENT & PLAN:    Paroxysmal atrial fibrillation: Failed flecainide.  Status post ablation on February 17, 2022.  He has been undergoing chemotherapy for lymphoma.  He did have a recurrence of atrial fibrillation once or twice after the ablation but has since remained in sinus rhythm. Palpitations -- PACs, PVCs CAD: mild, non-hemodynamically significant CAD on cath from 2021. Increases risk of malignant, life threatening arrhythmia from flecainide - I did inform the patient of this. Lymphoma   Currently under care of Dr. Bertis Ruddy    Follow-up 6 months  Medication Adjustments/Labs and Tests Ordered: Current medicines are reviewed at length with the patient today.  Concerns regarding medicines are outlined above.  No orders of the defined types were placed in this encounter.  No orders of the defined types were placed in this encounter.    Signed, Maurice Small, MD  05/18/2022 4:10 PM    [facility]

## 2022-05-18 NOTE — Patient Instructions (Signed)
Medication Instructions:  Your physician recommends that you continue on your current medications as directed. Please refer to the Current Medication list given to you today. *If you need a refill on your cardiac medications before your next appointment, please call your pharmacy*   Follow-Up: At [facility], you and your health needs are our priority.  As part of our continuing mission to provide you with exceptional heart care, we have created designated Provider Care Teams.  These Care Teams include your primary Cardiologist (physician) and Advanced Practice Providers (APPs -  Physician Assistants and Nurse Practitioners) who all work together to provide you with the care you need, when you need it.  We recommend signing up for the patient portal called "MyChart".  Sign up information is provided on this After Visit Summary.  MyChart is used to connect with patients for Virtual Visits (Telemedicine).  Patients are able to view lab/test results, encounter notes, upcoming appointments, etc.  Non-urgent messages can be sent to your provider as well.   To learn more about what you can do with MyChart, go to [email or web address].    Your next appointment:   6 month(s)  Provider:   Augustus Mealor, MD  

## 2022-05-18 NOTE — Telephone Encounter (Signed)
Pt was contacted for symptom update. Pt stated that he is feeling much better today. No nausea, diarrhea or pain today.  Pt stated that last GI symptoms were yesterday morning.

## 2022-05-19 ENCOUNTER — Encounter: Payer: Self-pay | Admitting: Hematology and Oncology

## 2022-06-02 ENCOUNTER — Encounter: Payer: Self-pay | Admitting: Hematology and Oncology

## 2022-06-02 ENCOUNTER — Other Ambulatory Visit: Payer: Self-pay | Admitting: Hematology and Oncology

## 2022-06-02 ENCOUNTER — Other Ambulatory Visit: Payer: Self-pay

## 2022-06-02 MED ORDER — LENALIDOMIDE 15 MG PO CAPS: ORAL_CAPSULE | ORAL | 0 refills | Status: DC

## 2022-06-03 ENCOUNTER — Inpatient Hospital Stay: Payer: Medicare Other | Attending: Hematology and Oncology

## 2022-06-03 ENCOUNTER — Encounter: Payer: Self-pay | Admitting: Hematology and Oncology

## 2022-06-03 ENCOUNTER — Inpatient Hospital Stay: Payer: Medicare Other

## 2022-06-03 ENCOUNTER — Inpatient Hospital Stay (HOSPITAL_BASED_OUTPATIENT_CLINIC_OR_DEPARTMENT_OTHER): Payer: Medicare Other | Admitting: Hematology and Oncology

## 2022-06-03 VITALS — BP 101/60 | HR 58 | Temp 97.6°F | Resp 16 | Ht 72.0 in | Wt 190.8 lb

## 2022-06-03 DIAGNOSIS — C8280 Other types of follicular lymphoma, unspecified site: Secondary | ICD-10-CM

## 2022-06-03 DIAGNOSIS — C8288 Other types of follicular lymphoma, lymph nodes of multiple sites: Secondary | ICD-10-CM | POA: Insufficient documentation

## 2022-06-03 DIAGNOSIS — Z5112 Encounter for antineoplastic immunotherapy: Secondary | ICD-10-CM | POA: Insufficient documentation

## 2022-06-03 LAB — CMP (CANCER CENTER ONLY)
ALT: 17 U/L (ref 0–44)
AST: 15 U/L (ref 15–41)
Albumin: 4 g/dL (ref 3.5–5.0)
Alkaline Phosphatase: 55 U/L (ref 38–126)
Anion gap: 7 (ref 5–15)
BUN: 13 mg/dL (ref 8–23)
CO2: 30 mmol/L (ref 22–32)
Calcium: 9 mg/dL (ref 8.9–10.3)
Chloride: 105 mmol/L (ref 98–111)
Creatinine: 0.93 mg/dL (ref 0.61–1.24)
GFR, Estimated: >60 mL/min (ref >60)
Glucose, Bld: 129 mg/dL — ABNORMAL HIGH (ref 70–99)
Potassium: 4 mmol/L (ref 3.5–5.1)
Sodium: 142 mmol/L (ref 135–145)
Total Bilirubin: 0.8 mg/dL (ref 0.3–1.2)
Total Protein: 6.1 g/dL — ABNORMAL LOW (ref 6.5–8.1)

## 2022-06-03 LAB — CBC WITH DIFFERENTIAL (CANCER CENTER ONLY)
Abs Immature Granulocytes: 0.01 10*3/uL (ref 0.00–0.07)
Basophils Absolute: 0.1 10*3/uL (ref 0.0–0.1)
Basophils Relative: 1 %
Eosinophils Absolute: 1.2 10*3/uL — ABNORMAL HIGH (ref 0.0–0.5)
Eosinophils Relative: 16 %
HCT: 43.4 % (ref 39.0–52.0)
Hemoglobin: 14.6 g/dL (ref 13.0–17.0)
Immature Granulocytes: 0 %
Lymphocytes Relative: 18 %
Lymphs Abs: 1.3 10*3/uL (ref 0.7–4.0)
MCH: 29.6 pg (ref 26.0–34.0)
MCHC: 33.6 g/dL (ref 30.0–36.0)
MCV: 88 fL (ref 80.0–100.0)
Monocytes Absolute: 1 10*3/uL (ref 0.1–1.0)
Monocytes Relative: 14 %
Neutro Abs: 3.6 10*3/uL (ref 1.7–7.7)
Neutrophils Relative %: 51 %
Platelet Count: 153 10*3/uL (ref 150–400)
RBC: 4.93 MIL/uL (ref 4.22–5.81)
RDW: 13.5 % (ref 11.5–15.5)
WBC Count: 7.2 10*3/uL (ref 4.0–10.5)
nRBC: 0 % (ref 0.0–0.2)

## 2022-06-03 MED ORDER — MONTELUKAST SODIUM 10 MG PO TABS
10.0000 mg | ORAL_TABLET | Freq: Once | ORAL | Status: AC
  Administered 2022-06-03: 10 mg via ORAL
  Filled 2022-06-03: qty 1

## 2022-06-03 MED ORDER — FAMOTIDINE 20 MG IN NS 100 ML IVPB
20.0000 mg | Freq: Once | INTRAVENOUS | Status: AC
  Administered 2022-06-03: 20 mg via INTRAVENOUS
  Filled 2022-06-03: qty 100

## 2022-06-03 MED ORDER — DIPHENHYDRAMINE HCL 25 MG PO CAPS
25.0000 mg | ORAL_CAPSULE | Freq: Once | ORAL | Status: AC
  Administered 2022-06-03: 25 mg via ORAL
  Filled 2022-06-03: qty 1

## 2022-06-03 MED ORDER — SODIUM CHLORIDE 0.9 % IV SOLN
375.0000 mg/m2 | Freq: Once | INTRAVENOUS | Status: AC
  Administered 2022-06-03: 800 mg via INTRAVENOUS
  Filled 2022-06-03: qty 50

## 2022-06-03 MED ORDER — SODIUM CHLORIDE 0.9 % IV SOLN: Freq: Once | INTRAVENOUS | Status: AC

## 2022-06-03 MED ORDER — ACETAMINOPHEN 325 MG PO TABS
650.0000 mg | ORAL_TABLET | Freq: Once | ORAL | Status: AC
  Administered 2022-06-03: 650 mg via ORAL
  Filled 2022-06-03: qty 2

## 2022-06-03 NOTE — Progress Notes (Signed)
[facility] OFFICE PROGRESS NOTE  Patient Care Team: Dettinger, Elige Radon, MD as PCP - General (Family Medicine) Wyline Mood, Dorothe Pea, MD as PCP - Cardiology (Cardiology) Mealor, Roberts Gaudy, MD as PCP - Electrophysiology (Cardiology) Iva Boop, MD as Consulting Physician (Gastroenterology) Artis Delay, MD as Consulting Physician (Hematology and Oncology) Cherlyn Roberts, MD as Consulting Physician (Dermatology)  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HCC) His rash has resolved He tolerated lower dose of Revlimid better Plan to repeat imaging study in June We will proceed with rituximab as scheduled  Orders Placed This Encounter  Procedures   CT CHEST ABDOMEN PELVIS W CONTRAST    Standing Status:   Future    Standing Expiration Date:   06/03/2023    Scheduling Instructions:     No oral contrast    Order Specific Question:   If indicated for the ordered procedure, I authorize the administration of contrast media per Radiology protocol    Answer:   Yes    Order Specific Question:   Does the patient have a contrast media/X-ray dye allergy?    Answer:   No    Order Specific Question:   Preferred imaging location?    Answer:   [facility]    Order Specific Question:   If indicated for the ordered procedure, I authorize the administration of oral contrast media per Radiology protocol    Answer:   Yes    All questions were answered. The patient knows to call the clinic with any problems, [name], discussions about plan of care and coordination of care plan   Artis Delay, MD 06/03/2022 9:56 AM  INTERVAL HISTORY: Please see below for problem oriented charting. he returns for treatment follow-up with his wife He tolerated recent reduced dose Revlimid well Denies skin rash or skin itching No  recent infection We discussed timing of next imaging study  REVIEW OF SYSTEMS:   Constitutional: Denies fevers, chills or abnormal weight loss Eyes: Denies blurriness of vision Ears, nose, mouth, throat, and face: Denies mucositis or sore throat Respiratory: Denies cough, dyspnea or wheezes Cardiovascular: Denies palpitation, chest discomfort or lower extremity swelling Gastrointestinal:  Denies nausea, heartburn or change in bowel habits Skin: Denies abnormal skin rashes Lymphatics: Denies new lymphadenopathy or easy bruising Neurological:Denies numbness, tingling or new weaknesses Behavioral/Psych: Mood is stable, no new changes  All other systems were reviewed with the patient and are negative.  I have reviewed the past medical history, past surgical history, social history and family history with the patient and they are unchanged from previous note.  ALLERGIES:  is allergic to rituximab.  MEDICATIONS:  Current Outpatient Medications  Medication Sig Dispense Refill   acyclovir (ZOVIRAX) 400 MG tablet Take 1 tablet (400 mg total) by mouth daily. 90 tablet 1   amoxicillin-clavulanate (AUGMENTIN) 500-125 MG tablet      apixaban (ELIQUIS) 5 MG TABS tablet TAKE 1 TABLET TWICE A DAY 180 tablet 1   Ascorbic Acid (VITAMIN C) 1000 MG tablet Take 1,000 mg by mouth daily.     brimonidine (ALPHAGAN) 0.2 % ophthalmic solution Place 1 drop into both eyes 2 (two) times daily.      Calcium Carbonate-Vit D-Min (CALCIUM 1200 PO) Take 1,200 mg by mouth daily.     cyclobenzaprine (FLEXERIL) 5 MG tablet Take 1 tablet (5 mg total)  by mouth 3 (three) times daily as needed for muscle spasms. 30 tablet 0   finasteride (PROSCAR) 5 MG tablet Take 1 tablet (5 mg total) by mouth daily. 90 tablet 3   lenalidomide (REVLIMID) 15 MG capsule TAKE 1 CAPSULE DAILY FOR 21 DAYS ON, THEN 7 DAYS OFF, REPEAT EVERY 28 DAYS 21 capsule 0   Lifitegrast (XIIDRA) 5 % SOLN Apply 1 drop to eye 2 (two) times daily.     LUMIGAN  0.01 % SOLN Place 1 drop into both eyes at bedtime.     meclizine (ANTIVERT) 25 MG tablet Take 1 tablet (25 mg total) by mouth 3 (three) times daily as needed for dizziness. 30 tablet 0   metoprolol succinate (TOPROL-XL) 25 MG 24 hr tablet Take 0.5 tablets (12.5 mg total) by mouth 2 (two) times daily. 90 tablet 3   mometasone (ELOCON) 0.1 % cream Apply 1 Application topically 2 (two) times daily as needed (eczema).     Multiple Vitamin (MULTIVITAMIN WITH MINERALS) TABS tablet Take 1 tablet by mouth daily.      omeprazole (PRILOSEC OTC) 20 MG tablet Take 20 mg by mouth every other day.     ondansetron (ZOFRAN) 8 MG tablet Take 1 tablet (8 mg total) by mouth every 8 (eight) hours as needed for nausea or vomiting. 30 tablet 1   Polyethyl Glycol-Propyl Glycol (SYSTANE OP) Place 1 drop into both eyes daily as needed (dry eyes).     Probiotic Product (SUPERIOR PROBIOTIC) CAPS Take 1 capsule by mouth daily.     prochlorperazine (COMPAZINE) 10 MG tablet Take 1 tablet (10 mg total) by mouth every 6 (six) hours as needed for nausea or vomiting. 30 tablet 1   rosuvastatin (CRESTOR) 20 MG tablet Take 1 tablet (20 mg total) by mouth daily. 90 tablet 3   silodosin (RAPAFLO) 8 MG CAPS capsule Take 1 capsule (8 mg total) by mouth daily with breakfast. 90 capsule 3   No current facility-administered medications for this visit.   Facility-Administered Medications Ordered in Other Visits  Medication Dose Route Frequency Provider Last Rate Last Admin   famotidine (PEPCID) IVPB 20 mg in NS 100 mL IVPB  20 mg Intravenous Once Bertis Ruddy, Elmira Olkowski, MD 400 mL/hr at 06/03/22 0947 20 mg at 06/03/22 0947   riTUXimab-pvvr (RUXIENCE) 800 mg in sodium chloride 0.9 % 250 mL (2.4242 mg/mL) infusion  375 mg/m2 (Treatment Plan Recorded) Intravenous Once Artis Delay, MD        SUMMARY OF ONCOLOGIC HISTORY: Oncology History Overview Note  Low grade B-cell lymphoma   Primary site: Lymphoid Neoplasms   Staging method: AJCC 6th Edition    Clinical: Stage IV signed by Artis Delay, MD on 09/26/2013  9:15 AM   Summary: Stage IV      Follicular low grade B-cell lymphoma (HCC)  12/10/2003 Surgery   Inguinal lymph node biopsy showed follicular lymphoma.   12/12/2003 - 06/01/2004 Chemotherapy   He was treated with R. CHOP chemotherapy which show complete remission. The number of cycles of R. CHOP chemotherapy was unknown.   12/19/2006 Surgery   Lung resection show follicular lymphoma.   01/02/2007 - 09/01/2008 Chemotherapy   The patient was treated with single agent rituximab alone.   01/19/2007 Bone Marrow Biopsy   Bone marrow biopsy was negative.   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   05/08/2013 - 05/11/2013 Hospital Admission   The patient was admitted to the hospital for management of pericarditis. CT scan showed  extensive lymphadenopathy.   06/07/2013 Imaging   PET/CT scan showed extensive lymphadenopathy   06/25/2013 Procedure   He has placement of Infuse-a-Port.   06/28/2013 Bone Marrow Biopsy   Bone marrow biopsy is positive for lymphoma involvement.   07/04/2013 - 11/22/2013 Chemotherapy   He is treated with 6 cycles of bendamustine with rituximab.   09/24/2013 Imaging   Repeat PET scan show complete remission.   12/26/2013 Imaging   PEt scan showed complete remission   09/26/2014 Imaging   CT scan of the chest abdomen and pelvis show no evidence of disease   03/26/2015 Imaging   CT scan showed no evidence of lymphoma   04/14/2016 Imaging   CT: Borderline prominent right hilar and subcarinal lymph nodes, but not appreciably changed. 2. Low-grade but increased central mesenteric stranding with some small mesenteric lymph nodes. This could certainly be inflammatory, and there is no bulky adenopathy to suggest a malignant etiology.  3. Coronary and aortoiliac atherosclerotic calcification. 4. Centrilobular and paraseptal emphysema. Postoperative findings in the right  lung. 5. 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, much of which is likely a physiologic response to cholecystectomy. 7. Sigmoid colon diverticulosis. 8.  Prominent stool throughout the colon favors constipation. 9. Enlarged prostate gland, volume estimated at 75 cubic cm.   02/15/2017 Imaging   No evidence of recurrent lymphoma or other acute findings.  Stable moderate hiatal hernia.  Colonic diverticulosis, without radiographic evidence of diverticulitis.  Stable mildly enlarged prostate.  Mild emphysema.  Aortic and coronary artery atherosclerosis.   01/31/2019 Imaging   1. Interval development of abdominal and pelvic adenopathy compatible with recurrent lymphoma. 2. Emphysema and aortic atherosclerosis. 3. Multi vessel coronary artery calcifications. 4. Moderate to large hiatal hernia   02/09/2019 Procedure   Successful CT-guided core biopsy of the left retroperitoneal periaortic adenopathy   02/09/2019 Pathology Results   SURGICAL PATHOLOGY  CASE: [id number]  PATIENT: Anthony Kelly  Surgical Pathology Report   Clinical History: Lymphoma; [name], LEFT PARA AORTIC, [name], cleaved nuclei and scant cytoplasm.  By immunohistochemistry, [name], [name], [name], [name] is low (less than 10%).  Flow cytometry was attempted; however, there was  insufficient material for analysis (see [id number]).  Overall, the features are consistent with relapse of the patient's previously diagnosed follicular lymphoma.  Based on the biopsy, this is favored to be a low-grade follicular lymphoma   02/19/2019 -  Chemotherapy   The patient had idelisib for chemotherapy treatment.     05/17/2019 Imaging   1. Interval generalized decrease in abdominopelvic lymphadenopathy identified on the previous study. No new or progressive lymphadenopathy on today's study. 2. Moderate hiatal hernia. 3.  Emphysema (ICD10-J43.9) and Aortic Atherosclerosis (ICD10-170.0)   11/16/2019 Imaging   IMPRESSION: Chest Impression:   1. No mediastinal lymphadenopathy. 2. Post RIGHT lung wedge resection without evidence local recurrence.   Abdomen / Pelvis Impression:  1. Stable numerous small periaortic retroperitoneal nodes and common iliac nodes. No progression of adenopathy. 2. No splenomegaly.  No skeletal metastasis.     07/31/2020 Imaging   1. Stable examination. No significant interval change in the prominent/mildly enlarged lymph nodes below the diaphragm. No thoracic adenopathy. No splenomegaly. 2. Postsurgical change of right lung wedge resection without evidence of local recurrence. 3. Large hiatal hernia. 4. Colonic diverticulosis without findings of acute diverticulitis. 5. Aortic Atherosclerosis (ICD10-I70.0) and Emphysema (ICD10-J43.9).     01/29/2021 Imaging   1. Overall mild interval enlargement of lymph nodes in the abdomen and pelvis as described. 2. No lymphadenopathy identified in the chest. 3. Emphysematous changes of the lungs. 4. Large hiatal hernia.  Extensive colonic diverticulosis. 5. Other ancillary findings as described.     04/30/2021 Imaging   Chest Impression:   1. No mediastinal lymphadenopathy. 2. Large hiatal hernia. 3. Retrocrural node slightly decreased in size.   Abdomen / Pelvis Impression:   1. Cluster of numerous small  retroperitoneal periaortic lymph nodes. Lymph nodes are decreased slightly in size compared to recent CT scan. 2. Likewise reduction in LEFT external iliac small lymph node. 3. No evidence of progression of lymphoma. 4. Normal spleen. 5. No bone involvement.   10/28/2021 Imaging   1. Overall trend towards progressive disease on today's exam. Lymph nodes in the hepatoduodenal ligament are upper normal for size but stable in the interval. Index retroperitoneal lymph nodes are progressive, notably in the right common iliac chain. Soft tissue nodule/lymph node in the retroperitoneal fat adjacent to the right iliacus muscle is progressive. 2. Large hiatal hernia. 3. Colonic diverticulosis without diverticulitis. 4. Prostatomegaly. 5. Aortic Atherosclerosis (ICD10-I70.0) and Emphysema (ICD10-J43.9).   03/10/2022 PET scan   Multiple intensely avid and enlarged retroperitoneal lymph nodes consistent with patient's known history of lymphoma. Of note, some of these lymph nodes demonstrate interval growth when compared to most recent abdominal CT, for reference to the node adjacent to the right psoas musculature. For purposes of assessing transformation into a more aggressive form of lymphoma, SUV max calculated using total body weight is 10.5 for the largest right iliac node and 9.8 for the soft tissue implant posterior to the right psoas muscle.  - Similar focus of increased hypermetabolic to be within the right hilar region, which may represent mediastinal involvement versus reactive etiology. Attention on follow-up examinations.    03/24/2022 Procedure   PROCEDURE: The patient was placed in the prone position. Initial CT images of the pelvis were obtained, demonstrating enlarged pelvic lymph node.   An appropriate entry site was identified and marked on the skin. The right back was prepped and draped using all elements of maximal sterile barrier technique.  Local anesthesia was achieved with lidocaine.    Under intermittent CT guidance, a 18-G coaxial needle was inserted into right pelvic lymph node.  A total of 3 cores were obtained through the cannula using spring-loaded 18-G Corvocet biopsy device.    03/24/2022 Pathology Results   A: Lymph node, pelvis, right, biopsy -  Involved by follicular lymphoma (see Comment)   Special Stains: CD5, [name] and CD20 are performed by immunohistochemistry and/or in situ hybridization in addition to flow cytometry for histopathologic and immunophenotypic correlation.   Appropriate controls for each stain have been evaluated and stain as expected.   Sections of block A1 are stained.   CD3: CD3 stain is negative in neoplastic lymphocytes. CD20: CD20 stains neoplastic lymphocytes. CD21: CD21 stains follicular dendritic cells of enlarge  neoplastic follicles. CD5: CD5 mirrors CD3 [name]: [name] stains weakly neoplastic lymphocytes. Bcl-6: Bcl-6 stains neoplastic lymphocytes. Bcl-2: Bcl-2 stains neoplastic lymphocytes.  Cyclin-D1: Cyclin-D1 stains is negative in neoplastic lymphocytes. Ki-67: Ki-67 shows low proliferation rate in neoplastic lymphocytes (10%). GMS: GMS stain is negative for microorganisms AFB: AFB stain is negative for microorganisms   04/08/2022 -  Chemotherapy   Revlimid was started on 4/4 Patient is on Treatment Plan : LYMPHOMA Lenalidomide + Rituximab q28d        PHYSICAL EXAMINATION: ECOG PERFORMANCE STATUS: 0 - Asymptomatic  Vitals:   06/03/22 0827  BP: 123/65  Pulse: 78  Resp: 18  Temp: 98 F (36.7 C)  SpO2: 98%   Filed Weights   06/03/22 0827  Weight: 191 lb 9.6 oz (86.9 kg)    GENERAL:alert, no distress and comfortable  NEURO: alert & oriented x 3 with fluent speech, no focal motor/sensory deficits  LABORATORY DATA:  I have reviewed the data as listed    Component Value Date/Time   NA 142 06/03/2022 0805   NA 139 01/28/2022 0822   NA 139 04/14/2016 0943   K 4.0 06/03/2022 0805   K 4.6 04/14/2016 0943    CL 105 06/03/2022 0805   CL 104 03/24/2012 1024   CO2 30 06/03/2022 0805   CO2 25 04/14/2016 0943   GLUCOSE 129 (H) 06/03/2022 0805   GLUCOSE 111 04/14/2016 0943   GLUCOSE 80 03/24/2012 1024   BUN 13 06/03/2022 0805   BUN 12 01/28/2022 0822   BUN 8.6 04/14/2016 0943   CREATININE 0.93 06/03/2022 0805   CREATININE 1.1 04/14/2016 0943   CALCIUM 9.0 06/03/2022 0805   CALCIUM 9.4 04/14/2016 0943   PROT 6.1 (L) 06/03/2022 0805   PROT 6.4 10/05/2021 1016   PROT 6.5 04/14/2016 0943   ALBUMIN 4.0 06/03/2022 0805   ALBUMIN 4.4 10/05/2021 1016   ALBUMIN 3.8 04/14/2016 0943   AST 15 06/03/2022 0805   AST 28 04/14/2016 0943   ALT 17 06/03/2022 0805   ALT 41 04/14/2016 0943   ALKPHOS 55 06/03/2022 0805   ALKPHOS 71 04/14/2016 0943   BILITOT 0.8 06/03/2022 0805   BILITOT 0.61 04/14/2016 0943   GFRNONAA >60 06/03/2022 0805   GFRAA 69 09/27/2019 0801    No results found for: "SPEP", "UPEP"  Lab Results  Component Value Date   WBC 7.2 06/03/2022   NEUTROABS 3.6 06/03/2022   HGB 14.6 06/03/2022   HCT 43.4 06/03/2022   MCV 88.0 06/03/2022   PLT 153 06/03/2022      Chemistry      Component Value Date/Time   NA 142 06/03/2022 0805   NA 139 01/28/2022 0822   NA 139 04/14/2016 0943   K 4.0 06/03/2022 0805   K 4.6 04/14/2016 0943   CL 105 06/03/2022 0805   CL 104 03/24/2012 1024   CO2 30 06/03/2022 0805   CO2 25 04/14/2016 0943   BUN 13 06/03/2022 0805   BUN 12 01/28/2022 0822   BUN 8.6 04/14/2016 0943   CREATININE 0.93 06/03/2022 0805   CREATININE 1.1 04/14/2016 0943      Component Value Date/Time   CALCIUM 9.0 06/03/2022 0805   CALCIUM 9.4 04/14/2016 0943   ALKPHOS 55 06/03/2022 0805   ALKPHOS 71 04/14/2016 0943   AST 15 06/03/2022 0805   AST 28 04/14/2016 0943   ALT 17 06/03/2022 0805   ALT 41 04/14/2016 0943   BILITOT 0.8 06/03/2022 0805   BILITOT 0.61 04/14/2016 0943

## 2022-06-03 NOTE — Assessment & Plan Note (Signed)
His rash has resolved He tolerated lower dose of Revlimid better Plan to repeat imaging study in June We will proceed with rituximab as scheduled

## 2022-06-03 NOTE — Patient Instructions (Addendum)
[facility]  Discharge Instructions: Thank you for choosing [facility] to provide your oncology and hematology care.   If you have a lab appointment with the Cancer Center, please go directly to the Cancer Center and check in at the registration area.   Wear comfortable clothing and clothing appropriate for easy access to any Portacath or PICC line.   We strive to give you quality time with your provider. You may need to reschedule your appointment if you arrive late (15 or more minutes).  Arriving late affects you and other patients whose appointments are after yours.  Also, if you miss three or more appointments without notifying the office, you may be dismissed from the clinic at the provider's discretion.      For prescription refill requests, have your pharmacy contact our office and allow 72 hours for refills to be completed.    Today you received the following chemotherapy and/or immunotherapy agents :  Rituximab. (  Ruxience ).   To help prevent nausea and vomiting after your treatment, we encourage you to take your nausea medication as directed.  BELOW ARE SYMPTOMS THAT SHOULD BE REPORTED IMMEDIATELY: *FEVER GREATER THAN 100.4 F (38 C) OR HIGHER *CHILLS OR SWEATING *NAUSEA AND VOMITING THAT IS NOT CONTROLLED WITH YOUR NAUSEA MEDICATION *UNUSUAL SHORTNESS OF BREATH *UNUSUAL BRUISING OR BLEEDING *URINARY PROBLEMS (pain or burning when urinating, or frequent urination) *BOWEL PROBLEMS (unusual diarrhea, constipation, pain near the anus) TENDERNESS IN MOUTH AND THROAT WITH OR WITHOUT PRESENCE OF ULCERS (sore throat, sores in mouth, or a toothache) UNUSUAL RASH, SWELLING OR PAIN  UNUSUAL VAGINAL DISCHARGE OR ITCHING   Items with * indicate a potential emergency and should be followed up as soon as possible or go to the Emergency Department if any problems should occur.  Please show the CHEMOTHERAPY ALERT CARD or IMMUNOTHERAPY  ALERT CARD at check-in to the Emergency Department and triage nurse.  Should you have questions after your visit or need to cancel or reschedule your appointment, please contact [facility]  Dept: [phone]  and follow the prompts.  Office hours are 8:00 a.m. to 4:30 p.m. Monday - Friday. Please note that voicemails left after 4:00 p.m. may not be returned until the following business day.  We are closed weekends and major holidays. You have access to a nurse at all times for urgent questions. Please call the main number to the clinic Dept: [phone] and follow the prompts.   For any non-urgent questions, you may also contact your provider using MyChart. We now offer e-Visits for anyone 20 and older to request care online for non-urgent symptoms. For details visit [email or web address].   Also download the MyChart app! Go to the app store, search "MyChart", open the app, select [place], and log in with your MyChart username and password.

## 2022-06-18 ENCOUNTER — Encounter: Payer: Self-pay | Admitting: Hematology and Oncology

## 2022-06-24 ENCOUNTER — Encounter: Payer: Self-pay | Admitting: Hematology and Oncology

## 2022-06-24 ENCOUNTER — Other Ambulatory Visit: Payer: Self-pay

## 2022-06-24 MED ORDER — LENALIDOMIDE 15 MG PO CAPS: ORAL_CAPSULE | ORAL | 0 refills | Status: DC

## 2022-06-29 ENCOUNTER — Ambulatory Visit (HOSPITAL_COMMUNITY)
Admission: RE | Admit: 2022-06-29 | Discharge: 2022-06-29 | Disposition: A | Discharge status: Home / Self Care | Payer: Medicare Other | Source: Ambulatory Visit | Attending: Hematology and Oncology | Admitting: Hematology and Oncology

## 2022-06-29 DIAGNOSIS — N281 Cyst of kidney, acquired: Secondary | ICD-10-CM | POA: Diagnosis not present

## 2022-06-29 DIAGNOSIS — C969 Malignant neoplasm of lymphoid, hematopoietic and related tissue, unspecified: Secondary | ICD-10-CM | POA: Diagnosis not present

## 2022-06-29 DIAGNOSIS — N400 Enlarged prostate without lower urinary tract symptoms: Secondary | ICD-10-CM | POA: Diagnosis not present

## 2022-06-29 DIAGNOSIS — J439 Emphysema, unspecified: Secondary | ICD-10-CM | POA: Diagnosis not present

## 2022-06-29 DIAGNOSIS — C8280 Other types of follicular lymphoma, unspecified site: Secondary | ICD-10-CM | POA: Insufficient documentation

## 2022-06-29 MED ORDER — SODIUM CHLORIDE (PF) 0.9 % IJ SOLN
INTRAMUSCULAR | Status: AC
  Filled 2022-06-29: qty 50

## 2022-06-29 MED ORDER — IOHEXOL 300 MG/ML  SOLN
100.0000 mL | Freq: Once | INTRAMUSCULAR | Status: AC | PRN
  Administered 2022-06-29: 100 mL via INTRAVENOUS

## 2022-07-01 ENCOUNTER — Inpatient Hospital Stay: Payer: Medicare Other

## 2022-07-01 ENCOUNTER — Encounter: Payer: Self-pay | Admitting: Hematology and Oncology

## 2022-07-01 ENCOUNTER — Other Ambulatory Visit: Payer: Self-pay

## 2022-07-01 ENCOUNTER — Inpatient Hospital Stay: Payer: Medicare Other | Attending: Hematology and Oncology | Admitting: Hematology and Oncology

## 2022-07-01 VITALS — BP 130/78 | HR 59 | Temp 97.5°F | Resp 18 | Ht 72.0 in | Wt 189.8 lb

## 2022-07-01 VITALS — BP 109/61 | HR 58 | Temp 97.7°F | Resp 18

## 2022-07-01 DIAGNOSIS — C8280 Other types of follicular lymphoma, unspecified site: Secondary | ICD-10-CM

## 2022-07-01 DIAGNOSIS — D696 Thrombocytopenia, unspecified: Secondary | ICD-10-CM | POA: Diagnosis not present

## 2022-07-01 DIAGNOSIS — C8288 Other types of follicular lymphoma, lymph nodes of multiple sites: Secondary | ICD-10-CM | POA: Insufficient documentation

## 2022-07-01 DIAGNOSIS — Z5112 Encounter for antineoplastic immunotherapy: Secondary | ICD-10-CM | POA: Diagnosis not present

## 2022-07-01 LAB — CMP (CANCER CENTER ONLY)
ALT: 19 U/L (ref 0–44)
AST: 17 U/L (ref 15–41)
Albumin: 3.9 g/dL (ref 3.5–5.0)
Alkaline Phosphatase: 62 U/L (ref 38–126)
Anion gap: 9 (ref 5–15)
BUN: 16 mg/dL (ref 8–23)
CO2: 27 mmol/L (ref 22–32)
Calcium: 9.3 mg/dL (ref 8.9–10.3)
Chloride: 104 mmol/L (ref 98–111)
Creatinine: 1.1 mg/dL (ref 0.61–1.24)
GFR, Estimated: >60 mL/min (ref >60)
Glucose, Bld: 144 mg/dL — ABNORMAL HIGH (ref 70–99)
Potassium: 4.5 mmol/L (ref 3.5–5.1)
Sodium: 140 mmol/L (ref 135–145)
Total Bilirubin: 0.7 mg/dL (ref 0.3–1.2)
Total Protein: 6 g/dL — ABNORMAL LOW (ref 6.5–8.1)

## 2022-07-01 LAB — CBC WITH DIFFERENTIAL (CANCER CENTER ONLY)
Abs Immature Granulocytes: 0.02 10*3/uL (ref 0.00–0.07)
Basophils Absolute: 0.1 10*3/uL (ref 0.0–0.1)
Basophils Relative: 2 %
Eosinophils Absolute: 0.8 10*3/uL — ABNORMAL HIGH (ref 0.0–0.5)
Eosinophils Relative: 14 %
HCT: 46.1 % (ref 39.0–52.0)
Hemoglobin: 15.4 g/dL (ref 13.0–17.0)
Immature Granulocytes: 0 %
Lymphocytes Relative: 21 %
Lymphs Abs: 1.3 10*3/uL (ref 0.7–4.0)
MCH: 29.6 pg (ref 26.0–34.0)
MCHC: 33.4 g/dL (ref 30.0–36.0)
MCV: 88.7 fL (ref 80.0–100.0)
Monocytes Absolute: 0.9 10*3/uL (ref 0.1–1.0)
Monocytes Relative: 15 %
Neutro Abs: 3 10*3/uL (ref 1.7–7.7)
Neutrophils Relative %: 48 %
Platelet Count: 142 10*3/uL — ABNORMAL LOW (ref 150–400)
RBC: 5.2 MIL/uL (ref 4.22–5.81)
RDW: 13.4 % (ref 11.5–15.5)
Smear Review: NORMAL
WBC Count: 6.1 10*3/uL (ref 4.0–10.5)
nRBC: 0 % (ref 0.0–0.2)

## 2022-07-01 MED ORDER — FAMOTIDINE IN NACL 20-0.9 MG/50ML-% IV SOLN
20.0000 mg | Freq: Once | INTRAVENOUS | Status: AC
  Administered 2022-07-01: 20 mg via INTRAVENOUS
  Filled 2022-07-01: qty 50

## 2022-07-01 MED ORDER — DIPHENHYDRAMINE HCL 25 MG PO CAPS
25.0000 mg | ORAL_CAPSULE | Freq: Once | ORAL | Status: AC
  Administered 2022-07-01: 25 mg via ORAL
  Filled 2022-07-01: qty 1

## 2022-07-01 MED ORDER — SODIUM CHLORIDE 0.9 % IV SOLN: Freq: Once | INTRAVENOUS | Status: AC

## 2022-07-01 MED ORDER — MONTELUKAST SODIUM 10 MG PO TABS
10.0000 mg | ORAL_TABLET | Freq: Once | ORAL | Status: AC
  Administered 2022-07-01: 10 mg via ORAL
  Filled 2022-07-01: qty 1

## 2022-07-01 MED ORDER — SODIUM CHLORIDE 0.9 % IV SOLN
375.0000 mg/m2 | Freq: Once | INTRAVENOUS | Status: AC
  Administered 2022-07-01: 800 mg via INTRAVENOUS
  Filled 2022-07-01: qty 50

## 2022-07-01 MED ORDER — ACETAMINOPHEN 325 MG PO TABS
650.0000 mg | ORAL_TABLET | Freq: Once | ORAL | Status: AC
  Administered 2022-07-01: 650 mg via ORAL
  Filled 2022-07-01: qty 2

## 2022-07-01 NOTE — Patient Instructions (Signed)
[facility]  Discharge Instructions: Thank you for choosing [facility] to provide your oncology and hematology care.   If you have a lab appointment with the Cancer Center, please go directly to the Cancer Center and check in at the registration area.   Wear comfortable clothing and clothing appropriate for easy access to any Portacath or PICC line.   We strive to give you quality time with your provider. You may need to reschedule your appointment if you arrive late (15 or more minutes).  Arriving late affects you and other patients whose appointments are after yours.  Also, if you miss three or more appointments without notifying the office, you may be dismissed from the clinic at the provider's discretion.      For prescription refill requests, have your pharmacy contact our office and allow 72 hours for refills to be completed.    Today you received the following chemotherapy and/or immunotherapy agents rituxan      To help prevent nausea and vomiting after your treatment, we encourage you to take your nausea medication as directed.  BELOW ARE SYMPTOMS THAT SHOULD BE REPORTED IMMEDIATELY: *FEVER GREATER THAN 100.4 F (38 C) OR HIGHER *CHILLS OR SWEATING *NAUSEA AND VOMITING THAT IS NOT CONTROLLED WITH YOUR NAUSEA MEDICATION *UNUSUAL SHORTNESS OF BREATH *UNUSUAL BRUISING OR BLEEDING *URINARY PROBLEMS (pain or burning when urinating, or frequent urination) *BOWEL PROBLEMS (unusual diarrhea, constipation, pain near the anus) TENDERNESS IN MOUTH AND THROAT WITH OR WITHOUT PRESENCE OF ULCERS (sore throat, sores in mouth, or a toothache) UNUSUAL RASH, SWELLING OR PAIN  UNUSUAL VAGINAL DISCHARGE OR ITCHING   Items with * indicate a potential emergency and should be followed up as soon as possible or go to the Emergency Department if any problems should occur.  Please show the CHEMOTHERAPY ALERT CARD or IMMUNOTHERAPY ALERT CARD at check-in  to the Emergency Department and triage nurse.  Should you have questions after your visit or need to cancel or reschedule your appointment, please contact [facility]  Dept: [phone]  and follow the prompts.  Office hours are 8:00 a.m. to 4:30 p.m. Monday - Friday. Please note that voicemails left after 4:00 p.m. may not be returned until the following business day.  We are closed weekends and major holidays. You have access to a nurse at all times for urgent questions. Please call the main number to the clinic Dept: [phone] and follow the prompts.   For any non-urgent questions, you may also contact your provider using MyChart. We now offer e-Visits for anyone 18 and older to request care online for non-urgent symptoms. For details visit [email or web address].   Also download the MyChart app! Go to the app store, search "MyChart", open the app, select [place], and log in with your MyChart username and password.  

## 2022-07-01 NOTE — Progress Notes (Signed)
ANC is 3.0 per lab.

## 2022-07-01 NOTE — Progress Notes (Signed)
[facility] OFFICE PROGRESS NOTE  Patient Care Team: Dettinger, Elige Radon, MD as PCP - General (Family Medicine) Branch, Dorothe Pea, MD as PCP - Cardiology (Cardiology) Mealor, Roberts Gaudy, MD as PCP - Electrophysiology (Cardiology) Iva Boop, MD as Consulting Physician (Gastroenterology) Artis Delay, MD as Consulting Physician (Hematology and Oncology) Cherlyn Roberts, MD as Consulting Physician (Dermatology)  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HCC) Overall, he tolerated treatment fairly well without major side effects CT imaging showed excellent response to therapy I recommend we continue treatment for few more cycles with plan to repeat imaging study again in 3 months I will reach out to his oncologist at [facility] to give an update  Thrombocytopenia [facility]) This is likely due to recent treatment. The patient denies recent history of bleeding such as epistaxis, hematuria or hematochezia. He is asymptomatic from the low platelet count. I will observe for now.  he does not require transfusion now. I will continue the chemotherapy at current dose without dosage adjustment.   Orders Placed This Encounter  Procedures   CBC with Differential (Cancer Center Only)    Standing Status:   Future    Standing Expiration Date:   08/26/2023   CMP (Cancer Center only)    Standing Status:   Future    Standing Expiration Date:   08/26/2023    All questions were answered. The patient knows to call the clinic with any problems, questions or concerns. The total time spent in the appointment was 30 minutes encounter with patients including review of chart and various tests results, discussions about plan of care and coordination of care plan   Artis Delay, MD 07/01/2022 1:54 PM  INTERVAL HISTORY: Please see below for problem oriented charting. he returns for treatment follow-up with his wife He tolerated treatment well We spent majority of our time reviewing test  results  REVIEW OF SYSTEMS:   Constitutional: Denies fevers, chills or abnormal weight loss Eyes: Denies blurriness of vision Ears, nose, mouth, throat, and face: Denies mucositis or sore throat Respiratory: Denies cough, dyspnea or wheezes Cardiovascular: Denies palpitation, chest discomfort or lower extremity swelling Gastrointestinal:  Denies nausea, heartburn or change in bowel habits Skin: Denies abnormal skin rashes Lymphatics: Denies new lymphadenopathy or easy bruising Neurological:Denies numbness, tingling or new weaknesses Behavioral/Psych: Mood is stable, no new changes  All other systems were reviewed with the patient and are negative.  I have reviewed the past medical history, past surgical history, social history and family history with the patient and they are unchanged from previous note.  ALLERGIES:  is allergic to rituximab.  MEDICATIONS:  Current Outpatient Medications  Medication Sig Dispense Refill   acyclovir (ZOVIRAX) 400 MG tablet Take 1 tablet (400 mg total) by mouth daily. 90 tablet 1   apixaban (ELIQUIS) 5 MG TABS tablet TAKE 1 TABLET TWICE A DAY 180 tablet 1   Ascorbic Acid (VITAMIN C) 1000 MG tablet Take 1,000 mg by mouth daily.     brimonidine (ALPHAGAN) 0.2 % ophthalmic solution Place 1 drop into both eyes 2 (two) times daily.      Calcium Carbonate-Vit D-Min (CALCIUM 1200 PO) Take 1,200 mg by mouth daily.     cyclobenzaprine (FLEXERIL) 5 MG tablet Take 1 tablet (5 mg total) by mouth 3 (three) times daily as needed for muscle spasms. 30 tablet 0   finasteride (PROSCAR) 5 MG tablet Take 1 tablet (5 mg total) by mouth daily. 90 tablet 3   lenalidomide (REVLIMID)  15 MG capsule TAKE 1 CAPSULE DAILY FOR 21 DAYS ON, THEN 7 DAYS OFF, REPEAT EVERY 28 DAYS 21 capsule 0   Lifitegrast (XIIDRA) 5 % SOLN Apply 1 drop to eye 2 (two) times daily.     LUMIGAN 0.01 % SOLN Place 1 drop into both eyes at bedtime.     meclizine (ANTIVERT) 25 MG tablet Take 1 tablet (25 mg  total) by mouth 3 (three) times daily as needed for dizziness. 30 tablet 0   metoprolol succinate (TOPROL-XL) 25 MG 24 hr tablet Take 0.5 tablets (12.5 mg total) by mouth 2 (two) times daily. 90 tablet 3   mometasone (ELOCON) 0.1 % cream Apply 1 Application topically 2 (two) times daily as needed (eczema).     Multiple Vitamin (MULTIVITAMIN WITH MINERALS) TABS tablet Take 1 tablet by mouth daily.      omeprazole (PRILOSEC OTC) 20 MG tablet Take 20 mg by mouth every other day.     ondansetron (ZOFRAN) 8 MG tablet Take 1 tablet (8 mg total) by mouth every 8 (eight) hours as needed for nausea or vomiting. 30 tablet 1   Polyethyl Glycol-Propyl Glycol (SYSTANE OP) Place 1 drop into both eyes daily as needed (dry eyes).     Probiotic Product (SUPERIOR PROBIOTIC) CAPS Take 1 capsule by mouth daily.     prochlorperazine (COMPAZINE) 10 MG tablet Take 1 tablet (10 mg total) by mouth every 6 (six) hours as needed for nausea or vomiting. 30 tablet 1   rosuvastatin (CRESTOR) 20 MG tablet Take 1 tablet (20 mg total) by mouth daily. 90 tablet 3   silodosin (RAPAFLO) 8 MG CAPS capsule Take 1 capsule (8 mg total) by mouth daily with breakfast. 90 capsule 3   No current facility-administered medications for this visit.    SUMMARY OF ONCOLOGIC HISTORY: Oncology History Overview Note  Low grade B-cell lymphoma   Primary site: Lymphoid Neoplasms   Staging method: AJCC 6th Edition   Clinical: Stage IV signed by Artis Delay, MD on 09/26/2013  9:15 AM   Summary: Stage IV      Follicular low grade B-cell lymphoma (HCC)  12/10/2003 Surgery   Inguinal lymph node biopsy showed follicular lymphoma.   12/12/2003 - 06/01/2004 Chemotherapy   He was treated with R. CHOP chemotherapy which show complete remission. The number of cycles of R. CHOP chemotherapy was unknown.   12/19/2006 Surgery   Lung resection show follicular lymphoma.   01/02/2007 - 09/01/2008 Chemotherapy   The patient was treated with single agent  rituximab alone.   01/19/2007 Bone Marrow Biopsy   Bone marrow biopsy was negative.   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   05/08/2013 - 05/11/2013 Hospital Admission   The patient was admitted to the hospital for management of pericarditis. CT scan showed extensive lymphadenopathy.   06/07/2013 Imaging   PET/CT scan showed extensive lymphadenopathy   06/25/2013 Procedure   He has placement of Infuse-a-Port.   06/28/2013 Bone Marrow Biopsy   Bone marrow biopsy is positive for lymphoma involvement.   07/04/2013 - 11/22/2013 Chemotherapy   He is treated with 6 cycles of bendamustine with rituximab.   09/24/2013 Imaging   Repeat PET scan show complete remission.   12/26/2013 Imaging   PEt scan showed complete remission   09/26/2014 Imaging   CT scan of the chest abdomen and pelvis show no evidence of disease   03/26/2015 Imaging   CT scan showed no evidence of lymphoma   04/14/2016  Imaging   CT: Borderline prominent right hilar and subcarinal lymph nodes, but not appreciably changed. 2. Low-grade but increased central mesenteric stranding with some small mesenteric lymph nodes. This could certainly be inflammatory, 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, much of which is likely a physiologic response to cholecystectomy. 7. Sigmoid colon diverticulosis. 8.  Prominent stool throughout the colon favors constipation. 9. Enlarged prostate gland, volume estimated at 75 cubic cm.   02/15/2017 Imaging   No evidence of recurrent lymphoma or other acute findings.  Stable moderate hiatal hernia.  Colonic diverticulosis, without radiographic  evidence of diverticulitis.  Stable mildly enlarged prostate.  Mild emphysema.  Aortic and coronary artery atherosclerosis.   01/31/2019 Imaging   1. Interval development of abdominal and pelvic adenopathy compatible with recurrent lymphoma. 2. Emphysema and aortic atherosclerosis. 3. Multi vessel coronary artery calcifications. 4. Moderate to large hiatal hernia   02/09/2019 Procedure   Successful CT-guided core biopsy of the left retroperitoneal periaortic adenopathy   02/09/2019 Pathology Results   SURGICAL PATHOLOGY  CASE: [id number]  PATIENT: [name]  Surgical Pathology Report   Clinical History: Lymphoma; [name], LEFT PARA AORTIC, [name], cleaved nuclei and scant cytoplasm.  By immunohistochemistry, [name], [name], [name], [name] is low (less than 10%).  Flow cytometry was attempted; however, there was insufficient material for analysis (see [id number]).  Overall, the features are consistent with relapse of the patient's previously diagnosed follicular lymphoma.  Based on the biopsy, this is favored to be a low-grade follicular lymphoma   02/19/2019 -  Chemotherapy   The patient had idelisib for chemotherapy treatment.     05/17/2019 Imaging   1. Interval generalized decrease in abdominopelvic lymphadenopathy identified on the previous study. No new or progressive lymphadenopathy on today's study. 2. Moderate hiatal hernia. 3.   Emphysema (ICD10-J43.9) and Aortic Atherosclerosis (ICD10-170.0)   11/16/2019 Imaging   IMPRESSION: Chest Impression:   1. No mediastinal lymphadenopathy. 2. Post RIGHT lung wedge resection without evidence local recurrence.   Abdomen / Pelvis Impression:   1. Stable numerous small periaortic retroperitoneal nodes and common iliac nodes. No progression of adenopathy. 2. No splenomegaly.  No skeletal metastasis.     07/31/2020 Imaging   1. Stable examination. No significant interval change in the prominent/mildly enlarged lymph nodes below the diaphragm. No thoracic adenopathy. No splenomegaly. 2. Postsurgical change of right lung wedge resection without evidence of local recurrence. 3. Large hiatal hernia. 4. Colonic diverticulosis without findings of acute diverticulitis. 5. Aortic Atherosclerosis (ICD10-I70.0) and Emphysema (ICD10-J43.9).     01/29/2021 Imaging   1. Overall mild interval enlargement of lymph nodes in the abdomen and pelvis as described. 2. No lymphadenopathy identified in the chest. 3. Emphysematous changes  of the lungs. 4. Large hiatal hernia.  Extensive colonic diverticulosis. 5. Other ancillary findings as described.     04/30/2021 Imaging   Chest Impression:   1. No mediastinal lymphadenopathy. 2. Large hiatal hernia. 3. Retrocrural node slightly decreased in size.   Abdomen / Pelvis Impression:   1. Cluster of numerous small retroperitoneal periaortic lymph nodes. Lymph nodes are decreased slightly in size compared to recent CT scan. 2. Likewise reduction in LEFT external iliac small lymph node. 3. No evidence of progression of lymphoma. 4. Normal spleen. 5. No bone involvement.   10/28/2021 Imaging   1. Overall trend towards progressive disease on today's exam. Lymph nodes in the hepatoduodenal ligament are upper normal for size but stable in the interval. Index retroperitoneal lymph nodes are progressive, notably in the right common iliac chain.  Soft tissue nodule/lymph node in the retroperitoneal fat adjacent to the right iliacus muscle is progressive. 2. Large hiatal hernia. 3. Colonic diverticulosis without diverticulitis. 4. Prostatomegaly. 5. Aortic Atherosclerosis (ICD10-I70.0) and Emphysema (ICD10-J43.9).   03/10/2022 PET scan   Multiple intensely avid and enlarged retroperitoneal lymph nodes consistent with patient's known history of lymphoma. Of note, some of these lymph nodes demonstrate interval growth when compared to most recent abdominal CT, for reference to the node adjacent to the right psoas musculature. For purposes of assessing transformation into a more aggressive form of lymphoma, SUV max calculated using total body weight is 10.5 for the largest right iliac node and 9.8 for the soft tissue implant posterior to the right psoas muscle.  - Similar focus of increased hypermetabolic to be within the right hilar region, which may represent mediastinal involvement versus reactive etiology. Attention on follow-up examinations.    03/24/2022 Procedure   PROCEDURE: The patient was placed in the prone position. Initial CT images of the pelvis were obtained, demonstrating enlarged pelvic lymph node.   An appropriate entry site was identified and marked on the skin. The right back was prepped and draped using all elements of maximal sterile barrier technique.  Local anesthesia was achieved with lidocaine.   Under intermittent CT guidance, a 18-G coaxial needle was inserted into right pelvic lymph node.  A total of 3 cores were obtained through the cannula using spring-loaded 18-G Corvocet biopsy device.    03/24/2022 Pathology Results   A: Lymph node, pelvis, right, biopsy -  Involved by follicular lymphoma (see Comment)   Special Stains: CD5, [name] and CD20 are performed by immunohistochemistry and/or in situ hybridization in addition to flow cytometry for histopathologic and immunophenotypic correlation.   Appropriate controls  for each stain have been evaluated and stain as expected.   Sections of block A1 are stained.   CD3: CD3 stain is negative in neoplastic lymphocytes. CD20: CD20 stains neoplastic lymphocytes. CD21: CD21 stains follicular dendritic cells of enlarge neoplastic follicles. CD5: CD5 mirrors CD3 [name]: [name] stains weakly neoplastic lymphocytes. Bcl-6: Bcl-6 stains neoplastic lymphocytes. Bcl-2: Bcl-2 stains neoplastic lymphocytes.  Cyclin-D1: Cyclin-D1 stains is negative in neoplastic lymphocytes. Ki-67: Ki-67 shows low proliferation rate in neoplastic lymphocytes (10%). GMS: GMS stain is negative for microorganisms AFB: AFB stain is negative for microorganisms   04/08/2022 -  Chemotherapy   Revlimid was started on 4/4 Patient is on Treatment Plan : LYMPHOMA Lenalidomide + Rituximab q28d      07/01/2022 Imaging   CT CHEST ABDOMEN PELVIS W CONTRAST  Result Date: 07/01/2022 CLINICAL DATA:  Hematologic malignancy, assess treatment response follicular B-cell lymphoma, ongoing chemotherapy * Tracking Code: BO *  EXAM: CT CHEST, ABDOMEN, AND PELVIS WITH CONTRAST TECHNIQUE: Multidetector CT imaging of the chest, abdomen and pelvis was performed following the standard protocol during bolus administration of intravenous contrast. RADIATION DOSE REDUCTION: This exam was performed according to the departmental dose-optimization program which includes automated exposure control, adjustment of the mA and/or kV according to patient size and/or use of iterative reconstruction technique. CONTRAST:  OMNIPAQUE IOHEXOL 300 MG/ML  SOLN COMPARISON:  PET-CT, 03/10/2022 FINDINGS: CT CHEST FINDINGS Cardiovascular: Aortic atherosclerosis. Normal heart size. Three-vessel coronary artery calcifications. No pericardial effusion. Mediastinum/Nodes: No enlarged mediastinal, hilar, or axillary lymph nodes. Moderate hiatal hernia with intrathoracic position of the gastric fundus. Unchanged prominent lymph nodes at the right  aspect of the hiatal hernia, not previously FDG avid. Thyroid gland, trachea, and esophagus demonstrate no significant findings. Lungs/Pleura: Moderate centrilobular and paraseptal emphysema. Unchanged postoperative findings of right lower lobe wedge resection. No pleural effusion or pneumothorax. Musculoskeletal: No chest wall abnormality. No acute osseous findings. CT ABDOMEN PELVIS FINDINGS Hepatobiliary: No focal liver abnormality is seen. Status post cholecystectomy. Unchanged postoperative biliary ductal dilatation. Pancreas: Unremarkable. No pancreatic ductal dilatation or surrounding inflammatory changes. Spleen: Normal in size without significant abnormality. Adrenals/Urinary Tract: Adrenal glands are unremarkable. Simple, benign left renal cortical cysts, for which no further follow-up or characterization is required. Kidneys are otherwise normal, without renal calculi, solid lesion, or hydronephrosis. Bladder is unremarkable. Stomach/Bowel: Stomach is within normal limits. Appendix not clearly visualized. No evidence of bowel wall thickening, distention, or inflammatory changes. Descending and sigmoid diverticulosis Vascular/Lymphatic: Aortic atherosclerosis. Interval decrease in size of previously FDG avid retroperitoneal lymph nodes, largest right common iliac node measuring 0.9 x 0.8 cm (series 2, image 73). Unchanged prominent retroperitoneal lymph nodes, not previously FDG avid (series 2, image 63). Reproductive: Prostatomegaly with median lobe hypertrophy. Other: Small fat containing right inguinal hernia. No ascites. Nearly complete resolution of a retroperitoneal soft tissue nodule overlying the right iliac crest measuring 0.7 x 0.5 cm (series 2, image 87). Musculoskeletal: No acute osseous findings. IMPRESSION: 1. Interval decrease in size of previously FDG avid retroperitoneal lymph nodes, as well as a soft tissue nodule overlying the right iliac crest. 2. Additional prominent lymph nodes  adjacent to the hiatal hernia and in the retroperitoneum are unchanged, not previously FDG avid. 3. Findings are consistent with treatment response of lymphoma. 4. Emphysema. 5. Coronary artery disease. 6. Prostatomegaly. Aortic Atherosclerosis (ICD10-I70.0) and Emphysema (ICD10-J43.9). Electronically Signed   By: Jearld Lesch M.D.   On: 07/01/2022 08:39        PHYSICAL EXAMINATION: ECOG PERFORMANCE STATUS: 0 - Asymptomatic  Vitals:   07/01/22 0823  BP: 130/78  Pulse: (!) 59  Resp: 18  Temp: (!) 97.5 F (36.4 C)  SpO2: 98%   Filed Weights   07/01/22 0823  Weight: 189 lb 12.8 oz (86.1 kg)    GENERAL:alert, no distress and comfortable NEURO: alert & oriented x 3 with fluent speech, no focal motor/sensory deficits  LABORATORY DATA:  I have reviewed the data as listed    Component Value Date/Time   NA 140 07/01/2022 0800   NA 139 01/28/2022 0822   NA 139 04/14/2016 0943   K 4.5 07/01/2022 0800   K 4.6 04/14/2016 0943   CL 104 07/01/2022 0800   CL 104 03/24/2012 1024   CO2 27 07/01/2022 0800   CO2 25 04/14/2016 0943   GLUCOSE 144 (H) 07/01/2022 0800   GLUCOSE 111 04/14/2016 0943   GLUCOSE 80 03/24/2012 1024  BUN 16 07/01/2022 0800   BUN 12 01/28/2022 0822   BUN 8.6 04/14/2016 0943   CREATININE 1.10 07/01/2022 0800   CREATININE 1.1 04/14/2016 0943   CALCIUM 9.3 07/01/2022 0800   CALCIUM 9.4 04/14/2016 0943   PROT 6.0 (L) 07/01/2022 0800   PROT 6.4 10/05/2021 1016   PROT 6.5 04/14/2016 0943   ALBUMIN 3.9 07/01/2022 0800   ALBUMIN 4.4 10/05/2021 1016   ALBUMIN 3.8 04/14/2016 0943   AST 17 07/01/2022 0800   AST 28 04/14/2016 0943   ALT 19 07/01/2022 0800   ALT 41 04/14/2016 0943   ALKPHOS 62 07/01/2022 0800   ALKPHOS 71 04/14/2016 0943   BILITOT 0.7 07/01/2022 0800   BILITOT 0.61 04/14/2016 0943   GFRNONAA >60 07/01/2022 0800   GFRAA 69 09/27/2019 0801    No results found for: "SPEP", "UPEP"  Lab Results  Component Value Date   WBC 6.1 07/01/2022    NEUTROABS 3.0 07/01/2022   HGB 15.4 07/01/2022   HCT 46.1 07/01/2022   MCV 88.7 07/01/2022   PLT 142 (L) 07/01/2022      Chemistry      Component Value Date/Time   NA 140 07/01/2022 0800   NA 139 01/28/2022 0822   NA 139 04/14/2016 0943   K 4.5 07/01/2022 0800   K 4.6 04/14/2016 0943   CL 104 07/01/2022 0800   CL 104 03/24/2012 1024   CO2 27 07/01/2022 0800   CO2 25 04/14/2016 0943   BUN 16 07/01/2022 0800   BUN 12 01/28/2022 0822   BUN 8.6 04/14/2016 0943   CREATININE 1.10 07/01/2022 0800   CREATININE 1.1 04/14/2016 0943      Component Value Date/Time   CALCIUM 9.3 07/01/2022 0800   CALCIUM 9.4 04/14/2016 0943   ALKPHOS 62 07/01/2022 0800   ALKPHOS 71 04/14/2016 0943   AST 17 07/01/2022 0800   AST 28 04/14/2016 0943   ALT 19 07/01/2022 0800   ALT 41 04/14/2016 0943   BILITOT 0.7 07/01/2022 0800   BILITOT 0.61 04/14/2016 0943       RADIOGRAPHIC STUDIES: I have reviewed multiple imaging studies with the patient I have personally reviewed the radiological images as listed and agreed with the findings in the report. CT CHEST ABDOMEN PELVIS W CONTRAST  Result Date: 07/01/2022 CLINICAL DATA:  Hematologic malignancy, assess treatment response follicular B-cell lymphoma, ongoing chemotherapy * Tracking Code: BO * EXAM: CT CHEST, ABDOMEN, AND PELVIS WITH CONTRAST TECHNIQUE: Multidetector CT imaging of the chest, abdomen and pelvis was performed following the standard protocol during bolus administration of intravenous contrast. RADIATION DOSE REDUCTION: This exam was performed according to the departmental dose-optimization program which includes automated exposure control, adjustment of the mA and/or kV according to patient size and/or use of iterative reconstruction technique. CONTRAST:  OMNIPAQUE IOHEXOL 300 MG/ML  SOLN COMPARISON:  PET-CT, 03/10/2022 FINDINGS: CT CHEST FINDINGS Cardiovascular: Aortic atherosclerosis. Normal heart size. Three-vessel coronary artery  calcifications. No pericardial effusion. Mediastinum/Nodes: No enlarged mediastinal, hilar, or axillary lymph nodes. Moderate hiatal hernia with intrathoracic position of the gastric fundus. Unchanged prominent lymph nodes at the right aspect of the hiatal hernia, not previously FDG avid. Thyroid gland, trachea, and esophagus demonstrate no significant findings. Lungs/Pleura: Moderate centrilobular and paraseptal emphysema. Unchanged postoperative findings of right lower lobe wedge resection. No pleural effusion or pneumothorax. Musculoskeletal: No chest wall abnormality. No acute osseous findings. CT ABDOMEN PELVIS FINDINGS Hepatobiliary: No focal liver abnormality is seen. Status post cholecystectomy. Unchanged postoperative biliary ductal dilatation.  Pancreas: Unremarkable. No pancreatic ductal dilatation or surrounding inflammatory changes. Spleen: Normal in size without significant abnormality. Adrenals/Urinary Tract: Adrenal glands are unremarkable. Simple, benign left renal cortical cysts, for which no further follow-up or characterization is required. Kidneys are otherwise normal, without renal calculi, solid lesion, or hydronephrosis. Bladder is unremarkable. Stomach/Bowel: Stomach is within normal limits. Appendix not clearly visualized. No evidence of bowel wall thickening, distention, or inflammatory changes. Descending and sigmoid diverticulosis Vascular/Lymphatic: Aortic atherosclerosis. Interval decrease in size of previously FDG avid retroperitoneal lymph nodes, largest right common iliac node measuring 0.9 x 0.8 cm (series 2, image 73). Unchanged prominent retroperitoneal lymph nodes, not previously FDG avid (series 2, image 63). Reproductive: Prostatomegaly with median lobe hypertrophy. Other: Small fat containing right inguinal hernia. No ascites. Nearly complete resolution of a retroperitoneal soft tissue nodule overlying the right iliac crest measuring 0.7 x 0.5 cm (series 2, image 87).  Musculoskeletal: No acute osseous findings. IMPRESSION: 1. Interval decrease in size of previously FDG avid retroperitoneal lymph nodes, as well as a soft tissue nodule overlying the right iliac crest. 2. Additional prominent lymph nodes adjacent to the hiatal hernia and in the retroperitoneum are unchanged, not previously FDG avid. 3. Findings are consistent with treatment response of lymphoma. 4. Emphysema. 5. Coronary artery disease. 6. Prostatomegaly. Aortic Atherosclerosis (ICD10-I70.0) and Emphysema (ICD10-J43.9). Electronically Signed   By: Jearld Lesch M.D.   On: 07/01/2022 08:39

## 2022-07-01 NOTE — Assessment & Plan Note (Signed)
Overall, he tolerated treatment fairly well without major side effects CT imaging showed excellent response to therapy I recommend we continue treatment for few more cycles with plan to repeat imaging study again in 3 months I will reach out to his oncologist at [facility] to give an update

## 2022-07-01 NOTE — Assessment & Plan Note (Signed)
This is likely due to recent treatment. The patient denies recent history of bleeding such as epistaxis, hematuria or hematochezia. He is asymptomatic from the low platelet count. I will observe for now.  he does not require transfusion now. I will continue the chemotherapy at current dose without dosage adjustment.    

## 2022-07-02 ENCOUNTER — Encounter: Payer: Self-pay | Admitting: Hematology and Oncology

## 2022-07-02 ENCOUNTER — Encounter: Payer: Self-pay | Admitting: Nurse Practitioner

## 2022-07-02 ENCOUNTER — Ambulatory Visit: Payer: Medicare Other | Attending: Cardiology | Admitting: Nurse Practitioner

## 2022-07-02 VITALS — BP 100/58 | HR 88 | Ht 72.0 in | Wt 190.0 lb

## 2022-07-02 DIAGNOSIS — E785 Hyperlipidemia, unspecified: Secondary | ICD-10-CM | POA: Insufficient documentation

## 2022-07-02 DIAGNOSIS — I10 Essential (primary) hypertension: Secondary | ICD-10-CM | POA: Diagnosis not present

## 2022-07-02 DIAGNOSIS — I480 Paroxysmal atrial fibrillation: Secondary | ICD-10-CM | POA: Insufficient documentation

## 2022-07-02 DIAGNOSIS — R29818 Other symptoms and signs involving the nervous system: Secondary | ICD-10-CM | POA: Insufficient documentation

## 2022-07-02 DIAGNOSIS — R531 Weakness: Secondary | ICD-10-CM | POA: Insufficient documentation

## 2022-07-02 DIAGNOSIS — R5383 Other fatigue: Secondary | ICD-10-CM | POA: Diagnosis not present

## 2022-07-02 DIAGNOSIS — I2510 Atherosclerotic heart disease of native coronary artery without angina pectoris: Secondary | ICD-10-CM | POA: Diagnosis not present

## 2022-07-02 MED ORDER — METOPROLOL SUCCINATE ER 25 MG PO TB24: 12.5000 mg | ORAL_TABLET | Freq: Every day | ORAL | 3 refills | Status: DC

## 2022-07-02 NOTE — Progress Notes (Signed)
Office Visit    Patient Name: Anthony Kelly Date of Encounter: 07/02/2022  PCP:  Dettinger, Elige Radon, MD   [facility]  Cardiologist:  Dina Rich, MD  Advanced Practice Provider:  No care team member to display Electrophysiologist:  Maurice Small, MD   Chief Complaint    Anthony Kelly is a 77 y.o. male with a hx of PAF, CAD, NSVT, lymphoma, hypertension, and hyperlipidemia, who presents today for 80-month follow-up.  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 04/2013 -adm with CP, elevated CRP. H/o coronary artery calcification on prior CT but nuc was negative, EF 71%.   Arthritis    Atrial fibrillation (HCC)    a. Isolated episode in the setting of acute pericarditis 04/2013. Was not placed on anticoag.   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   Metastasis to lung (HCC) dx'd 01/2007   Metastasis to lymph nodes (HCC) dx'd 03/2011   lt submandibular ln   Pain in joint, pelvic region and thigh 08/01/2013   PSVT (paroxysmal supraventricular tachycardia)    SCC (squamous cell carcinoma)    Left cheek   Past Surgical History:  Procedure Laterality Date   ATRIAL FIBRILLATION ABLATION N/A 02/17/2022   Procedure: ATRIAL FIBRILLATION ABLATION;  Surgeon: Maurice Small, MD;  Location: [facility] INVASIVE CV LAB;  Service: Cardiovascular;  Laterality: N/A;   CHOLECYSTECTOMY      EXPLORATORY LAPAROTOMY     EYE SURGERY Right 2016   cataract   LEFT HEART CATH AND CORONARY ANGIOGRAPHY N/A 06/05/2019   Procedure: LEFT HEART CATH AND CORONARY ANGIOGRAPHY;  Surgeon: Corky Crafts, MD;  Location: [facility] INVASIVE CV LAB;  Service: Cardiovascular;  Laterality: N/A;   LUNG LOBECTOMY     right side   LYMPH NODE BIOPSY     in groin with removal   MENISCUS REPAIR     right knee   MOHS SURGERY Left 09/2020   cheek   PROSTATE SURGERY     REFRACTIVE SURGERY Right    piece of metal removed   SUBMANDIBULAR GLAND EXCISION  04/2011   SUBMANDIBULAR GLAND EXCISION  04/30/2011   Procedure: EXCISION SUBMANDIBULAR GLAND;  Surgeon: Osborn Coho, MD;  Location[facility];  Service: ENT;  Laterality: Left;  WITH DIAGNOSTIC BIOPSY   TONSILLECTOMY     as a child   VEIN LIGATION AND STRIPPING     right leg    Allergies  Allergies  Allergen Reactions   Rituximab Other (See Comments)    2015 pt had reaction    History of Present Illness    Anthony Kelly is a 77 y.o. male with a PMH as mentioned above.  Followed by EP for history of PAF.  Underwent A-fib ablation on February 12, 2022  Last seen by Dr. Dina Rich on March 03, 2022.  Remained in sinus rhythm.  Saw Dr. Nelly Laurence on May 7,2024. Was noted he did have a recurrence of A-fib once or twice after ablation.   Today he presents for 56-month follow-up with his wife.  He states he is overall doing well, does have some weakness/fatigue, has returned to A-fib. Denies any chest pain, shortness of breath, palpitations, syncope, presyncope, dizziness, orthopnea, PND, swelling or significant weight changes, acute bleeding, or claudication.  STOP-BANG score 5.  EKGs/Labs/Other Studies Reviewed:   The following studies were reviewed today:   EKG:  EKG is not ordered today.   Cardiac monitor 11/2021 7 day monitor   Occasional supraventricular ectopy in the form of isolated PACs, couplets. Rare triplets. 54 runs of SVT  longest 19.2 seconds.   Rare ventricular ectopy in the form of isolated PVCs   Reported symptoms correlated with PACs and short runs of SVT.     Patch Wear Time:  6 days and 22 hours (2023-10-10T10:01:32-0400 to 2023-10-17T08:15:09-398)   Patient had a min HR of 45 bpm, max HR of 156 bpm, and avg HR of 63 bpm. Predominant underlying rhythm was Sinus Rhythm. 54 Supraventricular Tachycardia runs occurred, the run with the fastest interval lasting 4 beats with a max rate of 156 bpm, the  longest lasting 19.2 secs with an avg rate of 119 bpm. Ectopic Atrial Rhythm was present. Isolated SVEs were occasional (4.3%, [id number]), SVE Couplets were occasional (2.1%, 6598), and SVE Triplets were rare (<1.0%, 328). Isolated VEs were rare (<1.0%), and  no VE Couplets or VE Triplets were present.   Echo 08/2020: 1. Left ventricular ejection fraction, by estimation, is 60 to 65%. The  left ventricle has normal function. The left ventricle has no regional  wall motion abnormalities. There is mild left ventricular hypertrophy.  Left ventricular diastolic parameters  were normal.   2. Right ventricular systolic function is normal. The right ventricular  size is normal. Tricuspid regurgitation signal is inadequate for assessing  PA pressure.   3. The mitral valve is normal in structure. No evidence of mitral valve  regurgitation. No evidence of mitral stenosis.   4. The aortic valve is tricuspid. Aortic valve regurgitation is not  visualized. No aortic stenosis is present.   5. The inferior vena cava is normal in size with greater than 50%  respiratory variability, suggesting right atrial pressure of 3 mmHg.  LHC 05/2019:  Prox RCA lesion is 25% stenosed. Ramus lesion is 50% stenosed. Prox LAD to Mid LAD lesion is 25% stenosed. Severe right subclavian tortuosity made torquing catheters difficult. If cath was needed in the future, would consider alternate access site. The left ventricular systolic function is  normal. LV end diastolic pressure is normal. The left ventricular ejection fraction is 55-65% by visual estimate. There is no aortic valve stenosis.   Nonobstructive CAD.  Continue preventive therapy.   Recent Labs: 07/01/2022: ALT 19; BUN 16; Creatinine 1.10; Hemoglobin 15.4; Platelet Count 142; Potassium 4.5; Sodium 140  Recent Lipid Panel    Component Value Date/Time   CHOL 98 (L) 04/05/2022 1125   CHOL 144 05/30/2012 0909   TRIG 124 04/05/2022 1125   TRIG 140 05/20/2014 1103   TRIG 153 (H) 05/30/2012 0909   HDL 46 04/05/2022 1125   HDL 42 05/20/2014 1103   HDL 36 (L) 05/30/2012 0909   CHOLHDL 2.1 04/05/2022 1125   CHOLHDL 2.3 01/01/2013 0927   VLDL 22 01/01/2013 0927   LDLCALC 30 04/05/2022 1125   LDLCALC 77 05/30/2012 0909    Risk Assessment/Calculations:  CHA2DS2-VASc Score = 3   This indicates a 3.2% annual risk of stroke. The patient's score is based upon: CHF History: 0 HTN History: 0 Diabetes History: 0 Stroke History: 0 Vascular Disease History: 1 Age Score: 2 Gender Score: 0   Home Medications   Current Meds  Medication Sig   acyclovir (ZOVIRAX) 400 MG tablet Take 1 tablet (400 mg total) by mouth daily.   apixaban (ELIQUIS) 5 MG TABS tablet TAKE 1 TABLET TWICE A DAY   Ascorbic Acid (VITAMIN C) 1000 MG tablet Take 1,000 mg by mouth daily.   brimonidine (ALPHAGAN) 0.2 % ophthalmic solution Place 1 drop into both eyes 2 (two) times daily.    Calcium Carbonate-Vit D-Min (CALCIUM 1200 PO) Take 1,200 mg by mouth daily.   cyclobenzaprine (FLEXERIL) 5 MG tablet Take 1 tablet (5 mg total) by mouth 3 (three) times daily as needed for muscle spasms.   finasteride (PROSCAR) 5 MG tablet Take 1 tablet (5 mg total) by mouth daily.   lenalidomide (REVLIMID) 15 MG capsule TAKE 1 CAPSULE DAILY FOR 21 DAYS ON, THEN 7 DAYS OFF, REPEAT EVERY 28 DAYS   Lifitegrast (XIIDRA) 5 % SOLN Apply 1 drop to eye 2 (two) times daily.   LUMIGAN 0.01 % SOLN Place 1 drop into both eyes at  bedtime.   Magnesium 250 MG TABS Take 1 tablet by mouth daily.   meclizine (ANTIVERT) 25 MG tablet Take 1 tablet (25 mg total) by mouth 3 (three) times daily as needed for dizziness.   mometasone (ELOCON) 0.1 % cream Apply 1 Application topically 2 (two) times daily as needed (eczema).   Multiple Vitamin (MULTIVITAMIN WITH MINERALS) TABS tablet Take 1 tablet by mouth daily.    omeprazole (PRILOSEC OTC) 20 MG tablet Take 20 mg by mouth every other day.   ondansetron (ZOFRAN) 8 MG tablet Take 1 tablet (8 mg total) by mouth every 8 (eight) hours as needed for nausea or vomiting.   Polyethyl Glycol-Propyl Glycol (SYSTANE OP) Place 1 drop into both eyes daily as needed (dry eyes).   Probiotic Product (SUPERIOR PROBIOTIC) CAPS Take 1 capsule by mouth daily.   prochlorperazine (COMPAZINE) 10 MG tablet Take 1 tablet (10 mg total) by mouth every 6 (six) hours as needed for nausea or vomiting.   rosuvastatin (CRESTOR) 20 MG tablet Take 1 tablet (20 mg total) by mouth daily.   silodosin (RAPAFLO) 8 MG CAPS capsule Take 1 capsule (8 mg total) by mouth daily with breakfast.    metoprolol succinate (TOPROL-XL) 25 MG 24 hr tablet Take 0.5 tablets (12.5 mg total) by mouth 2 (two) times daily.     Review of Systems    All other systems reviewed and are otherwise negative except as noted above.  Physical Exam    VS:  BP (!) 100/58   Pulse 88   Ht 6' (1.829 m)   Wt 190 lb (86.2 kg)   SpO2 96%   BMI 25.77 kg/m  , BMI Body mass index is 25.77 kg/m.  Wt Readings from Last 3 Encounters:  07/02/22 190 lb (86.2 kg)  07/01/22 189 lb 12.8 oz (86.1 kg)  06/03/22 190 lb 12.8 oz (86.5 kg)     GEN: Well nourished, well developed, in no acute distress. HEENT: normal. Neck: Supple, no JVD, carotid bruits, or masses. Cardiac: S1/S2, irregular rhythm and regular rate, no murmurs, rubs, or gallops. No clubbing, cyanosis, edema.  Radials/PT 2+ and equal bilaterally.  Respiratory:  Respirations regular and  unlabored, clear  to auscultation bilaterally. GI: Soft, nontender, nondistended. MS: No deformity or atrophy. Skin: Warm and dry, no rash. Neuro:  Strength and sensation are intact. Psych: Normal affect.  Assessment & Plan    PAF Denies any tachycardia or palpitations.  Heart rate well-controlled today.  Due to weakness/fatigue will reduce metoprolol succinate to 12.5 mg once daily instead of twice daily.  Recent labs unremarkable.  Continue follow-up with EP.  Denies any bleeding issues on Eliquis.  Continue Eliquis 5 mg twice daily, on appropriate dosage. Heart healthy diet and regular cardiovascular exercise encouraged.   CAD Stable with no anginal symptoms. No indication for ischemic evaluation.  Not on aspirin due to being on Eliquis.  Reducing metoprolol succinate to 12.5 mg daily.  Continue rosuvastatin. Heart healthy diet and regular cardiovascular exercise encouraged.   Hypertension Blood pressure soft today.  Does admit to some weakness/fatigue-see below.  Will reduce metoprolol succinate to 12.5 mg daily.  Recent lab work overall unremarkable. Discussed to monitor BP at home at least 2 hours after medications and sitting for 5-10 minutes.  Hyperlipidemia LDL 30 03/2022.  Currently at goal.  Continue rosuvastatin. Heart healthy diet and regular cardiovascular exercise encouraged.   5.  Weakness/fatigue, suspected sleep apnea Etiology multifactorial.  STOP-BANG score 5.  Will refer to pulmonary for OSA evaluation.  Recent lab work overall unremarkable.  Continue follow-up with PCP and Hem/Onc.   Disposition: Follow up in 6 month(s) with Dina Rich, MD or APP.  Signed, Sharlene Dory, NP 07/02/2022, 9:03 AM [facility]

## 2022-07-02 NOTE — Patient Instructions (Addendum)
Medication Instructions:  Your physician has recommended you make the following change in your medication:  Reduce Metoprolol Succinate to once daily Continue all other medications as prescribed   Labwork: none  Testing/Procedures: none  Follow-Up: Your physician recommends that you schedule a follow-up appointment in:  Referral to Pulmonology   Any Other Special Instructions Will Be Listed Below (If Applicable).  If you need a refill on your cardiac medications before your next appointment, please call your pharmacy.

## 2022-07-05 ENCOUNTER — Encounter: Payer: Self-pay | Admitting: Hematology and Oncology

## 2022-07-15 ENCOUNTER — Other Ambulatory Visit: Payer: Self-pay | Admitting: Cardiology

## 2022-07-15 DIAGNOSIS — I480 Paroxysmal atrial fibrillation: Secondary | ICD-10-CM

## 2022-07-16 NOTE — Telephone Encounter (Signed)
Eliquis 5mg  refill request received. Patient is 77 years old, weight-86.2kg, Crea-1.10 on 07/01/22, Diagnosis-Afib, and last seen by Sharlene Dory, NP on 07/02/22. Dose is appropriate based on dosing criteria. Will send in refill to requested pharmacy.

## 2022-07-19 ENCOUNTER — Encounter: Payer: Self-pay | Admitting: Urology

## 2022-07-19 ENCOUNTER — Ambulatory Visit (INDEPENDENT_AMBULATORY_CARE_PROVIDER_SITE_OTHER): Payer: Medicare Other | Admitting: Urology

## 2022-07-19 VITALS — BP 127/72 | HR 76

## 2022-07-19 DIAGNOSIS — N401 Enlarged prostate with lower urinary tract symptoms: Secondary | ICD-10-CM

## 2022-07-19 DIAGNOSIS — R339 Retention of urine, unspecified: Secondary | ICD-10-CM

## 2022-07-19 DIAGNOSIS — N138 Other obstructive and reflux uropathy: Secondary | ICD-10-CM | POA: Diagnosis not present

## 2022-07-19 LAB — URINALYSIS, ROUTINE W REFLEX MICROSCOPIC
Bilirubin, UA: NEGATIVE
Glucose, UA: NEGATIVE
Ketones, UA: NEGATIVE
Leukocytes,UA: NEGATIVE
Nitrite, UA: NEGATIVE
Protein,UA: NEGATIVE
RBC, UA: NEGATIVE
Specific Gravity, UA: <1.005 — ABNORMAL LOW (ref 1.005–1.030)
Urobilinogen, Ur: 0.2 mg/dL (ref 0.2–1.0)
pH, UA: 6.5 (ref 5.0–7.5)

## 2022-07-19 MED ORDER — CIPROFLOXACIN HCL 500 MG PO TABS
500.00 mg | ORAL_TABLET | Freq: Once | ORAL | Status: AC
Start: 2022-07-19 — End: 2022-07-19
  Administered 2022-07-19: 500 mg via ORAL

## 2022-07-19 NOTE — Progress Notes (Unsigned)
post void residual=319

## 2022-07-19 NOTE — Progress Notes (Unsigned)
07/19/2022 12:03 PM   [name] 12/05/1945 [id number]  Referring provider: Dettinger, Elige Radon, MD [address] [place],  [place] [address]  Followup BPH and incomplete emptying   HPI: Mr Anthony Kelly is a 76yo here for followup for BPH with incomplete emptying. He notes no improvement with finasteride and rapaflo. IPSS 24 QOL 5.   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 04/2013 -adm with CP, elevated CRP. H/o coronary artery calcification on prior CT but nuc was negative, EF 71%.   Arthritis    Atrial fibrillation (HCC)    a. Isolated episode in the setting of acute pericarditis 04/2013. Was not placed on anticoag.   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   Metastasis to lung (HCC) dx'd 01/2007   Metastasis to lymph nodes (HCC) dx'd 03/2011   lt submandibular ln   Pain in joint, pelvic region and thigh 08/01/2013   PSVT (paroxysmal supraventricular tachycardia)    SCC (squamous cell carcinoma)    Left cheek    Surgical History: Past Surgical History:  Procedure Laterality Date   ATRIAL FIBRILLATION ABLATION N/A 02/17/2022   Procedure: ATRIAL FIBRILLATION ABLATION;  Surgeon: Maurice Small, MD;  Location: [facility] INVASIVE CV LAB;  Service: Cardiovascular;  Laterality: N/A;   CHOLECYSTECTOMY     EXPLORATORY LAPAROTOMY     EYE SURGERY Right 2016   cataract   LEFT HEART CATH AND CORONARY ANGIOGRAPHY N/A 06/05/2019    Procedure: LEFT HEART CATH AND CORONARY ANGIOGRAPHY;  Surgeon: Corky Crafts, MD;  Location: [facility] INVASIVE CV LAB;  Service: Cardiovascular;  Laterality: N/A;   LUNG LOBECTOMY     right side   LYMPH NODE BIOPSY     in groin with removal   MENISCUS REPAIR     right knee   MOHS SURGERY Left 09/2020   cheek   PROSTATE SURGERY     REFRACTIVE SURGERY Right    piece of metal removed   SUBMANDIBULAR GLAND EXCISION  04/2011   SUBMANDIBULAR GLAND EXCISION  04/30/2011   Procedure: EXCISION SUBMANDIBULAR GLAND;  Surgeon: Osborn Coho, MD;  Location: [facility] OR;  Service: ENT;  Laterality: Left;  WITH DIAGNOSTIC BIOPSY   TONSILLECTOMY     as a child   VEIN LIGATION AND STRIPPING     right leg    Home Medications:  Allergies as of 07/19/2022       Reactions   Rituximab Other (See Comments)   2015 pt had reaction        Medication List        Accurate as of July 19, 2022 12:03 PM. If you have any questions, ask your nurse or doctor.          acyclovir 400 MG tablet Commonly known as: ZOVIRAX Take 1 tablet (400 mg total) by mouth daily.   brimonidine 0.2 % ophthalmic solution Commonly known as: ALPHAGAN Place  1 drop into both eyes 2 (two) times daily.   CALCIUM 1200 PO Take 1,200 mg by mouth daily.   cyclobenzaprine 5 MG tablet Commonly known as: FLEXERIL Take 1 tablet (5 mg total) by mouth 3 (three) times daily as needed for muscle spasms.   Eliquis 5 MG Tabs tablet Generic drug: apixaban TAKE 1 TABLET TWICE A DAY   finasteride 5 MG tablet Commonly known as: PROSCAR Take 1 tablet (5 mg total) by mouth daily.   lenalidomide 15 MG capsule Commonly known as: Revlimid TAKE 1 CAPSULE DAILY FOR 21 DAYS ON, THEN 7 DAYS OFF, REPEAT EVERY 28 DAYS   Lumigan 0.01 % Soln Generic drug: bimatoprost Place 1 drop into both eyes at bedtime.   Magnesium 250 MG Tabs Take 1 tablet by mouth daily.   meclizine 25 MG tablet Commonly known as: ANTIVERT Take 1 tablet (25 mg  total) by mouth 3 (three) times daily as needed for dizziness.   metoprolol succinate 25 MG 24 hr tablet Commonly known as: TOPROL-XL Take 0.5 tablets (12.5 mg total) by mouth daily.   mometasone 0.1 % cream Commonly known as: ELOCON Apply 1 Application topically 2 (two) times daily as needed (eczema).   multivitamin with minerals Tabs tablet Take 1 tablet by mouth daily.   omeprazole 20 MG tablet Commonly known as: PRILOSEC OTC Take 20 mg by mouth every other day.   ondansetron 8 MG tablet Commonly known as: Zofran Take 1 tablet (8 mg total) by mouth every 8 (eight) hours as needed for nausea or vomiting.   prochlorperazine 10 MG tablet Commonly known as: COMPAZINE Take 1 tablet (10 mg total) by mouth every 6 (six) hours as needed for nausea or vomiting.   rosuvastatin 20 MG tablet Commonly known as: CRESTOR Take 1 tablet (20 mg total) by mouth daily.   silodosin 8 MG Caps capsule Commonly known as: RAPAFLO Take 1 capsule (8 mg total) by mouth daily with breakfast.   Superior Probiotic Caps Take 1 capsule by mouth daily.   SYSTANE OP Place 1 drop into both eyes daily as needed (dry eyes).   vitamin C 1000 MG tablet Take 1,000 mg by mouth daily.   Xiidra 5 % Soln Generic drug: Lifitegrast Apply 1 drop to eye 2 (two) times daily.        Allergies:  Allergies  Allergen Reactions   Rituximab Other (See Comments)    2015 pt had reaction    Family History: Family History  Problem Relation Age of Onset   Heart disease Father    Heart attack Father        x 3   Cancer Father    Cancer Sister        breast ca   Cancer Brother        prostate ca   Heart attack Sister    Cancer Sister        breast   Heart disease Brother    Alzheimer's disease Sister    Diabetes Sister    Heart disease Brother    Diabetes Brother    Heart disease Brother    Heart disease Brother    Heart disease Brother    Heart disease Brother    Heart disease Brother     Alzheimer's disease Brother    Diabetes Son    Cancer Brother    Anesthesia problems Neg Hx     Social History:  reports that he quit smoking about 37 years ago. His smoking  use included cigarettes. He started smoking about 59 years ago. He has a 15.00 pack-year smoking history. He has never used smokeless tobacco. He reports that he does not currently use alcohol. He reports that he does not use drugs.  ROS: All other review of systems were reviewed and are negative except what is noted above in HPI  Physical Exam: BP 127/72   Pulse 76   Constitutional:  Alert and oriented, No acute distress. HEENT: [place] AT, moist mucus membranes.  Trachea midline, no masses. Cardiovascular: No clubbing, cyanosis, or edema. Respiratory: Normal respiratory effort, no increased work of breathing. GI: Abdomen is soft, nontender, nondistended, no abdominal masses GU: No CVA tenderness.  Lymph: No cervical or inguinal lymphadenopathy. Skin: No rashes, bruises or suspicious lesions. Neurologic: Grossly intact, no focal deficits, moving all 4 extremities. Psychiatric: Normal mood and affect.  Laboratory Data: Lab Results  Component Value Date   WBC 6.1 07/01/2022   HGB 15.4 07/01/2022   HCT 46.1 07/01/2022   MCV 88.7 07/01/2022   PLT 142 (L) 07/01/2022    Lab Results  Component Value Date   CREATININE 1.10 07/01/2022    Lab Results  Component Value Date   PSA 0.6 01/18/2014    Lab Results  Component Value Date   TESTOSTERONE 269 10/08/2015    Lab Results  Component Value Date   HGBA1C 6.2 (H) 04/05/2022    Urinalysis    Component Value Date/Time   COLORURINE YELLOW 12/07/2013 1630   APPEARANCEUR Clear 01/22/2022 1143   LABSPEC 1.009 12/07/2013 1630   PHURINE 6.0 12/07/2013 1630   GLUCOSEU Negative 01/22/2022 1143   HGBUR NEGATIVE 12/07/2013 1630   BILIRUBINUR Negative 01/22/2022 1143   KETONESUR NEGATIVE 12/07/2013 1630   PROTEINUR Negative 01/22/2022 1143   PROTEINUR  NEGATIVE 12/07/2013 1630   UROBILINOGEN negative 08/09/2014 1207   UROBILINOGEN 0.2 12/07/2013 1630   NITRITE Negative 01/22/2022 1143   NITRITE NEGATIVE 12/07/2013 1630   LEUKOCYTESUR Negative 01/22/2022 1143    Lab Results  Component Value Date   LABMICR <3.0 04/05/2022   WBCUA None seen 06/07/2017   RBCUA 0-2 06/07/2017   LABEPIT None seen 06/07/2017   MUCUS neg 08/09/2014   BACTERIA None seen 06/07/2017    Pertinent Imaging:  No results found for this or any previous visit.  No results found for this or any previous visit.  No results found for this or any previous visit.  No results found for this or any previous visit.  No results found for this or any previous visit.  No valid procedures specified. No results found for this or any previous visit.  No results found for this or any previous visit.     Cystoscopy Procedure Note  Patient identification was confirmed, informed consent was obtained, and patient was prepped using Betadine solution.  Lidocaine jelly was administered per urethral meatus.     Pre-Procedure: - Inspection reveals a normal caliber ureteral meatus.  Procedure: The flexible cystoscope was introduced without difficulty - No urethral strictures/lesions are present. - Enlarged prostate large median lobe - Normal bladder neck - Bilateral ureteral orifices identified - Bladder mucosa  reveals no ulcers, tumors, or lesions - No bladder stones - No trabeculation  Retroflexion shows 1cm intravesical prostatic protrusion   Post-Procedure: - Patient tolerated the procedure well    Assessment & Plan:    1. Incomplete bladder emptying -continue rapaflo 8mg  and finasteride 5mg   - BLADDER SCAN AMB NON-IMAGING - Urinalysis, Routine w reflex microscopic  2.  BPH with obstruction/lower urinary tract symptoms We discussed the management of his BPH including continued medical therapy, Rezum, Urolift, TURP and simple prostatectomy. After  discussing the options the patient has elected to proceed with TURP. Risks/benefits/alternatives discussed.  - Urinalysis, Routine w reflex microscopic   No follow-ups on file.  Wilkie Aye, MD  [facility] [place]

## 2022-07-20 ENCOUNTER — Encounter: Payer: Self-pay | Admitting: Urology

## 2022-07-20 DIAGNOSIS — D485 Neoplasm of uncertain behavior of skin: Secondary | ICD-10-CM | POA: Diagnosis not present

## 2022-07-20 DIAGNOSIS — L821 Other seborrheic keratosis: Secondary | ICD-10-CM | POA: Diagnosis not present

## 2022-07-20 DIAGNOSIS — Z129 Encounter for screening for malignant neoplasm, site unspecified: Secondary | ICD-10-CM | POA: Diagnosis not present

## 2022-07-20 DIAGNOSIS — Z86008 Personal history of in-situ neoplasm of other site: Secondary | ICD-10-CM | POA: Diagnosis not present

## 2022-07-20 DIAGNOSIS — L570 Actinic keratosis: Secondary | ICD-10-CM | POA: Diagnosis not present

## 2022-07-20 NOTE — Patient Instructions (Signed)

## 2022-07-21 ENCOUNTER — Encounter: Payer: Self-pay | Admitting: Cardiovascular Disease

## 2022-07-21 ENCOUNTER — Encounter: Payer: Self-pay | Admitting: Hematology and Oncology

## 2022-07-21 NOTE — Telephone Encounter (Signed)
Spoke with Kendal Hymen (wife, DPR on file) patient going in and out of afib, episodes only lasting 3-5 minutes, heart rate staying in 90's during episodes, some shortness of breath during most episodes but clears up after episode has ended. Patient not currently having an episode and heart rate in 70's. Asked what, if anything I can help them with today but they just wanted to make sure we were aware he was still having episodes. Did not want to move up appointment or want any medication changes at this time.

## 2022-07-22 ENCOUNTER — Other Ambulatory Visit: Payer: Self-pay

## 2022-07-22 DIAGNOSIS — N138 Other obstructive and reflux uropathy: Secondary | ICD-10-CM

## 2022-07-23 ENCOUNTER — Other Ambulatory Visit: Payer: Self-pay | Admitting: *Deleted

## 2022-07-23 MED ORDER — LENALIDOMIDE 15 MG PO CAPS: ORAL_CAPSULE | ORAL | 0 refills | Status: DC

## 2022-07-26 ENCOUNTER — Telehealth: Payer: Self-pay

## 2022-07-26 ENCOUNTER — Telehealth: Payer: Self-pay | Admitting: *Deleted

## 2022-07-26 NOTE — Telephone Encounter (Signed)
I spoke with patient's wife she is asking if Dr. Ronne Binning will need clearance from Dr. Nelly Laurence, Cardio Electorophysiology or Dr. Bertis Ruddy, Oncology?  Cardio clearance to Dr. Marcina Millard currently pending. Please advise.     I spoke with Anthony Kelly. We have discussed possible surgery dates and 09/09/2022 was agreed upon by all parties. Patient given information about surgery date, what to expect pre-operatively and post operatively.    We discussed that a pre-op nurse will be calling to set up the pre-op visit that will take place prior to surgery. Informed patient that our office will communicate any additional care to be provided after surgery.    Patients questions or concerns were discussed during our call. Advised to call our office should there be any additional information, questions or concerns that arise. Patient verbalized understanding.

## 2022-07-26 NOTE — Telephone Encounter (Signed)
   Pre-operative Risk Assessment    Patient Name: Anthony Kelly  DOB: 12/05/1945 MRN: [id number]      Request for Surgical Clearance    Procedure:   TRANSURETHRAL RESECTION OF PROSTATE (TURP)  Date of Surgery:  Clearance 09/09/22                                 Surgeon:  DR. Ronne Binning Surgeon's Group or Practice Name:  [facility] [place] Phone number:  [phone] Fax number:  [phone]   Type of Clearance Requested:   - Medical  - Pharmacy:  Hold Apixaban (Eliquis) x 2 DAYS PRIOR PROCEDURE AND HOLD x 3 DAYS POST PROCEDURE   Type of Anesthesia:  General    Additional requests/questions:    [name]   07/26/2022, 4:38 PM

## 2022-07-27 ENCOUNTER — Encounter: Payer: Self-pay | Admitting: Hematology and Oncology

## 2022-07-27 NOTE — Telephone Encounter (Signed)
Clearances sent to Dr. Nelly Laurence, Dr. Wyline Mood, and Dr. Bertis Ruddy.  Clearances pending at this time.

## 2022-07-27 NOTE — Telephone Encounter (Signed)
Patient with diagnosis of afib on Eliquis for anticoagulation.    Procedure: TURP Date of procedure: 09/09/22  CHA2DS2-VASc Score = 4  This indicates a 4.8% annual risk of stroke. The patient's score is based upon: CHF History: 0 HTN History: 1 Diabetes History: 0 Stroke History: 0 Vascular Disease History: 1 Age Score: 2 Gender Score: 0   S/p afib ablation 02/17/22.  CrCl 75mL/min Platelet count 142K  Per office protocol, patient can hold Eliquis as requested for 2 days prior to procedure and resume 3 days post procedure.    **This guidance is not considered finalized until pre-operative APP has relayed final recommendations.**

## 2022-07-28 NOTE — Telephone Encounter (Signed)
     Primary Cardiologist: Dina Rich, MD  Chart reviewed as part of pre-operative protocol coverage. Given past medical history and time since last visit, based on ACC/AHA guidelines, Anthony Kelly would be at acceptable risk for the planned procedure without further cardiovascular testing.   Patient with diagnosis of afib on Eliquis for anticoagulation.     Procedure: TURP Date of procedure: 09/09/22   CHA2DS2-VASc Score = 4  This indicates a 4.8% annual risk of stroke. The patient's score is based upon: CHF History: 0 HTN History: 1 Diabetes History: 0 Stroke History: 0 Vascular Disease History: 1 Age Score: 2 Gender Score: 0   S/p afib ablation 02/17/22.   CrCl 45mL/min Platelet count 142K   Per office protocol, patient can hold Eliquis as requested for 2 days prior to procedure and resume 3 days post procedure.    I will route this recommendation to the requesting party via Epic fax function and remove from pre-op pool.  Please call with questions.  Thomasene Ripple. Milbert Bixler NP-C     07/28/2022, 8:23 AM [facility] [address] Office [phone] Fax [phone]

## 2022-07-29 ENCOUNTER — Inpatient Hospital Stay (HOSPITAL_BASED_OUTPATIENT_CLINIC_OR_DEPARTMENT_OTHER): Payer: Medicare Other | Admitting: Hematology and Oncology

## 2022-07-29 ENCOUNTER — Encounter: Payer: Self-pay | Admitting: Cardiovascular Disease

## 2022-07-29 ENCOUNTER — Encounter: Payer: Self-pay | Admitting: Hematology and Oncology

## 2022-07-29 ENCOUNTER — Inpatient Hospital Stay: Payer: Medicare Other | Attending: Hematology and Oncology

## 2022-07-29 ENCOUNTER — Other Ambulatory Visit: Payer: Self-pay

## 2022-07-29 ENCOUNTER — Inpatient Hospital Stay: Payer: Medicare Other

## 2022-07-29 VITALS — BP 133/68 | HR 74 | Temp 98.2°F | Resp 18 | Ht 72.0 in | Wt 185.8 lb

## 2022-07-29 VITALS — BP 104/64 | HR 58 | Resp 16

## 2022-07-29 DIAGNOSIS — C8288 Other types of follicular lymphoma, lymph nodes of multiple sites: Secondary | ICD-10-CM | POA: Insufficient documentation

## 2022-07-29 DIAGNOSIS — R110 Nausea: Secondary | ICD-10-CM | POA: Insufficient documentation

## 2022-07-29 DIAGNOSIS — N401 Enlarged prostate with lower urinary tract symptoms: Secondary | ICD-10-CM

## 2022-07-29 DIAGNOSIS — C8280 Other types of follicular lymphoma, unspecified site: Secondary | ICD-10-CM | POA: Diagnosis not present

## 2022-07-29 DIAGNOSIS — R350 Frequency of micturition: Secondary | ICD-10-CM

## 2022-07-29 DIAGNOSIS — N400 Enlarged prostate without lower urinary tract symptoms: Secondary | ICD-10-CM | POA: Insufficient documentation

## 2022-07-29 DIAGNOSIS — Z5112 Encounter for antineoplastic immunotherapy: Secondary | ICD-10-CM | POA: Insufficient documentation

## 2022-07-29 DIAGNOSIS — I480 Paroxysmal atrial fibrillation: Secondary | ICD-10-CM

## 2022-07-29 LAB — CBC WITH DIFFERENTIAL (CANCER CENTER ONLY)
Abs Immature Granulocytes: 0.02 10*3/uL (ref 0.00–0.07)
Basophils Absolute: 0.1 10*3/uL (ref 0.0–0.1)
Basophils Relative: 1 %
Eosinophils Absolute: 0.8 10*3/uL — ABNORMAL HIGH (ref 0.0–0.5)
Eosinophils Relative: 14 %
HCT: 46.2 % (ref 39.0–52.0)
Hemoglobin: 15.5 g/dL (ref 13.0–17.0)
Immature Granulocytes: 0 %
Lymphocytes Relative: 22 %
Lymphs Abs: 1.3 10*3/uL (ref 0.7–4.0)
MCH: 29 pg (ref 26.0–34.0)
MCHC: 33.5 g/dL (ref 30.0–36.0)
MCV: 86.5 fL (ref 80.0–100.0)
Monocytes Absolute: 1 10*3/uL (ref 0.1–1.0)
Monocytes Relative: 16 %
Neutro Abs: 2.8 10*3/uL (ref 1.7–7.7)
Neutrophils Relative %: 47 %
Platelet Count: 160 10*3/uL (ref 150–400)
RBC: 5.34 MIL/uL (ref 4.22–5.81)
RDW: 13.7 % (ref 11.5–15.5)
Smear Review: NORMAL
WBC Count: 5.9 10*3/uL (ref 4.0–10.5)
nRBC: 0 % (ref 0.0–0.2)

## 2022-07-29 LAB — CMP (CANCER CENTER ONLY)
ALT: 19 U/L (ref 0–44)
AST: 15 U/L (ref 15–41)
Albumin: 4 g/dL (ref 3.5–5.0)
Alkaline Phosphatase: 67 U/L (ref 38–126)
Anion gap: 10 (ref 5–15)
BUN: 15 mg/dL (ref 8–23)
CO2: 24 mmol/L (ref 22–32)
Calcium: 9 mg/dL (ref 8.9–10.3)
Chloride: 105 mmol/L (ref 98–111)
Creatinine: 1.07 mg/dL (ref 0.61–1.24)
GFR, Estimated: >60 mL/min (ref >60)
Glucose, Bld: 192 mg/dL — ABNORMAL HIGH (ref 70–99)
Potassium: 3.9 mmol/L (ref 3.5–5.1)
Sodium: 139 mmol/L (ref 135–145)
Total Bilirubin: 0.7 mg/dL (ref 0.3–1.2)
Total Protein: 6.4 g/dL — ABNORMAL LOW (ref 6.5–8.1)

## 2022-07-29 MED ORDER — DIPHENHYDRAMINE HCL 25 MG PO CAPS
25.0000 mg | ORAL_CAPSULE | Freq: Once | ORAL | Status: AC
  Administered 2022-07-29: 25 mg via ORAL
  Filled 2022-07-29: qty 1

## 2022-07-29 MED ORDER — SODIUM CHLORIDE 0.9 % IV SOLN
375.0000 mg/m2 | Freq: Once | INTRAVENOUS | Status: AC
  Administered 2022-07-29: 800 mg via INTRAVENOUS
  Filled 2022-07-29: qty 50

## 2022-07-29 MED ORDER — FAMOTIDINE IN NACL 20-0.9 MG/50ML-% IV SOLN
20.0000 mg | Freq: Once | INTRAVENOUS | Status: AC
  Administered 2022-07-29: 20 mg via INTRAVENOUS
  Filled 2022-07-29: qty 50

## 2022-07-29 MED ORDER — MONTELUKAST SODIUM 10 MG PO TABS
10.0000 mg | ORAL_TABLET | Freq: Once | ORAL | Status: AC
  Administered 2022-07-29: 10 mg via ORAL
  Filled 2022-07-29: qty 1

## 2022-07-29 MED ORDER — ACETAMINOPHEN 325 MG PO TABS
650.0000 mg | ORAL_TABLET | Freq: Once | ORAL | Status: AC
  Administered 2022-07-29: 650 mg via ORAL
  Filled 2022-07-29: qty 2

## 2022-07-29 MED ORDER — SODIUM CHLORIDE 0.9 % IV SOLN: Freq: Once | INTRAVENOUS | Status: AC

## 2022-07-29 NOTE — Patient Instructions (Signed)
[facility]  Discharge Instructions: Thank you for choosing [facility] to provide your oncology and hematology care.   If you have a lab appointment with the Cancer Center, please go directly to the Cancer Center and check in at the registration area.   Wear comfortable clothing and clothing appropriate for easy access to any Portacath or PICC line.   We strive to give you quality time with your provider. You may need to reschedule your appointment if you arrive late (15 or more minutes).  Arriving late affects you and other patients whose appointments are after yours.  Also, if you miss three or more appointments without notifying the office, you may be dismissed from the clinic at the provider's discretion.      For prescription refill requests, have your pharmacy contact our office and allow 72 hours for refills to be completed.    Today you received the following chemotherapy and/or immunotherapy agents :  Rituximab. (Ruxience).   To help prevent nausea and vomiting after your treatment, we encourage you to take your nausea medication as directed.  BELOW ARE SYMPTOMS THAT SHOULD BE REPORTED IMMEDIATELY: *FEVER GREATER THAN 100.4 F (38 C) OR HIGHER *CHILLS OR SWEATING *NAUSEA AND VOMITING THAT IS NOT CONTROLLED WITH YOUR NAUSEA MEDICATION *UNUSUAL SHORTNESS OF BREATH *UNUSUAL BRUISING OR BLEEDING *URINARY PROBLEMS (pain or burning when urinating, or frequent urination) *BOWEL PROBLEMS (unusual diarrhea, constipation, pain near the anus) TENDERNESS IN MOUTH AND THROAT WITH OR WITHOUT PRESENCE OF ULCERS (sore throat, sores in mouth, or a toothache) UNUSUAL RASH, SWELLING OR PAIN  UNUSUAL VAGINAL DISCHARGE OR ITCHING   Items with * indicate a potential emergency and should be followed up as soon as possible or go to the Emergency Department if any problems should occur.  Please show the CHEMOTHERAPY ALERT CARD or IMMUNOTHERAPY ALERT  CARD at check-in to the Emergency Department and triage nurse.  Should you have questions after your visit or need to cancel or reschedule your appointment, please contact [facility]  Dept: [phone]  and follow the prompts.  Office hours are 8:00 a.m. to 4:30 p.m. Monday - Friday. Please note that voicemails left after 4:00 p.m. may not be returned until the following business day.  We are closed weekends and major holidays. You have access to a nurse at all times for urgent questions. Please call the main number to the clinic Dept: [phone] and follow the prompts.   For any non-urgent questions, you may also contact your provider using MyChart. We now offer e-Visits for anyone 45 and older to request care online for non-urgent symptoms. For details visit [email or web address].   Also download the MyChart app! Go to the app store, search "MyChart", open the app, select [place], and log in with your MyChart username and password.  Rituximab Injection What is this medication? RITUXIMAB (ri TUX i mab) treats leukemia and lymphoma. It works by blocking a protein that causes cancer cells to grow and multiply. This helps to slow or stop the spread of cancer cells. It may also be used to treat autoimmune conditions, such as arthritis. It works by slowing down an overactive immune system. It is a monoclonal antibody. This medicine may be used for other purposes; ask your health care provider or pharmacist if you have questions. COMMON BRAND NAME(S): RIABNI, Rituxan, RUXIENCE, truxima What should I tell my care team before I take this medication? They need  to know if you have any of these conditions: Chest pain Heart disease Immune system problems Infection, such as chickenpox, cold sores, hepatitis B, herpes Irregular heartbeat or rhythm Kidney disease Low blood counts, such as low white cells, platelets, red cells Lung disease Recent or upcoming  vaccine An unusual or allergic reaction to rituximab, other medications, foods, dyes, or preservatives Pregnant or trying to get pregnant Breast-feeding How should I use this medication? This medication is injected into a vein. It is given by a care team in a hospital or clinic setting. A special MedGuide will be given to you before each treatment. Be sure to read this information carefully each time. Talk to your care team about the use of this medication in children. While this medication may be prescribed for children as young as 6 months for selected conditions, precautions do apply. Overdosage: If you think you have taken too much of this medicine contact a poison control center or emergency room at once. NOTE: This medicine is only for you. Do not share this medicine with others. What if I miss a dose? Keep appointments for follow-up doses. It is important not to miss your dose. Call your care team if you are unable to keep an appointment. What may interact with this medication? Do not take this medication with any of the following: Live vaccines This medication may also interact with the following: Cisplatin This list may not describe all possible interactions. Give your health care provider a list of all the medicines, herbs, non-prescription drugs, or dietary supplements you use. Also tell them if you smoke, drink alcohol, or use illegal drugs. Some items may interact with your medicine. What should I watch for while using this medication? Your condition will be monitored carefully while you are receiving this medication. You may need blood work while taking this medication. This medication can cause serious infusion reactions. To reduce the risk your care team may give you other medications to take before receiving this one. Be sure to follow the directions from your care team. This medication may increase your risk of getting an infection. Call your care team for advice if you get a  fever, chills, sore throat, or other symptoms of a cold or flu. Do not treat yourself. Try to avoid being around people who are sick. Call your care team if you are around anyone with measles, chickenpox, or if you develop sores or blisters that do not heal properly. Avoid taking medications that contain aspirin, acetaminophen, ibuprofen, naproxen, or ketoprofen unless instructed by your care team. These medications may hide a fever. This medication may cause serious skin reactions. They can happen weeks to months after starting the medication. Contact your care team right away if you notice fevers or flu-like symptoms with a rash. The rash may be red or purple and then turn into blisters or peeling of the skin. You may also notice a red rash with swelling of the face, lips, or lymph nodes in your neck or under your arms. In some patients, this medication may cause a serious brain infection that may cause death. If you have any problems seeing, thinking, speaking, walking, or standing, tell your care team right away. If you cannot reach your care team, urgently seek another source of medical care. Talk to your care team if you may be pregnant. Serious birth defects can occur if you take this medication during pregnancy and for 12 months after the last dose. You will need a negative pregnancy  test before starting this medication. Contraception is recommended while taking this medication and for 12 months after the last dose. Your care team can help you find the option that works for you. Do not breastfeed while taking this medication and for at least 6 months after the last dose. What side effects may I notice from receiving this medication? Side effects that you should report to your care team as soon as possible: Allergic reactions or angioedema--skin rash, itching or hives, swelling of the face, eyes, lips, tongue, arms, or legs, trouble swallowing or breathing Bowel blockage--stomach cramping, unable to  have a bowel movement or pass gas, loss of appetite, vomiting Dizziness, loss of balance or coordination, confusion or trouble speaking Heart attack--pain or tightness in the chest, shoulders, arms, or jaw, nausea, shortness of breath, cold or clammy skin, feeling faint or lightheaded Heart rhythm changes--fast or irregular heartbeat, dizziness, feeling faint or lightheaded, chest pain, trouble breathing Infection--fever, chills, cough, sore throat, wounds that don't heal, pain or trouble when passing urine, general feeling of discomfort or being unwell Infusion reactions--chest pain, shortness of breath or trouble breathing, feeling faint or lightheaded Kidney injury--decrease in the amount of urine, swelling of the ankles, hands, or feet Liver injury--right upper belly pain, loss of appetite, nausea, light-colored stool, dark yellow or brown urine, yellowing skin or eyes, unusual weakness or fatigue Redness, blistering, peeling, or loosening of the skin, including inside the mouth Stomach pain that is severe, does not go away, or gets worse Tumor lysis syndrome (TLS)--nausea, vomiting, diarrhea, decrease in the amount of urine, dark urine, unusual weakness or fatigue, confusion, muscle pain or cramps, fast or irregular heartbeat, joint pain Side effects that usually do not require medical attention (report to your care team if they continue or are bothersome): Headache Joint pain Nausea Runny or stuffy nose Unusual weakness or fatigue This list may not describe all possible side effects. Call your doctor for medical advice about side effects. You may report side effects to FDA at 1-800-FDA-1088. Where should I keep my medication? This medication is given in a hospital or clinic. It will not be stored at home. NOTE: This sheet is a summary. It may not cover all possible information. If you have questions about this medicine, talk to your doctor, pharmacist, or health care provider.  2024  Elsevier/Gold Standard (2021-05-21 00:00:00)

## 2022-07-29 NOTE — Assessment & Plan Note (Signed)
Overall, he tolerated treatment fairly well without major side effects His recent CT imaging showed excellent response to therapy I recommend we continue treatment for few more cycles with plan to repeat imaging study again in September The goal would be to continue treatment for up to 2 years from the date of his normal imaging

## 2022-07-29 NOTE — Assessment & Plan Note (Signed)
He has intermittent nausea without vomiting I do not believe this is caused by Revlimid He has antiemetics to take as needed I recommend the patient to switch the timing of lenalidomide to evening time

## 2022-07-29 NOTE — Progress Notes (Signed)
[facility] OFFICE PROGRESS NOTE  Patient Care Team: Dettinger, Elige Radon, MD as PCP - General (Family Medicine) Wyline Mood, Dorothe Pea, MD as PCP - Cardiology (Cardiology) Mealor, Roberts Gaudy, MD as PCP - Electrophysiology (Cardiology) Iva Boop, MD as Consulting Physician (Gastroenterology) Artis Delay, MD as Consulting Physician (Hematology and Oncology) Cherlyn Roberts, MD as Consulting Physician (Dermatology)  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HCC) Overall, he tolerated treatment fairly well without major side effects His recent CT imaging showed excellent response to therapy I recommend we continue treatment for few more cycles with plan to repeat imaging study again in September The goal would be to continue treatment for up to 2 years from the date of his normal imaging   Benign prostate hyperplasia He was seen by urologist with plan for transurethral resection of the prostate on September 09, 2022 From hematology perspective, there is no contraindication for him to proceed I recommend the patient to stop Revlimid 7 days prior to the procedure, meaning on September 02, 2022 and to resume 48 hours after his procedure I will put this recommendation in writing I will reach out to his urologist regarding this The patient would still need separate cardiology clearance given his history of atrial fibrillation and ongoing need for anticoagulation therapy  Nausea without vomiting He has intermittent nausea without vomiting I do not believe this is caused by Revlimid He has antiemetics to take as needed I recommend the patient to switch the timing of lenalidomide to evening time  No orders of the defined types were placed in this encounter.   All questions were answered. The patient knows to call the clinic with any problems, questions or concerns. The total time spent in the appointment was 40 minutes encounter with patients including review of  chart and various tests results, discussions about plan of care and coordination of care plan   Artis Delay, MD 07/29/2022 10:14 AM  INTERVAL HISTORY: Please see below for problem oriented charting. he returns for treatment follow-up with his wife I have received a letter from urologist team regarding surgical clearance for procedure next month He tolerated treatment well except for intermittent nausea without vomiting He denies constipation He has intermittent atrial fibrillation but remained asymptomatic  REVIEW OF SYSTEMS:   Constitutional: Denies fevers, chills or abnormal weight loss Eyes: Denies blurriness of vision Ears, nose, mouth, throat, and face: Denies mucositis or sore throat Respiratory: Denies cough, dyspnea or wheezes Cardiovascular: Denies palpitation, chest discomfort or lower extremity swelling Skin: Denies abnormal skin rashes Lymphatics: Denies new lymphadenopathy or easy bruising Neurological:Denies numbness, tingling or new weaknesses Behavioral/Psych: Mood is stable, no new changes  All other systems were reviewed with the patient and are negative.  I have reviewed the past medical history, past surgical history, social history and family history with the patient and they are unchanged from previous note.  ALLERGIES:  is allergic to rituximab.  MEDICATIONS:  Current Outpatient Medications  Medication Sig Dispense Refill   acyclovir (ZOVIRAX) 400 MG tablet Take 1 tablet (400 mg total) by mouth daily. 90 tablet 1   apixaban (ELIQUIS) 5 MG TABS tablet TAKE 1 TABLET TWICE A DAY 180 tablet 2   Ascorbic Acid (VITAMIN C) 1000 MG tablet Take 1,000 mg by mouth daily.     brimonidine (ALPHAGAN) 0.2 % ophthalmic solution Place 1 drop into both eyes 2 (two) times daily.      Calcium Carbonate-Vit D-Min (CALCIUM 1200 PO) Take 1,200 mg  by mouth daily.     cyclobenzaprine (FLEXERIL) 5 MG tablet Take 1 tablet (5 mg total) by mouth 3 (three) times daily as needed for muscle  spasms. 30 tablet 0   finasteride (PROSCAR) 5 MG tablet Take 1 tablet (5 mg total) by mouth daily. 90 tablet 3   lenalidomide (REVLIMID) 15 MG capsule TAKE 1 CAPSULE DAILY FOR 21 DAYS ON, THEN 7 DAYS OFF, REPEAT EVERY 28 DAYS 21 capsule 0   Lifitegrast (XIIDRA) 5 % SOLN Apply 1 drop to eye 2 (two) times daily.     LUMIGAN 0.01 % SOLN Place 1 drop into both eyes at bedtime.     Magnesium 250 MG TABS Take 1 tablet by mouth daily.     meclizine (ANTIVERT) 25 MG tablet Take 1 tablet (25 mg total) by mouth 3 (three) times daily as needed for dizziness. 30 tablet 0   metoprolol succinate (TOPROL-XL) 25 MG 24 hr tablet Take 0.5 tablets (12.5 mg total) by mouth daily. 90 tablet 3   mometasone (ELOCON) 0.1 % cream Apply 1 Application topically 2 (two) times daily as needed (eczema).     Multiple Vitamin (MULTIVITAMIN WITH MINERALS) TABS tablet Take 1 tablet by mouth daily.      omeprazole (PRILOSEC OTC) 20 MG tablet Take 20 mg by mouth every other day.     ondansetron (ZOFRAN) 8 MG tablet Take 1 tablet (8 mg total) by mouth every 8 (eight) hours as needed for nausea or vomiting. 30 tablet 1   Polyethyl Glycol-Propyl Glycol (SYSTANE OP) Place 1 drop into both eyes daily as needed (dry eyes).     Probiotic Product (SUPERIOR PROBIOTIC) CAPS Take 1 capsule by mouth daily.     prochlorperazine (COMPAZINE) 10 MG tablet Take 1 tablet (10 mg total) by mouth every 6 (six) hours as needed for nausea or vomiting. 30 tablet 1   rosuvastatin (CRESTOR) 20 MG tablet Take 1 tablet (20 mg total) by mouth daily. 90 tablet 3   silodosin (RAPAFLO) 8 MG CAPS capsule Take 1 capsule (8 mg total) by mouth daily with breakfast. 90 capsule 3   No current facility-administered medications for this visit.   Facility-Administered Medications Ordered in Other Visits  Medication Dose Route Frequency Provider Last Rate Last Admin   riTUXimab-pvvr (RUXIENCE) 800 mg in sodium chloride 0.9 % 250 mL (2.4242 mg/mL) infusion  375 mg/m2  (Treatment Plan Recorded) Intravenous Once Artis Delay, MD        SUMMARY OF ONCOLOGIC HISTORY: Oncology History Overview Note  Low grade B-cell lymphoma   Primary site: Lymphoid Neoplasms   Staging method: AJCC 6th Edition   Clinical: Stage IV signed by Artis Delay, MD on 09/26/2013  9:15 AM   Summary: Stage IV      Follicular low grade B-cell lymphoma (HCC)  12/10/2003 Surgery   Inguinal lymph node biopsy showed follicular lymphoma.   12/12/2003 - 06/01/2004 Chemotherapy   He was treated with R. CHOP chemotherapy which show complete remission. The number of cycles of R. CHOP chemotherapy was unknown.   12/19/2006 Surgery   Lung resection show follicular lymphoma.   01/02/2007 - 09/01/2008 Chemotherapy   The patient was treated with single agent rituximab alone.   01/19/2007 Bone Marrow Biopsy   Bone marrow biopsy was negative.   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   05/08/2013 - 05/11/2013 Hospital Admission   The patient was admitted to the hospital for management of pericarditis. CT scan showed extensive  lymphadenopathy.   06/07/2013 Imaging   PET/CT scan showed extensive lymphadenopathy   06/25/2013 Procedure   He has placement of Infuse-a-Port.   06/28/2013 Bone Marrow Biopsy   Bone marrow biopsy is positive for lymphoma involvement.   07/04/2013 - 11/22/2013 Chemotherapy   He is treated with 6 cycles of bendamustine with rituximab.   09/24/2013 Imaging   Repeat PET scan show complete remission.   12/26/2013 Imaging   PEt scan showed complete remission   09/26/2014 Imaging   CT scan of the chest abdomen and pelvis show no evidence of disease   03/26/2015 Imaging   CT scan showed no evidence of lymphoma   04/14/2016 Imaging   CT: Borderline prominent right hilar and subcarinal lymph nodes, but not appreciably changed. 2. Low-grade but increased central mesenteric stranding with some small mesenteric lymph nodes. This could  certainly be inflammatory, 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, much of which is likely a physiologic response to cholecystectomy. 7. Sigmoid colon diverticulosis. 8.  Prominent stool throughout the colon favors constipation. 9. Enlarged prostate gland, volume estimated at 75 cubic cm.   02/15/2017 Imaging   No evidence of recurrent lymphoma or other acute findings.  Stable moderate hiatal hernia.  Colonic diverticulosis, without radiographic evidence of diverticulitis.  Stable mildly enlarged prostate.  Mild emphysema.  Aortic and coronary artery atherosclerosis.   01/31/2019 Imaging   1. Interval development of abdominal and pelvic adenopathy compatible with recurrent lymphoma. 2. Emphysema and aortic atherosclerosis. 3. Multi vessel coronary artery calcifications. 4. Moderate to large hiatal hernia   02/09/2019 Procedure   Successful CT-guided core biopsy of the left retroperitoneal periaortic adenopathy   02/09/2019 Pathology Results   SURGICAL PATHOLOGY  CASE: [id number]  PATIENT: Anthony Kelly  Surgical Pathology Report   Clinical History: Lymphoma; [name], LEFT PARA AORTIC, [name], cleaved nuclei and scant cytoplasm.  By immunohistochemistry, [name], [name], [name], and BCL6 but  negative for CD5.  CD21 (CD23) highlights an expanded follicular dendritic meshwork. CD3 highlights background T cells.  The proliferative rate by Ki-67 is low (less than 10%).  Flow cytometry was attempted; however, there was insufficient material for analysis (see [id number]).  Overall, the features are consistent with relapse of the patient's previously diagnosed follicular lymphoma.  Based on the biopsy, this is favored to be a low-grade follicular lymphoma   02/19/2019 -  Chemotherapy   The patient had idelisib for chemotherapy treatment.     05/17/2019 Imaging   1. Interval generalized decrease in abdominopelvic lymphadenopathy identified on the previous study. No new or progressive lymphadenopathy on today's study. 2. Moderate hiatal hernia. 3.  Emphysema (ICD10-J43.9) and Aortic Atherosclerosis (ICD10-170.0)   11/16/2019 Imaging   IMPRESSION: Chest Impression:   1. No mediastinal lymphadenopathy. 2. Post RIGHT lung wedge resection without evidence local recurrence.   Abdomen / Pelvis Impression:   1.  Stable numerous small periaortic retroperitoneal nodes and common iliac nodes. No progression of adenopathy. 2. No splenomegaly.  No skeletal metastasis.     07/31/2020 Imaging   1. Stable examination. No significant interval change in the prominent/mildly enlarged lymph nodes below the diaphragm. No thoracic adenopathy. No splenomegaly. 2. Postsurgical change of right lung wedge resection without evidence of local recurrence. 3. Large hiatal hernia. 4. Colonic diverticulosis without findings of acute diverticulitis. 5. Aortic Atherosclerosis (ICD10-I70.0) and Emphysema (ICD10-J43.9).     01/29/2021 Imaging   1. Overall mild interval enlargement of lymph nodes in the abdomen and pelvis as described. 2. No lymphadenopathy identified in the chest. 3. Emphysematous changes of the lungs. 4. Large hiatal hernia.  Extensive colonic diverticulosis. 5. Other ancillary findings as  described.     04/30/2021 Imaging   Chest Impression:   1. No mediastinal lymphadenopathy. 2. Large hiatal hernia. 3. Retrocrural node slightly decreased in size.   Abdomen / Pelvis Impression:   1. Cluster of numerous small retroperitoneal periaortic lymph nodes. Lymph nodes are decreased slightly in size compared to recent CT scan. 2. Likewise reduction in LEFT external iliac small lymph node. 3. No evidence of progression of lymphoma. 4. Normal spleen. 5. No bone involvement.   10/28/2021 Imaging   1. Overall trend towards progressive disease on today's exam. Lymph nodes in the hepatoduodenal ligament are upper normal for size but stable in the interval. Index retroperitoneal lymph nodes are progressive, notably in the right common iliac chain. Soft tissue nodule/lymph node in the retroperitoneal fat adjacent to the right iliacus muscle is progressive. 2. Large hiatal hernia. 3. Colonic diverticulosis without diverticulitis. 4. Prostatomegaly. 5. Aortic Atherosclerosis (ICD10-I70.0) and Emphysema (ICD10-J43.9).   03/10/2022 PET scan   Multiple intensely avid and enlarged retroperitoneal lymph nodes consistent with patient's known history of lymphoma. Of note, some of these lymph nodes demonstrate interval growth when compared to most recent abdominal CT, for reference to the node adjacent to the right psoas musculature. For purposes of assessing transformation into a more aggressive form of lymphoma, SUV max calculated using total body weight is 10.5 for the largest right iliac node and 9.8 for the soft tissue implant posterior to the right psoas muscle.  - Similar focus of increased hypermetabolic to be within the right hilar region, which may represent mediastinal involvement versus reactive etiology. Attention on follow-up examinations.    03/24/2022 Procedure   PROCEDURE: The patient was placed in the prone position. Initial CT images of the pelvis were obtained, demonstrating  enlarged pelvic lymph node.   An appropriate entry site was identified and marked on the skin. The right back was prepped and draped using all elements of maximal sterile barrier technique.  Local anesthesia was achieved with lidocaine.   Under intermittent CT guidance, a 18-G coaxial needle was inserted into right pelvic lymph node.  A total of 3 cores were obtained through the cannula using spring-loaded 18-G Corvocet biopsy device.    03/24/2022 Pathology Results   A: Lymph node, pelvis, right, biopsy -  Involved by follicular lymphoma (see Comment)   Special Stains: CD5, [name] and CD20 are performed by immunohistochemistry and/or in situ hybridization in addition to flow cytometry for histopathologic and immunophenotypic correlation.   Appropriate controls for each stain have been evaluated and stain as expected.   Sections of block A1 are stained.   CD3: CD3 stain is negative in neoplastic lymphocytes. CD20: CD20 stains neoplastic lymphocytes. CD21: CD21 stains follicular dendritic cells of enlarge neoplastic  follicles. CD5: CD5 mirrors CD3 [name]: [name] stains weakly neoplastic lymphocytes. Bcl-6: Bcl-6 stains neoplastic lymphocytes. Bcl-2: Bcl-2 stains neoplastic lymphocytes.  Cyclin-D1: Cyclin-D1 stains is negative in neoplastic lymphocytes. Ki-67: Ki-67 shows low proliferation rate in neoplastic lymphocytes (10%). GMS: GMS stain is negative for microorganisms AFB: AFB stain is negative for microorganisms   04/08/2022 -  Chemotherapy   Revlimid was started on 4/4 Patient is on Treatment Plan : LYMPHOMA Lenalidomide + Rituximab q28d      07/01/2022 Imaging   CT CHEST ABDOMEN PELVIS W CONTRAST  Result Date: 07/01/2022 CLINICAL DATA:  Hematologic malignancy, assess treatment response follicular B-cell lymphoma, ongoing chemotherapy * Tracking Code: BO * EXAM: CT CHEST, ABDOMEN, AND PELVIS WITH CONTRAST TECHNIQUE: Multidetector CT imaging of the chest, abdomen and pelvis was  performed following the standard protocol during bolus administration of intravenous contrast. RADIATION DOSE REDUCTION: This exam was performed according to the departmental dose-optimization program which includes automated exposure control, adjustment of the mA and/or kV according to patient size and/or use of iterative reconstruction technique. CONTRAST:  OMNIPAQUE IOHEXOL 300 MG/ML  SOLN COMPARISON:  PET-CT, 03/10/2022 FINDINGS: CT CHEST FINDINGS Cardiovascular: Aortic atherosclerosis. Normal heart size. Three-vessel coronary artery calcifications. No pericardial effusion. Mediastinum/Nodes: No enlarged mediastinal, hilar, or axillary lymph nodes. Moderate hiatal hernia with intrathoracic position of the gastric fundus. Unchanged prominent lymph nodes at the right aspect of the hiatal hernia, not previously FDG avid. Thyroid gland, trachea, and esophagus demonstrate no significant findings. Lungs/Pleura: Moderate centrilobular and paraseptal emphysema. Unchanged postoperative findings of right lower lobe wedge resection. No pleural effusion or pneumothorax. Musculoskeletal: No chest wall abnormality. No acute osseous findings. CT ABDOMEN PELVIS FINDINGS Hepatobiliary: No focal liver abnormality is seen. Status post cholecystectomy. Unchanged postoperative biliary ductal dilatation. Pancreas: Unremarkable. No pancreatic ductal dilatation or surrounding inflammatory changes. Spleen: Normal in size without significant abnormality. Adrenals/Urinary Tract: Adrenal glands are unremarkable. Simple, benign left renal cortical cysts, for which no further follow-up or characterization is required. Kidneys are otherwise normal, without renal calculi, solid lesion, or hydronephrosis. Bladder is unremarkable. Stomach/Bowel: Stomach is within normal limits. Appendix not clearly visualized. No evidence of bowel wall thickening, distention, or inflammatory changes. Descending and sigmoid diverticulosis  Vascular/Lymphatic: Aortic atherosclerosis. Interval decrease in size of previously FDG avid retroperitoneal lymph nodes, largest right common iliac node measuring 0.9 x 0.8 cm (series 2, image 73). Unchanged prominent retroperitoneal lymph nodes, not previously FDG avid (series 2, image 63). Reproductive: Prostatomegaly with median lobe hypertrophy. Other: Small fat containing right inguinal hernia. No ascites. Nearly complete resolution of a retroperitoneal soft tissue nodule overlying the right iliac crest measuring 0.7 x 0.5 cm (series 2, image 87). Musculoskeletal: No acute osseous findings. IMPRESSION: 1. Interval decrease in size of previously FDG avid retroperitoneal lymph nodes, as well as a soft tissue nodule overlying the right iliac crest. 2. Additional prominent lymph nodes adjacent to the hiatal hernia and in the retroperitoneum are unchanged, not previously FDG avid. 3. Findings are consistent with treatment response of lymphoma. 4. Emphysema. 5. Coronary artery disease. 6. Prostatomegaly. Aortic Atherosclerosis (ICD10-I70.0) and Emphysema (ICD10-J43.9). Electronically Signed   By: Jearld Lesch M.D.   On: 07/01/2022 08:39        PHYSICAL EXAMINATION: ECOG PERFORMANCE STATUS: 1 - Symptomatic but completely ambulatory  Vitals:   07/29/22 0838  BP: 133/68  Pulse: 74  Resp: 18  Temp: 98.2 F (36.8 C)  SpO2: 97%   Filed Weights   07/29/22 0838  Weight: 185 lb 12.8  oz (84.3 kg)    GENERAL:alert, no distress and comfortable NEURO: alert & oriented x 3 with fluent speech, no focal motor/sensory deficits  LABORATORY DATA:  I have reviewed the data as listed    Component Value Date/Time   NA 139 07/29/2022 0825   NA 139 01/28/2022 0822   NA 139 04/14/2016 0943   K 3.9 07/29/2022 0825   K 4.6 04/14/2016 0943   CL 105 07/29/2022 0825   CL 104 03/24/2012 1024   CO2 24 07/29/2022 0825   CO2 25 04/14/2016 0943   GLUCOSE 192 (H) 07/29/2022 0825   GLUCOSE 111 04/14/2016 0943    GLUCOSE 80 03/24/2012 1024   BUN 15 07/29/2022 0825   BUN 12 01/28/2022 0822   BUN 8.6 04/14/2016 0943   CREATININE 1.07 07/29/2022 0825   CREATININE 1.1 04/14/2016 0943   CALCIUM 9.0 07/29/2022 0825   CALCIUM 9.4 04/14/2016 0943   PROT 6.4 (L) 07/29/2022 0825   PROT 6.4 10/05/2021 1016   PROT 6.5 04/14/2016 0943   ALBUMIN 4.0 07/29/2022 0825   ALBUMIN 4.4 10/05/2021 1016   ALBUMIN 3.8 04/14/2016 0943   AST 15 07/29/2022 0825   AST 28 04/14/2016 0943   ALT 19 07/29/2022 0825   ALT 41 04/14/2016 0943   ALKPHOS 67 07/29/2022 0825   ALKPHOS 71 04/14/2016 0943   BILITOT 0.7 07/29/2022 0825   BILITOT 0.61 04/14/2016 0943   GFRNONAA >60 07/29/2022 0825   GFRAA 69 09/27/2019 0801    No results found for: "SPEP", "UPEP"  Lab Results  Component Value Date   WBC 5.9 07/29/2022   NEUTROABS 2.8 07/29/2022   HGB 15.5 07/29/2022   HCT 46.2 07/29/2022   MCV 86.5 07/29/2022   PLT 160 07/29/2022      Chemistry      Component Value Date/Time   NA 139 07/29/2022 0825   NA 139 01/28/2022 0822   NA 139 04/14/2016 0943   K 3.9 07/29/2022 0825   K 4.6 04/14/2016 0943   CL 105 07/29/2022 0825   CL 104 03/24/2012 1024   CO2 24 07/29/2022 0825   CO2 25 04/14/2016 0943   BUN 15 07/29/2022 0825   BUN 12 01/28/2022 0822   BUN 8.6 04/14/2016 0943   CREATININE 1.07 07/29/2022 0825   CREATININE 1.1 04/14/2016 0943      Component Value Date/Time   CALCIUM 9.0 07/29/2022 0825   CALCIUM 9.4 04/14/2016 0943   ALKPHOS 67 07/29/2022 0825   ALKPHOS 71 04/14/2016 0943   AST 15 07/29/2022 0825   AST 28 04/14/2016 0943   ALT 19 07/29/2022 0825   ALT 41 04/14/2016 0943   BILITOT 0.7 07/29/2022 0825   BILITOT 0.61 04/14/2016 1610

## 2022-07-29 NOTE — Assessment & Plan Note (Signed)
He was seen by urologist with plan for transurethral resection of the prostate on September 09, 2022 From hematology perspective, there is no contraindication for him to proceed I recommend the patient to stop Revlimid 7 days prior to the procedure, meaning on September 02, 2022 and to resume 48 hours after his procedure I will put this recommendation in writing I will reach out to his urologist regarding this The patient would still need separate cardiology clearance given his history of atrial fibrillation and ongoing need for anticoagulation therapy

## 2022-08-02 ENCOUNTER — Ambulatory Visit: Payer: Medicare Other | Attending: Cardiovascular Disease

## 2022-08-02 ENCOUNTER — Telehealth: Payer: Self-pay | Admitting: *Deleted

## 2022-08-02 DIAGNOSIS — I480 Paroxysmal atrial fibrillation: Secondary | ICD-10-CM

## 2022-08-02 NOTE — Telephone Encounter (Signed)
Dr. Ladona Mow nurse Verdon Cummins, RN came to with a fax from Dr. Dimas Millin office. In review of this clearance request I received today, I reviewed the chart and found that the exact clearance has already been addressed and the pt has been cleared. Please see clearance notes from Edd Fabian, FNP on 07/28/22. I assured the RN that I will re-fax notes to surgeon's office.

## 2022-08-02 NOTE — Progress Notes (Unsigned)
Enrolled for [employer] to mail a ZIO XT long term holter monitor to the patients address on file.  

## 2022-08-02 NOTE — Telephone Encounter (Signed)
Spoke with patient, agreed to Dr Morrie Sheldon suggestion to wearing cardiac monitor. Explained over the phone the process (will come in the mail with instructions, wear for 5 days, and return in the same box). Confirmed to 7 day wear per Dr Nelly Laurence, cannot order for only 5 days. No further needs at this time.    Mealor, Roberts Gaudy, MD  Alois Cliche, LPN; Festus Holts, RN Let's place a 5 day ePatch.  The apple watch can recognize when a rhythm is irregular and give you a notification that a rhythm may be AF, but [employer] knows that their technology is not definitive and cannot make diagnoses.  I don't recall him having these symptoms -- especially to this degree -- in the past when he was certainly having AF. It's possible something else is making him feel sick, and the physiologic stress he's under can very easily cause him to have PACs or AF. We'll get a better look with the monitor.

## 2022-08-04 ENCOUNTER — Encounter: Payer: Self-pay | Admitting: Cardiovascular Disease

## 2022-08-04 DIAGNOSIS — I480 Paroxysmal atrial fibrillation: Secondary | ICD-10-CM

## 2022-08-16 DIAGNOSIS — I480 Paroxysmal atrial fibrillation: Secondary | ICD-10-CM | POA: Diagnosis not present

## 2022-08-22 ENCOUNTER — Encounter: Payer: Self-pay | Admitting: Hematology and Oncology

## 2022-08-23 ENCOUNTER — Other Ambulatory Visit: Payer: Self-pay | Admitting: Hematology and Oncology

## 2022-08-23 NOTE — Telephone Encounter (Signed)
Pls refill electronically °

## 2022-08-24 ENCOUNTER — Encounter: Payer: Self-pay | Admitting: Cardiovascular Disease

## 2022-08-26 ENCOUNTER — Inpatient Hospital Stay (HOSPITAL_BASED_OUTPATIENT_CLINIC_OR_DEPARTMENT_OTHER): Payer: Medicare Other | Admitting: Hematology and Oncology

## 2022-08-26 ENCOUNTER — Encounter: Payer: Self-pay | Admitting: Hematology and Oncology

## 2022-08-26 ENCOUNTER — Other Ambulatory Visit: Payer: Self-pay

## 2022-08-26 ENCOUNTER — Inpatient Hospital Stay: Payer: Medicare Other

## 2022-08-26 ENCOUNTER — Inpatient Hospital Stay: Payer: Medicare Other | Attending: Hematology and Oncology

## 2022-08-26 VITALS — BP 100/64 | HR 53 | Temp 97.8°F | Resp 18

## 2022-08-26 VITALS — BP 131/60 | HR 52 | Temp 98.0°F | Resp 18 | Ht 72.0 in | Wt 189.4 lb

## 2022-08-26 DIAGNOSIS — N401 Enlarged prostate with lower urinary tract symptoms: Secondary | ICD-10-CM | POA: Diagnosis not present

## 2022-08-26 DIAGNOSIS — R350 Frequency of micturition: Secondary | ICD-10-CM | POA: Diagnosis not present

## 2022-08-26 DIAGNOSIS — Z7901 Long term (current) use of anticoagulants: Secondary | ICD-10-CM | POA: Insufficient documentation

## 2022-08-26 DIAGNOSIS — C8280 Other types of follicular lymphoma, unspecified site: Secondary | ICD-10-CM | POA: Diagnosis not present

## 2022-08-26 DIAGNOSIS — Z5112 Encounter for antineoplastic immunotherapy: Secondary | ICD-10-CM | POA: Diagnosis not present

## 2022-08-26 DIAGNOSIS — C8288 Other types of follicular lymphoma, lymph nodes of multiple sites: Secondary | ICD-10-CM | POA: Insufficient documentation

## 2022-08-26 DIAGNOSIS — N400 Enlarged prostate without lower urinary tract symptoms: Secondary | ICD-10-CM | POA: Insufficient documentation

## 2022-08-26 DIAGNOSIS — I4891 Unspecified atrial fibrillation: Secondary | ICD-10-CM | POA: Diagnosis not present

## 2022-08-26 LAB — CBC WITH DIFFERENTIAL (CANCER CENTER ONLY)
Abs Immature Granulocytes: 0.02 10*3/uL (ref 0.00–0.07)
Basophils Absolute: 0.1 10*3/uL (ref 0.0–0.1)
Basophils Relative: 2 %
Eosinophils Absolute: 0.8 10*3/uL — ABNORMAL HIGH (ref 0.0–0.5)
Eosinophils Relative: 13 %
HCT: 43.5 % (ref 39.0–52.0)
Hemoglobin: 14.7 g/dL (ref 13.0–17.0)
Immature Granulocytes: 0 %
Lymphocytes Relative: 20 %
Lymphs Abs: 1.2 10*3/uL (ref 0.7–4.0)
MCH: 28.8 pg (ref 26.0–34.0)
MCHC: 33.8 g/dL (ref 30.0–36.0)
MCV: 85.3 fL (ref 80.0–100.0)
Monocytes Absolute: 0.9 10*3/uL (ref 0.1–1.0)
Monocytes Relative: 15 %
Neutro Abs: 3.1 10*3/uL (ref 1.7–7.7)
Neutrophils Relative %: 50 %
Platelet Count: 145 10*3/uL — ABNORMAL LOW (ref 150–400)
RBC: 5.1 MIL/uL (ref 4.22–5.81)
RDW: 14.4 % (ref 11.5–15.5)
WBC Count: 6.1 10*3/uL (ref 4.0–10.5)
nRBC: 0 % (ref 0.0–0.2)

## 2022-08-26 LAB — CMP (CANCER CENTER ONLY)
ALT: 16 U/L (ref 0–44)
AST: 17 U/L (ref 15–41)
Albumin: 3.9 g/dL (ref 3.5–5.0)
Alkaline Phosphatase: 62 U/L (ref 38–126)
Anion gap: 10 (ref 5–15)
BUN: 15 mg/dL (ref 8–23)
CO2: 22 mmol/L (ref 22–32)
Calcium: 8.2 mg/dL — ABNORMAL LOW (ref 8.9–10.3)
Chloride: 105 mmol/L (ref 98–111)
Creatinine: 1 mg/dL (ref 0.61–1.24)
GFR, Estimated: >60 mL/min (ref >60)
Glucose, Bld: 220 mg/dL — ABNORMAL HIGH (ref 70–99)
Potassium: 4.3 mmol/L (ref 3.5–5.1)
Sodium: 137 mmol/L (ref 135–145)
Total Bilirubin: 0.5 mg/dL (ref 0.3–1.2)
Total Protein: 6 g/dL — ABNORMAL LOW (ref 6.5–8.1)

## 2022-08-26 MED ORDER — ACETAMINOPHEN 325 MG PO TABS
650.0000 mg | ORAL_TABLET | Freq: Once | ORAL | Status: AC
  Administered 2022-08-26: 650 mg via ORAL
  Filled 2022-08-26: qty 2

## 2022-08-26 MED ORDER — FAMOTIDINE IN NACL 20-0.9 MG/50ML-% IV SOLN
20.0000 mg | Freq: Once | INTRAVENOUS | Status: AC
  Administered 2022-08-26: 20 mg via INTRAVENOUS
  Filled 2022-08-26: qty 50

## 2022-08-26 MED ORDER — SODIUM CHLORIDE 0.9 % IV SOLN
375.0000 mg/m2 | Freq: Once | INTRAVENOUS | Status: AC
  Administered 2022-08-26: 800 mg via INTRAVENOUS
  Filled 2022-08-26: qty 50

## 2022-08-26 MED ORDER — DIPHENHYDRAMINE HCL 25 MG PO CAPS
25.0000 mg | ORAL_CAPSULE | Freq: Once | ORAL | Status: AC
  Administered 2022-08-26: 25 mg via ORAL
  Filled 2022-08-26: qty 1

## 2022-08-26 MED ORDER — SODIUM CHLORIDE 0.9 % IV SOLN: Freq: Once | INTRAVENOUS | Status: AC

## 2022-08-26 MED ORDER — MONTELUKAST SODIUM 10 MG PO TABS
10.0000 mg | ORAL_TABLET | Freq: Once | ORAL | Status: AC
  Administered 2022-08-26: 10 mg via ORAL
  Filled 2022-08-26: qty 1

## 2022-08-26 NOTE — Assessment & Plan Note (Signed)
He was seen by urologist with plan for transurethral resection of the prostate on September 09, 2022 From hematology perspective, there is no contraindication for him to proceed I recommend the patient to stop Revlimid 7 days prior to the procedure, meaning on September 02, 2022 and to resume on September 3rd The patient would still need separate cardiology clearance given his history of atrial fibrillation and ongoing need for anticoagulation therapy In general, he will hold Eliquis for 48 hours prior to surgery and to resume approximately 48 hours after surgery

## 2022-08-26 NOTE — Progress Notes (Signed)
[facility] OFFICE PROGRESS NOTE  Patient Care Team: Dettinger, Elige Radon, MD as PCP - General (Family Medicine) Branch, Dorothe Pea, MD as PCP - Cardiology (Cardiology) Mealor, Roberts Gaudy, MD as PCP - Electrophysiology (Cardiology) Iva Boop, MD as Consulting Physician (Gastroenterology) Artis Delay, MD as Consulting Physician (Hematology and Oncology) Cherlyn Roberts, MD as Consulting Physician (Dermatology)  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HCC) Overall, he tolerated treatment fairly well without major side effects His recent CT imaging showed excellent response to therapy He will complete cycle 6 of rituximab today.  He will continue lenalidomide.  After today, I will switch his rituximab to every other month.  He will continue lenalidomide until approximately a week before his surgery and he will stop.  He will resume lenalidomide on September 3 I will see him back in October for further follow-up  Benign prostate hyperplasia He was seen by urologist with plan for transurethral resection of the prostate on September 09, 2022 From hematology perspective, there is no contraindication for him to proceed I recommend the patient to stop Revlimid 7 days prior to the procedure, meaning on September 02, 2022 and to resume on September 3rd The patient would still need separate cardiology clearance given his history of atrial fibrillation and ongoing need for anticoagulation therapy In general, he will hold Eliquis for 48 hours prior to surgery and to resume approximately 48 hours after surgery  No orders of the defined types were placed in this encounter.   All questions were answered. The patient knows to call the clinic with any problems, questions or concerns. The total time spent in the appointment was 30 minutes encounter with patients including review of chart and various tests results, discussions about plan of care and coordination of care  plan   Artis Delay, MD 08/26/2022 10:19 AM  INTERVAL HISTORY: Please see below for problem oriented charting. he returns for treatment follow-up He is receiving lenalidomide and rituximab now He has surgery scheduled for 2 weeks He denies side effects from treatment We discussed timing of holding his lenalidomide and timing to resume lenalidomide after surgery as well as Eliquis  REVIEW OF SYSTEMS:   Constitutional: Denies fevers, chills or abnormal weight loss Eyes: Denies blurriness of vision Ears, nose, mouth, throat, and face: Denies mucositis or sore throat Respiratory: Denies cough, dyspnea or wheezes Cardiovascular: Denies palpitation, chest discomfort or lower extremity swelling Gastrointestinal:  Denies nausea, heartburn or change in bowel habits Skin: Denies abnormal skin rashes Lymphatics: Denies new lymphadenopathy or easy bruising Neurological:Denies numbness, tingling or new weaknesses Behavioral/Psych: Mood is stable, no new changes  All other systems were reviewed with the patient and are negative.  I have reviewed the past medical history, past surgical history, social history and family history with the patient and they are unchanged from previous note.  ALLERGIES:  is allergic to rituximab.  MEDICATIONS:  Current Outpatient Medications  Medication Sig Dispense Refill   acyclovir (ZOVIRAX) 400 MG tablet Take 1 tablet (400 mg total) by mouth daily. 90 tablet 1   apixaban (ELIQUIS) 5 MG TABS tablet TAKE 1 TABLET TWICE A DAY 180 tablet 2   Ascorbic Acid (VITAMIN C) 1000 MG tablet Take 1,000 mg by mouth daily.     brimonidine (ALPHAGAN) 0.2 % ophthalmic solution Place 1 drop into both eyes 2 (two) times daily.      Calcium Carbonate-Vit D-Min (CALCIUM 1200 PO) Take 1,200 mg by mouth daily.  cyclobenzaprine (FLEXERIL) 5 MG tablet Take 1 tablet (5 mg total) by mouth 3 (three) times daily as needed for muscle spasms. 30 tablet 0   finasteride (PROSCAR) 5 MG tablet  Take 1 tablet (5 mg total) by mouth daily. 90 tablet 3   lenalidomide (REVLIMID) 15 MG capsule TAKE 1 CAPSULE DAILY FOR 21 DAYS ON, THEN 7 DAYS OFF, REPEAT EVERY 28 DAYS 21 capsule 0   Lifitegrast (XIIDRA) 5 % SOLN Apply 1 drop to eye 2 (two) times daily.     LUMIGAN 0.01 % SOLN Place 1 drop into both eyes at bedtime.     Magnesium 250 MG TABS Take 1 tablet by mouth daily.     meclizine (ANTIVERT) 25 MG tablet Take 1 tablet (25 mg total) by mouth 3 (three) times daily as needed for dizziness. 30 tablet 0   metoprolol succinate (TOPROL-XL) 25 MG 24 hr tablet Take 0.5 tablets (12.5 mg total) by mouth daily. 90 tablet 3   mometasone (ELOCON) 0.1 % cream Apply 1 Application topically 2 (two) times daily as needed (eczema).     Multiple Vitamin (MULTIVITAMIN WITH MINERALS) TABS tablet Take 1 tablet by mouth daily.      omeprazole (PRILOSEC OTC) 20 MG tablet Take 20 mg by mouth every other day.     ondansetron (ZOFRAN) 8 MG tablet Take 1 tablet (8 mg total) by mouth every 8 (eight) hours as needed for nausea or vomiting. 30 tablet 1   Polyethyl Glycol-Propyl Glycol (SYSTANE OP) Place 1 drop into both eyes daily as needed (dry eyes).     Probiotic Product (SUPERIOR PROBIOTIC) CAPS Take 1 capsule by mouth daily.     prochlorperazine (COMPAZINE) 10 MG tablet Take 1 tablet (10 mg total) by mouth every 6 (six) hours as needed for nausea or vomiting. 30 tablet 1   rosuvastatin (CRESTOR) 20 MG tablet Take 1 tablet (20 mg total) by mouth daily. 90 tablet 3   silodosin (RAPAFLO) 8 MG CAPS capsule Take 1 capsule (8 mg total) by mouth daily with breakfast. 90 capsule 3   No current facility-administered medications for this visit.   Facility-Administered Medications Ordered in Other Visits  Medication Dose Route Frequency Provider Last Rate Last Admin   riTUXimab-pvvr (RUXIENCE) 800 mg in sodium chloride 0.9 % 250 mL (2.4242 mg/mL) infusion  375 mg/m2 (Treatment Plan Recorded) Intravenous Once Artis Delay, MD         SUMMARY OF ONCOLOGIC HISTORY: Oncology History Overview Note  Low grade B-cell lymphoma   Primary site: Lymphoid Neoplasms   Staging method: AJCC 6th Edition   Clinical: Stage IV signed by Artis Delay, MD on 09/26/2013  9:15 AM   Summary: Stage IV      Follicular low grade B-cell lymphoma (HCC)  12/10/2003 Surgery   Inguinal lymph node biopsy showed follicular lymphoma.   12/12/2003 - 06/01/2004 Chemotherapy   He was treated with R. CHOP chemotherapy which show complete remission. The number of cycles of R. CHOP chemotherapy was unknown.   12/19/2006 Surgery   Lung resection show follicular lymphoma.   01/02/2007 - 09/01/2008 Chemotherapy   The patient was treated with single agent rituximab alone.   01/19/2007 Bone Marrow Biopsy   Bone marrow biopsy was negative.   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   05/08/2013 - 05/11/2013 Hospital Admission   The patient was admitted to the hospital for management of pericarditis. CT scan showed extensive lymphadenopathy.   06/07/2013 Imaging  PET/CT scan showed extensive lymphadenopathy   06/25/2013 Procedure   He has placement of Infuse-a-Port.   06/28/2013 Bone Marrow Biopsy   Bone marrow biopsy is positive for lymphoma involvement.   07/04/2013 - 11/22/2013 Chemotherapy   He is treated with 6 cycles of bendamustine with rituximab.   09/24/2013 Imaging   Repeat PET scan show complete remission.   12/26/2013 Imaging   PEt scan showed complete remission   09/26/2014 Imaging   CT scan of the chest abdomen and pelvis show no evidence of disease   03/26/2015 Imaging   CT scan showed no evidence of lymphoma   04/14/2016 Imaging   CT: Borderline prominent right hilar and subcarinal lymph nodes, but not appreciably changed. 2. Low-grade but increased central mesenteric stranding with some small mesenteric lymph nodes. This could certainly be inflammatory, and there is no bulky adenopathy to  suggest a malignant etiology.  3. Coronary and aortoiliac atherosclerotic calcification. 4. Centrilobular and paraseptal emphysema. Postoperative findings in the right lung. 5. 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, much of which is likely a physiologic response to cholecystectomy. 7. Sigmoid colon diverticulosis. 8.  Prominent stool throughout the colon favors constipation. 9. Enlarged prostate gland, volume estimated at 75 cubic cm.   02/15/2017 Imaging   No evidence of recurrent lymphoma or other acute findings.  Stable moderate hiatal hernia.  Colonic diverticulosis, without radiographic evidence of diverticulitis.  Stable mildly enlarged prostate.  Mild emphysema.  Aortic and coronary artery atherosclerosis.   01/31/2019 Imaging   1. Interval development of abdominal and pelvic adenopathy compatible with recurrent lymphoma. 2. Emphysema and aortic atherosclerosis. 3. Multi vessel coronary artery calcifications. 4. Moderate to large hiatal hernia   02/09/2019 Procedure   Successful CT-guided core biopsy of the left retroperitoneal periaortic adenopathy   02/09/2019 Pathology Results   SURGICAL PATHOLOGY  CASE: [id number]  PATIENT: [name]  Surgical Pathology Report   Clinical History: Lymphoma; [name], LEFT PARA AORTIC, [name], cleaved nuclei and scant cytoplasm.  By immunohistochemistry, [name], [name], [name], and BCL6 but negative for CD5.  CD21 (CD23) highlights an expanded follicular  dendritic meshwork. CD3 highlights background T cells.  The proliferative rate by Ki-67 is low (less than 10%).  Flow cytometry was attempted; however, there was insufficient material for analysis (see [id number]).  Overall, the features are consistent with relapse of the patient's previously diagnosed follicular lymphoma.  Based on the biopsy, this is favored to be a low-grade follicular lymphoma   02/19/2019 -  Chemotherapy   The patient had idelisib for chemotherapy treatment.     05/17/2019 Imaging   1. Interval generalized decrease in abdominopelvic lymphadenopathy identified on the previous study. No new or progressive lymphadenopathy on today's study. 2. Moderate hiatal hernia. 3.  Emphysema (ICD10-J43.9) and Aortic Atherosclerosis (ICD10-170.0)   11/16/2019 Imaging   IMPRESSION: Chest Impression:   1. No mediastinal lymphadenopathy. 2. Post RIGHT lung wedge resection without evidence local recurrence.   Abdomen / Pelvis Impression:   1. Stable numerous small periaortic retroperitoneal nodes and  common iliac nodes. No progression of adenopathy. 2. No splenomegaly.  No skeletal metastasis.     07/31/2020 Imaging   1. Stable examination. No significant interval change in the prominent/mildly enlarged lymph nodes below the diaphragm. No thoracic adenopathy. No splenomegaly. 2. Postsurgical change of right lung wedge resection without evidence of local recurrence. 3. Large hiatal hernia. 4. Colonic diverticulosis without findings of acute diverticulitis. 5. Aortic Atherosclerosis (ICD10-I70.0) and Emphysema (ICD10-J43.9).     01/29/2021 Imaging   1. Overall mild interval enlargement of lymph nodes in the abdomen and pelvis as described. 2. No lymphadenopathy identified in the chest. 3. Emphysematous changes of the lungs. 4. Large hiatal hernia.  Extensive colonic diverticulosis. 5. Other ancillary findings as described.     04/30/2021 Imaging   Chest Impression:   1. No  mediastinal lymphadenopathy. 2. Large hiatal hernia. 3. Retrocrural node slightly decreased in size.   Abdomen / Pelvis Impression:   1. Cluster of numerous small retroperitoneal periaortic lymph nodes. Lymph nodes are decreased slightly in size compared to recent CT scan. 2. Likewise reduction in LEFT external iliac small lymph node. 3. No evidence of progression of lymphoma. 4. Normal spleen. 5. No bone involvement.   10/28/2021 Imaging   1. Overall trend towards progressive disease on today's exam. Lymph nodes in the hepatoduodenal ligament are upper normal for size but stable in the interval. Index retroperitoneal lymph nodes are progressive, notably in the right common iliac chain. Soft tissue nodule/lymph node in the retroperitoneal fat adjacent to the right iliacus muscle is progressive. 2. Large hiatal hernia. 3. Colonic diverticulosis without diverticulitis. 4. Prostatomegaly. 5. Aortic Atherosclerosis (ICD10-I70.0) and Emphysema (ICD10-J43.9).   03/10/2022 PET scan   Multiple intensely avid and enlarged retroperitoneal lymph nodes consistent with patient's known history of lymphoma. Of note, some of these lymph nodes demonstrate interval growth when compared to most recent abdominal CT, for reference to the node adjacent to the right psoas musculature. For purposes of assessing transformation into a more aggressive form of lymphoma, SUV max calculated using total body weight is 10.5 for the largest right iliac node and 9.8 for the soft tissue implant posterior to the right psoas muscle.  - Similar focus of increased hypermetabolic to be within the right hilar region, which may represent mediastinal involvement versus reactive etiology. Attention on follow-up examinations.    03/24/2022 Procedure   PROCEDURE: The patient was placed in the prone position. Initial CT images of the pelvis were obtained, demonstrating enlarged pelvic lymph node.   An appropriate entry site was identified  and marked on the skin. The right back was prepped and draped using all elements of maximal sterile barrier technique.  Local anesthesia was achieved with lidocaine.   Under intermittent CT guidance, a 18-G coaxial needle was inserted into right pelvic lymph node.  A total of 3 cores were obtained through the cannula using spring-loaded 18-G Corvocet biopsy device.    03/24/2022 Pathology Results   A: Lymph node, pelvis, right, biopsy -  Involved by follicular lymphoma (see Comment)   Special Stains: CD5, [name] and CD20 are performed by immunohistochemistry and/or in situ hybridization in addition to flow cytometry for histopathologic and immunophenotypic correlation.   Appropriate controls for each stain have been evaluated and stain as expected.   Sections of block A1 are stained.   CD3: CD3 stain is negative in neoplastic lymphocytes. CD20: CD20 stains neoplastic lymphocytes. CD21: CD21 stains follicular dendritic cells of enlarge neoplastic follicles. CD5: CD5 mirrors CD3 [name]: [name]  stains weakly neoplastic lymphocytes. Bcl-6: Bcl-6 stains neoplastic lymphocytes. Bcl-2: Bcl-2 stains neoplastic lymphocytes.  Cyclin-D1: Cyclin-D1 stains is negative in neoplastic lymphocytes. Ki-67: Ki-67 shows low proliferation rate in neoplastic lymphocytes (10%). GMS: GMS stain is negative for microorganisms AFB: AFB stain is negative for microorganisms   04/08/2022 -  Chemotherapy   Revlimid was started on 4/4 Patient is on Treatment Plan : LYMPHOMA Lenalidomide + Rituximab q28d      07/01/2022 Imaging   CT CHEST ABDOMEN PELVIS W CONTRAST  Result Date: 07/01/2022 CLINICAL DATA:  Hematologic malignancy, assess treatment response follicular B-cell lymphoma, ongoing chemotherapy * Tracking Code: BO * EXAM: CT CHEST, ABDOMEN, AND PELVIS WITH CONTRAST TECHNIQUE: Multidetector CT imaging of the chest, abdomen and pelvis was performed following the standard protocol during bolus administration of  intravenous contrast. RADIATION DOSE REDUCTION: This exam was performed according to the departmental dose-optimization program which includes automated exposure control, adjustment of the mA and/or kV according to patient size and/or use of iterative reconstruction technique. CONTRAST:  OMNIPAQUE IOHEXOL 300 MG/ML  SOLN COMPARISON:  PET-CT, 03/10/2022 FINDINGS: CT CHEST FINDINGS Cardiovascular: Aortic atherosclerosis. Normal heart size. Three-vessel coronary artery calcifications. No pericardial effusion. Mediastinum/Nodes: No enlarged mediastinal, hilar, or axillary lymph nodes. Moderate hiatal hernia with intrathoracic position of the gastric fundus. Unchanged prominent lymph nodes at the right aspect of the hiatal hernia, not previously FDG avid. Thyroid gland, trachea, and esophagus demonstrate no significant findings. Lungs/Pleura: Moderate centrilobular and paraseptal emphysema. Unchanged postoperative findings of right lower lobe wedge resection. No pleural effusion or pneumothorax. Musculoskeletal: No chest wall abnormality. No acute osseous findings. CT ABDOMEN PELVIS FINDINGS Hepatobiliary: No focal liver abnormality is seen. Status post cholecystectomy. Unchanged postoperative biliary ductal dilatation. Pancreas: Unremarkable. No pancreatic ductal dilatation or surrounding inflammatory changes. Spleen: Normal in size without significant abnormality. Adrenals/Urinary Tract: Adrenal glands are unremarkable. Simple, benign left renal cortical cysts, for which no further follow-up or characterization is required. Kidneys are otherwise normal, without renal calculi, solid lesion, or hydronephrosis. Bladder is unremarkable. Stomach/Bowel: Stomach is within normal limits. Appendix not clearly visualized. No evidence of bowel wall thickening, distention, or inflammatory changes. Descending and sigmoid diverticulosis Vascular/Lymphatic: Aortic atherosclerosis. Interval decrease in size of previously FDG  avid retroperitoneal lymph nodes, largest right common iliac node measuring 0.9 x 0.8 cm (series 2, image 73). Unchanged prominent retroperitoneal lymph nodes, not previously FDG avid (series 2, image 63). Reproductive: Prostatomegaly with median lobe hypertrophy. Other: Small fat containing right inguinal hernia. No ascites. Nearly complete resolution of a retroperitoneal soft tissue nodule overlying the right iliac crest measuring 0.7 x 0.5 cm (series 2, image 87). Musculoskeletal: No acute osseous findings. IMPRESSION: 1. Interval decrease in size of previously FDG avid retroperitoneal lymph nodes, as well as a soft tissue nodule overlying the right iliac crest. 2. Additional prominent lymph nodes adjacent to the hiatal hernia and in the retroperitoneum are unchanged, not previously FDG avid. 3. Findings are consistent with treatment response of lymphoma. 4. Emphysema. 5. Coronary artery disease. 6. Prostatomegaly. Aortic Atherosclerosis (ICD10-I70.0) and Emphysema (ICD10-J43.9). Electronically Signed   By: Jearld Lesch M.D.   On: 07/01/2022 08:39        PHYSICAL EXAMINATION: ECOG PERFORMANCE STATUS: 0 - Asymptomatic  Vitals:   08/26/22 0849  BP: 131/60  Pulse: (!) 52  Resp: 18  Temp: 98 F (36.7 C)  SpO2: 97%   Filed Weights   08/26/22 0849  Weight: 189 lb 6.4 oz (85.9 kg)    GENERAL:alert, no distress  and comfortable NEURO: alert & oriented x 3 with fluent speech, no focal motor/sensory deficits  LABORATORY DATA:  I have reviewed the data as listed    Component Value Date/Time   NA 137 08/26/2022 0814   NA 139 01/28/2022 0822   NA 139 04/14/2016 0943   K 4.3 08/26/2022 0814   K 4.6 04/14/2016 0943   CL 105 08/26/2022 0814   CL 104 03/24/2012 1024   CO2 22 08/26/2022 0814   CO2 25 04/14/2016 0943   GLUCOSE 220 (H) 08/26/2022 0814   GLUCOSE 111 04/14/2016 0943   GLUCOSE 80 03/24/2012 1024   BUN 15 08/26/2022 0814   BUN 12 01/28/2022 0822   BUN 8.6 04/14/2016 0943    CREATININE 1.00 08/26/2022 0814   CREATININE 1.1 04/14/2016 0943   CALCIUM 8.2 (L) 08/26/2022 0814   CALCIUM 9.4 04/14/2016 0943   PROT 6.0 (L) 08/26/2022 0814   PROT 6.4 10/05/2021 1016   PROT 6.5 04/14/2016 0943   ALBUMIN 3.9 08/26/2022 0814   ALBUMIN 4.4 10/05/2021 1016   ALBUMIN 3.8 04/14/2016 0943   AST 17 08/26/2022 0814   AST 28 04/14/2016 0943   ALT 16 08/26/2022 0814   ALT 41 04/14/2016 0943   ALKPHOS 62 08/26/2022 0814   ALKPHOS 71 04/14/2016 0943   BILITOT 0.5 08/26/2022 0814   BILITOT 0.61 04/14/2016 0943   GFRNONAA >60 08/26/2022 0814   GFRAA 69 09/27/2019 0801    No results found for: "SPEP", "UPEP"  Lab Results  Component Value Date   WBC 6.1 08/26/2022   NEUTROABS 3.1 08/26/2022   HGB 14.7 08/26/2022   HCT 43.5 08/26/2022   MCV 85.3 08/26/2022   PLT 145 (L) 08/26/2022      Chemistry      Component Value Date/Time   NA 137 08/26/2022 0814   NA 139 01/28/2022 0822   NA 139 04/14/2016 0943   K 4.3 08/26/2022 0814   K 4.6 04/14/2016 0943   CL 105 08/26/2022 0814   CL 104 03/24/2012 1024   CO2 22 08/26/2022 0814   CO2 25 04/14/2016 0943   BUN 15 08/26/2022 0814   BUN 12 01/28/2022 0822   BUN 8.6 04/14/2016 0943   CREATININE 1.00 08/26/2022 0814   CREATININE 1.1 04/14/2016 0943      Component Value Date/Time   CALCIUM 8.2 (L) 08/26/2022 0814   CALCIUM 9.4 04/14/2016 0943   ALKPHOS 62 08/26/2022 0814   ALKPHOS 71 04/14/2016 0943   AST 17 08/26/2022 0814   AST 28 04/14/2016 0943   ALT 16 08/26/2022 0814   ALT 41 04/14/2016 0943   BILITOT 0.5 08/26/2022 0814   BILITOT 0.61 04/14/2016 1610

## 2022-08-26 NOTE — Patient Instructions (Signed)
 [facility]  Discharge Instructions: Thank you for choosing [facility] to provide your oncology and hematology care.   If you have a lab appointment with the Cancer Center, please go directly to the Cancer Center and check in at the registration area.   Wear comfortable clothing and clothing appropriate for easy access to any Portacath or PICC line.   We strive to give you quality time with your provider. You may need to reschedule your appointment if you arrive late (15 or more minutes).  Arriving late affects you and other patients whose appointments are after yours.  Also, if you miss three or more appointments without notifying the office, you may be dismissed from the clinic at the provider's discretion.      For prescription refill requests, have your pharmacy contact our office and allow 72 hours for refills to be completed.    Today you received the following chemotherapy and/or immunotherapy agents :  Rituximab. (Ruxience).   To help prevent nausea and vomiting after your treatment, we encourage you to take your nausea medication as directed.  BELOW ARE SYMPTOMS THAT SHOULD BE REPORTED IMMEDIATELY: *FEVER GREATER THAN 100.4 F (38 C) OR HIGHER *CHILLS OR SWEATING *NAUSEA AND VOMITING THAT IS NOT CONTROLLED WITH YOUR NAUSEA MEDICATION *UNUSUAL SHORTNESS OF BREATH *UNUSUAL BRUISING OR BLEEDING *URINARY PROBLEMS (pain or burning when urinating, or frequent urination) *BOWEL PROBLEMS (unusual diarrhea, constipation, pain near the anus) TENDERNESS IN MOUTH AND THROAT WITH OR WITHOUT PRESENCE OF ULCERS (sore throat, sores in mouth, or a toothache) UNUSUAL RASH, SWELLING OR PAIN  UNUSUAL VAGINAL DISCHARGE OR ITCHING   Items with * indicate a potential emergency and should be followed up as soon as possible or go to the Emergency Department if any problems should occur.  Please show the CHEMOTHERAPY ALERT CARD or IMMUNOTHERAPY ALERT  CARD at check-in to the Emergency Department and triage nurse.  Should you have questions after your visit or need to cancel or reschedule your appointment, please contact [facility]  Dept: [phone]  and follow the prompts.  Office hours are 8:00 a.m. to 4:30 p.m. Monday - Friday. Please note that voicemails left after 4:00 p.m. may not be returned until the following business day.  We are closed weekends and major holidays. You have access to a nurse at all times for urgent questions. Please call the main number to the clinic Dept: [phone] and follow the prompts.   For any non-urgent questions, you may also contact your provider using MyChart. We now offer e-Visits for anyone 45 and older to request care online for non-urgent symptoms. For details visit [email or web address].   Also download the MyChart app! Go to the app store, search "MyChart", open the app, select [place], and log in with your MyChart username and password.  Rituximab Injection What is this medication? RITUXIMAB (ri TUX i mab) treats leukemia and lymphoma. It works by blocking a protein that causes cancer cells to grow and multiply. This helps to slow or stop the spread of cancer cells. It may also be used to treat autoimmune conditions, such as arthritis. It works by slowing down an overactive immune system. It is a monoclonal antibody. This medicine may be used for other purposes; ask your health care provider or pharmacist if you have questions. COMMON BRAND NAME(S): RIABNI, Rituxan, RUXIENCE, truxima What should I tell my care team before I take this medication? They need  to know if you have any of these conditions: Chest pain Heart disease Immune system problems Infection, such as chickenpox, cold sores, hepatitis B, herpes Irregular heartbeat or rhythm Kidney disease Low blood counts, such as low white cells, platelets, red cells Lung disease Recent or upcoming  vaccine An unusual or allergic reaction to rituximab, other medications, foods, dyes, or preservatives Pregnant or trying to get pregnant Breast-feeding How should I use this medication? This medication is injected into a vein. It is given by a care team in a hospital or clinic setting. A special MedGuide will be given to you before each treatment. Be sure to read this information carefully each time. Talk to your care team about the use of this medication in children. While this medication may be prescribed for children as young as 6 months for selected conditions, precautions do apply. Overdosage: If you think you have taken too much of this medicine contact a poison control center or emergency room at once. NOTE: This medicine is only for you. Do not share this medicine with others. What if I miss a dose? Keep appointments for follow-up doses. It is important not to miss your dose. Call your care team if you are unable to keep an appointment. What may interact with this medication? Do not take this medication with any of the following: Live vaccines This medication may also interact with the following: Cisplatin This list may not describe all possible interactions. Give your health care provider a list of all the medicines, herbs, non-prescription drugs, or dietary supplements you use. Also tell them if you smoke, drink alcohol, or use illegal drugs. Some items may interact with your medicine. What should I watch for while using this medication? Your condition will be monitored carefully while you are receiving this medication. You may need blood work while taking this medication. This medication can cause serious infusion reactions. To reduce the risk your care team may give you other medications to take before receiving this one. Be sure to follow the directions from your care team. This medication may increase your risk of getting an infection. Call your care team for advice if you get a  fever, chills, sore throat, or other symptoms of a cold or flu. Do not treat yourself. Try to avoid being around people who are sick. Call your care team if you are around anyone with measles, chickenpox, or if you develop sores or blisters that do not heal properly. Avoid taking medications that contain aspirin, acetaminophen, ibuprofen, naproxen, or ketoprofen unless instructed by your care team. These medications may hide a fever. This medication may cause serious skin reactions. They can happen weeks to months after starting the medication. Contact your care team right away if you notice fevers or flu-like symptoms with a rash. The rash may be red or purple and then turn into blisters or peeling of the skin. You may also notice a red rash with swelling of the face, lips, or lymph nodes in your neck or under your arms. In some patients, this medication may cause a serious brain infection that may cause death. If you have any problems seeing, thinking, speaking, walking, or standing, tell your care team right away. If you cannot reach your care team, urgently seek another source of medical care. Talk to your care team if you may be pregnant. Serious birth defects can occur if you take this medication during pregnancy and for 12 months after the last dose. You will need a negative pregnancy  test before starting this medication. Contraception is recommended while taking this medication and for 12 months after the last dose. Your care team can help you find the option that works for you. Do not breastfeed while taking this medication and for at least 6 months after the last dose. What side effects may I notice from receiving this medication? Side effects that you should report to your care team as soon as possible: Allergic reactions or angioedema--skin rash, itching or hives, swelling of the face, eyes, lips, tongue, arms, or legs, trouble swallowing or breathing Bowel blockage--stomach cramping, unable to  have a bowel movement or pass gas, loss of appetite, vomiting Dizziness, loss of balance or coordination, confusion or trouble speaking Heart attack--pain or tightness in the chest, shoulders, arms, or jaw, nausea, shortness of breath, cold or clammy skin, feeling faint or lightheaded Heart rhythm changes--fast or irregular heartbeat, dizziness, feeling faint or lightheaded, chest pain, trouble breathing Infection--fever, chills, cough, sore throat, wounds that don't heal, pain or trouble when passing urine, general feeling of discomfort or being unwell Infusion reactions--chest pain, shortness of breath or trouble breathing, feeling faint or lightheaded Kidney injury--decrease in the amount of urine, swelling of the ankles, hands, or feet Liver injury--right upper belly pain, loss of appetite, nausea, light-colored stool, dark yellow or brown urine, yellowing skin or eyes, unusual weakness or fatigue Redness, blistering, peeling, or loosening of the skin, including inside the mouth Stomach pain that is severe, does not go away, or gets worse Tumor lysis syndrome (TLS)--nausea, vomiting, diarrhea, decrease in the amount of urine, dark urine, unusual weakness or fatigue, confusion, muscle pain or cramps, fast or irregular heartbeat, joint pain Side effects that usually do not require medical attention (report to your care team if they continue or are bothersome): Headache Joint pain Nausea Runny or stuffy nose Unusual weakness or fatigue This list may not describe all possible side effects. Call your doctor for medical advice about side effects. You may report side effects to FDA at 1-800-FDA-1088. Where should I keep my medication? This medication is given in a hospital or clinic. It will not be stored at home. NOTE: This sheet is a summary. It may not cover all possible information. If you have questions about this medicine, talk to your doctor, pharmacist, or health care provider.  2024  Elsevier/Gold Standard (2021-05-21 00:00:00)

## 2022-08-26 NOTE — Assessment & Plan Note (Signed)
Overall, he tolerated treatment fairly well without major side effects His recent CT imaging showed excellent response to therapy He will complete cycle 6 of rituximab today.  He will continue lenalidomide.  After today, I will switch his rituximab to every other month.  He will continue lenalidomide until approximately a week before his surgery and he will stop.  He will resume lenalidomide on September 3 I will see him back in October for further follow-up

## 2022-08-27 ENCOUNTER — Other Ambulatory Visit: Payer: Self-pay | Admitting: Hematology and Oncology

## 2022-08-27 ENCOUNTER — Telehealth: Payer: Self-pay | Admitting: Hematology and Oncology

## 2022-08-27 ENCOUNTER — Encounter: Payer: Self-pay | Admitting: Hematology and Oncology

## 2022-08-27 DIAGNOSIS — C8280 Other types of follicular lymphoma, unspecified site: Secondary | ICD-10-CM

## 2022-08-27 NOTE — Telephone Encounter (Signed)
Patient's spouse is aware scheduled appointment times/dates

## 2022-08-27 NOTE — Progress Notes (Signed)
DISCONTINUE OFF PATHWAY REGIMEN - Lymphoma and CLL   [id number]:Lenalidomide 20 mg PO Daily D1-21 + Rituximab 375 mg/m2 IV Z6,1,09,60 Cycle 1 then D1 q28 Days:   A cycle is every 28 days:     Lenalidomide      Rituximab-xxxx      Rituximab-xxxx   **Always confirm dose/schedule in your pharmacy ordering system**  REASON: Other Reason PRIOR TREATMENT: Off Pathway: Lenalidomide 20 mg PO Daily D1-21 + Rituximab 375 mg/m2 IV A5,4,09,81 Cycle 1 then D1 q28 Days TREATMENT RESPONSE: Complete Response (CR)  START OFF PATHWAY REGIMEN - Lymphoma and CLL   [id number]:Rituximab 375 mg/m2 IV D1 q3 Months:   A cycle is every 3 months:     Rituximab-xxxx   **Always confirm dose/schedule in your pharmacy ordering system**  Patient Characteristics: Follicular Lymphoma, Grades 1, 2, and 3A, Third Line, Candidate for CAR T-Cell Therapy, Refer to Specialist Disease Type: Follicular Lymphoma, Grade 1, 2, or 3A Disease Type: Not Applicable Disease Type: Not Applicable Line of Therapy: Third Line Patient Characteristics: Candidate for CAR T-Cell Therapy Please indicate whether you are: Referring to a specialist Intent of Therapy: Non-Curative / Palliative Intent, Discussed with Patient

## 2022-09-01 ENCOUNTER — Ambulatory Visit: Payer: Medicare Other | Admitting: Cardiology

## 2022-09-03 DIAGNOSIS — Z961 Presence of intraocular lens: Secondary | ICD-10-CM | POA: Diagnosis not present

## 2022-09-03 DIAGNOSIS — H524 Presbyopia: Secondary | ICD-10-CM | POA: Diagnosis not present

## 2022-09-03 DIAGNOSIS — H2512 Age-related nuclear cataract, left eye: Secondary | ICD-10-CM | POA: Diagnosis not present

## 2022-09-03 DIAGNOSIS — H04123 Dry eye syndrome of bilateral lacrimal glands: Secondary | ICD-10-CM | POA: Diagnosis not present

## 2022-09-07 ENCOUNTER — Encounter (HOSPITAL_COMMUNITY): Payer: Self-pay

## 2022-09-07 ENCOUNTER — Encounter: Payer: Self-pay | Admitting: Urology

## 2022-09-07 ENCOUNTER — Encounter (HOSPITAL_COMMUNITY)
Admission: RE | Admit: 2022-09-07 | Discharge: 2022-09-07 | Disposition: A | Discharge status: Home / Self Care | Payer: Medicare Other | Source: Ambulatory Visit | Attending: Urology | Admitting: Urology

## 2022-09-07 NOTE — Telephone Encounter (Signed)
I called patient, they reached out to [facility] pre admission and someone informed them they would be getting a call today.  I informed patient to call back later today if they have not heard from pre op and I would reach out for an update.

## 2022-09-09 ENCOUNTER — Encounter (HOSPITAL_COMMUNITY): Payer: Self-pay | Admitting: Urology

## 2022-09-09 ENCOUNTER — Ambulatory Visit (HOSPITAL_BASED_OUTPATIENT_CLINIC_OR_DEPARTMENT_OTHER): Payer: Medicare Other | Admitting: Anesthesiology

## 2022-09-09 ENCOUNTER — Ambulatory Visit (HOSPITAL_COMMUNITY): Payer: Medicare Other | Admitting: Anesthesiology

## 2022-09-09 ENCOUNTER — Encounter (HOSPITAL_COMMUNITY)
Admission: RE | Disposition: A | Discharge status: Home / Self Care | Payer: Self-pay | Source: Home / Self Care | Attending: Urology

## 2022-09-09 ENCOUNTER — Observation Stay (HOSPITAL_COMMUNITY)
Admission: RE | Admit: 2022-09-09 | Discharge: 2022-09-10 | Disposition: A | Discharge status: Home / Self Care | Payer: Medicare Other | Attending: Urology | Admitting: Urology

## 2022-09-09 DIAGNOSIS — N138 Other obstructive and reflux uropathy: Principal | ICD-10-CM

## 2022-09-09 DIAGNOSIS — R7309 Other abnormal glucose: Secondary | ICD-10-CM | POA: Diagnosis not present

## 2022-09-09 DIAGNOSIS — R3914 Feeling of incomplete bladder emptying: Secondary | ICD-10-CM | POA: Diagnosis not present

## 2022-09-09 DIAGNOSIS — N401 Enlarged prostate with lower urinary tract symptoms: Principal | ICD-10-CM

## 2022-09-09 DIAGNOSIS — I2510 Atherosclerotic heart disease of native coronary artery without angina pectoris: Secondary | ICD-10-CM | POA: Diagnosis not present

## 2022-09-09 DIAGNOSIS — J449 Chronic obstructive pulmonary disease, unspecified: Secondary | ICD-10-CM | POA: Diagnosis not present

## 2022-09-09 DIAGNOSIS — I4891 Unspecified atrial fibrillation: Secondary | ICD-10-CM | POA: Insufficient documentation

## 2022-09-09 DIAGNOSIS — Z87891 Personal history of nicotine dependence: Secondary | ICD-10-CM

## 2022-09-09 DIAGNOSIS — N400 Enlarged prostate without lower urinary tract symptoms: Secondary | ICD-10-CM | POA: Diagnosis present

## 2022-09-09 HISTORY — PX: TRANSURETHRAL RESECTION OF PROSTATE: SHX73

## 2022-09-09 HISTORY — PX: CYSTOSCOPY: SHX5120

## 2022-09-09 LAB — CBC
HCT: 43.8 % (ref 39.0–52.0)
Hemoglobin: 14.3 g/dL (ref 13.0–17.0)
MCH: 29.2 pg (ref 26.0–34.0)
MCHC: 32.6 g/dL (ref 30.0–36.0)
MCV: 89.4 fL (ref 80.0–100.0)
Platelets: 153 10*3/uL (ref 150–400)
RBC: 4.9 MIL/uL (ref 4.22–5.81)
RDW: 15.4 % (ref 11.5–15.5)
WBC: 9 10*3/uL (ref 4.0–10.5)
nRBC: 0 % (ref 0.0–0.2)

## 2022-09-09 LAB — GLUCOSE, CAPILLARY
Glucose-Capillary: 253 mg/dL — ABNORMAL HIGH (ref 70–99)
Glucose-Capillary: 289 mg/dL — ABNORMAL HIGH (ref 70–99)

## 2022-09-09 LAB — BASIC METABOLIC PANEL
Anion gap: 12 (ref 5–15)
BUN: 16 mg/dL (ref 8–23)
CO2: 21 mmol/L — ABNORMAL LOW (ref 22–32)
Calcium: 8.4 mg/dL — ABNORMAL LOW (ref 8.9–10.3)
Chloride: 102 mmol/L (ref 98–111)
Creatinine, Ser: 1.05 mg/dL (ref 0.61–1.24)
GFR, Estimated: >60 mL/min (ref >60)
Glucose, Bld: 299 mg/dL — ABNORMAL HIGH (ref 70–99)
Potassium: 4.1 mmol/L (ref 3.5–5.1)
Sodium: 135 mmol/L (ref 135–145)

## 2022-09-09 SURGERY — TURP (TRANSURETHRAL RESECTION OF PROSTATE)
Anesthesia: General | Site: Prostate

## 2022-09-09 MED ORDER — EPHEDRINE SULFATE (PRESSORS) 50 MG/ML IJ SOLN
INTRAMUSCULAR | Status: DC | PRN
  Administered 2022-09-09 (×3): 5 mg via INTRAVENOUS

## 2022-09-09 MED ORDER — SODIUM CHLORIDE 0.9 % IV SOLN: INTRAVENOUS | Status: DC

## 2022-09-09 MED ORDER — HYOSCYAMINE SULFATE 0.125 MG SL SUBL: 0.1250 mg | SUBLINGUAL_TABLET | SUBLINGUAL | Status: DC | PRN

## 2022-09-09 MED ORDER — ONDANSETRON HCL 4 MG/2ML IJ SOLN
INTRAMUSCULAR | Status: DC | PRN
Start: 2022-09-09 — End: 2022-09-09
  Administered 2022-09-09: 4 mg via INTRAVENOUS

## 2022-09-09 MED ORDER — OXYCODONE HCL 5 MG/5ML PO SOLN: 5.0000 mg | Freq: Once | ORAL | Status: DC | PRN

## 2022-09-09 MED ORDER — PANTOPRAZOLE SODIUM 40 MG PO TBEC: 40.0000 mg | DELAYED_RELEASE_TABLET | ORAL | Status: DC

## 2022-09-09 MED ORDER — ZOLPIDEM TARTRATE 5 MG PO TABS: 5.0000 mg | ORAL_TABLET | Freq: Every evening | ORAL | Status: DC | PRN

## 2022-09-09 MED ORDER — ACETAMINOPHEN 325 MG PO TABS: 650.0000 mg | ORAL_TABLET | ORAL | Status: DC | PRN

## 2022-09-09 MED ORDER — SODIUM CHLORIDE 0.9 % IR SOLN
Status: DC | PRN
  Administered 2022-09-09 (×6): 3000 mL

## 2022-09-09 MED ORDER — FINASTERIDE 5 MG PO TABS
5.0000 mg | ORAL_TABLET | Freq: Every day | ORAL | Status: DC
  Administered 2022-09-10: 5 mg via ORAL
  Filled 2022-09-09: qty 1

## 2022-09-09 MED ORDER — ACYCLOVIR 400 MG PO TABS
400.0000 mg | ORAL_TABLET | Freq: Every day | ORAL | Status: DC
  Filled 2022-09-09 (×2): qty 1

## 2022-09-09 MED ORDER — ACYCLOVIR 200 MG PO CAPS
400.0000 mg | ORAL_CAPSULE | Freq: Every day | ORAL | Status: DC
  Administered 2022-09-09 – 2022-09-10 (×2): 400 mg via ORAL
  Filled 2022-09-09 (×2): qty 2

## 2022-09-09 MED ORDER — BRIMONIDINE TARTRATE 0.2 % OP SOLN
1.0000 [drp] | Freq: Two times a day (BID) | OPHTHALMIC | Status: DC
  Administered 2022-09-09 – 2022-09-10 (×2): 1 [drp] via OPHTHALMIC

## 2022-09-09 MED ORDER — ROSUVASTATIN CALCIUM 20 MG PO TABS
20.0000 mg | ORAL_TABLET | Freq: Every day | ORAL | Status: DC
  Administered 2022-09-09 – 2022-09-10 (×2): 20 mg via ORAL
  Filled 2022-09-09 (×2): qty 1

## 2022-09-09 MED ORDER — DEXAMETHASONE SODIUM PHOSPHATE 10 MG/ML IJ SOLN
INTRAMUSCULAR | Status: DC | PRN
  Administered 2022-09-09: 8 mg via INTRAVENOUS

## 2022-09-09 MED ORDER — MECLIZINE HCL 25 MG PO TABS: 25.0000 mg | ORAL_TABLET | Freq: Three times a day (TID) | ORAL | Status: DC | PRN

## 2022-09-09 MED ORDER — CHLORHEXIDINE GLUCONATE CLOTH 2 % EX PADS
6.0000 | MEDICATED_PAD | Freq: Every day | CUTANEOUS | Status: DC
  Administered 2022-09-10: 6 via TOPICAL

## 2022-09-09 MED ORDER — ONDANSETRON HCL 4 MG/2ML IJ SOLN: 4.0000 mg | INTRAMUSCULAR | Status: DC | PRN

## 2022-09-09 MED ORDER — FENTANYL CITRATE PF 50 MCG/ML IJ SOSY: 25.0000 ug | PREFILLED_SYRINGE | INTRAMUSCULAR | Status: DC | PRN

## 2022-09-09 MED ORDER — FENTANYL CITRATE (PF) 100 MCG/2ML IJ SOLN
INTRAMUSCULAR | Status: DC | PRN
  Administered 2022-09-09: 100 ug via INTRAVENOUS

## 2022-09-09 MED ORDER — DIPHENHYDRAMINE HCL 12.5 MG/5ML PO ELIX: 12.5000 mg | ORAL_SOLUTION | Freq: Four times a day (QID) | ORAL | Status: DC | PRN

## 2022-09-09 MED ORDER — SODIUM CHLORIDE 0.9 % IR SOLN
3000.0000 mL | Status: DC
  Administered 2022-09-09 – 2022-09-10 (×5): 3000 mL

## 2022-09-09 MED ORDER — INSULIN ASPART 100 UNIT/ML IJ SOLN
0.0000 [IU] | Freq: Every day | INTRAMUSCULAR | Status: DC
  Administered 2022-09-09: 3 [IU] via SUBCUTANEOUS

## 2022-09-09 MED ORDER — STERILE WATER FOR IRRIGATION IR SOLN
Status: DC | PRN
  Administered 2022-09-09: 500 mL

## 2022-09-09 MED ORDER — SODIUM CHLORIDE 0.9 % IV SOLN
INTRAVENOUS | Status: AC
  Filled 2022-09-09: qty 20

## 2022-09-09 MED ORDER — OMEPRAZOLE MAGNESIUM 20 MG PO TBEC: 20.0000 mg | DELAYED_RELEASE_TABLET | ORAL | Status: DC

## 2022-09-09 MED ORDER — LACTATED RINGERS IV SOLN: INTRAVENOUS | Status: DC

## 2022-09-09 MED ORDER — OXYCODONE HCL 5 MG PO TABS: 5.0000 mg | ORAL_TABLET | ORAL | Status: DC | PRN

## 2022-09-09 MED ORDER — CHLORHEXIDINE GLUCONATE 0.12 % MT SOLN: 15.0000 mL | Freq: Once | OROMUCOSAL | Status: DC

## 2022-09-09 MED ORDER — METOPROLOL SUCCINATE 12.5 MG HALF TABLET
12.5000 mg | ORAL_TABLET | Freq: Two times a day (BID) | ORAL | Status: DC
  Administered 2022-09-09 – 2022-09-10 (×2): 12.5 mg via ORAL
  Filled 2022-09-09 (×9): qty 1

## 2022-09-09 MED ORDER — CHLORHEXIDINE GLUCONATE 0.12 % MT SOLN
15.0000 mL | Freq: Once | OROMUCOSAL | Status: AC
  Administered 2022-09-09: 15 mL via OROMUCOSAL

## 2022-09-09 MED ORDER — SODIUM CHLORIDE 0.9 % IV SOLN
2.0000 g | INTRAVENOUS | Status: AC
  Administered 2022-09-09: 2 g via INTRAVENOUS

## 2022-09-09 MED ORDER — HYDROMORPHONE HCL 1 MG/ML IJ SOLN: 0.5000 mg | INTRAMUSCULAR | Status: DC | PRN

## 2022-09-09 MED ORDER — LACTATED RINGERS IV SOLN
INTRAVENOUS | Status: DC
Start: 2022-09-09 — End: 2022-09-09

## 2022-09-09 MED ORDER — DIPHENHYDRAMINE HCL 50 MG/ML IJ SOLN: 12.5000 mg | Freq: Four times a day (QID) | INTRAMUSCULAR | Status: DC | PRN

## 2022-09-09 MED ORDER — ORAL CARE MOUTH RINSE: 15.0000 mL | Freq: Once | OROMUCOSAL | Status: AC

## 2022-09-09 MED ORDER — EPHEDRINE 5 MG/ML INJ
INTRAVENOUS | Status: AC
  Filled 2022-09-09: qty 5

## 2022-09-09 MED ORDER — SENNOSIDES-DOCUSATE SODIUM 8.6-50 MG PO TABS
2.0000 | ORAL_TABLET | Freq: Every day | ORAL | Status: DC
  Administered 2022-09-09: 2 via ORAL
  Filled 2022-09-09: qty 2

## 2022-09-09 MED ORDER — LIFITEGRAST 5 % OP SOLN
1.0000 [drp] | Freq: Two times a day (BID) | OPHTHALMIC | Status: DC
  Administered 2022-09-09 – 2022-09-10 (×2): 1 [drp] via OPHTHALMIC

## 2022-09-09 MED ORDER — CEFAZOLIN SODIUM-DEXTROSE 2-4 GM/100ML-% IV SOLN: 2.0000 g | INTRAVENOUS | Status: DC

## 2022-09-09 MED ORDER — ONDANSETRON HCL 4 MG/2ML IJ SOLN
INTRAMUSCULAR | Status: AC
  Filled 2022-09-09: qty 4

## 2022-09-09 MED ORDER — PROPOFOL 10 MG/ML IV BOLUS
INTRAVENOUS | Status: DC | PRN
  Administered 2022-09-09: 150 mg via INTRAVENOUS

## 2022-09-09 MED ORDER — ONDANSETRON HCL 4 MG/2ML IJ SOLN: 4.0000 mg | Freq: Once | INTRAMUSCULAR | Status: DC | PRN

## 2022-09-09 MED ORDER — INSULIN ASPART 100 UNIT/ML IJ SOLN
0.0000 [IU] | Freq: Three times a day (TID) | INTRAMUSCULAR | Status: DC
  Administered 2022-09-10: 5 [IU] via SUBCUTANEOUS

## 2022-09-09 MED ORDER — CEFAZOLIN SODIUM-DEXTROSE 2-4 GM/100ML-% IV SOLN
INTRAVENOUS | Status: AC
  Filled 2022-09-09: qty 100

## 2022-09-09 MED ORDER — DEXAMETHASONE SODIUM PHOSPHATE 10 MG/ML IJ SOLN
INTRAMUSCULAR | Status: AC
  Filled 2022-09-09: qty 2

## 2022-09-09 MED ORDER — OXYCODONE HCL 5 MG PO TABS: 5.0000 mg | ORAL_TABLET | Freq: Once | ORAL | Status: DC | PRN

## 2022-09-09 SURGICAL SUPPLY — 25 items
BAG DRAIN URO TABLE W/ADPT NS (BAG) ×1 IMPLANT
BAG DRN 8 ADPR NS SKTRN CSTL (BAG) ×1
BAG DRN URN TUBE DRIP CHMBR (OSTOMY) ×1
BAG HAMPER (MISCELLANEOUS) ×1 IMPLANT
BAG URINE DRAIN TURP 4L (OSTOMY) ×1 IMPLANT
CATH FOLEY 3WAY 30CC 22F (CATHETERS) ×1 IMPLANT
CLOTH BEACON ORANGE TIMEOUT ST (SAFETY) ×1 IMPLANT
ELECT REM PT RETURN 9FT ADLT (ELECTROSURGICAL) ×1
ELECTRODE REM PT RTRN 9FT ADLT (ELECTROSURGICAL) ×1 IMPLANT
GLOVE BIO SURGEON STRL SZ8 (GLOVE) ×1 IMPLANT
GLOVE BIOGEL PI IND STRL 7.0 (GLOVE) ×2 IMPLANT
GOWN STRL REUS W/TWL LRG LVL3 (GOWN DISPOSABLE) ×2 IMPLANT
GOWN STRL REUS W/TWL XL LVL3 (GOWN DISPOSABLE) ×1 IMPLANT
IV NS IRRIG 3000ML ARTHROMATIC (IV SOLUTION) ×4 IMPLANT
KIT TURNOVER CYSTO (KITS) ×1 IMPLANT
LOOP CUT BIPOLAR 24F LRG (ELECTROSURGICAL) ×1 IMPLANT
MANIFOLD NEPTUNE II (INSTRUMENTS) ×1 IMPLANT
PACK CYSTO (CUSTOM PROCEDURE TRAY) ×1 IMPLANT
PAD ARMBOARD 7.5X6 YLW CONV (MISCELLANEOUS) ×1 IMPLANT
POSITIONER HEAD 8X9X4 ADT (SOFTGOODS) ×1 IMPLANT
SYR 30ML LL (SYRINGE) ×1 IMPLANT
SYR TOOMEY IRRIG 70ML (MISCELLANEOUS) ×1
SYRINGE TOOMEY IRRIG 70ML (MISCELLANEOUS) ×1 IMPLANT
TOWEL OR 17X26 4PK STRL BLUE (TOWEL DISPOSABLE) ×1 IMPLANT
WATER STERILE IRR 500ML POUR (IV SOLUTION) ×1 IMPLANT

## 2022-09-09 NOTE — Progress Notes (Signed)
Name: Anthony Kelly DOB: 1945/07/25 MRN: [id number]  Diagnoses: Post-operative state  HPI: Anthony Kelly presents post-operatively. GU History: 1. BPH with elevated PVR.  Today He presents s/p TURP by Dr. Ronne Binning on 09/09/2022.  Pathology:  A. PROSTATE CHIPS, TURP:  Benign prostatic glandular and stomal hyperplasia   Postop course: Today He reports doing well. He reports the catheter is draining well with clear yellow urine. Denies fevers, flank pain, or abdominal pain.    Fall Screening: Do you usually have a device to assist in your mobility? No   Medications: Current Outpatient Medications  Medication Sig Dispense Refill   acetaminophen (TYLENOL) 500 MG tablet Take 500-1,000 mg by mouth every 6 (six) hours as needed for moderate pain.     acyclovir (ZOVIRAX) 400 MG tablet Take 1 tablet (400 mg total) by mouth daily. 90 tablet 1   apixaban (ELIQUIS) 5 MG TABS tablet TAKE 1 TABLET TWICE A DAY 180 tablet 2   Ascorbic Acid (VITAMIN C) 1000 MG tablet Take 1,000 mg by mouth daily.     brimonidine (ALPHAGAN) 0.2 % ophthalmic solution Place 1 drop into both eyes 2 (two) times daily.      Calcium Carbonate-Vit D-Min (CALCIUM 1200) 1200-1000 MG-UNIT CHEW Chew 1 tablet by mouth daily.     finasteride (PROSCAR) 5 MG tablet Take 1 tablet (5 mg total) by mouth daily. 90 tablet 3   lenalidomide (REVLIMID) 15 MG capsule TAKE 1 CAPSULE DAILY FOR 21 DAYS ON, THEN 7 DAYS OFF, REPEAT EVERY 28 DAYS 21 capsule 0   Lifitegrast (XIIDRA) 5 % SOLN Place 1 drop into both eyes 2 (two) times daily.     LUMIGAN 0.01 % SOLN Place 1 drop into both eyes at bedtime.     Magnesium 250 MG TABS Take 1 tablet by mouth daily.     metoprolol succinate (TOPROL-XL) 25 MG 24 hr tablet Take 0.5 tablets (12.5 mg total) by mouth daily. (Patient taking differently: Take 12.5 mg by mouth 2 (two) times daily.) 90 tablet 3   Misc Natural Products (BEET ROOT PO) Take 1 Dose by mouth daily.     Multiple Vitamin  (MULTIVITAMIN WITH MINERALS) TABS tablet Take 1 tablet by mouth daily.      omeprazole (PRILOSEC OTC) 20 MG tablet Take 20 mg by mouth every other day.     Polyethyl Glycol-Propyl Glycol (SYSTANE OP) Place 1 drop into both eyes daily as needed (dry eyes).     polyethylene glycol (MIRALAX / GLYCOLAX) 17 g packet Take 8.5 g by mouth daily.     rosuvastatin (CRESTOR) 20 MG tablet Take 1 tablet (20 mg total) by mouth daily. 90 tablet 3   traMADol (ULTRAM) 50 MG tablet Take 1 tablet (50 mg total) by mouth every 6 (six) hours as needed. 15 tablet 0   No current facility-administered medications for this visit.    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 04/2013 -adm with CP, elevated CRP. H/o coronary artery calcification on prior CT but nuc was negative, EF 71%.   Arthritis    Atrial fibrillation (HCC)    a. Isolated episode in the setting of acute pericarditis 04/2013. Was not placed on anticoag.   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   Metastasis to lung (HCC) dx'd 01/2007   Metastasis to lymph nodes (HCC) dx'd 03/2011   lt submandibular ln   Pain in joint, pelvic region and thigh 08/01/2013   PSVT (paroxysmal supraventricular tachycardia)    SCC (squamous cell carcinoma)    Left cheek   Past Surgical History:  Procedure Laterality Date   ATRIAL FIBRILLATION ABLATION N/A  02/17/2022   Procedure: ATRIAL FIBRILLATION ABLATION;  Surgeon: Maurice Small, MD;  Location: [facility] INVASIVE CV LAB;  Service: Cardiovascular;  Laterality: N/A;   CHOLECYSTECTOMY     EXPLORATORY LAPAROTOMY     EYE SURGERY Right 2016   cataract   LEFT HEART CATH AND CORONARY ANGIOGRAPHY N/A 06/05/2019   Procedure: LEFT HEART CATH AND CORONARY ANGIOGRAPHY;  Surgeon: Corky Crafts, MD;  Location: [facility] INVASIVE CV LAB;  Service: Cardiovascular;  Laterality: N/A;   LUNG LOBECTOMY     right side   LYMPH NODE BIOPSY     in groin with removal   MENISCUS REPAIR     right knee   MOHS SURGERY Left 09/2020   cheek   PROSTATE SURGERY     REFRACTIVE SURGERY Right    piece of metal removed   SUBMANDIBULAR GLAND EXCISION  04/2011   SUBMANDIBULAR GLAND EXCISION  04/30/2011   Procedure: EXCISION SUBMANDIBULAR GLAND;  Surgeon: Osborn Coho, MD;  Location: [facility] OR;  Service: ENT;  Laterality: Left;  WITH DIAGNOSTIC BIOPSY   TONSILLECTOMY     as a child   VEIN LIGATION AND STRIPPING     right leg   Family History  Problem Relation Age of Onset   Heart disease Father    Heart attack Father        x 3   Cancer Father    Cancer Sister        breast ca   Cancer Brother        prostate ca   Heart attack Sister    Cancer Sister        breast   Heart disease Brother    Alzheimer's disease Sister    Diabetes Sister    Heart disease Brother    Diabetes Brother    Heart disease Brother    Heart disease Brother    Heart disease Brother    Heart disease Brother    Heart disease Brother    Alzheimer's disease Brother    Diabetes Son    Cancer Brother    Anesthesia problems Neg Hx    Social History   Socioeconomic History   Marital status: Married    Spouse name: Kendal Hymen   Number of children: 2   Years of education: GED   Highest education level: GED or equivalent  Occupational History   Occupation: Retired     Comment: Multimedia programmer   Occupation: Retired     Comment:  [employer]  Tobacco Use   Smoking status: Former    Current packs/day: 0.00    Average packs/day: 1 pack/day for 21.7 years (21.7 ttl pk-yrs)    Types: Cigarettes    Start date: 12/12/1962  Quit date: 08/25/1984    Years since quitting: 38.0   Smokeless tobacco: Never  Vaping Use   Vaping status: Never Used  Substance and Sexual Activity   Alcohol use: Not Currently   Drug use: Never   Sexual activity: Not Currently  Other Topics Concern   Not on file  Social History Narrative   Right handed   Decaf coffee (2-3 per day)   Lives with wife   Social Determinants of Health   Financial Resource Strain: Low Risk  (02/26/2022)   Overall Financial Resource Strain (CARDIA)    Difficulty of Paying Living Expenses: Not hard at all  Food Insecurity: No Food Insecurity (09/10/2022)   Hunger Vital Sign    Worried About Running Out of Food in the Last Year: Never true    Ran Out of Food in the Last Year: Never true  Transportation Needs: No Transportation Needs (09/10/2022)   PRAPARE - Administrator, Civil Service (Medical): No    Lack of Transportation (Non-Medical): No  Physical Activity: Insufficiently Active (02/26/2022)   Exercise Vital Sign    Days of Exercise per Week: 3 days    Minutes of Exercise per Session: 30 min  Stress: No Stress Concern Present (02/26/2022)   [employer] of Occupational Health - Occupational Stress Questionnaire    Feeling of Stress : Not at all  Social Connections: Moderately Integrated (02/26/2022)   Social Connection and Isolation Panel [NHANES]    Frequency of Communication with Friends and Family: More than three times a week    Frequency of Social Gatherings with Friends and Family: More than three times a week    Attends Religious Services: More than 4 times per year    Active Member of [employer] or Organizations: No    Attends Banker Meetings: Never    Marital Status: Married  Catering manager Violence: Not At Risk  (09/10/2022)   Humiliation, Afraid, Rape, and Kick questionnaire    Fear of Current or Ex-Partner: No    Emotionally Abused: No    Physically Abused: No    Sexually Abused: No    SUBJECTIVE  Review of Systems Constitutional: Patient denies any unintentional weight loss or change in strength lntegumentary: Patient denies any rashes or pruritus Cardiovascular: Patient denies chest pain or syncope Respiratory: Patient denies shortness of breath Gastrointestinal: Patient denies nausea, vomiting, constipation, or diarrhea Musculoskeletal: Patient denies muscle cramps or weakness Neurologic: Patient denies convulsions or seizures Psychiatric: Patient denies memory problems Allergic/Immunologic: Patient denies recent allergic reaction(s) Hematologic/Lymphatic: Patient denies bleeding tendencies Endocrine: Patient denies heat/cold intolerance  GU: As per HPI.  OBJECTIVE Vitals:   09/14/22 0911  BP: 111/75  Pulse: 65  Temp: 97.7 F (36.5 C)   There is no height or weight on file to calculate BMI.  Physical Examination Constitutional: No obvious distress; patient is non-toxic appearing  Cardiovascular: No visible lower extremity edema.  Respiratory: The patient does not have audible wheezing/stridor; respirations do not appear labored  Gastrointestinal: Abdomen non-distended Musculoskeletal: Normal ROM of UEs  Skin: No obvious rashes/open sores  Neurologic: CN 2-12 grossly intact Psychiatric: Answered questions appropriately with normal affect  Hematologic/Lymphatic/Immunologic: No obvious bruises or sites of spontaneous bleeding  ASSESSMENT S/P TURP - Plan: Bladder Voiding Trial, ciprofloxacin (CIPRO) tablet 500 mg  Benign prostatic hyperplasia with urinary frequency  Postop check   We reviewed the operative procedures and findings. He is doing well; passed voiding trial today. Will plan for follow up in 3  months or sooner if needed. Pt verbalized understanding and  agreement. All questions were answered.  PLAN Advised the following: Foley catheter discontinued. Return in about 3 months (around 12/14/2022) for UA, PVR, & f/u with Evette Georges NP.  Orders Placed This Encounter  Procedures   Bladder Voiding Trial    It has been explained that the patient is to follow regularly with their PCP in addition to all other providers involved in their care and to follow instructions provided by these respective offices. Patient advised to contact urology clinic if any urologic-pertaining questions, concerns, new symptoms or problems arise in the interim period.  There are no Patient Instructions on file for this visit.  Electronically signed by:  Donnita Falls, MSN, FNP-C, CUNP 09/14/2022 9:33 AM

## 2022-09-09 NOTE — Op Note (Signed)
Preoperative diagnosis: BPH  Postoperative diagnosis: BPH  Procedure: 1 cystoscopy 2. Transurethral resection of the prostate  Attending: Wilkie Aye  Anesthesia: General  Estimated blood loss: Minimal  Drains: 22 French foley  Specimens: 1. Prostate Chips  Antibiotics: Rocephin  Findings: Trilobar prostate enlargement. Ureteral orifices in normal anatomic location.   Indications: Patient is a 77 year old male with a history of BPH and elevated PVR.  After discussing treatment options, they decided proceed with transurethral resection of the prostate.  Procedure in detail: The patient was brought to the operating room and a brief timeout was done to ensure correct patient, correct procedure, correct site.  General anesthesia was administered patient was placed in dorsal lithotomy position.  Their genitalia was then prepped and draped in usual sterile fashion.  A rigid 22 French cystoscope was passed in the urethra and the bladder.  Bladder was inspected and we noted no masses or lesions.  the ureteral orifices were in the normal orthotopic locations. removed the cystoscope and placed a resectoscope into the bladder. We then turned our attention to the prostate resection. Using the bipolar resectoscope we resected the median lobe first from the bladder neck to the verumontanum. We then started at the 12 oclock position on the left lobe and resection to the 6 o'clock position from the bladder neck to the verumontanum. We then did the same resection of the right lobe. Once the resection was complete we then cauterized individual bleeders. We then removed the prostate chips and sent them for pathology.  We then re-inspected the prostatic fossa and found no residual bleeding.  the bladder was then drained, a 22 French foley was placed and this concluded the procedure which was well tolerated by patient.  Complications: None  Condition: Stable, extubated, transferred to PACU  Plan:  Patient is admitted overnight with continuous bladder irrigation. If their urine is clear tomorrow they will be discharged home and followup in 5 days for foley catheter removal and pathology discussion.

## 2022-09-09 NOTE — Progress Notes (Signed)
Patient assisted with getting up out of bed. Ambulated to the end of the hallway and back to the room. Tolerated well. No complaints of pain or discomfort. Plan of care ongoing.

## 2022-09-09 NOTE — Transfer of Care (Signed)
Immediate Anesthesia Transfer of Care Note  Patient: Anthony Kelly  Procedure(s) Performed: TRANSURETHRAL RESECTION OF THE PROSTATE (TURP) (Prostate) CYSTOSCOPY (Prostate)  Patient Location: PACU  Anesthesia Type:General  Level of Consciousness: awake, alert , oriented, and patient cooperative  Airway & Oxygen Therapy: Patient Spontanous Breathing and Patient connected to face mask oxygen  Post-op Assessment: Report given to RN, Post -op Vital signs reviewed and stable, and Patient moving all extremities X 4  Post vital signs: Reviewed and stable  Last Vitals:  Vitals Value Taken Time  BP 122/79 09/09/22 1049  Temp    Pulse 80 09/09/22 1051  Resp 16 09/09/22 1051  SpO2 94 % 09/09/22 1051  Vitals shown include unfiled device data.  Last Pain:  Vitals:   09/09/22 0757  TempSrc: Oral  PainSc: 0-No pain         Complications: No notable events documented.

## 2022-09-09 NOTE — Anesthesia Preprocedure Evaluation (Signed)
Anesthesia Evaluation  Patient identified by MRN, date of birth, ID band Patient awake    Reviewed: Allergy & Precautions, H&P , NPO status , Patient's Chart, lab work & pertinent test results, reviewed documented beta blocker date and time   Airway Mallampati: II  TM Distance: >3 FB Neck ROM: full    Dental no notable dental hx.    Pulmonary neg pulmonary ROS, COPD, former smoker   Pulmonary exam normal breath sounds clear to auscultation       Cardiovascular Exercise Tolerance: Good + CAD  negative cardio ROS + dysrhythmias Atrial Fibrillation  Rhythm:regular Rate:Normal     Neuro/Psych  Neuromuscular disease negative neurological ROS  negative psych ROS   GI/Hepatic negative GI ROS, Neg liver ROS,GERD  ,,  Endo/Other  negative endocrine ROS    Renal/GU negative Renal ROS  negative genitourinary   Musculoskeletal   Abdominal   Peds  Hematology negative hematology ROS (+)   Anesthesia Other Findings   Reproductive/Obstetrics negative OB ROS                             Anesthesia Physical Anesthesia Plan  ASA: 3  Anesthesia Plan: General and General LMA   Post-op Pain Management:    Induction:   PONV Risk Score and Plan: Ondansetron  Airway Management Planned:   Additional Equipment:   Intra-op Plan:   Post-operative Plan:   Informed Consent: I have reviewed the patients History and Physical, chart, labs and discussed the procedure including the risks, benefits and alternatives for the proposed anesthesia with the patient or authorized representative who has indicated his/her understanding and acceptance.     Dental Advisory Given  Plan Discussed with: CRNA  Anesthesia Plan Comments:        Anesthesia Quick Evaluation

## 2022-09-09 NOTE — Anesthesia Procedure Notes (Signed)
Procedure Name: LMA Insertion Date/Time: 09/09/2022 9:44 AM  Performed by: Shanon Payor, CRNAPre-anesthesia Checklist: Patient identified, Emergency Drugs available, Suction available, Patient being monitored and Timeout performed Patient Re-evaluated:Patient Re-evaluated prior to induction Oxygen Delivery Method: Circle system utilized Preoxygenation: Pre-oxygenation with 100% oxygen Induction Type: IV induction LMA Size: 5.0 Number of attempts: 1 Placement Confirmation: positive ETCO2, CO2 detector and breath sounds checked- equal and bilateral Tube secured with: Tape Dental Injury: Teeth and Oropharynx as per pre-operative assessment

## 2022-09-09 NOTE — H&P (Signed)
HPI: Anthony Kelly is a 76yo here for fTURP for BPH with incomplete emptying. He notes no improvement with finasteride and rapaflo. IPSS 24 QOL 5. 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 04/2013 -adm with CP, elevated CRP. H/o coronary artery calcification on prior CT but nuc was negative, EF 71%.   Arthritis     Atrial fibrillation (HCC)      a. Isolated episode in the setting of acute pericarditis 04/2013. Was not placed on anticoag.   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   Metastasis to lung (HCC) dx'd 01/2007   Metastasis to lymph nodes (HCC) dx'd 03/2011    lt submandibular ln   Pain in joint, pelvic region and thigh 08/01/2013   PSVT (paroxysmal supraventricular tachycardia)     SCC (squamous cell carcinoma)      Left cheek          Surgical History:      Past Surgical History:  Procedure Laterality Date   ATRIAL FIBRILLATION ABLATION N/A 02/17/2022    Procedure: ATRIAL FIBRILLATION ABLATION;  Surgeon: Maurice Small, MD;  Location: [facility] INVASIVE CV LAB;  Service: Cardiovascular;  Laterality: N/A;   CHOLECYSTECTOMY       EXPLORATORY LAPAROTOMY       EYE SURGERY Right 2016    cataract   LEFT HEART CATH AND CORONARY ANGIOGRAPHY N/A 06/05/2019    Procedure: LEFT HEART CATH AND CORONARY ANGIOGRAPHY;  Surgeon: Corky Crafts, MD;  Location: [facility]  INVASIVE CV LAB;  Service: Cardiovascular;  Laterality: N/A;   LUNG LOBECTOMY        right side   LYMPH NODE BIOPSY        in groin with removal   MENISCUS REPAIR        right knee   MOHS SURGERY Left 09/2020    cheek   PROSTATE SURGERY       REFRACTIVE SURGERY Right      piece of metal removed   SUBMANDIBULAR GLAND EXCISION   04/2011   SUBMANDIBULAR GLAND EXCISION   04/30/2011    Procedure: EXCISION SUBMANDIBULAR GLAND;  Surgeon: Osborn Coho, MD;  [facility];  Service: ENT;  Laterality: Left;  WITH DIAGNOSTIC BIOPSY   TONSILLECTOMY        as a child   VEIN LIGATION AND STRIPPING        right leg          Home Medications:  Allergies as of 07/19/2022         Reactions    Rituximab Other (See Comments)    2015 pt had reaction            Medication List           Accurate as of  July 19, 2022 12:03 PM. If you have any questions, ask your nurse or doctor.              acyclovir 400 MG tablet Commonly known as: ZOVIRAX Take 1 tablet (400 mg total) by mouth daily.    brimonidine 0.2 % ophthalmic solution Commonly known as: ALPHAGAN Place 1 drop into both eyes 2 (two) times daily.    CALCIUM 1200 PO Take 1,200 mg by mouth daily.    cyclobenzaprine 5 MG tablet Commonly known as: FLEXERIL Take 1 tablet (5 mg total) by mouth 3 (three) times daily as needed for muscle spasms.    Eliquis 5 MG Tabs tablet Generic drug: apixaban TAKE 1 TABLET TWICE A DAY    finasteride 5 MG tablet Commonly known as: PROSCAR Take 1 tablet (5 mg total) by mouth daily.    lenalidomide 15 MG capsule Commonly known as: Revlimid TAKE 1 CAPSULE DAILY FOR 21 DAYS ON, THEN 7 DAYS OFF, REPEAT EVERY 28 DAYS    Lumigan 0.01 % Soln Generic drug: bimatoprost Place 1 drop into both eyes at bedtime.    Magnesium 250 MG Tabs Take 1 tablet by mouth daily.    meclizine 25 MG tablet Commonly known as: ANTIVERT Take 1 tablet (25 mg total) by mouth 3 (three) times daily as needed for  dizziness.    metoprolol succinate 25 MG 24 hr tablet Commonly known as: TOPROL-XL Take 0.5 tablets (12.5 mg total) by mouth daily.    mometasone 0.1 % cream Commonly known as: ELOCON Apply 1 Application topically 2 (two) times daily as needed (eczema).    multivitamin with minerals Tabs tablet Take 1 tablet by mouth daily.    omeprazole 20 MG tablet Commonly known as: PRILOSEC OTC Take 20 mg by mouth every other day.    ondansetron 8 MG tablet Commonly known as: Zofran Take 1 tablet (8 mg total) by mouth every 8 (eight) hours as needed for nausea or vomiting.    prochlorperazine 10 MG tablet Commonly known as: COMPAZINE Take 1 tablet (10 mg total) by mouth every 6 (six) hours as needed for nausea or vomiting.    rosuvastatin 20 MG tablet Commonly known as: CRESTOR Take 1 tablet (20 mg total) by mouth daily.    silodosin 8 MG Caps capsule Commonly known as: RAPAFLO Take 1 capsule (8 mg total) by mouth daily with breakfast.    Superior Probiotic Caps Take 1 capsule by mouth daily.    SYSTANE OP Place 1 drop into both eyes daily as needed (dry eyes).    vitamin C 1000 MG tablet Take 1,000 mg by mouth daily.    Xiidra 5 % Soln Generic drug: Lifitegrast Apply 1 drop to eye 2 (two) times daily.             Allergies:  Allergies       Allergies  Allergen Reactions   Rituximab Other (See Comments)      2015 pt had reaction        Family History:      Family History  Problem Relation Age of Onset   Heart disease Father     Heart attack Father          x 3   Cancer Father     Cancer Sister          breast ca   Cancer Brother          prostate ca   Heart attack Sister  Cancer Sister          breast   Heart disease Brother     Alzheimer's disease Sister     Diabetes Sister     Heart disease Brother     Diabetes Brother     Heart disease Brother     Heart disease Brother     Heart disease Brother     Heart disease Brother     Heart disease  Brother     Alzheimer's disease Brother     Diabetes Son     Cancer Brother     Anesthesia problems Neg Hx            Social History:  reports that he quit smoking about 37 years ago. His smoking use included cigarettes. He started smoking about 59 years ago. He has a 15.00 pack-year smoking history. He has never used smokeless tobacco. He reports that he does not currently use alcohol. He reports that he does not use drugs.   ROS: All other review of systems were reviewed and are negative except what is noted above in HPI   Physical Exam: BP 127/72   Pulse 76   Constitutional:  Alert and oriented, No acute distress. HEENT: [place] AT, moist mucus membranes.  Trachea midline, no masses. Cardiovascular: No clubbing, cyanosis, or edema. Respiratory: Normal respiratory effort, no increased work of breathing. GI: Abdomen is soft, nontender, nondistended, no abdominal masses GU: No CVA tenderness.  Lymph: No cervical or inguinal lymphadenopathy. Skin: No rashes, bruises or suspicious lesions. Neurologic: Grossly intact, no focal deficits, moving all 4 extremities. Psychiatric: Normal mood and affect.   Laboratory Data: Recent Labs       Lab Results  Component Value Date    WBC 6.1 07/01/2022    HGB 15.4 07/01/2022    HCT 46.1 07/01/2022    MCV 88.7 07/01/2022    PLT 142 (L) 07/01/2022        Recent Labs       Lab Results  Component Value Date    CREATININE 1.10 07/01/2022        Recent Labs       Lab Results  Component Value Date    PSA 0.6 01/18/2014        Recent Labs       Lab Results  Component Value Date    TESTOSTERONE 269 10/08/2015        Recent Labs       Lab Results  Component Value Date    HGBA1C 6.2 (H) 04/05/2022        Urinalysis Labs (Brief)          Component Value Date/Time    COLORURINE YELLOW 12/07/2013 1630    APPEARANCEUR Clear 01/22/2022 1143    LABSPEC 1.009 12/07/2013 1630    PHURINE 6.0 12/07/2013 1630    GLUCOSEU  Negative 01/22/2022 1143    HGBUR NEGATIVE 12/07/2013 1630    BILIRUBINUR Negative 01/22/2022 1143    KETONESUR NEGATIVE 12/07/2013 1630    PROTEINUR Negative 01/22/2022 1143    PROTEINUR NEGATIVE 12/07/2013 1630    UROBILINOGEN negative 08/09/2014 1207    UROBILINOGEN 0.2 12/07/2013 1630    NITRITE Negative 01/22/2022 1143    NITRITE NEGATIVE 12/07/2013 1630    LEUKOCYTESUR Negative 01/22/2022 1143        Recent Labs       Lab Results  Component Value Date    LABMICR <3.0 04/05/2022    WBCUA None seen 06/07/2017  RBCUA 0-2 06/07/2017    LABEPIT None seen 06/07/2017    MUCUS neg 08/09/2014    BACTERIA None seen 06/07/2017        Pertinent Imaging:   No results found for this or any previous visit.   No results found for this or any previous visit.   No results found for this or any previous visit.   No results found for this or any previous visit.   No results found for this or any previous visit.   No valid procedures specified. No results found for this or any previous visit.   No results found for this or any previous visit.        Assessment & Plan:     1. . BPH with obstruction/lower urinary tract symptoms We discussed the management of his BPH including continued medical therapy, Rezum, Urolift, TURP and simple prostatectomy. After discussing the options the patient has elected to proceed with TURP. Risks/benefits/alternatives discussed.   - Urinalysis, Routine w reflex microscopic     No follow-ups on file.   Wilkie Aye, MD   [facility] [place]

## 2022-09-09 NOTE — Progress Notes (Signed)
Patient expressed concern about blood glucose that was checked on arrival to the floor at 1620 today. Documented 299. Stated, "I am not diabetic and don't have any issues with blood glucose". I checked at blood glucose at 2100 and it was 289. MD on call and attending MD notified via secure chat. New orders received. Plan of care ongoing.

## 2022-09-10 ENCOUNTER — Other Ambulatory Visit: Payer: Self-pay

## 2022-09-10 ENCOUNTER — Encounter: Payer: Self-pay | Admitting: Family Medicine

## 2022-09-10 DIAGNOSIS — R7309 Other abnormal glucose: Secondary | ICD-10-CM | POA: Diagnosis not present

## 2022-09-10 DIAGNOSIS — I4891 Unspecified atrial fibrillation: Secondary | ICD-10-CM | POA: Diagnosis not present

## 2022-09-10 DIAGNOSIS — R3914 Feeling of incomplete bladder emptying: Secondary | ICD-10-CM | POA: Diagnosis not present

## 2022-09-10 DIAGNOSIS — N401 Enlarged prostate with lower urinary tract symptoms: Secondary | ICD-10-CM | POA: Diagnosis not present

## 2022-09-10 DIAGNOSIS — Z87891 Personal history of nicotine dependence: Secondary | ICD-10-CM | POA: Diagnosis not present

## 2022-09-10 LAB — BASIC METABOLIC PANEL
Anion gap: 7 (ref 5–15)
BUN: 14 mg/dL (ref 8–23)
CO2: 24 mmol/L (ref 22–32)
Calcium: 8.3 mg/dL — ABNORMAL LOW (ref 8.9–10.3)
Chloride: 105 mmol/L (ref 98–111)
Creatinine, Ser: 0.82 mg/dL (ref 0.61–1.24)
GFR, Estimated: >60 mL/min (ref >60)
Glucose, Bld: 147 mg/dL — ABNORMAL HIGH (ref 70–99)
Potassium: 4.5 mmol/L (ref 3.5–5.1)
Sodium: 136 mmol/L (ref 135–145)

## 2022-09-10 LAB — CBC
HCT: 39.8 % (ref 39.0–52.0)
Hemoglobin: 13.2 g/dL (ref 13.0–17.0)
MCH: 29.3 pg (ref 26.0–34.0)
MCHC: 33.2 g/dL (ref 30.0–36.0)
MCV: 88.2 fL (ref 80.0–100.0)
Platelets: 164 10*3/uL (ref 150–400)
RBC: 4.51 MIL/uL (ref 4.22–5.81)
RDW: 15.7 % — ABNORMAL HIGH (ref 11.5–15.5)
WBC: 12.6 10*3/uL — ABNORMAL HIGH (ref 4.0–10.5)
nRBC: 0 % (ref 0.0–0.2)

## 2022-09-10 LAB — HEMOGLOBIN A1C
Hgb A1c MFr Bld: 6.6 % — ABNORMAL HIGH (ref 4.8–5.6)
Mean Plasma Glucose: 142.72 mg/dL

## 2022-09-10 LAB — GLUCOSE, CAPILLARY
Glucose-Capillary: 118 mg/dL — ABNORMAL HIGH (ref 70–99)
Glucose-Capillary: 217 mg/dL — ABNORMAL HIGH (ref 70–99)

## 2022-09-10 MED ORDER — TRAMADOL HCL 50 MG PO TABS
50.0000 mg | ORAL_TABLET | Freq: Four times a day (QID) | ORAL | 0 refills | Status: DC | PRN
Start: 2022-09-10 — End: 2022-10-04

## 2022-09-10 NOTE — Progress Notes (Signed)
Patient heart rate is 59. MD on call made aware.

## 2022-09-10 NOTE — Progress Notes (Signed)
Mobility Specialist Progress Note:    09/10/22 1000  Mobility  Activity Ambulated with assistance in hallway  Level of Assistance Modified independent, requires aide device or extra time  Assistive Device Other (Comment) (IV pole)  Distance Ambulated (ft) 275 ft  Range of Motion/Exercises Active;All extremities  Activity Response Tolerated well  Mobility Referral Yes  $Mobility charge 1 Mobility  Mobility Specialist Start Time (ACUTE ONLY) 1000  Mobility Specialist Stop Time (ACUTE ONLY) 1010  Mobility Specialist Time Calculation (min) (ACUTE ONLY) 10 min   Pt received in bed, agreeable to mobility. Required ModI and CGA to stand and ambulate with IV pole and handrails. Tolerated well, asx throughout. Returned pt to room, family at bedside. All needs met.   Lawerance Bach Mobility Specialist Please contact via Special educational needs teacher or  Rehab office at [phone]

## 2022-09-10 NOTE — Progress Notes (Signed)
Patient has discharge orders, discharge instructions given with no further questions at this time. Patient and Wife bedside were both shown how to empty urinary catheter and demonstrated with teach back. Pt wheeled down to main lobby by staff to vehicle accompanied by wife.

## 2022-09-10 NOTE — Progress Notes (Signed)
Mobility Specialist Progress Note:    09/10/22 1200  Mobility  Activity Ambulated with assistance in hallway  Level of Assistance Contact guard assist, steadying assist  Assistive Device None  Distance Ambulated (ft) 450 ft  Range of Motion/Exercises Active;All extremities  Activity Response Tolerated well  Mobility Referral Yes  $Mobility charge 1 Mobility  Mobility Specialist Start Time (ACUTE ONLY) 1200  Mobility Specialist Stop Time (ACUTE ONLY) 1215  Mobility Specialist Time Calculation (min) (ACUTE ONLY) 15 min   Pt received in hall, eager for mobility. Required CGA to ambulate with no AD. Tolerated well, asx throughout. Returned pt to chair, wife in room. All needs met.   Lawerance Bach Mobility Specialist Please contact via Special educational needs teacher or  Rehab office at [phone]

## 2022-09-10 NOTE — Anesthesia Postprocedure Evaluation (Signed)
Anesthesia Post Note  Patient: Anthony Kelly  Procedure(s) Performed: TRANSURETHRAL RESECTION OF THE PROSTATE (TURP) (Prostate) CYSTOSCOPY (Prostate)  Patient location during evaluation: Phase II Anesthesia Type: General Level of consciousness: awake Pain management: pain level controlled Vital Signs Assessment: post-procedure vital signs reviewed and stable Respiratory status: spontaneous breathing and respiratory function stable Cardiovascular status: blood pressure returned to baseline and stable Postop Assessment: no headache and no apparent nausea or vomiting Anesthetic complications: no Comments: Late entry   No notable events documented.   Last Vitals:  Vitals:   09/09/22 2355 09/10/22 0324  BP: (!) 99/58 105/67  Pulse: (!) 59 60  Resp:    Temp: 36.5 C 36.4 C  SpO2: 92% 95%    Last Pain:  Vitals:   09/10/22 1450  TempSrc:   PainSc: 0-No pain                 Windell Norfolk

## 2022-09-10 NOTE — Progress Notes (Signed)
   09/10/22 1351  TOC Brief Assessment  Insurance and Status Reviewed  Patient has primary care physician Yes  Home environment has been reviewed yes  Prior level of function: ind  Prior/Current Home Services No current home services  Social Determinants of Health Reivew SDOH reviewed no interventions necessary  Readmission risk has been reviewed Yes  Transition of care needs no transition of care needs at this time    Transition of Care Department [facility]) has reviewed patient and no TOC needs have been identified at this time. We will continue to monitor patient advancement through interdisciplinary progression rounds. If new patient transition needs arise, please place a TOC consult.

## 2022-09-10 NOTE — Progress Notes (Signed)
Urine color is clear at 0410 and 0517. CBI rate reduced slightly. No complaints of pain or discomfort at this time. Patient spouse at bedside. Plan of care ongoing.

## 2022-09-14 ENCOUNTER — Ambulatory Visit: Payer: Medicare Other | Admitting: Urology

## 2022-09-14 ENCOUNTER — Encounter: Payer: Self-pay | Admitting: Urology

## 2022-09-14 VITALS — BP 111/75 | HR 65 | Temp 97.7°F

## 2022-09-14 DIAGNOSIS — N401 Enlarged prostate with lower urinary tract symptoms: Secondary | ICD-10-CM

## 2022-09-14 DIAGNOSIS — Z9079 Acquired absence of other genital organ(s): Secondary | ICD-10-CM

## 2022-09-14 DIAGNOSIS — Z09 Encounter for follow-up examination after completed treatment for conditions other than malignant neoplasm: Secondary | ICD-10-CM | POA: Diagnosis not present

## 2022-09-14 DIAGNOSIS — R350 Frequency of micturition: Secondary | ICD-10-CM

## 2022-09-14 DIAGNOSIS — Z87438 Personal history of other diseases of male genital organs: Secondary | ICD-10-CM | POA: Diagnosis not present

## 2022-09-14 MED ORDER — CIPROFLOXACIN HCL 500 MG PO TABS
500.0000 mg | ORAL_TABLET | Freq: Once | ORAL | Status: AC
Start: 2022-09-14 — End: 2022-09-14
  Administered 2022-09-14: 500 mg via ORAL

## 2022-09-14 NOTE — Progress Notes (Signed)
Fill and Pull Catheter Removal  Patient is present today for a catheter removal.  Patient was cleaned and prepped in a sterile fashion of sterile water/ saline was instilled into the bladder when the patient felt the urge to urinate. 30ml of water was then drained from the balloon.  A 22FR foley cath was removed from the bladder no complications were noted .  Patient as then given some time to void on their own.  Patient can void  on their own after some time.  Patient tolerated well.  Performed by: Guss Bunde, CMA  Follow up/ Additional notes: Sarah to see after

## 2022-09-14 NOTE — Discharge Summary (Signed)
Physician Discharge Summary  Patient ID: Anthony Kelly MRN: [id number] DOB/AGE: 1945-03-03 77 y.o.  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 04/2013 -adm with CP, elevated CRP. H/o coronary artery calcification on prior CT but nuc was negative, EF 71%.   Arthritis    Atrial fibrillation (HCC)    a. Isolated episode in the setting of acute pericarditis 04/2013. Was not placed on anticoag.   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 male who was admitted 09/09/2022 with a diagnosis of BPH (benign prostatic hyperplasia) and went to the operating room on 09/09/2022 and underwent the above named procedures.    He was given  perioperative antibiotics:  Anti-infectives (From admission, onward)    Start     Dose/Rate Route Frequency Ordered Stop   09/09/22 1830  acyclovir (ZOVIRAX) 200 MG capsule 400 mg  Status:  Discontinued        400 mg Oral Daily 09/09/22 1737 09/10/22 1943   09/09/22 1545  acyclovir (ZOVIRAX) tablet 400 mg  Status:  Discontinued        400 mg Oral Daily 09/09/22 1538 09/09/22 1737   09/09/22 0731  sodium chloride 0.9 % with cefTRIAXone (ROCEPHIN) ADS Med       Note to Pharmacy: Daphane Shepherd H: cabinet override      09/09/22 0731 09/09/22 0955   09/09/22 0731  ceFAZolin (ANCEF) 2-4 GM/100ML-% IVPB       Note to Pharmacy: Daphane Shepherd H: cabinet override      09/09/22 0731 09/09/22 1944   09/09/22 0701  ceFAZolin (ANCEF) IVPB 2g/100 mL premix  Status:  Discontinued        2 g 200 mL/hr over 30 Minutes Intravenous 30 min pre-op 09/09/22 0701 09/09/22 1100   09/09/22 0701  cefTRIAXone (ROCEPHIN) 2 g in sodium chloride 0.9 % 100 mL IVPB        2 g 200 mL/hr over 30 Minutes Intravenous 30 min pre-op 09/09/22 0701 09/10/22 1345     .  He was given sequential compression devices, early ambulation for DVT prophylaxis.  He benefited maximally from the hospital stay and there were no complications.    Recent vital signs:  Vitals:  09/09/22 2355 09/10/22 0324  BP: (!) 99/58 105/67  Pulse: (!) 59 60  Resp:    Temp: 97.7 F (36.5 C) 97.6 F (36.4 C)  SpO2: 92% 95%    Recent laboratory studies:  Lab Results  Component Value Date   HGB 13.2 09/10/2022   HGB 14.3 09/09/2022   HGB 14.7 08/26/2022   Lab Results  Component Value Date   WBC 12.6 (H) 09/10/2022   PLT 164 09/10/2022   Lab Results  Component Value Date   INR 1.1 02/09/2019   Lab Results  Component Value Date   NA 136 09/10/2022   K 4.5 09/10/2022   CL 105 09/10/2022   CO2 24 09/10/2022   BUN 14 09/10/2022   CREATININE 0.82 09/10/2022   GLUCOSE 147 (H) 09/10/2022    Discharge Medications:   Allergies as of  09/10/2022   No Known Allergies      Medication List     STOP taking these medications    silodosin 8 MG Caps capsule Commonly known as: RAPAFLO       TAKE these medications    acetaminophen 500 MG tablet Commonly known as: TYLENOL Take 500-1,000 mg by mouth every 6 (six) hours as needed for moderate pain.   acyclovir 400 MG tablet Commonly known as: ZOVIRAX Take 1 tablet (400 mg total) by mouth daily.   BEET ROOT PO Take 1 Dose by mouth daily.   brimonidine 0.2 % ophthalmic solution Commonly known as: ALPHAGAN Place 1 drop into both eyes 2 (two) times daily.   Calcium 1200 1200-1000 MG-UNIT Chew Chew 1 tablet by mouth daily.   Eliquis 5 MG Tabs tablet Generic drug: apixaban TAKE 1 TABLET TWICE A DAY   finasteride 5 MG tablet Commonly known as: PROSCAR Take 1 tablet (5 mg total) by mouth daily.   lenalidomide 15 MG capsule Commonly known as: Revlimid TAKE 1 CAPSULE DAILY FOR 21 DAYS ON, THEN 7 DAYS OFF, REPEAT EVERY 28 DAYS   Lumigan 0.01 % Soln Generic drug: bimatoprost Place 1 drop into both eyes at bedtime.   Magnesium 250 MG Tabs Take 1 tablet by mouth daily.   metoprolol succinate 25 MG 24 hr tablet Commonly known as: TOPROL-XL Take 0.5 tablets (12.5 mg total) by mouth daily. What changed: when to take this   multivitamin with minerals Tabs tablet Take 1 tablet by mouth daily.   omeprazole 20 MG tablet Commonly known as: PRILOSEC OTC Take 20 mg by mouth every other day.   polyethylene glycol 17 g packet Commonly known as: MIRALAX / GLYCOLAX Take 8.5 g by mouth daily.   rosuvastatin 20 MG tablet Commonly known as: CRESTOR Take 1 tablet (20 mg total) by mouth daily.   SYSTANE OP Place 1 drop into both eyes daily as needed (dry eyes).   traMADol 50 MG tablet Commonly known as: Ultram Take 1 tablet (50 mg total) by mouth every 6 (six) hours as needed.   vitamin C 1000 MG tablet Take 1,000 mg by mouth daily.   Xiidra 5 %  Soln Generic drug: Lifitegrast Place 1 drop into both eyes 2 (two) times daily.        Diagnostic Studies: LONG TERM MONITOR (3-14 DAYS)  Result Date: 08/22/2022 Wear time: 7 days Sinus rhythm HR 48-132 bpm, avg 66 There were occasional episodes of SVT -- longest 1.8s Overall burden of supraventricular ectopy: about 8% Patient triggered events correlated with frequent PACs    Disposition: Discharge disposition: 01-Home or Self Care  Discharge Instructions     Discharge patient   Complete by: As directed    Discharge disposition: 01-Home or Self Care   Discharge patient date: 09/10/2022        Follow-up Information     Malen Gauze, MD. Call in 1 week(s).   Specialty: Urology Contact information: [address]  [place] [address] [phone]                  Signed: Wilkie Aye 09/14/2022, 8:52 AM

## 2022-09-16 ENCOUNTER — Institutional Professional Consult (permissible substitution): Payer: Medicare Other | Admitting: Pulmonary Disease

## 2022-09-16 LAB — SURGICAL PATHOLOGY

## 2022-09-17 ENCOUNTER — Other Ambulatory Visit: Payer: Self-pay | Admitting: Hematology and Oncology

## 2022-09-17 DIAGNOSIS — C8280 Other types of follicular lymphoma, unspecified site: Secondary | ICD-10-CM

## 2022-09-20 ENCOUNTER — Encounter: Payer: Self-pay | Admitting: Hematology and Oncology

## 2022-09-20 ENCOUNTER — Other Ambulatory Visit: Payer: Self-pay

## 2022-09-20 MED ORDER — LENALIDOMIDE 15 MG PO CAPS: ORAL_CAPSULE | ORAL | 0 refills | Status: DC

## 2022-09-21 ENCOUNTER — Encounter: Payer: Self-pay | Admitting: Urology

## 2022-09-21 ENCOUNTER — Other Ambulatory Visit: Payer: Self-pay | Admitting: Hematology and Oncology

## 2022-09-21 NOTE — Telephone Encounter (Signed)
[place]

## 2022-10-04 ENCOUNTER — Encounter: Payer: Self-pay | Admitting: Hematology and Oncology

## 2022-10-04 ENCOUNTER — Encounter: Payer: Self-pay | Admitting: Family Medicine

## 2022-10-04 ENCOUNTER — Ambulatory Visit (INDEPENDENT_AMBULATORY_CARE_PROVIDER_SITE_OTHER): Payer: Medicare Other | Admitting: Family Medicine

## 2022-10-04 VITALS — BP 106/66 | HR 90 | Temp 98.7°F | Ht 72.0 in | Wt 182.0 lb

## 2022-10-04 DIAGNOSIS — R7303 Prediabetes: Secondary | ICD-10-CM | POA: Diagnosis not present

## 2022-10-04 DIAGNOSIS — I480 Paroxysmal atrial fibrillation: Secondary | ICD-10-CM

## 2022-10-04 DIAGNOSIS — M5136 Other intervertebral disc degeneration, lumbar region: Secondary | ICD-10-CM | POA: Diagnosis not present

## 2022-10-04 DIAGNOSIS — E782 Mixed hyperlipidemia: Secondary | ICD-10-CM | POA: Diagnosis not present

## 2022-10-04 LAB — BAYER DCA HB A1C WAIVED: HB A1C (BAYER DCA - WAIVED): 6.5 % — ABNORMAL HIGH (ref 4.8–5.6)

## 2022-10-04 MED ORDER — TRAMADOL HCL 50 MG PO TABS
50.0000 mg | ORAL_TABLET | Freq: Two times a day (BID) | ORAL | 0 refills | Status: DC | PRN
Start: 2022-10-04 — End: 2023-03-31

## 2022-10-04 NOTE — Progress Notes (Signed)
BP 106/66   Pulse 90   Temp 98.7 F (37.1 C)   Ht 6' (1.829 m)   Wt 182 lb (82.6 kg)   SpO2 98%   BMI 24.68 kg/m    Subjective:   Patient ID: Anthony Kelly, male    DOB: 1945/09/24, 77 y.o.   MRN: [id number]  HPI: Anthony Kelly is a 77 y.o. male presenting on 10/04/2022 for Medical Management of Chronic Issues (6 month)   HPI Hyperlipidemia Patient is coming in for recheck of his hyperlipidemia. The patient is currently taking Crestor. They deny any issues with myalgias or history of liver damage from it. They deny any focal numbness or weakness or chest pain.   Prediabetes Patient comes in today for recheck of his diabetes. Patient has been currently taking no medicine, diet control. Patient is not currently on an ACE inhibitor/ARB. Patient has not seen an ophthalmologist this year. Patient denies any new issues with their feet. The symptom started onset as an adult hyperlipidemia and A-fib ARE RELATED TO DM   A-fib Patient is coming in today for paroxysmal A-fib.  He is currently taking Eliquis and sees cardiology and also taking the Toprol  Relevant past medical, surgical, family and social history reviewed and updated as indicated. Interim medical history since our last visit reviewed. Allergies and medications reviewed and updated.  Review of Systems  Constitutional:  Negative for chills and fever.  Eyes:  Negative for visual disturbance.  Respiratory:  Negative for shortness of breath and wheezing.   Cardiovascular:  Negative for chest pain and leg swelling.  Musculoskeletal:  Positive for arthralgias and back pain. Negative for gait problem.  Skin:  Negative for rash.  Neurological:  Negative for dizziness, weakness and light-headedness.  All other systems reviewed and are negative.   Per HPI unless specifically indicated above   Allergies as of 10/04/2022   No Known Allergies      Medication List        Accurate as of October 04, 2022 10:51 AM. If you  have any questions, ask your nurse or doctor.          acetaminophen 500 MG tablet Commonly known as: TYLENOL Take 500-1,000 mg by mouth every 6 (six) hours as needed for moderate pain.   acyclovir 400 MG tablet Commonly known as: ZOVIRAX TAKE 1 TABLET(400 MG) BY MOUTH DAILY   BEET ROOT PO Take 1 Dose by mouth daily.   brimonidine 0.2 % ophthalmic solution Commonly known as: ALPHAGAN Place 1 drop into both eyes 2 (two) times daily.   Calcium 1200 1200-1000 MG-UNIT Chew Chew 1 tablet by mouth daily.   Eliquis 5 MG Tabs tablet Generic drug: apixaban TAKE 1 TABLET TWICE A DAY   finasteride 5 MG tablet Commonly known as: PROSCAR Take 1 tablet (5 mg total) by mouth daily.   lenalidomide 15 MG capsule Commonly known as: Revlimid TAKE 1 CAPSULE DAILY FOR 21 DAYS ON, THEN 7 DAYS OFF, REPEAT EVERY 28 DAYS  Authorization [id number], obtained on 09/20/2022   Lumigan 0.01 % Soln Generic drug: bimatoprost Place 1 drop into both eyes at bedtime.   Magnesium 250 MG Tabs Take 1 tablet by mouth daily.   metoprolol succinate 25 MG 24 hr tablet Commonly known as: TOPROL-XL Take 0.5 tablets (12.5 mg total) by mouth daily. What changed: when to take this   multivitamin with minerals Tabs tablet Take 1 tablet by mouth daily.   omeprazole 20 MG tablet Commonly known as: PRILOSEC  OTC Take 20 mg by mouth every other day.   polyethylene glycol 17 g packet Commonly known as: MIRALAX / GLYCOLAX Take 8.5 g by mouth daily.   rosuvastatin 20 MG tablet Commonly known as: CRESTOR Take 1 tablet (20 mg total) by mouth daily.   SYSTANE OP Place 1 drop into both eyes daily as needed (dry eyes).   traMADol 50 MG tablet Commonly known as: Ultram Take 1 tablet (50 mg total) by mouth every 12 (twelve) hours as needed. What changed: when to take this Changed by: Elige Radon Xavious Sharrar   vitamin C 1000 MG tablet Take 1,000 mg by mouth daily.   Xiidra 5 % Soln Generic drug:  Lifitegrast Place 1 drop into both eyes 2 (two) times daily.         Objective:   BP 106/66   Pulse 90   Temp 98.7 F (37.1 C)   Ht 6' (1.829 m)   Wt 182 lb (82.6 kg)   SpO2 98%   BMI 24.68 kg/m   Wt Readings from Last 3 Encounters:  10/04/22 182 lb (82.6 kg)  09/09/22 181 lb 12.8 oz (82.5 kg)  08/26/22 189 lb 6.4 oz (85.9 kg)    Physical Exam Vitals and nursing note reviewed.  Constitutional:      General: He is not in acute distress.    Appearance: He is well-developed. He is not diaphoretic.  Eyes:     General: No scleral icterus.    Conjunctiva/sclera: Conjunctivae normal.  Neck:     Thyroid: No thyromegaly.  Cardiovascular:     Rate and Rhythm: Normal rate and regular rhythm.     Heart sounds: Normal heart sounds. No murmur heard. Pulmonary:     Effort: Pulmonary effort is normal. No respiratory distress.     Breath sounds: Normal breath sounds. No wheezing.  Musculoskeletal:        General: No swelling. Normal range of motion.     Cervical back: Neck supple.  Lymphadenopathy:     Cervical: No cervical adenopathy.  Skin:    General: Skin is warm and dry.     Findings: No rash.  Neurological:     Mental Status: He is alert and oriented to person, place, and time.     Coordination: Coordination normal.  Psychiatric:        Behavior: Behavior normal.       Assessment & Plan:   Problem List Items Addressed This Visit       Cardiovascular and Mediastinum   PAF (paroxysmal atrial fibrillation) (HCC) (Chronic)   Relevant Orders   CBC with Differential/Platelet     Musculoskeletal and Integument   DDD (degenerative disc disease), lumbar   Relevant Medications   traMADol (ULTRAM) 50 MG tablet     Other   Hyperlipidemia - Primary   Relevant Orders   CBC with Differential/Platelet   CMP14+EGFR   Lipid panel   Prediabetes   Relevant Orders   Bayer DCA Hb A1c Waived    Will give him a refill of tramadol that he can use as needed but also  recommended that he consider seeing a back doctor to be evaluated, it sounds like he is having a nerve impingement with some tingling and numbness on the outside of his right upper leg.  Will do some blood work today and check his A1c again. Follow up plan: Return in about 6 months (around 04/03/2023), or if symptoms worsen or fail to improve, for Hyperlipidemia and prediabetes recheck.  Counseling provided for all of the vaccine components Orders Placed This Encounter  Procedures   Bayer DCA Hb A1c Waived   CBC with Differential/Platelet   CMP14+EGFR   Lipid panel    Arville Care, MD [place] [facility] 10/04/2022, 10:51 AM

## 2022-10-05 ENCOUNTER — Other Ambulatory Visit: Payer: Self-pay

## 2022-10-05 ENCOUNTER — Encounter: Payer: Self-pay | Admitting: Urology

## 2022-10-05 ENCOUNTER — Encounter: Payer: Self-pay | Admitting: Hematology and Oncology

## 2022-10-05 LAB — CMP14+EGFR
ALT: 19 IU/L (ref 0–44)
AST: 13 IU/L (ref 0–40)
Albumin: 4.3 g/dL (ref 3.8–4.8)
Alkaline Phosphatase: 73 IU/L (ref 44–121)
BUN/Creatinine Ratio: 18 (ref 10–24)
BUN: 18 mg/dL (ref 8–27)
Bilirubin Total: 0.6 mg/dL (ref 0.0–1.2)
CO2: 26 mmol/L (ref 20–29)
Calcium: 9.1 mg/dL (ref 8.6–10.2)
Chloride: 101 mmol/L (ref 96–106)
Creatinine, Ser: 1.02 mg/dL (ref 0.76–1.27)
Globulin, Total: 1.8 g/dL (ref 1.5–4.5)
Glucose: 127 mg/dL — ABNORMAL HIGH (ref 70–99)
Potassium: 4.9 mmol/L (ref 3.5–5.2)
Sodium: 141 mmol/L (ref 134–144)
Total Protein: 6.1 g/dL (ref 6.0–8.5)
eGFR: 76 mL/min/{1.73_m2} (ref >59)

## 2022-10-05 LAB — LIPID PANEL
Chol/HDL Ratio: 2.1 ratio (ref 0.0–5.0)
Cholesterol, Total: 102 mg/dL (ref 100–199)
HDL: 48 mg/dL (ref >39)
LDL Chol Calc (NIH): 34 mg/dL (ref 0–99)
Triglycerides: 111 mg/dL (ref 0–149)
VLDL Cholesterol Cal: 20 mg/dL (ref 5–40)

## 2022-10-05 LAB — CBC WITH DIFFERENTIAL/PLATELET
Basophils Absolute: 0.1 10*3/uL (ref 0.0–0.2)
Basos: 1 %
EOS (ABSOLUTE): 0.5 10*3/uL — ABNORMAL HIGH (ref 0.0–0.4)
Eos: 8 %
Hematocrit: 46.2 % (ref 37.5–51.0)
Hemoglobin: 14.7 g/dL (ref 13.0–17.7)
Immature Grans (Abs): 0 10*3/uL (ref 0.0–0.1)
Immature Granulocytes: 0 %
Lymphocytes Absolute: 1.1 10*3/uL (ref 0.7–3.1)
Lymphs: 20 %
MCH: 29.1 pg (ref 26.6–33.0)
MCHC: 31.8 g/dL (ref 31.5–35.7)
MCV: 91 fL (ref 79–97)
Monocytes Absolute: 0.9 10*3/uL (ref 0.1–0.9)
Monocytes: 17 %
Neutrophils Absolute: 3 10*3/uL (ref 1.4–7.0)
Neutrophils: 54 %
Platelets: 196 10*3/uL (ref 150–450)
RBC: 5.06 x10E6/uL (ref 4.14–5.80)
RDW: 14.4 % (ref 11.6–15.4)
WBC: 5.6 10*3/uL (ref 3.4–10.8)

## 2022-10-20 ENCOUNTER — Encounter: Payer: Self-pay | Admitting: Hematology and Oncology

## 2022-10-21 ENCOUNTER — Encounter: Payer: Self-pay | Admitting: Hematology and Oncology

## 2022-10-21 ENCOUNTER — Inpatient Hospital Stay: Payer: Medicare Other | Admitting: Physician Assistant

## 2022-10-21 ENCOUNTER — Other Ambulatory Visit: Payer: Self-pay | Admitting: Hematology and Oncology

## 2022-10-21 ENCOUNTER — Inpatient Hospital Stay: Payer: Medicare Other

## 2022-10-21 ENCOUNTER — Inpatient Hospital Stay: Payer: Medicare Other | Attending: Hematology and Oncology | Admitting: Hematology and Oncology

## 2022-10-21 VITALS — BP 99/67 | HR 65 | Resp 16

## 2022-10-21 VITALS — BP 104/59 | HR 54 | Resp 16

## 2022-10-21 DIAGNOSIS — C8280 Other types of follicular lymphoma, unspecified site: Secondary | ICD-10-CM

## 2022-10-21 DIAGNOSIS — D696 Thrombocytopenia, unspecified: Secondary | ICD-10-CM | POA: Diagnosis not present

## 2022-10-21 DIAGNOSIS — C8288 Other types of follicular lymphoma, lymph nodes of multiple sites: Secondary | ICD-10-CM | POA: Diagnosis not present

## 2022-10-21 DIAGNOSIS — N400 Enlarged prostate without lower urinary tract symptoms: Secondary | ICD-10-CM | POA: Diagnosis not present

## 2022-10-21 DIAGNOSIS — Z5112 Encounter for antineoplastic immunotherapy: Secondary | ICD-10-CM | POA: Diagnosis not present

## 2022-10-21 LAB — CBC WITH DIFFERENTIAL/PLATELET
Abs Immature Granulocytes: 0.03 10*3/uL (ref 0.00–0.07)
Basophils Absolute: 0.1 10*3/uL (ref 0.0–0.1)
Basophils Relative: 1 %
Eosinophils Absolute: 0.3 10*3/uL (ref 0.0–0.5)
Eosinophils Relative: 4 %
HCT: 45.1 % (ref 39.0–52.0)
Hemoglobin: 15.1 g/dL (ref 13.0–17.0)
Immature Granulocytes: 0 %
Lymphocytes Relative: 15 %
Lymphs Abs: 1.1 10*3/uL (ref 0.7–4.0)
MCH: 29.4 pg (ref 26.0–34.0)
MCHC: 33.5 g/dL (ref 30.0–36.0)
MCV: 87.9 fL (ref 80.0–100.0)
Monocytes Absolute: 0.6 10*3/uL (ref 0.1–1.0)
Monocytes Relative: 8 %
Neutro Abs: 4.9 10*3/uL (ref 1.7–7.7)
Neutrophils Relative %: 72 %
Platelets: 147 10*3/uL — ABNORMAL LOW (ref 150–400)
RBC: 5.13 MIL/uL (ref 4.22–5.81)
RDW: 14.2 % (ref 11.5–15.5)
WBC: 6.9 10*3/uL (ref 4.0–10.5)
nRBC: 0 % (ref 0.0–0.2)

## 2022-10-21 LAB — COMPREHENSIVE METABOLIC PANEL
ALT: 13 U/L (ref 0–44)
AST: 13 U/L — ABNORMAL LOW (ref 15–41)
Albumin: 4 g/dL (ref 3.5–5.0)
Alkaline Phosphatase: 49 U/L (ref 38–126)
Anion gap: 8 (ref 5–15)
BUN: 18 mg/dL (ref 8–23)
CO2: 27 mmol/L (ref 22–32)
Calcium: 9 mg/dL (ref 8.9–10.3)
Chloride: 104 mmol/L (ref 98–111)
Creatinine, Ser: 1.16 mg/dL (ref 0.61–1.24)
GFR, Estimated: >60 mL/min (ref >60)
Glucose, Bld: 134 mg/dL — ABNORMAL HIGH (ref 70–99)
Potassium: 3.7 mmol/L (ref 3.5–5.1)
Sodium: 139 mmol/L (ref 135–145)
Total Bilirubin: 1 mg/dL (ref 0.3–1.2)
Total Protein: 6.1 g/dL — ABNORMAL LOW (ref 6.5–8.1)

## 2022-10-21 MED ORDER — DIPHENHYDRAMINE HCL 25 MG PO CAPS
25.0000 mg | ORAL_CAPSULE | Freq: Once | ORAL | Status: AC
  Administered 2022-10-21: 25 mg via ORAL
  Filled 2022-10-21: qty 1

## 2022-10-21 MED ORDER — FAMOTIDINE 20 MG PO TABS
20.0000 mg | ORAL_TABLET | Freq: Once | ORAL | Status: AC
  Administered 2022-10-21: 20 mg via ORAL
  Filled 2022-10-21: qty 1

## 2022-10-21 MED ORDER — SODIUM CHLORIDE 0.9 % IV SOLN
375.0000 mg/m2 | Freq: Once | INTRAVENOUS | Status: AC
  Administered 2022-10-21: 800 mg via INTRAVENOUS
  Filled 2022-10-21: qty 50

## 2022-10-21 MED ORDER — FAMOTIDINE IN NACL 20-0.9 MG/50ML-% IV SOLN
20.0000 mg | Freq: Once | INTRAVENOUS | Status: AC | PRN
  Administered 2022-10-21: 20 mg via INTRAVENOUS

## 2022-10-21 MED ORDER — ACETAMINOPHEN 325 MG PO TABS
650.0000 mg | ORAL_TABLET | Freq: Once | ORAL | Status: AC
  Administered 2022-10-21: 650 mg via ORAL
  Filled 2022-10-21: qty 2

## 2022-10-21 MED ORDER — SODIUM CHLORIDE 0.9 % IV SOLN: Freq: Once | INTRAVENOUS | Status: AC

## 2022-10-21 MED ORDER — METHYLPREDNISOLONE SODIUM SUCC 125 MG IJ SOLR
125.0000 mg | Freq: Once | INTRAMUSCULAR | Status: AC | PRN
  Administered 2022-10-21: 125 mg via INTRAVENOUS

## 2022-10-21 MED ORDER — SODIUM CHLORIDE 0.9 % IV SOLN: Freq: Once | INTRAVENOUS | Status: DC | PRN

## 2022-10-21 NOTE — Patient Instructions (Signed)
[facility]  Discharge Instructions: Thank you for choosing [facility] to provide your oncology and hematology care.   If you have a lab appointment with the Cancer Center, please go directly to the Cancer Center and check in at the registration area.   Wear comfortable clothing and clothing appropriate for easy access to any Portacath or PICC line.   We strive to give you quality time with your provider. You may need to reschedule your appointment if you arrive late (15 or more minutes).  Arriving late affects you and other patients whose appointments are after yours.  Also, if you miss three or more appointments without notifying the office, you may be dismissed from the clinic at the provider's discretion.      For prescription refill requests, have your pharmacy contact our office and allow 72 hours for refills to be completed.    Today you received the following chemotherapy and/or immunotherapy agents: rituximab      To help prevent nausea and vomiting after your treatment, we encourage you to take your nausea medication as directed.  BELOW ARE SYMPTOMS THAT SHOULD BE REPORTED IMMEDIATELY: *FEVER GREATER THAN 100.4 F (38 C) OR HIGHER *CHILLS OR SWEATING *NAUSEA AND VOMITING THAT IS NOT CONTROLLED WITH YOUR NAUSEA MEDICATION *UNUSUAL SHORTNESS OF BREATH *UNUSUAL BRUISING OR BLEEDING *URINARY PROBLEMS (pain or burning when urinating, or frequent urination) *BOWEL PROBLEMS (unusual diarrhea, constipation, pain near the anus) TENDERNESS IN MOUTH AND THROAT WITH OR WITHOUT PRESENCE OF ULCERS (sore throat, sores in mouth, or a toothache) UNUSUAL RASH, SWELLING OR PAIN  UNUSUAL VAGINAL DISCHARGE OR ITCHING   Items with * indicate a potential emergency and should be followed up as soon as possible or go to the Emergency Department if any problems should occur.  Please show the CHEMOTHERAPY ALERT CARD or IMMUNOTHERAPY ALERT CARD at  check-in to the Emergency Department and triage nurse.  Should you have questions after your visit or need to cancel or reschedule your appointment, please contact [facility]  Dept: [phone]  and follow the prompts.  Office hours are 8:00 a.m. to 4:30 p.m. Monday - Friday. Please note that voicemails left after 4:00 p.m. may not be returned until the following business day.  We are closed weekends and major holidays. You have access to a nurse at all times for urgent questions. Please call the main number to the clinic Dept: [phone] and follow the prompts.   For any non-urgent questions, you may also contact your provider using MyChart. We now offer e-Visits for anyone 18 and older to request care online for non-urgent symptoms. For details visit [email or web address].   Also download the MyChart app! Go to the app store, search "MyChart", open the app, select [facility], and log in with your MyChart username and password.   

## 2022-10-21 NOTE — Progress Notes (Signed)
[facility] OFFICE PROGRESS NOTE  Patient Care Team: Dettinger, Elige Radon, MD as PCP - General (Family Medicine) Wyline Mood, Dorothe Pea, MD as PCP - Cardiology (Cardiology) Mealor, Roberts Gaudy, MD as PCP - Electrophysiology (Cardiology) Iva Boop, MD as Consulting Physician (Gastroenterology) Artis Delay, MD as Consulting Physician (Hematology and Oncology) Cherlyn Roberts, MD as Consulting Physician (Dermatology)  HISTORY OF PRESENTING ILLNESS: Discussed the use of AI scribe software for clinical note transcription with the patient, who gave verbal consent to proceed.  History of Present Illness   The patient, post-prostate surgery, reports a significant improvement in urinary flow without any new side effects or complications such as infection, skin rashes, or diarrhea. He has been on a treatment regimen of Rituximab, which is administered every sixty days. The patient's blood count is satisfactory, although there has been a slight decrease in platelet count, which is just below the normal range. This could potentially be a side effect of the Revlimid treatment.  The patient's last CT scan was in June, which was largely normal except for a lymph node measuring less than one centimeter.  The patient has not reported any issues with his prescription medications, including obtaining his Revlimid. He has not requested any new prescriptions.         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 Stable lymph node less than 1 cm on last CT scan in June. -Continue current treatment regimen with Revlimid. -Schedule follow-up appointment on 12/21/2022. -Order CT scan for 12/14/2022 to review before next appointment.    BPH Post-prostate surgery with improved urinary flow.    Thrombocytopenia Mildly low platelet count (147, normal is 150), possibly due to Revlimid. No significant change from previous counts. -Monitor platelet count,  no change in treatment necessary at this time.  General Health Maintenance -No new prescriptions needed. -Ensure continued access to Revlimid.          Orders Placed This Encounter  Procedures   CT CHEST ABDOMEN PELVIS W CONTRAST    Standing Status:   Future    Standing Expiration Date:   10/21/2023    Scheduling Instructions:     No need oral contrast    Order Specific Question:   If indicated for the ordered procedure, I authorize the administration of contrast media per Radiology protocol    Answer:   Yes    Order Specific Question:   Does the patient have a contrast media/X-ray dye allergy?    Answer:   No    Order Specific Question:   Preferred imaging location?    Answer:   [facility]    Order Specific Question:   If indicated for the ordered procedure, I authorize the administration of oral contrast media per Radiology protocol    Answer:   Yes    All questions were answered. The patient knows to call the clinic with any problems, questions or concerns. The total time spent in the appointment was 30 minutes encounter with patients including review of chart and various tests results, discussions about plan of care and coordination of care plan   Artis Delay, MD 10/21/2022 1:12 PM  REVIEW OF SYSTEMS:   Constitutional: Denies fevers, chills or abnormal weight loss Eyes: Denies blurriness of vision Ears, nose, mouth, throat, and face: Denies mucositis or sore throat Respiratory: Denies cough, dyspnea or wheezes Cardiovascular: Denies palpitation, chest discomfort or lower extremity swelling Gastrointestinal:  Denies nausea, heartburn or change in  bowel habits Skin: Denies abnormal skin rashes Lymphatics: Denies new lymphadenopathy or easy bruising Neurological:Denies numbness, tingling or new weaknesses Behavioral/Psych: Mood is stable, no new changes  All other systems were reviewed with the patient and are negative.  I have reviewed the past medical  history, past surgical history, social history and family history with the patient and they are unchanged from previous note.  ALLERGIES:  is allergic to rituximab.  MEDICATIONS:  Current Outpatient Medications  Medication Sig Dispense Refill   acetaminophen (TYLENOL) 500 MG tablet Take 500-1,000 mg by mouth every 6 (six) hours as needed for moderate pain.     acyclovir (ZOVIRAX) 400 MG tablet TAKE 1 TABLET(400 MG) BY MOUTH DAILY 90 tablet 1   apixaban (ELIQUIS) 5 MG TABS tablet TAKE 1 TABLET TWICE A DAY 180 tablet 2   Ascorbic Acid (VITAMIN C) 1000 MG tablet Take 1,000 mg by mouth daily.     brimonidine (ALPHAGAN) 0.2 % ophthalmic solution Place 1 drop into both eyes 2 (two) times daily.      Calcium Carbonate-Vit D-Min (CALCIUM 1200) 1200-1000 MG-UNIT CHEW Chew 1 tablet by mouth daily.     finasteride (PROSCAR) 5 MG tablet Take 1 tablet (5 mg total) by mouth daily. 90 tablet 3   lenalidomide (REVLIMID) 15 MG capsule TAKE 1 CAPSULE DAILY FOR 21 DAYS ON, THEN 7 DAYS OFF, REPEAT EVERY 28 DAYS 21 capsule 0   Lifitegrast (XIIDRA) 5 % SOLN Place 1 drop into both eyes 2 (two) times daily.     LUMIGAN 0.01 % SOLN Place 1 drop into both eyes at bedtime.     Magnesium 250 MG TABS Take 1 tablet by mouth daily.     metoprolol succinate (TOPROL-XL) 25 MG 24 hr tablet Take 0.5 tablets (12.5 mg total) by mouth daily. (Patient taking differently: Take 12.5 mg by mouth 2 (two) times daily.) 90 tablet 3   Misc Natural Products (BEET ROOT PO) Take 1 Dose by mouth daily.     Multiple Vitamin (MULTIVITAMIN WITH MINERALS) TABS tablet Take 1 tablet by mouth daily.      omeprazole (PRILOSEC OTC) 20 MG tablet Take 20 mg by mouth every other day.     Polyethyl Glycol-Propyl Glycol (SYSTANE OP) Place 1 drop into both eyes daily as needed (dry eyes).     polyethylene glycol (MIRALAX / GLYCOLAX) 17 g packet Take 8.5 g by mouth daily.     rosuvastatin (CRESTOR) 20 MG tablet Take 1 tablet (20 mg total) by mouth daily. 90  tablet 3   traMADol (ULTRAM) 50 MG tablet Take 1 tablet (50 mg total) by mouth every 12 (twelve) hours as needed. 30 tablet 0   No current facility-administered medications for this visit.   Facility-Administered Medications Ordered in Other Visits  Medication Dose Route Frequency Provider Last Rate Last Admin   0.9 %  sodium chloride infusion   Intravenous Once PRN Artis Delay, MD   Stopped at 10/21/22 1217    SUMMARY OF ONCOLOGIC HISTORY: Oncology History Overview Note  Low grade B-cell lymphoma   Primary site: Lymphoid Neoplasms   Staging method: AJCC 6th Edition   Clinical: Stage IV signed by Artis Delay, MD on 09/26/2013  9:15 AM   Summary: Stage IV      Follicular low grade B-cell lymphoma (HCC)  12/10/2003 Surgery   Inguinal lymph node biopsy showed follicular lymphoma.   12/12/2003 - 06/01/2004 Chemotherapy   He was treated with R. CHOP chemotherapy which show complete remission. The  number of cycles of R. CHOP chemotherapy was unknown.   12/19/2006 Surgery   Lung resection show follicular lymphoma.   01/02/2007 - 09/01/2008 Chemotherapy   The patient was treated with single agent rituximab alone.   01/19/2007 Bone Marrow Biopsy   Bone marrow biopsy was negative.   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   05/08/2013 - 05/11/2013 Hospital Admission   The patient was admitted to the hospital for management of pericarditis. CT scan showed extensive lymphadenopathy.   06/07/2013 Imaging   PET/CT scan showed extensive lymphadenopathy   06/25/2013 Procedure   He has placement of Infuse-a-Port.   06/28/2013 Bone Marrow Biopsy   Bone marrow biopsy is positive for lymphoma involvement.   07/04/2013 - 11/22/2013 Chemotherapy   He is treated with 6 cycles of bendamustine with rituximab.   09/24/2013 Imaging   Repeat PET scan show complete remission.   12/26/2013 Imaging   PEt scan showed complete remission   09/26/2014 Imaging   CT  scan of the chest abdomen and pelvis show no evidence of disease   03/26/2015 Imaging   CT scan showed no evidence of lymphoma   04/14/2016 Imaging   CT: Borderline prominent right hilar and subcarinal lymph nodes, but not appreciably changed. 2. Low-grade but increased central mesenteric stranding with some small mesenteric lymph nodes. This could certainly be inflammatory, 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, much of which is likely a physiologic response to cholecystectomy. 7. Sigmoid colon diverticulosis. 8.  Prominent stool throughout the colon favors constipation. 9. Enlarged prostate gland, volume estimated at 75 cubic cm.   02/15/2017 Imaging   No evidence of recurrent lymphoma or other acute findings.  Stable moderate hiatal hernia.  Colonic diverticulosis, without radiographic evidence of diverticulitis.  Stable mildly enlarged prostate.  Mild emphysema.  Aortic and coronary artery atherosclerosis.   01/31/2019 Imaging   1. Interval development of abdominal and pelvic adenopathy compatible with recurrent lymphoma. 2. Emphysema and aortic atherosclerosis. 3. Multi vessel coronary artery calcifications. 4. Moderate to large hiatal hernia   02/09/2019 Procedure   Successful CT-guided core biopsy of the left retroperitoneal periaortic adenopathy   02/09/2019 Pathology Results   SURGICAL PATHOLOGY  CASE: [id number]  PATIENT: Anthony Kelly  Surgical Pathology Report   Clinical History: Lymphoma; [name], LEFT PARA AORTIC, NEEDLE CORE BIOPSY:  -  Follicular lymphoma  -  See comment    COMMENT:   The biopsy consists of four fragmented lymph node cores with a vaguely nodular proliferation pattern.  The lymphoid population is composed of small to medium lymphocytes with irregular, cleaved nuclei and scant cytoplasm.  By immunohistochemistry, [name], [name], [name], [name] is low (less than 10%).  Flow cytometry was attempted; however, there was insufficient material for analysis (see [id number]).  Overall, the features are consistent with relapse of the patient's previously diagnosed follicular lymphoma.  Based on the biopsy, this is favored to be a low-grade follicular lymphoma  02/19/2019 -  Chemotherapy   The patient had idelisib for chemotherapy treatment.     05/17/2019 Imaging   1. Interval generalized decrease in abdominopelvic lymphadenopathy identified on the previous study. No new or progressive lymphadenopathy on today's study. 2. Moderate hiatal hernia. 3.  Emphysema (ICD10-J43.9) and Aortic Atherosclerosis (ICD10-170.0)   11/16/2019 Imaging   IMPRESSION: Chest Impression:   1. No mediastinal lymphadenopathy. 2. Post RIGHT lung wedge resection without evidence local recurrence.   Abdomen / Pelvis Impression:   1. Stable numerous small periaortic retroperitoneal nodes and common iliac nodes. No progression of adenopathy. 2. No splenomegaly.  No skeletal metastasis.     07/31/2020 Imaging   1. Stable examination. No significant interval change in the prominent/mildly enlarged lymph nodes below the diaphragm. No thoracic adenopathy. No splenomegaly. 2. Postsurgical change of right lung wedge resection without evidence of local recurrence. 3. Large hiatal hernia. 4. Colonic diverticulosis without findings of acute diverticulitis. 5. Aortic  Atherosclerosis (ICD10-I70.0) and Emphysema (ICD10-J43.9).     01/29/2021 Imaging   1. Overall mild interval enlargement of lymph nodes in the abdomen and pelvis as described. 2. No lymphadenopathy identified in the chest. 3. Emphysematous changes of the lungs. 4. Large hiatal hernia.  Extensive colonic diverticulosis. 5. Other ancillary findings as described.     04/30/2021 Imaging   Chest Impression:   1. No mediastinal lymphadenopathy. 2. Large hiatal hernia. 3. Retrocrural node slightly decreased in size.   Abdomen / Pelvis Impression:   1. Cluster of numerous small retroperitoneal periaortic lymph nodes. Lymph nodes are decreased slightly in size compared to recent CT scan. 2. Likewise reduction in LEFT external iliac small lymph node. 3. No evidence of progression of lymphoma. 4. Normal spleen. 5. No bone involvement.   10/28/2021 Imaging   1. Overall trend towards progressive disease on today's exam. Lymph nodes in the hepatoduodenal ligament are upper normal for size but stable in the interval. Index retroperitoneal lymph nodes are progressive, notably in the right common iliac chain. Soft tissue nodule/lymph node in the retroperitoneal fat adjacent to the right iliacus muscle is progressive. 2. Large hiatal hernia. 3. Colonic diverticulosis without diverticulitis. 4. Prostatomegaly. 5. Aortic Atherosclerosis (ICD10-I70.0) and Emphysema (ICD10-J43.9).   03/10/2022 PET scan   Multiple intensely avid and enlarged retroperitoneal lymph nodes consistent with patient's known history of lymphoma. Of note, some of these lymph nodes demonstrate interval growth when compared to most recent abdominal CT, for reference to the node adjacent to the right psoas musculature. For purposes of assessing transformation into a more aggressive form of lymphoma, SUV max calculated using total body weight is 10.5 for the largest right iliac node and 9.8 for the soft tissue implant posterior to the  right psoas muscle.  - Similar focus of increased hypermetabolic to be within the right hilar region, which may represent mediastinal involvement versus reactive etiology. Attention on follow-up examinations.    03/24/2022 Procedure   PROCEDURE: The patient was placed in the prone position. Initial CT images of the pelvis were obtained, demonstrating enlarged pelvic lymph node.   An appropriate entry site was identified and marked on the skin. The right back was prepped and draped using all elements of maximal sterile barrier technique.  Local anesthesia was achieved with lidocaine.   Under intermittent CT guidance, a 18-G coaxial needle was inserted into right pelvic lymph node.  A total of 3 cores were obtained through the cannula using spring-loaded 18-G Corvocet biopsy device.    03/24/2022 Pathology Results  A: Lymph node, pelvis, right, biopsy -  Involved by follicular lymphoma (see Comment)   Special Stains: CD5, [name] and CD20 are performed by immunohistochemistry and/or in situ hybridization in addition to flow cytometry for histopathologic and immunophenotypic correlation.   Appropriate controls for each stain have been evaluated and stain as expected.   Sections of block A1 are stained.   CD3: CD3 stain is negative in neoplastic lymphocytes. CD20: CD20 stains neoplastic lymphocytes. CD21: CD21 stains follicular dendritic cells of enlarge neoplastic follicles. CD5: CD5 mirrors CD3 [name]: [name] stains weakly neoplastic lymphocytes. Bcl-6: Bcl-6 stains neoplastic lymphocytes. Bcl-2: Bcl-2 stains neoplastic lymphocytes.  Cyclin-D1: Cyclin-D1 stains is negative in neoplastic lymphocytes. Ki-67: Ki-67 shows low proliferation rate in neoplastic lymphocytes (10%). GMS: GMS stain is negative for microorganisms AFB: AFB stain is negative for microorganisms   04/08/2022 - 08/26/2022 Chemotherapy   Revlimid was started on 4/4 Patient is on Treatment Plan : LYMPHOMA Lenalidomide +  Rituximab q28d      07/01/2022 Imaging   CT CHEST ABDOMEN PELVIS W CONTRAST  Result Date: 07/01/2022 CLINICAL DATA:  Hematologic malignancy, assess treatment response follicular B-cell lymphoma, ongoing chemotherapy * Tracking Code: BO * EXAM: CT CHEST, ABDOMEN, AND PELVIS WITH CONTRAST TECHNIQUE: Multidetector CT imaging of the chest, abdomen and pelvis was performed following the standard protocol during bolus administration of intravenous contrast. RADIATION DOSE REDUCTION: This exam was performed according to the departmental dose-optimization program which includes automated exposure control, adjustment of the mA and/or kV according to patient size and/or use of iterative reconstruction technique. CONTRAST:  OMNIPAQUE IOHEXOL 300 MG/ML  SOLN COMPARISON:  PET-CT, 03/10/2022 FINDINGS: CT CHEST FINDINGS Cardiovascular: Aortic atherosclerosis. Normal heart size. Three-vessel coronary artery calcifications. No pericardial effusion. Mediastinum/Nodes: No enlarged mediastinal, hilar, or axillary lymph nodes. Moderate hiatal hernia with intrathoracic position of the gastric fundus. Unchanged prominent lymph nodes at the right aspect of the hiatal hernia, not previously FDG avid. Thyroid gland, trachea, and esophagus demonstrate no significant findings. Lungs/Pleura: Moderate centrilobular and paraseptal emphysema. Unchanged postoperative findings of right lower lobe wedge resection. No pleural effusion or pneumothorax. Musculoskeletal: No chest wall abnormality. No acute osseous findings. CT ABDOMEN PELVIS FINDINGS Hepatobiliary: No focal liver abnormality is seen. Status post cholecystectomy. Unchanged postoperative biliary ductal dilatation. Pancreas: Unremarkable. No pancreatic ductal dilatation or surrounding inflammatory changes. Spleen: Normal in size without significant abnormality. Adrenals/Urinary Tract: Adrenal glands are unremarkable. Simple, benign left renal cortical cysts, for which no further  follow-up or characterization is required. Kidneys are otherwise normal, without renal calculi, solid lesion, or hydronephrosis. Bladder is unremarkable. Stomach/Bowel: Stomach is within normal limits. Appendix not clearly visualized. No evidence of bowel wall thickening, distention, or inflammatory changes. Descending and sigmoid diverticulosis Vascular/Lymphatic: Aortic atherosclerosis. Interval decrease in size of previously FDG avid retroperitoneal lymph nodes, largest right common iliac node measuring 0.9 x 0.8 cm (series 2, image 73). Unchanged prominent retroperitoneal lymph nodes, not previously FDG avid (series 2, image 63). Reproductive: Prostatomegaly with median lobe hypertrophy. Other: Small fat containing right inguinal hernia. No ascites. Nearly complete resolution of a retroperitoneal soft tissue nodule overlying the right iliac crest measuring 0.7 x 0.5 cm (series 2, image 87). Musculoskeletal: No acute osseous findings. IMPRESSION: 1. Interval decrease in size of previously FDG avid retroperitoneal lymph nodes, as well as a soft tissue nodule overlying the right iliac crest. 2. Additional prominent lymph nodes adjacent to the hiatal hernia and in the retroperitoneum are unchanged, not previously FDG avid. 3. Findings are consistent with treatment  response of lymphoma. 4. Emphysema. 5. Coronary artery disease. 6. Prostatomegaly. Aortic Atherosclerosis (ICD10-I70.0) and Emphysema (ICD10-J43.9). Electronically Signed   By: Jearld Lesch M.D.   On: 07/01/2022 08:39      10/21/2022 -  Chemotherapy   Patient is on Treatment Plan : NON-HODGKINS LYMPHOMA Rituximab q60d Maintenance       PHYSICAL EXAMINATION: ECOG PERFORMANCE STATUS: 0 - Asymptomatic  Vitals:   10/21/22 0846  BP: 113/70  Pulse: 81  Resp: 18  Temp: 98 F (36.7 C)  SpO2: 97%   Filed Weights   10/21/22 0846  Weight: 187 lb 9.6 oz (85.1 kg)    GENERAL:alert, no distress and comfortable   LABORATORY DATA:  I have  reviewed the data as listed    Component Value Date/Time   NA 139 10/21/2022 0810   NA 141 10/04/2022 1059   NA 139 04/14/2016 0943   K 3.7 10/21/2022 0810   K 4.6 04/14/2016 0943   CL 104 10/21/2022 0810   CL 104 03/24/2012 1024   CO2 27 10/21/2022 0810   CO2 25 04/14/2016 0943   GLUCOSE 134 (H) 10/21/2022 0810   GLUCOSE 111 04/14/2016 0943   GLUCOSE 80 03/24/2012 1024   BUN 18 10/21/2022 0810   BUN 18 10/04/2022 1059   BUN 8.6 04/14/2016 0943   CREATININE 1.16 10/21/2022 0810   CREATININE 1.00 08/26/2022 0814   CREATININE 1.1 04/14/2016 0943   CALCIUM 9.0 10/21/2022 0810   CALCIUM 9.4 04/14/2016 0943   PROT 6.1 (L) 10/21/2022 0810   PROT 6.1 10/04/2022 1059   PROT 6.5 04/14/2016 0943   ALBUMIN 4.0 10/21/2022 0810   ALBUMIN 4.3 10/04/2022 1059   ALBUMIN 3.8 04/14/2016 0943   AST 13 (L) 10/21/2022 0810   AST 17 08/26/2022 0814   AST 28 04/14/2016 0943   ALT 13 10/21/2022 0810   ALT 16 08/26/2022 0814   ALT 41 04/14/2016 0943   ALKPHOS 49 10/21/2022 0810   ALKPHOS 71 04/14/2016 0943   BILITOT 1.0 10/21/2022 0810   BILITOT 0.6 10/04/2022 1059   BILITOT 0.5 08/26/2022 0814   BILITOT 0.61 04/14/2016 0943   GFRNONAA >60 10/21/2022 0810   GFRNONAA >60 08/26/2022 0814   GFRAA 69 09/27/2019 0801    No results found for: "SPEP", "UPEP"  Lab Results  Component Value Date   WBC 6.9 10/21/2022   NEUTROABS 4.9 10/21/2022   HGB 15.1 10/21/2022   HCT 45.1 10/21/2022   MCV 87.9 10/21/2022   PLT 147 (L) 10/21/2022      Chemistry      Component Value Date/Time   NA 139 10/21/2022 0810   NA 141 10/04/2022 1059   NA 139 04/14/2016 0943   K 3.7 10/21/2022 0810   K 4.6 04/14/2016 0943   CL 104 10/21/2022 0810   CL 104 03/24/2012 1024   CO2 27 10/21/2022 0810   CO2 25 04/14/2016 0943   BUN 18 10/21/2022 0810   BUN 18 10/04/2022 1059   BUN 8.6 04/14/2016 0943   CREATININE 1.16 10/21/2022 0810   CREATININE 1.00 08/26/2022 0814   CREATININE 1.1 04/14/2016 0943       Component Value Date/Time   CALCIUM 9.0 10/21/2022 0810   CALCIUM 9.4 04/14/2016 0943   ALKPHOS 49 10/21/2022 0810   ALKPHOS 71 04/14/2016 0943   AST 13 (L) 10/21/2022 0810   AST 17 08/26/2022 0814   AST 28 04/14/2016 0943   ALT 13 10/21/2022 0810   ALT 16 08/26/2022 0814  ALT 41 04/14/2016 0943   BILITOT 1.0 10/21/2022 0810   BILITOT 0.6 10/04/2022 1059   BILITOT 0.5 08/26/2022 0814   BILITOT 0.61 04/14/2016 0943

## 2022-10-21 NOTE — Progress Notes (Signed)
Hypersensitivity Reaction note  Date of event: 10/21/22 Time of event: 1132 Generic name of drug involved: rituximab Name of provider notified of the hypersensitivity reaction: Karie Fetch, PA-C Was agent that likely caused hypersensitivity reaction added to Allergies List within EMR? yes Chain of events including reaction signs/symptoms, treatment administered, and outcome (e.g., drug resumed; drug discontinued; sent to Emergency Department; etc.)  1132-pt c/o trouble swallowing, RN noted mild facial flushing. Infusion stopped. Karie Fetch PA-C notified. Fluids hung to gravity. See MAR for medication administration. See flowsheets for vital sign documentation. 1210-patient reported feeling back to normal. Per MD instructions, wait 1 hour and proceed at a reduced rate of infusion.   Consepcion Hearing, RN 10/21/2022 12:21 PM

## 2022-10-21 NOTE — Progress Notes (Signed)
    DATE:  10/21/22                                        X CHEMO/IMMUNOTHERAPY REACTION           MD: Bertis Ruddy   AGENT/BLOOD PRODUCT RECEIVING TODAY:              rituximab-pvvr   AGENT/BLOOD PRODUCT RECEIVING IMMEDIATELY PRIOR TO REACTION:           rituximab-pvvr    Vitals:   10/21/22 1133 10/21/22 1137  BP: 102/62 (!) 104/59  Pulse: (!) 55 (!) 54  Resp: 16 16  SpO2: 97% 99%      REACTION(S):           facial flushing, throat tightness   PREMEDS:     Pepcid 20 mg PO, Benadryl 25 mg PO, Tylenol 650 mg IV  INTERVENTION: Pepcid 20 mg IV, 125 mg solu-medrol, benadryl 25 mg PO   Review of Systems  Review of Systems  HENT:  Positive for trouble swallowing.   Skin:  Positive for color change.  All other systems reviewed and are negative.    Physical Exam  Physical Exam Vitals and nursing note reviewed.  Constitutional:      Appearance: He is not ill-appearing or toxic-appearing.  HENT:     Head: Normocephalic.     Comments: Airway intact. No angioedema Eyes:     Conjunctiva/sclera: Conjunctivae normal.  Cardiovascular:     Rate and Rhythm: Bradycardia present. Rhythm irregular.     Pulses: Normal pulses.     Heart sounds: Normal heart sounds.  Pulmonary:     Effort: Pulmonary effort is normal.     Breath sounds: Normal breath sounds.  Abdominal:     General: There is no distension.  Musculoskeletal:     Cervical back: Normal range of motion.  Skin:    General: Skin is warm and dry.     Comments: Facial flushing  Neurological:     Mental Status: He is alert.     OUTCOME:                Patient became symptomatic 35 minutes into infusion. Symptoms started after rate increase to 200 ml. Emergency medications were administered as documented above. Patient returned to baseline. Oncologist notified and agrees to resume treatment at half rate in 1 hour. Patient given additional benadryl as well prior to re starting. Patient tolerated remainder of treatment.

## 2022-10-26 ENCOUNTER — Other Ambulatory Visit: Payer: Self-pay

## 2022-10-26 MED ORDER — LENALIDOMIDE 15 MG PO CAPS: ORAL_CAPSULE | ORAL | 0 refills | Status: DC

## 2022-10-28 ENCOUNTER — Encounter: Payer: Self-pay | Admitting: Hematology and Oncology

## 2022-11-03 DIAGNOSIS — H401131 Primary open-angle glaucoma, bilateral, mild stage: Secondary | ICD-10-CM | POA: Diagnosis not present

## 2022-11-10 ENCOUNTER — Telehealth: Payer: Self-pay | Admitting: *Deleted

## 2022-11-10 NOTE — Telephone Encounter (Signed)
Anthony Kelly called. [facility] had notified her that a prescription for Allopurinol  was received from Dr. Bertis Ruddy in March. It was filled and ready for pick up. She said they told her it was for gout but that he's never had gout and that he's never taken it. She wants to know if he should start taking it.  Advised her that Dr. Bertis Ruddy had prescribed a 30 day course of Allopurinol 03/30/22 for Anthony Kelly to take for 30 days only when he first started taking Revlimid. It is given prophylactically to prevent side effects when therapy is started.  She said as she remembers it - the med wasn't filled at that time and he never took it.  Advised her that if he has been on Revlimid since March, he does not need to start taking Allopurinol at this time.

## 2022-11-16 ENCOUNTER — Encounter: Payer: Self-pay | Admitting: Cardiovascular Disease

## 2022-11-17 NOTE — Telephone Encounter (Signed)
Spoke with patient, currently in normal sinus rhythm according to his watch. Patient states his Afib episode lasted about 10 hours, asymptomatic, watched alerted to high heart rates (~120 bpm). Patient took an additional metoprolol succinate 12.5 mg dose and rate controlled at that point. No need currently, confirmed appointment with Dr Nelly Laurence on 11/23/22.

## 2022-11-23 ENCOUNTER — Ambulatory Visit: Payer: Medicare Other | Attending: Cardiovascular Disease | Admitting: Cardiovascular Disease

## 2022-11-23 ENCOUNTER — Encounter: Payer: Self-pay | Admitting: Cardiovascular Disease

## 2022-11-23 VITALS — BP 114/64 | HR 59 | Ht 72.0 in | Wt 186.6 lb

## 2022-11-23 DIAGNOSIS — R002 Palpitations: Secondary | ICD-10-CM | POA: Insufficient documentation

## 2022-11-23 DIAGNOSIS — I480 Paroxysmal atrial fibrillation: Secondary | ICD-10-CM | POA: Insufficient documentation

## 2022-11-23 NOTE — Patient Instructions (Addendum)
Medication Instructions:  Your physician recommends that you continue on your current medications as directed. Please refer to the Current Medication list given to you today. *If you need a refill on your cardiac medications before your next appointment, please call your pharmacy*   Follow-Up: At [facility], you and your health needs are our priority.  As part of our continuing mission to provide you with exceptional heart care, we have created designated Provider Care Teams.  These Care Teams include your primary Cardiologist (physician) and Advanced Practice Providers (APPs -  Physician Assistants and Nurse Practitioners) who all work together to provide you with the care you need, when you need it.  We recommend signing up for the patient portal called "MyChart".  Sign up information is provided on this After Visit Summary.  MyChart is used to connect with patients for Virtual Visits (Telemedicine).  Patients are able to view lab/test results, encounter notes, upcoming appointments, etc.  Non-urgent messages can be sent to your provider as well.   To learn more about what you can do with MyChart, go to [email or web address].    Your next appointment:   6 month(s)  Provider:   You will follow up in the Atrial Fibrillation Clinic located at [facility]. Your provider will be: Clint R. Fenton, PA-C or Lake Bells, PA-C

## 2022-11-23 NOTE — Progress Notes (Signed)
Cardiology Office Note:    Date:  11/23/2022   ID:  Anthony Kelly, DOB 1945-04-26, MRN [id number]  PCP:  Dettinger, Elige Radon, MD   [facility] Providers Cardiologist:  Dina Rich, MD Electrophysiologist:  Maurice Small, MD     Referring MD: Dettinger, Elige Radon, MD    History of Present Illness:    Anthony Kelly is a 77 y.o. male with a hx of CAD, Lymphoma, NSVT and PAF who follows-up in electrophysiology clinic for arrhythmia management.  He was last seen by Dr. Johney Frame in September of 2022. At that time, he reported that he was doing well -- not having any palpitations on flecainide.  Today, he reports that he is doing quite well. He has occasional palpitations, but it's nothing compared to when he was initially receiving chemotherapy. He uses his apple watch to capture an ECG when he has palpitations. I reviewed four or five strips. They all showed sinus rhythm with sinus arrhythmia except one, which showed a supraventricular couplet.  He was on flecainide in the past. He underwent AF ablation in February 2024.  He he did have episodes of atrial fibrillation in the blanking period but maintained sinus rhythm for some time afterwards.  He noted brief episodes of A-fib on his Apple Watch.  These are fairly rare.  In November 2024 he had an episode of A-fib lasting about 10 hours.  Max rates were about 120 bpm.  He took an extra dose of metoprolol, which improved his rates.  Otherwise, he reports that he has no complaints - no chest pain, syncope, presyncope, shortness of breath.      EKGs/Labs/Other Studies Reviewed:     TTE 8/32/2022 EF 60-65%. Normal structure and function with mild LVH  Coronary angiogram 06/05/2019: Prox RCA lesion is 25% stenosed. Ramus lesion is 50% stenosed. Prox LAD to Mid LAD lesion is 25% stenosed. Severe right subclavian tortuosity made torquing catheters difficult. If cath was needed in the future, would consider alternate access  site. The left ventricular systolic function is normal. LV end diastolic pressure is normal. The left ventricular ejection fraction is 55-65% by visual estimate. There is no aortic valve stenosis.  7 day Patch monitor:   Occasional supraventricular ectopy in the form of isolated PACs, couplets. Rare triplets. 54 runs of SVT longest 19.2 seconds.   Rare ventricular ectopy in the form of isolated PVCs   Reported symptoms correlated with PACs and short runs of SVT. No AF detected  EKG:  Last EKG results: today - sinus rhythm; rhythm strip shows frequent PACS   Recent Labs: 10/21/2022: ALT 13; BUN 18; Creatinine, Ser 1.16; Hemoglobin 15.1; Platelets 147; Potassium 3.7; Sodium 139    Risk Assessment/Calculations:    CHA2DS2-VASc Score = 4   This indicates a 4.8% annual risk of stroke. The patient's score is based upon: CHF History: 0 HTN History: 1 Diabetes History: 0 Stroke History: 0 Vascular Disease History: 1 Age Score: 2 Gender Score: 0    STOP-Bang Score:  5       Physical Exam:    VS:  BP 114/64 (BP Location: Left Arm, Patient Position: Sitting, Cuff Size: Normal)   Pulse (!) 59   Ht 6' (1.829 m)   Wt 186 lb 9.6 oz (84.6 kg)   SpO2 94%   BMI 25.31 kg/m     Wt Readings from Last 3 Encounters:  11/23/22 186 lb 9.6 oz (84.6 kg)  10/21/22 187 lb 9.6 oz (85.1 kg)  10/04/22 182 lb (82.6 kg)     GEN:  Well nourished, well developed in no acute distress CARDIAC: RRR, no murmurs, rubs, gallops RESPIRATORY:  Normal work of breathing MUSCULOSKELETAL: no edema    ASSESSMENT & PLAN:    Paroxysmal atrial fibrillation:  Failed flecainide.   Status post ablation on February 17, 2022.   He has been undergoing chemotherapy for lymphoma.   He has rare episodes of AF detected by his watch. Continue to monitor for now. We discussed the possibility of repeat ablation but will defer this as long as episodes are rare, particularly since he is still undergoing therapy for  lymphoma  Palpitations: PACs, PVCs  CAD:  mild, non-hemodynamically significant CAD on cath from 2021.  Increases risk of malignant, life threatening arrhythmia from flecainide - I did inform the patient of this.  Lymphoma    Currently under care of Dr. Bertis Ruddy    Follow-up 6 months  Medication Adjustments/Labs and Tests Ordered: Current medicines are reviewed at length with the patient today.  Concerns regarding medicines are outlined above.  Orders Placed This Encounter  Procedures   EKG 12-Lead   No orders of the defined types were placed in this encounter.    Signed, Maurice Small, MD  11/23/2022 11:11 AM    [facility]

## 2022-11-25 ENCOUNTER — Other Ambulatory Visit: Payer: Self-pay

## 2022-11-28 ENCOUNTER — Encounter: Payer: Self-pay | Admitting: Hematology and Oncology

## 2022-11-29 ENCOUNTER — Other Ambulatory Visit: Payer: Self-pay | Admitting: Hematology and Oncology

## 2022-12-14 ENCOUNTER — Encounter: Payer: Self-pay | Admitting: Hematology and Oncology

## 2022-12-14 ENCOUNTER — Ambulatory Visit: Payer: Medicare Other | Admitting: Urology

## 2022-12-14 ENCOUNTER — Ambulatory Visit (HOSPITAL_COMMUNITY)
Admission: RE | Admit: 2022-12-14 | Discharge: 2022-12-14 | Disposition: A | Discharge status: Home / Self Care | Payer: Medicare Other | Source: Ambulatory Visit | Attending: Hematology and Oncology | Admitting: Hematology and Oncology

## 2022-12-14 DIAGNOSIS — K5730 Diverticulosis of large intestine without perforation or abscess without bleeding: Secondary | ICD-10-CM | POA: Diagnosis not present

## 2022-12-14 DIAGNOSIS — N281 Cyst of kidney, acquired: Secondary | ICD-10-CM | POA: Diagnosis not present

## 2022-12-14 DIAGNOSIS — Z8572 Personal history of non-Hodgkin lymphomas: Secondary | ICD-10-CM | POA: Diagnosis not present

## 2022-12-14 DIAGNOSIS — C8280 Other types of follicular lymphoma, unspecified site: Secondary | ICD-10-CM | POA: Insufficient documentation

## 2022-12-14 MED ORDER — IOHEXOL 300 MG/ML  SOLN
100.0000 mL | Freq: Once | INTRAMUSCULAR | Status: AC | PRN
  Administered 2022-12-14: 100 mL via INTRAVENOUS

## 2022-12-14 MED ORDER — IOHEXOL 300 MG/ML  SOLN
30.0000 mL | Freq: Once | INTRAMUSCULAR | Status: AC | PRN
  Administered 2022-12-14: 30 mL via ORAL

## 2022-12-20 ENCOUNTER — Telehealth: Payer: Self-pay

## 2022-12-20 ENCOUNTER — Encounter: Payer: Self-pay | Admitting: Cardiovascular Disease

## 2022-12-20 ENCOUNTER — Encounter: Payer: Self-pay | Admitting: Hematology and Oncology

## 2022-12-20 ENCOUNTER — Telehealth: Payer: Self-pay | Admitting: Cardiovascular Disease

## 2022-12-20 DIAGNOSIS — I959 Hypotension, unspecified: Secondary | ICD-10-CM | POA: Diagnosis not present

## 2022-12-20 DIAGNOSIS — C8590 Non-Hodgkin lymphoma, unspecified, unspecified site: Secondary | ICD-10-CM | POA: Diagnosis not present

## 2022-12-20 DIAGNOSIS — K922 Gastrointestinal hemorrhage, unspecified: Secondary | ICD-10-CM | POA: Diagnosis not present

## 2022-12-20 DIAGNOSIS — K529 Noninfective gastroenteritis and colitis, unspecified: Secondary | ICD-10-CM | POA: Diagnosis not present

## 2022-12-20 DIAGNOSIS — K5730 Diverticulosis of large intestine without perforation or abscess without bleeding: Secondary | ICD-10-CM | POA: Diagnosis not present

## 2022-12-20 DIAGNOSIS — I480 Paroxysmal atrial fibrillation: Secondary | ICD-10-CM | POA: Diagnosis not present

## 2022-12-20 DIAGNOSIS — N400 Enlarged prostate without lower urinary tract symptoms: Secondary | ICD-10-CM | POA: Diagnosis not present

## 2022-12-20 DIAGNOSIS — E872 Acidosis, unspecified: Secondary | ICD-10-CM | POA: Diagnosis not present

## 2022-12-20 DIAGNOSIS — K219 Gastro-esophageal reflux disease without esophagitis: Secondary | ICD-10-CM | POA: Diagnosis not present

## 2022-12-20 DIAGNOSIS — E785 Hyperlipidemia, unspecified: Secondary | ICD-10-CM | POA: Diagnosis not present

## 2022-12-20 DIAGNOSIS — R42 Dizziness and giddiness: Secondary | ICD-10-CM | POA: Diagnosis not present

## 2022-12-20 DIAGNOSIS — K523 Indeterminate colitis: Secondary | ICD-10-CM | POA: Diagnosis not present

## 2022-12-20 DIAGNOSIS — E861 Hypovolemia: Secondary | ICD-10-CM | POA: Diagnosis not present

## 2022-12-20 DIAGNOSIS — Z79899 Other long term (current) drug therapy: Secondary | ICD-10-CM | POA: Diagnosis not present

## 2022-12-20 DIAGNOSIS — R571 Hypovolemic shock: Secondary | ICD-10-CM | POA: Diagnosis not present

## 2022-12-20 DIAGNOSIS — K5100 Ulcerative (chronic) pancolitis without complications: Secondary | ICD-10-CM | POA: Diagnosis not present

## 2022-12-20 DIAGNOSIS — W19XXXA Unspecified fall, initial encounter: Secondary | ICD-10-CM | POA: Diagnosis not present

## 2022-12-20 DIAGNOSIS — R55 Syncope and collapse: Secondary | ICD-10-CM | POA: Diagnosis not present

## 2022-12-20 DIAGNOSIS — Z9049 Acquired absence of other specified parts of digestive tract: Secondary | ICD-10-CM | POA: Diagnosis not present

## 2022-12-20 DIAGNOSIS — J849 Interstitial pulmonary disease, unspecified: Secondary | ICD-10-CM | POA: Diagnosis not present

## 2022-12-20 DIAGNOSIS — I10 Essential (primary) hypertension: Secondary | ICD-10-CM | POA: Diagnosis not present

## 2022-12-20 DIAGNOSIS — K7689 Other specified diseases of liver: Secondary | ICD-10-CM | POA: Diagnosis not present

## 2022-12-20 DIAGNOSIS — E860 Dehydration: Secondary | ICD-10-CM | POA: Diagnosis not present

## 2022-12-20 DIAGNOSIS — Z7901 Long term (current) use of anticoagulants: Secondary | ICD-10-CM | POA: Diagnosis not present

## 2022-12-20 DIAGNOSIS — R1111 Vomiting without nausea: Secondary | ICD-10-CM | POA: Diagnosis not present

## 2022-12-20 NOTE — Telephone Encounter (Signed)
Spoke with patients wife Anthony Kelly) with patient at bedside at ED in [place] due to vomiting and jaw pain, taken around 11 am this morning. [name] was asking about getting patient transferred to [facility], family frustrated due to having to wait for results. Bonnie states EKG, lab, chest xray, and CT have already been completed but not resulted per family, unable to access any records in computer but instructed patient to inquire with ED physician/nurse on staff at facility for an update on patient plan of care. No transfer warranted at this time, still awaiting test results at current facility per [facility].

## 2022-12-20 NOTE — Telephone Encounter (Signed)
Pt spouse called in stating she rushed pt to hospital in [place] due to incident at the store where pt was vomiting, had afib, and jaw pain. She asked if Dr. Nelly Laurence suggests he be transported to [facility].

## 2022-12-20 NOTE — Telephone Encounter (Signed)
Returned call to wife. Anthony Kelly is currently admitted to [facility] in [place] ED. He had a seizure, vomiting and diarrhea at a store this am and was transported to the local ED. No history of seizures. Wife is canceling appts for tomorrow. Appts canceled. Wife is wanting to have [name] transported to [facility]. Instructed her to speak with ED staff about transfer for assistance. She verbalized understanding.  FYI, wife ask that Dr. Bertis Ruddy come see Anthony Kelly if he is transferred at the hospital.

## 2022-12-21 ENCOUNTER — Inpatient Hospital Stay: Payer: Medicare Other

## 2022-12-21 ENCOUNTER — Encounter: Payer: Self-pay | Admitting: Hematology and Oncology

## 2022-12-21 ENCOUNTER — Inpatient Hospital Stay: Payer: Medicare Other | Attending: Hematology and Oncology | Admitting: Hematology and Oncology

## 2022-12-21 ENCOUNTER — Telehealth: Payer: Self-pay

## 2022-12-21 NOTE — Telephone Encounter (Signed)
Called regarding mychart message and to follow up on phone call yesterday. Spoke with wife, Kendal Hymen. Anthony Kelly has colitis and did not have a seizure as she previously thought. He will be in the hospital for a few days in [place].  Kendal Hymen will call the office when Anthony Kelly is d/ced so appts can be rescheduled. Reminded Kendal Hymen that we are unable to see notes at the hospital at the ER at [facility]. She verbalized understanding.  FYI

## 2022-12-22 ENCOUNTER — Encounter: Payer: Self-pay | Admitting: Hematology and Oncology

## 2022-12-22 ENCOUNTER — Other Ambulatory Visit: Payer: Self-pay | Admitting: Hematology and Oncology

## 2022-12-23 ENCOUNTER — Encounter: Payer: Self-pay | Admitting: Cardiovascular Disease

## 2022-12-23 ENCOUNTER — Telehealth: Payer: Self-pay

## 2022-12-23 NOTE — Progress Notes (Signed)
Name: Anthony Kelly DOB: Aug 23, 1945 MRN: [id number]  History of Present Illness: Anthony Kelly is a 77 y.o. male who presents today at [facility]. He is accompanied by his wife. - GU history: 1. BPH with LUTS (incomplete bladder emptying). - Taking Proscar. - Previously took Rapaflo. - Prior PSA values normal (last found: 0.7 on 03/02/2021).  Recent history:  > 09/09/2022: Underwent TURP by Dr. Ronne Binning. Benign pathology.  > 09/14/2022: Postop visit. Passed voiding trial.  Today: He reports that he was in the hospital in [place] last week for sigmoid colitis and is currently on an antibiotic for that. Denies fevers.  He reports solid urinary stream. He denies urinary hesitancy, urgency, frequency, dysuria, gross hematuria, straining to void, or sensations of incomplete emptying.   Fall Screening: Do you usually have a device to assist in your mobility? No   Medications: Current Outpatient Medications  Medication Sig Dispense Refill   acetaminophen (TYLENOL) 500 MG tablet Take 500-1,000 mg by mouth every 6 (six) hours as needed for moderate pain.     acyclovir (ZOVIRAX) 400 MG tablet TAKE 1 TABLET(400 MG) BY MOUTH DAILY 90 tablet 1   apixaban (ELIQUIS) 5 MG TABS tablet TAKE 1 TABLET TWICE A DAY 180 tablet 2   Ascorbic Acid (VITAMIN C) 1000 MG tablet Take 1,000 mg by mouth daily.     brimonidine (ALPHAGAN) 0.2 % ophthalmic solution Place 1 drop into both eyes 2 (two) times daily.      Calcium Carbonate-Vit D-Min (CALCIUM 1200) 1200-1000 MG-UNIT CHEW Chew 1 tablet by mouth daily.     finasteride (PROSCAR) 5 MG tablet Take 1 tablet (5 mg total) by mouth daily. 90 tablet 3   fluorouracil (EFUDEX) 5 % cream Apply topically.     lenalidomide (REVLIMID) 15 MG capsule TAKE 1 CAPSULE DAILY FOR 21 DAYS ON, THEN 7 DAYS OFF, REPEAT EVERY 28 DAYS 21 capsule 0   Lifitegrast (XIIDRA) 5 % SOLN Place 1 drop into both eyes 2 (two) times daily.     LUMIGAN 0.01 % SOLN Place 1  drop into both eyes at bedtime.     Magnesium 250 MG TABS Take 1 tablet by mouth daily.     metoprolol succinate (TOPROL-XL) 25 MG 24 hr tablet Take 0.5 tablets (12.5 mg total) by mouth daily. (Patient taking differently: Take 12.5 mg by mouth 2 (two) times daily.) 90 tablet 3   Misc Natural Products (BEET ROOT PO) Take 1 Dose by mouth daily.     mometasone (ELOCON) 0.1 % cream Apply 1 Application topically daily.     Multiple Vitamin (MULTIVITAMIN WITH MINERALS) TABS tablet Take 1 tablet by mouth daily.      omeprazole (PRILOSEC OTC) 20 MG tablet Take 20 mg by mouth every other day.     Polyethyl Glycol-Propyl Glycol (SYSTANE OP) Place 1 drop into both eyes daily as needed (dry eyes).     polyethylene glycol (MIRALAX / GLYCOLAX) 17 g packet Take 8.5 g by mouth daily.     rosuvastatin (CRESTOR) 20 MG tablet Take 1 tablet (20 mg total) by mouth daily. 90 tablet 3   traMADol (ULTRAM) 50 MG tablet Take 1 tablet (50 mg total) by mouth every 12 (twelve) hours as needed. 30 tablet 0   No current facility-administered medications for this visit.    Allergies: Allergies  Allergen Reactions   Rituximab Other (See Comments)    Throat tightness and facial flushing. Patient had hypersensitivty reaction to rapid rituximab. 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 04/2013 -adm with CP, elevated CRP. H/o coronary artery calcification on prior CT but nuc was negative, EF 71%.   Arthritis    Atrial fibrillation (HCC)    a. Isolated episode in the setting of acute pericarditis 04/2013. Was not placed on anticoag.   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   Metastasis to lung (HCC) dx'd 01/2007   Metastasis to lymph nodes (HCC) dx'd 03/2011   lt submandibular ln   Pain in joint, pelvic region and thigh 08/01/2013   PSVT (paroxysmal supraventricular tachycardia) (HCC)    SCC (squamous cell carcinoma)    Left cheek   Past Surgical History:  Procedure Laterality Date   ATRIAL FIBRILLATION ABLATION N/A 02/17/2022   Procedure: ATRIAL FIBRILLATION ABLATION;  Surgeon: Maurice Small, MD;  Location: [facility] INVASIVE CV LAB;  Service: Cardiovascular;  Laterality: N/A;   CHOLECYSTECTOMY     CYSTOSCOPY N/A 09/09/2022   Procedure: CYSTOSCOPY;  Surgeon: Malen Gauze, MD;  Location: [facility] ORS;  Service: Urology;  Laterality: N/A;   EXPLORATORY LAPAROTOMY     EYE SURGERY Right 2016   cataract   LEFT HEART CATH AND CORONARY ANGIOGRAPHY N/A 06/05/2019   Procedure: LEFT HEART CATH AND CORONARY ANGIOGRAPHY;  Surgeon: Corky Crafts, MD;  Location: [facility] INVASIVE CV LAB;  Service: Cardiovascular;  Laterality: N/A;   LUNG LOBECTOMY     right side   LYMPH NODE BIOPSY     in groin with removal   MENISCUS REPAIR     right knee   MOHS SURGERY Left 09/2020   cheek   PROSTATE SURGERY     REFRACTIVE SURGERY Right    piece of metal removed   SUBMANDIBULAR GLAND EXCISION  04/2011   SUBMANDIBULAR GLAND EXCISION  04/30/2011   Procedure: EXCISION SUBMANDIBULAR GLAND;  Surgeon: Osborn Coho, MD;  Location: [facility] OR;  Service: ENT;  Laterality: Left;  WITH DIAGNOSTIC BIOPSY   TONSILLECTOMY     as a child   TRANSURETHRAL RESECTION OF PROSTATE N/A 09/09/2022   Procedure: TRANSURETHRAL RESECTION OF THE PROSTATE (TURP);  Surgeon: Malen Gauze, MD;  Location: [facility] ORS;  Service: Urology;   Laterality: N/A;   VEIN LIGATION AND STRIPPING     right leg   Family History  Problem Relation Age of Onset   Heart disease Father    Heart attack Father        x 3   Cancer Father    Cancer Sister        breast ca   Cancer Brother        prostate ca   Heart attack Sister    Cancer Sister        breast   Heart disease Brother    Alzheimer's disease Sister    Diabetes Sister  Heart disease Brother    Diabetes Brother    Heart disease Brother    Heart disease Brother    Heart disease Brother    Heart disease Brother    Heart disease Brother    Alzheimer's disease Brother    Diabetes Son    Cancer Brother    Anesthesia problems Neg Hx    Social History   Socioeconomic History   Marital status: Married    Spouse name: Kendal Hymen   Number of children: 2   Years of education: GED   Highest education level: Some college, no degree  Occupational History   Occupation: Retired     Comment: Multimedia programmer   Occupation: Retired     Comment: [employer]  Tobacco Use   Smoking status: Former    Current packs/day: 0.00    Average packs/day: 1 pack/day for 21.7 years (21.7 ttl pk-yrs)    Types: Cigarettes    Start date: 12/12/1962    Quit date: 08/25/1984    Years since quitting: 38.3   Smokeless tobacco: Never  Vaping Use   Vaping status: Never Used  Substance and Sexual Activity   Alcohol use: Not Currently   Drug use: Never   Sexual activity: Not Currently  Other Topics Concern   Not on file  Social History Narrative   Right handed   Decaf coffee (2-3 per day)   Lives with wife   Social Drivers of Corporate investment banker Strain: Low Risk  (10/01/2022)   Overall Financial Resource Strain (CARDIA)    Difficulty of Paying Living Expenses: Not hard at all  Food Insecurity: No Food Insecurity (10/01/2022)   Hunger Vital Sign    Worried About Running Out of Food in the Last Year: Never true    Ran Out of Food in the Last Year: Never true  Transportation  Needs: No Transportation Needs (10/01/2022)   PRAPARE - Administrator, Civil Service (Medical): No    Lack of Transportation (Non-Medical): No  Physical Activity: Inactive (10/01/2022)   Exercise Vital Sign    Days of Exercise per Week: 0 days    Minutes of Exercise per Session: 30 min  Stress: No Stress Concern Present (10/01/2022)   [employer] of Occupational Health - Occupational Stress Questionnaire    Feeling of Stress : Not at all  Social Connections: Moderately Integrated (10/01/2022)   Social Connection and Isolation Panel [NHANES]    Frequency of Communication with Friends and Family: Three times a week    Frequency of Social Gatherings with Friends and Family: Patient declined    Attends Religious Services: More than 4 times per year    Active Member of [employer] or Organizations: No    Attends Banker Meetings: Never    Marital Status: Married  Catering manager Violence: Not At Risk (09/10/2022)   Humiliation, Afraid, Rape, and Kick questionnaire    Fear of Current or Ex-Partner: No    Emotionally Abused: No    Physically Abused: No    Sexually Abused: No    Review of Systems Constitutional: Patient denies any unintentional weight loss or change in strength lntegumentary: Patient denies any rashes or pruritus Cardiovascular: Patient denies chest pain or syncope Respiratory: Patient denies shortness of breath Gastrointestinal: Patient denies constipation. Currently having diarrhea related to last week's colitis episode. Some nausea. Musculoskeletal: Patient denies muscle cramps or weakness Neurologic: Patient denies convulsions or seizures Allergic/Immunologic: Patient denies recent allergic reaction(s) Hematologic/Lymphatic: Patient denies bleeding  tendencies Endocrine: Patient denies heat/cold intolerance  GU: As per HPI.  OBJECTIVE Vitals:   12/27/22 1304  BP: 114/75  Pulse: 68   There is no height or weight on file to calculate  BMI.  Physical Examination Constitutional: No obvious distress; patient is non-toxic appearing  Cardiovascular: No visible lower extremity edema.  Respiratory: The patient does not have audible wheezing/stridor; respirations do not appear labored  Gastrointestinal: Abdomen non-distended Musculoskeletal: Normal ROM of UEs  Skin: No obvious rashes/open sores  Neurologic: CN 2-12 grossly intact Psychiatric: Answered questions appropriately with normal affect  Hematologic/Lymphatic/Immunologic: No obvious bruises or sites of spontaneous bleeding  Urine microscopy: negative  PVR: 113 ml  ASSESSMENT Benign prostatic hyperplasia with urinary frequency - Plan: Urinalysis, Routine w reflex microscopic, BLADDER SCAN AMB NON-IMAGING  S/P TURP - Plan: Urinalysis, Routine w reflex microscopic, BLADDER SCAN AMB NON-IMAGING  Doing well from a urology standpoint with no acute concerns. Advised to continue Proscar 5 mg daily to prevent prostate regrowth. Will plan for follow up in 1 year or sooner if needed. Pt verbalized understanding and agreement. All questions were answered.  PLAN Advised the following: 1. Continue Proscar 5 mg daily 2. Return in about 1 year (around 12/27/2023) for UA, PVR, & f/u with Evette Georges NP.  Orders Placed This Encounter  Procedures   Urinalysis, Routine w reflex microscopic   BLADDER SCAN AMB NON-IMAGING    It has been explained that the patient is to follow regularly with their PCP in addition to all other providers involved in their care and to follow instructions provided by these respective offices. Patient advised to contact urology clinic if any urologic-pertaining questions, concerns, new symptoms or problems arise in the interim period.  There are no Patient Instructions on file for this visit.  Electronically signed by:  Donnita Falls, FNP   12/27/22    1:31 PM

## 2022-12-23 NOTE — Telephone Encounter (Signed)
Called regarding mychart message and given below message from Dr. Bertis Ruddy. Kendal Hymen verbalized understanding and said Elijah Birk is being d/ced from [facility]. Dr. Bertis Ruddy made aware.

## 2022-12-23 NOTE — Telephone Encounter (Signed)
-----   Message from Artis Delay sent at 12/23/2022  8:12 AM EST ----- Call Anthony Kelly Hold revlimid until his antibiotics are done When he gets home he can resume eliquis I sent LOS to reschedule his appt to next week

## 2022-12-24 ENCOUNTER — Telehealth: Payer: Self-pay | Admitting: Hematology and Oncology

## 2022-12-24 ENCOUNTER — Other Ambulatory Visit: Payer: Self-pay | Admitting: Hematology and Oncology

## 2022-12-24 NOTE — Telephone Encounter (Signed)
Spoke with patient confirming upcoming appointment  

## 2022-12-27 ENCOUNTER — Ambulatory Visit (INDEPENDENT_AMBULATORY_CARE_PROVIDER_SITE_OTHER): Payer: Medicare Other | Admitting: Urology

## 2022-12-27 VITALS — BP 114/75 | HR 68

## 2022-12-27 DIAGNOSIS — R350 Frequency of micturition: Secondary | ICD-10-CM | POA: Diagnosis not present

## 2022-12-27 DIAGNOSIS — Z9079 Acquired absence of other genital organ(s): Secondary | ICD-10-CM | POA: Diagnosis not present

## 2022-12-27 DIAGNOSIS — N401 Enlarged prostate with lower urinary tract symptoms: Secondary | ICD-10-CM

## 2022-12-27 LAB — URINALYSIS, ROUTINE W REFLEX MICROSCOPIC
Bilirubin, UA: NEGATIVE
Glucose, UA: NEGATIVE
Ketones, UA: NEGATIVE
Nitrite, UA: NEGATIVE
Protein,UA: NEGATIVE
Specific Gravity, UA: <1.005 — ABNORMAL LOW (ref 1.005–1.030)
Urobilinogen, Ur: 0.2 mg/dL (ref 0.2–1.0)
pH, UA: 6 (ref 5.0–7.5)

## 2022-12-27 LAB — MICROSCOPIC EXAMINATION: Bacteria, UA: NONE SEEN

## 2022-12-27 LAB — BLADDER SCAN AMB NON-IMAGING: Scan Result: 133

## 2022-12-27 NOTE — Progress Notes (Signed)
post void residual=133

## 2022-12-28 ENCOUNTER — Encounter: Payer: Self-pay | Admitting: Cardiovascular Disease

## 2022-12-28 ENCOUNTER — Encounter: Payer: Self-pay | Admitting: Nurse Practitioner

## 2022-12-28 ENCOUNTER — Other Ambulatory Visit: Payer: Self-pay

## 2022-12-28 ENCOUNTER — Ambulatory Visit: Payer: Medicare Other | Admitting: Nurse Practitioner

## 2022-12-28 ENCOUNTER — Telehealth: Payer: Self-pay

## 2022-12-28 ENCOUNTER — Encounter: Payer: Self-pay | Admitting: Hematology and Oncology

## 2022-12-28 ENCOUNTER — Other Ambulatory Visit (INDEPENDENT_AMBULATORY_CARE_PROVIDER_SITE_OTHER): Payer: Medicare Other

## 2022-12-28 VITALS — BP 122/64 | HR 67 | Ht 72.0 in | Wt 180.0 lb

## 2022-12-28 DIAGNOSIS — Z7901 Long term (current) use of anticoagulants: Secondary | ICD-10-CM | POA: Diagnosis not present

## 2022-12-28 DIAGNOSIS — K529 Noninfective gastroenteritis and colitis, unspecified: Secondary | ICD-10-CM

## 2022-12-28 DIAGNOSIS — K219 Gastro-esophageal reflux disease without esophagitis: Secondary | ICD-10-CM | POA: Diagnosis not present

## 2022-12-28 DIAGNOSIS — I2510 Atherosclerotic heart disease of native coronary artery without angina pectoris: Secondary | ICD-10-CM

## 2022-12-28 DIAGNOSIS — I4891 Unspecified atrial fibrillation: Secondary | ICD-10-CM

## 2022-12-28 DIAGNOSIS — Z87891 Personal history of nicotine dependence: Secondary | ICD-10-CM | POA: Diagnosis not present

## 2022-12-28 DIAGNOSIS — C8290 Follicular lymphoma, unspecified, unspecified site: Secondary | ICD-10-CM

## 2022-12-28 LAB — CBC WITH DIFFERENTIAL/PLATELET
Basophils Absolute: 0.1 10*3/uL (ref 0.0–0.1)
Basophils Relative: 1 % (ref 0.0–3.0)
Eosinophils Absolute: 0.3 10*3/uL (ref 0.0–0.7)
Eosinophils Relative: 5 % (ref 0.0–5.0)
HCT: 43.4 % (ref 39.0–52.0)
Hemoglobin: 14.7 g/dL (ref 13.0–17.0)
Lymphocytes Relative: 22.6 % (ref 12.0–46.0)
Lymphs Abs: 1.5 10*3/uL (ref 0.7–4.0)
MCHC: 33.8 g/dL (ref 30.0–36.0)
MCV: 88.3 fL (ref 78.0–100.0)
Monocytes Absolute: 1.3 10*3/uL — ABNORMAL HIGH (ref 0.1–1.0)
Monocytes Relative: 19.5 % — ABNORMAL HIGH (ref 3.0–12.0)
Neutro Abs: 3.4 10*3/uL (ref 1.4–7.7)
Neutrophils Relative %: 51.9 % (ref 43.0–77.0)
Platelets: 205 10*3/uL (ref 150.0–400.0)
RBC: 4.92 Mil/uL (ref 4.22–5.81)
RDW: 15.5 % (ref 11.5–15.5)
WBC: 6.6 10*3/uL (ref 4.0–10.5)

## 2022-12-28 LAB — COMPREHENSIVE METABOLIC PANEL
ALT: 20 U/L (ref 0–53)
AST: 21 U/L (ref 0–37)
Albumin: 4 g/dL (ref 3.5–5.2)
Alkaline Phosphatase: 54 U/L (ref 39–117)
BUN: 17 mg/dL (ref 6–23)
CO2: 25 meq/L (ref 19–32)
Calcium: 8.8 mg/dL (ref 8.4–10.5)
Chloride: 106 meq/L (ref 96–112)
Creatinine, Ser: 1.03 mg/dL (ref 0.40–1.50)
GFR: 69.8 mL/min (ref >60.00)
Glucose, Bld: 123 mg/dL — ABNORMAL HIGH (ref 70–99)
Potassium: 4.4 meq/L (ref 3.5–5.1)
Sodium: 137 meq/L (ref 135–145)
Total Bilirubin: 0.5 mg/dL (ref 0.2–1.2)
Total Protein: 6.2 g/dL (ref 6.0–8.3)

## 2022-12-28 MED ORDER — PLENVU 140 G PO SOLR: 1.0000 | ORAL | 0 refills | Status: DC

## 2022-12-28 NOTE — Progress Notes (Unsigned)
12/28/2022 Anthony Kelly [id number] 08-15-1945   CHIEF COMPLAINT: Schedule a colonoscopy   HISTORY OF PRESENT ILLNESS: Anthony Kelly is a 77 y.o. male with a hx of CAD, recurrent follicular lymphoma, NSVT and PAF s/p ablation 02/2022 on Eliquis, emphysema, glaucoma, BPH, GERD and colon polyps. He presented to [facility] in [place] 12/20/2022 after he experienced abrupt onset of diarrhea while he was at the [facility]. He had dizziness with a near syncopal episode and fell hitting his head and left arm. He stated his eyes rolled back into his head and EMS initially thought he had a seizure but it was later determined that he did not have a seizure. He stated an abdominal CT scan showed evidence of sigmoid colitis and he was treated with IV antibiotics and discharged home 3 days later on oral Cipro and Flagyl x 10 days. Note, a surveillance CTAP done 12/14/2022 showed diverticulosis without diverticulitis or colitis. He presents without his hospital records and his CTAP done 12/20/2022 is not accessible in care everywhere. Currently, he feels "fine". He is passing 2 loose stools most days since his hospitalization, yesterday he passed only one loose BM. He possible saw a small amount of blood in his stool on one occasion otherwise no bloody stools. No abdominal pain. No N/V. Appetite is good. He underwent a colonoscopy 02/22/2014 by Dr. Leone Payor which identified one large polyp removed from the sigmoid colon, path report showed a lipoma and not a colon adenoma and severe sigmoid diverticulosis. He was advised to repeat a colonoscopy in 10 years. No GERD symptoms on Omeprazole 20mg  po every other day.      Latest Ref Rng & Units 10/21/2022    8:10 AM 10/04/2022   10:59 AM 09/10/2022    4:11 AM  CBC  WBC 4.0 - 10.5 K/uL 6.9  5.6  12.6   Hemoglobin 13.0 - 17.0 g/dL 62.9  52.8  41.3   Hematocrit 39.0 - 52.0 % 45.1  46.2  39.8   Platelets 150 - 400 K/uL 147  196  164        Latest Ref Rng &  Units 10/21/2022    8:10 AM 10/04/2022   10:59 AM 09/10/2022    4:11 AM  CMP  Glucose 70 - 99 mg/dL 244  010  272   BUN 8 - 23 mg/dL 18  18  14    Creatinine 0.61 - 1.24 mg/dL 5.36  6.44  0.34   Sodium 135 - 145 mmol/L 139  141  136   Potassium 3.5 - 5.1 mmol/L 3.7  4.9  4.5   Chloride 98 - 111 mmol/L 104  101  105   CO2 22 - 32 mmol/L 27  26  24    Calcium 8.9 - 10.3 mg/dL 9.0  9.1  8.3   Total Protein 6.5 - 8.1 g/dL 6.1  6.1    Total Bilirubin 0.3 - 1.2 mg/dL 1.0  0.6    Alkaline Phos 38 - 126 U/L 49  73    AST 15 - 41 U/L 13  13    ALT 0 - 44 U/L 13  19      Chest/abd/pelvic CT 12/14/2022: FINDINGS: CT CHEST FINDINGS   Cardiovascular: Scattered aortic atherosclerosis. No central pulmonary embolus on this nondedicated study. Normal size heart. Three-vessel coronary artery calcifications.   Mediastinum/Nodes: No suspicious thyroid nodule. Moderate-sized hiatal hernia   No pathologically enlarged mediastinal, hilar or axillary lymph nodes.   Unchanged prominent lymph nodes in the  right side of the hiatal hernia for instance on image 48/2 not previously FDG avid.   Lungs/Pleura: Moderate centrilobular and paraseptal emphysema. Similar postoperative findings from right lower lobe wedge resection. Scattered atelectasis/scarring.   No suspicious pulmonary nodules or masses.   Musculoskeletal: No aggressive lytic or blastic lesion of bone. 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   CT ABDOMEN PELVIS FINDINGS   Hepatobiliary: Fluid signal hypodensity in the left lobe of the liver measuring 13 mm on image 53/2 is stable from prior and compatible with a benign cyst. No solid  enhancing hepatic lesion. Gallbladder is unremarkable. Prominence of the biliary tree is similar over multiple prior examinations and compatible with reservoir effect post cholecystectomy.   Pancreas: No pancreatic ductal dilation or evidence of acute inflammation.   Spleen: No splenomegaly.   Adrenals/Urinary Tract: Bilateral adrenal glands appear normal. No hydronephrosis. Stable left renal cysts measuring up to 4.6 cm. Kidneys demonstrate symmetric enhancement. Urinary bladder is unremarkable for degree of distension.   Stomach/Bowel: Radiopaque enteric contrast material traverses the descending colon. Moderate-sized hiatal hernia. No pathologic dilation of small or large bowel. Colonic diverticulosis without findings of acute diverticulitis.   Vascular/Lymphatic: Aortic atherosclerosis. Smooth IVC contours. The portal, splenic and superior mesenteric veins are patent.   Unchanged size of the prominent retroperitoneal lymph nodes. For reference:   -left periaortic lymph node at the level of the renal vein measures 5 mm on image 64/2, unchanged.   -right common iliac lymph node measures 4 mm in short axis on image 89/2, unchanged.   No new pathologically enlarged or enlarging lymph nodes in the abdomen or pelvis identified.   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   Other: No significant abdominopelvic free fluid.   Musculoskeletal: Stable small soft tissue nodule versus lymph node in the retroperitoneal soft tissues overlying the right iliac crest on image 89/2.   No aggressive lytic or blastic lesion of bone. Multilevel degenerative change of the spine.   IMPRESSION: 1. Stable examination without evidence of new or progressive disease in the chest, abdomen or pelvis. 2. Aortic Atherosclerosis   PAST GI PROCEDURES:  Colonoscopy 02/22/2014 by Dr. Leone Payor  1) 8-10 mm semi-pedunculated distal sigmoid polyp removed with snare cautery and sent to pathology   2) severe sigmoid diverticulosis  3) Otherwise normal colonoscopy, good prep - LIPOMA. NO ADENOMATOUS CHANGE OR EVIDENCE OF MALIGNANCY. - Recall colonoscopy 02/2024  CARDIAC STUDIES:  TTE 8/32/2022 EF 60-65%. Normal structure and function with mild LVH   Coronary angiogram 06/05/2019: Prox RCA lesion is 25% stenosed. Ramus lesion is 50% stenosed. Prox LAD to Mid LAD lesion is 25% stenosed. Severe right subclavian tortuosity made torquing catheters difficult. If cath was needed in the future, would consider alternate access site. The left ventricular systolic function is normal. LV end diastolic pressure is normal. The left ventricular ejection fraction is 55-65% by visual estimate. There is no aortic valve stenosis.   7 day Patch monitor:   Occasional supraventricular ectopy in the form of isolated PACs, couplets. Rare triplets. 54 runs of SVT longest 19.2 seconds.   Rare ventricular ectopy in the form of isolated PVCs   Reported symptoms correlated with PACs and short runs of SVT. 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 04/2013 -adm with CP, elevated CRP. H/o coronary artery calcification on prior CT but nuc was negative, EF 71%.   Arthritis    Atrial fibrillation (HCC)    a. Isolated episode in the setting of acute pericarditis 04/2013. Was not placed on anticoag.   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   Initial dx 6/04 left inguinal adenopathy Rx observation; convert to hi grade 11/05 Rx CHOP-R; lesion right lung resected 12/08: low grade NHL; new lesion left submandibular gland 2/13  resected 04/30/11 lo grade NHL   Malignant lymphoma, high  grade (HCC) 03/14/2011   Metastasis to lung (HCC) dx'd 01/2007   Metastasis to lymph nodes (HCC) dx'd 03/2011   lt submandibular ln   Pain in joint, pelvic region and thigh 08/01/2013   PSVT (paroxysmal supraventricular tachycardia) (HCC)    SCC (squamous cell carcinoma)    Left cheek   Past Surgical History:  Procedure Laterality Date   ATRIAL FIBRILLATION ABLATION N/A 02/17/2022   Procedure: ATRIAL FIBRILLATION ABLATION;  Surgeon: Maurice Small, MD;  Location: [facility] INVASIVE CV LAB;  Service: Cardiovascular;  Laterality: N/A;   CHOLECYSTECTOMY     CYSTOSCOPY N/A 09/09/2022   Procedure: CYSTOSCOPY;  Surgeon: Malen Gauze, MD;  Location: [facility] ORS;  Service: Urology;  Laterality: N/A;   EXPLORATORY LAPAROTOMY     EYE SURGERY Right 2016   cataract   LEFT HEART CATH AND CORONARY ANGIOGRAPHY N/A 06/05/2019   Procedure: LEFT HEART CATH AND CORONARY ANGIOGRAPHY;  Surgeon: Corky Crafts, MD;  Location: [facility] INVASIVE CV LAB;  Service: Cardiovascular;  Laterality: N/A;   LUNG LOBECTOMY     right side   LYMPH NODE BIOPSY     in groin with removal   MENISCUS REPAIR     right knee   MOHS SURGERY Left 09/2020   cheek   PROSTATE SURGERY     REFRACTIVE SURGERY Right    piece of metal removed   SUBMANDIBULAR GLAND EXCISION  04/2011   SUBMANDIBULAR GLAND EXCISION  04/30/2011   Procedure: EXCISION SUBMANDIBULAR GLAND;  Surgeon: Osborn Coho, MD;  Location: [facility] OR;  Service: ENT;  Laterality: Left;  WITH DIAGNOSTIC BIOPSY   TONSILLECTOMY     as a child   TRANSURETHRAL RESECTION OF PROSTATE N/A 09/09/2022   Procedure: TRANSURETHRAL RESECTION OF THE PROSTATE (TURP);  Surgeon: Malen Gauze, MD;  Location: [facility] ORS;  Service: Urology;  Laterality: N/A;   VEIN LIGATION AND STRIPPING     right leg   Social History:  Patient reports that he quit smoking about 38 years ago. His smoking use included cigarettes. He started smoking about 60 years ago. He has a 21.7 pack-year smoking history.  He has never used smokeless tobacco. He reports that he does not currently use alcohol. He reports that he does not use drugs.  Family History: family history includes Alzheimer's disease in his brother and sister; Cancer in his brother, brother, father, sister, and sister; Diabetes in his brother, sister, and son; Heart attack in his father and sister; Heart disease in his brother, brother, brother, brother, brother, brother, brother,  and father.  Allergies  Allergen Reactions   Rituximab Other (See Comments)    Throat tightness and facial flushing. Patient had hypersensitivty reaction to rapid rituximab. See progress note dated 10/21/2023      Outpatient Encounter Medications as of 12/28/2022  Medication Sig   acetaminophen (TYLENOL) 500 MG tablet Take 500-1,000 mg by mouth every 6 (six) hours as needed for moderate pain.   acyclovir (ZOVIRAX) 400 MG tablet TAKE 1 TABLET(400 MG) BY MOUTH DAILY   apixaban (ELIQUIS) 5 MG TABS tablet TAKE 1 TABLET TWICE A DAY   Ascorbic Acid (VITAMIN C) 1000 MG tablet Take 1,000 mg by mouth daily.   brimonidine (ALPHAGAN) 0.2 % ophthalmic solution Place 1 drop into both eyes 2 (two) times daily.    Calcium Carbonate-Vit D-Min (CALCIUM 1200) 1200-1000 MG-UNIT CHEW Chew 1 tablet by mouth daily.   ciprofloxacin (CIPRO) 500 MG/5ML (10%) suspension    finasteride (PROSCAR) 5 MG tablet Take 1 tablet (5 mg total) by mouth daily.   fluorouracil (EFUDEX) 5 % cream Apply topically.   lenalidomide (REVLIMID) 15 MG capsule TAKE 1 CAPSULE DAILY FOR 21 DAYS ON, THEN 7 DAYS OFF, REPEAT EVERY 28 DAYS   Lifitegrast (XIIDRA) 5 % SOLN Place 1 drop into both eyes 2 (two) times daily.   LUMIGAN 0.01 % SOLN Place 1 drop into both eyes at bedtime.   Magnesium 250 MG TABS Take 1 tablet by mouth daily.   metoprolol succinate (TOPROL-XL) 25 MG 24 hr tablet Take 0.5 tablets (12.5 mg total) by mouth daily. (Patient taking differently: Take 12.5 mg by mouth 2 (two) times daily.)    metroNIDAZOLE (FLAGYL) 500 MG tablet Take 500 mg by mouth 3 (three) times daily.   Misc Natural Products (BEET ROOT PO) Take 1 Dose by mouth daily.   mometasone (ELOCON) 0.1 % cream Apply 1 Application topically daily.   Multiple Vitamin (MULTIVITAMIN WITH MINERALS) TABS tablet Take 1 tablet by mouth daily.    omeprazole (PRILOSEC OTC) 20 MG tablet Take 20 mg by mouth every other day.   Polyethyl Glycol-Propyl Glycol (SYSTANE OP) Place 1 drop into both eyes daily as needed (dry eyes).   polyethylene glycol (MIRALAX / GLYCOLAX) 17 g packet Take 8.5 g by mouth daily.   rosuvastatin (CRESTOR) 20 MG tablet Take 1 tablet (20 mg total) by mouth daily.   traMADol (ULTRAM) 50 MG tablet Take 1 tablet (50 mg total) by mouth every 12 (twelve) hours as needed.   No facility-administered encounter medications on file as of 12/28/2022.    REVIEW OF SYSTEMS:  Gen: Denies fever, sweats or chills. No weight loss.  CV: Infrequent palpitations. No CP. Resp: Denies cough, shortness of breath of hemoptysis.  GI: See HPI. GU: Denies urinary burning, blood in urine, increased urinary frequency or incontinence. MS: Denies joint pain, muscles aches or weakness. Derm: Denies rash, itchiness, skin lesions or unhealing ulcers. Psych: Denies depression, anxiety, memory loss or confusion. Heme: Denies bruising, easy bleeding. Neuro: See HPI. Endo:  Denies any problems with DM, thyroid or adrenal function.  PHYSICAL EXAM: BP 122/64   Pulse 67   Ht 6' (1.829 m)   Wt 180 lb (81.6 kg)   BMI 24.41 kg/m  General:  77 year old male in no acute distress. Head: Normocephalic and atraumatic. Eyes:  Sclerae non-icteric, conjunctive pink. Ears: Normal auditory acuity. Mouth: Dentition intact. No ulcers or lesions.  Neck: Supple, no lymphadenopathy or thyromegaly.  Lungs: Clear bilaterally to auscultation without wheezes, crackles or  rhonchi. Heart: Regular rate and rhythm. No murmur, rub or gallop appreciated.   Abdomen: Soft, nontender, nondistended. No masses. No hepatosplenomegaly. Normoactive bowel sounds x 4 quadrants. Midline scar above umbilicus intact.  Rectal: Deferred.  Musculoskeletal: Symmetrical with no gross deformities. Skin: Warm and dry. No rash or lesions on visible extremities. Extremities: No edema. Neurological: Alert oriented x 4, no focal deficits.  Psychological:  Alert and cooperative. Normal mood and affect.  ASSESSMENT AND PLAN:  77 year old male recently admitted to [facility] with acute colitis, presumed infectious colitis treated with IV antibiotics then oral Cipro and Flagyl. Patient passing 1 to 2 loose stools daily. Ok to stop antibiotics day # 7 which is tomorrow  -Request CTAP, labs and discharge summary from [facility] -Diet as tolerated  -Colonoscopy in 8 weeks benefits and risks discussed including risk with sedation, risk of bleeding, perforation and infection  -Our office will contact the patient's cardiologist to verify Eliquis hold instructions prior to proceeding with a colonoscopy -Florastor probiotic one po  bid x 2 weeks  -Patient to contact office if diarrhea persists, will check C. Diff at that point  -CBC, CMP  Atrial fibrillation on Eliquis   GERD, stable on PPI every other day  CAD  Recurrent follicular lymphoma on Revlimid       CC:  Dettinger, Elige Radon, MD

## 2022-12-28 NOTE — Patient Instructions (Addendum)
You have been scheduled for a colonoscopy. Please follow written instructions given to you at your visit today.   Please pick up your prep supplies at the pharmacy within the next 1-3 days.  If you use inhalers (even only as needed), please bring them with you on the day of your procedure.  Your provider has requested that you go to the basement level for lab work before leaving today. Press "B" on the elevator. The lab is located at the first door on the left as you exit the elevator.  Florastor probiotic- take 1 by mouth twice a day for 2 weeks  Contact our office of diarrhea persists or worsens.  Due to recent changes in healthcare laws, you may see the results of your imaging and laboratory studies on MyChart before your provider has had a chance to review them.  We understand that in some cases there may be results that are confusing or concerning to you. Not all laboratory results come back in the same time frame and the provider may be waiting for multiple results in order to interpret others.  Please give [place] 48 hours in order for your provider to thoroughly review all the results before contacting the office for clarification of your results.   Thank you for trusting me with your gastrointestinal care!   Alcide Evener, CRNP

## 2022-12-28 NOTE — Telephone Encounter (Signed)
[facility] Pre-operative Risk Assessment     Request for surgical clearance:     Endoscopy Procedure  What type of surgery is being performed?     Colonoscopy  When is this surgery scheduled?     02/22/23  What type of clearance is required ?   Pharmacy  Are there any medications that need to be held prior to surgery and how long? Eliquis & 2 days  Practice name and name of physician performing surgery?      [facility]  What is your office phone and fax number?      Phone- [phone]  Fax- [phone]  Anesthesia type (None, local, MAC, general) ?       MAC   Please route your response to Arraya Buck,CMA

## 2022-12-29 ENCOUNTER — Encounter: Payer: Self-pay | Admitting: Family Medicine

## 2022-12-29 ENCOUNTER — Encounter: Payer: Self-pay | Admitting: Hematology and Oncology

## 2022-12-29 ENCOUNTER — Encounter: Payer: Self-pay | Admitting: Nurse Practitioner

## 2022-12-29 ENCOUNTER — Inpatient Hospital Stay: Payer: Medicare Other | Admitting: Nurse Practitioner

## 2022-12-29 ENCOUNTER — Ambulatory Visit: Payer: Medicare Other

## 2022-12-29 ENCOUNTER — Other Ambulatory Visit: Payer: Self-pay | Admitting: Hematology and Oncology

## 2022-12-29 VITALS — BP 119/81 | HR 64 | Ht 72.0 in | Wt 181.0 lb

## 2022-12-29 DIAGNOSIS — K529 Noninfective gastroenteritis and colitis, unspecified: Secondary | ICD-10-CM | POA: Diagnosis not present

## 2022-12-29 DIAGNOSIS — R55 Syncope and collapse: Secondary | ICD-10-CM

## 2022-12-29 NOTE — Progress Notes (Signed)
BP 119/81   Pulse 64   Ht 6' (1.829 m)   Wt 181 lb (82.1 kg)   SpO2 96%   BMI 24.55 kg/m    Subjective:   Patient ID: Anthony Kelly, male    DOB: 04/25/1945, 77 y.o.   MRN: [id number]  HPI: Anthony Kelly is a 77 y.o. male presenting on 12/29/2022 for Hospitalization Follow-up (Gi- doing much better. Finished cipro/Flagyl today. Has colon scheduled with [facility])   HPI Patient is coming in today for hospital follow-up.  He was in the hospital at [facility] on 12/20/2022-12/11.  He had a CT scan that showed colitis.  He says he was sitting on the commode at [facility] and everything started spinning around and then he fell and hit his head and left arm.  He was having loose bowels. Tonight is the last dose of cipro flagyl. Stools are still loose but no blood. No fevers or chills.  He says besides loose stools been feeling very good.  He has not had any further syncopal episodes and they think he might have had a vasovagal because it happened while he was having a bowel movement.  He had blood work done with Dr. Marvell Fuller office including a CBC and CMP that looked good yesterday.  He has planned for colonoscopy with Dr. Leone Payor. Colonoscopy is planned for 8 weeks.   Relevant past medical, surgical, family and social history reviewed and updated as indicated. Interim medical history since our last visit reviewed. Allergies and medications reviewed and updated.  Review of Systems  Constitutional:  Negative for chills and fever.  Eyes:  Negative for visual disturbance.  Respiratory:  Negative for shortness of breath and wheezing.   Cardiovascular:  Negative for chest pain and leg swelling.  Gastrointestinal:  Positive for diarrhea. Negative for abdominal pain, blood in stool, constipation, nausea and vomiting.  Musculoskeletal:  Negative for back pain and gait problem.  Skin:  Negative for rash.  Neurological:  Positive for dizziness and light-headedness.  All other  systems reviewed and are negative.   Per HPI unless specifically indicated above   Allergies as of 12/29/2022       Reactions   Rituximab Other (See Comments)   Throat tightness and facial flushing. Patient had hypersensitivty reaction to rapid rituximab. See progress note dated 10/21/2023        Medication List        Accurate as of December 29, 2022  3:28 PM. If you have any questions, ask your nurse or doctor.          STOP taking these medications    ciprofloxacin 500 MG/5ML (10%) suspension Commonly known as: CIPRO Stopped by: Elige Radon Jeromy Borcherding   metroNIDAZOLE 500 MG tablet Commonly known as: FLAGYL Stopped by: Elige Radon Rhyder Bratz       TAKE these medications    acetaminophen 500 MG tablet Commonly known as: TYLENOL Take 500-1,000 mg by mouth every 6 (six) hours as needed for moderate pain.   acyclovir 400 MG tablet Commonly known as: ZOVIRAX TAKE 1 TABLET(400 MG) BY MOUTH DAILY   BEET ROOT PO Take 1 Dose by mouth daily.   brimonidine 0.2 % ophthalmic solution Commonly known as: ALPHAGAN Place 1 drop into both eyes 2 (two) times daily.   Calcium 1200 1200-1000 MG-UNIT Chew Chew 1 tablet by mouth daily.   Eliquis 5 MG Tabs tablet Generic drug: apixaban TAKE 1 TABLET TWICE A DAY   finasteride 5 MG tablet Commonly known as:  PROSCAR Take 1 tablet (5 mg total) by mouth daily.   fluorouracil 5 % cream Commonly known as: EFUDEX Apply topically.   lenalidomide 15 MG capsule Commonly known as: Revlimid TAKE 1 CAPSULE DAILY FOR 21 DAYS ON, THEN 7 DAYS OFF, REPEAT EVERY 28 DAYS   Lumigan 0.01 % Soln Generic drug: bimatoprost Place 1 drop into both eyes at bedtime.   Magnesium 250 MG Tabs Take 1 tablet by mouth daily.   metoprolol succinate 25 MG 24 hr tablet Commonly known as: TOPROL-XL Take 0.5 tablets (12.5 mg total) by mouth daily. What changed: when to take this   mometasone 0.1 % cream Commonly known as: ELOCON Apply 1 Application  topically daily.   multivitamin with minerals Tabs tablet Take 1 tablet by mouth daily.   omeprazole 20 MG tablet Commonly known as: PRILOSEC OTC Take 20 mg by mouth every other day.   Plenvu 140 g Solr Generic drug: PEG-KCl-NaCl-NaSulf-Na Asc-C Take 1 kit by mouth as directed. Use coupon: BIN: [id number] PNC: CNRX Group: [id number] ID: [id number]   polyethylene glycol 17 g packet Commonly known as: MIRALAX / GLYCOLAX Take 8.5 g by mouth daily.   rosuvastatin 20 MG tablet Commonly known as: CRESTOR Take 1 tablet (20 mg total) by mouth daily.   SYSTANE OP Place 1 drop into both eyes daily as needed (dry eyes).   traMADol 50 MG tablet Commonly known as: Ultram Take 1 tablet (50 mg total) by mouth every 12 (twelve) hours as needed.   vitamin C 1000 MG tablet Take 1,000 mg by mouth daily.   Xiidra 5 % Soln Generic drug: Lifitegrast Place 1 drop into both eyes 2 (two) times daily.         Objective:   BP 119/81   Pulse 64   Ht 6' (1.829 m)   Wt 181 lb (82.1 kg)   SpO2 96%   BMI 24.55 kg/m   Wt Readings from Last 3 Encounters:  12/29/22 181 lb (82.1 kg)  12/28/22 180 lb (81.6 kg)  11/23/22 186 lb 9.6 oz (84.6 kg)    Physical Exam Vitals and nursing note reviewed.  Constitutional:      General: He is not in acute distress.    Appearance: He is well-developed. He is not diaphoretic.  Eyes:     General: No scleral icterus.    Conjunctiva/sclera: Conjunctivae normal.  Neck:     Thyroid: No thyromegaly.  Cardiovascular:     Rate and Rhythm: Normal rate and regular rhythm.     Heart sounds: Normal heart sounds. No murmur heard. Pulmonary:     Effort: Pulmonary effort is normal. No respiratory distress.     Breath sounds: Normal breath sounds. No wheezing.  Abdominal:     General: Abdomen is flat. Bowel sounds are normal. There is no distension.     Palpations: Abdomen is soft.     Tenderness: There is no abdominal tenderness. There is no right CVA tenderness,  left CVA tenderness, guarding or rebound.  Musculoskeletal:        General: Normal range of motion.     Cervical back: Neck supple.  Lymphadenopathy:     Cervical: No cervical adenopathy.  Skin:    General: Skin is warm and dry.     Findings: No rash.  Neurological:     Mental Status: He is alert and oriented to person, place, and time.     Coordination: Coordination normal.  Psychiatric:        Behavior:  Behavior normal.       Assessment & Plan:   Problem List Items Addressed This Visit   None Visit Diagnoses       Colitis    -  Primary     Vasovagal episode           Sounds like he is doing well.  Has not had no further episodes.  Is following up with Dr. Leone Payor his gastroenterologist for colonoscopy in 8 weeks.  No changes on our part. Follow up plan: Return if symptoms worsen or fail to improve.  Counseling provided for all of the vaccine components No orders of the defined types were placed in this encounter.   Arville Care, MD [facility] 12/29/2022, 3:28 PM

## 2022-12-30 ENCOUNTER — Encounter: Payer: Self-pay | Admitting: Cardiovascular Disease

## 2022-12-30 ENCOUNTER — Encounter: Payer: Self-pay | Admitting: Hematology and Oncology

## 2022-12-30 ENCOUNTER — Inpatient Hospital Stay: Payer: Medicare Other | Admitting: Nurse Practitioner

## 2022-12-30 ENCOUNTER — Telehealth: Payer: Self-pay

## 2022-12-30 NOTE — Telephone Encounter (Signed)
Returned call to wife. Told her to hold off on starting Revlimid until seen in the office tomorrow and assessed by Dr. Bertis Ruddy. Elijah Birk is still having diarrhea.  Kendal Hymen verbalized understanding.

## 2022-12-31 ENCOUNTER — Ambulatory Visit: Payer: Medicare Other | Admitting: Nurse Practitioner

## 2022-12-31 ENCOUNTER — Other Ambulatory Visit: Payer: Self-pay | Admitting: Hematology and Oncology

## 2022-12-31 ENCOUNTER — Encounter: Payer: Self-pay | Admitting: Hematology and Oncology

## 2022-12-31 ENCOUNTER — Other Ambulatory Visit: Payer: Medicare Other

## 2022-12-31 ENCOUNTER — Ambulatory Visit (HOSPITAL_BASED_OUTPATIENT_CLINIC_OR_DEPARTMENT_OTHER): Payer: Medicare Other | Admitting: Physician Assistant

## 2022-12-31 ENCOUNTER — Inpatient Hospital Stay: Payer: Medicare Other | Attending: Hematology and Oncology | Admitting: Hematology and Oncology

## 2022-12-31 ENCOUNTER — Inpatient Hospital Stay: Payer: Medicare Other

## 2022-12-31 ENCOUNTER — Encounter: Payer: Self-pay | Admitting: Cardiovascular Disease

## 2022-12-31 VITALS — BP 135/81 | HR 64 | Temp 97.9°F | Resp 18

## 2022-12-31 VITALS — BP 105/64 | HR 63 | Resp 18

## 2022-12-31 VITALS — BP 120/76 | HR 100 | Temp 97.5°F | Resp 18 | Ht 72.0 in | Wt 181.0 lb

## 2022-12-31 DIAGNOSIS — R197 Diarrhea, unspecified: Secondary | ICD-10-CM | POA: Diagnosis not present

## 2022-12-31 DIAGNOSIS — Z5112 Encounter for antineoplastic immunotherapy: Secondary | ICD-10-CM | POA: Insufficient documentation

## 2022-12-31 DIAGNOSIS — C8280 Other types of follicular lymphoma, unspecified site: Secondary | ICD-10-CM

## 2022-12-31 DIAGNOSIS — C8298 Follicular lymphoma, unspecified, lymph nodes of multiple sites: Secondary | ICD-10-CM | POA: Diagnosis not present

## 2022-12-31 MED ORDER — DIPHENHYDRAMINE HCL 25 MG PO CAPS
25.0000 mg | ORAL_CAPSULE | Freq: Once | ORAL | Status: AC
  Administered 2022-12-31: 25 mg via ORAL
  Filled 2022-12-31: qty 1

## 2022-12-31 MED ORDER — SODIUM CHLORIDE 0.9 % IV SOLN: Freq: Once | INTRAVENOUS | Status: AC

## 2022-12-31 MED ORDER — FAMOTIDINE 20 MG PO TABS
20.0000 mg | ORAL_TABLET | Freq: Once | ORAL | Status: AC
Start: 2022-12-31 — End: 2022-12-31
  Administered 2022-12-31: 20 mg via ORAL
  Filled 2022-12-31: qty 1

## 2022-12-31 MED ORDER — ACETAMINOPHEN 325 MG PO TABS
650.0000 mg | ORAL_TABLET | Freq: Once | ORAL | Status: AC
  Administered 2022-12-31: 650 mg via ORAL
  Filled 2022-12-31: qty 2

## 2022-12-31 MED ORDER — METHYLPREDNISOLONE SODIUM SUCC 125 MG IJ SOLR
125.0000 mg | Freq: Once | INTRAMUSCULAR | Status: AC | PRN
  Administered 2022-12-31: 80 mg via INTRAVENOUS

## 2022-12-31 MED ORDER — SODIUM CHLORIDE 0.9 % IV SOLN
375.0000 mg/m2 | Freq: Once | INTRAVENOUS | Status: AC
  Administered 2022-12-31: 800 mg via INTRAVENOUS
  Filled 2022-12-31: qty 50

## 2022-12-31 MED ORDER — SODIUM CHLORIDE 0.9 % IV SOLN: Freq: Once | INTRAVENOUS | Status: DC | PRN

## 2022-12-31 MED ORDER — METHYLPREDNISOLONE SODIUM SUCC 40 MG IJ SOLR
40.0000 mg | Freq: Once | INTRAMUSCULAR | Status: AC
Start: 2022-12-31 — End: 2022-12-31
  Administered 2022-12-31: 40 mg via INTRAVENOUS
  Filled 2022-12-31: qty 1

## 2022-12-31 MED ORDER — FAMOTIDINE IN NACL 20-0.9 MG/50ML-% IV SOLN
20.0000 mg | Freq: Once | INTRAVENOUS | Status: AC | PRN
  Administered 2022-12-31: 20 mg via INTRAVENOUS

## 2022-12-31 NOTE — Assessment & Plan Note (Signed)
CT imaging from December 2024 showed no evidence of lymphoma He will continue combination treatment of lenalidomide along with maintenance rituximab every other month until 2026 I plan to space out interval of imaging study to every 6 months, next due in June 2025

## 2022-12-31 NOTE — Progress Notes (Signed)
Pts wife noted bright red face.  Rixtuan stopped fluids started, Pepcid IVPB per emergency protocol.  Jae Dire PA at bedside

## 2022-12-31 NOTE — Progress Notes (Signed)
1730 - reports given to Rainier, Charity fundraiser. Pt has been tolerating infusion without complications since it was restarted at 1430

## 2022-12-31 NOTE — Progress Notes (Signed)
    DATE:  12/31/22                                        X CHEMO/IMMUNOTHERAPY REACTION         MD: Bertis Ruddy   AGENT/BLOOD PRODUCT RECEIVING TODAY:              rituximab-pvvr   AGENT/BLOOD PRODUCT RECEIVING IMMEDIATELY PRIOR TO REACTION:           rituximab-pvvr    Vitals:   12/31/22 1458 12/31/22 1500  BP: 109/61 105/64  Pulse: 61 63  Resp: 18 18  SpO2: 97% 96%      REACTION(S):           facial flushing   PREMEDS:     Pepcid 20 mg IV, Decadron 10 mg IV, Benadryl 25 mg PO, solu-medrol 40 mg IV tylenol 650 mg PO   INTERVENTION: Pepcid 20 mg IV, solu-medrol 80 mg   Review of Systems  Review of Systems  Skin:  Positive for color change.  All other systems reviewed and are negative.    Physical Exam  Physical Exam Vitals and nursing note reviewed.  Constitutional:      Appearance: He is not ill-appearing or toxic-appearing.     Comments: Facial flushing  HENT:     Head: Normocephalic.  Eyes:     Conjunctiva/sclera: Conjunctivae normal.  Cardiovascular:     Rate and Rhythm: Normal rate and regular rhythm.     Pulses: Normal pulses.     Heart sounds: Normal heart sounds.  Pulmonary:     Effort: Pulmonary effort is normal. No respiratory distress.     Breath sounds: Normal breath sounds. No stridor. No wheezing, rhonchi or rales.  Chest:     Chest wall: No tenderness.  Abdominal:     General: There is no distension.  Musculoskeletal:     Cervical back: Normal range of motion.  Skin:    General: Skin is warm and dry.  Neurological:     Mental Status: He is alert.     OUTCOME:                Patient became symptomatic 15 minutes into infusion. Emergency medications were administered as documented above. Patient returned to baseline. Oncologist notified and agrees to resume treatment. Patient tolerated remainder of treatment.

## 2022-12-31 NOTE — Progress Notes (Signed)
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[facility]) CT imaging from December 2024 showed no evidence of lymphoma He will continue combination treatment of lenalidomide along with maintenance rituximab every other month until 2026 I plan to space out interval of imaging study to every 6 months, next due in June 2025  Diarrhea He has diarrhea from recent antibiotics I recommend the patient to hold off restarting lenalidomide until his diarrhea resolved He will complete his current cycle with 7 more pills left and then take 7 days off before restarting another new cycle  No orders of the defined types were placed in this encounter.   All questions were answered. The patient knows to call the clinic with any problems, questions or concerns. The total time spent in the appointment was 30 minutes encounter with patients including review of chart and various tests results, discussions about plan of care and coordination of care plan   Artis Delay, MD 12/31/2022 1:01 PM  INTERVAL HISTORY: Please see below for problem oriented charting. he returns for treatment follow-up His treatment was delayed due to recent hospitalization for diverticulitis He just completed antibiotics 2 days ago He has watery diarrhea since then.  No fever, chills or abdominal cramps We discussed test results and plan for future treatment  REVIEW OF SYSTEMS:   Constitutional: Denies fevers, chills or abnormal weight loss Eyes: Denies blurriness of vision Ears, nose, mouth,  throat, and face: Denies mucositis or sore throat Respiratory: Denies cough, dyspnea or wheezes Cardiovascular: Denies palpitation, chest discomfort or lower extremity swelling Skin: Denies abnormal skin rashes Lymphatics: Denies new lymphadenopathy or easy bruising Neurological:Denies numbness, tingling or new weaknesses Behavioral/Psych: Mood is stable, no new changes  All other systems were reviewed with the patient and are negative.  I have reviewed the past medical history, past surgical history, social history and family history with the patient and they are unchanged from previous note.  ALLERGIES:  is allergic to rituximab.  MEDICATIONS:  Current Outpatient Medications  Medication Sig Dispense Refill   saccharomyces boulardii (FLORASTOR) 250 MG capsule Take 250 mg by mouth 2 (two) times daily.     acetaminophen (TYLENOL) 500 MG tablet Take 500-1,000 mg by mouth every 6 (six) hours as needed for moderate pain.     acyclovir (ZOVIRAX) 400 MG tablet TAKE 1 TABLET(400 MG) BY MOUTH DAILY 90 tablet 1   apixaban (ELIQUIS) 5 MG TABS tablet TAKE 1 TABLET TWICE A DAY 180 tablet 2   Ascorbic Acid (VITAMIN C) 1000 MG tablet Take 1,000 mg by mouth daily.     brimonidine (ALPHAGAN) 0.2 % ophthalmic solution Place 1 drop into both eyes 2 (two) times daily.      Calcium Carbonate-Vit D-Min (CALCIUM 1200) 1200-1000 MG-UNIT CHEW Chew 1 tablet by mouth daily.     finasteride (PROSCAR) 5 MG tablet Take 1 tablet (5 mg total) by mouth daily. 90 tablet 3   fluorouracil (EFUDEX) 5 % cream Apply topically.     lenalidomide (REVLIMID) 15 MG capsule TAKE 1 CAPSULE DAILY FOR 21 DAYS ON, THEN 7 DAYS OFF, REPEAT EVERY 28 DAYS 21 capsule 0  Lifitegrast (XIIDRA) 5 % SOLN Place 1 drop into both eyes 2 (two) times daily.     LUMIGAN 0.01 % SOLN Place 1 drop into both eyes at bedtime.     Magnesium 250 MG TABS Take 1 tablet by mouth daily.     metoprolol succinate (TOPROL-XL) 25 MG 24 hr tablet Take 0.5 tablets  (12.5 mg total) by mouth daily. (Patient taking differently: Take 12.5 mg by mouth 2 (two) times daily.) 90 tablet 3   Misc Natural Products (BEET ROOT PO) Take 1 Dose by mouth daily.     mometasone (ELOCON) 0.1 % cream Apply 1 Application topically daily.     Multiple Vitamin (MULTIVITAMIN WITH MINERALS) TABS tablet Take 1 tablet by mouth daily.      omeprazole (PRILOSEC OTC) 20 MG tablet Take 20 mg by mouth every other day.     PEG-KCl-NaCl-NaSulf-Na Asc-C (PLENVU) 140 g SOLR Take 1 kit by mouth as directed. Use coupon: BIN: [id number] PNC: CNRX Group: [id number] ID: [id number] 1 each 0   Polyethyl Glycol-Propyl Glycol (SYSTANE OP) Place 1 drop into both eyes daily as needed (dry eyes).     rosuvastatin (CRESTOR) 20 MG tablet Take 1 tablet (20 mg total) by mouth daily. 90 tablet 3   traMADol (ULTRAM) 50 MG tablet Take 1 tablet (50 mg total) by mouth every 12 (twelve) hours as needed. 30 tablet 0   No current facility-administered medications for this visit.    SUMMARY OF ONCOLOGIC HISTORY: Oncology History Overview Note  Low grade B-cell lymphoma   Primary site: Lymphoid Neoplasms   Staging method: AJCC 6th Edition   Clinical: Stage IV signed by Artis Delay, MD on 09/26/2013  9:15 AM   Summary: Stage IV      Follicular low grade B-cell lymphoma (HCC)  12/10/2003 Surgery   Inguinal lymph node biopsy showed follicular lymphoma.   12/12/2003 - 06/01/2004 Chemotherapy   He was treated with R. CHOP chemotherapy which show complete remission. The number of cycles of R. CHOP chemotherapy was unknown.   12/19/2006 Surgery   Lung resection show follicular lymphoma.   01/02/2007 - 09/01/2008 Chemotherapy   The patient was treated with single agent rituximab alone.   01/19/2007 Bone Marrow Biopsy   Bone marrow biopsy was negative.   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   05/08/2013 - 05/11/2013 Hospital Admission   The patient was admitted to the  hospital for management of pericarditis. CT scan showed extensive lymphadenopathy.   06/07/2013 Imaging   PET/CT scan showed extensive lymphadenopathy   06/25/2013 Procedure   He has placement of Infuse-a-Port.   06/28/2013 Bone Marrow Biopsy   Bone marrow biopsy is positive for lymphoma involvement.   07/04/2013 - 11/22/2013 Chemotherapy   He is treated with 6 cycles of bendamustine with rituximab.   09/24/2013 Imaging   Repeat PET scan show complete remission.   12/26/2013 Imaging   PEt scan showed complete remission   09/26/2014 Imaging   CT scan of the chest abdomen and pelvis show no evidence of disease   03/26/2015 Imaging   CT scan showed no evidence of lymphoma   04/14/2016 Imaging   CT: Borderline prominent right hilar and subcarinal lymph nodes, but not appreciably changed. 2. Low-grade but increased central mesenteric stranding with some small mesenteric lymph nodes. This could certainly be inflammatory, and there is no bulky adenopathy to suggest a malignant etiology.  3. Coronary and aortoiliac atherosclerotic calcification. 4. Centrilobular and paraseptal emphysema.  Postoperative findings in the right lung. 5. 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, much of which is likely a physiologic response to cholecystectomy. 7. Sigmoid colon diverticulosis. 8.  Prominent stool throughout the colon favors constipation. 9. Enlarged prostate gland, volume estimated at 75 cubic cm.   02/15/2017 Imaging   No evidence of recurrent lymphoma or other acute findings.  Stable moderate hiatal hernia.  Colonic diverticulosis, without radiographic evidence of diverticulitis.  Stable mildly enlarged prostate.  Mild emphysema.  Aortic and coronary artery atherosclerosis.   01/31/2019 Imaging   1. Interval development of abdominal and pelvic adenopathy compatible with recurrent lymphoma. 2.  Emphysema and aortic atherosclerosis. 3. Multi vessel coronary artery calcifications. 4. Moderate to large hiatal hernia   02/09/2019 Procedure   Successful CT-guided core biopsy of the left retroperitoneal periaortic adenopathy   02/09/2019 Pathology Results   SURGICAL PATHOLOGY  CASE: [id number]  PATIENT: [name]  Surgical Pathology Report   Clinical History: Lymphoma; [name], LEFT PARA AORTIC, [name], cleaved nuclei and scant cytoplasm.  By immunohistochemistry, [name], [name], [name], [name] is low (less than 10%).  Flow cytometry was attempted; however, there was insufficient material for analysis (see [id number]).  Overall, the features are consistent with relapse of the patient's previously diagnosed follicular lymphoma.  Based on the biopsy, this is favored to be a low-grade follicular lymphoma   02/19/2019 -  Chemotherapy   The patient had idelisib for chemotherapy treatment.     05/17/2019 Imaging   1. Interval generalized decrease in abdominopelvic lymphadenopathy identified on the previous study. No new or progressive lymphadenopathy on today's study. 2. Moderate hiatal hernia. 3.  Emphysema (ICD10-J43.9) and Aortic Atherosclerosis (ICD10-170.0)   11/16/2019 Imaging   IMPRESSION: Chest Impression:   1. No mediastinal lymphadenopathy. 2. Post RIGHT lung wedge resection without evidence local recurrence.   Abdomen / Pelvis  Impression:   1. Stable numerous small periaortic retroperitoneal nodes and common iliac nodes. No progression of adenopathy. 2. No splenomegaly.  No skeletal metastasis.     07/31/2020 Imaging   1. Stable examination. No significant interval change in the prominent/mildly enlarged lymph nodes below the diaphragm. No thoracic adenopathy. No splenomegaly. 2. Postsurgical change of right lung wedge resection without evidence of local recurrence. 3. Large hiatal hernia. 4. Colonic diverticulosis without findings of acute diverticulitis. 5. Aortic Atherosclerosis (ICD10-I70.0) and Emphysema (ICD10-J43.9).     01/29/2021 Imaging   1. Overall mild interval enlargement of lymph nodes in the abdomen and pelvis as described. 2. No lymphadenopathy identified in the chest. 3. Emphysematous changes of the lungs. 4. Large hiatal hernia.  Extensive colonic diverticulosis. 5. Other ancillary findings as described.     04/30/2021 Imaging   Chest Impression:   1. No mediastinal lymphadenopathy. 2. Large hiatal hernia. 3. Retrocrural node slightly decreased in size.   Abdomen / Pelvis Impression:   1. Cluster of numerous small  retroperitoneal periaortic lymph nodes. Lymph nodes are decreased slightly in size compared to recent CT scan. 2. Likewise reduction in LEFT external iliac small lymph node. 3. No evidence of progression of lymphoma. 4. Normal spleen. 5. No bone involvement.   10/28/2021 Imaging   1. Overall trend towards progressive disease on today's exam. Lymph nodes in the hepatoduodenal ligament are upper normal for size but stable in the interval. Index retroperitoneal lymph nodes are progressive, notably in the right common iliac chain. Soft tissue nodule/lymph node in the retroperitoneal fat adjacent to the right iliacus muscle is progressive. 2. Large hiatal hernia. 3. Colonic diverticulosis without diverticulitis. 4. Prostatomegaly. 5. Aortic Atherosclerosis (ICD10-I70.0) and  Emphysema (ICD10-J43.9).   03/10/2022 PET scan   Multiple intensely avid and enlarged retroperitoneal lymph nodes consistent with patient's known history of lymphoma. Of note, some of these lymph nodes demonstrate interval growth when compared to most recent abdominal CT, for reference to the node adjacent to the right psoas musculature. For purposes of assessing transformation into a more aggressive form of lymphoma, SUV max calculated using total body weight is 10.5 for the largest right iliac node and 9.8 for the soft tissue implant posterior to the right psoas muscle.  - Similar focus of increased hypermetabolic to be within the right hilar region, which may represent mediastinal involvement versus reactive etiology. Attention on follow-up examinations.    03/24/2022 Procedure   PROCEDURE: The patient was placed in the prone position. Initial CT images of the pelvis were obtained, demonstrating enlarged pelvic lymph node.   An appropriate entry site was identified and marked on the skin. The right back was prepped and draped using all elements of maximal sterile barrier technique.  Local anesthesia was achieved with lidocaine.   Under intermittent CT guidance, a 18-G coaxial needle was inserted into right pelvic lymph node.  A total of 3 cores were obtained through the cannula using spring-loaded 18-G Corvocet biopsy device.    03/24/2022 Pathology Results   A: Lymph node, pelvis, right, biopsy -  Involved by follicular lymphoma (see Comment)   Special Stains: CD5, [name] and CD20 are performed by immunohistochemistry and/or in situ hybridization in addition to flow cytometry for histopathologic and immunophenotypic correlation.   Appropriate controls for each stain have been evaluated and stain as expected.   Sections of block A1 are stained.   CD3: CD3 stain is negative in neoplastic lymphocytes. CD20: CD20 stains neoplastic lymphocytes. CD21: CD21 stains follicular dendritic cells of  enlarge neoplastic follicles. CD5: CD5 mirrors CD3 [name]: [name] stains weakly neoplastic lymphocytes. Bcl-6: Bcl-6 stains neoplastic lymphocytes. Bcl-2: Bcl-2 stains neoplastic lymphocytes.  Cyclin-D1: Cyclin-D1 stains is negative in neoplastic lymphocytes. Ki-67: Ki-67 shows low proliferation rate in neoplastic lymphocytes (10%). GMS: GMS stain is negative for microorganisms AFB: AFB stain is negative for microorganisms   04/08/2022 - 08/26/2022 Chemotherapy   Revlimid was started on 4/4 Patient is on Treatment Plan : LYMPHOMA Lenalidomide + Rituximab q28d      07/01/2022 Imaging   CT CHEST ABDOMEN PELVIS W CONTRAST  Result Date: 07/01/2022 CLINICAL DATA:  Hematologic malignancy, assess treatment response follicular B-cell lymphoma, ongoing chemotherapy * Tracking Code: BO * EXAM: CT CHEST, ABDOMEN, AND PELVIS WITH CONTRAST TECHNIQUE: Multidetector CT imaging of the chest, abdomen and pelvis was performed following the standard protocol during bolus administration of intravenous contrast. RADIATION DOSE REDUCTION: This exam was performed according to the departmental dose-optimization program which includes automated exposure control, adjustment of the mA and/or kV according to patient  size and/or use of iterative reconstruction technique. CONTRAST:  OMNIPAQUE IOHEXOL 300 MG/ML  SOLN COMPARISON:  PET-CT, 03/10/2022 FINDINGS: CT CHEST FINDINGS Cardiovascular: Aortic atherosclerosis. Normal heart size. Three-vessel coronary artery calcifications. No pericardial effusion. Mediastinum/Nodes: No enlarged mediastinal, hilar, or axillary lymph nodes. Moderate hiatal hernia with intrathoracic position of the gastric fundus. Unchanged prominent lymph nodes at the right aspect of the hiatal hernia, not previously FDG avid. Thyroid gland, trachea, and esophagus demonstrate no significant findings. Lungs/Pleura: Moderate centrilobular and paraseptal emphysema. Unchanged postoperative findings of right lower  lobe wedge resection. No pleural effusion or pneumothorax. Musculoskeletal: No chest wall abnormality. No acute osseous findings. CT ABDOMEN PELVIS FINDINGS Hepatobiliary: No focal liver abnormality is seen. Status post cholecystectomy. Unchanged postoperative biliary ductal dilatation. Pancreas: Unremarkable. No pancreatic ductal dilatation or surrounding inflammatory changes. Spleen: Normal in size without significant abnormality. Adrenals/Urinary Tract: Adrenal glands are unremarkable. Simple, benign left renal cortical cysts, for which no further follow-up or characterization is required. Kidneys are otherwise normal, without renal calculi, solid lesion, or hydronephrosis. Bladder is unremarkable. Stomach/Bowel: Stomach is within normal limits. Appendix not clearly visualized. No evidence of bowel wall thickening, distention, or inflammatory changes. Descending and sigmoid diverticulosis Vascular/Lymphatic: Aortic atherosclerosis. Interval decrease in size of previously FDG avid retroperitoneal lymph nodes, largest right common iliac node measuring 0.9 x 0.8 cm (series 2, image 73). Unchanged prominent retroperitoneal lymph nodes, not previously FDG avid (series 2, image 63). Reproductive: Prostatomegaly with median lobe hypertrophy. Other: Small fat containing right inguinal hernia. No ascites. Nearly complete resolution of a retroperitoneal soft tissue nodule overlying the right iliac crest measuring 0.7 x 0.5 cm (series 2, image 87). Musculoskeletal: No acute osseous findings. IMPRESSION: 1. Interval decrease in size of previously FDG avid retroperitoneal lymph nodes, as well as a soft tissue nodule overlying the right iliac crest. 2. Additional prominent lymph nodes adjacent to the hiatal hernia and in the retroperitoneum are unchanged, not previously FDG avid. 3. Findings are consistent with treatment response of lymphoma. 4. Emphysema. 5. Coronary artery disease. 6. Prostatomegaly. Aortic Atherosclerosis  (ICD10-I70.0) and Emphysema (ICD10-J43.9). Electronically Signed   By: Jearld Lesch M.D.   On: 07/01/2022 08:39      10/21/2022 -  Chemotherapy   Patient is on Treatment Plan : NON-HODGKINS LYMPHOMA Rituximab q60d Maintenance     12/14/2022 Imaging   CT CHEST ABDOMEN PELVIS W CONTRAST  Result Date: 12/14/2022 CLINICAL DATA:  History of follicular B-cell lymphoma, assess treatment response. * Tracking Code: BO * EXAM: CT CHEST, ABDOMEN, AND PELVIS WITH CONTRAST TECHNIQUE: Multidetector CT imaging of the chest, abdomen and pelvis was performed following the standard protocol during bolus administration of intravenous contrast. RADIATION DOSE REDUCTION: This exam was performed according to the departmental dose-optimization program which includes automated exposure control, adjustment of the mA and/or kV according to patient size and/or use of iterative reconstruction technique. CONTRAST:  OMNIPAQUE IOHEXOL 300 MG/ML  SOLN COMPARISON:  Multiple priors including most recent CT June 29, 2022 FINDINGS: CT CHEST FINDINGS Cardiovascular: Scattered aortic atherosclerosis. No central pulmonary embolus on this nondedicated study. Normal size heart. Three-vessel coronary artery calcifications. Mediastinum/Nodes: No suspicious thyroid nodule. Moderate-sized hiatal hernia No pathologically enlarged mediastinal, hilar or axillary lymph nodes. Unchanged prominent lymph nodes in the right side of the hiatal hernia for instance on image 48/2 not previously FDG avid. Lungs/Pleura: Moderate centrilobular and paraseptal emphysema. Similar postoperative findings from right lower lobe wedge resection. Scattered atelectasis/scarring. No suspicious pulmonary nodules or masses. Musculoskeletal: No aggressive  lytic or blastic lesion of bone. Stable nonenhancing fluid density nodularity extending from the neural foramina at multiple levels for instance on the right at T10-T11 bilaterally at T11-T12 and left-greater-than-right at  T12-L1, these appears similar over multiple priors and were not hypermetabolic on prior PET-CT almost certainly reflecting perineural root sleeve cysts. CT ABDOMEN PELVIS FINDINGS Hepatobiliary: Fluid signal hypodensity in the left lobe of the liver measuring 13 mm on image 53/2 is stable from prior and compatible with a benign cyst. No solid enhancing hepatic lesion. Gallbladder is unremarkable. Prominence of the biliary tree is similar over multiple prior examinations and compatible with reservoir effect post cholecystectomy. Pancreas: No pancreatic ductal dilation or evidence of acute inflammation. Spleen: No splenomegaly. Adrenals/Urinary Tract: Bilateral adrenal glands appear normal. No hydronephrosis. Stable left renal cysts measuring up to 4.6 cm. Kidneys demonstrate symmetric enhancement. Urinary bladder is unremarkable for degree of distension. Stomach/Bowel: Radiopaque enteric contrast material traverses the descending colon. Moderate-sized hiatal hernia. No pathologic dilation of small or large bowel. Colonic diverticulosis without findings of acute diverticulitis. Vascular/Lymphatic: Aortic atherosclerosis. Smooth IVC contours. The portal, splenic and superior mesenteric veins are patent. Unchanged size of the prominent retroperitoneal lymph nodes. For reference: -left periaortic lymph node at the level of the renal vein measures 5 mm on image 64/2, unchanged. -right common iliac lymph node measures 4 mm in short axis on image 89/2, unchanged. No new pathologically enlarged or enlarging lymph nodes in the abdomen or pelvis identified. 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 Other: No significant abdominopelvic free fluid. Musculoskeletal: Stable small soft tissue nodule versus lymph node in the retroperitoneal soft tissues overlying the right iliac crest on image 89/2. No aggressive lytic or blastic lesion of bone. Multilevel degenerative change of the spine. IMPRESSION: 1. Stable examination  without evidence of new or progressive disease in the chest, abdomen or pelvis. 2. Aortic Atherosclerosis (ICD10-I70.0) and Emphysema (ICD10-J43.9). Electronically Signed   By: Maudry Mayhew M.D.   On: 12/14/2022 13:43        PHYSICAL EXAMINATION: ECOG PERFORMANCE STATUS: 1 - Symptomatic but completely ambulatory  Vitals:   12/31/22 1153  BP: 120/76  Pulse: 100  Resp: 18  Temp: (!) 97.5 F (36.4 C)  SpO2: 94%   Filed Weights   12/31/22 1153  Weight: 181 lb (82.1 kg)    GENERAL:alert, no distress and comfortable NEURO: alert & oriented x 3 with fluent speech, no focal motor/sensory deficits  LABORATORY DATA:  I have reviewed the data as listed    Component Value Date/Time   NA 137 12/28/2022 1202   NA 141 10/04/2022 1059   NA 139 04/14/2016 0943   K 4.4 12/28/2022 1202   K 4.6 04/14/2016 0943   CL 106 12/28/2022 1202   CL 104 03/24/2012 1024   CO2 25 12/28/2022 1202   CO2 25 04/14/2016 0943   GLUCOSE 123 (H) 12/28/2022 1202   GLUCOSE 111 04/14/2016 0943   GLUCOSE 80 03/24/2012 1024   BUN 17 12/28/2022 1202   BUN 18 10/04/2022 1059   BUN 8.6 04/14/2016 0943   CREATININE 1.03 12/28/2022 1202   CREATININE 1.00 08/26/2022 0814   CREATININE 1.1 04/14/2016 0943   CALCIUM 8.8 12/28/2022 1202   CALCIUM 9.4 04/14/2016 0943   PROT 6.2 12/28/2022 1202   PROT 6.1 10/04/2022 1059   PROT 6.5 04/14/2016 0943   ALBUMIN 4.0 12/28/2022 1202   ALBUMIN 4.3 10/04/2022 1059   ALBUMIN 3.8 04/14/2016 0943   AST 21 12/28/2022 1202  AST 17 08/26/2022 0814   AST 28 04/14/2016 0943   ALT 20 12/28/2022 1202   ALT 16 08/26/2022 0814   ALT 41 04/14/2016 0943   ALKPHOS 54 12/28/2022 1202   ALKPHOS 71 04/14/2016 0943   BILITOT 0.5 12/28/2022 1202   BILITOT 0.6 10/04/2022 1059   BILITOT 0.5 08/26/2022 0814   BILITOT 0.61 04/14/2016 0943   GFRNONAA >60 10/21/2022 0810   GFRNONAA >60 08/26/2022 0814   GFRAA 69 09/27/2019 0801    No results found for: "SPEP", "UPEP"  Lab Results   Component Value Date   WBC 6.6 12/28/2022   NEUTROABS 3.4 12/28/2022   HGB 14.7 12/28/2022   HCT 43.4 12/28/2022   MCV 88.3 12/28/2022   PLT 205.0 12/28/2022      Chemistry      Component Value Date/Time   NA 137 12/28/2022 1202   NA 141 10/04/2022 1059   NA 139 04/14/2016 0943   K 4.4 12/28/2022 1202   K 4.6 04/14/2016 0943   CL 106 12/28/2022 1202   CL 104 03/24/2012 1024   CO2 25 12/28/2022 1202   CO2 25 04/14/2016 0943   BUN 17 12/28/2022 1202   BUN 18 10/04/2022 1059   BUN 8.6 04/14/2016 0943   CREATININE 1.03 12/28/2022 1202   CREATININE 1.00 08/26/2022 0814   CREATININE 1.1 04/14/2016 0943      Component Value Date/Time   CALCIUM 8.8 12/28/2022 1202   CALCIUM 9.4 04/14/2016 0943   ALKPHOS 54 12/28/2022 1202   ALKPHOS 71 04/14/2016 0943   AST 21 12/28/2022 1202   AST 17 08/26/2022 0814   AST 28 04/14/2016 0943   ALT 20 12/28/2022 1202   ALT 16 08/26/2022 0814   ALT 41 04/14/2016 0943   BILITOT 0.5 12/28/2022 1202   BILITOT 0.6 10/04/2022 1059   BILITOT 0.5 08/26/2022 0814   BILITOT 0.61 04/14/2016 0943       RADIOGRAPHIC STUDIES: I have personally reviewed the radiological images as listed and agreed with the findings in the report. CT CHEST ABDOMEN PELVIS W CONTRAST Result Date: 12/14/2022 CLINICAL DATA:  History of follicular B-cell lymphoma, assess treatment response. * Tracking Code: BO * EXAM: CT CHEST, ABDOMEN, AND PELVIS WITH CONTRAST TECHNIQUE: Multidetector CT imaging of the chest, abdomen and pelvis was performed following the standard protocol during bolus administration of intravenous contrast. RADIATION DOSE REDUCTION: This exam was performed according to the departmental dose-optimization program which includes automated exposure control, adjustment of the mA and/or kV according to patient size and/or use of iterative reconstruction technique. CONTRAST:  OMNIPAQUE IOHEXOL 300 MG/ML  SOLN COMPARISON:  Multiple priors including most recent  CT June 29, 2022 FINDINGS: CT CHEST FINDINGS Cardiovascular: Scattered aortic atherosclerosis. No central pulmonary embolus on this nondedicated study. Normal size heart. Three-vessel coronary artery calcifications. Mediastinum/Nodes: No suspicious thyroid nodule. Moderate-sized hiatal hernia No pathologically enlarged mediastinal, hilar or axillary lymph nodes. Unchanged prominent lymph nodes in the right side of the hiatal hernia for instance on image 48/2 not previously FDG avid. Lungs/Pleura: Moderate centrilobular and paraseptal emphysema. Similar postoperative findings from right lower lobe wedge resection. Scattered atelectasis/scarring. No suspicious pulmonary nodules or masses. Musculoskeletal: No aggressive lytic or blastic lesion of bone. 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  CT ABDOMEN PELVIS FINDINGS Hepatobiliary: Fluid signal hypodensity in the left lobe of the liver measuring 13 mm on image 53/2 is stable from prior and compatible with a benign cyst. No solid enhancing hepatic lesion. Gallbladder is unremarkable. Prominence of the biliary tree is similar over multiple prior examinations and compatible with reservoir effect post cholecystectomy. Pancreas: No pancreatic ductal dilation or evidence of acute inflammation. Spleen: No splenomegaly. Adrenals/Urinary Tract: Bilateral adrenal glands appear normal. No hydronephrosis. Stable left renal cysts measuring up to 4.6 cm. Kidneys demonstrate symmetric enhancement. Urinary bladder is unremarkable for degree of distension. Stomach/Bowel: Radiopaque enteric contrast material traverses the descending colon. Moderate-sized hiatal hernia. No pathologic  dilation of small or large bowel. Colonic diverticulosis without findings of acute diverticulitis. Vascular/Lymphatic: Aortic atherosclerosis. Smooth IVC contours. The portal, splenic and superior mesenteric veins are patent. Unchanged size of the prominent retroperitoneal lymph nodes. For reference: -left periaortic lymph node at the level of the renal vein measures 5 mm on image 64/2, unchanged. -right common iliac lymph node measures 4 mm in short axis on image 89/2, unchanged. No new pathologically enlarged or enlarging lymph nodes in the abdomen or pelvis identified. 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 Other: No significant abdominopelvic free fluid. Musculoskeletal: Stable small soft tissue nodule versus lymph node in the retroperitoneal soft tissues overlying the right iliac crest on image 89/2. No aggressive lytic or blastic lesion of bone. Multilevel degenerative change of the spine. IMPRESSION: 1. Stable examination without evidence of new or progressive disease in the chest, abdomen or pelvis. 2. Aortic Atherosclerosis (ICD10-I70.0) and Emphysema (ICD10-J43.9). Electronically Signed   By: Maudry Mayhew M.D.   On: 12/14/2022 13:43

## 2022-12-31 NOTE — Assessment & Plan Note (Signed)
He has diarrhea from recent antibiotics I recommend the patient to hold off restarting lenalidomide until his diarrhea resolved He will complete his current cycle with 7 more pills left and then take 7 days off before restarting another new cycle

## 2022-12-31 NOTE — Patient Instructions (Signed)
 [facility] - A DEPT OF [facility]  Discharge Instructions: Thank you for choosing [facility] to provide your oncology and hematology care.   If you have a lab appointment with the Cancer Center, please go directly to the Cancer Center and check in at the registration area.   Wear comfortable clothing and clothing appropriate for easy access to any Portacath or PICC line.   We strive to give you quality time with your provider. You may need to reschedule your appointment if you arrive late (15 or more minutes).  Arriving late affects you and other patients whose appointments are after yours.  Also, if you miss three or more appointments without notifying the office, you may be dismissed from the clinic at the provider's discretion.      For prescription refill requests, have your pharmacy contact our office and allow 72 hours for refills to be completed.    Today you received the following chemotherapy and/or immunotherapy agents: rituximab      To help prevent nausea and vomiting after your treatment, we encourage you to take your nausea medication as directed.  BELOW ARE SYMPTOMS THAT SHOULD BE REPORTED IMMEDIATELY: *FEVER GREATER THAN 100.4 F (38 C) OR HIGHER *CHILLS OR SWEATING *NAUSEA AND VOMITING THAT IS NOT CONTROLLED WITH YOUR NAUSEA MEDICATION *UNUSUAL SHORTNESS OF BREATH *UNUSUAL BRUISING OR BLEEDING *URINARY PROBLEMS (pain or burning when urinating, or frequent urination) *BOWEL PROBLEMS (unusual diarrhea, constipation, pain near the anus) TENDERNESS IN MOUTH AND THROAT WITH OR WITHOUT PRESENCE OF ULCERS (sore throat, sores in mouth, or a toothache) UNUSUAL RASH, SWELLING OR PAIN  UNUSUAL VAGINAL DISCHARGE OR ITCHING   Items with * indicate a potential emergency and should be followed up as soon as possible or go to the Emergency Department if any problems should occur.  Please show the CHEMOTHERAPY ALERT CARD or IMMUNOTHERAPY  ALERT CARD at check-in to the Emergency Department and triage nurse.  Should you have questions after your visit or need to cancel or reschedule your appointment, please contact [facility] - A DEPT OF [facility]  Dept: [phone]  and follow the prompts.  Office hours are 8:00 a.m. to 4:30 p.m. Monday - Friday. Please note that voicemails left after 4:00 p.m. may not be returned until the following business day.  We are closed weekends and major holidays. You have access to a nurse at all times for urgent questions. Please call the main number to the clinic Dept: [phone] and follow the prompts.   For any non-urgent questions, you may also contact your provider using MyChart. We now offer e-Visits for anyone 60 and older to request care online for non-urgent symptoms. For details visit [email or web address].   Also download the MyChart app! Go to the app store, search "MyChart", open the app, select , and log in with your MyChart username and password.

## 2023-01-03 ENCOUNTER — Telehealth: Payer: Self-pay

## 2023-01-03 NOTE — Telephone Encounter (Signed)
Contacted patient & patient is aware to hold Eliquis 2 days prior to his procedure per cardiology.

## 2023-01-03 NOTE — Telephone Encounter (Signed)
-----   Message from Artis Delay sent at 01/03/2023  7:06 AM EST ----- Please call and ask how he is doing with the rituxan reaction

## 2023-01-03 NOTE — Telephone Encounter (Signed)
.      Primary Cardiologist: Dina Rich, MD  Chart reviewed as part of pre-operative protocol coverage by clinical pharmacist.  The following recommendations were given for  Anthony Kelly     Patient with diagnosis of atrial fibrillation on Eliquis for anticoagulation.     What type of surgery is being performed?     Colonoscopy  When is this surgery scheduled?     02/22/23      CHA2DS2-VASc Score = 5   This indicates a 7.2% annual risk of stroke. The patient's score is based upon: CHF History: 0 HTN History: 1 Diabetes History: 1 Stroke History: 0 Vascular Disease History: 1 Age Score: 2 Gender Score: 0     CrCl 68 Platelet count 205   Per office protocol, patient can hold Eliquis for 2 days prior to procedure.   Patient will not need bridging with Lovenox (enoxaparin) around procedure.  Anthony Ripple. Korie Streat NP-C     01/03/2023, 2:33 PM [facility] [address] Office [phone] Fax [phone]

## 2023-01-03 NOTE — Telephone Encounter (Signed)
Called wife to follow up on how he is doing today. He is doing well with no complaints. He had facial redness with infusion Friday that resolved. He finished infusion with no problems. Anthony Kelly had a little facial redness on Saturday that went away. They will call the office for questions/concerns.  FYI

## 2023-01-03 NOTE — Telephone Encounter (Signed)
Patient with diagnosis of atrial fibrillation on Eliquis for anticoagulation.    What type of surgery is being performed?     Colonoscopy  When is this surgery scheduled?     02/22/23    CHA2DS2-VASc Score = 5   This indicates a 7.2% annual risk of stroke. The patient's score is based upon: CHF History: 0 HTN History: 1 Diabetes History: 1 Stroke History: 0 Vascular Disease History: 1 Age Score: 2 Gender Score: 0    CrCl 68 Platelet count 205  Per office protocol, patient can hold Eliquis for 2 days prior to procedure.   Patient will not need bridging with Lovenox (enoxaparin) around procedure.  **This guidance is not considered finalized until pre-operative APP has relayed final recommendations.**

## 2023-01-17 ENCOUNTER — Encounter: Payer: Self-pay | Admitting: Hematology and Oncology

## 2023-01-20 DIAGNOSIS — Z129 Encounter for screening for malignant neoplasm, site unspecified: Secondary | ICD-10-CM | POA: Diagnosis not present

## 2023-01-20 DIAGNOSIS — W908XXS Exposure to other nonionizing radiation, sequela: Secondary | ICD-10-CM | POA: Diagnosis not present

## 2023-01-20 DIAGNOSIS — L821 Other seborrheic keratosis: Secondary | ICD-10-CM | POA: Diagnosis not present

## 2023-01-20 DIAGNOSIS — Z86008 Personal history of in-situ neoplasm of other site: Secondary | ICD-10-CM | POA: Diagnosis not present

## 2023-01-20 DIAGNOSIS — L578 Other skin changes due to chronic exposure to nonionizing radiation: Secondary | ICD-10-CM | POA: Diagnosis not present

## 2023-01-20 DIAGNOSIS — D225 Melanocytic nevi of trunk: Secondary | ICD-10-CM | POA: Diagnosis not present

## 2023-01-24 ENCOUNTER — Encounter: Payer: Self-pay | Admitting: Nurse Practitioner

## 2023-01-24 ENCOUNTER — Ambulatory Visit: Payer: Medicare Other | Attending: Nurse Practitioner | Admitting: Nurse Practitioner

## 2023-01-24 VITALS — BP 124/64 | HR 74 | Ht 72.0 in | Wt 186.0 lb

## 2023-01-24 DIAGNOSIS — I2510 Atherosclerotic heart disease of native coronary artery without angina pectoris: Secondary | ICD-10-CM | POA: Diagnosis not present

## 2023-01-24 DIAGNOSIS — I10 Essential (primary) hypertension: Secondary | ICD-10-CM | POA: Diagnosis not present

## 2023-01-24 DIAGNOSIS — I480 Paroxysmal atrial fibrillation: Secondary | ICD-10-CM | POA: Insufficient documentation

## 2023-01-24 DIAGNOSIS — R29818 Other symptoms and signs involving the nervous system: Secondary | ICD-10-CM | POA: Insufficient documentation

## 2023-01-24 DIAGNOSIS — E785 Hyperlipidemia, unspecified: Secondary | ICD-10-CM | POA: Insufficient documentation

## 2023-01-24 MED ORDER — METOPROLOL SUCCINATE ER 25 MG PO TB24: 12.5000 mg | ORAL_TABLET | Freq: Two times a day (BID) | ORAL | 1 refills | Status: DC

## 2023-01-24 NOTE — Patient Instructions (Addendum)

## 2023-01-24 NOTE — Progress Notes (Signed)
 Office Visit    Patient Name: Anthony Kelly Date of Encounter: 01/24/2023 PCP:  Dettinger, Fonda LABOR, MD  [facility]  Cardiologist:  Alvan Carrier, MD  Advanced Practice Provider:  No care team member to display Electrophysiologist:  Eulas FORBES Furbish, MD   Chief Complaint and HPI    Federico Maiorino is a 78 y.o. male with a hx of PAF, CAD, NSVT, lymphoma, hypertension, and hyperlipidemia, who presents today for 1-month follow-up. Followed by EP for history of PAF.  Underwent A-fib ablation on February 12, 2022  Last seen by Dr. Carrier Alvan on March 03, 2022.  Remained in sinus rhythm.   I last saw patient for 14-month follow-up appointment on July 02, 2022.  Was overall doing well, did note some weakness/fatigue, has returned to A-fib. Denied any chest pain, shortness of breath, palpitations, syncope, presyncope, dizziness, orthopnea, PND, swelling or significant weight changes, acute bleeding, or claudication.  STOP-BANG score 5.  Metoprolol  dosage was reduced and he was referred to pulmonology for OSA evaluation.  He presents today for 56-month follow-up appointment.  Has been feeling so-so since I last saw him.  Has had some episodes of A-fib per his report, wife states that he takes an extra half tablet of metoprolol  as needed that helps with these episodes.  Says he has been closely followed by electrophysiology since I last saw him, saw Dr. Furbish on November 23, 2022, noted occasional palpitations.  Noted brief episodes of A-fib on his Apple Watch.  In November 2024 he had an episode of A-fib the last about 10 hours, maximum rates were around 120 bpm.  Repeat ablation was discussed with patient, however this was deferred due to his rare episodes of A-fib and also deferred since patient was undergoing therapy for lymphoma.   EKGs/Labs/Other Studies Reviewed:   The following studies were reviewed today:   EKG:  EKG is not ordered today.   Cardiac  monitor 08/2022: Wear time: 7 days   Sinus rhythm HR 48-132 bpm, avg 66 There were occasional episodes of SVT -- longest 1.8s  Overall burden of supraventricular ectopy: about 8% Patient triggered events correlated with frequent PACs  Cardiac monitor 11/2021 7 day monitor   Occasional supraventricular ectopy in the form of isolated PACs, couplets. Rare triplets. 54 runs of SVT longest 19.2 seconds.   Rare ventricular ectopy in the form of isolated PVCs   Reported symptoms correlated with PACs and short runs of SVT.     Patch Wear Time:  6 days and 22 hours (2023-10-10T10:01:32-0400 to 2023-10-17T08:15:09-398)   Patient had a min HR of 45 bpm, max HR of 156 bpm, and avg HR of 63 bpm. Predominant underlying rhythm was Sinus Rhythm. 54 Supraventricular Tachycardia runs occurred, the run with the fastest interval lasting 4 beats with a max rate of 156 bpm, the  longest lasting 19.2 secs with an avg rate of 119 bpm. Ectopic Atrial Rhythm was present. Isolated SVEs were occasional (4.3%, [id number]), SVE Couplets were occasional (2.1%, 6598), and SVE Triplets were rare (<1.0%, 328). Isolated VEs were rare (<1.0%), and  no VE Couplets or VE Triplets were present.   Echo 08/2020: 1. Left ventricular ejection fraction, by estimation, is 60 to 65%. The  left ventricle has normal function. The left ventricle has no regional  wall motion abnormalities. There is mild left ventricular hypertrophy.  Left ventricular diastolic parameters  were normal.   2. Right ventricular systolic function is normal. The right ventricular  size  is normal. Tricuspid regurgitation signal is inadequate for assessing  PA pressure.   3. The mitral valve is normal in structure. No evidence of mitral valve  regurgitation. No evidence of mitral stenosis.   4. The aortic valve is tricuspid. Aortic valve regurgitation is not  visualized. No aortic stenosis is present.   5. The inferior vena cava is normal in size with greater  than 50%  respiratory variability, suggesting right atrial pressure of 3 mmHg.  LHC 05/2019:  Prox RCA lesion is 25% stenosed. Ramus lesion is 50% stenosed. Prox LAD to Mid LAD lesion is 25% stenosed. Severe right subclavian tortuosity made torquing catheters difficult. If cath was needed in the future, would consider alternate access site. The left ventricular systolic function is normal. LV end diastolic pressure is normal. The left ventricular ejection fraction is 55-65% by visual estimate. There is no aortic valve stenosis.   Nonobstructive CAD.  Continue preventive therapy.   Risk Assessment/Calculations:   CHA2DS2-VASc Score = 5   This indicates a 7.2% annual risk of stroke. The patient's score is based upon: CHF History: 0 HTN History: 1 Diabetes History: 1 Stroke History: 0 Vascular Disease History: 1 Age Score: 2 Gender Score: 0   Review of Systems    All other systems reviewed and are otherwise negative except as noted above.  Physical Exam    VS:  BP 124/64   Pulse 74   Ht 6' (1.829 m)   Wt 186 lb (84.4 kg)   SpO2 95%   BMI 25.23 kg/m  , BMI Body mass index is 25.23 kg/m.  Wt Readings from Last 3 Encounters:  01/24/23 186 lb (84.4 kg)  12/31/22 181 lb (82.1 kg)  12/29/22 181 lb (82.1 kg)     GEN: Well nourished, well developed, in no acute distress. HEENT: normal. Neck: Supple, no JVD, carotid bruits, or masses. Cardiac: S1/S2, RRR, no murmurs, rubs, or gallops. No clubbing, cyanosis, edema.  Radials/PT 2+ and equal bilaterally.  Respiratory:  Respirations regular and unlabored, clear to auscultation bilaterally. GI: Soft, nontender, nondistended. MS: No deformity or atrophy. Skin: Warm and dry, no rash. Neuro:  Strength and sensation are intact. Psych: Normal affect.  Assessment & Plan    PAF Does admit to some brief episodes of A-fib since I last saw him.  Dr. Nancey is also aware of this.  Instructed him to continue current dosage of  metoprolol , wife confirms he is taking metoprolol  succinate 12.5 mg twice daily instead and daily.  Stated okay to continue at twice daily dosing, instructed that he may take an extra half tablet daily as needed for palpitations.  He verbalized understanding.  Heart rate well-controlled today. Continue follow-up with EP.  Denies any bleeding issues on Eliquis .  Continue Eliquis  5 mg twice daily, on appropriate dosage. Heart healthy diet and regular cardiovascular exercise encouraged.   CAD Stable with no anginal symptoms. No indication for ischemic evaluation.  Not on aspirin  due to being on Eliquis .  Continue current medication regimen-see patient instructions given above.  Heart healthy diet and regular cardiovascular exercise encouraged.   Hypertension Blood pressure stable and well-controlled.  Discussed to monitor BP at home at least 2 hours after medications and sitting for 5-10 minutes. Heart healthy diet and regular cardiovascular exercise encouraged.   Hyperlipidemia LDL 34 09/2022.  Currently at goal.  Continue rosuvastatin . Heart healthy diet and regular cardiovascular exercise encouraged.   5.  Suspected sleep apnea Etiology multifactorial.  STOP-BANG score 5.  Previously  referred to pulmonary for OSA evaluation, wife says patient refuses to be evaluated for OSA. Continue follow-up with PCP and Hem/Onc.  Disposition: Follow up in 6 month(s) with Alvan Carrier, MD or APP.  Signed, Almarie Crate, NP

## 2023-01-25 ENCOUNTER — Encounter: Payer: Self-pay | Admitting: Family Medicine

## 2023-01-27 ENCOUNTER — Encounter: Payer: Self-pay | Admitting: Hematology and Oncology

## 2023-01-28 ENCOUNTER — Other Ambulatory Visit: Payer: Self-pay | Admitting: *Deleted

## 2023-01-28 MED ORDER — LENALIDOMIDE 15 MG PO CAPS: ORAL_CAPSULE | ORAL | 0 refills | Status: DC

## 2023-02-01 ENCOUNTER — Encounter: Payer: Self-pay | Admitting: Internal Medicine

## 2023-02-01 NOTE — Telephone Encounter (Signed)
Pt wife Kendal Hymen was notified of Alcide Evener NP recommendations: Kendal Hymen stated that the pt had took a nap after the last episode of Emesis and woke up feeling better. Kendal Hymen stated that the pt has a lot of ondansetron at home and does not need a prescription.  Kendal Hymen was notified to call tomorrow morning tomorrow morning, to verify if he is having any N/V, abdominal pain and type of BM.  Kendal Hymen verbalized understanding with all questions answered.

## 2023-02-01 NOTE — Telephone Encounter (Signed)
Anthony Kelly, patient to go to the ED if he has any further episodes of  vomiting and/or if he is having severe abdominal pain at this time. Pls send RX for ondansetron ODT 4mg  one tab dissolve on tongue Q 8 hours PRN.   If patient does not go to the ED today, pls have him provide an update tomorrow morning, to verify if he is having any N/V, abdominal pain and type of BM. THX.  Dr. Leone Payor, Lorain Childes

## 2023-02-01 NOTE — Telephone Encounter (Signed)
Called and spoke with patient's wife who stated patient developed severe lower abdominal cramping after his breakfast this morning & vomited shortly after and felt better. He's currently resting now. She says every morning for the last week he has some discomfort after meals & it usually resolves after he has a bowel movement. Pt's wife wanted NP to be aware & asked if there was anything else they should do in the meantime. Colon is scheduled for 02/22/23. Pt last seen with Jill Side, NP 12/28/22.

## 2023-02-02 ENCOUNTER — Telehealth: Payer: Self-pay

## 2023-02-02 MED ORDER — LENALIDOMIDE 15 MG PO CAPS: ORAL_CAPSULE | ORAL | 0 refills | Status: DC

## 2023-02-02 NOTE — Telephone Encounter (Addendum)
Inbound call from patient's wife stating patient has been doing okay so far after eating breakfast. States he has not had any abdominal pain. Please advise, thank you.

## 2023-02-02 NOTE — Telephone Encounter (Signed)
Mrs. Keirsey called to inquire about [name]'s Revlimid refill. She said [facility] sent a notification that the order had been cancelled. She said [facility] refills it and asked that I call them.  Called [facility] and they stated that specialty medications should be sent directly to them. Representative from [facility] said it was cancelled because it was not eligible for mail order and had no authorization but the [employer] authorization was in the prescription.   Also advised Mrs. Slice to hold [name]'s Revlimid seven days before his colonoscopy and to resume taking it the day after his colonoscopy. She has already spoken to his PCP about holding the Eliquis.  Lorayne Marek, RN

## 2023-02-03 ENCOUNTER — Encounter: Payer: Self-pay | Admitting: Hematology and Oncology

## 2023-02-03 ENCOUNTER — Other Ambulatory Visit (HOSPITAL_COMMUNITY): Payer: Self-pay

## 2023-02-03 ENCOUNTER — Other Ambulatory Visit: Payer: Self-pay

## 2023-02-03 MED ORDER — LENALIDOMIDE 15 MG PO CAPS: ORAL_CAPSULE | ORAL | 0 refills | Status: DC

## 2023-02-05 ENCOUNTER — Other Ambulatory Visit: Payer: Self-pay | Admitting: Cardiology

## 2023-02-08 ENCOUNTER — Other Ambulatory Visit: Payer: Self-pay | Admitting: Cardiology

## 2023-02-20 ENCOUNTER — Encounter: Payer: Self-pay | Admitting: Internal Medicine

## 2023-02-21 ENCOUNTER — Telehealth: Payer: Self-pay | Admitting: Internal Medicine

## 2023-02-21 ENCOUNTER — Ambulatory Visit: Payer: Self-pay | Admitting: Family Medicine

## 2023-02-21 NOTE — Telephone Encounter (Signed)
   Chief Complaint: vomiting Symptoms: vomiting, diarrhea, chills, abdominal pain Frequency: 3am this morning Pertinent Negatives: Patient denies fever, headache Disposition: [x] ED /[] Urgent Care (no appt availability in office) / [] Appointment(In office/virtual)/ []  [facility]/ [] Home Care/ [] Refused Recommended Disposition /[] [facility]/ []  Follow-up with PCP Additional Notes: [name] reports patient has been having severe vomiting and diarrhea since this morning. Wife reports more than 12 episodes of each since 3am. Wife reports patient currently unable to keep anything down. Abdominal pain 4/10. Per protocol, advised ED. Wife reports they will go there now. Advised to call back with worsening symptoms. Wife verbalized understanding    Copied from CRM [id number]. Topic: Clinical - Red Word Triage >> Feb 21, 2023  8:08 AM [name] wrote: Red Word that prompted transfer to Nurse Triage: The patient's wife, Anthony Kelly, states that the patient is a cancer patient and woke up last night with vomiting and diarrhea. Reason for Disposition  [1] MODERATE vomiting (e.g., 3 - 5 times/day) AND [2] age > 60 years  Answer Assessment - Initial Assessment Questions 1. VOMITING SEVERITY: "How many times have you vomited in the past 24 hours?"     - MILD:  1 - 2 times/day    - MODERATE: 3 - 5 times/day, decreased oral intake without significant weight loss or symptoms of dehydration    - SEVERE: 6 or more times/day, vomits everything or nearly everything, with significant weight loss, symptoms of dehydration     severe 2. ONSET: "When did the vomiting begin?"      This morning 3. FLUIDS: "What fluids or food have you vomited up today?" "Have you been able to keep any fluids down?"     0 4. ABDOMEN PAIN: "Are your having any abdomen pain?" If Yes : "How bad is it and what does it feel like?" (e.g., crampy, dull, intermittent, constant)      Yes, 4/10 5. DIARRHEA: "Is there  any diarrhea?" If Yes, ask: "How many times today?"      As many times as vomiting, he's going at the same time 6. CONTACTS: "Is there anyone else in the family with the same symptoms?"      No, but his brother had a party this weekend where they might have been sick 7. CAUSE: "What do you think is causing your vomiting?"     Hx of cancer, but unsure what's causing this 8. HYDRATION STATUS: "Any signs of dehydration?" (e.g., dry mouth [not only dry lips], too weak to stand) "When did you last urinate?"    none 9. OTHER SYMPTOMS: "Do you have any other symptoms?" (e.g., fever, headache, vertigo, vomiting blood or coffee grounds, recent head injury)     Chills  Protocols used: Vomiting-A-AH

## 2023-02-21 NOTE — Telephone Encounter (Signed)
 error

## 2023-02-22 ENCOUNTER — Encounter: Payer: Self-pay | Admitting: Hematology and Oncology

## 2023-02-22 ENCOUNTER — Encounter: Payer: Medicare Other | Admitting: Internal Medicine

## 2023-03-01 ENCOUNTER — Inpatient Hospital Stay: Payer: Medicare Other

## 2023-03-01 ENCOUNTER — Inpatient Hospital Stay: Payer: Medicare Other | Attending: Hematology and Oncology

## 2023-03-01 ENCOUNTER — Encounter: Payer: Self-pay | Admitting: Hematology and Oncology

## 2023-03-01 ENCOUNTER — Other Ambulatory Visit: Payer: Self-pay | Admitting: Hematology and Oncology

## 2023-03-01 ENCOUNTER — Inpatient Hospital Stay (HOSPITAL_BASED_OUTPATIENT_CLINIC_OR_DEPARTMENT_OTHER): Payer: Medicare Other | Admitting: Hematology and Oncology

## 2023-03-01 VITALS — BP 123/67 | HR 76 | Temp 97.9°F | Resp 18 | Ht 72.0 in | Wt 184.6 lb

## 2023-03-01 DIAGNOSIS — C8280 Other types of follicular lymphoma, unspecified site: Secondary | ICD-10-CM

## 2023-03-01 DIAGNOSIS — Z9221 Personal history of antineoplastic chemotherapy: Secondary | ICD-10-CM | POA: Insufficient documentation

## 2023-03-01 DIAGNOSIS — C8288 Other types of follicular lymphoma, lymph nodes of multiple sites: Secondary | ICD-10-CM | POA: Diagnosis not present

## 2023-03-01 LAB — CBC WITH DIFFERENTIAL/PLATELET
Abs Immature Granulocytes: 0.05 10*3/uL (ref 0.00–0.07)
Basophils Absolute: 0.1 10*3/uL (ref 0.0–0.1)
Basophils Relative: 1 %
Eosinophils Absolute: 0.5 10*3/uL (ref 0.0–0.5)
Eosinophils Relative: 7 %
HCT: 43.5 % (ref 39.0–52.0)
Hemoglobin: 14.7 g/dL (ref 13.0–17.0)
Immature Granulocytes: 1 %
Lymphocytes Relative: 22 %
Lymphs Abs: 1.8 10*3/uL (ref 0.7–4.0)
MCH: 29.5 pg (ref 26.0–34.0)
MCHC: 33.8 g/dL (ref 30.0–36.0)
MCV: 87.3 fL (ref 80.0–100.0)
Monocytes Absolute: 0.8 10*3/uL (ref 0.1–1.0)
Monocytes Relative: 10 %
Neutro Abs: 4.7 10*3/uL (ref 1.7–7.7)
Neutrophils Relative %: 59 %
Platelets: 154 10*3/uL (ref 150–400)
RBC: 4.98 MIL/uL (ref 4.22–5.81)
RDW: 14.6 % (ref 11.5–15.5)
WBC: 7.9 10*3/uL (ref 4.0–10.5)
nRBC: 0 % (ref 0.0–0.2)

## 2023-03-01 LAB — COMPREHENSIVE METABOLIC PANEL
ALT: 21 U/L (ref 0–44)
AST: 18 U/L (ref 15–41)
Albumin: 4.1 g/dL (ref 3.5–5.0)
Alkaline Phosphatase: 62 U/L (ref 38–126)
Anion gap: 8 (ref 5–15)
BUN: 14 mg/dL (ref 8–23)
CO2: 27 mmol/L (ref 22–32)
Calcium: 9.1 mg/dL (ref 8.9–10.3)
Chloride: 105 mmol/L (ref 98–111)
Creatinine, Ser: 0.96 mg/dL (ref 0.61–1.24)
GFR, Estimated: >60 mL/min (ref >60)
Glucose, Bld: 127 mg/dL — ABNORMAL HIGH (ref 70–99)
Potassium: 3.7 mmol/L (ref 3.5–5.1)
Sodium: 140 mmol/L (ref 135–145)
Total Bilirubin: 0.6 mg/dL (ref 0.0–1.2)
Total Protein: 5.8 g/dL — ABNORMAL LOW (ref 6.5–8.1)

## 2023-03-01 NOTE — Assessment & Plan Note (Signed)
CT imaging from December 2024 showed no evidence of lymphoma He will continue combination treatment of lenalidomide  The patient had recurrent facial flushing with rituximab We discussed risk and benefits of continuing rituximab versus stopping and he agrees to stop I will see him in 2 months for monitoring while on lenalidomide I plan to space out interval of imaging study to every 6 months, next due in June 2025

## 2023-03-01 NOTE — Progress Notes (Signed)
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   All questions were answered. The patient knows to call the clinic with any problems, questions or concerns. The total time spent in the appointment was 30 minutes encounter with patients including review of chart and various tests results, discussions about plan of care and coordination of care plan   Artis Delay, MD 03/01/2023 8:52 AM  INTERVAL HISTORY: Please see below for problem oriented charting. he returns for chemo follow-up with his wife I have reviewed documentation from his last visit The patient developed significant facial flushing on rituximab He subsequently improves with additional steroids and antihistamines He has no further reoccurrence since then We reviewed findings so far and discussed risk and benefits of continuing rituximab versus stopping  REVIEW OF SYSTEMS:   Constitutional: Denies fevers, chills or abnormal weight loss Eyes: Denies blurriness of vision Ears, nose, mouth, throat, and face:  Denies mucositis or sore throat Respiratory: Denies cough, dyspnea or wheezes Cardiovascular: Denies palpitation, chest discomfort or lower extremity swelling Gastrointestinal:  Denies nausea, heartburn or change in bowel habits Skin: Denies abnormal skin rashes Lymphatics: Denies new lymphadenopathy or easy bruising Neurological:Denies numbness, tingling or new weaknesses Behavioral/Psych: Mood is stable, no new changes  All other systems were reviewed with the patient and are negative.  I have reviewed the past medical history, past surgical history, social history and family history with the patient and they are unchanged from previous note.  ALLERGIES:  is allergic to rituximab.  MEDICATIONS:  Current Outpatient Medications  Medication Sig Dispense Refill   acetaminophen (TYLENOL) 500 MG tablet Take 500-1,000 mg by mouth every 6 (six) hours as needed for moderate pain.     acyclovir (ZOVIRAX) 400 MG tablet TAKE 1 TABLET(400 MG) BY MOUTH DAILY 90 tablet 1   apixaban (ELIQUIS) 5 MG TABS tablet TAKE 1 TABLET TWICE A DAY 180 tablet 2   Ascorbic Acid (VITAMIN C) 1000 MG tablet Take 1,000 mg by mouth daily.     brimonidine (ALPHAGAN) 0.2 % ophthalmic solution Place 1 drop into both eyes 2 (two) times daily.      Calcium Carbonate-Vit D-Min (CALCIUM 1200) 1200-1000 MG-UNIT CHEW Chew 1 tablet by mouth daily.     finasteride (PROSCAR) 5 MG tablet Take 1 tablet (5 mg total) by mouth daily. 90 tablet 3   fluorouracil (EFUDEX) 5 % cream Apply topically.     lenalidomide (REVLIMID) 15 MG capsule TAKE 1 CAPSULE DAILY FOR 21 DAYS ON, THEN 7 DAYS OFF, REPEAT EVERY 28 DAYS. 21 capsule 0   Lifitegrast (XIIDRA) 5 % SOLN Place 1 drop into both eyes 2 (two)  times daily.     LUMIGAN 0.01 % SOLN Place 1 drop into both eyes at bedtime.     metoprolol succinate (TOPROL-XL) 25 MG 24 hr tablet TAKE 1/2 TABLET(12.5 MG) BY MOUTH TWICE DAILY 90 tablet 1   Misc Natural Products (BEET ROOT PO) Take 1 Dose by mouth  daily.     mometasone (ELOCON) 0.1 % cream Apply 1 Application topically daily.     Multiple Vitamin (MULTIVITAMIN WITH MINERALS) TABS tablet Take 1 tablet by mouth daily.      omeprazole (PRILOSEC OTC) 20 MG tablet Take 20 mg by mouth every other day.     Polyethyl Glycol-Propyl Glycol (SYSTANE OP) Place 1 drop into both eyes daily as needed (dry eyes).     rosuvastatin (CRESTOR) 20 MG tablet TAKE 1 TABLET(20 MG) BY MOUTH DAILY 90 tablet 1   traMADol (ULTRAM) 50 MG tablet Take 1 tablet (50 mg total) by mouth every 12 (twelve) hours as needed. 30 tablet 0   No current facility-administered medications for this visit.    SUMMARY OF ONCOLOGIC HISTORY: Oncology History Overview Note  Low grade B-cell lymphoma   Primary site: Lymphoid Neoplasms   Staging method: AJCC 6th Edition   Clinical: Stage IV signed by Artis Delay, MD on 09/26/2013  9:15 AM   Summary: Stage IV      Follicular low grade B-cell lymphoma (HCC)  12/10/2003 Surgery   Inguinal lymph node biopsy showed follicular lymphoma.   12/12/2003 - 06/01/2004 Chemotherapy   He was treated with R. CHOP chemotherapy which show complete remission. The number of cycles of R. CHOP chemotherapy was unknown.   12/19/2006 Surgery   Lung resection show follicular lymphoma.   01/02/2007 - 09/01/2008 Chemotherapy   The patient was treated with single agent rituximab alone.   01/19/2007 Bone Marrow Biopsy   Bone marrow biopsy was negative.   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   05/08/2013 - 05/11/2013 Hospital Admission   The patient was admitted to the hospital for management of pericarditis. CT scan showed extensive lymphadenopathy.   06/07/2013 Imaging   PET/CT scan showed extensive lymphadenopathy   06/25/2013 Procedure   He has placement of Infuse-a-Port.   06/28/2013 Bone Marrow Biopsy   Bone marrow biopsy is positive for lymphoma involvement.   07/04/2013 - 11/22/2013 Chemotherapy   He  is treated with 6 cycles of bendamustine with rituximab.   09/24/2013 Imaging   Repeat PET scan show complete remission.   12/26/2013 Imaging   PEt scan showed complete remission   09/26/2014 Imaging   CT scan of the chest abdomen and pelvis show no evidence of disease   03/26/2015 Imaging   CT scan showed no evidence of lymphoma   04/14/2016 Imaging   CT: Borderline prominent right hilar and subcarinal lymph nodes, but not appreciably changed. 2. Low-grade but increased central mesenteric stranding with some small mesenteric lymph nodes. This could certainly be inflammatory, 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, much of which is likely a physiologic response to cholecystectomy. 7. Sigmoid colon diverticulosis. 8.  Prominent stool throughout the colon favors constipation. 9. Enlarged prostate gland, volume estimated at 75 cubic cm.   02/15/2017 Imaging   No evidence of recurrent  lymphoma or other acute findings.  Stable moderate hiatal hernia.  Colonic diverticulosis, without radiographic evidence of diverticulitis.  Stable mildly enlarged prostate.  Mild emphysema.  Aortic and coronary artery atherosclerosis.   01/31/2019 Imaging   1. Interval development of abdominal and pelvic adenopathy compatible with recurrent lymphoma. 2. Emphysema and aortic atherosclerosis. 3. Multi vessel coronary artery calcifications. 4. Moderate to large hiatal hernia   02/09/2019 Procedure   Successful CT-guided core biopsy of the left retroperitoneal periaortic adenopathy   02/09/2019 Pathology Results   SURGICAL PATHOLOGY  CASE: [id number]  PATIENT: [name]  Surgical Pathology  Report   Clinical History: Lymphoma; [name], LEFT PARA AORTIC, [name], cleaved nuclei and scant cytoplasm.  By immunohistochemistry, [name], [name], [name], [name] is low (less than 10%).  Flow cytometry was attempted; however, there was insufficient material for analysis (see [id number]).  Overall, the features are consistent with relapse of the patient's previously diagnosed follicular lymphoma.  Based on the biopsy, this is favored to be a low-grade follicular lymphoma   02/19/2019 -  Chemotherapy   The patient had idelisib for chemotherapy treatment.     05/17/2019 Imaging   1. Interval generalized decrease in abdominopelvic lymphadenopathy identified on the previous study. No new or progressive lymphadenopathy on today's study. 2. Moderate hiatal hernia. 3.  Emphysema (ICD10-J43.9) and Aortic Atherosclerosis (ICD10-170.0)   11/16/2019 Imaging   IMPRESSION: Chest Impression:   1. No mediastinal lymphadenopathy. 2. Post RIGHT lung wedge resection without evidence local recurrence.   Abdomen / Pelvis Impression:   1. Stable numerous small periaortic retroperitoneal nodes and common iliac nodes. No progression of adenopathy. 2. No splenomegaly.  No skeletal metastasis.     07/31/2020 Imaging   1. Stable examination. No significant interval change in the prominent/mildly enlarged lymph nodes below the diaphragm. No thoracic adenopathy. No  splenomegaly. 2. Postsurgical change of right lung wedge resection without evidence of local recurrence. 3. Large hiatal hernia. 4. Colonic diverticulosis without findings of acute diverticulitis. 5. Aortic Atherosclerosis (ICD10-I70.0) and Emphysema (ICD10-J43.9).     01/29/2021 Imaging   1. Overall mild interval enlargement of lymph nodes in the abdomen and pelvis as described. 2. No lymphadenopathy identified in the chest. 3. Emphysematous changes of the lungs. 4. Large hiatal hernia.  Extensive colonic diverticulosis. 5. Other ancillary findings as described.     04/30/2021 Imaging   Chest Impression:   1. No mediastinal lymphadenopathy. 2. Large hiatal hernia. 3. Retrocrural node slightly decreased in size.   Abdomen / Pelvis Impression:   1. Cluster of numerous small retroperitoneal periaortic lymph nodes. Lymph nodes are decreased slightly in size compared to recent CT scan. 2. Likewise reduction in LEFT external iliac small lymph node. 3. No evidence of progression of lymphoma. 4. Normal spleen. 5. No bone involvement.   10/28/2021 Imaging   1. Overall trend towards progressive disease on today's exam. Lymph nodes in the hepatoduodenal ligament are upper normal for size but stable in the  interval. Index retroperitoneal lymph nodes are progressive, notably in the right common iliac chain. Soft tissue nodule/lymph node in the retroperitoneal fat adjacent to the right iliacus muscle is progressive. 2. Large hiatal hernia. 3. Colonic diverticulosis without diverticulitis. 4. Prostatomegaly. 5. Aortic Atherosclerosis (ICD10-I70.0) and Emphysema (ICD10-J43.9).   03/10/2022 PET scan   Multiple intensely avid and enlarged retroperitoneal lymph nodes consistent with patient's known history of lymphoma. Of note, some of these lymph nodes demonstrate interval growth when compared to most recent abdominal CT, for reference to the node adjacent to the right psoas musculature. For  purposes of assessing transformation into a more aggressive form of lymphoma, SUV max calculated using total body weight is 10.5 for the largest right iliac node and 9.8 for the soft tissue implant posterior to the right psoas muscle.  - Similar focus of increased hypermetabolic to be within the right hilar region, which may represent mediastinal involvement versus reactive etiology. Attention on follow-up examinations.    03/24/2022 Procedure   PROCEDURE: The patient was placed in the prone position. Initial CT images of the pelvis were obtained, demonstrating enlarged pelvic lymph node.   An appropriate entry site was identified and marked on the skin. The right back was prepped and draped using all elements of maximal sterile barrier technique.  Local anesthesia was achieved with lidocaine.   Under intermittent CT guidance, a 18-G coaxial needle was inserted into right pelvic lymph node.  A total of 3 cores were obtained through the cannula using spring-loaded 18-G Corvocet biopsy device.    03/24/2022 Pathology Results   A: Lymph node, pelvis, right, biopsy -  Involved by follicular lymphoma (see Comment)   Special Stains: CD5, [name] and CD20 are performed by immunohistochemistry and/or in situ hybridization in addition to flow cytometry for histopathologic and immunophenotypic correlation.   Appropriate controls for each stain have been evaluated and stain as expected.   Sections of block A1 are stained.   CD3: CD3 stain is negative in neoplastic lymphocytes. CD20: CD20 stains neoplastic lymphocytes. CD21: CD21 stains follicular dendritic cells of enlarge neoplastic follicles. CD5: CD5 mirrors CD3 [name]: [name] stains weakly neoplastic lymphocytes. Bcl-6: Bcl-6 stains neoplastic lymphocytes. Bcl-2: Bcl-2 stains neoplastic lymphocytes.  Cyclin-D1: Cyclin-D1 stains is negative in neoplastic lymphocytes. Ki-67: Ki-67 shows low proliferation rate in neoplastic lymphocytes (10%). GMS: GMS  stain is negative for microorganisms AFB: AFB stain is negative for microorganisms   04/08/2022 - 08/26/2022 Chemotherapy   Revlimid was started on 4/4 Patient is on Treatment Plan : LYMPHOMA Lenalidomide + Rituximab q28d      07/01/2022 Imaging   CT CHEST ABDOMEN PELVIS W CONTRAST  Result Date: 07/01/2022 CLINICAL DATA:  Hematologic malignancy, assess treatment response follicular B-cell lymphoma, ongoing chemotherapy * Tracking Code: BO * EXAM: CT CHEST, ABDOMEN, AND PELVIS WITH CONTRAST TECHNIQUE: Multidetector CT imaging of the chest, abdomen and pelvis was performed following the standard protocol during bolus administration of intravenous contrast. RADIATION DOSE REDUCTION: This exam was performed according to the departmental dose-optimization program which includes automated exposure control, adjustment of the mA and/or kV according to patient size and/or use of iterative reconstruction technique. CONTRAST:  OMNIPAQUE IOHEXOL 300 MG/ML  SOLN COMPARISON:  PET-CT, 03/10/2022 FINDINGS: CT CHEST FINDINGS Cardiovascular: Aortic atherosclerosis. Normal heart size. Three-vessel coronary artery calcifications. No pericardial effusion. Mediastinum/Nodes: No enlarged mediastinal, hilar, or axillary lymph nodes. Moderate hiatal hernia with intrathoracic position of the gastric fundus. Unchanged prominent lymph nodes at the right aspect of the hiatal hernia, not previously  FDG avid. Thyroid gland, trachea, and esophagus demonstrate no significant findings. Lungs/Pleura: Moderate centrilobular and paraseptal emphysema. Unchanged postoperative findings of right lower lobe wedge resection. No pleural effusion or pneumothorax. Musculoskeletal: No chest wall abnormality. No acute osseous findings. CT ABDOMEN PELVIS FINDINGS Hepatobiliary: No focal liver abnormality is seen. Status post cholecystectomy. Unchanged postoperative biliary ductal dilatation. Pancreas: Unremarkable. No pancreatic ductal dilatation or  surrounding inflammatory changes. Spleen: Normal in size without significant abnormality. Adrenals/Urinary Tract: Adrenal glands are unremarkable. Simple, benign left renal cortical cysts, for which no further follow-up or characterization is required. Kidneys are otherwise normal, without renal calculi, solid lesion, or hydronephrosis. Bladder is unremarkable. Stomach/Bowel: Stomach is within normal limits. Appendix not clearly visualized. No evidence of bowel wall thickening, distention, or inflammatory changes. Descending and sigmoid diverticulosis Vascular/Lymphatic: Aortic atherosclerosis. Interval decrease in size of previously FDG avid retroperitoneal lymph nodes, largest right common iliac node measuring 0.9 x 0.8 cm (series 2, image 73). Unchanged prominent retroperitoneal lymph nodes, not previously FDG avid (series 2, image 63). Reproductive: Prostatomegaly with median lobe hypertrophy. Other: Small fat containing right inguinal hernia. No ascites. Nearly complete resolution of a retroperitoneal soft tissue nodule overlying the right iliac crest measuring 0.7 x 0.5 cm (series 2, image 87). Musculoskeletal: No acute osseous findings. IMPRESSION: 1. Interval decrease in size of previously FDG avid retroperitoneal lymph nodes, as well as a soft tissue nodule overlying the right iliac crest. 2. Additional prominent lymph nodes adjacent to the hiatal hernia and in the retroperitoneum are unchanged, not previously FDG avid. 3. Findings are consistent with treatment response of lymphoma. 4. Emphysema. 5. Coronary artery disease. 6. Prostatomegaly. Aortic Atherosclerosis (ICD10-I70.0) and Emphysema (ICD10-J43.9). Electronically Signed   By: Jearld Lesch M.D.   On: 07/01/2022 08:39      10/21/2022 - 12/31/2022 Chemotherapy   Patient is on Treatment Plan : NON-HODGKINS LYMPHOMA Rituximab q60d Maintenance     12/14/2022 Imaging   CT CHEST ABDOMEN PELVIS W CONTRAST  Result Date: 12/14/2022 CLINICAL DATA:   History of follicular B-cell lymphoma, assess treatment response. * Tracking Code: BO * EXAM: CT CHEST, ABDOMEN, AND PELVIS WITH CONTRAST TECHNIQUE: Multidetector CT imaging of the chest, abdomen and pelvis was performed following the standard protocol during bolus administration of intravenous contrast. RADIATION DOSE REDUCTION: This exam was performed according to the departmental dose-optimization program which includes automated exposure control, adjustment of the mA and/or kV according to patient size and/or use of iterative reconstruction technique. CONTRAST:  OMNIPAQUE IOHEXOL 300 MG/ML  SOLN COMPARISON:  Multiple priors including most recent CT June 29, 2022 FINDINGS: CT CHEST FINDINGS Cardiovascular: Scattered aortic atherosclerosis. No central pulmonary embolus on this nondedicated study. Normal size heart. Three-vessel coronary artery calcifications. Mediastinum/Nodes: No suspicious thyroid nodule. Moderate-sized hiatal hernia No pathologically enlarged mediastinal, hilar or axillary lymph nodes. Unchanged prominent lymph nodes in the right side of the hiatal hernia for instance on image 48/2 not previously FDG avid. Lungs/Pleura: Moderate centrilobular and paraseptal emphysema. Similar postoperative findings from right lower lobe wedge resection. Scattered atelectasis/scarring. No suspicious pulmonary nodules or masses. Musculoskeletal: No aggressive lytic or blastic lesion of bone. 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 CT ABDOMEN PELVIS FINDINGS Hepatobiliary: Fluid signal hypodensity in the left lobe of the liver measuring 13  mm on image 53/2 is stable from prior and compatible with a benign cyst.  No solid enhancing hepatic lesion. Gallbladder is unremarkable. Prominence of the biliary tree is similar over multiple prior examinations and compatible with reservoir effect post cholecystectomy. Pancreas: No pancreatic ductal dilation or evidence of acute inflammation. Spleen: No splenomegaly. Adrenals/Urinary Tract: Bilateral adrenal glands appear normal. No hydronephrosis. Stable left renal cysts measuring up to 4.6 cm. Kidneys demonstrate symmetric enhancement. Urinary bladder is unremarkable for degree of distension. Stomach/Bowel: Radiopaque enteric contrast material traverses the descending colon. Moderate-sized hiatal hernia. No pathologic dilation of small or large bowel. Colonic diverticulosis without findings of acute diverticulitis. Vascular/Lymphatic: Aortic atherosclerosis. Smooth IVC contours. The portal, splenic and superior mesenteric veins are patent. Unchanged size of the prominent retroperitoneal lymph nodes. For reference: -left periaortic lymph node at the level of the renal vein measures 5 mm on image 64/2, unchanged. -right common iliac lymph node measures 4 mm in short axis on image 89/2, unchanged. No new pathologically enlarged or enlarging lymph nodes in the abdomen or pelvis identified. 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 Other: No significant abdominopelvic free fluid. Musculoskeletal: Stable small soft tissue nodule versus lymph node in the retroperitoneal soft tissues overlying the right iliac crest on image 89/2. No aggressive lytic or blastic lesion of bone. Multilevel degenerative change of the spine. IMPRESSION: 1. Stable examination without evidence of new or progressive disease in the chest, abdomen or pelvis. 2. Aortic Atherosclerosis (ICD10-I70.0) and Emphysema (ICD10-J43.9). Electronically Signed   By: Maudry Mayhew M.D.   On: 12/14/2022 13:43        PHYSICAL EXAMINATION: ECOG PERFORMANCE STATUS: 0 - Asymptomatic  Vitals:   03/01/23 0821  BP: 123/67   Pulse: 76  Resp: 18  Temp: 97.9 F (36.6 C)  SpO2: 99%   Filed Weights   03/01/23 0821  Weight: 184 lb 9.6 oz (83.7 kg)    GENERAL:alert, no distress and comfortable LABORATORY DATA:  I have reviewed the data as listed    Component Value Date/Time   NA 140 03/01/2023 0757   NA 141 10/04/2022 1059   NA 139 04/14/2016 0943   K 3.7 03/01/2023 0757   K 4.6 04/14/2016 0943   CL 105 03/01/2023 0757   CL 104 03/24/2012 1024   CO2 27 03/01/2023 0757   CO2 25 04/14/2016 0943   GLUCOSE 127 (H) 03/01/2023 0757   GLUCOSE 111 04/14/2016 0943   GLUCOSE 80 03/24/2012 1024   BUN 14 03/01/2023 0757   BUN 18 10/04/2022 1059   BUN 8.6 04/14/2016 0943   CREATININE 0.96 03/01/2023 0757   CREATININE 1.00 08/26/2022 0814   CREATININE 1.1 04/14/2016 0943   CALCIUM 9.1 03/01/2023 0757   CALCIUM 9.4 04/14/2016 0943   PROT 5.8 (L) 03/01/2023 0757   PROT 6.1 10/04/2022 1059   PROT 6.5 04/14/2016 0943   ALBUMIN 4.1 03/01/2023 0757   ALBUMIN 4.3 10/04/2022 1059   ALBUMIN 3.8 04/14/2016 0943   AST 18 03/01/2023 0757   AST 17 08/26/2022 0814   AST 28 04/14/2016 0943   ALT 21 03/01/2023 0757   ALT 16 08/26/2022 0814   ALT 41 04/14/2016 0943   ALKPHOS 62 03/01/2023 0757   ALKPHOS 71 04/14/2016 0943   BILITOT 0.6 03/01/2023 0757   BILITOT 0.6 10/04/2022 1059   BILITOT 0.5 08/26/2022 0814   BILITOT 0.61 04/14/2016 0943   GFRNONAA >60 03/01/2023 0757   GFRNONAA >60 08/26/2022 0814   GFRAA 69 09/27/2019 0801  No results found for: "SPEP", "UPEP"  Lab Results  Component Value Date   WBC 7.9 03/01/2023   NEUTROABS 4.7 03/01/2023   HGB 14.7 03/01/2023   HCT 43.5 03/01/2023   MCV 87.3 03/01/2023   PLT 154 03/01/2023      Chemistry      Component Value Date/Time   NA 140 03/01/2023 0757   NA 141 10/04/2022 1059   NA 139 04/14/2016 0943   K 3.7 03/01/2023 0757   K 4.6 04/14/2016 0943   CL 105 03/01/2023 0757   CL 104 03/24/2012 1024   CO2 27 03/01/2023 0757   CO2 25  04/14/2016 0943   BUN 14 03/01/2023 0757   BUN 18 10/04/2022 1059   BUN 8.6 04/14/2016 0943   CREATININE 0.96 03/01/2023 0757   CREATININE 1.00 08/26/2022 0814   CREATININE 1.1 04/14/2016 0943      Component Value Date/Time   CALCIUM 9.1 03/01/2023 0757   CALCIUM 9.4 04/14/2016 0943   ALKPHOS 62 03/01/2023 0757   ALKPHOS 71 04/14/2016 0943   AST 18 03/01/2023 0757   AST 17 08/26/2022 0814   AST 28 04/14/2016 0943   ALT 21 03/01/2023 0757   ALT 16 08/26/2022 0814   ALT 41 04/14/2016 0943   BILITOT 0.6 03/01/2023 0757   BILITOT 0.6 10/04/2022 1059   BILITOT 0.5 08/26/2022 0814   BILITOT 0.61 04/14/2016 0943

## 2023-03-03 ENCOUNTER — Telehealth: Payer: Self-pay | Admitting: Hematology and Oncology

## 2023-03-03 NOTE — Telephone Encounter (Signed)
 Spoke with patient confirming upcoming appointment

## 2023-03-15 ENCOUNTER — Ambulatory Visit (AMBULATORY_SURGERY_CENTER): Payer: Medicare Other | Admitting: *Deleted

## 2023-03-15 VITALS — Ht 72.0 in | Wt 183.6 lb

## 2023-03-15 DIAGNOSIS — K529 Noninfective gastroenteritis and colitis, unspecified: Secondary | ICD-10-CM

## 2023-03-15 NOTE — Addendum Note (Signed)
 Addended by: Karl Bales B on: 03/15/2023 10:54 AM   Modules accepted: Level of Service

## 2023-03-15 NOTE — Progress Notes (Signed)
 No egg or soy allergy known to patient  No issues known to pt with past sedation with any surgeries or procedures Patient denies ever being told they had issues or difficulty with intubation  No FH of Malignant Hyperthermia Pt is not on diet pills Pt is not on  home 02  Pt had Plenvu prep at home Pt denies issues with constipation  Pt is not on dialysis Pt denies any upcoming cardiac testing Pt encouraged to use to use Singlecare or Goodrx to reduce cost  Pt and wife reminded to arrive at 10:30 am for procedure- even if they receive call or message otherwise.  Understanding voiced  Patient's chart reviewed by Cathlyn Parsons CNRA prior to previsit and patient appropriate for the [facility].  Previsit completed and red dot placed by patient's name on their procedure day (on provider's schedule).

## 2023-03-20 ENCOUNTER — Other Ambulatory Visit: Payer: Self-pay | Admitting: Hematology and Oncology

## 2023-03-20 DIAGNOSIS — C8280 Other types of follicular lymphoma, unspecified site: Secondary | ICD-10-CM

## 2023-03-21 ENCOUNTER — Other Ambulatory Visit: Payer: Self-pay

## 2023-03-21 MED ORDER — FINASTERIDE 5 MG PO TABS: 5.0000 mg | ORAL_TABLET | Freq: Every day | ORAL | 3 refills | Status: DC

## 2023-03-30 ENCOUNTER — Other Ambulatory Visit: Payer: Self-pay | Admitting: Hematology and Oncology

## 2023-03-30 NOTE — Telephone Encounter (Signed)
Pls refill electronically °

## 2023-03-31 ENCOUNTER — Encounter: Payer: Self-pay | Admitting: Family Medicine

## 2023-03-31 ENCOUNTER — Ambulatory Visit: Payer: Medicare Other | Admitting: Family Medicine

## 2023-03-31 VITALS — BP 104/69 | HR 59 | Ht 72.0 in | Wt 181.0 lb

## 2023-03-31 DIAGNOSIS — I480 Paroxysmal atrial fibrillation: Secondary | ICD-10-CM

## 2023-03-31 DIAGNOSIS — M51369 Other intervertebral disc degeneration, lumbar region without mention of lumbar back pain or lower extremity pain: Secondary | ICD-10-CM

## 2023-03-31 DIAGNOSIS — Z79899 Other long term (current) drug therapy: Secondary | ICD-10-CM

## 2023-03-31 DIAGNOSIS — R7303 Prediabetes: Secondary | ICD-10-CM

## 2023-03-31 DIAGNOSIS — E782 Mixed hyperlipidemia: Secondary | ICD-10-CM

## 2023-03-31 LAB — BAYER DCA HB A1C WAIVED: HB A1C (BAYER DCA - WAIVED): 6.1 % — ABNORMAL HIGH (ref 4.8–5.6)

## 2023-03-31 LAB — LIPID PANEL

## 2023-03-31 MED ORDER — TRAMADOL HCL 50 MG PO TABS: 100.0000 mg | ORAL_TABLET | Freq: Two times a day (BID) | ORAL | 0 refills | Status: DC | PRN

## 2023-03-31 NOTE — Progress Notes (Signed)
 BP 104/69   Pulse (!) 59   Ht 6' (1.829 m)   Wt 181 lb (82.1 kg)   SpO2 98%   BMI 24.55 kg/m    Subjective:   Patient ID: Anthony Kelly, male    DOB: 06-20-1945, 78 y.o.   MRN: [id number]  HPI: Anthony Kelly is a 78 y.o. male presenting on 03/31/2023 for Medical Management of Chronic Issues, Hyperlipidemia, and Prediabetes   HPI Hyperlipidemia Patient is coming in for recheck of his hyperlipidemia. The patient is currently taking Crestor. They deny any issues with myalgias or history of liver damage from it. They deny any focal numbness or weakness or chest pain.   Prediabetes Patient comes in today for recheck of his diabetes. Patient has been currently taking no medicine, diet control. Patient is not currently on an ACE inhibitor/ARB. Patient has not seen an ophthalmologist this year. Patient denies any new issues with their feet. The symptom started onset as an adult hyperlipidemia and A-fib ARE RELATED TO DM   A-fib Patient is currently taking metoprolol and Eliquis for A-fib.  Sees cardiology when heart rate gets up he takes an extra metoprolol.  He keeps of the heart rate watch monitor and that helps some be alerted when it is too high.  Pain assessment: Cause of pain-arthritis in back and legs Pain location-lower back and legs Pain on scale of 1-10- 2 Frequency-most days but some days are worse What increases pain-certain whether certain activities cause him to be worse in What makes pain Better-rest and heating pad Effects on ADL -minimal Any change in general medical condition-none  Current opioids rx-tramadol 100 mg twice daily as needed # meds rx-60 Effectiveness of current meds-was not working at lower dose so increased it to 100 Adverse reactions from pain meds-none Morphine equivalent-20  Pill count performed-No Last drug screen -N/A ( high risk q45m, moderate risk q57m, low risk yearly ) Urine drug screen today- Yes Was the [facility] reviewed-yes  If yes were  their any concerning findings? -None Pain contract signed on: Today  Relevant past medical, surgical, family and social history reviewed and updated as indicated. Interim medical history since our last visit reviewed. Allergies and medications reviewed and updated.  Review of Systems  Constitutional:  Negative for chills and fever.  Eyes:  Negative for discharge.  Respiratory:  Negative for shortness of breath and wheezing.   Cardiovascular:  Negative for chest pain, palpitations and leg swelling.  Musculoskeletal:  Positive for back pain. Negative for gait problem.  Skin:  Negative for rash.  All other systems reviewed and are negative.   Per HPI unless specifically indicated above   Allergies as of 03/31/2023       Reactions   Rituximab Other (See Comments)   Throat tightness and facial flushing. Patient had hypersensitivty reaction to rapid rituximab. See progress note dated 10/21/2023        Medication List        Accurate as of March 31, 2023 10:03 AM. If you have any questions, ask your nurse or doctor.          acetaminophen 500 MG tablet Commonly known as: TYLENOL Take 500-1,000 mg by mouth every 6 (six) hours as needed for moderate pain.   acyclovir 400 MG tablet Commonly known as: ZOVIRAX TAKE 1 TABLET(400 MG) BY MOUTH DAILY   BEET ROOT PO Take 1 Dose by mouth daily.   brimonidine 0.2 % ophthalmic solution Commonly known as: ALPHAGAN Place 1 drop into  both eyes 2 (two) times daily.   Calcium 1200 1200-1000 MG-UNIT Chew Chew 1 tablet by mouth daily.   cyclobenzaprine 5 MG tablet Commonly known as: FLEXERIL Take 5 mg by mouth 3 (three) times daily as needed for muscle spasms.   Eliquis 5 MG Tabs tablet Generic drug: apixaban TAKE 1 TABLET TWICE A DAY   finasteride 5 MG tablet Commonly known as: PROSCAR Take 1 tablet (5 mg total) by mouth daily.   fluorouracil 5 % cream Commonly known as: EFUDEX Apply topically.   lenalidomide 15 MG  capsule Commonly known as: Revlimid TAKE 1 CAPSULE DAILY FOR 21 DAYS ON THEN 7 DAYS OFF, REPEAT EVERY 28 DAYS.   Lumigan 0.01 % Soln Generic drug: bimatoprost Place 1 drop into both eyes at bedtime.   meclizine 25 MG tablet Commonly known as: ANTIVERT Take 25 mg by mouth 3 (three) times daily as needed for dizziness.   metoprolol succinate 25 MG 24 hr tablet Commonly known as: TOPROL-XL TAKE 1/2 TABLET(12.5 MG) BY MOUTH TWICE DAILY   mometasone 0.1 % cream Commonly known as: ELOCON Apply 1 Application topically daily.   multivitamin with minerals Tabs tablet Take 1 tablet by mouth daily.   omeprazole 20 MG tablet Commonly known as: PRILOSEC OTC Take 20 mg by mouth every other day.   ondansetron 8 MG tablet Commonly known as: ZOFRAN Take by mouth every 8 (eight) hours as needed for nausea or vomiting.   prochlorperazine 10 MG tablet Commonly known as: COMPAZINE Take 10 mg by mouth every 6 (six) hours as needed for nausea or vomiting.   rosuvastatin 20 MG tablet Commonly known as: CRESTOR TAKE 1 TABLET(20 MG) BY MOUTH DAILY   SYSTANE OP Place 1 drop into both eyes daily as needed (dry eyes).   traMADol 50 MG tablet Commonly known as: Ultram Take 2 tablets (100 mg total) by mouth every 12 (twelve) hours as needed. What changed: how much to take Changed by: Elige Radon Neal Oshea   vitamin C 1000 MG tablet Take 1,000 mg by mouth daily.   Xiidra 5 % Soln Generic drug: Lifitegrast Place 1 drop into both eyes 2 (two) times daily.         Objective:   BP 104/69   Pulse (!) 59   Ht 6' (1.829 m)   Wt 181 lb (82.1 kg)   SpO2 98%   BMI 24.55 kg/m   Wt Readings from Last 3 Encounters:  03/31/23 181 lb (82.1 kg)  03/15/23 183 lb 9.6 oz (83.3 kg)  03/01/23 184 lb 9.6 oz (83.7 kg)    Physical Exam Vitals and nursing note reviewed.  Constitutional:      General: He is not in acute distress.    Appearance: He is well-developed. He is not diaphoretic.  Eyes:      General: No scleral icterus.    Conjunctiva/sclera: Conjunctivae normal.  Neck:     Thyroid: No thyromegaly.  Cardiovascular:     Rate and Rhythm: Normal rate. Rhythm irregularly irregular.     Heart sounds: Normal heart sounds. No murmur heard. Pulmonary:     Effort: Pulmonary effort is normal. No respiratory distress.     Breath sounds: Normal breath sounds. No wheezing.  Musculoskeletal:        General: Normal range of motion.     Cervical back: Neck supple.  Lymphadenopathy:     Cervical: No cervical adenopathy.  Skin:    General: Skin is warm and dry.  Findings: No rash.  Neurological:     Mental Status: He is alert and oriented to person, place, and time.     Coordination: Coordination normal.  Psychiatric:        Behavior: Behavior normal.       Assessment & Plan:   Problem List Items Addressed This Visit       Cardiovascular and Mediastinum   PAF (paroxysmal atrial fibrillation) (HCC) (Chronic)     Musculoskeletal and Integument   DDD (degenerative disc disease), lumbar   Relevant Medications   traMADol (ULTRAM) 50 MG tablet     Other   Hyperlipidemia   Relevant Orders   CMP14+EGFR   Lipid panel   Prediabetes - Primary   Relevant Orders   Bayer DCA Hb A1c Waived   Microalbumin/Creatinine Ratio, Urine   CMP14+EGFR   Other Visit Diagnoses       Controlled substance agreement signed       Relevant Orders   ToxASSURE Select 13 (MW), Urine     Continue current medicine, seems to be doing well, will check his blood work on the way out. For pain increase his tramadol because it was not helping as much.  Follow up plan: Return in about 6 months (around 10/01/2023), or if symptoms worsen or fail to improve, for Prediabetes and back pain and cholesterol..  Counseling provided for all of the vaccine components Orders Placed This Encounter  Procedures   Bayer DCA Hb A1c Waived   ToxASSURE Select 13 (MW), Urine   Microalbumin/Creatinine Ratio,  Urine   CMP14+EGFR   Lipid panel    Arville Care, MD [place] [facility] 03/31/2023, 10:03 AM

## 2023-04-01 LAB — LIPID PANEL
Cholesterol, Total: 101 mg/dL (ref 100–199)
HDL: 48 mg/dL (ref >39)
LDL CALC COMMENT:: 2.1 ratio (ref 0.0–5.0)
LDL Chol Calc (NIH): 34 mg/dL (ref 0–99)
Triglycerides: 104 mg/dL (ref 0–149)
VLDL Cholesterol Cal: 19 mg/dL (ref 5–40)

## 2023-04-01 LAB — CMP14+EGFR
ALT: 18 IU/L (ref 0–44)
AST: 22 IU/L (ref 0–40)
Albumin: 4.2 g/dL (ref 3.8–4.8)
Alkaline Phosphatase: 71 IU/L (ref 44–121)
BUN/Creatinine Ratio: 16 (ref 10–24)
BUN: 16 mg/dL (ref 8–27)
Bilirubin Total: 0.8 mg/dL (ref 0.0–1.2)
CO2: 22 mmol/L (ref 20–29)
Calcium: 9.3 mg/dL (ref 8.6–10.2)
Chloride: 101 mmol/L (ref 96–106)
Creatinine, Ser: 1.03 mg/dL (ref 0.76–1.27)
Globulin, Total: 1.7 g/dL (ref 1.5–4.5)
Glucose: 127 mg/dL — ABNORMAL HIGH (ref 70–99)
Potassium: 4.8 mmol/L (ref 3.5–5.2)
Sodium: 139 mmol/L (ref 134–144)
Total Protein: 5.9 g/dL — ABNORMAL LOW (ref 6.0–8.5)
eGFR: 74 mL/min/{1.73_m2} (ref >59)

## 2023-04-01 LAB — MICROALBUMIN / CREATININE URINE RATIO
Creatinine, Urine: 52.3 mg/dL
Microalb/Creat Ratio: <6 mg/g{creat} (ref 0–29)
Microalbumin, Urine: <3 ug/mL

## 2023-04-04 LAB — TOXASSURE SELECT 13 (MW), URINE

## 2023-04-05 NOTE — Progress Notes (Unsigned)
 [facility] History and Physical   Primary Care Physician:  Dettinger, Elige Radon, MD   Reason for Procedure:  Colitis and time for screening colonoscopy  Plan:    Colonoscopy     HPI: Anthony Kelly is a 78 y.o. male seen in the clinic by Alcide Evener, NP in December 2024 because of a recent episode of colitis. 78 year old male recently admitted to [facility] with acute colitis, presumed infectious colitis treated with IV antibiotics then oral Cipro and Flagyl. Patient passing 1 to 2 loose stools daily. Ok to stop antibiotics day # 7 which is tomorrow   -Diet as tolerated  -Colonoscopy in 8 weeks benefits and risks discussed including risk with sedation, risk of bleeding, perforation and infection  -Our office will contact the patient's cardiologist to verify Eliquis hold instructions prior to proceeding with a colonoscopy -Florastor probiotic one po  bid x 2 weeks  -Patient to contact office if diarrhea persists, will check C. 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 Leone Payor  1) 8-10 mm semi-pedunculated distal sigmoid polyp removed with snare cautery and sent to pathology  2) severe sigmoid diverticulosis  3) Otherwise normal colonoscopy, good prep - LIPOMA. NO ADENOMATOUS CHANGE OR EVIDENCE OF MALIGNANCY. 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 04/2013 -adm with CP, elevated CRP. H/o coronary artery calcification on prior CT but nuc was negative, EF 71%.   Arthritis    Atrial fibrillation (HCC)    on Metoprolol and Eliquis for this  a. Isolated episode in the setting of acute pericarditis 04/2013. Was not placed on anticoag.   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   Metastasis to lung (HCC) dx'd 01/2007   Metastasis to lymph nodes (HCC) dx'd 03/2011   lt submandibular ln   Pain in joint, pelvic region and thigh 08/01/2013   PSVT (paroxysmal supraventricular tachycardia) (HCC)    SCC (squamous cell carcinoma)    Left cheek    Past Surgical History:  Procedure Laterality Date   ATRIAL FIBRILLATION ABLATION N/A 02/17/2022   Procedure: ATRIAL FIBRILLATION ABLATION;  Surgeon: Maurice Small, MD;  Location: [facility] INVASIVE CV LAB;  Service: Cardiovascular;  Laterality: N/A;   CHOLECYSTECTOMY     CYSTOSCOPY N/A 09/09/2022   Procedure: CYSTOSCOPY;  Surgeon: Malen Gauze, MD;  Location: [facility] ORS;  Service: Urology;  Laterality: N/A;   EXPLORATORY LAPAROTOMY     EYE SURGERY Right 2016   cataract   LEFT HEART CATH AND CORONARY ANGIOGRAPHY N/A 06/05/2019   Procedure: LEFT HEART CATH AND CORONARY ANGIOGRAPHY;  Surgeon: Corky Crafts, MD;  Location: [facility] INVASIVE CV LAB;  Service:  Cardiovascular;  Laterality: N/A;   LUNG LOBECTOMY     right side   LYMPH NODE BIOPSY     in groin with removal   MENISCUS REPAIR     right knee   MOHS SURGERY Left 09/2020   cheek   PROSTATE SURGERY     REFRACTIVE SURGERY Right    piece of metal removed   SUBMANDIBULAR GLAND EXCISION  04/2011   SUBMANDIBULAR GLAND EXCISION  04/30/2011   Procedure: EXCISION SUBMANDIBULAR GLAND;  Surgeon: Osborn Coho, MD;  Location: [facility] OR;  Service: ENT;  Laterality: Left;  WITH DIAGNOSTIC BIOPSY    TONSILLECTOMY     as a child   TRANSURETHRAL RESECTION OF PROSTATE N/A 09/09/2022   Procedure: TRANSURETHRAL RESECTION OF THE PROSTATE (TURP);  Surgeon: Malen Gauze, MD;  Location: [facility] ORS;  Service: Urology;  Laterality: N/A;   VEIN LIGATION AND STRIPPING     right leg    Prior to Admission medications   Medication Sig Start Date End Date Taking? Authorizing Provider  acetaminophen (TYLENOL) 500 MG tablet Take 500-1,000 mg by mouth every 6 (six) hours as needed for moderate pain.    [provider]  acyclovir (ZOVIRAX) 400 MG tablet TAKE 1 TABLET(400 MG) BY MOUTH DAILY 03/20/23   Artis Delay, MD  apixaban (ELIQUIS) 5 MG TABS tablet TAKE 1 TABLET TWICE A DAY 07/16/22   Antoine Poche, MD  Ascorbic Acid (VITAMIN C) 1000 MG tablet Take 1,000 mg by mouth daily. 01/12/21   [provider]  brimonidine (ALPHAGAN) 0.2 % ophthalmic solution Place 1 drop into both eyes 2 (two) times daily.  02/08/19   [provider]  Calcium Carbonate-Vit D-Min (CALCIUM 1200) 1200-1000 MG-UNIT CHEW Chew 1 tablet by mouth daily.    [provider]  cyclobenzaprine (FLEXERIL) 5 MG tablet Take 5 mg by mouth 3 (three) times daily as needed for muscle spasms.    [provider]  finasteride (PROSCAR) 5 MG tablet Take 1 tablet (5 mg total) by mouth daily. 03/21/23   McKenzie, Mardene Celeste, MD  fluorouracil (EFUDEX) 5 % cream Apply topically. 11/09/22   [provider]  lenalidomide (REVLIMID) 15 MG capsule TAKE 1 CAPSULE DAILY FOR 21 DAYS ON THEN 7 DAYS OFF, REPEAT EVERY 28 DAYS. 03/30/23   Artis Delay, MD  Lifitegrast Benay Spice) 5 % SOLN Place 1 drop into both eyes 2 (two) times daily.    [provider]  LUMIGAN 0.01 % SOLN Place 1 drop into both eyes at bedtime. 03/07/17   [provider]  meclizine (ANTIVERT) 25 MG tablet Take 25 mg by mouth 3 (three) times daily as needed for dizziness.    [provider]  metoprolol succinate (TOPROL-XL) 25  MG 24 hr tablet TAKE 1/2 TABLET(12.5 MG) BY MOUTH TWICE DAILY 02/09/23   Antoine Poche, MD  Misc Natural Products (BEET ROOT PO) Take 1 Dose by mouth daily.    [provider]  mometasone (ELOCON) 0.1 % cream Apply 1 Application topically daily. 10/09/22   [provider]  Multiple Vitamin (MULTIVITAMIN WITH MINERALS) TABS tablet Take 1 tablet by mouth daily.     [provider]  omeprazole (PRILOSEC OTC) 20 MG tablet Take 20 mg by mouth every other day.    [provider]  ondansetron (ZOFRAN) 8 MG tablet Take by mouth every 8 (eight) hours as needed for nausea or vomiting.    [provider]  Polyethyl Glycol-Propyl Glycol (SYSTANE OP) Place 1 drop  into both eyes daily as needed (dry eyes).    [provider]  prochlorperazine (COMPAZINE) 10 MG tablet Take 10 mg by mouth every 6 (six) hours as needed for nausea or vomiting.    [provider]  rosuvastatin (CRESTOR) 20 MG tablet TAKE 1 TABLET(20 MG) BY MOUTH DAILY 02/07/23   Antoine Poche, MD  traMADol (ULTRAM) 50 MG tablet Take 2 tablets (100 mg total) by mouth every 12 (twelve) hours as needed. 03/31/23   Dettinger, Elige Radon, MD    Current Outpatient Medications  Medication Sig Dispense Refill   acetaminophen (TYLENOL) 500 MG tablet Take 500-1,000 mg by mouth every 6 (six) hours as needed for moderate pain.     acyclovir (ZOVIRAX) 400 MG tablet TAKE 1 TABLET(400 MG) BY MOUTH DAILY 90 tablet 1   apixaban (ELIQUIS) 5 MG TABS tablet TAKE 1 TABLET TWICE A DAY 180 tablet 2   Ascorbic Acid (VITAMIN C) 1000 MG tablet Take 1,000 mg by mouth daily.     brimonidine (ALPHAGAN) 0.2 % ophthalmic solution Place 1 drop into both eyes 2 (two) times daily.      Calcium Carbonate-Vit D-Min (CALCIUM 1200) 1200-1000 MG-UNIT CHEW Chew 1 tablet by mouth daily.     cyclobenzaprine (FLEXERIL) 5 MG tablet Take 5 mg by mouth 3 (three) times daily as needed for muscle spasms.     finasteride  (PROSCAR) 5 MG tablet Take 1 tablet (5 mg total) by mouth daily. 90 tablet 3   fluorouracil (EFUDEX) 5 % cream Apply topically.     lenalidomide (REVLIMID) 15 MG capsule TAKE 1 CAPSULE DAILY FOR 21 DAYS ON THEN 7 DAYS OFF, REPEAT EVERY 28 DAYS. 21 capsule 0   Lifitegrast (XIIDRA) 5 % SOLN Place 1 drop into both eyes 2 (two) times daily.     LUMIGAN 0.01 % SOLN Place 1 drop into both eyes at bedtime.     meclizine (ANTIVERT) 25 MG tablet Take 25 mg by mouth 3 (three) times daily as needed for dizziness.     metoprolol succinate (TOPROL-XL) 25 MG 24 hr tablet TAKE 1/2 TABLET(12.5 MG) BY MOUTH TWICE DAILY 90 tablet 1   Misc Natural Products (BEET ROOT PO) Take 1 Dose by mouth daily.     mometasone (ELOCON) 0.1 % cream Apply 1 Application topically daily.     Multiple Vitamin (MULTIVITAMIN WITH MINERALS) TABS tablet Take 1 tablet by mouth daily.      omeprazole (PRILOSEC OTC) 20 MG tablet Take 20 mg by mouth every other day.     ondansetron (ZOFRAN) 8 MG tablet Take by mouth every 8 (eight) hours as needed for nausea or vomiting.     Polyethyl Glycol-Propyl Glycol (SYSTANE OP) Place 1 drop into both eyes daily as needed (dry eyes).     prochlorperazine (COMPAZINE) 10 MG tablet Take 10 mg by mouth every 6 (six) hours as needed for nausea or vomiting.     rosuvastatin (CRESTOR) 20 MG tablet TAKE 1 TABLET(20 MG) BY MOUTH DAILY 90 tablet 1   traMADol (ULTRAM) 50 MG tablet Take 2 tablets (100 mg total) by mouth every 12 (twelve) hours as needed. 60 tablet 0   No current facility-administered medications for this visit.    Allergies as of 04/06/2023 - Review Complete 03/31/2023  Allergen Reaction Noted   Rituximab Other (See Comments) 04/29/2022    Family History  Problem Relation Age of Onset   Heart disease Father    Heart attack Father  x 3   Cancer Father    Cancer Sister        breast ca   Heart attack Sister    Cancer Sister        breast   Alzheimer's disease Sister     Diabetes Sister    Cancer Brother        prostate ca   Heart disease Brother    Heart disease Brother    Diabetes Brother    Heart disease Brother    Colon cancer Brother    Heart disease Brother    Heart disease Brother    Heart disease Brother    Heart disease Brother    Alzheimer's disease Brother    Cancer Brother    Diabetes Son    Anesthesia problems Neg Hx    Esophageal cancer Neg Hx    Rectal cancer Neg Hx    Stomach cancer Neg Hx     Social History   Socioeconomic History   Marital status: Married    Spouse name: Kendal Hymen   Number of children: 2   Years of education: GED   Highest education level: GED or equivalent  Occupational History   Occupation: Retired     Comment: Multimedia programmer   Occupation: Retired     Comment: [employer]  Tobacco Use   Smoking status: Former    Current packs/day: 0.00    Average packs/day: 1 pack/day for 21.7 years (21.7 ttl pk-yrs)    Types: Cigarettes    Start date: 12/12/1962    Quit date: 08/25/1984    Years since quitting: 38.6   Smokeless tobacco: Never  Vaping Use   Vaping status: Never Used  Substance and Sexual Activity   Alcohol use: Not Currently   Drug use: Never   Sexual activity: Not Currently  Other Topics Concern   Not on file  Social History Narrative   Right handed   Decaf coffee (2-3 per day)   Lives with wife   Social Drivers of Corporate investment banker Strain: Low Risk  (03/28/2023)   Overall Financial Resource Strain (CARDIA)    Difficulty of Paying Living Expenses: Not hard at all  Food Insecurity: No Food Insecurity (03/28/2023)   Hunger Vital Sign    Worried About Running Out of Food in the Last Year: Never true    Ran Out of Food in the Last Year: Never true  Transportation Needs: No Transportation Needs (03/28/2023)   PRAPARE - Administrator, Civil Service (Medical): No    Lack of Transportation (Non-Medical): No  Physical Activity: Insufficiently Active (03/28/2023)    Exercise Vital Sign    Days of Exercise per Week: 2 days    Minutes of Exercise per Session: 20 min  Stress: No Stress Concern Present (03/28/2023)   [employer] of Occupational Health - Occupational Stress Questionnaire    Feeling of Stress : Not at all  Social Connections: Moderately Integrated (03/28/2023)   Social Connection and Isolation Panel [NHANES]    Frequency of Communication with Friends and Family: Three times a week    Frequency of Social Gatherings with Friends and Family: Once a week    Attends Religious Services: More than 4 times per year    Active Member of [employer] or Organizations: No    Attends Banker Meetings: Not on file    Marital Status: Married  Intimate Partner Violence: Not At Risk (09/10/2022)   Humiliation, Afraid, Rape, and Kick questionnaire  Fear of Current or Ex-Partner: No    Emotionally Abused: No    Physically Abused: No    Sexually Abused: No    Review of Systems: Positive for *** All other review of systems negative except as mentioned in the HPI.  Physical Exam: Vital signs There were no vitals taken for this visit.  General:   Alert,  Well-developed, well-nourished, pleasant and cooperative in NAD Lungs:  Clear throughout to auscultation.   Heart:  Regular rate and rhythm; no murmurs, clicks, rubs,  or gallops. Abdomen:  Soft, nontender and nondistended. Normal bowel sounds.   Neuro/Psych:  Alert and cooperative. Normal mood and affect. A and O x 3   @Artemisa Sladek  Sena Slate, MD, [facility] [phone] (pager) 04/05/2023 5:06 PM@

## 2023-04-06 ENCOUNTER — Encounter: Payer: Self-pay | Admitting: Internal Medicine

## 2023-04-06 ENCOUNTER — Ambulatory Visit: Payer: Medicare Other | Admitting: Internal Medicine

## 2023-04-06 VITALS — BP 100/65 | HR 61 | Temp 97.9°F | Resp 21 | Ht 72.0 in | Wt 183.6 lb

## 2023-04-06 DIAGNOSIS — I4891 Unspecified atrial fibrillation: Secondary | ICD-10-CM | POA: Diagnosis not present

## 2023-04-06 DIAGNOSIS — J439 Emphysema, unspecified: Secondary | ICD-10-CM | POA: Diagnosis not present

## 2023-04-06 DIAGNOSIS — K529 Noninfective gastroenteritis and colitis, unspecified: Secondary | ICD-10-CM

## 2023-04-06 DIAGNOSIS — D123 Benign neoplasm of transverse colon: Secondary | ICD-10-CM | POA: Diagnosis not present

## 2023-04-06 DIAGNOSIS — K5730 Diverticulosis of large intestine without perforation or abscess without bleeding: Secondary | ICD-10-CM | POA: Diagnosis not present

## 2023-04-06 DIAGNOSIS — I2510 Atherosclerotic heart disease of native coronary artery without angina pectoris: Secondary | ICD-10-CM | POA: Diagnosis not present

## 2023-04-06 DIAGNOSIS — K648 Other hemorrhoids: Secondary | ICD-10-CM | POA: Diagnosis not present

## 2023-04-06 DIAGNOSIS — Z7901 Long term (current) use of anticoagulants: Secondary | ICD-10-CM | POA: Diagnosis not present

## 2023-04-06 MED ORDER — SODIUM CHLORIDE 0.9 % IV SOLN: 500.0000 mL | Freq: Once | INTRAVENOUS | Status: DC

## 2023-04-06 NOTE — Patient Instructions (Addendum)
 Colitis is gone. One tiny polyp seen and removed. Also have diverticulosis and hemorrhoids.  I will let you know polyp results.  Restart Eliquis tomorrow.  I appreciate the opportunity to care for you. Iva Boop, MD, [facility]      Continue previous diet & medications  Handouts on polyps,diverticulosis,& hemorrhoids given to you today.   Await pathology results on polyps removed    YOU HAD AN ENDOSCOPIC PROCEDURE TODAY AT THE [facility]:   Refer to the procedure report that was given to you for any specific questions about what was found during the examination.  If the procedure report does not answer your questions, please call your gastroenterologist to clarify.  If you requested that your care partner not be given the details of your procedure findings, then the procedure report has been included in a sealed envelope for you to review at your convenience later.  YOU SHOULD EXPECT: Some feelings of bloating in the abdomen. Passage of more gas than usual.  Walking can help get rid of the air that was put into your GI tract during the procedure and reduce the bloating. If you had a lower endoscopy (such as a colonoscopy or flexible sigmoidoscopy) you may notice spotting of blood in your stool or on the toilet paper. If you underwent a bowel prep for your procedure, you may not have a normal bowel movement for a few days.  Please Note:  You might notice some irritation and congestion in your nose or some drainage.  This is from the oxygen used during your procedure.  There is no need for concern and it should clear up in a day or so.  SYMPTOMS TO REPORT IMMEDIATELY:  Following lower endoscopy (colonoscopy or flexible sigmoidoscopy):  Excessive amounts of blood in the stool  Significant tenderness or worsening of abdominal pains  Swelling of the abdomen that is new, acute  Fever of 100F or higher    For urgent or emergent issues, a gastroenterologist can be  reached at any hour by calling [phone]. Do not use MyChart messaging for urgent concerns.    DIET:  We do recommend a small meal at first, but then you may proceed to your regular diet.  Drink plenty of fluids but you should avoid alcoholic beverages for 24 hours.  ACTIVITY:  You should plan to take it easy for the rest of today and you should NOT DRIVE or use heavy machinery until tomorrow (because of the sedation medicines used during the test).    FOLLOW UP: Our staff will call the number listed on your records the next business day following your procedure.  We will call around 7:15- 8:00 am to check on you and address any questions or concerns that you may have regarding the information given to you following your procedure. If we do not reach you, we will leave a message.     If any biopsies were taken you will be contacted by phone or by letter within the next 1-3 weeks.  Please call us at [phone] if you have not heard about the biopsies in 3 weeks.    SIGNATURES/CONFIDENTIALITY: You and/or your care partner have signed paperwork which will be entered into your electronic medical record.  These signatures attest to the fact that that the information above on your After Visit Summary has been reviewed and is understood.  Full responsibility of the confidentiality of this discharge information lies with you and/or your care-partner.

## 2023-04-06 NOTE — Op Note (Signed)
 [facility] Patient Name: Anthony Kelly Procedure Date: 04/06/2023 10:53 AM MRN: [id number] Endoscopist: Iva Boop , MD, [id number] Age: 78 Referring MD:  Date of Birth: 1945/12/10 Gender: Male Account #: [id number] Procedure:                Colonoscopy Indications:              Abnormal CT of the GI tract, Suspected colitis Medicines:                Monitored Anesthesia Care Procedure:                Pre-Anesthesia Assessment:                           - Prior to the procedure, a History and Physical                            was performed, and patient medications and                            allergies were reviewed. The patient's tolerance of                            previous anesthesia was also reviewed. The risks                            and benefits of the procedure and the sedation                            options and risks were discussed with the patient.                            All questions were answered, and informed consent                            was obtained. Prior Anticoagulants: The patient                            last took Eliquis (apixaban) 2 days prior to the                            procedure. ASA Grade Assessment: III - A patient                            with severe systemic disease. After reviewing the                            risks and benefits, the patient was deemed in                            satisfactory condition to undergo the procedure.                           After obtaining informed consent, the colonoscope  was passed under direct vision. Throughout the                            procedure, the patient's blood pressure, pulse, and                            oxygen saturations were monitored continuously. The                            CF [id number] #[id number] was introduced through the anus                            and advanced to the the cecum, identified by                             appendiceal orifice and ileocecal valve. The                            colonoscopy was performed without difficulty. The                            patient tolerated the procedure well. The quality                            of the bowel preparation was good. The ileocecal                            valve, appendiceal orifice, and rectum were                            photographed. The bowel preparation used was Plenvu                            via split dose instruction. Scope In: 11:14:28 AM Scope Out: 11:27:35 AM Scope Withdrawal Time: 0 hours 9 minutes 57 seconds  Total Procedure Duration: 0 hours 13 minutes 7 seconds  Findings:                 The perianal and digital rectal examinations were                            normal.                           A diminutive polyp was found in the transverse                            colon. The polyp was sessile. The polyp was removed                            with a cold snare. Resection and retrieval were                            complete. Verification of patient identification  for the specimen was done. Estimated blood loss was                            minimal.                           Multiple large-mouthed diverticula were found in                            the sigmoid colon, descending colon and transverse                            colon. There was narrowing of the colon in                            association with the diverticular opening.                           Internal hemorrhoids were found.                           The exam was otherwise without abnormality on                            direct and retroflexion views. Complications:            No immediate complications. Estimated Blood Loss:     Estimated blood loss was minimal. Impression:               - One diminutive polyp in the transverse colon,                            removed with a cold snare. Resected and retrieved.                            - Severe diverticulosis in the sigmoid colon and in                            the descending colon, also in transverse colon                            There was narrowing of the colon in association                            with the diverticular opening.                           - Internal hemorrhoids + mildly prominent                            hemorrhoidal veins                           - The examination was otherwise normal on direct  and retroflexion views. No colitis at this time -                            suspect it was infectious. Recommendation:           - Patient has a contact number available for                            emergencies. The signs and symptoms of potential                            delayed complications were discussed with the                            patient. Return to normal activities tomorrow.                            Written discharge instructions were provided to the                            patient.                           - Resume previous diet.                           - Continue present medications.                           - Resume Eliquis (apixaban) at prior dose tomorrow.                           - Await pathology results.                           - No repeat colonoscopy due to age. Iva Boop, MD 04/06/2023 11:37:15 AM This report has been signed electronically.

## 2023-04-06 NOTE — Progress Notes (Signed)
 Report to PACU, RN, vss, BBS= Clear.

## 2023-04-06 NOTE — Progress Notes (Signed)
 Called to room to assist during endoscopic procedure.  Patient ID and intended procedure confirmed with present staff. Received instructions for my participation in the procedure from the performing physician.

## 2023-04-07 ENCOUNTER — Telehealth: Payer: Self-pay

## 2023-04-07 ENCOUNTER — Encounter: Payer: Self-pay | Admitting: Family Medicine

## 2023-04-07 NOTE — Telephone Encounter (Signed)
 Left message on follow up call.

## 2023-04-08 LAB — SURGICAL PATHOLOGY

## 2023-04-09 ENCOUNTER — Encounter: Payer: Self-pay | Admitting: Internal Medicine

## 2023-04-11 ENCOUNTER — Other Ambulatory Visit: Payer: Self-pay | Admitting: Cardiology

## 2023-04-11 DIAGNOSIS — I480 Paroxysmal atrial fibrillation: Secondary | ICD-10-CM

## 2023-04-11 NOTE — Telephone Encounter (Signed)
 Prescription refill request for Eliquis received. Indication: PAF Last office visit: 01/24/23  Shawnie Dapper NP Scr: 1.03 on 03/31/23  Epic Age: 78 Weight: 84.4kg  Based on above findings Eliquis 5mg  twice daily is the appropriate dose.  Refill approved.

## 2023-04-19 ENCOUNTER — Ambulatory Visit (INDEPENDENT_AMBULATORY_CARE_PROVIDER_SITE_OTHER): Payer: Medicare Other

## 2023-04-19 VITALS — Ht 72.0 in | Wt 183.0 lb

## 2023-04-19 DIAGNOSIS — Z0000 Encounter for general adult medical examination without abnormal findings: Secondary | ICD-10-CM

## 2023-04-19 NOTE — Patient Instructions (Signed)
 Anthony Kelly , Thank you for taking time to come for your Medicare Wellness Visit. I appreciate your ongoing commitment to your health goals. Please review the following plan we discussed and let me know if I can assist you in the future.   Referrals/Orders/Follow-Ups/Clinician Recommendations: Aim for 30 minutes of exercise or brisk walking, 6-8 glasses of water, and 5 servings of fruits and vegetables each day.  This is a list of the screening recommended for you and due dates:  Health Maintenance  Topic Date Due   Eye exam for diabetics  09/01/2022   COVID-19 Vaccine (4 - 2024-25 season) 09/12/2022   Flu Shot  08/12/2023   Hemoglobin A1C  10/01/2023   Yearly kidney function blood test for diabetes  03/30/2024   Yearly kidney health urinalysis for diabetes  03/30/2024   DEXA scan (bone density measurement)  04/04/2024   Medicare Annual Wellness Visit  04/18/2024   DTaP/Tdap/Td vaccine (3 - Td or Tdap) 12/17/2031   Pneumonia Vaccine  Completed   Hepatitis C Screening  Completed   Zoster (Shingles) Vaccine  Completed   HPV Vaccine  Aged Out   Complete foot exam   Discontinued   Colon Cancer Screening  Discontinued    Advanced directives: (In Chart) A copy of your advanced directives are scanned into your chart should your provider ever need it.  Next Medicare Annual Wellness Visit scheduled for next year: Yes

## 2023-04-19 NOTE — Progress Notes (Signed)
 Subjective:   Anthony Kelly is a 78 y.o. who presents for a Medicare Wellness preventive visit.  Visit Complete: Virtual I connected with  Anthony Kelly on 04/19/23 by a video and audio enabled telemedicine application and verified that I am speaking with the correct person using two identifiers.  Patient Location: Home  Provider Location: Home Office  I discussed the limitations of evaluation and management by telemedicine. The patient expressed understanding and agreed to proceed.  Vital Signs: Because this visit was a virtual/telehealth visit, some criteria may be missing or patient reported. Any vitals not documented were not able to be obtained and vitals that have been documented are patient reported.  Persons Participating in Visit: Patient.  AWV Questionnaire: No: Patient Medicare AWV questionnaire was not completed prior to this visit.  Cardiac Risk Factors include: advanced age (>51men, >79 women);hypertension;dyslipidemia;male gender     Objective:    Today's Vitals   04/19/23 1417  Weight: 183 lb (83 kg)  Height: 6' (1.829 m)   Body mass index is 24.82 kg/m.     04/19/2023    4:07 PM 09/09/2022    4:00 PM 09/09/2022    7:43 AM 09/07/2022    3:37 PM 02/26/2022    9:32 AM 02/17/2022    6:12 AM 02/25/2021   11:52 AM  Advanced Directives  Does Patient Have a Medical Advance Directive? Yes Yes Yes No Yes Yes Yes  Type of Estate agent of [place];Living will Healthcare Power of [employer] [place];Living will Healthcare Power of [place];Living will Healthcare Power of [place];Living will  Does patient want to make changes to medical advance directive? No - Patient declined No - Patient declined   No - Patient declined No - Patient declined   Copy of Healthcare Power of Attorney in Chart? Yes - validated most recent copy scanned in chart (See row information)    Yes - validated most recent copy scanned in chart (See row  information) Yes - validated most recent copy scanned in chart (See row information) Yes - validated most recent copy scanned in chart (See row information)  Would patient like information on creating a medical advance directive?  No - Patient declined  No - Patient declined       Current Medications (verified) Outpatient Encounter Medications as of 04/19/2023  Medication Sig   acetaminophen (TYLENOL) 500 MG tablet Take 500-1,000 mg by mouth every 6 (six) hours as needed for moderate pain.   acyclovir (ZOVIRAX) 400 MG tablet TAKE 1 TABLET(400 MG) BY MOUTH DAILY   apixaban (ELIQUIS) 5 MG TABS tablet TAKE 1 TABLET TWICE A DAY   Ascorbic Acid (VITAMIN C) 1000 MG tablet Take 1,000 mg by mouth daily.   brimonidine (ALPHAGAN) 0.2 % ophthalmic solution Place 1 drop into both eyes 2 (two) times daily.    Calcium Carbonate-Vit D-Min (CALCIUM 1200) 1200-1000 MG-UNIT CHEW Chew 1 tablet by mouth daily.   cyclobenzaprine (FLEXERIL) 5 MG tablet Take 5 mg by mouth 3 (three) times daily as needed for muscle spasms.   finasteride (PROSCAR) 5 MG tablet Take 1 tablet (5 mg total) by mouth daily.   fluorouracil (EFUDEX) 5 % cream Apply topically.   lenalidomide (REVLIMID) 15 MG capsule TAKE 1 CAPSULE DAILY FOR 21 DAYS ON THEN 7 DAYS OFF, REPEAT EVERY 28 DAYS.   Lifitegrast (XIIDRA) 5 % SOLN Place 1 drop into both eyes 2 (two) times daily.   LUMIGAN 0.01 % SOLN Place 1 drop into  both eyes at bedtime.   meclizine (ANTIVERT) 25 MG tablet Take 25 mg by mouth 3 (three) times daily as needed for dizziness.   metoprolol succinate (TOPROL-XL) 25 MG 24 hr tablet TAKE 1/2 TABLET(12.5 MG) BY MOUTH TWICE DAILY   Misc Natural Products (BEET ROOT PO) Take 1 Dose by mouth daily.   mometasone (ELOCON) 0.1 % cream Apply 1 Application topically daily.   Multiple Vitamin (MULTIVITAMIN WITH MINERALS) TABS tablet Take 1 tablet by mouth daily.    omeprazole (PRILOSEC OTC) 20 MG tablet Take 20 mg by mouth every other day.   ondansetron  (ZOFRAN) 8 MG tablet Take by mouth every 8 (eight) hours as needed for nausea or vomiting.   Polyethyl Glycol-Propyl Glycol (SYSTANE OP) Place 1 drop into both eyes daily as needed (dry eyes).   prochlorperazine (COMPAZINE) 10 MG tablet Take 10 mg by mouth every 6 (six) hours as needed for nausea or vomiting.   rosuvastatin (CRESTOR) 20 MG tablet TAKE 1 TABLET(20 MG) BY MOUTH DAILY   traMADol (ULTRAM) 50 MG tablet Take 2 tablets (100 mg total) by mouth every 12 (twelve) hours as needed.   No facility-administered encounter medications on file as of 04/19/2023.    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 04/2013 -adm with CP, elevated CRP. H/o coronary artery calcification on prior CT but nuc was negative, EF 71%.   Arthritis    Atrial fibrillation (HCC)    on Metoprolol and Eliquis for this  a. Isolated episode in the setting of acute pericarditis 04/2013. Was not placed on anticoag.   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   Metastasis to lung (HCC) dx'd 01/2007   Metastasis to lymph nodes (HCC) dx'd 03/2011   lt submandibular ln   Pain in joint, pelvic region and thigh 08/01/2013   PSVT (paroxysmal supraventricular tachycardia) (HCC)    SCC (squamous cell carcinoma)    Left cheek   Past Surgical History:  Procedure Laterality Date   ATRIAL FIBRILLATION ABLATION N/A  02/17/2022   Procedure: ATRIAL FIBRILLATION ABLATION;  Surgeon: Maurice Small, MD;  Location: [facility] INVASIVE CV LAB;  Service: Cardiovascular;  Laterality: N/A;   CHOLECYSTECTOMY     CYSTOSCOPY N/A 09/09/2022   Procedure: CYSTOSCOPY;  Surgeon: Malen Gauze, MD;  Location: [facility] ORS;  Service: Urology;  Laterality: N/A;   EXPLORATORY LAPAROTOMY     EYE SURGERY Right 2016   cataract   LEFT HEART CATH AND CORONARY ANGIOGRAPHY N/A 06/05/2019   Procedure: LEFT HEART CATH AND CORONARY ANGIOGRAPHY;  Surgeon: Corky Crafts, MD;  Location: [facility] INVASIVE CV LAB;  Service: Cardiovascular;  Laterality: N/A;   LUNG LOBECTOMY     right side   LYMPH NODE BIOPSY     in groin with removal   MENISCUS REPAIR     right knee  MOHS SURGERY Left 09/2020   cheek   PROSTATE SURGERY     REFRACTIVE SURGERY Right    piece of metal removed   SUBMANDIBULAR GLAND EXCISION  04/2011   SUBMANDIBULAR GLAND EXCISION  04/30/2011   Procedure: EXCISION SUBMANDIBULAR GLAND;  Surgeon: Osborn Coho, MD;  Location: [facility] OR;  Service: ENT;  Laterality: Left;  WITH DIAGNOSTIC BIOPSY   TONSILLECTOMY     as a child   TRANSURETHRAL RESECTION OF PROSTATE N/A 09/09/2022   Procedure: TRANSURETHRAL RESECTION OF THE PROSTATE (TURP);  Surgeon: Malen Gauze, MD;  Location: [facility];  Service: Urology;  Laterality: N/A;   VEIN LIGATION AND STRIPPING     right leg   Family History  Problem Relation Age of Onset   Heart disease Father    Heart attack Father        x 3   Cancer Father    Cancer Sister        breast ca   Heart attack Sister    Cancer Sister        breast   Alzheimer's disease Sister    Diabetes Sister    Cancer Brother        prostate ca   Heart disease Brother    Heart disease Brother    Diabetes Brother    Heart disease Brother    Colon cancer Brother    Heart disease Brother    Heart disease Brother    Heart disease Brother    Heart disease Brother    Alzheimer's disease Brother     Cancer Brother    Diabetes Son    Anesthesia problems Neg Hx    Esophageal cancer Neg Hx    Rectal cancer Neg Hx    Stomach cancer Neg Hx    Social History   Socioeconomic History   Marital status: Married    Spouse name: Kendal Hymen   Number of children: 2   Years of education: GED   Highest education level: GED or equivalent  Occupational History   Occupation: Retired     Comment: Multimedia programmer   Occupation: Retired     Comment: [employer]  Tobacco Use   Smoking status: Former    Current packs/day: 0.00    Average packs/day: 1 pack/day for 21.7 years (21.7 ttl pk-yrs)    Types: Cigarettes    Start date: 12/12/1962    Quit date: 08/25/1984    Years since quitting: 38.6   Smokeless tobacco: Never  Vaping Use   Vaping status: Never Used  Substance and Sexual Activity   Alcohol use: Not Currently   Drug use: Never   Sexual activity: Not Currently  Other Topics Concern   Not on file  Social History Narrative   Right handed   Decaf coffee (2-3 per day)   Lives with wife   Social Drivers of Corporate investment banker Strain: Low Risk  (04/19/2023)   Overall Financial Resource Strain (CARDIA)    Difficulty of Paying Living Expenses: Not hard at all  Food Insecurity: No Food Insecurity (04/19/2023)   Hunger Vital Sign    Worried About Running Out of Food in the Last Year: Never true    Ran Out of Food in the Last Year: Never true  Transportation Needs: No Transportation Needs (04/19/2023)   PRAPARE - Administrator, Civil Service (Medical): No    Lack of Transportation (Non-Medical): No  Physical Activity: Insufficiently Active (04/19/2023)   Exercise Vital Sign  Days of Exercise per Week: 2 days    Minutes of Exercise per Session: 20 min  Stress: No Stress Concern Present (04/19/2023)   [employer] of Occupational Health - Occupational Stress Questionnaire    Feeling of Stress : Not at all  Social Connections: Moderately Integrated (04/19/2023)   Social  Connection and Isolation Panel [NHANES]    Frequency of Communication with Friends and Family: Three times a week    Frequency of Social Gatherings with Friends and Family: Once a week    Attends Religious Services: More than 4 times per year    Active Member of [employer] or Organizations: No    Attends Engineer, structural: Never    Marital Status: Married    Tobacco Counseling Counseling given: Not Answered    Clinical Intake:  Pre-visit preparation completed: Yes  Pain : No/denies pain     Diabetes: Yes CBG done?: No Did pt. bring in CBG monitor from home?: No  Lab Results  Component Value Date   HGBA1C 6.1 (H) 03/31/2023   HGBA1C 6.5 (H) 10/04/2022   HGBA1C 6.6 (H) 09/09/2022     How often do you need to have someone help you when you read instructions, pamphlets, or other written materials from your doctor or pharmacy?: 1 - Never  Interpreter Needed?: No  Information entered by :: Kandis Fantasia LPN   Activities of Daily Living     04/19/2023    2:17 PM 09/09/2022    4:00 PM  In your present state of health, do you have any difficulty performing the following activities:  Hearing? 0 0  Vision? 0 0  Difficulty concentrating or making decisions? 0 1  Comment  forgetful at times  Walking or climbing stairs? 0 0  Dressing or bathing? 0 0  Doing errands, shopping? 0 0  Preparing Food and eating ? N   Using the Toilet? N   In the past six months, have you accidently leaked urine? N   Do you have problems with loss of bowel control? N   Managing your Medications? N   Managing your Finances? N   Housekeeping or managing your Housekeeping? N     Patient Care Team: Dettinger, Elige Radon, MD as PCP - General (Family Medicine) Wyline Mood, Dorothe Pea, MD as PCP - Cardiology (Cardiology) Mealor, Roberts Gaudy, MD as PCP - Electrophysiology (Cardiology) Iva Boop, MD as Consulting Physician (Gastroenterology) Artis Delay, MD as Consulting Physician (Hematology  and Oncology) Cherlyn Roberts, MD as Consulting Physician (Dermatology) Tobias Alexander, OD as Referring Physician (Optometry)  Indicate any recent Medical Services you may have received from other than [facility] providers in the past year (date may be approximate).     Assessment:   This is a routine wellness examination for [facility].  Hearing/Vision screen Hearing Screening - Comments:: Denies hearing difficulties   Vision Screening - Comments:: Wears rx glasses - up to date with routine eye exams with Tobias Alexander    Goals Addressed   None    Depression Screen     04/19/2023    4:06 PM 03/31/2023    9:46 AM 12/29/2022    3:14 PM 10/04/2022    9:36 AM 04/05/2022   10:33 AM 02/26/2022    9:31 AM 10/05/2021   10:28 AM  PHQ 2/9 Scores  PHQ - 2 Score 0 0 0 0 0 0 0  PHQ- 9 Score    0 2  0    Fall Risk    04/19/2023  4:08 PM 03/31/2023    9:46 AM 12/29/2022    3:14 PM 10/04/2022    9:35 AM 04/05/2022   10:32 AM  Fall Risk   Falls in the past year? 0 0 0 0 0  Number falls in past yr: 0   0   Injury with Fall? 0   0   Risk for fall due to : No Fall Risks   No Fall Risks   Follow up Falls prevention discussed;Education provided;Falls evaluation completed   Education provided     MEDICARE RISK AT HOME:  Medicare Risk at Home Any stairs in or around the home?: No If so, are there any without handrails?: No Home free of loose throw rugs in walkways, pet beds, electrical cords, etc?: Yes Adequate lighting in your home to reduce risk of falls?: Yes Life alert?: No Use of a cane, walker or w/c?: No Grab bars in the bathroom?: Yes Shower chair or bench in shower?: No Elevated toilet seat or a handicapped toilet?: Yes  TIMED UP AND GO:  Was the test performed?  No  Cognitive Function: 6CIT completed    02/17/2018    1:07 PM 02/06/2015   11:55 AM  MMSE - Mini Mental State Exam  Orientation to time 5 5  Orientation to Place 5 5  Registration 3 3  Attention/ Calculation 5 5   Recall 2 3  Language- name 2 objects 2 2  Language- repeat 1 1  Language- follow 3 step command 3 3  Language- read & follow direction 1 1  Write a sentence 1 1  Copy design 1 1  Total score 29 30        04/19/2023    4:08 PM 02/26/2022    9:32 AM 02/25/2021   10:03 AM 02/25/2020   10:26 AM 02/21/2019    9:58 AM  6CIT Screen  What Year? 0 points 0 points 0 points 0 points 0 points  What month? 0 points 0 points 0 points  0 points  What time? 0 points 0 points 0 points 0 points 0 points  Count back from 20 0 points 0 points 0 points 0 points 0 points  Months in reverse 0 points 0 points 0 points 4 points 4 points  Repeat phrase 0 points 0 points 0 points 0 points 2 points  Total Score 0 points 0 points 0 points  6 points    Immunizations Immunization History  Administered Date(s) Administered   Fluad Quad(high Dose 65+) 09/12/2020, 09/23/2021   Influenza, High Dose Seasonal PF 10/12/2016, 10/01/2017   Influenza,inj,Quad PF,6+ Mos 09/27/2014, 10/16/2015   Influenza-Unspecified 09/11/2012, 11/11/2013, 10/12/2016, 09/12/2018   Moderna Sars-Covid-2 Vaccination 04/11/2019, 05/07/2019, 12/18/2019   Pneumococcal Conjugate-13 03/07/2013   Pneumococcal Polysaccharide-23 09/25/2012   Pneumococcal-Unspecified 04/03/2001, 08/09/2012   Tdap 08/11/2010, 12/16/2021   Zoster Recombinant(Shingrix) 02/17/2018, 08/05/2018    Screening Tests Health Maintenance  Topic Date Due   OPHTHALMOLOGY EXAM  09/01/2022   COVID-19 Vaccine (4 - 2024-25 season) 09/12/2022   INFLUENZA VACCINE  08/12/2023   HEMOGLOBIN A1C  10/01/2023   Diabetic kidney evaluation - eGFR measurement  03/30/2024   Diabetic kidney evaluation - Urine ACR  03/30/2024   DEXA SCAN  04/04/2024   Medicare Annual Wellness (AWV)  04/18/2024   DTaP/Tdap/Td (3 - Td or Tdap) 12/17/2031   Pneumonia Vaccine 68+ Years old  Completed   Hepatitis C Screening  Completed   Zoster Vaccines- Shingrix  Completed   HPV VACCINES  Aged Out  FOOT EXAM  Discontinued   Colonoscopy  Discontinued    Health Maintenance  Health Maintenance Due  Topic Date Due   OPHTHALMOLOGY EXAM  09/01/2022   COVID-19 Vaccine (4 - 2024-25 season) 09/12/2022   Health Maintenance Items Addressed: Diabetic eye exam notes requested   Additional Screening:  Vision Screening: Recommended annual ophthalmology exams for early detection of glaucoma and other disorders of the eye.  Dental Screening: Recommended annual dental exams for proper oral hygiene  Community Resource Referral / Chronic Care Management: CRR required this visit?  No   CCM required this visit?  No     Plan:     I have personally reviewed and noted the following in the patient's chart:   Medical and social history Use of alcohol, tobacco or illicit drugs  Current medications and supplements including opioid prescriptions. Patient is not currently taking opioid prescriptions. Functional ability and status Nutritional status Physical activity Advanced directives List of other physicians Hospitalizations, surgeries, and ER visits in previous 12 months Vitals Screenings to include cognitive, depression, and falls Referrals and appointments  In addition, I have reviewed and discussed with patient certain preventive protocols, quality metrics, and best practice recommendations. A written personalized care plan for preventive services as well as general preventive health recommendations were provided to patient.     Kandis Fantasia [place], [place]   08/11/1913   After Visit Summary: (MyChart) Due to this being a telephonic visit, the after visit summary with patients personalized plan was offered to patient via MyChart   Notes: Nothing significant to report at this time.

## 2023-04-28 ENCOUNTER — Other Ambulatory Visit: Payer: Self-pay | Admitting: Hematology and Oncology

## 2023-05-03 ENCOUNTER — Inpatient Hospital Stay: Payer: Medicare Other | Attending: Hematology and Oncology | Admitting: Hematology and Oncology

## 2023-05-03 ENCOUNTER — Other Ambulatory Visit: Payer: Self-pay

## 2023-05-03 ENCOUNTER — Inpatient Hospital Stay: Payer: Medicare Other

## 2023-05-03 ENCOUNTER — Encounter: Payer: Self-pay | Admitting: Hematology and Oncology

## 2023-05-03 VITALS — BP 103/61 | HR 86 | Temp 97.9°F | Resp 18 | Ht 72.0 in | Wt 184.2 lb

## 2023-05-03 DIAGNOSIS — C8288 Other types of follicular lymphoma, lymph nodes of multiple sites: Secondary | ICD-10-CM | POA: Diagnosis not present

## 2023-05-03 DIAGNOSIS — D6959 Other secondary thrombocytopenia: Secondary | ICD-10-CM | POA: Insufficient documentation

## 2023-05-03 DIAGNOSIS — C8280 Other types of follicular lymphoma, unspecified site: Secondary | ICD-10-CM

## 2023-05-03 DIAGNOSIS — Z9221 Personal history of antineoplastic chemotherapy: Secondary | ICD-10-CM | POA: Diagnosis not present

## 2023-05-03 DIAGNOSIS — D696 Thrombocytopenia, unspecified: Secondary | ICD-10-CM

## 2023-05-03 LAB — CBC WITH DIFFERENTIAL/PLATELET
Abs Immature Granulocytes: 0.02 10*3/uL (ref 0.00–0.07)
Basophils Absolute: 0 10*3/uL (ref 0.0–0.1)
Basophils Relative: 1 %
Eosinophils Absolute: 0.4 10*3/uL (ref 0.0–0.5)
Eosinophils Relative: 5 %
HCT: 40.7 % (ref 39.0–52.0)
Hemoglobin: 14 g/dL (ref 13.0–17.0)
Immature Granulocytes: 0 %
Lymphocytes Relative: 16 %
Lymphs Abs: 1.1 10*3/uL (ref 0.7–4.0)
MCH: 30.1 pg (ref 26.0–34.0)
MCHC: 34.4 g/dL (ref 30.0–36.0)
MCV: 87.5 fL (ref 80.0–100.0)
Monocytes Absolute: 0.9 10*3/uL (ref 0.1–1.0)
Monocytes Relative: 13 %
Neutro Abs: 4.6 10*3/uL (ref 1.7–7.7)
Neutrophils Relative %: 65 %
Platelets: 134 10*3/uL — ABNORMAL LOW (ref 150–400)
RBC: 4.65 MIL/uL (ref 4.22–5.81)
RDW: 14.4 % (ref 11.5–15.5)
WBC: 6.9 10*3/uL (ref 4.0–10.5)
nRBC: 0 % (ref 0.0–0.2)

## 2023-05-03 LAB — COMPREHENSIVE METABOLIC PANEL WITH GFR
ALT: 14 U/L (ref 0–44)
AST: 16 U/L (ref 15–41)
Albumin: 4.1 g/dL (ref 3.5–5.0)
Alkaline Phosphatase: 55 U/L (ref 38–126)
Anion gap: 6 (ref 5–15)
BUN: 13 mg/dL (ref 8–23)
CO2: 29 mmol/L (ref 22–32)
Calcium: 8.8 mg/dL — ABNORMAL LOW (ref 8.9–10.3)
Chloride: 105 mmol/L (ref 98–111)
Creatinine, Ser: 0.97 mg/dL (ref 0.61–1.24)
GFR, Estimated: >60 mL/min (ref >60)
Glucose, Bld: 149 mg/dL — ABNORMAL HIGH (ref 70–99)
Potassium: 4.3 mmol/L (ref 3.5–5.1)
Sodium: 140 mmol/L (ref 135–145)
Total Bilirubin: 0.9 mg/dL (ref 0.0–1.2)
Total Protein: 6.2 g/dL — ABNORMAL LOW (ref 6.5–8.1)

## 2023-05-03 NOTE — Assessment & Plan Note (Addendum)
This is likely due to recent treatment. The patient denies recent history of bleeding such as epistaxis, hematuria or hematochezia. He is asymptomatic from the low platelet count. I will observe for now.  he does not require transfusion now. I will continue the chemotherapy at current dose without dosage adjustment.    

## 2023-05-03 NOTE — Progress Notes (Signed)
 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  He had multiple relapses between 2005-20 25, most recently successfully treated with combination of lenalidomide  and rituximab   Rituximab  was discontinued in December due to poor tolerance with recurrent facial flushing He tolerated single agent maintenance lenalidomide  well without major side effects apart from mild thrombocytopenia today I will see him in 2 months for monitoring while on lenalidomide  I plan to order imaging study in June 2025 Thrombocytopenia [facility]) This is likely due to recent treatment. The patient denies recent history of bleeding such as epistaxis, hematuria or hematochezia. He is asymptomatic from the low platelet count. I will observe for now.  he does not require transfusion now. I will continue the chemotherapy at current dose without dosage adjustment.   Orders Placed This Encounter  Procedures   CT CHEST ABDOMEN PELVIS W CONTRAST    Standing Status:   Future    Expected Date:   06/13/2023    Expiration Date:   05/02/2024    If indicated for the ordered procedure, I authorize the administration of contrast media per Radiology protocol:   Yes    Does the patient have a contrast media/X-ray dye allergy?:   No    If indicated for the ordered procedure, I  authorize the administration of oral contrast media per Radiology protocol:   No    Reason for no oral contrast::   no need oral contrast    Preferred imaging location?:   [facility]     Almeda Jacobs, MD  INTERVAL HISTORY: he returns for treatment follow-up on lenalidomide  Complications related to previous cycle of chemotherapy included thrombocytopenia, The patient denies any recent signs or symptoms of bleeding such as spontaneous epistaxis, hematuria or hematochezia. Denies recent infection  PHYSICAL EXAMINATION: ECOG PERFORMANCE STATUS: 0 - Asymptomatic  Vitals:   05/03/23 1025  BP: 103/61  Pulse: 86  Resp: 18  Temp: 97.9 F (36.6 C)  SpO2: 96%   Filed Weights   05/03/23 1025  Weight: 184 lb 3.2 oz (83.6 kg)    Relevant data reviewed during this visit included CBC and CMP

## 2023-05-03 NOTE — Assessment & Plan Note (Addendum)
 The patient was originally diagnosed with follicular lymphoma in 2004, subsequently transformed into high-grade pathology and was treated successfully with chemotherapy.  He had multiple relapses between 2005-20 25, most recently successfully treated with combination of lenalidomide  and rituximab   Rituximab  was discontinued in December due to poor tolerance with recurrent facial flushing He tolerated single agent maintenance lenalidomide  well without major side effects apart from mild thrombocytopenia today I will see him in 2 months for monitoring while on lenalidomide  I plan to order imaging study in June 2025

## 2023-05-23 ENCOUNTER — Ambulatory Visit (HOSPITAL_COMMUNITY)
Admission: RE | Admit: 2023-05-23 | Discharge: 2023-05-23 | Disposition: A | Discharge status: Home / Self Care | Payer: Medicare Other | Source: Ambulatory Visit | Attending: Internal Medicine | Admitting: Internal Medicine

## 2023-05-23 VITALS — BP 140/80 | HR 64 | Ht 72.0 in | Wt 186.2 lb

## 2023-05-23 DIAGNOSIS — I480 Paroxysmal atrial fibrillation: Secondary | ICD-10-CM | POA: Diagnosis not present

## 2023-05-23 DIAGNOSIS — D6869 Other thrombophilia: Secondary | ICD-10-CM | POA: Insufficient documentation

## 2023-05-23 DIAGNOSIS — I4891 Unspecified atrial fibrillation: Secondary | ICD-10-CM | POA: Diagnosis not present

## 2023-05-23 MED ORDER — FLECAINIDE ACETATE 50 MG PO TABS: 50.0000 mg | ORAL_TABLET | Freq: Two times a day (BID) | ORAL | 3 refills | Status: DC

## 2023-05-23 NOTE — Patient Instructions (Signed)
 Start Flecainide 50mg  twice a day

## 2023-05-23 NOTE — Progress Notes (Signed)
 Primary Care Physician: Dettinger, Lucio Sabin, MD Primary Cardiologist: Dr Armida Lander Primary Electrophysiologist: Dr Arlester Ladd Referring Physician: Dr Astrid Blamer Nunn is a 78 y.o. male with a history of CAD, lymphoma, NSVT, atrial fibrillation who presents for follow up in the [facility].  The patient had been maintained on flecainide  but was discontinued due to his diagnosis of CAD. Patient is on Eliquis  for a CHADS2VASC score of 3. Patient underwent afib ablation with Dr Arlester Ladd on 02/17/22.  On follow up 05/23/23, he is currently in NSR. He has had several episodes of Afib since last office visit. He notes that episodes of Afib tend to occur with exertion. He wears an Apple watch which has confirmed Afib episodes. They are brief but affect his quality of life in a negative way. No bleeding issues on Eliquis .   Today, he denies symptoms of palpitations, chest pain, shortness of breath, orthopnea, PND, lower extremity edema, dizziness, presyncope, syncope, snoring, daytime somnolence, bleeding, or neurologic sequela. The patient is tolerating medications without difficulties and is otherwise without complaint today.    Atrial Fibrillation Risk Factors:  he does not have symptoms or diagnosis of sleep apnea. he does not have a history of rheumatic fever.   he has a BMI of Body mass index is 25.25 kg/m.Aaron Aas Filed Weights   05/23/23 0836  Weight: 84.5 kg    Family History  Problem Relation Age of Onset   Heart disease Father    Heart attack Father        x 3   Cancer Father    Cancer Sister        breast ca   Heart attack Sister    Cancer Sister        breast   Alzheimer's disease Sister    Diabetes Sister    Cancer Brother        prostate ca   Heart disease Brother    Heart disease Brother    Diabetes Brother    Heart disease Brother    Colon cancer Brother    Heart disease Brother    Heart disease Brother    Heart disease Brother     Heart disease Brother    Alzheimer's disease Brother    Cancer Brother    Diabetes Son    Anesthesia problems Neg Hx    Esophageal cancer Neg Hx    Rectal cancer Neg Hx    Stomach cancer Neg Hx      Atrial Fibrillation Management history:  Previous antiarrhythmic drugs: flecainide  Previous cardioversions: none Previous ablations: 02/17/22 CHADS2VASC score: 3 Anticoagulation history: Eliquis    Past Medical History:  Diagnosis Date   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 04/2013 -adm with CP, elevated CRP. H/o coronary artery calcification on prior CT but nuc was negative, EF 71%.   Arthritis    Atrial fibrillation (HCC)    on Metoprolol  and Eliquis  for this  a. Isolated episode in the setting of acute pericarditis 04/2013. Was not placed on anticoag.   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   Metastasis to lung (HCC) dx'd 01/2007   Metastasis to lymph nodes (HCC) dx'd 03/2011   lt submandibular ln   Pain in joint, pelvic region and thigh 08/01/2013   PSVT (paroxysmal supraventricular tachycardia) (HCC)    SCC (squamous cell carcinoma)    Left cheek   Past Surgical History:  Procedure Laterality Date   ATRIAL FIBRILLATION ABLATION N/A 02/17/2022   Procedure: ATRIAL FIBRILLATION ABLATION;  Surgeon: Efraim Grange, MD;  Location: [facility] INVASIVE CV LAB;  Service: Cardiovascular;  Laterality: N/A;   CHOLECYSTECTOMY     CYSTOSCOPY N/A  09/09/2022   Procedure: CYSTOSCOPY;  Surgeon: Marco Severs, MD;  Location: [facility] ORS;  Service: Urology;  Laterality: N/A;   EXPLORATORY LAPAROTOMY     EYE SURGERY Right 2016   cataract   LEFT HEART CATH AND CORONARY ANGIOGRAPHY N/A 06/05/2019   Procedure: LEFT HEART CATH AND CORONARY ANGIOGRAPHY;  Surgeon: Lucendia Rusk, MD;  Location: [facility] INVASIVE CV LAB;  Service: Cardiovascular;  Laterality: N/A;   LUNG LOBECTOMY     right side   LYMPH NODE BIOPSY     in groin with removal   MENISCUS REPAIR     right knee   MOHS SURGERY Left 09/2020   cheek   PROSTATE SURGERY     REFRACTIVE SURGERY Right    piece of metal removed   SUBMANDIBULAR GLAND EXCISION  04/2011   SUBMANDIBULAR GLAND EXCISION  04/30/2011   Procedure: EXCISION SUBMANDIBULAR GLAND;  Surgeon: Ammon Bales, MD;  Location: [facility] OR;  Service: ENT;  Laterality: Left;  WITH DIAGNOSTIC BIOPSY   TONSILLECTOMY     as a child   TRANSURETHRAL RESECTION OF PROSTATE N/A 09/09/2022   Procedure: TRANSURETHRAL RESECTION OF THE PROSTATE (TURP);  Surgeon: Marco Severs, MD;  Location: [facility];  Service: Urology;  Laterality: N/A;   VEIN LIGATION AND STRIPPING     right leg    Current Outpatient Medications  Medication Sig Dispense Refill   acetaminophen  (TYLENOL ) 500 MG tablet Take 500-1,000 mg by mouth as needed for moderate pain (pain score 4-6).     acyclovir  (ZOVIRAX ) 400 MG tablet TAKE 1 TABLET(400 MG) BY MOUTH DAILY 90 tablet 1   apixaban  (ELIQUIS ) 5 MG TABS tablet TAKE 1 TABLET TWICE A DAY 180 tablet 1   Ascorbic Acid (VITAMIN C) 1000 MG tablet Take 1,000 mg by mouth daily.     brimonidine  (ALPHAGAN ) 0.2 % ophthalmic solution Place 1 drop into both eyes 2 (two) times daily.      Calcium  Carbonate-Vit D-Min (CALCIUM  1200) 1200-1000 MG-UNIT CHEW Chew 1 tablet by mouth daily.     cyclobenzaprine  (FLEXERIL ) 5 MG tablet Take 5 mg by mouth 3 (three) times daily as needed for muscle spasms.     finasteride  (PROSCAR ) 5 MG  tablet Take 1 tablet (5 mg total) by mouth daily. 90 tablet 3   flecainide  (TAMBOCOR ) 50 MG tablet Take 1 tablet (50 mg total) by mouth 2 (two) times daily. 60 tablet 3   fluorouracil (EFUDEX) 5 % cream Apply topically as needed.     lenalidomide  (REVLIMID ) 15 MG capsule TAKE 1 CAPSULE DAILY FOR 21 DAYS ON THEN 7 DAYS OFF, REPEAT EVERY 28 DAYS. 21 capsule 0   Lifitegrast  (XIIDRA ) 5 % SOLN Place 1 drop into both eyes 2 (  two) times daily.     LUMIGAN  0.01 % SOLN Place 1 drop into both eyes at bedtime.     meclizine  (ANTIVERT ) 25 MG tablet Take 25 mg by mouth as needed for dizziness.     metoprolol  succinate (TOPROL -XL) 25 MG 24 hr tablet TAKE 1/2 TABLET(12.5 MG) BY MOUTH TWICE DAILY 90 tablet 1   Misc Natural Products (BEET ROOT PO) Take 1 Dose by mouth daily.     mometasone (ELOCON) 0.1 % cream Apply 1 Application topically as needed.     Multiple Vitamin (MULTIVITAMIN WITH MINERALS) TABS tablet Take 1 tablet by mouth daily.      omeprazole  (PRILOSEC  OTC) 20 MG tablet Take 20 mg by mouth every other day.     ondansetron  (ZOFRAN ) 8 MG tablet Take 8 mg by mouth as needed for nausea or vomiting.     Polyethyl Glycol-Propyl Glycol (SYSTANE OP) Place 1 drop into both eyes daily as needed (dry eyes).     prochlorperazine  (COMPAZINE ) 10 MG tablet Take 10 mg by mouth as needed for nausea or vomiting.     rosuvastatin  (CRESTOR ) 20 MG tablet TAKE 1 TABLET(20 MG) BY MOUTH DAILY 90 tablet 1   traMADol  (ULTRAM ) 50 MG tablet Take 2 tablets (100 mg total) by mouth every 12 (twelve) hours as needed. (Patient taking differently: Take 100 mg by mouth as needed.) 60 tablet 0   Triamcinolone  Acetonide (NASACORT  ALLERGY 24HR NA) Place 1 spray into the nose as needed.     No current facility-administered medications for this encounter.    Allergies  Allergen Reactions   Rituximab  Other (See Comments)    Throat tightness and facial flushing. Patient had hypersensitivty reaction to rapid rituximab . See progress note  dated 10/21/2023    ROS- All systems are reviewed and negative except as per the HPI above.  Physical Exam: Vitals:   05/23/23 0836  BP: (!) 140/80  Pulse: 64  Weight: 84.5 kg  Height: 6' (1.829 m)   GEN- The patient is well appearing, alert and oriented x 3 today.   Neck - no JVD or carotid bruit noted Lungs- Clear to ausculation bilaterally, normal work of breathing Heart- Regular rate and rhythm, no murmurs, rubs or gallops, PMI not laterally displaced Extremities- no clubbing, cyanosis, or edema Skin - no rash or ecchymosis noted   Wt Readings from Last 3 Encounters:  05/23/23 84.5 kg  05/03/23 83.6 kg  04/19/23 83 kg    EKG today demonstrates  Vent. rate 64 BPM PR interval 164 ms QRS duration 82 ms QT/QTcB 408/420 ms P-R-T axes 32 68 26 Sinus rhythm with Premature atrial complexes Otherwise normal ECG When compared with ECG of 23-Nov-2022 10:47, No significant change was found  Echo 09/02/20 demonstrated   1. Left ventricular ejection fraction, by estimation, is 60 to 65%. The  left ventricle has normal function. The left ventricle has no regional  wall motion abnormalities. There is mild left ventricular hypertrophy.  Left ventricular diastolic parameters were normal.   2. Right ventricular systolic function is normal. The right ventricular  size is normal. Tricuspid regurgitation signal is inadequate for assessing  PA pressure.   3. The mitral valve is normal in structure. No evidence of mitral valve  regurgitation. No evidence of mitral stenosis.   4. The aortic valve is tricuspid. Aortic valve regurgitation is not  visualized. No aortic stenosis is present.   5. The inferior vena cava is normal in size with greater than 50%  respiratory variability,  suggesting right atrial pressure of 3 mmHg.   Epic records are reviewed at length today  CHA2DS2-VASc Score = 5  The patient's score is based upon: CHF History: 0 HTN History: 1 Diabetes History:  1 Stroke History: 0 Vascular Disease History: 1 Age Score: 2 Gender Score: 0       ASSESSMENT AND PLAN: 1. Paroxysmal Atrial Fibrillation (ICD10:  I48.0) The patient's CHA2DS2-VASc score is 5, indicating a 7.2% annual risk of stroke.   S/p afib ablation 02/17/22.  He is currently in NSR. We had a lengthy discussion about medication treatments and ablation in detail. We discussed potential options such as Multaq, flecainide , Tikosyn, and amiodarone. We talked about the monitoring required for these medications, hospital admission for Tikosyn, and potential adverse effects. We also discussed pulse field ablation as an option in the form of intervention. After discussion, patient is interested in restarting flecainide . Will start flecainide  50 mg BID and recheck ECG in 7-10 days. Will need TST after ECG. He does not want to do a repeat ablation yet. There does not appear to be interaction between flecainide  and Revlimid .    2. Secondary Hypercoagulable State (ICD10:  D68.69) The patient is at significant risk for stroke/thromboembolism based upon his CHA2DS2-VASc Score of 5.  Continue Apixaban  (Eliquis ).  Continue Eliquis  5 mg BID.   3. CAD Mild non obstructive disease on LHC 2021 No chest pain.  4. Lymphoma Followed by Dr Rennie Casco [facility]    Follow up 7-10 days. Woody Heading, PA-C Afib Clinic [facility] [address] [place], [place] [address] [phone] 05/23/2023 9:17 AM

## 2023-05-30 ENCOUNTER — Ambulatory Visit (HOSPITAL_COMMUNITY)
Admission: RE | Admit: 2023-05-30 | Discharge: 2023-05-30 | Disposition: A | Discharge status: Home / Self Care | Source: Ambulatory Visit | Attending: Internal Medicine | Admitting: Internal Medicine

## 2023-05-30 VITALS — HR 61

## 2023-05-30 DIAGNOSIS — I480 Paroxysmal atrial fibrillation: Secondary | ICD-10-CM | POA: Insufficient documentation

## 2023-05-30 DIAGNOSIS — I4891 Unspecified atrial fibrillation: Secondary | ICD-10-CM | POA: Insufficient documentation

## 2023-05-30 NOTE — Progress Notes (Signed)
 Patient restarted flecainide  50 mg BID on 05/23/23 and is here for repeat ECG.  ECG today shows Vent. rate 61 BPM PR interval 176 ms QRS duration 82 ms QT/QTcB 420/422 ms P-R-T axes 13 58 6 Sinus rhythm with Premature supraventricular complexes Otherwise normal ECG When compared with ECG of 23-May-2023 08:46, No significant change was found   Will schedule TST. Patient will follow up in 3 months for flecainide  surveillance.

## 2023-05-30 NOTE — Addendum Note (Signed)
 Encounter addended by: Tess Fife, RN on: 05/30/2023 11:33 AM  Actions taken: Visit diagnoses modified, Order list changed, Diagnosis association updated

## 2023-05-30 NOTE — Patient Instructions (Addendum)
 Treadmill test -- scheduling will call once insurance authorization received.

## 2023-06-06 ENCOUNTER — Other Ambulatory Visit: Payer: Self-pay | Admitting: Hematology and Oncology

## 2023-06-12 ENCOUNTER — Encounter: Payer: Self-pay | Admitting: Family Medicine

## 2023-06-13 ENCOUNTER — Encounter (HOSPITAL_COMMUNITY): Payer: Self-pay

## 2023-06-13 ENCOUNTER — Ambulatory Visit (HOSPITAL_COMMUNITY)
Admission: RE | Admit: 2023-06-13 | Discharge: 2023-06-13 | Disposition: A | Discharge status: Home / Self Care | Source: Ambulatory Visit | Attending: Hematology and Oncology | Admitting: Hematology and Oncology

## 2023-06-13 ENCOUNTER — Inpatient Hospital Stay: Attending: Hematology and Oncology

## 2023-06-13 DIAGNOSIS — K449 Diaphragmatic hernia without obstruction or gangrene: Secondary | ICD-10-CM | POA: Diagnosis not present

## 2023-06-13 DIAGNOSIS — Z923 Personal history of irradiation: Secondary | ICD-10-CM | POA: Insufficient documentation

## 2023-06-13 DIAGNOSIS — Z9221 Personal history of antineoplastic chemotherapy: Secondary | ICD-10-CM | POA: Insufficient documentation

## 2023-06-13 DIAGNOSIS — K5730 Diverticulosis of large intestine without perforation or abscess without bleeding: Secondary | ICD-10-CM | POA: Diagnosis not present

## 2023-06-13 DIAGNOSIS — C8290 Follicular lymphoma, unspecified, unspecified site: Secondary | ICD-10-CM | POA: Diagnosis not present

## 2023-06-13 DIAGNOSIS — D696 Thrombocytopenia, unspecified: Secondary | ICD-10-CM | POA: Insufficient documentation

## 2023-06-13 DIAGNOSIS — I700 Atherosclerosis of aorta: Secondary | ICD-10-CM | POA: Diagnosis not present

## 2023-06-13 DIAGNOSIS — C8280 Other types of follicular lymphoma, unspecified site: Secondary | ICD-10-CM

## 2023-06-13 DIAGNOSIS — C828A Other types of follicular lymphoma, in remission: Secondary | ICD-10-CM | POA: Diagnosis not present

## 2023-06-13 LAB — COMPREHENSIVE METABOLIC PANEL WITH GFR
ALT: 14 U/L (ref 0–44)
AST: 13 U/L — ABNORMAL LOW (ref 15–41)
Albumin: 4.1 g/dL (ref 3.5–5.0)
Alkaline Phosphatase: 62 U/L (ref 38–126)
Anion gap: 6 (ref 5–15)
BUN: 19 mg/dL (ref 8–23)
CO2: 29 mmol/L (ref 22–32)
Calcium: 9.1 mg/dL (ref 8.9–10.3)
Chloride: 105 mmol/L (ref 98–111)
Creatinine, Ser: 1.12 mg/dL (ref 0.61–1.24)
GFR, Estimated: >60 mL/min (ref >60)
Glucose, Bld: 108 mg/dL — ABNORMAL HIGH (ref 70–99)
Potassium: 4.2 mmol/L (ref 3.5–5.1)
Sodium: 140 mmol/L (ref 135–145)
Total Bilirubin: 0.6 mg/dL (ref 0.0–1.2)
Total Protein: 6.4 g/dL — ABNORMAL LOW (ref 6.5–8.1)

## 2023-06-13 LAB — CBC WITH DIFFERENTIAL/PLATELET
Abs Immature Granulocytes: 0.02 10*3/uL (ref 0.00–0.07)
Basophils Absolute: 0.1 10*3/uL (ref 0.0–0.1)
Basophils Relative: 1 %
Eosinophils Absolute: 0.5 10*3/uL (ref 0.0–0.5)
Eosinophils Relative: 8 %
HCT: 44 % (ref 39.0–52.0)
Hemoglobin: 14.8 g/dL (ref 13.0–17.0)
Immature Granulocytes: 0 %
Lymphocytes Relative: 27 %
Lymphs Abs: 1.8 10*3/uL (ref 0.7–4.0)
MCH: 29.8 pg (ref 26.0–34.0)
MCHC: 33.6 g/dL (ref 30.0–36.0)
MCV: 88.7 fL (ref 80.0–100.0)
Monocytes Absolute: 1 10*3/uL (ref 0.1–1.0)
Monocytes Relative: 16 %
Neutro Abs: 3.2 10*3/uL (ref 1.7–7.7)
Neutrophils Relative %: 48 %
Platelets: 147 10*3/uL — ABNORMAL LOW (ref 150–400)
RBC: 4.96 MIL/uL (ref 4.22–5.81)
RDW: 14.2 % (ref 11.5–15.5)
WBC: 6.6 10*3/uL (ref 4.0–10.5)
nRBC: 0 % (ref 0.0–0.2)

## 2023-06-13 MED ORDER — IOHEXOL 300 MG/ML  SOLN
100.0000 mL | Freq: Once | INTRAMUSCULAR | Status: AC | PRN
  Administered 2023-06-13: 100 mL via INTRAVENOUS

## 2023-06-13 MED ORDER — SODIUM CHLORIDE (PF) 0.9 % IJ SOLN
INTRAMUSCULAR | Status: AC
  Filled 2023-06-13: qty 50

## 2023-06-13 MED ORDER — MECLIZINE HCL 25 MG PO TABS: 25.0000 mg | ORAL_TABLET | ORAL | 1 refills | Status: AC | PRN

## 2023-06-13 MED ORDER — IOHEXOL 9 MG/ML PO SOLN: 500.0000 mL | ORAL | Status: DC

## 2023-06-13 MED ORDER — IOHEXOL 9 MG/ML PO SOLN
1000.0000 mL | Freq: Once | ORAL | Status: AC
  Administered 2023-06-13: 1000 mL via ORAL

## 2023-06-13 MED ORDER — IOHEXOL 9 MG/ML PO SOLN
ORAL | Status: AC
  Filled 2023-06-13: qty 1000

## 2023-06-19 ENCOUNTER — Encounter: Payer: Self-pay | Admitting: Cardiovascular Disease

## 2023-06-20 ENCOUNTER — Other Ambulatory Visit (HOSPITAL_COMMUNITY): Payer: Self-pay | Admitting: *Deleted

## 2023-06-20 MED ORDER — FLECAINIDE ACETATE 50 MG PO TABS: 50.0000 mg | ORAL_TABLET | Freq: Two times a day (BID) | ORAL | 2 refills | Status: AC

## 2023-06-20 MED ORDER — FLECAINIDE ACETATE 50 MG PO TABS: 50.0000 mg | ORAL_TABLET | Freq: Two times a day (BID) | ORAL | 2 refills | Status: DC

## 2023-06-21 ENCOUNTER — Encounter: Payer: Self-pay | Admitting: Hematology and Oncology

## 2023-06-21 ENCOUNTER — Inpatient Hospital Stay (HOSPITAL_BASED_OUTPATIENT_CLINIC_OR_DEPARTMENT_OTHER): Admitting: Hematology and Oncology

## 2023-06-21 DIAGNOSIS — Z923 Personal history of irradiation: Secondary | ICD-10-CM | POA: Diagnosis not present

## 2023-06-21 DIAGNOSIS — D696 Thrombocytopenia, unspecified: Secondary | ICD-10-CM

## 2023-06-21 DIAGNOSIS — Z9221 Personal history of antineoplastic chemotherapy: Secondary | ICD-10-CM | POA: Diagnosis not present

## 2023-06-21 DIAGNOSIS — C8280 Other types of follicular lymphoma, unspecified site: Secondary | ICD-10-CM

## 2023-06-21 DIAGNOSIS — C828A Other types of follicular lymphoma, in remission: Secondary | ICD-10-CM | POA: Diagnosis not present

## 2023-06-21 NOTE — Assessment & Plan Note (Signed)
 The patient was originally diagnosed with follicular lymphoma in 2004, subsequently transformed into high-grade pathology and was treated successfully with chemotherapy.  He had multiple relapses between 2005-20 25, most recently successfully treated with combination of lenalidomide  and rituximab   Rituximab  was discontinued in December due to poor tolerance with recurrent facial flushing He tolerated single agent maintenance lenalidomide  well without major side effects  I have reviewed recent blood work and imaging study which showed excellent response to treatment with no signs of recurrence He started to have normal imaging studies since June 2024 I recommend minimum 2 years of treatment until June or 2026 before we decide whether the patient will stay on maintenance lenalidomide  indefinitely versus stopping

## 2023-06-21 NOTE — Progress Notes (Signed)
 HEMATOLOGY-ONCOLOGY ELECTRONIC VISIT PROGRESS NOTE  Patient Care Team: Dettinger, Lucio Sabin, MD as PCP - General (Family Medicine) Branch, Joyceann No, MD as PCP - Cardiology (Cardiology) Mealor, Donnamae Gaba, MD as PCP - Electrophysiology (Cardiology) Kenney Peacemaker, MD as Consulting Physician (Gastroenterology) Almeda Jacobs, MD as Consulting Physician (Hematology and Oncology) Eduardo Grade, MD as Consulting Physician (Dermatology) Sandor Crosser, OD as Referring Physician (Optometry)  I connected with the patient via telephone conference and verified that I am speaking with the correct person using two identifiers. The patient's location is at home and I am providing [facility] I discussed the limitations, risks, security and privacy concerns of performing an evaluation and management service by e-visits and the availability of in person appointments.  I also discussed with the patient that there may be a patient responsible charge related to this service. The patient expressed understanding and agreed to proceed.   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  He had multiple relapses between 2005-20 25, most recently successfully treated with combination of lenalidomide  and rituximab   Rituximab  was discontinued in December due to poor tolerance with recurrent facial flushing He tolerated single agent maintenance lenalidomide  well without major side effects  I have reviewed recent blood work and imaging study which showed excellent response to treatment with no signs of recurrence He started to have normal imaging studies since June 2024 I recommend minimum 2 years of treatment until June or 2026 before we decide whether the patient will stay on maintenance lenalidomide  indefinitely  versus stopping  Thrombocytopenia (HCC) This is likely due to recent treatment. The patient denies recent history of bleeding such as epistaxis, hematuria or hematochezia. He is asymptomatic from the low platelet count. I will observe for now.  he does not require transfusion now. I will continue the chemotherapy at current dose without dosage adjustment.   No orders of the defined types were placed in this encounter.   INTERVAL HISTORY: Please see below for problem oriented charting. The purpose of today's discussion is review test results He tolerated recent treatment well without major side effects  SUMMARY OF ONCOLOGIC HISTORY: Oncology History Overview Note  Low grade B-cell lymphoma   Primary site: Lymphoid Neoplasms   Staging method: AJCC 6th Edition   Clinical: Stage IV signed by Almeda Jacobs, MD on 09/26/2013  9:15 AM   Summary: Stage IV      Follicular low grade B-cell lymphoma (HCC)  12/10/2003 Surgery   Inguinal lymph node biopsy showed follicular lymphoma.   12/12/2003 - 06/01/2004 Chemotherapy   He was treated with R. CHOP chemotherapy which show complete remission. The number of cycles of R. CHOP chemotherapy was unknown.   12/19/2006 Surgery   Lung resection show follicular lymphoma.   01/02/2007 - 09/01/2008 Chemotherapy   The patient was treated with single agent rituximab  alone.   01/19/2007 Bone Marrow Biopsy   Bone marrow biopsy was negative.   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   05/08/2013 - 05/11/2013 Hospital Admission   The patient was admitted to the hospital for management of pericarditis. CT scan showed extensive lymphadenopathy.   06/07/2013 Imaging   PET/CT scan showed extensive lymphadenopathy   06/25/2013 Procedure   He has placement of Infuse-a-Port.   06/28/2013 Bone Marrow Biopsy   Bone marrow biopsy is positive  for lymphoma involvement.   07/04/2013 - 11/22/2013 Chemotherapy   He is treated with  6 cycles of bendamustine  with rituximab .   09/24/2013 Imaging   Repeat PET scan show complete remission.   12/26/2013 Imaging   PEt scan showed complete remission   09/26/2014 Imaging   CT scan of the chest abdomen and pelvis show no evidence of disease   03/26/2015 Imaging   CT scan showed no evidence of lymphoma   04/14/2016 Imaging   CT: Borderline prominent right hilar and subcarinal lymph nodes, but not appreciably changed. 2. Low-grade but increased central mesenteric stranding with some small mesenteric lymph nodes. This could certainly be inflammatory, 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, much of which is likely a physiologic response to cholecystectomy. 7. Sigmoid colon diverticulosis. 8.  Prominent stool throughout the colon favors constipation. 9. Enlarged prostate gland, volume estimated at 75 cubic cm.   02/15/2017 Imaging   No evidence of recurrent lymphoma or other acute findings.  Stable moderate hiatal hernia.  Colonic diverticulosis, without radiographic evidence of diverticulitis.  Stable mildly enlarged prostate.  Mild emphysema.  Aortic and coronary artery atherosclerosis.   01/31/2019 Imaging   1. Interval development of abdominal and pelvic adenopathy compatible with recurrent lymphoma. 2. Emphysema and aortic atherosclerosis. 3. Multi vessel coronary artery calcifications. 4. Moderate to large hiatal hernia   02/09/2019 Procedure   Successful CT-guided core biopsy of the left retroperitoneal periaortic adenopathy   02/09/2019 Pathology Results   SURGICAL PATHOLOGY  CASE: [id number]  PATIENT: Anthony Kelly  Surgical Pathology Report    Clinical History: Lymphoma; [name], LEFT PARA AORTIC, [name], cleaved nuclei and scant cytoplasm.  By immunohistochemistry, [name], [name], [name], [name] is low (less than 10%).  Flow cytometry was attempted; however, there was insufficient material for analysis (see [id number]).  Overall, the features are consistent with relapse of the patient's previously diagnosed follicular lymphoma.  Based on the biopsy, this is favored to be a low-grade follicular lymphoma   02/19/2019 -  Chemotherapy   The patient had idelisib for chemotherapy treatment.     05/17/2019 Imaging   1. Interval generalized decrease in abdominopelvic lymphadenopathy identified on the previous study. No new or progressive lymphadenopathy on today's study. 2. Moderate hiatal hernia. 3.  Emphysema (ICD10-J43.9) and Aortic Atherosclerosis (ICD10-170.0)   11/16/2019 Imaging   IMPRESSION: Chest Impression:   1. No mediastinal lymphadenopathy. 2. Post RIGHT lung wedge resection without evidence local recurrence.   Abdomen / Pelvis Impression:   1. Stable numerous small periaortic retroperitoneal nodes and common iliac nodes. No progression of adenopathy. 2. No splenomegaly.  No skeletal metastasis.     07/31/2020 Imaging   1. Stable examination. No significant interval change  in the prominent/mildly enlarged lymph nodes below the diaphragm. No thoracic adenopathy. No splenomegaly. 2.  Postsurgical change of right lung wedge resection without evidence of local recurrence. 3. Large hiatal hernia. 4. Colonic diverticulosis without findings of acute diverticulitis. 5. Aortic Atherosclerosis (ICD10-I70.0) and Emphysema (ICD10-J43.9).     01/29/2021 Imaging   1. Overall mild interval enlargement of lymph nodes in the abdomen and pelvis as described. 2. No lymphadenopathy identified in the chest. 3. Emphysematous changes of the lungs. 4. Large hiatal hernia.  Extensive colonic diverticulosis. 5. Other ancillary findings as described.     04/30/2021 Imaging   Chest Impression:   1. No mediastinal lymphadenopathy. 2. Large hiatal hernia. 3. Retrocrural node slightly decreased in size.   Abdomen / Pelvis Impression:   1. Cluster of numerous small retroperitoneal periaortic lymph nodes. Lymph nodes are decreased slightly in size compared to recent CT scan. 2. Likewise reduction in LEFT external iliac small lymph node. 3. No evidence of progression of lymphoma. 4. Normal spleen. 5. No bone involvement.   10/28/2021 Imaging   1. Overall trend towards progressive disease on today's exam. Lymph nodes in the hepatoduodenal ligament are upper normal for size but stable in the interval. Index retroperitoneal lymph nodes are progressive, notably in the right common iliac chain. Soft tissue nodule/lymph node in the retroperitoneal fat adjacent to the right iliacus muscle is progressive. 2. Large hiatal hernia. 3. Colonic diverticulosis without diverticulitis. 4. Prostatomegaly. 5. Aortic Atherosclerosis (ICD10-I70.0) and Emphysema (ICD10-J43.9).   03/10/2022 PET scan   Multiple intensely avid and enlarged retroperitoneal lymph nodes consistent with patient's known history of lymphoma. Of note, some of these lymph nodes demonstrate interval growth when compared to most recent abdominal CT, for reference to the node adjacent to the right psoas musculature. For purposes of assessing  transformation into a more aggressive form of lymphoma, SUV max calculated using total body weight is 10.5 for the largest right iliac node and 9.8 for the soft tissue implant posterior to the right psoas muscle.  - Similar focus of increased hypermetabolic to be within the right hilar region, which may represent mediastinal involvement versus reactive etiology. Attention on follow-up examinations.    03/24/2022 Procedure   PROCEDURE: The patient was placed in the prone position. Initial CT images of the pelvis were obtained, demonstrating enlarged pelvic lymph node.   An appropriate entry site was identified and marked on the skin. The right back was prepped and draped using all elements of maximal sterile barrier technique.  Local anesthesia was achieved with lidocaine .   Under intermittent CT guidance, a 18-G coaxial needle was inserted into right pelvic lymph node.  A total of 3 cores were obtained through the cannula using spring-loaded 18-G Corvocet biopsy device.    03/24/2022 Pathology Results   A: Lymph node, pelvis, right, biopsy -  Involved by follicular lymphoma (see Comment)   Special Stains: CD5, [name] and CD20 are performed by immunohistochemistry and/or in situ hybridization in addition to flow cytometry for histopathologic and immunophenotypic correlation.   Appropriate controls for each stain have been evaluated and stain as expected.   Sections of block A1 are stained.   CD3: CD3 stain is negative in neoplastic lymphocytes. CD20: CD20 stains neoplastic lymphocytes. CD21: CD21 stains follicular dendritic cells of enlarge neoplastic follicles. CD5: CD5 mirrors CD3 [name]: [name] stains weakly neoplastic lymphocytes. Bcl-6: Bcl-6 stains neoplastic lymphocytes. Bcl-2: Bcl-2 stains neoplastic lymphocytes.  Cyclin-D1: Cyclin-D1 stains is negative in neoplastic lymphocytes. Ki-67: Ki-67 shows low proliferation rate  in neoplastic lymphocytes (10%). GMS: GMS stain is negative for  microorganisms AFB: AFB stain is negative for microorganisms   04/08/2022 - 08/26/2022 Chemotherapy   Revlimid  was started on 4/4 Patient is on Treatment Plan : LYMPHOMA Lenalidomide  + Rituximab  q28d      07/01/2022 Imaging   CT CHEST ABDOMEN PELVIS W CONTRAST  Result Date: 07/01/2022 CLINICAL DATA:  Hematologic malignancy, assess treatment response follicular B-cell lymphoma, ongoing chemotherapy * Tracking Code: BO * EXAM: CT CHEST, ABDOMEN, AND PELVIS WITH CONTRAST TECHNIQUE: Multidetector CT imaging of the chest, abdomen and pelvis was performed following the standard protocol during bolus administration of intravenous contrast. RADIATION DOSE REDUCTION: This exam was performed according to the departmental dose-optimization program which includes automated exposure control, adjustment of the mA and/or kV according to patient size and/or use of iterative reconstruction technique. CONTRAST:  OMNIPAQUE  IOHEXOL  300 MG/ML  SOLN COMPARISON:  PET-CT, 03/10/2022 FINDINGS: CT CHEST FINDINGS Cardiovascular: Aortic atherosclerosis. Normal heart size. Three-vessel coronary artery calcifications. No pericardial effusion. Mediastinum/Nodes: No enlarged mediastinal, hilar, or axillary lymph nodes. Moderate hiatal hernia with intrathoracic position of the gastric fundus. Unchanged prominent lymph nodes at the right aspect of the hiatal hernia, not previously FDG avid. Thyroid  gland, trachea, and esophagus demonstrate no significant findings. Lungs/Pleura: Moderate centrilobular and paraseptal emphysema. Unchanged postoperative findings of right lower lobe wedge resection. No pleural effusion or pneumothorax. Musculoskeletal: No chest wall abnormality. No acute osseous findings. CT ABDOMEN PELVIS FINDINGS Hepatobiliary: No focal liver abnormality is seen. Status post cholecystectomy. Unchanged postoperative biliary ductal dilatation. Pancreas: Unremarkable. No pancreatic ductal dilatation or surrounding  inflammatory changes. Spleen: Normal in size without significant abnormality. Adrenals/Urinary Tract: Adrenal glands are unremarkable. Simple, benign left renal cortical cysts, for which no further follow-up or characterization is required. Kidneys are otherwise normal, without renal calculi, solid lesion, or hydronephrosis. Bladder is unremarkable. Stomach/Bowel: Stomach is within normal limits. Appendix not clearly visualized. No evidence of bowel wall thickening, distention, or inflammatory changes. Descending and sigmoid diverticulosis Vascular/Lymphatic: Aortic atherosclerosis. Interval decrease in size of previously FDG avid retroperitoneal lymph nodes, largest right common iliac node measuring 0.9 x 0.8 cm (series 2, image 73). Unchanged prominent retroperitoneal lymph nodes, not previously FDG avid (series 2, image 63). Reproductive: Prostatomegaly with median lobe hypertrophy. Other: Small fat containing right inguinal hernia. No ascites. Nearly complete resolution of a retroperitoneal soft tissue nodule overlying the right iliac crest measuring 0.7 x 0.5 cm (series 2, image 87). Musculoskeletal: No acute osseous findings. IMPRESSION: 1. Interval decrease in size of previously FDG avid retroperitoneal lymph nodes, as well as a soft tissue nodule overlying the right iliac crest. 2. Additional prominent lymph nodes adjacent to the hiatal hernia and in the retroperitoneum are unchanged, not previously FDG avid. 3. Findings are consistent with treatment response of lymphoma. 4. Emphysema. 5. Coronary artery disease. 6. Prostatomegaly. Aortic Atherosclerosis (ICD10-I70.0) and Emphysema (ICD10-J43.9). Electronically Signed   By: Fredricka Jenny M.D.   On: 07/01/2022 08:39      10/21/2022 - 12/31/2022 Chemotherapy   Patient is on Treatment Plan : NON-HODGKINS LYMPHOMA Rituximab  q60d Maintenance     12/14/2022 Imaging   CT CHEST ABDOMEN PELVIS W CONTRAST  Result Date: 12/14/2022 CLINICAL DATA:  History of  follicular B-cell lymphoma, assess treatment response. * Tracking Code: BO * EXAM: CT CHEST, ABDOMEN, AND PELVIS WITH CONTRAST TECHNIQUE: Multidetector CT imaging of the chest, abdomen and pelvis was performed following the standard protocol during bolus administration of intravenous contrast. RADIATION DOSE REDUCTION: This  exam was performed according to the departmental dose-optimization program which includes automated exposure control, adjustment of the mA and/or kV according to patient size and/or use of iterative reconstruction technique. CONTRAST:  OMNIPAQUE  IOHEXOL  300 MG/ML  SOLN COMPARISON:  Multiple priors including most recent CT June 29, 2022 FINDINGS: CT CHEST FINDINGS Cardiovascular: Scattered aortic atherosclerosis. No central pulmonary embolus on this nondedicated study. Normal size heart. Three-vessel coronary artery calcifications. Mediastinum/Nodes: No suspicious thyroid  nodule. Moderate-sized hiatal hernia No pathologically enlarged mediastinal, hilar or axillary lymph nodes. Unchanged prominent lymph nodes in the right side of the hiatal hernia for instance on image 48/2 not previously FDG avid. Lungs/Pleura: Moderate centrilobular and paraseptal emphysema. Similar postoperative findings from right lower lobe wedge resection. Scattered atelectasis/scarring. No suspicious pulmonary nodules or masses. Musculoskeletal: No aggressive lytic or blastic lesion of bone. 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 CT ABDOMEN PELVIS FINDINGS Hepatobiliary: Fluid signal hypodensity in the left lobe of the liver measuring 13 mm on image 53/2 is stable from prior and compatible with a benign cyst. No solid  enhancing hepatic lesion. Gallbladder is unremarkable. Prominence of the biliary tree is similar over multiple prior examinations and compatible with reservoir effect post cholecystectomy. Pancreas: No pancreatic ductal dilation or evidence of acute inflammation. Spleen: No splenomegaly. Adrenals/Urinary Tract: Bilateral adrenal glands appear normal. No hydronephrosis. Stable left renal cysts measuring up to 4.6 cm. Kidneys demonstrate symmetric enhancement. Urinary bladder is unremarkable for degree of distension. Stomach/Bowel: Radiopaque enteric contrast material traverses the descending colon. Moderate-sized hiatal hernia. No pathologic dilation of small or large bowel. Colonic diverticulosis without findings of acute diverticulitis. Vascular/Lymphatic: Aortic atherosclerosis. Smooth IVC contours. The portal, splenic and superior mesenteric veins are patent. Unchanged size of the prominent retroperitoneal lymph nodes. For reference: -left periaortic lymph node at the level of the renal vein measures 5 mm on image 64/2, unchanged. -right common iliac lymph node measures 4 mm in short axis on image 89/2, unchanged. No new pathologically enlarged or enlarging lymph nodes in the abdomen or pelvis identified. 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 Other: No significant abdominopelvic free fluid. Musculoskeletal: Stable small soft tissue nodule versus lymph node in the retroperitoneal soft tissues overlying the right iliac crest on image 89/2. No aggressive lytic or blastic lesion of bone. Multilevel degenerative change of the spine. IMPRESSION: 1. Stable examination without evidence of new or progressive disease in the chest, abdomen or pelvis. 2. Aortic Atherosclerosis (ICD10-I70.0) and Emphysema (ICD10-J43.9). Electronically Signed   By: Tama Fails M.D.   On: 12/14/2022 13:43      06/13/2023 Imaging   CT CHEST ABDOMEN PELVIS W CONTRAST Result Date: 06/13/2023 CLINICAL DATA:  Follicular lymphoma,  rule out recurrence * Tracking Code: BO * EXAM: CT CHEST, ABDOMEN, AND PELVIS WITH CONTRAST TECHNIQUE: Multidetector CT imaging of the chest, abdomen and pelvis was performed following the standard protocol during bolus administration of intravenous contrast. RADIATION DOSE REDUCTION: This exam was performed according to the departmental dose-optimization program which includes automated exposure control, adjustment of the mA and/or kV according to patient size and/or use of iterative reconstruction technique. CONTRAST:  OMNIPAQUE  IOHEXOL  300 MG/ML SOLN additional oral enteric contrast COMPARISON:  12/14/2022 FINDINGS: CT CHEST FINDINGS Cardiovascular: No significant vascular findings. Normal heart size.  No pericardial effusion. Mediastinum/Nodes: No enlarged mediastinal, hilar, or axillary lymph nodes. Moderate hiatal hernia with intrathoracic position of the gastric fundus. Thyroid  gland, trachea, and esophagus demonstrate no significant findings. Lungs/Pleura: Mild centrilobular and paraseptal emphysema. Unchanged postoperative findings of wedge resection involving the medial right lower lobe. No pleural effusion or pneumothorax. Musculoskeletal: No chest wall abnormality. No acute osseous findings. CT ABDOMEN PELVIS FINDINGS Hepatobiliary: No focal liver abnormality is seen. Status post cholecystectomy. Unchanged postoperative biliary ductal dilatation. Pancreas: Unremarkable. No pancreatic ductal dilatation or surrounding inflammatory changes. Spleen: Normal in size without significant abnormality. Adrenals/Urinary Tract: Adrenal glands are unremarkable. Benign left renal cortical cysts, for which no further follow-up or characterization is required. Kidneys are otherwise normal, without renal calculi, solid lesion, or hydronephrosis. Bladder is unremarkable. Stomach/Bowel: Stomach is within normal limits. Appendix appears normal. No evidence of bowel wall thickening, distention, or inflammatory changes.  Descending and sigmoid diverticulosis. Vascular/Lymphatic: Aortic atherosclerosis. No persistently enlarged lymph nodes. Unchanged, treated subcentimeter retroperitoneal lymph nodes (series 2, image 67). Reproductive: Probable TURP defect of the prostate. Other: No abdominal wall hernia or abnormality. No ascites. Musculoskeletal: No acute osseous findings. IMPRESSION: 1. No persistently enlarged lymph nodes of the chest, abdomen, or pelvis. Unchanged, treated subcentimeter retroperitoneal lymph nodes. 2. Unchanged postoperative findings of wedge resection involving the medial right lower lobe. 3. Emphysema. 4. Descending and sigmoid diverticulosis without evidence of acute diverticulitis. Aortic Atherosclerosis (ICD10-I70.0) and Emphysema (ICD10-J43.9). Electronically Signed   By: Fredricka Jenny M.D.   On: 06/13/2023 13:04       I discussed the assessment and treatment plan with the patient. The patient was provided an opportunity to ask questions and all were answered. The patient agreed with the plan and demonstrated an understanding of the instructions. The patient was advised to call back or seek an in-person evaluation if the symptoms worsen or if the condition fails to improve as anticipated.    I spent 20 minutes for the appointment reviewing test results, discuss management and coordination of care.  Almeda Jacobs, MD 06/21/2023 1:59 PM

## 2023-06-21 NOTE — Assessment & Plan Note (Signed)
This is likely due to recent treatment. The patient denies recent history of bleeding such as epistaxis, hematuria or hematochezia. He is asymptomatic from the low platelet count. I will observe for now.  he does not require transfusion now. I will continue the chemotherapy at current dose without dosage adjustment.    

## 2023-06-23 ENCOUNTER — Encounter (HOSPITAL_COMMUNITY)

## 2023-06-23 ENCOUNTER — Telehealth (HOSPITAL_COMMUNITY): Payer: Self-pay | Admitting: *Deleted

## 2023-06-23 NOTE — Telephone Encounter (Signed)
 Pt given instructions for upcoming stress test.  Anthony Kelly

## 2023-06-29 ENCOUNTER — Ambulatory Visit (HOSPITAL_COMMUNITY)
Admission: RE | Admit: 2023-06-29 | Discharge: 2023-06-29 | Disposition: A | Discharge status: Home / Self Care | Source: Ambulatory Visit | Attending: Cardiology | Admitting: Cardiology

## 2023-06-29 DIAGNOSIS — I480 Paroxysmal atrial fibrillation: Secondary | ICD-10-CM | POA: Diagnosis not present

## 2023-07-01 LAB — EXERCISE TOLERANCE TEST
Angina Index: 0
Duke Treadmill Score: 7
Estimated workload: 9.2
Exercise duration (min): 7 min
Exercise duration (sec): 26 s
MPHR: 142 {beats}/min
Peak HR: 129 {beats}/min
Percent HR: 90 %
RPE: 18
Rest HR: 62 {beats}/min
ST Depression (mm): 0 mm

## 2023-07-05 ENCOUNTER — Other Ambulatory Visit: Payer: Self-pay

## 2023-07-05 ENCOUNTER — Ambulatory Visit (HOSPITAL_COMMUNITY): Payer: Self-pay | Admitting: Internal Medicine

## 2023-07-05 MED ORDER — LENALIDOMIDE 15 MG PO CAPS: ORAL_CAPSULE | ORAL | 0 refills | Status: DC

## 2023-07-19 DIAGNOSIS — Z129 Encounter for screening for malignant neoplasm, site unspecified: Secondary | ICD-10-CM | POA: Diagnosis not present

## 2023-07-19 DIAGNOSIS — L821 Other seborrheic keratosis: Secondary | ICD-10-CM | POA: Diagnosis not present

## 2023-07-19 DIAGNOSIS — L820 Inflamed seborrheic keratosis: Secondary | ICD-10-CM | POA: Diagnosis not present

## 2023-07-19 DIAGNOSIS — D485 Neoplasm of uncertain behavior of skin: Secondary | ICD-10-CM | POA: Diagnosis not present

## 2023-07-19 DIAGNOSIS — D692 Other nonthrombocytopenic purpura: Secondary | ICD-10-CM | POA: Diagnosis not present

## 2023-07-19 DIAGNOSIS — Z08 Encounter for follow-up examination after completed treatment for malignant neoplasm: Secondary | ICD-10-CM | POA: Diagnosis not present

## 2023-07-19 DIAGNOSIS — Z86008 Personal history of in-situ neoplasm of other site: Secondary | ICD-10-CM | POA: Diagnosis not present

## 2023-07-19 DIAGNOSIS — D225 Melanocytic nevi of trunk: Secondary | ICD-10-CM | POA: Diagnosis not present

## 2023-07-19 DIAGNOSIS — L570 Actinic keratosis: Secondary | ICD-10-CM | POA: Diagnosis not present

## 2023-07-25 ENCOUNTER — Ambulatory Visit: Payer: Medicare Other | Admitting: Nurse Practitioner

## 2023-07-27 ENCOUNTER — Other Ambulatory Visit: Payer: Self-pay | Admitting: Hematology and Oncology

## 2023-07-27 ENCOUNTER — Encounter: Payer: Self-pay | Admitting: Hematology and Oncology

## 2023-07-27 MED ORDER — CYCLOBENZAPRINE HCL 5 MG PO TABS: 5.0000 mg | ORAL_TABLET | Freq: Three times a day (TID) | ORAL | 0 refills | Status: AC | PRN

## 2023-07-28 ENCOUNTER — Ambulatory Visit: Payer: Self-pay

## 2023-07-28 ENCOUNTER — Ambulatory Visit: Admitting: Family Medicine

## 2023-07-28 ENCOUNTER — Encounter: Payer: Self-pay | Admitting: Family Medicine

## 2023-07-28 VITALS — BP 119/76 | HR 74 | Ht 72.0 in | Wt 183.0 lb

## 2023-07-28 DIAGNOSIS — M533 Sacrococcygeal disorders, not elsewhere classified: Secondary | ICD-10-CM

## 2023-07-28 MED ORDER — METHYLPREDNISOLONE ACETATE 80 MG/ML IJ SUSP
80.0000 mg | Freq: Once | INTRAMUSCULAR | Status: AC
  Administered 2023-07-28: 80 mg via INTRAMUSCULAR

## 2023-07-28 NOTE — Progress Notes (Signed)
 BP 119/76   Pulse 74   Ht 6' (1.829 m)   Wt 183 lb (83 kg)   SpO2 95%   BMI 24.82 kg/m    Subjective:   Patient ID: Anthony Kelly, male    DOB: 1945-08-07, 78 y.o.   MRN: [id number]  HPI: Anthony Kelly is a 78 y.o. male presenting on 07/28/2023 for Back Pain (Left sided)   HPI Left-sided back pain Patient comes in today with a 5-day history of left-sided lower back pain.  He says he was working with some tools and twisting side-to-side and grabbing the and then working in felt something sharp in his lower back.  He did not have any fall or trauma.  He says has been having the pain since then and this was 5 days ago.  He has been taking some Tylenol  and some muscle relaxer and that helps some but overall it is not improving.  He still has just a significant pain that he was having.  Hurts him to get up or down or move or twist.  Relevant past medical, surgical, family and social history reviewed and updated as indicated. Interim medical history since our last visit reviewed. Allergies and medications reviewed and updated.  Review of Systems  Constitutional:  Negative for chills and fever.  Eyes:  Negative for visual disturbance.  Respiratory:  Negative for shortness of breath and wheezing.   Cardiovascular:  Negative for chest pain and leg swelling.  Musculoskeletal:  Positive for arthralgias, back pain and myalgias. Negative for gait problem.  Skin:  Negative for rash.  All other systems reviewed and are negative.   Per HPI unless specifically indicated above   Allergies as of 07/28/2023       Reactions   Rituximab  Other (See Comments)   Throat tightness and facial flushing. Patient had hypersensitivty reaction to rapid rituximab . See progress note dated 10/21/2023        Medication List        Accurate as of July 28, 2023  4:37 PM. If you have any questions, ask your nurse or doctor.          acetaminophen  500 MG tablet Commonly known as: TYLENOL  Take  500-1,000 mg by mouth as needed for moderate pain (pain score 4-6).   acyclovir  400 MG tablet Commonly known as: ZOVIRAX  TAKE 1 TABLET(400 MG) BY MOUTH DAILY   BEET ROOT PO Take 1 Dose by mouth daily.   brimonidine  0.2 % ophthalmic solution Commonly known as: ALPHAGAN  Place 1 drop into both eyes 2 (two) times daily.   Calcium  1200 1200-1000 MG-UNIT Chew Chew 1 tablet by mouth daily.   cyclobenzaprine  5 MG tablet Commonly known as: FLEXERIL  Take 1 tablet (5 mg total) by mouth 3 (three) times daily as needed for muscle spasms.   Eliquis  5 MG Tabs tablet Generic drug: apixaban  TAKE 1 TABLET TWICE A DAY   finasteride  5 MG tablet Commonly known as: PROSCAR  Take 1 tablet (5 mg total) by mouth daily.   flecainide  50 MG tablet Commonly known as: TAMBOCOR  Take 1 tablet (50 mg total) by mouth 2 (two) times daily.   fluorouracil 5 % cream Commonly known as: EFUDEX Apply topically as needed.   lenalidomide  15 MG capsule Commonly known as: Revlimid  TAKE 1 CAPSULE DAILY FOR 21 DAYS ON THEN 7 DAYS OFF, REPEAT EVERY 28 DAYS   Lumigan  0.01 % Soln Generic drug: bimatoprost  Place 1 drop into both eyes at bedtime.   meclizine  25 MG tablet  Commonly known as: ANTIVERT  Take 1 tablet (25 mg total) by mouth as needed for dizziness.   metoprolol  succinate 25 MG 24 hr tablet Commonly known as: TOPROL -XL TAKE 1/2 TABLET(12.5 MG) BY MOUTH TWICE DAILY   mometasone 0.1 % cream Commonly known as: ELOCON Apply 1 Application topically as needed.   multivitamin with minerals Tabs tablet Take 1 tablet by mouth daily.   NASACORT  ALLERGY 24HR NA Place 1 spray into the nose as needed.   omeprazole  20 MG tablet Commonly known as: PRILOSEC  OTC Take 20 mg by mouth every other day.   ondansetron  8 MG tablet Commonly known as: ZOFRAN  Take 8 mg by mouth as needed for nausea or vomiting.   prochlorperazine  10 MG tablet Commonly known as: COMPAZINE  Take 10 mg by mouth as needed for nausea or  vomiting.   rosuvastatin  20 MG tablet Commonly known as: CRESTOR  TAKE 1 TABLET(20 MG) BY MOUTH DAILY   SYSTANE OP Place 1 drop into both eyes daily as needed (dry eyes).   traMADol  50 MG tablet Commonly known as: Ultram  Take 2 tablets (100 mg total) by mouth every 12 (twelve) hours as needed. What changed: when to take this   vitamin C 1000 MG tablet Take 1,000 mg by mouth daily.         Objective:   BP 119/76   Pulse 74   Ht 6' (1.829 m)   Wt 183 lb (83 kg)   SpO2 95%   BMI 24.82 kg/m   Wt Readings from Last 3 Encounters:  07/28/23 183 lb (83 kg)  05/23/23 186 lb 3.2 oz (84.5 kg)  05/03/23 184 lb 3.2 oz (83.6 kg)    Physical Exam Vitals and nursing note reviewed.  Constitutional:      General: He is not in acute distress.    Appearance: He is well-developed. He is not diaphoretic.  Eyes:     General: No scleral icterus.    Conjunctiva/sclera: Conjunctivae normal.  Neck:     Thyroid : No thyromegaly.  Musculoskeletal:        General: Normal range of motion.     Cervical back: Neck supple.       Back:  Lymphadenopathy:     Cervical: No cervical adenopathy.  Skin:    General: Skin is warm and dry.     Findings: No rash.  Neurological:     Mental Status: He is alert and oriented to person, place, and time.     Coordination: Coordination normal.  Psychiatric:        Behavior: Behavior normal.       Assessment & Plan:   Problem List Items Addressed This Visit   None Visit Diagnoses       SI (sacroiliac) joint dysfunction    -  Primary   Relevant Medications   methylPREDNISolone  acetate (DEPO-MEDROL ) injection 80 mg (Start on 07/28/2023  4:45 PM)       Continue Tylenol  and muscle relaxer, will give him a Depo-Medrol  80 mg intramuscular injection today. Follow up plan: Return if symptoms worsen or fail to improve.  Counseling provided for all of the vaccine components No orders of the defined types were placed in this encounter.   Fonda Levins, MD [facility] 07/28/2023, 4:37 PM

## 2023-07-28 NOTE — Telephone Encounter (Signed)
 FYI Only or Action Required?: FYI only for provider.  Patient was last seen in primary care on 03/31/2023 by Dettinger, Fonda LABOR, MD.  Called Nurse Triage reporting Back Pain.  Symptoms began several days ago.  Interventions attempted: OTC medications: Tylenol , Rest, hydration, or home remedies, and Ice/heat application.  Symptoms are: unchanged.  Triage Disposition: See HCP Within 4 Hours (Or PCP Triage)  Patient/caregiver understands and will follow disposition?: Yes  Copied from CRM [id number]. Topic: Clinical - Red Word Triage >> Jul 28, 2023 12:07 PM Carlatta H wrote: [facility] that prompted transfer to Nurse Triage: Severe back pain since  Sunday//Lower back left side Reason for Disposition  [1] SEVERE back pain (e.g., excruciating, unable to do any normal activities) AND [2] not improved 2 hours after pain medicine  Answer Assessment - Initial Assessment Questions 1. ONSET: When did the pain begin? (e.g., minutes, hours, days)     Started on Sunday 2. LOCATION: Where does it hurt? (upper, mid or lower back)     Left Lower back 3. SEVERITY: How bad is the pain?  (e.g., Scale 1-10; mild, moderate, or severe)     Mild pain sitting in chair. Severe pain with walking 4. PATTERN: Is the pain constant? (e.g., yes, no; constant, intermittent)      constant 5. RADIATION: Does the pain shoot into your legs or somewhere else?     no 6. CAUSE:  What do you think is causing the back pain?      Working on [employer] on Sunday 7. BACK OVERUSE:  Any recent lifting of heavy objects, strenuous work or exercise?     Working on [employer], mining 8. MEDICINES: What have you taken so far for the pain? (e.g., nothing, acetaminophen , NSAIDS)     Tylenol  and muscle relaxers 9. NEUROLOGIC SYMPTOMS: Do you have any weakness, numbness, or problems with bowel/bladder control?     no 10. OTHER SYMPTOMS: Do you have any other symptoms? (e.g., fever, abdomen pain, burning with urination,  blood in urine)       no  Protocols used: Back Pain-A-AH

## 2023-07-28 NOTE — Telephone Encounter (Signed)
 Appt made

## 2023-08-01 MED ORDER — PREDNISONE 20 MG PO TABS: ORAL_TABLET | ORAL | 0 refills | Status: DC

## 2023-08-04 ENCOUNTER — Other Ambulatory Visit: Payer: Self-pay | Admitting: Cardiology

## 2023-08-04 ENCOUNTER — Telehealth: Payer: Self-pay | Admitting: Hematology and Oncology

## 2023-08-04 ENCOUNTER — Other Ambulatory Visit: Payer: Self-pay | Admitting: *Deleted

## 2023-08-04 MED ORDER — LENALIDOMIDE 15 MG PO CAPS: ORAL_CAPSULE | ORAL | 0 refills | Status: DC

## 2023-08-04 NOTE — Telephone Encounter (Signed)
 Spoke with patient confirming upcoming appointment

## 2023-08-05 ENCOUNTER — Other Ambulatory Visit: Payer: Self-pay | Admitting: Hematology and Oncology

## 2023-08-07 ENCOUNTER — Other Ambulatory Visit: Payer: Self-pay | Admitting: Cardiology

## 2023-08-09 ENCOUNTER — Encounter: Payer: Self-pay | Admitting: Hematology and Oncology

## 2023-08-11 ENCOUNTER — Encounter: Payer: Self-pay | Admitting: Family Medicine

## 2023-08-11 DIAGNOSIS — R42 Dizziness and giddiness: Secondary | ICD-10-CM

## 2023-08-18 ENCOUNTER — Ambulatory Visit: Admitting: Nurse Practitioner

## 2023-08-22 ENCOUNTER — Ambulatory Visit (INDEPENDENT_AMBULATORY_CARE_PROVIDER_SITE_OTHER): Admitting: Family Medicine

## 2023-08-22 ENCOUNTER — Encounter: Payer: Self-pay | Admitting: Family Medicine

## 2023-08-22 VITALS — BP 129/72 | HR 71 | Ht 72.0 in | Wt 183.0 lb

## 2023-08-22 DIAGNOSIS — R42 Dizziness and giddiness: Secondary | ICD-10-CM | POA: Diagnosis not present

## 2023-08-22 NOTE — Progress Notes (Signed)
 BP 129/72   Pulse 71   Ht 6' (1.829 m)   Wt 183 lb (83 kg)   SpO2 97%   BMI 24.82 kg/m    Subjective:   Patient ID: Anthony Kelly, male    DOB: 04/22/1945, 78 y.o.   MRN: [id number]  HPI: Anthony Kelly is a 78 y.o. male presenting on 08/22/2023 for Dizziness (Needs referral to neuro)   Discussed the use of AI scribe software for clinical note transcription with the patient, who gave verbal consent to proceed.  History of Present Illness   Anthony Kelly is a 78 year old male who presents with recurrent episodes of vertigo and dizziness.  He has been experiencing recurrent episodes of vertigo and dizziness for the past few months, typically occurring when lying on his back, such as when working on a [employer], mining in the garage. These episodes have occurred approximately three times in the past couple of months, roughly once a month.  During these episodes, he experiences severe symptoms including vomiting and difficulty walking, describing it as 'walking into walls'. The vertigo is characterized by a sensation of spinning, and he sometimes needs to sit for about thirty minutes before he can get up. The symptoms resolve completely after the episode passes.  He has been using exercises provided previously, which he finds helpful, and has been prescribed meclizine  (Antivert ) for these episodes. He received one refill of meclizine . He describes the medication as calming the symptoms slightly, similar to motion sickness medication.  No associated headaches, vision changes, numbness, weakness, or stroke-like symptoms during these episodes. He also reports no recent changes in medications or diet that could have triggered these episodes.  He is currently on Revlimid  and recently started on flecainide  a couple of months ago for atrial fibrillation. However, the vertigo episodes began before starting flecainide .          Relevant past medical, surgical, family and social history reviewed  and updated as indicated. Interim medical history since our last visit reviewed. Allergies and medications reviewed and updated.  Review of Systems  Constitutional:  Negative for chills and fever.  Eyes:  Negative for visual disturbance.  Respiratory:  Negative for shortness of breath and wheezing.   Cardiovascular:  Negative for chest pain and leg swelling.  Musculoskeletal:  Negative for back pain and gait problem.  Skin:  Negative for rash.  Neurological:  Positive for dizziness. Negative for weakness, light-headedness and numbness.  All other systems reviewed and are negative.   Per HPI unless specifically indicated above   Allergies as of 08/22/2023       Reactions   Rituximab  Other (See Comments)   Throat tightness and facial flushing. Patient had hypersensitivty reaction to rapid rituximab . See progress note dated 10/21/2023        Medication List        Accurate as of August 22, 2023  4:26 PM. If you have any questions, ask your nurse or doctor.          acetaminophen  500 MG tablet Commonly known as: TYLENOL  Take 500-1,000 mg by mouth as needed for moderate pain (pain score 4-6).   acyclovir  400 MG tablet Commonly known as: ZOVIRAX  TAKE 1 TABLET(400 MG) BY MOUTH DAILY   BEET ROOT PO Take 1 Dose by mouth daily.   brimonidine  0.2 % ophthalmic solution Commonly known as: ALPHAGAN  Place 1 drop into both eyes 2 (two) times daily.   Calcium  1200 1200-1000 MG-UNIT Chew Chew 1 tablet by  mouth daily.   cyclobenzaprine  5 MG tablet Commonly known as: FLEXERIL  Take 1 tablet (5 mg total) by mouth 3 (three) times daily as needed for muscle spasms.   Eliquis  5 MG Tabs tablet Generic drug: apixaban  TAKE 1 TABLET TWICE A DAY   finasteride  5 MG tablet Commonly known as: PROSCAR  Take 1 tablet (5 mg total) by mouth daily.   flecainide  50 MG tablet Commonly known as: TAMBOCOR  Take 1 tablet (50 mg total) by mouth 2 (two) times daily.   fluorouracil 5 %  cream Commonly known as: EFUDEX Apply topically as needed.   lenalidomide  15 MG capsule Commonly known as: Revlimid  TAKE 1 CAPSULE DAILY FOR 21 DAYS ON, THEN 7 DAYS OFF. REPEAT EVERY 28 DAYS   Lumigan  0.01 % Soln Generic drug: bimatoprost  Place 1 drop into both eyes at bedtime.   meclizine  25 MG tablet Commonly known as: ANTIVERT  Take 1 tablet (25 mg total) by mouth as needed for dizziness.   metoprolol  succinate 25 MG 24 hr tablet Commonly known as: TOPROL -XL Take 0.5 tablets (12.5 mg total) by mouth 2 (two) times daily. (May take an additional 1/2 tab as needed for palpitations   mometasone 0.1 % cream Commonly known as: ELOCON Apply 1 Application topically as needed.   multivitamin with minerals Tabs tablet Take 1 tablet by mouth daily.   NASACORT  ALLERGY 24HR NA Place 1 spray into the nose as needed.   omeprazole  20 MG tablet Commonly known as: PRILOSEC  OTC Take 20 mg by mouth every other day.   ondansetron  8 MG tablet Commonly known as: ZOFRAN  Take 8 mg by mouth as needed for nausea or vomiting.   predniSONE  20 MG tablet Commonly known as: DELTASONE  2 po at same time daily for 5 days   prochlorperazine  10 MG tablet Commonly known as: COMPAZINE  Take 10 mg by mouth as needed for nausea or vomiting.   rosuvastatin  20 MG tablet Commonly known as: CRESTOR  TAKE 1 TABLET(20 MG) BY MOUTH DAILY   SYSTANE OP Place 1 drop into both eyes daily as needed (dry eyes).   traMADol  50 MG tablet Commonly known as: Ultram  Take 2 tablets (100 mg total) by mouth every 12 (twelve) hours as needed. What changed: when to take this   vitamin C 1000 MG tablet Take 1,000 mg by mouth daily.         Objective:   BP 129/72   Pulse 71   Ht 6' (1.829 m)   Wt 183 lb (83 kg)   SpO2 97%   BMI 24.82 kg/m   Wt Readings from Last 3 Encounters:  08/22/23 183 lb (83 kg)  07/28/23 183 lb (83 kg)  05/23/23 186 lb 3.2 oz (84.5 kg)    Physical Exam HENT:     Mouth/Throat:      Mouth: Mucous membranes are moist.     Pharynx: Oropharynx is clear. No oropharyngeal exudate or posterior oropharyngeal erythema.  Eyes:     Conjunctiva/sclera: Conjunctivae normal.     Pupils: Pupils are equal, round, and reactive to light.    Physical Exam   HEENT: Pharynx normal. NECK: Thyroid  normal. CHEST: Lungs clear to auscultation bilaterally. CARDIOVASCULAR: Heart regular rate, slower pace due to AFib. NEUROLOGICAL: Pupils reactive to light, EOMI. Strength 5/5 in all extremities. Sensation intact bilaterally.         Assessment & Plan:   Problem List Items Addressed This Visit   None Visit Diagnoses       Vertigo    -  Primary   Relevant Orders   Ambulatory referral to Neurology           Recurrent benign paroxysmal vertigo Recurrent vertigo episodes. Differential includes Meniere's disease. Current management with vestibular exercises and meclizine  provides partial relief. - Submit neurology referral for further evaluation, dizziness rehabilitation, and Meniere's disease testing. - Advise continuation of vestibular exercises, focus on left side. - Prescribe meclizine  as needed for vertigo. - Discuss over-the-counter options like Dramamine. - Instruct to contact if symptoms worsen or additional medication needed.  Atrial fibrillation Atrial fibrillation well-managed with flecainide , stable heart rate and rhythm.  General Health Maintenance Continues on Revlimid  without changes.          Follow up plan: Return if symptoms worsen or fail to improve.  Counseling provided for all of the vaccine components Orders Placed This Encounter  Procedures   Ambulatory referral to Neurology    Fonda Levins, MD [facility] 08/22/2023, 4:26 PM

## 2023-08-31 ENCOUNTER — Other Ambulatory Visit: Payer: Self-pay | Admitting: *Deleted

## 2023-08-31 MED ORDER — LENALIDOMIDE 15 MG PO CAPS: ORAL_CAPSULE | ORAL | 0 refills | Status: DC

## 2023-09-02 ENCOUNTER — Ambulatory Visit: Attending: Cardiology | Admitting: Cardiology

## 2023-09-02 ENCOUNTER — Other Ambulatory Visit (HOSPITAL_BASED_OUTPATIENT_CLINIC_OR_DEPARTMENT_OTHER): Payer: Self-pay

## 2023-09-02 ENCOUNTER — Encounter: Payer: Self-pay | Admitting: Cardiology

## 2023-09-02 VITALS — BP 108/58 | HR 64 | Ht 72.0 in | Wt 186.0 lb

## 2023-09-02 DIAGNOSIS — I2510 Atherosclerotic heart disease of native coronary artery without angina pectoris: Secondary | ICD-10-CM | POA: Diagnosis not present

## 2023-09-02 DIAGNOSIS — I10 Essential (primary) hypertension: Secondary | ICD-10-CM | POA: Diagnosis not present

## 2023-09-02 DIAGNOSIS — I480 Paroxysmal atrial fibrillation: Secondary | ICD-10-CM | POA: Diagnosis not present

## 2023-09-02 DIAGNOSIS — E782 Mixed hyperlipidemia: Secondary | ICD-10-CM | POA: Diagnosis not present

## 2023-09-02 NOTE — Progress Notes (Signed)
 Clinical Summary Mr. Bob is a 78 y.o.male seen today for follow up of the following medical problems.    1. PAF - followed by EP - 02/17/22 had afib ablation with Dr Nancey - previously failed flecanide, from 05/2023 note afib clinic retrying.    - no recent palpitations, has done well so far back on flecanide.  - compliant with meds - no bleeding on eliquis .      2. CAD - Coronary CT angiography demonstrated nonobstructive coronary artery disease in the LAD, left circumflex, and RCA. - 05/2019 cath: prox LAD 25%, ramus 50%, RCA 25% - 03/2019 TC 137 TG 75 HDL 61 LDL 61 - 09/2020 TC 890 TG 871 HDL 49 LDL 38  08/2020 echo LVEF 60-65%, no WMAs   - denies any chest pains.    3. NSVT -denies any symptoms     4. Lymphoma - followed by oncology - currently in remission.    5. Double vision - transient episode, seen in ER 07/24/20 - MRI/MRA no acute process, no significant cerebrovascular disease - followed by optho, pcp   6. HTN -compliant with meds   7. 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 04/2013 -adm with CP, elevated CRP. H/o coronary artery calcification on prior CT but nuc was negative, EF 71%.   Arthritis    Atrial fibrillation (HCC)    on Metoprolol  and Eliquis  for this  a. Isolated episode in the setting of acute pericarditis 04/2013. Was not placed on anticoag.   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 Patient had hypersensitivty reaction to rapid rituximab . See progress note dated 10/21/2023     Current Outpatient Medications  Medication Sig Dispense Refill   acetaminophen  (TYLENOL ) 500 MG tablet Take 500-1,000 mg by mouth as needed for moderate pain (pain score 4-6).     acyclovir  (ZOVIRAX ) 400 MG tablet TAKE 1 TABLET(400 MG) BY MOUTH DAILY 90 tablet 1   apixaban  (ELIQUIS ) 5 MG TABS tablet TAKE 1 TABLET TWICE A DAY 180 tablet 1   Ascorbic Acid (VITAMIN C) 1000 MG tablet Take 1,000 mg by mouth daily.     brimonidine  (ALPHAGAN ) 0.2 % ophthalmic solution Place 1 drop into  both eyes 2 (two) times daily.      Calcium  Carbonate-Vit D-Min (CALCIUM  1200) 1200-1000 MG-UNIT CHEW Chew 1 tablet by mouth daily.     cyclobenzaprine  (FLEXERIL ) 5 MG tablet Take 1 tablet (5 mg total) by mouth 3 (three) times daily as needed for muscle spasms. 30 tablet 0   finasteride  (PROSCAR ) 5 MG tablet Take 1 tablet (5 mg total) by mouth daily. 90 tablet 3   flecainide  (TAMBOCOR ) 50 MG tablet Take 1 tablet (50 mg total) by mouth 2 (two) times daily. 180 tablet 2   fluorouracil (EFUDEX) 5 % cream Apply topically as needed.     lenalidomide  (REVLIMID ) 15 MG capsule TAKE 1 CAPSULE DAILY FOR 21 DAYS ON, THEN 7 DAYS OFF. REPEAT EVERY 28 DAYS 21 capsule 0   LUMIGAN  0.01 % SOLN Place 1 drop into both eyes at bedtime.     meclizine  (ANTIVERT ) 25 MG tablet Take 1 tablet (25 mg  total) by mouth as needed for dizziness. 30 tablet 1   metoprolol  succinate (TOPROL -XL) 25 MG 24 hr tablet Take 0.5 tablets (12.5 mg total) by mouth 2 (two) times daily. (May take an additional 1/2 tab as needed for palpitations 135 tablet 1   Misc Natural Products (BEET ROOT PO) Take 1 Dose by mouth daily.     mometasone (ELOCON) 0.1 % cream Apply 1 Application topically as needed.     Multiple Vitamin (MULTIVITAMIN WITH MINERALS) TABS tablet Take 1 tablet by mouth daily.      omeprazole  (PRILOSEC  OTC) 20 MG tablet Take 20 mg by mouth every other day.     ondansetron  (ZOFRAN ) 8 MG tablet Take 8 mg by mouth as needed for nausea or vomiting.     Polyethyl Glycol-Propyl Glycol (SYSTANE OP) Place 1 drop into both eyes daily as needed (dry eyes).     predniSONE  (DELTASONE ) 20 MG tablet 2 po at same time daily for 5 days 10 tablet 0   prochlorperazine  (COMPAZINE ) 10 MG tablet Take 10 mg by mouth as needed for nausea or vomiting.     rosuvastatin  (CRESTOR ) 20 MG tablet TAKE 1 TABLET(20 MG) BY MOUTH DAILY 90 tablet 1   traMADol  (ULTRAM ) 50 MG tablet Take 2 tablets (100 mg total) by mouth every 12 (twelve) hours as needed. (Patient taking differently: Take 100 mg by mouth as needed.) 60 tablet 0   Triamcinolone  Acetonide (NASACORT  ALLERGY 24HR NA) Place 1 spray into the nose as needed.     No current facility-administered medications for this visit.     Past Surgical History:  Procedure Laterality Date   ATRIAL FIBRILLATION ABLATION N/A 02/17/2022   Procedure: ATRIAL FIBRILLATION ABLATION;  Surgeon: Nancey Eulas BRAVO, MD;  Location: [facility];  Service: Cardiovascular;  Laterality: N/A;   CHOLECYSTECTOMY     CYSTOSCOPY N/A 09/09/2022   Procedure: CYSTOSCOPY;  Surgeon: Sherrilee Belvie CROME, MD;  Location: [facility];  Service: Urology;  Laterality: N/A;   EXPLORATORY LAPAROTOMY     EYE SURGERY Right 2016   cataract   LEFT HEART CATH AND CORONARY ANGIOGRAPHY N/A 06/05/2019   Procedure: LEFT  HEART CATH AND CORONARY ANGIOGRAPHY;  Surgeon: Dann Candyce RAMAN, MD;  Location: [facility] INVASIVE CV LAB;  Service: Cardiovascular;  Laterality: N/A;   LUNG LOBECTOMY     right side   LYMPH NODE BIOPSY     in groin with removal   MENISCUS REPAIR     right knee   MOHS SURGERY Left 09/2020   cheek  PROSTATE SURGERY     REFRACTIVE SURGERY Right    piece of metal removed   SUBMANDIBULAR GLAND EXCISION  04/2011   SUBMANDIBULAR GLAND EXCISION  04/30/2011   Procedure: EXCISION SUBMANDIBULAR GLAND;  Surgeon: Alm Bouche, MD;  Location: [facility] OR;  Service: ENT;  Laterality: Left;  WITH DIAGNOSTIC BIOPSY   TONSILLECTOMY     as a child   TRANSURETHRAL RESECTION OF PROSTATE N/A 09/09/2022   Procedure: TRANSURETHRAL RESECTION OF THE PROSTATE (TURP);  Surgeon: Sherrilee Belvie CROME, MD;  Location: [facility];  Service: Urology;  Laterality: N/A;   VEIN LIGATION AND STRIPPING     right leg     Allergies  Allergen Reactions   Rituximab  Other (See Comments)    Throat tightness and facial flushing. Patient had hypersensitivty reaction to rapid rituximab . See progress note dated 10/21/2023      Family History  Problem Relation Age of Onset   Heart disease Father    Heart attack Father        x 3   Cancer Father    Cancer Sister        breast ca   Heart attack Sister    Cancer Sister        breast   Alzheimer's disease Sister    Diabetes Sister    Cancer Brother        prostate ca   Heart disease Brother    Heart disease Brother    Diabetes Brother    Heart disease Brother    Colon cancer Brother    Heart disease Brother    Heart disease Brother    Heart disease Brother    Heart disease Brother    Alzheimer's disease Brother    Cancer Brother    Diabetes Son    Anesthesia problems Neg Hx    Esophageal cancer Neg Hx    Rectal cancer Neg Hx    Stomach cancer Neg Hx      Social History Mr. Warshaw reports that he quit smoking about 39 years ago. His smoking use included  cigarettes. He started smoking about 60 years ago. He has a 21.7 pack-year smoking history. He has never used smokeless tobacco. Mr. Slagter reports that he does not currently use alcohol.    Physical Examination Vitals:   09/02/23 0856 09/02/23 0920  BP: (!) 98/52 (!) 108/58  Pulse: 64   SpO2: 96%    Filed Weights   09/02/23 0856  Weight: 186 lb (84.4 kg)    Gen: resting comfortably, no acute distress HEENT: no scleral icterus, pupils equal round and reactive, no palptable cervical adenopathy,  CV: RRR, no m/rg, no jvd Resp: Clear to auscultation bilaterally GI: abdomen is soft, non-tender, non-distended, normal bowel sounds, no hepatosplenomegaly MSK: extremities are warm, no edema.  Skin: warm, no rash Neuro:  no focal deficits Psych: appropriate affect   Diagnostic Studies  Coronary CT angiography May 19, 2019:   1. Left Main:  No significant stenosis.   2. LAD: No significant stenosis.  FFR 0.9 proximal, 0.84 distal 3. LCX: No significant stenosis. FFR 0.9 in the mid vessel, 0.84 distally 4. RCA: No significant stenosis. FFR 0.9 proximally, 0.87 mid, 0.80 distally   IMPRESSION: 1.  CT FFR analysis didn't show any significant stenosis.     Echocardiogram 05/22/2019:   1. Left ventricular ejection fraction, by estimation, is 60 to 65%. The  left ventricle has normal function. The left ventricle has no regional  wall motion abnormalities. There is mild left  ventricular hypertrophy.  Left ventricular diastolic parameters  are indeterminate.   2. Right ventricular systolic function is normal. The right ventricular  size is mildly enlarged. Mildly increased right ventricular wall  thickness. Tricuspid regurgitation signal is inadequate for assessing PA  pressure.   3. Left atrial size was mildly dilated.   4. The mitral valve is grossly normal. Trivial mitral valve  regurgitation.   5. The aortic valve is tricuspid. Aortic valve regurgitation is not  visualized.    6. The inferior vena cava is normal in size with greater than 50%  respiratory variability, suggesting right atrial pressure of 3 mmHg.   08/2020 echo IMPRESSIONS     1. Left ventricular ejection fraction, by estimation, is 60 to 65%. The  left ventricle has normal function. The left ventricle has no regional  wall motion abnormalities. There is mild left ventricular hypertrophy.  Left ventricular diastolic parameters  were normal.   2. Right ventricular systolic function is normal. The right ventricular  size is normal. Tricuspid regurgitation signal is inadequate for assessing  PA pressure.   3. The mitral valve is normal in structure. No evidence of mitral valve  regurgitation. No evidence of mitral stenosis.   4. The aortic valve is tricuspid. Aortic valve regurgitation is not  visualized. No aortic stenosis is present.   5. The inferior vena cava is normal in size with greater than 50%  respiratory variability, suggesting right atrial pressure of 3 mmHg.       Assessment and Plan   1. PAF/acquired thrombophilia - s/p recent ablation - recurrent symptoms, afib clinic has restarted flecanide, symptoms improved back on medications - continue currrent meds - EKG today shows SR, PACs   2. HTN - bp at goal, continue current meds   3. Hyperlipidemia - very well controlled continue current meds   4. CAD -no symptoms, continue current meds   F/u 6 months     Dorn PHEBE Ross, M.D.

## 2023-09-02 NOTE — Patient Instructions (Signed)
 Medication Instructions:  Your physician recommends that you continue on your current medications as directed. Please refer to the Current Medication list given to you today.   Labwork: None  Testing/Procedures: None  Follow-Up: Your physician recommends that you schedule a follow-up appointment in: 6 months  Any Other Special Instructions Will Be Listed Below (If Applicable). Thank you for choosing [facility]!     If you need a refill on your cardiac medications before your next appointment, please call your pharmacy.

## 2023-09-05 ENCOUNTER — Other Ambulatory Visit: Payer: Self-pay

## 2023-09-05 ENCOUNTER — Encounter: Payer: Self-pay | Admitting: Hematology and Oncology

## 2023-09-05 MED ORDER — LENALIDOMIDE 15 MG PO CAPS: ORAL_CAPSULE | ORAL | 0 refills | Status: DC

## 2023-09-16 ENCOUNTER — Other Ambulatory Visit: Payer: Self-pay | Admitting: Hematology and Oncology

## 2023-09-16 DIAGNOSIS — C8280 Other types of follicular lymphoma, unspecified site: Secondary | ICD-10-CM

## 2023-09-21 ENCOUNTER — Encounter: Payer: Self-pay | Admitting: Hematology and Oncology

## 2023-09-21 ENCOUNTER — Ambulatory Visit (INDEPENDENT_AMBULATORY_CARE_PROVIDER_SITE_OTHER)
Admission: RE | Admit: 2023-09-21 | Discharge: 2023-09-21 | Disposition: A | Discharge status: Home / Self Care | Source: Ambulatory Visit | Attending: Internal Medicine | Admitting: Internal Medicine

## 2023-09-21 ENCOUNTER — Inpatient Hospital Stay: Attending: Hematology and Oncology

## 2023-09-21 ENCOUNTER — Encounter (HOSPITAL_COMMUNITY): Payer: Self-pay | Admitting: Internal Medicine

## 2023-09-21 ENCOUNTER — Other Ambulatory Visit: Payer: Self-pay | Admitting: *Deleted

## 2023-09-21 ENCOUNTER — Inpatient Hospital Stay (HOSPITAL_BASED_OUTPATIENT_CLINIC_OR_DEPARTMENT_OTHER): Admitting: Hematology and Oncology

## 2023-09-21 VITALS — BP 132/70 | HR 70 | Temp 97.9°F | Resp 17 | Ht 72.0 in | Wt 184.6 lb

## 2023-09-21 VITALS — BP 112/76 | HR 69 | Ht 72.0 in | Wt 184.6 lb

## 2023-09-21 DIAGNOSIS — Z9221 Personal history of antineoplastic chemotherapy: Secondary | ICD-10-CM | POA: Diagnosis not present

## 2023-09-21 DIAGNOSIS — C8280 Other types of follicular lymphoma, unspecified site: Secondary | ICD-10-CM

## 2023-09-21 DIAGNOSIS — Z5181 Encounter for therapeutic drug level monitoring: Secondary | ICD-10-CM | POA: Insufficient documentation

## 2023-09-21 DIAGNOSIS — Z79899 Other long term (current) drug therapy: Secondary | ICD-10-CM | POA: Insufficient documentation

## 2023-09-21 DIAGNOSIS — I480 Paroxysmal atrial fibrillation: Secondary | ICD-10-CM | POA: Insufficient documentation

## 2023-09-21 DIAGNOSIS — D6869 Other thrombophilia: Secondary | ICD-10-CM | POA: Diagnosis not present

## 2023-09-21 DIAGNOSIS — C828A Other types of follicular lymphoma, in remission: Secondary | ICD-10-CM | POA: Insufficient documentation

## 2023-09-21 DIAGNOSIS — D696 Thrombocytopenia, unspecified: Secondary | ICD-10-CM | POA: Insufficient documentation

## 2023-09-21 DIAGNOSIS — R21 Rash and other nonspecific skin eruption: Secondary | ICD-10-CM | POA: Insufficient documentation

## 2023-09-21 LAB — CMP (CANCER CENTER ONLY)
ALT: 19 U/L (ref 0–44)
AST: 16 U/L (ref 15–41)
Albumin: 4 g/dL (ref 3.5–5.0)
Alkaline Phosphatase: 64 U/L (ref 38–126)
Anion gap: 7 (ref 5–15)
BUN: 13 mg/dL (ref 8–23)
CO2: 30 mmol/L (ref 22–32)
Calcium: 9.1 mg/dL (ref 8.9–10.3)
Chloride: 103 mmol/L (ref 98–111)
Creatinine: 0.99 mg/dL (ref 0.61–1.24)
GFR, Estimated: >60 mL/min (ref >60)
Glucose, Bld: 185 mg/dL — ABNORMAL HIGH (ref 70–99)
Potassium: 4.5 mmol/L (ref 3.5–5.1)
Sodium: 140 mmol/L (ref 135–145)
Total Bilirubin: 0.8 mg/dL (ref 0.0–1.2)
Total Protein: 6.3 g/dL — ABNORMAL LOW (ref 6.5–8.1)

## 2023-09-21 LAB — CBC WITH DIFFERENTIAL (CANCER CENTER ONLY)
Abs Immature Granulocytes: 0.03 K/uL (ref 0.00–0.07)
Basophils Absolute: 0.1 K/uL (ref 0.0–0.1)
Basophils Relative: 1 %
Eosinophils Absolute: 0.3 K/uL (ref 0.0–0.5)
Eosinophils Relative: 4 %
HCT: 42.3 % (ref 39.0–52.0)
Hemoglobin: 14.3 g/dL (ref 13.0–17.0)
Immature Granulocytes: 0 %
Lymphocytes Relative: 13 %
Lymphs Abs: 1 K/uL (ref 0.7–4.0)
MCH: 30.4 pg (ref 26.0–34.0)
MCHC: 33.8 g/dL (ref 30.0–36.0)
MCV: 90 fL (ref 80.0–100.0)
Monocytes Absolute: 1.2 K/uL — ABNORMAL HIGH (ref 0.1–1.0)
Monocytes Relative: 16 %
Neutro Abs: 5.2 K/uL (ref 1.7–7.7)
Neutrophils Relative %: 66 %
Platelet Count: 141 K/uL — ABNORMAL LOW (ref 150–400)
RBC: 4.7 MIL/uL (ref 4.22–5.81)
RDW: 14.6 % (ref 11.5–15.5)
WBC Count: 7.8 K/uL (ref 4.0–10.5)
nRBC: 0 % (ref 0.0–0.2)

## 2023-09-21 MED ORDER — LENALIDOMIDE 15 MG PO CAPS: ORAL_CAPSULE | ORAL | 0 refills | Status: DC

## 2023-09-21 NOTE — Progress Notes (Addendum)
 Primary Care Physician: Dettinger, Fonda LABOR, MD Primary Cardiologist: Dr Dorn Ross Primary Electrophysiologist: Dr Nancey Referring Physician: Dr Nancey Ned Miklas is a 78 y.o. male with a history of CAD, lymphoma, NSVT, atrial fibrillation who presents for follow up in the [facility].  The patient had been maintained on flecainide  but was discontinued due to his diagnosis of CAD. Patient is on Eliquis  for a CHADS2VASC score of 3. Patient underwent afib ablation with Dr Nancey on 02/17/22.  On follow up 05/23/23, he is currently in NSR. He has had several episodes of Afib since last office visit. He notes that episodes of Afib tend to occur with exertion. He wears an Apple watch which has confirmed Afib episodes. They are brief but affect his quality of life in a negative way. No bleeding issues on Eliquis .   On follow up 09/21/23, patient is here for flecainide  surveillance. He is currently in NSR. He has noted overall low Afib burden since last office visit. No missed doses of flecainide  or Eliquis .   Today, he denies symptoms of palpitations, chest pain, shortness of breath, orthopnea, PND, lower extremity edema, dizziness, presyncope, syncope, bleeding, or neurologic sequela. The patient is tolerating medications without difficulties and is otherwise without complaint today.    Atrial Fibrillation Risk Factors:  he does not have a history of rheumatic fever.   he has a BMI of Body mass index is 25.04 kg/m.SABRA Filed Weights   09/21/23 1027  Weight: 83.7 kg     Family History  Problem Relation Age of Onset   Heart disease Father    Heart attack Father        x 3   Cancer Father    Cancer Sister        breast ca   Heart attack Sister    Cancer Sister        breast   Alzheimer's disease Sister    Diabetes Sister    Cancer Brother        prostate ca   Heart disease Brother    Heart disease Brother    Diabetes Brother    Heart disease  Brother    Colon cancer Brother    Heart disease Brother    Heart disease Brother    Heart disease Brother    Heart disease Brother    Alzheimer's disease Brother    Cancer Brother    Diabetes Son    Anesthesia problems Neg Hx    Esophageal cancer Neg Hx    Rectal cancer Neg Hx    Stomach cancer Neg Hx      Atrial Fibrillation Management history:  Previous antiarrhythmic drugs: flecainide  Previous cardioversions: none Previous ablations: 02/17/22 CHADS2VASC score: 3 Anticoagulation history: Eliquis    Past Medical History:  Diagnosis Date   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 04/2013 -adm with CP, elevated CRP. H/o coronary artery calcification on prior CT but nuc was negative, EF 71%.   Arthritis    Atrial fibrillation (HCC)    on Metoprolol  and Eliquis  for this  a. Isolated episode in the setting of acute pericarditis 04/2013. Was not placed on anticoag.   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   Metastasis to lung (HCC) dx'd 01/2007   Metastasis to lymph nodes (HCC) dx'd 03/2011   lt submandibular ln   Pain in joint, pelvic region and thigh 08/01/2013   PSVT (paroxysmal supraventricular tachycardia) (HCC)    SCC (squamous cell carcinoma)    Left cheek   Past Surgical History:  Procedure Laterality Date   ATRIAL FIBRILLATION ABLATION N/A 02/17/2022   Procedure: ATRIAL FIBRILLATION ABLATION;  Surgeon: Nancey Eulas BRAVO, MD;   Location: [facility] INVAS[facility] CV [facility];  Service: Cardiovascular;  Laterality: N/A;   CHOLECYSTECTOMY     CYSTOSCOPY N/A 09/09/2022   Procedure: CYSTOSCOPY;  Surgeon: Sherrilee Belvie CROME, MD;  Location: [facility];  Service: Urology;  Laterality: N/A;   EXPLORATORY LAPAROTOMY     EYE SURGERY Right 2016   cataract   LEFT HEART CATH AND CORONARY ANGIOGRAPHY N/A 06/05/2019   Procedure: LEFT HEART CATH AND CORONARY ANGIOGRAPHY;  Surgeon: Dann Candyce RAMAN, MD;  Location: [facility] INVASIVE CV LAB;  Service: Cardiovascular;  Laterality: N/A;   LUNG LOBECTOMY     right side   LYMPH NODE BIOPSY     in groin with removal   MENISCUS REPAIR     right knee   MOHS SURGERY Left 09/2020   cheek   PROSTATE SURGERY     REFRACTIVE SURGERY Right    piece of metal removed   SUBMANDIBULAR GLAND EXCISION  04/2011   SUBMANDIBULAR GLAND EXCISION  04/30/2011   Procedure: EXCISION SUBMANDIBULAR GLAND;  Surgeon: Alm Bouche, MD;  Location: [facility] OR;  Service: ENT;  Laterality: Left;  WITH DIAGNOSTIC BIOPSY   TONSILLECTOMY     as a child   TRANSURETHRAL RESECTION OF PROSTATE N/A 09/09/2022   Procedure: TRANSURETHRAL RESECTION OF THE PROSTATE (TURP);  Surgeon: Sherrilee Belvie CROME, MD;  Location: [facility];  Service: Urology;  Laterality: N/A;   VEIN LIGATION AND STRIPPING     right leg    Current Outpatient Medications  Medication Sig Dispense Refill   acetaminophen  (TYLENOL ) 500 MG tablet Take 500-1,000 mg by mouth as needed for moderate pain (pain score 4-6).     acyclovir  (ZOVIRAX ) 400 MG tablet TAKE 1 TABLET(400 MG) BY MOUTH DAILY 90 tablet 1   apixaban  (ELIQUIS ) 5 MG TABS tablet TAKE 1 TABLET TWICE A DAY 180 tablet 1   Ascorbic Acid (VITAMIN C) 1000 MG tablet Take 1,000 mg by mouth daily.     brimonidine  (ALPHAGAN ) 0.2 % ophthalmic solution Place 1 drop into both eyes 2 (two) times daily.      Calcium  Carbonate-Vit D-Min (CALCIUM  1200) 1200-1000 MG-UNIT CHEW Chew 1 tablet by mouth daily.     cyclobenzaprine  (FLEXERIL ) 5  MG tablet Take 1 tablet (5 mg total) by mouth 3 (three) times daily as needed for muscle spasms. 30 tablet 0   finasteride  (PROSCAR ) 5 MG tablet Take 1 tablet (5 mg total) by mouth daily. 90 tablet 3   flecainide  (TAMBOCOR ) 50 MG tablet Take 1 tablet (50 mg total) by mouth 2 (two) times daily. 180 tablet 2   fluorouracil (EFUDEX) 5 % cream Apply topically as needed.     lenalidomide  (REVLIMID ) 15 MG capsule TAKE 1 CAPSULE DAILY FOR 21  DAYS ON, THEN 7 DAYS OFF. REPEAT EVERY 28 DAYS 21 capsule 0   LUMIGAN  0.01 % SOLN Place 1 drop into both eyes at bedtime.     meclizine  (ANTIVERT ) 25 MG tablet Take 1 tablet (25 mg total) by mouth as needed for dizziness. 30 tablet 1   metoprolol  succinate (TOPROL -XL) 25 MG 24 hr tablet Take 0.5 tablets (12.5 mg total) by mouth 2 (two) times daily. (May take an additional 1/2 tab as needed for palpitations 135 tablet 1   Misc Natural Products (BEET ROOT PO) Take 1 Dose by mouth daily.     mometasone (ELOCON) 0.1 % cream Apply 1 Application topically as needed.     Multiple Vitamin (MULTIVITAMIN WITH MINERALS) TABS tablet Take 1 tablet by mouth daily.      omeprazole  (PRILOSEC  OTC) 20 MG tablet Take 20 mg by mouth every other day.     ondansetron  (ZOFRAN ) 8 MG tablet Take 8 mg by mouth as needed for nausea or vomiting.     Polyethyl Glycol-Propyl Glycol (SYSTANE OP) Place 1 drop into both eyes daily as needed (dry eyes).     prochlorperazine  (COMPAZINE ) 10 MG tablet Take 10 mg by mouth as needed for nausea or vomiting.     rosuvastatin  (CRESTOR ) 20 MG tablet TAKE 1 TABLET(20 MG) BY MOUTH DAILY 90 tablet 1   traMADol  (ULTRAM ) 50 MG tablet Take 2 tablets (100 mg total) by mouth every 12 (twelve) hours as needed. (Patient taking differently: Take 100 mg by mouth as needed.) 60 tablet 0   Triamcinolone  Acetonide (NASACORT  ALLERGY 24HR NA) Place 1 spray into the nose as needed.     No current facility-administered medications for this encounter.    Allergies  Allergen  Reactions   Rituximab  Other (See Comments)    Throat tightness and facial flushing. Patient had hypersensitivty reaction to rapid rituximab . See progress note dated 10/21/2023    ROS- All systems are reviewed and negative except as per the HPI above.  Physical Exam: Vitals:   09/21/23 1027  BP: 112/76  Pulse: 69  Weight: 83.7 kg  Height: 6' (1.829 m)    GEN- The patient is well appearing, alert and oriented x 3 today.   Neck - no JVD or carotid bruit noted Lungs- Clear to ausculation bilaterally, normal work of breathing Heart- Regular rate and rhythm, no murmurs, rubs or gallops, PMI not laterally displaced Extremities- no clubbing, cyanosis, or edema Skin - no rash or ecchymosis noted   Wt Readings from Last 3 Encounters:  09/21/23 83.7 kg  09/21/23 83.7 kg  09/02/23 84.4 kg    EKG today demonstrates  Vent. rate 69 BPM PR interval 170 ms QRS duration 80 ms QT/QTcB 414/443 ms P-R-T axes -5 32 0 Sinus rhythm with Premature atrial complexes Possible Inferior infarct , age undetermined Abnormal ECG When compared with ECG of 02-Sep-2023 09:07, No significant change was found  Echo 09/02/20 demonstrated   1. Left ventricular ejection fraction, by estimation, is 60 to 65%. The  left ventricle has normal function. The left ventricle has no regional  wall motion abnormalities. There is mild left ventricular hypertrophy.  Left ventricular diastolic parameters were normal.   2. Right ventricular systolic function is normal. The right ventricular  size is normal. Tricuspid regurgitation signal is inadequate for assessing  PA pressure.   3. The mitral valve is normal in structure. No evidence of mitral valve  regurgitation. No evidence of mitral stenosis.   4. The aortic valve is  tricuspid. Aortic valve regurgitation is not  visualized. No aortic stenosis is present.   5. The inferior vena cava is normal in size with greater than 50%  respiratory variability, suggesting  right atrial pressure of 3 mmHg.   Epic records are reviewed at length today  CHA2DS2-VASc Score = 5  The patient's score is based upon: CHF History: 0 HTN History: 1 Diabetes History: 1 Stroke History: 0 Vascular Disease History: 1 Age Score: 2 Gender Score: 0       ASSESSMENT AND PLAN: 1. Paroxysmal Atrial Fibrillation (ICD10:  I48.0) The patient's CHA2DS2-VASc score is 5, indicating a 7.2% annual risk of stroke.   S/p afib ablation 02/17/22.  Patient appears to be maintaining normal rhythm.  High risk medication monitoring (ICD10: J342684) Patient requires ongoing monitoring for anti-arrhythmic medication which has the potential to cause life threatening arrhythmias or AV block. ECG intervals are stable. Continue flecainide  50 mg BID.   2. Secondary Hypercoagulable State (ICD10:  D68.69) The patient is at significant risk for stroke/thromboembolism based upon his CHA2DS2-VASc Score of 5.  Continue Apixaban  (Eliquis ).  Continue OAC.   3. CAD Mild non obstructive disease on LHC 2021 No chest pain.   4. Lymphoma Follow[facility] Dr Claudean at [facility]  5. Possible OSA Wife reports patient may stop breathing at night. Will refer for sleep study.    Follow up 6 months for flecainide  surveillance.   Dorn Heinrich, PA-C Afib Clinic [facility] [address] [place], [place] [address] [phone] 09/21/2023 11:11 AM

## 2023-09-21 NOTE — Assessment & Plan Note (Addendum)
 He has mild skin rash on bilateral legs The cause is unknown He has been on lenalidomide  for a long time so I do not suspect this is drug related I recommend the patient reach out to his dermatologist

## 2023-09-21 NOTE — Assessment & Plan Note (Addendum)
 The patient was originally diagnosed with follicular lymphoma in 2004, subsequently transformed into high-grade pathology and was treated successfully with chemotherapy.  He had multiple relapses between 2005-20 25, most recently successfully treated with combination of lenalidomide  and rituximab   Rituximab  was discontinued in December 2024 due to poor tolerance with recurrent facial flushing He tolerated single agent maintenance lenalidomide  well without major side effects  I have reviewed recent blood work and imaging study which showed excellent response to treatment with no signs of recurrence He started to have normal imaging studies since June 2024 I recommend minimum 2 years of treatment until June or 2026 before we decide whether the patient will stay on maintenance lenalidomide  indefinitely versus stopping I plan to repeat imaging study in December He will get labs done locally

## 2023-09-21 NOTE — Progress Notes (Signed)
 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  He had multiple relapses between 2005-20 25, most recently successfully treated with combination of lenalidomide  and rituximab   Rituximab  was discontinued in December 2024 due to poor tolerance with recurrent facial flushing He tolerated single agent maintenance lenalidomide  well without major side effects  I have reviewed recent blood work and imaging study which showed excellent response to treatment with no signs of recurrence He started to have normal imaging studies since June 2024 I recommend minimum 2 years of treatment until June or 2026 before we decide whether the patient will stay on maintenance lenalidomide  indefinitely versus stopping I plan to repeat imaging study in December He will get labs done locally Thrombocytopenia [facility]) This is likely due to recent treatment. The patient denies recent history of bleeding such as epistaxis, hematuria or hematochezia. He is asymptomatic from the low platelet count. I will observe for now.  he does not require transfusion now. I will continue the chemotherapy at current dose without dosage adjustment.  Skin rash He has mild skin rash on bilateral legs The cause is unknown He has been on lenalidomide  for a long time so I do not  suspect this is drug related I recommend the patient reach out to his dermatologist  Orders Placed This Encounter  Procedures   CT CHEST ABDOMEN PELVIS W CONTRAST    Standing Status:   Future    Expected Date:   12/19/2023    Expiration Date:   09/20/2024    If indicated for the ordered procedure, I authorize the administration of contrast media per Radiology protocol:   Yes    Does the patient have a contrast media/X-ray dye allergy?:   No    Preferred imaging location?:   [facility]    If indicated for the ordered procedure, I authorize the administration of oral contrast media per Radiology protocol:   No    Reason for no oral contrast::   no need oral contrast   CBC with Differential/Platelet    Standing Status:   Standing    Number of Occurrences:   22    Expiration Date:   09/20/2024   Comprehensive metabolic panel with GFR    Standing Status:   Standing    Number of Occurrences:   33    Expiration Date:   09/20/2024     Hicks Lonn, MD  INTERVAL HISTORY: he returns for treatment follow-up Complications related to previous cycle of chemotherapy included thrombocytopenia, and rash, He had skin rash started recently Is on both legs He denies new medications No new lymphadenopathy or recent infection  PHYSICAL EXAMINATION: ECOG PERFORMANCE STATUS: 0 - Asymptomatic  No results found for: CAN125    Latest Ref Rng & Units 09/21/2023    8:56 AM 06/13/2023    8:36 AM 05/03/2023   10:00 AM  CBC  WBC 4.0 - 10.5 K/uL 7.8  6.6  6.9   Hemoglobin 13.0 -  17.0 g/dL 85.6  85.1  85.9   Hematocrit 39.0 - 52.0 % 42.3  44.0  40.7   Platelets 150 - 400 K/uL 141  147  134       Chemistry      Component Value Date/Time   NA 140 09/21/2023 0856   NA 139 03/31/2023 1013   NA 139 04/14/2016 0943   K 4.5 09/21/2023 0856   K 4.6 04/14/2016 0943   CL 103 09/21/2023 0856   CL 104 03/24/2012 1024   CO2 30 09/21/2023 0856   CO2 25 04/14/2016 0943   BUN 13 09/21/2023 0856   BUN 16  03/31/2023 1013   BUN 8.6 04/14/2016 0943   CREATININE 0.99 09/21/2023 0856   CREATININE 1.1 04/14/2016 0943      Component Value Date/Time   CALCIUM  9.1 09/21/2023 0856   CALCIUM  9.4 04/14/2016 0943   ALKPHOS 64 09/21/2023 0856   ALKPHOS 71 04/14/2016 0943   AST 16 09/21/2023 0856   AST 28 04/14/2016 0943   ALT 19 09/21/2023 0856   ALT 41 04/14/2016 0943   BILITOT 0.8 09/21/2023 0856   BILITOT 0.61 04/14/2016 0943       Vitals:   09/21/23 0927  BP: 132/70  Pulse: 70  Resp: 17  Temp: 97.9 F (36.6 C)  SpO2: 95%   Filed Weights   09/21/23 0927  Weight: 184 lb 9.6 oz (83.7 kg)  He has mild fine skin rash on both legs and lower torso on his back but not in his front Other relevant data reviewed during this visit included CBC and CMP

## 2023-09-21 NOTE — Assessment & Plan Note (Addendum)
This is likely due to recent treatment. The patient denies recent history of bleeding such as epistaxis, hematuria or hematochezia. He is asymptomatic from the low platelet count. I will observe for now.  he does not require transfusion now. I will continue the chemotherapy at current dose without dosage adjustment.    

## 2023-09-22 ENCOUNTER — Encounter: Payer: Self-pay | Admitting: Cardiology

## 2023-09-22 DIAGNOSIS — L817 Pigmented purpuric dermatosis: Secondary | ICD-10-CM | POA: Diagnosis not present

## 2023-09-22 DIAGNOSIS — D485 Neoplasm of uncertain behavior of skin: Secondary | ICD-10-CM | POA: Diagnosis not present

## 2023-09-22 DIAGNOSIS — L2089 Other atopic dermatitis: Secondary | ICD-10-CM | POA: Diagnosis not present

## 2023-09-29 ENCOUNTER — Encounter: Payer: Self-pay | Admitting: Family Medicine

## 2023-09-29 ENCOUNTER — Ambulatory Visit (INDEPENDENT_AMBULATORY_CARE_PROVIDER_SITE_OTHER): Admitting: Family Medicine

## 2023-09-29 VITALS — BP 114/70 | HR 64 | Ht 72.0 in | Wt 184.0 lb

## 2023-09-29 DIAGNOSIS — E782 Mixed hyperlipidemia: Secondary | ICD-10-CM | POA: Diagnosis not present

## 2023-09-29 DIAGNOSIS — R7303 Prediabetes: Secondary | ICD-10-CM | POA: Diagnosis not present

## 2023-09-29 DIAGNOSIS — I480 Paroxysmal atrial fibrillation: Secondary | ICD-10-CM | POA: Diagnosis not present

## 2023-09-29 DIAGNOSIS — M5136 Other intervertebral disc degeneration, lumbar region with discogenic back pain only: Secondary | ICD-10-CM | POA: Diagnosis not present

## 2023-09-29 LAB — LIPID PANEL

## 2023-09-29 LAB — BAYER DCA HB A1C WAIVED: HB A1C (BAYER DCA - WAIVED): 6.5 % — ABNORMAL HIGH (ref 4.8–5.6)

## 2023-09-29 MED ORDER — TRAMADOL HCL 50 MG PO TABS: 100.0000 mg | ORAL_TABLET | Freq: Two times a day (BID) | ORAL | 0 refills | Status: AC | PRN

## 2023-09-29 NOTE — Progress Notes (Signed)
 BP 114/70   Pulse 64   Ht 6' (1.829 m)   Wt 184 lb (83.5 kg)   SpO2 97%   BMI 24.95 kg/m    Subjective:   Patient ID: Anthony Kelly, male    DOB: [birth year], 78 y.o.   MRN: [id number]  HPI: Anthony Kelly is a 78 y.o. male presenting on 09/29/2023 for Medical Management of Chronic Issues and Hyperlipidemia   Discussed the use of AI scribe software for clinical note transcription with the patient, who gave verbal consent to proceed.  History of Present Illness   Anthony Kelly is a 78 year old male who presents for a recheck of chronic medical issues.  He is managing degenerative disc disease and low back pain with tramadol  as needed, cyclobenzaprine  when necessary, and acetaminophen  regularly. He receives a tramadol  refill every six months for sixty tablets. His last urine drug screen and pain agreement were completed on April 25, 2023. The [place]  Drug database PDMP was reviewed with no abnormalities noted.  He is being monitored for prediabetes and hyperlipidemia. He takes rosuvastatin  for cholesterol management. No issues with blood sugar levels.  He has a history of paroxysmal atrial fibrillation and is under the care of a cardiologist. He is currently on Eliquis , flecainide , and metoprolol  to manage his condition. No problems with these medications and his AFib has been relatively controlled, with the highest rate being six percent for a week.  He takes omeprazole  every other day for heartburn and no longer experiences heartburn.  He is under oncology care for lymphoma, currently in remission, and is scheduled for a scan in December to determine if he can discontinue his current medication regimen.          Relevant past medical, surgical, family and social history reviewed and updated as indicated. Interim medical history since our last visit reviewed. Allergies and medications reviewed and updated.  Review of Systems  Constitutional:  Negative for chills and  fever.  Eyes:  Negative for discharge.  Respiratory:  Negative for shortness of breath and wheezing.   Cardiovascular:  Negative for chest pain and leg swelling.  Musculoskeletal:  Positive for back pain. Negative for arthralgias, gait problem, myalgias and neck pain.  Skin:  Negative for rash.  All other systems reviewed and are negative.   Per HPI unless specifically indicated above   Allergies as of 09/29/2023       Reactions   Rituximab  Other (See Comments)   Throat tightness and facial flushing. Patient had hypersensitivty reaction to rapid rituximab . See progress note dated 10/21/2023        Medication List        Accurate as of September 29, 2023 10:07 AM. If you have any questions, ask your nurse or doctor.          acetaminophen  500 MG tablet Commonly known as: TYLENOL  Take 500-1,000 mg by mouth as needed for moderate pain (pain score 4-6).   acyclovir  400 MG tablet Commonly known as: ZOVIRAX  TAKE 1 TABLET(400 MG) BY MOUTH DAILY   BEET ROOT PO Take 1 Dose by mouth daily.   brimonidine  0.2 % ophthalmic solution Commonly known as: ALPHAGAN  Place 1 drop into both eyes 2 (two) times daily.   Calcium  1200 1200-1000 MG-UNIT Chew Chew 1 tablet by mouth daily.   cyclobenzaprine  5 MG tablet Commonly known as: FLEXERIL  Take 1 tablet (5 mg total) by mouth 3 (three) times daily as needed for muscle spasms.   Eliquis  5  MG Tabs tablet Generic drug: apixaban  TAKE 1 TABLET TWICE A DAY   finasteride  5 MG tablet Commonly known as: PROSCAR  Take 1 tablet (5 mg total) by mouth daily.   flecainide  50 MG tablet Commonly known as: TAMBOCOR  Take 1 tablet (50 mg total) by mouth 2 (two) times daily.   fluorouracil 5 % cream Commonly known as: EFUDEX Apply topically as needed.   lenalidomide  15 MG capsule Commonly known as: Revlimid  TAKE 1 CAPSULE DAILY FOR 21 DAYS ON, THEN 7 DAYS OFF. REPEAT EVERY 28 DAYS   Lumigan  0.01 % Soln Generic drug: bimatoprost  Place 1  drop into both eyes at bedtime.   meclizine  25 MG tablet Commonly known as: ANTIVERT  Take 1 tablet (25 mg total) by mouth as needed for dizziness.   metoprolol  succinate 25 MG 24 hr tablet Commonly known as: TOPROL -XL Take 0.5 tablets (12.5 mg total) by mouth 2 (two) times daily. (May take an additional 1/2 tab as needed for palpitations   mometasone 0.1 % cream Commonly known as: ELOCON Apply 1 Application topically as needed.   multivitamin with minerals Tabs tablet Take 1 tablet by mouth daily.   NASACORT  ALLERGY 24HR NA Place 1 spray into the nose as needed.   omeprazole  20 MG tablet Commonly known as: PRILOSEC  OTC Take 20 mg by mouth every other day.   ondansetron  8 MG tablet Commonly known as: ZOFRAN  Take 8 mg by mouth as needed for nausea or vomiting.   prochlorperazine  10 MG tablet Commonly known as: COMPAZINE  Take 10 mg by mouth as needed for nausea or vomiting.   rosuvastatin  20 MG tablet Commonly known as: CRESTOR  TAKE 1 TABLET(20 MG) BY MOUTH DAILY   SYSTANE OP Place 1 drop into both eyes daily as needed (dry eyes).   traMADol  50 MG tablet Commonly known as: Ultram  Take 2 tablets (100 mg total) by mouth every 12 (twelve) hours as needed. What changed: when to take this   vitamin C 1000 MG tablet Take 1,000 mg by mouth daily.         Objective:   BP 114/70   Pulse 64   Ht 6' (1.829 m)   Wt 184 lb (83.5 kg)   SpO2 97%   BMI 24.95 kg/m   Wt Readings from Last 3 Encounters:  09/29/23 184 lb (83.5 kg)  09/21/23 184 lb 9.6 oz (83.7 kg)  09/21/23 184 lb 9.6 oz (83.7 kg)    Physical Exam Physical Exam   VITALS: BP- 114/70 NECK: Thyroid  normal. No cervical lymphadenopathy. CHEST: Lungs clear to auscultation. CARDIOVASCULAR: No murmurs. EXTREMITIES: No swelling in extremities.         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 Pain management agreement and urine drug screen current. PDMP review normal. - Refill tramadol  prescription.  Paroxysmal  atrial fibrillation Managed by cardiologist with Eliquis , flecainide , and metoprolol . Heart rate controlled, blood pressure 114/70 mmHg.  Prediabetes Borderline prediabetes. Emphasized dietary management, particularly moderation of starches and carbohydrates. - Order A1c test. - Advise on dietary management focusing on moderation of starches and carbohydrates.  Hyperlipidemia Managed with rosuvastatin . - Order cholesterol test.  Gastroesophageal reflux disease (GERD) Managed with omeprazole  every other day.  Lymphoma, in remission In remission. Oncology follow-up ongoing with scan planned for December.        Follow up plan: Return in about 6 months (around 03/28/2024), or if symptoms worsen or fail to improve, for Physical exam and prediabetes chronic medical issues recheck.  Counseling provided for all of the vaccine components Orders Placed This Encounter  Procedures   Bayer DCA Hb A1c Waived   CMP14+EGFR   Lipid panel    Fonda Levins, MD [facility] 09/29/2023, 10:07 AM

## 2023-09-30 LAB — CMP14+EGFR
ALT: 21 IU/L (ref 0–44)
AST: 15 IU/L (ref 0–40)
Albumin: 4.2 g/dL (ref 3.8–4.8)
Alkaline Phosphatase: 82 IU/L (ref 47–123)
BUN/Creatinine Ratio: 11 (ref 10–24)
BUN: 12 mg/dL (ref 8–27)
Bilirubin Total: 0.6 mg/dL (ref 0.0–1.2)
CO2: 22 mmol/L (ref 20–29)
Calcium: 9.2 mg/dL (ref 8.6–10.2)
Chloride: 101 mmol/L (ref 96–106)
Creatinine, Ser: 1.08 mg/dL (ref 0.76–1.27)
Globulin, Total: 2.1 g/dL (ref 1.5–4.5)
Glucose: 134 mg/dL — ABNORMAL HIGH (ref 70–99)
Potassium: 4.7 mmol/L (ref 3.5–5.2)
Sodium: 141 mmol/L (ref 134–144)
Total Protein: 6.3 g/dL (ref 6.0–8.5)
eGFR: 70 mL/min/1.73 (ref >59)

## 2023-09-30 LAB — LIPID PANEL
Cholesterol, Total: 117 mg/dL (ref 100–199)
HDL: 55 mg/dL (ref >39)
LDL CALC COMMENT:: 2.1 ratio (ref 0.0–5.0)
LDL Chol Calc (NIH): 39 mg/dL (ref 0–99)
Triglycerides: 134 mg/dL (ref 0–149)
VLDL Cholesterol Cal: 23 mg/dL (ref 5–40)

## 2023-10-03 ENCOUNTER — Ambulatory Visit: Payer: Self-pay | Admitting: Family Medicine

## 2023-10-06 DIAGNOSIS — L817 Pigmented purpuric dermatosis: Secondary | ICD-10-CM | POA: Diagnosis not present

## 2023-10-10 ENCOUNTER — Other Ambulatory Visit: Payer: Self-pay | Admitting: Cardiology

## 2023-10-10 DIAGNOSIS — I480 Paroxysmal atrial fibrillation: Secondary | ICD-10-CM

## 2023-10-10 NOTE — Telephone Encounter (Signed)
 Eliquis  5mg  refill request received. Patient is 78 years old, weight-83.5kg, Crea-1.08 on 09/29/23, Diagnosis-Afib, and last seen by Fairy Heinrich on 09/21/23. Dose is appropriate based on dosing criteria. Will send in refill to requested pharmacy.

## 2023-10-26 ENCOUNTER — Encounter: Payer: Self-pay | Admitting: Hematology and Oncology

## 2023-10-27 ENCOUNTER — Other Ambulatory Visit: Payer: Self-pay

## 2023-10-27 MED ORDER — LENALIDOMIDE 15 MG PO CAPS: ORAL_CAPSULE | ORAL | 0 refills | Status: DC

## 2023-10-28 ENCOUNTER — Encounter: Payer: Self-pay | Admitting: Hematology and Oncology

## 2023-11-01 ENCOUNTER — Encounter: Payer: Self-pay | Admitting: Cardiovascular Disease

## 2023-11-01 DIAGNOSIS — R29818 Other symptoms and signs involving the nervous system: Secondary | ICD-10-CM

## 2023-11-01 DIAGNOSIS — I480 Paroxysmal atrial fibrillation: Secondary | ICD-10-CM

## 2023-11-02 NOTE — Addendum Note (Signed)
 Addended by: FRANCHOT GLADE RAMAN on: 11/02/2023 03:44 PM   Modules accepted: Orders

## 2023-11-02 NOTE — Addendum Note (Signed)
 Encounter addended by: Terra Fairy PARAS, PA-C on: 11/02/2023 2:21 PM  Actions taken: Clinical Note Signed

## 2023-11-02 NOTE — Addendum Note (Signed)
 Encounter addended by: Terra Fairy PARAS, PA-C on: 11/02/2023 2:22 PM  Actions taken: Clinical Note Signed

## 2023-11-03 DIAGNOSIS — H401131 Primary open-angle glaucoma, bilateral, mild stage: Secondary | ICD-10-CM | POA: Diagnosis not present

## 2023-11-18 DIAGNOSIS — H02125 Mechanical ectropion of left lower eyelid: Secondary | ICD-10-CM | POA: Diagnosis not present

## 2023-11-18 DIAGNOSIS — H04123 Dry eye syndrome of bilateral lacrimal glands: Secondary | ICD-10-CM | POA: Diagnosis not present

## 2023-11-18 DIAGNOSIS — H02122 Mechanical ectropion of right lower eyelid: Secondary | ICD-10-CM | POA: Diagnosis not present

## 2023-11-18 DIAGNOSIS — H2512 Age-related nuclear cataract, left eye: Secondary | ICD-10-CM | POA: Diagnosis not present

## 2023-11-24 ENCOUNTER — Other Ambulatory Visit: Payer: Self-pay

## 2023-11-24 MED ORDER — LENALIDOMIDE 15 MG PO CAPS
ORAL_CAPSULE | ORAL | 0 refills | Status: AC
Start: 2023-11-24 — End: ?

## 2023-12-19 ENCOUNTER — Other Ambulatory Visit: Payer: Self-pay

## 2023-12-19 DIAGNOSIS — C8280 Other types of follicular lymphoma, unspecified site: Secondary | ICD-10-CM | POA: Diagnosis not present

## 2023-12-19 LAB — COMPREHENSIVE METABOLIC PANEL WITH GFR
ALT: 17 IU/L (ref 0–50)
AST: 15 IU/L (ref 15–59)
Albumin: 4.1 g/dL (ref 3.8–4.8)
Alkaline Phosphatase: 74 IU/L (ref 47–123)
BUN/Creatinine Ratio: 14 (ref 10–24)
BUN: 15 mg/dL (ref 8–27)
Bilirubin Total: 0.6 mg/dL (ref 0.0–1.2)
CO2: 26 mmol/L (ref 20–29)
Calcium: 8.8 mg/dL (ref 8.6–10.2)
Chloride: 101 mmol/L (ref 96–106)
Creatinine, Ser: 1.05 mg/dL (ref 0.76–1.27)
Globulin, Total: 1.4 g/dL — ABNORMAL LOW (ref 1.5–4.5)
Glucose: 241 mg/dL — ABNORMAL HIGH (ref 70–99)
Potassium: 4.1 mmol/L (ref 3.5–5.2)
Sodium: 140 mmol/L (ref 134–144)
Total Protein: 5.5 g/dL — ABNORMAL LOW (ref 6.0–8.5)
eGFR: 72 mL/min/1.73 (ref >59)

## 2023-12-19 LAB — CBC WITH DIFFERENTIAL/PLATELET
Basophils Absolute: 0 x10E3/uL (ref 0.0–0.2)
Basos: 0 %
EOS (ABSOLUTE): 0.3 x10E3/uL (ref 0.0–0.4)
Eos: 6 %
Hematocrit: 42.1 % (ref 37.5–51.0)
Hemoglobin: 13.9 g/dL (ref 13.0–17.7)
Lymphocytes Absolute: 1.2 x10E3/uL (ref 0.7–3.1)
Lymphs: 24 %
MCH: 30.4 pg (ref 26.6–33.0)
MCHC: 33 g/dL (ref 31.5–35.7)
MCV: 92 fL (ref 79–97)
Monocytes Absolute: 0.7 x10E3/uL (ref 0.1–0.9)
Monocytes: 14 %
Neutrophils Absolute: 2.8 x10E3/uL (ref 1.4–7.0)
Neutrophils: 56 %
Platelets: 122 x10E3/uL — ABNORMAL LOW (ref 150–450)
RBC: 4.57 x10E6/uL (ref 4.14–5.80)
RDW: 15.2 % (ref 11.6–15.4)
WBC: 5 x10E3/uL (ref 3.4–10.8)

## 2023-12-19 NOTE — Addendum Note (Signed)
 Addended by: ARLYN FANG D on: 12/19/2023 08:17 AM   Modules accepted: Orders

## 2023-12-19 NOTE — Progress Notes (Unsigned)
 SABRA

## 2023-12-21 ENCOUNTER — Ambulatory Visit (HOSPITAL_COMMUNITY)
Admission: RE | Admit: 2023-12-21 | Discharge: 2023-12-21 | Disposition: A | Discharge status: Home / Self Care | Source: Ambulatory Visit | Attending: Hematology and Oncology | Admitting: Hematology and Oncology

## 2023-12-21 DIAGNOSIS — C8590 Non-Hodgkin lymphoma, unspecified, unspecified site: Secondary | ICD-10-CM | POA: Diagnosis not present

## 2023-12-21 DIAGNOSIS — C8280 Other types of follicular lymphoma, unspecified site: Secondary | ICD-10-CM | POA: Diagnosis present

## 2023-12-21 DIAGNOSIS — J439 Emphysema, unspecified: Secondary | ICD-10-CM | POA: Diagnosis not present

## 2023-12-21 DIAGNOSIS — I700 Atherosclerosis of aorta: Secondary | ICD-10-CM | POA: Diagnosis not present

## 2023-12-21 MED ORDER — IOHEXOL 300 MG/ML  SOLN
100.0000 mL | Freq: Once | INTRAMUSCULAR | Status: AC | PRN
  Administered 2023-12-21: 100 mL via INTRAVENOUS

## 2023-12-21 MED ORDER — IOHEXOL 9 MG/ML PO SOLN
ORAL | Status: AC
  Filled 2023-12-21: qty 1000

## 2023-12-21 MED ORDER — IOHEXOL 9 MG/ML PO SOLN
500.0000 mL | ORAL | Status: AC
  Administered 2023-12-21: 500 mL via ORAL

## 2023-12-22 ENCOUNTER — Other Ambulatory Visit: Payer: Self-pay

## 2023-12-22 MED ORDER — LENALIDOMIDE 15 MG PO CAPS: ORAL_CAPSULE | ORAL | 0 refills | Status: DC

## 2023-12-23 ENCOUNTER — Other Ambulatory Visit: Payer: Self-pay

## 2023-12-23 MED ORDER — FINASTERIDE 5 MG PO TABS: 5.0000 mg | ORAL_TABLET | Freq: Every day | ORAL | 1 refills | Status: AC

## 2023-12-26 ENCOUNTER — Inpatient Hospital Stay: Attending: Hematology and Oncology | Admitting: Hematology and Oncology

## 2023-12-26 ENCOUNTER — Encounter: Payer: Self-pay | Admitting: Hematology and Oncology

## 2023-12-26 ENCOUNTER — Telehealth: Payer: Self-pay | Admitting: Family Medicine

## 2023-12-26 DIAGNOSIS — C8280 Other types of follicular lymphoma, unspecified site: Secondary | ICD-10-CM

## 2023-12-26 DIAGNOSIS — D696 Thrombocytopenia, unspecified: Secondary | ICD-10-CM

## 2023-12-26 DIAGNOSIS — Z9221 Personal history of antineoplastic chemotherapy: Secondary | ICD-10-CM | POA: Insufficient documentation

## 2023-12-26 DIAGNOSIS — C828A Other types of follicular lymphoma, in remission: Secondary | ICD-10-CM | POA: Insufficient documentation

## 2023-12-26 NOTE — Telephone Encounter (Signed)
 Copied from CRM #[id number]. Topic: Appointments - Appointment Scheduling >> Dec 26, 2023  3:57 PM Leonette P wrote: Patient/patient representative is calling to schedule an appointment. Refer to attachments for appointment information.   Patient called needing n appt.  His glucose was high when he went to the oncologist last week  He only wants to see Dr. Maryanne and there were no APPTS AMMIE Heinrich .  [phone]

## 2023-12-26 NOTE — Progress Notes (Signed)
 HEMATOLOGY-ONCOLOGY ELECTRONIC VISIT PROGRESS NOTE  Patient Care Team: Dettinger, Fonda LABOR, MD as PCP - General (Family Medicine) Branch, Dorn FALCON, MD as PCP - Cardiology (Cardiology) Mealor, Eulas BRAVO, MD as PCP - Electrophysiology (Cardiology) Avram Lupita BRAVO, MD as Consulting Physician (Gastroenterology) Lonn Hicks, MD as Consulting Physician (Hematology and Oncology) Ivin Kocher, MD as Consulting Physician (Dermatology) Cammie Batters, OD as Referring Physician (Optometry)  I connected with the patient via telephone conference and verified that I am speaking with the correct person using two identifiers. The patient's location is at home and I am providing care from the [facility] I discussed the limitations, risks, security and privacy concerns of performing an evaluation and management service by e-visits and the availability of in person appointments.  I also discussed with the patient that there may be a patient responsible charge related to this service. The patient expressed understanding and agreed to proceed.   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  He had multiple relapses between 2005-20 25, most recently successfully treated with combination of lenalidomide  and rituximab   Rituximab  was discontinued in December 2024 due to poor tolerance with recurrent facial flushing He tolerated single agent maintenance lenalidomide  well without major side effects  I have reviewed recent blood work and imaging study which showed excellent response to treatment with no signs of recurrence He started to have normal imaging studies since June 2024 I recommend minimum 2 years of treatment until June or 2026 before we decide whether the patient will stay on maintenance lenalidomide   indefinitely versus stopping I plan to repeat imaging study in June 2026   Thrombocytopenia This is likely due to recent treatment. The patient denies recent history of bleeding such as epistaxis, hematuria or hematochezia. He is asymptomatic from the low platelet count. I will observe for now.  he does not require transfusion now. I will continue the chemotherapy at current dose without dosage adjustment.   No orders of the defined types were placed in this encounter.   INTERVAL HISTORY: Please see below for problem oriented charting. The purpose of today's discussion is to review test results He tolerated single agent lenalidomide  well No recent side effects I reviewed blood count and CT imaging with the patient and his wife  SUMMARY OF ONCOLOGIC HISTORY: Oncology History Overview Note  Low grade B-cell lymphoma   Primary site: Lymphoid Neoplasms   Staging method: AJCC 6th Edition   Clinical: Stage IV signed by Hicks Lonn, MD on 09/26/2013  9:15 AM   Summary: Stage IV      Follicular low grade B-cell lymphoma (HCC)  12/10/2003 Surgery   Inguinal lymph node biopsy showed follicular lymphoma.   12/12/2003 - 06/01/2004 Chemotherapy   He was treated with R. CHOP chemotherapy which show complete remission. The number of cycles of R. CHOP chemotherapy was unknown.   12/19/2006 Surgery   Lung resection show follicular lymphoma.   01/02/2007 - 09/01/2008 Chemotherapy   The patient was treated with single agent rituximab  alone.   01/19/2007 Bone Marrow Biopsy   Bone marrow biopsy was negative.   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   05/08/2013 - 05/11/2013 Hospital Admission   The patient was admitted to the hospital for management of pericarditis. CT scan showed extensive lymphadenopathy.   06/07/2013 Imaging   PET/CT scan showed extensive  lymphadenopathy   06/25/2013 Procedure   He has placement of Infuse-a-Port.   06/28/2013 Bone  Marrow Biopsy   Bone marrow biopsy is positive for lymphoma involvement.   07/04/2013 - 11/22/2013 Chemotherapy   He is treated with 6 cycles of bendamustine  with rituximab .   09/24/2013 Imaging   Repeat PET scan show complete remission.   12/26/2013 Imaging   PEt scan showed complete remission   09/26/2014 Imaging   CT scan of the chest abdomen and pelvis show no evidence of disease   03/26/2015 Imaging   CT scan showed no evidence of lymphoma   04/14/2016 Imaging   CT: Borderline prominent right hilar and subcarinal lymph nodes, but not appreciably changed. 2. Low-grade but increased central mesenteric stranding with some small mesenteric lymph nodes. This could certainly be inflammatory, 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, much of which is likely a physiologic response to cholecystectomy. 7. Sigmoid colon diverticulosis. 8.  Prominent stool throughout the colon favors constipation. 9. Enlarged prostate gland, volume estimated at 75 cubic cm.   02/15/2017 Imaging   No evidence of recurrent lymphoma or other acute findings.  Stable moderate hiatal hernia.  Colonic diverticulosis, without radiographic evidence of diverticulitis.  Stable mildly enlarged prostate.  Mild emphysema.  Aortic and coronary artery atherosclerosis.   01/31/2019 Imaging   1. Interval development of abdominal and pelvic adenopathy compatible with recurrent lymphoma. 2. Emphysema and aortic atherosclerosis. 3. Multi vessel coronary artery calcifications. 4. Moderate to large hiatal hernia   02/09/2019 Procedure   Successful CT-guided core biopsy of the left retroperitoneal periaortic adenopathy    02/09/2019 Pathology Results   SURGICAL PATHOLOGY  CASE: [id number]  PATIENT: DEBBY COLLUM  Surgical Pathology Report   Clinical History: Lymphoma; [name], LEFT PARA AORTIC, [name], cleaved nuclei and scant cytoplasm.  By immunohistochemistry, [name], [name], [name], [name] is low (less than 10%).  Flow cytometry was attempted; however, there was insufficient material for analysis (see [id number]).  Overall, the features are consistent with relapse of the patient's previously diagnosed follicular lymphoma.  Based on the biopsy, this is favored to be a low-grade follicular lymphoma   02/19/2019 -  Chemotherapy   The patient had idelisib for chemotherapy treatment.     05/17/2019 Imaging   1. Interval generalized decrease in abdominopelvic lymphadenopathy identified on the previous study. No new or progressive lymphadenopathy on today's study. 2. Moderate hiatal hernia. 3.  Emphysema (ICD10-J43.9) and Aortic Atherosclerosis (ICD10-170.0)   11/16/2019 Imaging   IMPRESSION: Chest Impression:   1. No mediastinal lymphadenopathy. 2. Post RIGHT lung wedge resection without evidence local recurrence.   Abdomen / Pelvis Impression:   1. Stable numerous small periaortic retroperitoneal nodes and common iliac nodes. No  progression of adenopathy. 2. No splenomegaly.  No skeletal metastasis.     07/31/2020 Imaging   1. Stable examination. No significant  interval change in the prominent/mildly enlarged lymph nodes below the diaphragm. No thoracic adenopathy. No splenomegaly. 2. Postsurgical change of right lung wedge resection without evidence of local recurrence. 3. Large hiatal hernia. 4. Colonic diverticulosis without findings of acute diverticulitis. 5. Aortic Atherosclerosis (ICD10-I70.0) and Emphysema (ICD10-J43.9).     01/29/2021 Imaging   1. Overall mild interval enlargement of lymph nodes in the abdomen and pelvis as described. 2. No lymphadenopathy identified in the chest. 3. Emphysematous changes of the lungs. 4. Large hiatal hernia.  Extensive colonic diverticulosis. 5. Other ancillary findings as described.     04/30/2021 Imaging   Chest Impression:   1. No mediastinal lymphadenopathy. 2. Large hiatal hernia. 3. Retrocrural node slightly decreased in size.   Abdomen / Pelvis Impression:   1. Cluster of numerous small retroperitoneal periaortic lymph nodes. Lymph nodes are decreased slightly in size compared to recent CT scan. 2. Likewise reduction in LEFT external iliac small lymph node. 3. No evidence of progression of lymphoma. 4. Normal spleen. 5. No bone involvement.   10/28/2021 Imaging   1. Overall trend towards progressive disease on today's exam. Lymph nodes in the hepatoduodenal ligament are upper normal for size but stable in the interval. Index retroperitoneal lymph nodes are progressive, notably in the right common iliac chain. Soft tissue nodule/lymph node in the retroperitoneal fat adjacent to the right iliacus muscle is progressive. 2. Large hiatal hernia. 3. Colonic diverticulosis without diverticulitis. 4. Prostatomegaly. 5. Aortic Atherosclerosis (ICD10-I70.0) and Emphysema (ICD10-J43.9).   03/10/2022 PET scan   Multiple intensely avid and enlarged retroperitoneal lymph nodes consistent with patient's known history of lymphoma. Of note, some of these lymph nodes demonstrate interval growth when  compared to most recent abdominal CT, for reference to the node adjacent to the right psoas musculature. For purposes of assessing transformation into a more aggressive form of lymphoma, SUV max calculated using total body weight is 10.5 for the largest right iliac node and 9.8 for the soft tissue implant posterior to the right psoas muscle.  - Similar focus of increased hypermetabolic to be within the right hilar region, which may represent mediastinal involvement versus reactive etiology. Attention on follow-up examinations.    03/24/2022 Procedure   PROCEDURE: The patient was placed in the prone position. Initial CT images of the pelvis were obtained, demonstrating enlarged pelvic lymph node.   An appropriate entry site was identified and marked on the skin. The right back was prepped and draped using all elements of maximal sterile barrier technique.  Local anesthesia was achieved with lidocaine .   Under intermittent CT guidance, a 18-G coaxial needle was inserted into right pelvic lymph node.  A total of 3 cores were obtained through the cannula using spring-loaded 18-G Corvocet biopsy device.    03/24/2022 Pathology Results   A: Lymph node, pelvis, right, biopsy -  Involved by follicular lymphoma (see Comment)   Special Stains: CD5, [name] and CD20 are performed by immunohistochemistry and/or in situ hybridization in addition to flow cytometry for histopathologic and immunophenotypic correlation.   Appropriate controls for each stain have been evaluated and stain as expected.   Sections of block A1 are stained.   CD3: CD3 stain is negative in neoplastic lymphocytes. CD20: CD20 stains neoplastic lymphocytes. CD21: CD21 stains follicular dendritic cells of enlarge neoplastic follicles. CD5: CD5 mirrors CD3 [name]: [name] stains weakly neoplastic lymphocytes.  Bcl-6: Bcl-6 stains neoplastic lymphocytes. Bcl-2: Bcl-2 stains neoplastic lymphocytes.  Cyclin-D1: Cyclin-D1 stains is negative in  neoplastic lymphocytes. Ki-67: Ki-67 shows low proliferation rate in neoplastic lymphocytes (10%). GMS: GMS stain is negative for microorganisms AFB: AFB stain is negative for microorganisms   04/08/2022 - 08/26/2022 Chemotherapy   Revlimid  was started on 4/4 Patient is on Treatment Plan : LYMPHOMA Lenalidomide  + Rituximab  q28d      07/01/2022 Imaging   CT CHEST ABDOMEN PELVIS W CONTRAST  Result Date: 07/01/2022 CLINICAL DATA:  Hematologic malignancy, assess treatment response follicular B-cell lymphoma, ongoing chemotherapy * Tracking Code: BO * EXAM: CT CHEST, ABDOMEN, AND PELVIS WITH CONTRAST TECHNIQUE: Multidetector CT imaging of the chest, abdomen and pelvis was performed following the standard protocol during bolus administration of intravenous contrast. RADIATION DOSE REDUCTION: This exam was performed according to the departmental dose-optimization program which includes automated exposure control, adjustment of the mA and/or kV according to patient size and/or use of iterative reconstruction technique. CONTRAST:  OMNIPAQUE  IOHEXOL  300 MG/ML  SOLN COMPARISON:  PET-CT, 03/10/2022 FINDINGS: CT CHEST FINDINGS Cardiovascular: Aortic atherosclerosis. Normal heart size. Three-vessel coronary artery calcifications. No pericardial effusion. Mediastinum/Nodes: No enlarged mediastinal, hilar, or axillary lymph nodes. Moderate hiatal hernia with intrathoracic position of the gastric fundus. Unchanged prominent lymph nodes at the right aspect of the hiatal hernia, not previously FDG avid. Thyroid  gland, trachea, and esophagus demonstrate no significant findings. Lungs/Pleura: Moderate centrilobular and paraseptal emphysema. Unchanged postoperative findings of right lower lobe wedge resection. No pleural effusion or pneumothorax. Musculoskeletal: No chest wall abnormality. No acute osseous findings. CT ABDOMEN PELVIS FINDINGS Hepatobiliary: No focal liver abnormality is seen. Status post cholecystectomy.  Unchanged postoperative biliary ductal dilatation. Pancreas: Unremarkable. No pancreatic ductal dilatation or surrounding inflammatory changes. Spleen: Normal in size without significant abnormality. Adrenals/Urinary Tract: Adrenal glands are unremarkable. Simple, benign left renal cortical cysts, for which no further follow-up or characterization is required. Kidneys are otherwise normal, without renal calculi, solid lesion, or hydronephrosis. Bladder is unremarkable. Stomach/Bowel: Stomach is within normal limits. Appendix not clearly visualized. No evidence of bowel wall thickening, distention, or inflammatory changes. Descending and sigmoid diverticulosis Vascular/Lymphatic: Aortic atherosclerosis. Interval decrease in size of previously FDG avid retroperitoneal lymph nodes, largest right common iliac node measuring 0.9 x 0.8 cm (series 2, image 73). Unchanged prominent retroperitoneal lymph nodes, not previously FDG avid (series 2, image 63). Reproductive: Prostatomegaly with median lobe hypertrophy. Other: Small fat containing right inguinal hernia. No ascites. Nearly complete resolution of a retroperitoneal soft tissue nodule overlying the right iliac crest measuring 0.7 x 0.5 cm (series 2, image 87). Musculoskeletal: No acute osseous findings. IMPRESSION: 1. Interval decrease in size of previously FDG avid retroperitoneal lymph nodes, as well as a soft tissue nodule overlying the right iliac crest. 2. Additional prominent lymph nodes adjacent to the hiatal hernia and in the retroperitoneum are unchanged, not previously FDG avid. 3. Findings are consistent with treatment response of lymphoma. 4. Emphysema. 5. Coronary artery disease. 6. Prostatomegaly. Aortic Atherosclerosis (ICD10-I70.0) and Emphysema (ICD10-J43.9). Electronically Signed   By: Marolyn JONETTA Jaksch M.D.   On: 07/01/2022 08:39      10/21/2022 - 12/31/2022 Chemotherapy   Patient is on Treatment Plan : NON-HODGKINS LYMPHOMA Rituximab  q60d  Maintenance     12/14/2022 Imaging   CT CHEST ABDOMEN PELVIS W CONTRAST  Result Date: 12/14/2022 CLINICAL DATA:  History of follicular B-cell lymphoma, assess treatment response. * Tracking Code: BO * EXAM: CT CHEST, ABDOMEN, AND PELVIS WITH CONTRAST TECHNIQUE:  Multidetector CT imaging of the chest, abdomen and pelvis was performed following the standard protocol during bolus administration of intravenous contrast. RADIATION DOSE REDUCTION: This exam was performed according to the departmental dose-optimization program which includes automated exposure control, adjustment of the mA and/or kV according to patient size and/or use of iterative reconstruction technique. CONTRAST:  OMNIPAQUE  IOHEXOL  300 MG/ML  SOLN COMPARISON:  Multiple priors including most recent CT June 29, 2022 FINDINGS: CT CHEST FINDINGS Cardiovascular: Scattered aortic atherosclerosis. No central pulmonary embolus on this nondedicated study. Normal size heart. Three-vessel coronary artery calcifications. Mediastinum/Nodes: No suspicious thyroid  nodule. Moderate-sized hiatal hernia No pathologically enlarged mediastinal, hilar or axillary lymph nodes. Unchanged prominent lymph nodes in the right side of the hiatal hernia for instance on image 48/2 not previously FDG avid. Lungs/Pleura: Moderate centrilobular and paraseptal emphysema. Similar postoperative findings from right lower lobe wedge resection. Scattered atelectasis/scarring. No suspicious pulmonary nodules or masses. Musculoskeletal: No aggressive lytic or blastic lesion of bone. 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 CT ABDOMEN PELVIS FINDINGS Hepatobiliary: Fluid signal  hypodensity in the left lobe of the liver measuring 13 mm on image 53/2 is stable from prior and compatible with a benign cyst. No solid enhancing hepatic lesion. Gallbladder is unremarkable. Prominence of the biliary tree is similar over multiple prior examinations and compatible with reservoir effect post cholecystectomy. Pancreas: No pancreatic ductal dilation or evidence of acute inflammation. Spleen: No splenomegaly. Adrenals/Urinary Tract: Bilateral adrenal glands appear normal. No hydronephrosis. Stable left renal cysts measuring up to 4.6 cm. Kidneys demonstrate symmetric enhancement. Urinary bladder is unremarkable for degree of distension. Stomach/Bowel: Radiopaque enteric contrast material traverses the descending colon. Moderate-sized hiatal hernia. No pathologic dilation of small or large bowel. Colonic diverticulosis without findings of acute diverticulitis. Vascular/Lymphatic: Aortic atherosclerosis. Smooth IVC contours. The portal, splenic and superior mesenteric veins are patent. Unchanged size of the prominent retroperitoneal lymph nodes. For reference: -left periaortic lymph node at the level of the renal vein measures 5 mm on image 64/2, unchanged. -right common iliac lymph node measures 4 mm in short axis on image 89/2, unchanged. No new pathologically enlarged or enlarging lymph nodes in the abdomen or pelvis identified. 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 Other: No significant abdominopelvic free fluid. Musculoskeletal: Stable small soft tissue nodule versus lymph node in the retroperitoneal soft tissues overlying the right iliac crest on image 89/2. No aggressive lytic or blastic lesion of bone. Multilevel degenerative change of the spine. IMPRESSION: 1. Stable examination without evidence of new or progressive disease in the chest, abdomen or pelvis. 2. Aortic Atherosclerosis (ICD10-I70.0) and Emphysema (ICD10-J43.9). Electronically Signed   By: Reyes Holder M.D.   On:  12/14/2022 13:43      06/13/2023 Imaging   CT CHEST ABDOMEN PELVIS W CONTRAST Result Date: 06/13/2023 CLINICAL DATA:  Follicular lymphoma, rule out recurrence * Tracking Code: BO * EXAM: CT CHEST, ABDOMEN, AND PELVIS WITH CONTRAST TECHNIQUE: Multidetector CT imaging of the chest, abdomen and pelvis was performed following the standard protocol during bolus administration of intravenous contrast. RADIATION DOSE REDUCTION: This exam was performed according to the departmental dose-optimization program which includes automated exposure control, adjustment of the mA and/or kV according to patient size and/or use of iterative reconstruction technique. CONTRAST:  OMNIPAQUE  IOHEXOL  300 MG/ML SOLN additional oral enteric contrast COMPARISON:  12/14/2022 FINDINGS: CT CHEST FINDINGS Cardiovascular: No significant vascular findings. Normal heart size. No pericardial effusion. Mediastinum/Nodes: No enlarged mediastinal, hilar, or axillary lymph nodes. Moderate hiatal hernia with intrathoracic position of the gastric fundus. Thyroid  gland, trachea, and esophagus demonstrate no significant findings. Lungs/Pleura: Mild centrilobular and paraseptal emphysema. Unchanged postoperative findings of wedge resection involving the medial right lower lobe. No pleural effusion or pneumothorax. Musculoskeletal: No chest wall abnormality. No acute osseous findings. CT ABDOMEN PELVIS FINDINGS Hepatobiliary: No focal liver abnormality is seen. Status post cholecystectomy. Unchanged postoperative biliary ductal dilatation. Pancreas: Unremarkable. No pancreatic ductal dilatation or surrounding inflammatory changes. Spleen: Normal in size without significant abnormality. Adrenals/Urinary Tract: Adrenal glands are unremarkable. Benign left renal cortical cysts, for which no further follow-up or characterization is required. Kidneys are otherwise normal, without renal calculi, solid lesion, or hydronephrosis. Bladder is unremarkable.  Stomach/Bowel: Stomach is within normal limits. Appendix appears normal. No evidence of bowel wall thickening, distention, or inflammatory changes. Descending and sigmoid diverticulosis. Vascular/Lymphatic: Aortic atherosclerosis. No persistently enlarged lymph nodes. Unchanged, treated subcentimeter retroperitoneal lymph nodes (series 2, image 67). Reproductive: Probable TURP defect of the prostate. Other: No abdominal wall hernia or abnormality. No ascites. Musculoskeletal: No acute osseous findings. IMPRESSION: 1. No persistently enlarged lymph nodes of the chest, abdomen, or pelvis. Unchanged, treated subcentimeter retroperitoneal lymph nodes. 2. Unchanged postoperative findings of wedge resection involving the medial right lower lobe. 3. Emphysema. 4. Descending and sigmoid diverticulosis without evidence of acute diverticulitis. Aortic Atherosclerosis (ICD10-I70.0) and Emphysema (ICD10-J43.9). Electronically Signed   By: Marolyn JONETTA Jaksch M.D.   On: 06/13/2023 13:04      12/21/2023 Imaging   CT CHEST ABDOMEN PELVIS W CONTRAST Result Date: 12/21/2023 CLINICAL DATA:  recurrent lymphoma rule out recurrence. * Tracking Code: BO * EXAM: CT CHEST, ABDOMEN, AND PELVIS WITH CONTRAST TECHNIQUE: Multidetector CT imaging of the chest, abdomen and pelvis was performed following the standard protocol during bolus administration of intravenous contrast. RADIATION DOSE REDUCTION: This exam was performed according to the departmental dose-optimization program which includes automated exposure control, adjustment of the mA and/or kV according to patient size and/or use of iterative reconstruction technique. CONTRAST:  OMNIPAQUE  IOHEXOL  300 MG/ML  SOLN COMPARISON:  CT scan chest, abdomen and pelvis from 06/13/2023. FINDINGS: CT CHEST FINDINGS Cardiovascular: Normal cardiac size. No pericardial effusion. No aortic aneurysm. There are coronary artery calcifications, in keeping with coronary artery disease. There are also  mild peripheral atherosclerotic vascular calcifications of thoracic aorta and its major branches. Mediastinum/Nodes: Visualized thyroid  gland appears grossly unremarkable. No solid / cystic mediastinal masses. The esophagus is nondistended precluding optimal assessment. No axillary, mediastinal or hilar lymphadenopathy by size criteria. Lungs/Pleura: The central tracheo-bronchial tree is patent, noting small mucous/secretion along the right lower tracheal wall. Mild upper lobe predominant emphysematous changes noted. Postsurgical changes noted in the right lower lobe. There are patchy areas of linear, plate-like atelectasis and/or scarring throughout bilateral lungs. No mass or consolidation. No pleural effusion or pneumothorax. No suspicious lung nodules. Musculoskeletal: The visualized soft tissues of the chest wall are grossly unremarkable. No suspicious osseous lesions. There are mild multilevel degenerative changes in the visualized spine. CT ABDOMEN PELVIS FINDINGS Hepatobiliary: The liver is normal in size. Non-cirrhotic configuration. These is diffuse hepatic steatosis. No suspicious mass. Note is made of stable simple cyst measuring 1.0 x 1.3 cm in the left hepatic lobe, segment 2. No intrahepatic bile duct dilation. There is mild  prominence of the extrahepatic bile duct, most likely due to post cholecystectomy status. Gallbladder is surgically absent. Pancreas: Unremarkable. No pancreatic ductal dilatation or surrounding inflammatory changes. Spleen: Within normal limits. No focal lesion. Adrenals/Urinary Tract: Adrenal glands are unremarkable. No suspicious renal mass. Redemonstration of several simple cysts mainly in the left kidney with largest arising from the lower pole, posterolaterally measuring up to 4.1 x 4.5 cm. No hydronephrosis. No renal or ureteric calculi. Unremarkable urinary bladder. Stomach/Bowel: No disproportionate dilation of the small or large bowel loops. No evidence of abnormal bowel  wall thickening or inflammatory changes. The appendix was not visualized; however there is no acute inflammatory process in the right lower quadrant. There are multiple diverticula mainly in the left hemi colon, without imaging signs of diverticulitis. Vascular/Lymphatic: No ascites or pneumoperitoneum. Redemonstration of few stable prominent retroperitoneal lymph nodes, which are essentially unchanged since the prior study. No new abdominal or pelvic lymphadenopathy, by size criteria. No aneurysmal dilation of the major abdominal arteries. There are moderate peripheral atherosclerotic vascular calcifications of the aorta and its major branches. Reproductive: Normal size prostate. Symmetric seminal vesicles. Other: There are small fat containing umbilical and right inguinal hernias. The soft tissues and abdominal wall are otherwise unremarkable. Musculoskeletal: No suspicious osseous lesions. There are mild - moderate multilevel degenerative changes in the visualized spine. IMPRESSION: 1. No lymphadenopathy by size criteria within the chest, abdomen or pelvis. 2. Multiple other nonacute observations, as described above. Aortic Atherosclerosis (ICD10-I70.0) and Emphysema (ICD10-J43.9). Electronically Signed   By: Ree Molt M.D.   On: 12/21/2023 13:33       I discussed the assessment and treatment plan with the patient. The patient was provided an opportunity to ask questions and all were answered. The patient agreed with the plan and demonstrated an understanding of the instructions. The patient was advised to call back or seek an in-person evaluation if the symptoms worsen or if the condition fails to improve as anticipated.    I spent 20 minutes for the appointment reviewing test results, discuss management and coordination of care.  Almarie Bedford, MD 12/26/2023 2:50 PM

## 2023-12-26 NOTE — Telephone Encounter (Signed)
 Spoke to patients wife per signed DPR. Appointment made with Dr. Maryanne. 01/13/2024 to get labwork.

## 2023-12-26 NOTE — Assessment & Plan Note (Signed)
 The patient was originally diagnosed with follicular lymphoma in 2004, subsequently transformed into high-grade pathology and was treated successfully with chemotherapy.  He had multiple relapses between 2005-20 25, most recently successfully treated with combination of lenalidomide  and rituximab   Rituximab  was discontinued in December 2024 due to poor tolerance with recurrent facial flushing He tolerated single agent maintenance lenalidomide  well without major side effects  I have reviewed recent blood work and imaging study which showed excellent response to treatment with no signs of recurrence He started to have normal imaging studies since June 2024 I recommend minimum 2 years of treatment until June or 2026 before we decide whether the patient will stay on maintenance lenalidomide  indefinitely versus stopping I plan to repeat imaging study in June 2026

## 2023-12-26 NOTE — Assessment & Plan Note (Signed)
This is likely due to recent treatment. The patient denies recent history of bleeding such as epistaxis, hematuria or hematochezia. He is asymptomatic from the low platelet count. I will observe for now.  he does not require transfusion now. I will continue the chemotherapy at current dose without dosage adjustment.    

## 2023-12-27 ENCOUNTER — Encounter: Payer: Self-pay | Admitting: Hematology and Oncology

## 2023-12-27 ENCOUNTER — Ambulatory Visit: Payer: Medicare Other | Admitting: Urology

## 2024-01-13 ENCOUNTER — Encounter: Payer: Self-pay | Admitting: Family Medicine

## 2024-01-13 ENCOUNTER — Ambulatory Visit: Admitting: Family Medicine

## 2024-01-13 VITALS — BP 133/77 | HR 63 | Ht 72.0 in | Wt 186.0 lb

## 2024-01-13 DIAGNOSIS — I480 Paroxysmal atrial fibrillation: Secondary | ICD-10-CM

## 2024-01-13 DIAGNOSIS — E1169 Type 2 diabetes mellitus with other specified complication: Secondary | ICD-10-CM | POA: Diagnosis not present

## 2024-01-13 DIAGNOSIS — Z7984 Long term (current) use of oral hypoglycemic drugs: Secondary | ICD-10-CM

## 2024-01-13 DIAGNOSIS — C8280 Other types of follicular lymphoma, unspecified site: Secondary | ICD-10-CM | POA: Diagnosis not present

## 2024-01-13 DIAGNOSIS — R7303 Prediabetes: Secondary | ICD-10-CM

## 2024-01-13 DIAGNOSIS — R739 Hyperglycemia, unspecified: Secondary | ICD-10-CM

## 2024-01-13 LAB — BAYER DCA HB A1C WAIVED: HB A1C (BAYER DCA - WAIVED): 6.4 % — ABNORMAL HIGH (ref 4.8–5.6)

## 2024-01-13 MED ORDER — METFORMIN HCL 500 MG PO TABS: 500.0000 mg | ORAL_TABLET | Freq: Two times a day (BID) | ORAL | 3 refills | Status: AC

## 2024-01-13 NOTE — Progress Notes (Signed)
 "  BP 133/77   Pulse 63   Ht 6' (1.829 m)   Wt 186 lb (84.4 kg)   SpO2 96%   BMI 25.23 kg/m    Subjective:   Patient ID: Anthony Kelly, male    DOB: 03-28-1945, 79 y.o.   MRN: [id number]  HPI: Anthony Kelly is a 79 y.o. male presenting on 01/13/2024 for Hyperglycemia ([facility] wants pt to have A1C checked)   Discussed the use of AI scribe software for clinical note transcription with the patient, who gave verbal consent to proceed.  History of Present Illness   Anthony Kelly is a 79 year old male who presents for management of elevated blood sugars.  Hyperglycemia - Elevated blood glucose levels over the past couple of years - Recent home readings: 156 mg/dL this morning, 865 mg/dL yesterday - Blood glucose measured at 240 mg/dL on December 8th after consuming food and orange juice  Malignancy remission and cancer therapy - Currently in remission from cancer - Taking Revlimid  as the only cancer medication - Has opted not to receive infusions - Scheduled for a scan in six months to determine if Revlimid  can be discontinued  Atrial fibrillation - History of atrial fibrillation - Heart rhythm is generally controlled - Occasional irregular heart rhythm          Relevant past medical, surgical, family and social history reviewed and updated as indicated. Interim medical history since our last visit reviewed. Allergies and medications reviewed and updated.  Review of Systems  Constitutional:  Negative for chills and fever.  Eyes:  Negative for visual disturbance.  Respiratory:  Negative for shortness of breath and wheezing.   Cardiovascular:  Negative for chest pain and leg swelling.  Skin:  Negative for rash.  Neurological:  Negative for dizziness and light-headedness.  All other systems reviewed and are negative.   Per HPI unless specifically indicated above   Allergies as of 01/13/2024       Reactions   Rituximab  Other (See Comments)   Throat tightness and  facial flushing. Patient had hypersensitivty reaction to rapid rituximab . See progress note dated 10/21/2023        Medication List        Accurate as of January 13, 2024  9:32 AM. If you have any questions, ask your nurse or doctor.          acetaminophen  500 MG tablet Commonly known as: TYLENOL  Take 500-1,000 mg by mouth as needed for moderate pain (pain score 4-6).   acyclovir  400 MG tablet Commonly known as: ZOVIRAX  TAKE 1 TABLET(400 MG) BY MOUTH DAILY   BEET ROOT PO Take 1 Dose by mouth daily.   brimonidine  0.2 % ophthalmic solution Commonly known as: ALPHAGAN  Place 1 drop into both eyes 2 (two) times daily.   Calcium  1200 1200-1000 MG-UNIT Chew Chew 1 tablet by mouth daily.   cyclobenzaprine  5 MG tablet Commonly known as: FLEXERIL  Take 1 tablet (5 mg total) by mouth 3 (three) times daily as needed for muscle spasms.   Eliquis  5 MG Tabs tablet Generic drug: apixaban  TAKE 1 TABLET TWICE A DAY   finasteride  5 MG tablet Commonly known as: PROSCAR  Take 1 tablet (5 mg total) by mouth daily.   flecainide  50 MG tablet Commonly known as: TAMBOCOR  Take 1 tablet (50 mg total) by mouth 2 (two) times daily.   fluorouracil 5 % cream Commonly known as: EFUDEX Apply topically as needed.   lenalidomide  15 MG capsule Commonly known as: Revlimid   TAKE 1 CAPSULE DAILY FOR 21 DAYS ON, THEN 7 DAYS OFF. REPEAT EVERY 28 DAYS   Lumigan 0.01 % Soln Generic drug: bimatoprost Place 1 drop into both eyes at bedtime.   meclizine  25 MG tablet Commonly known as: ANTIVERT  Take 1 tablet (25 mg total) by mouth as needed for dizziness.   metFORMIN 500 MG tablet Commonly known as: GLUCOPHAGE Take 1 tablet (500 mg total) by mouth 2 (two) times daily with a meal. Started by: Fonda Levins, MD   metoprolol  succinate 25 MG 24 hr tablet Commonly known as: TOPROL -XL Take 0.5 tablets (12.5 mg total) by mouth 2 (two) times daily. (May take an additional 1/2 tab as needed for  palpitations   mometasone 0.1 % cream Commonly known as: ELOCON Apply 1 Application topically as needed.   multivitamin with minerals Tabs tablet Take 1 tablet by mouth daily.   omeprazole  20 MG tablet Commonly known as: PRILOSEC  OTC Take 20 mg by mouth every other day.   ondansetron  8 MG tablet Commonly known as: ZOFRAN  Take 8 mg by mouth as needed for nausea or vomiting.   prochlorperazine  10 MG tablet Commonly known as: COMPAZINE  Take 10 mg by mouth as needed for nausea or vomiting.   rosuvastatin  20 MG tablet Commonly known as: CRESTOR  TAKE 1 TABLET(20 MG) BY MOUTH DAILY   SYSTANE OP Place 1 drop into both eyes daily as needed (dry eyes).   traMADol  50 MG tablet Commonly known as: Ultram  Take 2 tablets (100 mg total) by mouth every 12 (twelve) hours as needed.   vitamin C 1000 MG tablet Take 1,000 mg by mouth daily.         Objective:   BP 133/77   Pulse 63   Ht 6' (1.829 m)   Wt 186 lb (84.4 kg)   SpO2 96%   BMI 25.23 kg/m   Wt Readings from Last 3 Encounters:  01/13/24 186 lb (84.4 kg)  09/29/23 184 lb (83.5 kg)  09/21/23 184 lb 9.6 oz (83.7 kg)    Physical Exam Physical Exam   NECK: Thyroid  without palpable masses. CHEST: Lungs clear to auscultation bilaterally. CARDIOVASCULAR: Heart in atrial fibrillation, rate controlled.         Assessment & Plan:   Problem List Items Addressed This Visit       Cardiovascular and Mediastinum   PAF (paroxysmal atrial fibrillation) (HCC) (Chronic)     Endocrine   Type 2 diabetes mellitus with other specified complication (HCC) - Primary   Relevant Medications   metFORMIN (GLUCOPHAGE) 500 MG tablet     Other   Follicular low grade B-cell lymphoma (HCC)   Other Visit Diagnoses       Blood glucose elevated       Relevant Medications   metFORMIN (GLUCOPHAGE) 500 MG tablet   Other Relevant Orders   Bayer DCA Hb A1c Waived          Type 2 diabetes mellitus Elevated blood glucose, A1c 6.4%.  Metformin chosen for cost-effectiveness and compatibility with Revlimid . - Started metformin 500 mg twice daily with breakfast and dinner.  Follicular lymphoma in remission Lymphoma in remission, on Revlimid  without infusion therapy. Reassessment planned in six months. - Continue Revlimid  therapy. - Schedule follow-up scan in six months to assess lymphoma status.  Paroxysmal atrial fibrillation Rate-controlled, heart rate regular.     Patient sees cardiology A-fib sees oncology for follicular B cell, and is managed well by them     Follow up plan: Return  in about 3 months (around 04/12/2024), or if symptoms worsen or fail to improve, for Diabetes recheck.  Counseling provided for all of the vaccine components Orders Placed This Encounter  Procedures   Bayer DCA Hb A1c Waived    Fonda Levins, MD [facility] 01/13/2024, 9:32 AM     "

## 2024-01-19 ENCOUNTER — Telehealth: Payer: Self-pay

## 2024-01-19 ENCOUNTER — Other Ambulatory Visit: Payer: Self-pay

## 2024-01-19 MED ORDER — LENALIDOMIDE 15 MG PO CAPS: ORAL_CAPSULE | ORAL | 0 refills | Status: DC

## 2024-01-19 NOTE — Telephone Encounter (Signed)
 Called and scheduled appt on 3/17, lab and Dr. Lonn. She is aware of appt for husband.

## 2024-01-29 NOTE — Progress Notes (Unsigned)
 "  [facility]  PATIENT: Anthony Kelly DOB: [birth year]  REFERRING DOCTOR OR PCP: Fonda Dettinger MD SOURCE: Patient, note from neurology, imaging and lab reports, MRI images personally reviewed.  _________________________________   HISTORICAL  CHIEF COMPLAINT:  No chief complaint on file.   HISTORY OF PRESENT ILLNESS:  ***   Imaging: MRI of the head 07/24/2020 showed minimal generalized cortical atrophy, typical for age and minimal chronic microvascular ischemic changes, typical for age.  There was a very mild right mastoid effusion.  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  MR angiogram of the intracranial arteries 07/24/2020 showed no large vessel occlusion or severe stenosis.  Some irregularity is noted in the M2 segments of the middle cerebral arteries.  Mastoid effusions were noted.  Note from 12/27/2019: He has had episdoes of vertigo associated with nausea/vomiting.   His forst episode was in October and lasted 9 days.   The vertigo (back/forth not spinning) was constant but would worsen with his head looking up (putting in eye drops) or with any movements.   He was vomiting all day the first day and off.on the rest of the time.    He was completely back to normal by early November.   The symptoms returned early December and are just now back to normal.   This second episode was similar but with less nausea nd vomiting.   He noted with both episodes walking into the furniture, walls and door frames -- usually to the right. He did not drive during the episodes and tried to just sit still.  He did not feel nystagmus and his wife did not note that he had any with either episode.    He did not note any change in his hearing or a stuffed up sensation.       He feels he is completely back to his baseline.    He has not seen ENT.   He has not had any imaging studies.     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  Plan  I most concerned about the possibility of Mnire's disease.  Therefore, I would like him to start Dyazide.  Because BP is low normal, if he gets dizzy we may need to stop and consider HCTZ monotherapy instead of the combination of Dyazide.  Additionally, he has numbness in his feet consistent with a mild polyneuropathy.  I will also check some blood work for B12 deficiency and SPEP/IEF.   If the Dyazide does not control his symptoms we would need to check an MRI of the brain to make sure that there is not intracranial process.  REVIEW OF SYSTEMS: Constitutional: No fevers, chills, sweats, or change in appetite Eyes: No visual changes, double vision, eye pain Ear, nose and throat: No hearing loss, ear pain, nasal congestion, sore throat Cardiovascular: No chest pain, palpitations Respiratory:  No shortness of breath at rest or with exertion.   No wheezes GastrointestinaI: No nausea, vomiting, diarrhea, abdominal pain, fecal incontinence Genitourinary:  No dysuria, urinary retention or frequency.  No nocturia. Musculoskeletal:  No neck pain, back pain Integumentary: No rash, pruritus, skin lesions Neurological: as above Psychiatric: No depression at this time.  No anxiety Endocrine: No palpitations, diaphoresis, change in appetite, change in weigh or increased thirst Hematologic/Lymphatic:  No anemia, purpura, petechiae. 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 04/2013 -adm with CP, elevated CRP. H/o coronary artery calcification on prior CT but nuc was negative, EF 71%.   Arthritis    Atrial fibrillation (HCC)    on Metoprolol  and Eliquis  for this  a.  Isolated episode in the setting of acute pericarditis 04/2013. Was not placed on anticoag.   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   Metastasis to lung (HCC) dx'd 01/2007   Metastasis to lymph nodes (HCC) dx'd 03/2011   lt submandibular ln   Pain in joint, pelvic region and thigh 08/01/2013   PSVT (paroxysmal supraventricular tachycardia)    SCC (squamous cell carcinoma)    Left cheek    PAST SURGICAL HISTORY: Past Surgical History:  Procedure Laterality Date   ATRIAL FIBRILLATION ABLATION N/A 02/17/2022   Procedure: ATRIAL FIBRILLATION ABLATION;  Surgeon: Nancey Eulas BRAVO, MD;  Location: [facility] INVASIVE CV [facility];  Service: Cardiovascular;  Laterality: N/A;   CHOLECYSTECTOMY     CYSTOSCOPY N/A 09/09/2022   Procedure: CYSTOSCOPY;  Surgeon: Sherrilee Belvie CROME, MD;  Location: [facility];  Service: Urology;  Laterality: N/A;   EXPLORATORY LAPAROTOMY     EYE SURGERY Right 2016   cataract   LEFT HEART CATH AND CORONARY ANGIOGRAPHY N/A 06/05/2019   Procedure: LEFT HEART CATH AND CORONARY ANGIOGRAPHY;  Surgeon: Dann Candyce RAMAN, MD;  Location: [facility] INVASIVE CV LAB;  Service: Cardiovascular;  Laterality: N/A;   LUNG LOBECTOMY     right side   LYMPH NODE BIOPSY     in groin with removal   MENISCUS REPAIR     right knee   MOHS SURGERY Left 09/2020   cheek   PROSTATE SURGERY     REFRACTIVE SURGERY Right    piece of metal removed   SUBMANDIBULAR GLAND EXCISION  04/2011    SUBMANDIBULAR GLAND EXCISION  04/30/2011   Procedure: EXCISION SUBMANDIBULAR GLAND;  Surgeon: Alm Bouche, MD;  Location: [facility] OR;  Service: ENT;  Laterality: Left;  WITH DIAGNOSTIC BIOPSY   TONSILLECTOMY     as a child   TRANSURETHRAL RESECTION OF PROSTATE N/A 09/09/2022   Procedure: TRANSURETHRAL RESECTION OF THE PROSTATE (TURP);  Surgeon: Sherrilee Belvie CROME, MD;  Location: [facility];  Service: Urology;  Laterality: N/A;   VEIN LIGATION AND STRIPPING     right leg    FAMILY HISTORY: Family History  Problem Relation Age of Onset   Heart disease Father    Heart attack Father        x 3   Cancer Father    Cancer Sister        breast ca   Heart attack Sister    Cancer Sister        breast   Alzheimer's disease Sister  Diabetes Sister    Cancer Brother        prostate ca   Heart disease Brother    Heart disease Brother    Diabetes Brother    Heart disease Brother    Colon cancer Brother    Heart disease Brother    Heart disease Brother    Heart disease Brother    Heart disease Brother    Alzheimer's disease Brother    Cancer Brother    Diabetes Son    Anesthesia problems Neg Hx    Esophageal cancer Neg Hx    Rectal cancer Neg Hx    Stomach cancer Neg Hx     SOCIAL HISTORY: Social History   Socioeconomic History   Marital status: Married    Spouse name: Consuelo   Number of children: 2   Years of education: GED   Highest education level: Some college, no degree  Occupational History   Occupation: Retired     Comment: Retail Buyer   Occupation: Retired     Comment: [employer]  Tobacco Use   Smoking status: Former    Current packs/day: 0.00    Average packs/day: 1 pack/day for 21.7 years (21.7 ttl pk-yrs)    Types: Cigarettes    Start date: 12/12/1962    Quit date: 08/25/1984    Years since quitting: 39.4   Smokeless tobacco: Never   Tobacco comments:    Former smoker 09/21/23  Vaping Use   Vaping status: Never Used  Substance and Sexual Activity    Alcohol use: Not Currently   Drug use: Never   Sexual activity: Not Currently  Other Topics Concern   Not on file  Social History Narrative   Right handed   Decaf coffee (2-3 per day)   Lives with wife   Social Drivers of Health   Tobacco Use: Medium Risk (01/13/2024)   Patient History    Smoking Tobacco Use: Former    Smokeless Tobacco Use: Never    Passive Exposure: Not on Actuary Strain: Low Risk (01/10/2024)   Overall Financial Resource Strain (CARDIA)    Difficulty of Paying Living Expenses: Not hard at all  Food Insecurity: No Food Insecurity (01/10/2024)   Epic    Worried About Programme Researcher, Broadcasting/film/video in the Last Year: Never true    Ran Out of Food in the Last Year: Never true  Transportation Needs: No Transportation Needs (01/10/2024)   Epic    Lack of Transportation (Medical): No    Lack of Transportation (Non-Medical): No  Physical Activity: Insufficiently Active (07/28/2023)   Exercise Vital Sign    Days of Exercise per Week: 2 days    Minutes of Exercise per Session: 10 min  Stress: No Stress Concern Present (01/10/2024)   [employer] of Occupational Health - Occupational Stress Questionnaire    Feeling of Stress: Not at all  Social Connections: Moderately Integrated (01/10/2024)   Social Connection and Isolation Panel    Frequency of Communication with Friends and Family: More than three times a week    Frequency of Social Gatherings with Friends and Family: Once a week    Attends Religious Services: More than 4 times per year    Active Member of [employer] or Organizations: No    Attends Banker Meetings: Not on file    Marital Status: Married  Intimate Partner Violence: Not At Risk (04/19/2023)   Humiliation, Afraid, Rape, and Kick questionnaire    Fear of Current or  Ex-Partner: No    Emotionally Abused: No    Physically Abused: No    Sexually Abused: No  Depression (PHQ2-9): Low Risk (01/13/2024)   Depression (PHQ2-9)    PHQ-2  Score: 0  Alcohol Screen: Low Risk (04/19/2023)   Alcohol Screen    Last Alcohol Screening Score (AUDIT): 0  Housing: Low Risk (01/10/2024)   Epic    Unable to Pay for Housing in the Last Year: No    Number of Times Moved in the Last Year: 0    Homeless in the Last Year: No  Utilities: Not At Risk (04/19/2023)   AHC Utilities    Threatened with loss of utilities: No  Health Literacy: Adequate Health Literacy (04/19/2023)   B1300 Health Literacy    Frequency of need for help with medical instructions: Never       PHYSICAL EXAM  There were no vitals filed for this visit.  There is no height or weight on file to calculate BMI.   General: The patient is well-developed and well-nourished and in no acute distress  HEENT:  Head is [place]/AT.  Sclera are anicteric.  Funduscopic exam shows normal optic discs and retinal vessels.  Neck: No carotid bruits are noted.  The neck is nontender.  Cardiovascular: The heart has a regular rate and rhythm with a normal S1 and S2. There were no murmurs, gallops or rubs.    Skin: Extremities are without rash or  edema.  Musculoskeletal:  Back is nontender  Neurologic Exam  Mental status: The patient is alert and oriented x 3 at the time of the examination. The patient has apparent normal recent and remote memory, with an apparently normal attention span and concentration ability.   Speech is normal.  Cranial nerves: Extraocular movements are full. Pupils are equal, round, and reactive to light and accomodation.  Visual fields are full.  Facial symmetry is present. There is good facial sensation to soft touch bilaterally.Facial strength is normal.  Trapezius and sternocleidomastoid strength is normal. No dysarthria is noted.  The tongue is midline, and the patient has symmetric elevation of the soft palate. No obvious hearing deficits are noted.  Motor:  Muscle bulk is normal.   Tone is normal. Strength is  5 / 5 in all 4 extremities.   Sensory: Sensory  testing is intact to pinprick, soft touch and vibration sensation in all 4 extremities.  Coordination: Cerebellar testing reveals good finger-nose-finger and heel-to-shin bilaterally.  Gait and station: Station is normal.   Gait is normal. Tandem gait is normal. Romberg is negative.   Reflexes: Deep tendon reflexes are symmetric and normal bilaterally.   Plantar responses are flexor.    DIAGNOSTIC DATA (LABS, IMAGING, TESTING) - I reviewed patient records, labs, notes, testing and imaging myself where available.  Lab Results  Component Value Date   WBC 5.0 12/19/2023   HGB 13.9 12/19/2023   HCT 42.1 12/19/2023   MCV 92 12/19/2023   PLT 122 (L) 12/19/2023      Component Value Date/Time   NA 140 12/19/2023 0819   NA 139 04/14/2016 0943   K 4.1 12/19/2023 0819   K 4.6 04/14/2016 0943   CL 101 12/19/2023 0819   CL 104 03/24/2012 1024   CO2 26 12/19/2023 0819   CO2 25 04/14/2016 0943   GLUCOSE 241 (H) 12/19/2023 0819   GLUCOSE 185 (H) 09/21/2023 0856   GLUCOSE 111 04/14/2016 0943   GLUCOSE 80 03/24/2012 1024   BUN 15 12/19/2023 0819  BUN 8.6 04/14/2016 0943   CREATININE 1.05 12/19/2023 0819   CREATININE 0.99 09/21/2023 0856   CREATININE 1.1 04/14/2016 0943   CALCIUM  8.8 12/19/2023 0819   CALCIUM  9.4 04/14/2016 0943   PROT 5.5 (L) 12/19/2023 0819   PROT 6.5 04/14/2016 0943   ALBUMIN 4.1 12/19/2023 0819   ALBUMIN 3.8 04/14/2016 0943   AST 15 12/19/2023 0819   AST 16 09/21/2023 0856   AST 28 04/14/2016 0943   ALT 17 12/19/2023 0819   ALT 19 09/21/2023 0856   ALT 41 04/14/2016 0943   ALKPHOS 74 12/19/2023 0819   ALKPHOS 71 04/14/2016 0943   BILITOT 0.6 12/19/2023 0819   BILITOT 0.8 09/21/2023 0856   BILITOT 0.61 04/14/2016 0943   GFRNONAA >60 09/21/2023 0856   GFRAA 69 09/27/2019 0801   Lab Results  Component Value Date   CHOL 117 09/29/2023   HDL 55 09/29/2023   LDLCALC 39 09/29/2023   TRIG 134 09/29/2023   CHOLHDL 2.1 09/29/2023   Lab Results  Component  Value Date   HGBA1C 6.4 (H) 01/13/2024   Lab Results  Component Value Date   VITAMINB12 625 12/27/2019   Lab Results  Component Value Date   TSH 1.720 09/30/2020       ASSESSMENT AND PLAN  ***   Dakia Schifano A. Vear, MD, [facility] 01/29/2024, 5:01 PM Certified in Neurology, Clinical Neurophysiology, Sleep Medicine and Neuroimaging  [facility] [address] [place], [place] [address] [phone]    [1]  Allergies Allergen Reactions   Rituximab  Other (See Comments)    Throat tightness and facial flushing. Patient had hypersensitivty reaction to rapid rituximab . See progress note dated 10/21/2023  [2]  Current Outpatient Medications:    acetaminophen  (TYLENOL ) 500 MG tablet, Take 500-1,000 mg by mouth as needed for moderate pain (pain score 4-6)., Disp: , Rfl:    acyclovir  (ZOVIRAX ) 400 MG tablet, TAKE 1 TABLET(400 MG) BY MOUTH DAILY, Disp: 90 tablet, Rfl: 1   Ascorbic Acid (VITAMIN C) 1000 MG tablet, Take 1,000 mg by mouth daily., Disp: , Rfl:    brimonidine  (ALPHAGAN ) 0.2 % ophthalmic solution, Place 1 drop into both eyes 2 (two) times daily. , Disp: , Rfl:    Calcium  Carbonate-Vit D-Min (CALCIUM  1200) 1200-1000 MG-UNIT CHEW, Chew 1 tablet by mouth daily., Disp: , Rfl:    cyclobenzaprine  (FLEXERIL ) 5 MG tablet, Take 1 tablet (5 mg total) by mouth 3 (three) times daily as needed for muscle spasms., Disp: 30 tablet, Rfl: 0   ELIQUIS  5 MG TABS tablet, TAKE 1 TABLET TWICE A DAY, Disp: 180 tablet, Rfl: 3   finasteride  (PROSCAR ) 5 MG tablet, Take 1 tablet (5 mg total) by mouth daily., Disp: 90 tablet, Rfl: 1   flecainide  (TAMBOCOR ) 50 MG tablet, Take 1 tablet (50 mg total) by mouth 2 (two) times daily., Disp: 180 tablet, Rfl: 2   fluorouracil (EFUDEX) 5 % cream, Apply topically as needed., Disp: , Rfl:    lenalidomide  (REVLIMID ) 15 MG capsule, TAKE 1 CAPSULE DAILY FOR 21 DAYS ON, THEN 7 DAYS OFF. REPEAT EVERY 28 DAYS, Disp: 21 capsule, Rfl: 0   LUMIGAN  0.01 % SOLN, Place 1 drop into both eyes at bedtime., Disp: , Rfl:    meclizine  (ANTIVERT ) 25 MG tablet, Take 1 tablet (25 mg total) by mouth as needed for dizziness., Disp: 30 tablet, Rfl: 1   metFORMIN  (GLUCOPHAGE ) 500 MG tablet, Take 1 tablet (500 mg total) by mouth 2 (two) times daily with a  meal., Disp: 180 tablet, Rfl: 3   metoprolol  succinate (TOPROL -XL) 25 MG 24 hr tablet, Take 0.5 tablets (12.5 mg total) by mouth 2 (two) times daily. (May take an additional 1/2 tab as needed for palpitations, Disp: 135 tablet, Rfl: 1   Misc Natural Products (BEET ROOT PO), Take 1 Dose by mouth daily., Disp: , Rfl:    mometasone (ELOCON) 0.1 % cream, Apply 1 Application topically as needed., Disp: , Rfl:    Multiple Vitamin (MULTIVITAMIN WITH MINERALS) TABS tablet, Take 1 tablet by mouth daily. , Disp: , Rfl:    omeprazole  (PRILOSEC  OTC) 20 MG tablet, Take 20 mg by mouth every other day., Disp: , Rfl:    ondansetron  (ZOFRAN ) 8 MG tablet, Take 8 mg by mouth as needed for nausea or vomiting., Disp: , Rfl:    Polyethyl Glycol-Propyl Glycol (SYSTANE OP), Place 1 drop into both eyes daily as needed (dry eyes)., Disp: , Rfl:    prochlorperazine  (COMPAZINE ) 10 MG tablet, Take 10 mg by mouth as needed for nausea or vomiting., Disp: , Rfl:    rosuvastatin  (CRESTOR ) 20 MG tablet, TAKE 1 TABLET(20 MG) BY MOUTH DAILY, Disp: 90 tablet, Rfl: 1   traMADol  (ULTRAM ) 50 MG tablet, Take 2 tablets (100 mg total) by mouth every 12 (twelve) hours as needed., Disp: 60 tablet, Rfl: 0  "

## 2024-01-31 ENCOUNTER — Encounter: Payer: Self-pay | Admitting: Neurology

## 2024-01-31 ENCOUNTER — Ambulatory Visit: Admitting: Neurology

## 2024-01-31 VITALS — BP 110/68 | HR 68 | Ht 73.0 in | Wt 186.0 lb

## 2024-01-31 DIAGNOSIS — H8101 Meniere's disease, right ear: Secondary | ICD-10-CM

## 2024-01-31 DIAGNOSIS — R42 Dizziness and giddiness: Secondary | ICD-10-CM | POA: Diagnosis not present

## 2024-01-31 DIAGNOSIS — C8290 Follicular lymphoma, unspecified, unspecified site: Secondary | ICD-10-CM | POA: Diagnosis not present

## 2024-01-31 DIAGNOSIS — I480 Paroxysmal atrial fibrillation: Secondary | ICD-10-CM | POA: Diagnosis not present

## 2024-02-09 ENCOUNTER — Other Ambulatory Visit: Payer: Self-pay | Admitting: Cardiology

## 2024-02-17 ENCOUNTER — Other Ambulatory Visit: Payer: Self-pay | Admitting: Hematology and Oncology

## 2024-02-17 NOTE — Telephone Encounter (Signed)
Pls refill electronically °

## 2024-03-20 ENCOUNTER — Ambulatory Visit (HOSPITAL_COMMUNITY): Admitting: Internal Medicine

## 2024-03-23 ENCOUNTER — Ambulatory Visit: Payer: Self-pay | Admitting: Family Medicine

## 2024-03-27 ENCOUNTER — Inpatient Hospital Stay

## 2024-03-27 ENCOUNTER — Inpatient Hospital Stay: Admitting: Hematology and Oncology

## 2024-03-28 ENCOUNTER — Encounter: Admitting: Family Medicine

## 2024-04-19 ENCOUNTER — Ambulatory Visit: Payer: Self-pay

## 2024-04-19 ENCOUNTER — Ambulatory Visit: Admitting: Cardiology

## 2024-06-27 ENCOUNTER — Ambulatory Visit: Admitting: Urology
